# Patient Record
Sex: Female | Born: 1950 | Race: White | Hispanic: No | Smoking: Former smoker
Health system: Southern US, Community
[De-identification: ages and names within clinical notes are randomized; demographics above are authoritative.]

## PROBLEM LIST (undated history)

## (undated) DIAGNOSIS — I2510 Atherosclerotic heart disease of native coronary artery without angina pectoris: Secondary | ICD-10-CM

## (undated) DIAGNOSIS — I480 Paroxysmal atrial fibrillation: Secondary | ICD-10-CM

## (undated) DIAGNOSIS — I219 Acute myocardial infarction, unspecified: Secondary | ICD-10-CM

## (undated) DIAGNOSIS — G4733 Obstructive sleep apnea (adult) (pediatric): Secondary | ICD-10-CM

## (undated) DIAGNOSIS — R7302 Impaired glucose tolerance (oral): Secondary | ICD-10-CM

## (undated) DIAGNOSIS — I739 Peripheral vascular disease, unspecified: Secondary | ICD-10-CM

## (undated) DIAGNOSIS — M545 Low back pain, unspecified: Secondary | ICD-10-CM

## (undated) DIAGNOSIS — G8929 Other chronic pain: Secondary | ICD-10-CM

## (undated) DIAGNOSIS — R519 Headache, unspecified: Secondary | ICD-10-CM

## (undated) DIAGNOSIS — I82409 Acute embolism and thrombosis of unspecified deep veins of unspecified lower extremity: Secondary | ICD-10-CM

## (undated) DIAGNOSIS — J45909 Unspecified asthma, uncomplicated: Secondary | ICD-10-CM

## (undated) DIAGNOSIS — G629 Polyneuropathy, unspecified: Secondary | ICD-10-CM

## (undated) DIAGNOSIS — M549 Dorsalgia, unspecified: Secondary | ICD-10-CM

## (undated) DIAGNOSIS — R51 Headache: Secondary | ICD-10-CM

## (undated) DIAGNOSIS — I10 Essential (primary) hypertension: Secondary | ICD-10-CM

## (undated) DIAGNOSIS — R0602 Shortness of breath: Secondary | ICD-10-CM

## (undated) DIAGNOSIS — J449 Chronic obstructive pulmonary disease, unspecified: Secondary | ICD-10-CM

## (undated) DIAGNOSIS — M79606 Pain in leg, unspecified: Secondary | ICD-10-CM

## (undated) DIAGNOSIS — F32A Depression, unspecified: Secondary | ICD-10-CM

## (undated) DIAGNOSIS — W19XXXA Unspecified fall, initial encounter: Secondary | ICD-10-CM

## (undated) DIAGNOSIS — F329 Major depressive disorder, single episode, unspecified: Secondary | ICD-10-CM

## (undated) DIAGNOSIS — I209 Angina pectoris, unspecified: Secondary | ICD-10-CM

## (undated) DIAGNOSIS — Z9989 Dependence on other enabling machines and devices: Secondary | ICD-10-CM

## (undated) DIAGNOSIS — I5042 Chronic combined systolic (congestive) and diastolic (congestive) heart failure: Secondary | ICD-10-CM

## (undated) DIAGNOSIS — D649 Anemia, unspecified: Secondary | ICD-10-CM

## (undated) DIAGNOSIS — Z973 Presence of spectacles and contact lenses: Secondary | ICD-10-CM

## (undated) DIAGNOSIS — J189 Pneumonia, unspecified organism: Secondary | ICD-10-CM

## (undated) DIAGNOSIS — K219 Gastro-esophageal reflux disease without esophagitis: Secondary | ICD-10-CM

## (undated) DIAGNOSIS — E785 Hyperlipidemia, unspecified: Secondary | ICD-10-CM

## (undated) DIAGNOSIS — F419 Anxiety disorder, unspecified: Secondary | ICD-10-CM

## (undated) DIAGNOSIS — M1990 Unspecified osteoarthritis, unspecified site: Secondary | ICD-10-CM

## (undated) HISTORY — PX: JOINT REPLACEMENT: SHX530

## (undated) HISTORY — PX: FINGER SURGERY: SHX640

## (undated) HISTORY — PX: BACK SURGERY: SHX140

## (undated) HISTORY — PX: THROMBECTOMY / EMBOLECTOMY FEMORAL ARTERY: SUR1353

## (undated) HISTORY — PX: FRACTURE SURGERY: SHX138

## (undated) HISTORY — DX: Chronic combined systolic (congestive) and diastolic (congestive) heart failure: I50.42

## (undated) HISTORY — PX: CORONARY ANGIOPLASTY WITH STENT PLACEMENT: SHX49

---

## 1989-03-07 HISTORY — PX: ANTERIOR CERVICAL DECOMP/DISCECTOMY FUSION: SHX1161

## 1990-03-07 HISTORY — PX: POSTERIOR FUSION CERVICAL SPINE: SUR628

## 1996-03-07 HISTORY — PX: CORONARY ARTERY BYPASS GRAFT: SHX141

## 1996-03-07 HISTORY — PX: KNEE ARTHROSCOPY: SUR90

## 1997-03-07 DIAGNOSIS — I219 Acute myocardial infarction, unspecified: Secondary | ICD-10-CM

## 1997-03-07 HISTORY — DX: Acute myocardial infarction, unspecified: I21.9

## 1997-06-29 ENCOUNTER — Emergency Department (HOSPITAL_COMMUNITY): Admission: EM | Admit: 1997-06-29 | Discharge: 1997-06-29 | Payer: Self-pay | Admitting: Emergency Medicine

## 1998-01-27 ENCOUNTER — Encounter: Payer: Self-pay | Admitting: Vascular Surgery

## 1998-01-27 ENCOUNTER — Inpatient Hospital Stay: Admission: EM | Admit: 1998-01-27 | Discharge: 1998-01-30 | Payer: Self-pay | Admitting: Emergency Medicine

## 1998-01-28 ENCOUNTER — Encounter: Payer: Self-pay | Admitting: Vascular Surgery

## 1998-06-29 ENCOUNTER — Emergency Department (HOSPITAL_COMMUNITY): Admission: EM | Admit: 1998-06-29 | Discharge: 1998-06-29 | Payer: Self-pay | Admitting: *Deleted

## 1998-09-11 ENCOUNTER — Emergency Department (HOSPITAL_COMMUNITY): Admission: EM | Admit: 1998-09-11 | Discharge: 1998-09-11 | Payer: Self-pay | Admitting: Emergency Medicine

## 1998-09-11 ENCOUNTER — Encounter: Payer: Self-pay | Admitting: Emergency Medicine

## 1998-10-26 ENCOUNTER — Encounter: Payer: Self-pay | Admitting: Emergency Medicine

## 1998-10-26 ENCOUNTER — Emergency Department (HOSPITAL_COMMUNITY): Admission: EM | Admit: 1998-10-26 | Discharge: 1998-10-26 | Payer: Self-pay | Admitting: Emergency Medicine

## 1999-07-31 ENCOUNTER — Emergency Department (HOSPITAL_COMMUNITY): Admission: EM | Admit: 1999-07-31 | Discharge: 1999-07-31 | Payer: Self-pay | Admitting: Emergency Medicine

## 2000-03-08 ENCOUNTER — Inpatient Hospital Stay (HOSPITAL_COMMUNITY): Admission: EM | Admit: 2000-03-08 | Discharge: 2000-03-09 | Payer: Self-pay

## 2000-03-08 ENCOUNTER — Encounter: Payer: Self-pay | Admitting: Cardiology

## 2000-03-09 HISTORY — PX: CARDIAC CATHETERIZATION: SHX172

## 2000-05-09 ENCOUNTER — Emergency Department (HOSPITAL_COMMUNITY): Admission: EM | Admit: 2000-05-09 | Discharge: 2000-05-09 | Payer: Self-pay | Admitting: Emergency Medicine

## 2000-06-23 ENCOUNTER — Ambulatory Visit (HOSPITAL_COMMUNITY): Admission: RE | Admit: 2000-06-23 | Discharge: 2000-06-23 | Payer: Self-pay | Admitting: Family Medicine

## 2000-06-29 ENCOUNTER — Encounter: Payer: Self-pay | Admitting: Family Medicine

## 2000-06-29 ENCOUNTER — Encounter: Admission: RE | Admit: 2000-06-29 | Discharge: 2000-06-29 | Payer: Self-pay | Admitting: Family Medicine

## 2000-12-19 ENCOUNTER — Encounter: Admission: RE | Admit: 2000-12-19 | Discharge: 2000-12-19 | Payer: Self-pay | Admitting: Family Medicine

## 2000-12-19 ENCOUNTER — Encounter: Payer: Self-pay | Admitting: Family Medicine

## 2000-12-27 ENCOUNTER — Emergency Department (HOSPITAL_COMMUNITY): Admission: EM | Admit: 2000-12-27 | Discharge: 2000-12-28 | Payer: Self-pay | Admitting: Emergency Medicine

## 2001-01-26 ENCOUNTER — Ambulatory Visit (HOSPITAL_COMMUNITY): Admission: RE | Admit: 2001-01-26 | Discharge: 2001-01-26 | Payer: Self-pay | Admitting: *Deleted

## 2001-01-26 ENCOUNTER — Encounter: Payer: Self-pay | Admitting: *Deleted

## 2001-07-09 ENCOUNTER — Encounter: Payer: Self-pay | Admitting: Emergency Medicine

## 2001-07-09 ENCOUNTER — Emergency Department (HOSPITAL_COMMUNITY): Admission: EM | Admit: 2001-07-09 | Discharge: 2001-07-09 | Payer: Self-pay | Admitting: Emergency Medicine

## 2001-07-10 ENCOUNTER — Emergency Department (HOSPITAL_COMMUNITY): Admission: EM | Admit: 2001-07-10 | Discharge: 2001-07-10 | Payer: Self-pay | Admitting: Emergency Medicine

## 2001-07-10 ENCOUNTER — Encounter: Payer: Self-pay | Admitting: Emergency Medicine

## 2001-07-17 ENCOUNTER — Encounter: Admission: RE | Admit: 2001-07-17 | Discharge: 2001-07-17 | Payer: Self-pay | Admitting: Family Medicine

## 2001-07-17 ENCOUNTER — Encounter: Payer: Self-pay | Admitting: Family Medicine

## 2002-02-02 ENCOUNTER — Encounter: Payer: Self-pay | Admitting: Emergency Medicine

## 2002-02-02 ENCOUNTER — Emergency Department (HOSPITAL_COMMUNITY): Admission: EM | Admit: 2002-02-02 | Discharge: 2002-02-02 | Payer: Self-pay | Admitting: Emergency Medicine

## 2002-02-03 ENCOUNTER — Emergency Department (HOSPITAL_COMMUNITY): Admission: EM | Admit: 2002-02-03 | Discharge: 2002-02-03 | Payer: Self-pay | Admitting: Emergency Medicine

## 2002-11-02 ENCOUNTER — Emergency Department (HOSPITAL_COMMUNITY): Admission: EM | Admit: 2002-11-02 | Discharge: 2002-11-02 | Payer: Self-pay | Admitting: Emergency Medicine

## 2002-11-02 ENCOUNTER — Encounter: Payer: Self-pay | Admitting: Emergency Medicine

## 2004-10-27 ENCOUNTER — Emergency Department (HOSPITAL_COMMUNITY): Admission: EM | Admit: 2004-10-27 | Discharge: 2004-10-27 | Payer: Self-pay | Admitting: Family Medicine

## 2005-09-24 ENCOUNTER — Emergency Department (HOSPITAL_COMMUNITY): Admission: AD | Admit: 2005-09-24 | Discharge: 2005-09-24 | Payer: Self-pay | Admitting: Family Medicine

## 2008-09-01 ENCOUNTER — Emergency Department (HOSPITAL_COMMUNITY): Admission: EM | Admit: 2008-09-01 | Discharge: 2008-09-01 | Payer: Self-pay | Admitting: Family Medicine

## 2010-07-23 NOTE — Cardiovascular Report (Signed)
[place]. [facility]  Patient:    Wendy [name], Wendy [name]                     MRN: [id number] Proc. Date: 03/09/00 Adm. Date:  [id number] Attending:  Ophelia Shoulder CC:         Madaline Savage, M.D.   Cardiac Catheterization  PROCEDURES: 1. Left heart catheterization. 2. Coronary angiography. 3. Left ventriculogram. 4. Saphenous vein grafts. 5. Left internal mammary angiography. 6. Ascending aortography.  COMPLICATIONS:  None.  INDICATIONS:  Ms. Panameno is a 60 year old white female, patient of Dr. Lavonne Chick, with a history of extensive CAD, status post coronary artery bypass graft surgery in July of 1998, consisting of a LIMA to LAD, vein graft to OM, and vein graft to distal RCA.  The patient has a history of extensive tobacco abuse and polysubstance abuse.  She was readmitted with chest pain. She is now referred for repeat cardiac catheterization.  DESCRIPTION OF PROCEDURE:  After given informed written consent, the patient was brought to the cardiac catheterization lab where her right and left groins were shaved, prepped, and draped in the usual sterile fashion.  ECG monitoring was established.  Using modified Seldinger technique, a #6 French arterial sheath was inserted into the left femoral artery.  A 6 French diagnostic catheter was then used to perform diagnostic angiography.  This reveals medium sized left main with no significant disease.  The LAD is a medium sized vessel which coursed to the apex and gave rise to one diagonal branch.  The LAD is noted to be coarsely irregular throughout its proximal and mid segments with up to 50% proximal stenosis.  There was a good antegrade flow down the LAD in the diagonal.  The mid and distal LAD also fill via a small but patent LIMA which inserts into the midportion of the LAD. There is 50% ostial lesion in the takeoff of the IMA from the left subclavian. The first diagonal is a medium sized  vessel which bifurcates in its mid segment and has no significant disease.  The left circumflex is a medium sized vessel which coursed in the AV groove and gave rise to two obtuse marginal branches.  The AV groove circumflex is totally occluded in its distal portion.  The first and third OM were small vessel with no significant disease.  The second OM fills a patent saphenous vein graft which has no significant disease in the body of the graft or distal to the graft insertion.  The right coronary artery is filled via patent saphenous vein graft to the distal RCA.  There is 30% proximal vein graft lesion but no further significant disease in the body of the graft or distal to the graft insertion. The RCA fills retrograde into its mid and proximal portion with diffuse disease.  The RCA proper is not directly cannulized as it has aberrant takeoff from the left coronary cusp.  LEFT VENTRICULOGRAM:  Left ventriculogram reveals preserved EF estimated at 70%.  ASCENDING AORTOGRAPHY:  Ascending aortography reveals no evidence of significant aortic regurgitation.  There appear to be two patent grafts.  The RCA is nonselectively visualized.  HEMODYNAMICS:  Systemic arterial pressure 157/80, LV systemic pressure 157/18, LVEDP of 22.  CONCLUSIONS: 1. Significant two-vessel coronary artery disease. 2. Patent left internal mammary artery to the left anterior descending with a    50% ostial internal mammary artery lesion off the left subclavian.  The    internal  mammary artery is a small vessel but does fill the left anterior    descending. 3. Patent saphenous vein graft to second obtuse marginal. 4. Patent saphenous vein graft to right coronary artery. 5. Normal left ventricular systolic function. 6. No evidence of aortic regurgitation. DD:  03/09/00 TD:  03/09/00 Job: [id number]

## 2010-07-23 NOTE — Discharge Summary (Signed)
[place]. [facility]  Patient:    DANAYE, SOBH                     MRN: [id number] Adm. Date:  [id number] Disc. Date: [id number] Attending:  Ophelia Shoulder Dictator:   Mancel Bale, P.A. CC:         Larina Earthly, M.D.   Discharge Summary  ADMISSION DIAGNOSES: 1. Chest pain, questionable etiology. 2. Coronary artery disease with history of coronary artery bypass grafting    July 1998. 3. Peripheral vascular disease.    a. Status post acute occlusion right iliac.    b. Status post right femoral thrombectomy.    c. History of thin bypass graft. 4. History of heavy tobacco use. 5. History of elicit drug abuse. 6. History of seizure disorder. 7. History of personality disorder.  DISCHARGE DIAGNOSES: 1. Status post cardiac catheterization March 09, 2000 by Dr. Lenise Herald    revealing an ejection fraction of 70%, significant native coronary artery    disease but essentially patent bypass graft for medical management. 2. Chest pain, questionable etiology. 3. Coronary artery disease with history of coronary artery bypass grafting    July 1998. 4. Peripheral vascular disease.    a. Status post acute occlusion right iliac.    b. Status post right femoral thrombectomy.    c. History of thin bypass graft. 5. History of heavy tobacco use. 6. History of elicit drug abuse. 7. History of seizure disorder. 8. History of personality disorder.  HISTORY OF PRESENT ILLNESS: Ms. Musquiz is a 60 year old white female with a history of coronary artery disease status post coronary artery bypass grafting July 1998, peripheral vascular disease status post acute occlusion right iliac, status post right femoral thrombectomy, and a history of fem fem bypass grafting who presented on [facility] Room on March 08, 2000 with complaints of chest pain. She was a remote patient of Dr. Elsie Lincoln but had seen no cardiology followup and no primary care  followup and was on no medications at the time of presentation.  She stated that she woke up at 3:00 a.m. with chest pain and then went back to sleep but woke again at which time she had no chest pain.  However, 30 minutes after waking back up, the chest pain returned.  It was described as a tightness in her left chest.  On exam at that time, her exam was essentially benign and she was stable with a heart rate of 75, blood pressure 117/51.  EKG showed normal sinus rhythm, possible old anterior MI, nonspecific ST abnormalities.  Initial CK was 56, troponin 0.01 which was negative.  At that time, it was felt that she was experiencing chest pain that may be cardiac in etiology.  Will plan to admit her to Telemetry, rule out MI with serial enzymes, place her on aspirin, Plavix, IV Heparin, and IV Nitroglycerin as well as metoprolol 12.5 mg b.i.d.  We will plan for cardiac catheterization the following day.  HOSPITAL COURSE: On March 09, 2000, Ms. Mazo underwent cardiac catheterization by Dr. Lenise Herald.  She was found to have significant two vessel coronary artery disease.  Patent LIMA to the LAD with a 50% osteal LIMA lesion off the left subclavian.  The internal mammary artery was small vessel but does fill to the left anterior descending. Patent saphenous vein graft to second obtuse marginal.  Pace at patent saphenous vein graft to right coronary artery.  Normal  left ventricular systolic function.  No evidence of aortic regurgitation.  She was planned for medical management and discharge home later that evening if her groin was stable.  She was later discharged home later that evening as her groin was stable and she was hemodynamically stable.  HOSPITAL CONSULTS: None.  HOSPITAL PROCEDURES: Cardiac catheterization on March 09, 2000 by Dr. Lenise Herald revealing: 1. Significant two vessel coronary artery disease. 2. Patent left internal mammary artery to the left anterior  descending with    a 50% osteal internal mammary artery lesion off the left subclavian.    The internal mammary artery is a small vessel but does fill the    left anterior descending. 3. Patent saphenous vein graft to second obtuse marginal. 4. Patent saphenous vein graft to right coronary artery. 5. Normal left ventricular systolic function. 6. No evidence of aortic regurgitation.  HOSPITAL LABORATORIES:  Alcohol level was less than 10.  Cardiac enzymes with left CKF 56, 53, and 50.  MB 1.0, 0.7, 0.7.  Troponin is 0.01 x 3.  On admission, PT 12.1, INR 0.9, PTT 28.  Metabolic profile is normal with sodium 140, potassium 3.9, glucose 80, BUN 15, creatinine 0.6, LFTs normal with AST 19, ALT 17, ALP 63, total bilirubin 0.6.  CBC normal with WBC 6.4, hemoglobin 12.6, hematocrit 38.3, platelets 223,000.  ACCESSORY DATA:  EKG on admission showed normal sinus rhythm, possible old anterior MI, nonspecific ST-T abnormalities.  DISCHARGE MEDICATIONS: 1. Enteric coated aspirin 325 mg once a day. 2. Atenolol 25 mg once a day. 3. Nitroglycerin 0.4 mg sublingual as directed.  ACTIVITY:  No lifting greater than five pounds, no traveling or sexual activity for three days.  DIET:  Low salt, low fat.  WOUND CARE:  Wash with warm water and soap.  DISCHARGE INSTRUCTIONS:  Call our office at [phone] if any bleeding or increased size or pain of the groin.  FOLLOWUP:  She was given an appointment to follow-up with Dr. Elsie Lincoln in the office January 17 at 9:00 a.m. DD:  03/20/00 TD:  03/20/00 Job: [id number]

## 2013-07-19 ENCOUNTER — Encounter (HOSPITAL_COMMUNITY): Payer: Self-pay | Admitting: Emergency Medicine

## 2013-07-19 ENCOUNTER — Emergency Department (HOSPITAL_COMMUNITY): Payer: Medicare Other

## 2013-07-19 ENCOUNTER — Inpatient Hospital Stay (HOSPITAL_COMMUNITY)
Admission: EM | Admit: 2013-07-19 | Discharge: 2013-07-20 | DRG: 287 | Disposition: A | Discharge status: Home / Self Care | Payer: Medicare Other | Attending: Cardiology | Admitting: Cardiology

## 2013-07-19 ENCOUNTER — Encounter (HOSPITAL_COMMUNITY)
Admission: EM | Disposition: A | Discharge status: Home / Self Care | Payer: Self-pay | Source: Home / Self Care | Attending: Cardiology

## 2013-07-19 DIAGNOSIS — I739 Peripheral vascular disease, unspecified: Secondary | ICD-10-CM | POA: Diagnosis present

## 2013-07-19 DIAGNOSIS — I2510 Atherosclerotic heart disease of native coronary artery without angina pectoris: Principal | ICD-10-CM | POA: Diagnosis present

## 2013-07-19 DIAGNOSIS — E785 Hyperlipidemia, unspecified: Secondary | ICD-10-CM | POA: Diagnosis present

## 2013-07-19 DIAGNOSIS — R7309 Other abnormal glucose: Secondary | ICD-10-CM | POA: Diagnosis present

## 2013-07-19 DIAGNOSIS — Z9861 Coronary angioplasty status: Secondary | ICD-10-CM

## 2013-07-19 DIAGNOSIS — F410 Panic disorder [episodic paroxysmal anxiety] without agoraphobia: Secondary | ICD-10-CM | POA: Diagnosis present

## 2013-07-19 DIAGNOSIS — R079 Chest pain, unspecified: Secondary | ICD-10-CM

## 2013-07-19 DIAGNOSIS — I10 Essential (primary) hypertension: Secondary | ICD-10-CM | POA: Diagnosis present

## 2013-07-19 DIAGNOSIS — Z888 Allergy status to other drugs, medicaments and biological substances status: Secondary | ICD-10-CM

## 2013-07-19 DIAGNOSIS — Z951 Presence of aortocoronary bypass graft: Secondary | ICD-10-CM

## 2013-07-19 DIAGNOSIS — G8929 Other chronic pain: Secondary | ICD-10-CM | POA: Diagnosis present

## 2013-07-19 DIAGNOSIS — E669 Obesity, unspecified: Secondary | ICD-10-CM

## 2013-07-19 DIAGNOSIS — Z7982 Long term (current) use of aspirin: Secondary | ICD-10-CM

## 2013-07-19 DIAGNOSIS — I200 Unstable angina: Secondary | ICD-10-CM | POA: Diagnosis present

## 2013-07-19 DIAGNOSIS — I2582 Chronic total occlusion of coronary artery: Secondary | ICD-10-CM | POA: Diagnosis present

## 2013-07-19 DIAGNOSIS — J449 Chronic obstructive pulmonary disease, unspecified: Secondary | ICD-10-CM | POA: Diagnosis present

## 2013-07-19 DIAGNOSIS — J4489 Other specified chronic obstructive pulmonary disease: Secondary | ICD-10-CM | POA: Diagnosis present

## 2013-07-19 DIAGNOSIS — Z6838 Body mass index (BMI) 38.0-38.9, adult: Secondary | ICD-10-CM

## 2013-07-19 DIAGNOSIS — Z7902 Long term (current) use of antithrombotics/antiplatelets: Secondary | ICD-10-CM

## 2013-07-19 DIAGNOSIS — S31109A Unspecified open wound of abdominal wall, unspecified quadrant without penetration into peritoneal cavity, initial encounter: Secondary | ICD-10-CM

## 2013-07-19 DIAGNOSIS — Z881 Allergy status to other antibiotic agents status: Secondary | ICD-10-CM

## 2013-07-19 DIAGNOSIS — Z87891 Personal history of nicotine dependence: Secondary | ICD-10-CM

## 2013-07-19 DIAGNOSIS — M79609 Pain in unspecified limb: Secondary | ICD-10-CM | POA: Diagnosis present

## 2013-07-19 DIAGNOSIS — M549 Dorsalgia, unspecified: Secondary | ICD-10-CM | POA: Diagnosis present

## 2013-07-19 DIAGNOSIS — G609 Hereditary and idiopathic neuropathy, unspecified: Secondary | ICD-10-CM | POA: Diagnosis present

## 2013-07-19 HISTORY — DX: Hyperlipidemia, unspecified: E78.5

## 2013-07-19 HISTORY — DX: Other chronic pain: G89.29

## 2013-07-19 HISTORY — PX: LEFT HEART CATHETERIZATION WITH CORONARY/GRAFT ANGIOGRAM: SHX5450

## 2013-07-19 HISTORY — DX: Essential (primary) hypertension: I10

## 2013-07-19 HISTORY — DX: Dorsalgia, unspecified: M54.9

## 2013-07-19 HISTORY — DX: Polyneuropathy, unspecified: G62.9

## 2013-07-19 HISTORY — DX: Pain in leg, unspecified: M79.606

## 2013-07-19 HISTORY — DX: Chronic obstructive pulmonary disease, unspecified: J44.9

## 2013-07-19 HISTORY — DX: Impaired glucose tolerance (oral): R73.02

## 2013-07-19 HISTORY — DX: Atherosclerotic heart disease of native coronary artery without angina pectoris: I25.10

## 2013-07-19 HISTORY — DX: Peripheral vascular disease, unspecified: I73.9

## 2013-07-19 LAB — CBC
HCT: 39.5 % (ref 36.0–46.0)
Hemoglobin: 12.9 g/dL (ref 12.0–15.0)
MCH: 30.6 pg (ref 26.0–34.0)
MCHC: 32.7 g/dL (ref 30.0–36.0)
MCV: 93.6 fL (ref 78.0–100.0)
Platelets: 165 10*3/uL (ref 150–400)
RBC: 4.22 MIL/uL (ref 3.87–5.11)
RDW: 14.6 % (ref 11.5–15.5)
WBC: 5.7 10*3/uL (ref 4.0–10.5)

## 2013-07-19 LAB — URINALYSIS, ROUTINE W REFLEX MICROSCOPIC
Bilirubin Urine: NEGATIVE
Glucose, UA: NEGATIVE mg/dL
Hgb urine dipstick: NEGATIVE
Ketones, ur: NEGATIVE mg/dL
Nitrite: POSITIVE — AB
Protein, ur: NEGATIVE mg/dL
Specific Gravity, Urine: 1.024 (ref 1.005–1.030)
Urobilinogen, UA: 0.2 mg/dL (ref 0.0–1.0)
pH: 5.5 (ref 5.0–8.0)

## 2013-07-19 LAB — COMPREHENSIVE METABOLIC PANEL
ALT: 36 U/L — ABNORMAL HIGH (ref 0–35)
AST: 34 U/L (ref 0–37)
Albumin: 3.5 g/dL (ref 3.5–5.2)
Alkaline Phosphatase: 62 U/L (ref 39–117)
BUN: 18 mg/dL (ref 6–23)
CO2: 26 mEq/L (ref 19–32)
Calcium: 9.4 mg/dL (ref 8.4–10.5)
Chloride: 103 mEq/L (ref 96–112)
Creatinine, Ser: 0.92 mg/dL (ref 0.50–1.10)
GFR calc Af Amer: 76 mL/min — ABNORMAL LOW (ref >90)
GFR calc non Af Amer: 66 mL/min — ABNORMAL LOW (ref >90)
Glucose, Bld: 109 mg/dL — ABNORMAL HIGH (ref 70–99)
Potassium: 4.2 mEq/L (ref 3.7–5.3)
Sodium: 141 mEq/L (ref 137–147)
Total Bilirubin: <0.2 mg/dL — ABNORMAL LOW (ref 0.3–1.2)
Total Protein: 6.8 g/dL (ref 6.0–8.3)

## 2013-07-19 LAB — PROTIME-INR
INR: 0.92 (ref 0.00–1.49)
Prothrombin Time: 12.2 seconds (ref 11.6–15.2)

## 2013-07-19 LAB — PLATELET INHIBITION P2Y12: Platelet Function  P2Y12: 213 [PRU] (ref 194–418)

## 2013-07-19 LAB — TROPONIN I: Troponin I: <0.3 ng/mL (ref <0.30)

## 2013-07-19 LAB — URINE MICROSCOPIC-ADD ON

## 2013-07-19 LAB — APTT: aPTT: 27 seconds (ref 24–37)

## 2013-07-19 SURGERY — LEFT HEART CATHETERIZATION WITH CORONARY/GRAFT ANGIOGRAM
Anesthesia: LOCAL

## 2013-07-19 MED ORDER — NITROGLYCERIN 0.4 MG SL SUBL
0.4000 mg | SUBLINGUAL_TABLET | SUBLINGUAL | Status: DC | PRN
  Administered 2013-07-19 (×2): 0.4 mg via SUBLINGUAL

## 2013-07-19 MED ORDER — VERAPAMIL HCL 2.5 MG/ML IV SOLN
INTRAVENOUS | Status: AC
  Filled 2013-07-19: qty 2

## 2013-07-19 MED ORDER — FENTANYL CITRATE 0.05 MG/ML IJ SOLN
INTRAMUSCULAR | Status: AC
  Filled 2013-07-19: qty 2

## 2013-07-19 MED ORDER — MOMETASONE FURO-FORMOTEROL FUM 100-5 MCG/ACT IN AERO
2.0000 | INHALATION_SPRAY | Freq: Two times a day (BID) | RESPIRATORY_TRACT | Status: DC
  Administered 2013-07-20: 2 via RESPIRATORY_TRACT
  Filled 2013-07-19 (×2): qty 8.8

## 2013-07-19 MED ORDER — SODIUM CHLORIDE 0.9 % IV SOLN
20.0000 mL | INTRAVENOUS | Status: DC
  Administered 2013-07-19: 20 mL via INTRAVENOUS

## 2013-07-19 MED ORDER — NITROGLYCERIN 0.4 MG SL SUBL: 0.4000 mg | SUBLINGUAL_TABLET | SUBLINGUAL | Status: DC | PRN

## 2013-07-19 MED ORDER — MIDAZOLAM HCL 2 MG/2ML IJ SOLN
INTRAMUSCULAR | Status: AC
  Filled 2013-07-19: qty 2

## 2013-07-19 MED ORDER — OXYCODONE-ACETAMINOPHEN 5-325 MG PO TABS
1.0000 | ORAL_TABLET | Freq: Once | ORAL | Status: AC
  Administered 2013-07-19: 21:00:00 1 via ORAL
  Filled 2013-07-19: qty 1

## 2013-07-19 MED ORDER — ASPIRIN 81 MG PO CHEW
324.0000 mg | CHEWABLE_TABLET | Freq: Once | ORAL | Status: AC
  Administered 2013-07-19: 324 mg via ORAL
  Filled 2013-07-19: qty 4

## 2013-07-19 MED ORDER — ALPRAZOLAM 0.5 MG PO TABS: 0.5000 mg | ORAL_TABLET | Freq: Three times a day (TID) | ORAL | Status: DC | PRN

## 2013-07-19 MED ORDER — SODIUM CHLORIDE 0.9 % IJ SOLN: 3.0000 mL | Freq: Two times a day (BID) | INTRAMUSCULAR | Status: DC

## 2013-07-19 MED ORDER — METOPROLOL SUCCINATE ER 100 MG PO TB24
100.0000 mg | ORAL_TABLET | Freq: Two times a day (BID) | ORAL | Status: DC
  Administered 2013-07-20: 09:00:00 100 mg via ORAL
  Filled 2013-07-19 (×3): qty 1

## 2013-07-19 MED ORDER — SODIUM CHLORIDE 0.9 % IV SOLN: 250.0000 mL | INTRAVENOUS | Status: DC | PRN

## 2013-07-19 MED ORDER — CLOPIDOGREL BISULFATE 75 MG PO TABS
75.0000 mg | ORAL_TABLET | Freq: Every day | ORAL | Status: DC
  Administered 2013-07-20: 08:00:00 75 mg via ORAL
  Filled 2013-07-19: qty 1

## 2013-07-19 MED ORDER — HEPARIN SODIUM (PORCINE) 5000 UNIT/ML IJ SOLN
5000.0000 [IU] | Freq: Three times a day (TID) | INTRAMUSCULAR | Status: DC
  Administered 2013-07-19 – 2013-07-20 (×2): 5000 [IU] via SUBCUTANEOUS
  Filled 2013-07-19 (×5): qty 1

## 2013-07-19 MED ORDER — ASPIRIN 81 MG PO CHEW
81.0000 mg | CHEWABLE_TABLET | Freq: Every day | ORAL | Status: DC
  Administered 2013-07-20: 09:00:00 81 mg via ORAL
  Filled 2013-07-19: qty 1

## 2013-07-19 MED ORDER — NITROGLYCERIN IN D5W 200-5 MCG/ML-% IV SOLN
5.0000 ug/min | INTRAVENOUS | Status: DC
  Administered 2013-07-19: 10 ug/min via INTRAVENOUS
  Filled 2013-07-19: qty 250

## 2013-07-19 MED ORDER — SODIUM CHLORIDE 0.9 % IV SOLN
INTRAVENOUS | Status: DC
  Administered 2013-07-19: 18:00:00 via INTRAVENOUS

## 2013-07-19 MED ORDER — ONDANSETRON HCL 4 MG/2ML IJ SOLN: 4.0000 mg | Freq: Four times a day (QID) | INTRAMUSCULAR | Status: DC | PRN

## 2013-07-19 MED ORDER — HYDROCODONE-ACETAMINOPHEN 5-325 MG PO TABS: 1.0000 | ORAL_TABLET | Freq: Four times a day (QID) | ORAL | Status: DC | PRN

## 2013-07-19 MED ORDER — TRIAMTERENE-HCTZ 37.5-25 MG PO CAPS
1.0000 | ORAL_CAPSULE | Freq: Every day | ORAL | Status: DC
  Administered 2013-07-20: 09:00:00 1 via ORAL
  Filled 2013-07-19 (×2): qty 1

## 2013-07-19 MED ORDER — SODIUM CHLORIDE 0.9 % IJ SOLN: 3.0000 mL | INTRAMUSCULAR | Status: DC | PRN

## 2013-07-19 MED ORDER — SIMVASTATIN 20 MG PO TABS
20.0000 mg | ORAL_TABLET | Freq: Every day | ORAL | Status: DC
  Administered 2013-07-19 – 2013-07-20 (×2): 20 mg via ORAL
  Filled 2013-07-19 (×2): qty 1

## 2013-07-19 MED ORDER — ALPRAZOLAM 0.25 MG PO TABS: 0.2500 mg | ORAL_TABLET | Freq: Two times a day (BID) | ORAL | Status: DC | PRN

## 2013-07-19 MED ORDER — NITROGLYCERIN 0.2 MG/ML ON CALL CATH LAB
INTRAVENOUS | Status: AC
  Filled 2013-07-19: qty 1

## 2013-07-19 MED ORDER — HEPARIN (PORCINE) IN NACL 2-0.9 UNIT/ML-% IJ SOLN
INTRAMUSCULAR | Status: AC
  Filled 2013-07-19: qty 1500

## 2013-07-19 MED ORDER — ALBUTEROL SULFATE HFA 108 (90 BASE) MCG/ACT IN AERS: 2.0000 | INHALATION_SPRAY | Freq: Four times a day (QID) | RESPIRATORY_TRACT | Status: DC | PRN

## 2013-07-19 MED ORDER — ZOLPIDEM TARTRATE 5 MG PO TABS: 5.0000 mg | ORAL_TABLET | Freq: Every evening | ORAL | Status: DC | PRN

## 2013-07-19 MED ORDER — CLOPIDOGREL BISULFATE 75 MG PO TABS
75.0000 mg | ORAL_TABLET | Freq: Once | ORAL | Status: AC
  Administered 2013-07-19: 75 mg via ORAL
  Filled 2013-07-19: qty 1

## 2013-07-19 MED ORDER — SODIUM CHLORIDE 0.9 % IJ SOLN
3.0000 mL | Freq: Two times a day (BID) | INTRAMUSCULAR | Status: DC
  Administered 2013-07-19: 3 mL via INTRAVENOUS

## 2013-07-19 MED ORDER — ALBUTEROL SULFATE (2.5 MG/3ML) 0.083% IN NEBU: 2.5000 mg | INHALATION_SOLUTION | Freq: Four times a day (QID) | RESPIRATORY_TRACT | Status: DC | PRN

## 2013-07-19 MED ORDER — PANTOPRAZOLE SODIUM 40 MG PO TBEC
40.0000 mg | DELAYED_RELEASE_TABLET | Freq: Every day | ORAL | Status: DC
  Administered 2013-07-20: 09:00:00 40 mg via ORAL
  Filled 2013-07-19: qty 1

## 2013-07-19 MED ORDER — LIDOCAINE HCL (PF) 1 % IJ SOLN
INTRAMUSCULAR | Status: AC
  Filled 2013-07-19: qty 30

## 2013-07-19 MED ORDER — OMEGA-3-ACID ETHYL ESTERS 1 G PO CAPS
1.0000 g | ORAL_CAPSULE | Freq: Every day | ORAL | Status: DC
  Administered 2013-07-19 – 2013-07-20 (×2): 1 g via ORAL
  Filled 2013-07-19 (×2): qty 1

## 2013-07-19 MED ORDER — VITAMIN D3 25 MCG (1000 UNIT) PO TABS
1000.0000 [IU] | ORAL_TABLET | Freq: Every day | ORAL | Status: DC
  Administered 2013-07-19 – 2013-07-20 (×2): 1000 [IU] via ORAL
  Filled 2013-07-19 (×2): qty 1

## 2013-07-19 MED ORDER — EZETIMIBE 10 MG PO TABS
10.0000 mg | ORAL_TABLET | Freq: Every day | ORAL | Status: DC
  Administered 2013-07-19 – 2013-07-20 (×2): 10 mg via ORAL
  Filled 2013-07-19 (×2): qty 1

## 2013-07-19 MED ORDER — MORPHINE SULFATE 4 MG/ML IJ SOLN
4.0000 mg | Freq: Once | INTRAMUSCULAR | Status: AC
  Administered 2013-07-19: 4 mg via INTRAVENOUS
  Filled 2013-07-19: qty 1

## 2013-07-19 MED ORDER — ACETAMINOPHEN 325 MG PO TABS: 650.0000 mg | ORAL_TABLET | ORAL | Status: DC | PRN

## 2013-07-19 MED ORDER — FISH OIL + D3 1200-1000 MG-UNIT PO CAPS: 1.0000 | ORAL_CAPSULE | Freq: Every day | ORAL | Status: DC

## 2013-07-19 NOTE — ED Provider Notes (Signed)
CSN: [id number]     Arrival date & time 07/19/13  1110 History   First MD Initiated Contact with Patient 07/19/13 1111     Chief Complaint  Patient presents with  . Chest Pain     (Consider location/radiation/quality/duration/timing/severity/associated sxs/prior Treatment) HPI 63 year old female with a history of coronary artery disease status post stenting and CABG presents today complaining of chest pain that is like previous MI. She states the pain awoke her at 955 this morning. It was substernal and radiating to her back. She took 3 nitroglycerin at home with pain decreased from 7-3. Pain has been waxing and waning. She also took 325 mg grams of aspirin at home. She was given one sublingual nitroglycerin by EMS. She states that her pain has been down to 3 but feels like it is coming back now. She has had some nausea but no diaphoresis, cough, or dyspnea. Her previous care was rendered in [facility]. She is somewhat new to the area and has not started with primary care. She states she is scheduled to see Dr. Tamala Julian for cardiology.  Past Medical History  Diagnosis Date  . Diabetes mellitus without complication     borderline diabetes  . Hypertension   . PAD (peripheral artery disease)   . COPD (chronic obstructive pulmonary disease)   . Peripheral neuropathy   . Chronic back pain   . Chronic leg pain    Past Surgical History  Procedure Laterality Date  . Cardiac surgery      quadruple bypass in 1998   History reviewed. No pertinent family history. History  Substance Use Topics  . Smoking status: Former Smoker    Quit date: 07/20/1998  . Smokeless tobacco: Never Used  . Alcohol Use: No   OB History   Grav Para Term Preterm Abortions TAB SAB Ect Mult Living                 Review of Systems  Musculoskeletal:       Left arm pain from recent fracture  All other systems reviewed and are negative.     Allergies  Benadryl; Amoxicillin; and Flexeril  Home  Medications   Prior to Admission medications   Not on File   BP 116/55  Pulse 73  Temp(Src) 97.9 F (36.6 C) (Oral)  Resp 18  SpO2 95% Physical Exam  Nursing note and vitals reviewed. Constitutional: She is oriented to person, place, and time. She appears well-developed and well-nourished.  Obese  HENT:  Head: Normocephalic and atraumatic.  Right Ear: External ear normal.  Left Ear: External ear normal.  Nose: Nose normal.  Mouth/Throat: Oropharynx is clear and moist.  Eyes: Conjunctivae and EOM are normal. Pupils are equal, round, and reactive to light.  Neck: Normal range of motion. Neck supple.  Cardiovascular: Normal rate, regular rhythm, normal heart sounds and intact distal pulses.   Pulmonary/Chest: Effort normal and breath sounds normal.  Abdominal: Soft. Bowel sounds are normal.  Musculoskeletal: Normal range of motion.  Neurological: She is alert and oriented to person, place, and time. She has normal reflexes.  Skin: Skin is warm and dry.  Psychiatric: She has a normal mood and affect. Her behavior is normal. Thought content normal.    ED Course  Procedures (including critical care time) Labs Review Labs Reviewed  APTT  CBC  COMPREHENSIVE METABOLIC PANEL  PROTIME-INR  URINALYSIS, ROUTINE W REFLEX MICROSCOPIC    Imaging Review No results found.   EKG Interpretation   Date/Time:  Friday Jul 19 2013 11:18:16 EDT Ventricular Rate:  74 PR Interval:  96 QRS Duration: 84 QT Interval:  418 QTC Calculation: 464 R Axis:   122 Text Interpretation:  Right and left arm electrode reversal,  interpretation assumes no reversal Sinus or ectopic atrial rhythm Short PR  interval Right axis deviation Abnormal lateral Q waves Confirmed by Austen Wygant  MD, Mauro Arps ([id number]) on 07/19/2013 11:35:20 AM     Repeat ekg due to arm electrode reversal  EKG Interpretation  Date/Time:  Friday Jul 19 2013 11:35:29 EDT Ventricular Rate:  73 PR Interval:  93 QRS Duration: 88 QT  Interval:  397 QTC Calculation: 437 R Axis:   51 Text Interpretation:  Sinus rhythm Short PR interval Nonspecific T abnormalities, lateral leads Confirmed by Leroi Haque MD, Mitchell Iwanicki ([id number]) on 07/19/2013 11:37:56 AM       MDM   Final diagnoses:  Intermediate coronary syndrome  Chest pain with low risk of acute coronary syndrome    Patient given repeat nitroglycerin here and has again had some increase in her chest pain. She is started on a nitro drip and pain is currently 2/10. I discussed the patient's care with cardiology and they will see and evaluate. Patient currently does not have ST elevation on EKG, first troponin is normal, and vital signs remained stable. She does have a history of known coronary artery disease and has nonspecific changes on her EKG with ongoing chest pain.    Shaune Pollack, MD 07/20/13 [phone]

## 2013-07-19 NOTE — Interval H&P Note (Signed)
History and Physical Interval Note:  07/19/2013 3:55 PM  Wendy [name]  has presented today for surgery, with the diagnosis of Unstable Angina The various methods of treatment have been discussed with the patient and family. After consideration of risks, benefits and other options for treatment, the patient has consented to  Procedure(s): LEFT HEART CATHETERIZATION WITH CORONARY/GRAFT ANGIOGRAM (N/A)  +/- PCI as a surgical intervention .  The patient's history has been reviewed, patient examined, no change in status, stable for surgery.  I have reviewed the patient's chart and labs.  Questions were answered to the patient's satisfaction.     Wendy [name]  Cath Lab Visit (complete for each Cath Lab visit)  Clinical Evaluation Leading to the Procedure:   ACS: yes  Non-ACS:    Anginal Classification: CCS III  Anti-ischemic medical therapy: Maximal Therapy (2 or more classes of medications)  Non-Invasive Test Results: No non-invasive testing performed  Prior CABG: Previous CABG

## 2013-07-19 NOTE — ED Notes (Signed)
[facility] presents with a 63 yo female from home with CP that radiates to back.  Pt has previous cardiac hx including 2 stints in 2013 and quadruple bypass in 1998.  Pt was awaken to CP this morning at 9:55 am and took 1 NTG, pt subsequently took 2 additional NTG.  [facility] gave 1 NTG in route to this facility.  Pt also took 325 ASA.  Pain is rated at "4" out of "10".  Borderline diabetes.

## 2013-07-19 NOTE — CV Procedure (Signed)
CARDIAC CATHETERIZATION REPORT  NAME:  Wendy [name]   MRN: [id number] DOB:  Aug 08, 1950   ADMIT DATE: 07/19/2013 Procedure Date: 07/19/2013  INTERVENTIONAL CARDIOLOGIST: Leonie Man, M.D., MS PRIMARY CARE PROVIDER: No primary provider on file. PRIMARY CARDIOLOGIST:  PATIENT:  Wendy [name] is a 63 y.o. super morbidly obese female with a history of CAD and PVD who is now followed by Dr. [name] after returning back to [place] area from [place]. She has a history of CABG in 1998 (SVG-OM, SVG-RCA, LIMA-LAD. Shortly after her CABG she had RCA infarct with the proceed fully included. She is now had stents placed in the vein graft to the OM was low at the RCA. She presented to [facility] Emergency Room morning of May 15 with complaint of chest tightness roughly 2-8/10 also noted that had increased in intensity after waiting appropriate morning. She is had similar to those in the past that were responsive to subcutaneous. She presented with symptoms began roughly at 30 the morning. She is now referred for cardiac catheterization. nitroglycerin.  PRE-OPERATIVE DIAGNOSIS:    Chest Pain concerning for Unstable Angina.  PROCEDURES PERFORMED:    Catheterization with Native and Graft Angiography.  PROCEDURE:Consent:  Risks of procedure as well as the alternatives and risks of each were explained to the (patient/caregiver).  Consent for procedure obtained. Consent for signed by MD and patient with RN witness -- placed on chart.   PROCEDURE: The patient was brought to the 2nd [place] [facility]iac Catheterization Lab in the fasting state and prepped and draped in the usual sterile fashion for Left groin or radial access. A modified Allen's test with plethysmography was performed, revealing excellent Ulnar artery collateral flow.  Sterile technique was used including antiseptics, cap, gloves, gown, hand hygiene, mask and sheet.  Skin prep: Chlorhexidine.  Time Out: Verified patient  identification, verified procedure, site/side was marked, verified correct patient position, special equipment/implants available, medications/allergies/relevent history reviewed, required imaging and test results available.  Performed  Access: LEFT Radial Artery; 6 Fr Sheath -- Seldinger technique (Angiocath Micropuncture Kit)  IA Radial Cocktail, IV Heparin 5000 Units Diagnostic Left Heart Catheterization with Coronary And Graft Angiography:  5 Fr TIG 4.0, JR 4, Angled Pigtail, and IMA catheters advanced and exchanged over long exchange safety J-wire into the ascending aorta and used for selective coronary artery engagement.  Left Coronary Artery Angiography: TIG 4.0  SVG-OM1 Angiography: TIG 4 point  Right Coronary Artery and SVG-RCA Angiography: JR 4  LV Hemodynamics (LV Gram) and Subclavian Angiogram: Angled pigtail catheter:   TR Band:  1715 Hours, 15 mL air  Hemodynamics:  Central Aortic / Mean Pressures: 112/59/90 mmHg  Left Ventricular Pressures / EDP: 112/19/22 mmHg  Central aortic versus subclavian pressure gradient was only 10 mmHg.  Left Ventriculography:  EF: 55-60 %  Wall Motion: Relatively normal  Coronary Anatomy:  Left Main: Large caliber vessel that bifurcates distally into the LAD & Circumflex. Angiographically normal. LAD: Moderate-Large caliber vessel with mild diffuse luminal irregularities, the vessel reaches down to the apex it gives off 2 diagonal branches both of which bifurcate. One is proximal, the other is in the mid LAD. There is a defined string sign him what was the LIMA graft that is likely atretic. The LAD has no real significant disease.  D1: Small-caliber vessel with a mid 60-70% stenosis. Not PCI amenable.  D2: Small to moderate caliber vessel that bifurcates. Minimal luminal irregularities.  LIMA-LAD: Not visualized using subclavian angiography.  There  is a slight string sign at what appears to be the ostium. Likely occluded.  Left  Subclavian Artery: Ostial roughly 60% stenosis Left Circumflex: Moderate caliber vessel that terminates in the AV groove. Diffuse distal disease.  SVG-OM1: Widely patent graft to a trifurcating distal circumflex/OM with 3 branches. The branches are small in diameter but without significant disease. There is a patent stent in the distal portion of the graft.    RCA: Small caliber vessel it is chronically occluded in the mid vessel. The upstream vessels very small.  SVG-distal RCA: Antegrade flow fills a moderate caliber PDA as well as Posterior Atrioventricular Groove Vessel branch RPAV). Retrograde flow perfuses up to the mid RCA.  RPDA: Moderate caliber vessel, no significant disease   RPL Sysytem:The RPAV is a moderate caliber vessel that gives off 3 small posterior lateral branches.  After reviewing the initial angiography, no obvious culprit lesion was identified.   MEDICATIONS:  Anesthesia:  Local Lidocaine 4 ml  Sedation:  3 mg IV Versed, 200 mcg IV fentanyl ;   Omnipaque Contrast: 170 ml  Anticoagulation:  IV Heparin 5000 Units Radial Cocktail: 5 mg Verapamil, 400 mcg NTG, 2 ml 2% Lidocaine in 10 ml NS; + additional 3 mg Versed.  PATIENT DISPOSITION:    The patient was transferred to the PACU holding area in a hemodynamicaly stable, chest pain free condition.  The patient tolerated the procedure well, and there were no complications.  EBL:   < 10 ml  The patient was stable before, during, and after the procedure.  POST-OPERATIVE DIAGNOSIS:    No angiographic source of chest pain identified.  Like atretic LIMA (not visualized with LSCA angioraphy, but widely patent LAD.  Widely patent SVG-OM with patent stent; minimal ISR disease in SVG-RCA  PLAN OF CARE:  Standard post radial cath care. Will stop IV heparin drip.  To discharge in the morning.  She does have moderately elevated LVEDP, would consider adding diuretic.   Leonie Man, M.D., M.S. [facility] [address] [place], [place]  [address]  [phone]  07/19/2013 5:34 PM

## 2013-07-19 NOTE — H&P (Addendum)
History and Physical   Patient ID: Wendy [name] MRN: [id number], DOB/AGE: 1950-09-07 63 y.o. Date of Encounter: 07/19/2013  Primary Physician: Lissa Hoard, PA-C at [facility] in [place] Primary Cardiologist:  Dr. [name]  Chief Complaint:  Chest pain  HPI: Wendy [name] is a 63 y.o. female with a long history of CAD. She saw Dr. [name] yesterday and was doing well, medical therapy for known CAD.  Today she got up at 8:30 am but did not feel well and went back to bed. At approximately 10:00, she was wakened by chest pain. She was also having a panic attack. She took Xanax 0.5 mg, ASA 325 mg, and SL NTG x 3. The NTG were fresh and gave her a headache but did not change the pain. EMS was called and gave her SL NTG x 1. Her pain gradually eased off during transport but increased. It has ranged from an 8 to a 2/10 but she has not been pain-free since it started this am. She was a little nauseated but denies SOB or diaphoresis, although she holds her breath during the pain.   The last episode prior to today was a few weeks ago, and resolved with SL NTG x 3. Last stress test was a few weeks ago, performed by Dr. 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    Past Medical History  Diagnosis Date  . Diabetes mellitus without complication     borderline diabetes  . Hypertension   . PAD (peripheral artery disease)   . COPD (chronic obstructive pulmonary disease)   . Peripheral neuropathy   . Chronic back pain   . Chronic leg pain   . CAD (coronary artery disease), native coronary artery     Hx CABG 1998, last PCI 2013    Surgical History:  Past Surgical History  Procedure Laterality Date  . Cardiac surgery  1998    LTA-LAD, SVG-OM, SVG-RCA  . Coronary angioplasty with stent placement  11/2011, 02/2012    DES to SVG-RCA both times, In [place], [place], Dr. Bruce Donath     I have reviewed the patient's current medications. Prior to Admission medications    Medication Sig Start Date End Date Taking? Authorizing Provider  albuterol (PROVENTIL HFA;VENTOLIN HFA) 108 (90 BASE) MCG/ACT inhaler Inhale 2 puffs into the lungs every 6 (six) hours as needed for wheezing or shortness of breath.   Yes Historical Provider, MD  ALPRAZolam Duanne Moron) 0.5 MG tablet Take 0.5 mg by mouth 3 (three) times daily as needed for anxiety.   Yes Historical Provider, MD  aspirin 81 MG tablet Take 81 mg by mouth daily.   Yes Historical Provider, MD  clopidogrel (PLAVIX) 75 MG tablet Take 75 mg by mouth daily with breakfast.   Yes Historical Provider, MD  Cyanocobalamin 1000 MCG/ML KIT Inject 1,000 mcg as directed every 30 (thirty) days.   Yes Historical Provider, MD  ezetimibe (ZETIA) 10 MG tablet Take 10 mg by mouth daily.   Yes Historical Provider, MD  Fish Oil-Cholecalciferol (FISH OIL + D3 PO) Take 1 capsule by mouth daily.   Yes Historical Provider, MD  Fluticasone-Salmeterol (ADVAIR) 250-50 MCG/DOSE AEPB Inhale 1 puff into the lungs 2 (two) times daily.   Yes Historical Provider, MD  metoprolol succinate (TOPROL-XL) 100 MG 24 hr tablet Take 100 mg by mouth 2 (two) times daily. Take with or immediately following a meal.   Yes Historical Provider, MD  naproxen (NAPROSYN) 500 MG  tablet Take 500 mg by mouth 2 (two) times daily as needed for mild pain.   Yes Historical Provider, MD  nitroGLYCERIN (NITROSTAT) 0.4 MG SL tablet Place 0.4 mg under the tongue every 5 (five) minutes as needed for chest pain.   Yes Historical Provider, MD  omeprazole (PRILOSEC) 40 MG capsule Take 40 mg by mouth 2 (two) times daily.   Yes Historical Provider, MD  simvastatin (ZOCOR) 20 MG tablet Take 20 mg by mouth daily.   Yes Historical Provider, MD  triamterene-hydrochlorothiazide (DYAZIDE) 37.5-25 MG per capsule Take 1 capsule by mouth daily.   Yes Historical Provider, MD   Scheduled Meds:  Continuous Infusions: . sodium chloride 20 mL (07/19/13 1300)  . nitroGLYCERIN 30 mcg/min (07/19/13 1424)    PRN Meds:.nitroGLYCERIN  Allergies:  Allergies  Allergen Reactions  . Benadryl [Diphenhydramine] Shortness Of Breath  . Amoxicillin Nausea And Vomiting  . Flexeril [Cyclobenzaprine] Other (See Comments)    States that it messes with her blood circulation; pt states that she has to walk around    History   Social History  . Marital Status: Legally Separated    Spouse Name: N/A    Number of Children: N/A  . Years of Education: N/A   Occupational History  . Disabled    Social History Main Topics  . Smoking status: Former Smoker    Quit date: 07/20/1998  . Smokeless tobacco: Never Used  . Alcohol Use: No  . Drug Use: No  . Sexual Activity: Not on file   Other Topics Concern  . Not on file   Social History Narrative   Lives with best friend.     History reviewed. No pertinent family history. Family Status  Relation Status Death Age  . Mother Deceased 86    Died of cancer, hx CAD  . Father Deceased 9    CVA or aneurysm    Review of Systems:   Full 14-point review of systems otherwise negative except as noted above.  Physical Exam: Blood pressure 104/53, pulse 67, temperature 97.9 F (36.6 C), temperature source Oral, resp. rate 22, SpO2 97.00%. General: Well developed, well nourished,female in no acute distress. Head: Normocephalic, atraumatic, sclera non-icteric, no xanthomas, nares are without discharge. Dentition: poor Neck: No carotid bruits. JVD not elevated. No thyromegally Lungs: Good expansion bilaterally. without wheezes or rhonchi.  Heart: Regular rate and rhythm with S1 S2.  No S3 or S4.  soft murmur, no rubs, or gallops appreciated. Abdomen: Soft, non-tender, non-distended with normoactive bowel sounds. No hepatomegaly. No rebound/guarding. No obvious abdominal masses. Msk:  Strength and tone appear normal for age. No joint deformities or effusions, no spine or costo-vertebral angle tenderness. Extremities: No clubbing or cyanosis. No edema.  Distal  pedal pulses are 1-2+ in 4 extrem.   There is an abnormality found in the area of the right groin. The patient is not aware of any issue. There is an area that may represent very late secondary healing from surgery that was done in this area. It does not appear to be infected.  Neuro: Alert and oriented X 3. Moves all extremities spontaneously. No focal deficits noted. Psych:  Responds to questions appropriately with a normal affect. Skin: No rashes or lesions noted. In area of right groin has an area, looks like it is healing slowly by secondary intention, not infected.   Labs:   Lab Results  Component Value Date   WBC 5.7 07/19/2013   HGB 12.9 07/19/2013   HCT 39.5  07/19/2013   MCV 93.6 07/19/2013   PLT 165 07/19/2013    Recent Labs  07/19/13 1145  INR 0.92     Recent Labs Lab 07/19/13 1145  NA 141  K 4.2  CL 103  CO2 26  BUN 18  CREATININE 0.92  CALCIUM 9.4  PROT 6.8  BILITOT <0.2*  ALKPHOS 62  ALT 36*  AST 34  GLUCOSE 109*    Recent Labs  07/19/13 1145  TROPONINI <0.30    Radiology/Studies: Dg Chest Portable 1 View  07/19/2013   CLINICAL DATA:  Shortness of breath and chest pain.  EXAM: PORTABLE CHEST - 1 VIEW  COMPARISON:  Thoracic spine radiographs 09/24/2005  FINDINGS: Sequelae of prior CABG are identified. Fracture of the fourth most superior sternal wire is unchanged. Cardiomediastinal silhouette is unchanged. The lungs are well inflated and clear. No pleural effusion or pneumothorax is identified. No acute osseous abnormality is identified. Multilevel osteophytosis is noted in the thoracic spine.  IMPRESSION: No active disease.   Electronically Signed   By: Logan Bores   On: 07/19/2013 12:16      ECG: SR, No acute ischemic changes.  ASSESSMENT AND PLAN:  Principal Problem:   Intermediate coronary syndrome - pt with ongoing chest pain, improved but not relieved by SL NTG or IV NTG. Spoke with Dr. [name] by phone, he knows her well and recommends cardiac  cath. Dr. [name] aware of recommendation and has evaluated patient. Will contact cath lab. Cath today. Add heparin, continue nitrates, make sure pt on statin and BB as BP/HR will allow.  Of note, pt has an appointment with Dr. Tamala Julian, requesting a second opinion, scheduled for 06/01 at 10:30am.  Skin abnormality in the right groin.    I'm not sure what this is. We will have it cleaned and bandaged and get further opinions as to whether or not anything else needs to be done to this area. I will consult the wound care team  Signed, Lonn Georgia, PA-C 07/19/2013 2:53 PM Beeper [phone] Patient seen and examined. I agree with the assessment and plan as detailed above. See also my additional thoughts below.   I saw the patient in the emergency room. I reviewed all the information with Ms. Ahmed Prima. I encouraged the phone call to Dr. [name]. He saw her her in his office recently. He is encouraged [place] to proceed with catheterization based on what he knows about her and her presentation today. Cath will be done at the appropriate time that the schedule permits considering her symptoms. The note above reflects changes that I have made also. We need to have the right groin assessed by the wound care team. Carlena Bjornstad, MD, [facility] 07/19/2013 3:13 PM

## 2013-07-20 ENCOUNTER — Encounter (HOSPITAL_COMMUNITY): Payer: Self-pay | Admitting: Physician Assistant

## 2013-07-20 DIAGNOSIS — I10 Essential (primary) hypertension: Secondary | ICD-10-CM

## 2013-07-20 DIAGNOSIS — R079 Chest pain, unspecified: Secondary | ICD-10-CM

## 2013-07-20 DIAGNOSIS — E785 Hyperlipidemia, unspecified: Secondary | ICD-10-CM

## 2013-07-20 DIAGNOSIS — E669 Obesity, unspecified: Secondary | ICD-10-CM

## 2013-07-20 DIAGNOSIS — S31109A Unspecified open wound of abdominal wall, unspecified quadrant without penetration into peritoneal cavity, initial encounter: Secondary | ICD-10-CM

## 2013-07-20 LAB — CBC
HCT: 36.6 % (ref 36.0–46.0)
Hemoglobin: 11.6 g/dL — ABNORMAL LOW (ref 12.0–15.0)
MCH: 30 pg (ref 26.0–34.0)
MCHC: 31.7 g/dL (ref 30.0–36.0)
MCV: 94.6 fL (ref 78.0–100.0)
Platelets: 156 10*3/uL (ref 150–400)
RBC: 3.87 MIL/uL (ref 3.87–5.11)
RDW: 14.6 % (ref 11.5–15.5)
WBC: 3.8 10*3/uL — ABNORMAL LOW (ref 4.0–10.5)

## 2013-07-20 LAB — GLUCOSE, CAPILLARY
Glucose-Capillary: 91 mg/dL (ref 70–99)
Glucose-Capillary: 96 mg/dL (ref 70–99)

## 2013-07-20 LAB — COMPREHENSIVE METABOLIC PANEL
ALT: 31 U/L (ref 0–35)
AST: 30 U/L (ref 0–37)
Albumin: 3 g/dL — ABNORMAL LOW (ref 3.5–5.2)
Alkaline Phosphatase: 54 U/L (ref 39–117)
BUN: 15 mg/dL (ref 6–23)
CO2: 25 mEq/L (ref 19–32)
Calcium: 8.6 mg/dL (ref 8.4–10.5)
Chloride: 106 mEq/L (ref 96–112)
Creatinine, Ser: 0.8 mg/dL (ref 0.50–1.10)
GFR calc Af Amer: >90 mL/min (ref >90)
GFR calc non Af Amer: 78 mL/min — ABNORMAL LOW (ref >90)
Glucose, Bld: 86 mg/dL (ref 70–99)
Potassium: 4.2 mEq/L (ref 3.7–5.3)
Sodium: 143 mEq/L (ref 137–147)
Total Bilirubin: 0.2 mg/dL — ABNORMAL LOW (ref 0.3–1.2)
Total Protein: 6 g/dL (ref 6.0–8.3)

## 2013-07-20 LAB — LIPID PANEL
Cholesterol: 132 mg/dL (ref 0–200)
HDL: 22 mg/dL — ABNORMAL LOW (ref >39)
LDL Cholesterol: 52 mg/dL (ref 0–99)
Total CHOL/HDL Ratio: 6 RATIO
Triglycerides: 291 mg/dL — ABNORMAL HIGH (ref <150)
VLDL: 58 mg/dL — ABNORMAL HIGH (ref 0–40)

## 2013-07-20 LAB — TROPONIN I: Troponin I: <0.3 ng/mL (ref <0.30)

## 2013-07-20 NOTE — Discharge Instructions (Signed)
Keep area in right groin clean and dry. You may apply a small amount of Neosporin twice daily. If this is not healed in the next 5 days, have your primary care doctor follow up with you.

## 2013-07-20 NOTE — Discharge Summary (Signed)
Personally seen and examined. Agree with above.

## 2013-07-20 NOTE — Progress Notes (Signed)
  Progress Note   Subjective:  Denies CP or dyspnea   Objective:  Filed Vitals:   07/19/13 2121 07/20/13 0025 07/20/13 0441 07/20/13 0631  BP: 113/81 121/61 108/42   Pulse: 62 66 67   Temp:  97.1 F (36.2 C) 97.3 F (36.3 C)   TempSrc:  Oral Oral   Resp:  17 17   Height:    5\' 2"  (1.575 m)  Weight:    209 lb 3.5 oz (94.9 kg)  SpO2: 99% 98% 97%     Intake/Output from previous day:  Intake/Output Summary (Last 24 hours) at 07/20/13 0721 Last data filed at 07/20/13 0100  Gross per 24 hour  Intake    360 ml  Output   1000 ml  Net   -640 ml    PHYSICAL EXAM: No acute distress Neck: no JVD Cardiac:  normal S1, S2; RRR; no murmur Lungs:  clear to auscultation bilaterally, no wheezing, rhonchi or rales Abd: soft, nontender, no hepatomegaly Ext: no edemaleft wrist without hematoma or mass  Skin: small skin abrasion (ulcerated) R groin - no discharge, no redness Neuro:  CNs 2-12 intact, no focal abnormalities noted   Lab Results:  Basic Metabolic Panel:  Recent Labs  07/19/13 1145 07/20/13 0436  NA 141 143  K 4.2 4.2  CL 103 106  CO2 26 25  GLUCOSE 109* 86  BUN 18 15  CREATININE 0.92 0.80  CALCIUM 9.4 8.6    CBC:  Recent Labs  07/19/13 1145 07/20/13 0436  WBC 5.7 3.8*  HGB 12.9 11.6*  HCT 39.5 36.6  MCV 93.6 94.6  PLT 165 156    Cardiac Enzymes:  Recent Labs  07/19/13 1145 07/20/13 0436  TROPONINI <0.30 <0.30     Assessment/Plan:   1. Chest Pain:  MI r/o.  Cath with stable anatomy.  Non-cardiac pain.  No further workup.  F/u with Dr. [name] in [place]. 2. Groin Wound:  This appears to be a skin abrasion.  No need to get wound care.  We discussed applying neosporin and keeping area dry.  She can have her PCP f/u with her next week. 3. HTN:  Controlled 4. CAD:  Continue ASA, Plavix, beta blocker, statin. 5. Disposition:  D/c today.  F/u with Dr. [name] in 2 weeks and PCP next week.  Richardson Dopp, PA-C   07/20/2013 7:21 AM  Pager #  [phone]   Personally seen and examined reviewed pertinent data and agree with above.  Meds reviewed labs also. Tele nl.  No CP, no SOB. Cath (left radial access) reassuring.  OK for DC.    Candee Furbish, MD

## 2013-07-20 NOTE — Discharge Summary (Signed)
Discharge Summary   Patient ID: Wendy [name], MRN: [id number], DOB/AGE: Jan 17, 1951 63 y.o.  Admit date: 07/19/2013 Discharge date: 07/20/2013   Primary Care Physician:  No primary provider on file.   Primary Cardiologist:  Dr. [name] in [place]   Reason for Admission:  Chest Pain   Primary Discharge Diagnoses:  1. Unstable angina -  Stable anatomy by cath this admission.  Med Rx continued 2. CAD (coronary artery disease) 3. Hypertension 4. HLD (hyperlipidemia) 5. Right groin wound - Basic wound care recommended with primary care followup.     Wt Readings from Last 3 Encounters:  07/20/13 209 lb 3.5 oz (94.9 kg)  07/20/13 209 lb 3.5 oz (94.9 kg)    Secondary Discharge Diagnoses:   Past Medical History  Diagnosis Date  . Glucose intolerance (impaired glucose tolerance)   . Hypertension   . PAD (peripheral artery disease)   . COPD (chronic obstructive pulmonary disease)   . Peripheral neuropathy   . Chronic back pain   . Chronic leg pain   . CAD (coronary artery disease), native coronary artery     Hx CABG 1998, last PCI 2013; LHC 07/2013:  EF 55-60%, L-LAD prob atretic, no sig disease in LAD, S-OM1 ok with patent stent, S-dRCA ok => med Rx.  Wendy [name] HLD (hyperlipidemia)       Allergies:    Allergies  Allergen Reactions  . Benadryl [Diphenhydramine] Shortness Of Breath  . Amoxicillin Nausea And Vomiting  . Flexeril [Cyclobenzaprine] Other (See Comments)    States that it messes with her blood circulation; Wendy [name] states that Wendy [name] has to walk around      Procedures Performed This Admission:    Cardiac Catheterization 07/19/13: Left Ventriculography:  EF: 55-60 %  Wall Motion: Relatively normal LVEDP 22 Coronary Anatomy:  Left Main: Large caliber vessel that bifurcates distally into the LAD & Circumflex. Angiographically normal. LAD: Moderate-Large caliber vessel with mild diffuse luminal irregularities, the vessel reaches down to the apex it gives off 2 diagonal  branches both of which bifurcate. One is proximal, the other is in the mid LAD. There is a defined string sign him what was the LIMA graft that is likely atretic. The LAD has no real significant disease.  D1: Small-caliber vessel with a mid 60-70% stenosis. Not PCI amenable.  D2: Small to moderate caliber vessel that bifurcates. Minimal luminal irregularities.  LIMA-LAD: Not visualized using subclavian angiography. There is a slight string sign at what appears to be the ostium. Likely occluded. Left Subclavian Artery: Ostial roughly 60% stenosis Left Circumflex: Moderate caliber vessel that terminates in the AV groove. Diffuse distal disease.  SVG-OM1: Widely patent graft to a trifurcating distal circumflex/OM with 3 branches. The branches are small in diameter but without significant disease. There is a patent stent in the distal portion of the graft. RCA: Small caliber vessel it is chronically occluded in the mid vessel. The upstream vessels very small.  SVG-distal RCA: Antegrade flow fills a moderate caliber PDA as well as Posterior Atrioventricular Groove Vessel branch RPAV). Retrograde flow perfuses up to the mid RCA.  RPDA: Moderate caliber vessel, no significant disease  RPL Sysytem:The RPAV is a moderate caliber vessel that gives off 3 small posterior lateral branches.     Hospital Course:  Wendy [name] is a 63 y.o. female with a hx of CAD s/p CABG 1998, HTN, HL, COPD, borderline diabetes.  Wendy [name] is followed by Dr. [name] in [place].  Wendy [name] had a recent stress test that demonstrated  inferior MI, no ischemia and EF 65%.  Wendy [name] saw Dr. [name] the day before admission and was doing well.  However, Wendy [name] was awoken by chest pain that did not improve with NTG.  Wendy [name] summoned EMS and was brought to [facility] ED.  Wendy [name] was admitted and placed on Heparin, nitrates.  Her cardiologist was contacted in [place] and the decision was made to pursue cardiac cath.  CEs remained negative.  LHC was done yesterday  and demonstrated a (likely) atretic LIMA-LAD with no significant disease in the native LAD, patent S-OM with patent stent and patent S-distRCA.  EF was preserved.  No source of chest pain found and medical Rx was recommended.  Wendy [name] was noted to have a small lesion in her R groin upon admission.  This appears to be an abrasion and the patient has been asked to apply neosporin, keep the area clean and dry and follow up with her PCP.  Wendy [name] was seen by Dr. Candee Furbish this AM.  Wendy [name] is chest pain free and felt to be stable for d/c to home.    Of note, Wendy [name] does have + nitrites and bacteria on her UA.  But the patient denies any symptoms of dysuria, urinary frequency or fever.  Wendy [name] should follow up with her PCP next week.     Discharge Vitals:   Blood pressure 125/74, pulse 68, temperature 98 F (36.7 C), temperature source Oral, resp. rate 16, height 5' 2"  (1.575 m), weight 209 lb 3.5 oz (94.9 kg), SpO2 96.00%.   Labs:   Recent Labs  07/19/13 1145 07/20/13 0436  WBC 5.7 3.8*  HGB 12.9 11.6*  HCT 39.5 36.6  MCV 93.6 94.6  PLT 165 156     Recent Labs  07/19/13 1145 07/20/13 0436  NA 141 143  K 4.2 4.2  CL 103 106  CO2 26 25  BUN 18 15  CREATININE 0.92 0.80  CALCIUM 9.4 8.6  PROT 6.8 6.0  BILITOT <0.2* 0.2*  ALKPHOS 62 54  ALT 36* 31  AST 34 30     Recent Labs  07/19/13 1145 07/20/13 0436  TROPONINI <0.30 <0.30    Lab Results  Component Value Date   CHOL 132 07/20/2013   HDL 22* 07/20/2013   LDLCALC 52 07/20/2013   TRIG 291* 07/20/2013    Recent Labs  07/19/13 1145  INR 0.92     Diagnostic Procedures and Studies:  Dg Chest Portable 1 View  07/19/2013      IMPRESSION: No active disease.   Electronically Signed   By: Logan Bores   On: 07/19/2013 12:16     Disposition:   Wendy [name] is being discharged home today in good condition.  Follow-up Plans & Appointments      Follow-up Information   Call [facility], [place]. (please call to arrange follow up in the next 2  weeks)    Specialty:  Cardiology   Contact information:   [address] [place] [address] [phone]       Discharge Medications    Medication List         albuterol 108 (90 BASE) MCG/ACT inhaler  Commonly known as:  PROVENTIL HFA;VENTOLIN HFA  Inhale 2 puffs into the lungs every 6 (six) hours as needed for wheezing or shortness of breath.     ALPRAZolam 0.5 MG tablet  Commonly known as:  XANAX  Take 0.5 mg by mouth 3 (three) times daily as needed for anxiety.     aspirin 81  MG tablet  Take 81 mg by mouth daily.     clopidogrel 75 MG tablet  Commonly known as:  PLAVIX  Take 75 mg by mouth daily with breakfast.     Cyanocobalamin 1000 MCG/ML Kit  Inject 1,000 mcg as directed every 30 (thirty) days.     ezetimibe 10 MG tablet  Commonly known as:  ZETIA  Take 10 mg by mouth daily.     FISH OIL + D3 PO  Take 1 capsule by mouth daily.     Fluticasone-Salmeterol 250-50 MCG/DOSE Aepb  Commonly known as:  ADVAIR  Inhale 1 puff into the lungs 2 (two) times daily.     metoprolol succinate 100 MG 24 hr tablet  Commonly known as:  TOPROL-XL  Take 100 mg by mouth 2 (two) times daily. Take with or immediately following a meal.     naproxen 500 MG tablet  Commonly known as:  NAPROSYN  Take 500 mg by mouth 2 (two) times daily as needed for mild pain.     nitroGLYCERIN 0.4 MG SL tablet  Commonly known as:  NITROSTAT  Place 0.4 mg under the tongue every 5 (five) minutes as needed for chest pain.     omeprazole 40 MG capsule  Commonly known as:  PRILOSEC  Take 40 mg by mouth 2 (two) times daily.     simvastatin 20 MG tablet  Commonly known as:  ZOCOR  Take 20 mg by mouth daily.     triamterene-hydrochlorothiazide 37.5-25 MG per capsule  Commonly known as:  DYAZIDE  Take 1 capsule by mouth daily.         Outstanding Labs/Studies  1. Urinalysis with PCP next week   Duration of Discharge Encounter: Greater than 30 minutes including physician and PA  time.  Signed, Richardson Dopp, PA-C   07/20/2013 8:33 AM

## 2013-08-05 ENCOUNTER — Ambulatory Visit: Payer: Self-pay | Admitting: Interventional Cardiology

## 2013-08-26 ENCOUNTER — Encounter (HOSPITAL_COMMUNITY)
Admission: RE | Admit: 2013-08-26 | Discharge: 2013-08-26 | Disposition: A | Discharge status: Home / Self Care | Payer: Medicare Other | Source: Ambulatory Visit | Attending: Orthopedic Surgery | Admitting: Orthopedic Surgery

## 2013-08-26 ENCOUNTER — Ambulatory Visit (HOSPITAL_COMMUNITY)
Admission: RE | Admit: 2013-08-26 | Discharge: 2013-08-26 | Disposition: A | Discharge status: Home / Self Care | Payer: Medicare Other | Source: Ambulatory Visit | Attending: Anesthesiology | Admitting: Anesthesiology

## 2013-08-26 ENCOUNTER — Encounter (HOSPITAL_COMMUNITY): Payer: Self-pay

## 2013-08-26 DIAGNOSIS — E119 Type 2 diabetes mellitus without complications: Secondary | ICD-10-CM | POA: Diagnosis not present

## 2013-08-26 DIAGNOSIS — I252 Old myocardial infarction: Secondary | ICD-10-CM | POA: Diagnosis not present

## 2013-08-26 DIAGNOSIS — M549 Dorsalgia, unspecified: Secondary | ICD-10-CM | POA: Diagnosis not present

## 2013-08-26 DIAGNOSIS — I2510 Atherosclerotic heart disease of native coronary artery without angina pectoris: Secondary | ICD-10-CM | POA: Diagnosis not present

## 2013-08-26 DIAGNOSIS — Z951 Presence of aortocoronary bypass graft: Secondary | ICD-10-CM | POA: Diagnosis not present

## 2013-08-26 DIAGNOSIS — Y33XXXS Other specified events, undetermined intent, sequela: Secondary | ICD-10-CM | POA: Diagnosis not present

## 2013-08-26 DIAGNOSIS — J449 Chronic obstructive pulmonary disease, unspecified: Secondary | ICD-10-CM | POA: Diagnosis not present

## 2013-08-26 DIAGNOSIS — M79609 Pain in unspecified limb: Secondary | ICD-10-CM | POA: Diagnosis not present

## 2013-08-26 DIAGNOSIS — G609 Hereditary and idiopathic neuropathy, unspecified: Secondary | ICD-10-CM | POA: Diagnosis not present

## 2013-08-26 DIAGNOSIS — Z7902 Long term (current) use of antithrombotics/antiplatelets: Secondary | ICD-10-CM | POA: Diagnosis not present

## 2013-08-26 DIAGNOSIS — G4730 Sleep apnea, unspecified: Secondary | ICD-10-CM | POA: Diagnosis not present

## 2013-08-26 DIAGNOSIS — G8929 Other chronic pain: Secondary | ICD-10-CM | POA: Diagnosis not present

## 2013-08-26 DIAGNOSIS — Z9861 Coronary angioplasty status: Secondary | ICD-10-CM | POA: Diagnosis not present

## 2013-08-26 DIAGNOSIS — E785 Hyperlipidemia, unspecified: Secondary | ICD-10-CM | POA: Diagnosis not present

## 2013-08-26 DIAGNOSIS — Z6835 Body mass index (BMI) 35.0-35.9, adult: Secondary | ICD-10-CM | POA: Diagnosis not present

## 2013-08-26 DIAGNOSIS — IMO0002 Reserved for concepts with insufficient information to code with codable children: Secondary | ICD-10-CM | POA: Diagnosis present

## 2013-08-26 DIAGNOSIS — S42309S Unspecified fracture of shaft of humerus, unspecified arm, sequela: Secondary | ICD-10-CM | POA: Diagnosis not present

## 2013-08-26 DIAGNOSIS — M948X9 Other specified disorders of cartilage, unspecified sites: Secondary | ICD-10-CM | POA: Diagnosis not present

## 2013-08-26 DIAGNOSIS — Z87891 Personal history of nicotine dependence: Secondary | ICD-10-CM | POA: Diagnosis not present

## 2013-08-26 DIAGNOSIS — I10 Essential (primary) hypertension: Secondary | ICD-10-CM | POA: Diagnosis not present

## 2013-08-26 DIAGNOSIS — F411 Generalized anxiety disorder: Secondary | ICD-10-CM | POA: Diagnosis not present

## 2013-08-26 DIAGNOSIS — K219 Gastro-esophageal reflux disease without esophagitis: Secondary | ICD-10-CM | POA: Diagnosis not present

## 2013-08-26 HISTORY — DX: Gastro-esophageal reflux disease without esophagitis: K21.9

## 2013-08-26 HISTORY — DX: Anxiety disorder, unspecified: F41.9

## 2013-08-26 HISTORY — DX: Shortness of breath: R06.02

## 2013-08-26 HISTORY — DX: Acute myocardial infarction, unspecified: I21.9

## 2013-08-26 LAB — BASIC METABOLIC PANEL
BUN: 21 mg/dL (ref 6–23)
CO2: 26 mEq/L (ref 19–32)
Calcium: 10.3 mg/dL (ref 8.4–10.5)
Chloride: 100 mEq/L (ref 96–112)
Creatinine, Ser: 0.99 mg/dL (ref 0.50–1.10)
GFR calc Af Amer: 70 mL/min — ABNORMAL LOW (ref >90)
GFR calc non Af Amer: 60 mL/min — ABNORMAL LOW (ref >90)
Glucose, Bld: 99 mg/dL (ref 70–99)
Potassium: 4.9 mEq/L (ref 3.7–5.3)
Sodium: 141 mEq/L (ref 137–147)

## 2013-08-26 LAB — CBC
HCT: 43.7 % (ref 36.0–46.0)
Hemoglobin: 14.3 g/dL (ref 12.0–15.0)
MCH: 31.1 pg (ref 26.0–34.0)
MCHC: 32.7 g/dL (ref 30.0–36.0)
MCV: 95 fL (ref 78.0–100.0)
Platelets: 253 10*3/uL (ref 150–400)
RBC: 4.6 MIL/uL (ref 3.87–5.11)
RDW: 14.4 % (ref 11.5–15.5)
WBC: 7.7 10*3/uL (ref 4.0–10.5)

## 2013-08-26 NOTE — Progress Notes (Signed)
Anesthesia Chart Review:  Patient is a 63 year old female scheduled for ORIF of left humerus tomorrow by Dr. Marcelino Scot.  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 BMI is consistent with obesity.  Her primary cardiologist is Dr. Shirlee More with [place].  Most recent records are still pending, but she was evaluated for chest pain last month at [facility] and underwent a cardiac cath (see below) with continued medical therapy recommended.  She denied any recurrent chest pain during her PAT visit.  07/19/13 Coronary Anatomy:  Left Main: Large caliber vessel that bifurcates distally into the LAD & Circumflex. Angiographically normal. LAD: Moderate-Large caliber vessel with mild diffuse luminal irregularities, the vessel reaches down to the apex it gives off 2 diagonal branches both of which bifurcate. One is proximal, the other is in the mid LAD. There is a defined string sign him what was the LIMA graft that is likely atretic. The LAD has no real significant disease.   D1: Small-caliber vessel with a mid 60-70% stenosis. Not PCI amenable.   D2: Small to moderate caliber vessel that bifurcates. Minimal luminal irregularities.   LIMA-LAD: Not visualized using subclavian angiography. There is a slight string sign at what appears to be the ostium. Likely occluded. Left Subclavian Artery: Ostial roughly 60% stenosis Left Circumflex: Moderate caliber vessel that terminates in the AV groove. Diffuse distal disease.         SVG-OM1: Widely patent graft to a trifurcating distal circumflex/OM with 3  branches. The branches are small in diameter but without significant disease. There is a patent stent in the distal portion of the graft. RCA: Small caliber vessel it is chronically occluded in the mid vessel. The upstream vessels very small.   SVG-distal RCA: Antegrade flow fills a moderate caliber PDA as well as Posterior Atrioventricular Groove Vessel branch RPAV). Retrograde flow perfuses up to the mid RCA.  RPDA: Moderate caliber vessel, no significant disease  RPL Sysytem:The RPAV is a moderate caliber vessel that gives off 3 small posterior lateral branches. POST-OPERATIVE DIAGNOSIS: No angiographic source of chest pain identified. Like atretic LIMA (not visualized with LSCA angioraphy, but widely patent LAD. Widely patent SVG-OM with patent stent; minimal ISR disease in SVG-RCA. She does have moderately elevated LVEDP, would consider adding diuretic. (Dr. Glenetta Hew)  Cardiology notes from her 07/19/2013 admission indicate that Dr. [name] was contacted prior to her undergoing a cardiac cath, and that she apparently had a recent stress test that demonstrated, "inferior MI, no ischemia and EF 65%."  EKG on 07/19/13 showed NSR, cannot rule out inferior MI (age undetermined), non-specific ST abnormality. Inferolateral ST abnormality is less prominent when compared to prior tracing on 03/06/12 (under Media tab).   CXR on 08/26/13 showed: No active cardiopulmonary disease. Evidence of prior percutaneous coronary artery stenting and multivessel CABG suggests a significant past history of coronary artery disease.  Preoperative labs noted.   Although the most recent records from Dr. [name] are still pending, she did have a cardiac cath at [facility] just last month with medical therapy recommended.  She denied further chest pain. If no acute changes then  I would anticipate that she could proceed as planned.  George Hugh [facility] Short Stay Center/Anesthesiology Phone [phone] 08/26/2013 4:30  PM

## 2013-08-26 NOTE — Progress Notes (Addendum)
Cardiologist: Dr. [name]  Request notes/stress test/ekg/echo Primary: Wendy Sabal, PA at [facility] in [place], [place] Dr. Alcide Clever: sleep study  Jacob Moores called for orders. None in epic.

## 2013-08-26 NOTE — Pre-Procedure Instructions (Signed)
Wendy [name]  08/26/2013   Your procedure is scheduled on:  Tuesday, June 23rd  Report to [facility] Admitting at 0600 AM.  Call this number if you have problems the morning of surgery: [phone]   Remember:   Do not eat food or drink liquids after midnight.   Take these medicines the morning of surgery with A SIP OF WATER: xanax, albuterol if needed, toprol, advair, prilosec   Do not wear jewelry, make-up or nail polish.  Do not wear lotions, powders, or perfumes. You may wear deodorant.  Do not shave 48 hours prior to surgery. Men may shave face and neck.  Do not bring valuables to the hospital.  [facility] is not responsible for any belongings or valuables.               Contacts, dentures or bridgework may not be worn into surgery.  Leave suitcase in the car. After surgery it may be brought to your room.  For patients admitted to the hospital, discharge time is determined by your  treatment team.               Patients discharged the day of surgery will not be allowed to drive home.  Please read over the following fact sheets that you were given: Pain Booklet, Coughing and Deep Breathing and Surgical Site Infection Prevention [place] - Preparing for Surgery  Before surgery, you can play an important role.  Because skin is not sterile, your skin needs to be as free of germs as possible.  You can reduce the number of germs on you skin by washing with CHG (chlorahexidine gluconate) soap before surgery.  CHG is an antiseptic cleaner which kills germs and bonds with the skin to continue killing germs even after washing.  Please DO NOT use if you have an allergy to CHG or antibacterial soaps.  If your skin becomes reddened/irritated stop using the CHG and inform your nurse when you arrive at Short Stay.  Do not shave (including legs and underarms) for at least 48 hours prior to the first CHG shower.  You may shave your face.  Please follow these instructions  carefully:   1.  Shower with CHG Soap the night before surgery and the morning of Surgery.  2.  If you choose to wash your hair, wash your hair first as usual with your normal shampoo.  3.  After you shampoo, rinse your hair and body thoroughly to remove the shampoo.  4.  Use CHG as you would any other liquid soap.  You can apply CHG directly to the skin and wash gently with scrungie or a clean washcloth.  5.  Apply the CHG Soap to your body ONLY FROM THE NECK DOWN.  Do not use on open wounds or open sores.  Avoid contact with your eyes, ears, mouth and genitals (private parts).  Wash genitals (private parts) with your normal soap.  6.  Wash thoroughly, paying special attention to the area where your surgery will be performed.  7.  Thoroughly rinse your body with warm water from the neck down.  8.  DO NOT shower/wash with your normal soap after using and rinsing off the CHG Soap.  9.  Pat yourself dry with a clean towel.            10.  Wear clean pajamas.            11.  Place clean sheets on your bed the  night of your first shower and do not sleep with pets.  Day of Surgery  Do not apply any lotions/deoderants the morning of surgery.  Please wear clean clothes to the hospital/surgery center.

## 2013-08-26 NOTE — H&P (Signed)
[facility] H&P  Chief Complaint:  Left humerus nonunion  HPI:   63 y/o RHD female sustained a L humerus fracture 08/2011 while living in [place], treated non-op as pt has a medical hx significant for COPD, ? DM, HTN, CAD s/p stents/CABG and on chronic plavix therapy.  Pt went on to develop a nonunion.  Subsequently she was referred to the OTS for definitive treatment.  We did a fairly extensive w/u for her nonunion which showed marginal vitamin D levels as well as an elevated CRP. Her elevated CRP could be due to indolent infection vs cardiac disease.  We did obtain cardiac clearance from Dr. [name] in [place].  However, approximately 1 month ago pt was admitted for chest pain and had cardiac cath performed at that time. Please notes pertaining to that admission for greater detail.  It was felt that her pain was not of cardiac origin.  Pt has not experienced any additional symptoms Pt now presents for repair of her nonunion as it is impairing her ADLs and is causing her pain   Past Medical History  Diagnosis Date  . Glucose intolerance (impaired glucose tolerance)   . Hypertension   . PAD (peripheral artery disease)   . COPD (chronic obstructive pulmonary disease)   . Peripheral neuropathy   . Chronic back pain   . Chronic leg pain   . CAD (coronary artery disease), native coronary artery     Hx CABG 1998, last PCI 2013; LHC 07/2013:  EF 55-60%, L-LAD prob atretic, no sig disease in LAD, S-OM1 ok with patent stent, S-dRCA ok => med Rx.  Marland Kitchen HLD (hyperlipidemia)   . Myocardial infarction   . Sleep apnea   . Shortness of breath     when walking  . Anxiety   . GERD (gastroesophageal reflux disease)     Past Surgical History  Procedure Laterality Date  . Cardiac surgery  1998    LTA-LAD, SVG-OM, SVG-RCA  . Coronary angioplasty with stent placement  11/2011, 02/2012    DES to SVG-RCA both times, In [place], [place], Dr. Bruce Donath  . Cervical spine surgery  1991 and again  several years later  . Knee arthroscopy Right 1998  . Finger surgery Right     ring finger    No family history on file. Social History:  reports that she quit smoking about 15 years ago. She has never used smokeless tobacco. She reports that she does not drink alcohol or use illicit drugs.  Allergies:  Allergies  Allergen Reactions  . Benadryl [Diphenhydramine] Shortness Of Breath  . Adhesive [Tape]     Burning of skin  . Amoxicillin Nausea And Vomiting  . Flexeril [Cyclobenzaprine] Other (See Comments)    States that it messes with her blood circulation; pt states that she has to walk around   No current facility-administered medications on file prior to encounter.   Current Outpatient Prescriptions on File Prior to Encounter  Medication Sig Dispense Refill  . albuterol (PROVENTIL HFA;VENTOLIN HFA) 108 (90 BASE) MCG/ACT inhaler Inhale 2 puffs into the lungs every 6 (six) hours as needed for wheezing or shortness of breath.      . ALPRAZolam (XANAX) 0.5 MG tablet Take 0.5 mg by mouth 3 (three) times daily as needed for anxiety.      . clopidogrel (PLAVIX) 75 MG tablet Take 75 mg by mouth daily with breakfast.      . Cyanocobalamin 1000 MCG/ML KIT Inject 1,000 mcg as directed every 30 (thirty)  days. 15th of each month      . ezetimibe (ZETIA) 10 MG tablet Take 10 mg by mouth daily.      . Fish Oil-Cholecalciferol (FISH OIL + D3 PO) Take 1 capsule by mouth daily.      . Fluticasone-Salmeterol (ADVAIR) 250-50 MCG/DOSE AEPB Inhale 1 puff into the lungs 2 (two) times daily.      . naproxen (NAPROSYN) 500 MG tablet Take 500 mg by mouth 2 (two) times daily with a meal.       . nitroGLYCERIN (NITROSTAT) 0.4 MG SL tablet Place 0.4 mg under the tongue every 5 (five) minutes as needed for chest pain.      Marland Kitchen omeprazole (PRILOSEC) 40 MG capsule Take 40 mg by mouth 2 (two) times daily.      . simvastatin (ZOCOR) 20 MG tablet Take 20 mg by mouth daily.      Marland Kitchen triamterene-hydrochlorothiazide  (DYAZIDE) 37.5-25 MG per capsule Take 1 capsule by mouth daily.         Results for orders placed during the hospital encounter of 08/26/13 (from the past 48 hour(s))  BASIC METABOLIC PANEL     Status: Abnormal   Collection Time    08/26/13 12:35 PM      Result Value Ref Range   Sodium 141  137 - 147 mEq/L   Potassium 4.9  3.7 - 5.3 mEq/L   Chloride 100  96 - 112 mEq/L   CO2 26  19 - 32 mEq/L   Glucose, Bld 99  70 - 99 mg/dL   BUN 21  6 - 23 mg/dL   Creatinine, Ser 0.99  0.50 - 1.10 mg/dL   Calcium 10.3  8.4 - 10.5 mg/dL   GFR calc non Af Amer 60 (*) >90 mL/min   GFR calc Af Amer 70 (*) >90 mL/min   Comment: (NOTE)     The eGFR has been calculated using the CKD EPI equation.     This calculation has not been validated in all clinical situations.     eGFR's persistently <90 mL/min signify possible Chronic Kidney     Disease.  CBC     Status: None   Collection Time    08/26/13 12:35 PM      Result Value Ref Range   WBC 7.7  4.0 - 10.5 K/uL   RBC 4.60  3.87 - 5.11 MIL/uL   Hemoglobin 14.3  12.0 - 15.0 g/dL   HCT 43.7  36.0 - 46.0 %   MCV 95.0  78.0 - 100.0 fL   MCH 31.1  26.0 - 34.0 pg   MCHC 32.7  30.0 - 36.0 g/dL   RDW 14.4  11.5 - 15.5 %   Platelets 253  150 - 400 K/uL   Dg Chest 2 View  08/26/2013   CLINICAL DATA:  Preoperative chest x-ray prior to left humeral repair  EXAM: CHEST  2 VIEW  COMPARISON:  Prior chest x-ray 07/19/2013  FINDINGS: Cardiac and mediastinal contours remain within normal limits. Patient is status post median sternotomy with evidence of multivessel CABG including LIMA bypass. Metallic stents project over the right and left anterior descending coronary arteries. The lungs are clear. No pleural effusion or pneumothorax. Incompletely imaged left mid humeral fracture. No acute osseous abnormality.  IMPRESSION: No active cardiopulmonary disease.  Evidence of prior percutaneous coronary artery stenting and multivessel CABG suggests a significant past history of  coronary artery disease.   Electronically Signed   By: Dellis Filbert.D.  On: 08/26/2013 14:14    Review of Systems  Constitutional: Negative for fever, chills and weight loss.  Respiratory: Negative for shortness of breath.   Cardiovascular: Negative for chest pain and palpitations.  Gastrointestinal: Negative for nausea, vomiting and abdominal pain.  Genitourinary: Negative for dysuria, urgency and frequency.  Musculoskeletal:       Left arm pain   Neurological: Negative for tingling, sensory change and headaches.    There were no vitals taken for this visit. Physical Exam  Constitutional: She is oriented to person, place, and time. Vital signs are normal. She is cooperative. No distress.  Older than stated age   Cardiovascular: Normal rate, regular rhythm, S1 normal and S2 normal.   Respiratory:  Clear breath sounds bilaterally   GI: Soft. Bowel sounds are normal. There is no tenderness.  Musculoskeletal:  Left upper extremity    + deformity L upper arm     + motion at nonunion site    nontender to elbow, forearm, wrist/hand    R/U/M motor intact    R/U/M sens intact    Ext warm     + Radial pulse    No swelling     Skin stable   Neurological: She is alert and oriented to person, place, and time.    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  We will however send cultures intra-op.  Low suspicion as this was a closed fracture In all likelihood the cause of her nonunion was biomechanical with too much motion at the fx site as well as what sounds to be questionable follow up with ortho early on.   Pt will be admitted after surgery for  observation, pain control and therapy Anesthesia record noted from earlier today about getting additional records from Cardiology   Jari Pigg, PA-C [facility] [phone] (P) 08/26/2013, 7:02 PM

## 2013-08-27 ENCOUNTER — Inpatient Hospital Stay (HOSPITAL_COMMUNITY): Payer: Medicare Other | Admitting: Anesthesiology

## 2013-08-27 ENCOUNTER — Encounter (HOSPITAL_COMMUNITY): Payer: Self-pay | Admitting: Anesthesiology

## 2013-08-27 ENCOUNTER — Encounter (HOSPITAL_COMMUNITY)
Admission: RE | Disposition: A | Discharge status: Home / Self Care | Payer: Self-pay | Source: Ambulatory Visit | Attending: Orthopedic Surgery

## 2013-08-27 ENCOUNTER — Encounter (HOSPITAL_COMMUNITY): Payer: Medicare Other | Admitting: Vascular Surgery

## 2013-08-27 ENCOUNTER — Ambulatory Visit (HOSPITAL_COMMUNITY)
Admission: RE | Admit: 2013-08-27 | Discharge: 2013-08-28 | Disposition: A | Discharge status: Home / Self Care | Payer: Medicare Other | Source: Ambulatory Visit | Attending: Orthopedic Surgery | Admitting: Orthopedic Surgery

## 2013-08-27 ENCOUNTER — Inpatient Hospital Stay (HOSPITAL_COMMUNITY): Payer: Medicare Other

## 2013-08-27 ENCOUNTER — Ambulatory Visit (HOSPITAL_COMMUNITY): Payer: Medicare Other

## 2013-08-27 DIAGNOSIS — Z7902 Long term (current) use of antithrombotics/antiplatelets: Secondary | ICD-10-CM | POA: Insufficient documentation

## 2013-08-27 DIAGNOSIS — I2510 Atherosclerotic heart disease of native coronary artery without angina pectoris: Secondary | ICD-10-CM | POA: Insufficient documentation

## 2013-08-27 DIAGNOSIS — S42309S Unspecified fracture of shaft of humerus, unspecified arm, sequela: Secondary | ICD-10-CM | POA: Insufficient documentation

## 2013-08-27 DIAGNOSIS — F411 Generalized anxiety disorder: Secondary | ICD-10-CM | POA: Insufficient documentation

## 2013-08-27 DIAGNOSIS — F419 Anxiety disorder, unspecified: Secondary | ICD-10-CM | POA: Diagnosis present

## 2013-08-27 DIAGNOSIS — G609 Hereditary and idiopathic neuropathy, unspecified: Secondary | ICD-10-CM | POA: Insufficient documentation

## 2013-08-27 DIAGNOSIS — IMO0002 Reserved for concepts with insufficient information to code with codable children: Secondary | ICD-10-CM | POA: Diagnosis not present

## 2013-08-27 DIAGNOSIS — K219 Gastro-esophageal reflux disease without esophagitis: Secondary | ICD-10-CM | POA: Insufficient documentation

## 2013-08-27 DIAGNOSIS — Z951 Presence of aortocoronary bypass graft: Secondary | ICD-10-CM | POA: Insufficient documentation

## 2013-08-27 DIAGNOSIS — G4730 Sleep apnea, unspecified: Secondary | ICD-10-CM | POA: Insufficient documentation

## 2013-08-27 DIAGNOSIS — J4489 Other specified chronic obstructive pulmonary disease: Secondary | ICD-10-CM | POA: Insufficient documentation

## 2013-08-27 DIAGNOSIS — J449 Chronic obstructive pulmonary disease, unspecified: Secondary | ICD-10-CM | POA: Diagnosis present

## 2013-08-27 DIAGNOSIS — I739 Peripheral vascular disease, unspecified: Secondary | ICD-10-CM | POA: Diagnosis present

## 2013-08-27 DIAGNOSIS — Z9861 Coronary angioplasty status: Secondary | ICD-10-CM | POA: Insufficient documentation

## 2013-08-27 DIAGNOSIS — G8929 Other chronic pain: Secondary | ICD-10-CM | POA: Insufficient documentation

## 2013-08-27 DIAGNOSIS — I10 Essential (primary) hypertension: Secondary | ICD-10-CM | POA: Diagnosis not present

## 2013-08-27 DIAGNOSIS — I252 Old myocardial infarction: Secondary | ICD-10-CM | POA: Insufficient documentation

## 2013-08-27 DIAGNOSIS — M79609 Pain in unspecified limb: Secondary | ICD-10-CM | POA: Insufficient documentation

## 2013-08-27 DIAGNOSIS — Z87891 Personal history of nicotine dependence: Secondary | ICD-10-CM | POA: Insufficient documentation

## 2013-08-27 DIAGNOSIS — Y33XXXS Other specified events, undetermined intent, sequela: Secondary | ICD-10-CM | POA: Insufficient documentation

## 2013-08-27 DIAGNOSIS — E119 Type 2 diabetes mellitus without complications: Secondary | ICD-10-CM | POA: Insufficient documentation

## 2013-08-27 DIAGNOSIS — E785 Hyperlipidemia, unspecified: Secondary | ICD-10-CM | POA: Insufficient documentation

## 2013-08-27 DIAGNOSIS — M948X9 Other specified disorders of cartilage, unspecified sites: Secondary | ICD-10-CM | POA: Insufficient documentation

## 2013-08-27 DIAGNOSIS — M549 Dorsalgia, unspecified: Secondary | ICD-10-CM | POA: Insufficient documentation

## 2013-08-27 DIAGNOSIS — G629 Polyneuropathy, unspecified: Secondary | ICD-10-CM | POA: Diagnosis present

## 2013-08-27 DIAGNOSIS — Z6835 Body mass index (BMI) 35.0-35.9, adult: Secondary | ICD-10-CM | POA: Insufficient documentation

## 2013-08-27 HISTORY — PX: ORIF HUMERUS FRACTURE: SHX2126

## 2013-08-27 LAB — GRAM STAIN

## 2013-08-27 LAB — CBC
HCT: 37.4 % (ref 36.0–46.0)
Hemoglobin: 12 g/dL (ref 12.0–15.0)
MCH: 30.5 pg (ref 26.0–34.0)
MCHC: 32.1 g/dL (ref 30.0–36.0)
MCV: 94.9 fL (ref 78.0–100.0)
Platelets: 225 10*3/uL (ref 150–400)
RBC: 3.94 MIL/uL (ref 3.87–5.11)
RDW: 14.4 % (ref 11.5–15.5)
WBC: 13.9 10*3/uL — ABNORMAL HIGH (ref 4.0–10.5)

## 2013-08-27 LAB — TYPE AND SCREEN
ABO/RH(D): A POS
Antibody Screen: NEGATIVE

## 2013-08-27 LAB — ABO/RH: ABO/RH(D): A POS

## 2013-08-27 LAB — CREATININE, SERUM
Creatinine, Ser: 1.02 mg/dL (ref 0.50–1.10)
GFR calc Af Amer: 67 mL/min — ABNORMAL LOW (ref >90)
GFR calc non Af Amer: 58 mL/min — ABNORMAL LOW (ref >90)

## 2013-08-27 SURGERY — OPEN REDUCTION INTERNAL FIXATION (ORIF) HUMERAL SHAFT FRACTURE
Anesthesia: General | Site: Arm Upper | Laterality: Left

## 2013-08-27 MED ORDER — GLYCOPYRROLATE 0.2 MG/ML IJ SOLN
INTRAMUSCULAR | Status: AC
  Filled 2013-08-27: qty 1

## 2013-08-27 MED ORDER — LIDOCAINE HCL (CARDIAC) 20 MG/ML IV SOLN
INTRAVENOUS | Status: AC
  Filled 2013-08-27: qty 5

## 2013-08-27 MED ORDER — ONDANSETRON HCL 4 MG/2ML IJ SOLN: 4.0000 mg | Freq: Four times a day (QID) | INTRAMUSCULAR | Status: DC | PRN

## 2013-08-27 MED ORDER — ACETAMINOPHEN 500 MG PO TABS
1000.0000 mg | ORAL_TABLET | Freq: Once | ORAL | Status: AC
  Administered 2013-08-27: 1000 mg via ORAL

## 2013-08-27 MED ORDER — OXYCODONE HCL 5 MG PO TABS: 5.0000 mg | ORAL_TABLET | Freq: Once | ORAL | Status: DC | PRN

## 2013-08-27 MED ORDER — EZETIMIBE 10 MG PO TABS
10.0000 mg | ORAL_TABLET | Freq: Every day | ORAL | Status: DC
  Administered 2013-08-27 – 2013-08-28 (×2): 10 mg via ORAL
  Filled 2013-08-27 (×2): qty 1

## 2013-08-27 MED ORDER — FENTANYL CITRATE 0.05 MG/ML IJ SOLN
INTRAMUSCULAR | Status: DC | PRN
  Administered 2013-08-27: 50 ug via INTRAVENOUS
  Administered 2013-08-27: 200 ug via INTRAVENOUS
  Administered 2013-08-27 (×4): 50 ug via INTRAVENOUS

## 2013-08-27 MED ORDER — MIDAZOLAM HCL 2 MG/2ML IJ SOLN: 0.5000 mg | Freq: Once | INTRAMUSCULAR | Status: DC | PRN

## 2013-08-27 MED ORDER — HYDROCODONE-ACETAMINOPHEN 7.5-325 MG PO TABS: 1.0000 | ORAL_TABLET | Freq: Four times a day (QID) | ORAL | Status: DC | PRN

## 2013-08-27 MED ORDER — HYDROMORPHONE HCL PF 1 MG/ML IJ SOLN
0.2500 mg | INTRAMUSCULAR | Status: DC | PRN
  Administered 2013-08-27 (×3): 0.5 mg via INTRAVENOUS

## 2013-08-27 MED ORDER — LIDOCAINE HCL (CARDIAC) 20 MG/ML IV SOLN
INTRAVENOUS | Status: DC | PRN
  Administered 2013-08-27: 40 mg via INTRAVENOUS

## 2013-08-27 MED ORDER — MEPERIDINE HCL 25 MG/ML IJ SOLN: 6.2500 mg | INTRAMUSCULAR | Status: DC | PRN

## 2013-08-27 MED ORDER — MORPHINE SULFATE 2 MG/ML IJ SOLN
1.0000 mg | INTRAMUSCULAR | Status: DC | PRN
  Administered 2013-08-27 – 2013-08-28 (×3): 1 mg via INTRAVENOUS
  Filled 2013-08-27 (×3): qty 1

## 2013-08-27 MED ORDER — MIDAZOLAM HCL 5 MG/5ML IJ SOLN
INTRAMUSCULAR | Status: DC | PRN
  Administered 2013-08-27: 2 mg via INTRAVENOUS

## 2013-08-27 MED ORDER — PHENYLEPHRINE HCL 10 MG/ML IJ SOLN
10.0000 mg | INTRAVENOUS | Status: DC | PRN
  Administered 2013-08-27 (×2): 40 ug/min via INTRAVENOUS

## 2013-08-27 MED ORDER — ACETAMINOPHEN 10 MG/ML IV SOLN
1000.0000 mg | Freq: Four times a day (QID) | INTRAVENOUS | Status: DC
  Administered 2013-08-27 – 2013-08-28 (×2): 1000 mg via INTRAVENOUS
  Filled 2013-08-27 (×3): qty 100

## 2013-08-27 MED ORDER — METHOCARBAMOL 500 MG PO TABS
500.0000 mg | ORAL_TABLET | Freq: Four times a day (QID) | ORAL | Status: DC | PRN
  Administered 2013-08-27 – 2013-08-28 (×2): 500 mg via ORAL
  Filled 2013-08-27 (×2): qty 1

## 2013-08-27 MED ORDER — FENTANYL CITRATE 0.05 MG/ML IJ SOLN
INTRAMUSCULAR | Status: AC
  Filled 2013-08-27: qty 5

## 2013-08-27 MED ORDER — OXYCODONE HCL 5 MG/5ML PO SOLN: 5.0000 mg | Freq: Once | ORAL | Status: DC | PRN

## 2013-08-27 MED ORDER — METOPROLOL TARTRATE 100 MG PO TABS
100.0000 mg | ORAL_TABLET | Freq: Two times a day (BID) | ORAL | Status: DC
  Administered 2013-08-28: 100 mg via ORAL
  Filled 2013-08-27 (×6): qty 1

## 2013-08-27 MED ORDER — BISACODYL 10 MG RE SUPP: 10.0000 mg | Freq: Every day | RECTAL | Status: DC | PRN

## 2013-08-27 MED ORDER — OXYCODONE HCL 5 MG PO TABS
5.0000 mg | ORAL_TABLET | ORAL | Status: DC | PRN
  Administered 2013-08-27 – 2013-08-28 (×6): 10 mg via ORAL
  Filled 2013-08-27 (×6): qty 2

## 2013-08-27 MED ORDER — EPHEDRINE SULFATE 50 MG/ML IJ SOLN
INTRAMUSCULAR | Status: AC
  Filled 2013-08-27: qty 1

## 2013-08-27 MED ORDER — HYDROMORPHONE HCL PF 1 MG/ML IJ SOLN
INTRAMUSCULAR | Status: AC
  Filled 2013-08-27: qty 1

## 2013-08-27 MED ORDER — MOMETASONE FURO-FORMOTEROL FUM 100-5 MCG/ACT IN AERO
2.0000 | INHALATION_SPRAY | Freq: Two times a day (BID) | RESPIRATORY_TRACT | Status: DC | PRN
  Filled 2013-08-27: qty 8.8

## 2013-08-27 MED ORDER — METOPROLOL TARTRATE 100 MG PO TABS
100.0000 mg | ORAL_TABLET | Freq: Once | ORAL | Status: AC
  Administered 2013-08-27: 100 mg via ORAL
  Filled 2013-08-27: qty 1
  Filled 2013-08-27: qty 2

## 2013-08-27 MED ORDER — METHOCARBAMOL 1000 MG/10ML IJ SOLN
500.0000 mg | Freq: Four times a day (QID) | INTRAVENOUS | Status: DC | PRN
  Filled 2013-08-27: qty 5

## 2013-08-27 MED ORDER — METOCLOPRAMIDE HCL 5 MG/ML IJ SOLN
INTRAMUSCULAR | Status: DC | PRN
  Administered 2013-08-27: 10 mg via INTRAVENOUS

## 2013-08-27 MED ORDER — PROPOFOL 10 MG/ML IV BOLUS
INTRAVENOUS | Status: AC
  Filled 2013-08-27: qty 20

## 2013-08-27 MED ORDER — ENOXAPARIN SODIUM 30 MG/0.3ML ~~LOC~~ SOLN
30.0000 mg | SUBCUTANEOUS | Status: DC
  Administered 2013-08-28: 30 mg via SUBCUTANEOUS
  Filled 2013-08-27 (×2): qty 0.3

## 2013-08-27 MED ORDER — HYDROXYZINE HCL 25 MG PO TABS
25.0000 mg | ORAL_TABLET | Freq: Three times a day (TID) | ORAL | Status: DC | PRN
  Administered 2013-08-27: 25 mg via ORAL
  Filled 2013-08-27: qty 1

## 2013-08-27 MED ORDER — FENTANYL CITRATE 0.05 MG/ML IJ SOLN
INTRAMUSCULAR | Status: DC
Start: 2013-08-27 — End: 2013-08-27
  Filled 2013-08-27: qty 2

## 2013-08-27 MED ORDER — LACTATED RINGERS IV SOLN
INTRAVENOUS | Status: DC
  Administered 2013-08-27: 07:00:00 via INTRAVENOUS

## 2013-08-27 MED ORDER — ONDANSETRON HCL 4 MG PO TABS: 4.0000 mg | ORAL_TABLET | Freq: Four times a day (QID) | ORAL | Status: DC | PRN

## 2013-08-27 MED ORDER — ALBUTEROL SULFATE HFA 108 (90 BASE) MCG/ACT IN AERS: 2.0000 | INHALATION_SPRAY | Freq: Four times a day (QID) | RESPIRATORY_TRACT | Status: DC | PRN

## 2013-08-27 MED ORDER — VANCOMYCIN HCL IN DEXTROSE 1-5 GM/200ML-% IV SOLN
1000.0000 mg | INTRAVENOUS | Status: AC
  Administered 2013-08-27: 1000 mg via INTRAVENOUS

## 2013-08-27 MED ORDER — MOMETASONE FURO-FORMOTEROL FUM 100-5 MCG/ACT IN AERO
2.0000 | INHALATION_SPRAY | Freq: Two times a day (BID) | RESPIRATORY_TRACT | Status: DC
  Administered 2013-08-27 – 2013-08-28 (×2): 2 via RESPIRATORY_TRACT
  Filled 2013-08-27: qty 8.8

## 2013-08-27 MED ORDER — VANCOMYCIN HCL IN DEXTROSE 1-5 GM/200ML-% IV SOLN
1000.0000 mg | Freq: Two times a day (BID) | INTRAVENOUS | Status: AC
  Administered 2013-08-27: 1000 mg via INTRAVENOUS
  Filled 2013-08-27 (×2): qty 200

## 2013-08-27 MED ORDER — MIDAZOLAM HCL 2 MG/2ML IJ SOLN: 1.0000 mg | INTRAMUSCULAR | Status: DC | PRN

## 2013-08-27 MED ORDER — NITROGLYCERIN 0.4 MG SL SUBL: 0.4000 mg | SUBLINGUAL_TABLET | SUBLINGUAL | Status: DC | PRN

## 2013-08-27 MED ORDER — POLYETHYLENE GLYCOL 3350 17 G PO PACK
17.0000 g | PACK | Freq: Every day | ORAL | Status: DC
  Administered 2013-08-27 – 2013-08-28 (×2): 17 g via ORAL
  Filled 2013-08-27 (×2): qty 1

## 2013-08-27 MED ORDER — PHENYLEPHRINE HCL 10 MG/ML IJ SOLN
INTRAMUSCULAR | Status: DC | PRN
  Administered 2013-08-27: 80 ug via INTRAVENOUS
  Administered 2013-08-27: 40 ug via INTRAVENOUS
  Administered 2013-08-27: 80 ug via INTRAVENOUS
  Administered 2013-08-27: 120 ug via INTRAVENOUS
  Administered 2013-08-27: 40 ug via INTRAVENOUS

## 2013-08-27 MED ORDER — ACETAMINOPHEN 10 MG/ML IV SOLN: 1000.0000 mg | Freq: Four times a day (QID) | INTRAVENOUS | Status: DC

## 2013-08-27 MED ORDER — FLUTICASONE PROPIONATE HFA 44 MCG/ACT IN AERO
2.0000 | INHALATION_SPRAY | Freq: Two times a day (BID) | RESPIRATORY_TRACT | Status: DC
  Administered 2013-08-27 – 2013-08-28 (×2): 2 via RESPIRATORY_TRACT
  Filled 2013-08-27 (×2): qty 10.6

## 2013-08-27 MED ORDER — PHENYLEPHRINE 40 MCG/ML (10ML) SYRINGE FOR IV PUSH (FOR BLOOD PRESSURE SUPPORT)
PREFILLED_SYRINGE | INTRAVENOUS | Status: AC
  Filled 2013-08-27: qty 10

## 2013-08-27 MED ORDER — TIOTROPIUM BROMIDE MONOHYDRATE 18 MCG IN CAPS
18.0000 ug | ORAL_CAPSULE | Freq: Every day | RESPIRATORY_TRACT | Status: DC
  Administered 2013-08-28: 18 ug via RESPIRATORY_TRACT
  Filled 2013-08-27: qty 5

## 2013-08-27 MED ORDER — ONDANSETRON HCL 4 MG/2ML IJ SOLN
INTRAMUSCULAR | Status: DC | PRN
  Administered 2013-08-27: 4 mg via INTRAVENOUS

## 2013-08-27 MED ORDER — NEOSTIGMINE METHYLSULFATE 10 MG/10ML IV SOLN
INTRAVENOUS | Status: DC | PRN
  Administered 2013-08-27 (×2): 2 mg via INTRAVENOUS

## 2013-08-27 MED ORDER — SODIUM CHLORIDE 0.9 % IJ SOLN
INTRAMUSCULAR | Status: AC
  Filled 2013-08-27: qty 10

## 2013-08-27 MED ORDER — PROPOFOL 10 MG/ML IV BOLUS
INTRAVENOUS | Status: DC | PRN
  Administered 2013-08-27: 130 mg via INTRAVENOUS

## 2013-08-27 MED ORDER — ONDANSETRON HCL 4 MG/2ML IJ SOLN
INTRAMUSCULAR | Status: AC
  Filled 2013-08-27: qty 2

## 2013-08-27 MED ORDER — POTASSIUM CHLORIDE IN NACL 20-0.9 MEQ/L-% IV SOLN
INTRAVENOUS | Status: DC
  Administered 2013-08-27: 22:00:00 via INTRAVENOUS
  Filled 2013-08-27 (×3): qty 1000

## 2013-08-27 MED ORDER — FENTANYL CITRATE 0.05 MG/ML IJ SOLN
INTRAMUSCULAR | Status: AC
Start: 2013-08-27 — End: 2013-08-27
  Filled 2013-08-27: qty 5

## 2013-08-27 MED ORDER — PANTOPRAZOLE SODIUM 40 MG PO TBEC
80.0000 mg | DELAYED_RELEASE_TABLET | Freq: Every day | ORAL | Status: DC
  Administered 2013-08-27 – 2013-08-28 (×2): 80 mg via ORAL
  Filled 2013-08-27 (×2): qty 2

## 2013-08-27 MED ORDER — TRIAMTERENE-HCTZ 37.5-25 MG PO CAPS
1.0000 | ORAL_CAPSULE | Freq: Every day | ORAL | Status: DC
  Administered 2013-08-28: 1 via ORAL
  Filled 2013-08-27: qty 1

## 2013-08-27 MED ORDER — MIDAZOLAM HCL 2 MG/2ML IJ SOLN
INTRAMUSCULAR | Status: AC
  Filled 2013-08-27: qty 2

## 2013-08-27 MED ORDER — BUPIVACAINE HCL (PF) 0.25 % IJ SOLN
INTRAMUSCULAR | Status: AC
  Filled 2013-08-27: qty 30

## 2013-08-27 MED ORDER — GLYCOPYRROLATE 0.2 MG/ML IJ SOLN
INTRAMUSCULAR | Status: DC | PRN
  Administered 2013-08-27: 0.4 mg via INTRAVENOUS
  Administered 2013-08-27: 0.2 mg via INTRAVENOUS

## 2013-08-27 MED ORDER — NEOSTIGMINE METHYLSULFATE 10 MG/10ML IV SOLN
INTRAVENOUS | Status: AC
  Filled 2013-08-27: qty 3

## 2013-08-27 MED ORDER — ACETAMINOPHEN 500 MG PO TABS
ORAL_TABLET | ORAL | Status: AC
  Filled 2013-08-27: qty 2

## 2013-08-27 MED ORDER — LACTATED RINGERS IV SOLN
INTRAVENOUS | Status: DC | PRN
  Administered 2013-08-27 (×2): via INTRAVENOUS

## 2013-08-27 MED ORDER — METOCLOPRAMIDE HCL 5 MG PO TABS
5.0000 mg | ORAL_TABLET | Freq: Three times a day (TID) | ORAL | Status: DC | PRN
  Filled 2013-08-27 (×2): qty 2

## 2013-08-27 MED ORDER — FENTANYL CITRATE 0.05 MG/ML IJ SOLN: 50.0000 ug | INTRAMUSCULAR | Status: DC | PRN

## 2013-08-27 MED ORDER — SIMVASTATIN 20 MG PO TABS
20.0000 mg | ORAL_TABLET | Freq: Every day | ORAL | Status: DC
  Administered 2013-08-27: 20 mg via ORAL
  Filled 2013-08-27 (×2): qty 1

## 2013-08-27 MED ORDER — DOCUSATE SODIUM 100 MG PO CAPS
100.0000 mg | ORAL_CAPSULE | Freq: Two times a day (BID) | ORAL | Status: DC
Start: 2013-08-27 — End: 2013-08-28
  Administered 2013-08-27 – 2013-08-28 (×2): 100 mg via ORAL
  Filled 2013-08-27 (×3): qty 1

## 2013-08-27 MED ORDER — CHLORHEXIDINE GLUCONATE 4 % EX LIQD: 60.0000 mL | Freq: Once | CUTANEOUS | Status: DC

## 2013-08-27 MED ORDER — ROCURONIUM BROMIDE 50 MG/5ML IV SOLN
INTRAVENOUS | Status: AC
  Filled 2013-08-27: qty 1

## 2013-08-27 MED ORDER — ASPIRIN EC 81 MG PO TBEC
81.0000 mg | DELAYED_RELEASE_TABLET | Freq: Every day | ORAL | Status: DC
  Administered 2013-08-28: 81 mg via ORAL
  Filled 2013-08-27: qty 1

## 2013-08-27 MED ORDER — ROCURONIUM BROMIDE 100 MG/10ML IV SOLN
INTRAVENOUS | Status: DC | PRN
  Administered 2013-08-27: 10 mg via INTRAVENOUS
  Administered 2013-08-27: 40 mg via INTRAVENOUS

## 2013-08-27 MED ORDER — DEXAMETHASONE SODIUM PHOSPHATE 4 MG/ML IJ SOLN
INTRAMUSCULAR | Status: DC | PRN
  Administered 2013-08-27: 4 mg via INTRAVENOUS

## 2013-08-27 MED ORDER — METOCLOPRAMIDE HCL 5 MG/ML IJ SOLN: 5.0000 mg | Freq: Three times a day (TID) | INTRAMUSCULAR | Status: DC | PRN

## 2013-08-27 MED ORDER — 0.9 % SODIUM CHLORIDE (POUR BTL) OPTIME
TOPICAL | Status: DC | PRN
  Administered 2013-08-27: 1000 mL

## 2013-08-27 MED ORDER — PROMETHAZINE HCL 25 MG/ML IJ SOLN: 6.2500 mg | INTRAMUSCULAR | Status: DC | PRN

## 2013-08-27 MED ORDER — VANCOMYCIN HCL IN DEXTROSE 1-5 GM/200ML-% IV SOLN
INTRAVENOUS | Status: AC
  Filled 2013-08-27: qty 200

## 2013-08-27 MED ORDER — BUPIVACAINE HCL (PF) 0.25 % IJ SOLN: INTRAMUSCULAR | Status: DC | PRN

## 2013-08-27 MED ORDER — EPHEDRINE SULFATE 50 MG/ML IJ SOLN
INTRAMUSCULAR | Status: DC | PRN
  Administered 2013-08-27: 10 mg via INTRAVENOUS
  Administered 2013-08-27: 5 mg via INTRAVENOUS

## 2013-08-27 SURGICAL SUPPLY — 94 items
APL SKNCLS STERI-STRIP NONHPOA (GAUZE/BANDAGES/DRESSINGS) ×2
BANDAGE GAUZE ELAST BULKY 4 IN (GAUZE/BANDAGES/DRESSINGS) ×6 IMPLANT
BENZOIN TINCTURE PRP APPL 2/3 (GAUZE/BANDAGES/DRESSINGS) ×6 IMPLANT
BIT DRILL 2.5 X LONG (BIT) ×1
BIT DRILL 2.5X110 QC LCP DISP (BIT) ×2 IMPLANT
BIT DRILL PERC QC 2.8X200 100 (BIT) IMPLANT
BIT DRILL QC 3.5X110 (BIT) ×2 IMPLANT
BIT DRILL X LONG 2.5 (BIT) IMPLANT
BNDG CMPR 9X4 STRL LF SNTH (GAUZE/BANDAGES/DRESSINGS) ×1
BNDG ESMARK 4X9 LF (GAUZE/BANDAGES/DRESSINGS) ×3 IMPLANT
BONE CANC CHIPS 40CC CAN1/2 (Bone Implant) ×3 IMPLANT
BRUSH SCRUB DISP (MISCELLANEOUS) ×6 IMPLANT
BUR EGG ELITE 4.0 (BURR) ×1 IMPLANT
BUR EGG ELITE 4.0MM (BURR) ×1
CHIPS CANC BONE 40CC CAN1/2 (Bone Implant) ×1 IMPLANT
CONT SPEC 4OZ CLIKSEAL STRL BL (MISCELLANEOUS) ×2 IMPLANT
CORDS BIPOLAR (ELECTRODE) ×3 IMPLANT
COVER SURGICAL LIGHT HANDLE (MISCELLANEOUS) ×6 IMPLANT
DRAIN PENROSE 1/4X12 LTX STRL (WOUND CARE) ×2 IMPLANT
DRAPE C-ARM 42X72 X-RAY (DRAPES) ×3 IMPLANT
DRAPE C-ARMOR (DRAPES) ×1 IMPLANT
DRAPE INCISE IOBAN 66X45 STRL (DRAPES) IMPLANT
DRAPE ORTHO SPLIT 77X108 STRL (DRAPES) ×6
DRAPE SURG 17X11 SM STRL (DRAPES) ×6 IMPLANT
DRAPE SURG ORHT 6 SPLT 77X108 (DRAPES) IMPLANT
DRAPE U-SHAPE 47X51 STRL (DRAPES) ×6 IMPLANT
DRILL BIT QUICK COUP 2.8MM 100 (BIT) ×2
DRILL BIT X LONG 2.5 (BIT) ×3
DRSG ADAPTIC 3X8 NADH LF (GAUZE/BANDAGES/DRESSINGS) ×3 IMPLANT
DRSG MEPILEX BORDER 4X12 (GAUZE/BANDAGES/DRESSINGS) ×2 IMPLANT
DRSG PAD ABDOMINAL 8X10 ST (GAUZE/BANDAGES/DRESSINGS) ×3 IMPLANT
ELECT REM PT RETURN 9FT ADLT (ELECTROSURGICAL) ×3
ELECTRODE REM PT RTRN 9FT ADLT (ELECTROSURGICAL) ×1 IMPLANT
EVACUATOR 1/8 PVC DRAIN (DRAIN) IMPLANT
GLOVE BIO SURGEON STRL SZ7.5 (GLOVE) ×3 IMPLANT
GLOVE BIOGEL PI IND STRL 7.0 (GLOVE) IMPLANT
GLOVE BIOGEL PI IND STRL 7.5 (GLOVE) ×1 IMPLANT
GLOVE BIOGEL PI IND STRL 8 (GLOVE) ×1 IMPLANT
GLOVE BIOGEL PI INDICATOR 7.0 (GLOVE) ×2
GLOVE BIOGEL PI INDICATOR 7.5 (GLOVE) ×2
GLOVE BIOGEL PI INDICATOR 8 (GLOVE) ×2
GOWN STRL REUS W/ TWL LRG LVL3 (GOWN DISPOSABLE) ×2 IMPLANT
GOWN STRL REUS W/ TWL XL LVL3 (GOWN DISPOSABLE) ×1 IMPLANT
GOWN STRL REUS W/TWL LRG LVL3 (GOWN DISPOSABLE) ×6
GOWN STRL REUS W/TWL XL LVL3 (GOWN DISPOSABLE) ×3
GRAFT BNE CHIP CANC 1-8 40 (Bone Implant) IMPLANT
KIT BASIN OR (CUSTOM PROCEDURE TRAY) ×3 IMPLANT
KIT INFUSE LRG II (Orthopedic Implant) ×2 IMPLANT
KIT ROOM TURNOVER OR (KITS) ×3 IMPLANT
MANIFOLD NEPTUNE II (INSTRUMENTS) ×1 IMPLANT
NDL HYPO 25X1 1.5 SAFETY (NEEDLE) ×1 IMPLANT
NEEDLE HYPO 25X1 1.5 SAFETY (NEEDLE) IMPLANT
NS IRRIG 1000ML POUR BTL (IV SOLUTION) ×3 IMPLANT
PACK TOTAL JOINT (CUSTOM PROCEDURE TRAY) ×3 IMPLANT
PAD ARMBOARD 7.5X6 YLW CONV (MISCELLANEOUS) ×6 IMPLANT
PROS LCP PLATE 14 189M (Plate) ×3 IMPLANT
PROSTHESIS LCP PLATE 14 189M (Plate) IMPLANT
SCREW CORTEX 3.5 22MM (Screw) ×2 IMPLANT
SCREW CORTEX 3.5 24MM (Screw) ×2 IMPLANT
SCREW CORTEX 3.5 26MM (Screw) ×4 IMPLANT
SCREW CORTEX 3.5 32MM (Screw) ×2 IMPLANT
SCREW LOCK CORT ST 3.5X22 (Screw) IMPLANT
SCREW LOCK CORT ST 3.5X24 (Screw) IMPLANT
SCREW LOCK CORT ST 3.5X26 (Screw) IMPLANT
SCREW LOCK CORT ST 3.5X32 (Screw) IMPLANT
SCREW LOCK T15 FT 10X3.5X2.9X (Screw) IMPLANT
SCREW LOCK T15 FT 14X3.5X2.9X (Screw) IMPLANT
SCREW LOCK T15 FT 22X3.5XST (Screw) IMPLANT
SCREW LOCK T15 FT 30X3.5X2.9X (Screw) IMPLANT
SCREW LOCK T15 FT 32X3.5X2.9X (Screw) IMPLANT
SCREW LOCKING 3.5X10 (Screw) ×3 IMPLANT
SCREW LOCKING 3.5X14 (Screw) ×3 IMPLANT
SCREW LOCKING 3.5X22 (Screw) ×3 IMPLANT
SCREW LOCKING 3.5X26 (Screw) ×2 IMPLANT
SCREW LOCKING 3.5X30 (Screw) ×3 IMPLANT
SCREW LOCKING 3.5X32 (Screw) ×6 IMPLANT
SLING ARM FOAM STRAP XLG (SOFTGOODS) ×2 IMPLANT
SPONGE GAUZE 4X4 12PLY (GAUZE/BANDAGES/DRESSINGS) ×6 IMPLANT
SPONGE LAP 18X18 X RAY DECT (DISPOSABLE) IMPLANT
STAPLER VISISTAT 35W (STAPLE) ×5 IMPLANT
STOCKINETTE IMPERVIOUS 9X36 MD (GAUZE/BANDAGES/DRESSINGS) IMPLANT
SUCTION FRAZIER TIP 10 FR DISP (SUCTIONS) ×3 IMPLANT
SUT PDS AB 2-0 CT1 27 (SUTURE) IMPLANT
SUT VIC AB 0 CT1 27 (SUTURE) ×6
SUT VIC AB 0 CT1 27XBRD ANBCTR (SUTURE) ×2 IMPLANT
SUT VIC AB 2-0 CT1 27 (SUTURE) ×6
SUT VIC AB 2-0 CT1 TAPERPNT 27 (SUTURE) ×2 IMPLANT
SYR 5ML LL (SYRINGE) IMPLANT
SYR CONTROL 10ML LL (SYRINGE) ×1 IMPLANT
TOWEL OR 17X24 6PK STRL BLUE (TOWEL DISPOSABLE) ×1 IMPLANT
TOWEL OR 17X26 10 PK STRL BLUE (TOWEL DISPOSABLE) ×7 IMPLANT
TRAY FOLEY CATH 16FRSI W/METER (SET/KITS/TRAYS/PACK) IMPLANT
WATER STERILE IRR 1000ML POUR (IV SOLUTION) ×3 IMPLANT
YANKAUER SUCT BULB TIP NO VENT (SUCTIONS) IMPLANT

## 2013-08-27 NOTE — Brief Op Note (Signed)
08/27/2013  12:57 PM  PATIENT:  Wendy [name]  64 y.o. female  PRE-OPERATIVE DIAGNOSIS:  LEFT HUMERUS NONUNION  POST-OPERATIVE DIAGNOSIS:  LEFT HUMERUS NONUNION  PROCEDURE:  Procedure(s): OPEN REDUCTION INTERNAL FIXATION (ORIF) LEFT HUMERUS  (Left) with repair of nonunion  SURGEON:  Surgeon(s) and Role:    * Rozanna Box, MD - Primary  PHYSICIAN ASSISTANT: None  ANESTHESIA:   general  I/O:  Total I/O In: 1000 [I.V.:1000] Out: 1075 [Other:800; Blood:275]  SPECIMEN:  Source of Specimen:  pseudocapsule nonunion site  DISPOSITION OF SPECIMEN:  To micro  TOURNIQUET:  * No tourniquets in log *  DICTATION: .Other Dictation: Dictation Number [id number]

## 2013-08-27 NOTE — Progress Notes (Signed)
unabl[facility] Tech Progress Note Patient Details:  Wendy [name] 1950/07/14 [id number] Unable to use shoulder surgery Patient ID: Wendy [name], female   DOB: 02-08-1951, 63 y.o.   MRN: [id number]   Braulio Bosch 08/27/2013, 8:07 PM

## 2013-08-27 NOTE — H&P (Signed)
I have seen and examined the patient. I agree with the findings above.  I discussed with the patient the risks and benefits of surgery for repair of left humerus nonunion, including the possibility of infection, nerve injury, vessel injury, wound breakdown, arthritis, symptomatic hardware, DVT/ PE, loss of motion, and need for further surgery among others.  She understood these risks and wished to proceed.   Rozanna Box, MD 08/27/2013 7:56 AM

## 2013-08-27 NOTE — Progress Notes (Signed)
Pt. Was placed on CPAP auto titrate (min: 5, max: 15) via nasal mask with 3L O2 bled in. Pt. Is tolerating CPAP well at this time without any complications.  

## 2013-08-27 NOTE — Anesthesia Preprocedure Evaluation (Addendum)
Anesthesia Evaluation  Patient identified by MRN, date of birth, ID band Patient awake    Reviewed: Allergy & Precautions, H&P , NPO status , Patient's Chart, lab work & pertinent test results, reviewed documented beta blocker date and time   Airway Mallampati: III TM Distance: >3 FB Neck ROM: Full  Mouth opening: Limited Mouth Opening  Dental  (+) Teeth Intact, Dental Advisory Given   Pulmonary shortness of breath, sleep apnea and Continuous Positive Airway Pressure Ventilation , COPD (daily inhaler) COPD inhaler, former smoker (quit 2000),  breath sounds clear to auscultation  Pulmonary exam normal       Cardiovascular hypertension, Pt. on medications and Pt. on home beta blockers + CAD, + Past MI, + Cardiac Stents ('13), + CABG (1998) and + Peripheral Vascular Disease (L subclavian stenosis) Rhythm:Regular Rate:Normal  5/15 cath   Neuro/Psych negative neurological ROS     GI/Hepatic Neg liver ROS, GERD-  Medicated and Controlled,  Endo/Other  Morbid obesity  Renal/GU negative Renal ROS     Musculoskeletal  (+) Arthritis -,   Abdominal (+) + obese,   Peds  Hematology negative hematology ROS (+)   Anesthesia Other Findings   Reproductive/Obstetrics                         Anesthesia Physical Anesthesia Plan  ASA: III  Anesthesia Plan: General   Post-op Pain Management:    Induction: Intravenous  Airway Management Planned: Oral ETT  Additional Equipment:   Intra-op Plan:   Post-operative Plan: Extubation in OR  Informed Consent: I have reviewed the patients History and Physical, chart, labs and discussed the procedure including the risks, benefits and alternatives for the proposed anesthesia with the patient or authorized representative who has indicated his/her understanding and acceptance.   Dental advisory given  Plan Discussed with: CRNA and Surgeon  Anesthesia Plan Comments:  (Plan routine monitors, GETA Dr. Marcelino Scot will assess nerve post op, prefers no regional analgesia/nerve block)        Anesthesia Quick Evaluation

## 2013-08-27 NOTE — Progress Notes (Signed)
Called Dr.Jackson for post op sign out

## 2013-08-27 NOTE — Anesthesia Postprocedure Evaluation (Signed)
  Anesthesia Post-op Note  Patient: Wendy [name]  Procedure(s) Performed: Procedure(s): OPEN REDUCTION INTERNAL FIXATION (ORIF) LEFT HUMERUS  (Left)  Patient Location: PACU  Anesthesia Type:General  Level of Consciousness: awake, alert , oriented and patient cooperative  Airway and Oxygen Therapy: Patient Spontanous Breathing and Patient connected to nasal cannula oxygen  Post-op Pain: mild  Post-op Assessment: Post-op Vital signs reviewed, Patient's Cardiovascular Status Stable, Respiratory Function Stable, Patent Airway, No signs of Nausea or vomiting and Pain level controlled  Post-op Vital Signs: Reviewed and stable  Last Vitals:  Filed Vitals:   08/27/13 1440  BP: 95/49  Pulse: 64  Temp:   Resp: 9    Complications: No apparent anesthesia complications

## 2013-08-27 NOTE — Progress Notes (Signed)
Wendy Senter PA to get order for continuous pulse ox on floor due to sleep apnea hx.

## 2013-08-27 NOTE — Transfer of Care (Signed)
Immediate Anesthesia Transfer of Care Note  Patient: Wendy [name]  Procedure(s) Performed: Procedure(s): OPEN REDUCTION INTERNAL FIXATION (ORIF) LEFT HUMERUS  (Left)  Patient Location: PACU  Anesthesia Type:General  Level of Consciousness: awake, alert  and oriented  Airway & Oxygen Therapy: Patient Spontanous Breathing and Patient connected to nasal cannula oxygen  Post-op Assessment: Report given to PACU RN and Post -op Vital signs reviewed and stable  Post vital signs: Reviewed and stable  Complications: No apparent anesthesia complications

## 2013-08-28 ENCOUNTER — Encounter (HOSPITAL_COMMUNITY): Payer: Self-pay | Admitting: Orthopedic Surgery

## 2013-08-28 DIAGNOSIS — G8929 Other chronic pain: Secondary | ICD-10-CM | POA: Diagnosis present

## 2013-08-28 DIAGNOSIS — M549 Dorsalgia, unspecified: Secondary | ICD-10-CM

## 2013-08-28 DIAGNOSIS — J449 Chronic obstructive pulmonary disease, unspecified: Secondary | ICD-10-CM | POA: Diagnosis present

## 2013-08-28 DIAGNOSIS — G4730 Sleep apnea, unspecified: Secondary | ICD-10-CM | POA: Diagnosis present

## 2013-08-28 DIAGNOSIS — IMO0002 Reserved for concepts with insufficient information to code with codable children: Secondary | ICD-10-CM | POA: Diagnosis not present

## 2013-08-28 DIAGNOSIS — F419 Anxiety disorder, unspecified: Secondary | ICD-10-CM | POA: Diagnosis present

## 2013-08-28 DIAGNOSIS — I739 Peripheral vascular disease, unspecified: Secondary | ICD-10-CM | POA: Diagnosis present

## 2013-08-28 DIAGNOSIS — K219 Gastro-esophageal reflux disease without esophagitis: Secondary | ICD-10-CM | POA: Diagnosis present

## 2013-08-28 DIAGNOSIS — G629 Polyneuropathy, unspecified: Secondary | ICD-10-CM | POA: Diagnosis present

## 2013-08-28 LAB — BASIC METABOLIC PANEL
BUN: 18 mg/dL (ref 6–23)
CO2: 24 mEq/L (ref 19–32)
Calcium: 8.8 mg/dL (ref 8.4–10.5)
Chloride: 99 mEq/L (ref 96–112)
Creatinine, Ser: 0.89 mg/dL (ref 0.50–1.10)
GFR calc Af Amer: 79 mL/min — ABNORMAL LOW (ref >90)
GFR calc non Af Amer: 69 mL/min — ABNORMAL LOW (ref >90)
Glucose, Bld: 113 mg/dL — ABNORMAL HIGH (ref 70–99)
Potassium: 4.2 mEq/L (ref 3.7–5.3)
Sodium: 138 mEq/L (ref 137–147)

## 2013-08-28 LAB — CBC
HCT: 33.9 % — ABNORMAL LOW (ref 36.0–46.0)
Hemoglobin: 10.8 g/dL — ABNORMAL LOW (ref 12.0–15.0)
MCH: 30.3 pg (ref 26.0–34.0)
MCHC: 31.9 g/dL (ref 30.0–36.0)
MCV: 95.2 fL (ref 78.0–100.0)
Platelets: 222 10*3/uL (ref 150–400)
RBC: 3.56 MIL/uL — ABNORMAL LOW (ref 3.87–5.11)
RDW: 14.4 % (ref 11.5–15.5)
WBC: 9.2 10*3/uL (ref 4.0–10.5)

## 2013-08-28 LAB — VITAMIN D 25 HYDROXY (VIT D DEFICIENCY, FRACTURES): Vit D, 25-Hydroxy: 55 ng/mL (ref 30–89)

## 2013-08-28 MED ORDER — OXYCODONE-ACETAMINOPHEN 5-325 MG PO TABS
1.0000 | ORAL_TABLET | Freq: Four times a day (QID) | ORAL | Status: DC | PRN
  Administered 2013-08-28: 2 via ORAL
  Filled 2013-08-28: qty 2

## 2013-08-28 MED ORDER — DSS 100 MG PO CAPS: 100.0000 mg | ORAL_CAPSULE | Freq: Two times a day (BID) | ORAL | Status: DC

## 2013-08-28 MED ORDER — METHOCARBAMOL 500 MG PO TABS: 500.0000 mg | ORAL_TABLET | Freq: Four times a day (QID) | ORAL | Status: DC | PRN

## 2013-08-28 MED ORDER — OXYCODONE HCL 5 MG PO TABS: 5.0000 mg | ORAL_TABLET | ORAL | Status: DC | PRN

## 2013-08-28 MED ORDER — OXYCODONE-ACETAMINOPHEN 5-325 MG PO TABS: 1.0000 | ORAL_TABLET | Freq: Four times a day (QID) | ORAL | Status: DC | PRN

## 2013-08-28 NOTE — Progress Notes (Addendum)
Pt continues to complain of severe pain in left arm. All pain  meds were given with little relief. Rachel notified. Added IV tylenol to pt's scheduled pain meds. Tylenol and Morphine 1 mg seemed to be effective since pt fell asleep shortly after it was given. O2 sats remain at 99%.

## 2013-08-28 NOTE — Discharge Instructions (Signed)
[facility] Discharge Instructions   General Discharge Instructions  WEIGHT BEARING STATUS: Nonweightbearing Left arm  RANGE OF MOTION/ACTIVITY: no active abduction Left shoulder. Passive shoulder motion and pendulums ok.  Active motion of L elbow, forearm, wrist and hand encouraged.  Sling for comfort only. Come out of sling several times a day to work on range of motion   Diet: as you were eating previously.  Can use over the counter stool softeners and bowel preparations, such as Miralax, to help with bowel movements.  Narcotics can be constipating.  Be sure to drink plenty of fluids  STOP SMOKING OR USING NICOTINE PRODUCTS!!!!  As discussed nicotine severely impairs your body's ability to heal surgical and traumatic wounds but also impairs bone healing.  Wounds and bone heal by forming microscopic blood vessels (angiogenesis) and nicotine is a vasoconstrictor (essentially, shrinks blood vessels).  Therefore, if vasoconstriction occurs to these microscopic blood vessels they essentially disappear and are unable to deliver necessary nutrients to the healing tissue.  This is one modifiable factor that you can do to dramatically increase your chances of healing your injury.    (This means no smoking, no nicotine gum, patches, etc)  DO NOT USE NONSTEROIDAL ANTI-INFLAMMATORY DRUGS (NSAID'S)  Using products such as Advil (ibuprofen), Aleve (naproxen), Motrin (ibuprofen) for additional pain control during fracture healing can delay and/or prevent the healing response.  If you would like to take over the counter (OTC) medication, Tylenol (acetaminophen) is ok.  However, some narcotic medications that are given for pain control contain acetaminophen as well. Therefore, you should not exceed more than 4000 mg of tylenol in a day if you do not have liver disease.  Also note that there are may OTC medicines, such as cold medicines and allergy medicines that my contain tylenol as well.  If you  have any questions about medications and/or interactions please ask your doctor/PA or your pharmacist.   PAIN MEDICATION USE AND EXPECTATIONS  You have likely been given narcotic medications to help control your pain.  After a traumatic event that results in an fracture (broken bone) with or without surgery, it is ok to use narcotic pain medications to help control one's pain.  We understand that everyone responds to pain differently and each individual patient will be evaluated on a regular basis for the continued need for narcotic medications. Ideally, narcotic medication use should last no more than 6-8 weeks (coinciding with fracture healing).   As a patient it is your responsibility as well to monitor narcotic medication use and report the amount and frequency you use these medications when you come to your office visit.   We would also advise that if you are using narcotic medications, you should take a dose prior to therapy to maximize you participation.  IF YOU ARE ON NARCOTIC MEDICATIONS IT IS NOT PERMISSIBLE TO OPERATE A MOTOR VEHICLE (MOTORCYCLE/CAR/TRUCK/MOPED) OR HEAVY MACHINERY DO NOT MIX NARCOTICS WITH OTHER CNS (CENTRAL NERVOUS SYSTEM) DEPRESSANTS SUCH AS ALCOHOL       ICE AND ELEVATE INJURED/OPERATIVE EXTREMITY  Using ice and elevating the injured extremity above your heart can help with swelling and pain control.  Icing in a pulsatile fashion, such as 20 minutes on and 20 minutes off, can be followed.    Do not place ice directly on skin. Make sure there is a barrier between to skin and the ice pack.    Using frozen items such as frozen peas works well as the conform nicely to the  are that needs to be iced.  USE AN ACE WRAP OR TED HOSE FOR SWELLING CONTROL  In addition to icing and elevation, Ace wraps or TED hose are used to help limit and resolve swelling.  It is recommended to use Ace wraps or TED hose until you are informed to stop.    When using Ace Wraps start the wrapping  distally (farthest away from the body) and wrap proximally (closer to the body)   Example: If you had surgery on your leg or thing and you do not have a splint on, start the ace wrap at the toes and work your way up to the thigh        If you had surgery on your upper extremity and do not have a splint on, start the ace wrap at your fingers and work your way up to the upper arm  IF YOU ARE IN A SPLINT OR CAST DO NOT [place]   If your splint gets wet for any reason please contact the office immediately. You may shower in your splint or cast as long as you keep it dry.  This can be done by wrapping in a cast cover or garbage back (or similar)  Do Not stick any thing down your splint or cast such as pencils, money, or hangers to try and scratch yourself with.  If you feel itchy take benadryl as prescribed on the bottle for itching  IF YOU ARE IN A CAM BOOT (BLACK BOOT)  You may remove boot periodically. Perform daily dressing changes as noted below.  Wash the liner of the boot regularly and wear a sock when wearing the boot. It is recommended that you sleep in the boot until told otherwise  CALL THE OFFICE WITH ANY QUESTIONS OR CONCERTS: [phone]     Discharge Pin Site Instructions  Dress pins daily with Kerlix roll starting on POD 2. Wrap the Kerlix so that it tamps the skin down around the pin-skin interface to prevent/limit motion of the skin relative to the pin.  (Pin-skin motion is the primary cause of pain and infection related to external fixator pin sites).  Remove any crust or coagulum that may obstruct drainage with a saline moistened gauze or soap and water.  After POD 3, if there is no discernable drainage on the pin site dressing, the interval for change can by increased to every other day.  You may shower with the fixator, cleaning all pin sites gently with soap and water.  If you have a surgical wound this needs to be completely dry and without drainage before  showering.  The extremity can be lifted by the fixator to facilitate wound care and transfers.  Notify the office/Doctor if you experience increasing drainage, redness, or pain from a pin site, or if you notice purulent (thick, snot-like) drainage.  Discharge Wound Care Instructions  Do NOT apply any ointments, solutions or lotions to pin sites or surgical wounds.  These prevent needed drainage and even though solutions like hydrogen peroxide kill bacteria, they also damage cells lining the pin sites that help fight infection.  Applying lotions or ointments can keep the wounds moist and can cause them to breakdown and open up as well. This can increase the risk for infection. When in doubt call the office.  Surgical incisions should be dressed daily.  If any drainage is noted, use one layer of adaptic, then gauze, Kerlix, and an ace wrap.  Once the incision is  completely dry and without drainage, it may be left open to air out.  Showering may begin 36-48 hours later.  Cleaning gently with soap and water.  Traumatic wounds should be dressed daily as well.    One layer of adaptic, gauze, Kerlix, then ace wrap.  The adaptic can be discontinued once the draining has ceased    If you have a wet to dry dressing: wet the gauze with saline the squeeze as much saline out so the gauze is moist (not soaking wet), place moistened gauze over wound, then place a dry gauze over the moist one, followed by Kerlix wrap, then ace wrap.

## 2013-08-28 NOTE — Progress Notes (Signed)
[facility] Progress Note  Subjective  Doing much better this am Pain improved Eating breakfast  No complaints Wants to go home today    Review of Systems  Constitutional: Negative for fever and chills.  Respiratory: Negative for shortness of breath and wheezing.   Cardiovascular: Negative for chest pain and palpitations.  Gastrointestinal: Negative for nausea and vomiting.  Genitourinary: Negative for dysuria.  Musculoskeletal:       L arm pain   Neurological: Negative for tingling and sensory change.     Objective   BP 111/54  Pulse 70  Temp(Src) 97.5 F (36.4 C) (Oral)  Resp 18  Ht 5\' 2"  (1.575 m)  Wt 88.451 kg (195 lb)  BMI 35.66 kg/m2  SpO2 98%  Intake/Output     06/23 0701 - 06/24 0700 06/24 0701 - 06/25 0700   P.O. 1130    I.V. (mL/kg) 1962.2 (22.2)    Total Intake(mL/kg) 3092.2 (35)    Other 800    Blood 275    Total Output 1075     Net +2017.2          Urine Occurrence 2 x      Labs  Results for RAMINA, HULET (MRN [id number]) as of 08/28/2013 08:09  Ref. Range 08/28/2013 05:42  Sodium Latest Range: 137-147 mEq/L 138  Potassium Latest Range: 3.7-5.3 mEq/L 4.2  Chloride Latest Range: 96-112 mEq/L 99  CO2 Latest Range: 19-32 mEq/L 24  BUN Latest Range: 6-23 mg/dL 18  Creatinine Latest Range: 0.50-1.10 mg/dL 0.89  Calcium Latest Range: 8.4-10.5 mg/dL 8.8  GFR calc non Af Amer Latest Range: >90 mL/min 69 (L)  GFR calc Af Amer Latest Range: >90 mL/min 79 (L)  Glucose Latest Range: 70-99 mg/dL 113 (H)  WBC Latest Range: 4.0-10.5 K/uL 9.2  RBC Latest Range: 3.87-5.11 MIL/uL 3.56 (L)  Hemoglobin Latest Range: 12.0-15.0 g/dL 10.8 (L)  HCT Latest Range: 36.0-46.0 % 33.9 (L)  MCV Latest Range: 78.0-100.0 fL 95.2  MCH Latest Range: 26.0-34.0 pg 30.3  MCHC Latest Range: 30.0-36.0 g/dL 31.9  RDW Latest Range: 11.5-15.5 % 14.4  Platelets Latest Range: 150-400 K/uL 222    Exam  Gen: awake and alert, NAD, eating breakfast, sitting  up, appears very comfortable  Lungs: clear B  Cardiac: RRR, s1 and s2 Abd: + BS, NTND Ext:        Left Upper Extremity   Dressing stable  Sling fitting well  R/U/M motor and sensory functions intact  Ax sensation intact  Ext warm  Mild swelling L upper extremity   + Radial pulse   Good elbow motion     Assessment and Plan   POD/HD#: 1  1. L humeral shaft nonunion s/p ORIF   No active shoulder abduction  Gentle passive shoulder motion and pendulums  Active elbow, forearm, wrist and hand motion  Ice and ace prn  Elevate extremity to dec swelling  OT eval before dc   Sling for comfort- pt needs to come out of sling several times a day to work on elbow and forearm motion  Will arrange for bone stimulator   Dressing change in 2 days- can wash with soap and water    2. Pain management:  Percocet 5/325 1-2 po q6h prn  Oxy IR 5-10 mg po q3h prn breakthrough pain   Robaxin [phone] mg po q6h prn   3. ABL anemia/Hemodynamics  Stable  4. Medical issues   Stable   No apparent post op cardiopulmonary complications  5. DVT/PE prophylaxis:  No pharmacologic dvt prophylaxis needed at discharge  Restart plavix tomorrow  6. ID:   Completed periop abx  7. Metabolic Bone Disease:  Outpt dexa as outpt  OTC vitamin D    8. Activity:   as per #1  9. FEN/Foley/Lines:  Heart healthy diet  Dc IV and IVF  10.Ex-fix/Splint care:   dressing change in 2 days  Ok to shower  See dc instructions for wound care recs    12. Dispo:  Dc home today after therapy  Follow up in 10-14 days with ortho     Wendy Pigg, PA-C [facility] [phone] (P) 08/28/2013 8:07 AM  **Disclaimer: This note may have been dictated with voice recognition software. Similar sounding words can inadvertently be transcribed and this note may contain transcription errors which may not have been corrected upon publication of note.**

## 2013-08-28 NOTE — Evaluation (Signed)
Occupational Therapy Evaluation and Discharge Patient Details Name: Wendy [name] MRN: [id number] DOB: 09-04-1950 Today's Date: 08/28/2013    History of Present Illness s/p R humerus ORIF for longstanding fx   Clinical Impression   Pt and caregiver educated in compensatory strategies for bathing and dressing without use of L UE, positioning in bed and chair, sling use, pendulum exercises and gentle PROM ofL shoulder.  Pt attempted, but not able to tolerate practice of these, but performed AROM of elbow to hand and verbalized understanding. Pt also able to verbalize no active shoulder abduction.  Pt has excellent support at home.     Follow Up Recommendations  No OT follow up    Equipment Recommendations  None recommended by OT    Recommendations for Other Services       Precautions / Restrictions Precautions Precautions: Shoulder Type of Shoulder Precautions: No active abduction, may WB for mobility as needed, pendulums, ADL, sling education, elevation of L UE, gently PROM Shoulder Interventions: Shoulder sling/immobilizer Precaution Booklet Issued: Yes (comment) Restrictions Weight Bearing Restrictions: Yes LUE Weight Bearing: Weight bearing as tolerated      Mobility Bed Mobility Overal bed mobility: Needs Assistance Bed Mobility: Supine to Sit     Supine to sit: Min assist     General bed mobility comments: instructed pt to get up to R side at home  Transfers Overall transfer level: Needs assistance   Transfers: Sit to/from Stand Sit to Stand: Supervision (on Morph[employer])              Balance                                            ADL Overall ADL's : Needs assistance/impaired Eating/Feeding: Set up;Sitting   Grooming: Wash/dry hands;Wash/dry face;Supervision/safety;Standing;Brushing hair   Upper Body Bathing: Minimal assitance;Sitting;Cueing for UE precautions   Lower Body Bathing: Supervison/ safety;Sit to/from stand    Upper Body Dressing : Minimal assistance;Sitting;Cueing for UE precautions   Lower Body Dressing: Sit to/from stand;Supervision/safety   Toilet Transfer: Supervision/safety;Ambulation   Toileting- Clothing Manipulation and Hygiene: Supervision/safety;Sit to/from stand       Functional mobility during ADLs: Supervision/safety General ADL Comments: Instructed in compensatory strategies for ADL and sling donning and doffing.  Friend observed entire session and both pt and friend verbalized understanding.     Vision                     Perception     Praxis      Pertinent Vitals/Pain Moderate pain, L shoulder, repositioned and iced, premedicated     Hand Dominance Right   Extremity/Trunk Assessment Upper Extremity Assessment Upper Extremity Assessment: LUE deficits/detail LUE Deficits / Details: s/p ORIF of humerus LUE: Unable to fully assess due to immobilization;Unable to fully assess due to pain LUE Coordination: decreased gross motor   Lower Extremity Assessment Lower Extremity Assessment: Overall WFL for tasks assessed   Cervical / Trunk Assessment Cervical / Trunk Assessment: Normal   Communication Communication Communication: No difficulties   Cognition Arousal/Alertness: Awake/alert Behavior During Therapy: WFL for tasks assessed/performed Overall Cognitive Status: Within Functional Limits for tasks assessed                     General Comments       Exercises Exercises:  (Instructed in pendulums and gentle PROM of  L shoulder)     Shoulder Instructions      Home Living Family/patient expects to be discharged to:: Private residence Living Arrangements: Non-relatives/Friends;Other relatives Available Help at Discharge: Friend(s);Available 24 hours/day;Family                         Home Equipment: Shower seat;Hospital bed          Prior Functioning/Environment Level of Independence: Independent             OT  Diagnosis:     OT Problem List:     OT Treatment/Interventions:      OT Goals(Current goals can be found in the care plan section) Acute Rehab OT Goals Patient Stated Goal: home today  OT Frequency:     Barriers to D/C:            Co-evaluation              End of Session Nurse Communication: Mobility status (OT education completed)  Activity Tolerance: Patient limited by pain Patient left: in bed;with call bell/phone within reach;with family/visitor present   Time: 0712-1975 OT Time Calculation (min): 60 min Charges:  OT General Charges $OT Visit: 1 Procedure OT Evaluation $Initial OT Evaluation Tier I: 1 Procedure OT Treatments $Self Care/Home Management : 23-37 mins G-Codes: OT G-codes **NOT FOR INPATIENT CLASS** Functional Assessment Tool Used: clinical judgement Functional Limitation: Self care Self Care Current Status (O8325): At least 1 percent but less than 20 percent impaired, limited or restricted Self Care Goal Status (Q9826): At least 1 percent but less than 20 percent impaired, limited or restricted Self Care Discharge Status [phone]): At least 1 percent but less than 20 percent impaired, limited or restricted  Malka So 08/28/2013, 11:11 AM [phone]

## 2013-08-28 NOTE — Op Note (Signed)
Wendy [name]              ACCOUNT NO.:  [id number]  MEDICAL RECORD NO.:  [id number]  LOCATION:  [id number]                        FACILITY:  [facility]  PHYSICIAN:  Astrid Divine. Marcelino Scot, M.D. DATE OF BIRTH:  [birth year]  DATE OF PROCEDURE:  08/27/2013 DATE OF DISCHARGE:                              OPERATIVE REPORT   PREOPERATIVE DIAGNOSIS:  Long-standing left humeral shaft nonunion.  POSTOPERATIVE DIAGNOSIS:  Long-standing left humeral shaft nonunion.  PROCEDURE:  Repair of left humerus nonunion.  SURGEON:  Altamese Quincy, MD  ASSISTANT:  None.  ANESTHESIA:  General.  COMPLICATIONS:  None.  I/O:  1000 mL of crystalloid, EBL 275. GI 800 mL.  SPECIMENS:  Pseudocapsule left humerus nonunion.  FINDINGS:  No bacteria.  DISPOSITION:  To PACU.  CONDITION:  Stable.  BRIEF SUMMARY AND INDICATION OF PROCEDURE:  Wendy [name] is a 63 year old, right-hand dominant female who sustained a left humerus fracture 2 years ago and underwent prolonged effort at nonsurgical management which was unsuccessful. The patient also had a heart attack over that period of time.  I discussed with her the risks and benefits of surgical repair including possibility of failure of that repair, nerve injury particularly of the radial nerve, infection, other neurovascular injury, stroke, and many others.  She acknowledges these risk and did wish to proceed.  BRIEF DESCRIPTION OF PROCEDURE:  Wendy [name] was given preoperative antibiotics and taken to the operating room, and general anesthesia was induced.  Her left upper extremity was prepped and draped in usual sterile fashion on a radiolucent table.  A standard anterior approach was made to the humerus which was made significantly more difficult because of her morbid obesity.  I carried dissection down to the fascia which was incised.  I mobilized the biceps medially and attempted to split the brachialis.  Architecture was obscured by the  prolonged pseudocapsule and deformity as well as some hypertrophy of the bunions themselves.  The pseudocapsule was exposed, the bunions exposed carefully using a 10 blade and limited cautery which preserved as much blood supply as possible.  This was curettaged as well.  I re- established the canal with a 3.5 drill distally.  I used similar means proximally and then with a bone bur removed the avascular cortical bone back to bleeding bone.  I then applied a long 3.5 locked steel plate from [employer] used to clamp visually to check my reduction.  Following that placement of a screw distally and then a buttress screw just distal to the fracture to key in the distal tip of the proximal segment.  My assistant pulled maximal traction during that time to restore proper length and rotation.  This was checked for position on AP and lateral images.  I did contour the plate.  It should be noted that any clamp placement was done under direct visualization and with complete clearance of the soft tissues.  I was very careful to avoid any injury to the neurovascular structures.  Standard fixation was placed proximally and maximally apposed the plate to the bone and then locked fixation including 3 locked screws, 2 of which were bicortical and 1 additional unicortical screw more distally in the proximal fragment.  In  the distal fragment, 2 standard screws and 2 locked screws were placed at the bouts of fixation.  The large bone defect was then grafted with the largest infuse sponge available and 40 mL of cancellous graft. Final images showed appropriate reduction, hardware trajectory, and length.  The brachialis was reapproximated and then the deep wound with 0 Vicryl, 2-0 Vicryl, and staples for the skin.  The sterile gently compressive dressing was applied.  The patient was then awakened from anesthesia and transported to the PACU in stable condition.  PROGNOSIS:  Wendy [name] has a host of medical  problems and is at elevated risk for perioperative complications as well as persistent nonunion.  She will be in a sling with unrestricted elbow, forearm, wrist, and hand motion. We will plan to remove her staples in 2 weeks if her wound has adequately sealed, and we will begin passive motion of the shoulder immediately as well and transition to active assisted at approximately 6 weeks.     Astrid Divine. Marcelino Scot, M.D.     MHH/MEDQ  D:  08/27/2013  T:  08/28/2013  Job:  [id number]

## 2013-08-28 NOTE — Care Management Note (Signed)
CARE MANAGEMENT NOTE 08/28/2013  Patient:  Wendy [name], Wendy [name]   Account Number:  [id number]  Date Initiated:  08/28/2013  Documentation initiated by:  Ricki Miller  Subjective/Objective Assessment:   64 yr old female s/p left humerus non union repair     Action/Plan:   No home health needs identified. Patient being discharged home.   Anticipated DC Date:  08/28/2013   Anticipated DC Plan:  [place]  CM consult      PAC Choice  NA   Choice offered to / List presented to:     DME arranged  NA        HH arranged  NA      Status of service:  Completed, signed off Medicare Important Message given?   (If response is "NO", the following Medicare IM given date fields will be blank) Date Medicare IM given:   Date Additional Medicare IM given:    Discharge Disposition:  HOME/SELF CARE

## 2013-08-28 NOTE — Discharge Summary (Signed)
[facility] (OTS)  Patient ID: Wendy [name] MRN: [id number] DOB/AGE: 11/03/1950 63 y.o.  Admit date: 08/27/2013 Discharge date: 08/28/2013  Admission Diagnoses: Chronic left humerus nonunion, atrophic CAD Hypertension Hyperlipidemia Peripheral artery disease COPD Peripheral neuropathy Chronic back and leg pain Sleep apnea Anxiety Gastroesophageal reflux disease  Discharge Diagnoses:  Principal Problem:   Nonunion, fracture Active Problems:   CAD (coronary artery disease)   Hypertension   HLD (hyperlipidemia)   PAD (peripheral artery disease)   COPD (chronic obstructive pulmonary disease)   Peripheral neuropathy   Chronic back pain   Sleep apnea   Anxiety   GERD (gastroesophageal reflux disease)   Procedures Performed: 08/28/2013-Dr. Marcelino Scot  1. Repair of left humerus nonunion   Discharged Condition: good  Hospital Course:    Patient is a 63 year old white female Who presented to the [facility] with a chronic left humerus nonunion. After a thorough medical workup was performed along with cardiac clearance patient was taken to the operating room for repair for left humerus nonunion. Patient was taken to the operating room on 08/28/2013 with the procedure above was performed. Her fixation was augmented with allograft. After surgery she was.to PACU for recovery from anesthesia and was transferred to the orthopedic floor for continued observation, pain control and therapies. On postoperative day #1 patient was doing very well with adequate pain control. She was requesting to be discharged home. Patient work with occupational therapy prior to departure. She was discharged in stable condition. Tolerating regular diet and voiding on her own at the time of discharge. All instructions and expectations have been reviewed with the patient. She is agreeable with discharge today.   Consults: None  Significant Diagnostic Studies: labs:    Results for Wendy, [name] (MRN [id number]) as of 08/28/2013 09:33  Ref. Range 08/28/2013 05:42  Sodium Latest Range: 137-147 mEq/L 138  Potassium Latest Range: 3.7-5.3 mEq/L 4.2  Chloride Latest Range: 96-112 mEq/L 99  CO2 Latest Range: 19-32 mEq/L 24  BUN Latest Range: 6-23 mg/dL 18  Creatinine Latest Range: 0.50-1.10 mg/dL 0.89  Calcium Latest Range: 8.4-10.5 mg/dL 8.8  GFR calc non Af Amer Latest Range: >90 mL/min 69 (L)  GFR calc Af Amer Latest Range: >90 mL/min 79 (L)  Glucose Latest Range: 70-99 mg/dL 113 (H)  WBC Latest Range: 4.0-10.5 K/uL 9.2  RBC Latest Range: 3.87-5.11 MIL/uL 3.56 (L)  Hemoglobin Latest Range: 12.0-15.0 g/dL 10.8 (L)  HCT Latest Range: 36.0-46.0 % 33.9 (L)  MCV Latest Range: 78.0-100.0 fL 95.2  MCH Latest Range: 26.0-34.0 pg 30.3  MCHC Latest Range: 30.0-36.0 g/dL 31.9  RDW Latest Range: 11.5-15.5 % 14.4  Platelets Latest Range: 150-400 K/uL 222    Treatments: IV hydration, antibiotics: vancomycin, analgesia: acetaminophen, Dilaudid and oxycodone, cardiac meds: metoprolol, anticoagulation: LMW heparin, respiratory therapy: CPAP, therapies: PT, OT and RN and surgery: As above   Discharge Exam:     [facility] Progress Note  Subjective  Doing much better this am Pain improved Eating breakfast   No complaints Wants to go home today    Review of Systems  Constitutional: Negative for fever and chills.  Respiratory: Negative for shortness of breath and wheezing.   Cardiovascular: Negative for chest pain and palpitations.  Gastrointestinal: Negative for nausea and vomiting.  Genitourinary: Negative for dysuria.  Musculoskeletal:        L arm pain   Neurological: Negative for tingling and sensory change.     Objective   BP  111/54  Pulse 70  Temp(Src) 97.5 F (36.4 C) (Oral)  Resp 18  Ht 5' 2"  (1.575 m)  Wt 88.451 kg (195 lb)  BMI 35.66 kg/m2  SpO2 98%  Intake/Output     06/23 0701 - 06/24 0700 06/24 0701 - 06/25  0700    P.O. 1130     I.V. (mL/kg) 1962.2 (22.2)     Total Intake(mL/kg) 3092.2 (35)     Other 800     Blood 275     Total Output 1075      Net +2017.2            Urine Occurrence 2 x       Labs  Results for Wendy, [name] (MRN [id number]) as of 08/28/2013 08:09   Ref. Range  08/28/2013 05:42   Sodium  Latest Range: 137-147 mEq/L  138   Potassium  Latest Range: 3.7-5.3 mEq/L  4.2   Chloride  Latest Range: 96-112 mEq/L  99   CO2  Latest Range: 19-32 mEq/L  24   BUN  Latest Range: 6-23 mg/dL  18   Creatinine  Latest Range: 0.50-1.10 mg/dL  0.89   Calcium  Latest Range: 8.4-10.5 mg/dL  8.8   GFR calc non Af Amer  Latest Range: >90 mL/min  69 (L)   GFR calc Af Amer  Latest Range: >90 mL/min  79 (L)   Glucose  Latest Range: 70-99 mg/dL  113 (H)   WBC  Latest Range: 4.0-10.5 K/uL  9.2   RBC  Latest Range: 3.87-5.11 MIL/uL  3.56 (L)   Hemoglobin  Latest Range: 12.0-15.0 g/dL  10.8 (L)   HCT  Latest Range: 36.0-46.0 %  33.9 (L)   MCV  Latest Range: 78.0-100.0 fL  95.2   MCH  Latest Range: 26.0-34.0 pg  30.3   MCHC  Latest Range: 30.0-36.0 g/dL  31.9   RDW  Latest Range: 11.5-15.5 %  14.4   Platelets  Latest Range: 150-400 K/uL  222     Exam  Gen: awake and alert, NAD, eating breakfast, sitting up, appears very comfortable   Lungs: clear B   Cardiac: RRR, s1 and s2 Abd: + BS, NTND Ext:         Left Upper Extremity               Dressing stable             Sling fitting well             R/U/M motor and sensory functions intact             Ax sensation intact             Ext warm             Mild swelling L upper extremity               + Radial pulse               Good elbow motion     Assessment and Plan   POD/HD#: 1  1. L humeral shaft nonunion s/p ORIF               No active shoulder abduction             Gentle passive shoulder motion and pendulums             Active elbow, forearm, wrist and hand motion  Ice and ace prn             Elevate  extremity to dec swelling             OT eval before dc               Sling for comfort- pt needs to come out of sling several times a day to work on elbow and forearm motion             Will arrange for bone stimulator               Dressing change in 2 days- can wash with soap and water                2. Pain management:             Percocet 5/325 1-2 po q6h prn             Oxy IR 5-10 mg po q3h prn breakthrough pain               Robaxin [phone] mg po q6h prn   3. ABL anemia/Hemodynamics             Stable  4. Medical issues               Stable               No apparent post op cardiopulmonary complications  5. DVT/PE prophylaxis:             No pharmacologic dvt prophylaxis needed at discharge             Restart plavix tomorrow  6. ID:               Completed periop abx  7. Metabolic Bone Disease:             Outpt dexa as outpt             OTC vitamin D                8. Activity:              as per #1  9. FEN/Foley/Lines:             Heart healthy diet             Dc IV and IVF  10.Ex-fix/Splint care:              dressing change in 2 days             Ok to shower             See dc instructions for wound care recs    12. Dispo:             Dc home today after therapy             Follow up in 10-14 days with ortho       Disposition: 01-Home or Self Care  Discharge Instructions   Call MD / Call 911    Complete by:  As directed   If you experience chest pain or shortness of breath, CALL 911 and be transported to the hospital emergency room.  If you develope a fever above 101 F, pus (white drainage) or increased drainage or redness at the wound, or calf pain, call your surgeon's office.     Constipation Prevention    Complete by:  As directed   Drink  plenty of fluids.  Prune juice may be helpful.  You may use a stool softener, such as Colace (over the counter) 100 mg twice a day.  Use MiraLax (over the counter) for constipation as needed.     Diet -  low sodium heart healthy    Complete by:  As directed      Discharge instructions    Complete by:  As directed   [facility] Discharge Instructions   General Discharge Instructions  WEIGHT BEARING STATUS: Nonweightbearing Left arm  RANGE OF MOTION/ACTIVITY: no active abduction Left shoulder. Passive shoulder motion and pendulums ok.  Active motion of L elbow, forearm, wrist and hand encouraged.  Sling for comfort only. Come out of sling several times a day to work on range of motion   Diet: as you were eating previously.  Can use over the counter stool softeners and bowel preparations, such as Miralax, to help with bowel movements.  Narcotics can be constipating.  Be sure to drink plenty of fluids  STOP SMOKING OR USING NICOTINE PRODUCTS!!!!  As discussed nicotine severely impairs your body's ability to heal surgical and traumatic wounds but also impairs bone healing.  Wounds and bone heal by forming microscopic blood vessels (angiogenesis) and nicotine is a vasoconstrictor (essentially, shrinks blood vessels).  Therefore, if vasoconstriction occurs to these microscopic blood vessels they essentially disappear and are unable to deliver necessary nutrients to the healing tissue.  This is one modifiable factor that you can do to dramatically increase your chances of healing your injury.    (This means no smoking, no nicotine gum, patches, etc)  DO NOT USE NONSTEROIDAL ANTI-INFLAMMATORY DRUGS (NSAID'S)  Using products such as Advil (ibuprofen), Aleve (naproxen), Motrin (ibuprofen) for additional pain control during fracture healing can delay and/or prevent the healing response.  If you would like to take over the counter (OTC) medication, Tylenol (acetaminophen) is ok.  However, some narcotic medications that are given for pain control contain acetaminophen as well. Therefore, you should not exceed more than 4000 mg of tylenol in a day if you do not have liver disease.  Also note that  there are may OTC medicines, such as cold medicines and allergy medicines that my contain tylenol as well.  If you have any questions about medications and/or interactions please ask your doctor/PA or your pharmacist.   PAIN MEDICATION USE AND EXPECTATIONS  You have likely been given narcotic medications to help control your pain.  After a traumatic event that results in an fracture (broken bone) with or without surgery, it is ok to use narcotic pain medications to help control one's pain.  We understand that everyone responds to pain differently and each individual patient will be evaluated on a regular basis for the continued need for narcotic medications. Ideally, narcotic medication use should last no more than 6-8 weeks (coinciding with fracture healing).   As a patient it is your responsibility as well to monitor narcotic medication use and report the amount and frequency you use these medications when you come to your office visit.   We would also advise that if you are using narcotic medications, you should take a dose prior to therapy to maximize you participation.  IF YOU ARE ON NARCOTIC MEDICATIONS IT IS NOT PERMISSIBLE TO OPERATE A MOTOR VEHICLE (MOTORCYCLE/CAR/TRUCK/MOPED) OR HEAVY MACHINERY DO NOT MIX NARCOTICS WITH OTHER CNS (CENTRAL NERVOUS SYSTEM) DEPRESSANTS SUCH AS ALCOHOL       ICE AND ELEVATE INJURED/OPERATIVE EXTREMITY  Using ice and  elevating the injured extremity above your heart can help with swelling and pain control.  Icing in a pulsatile fashion, such as 20 minutes on and 20 minutes off, can be followed.    Do not place ice directly on skin. Make sure there is a barrier between to skin and the ice pack.    Using frozen items such as frozen peas works well as the conform nicely to the are that needs to be iced.  USE AN ACE WRAP OR TED HOSE FOR SWELLING CONTROL  In addition to icing and elevation, Ace wraps or TED hose are used to help limit and resolve swelling.  It is  recommended to use Ace wraps or TED hose until you are informed to stop.    When using Ace Wraps start the wrapping distally (farthest away from the body) and wrap proximally (closer to the body)   Example: If you had surgery on your leg or thing and you do not have a splint on, start the ace wrap at the toes and work your way up to the thigh        If you had surgery on your upper extremity and do not have a splint on, start the ace wrap at your fingers and work your way up to the upper arm  IF YOU ARE IN A SPLINT OR CAST DO NOT [place]   If your splint gets wet for any reason please contact the office immediately. You may shower in your splint or cast as long as you keep it dry.  This can be done by wrapping in a cast cover or garbage back (or similar)  Do Not stick any thing down your splint or cast such as pencils, money, or hangers to try and scratch yourself with.  If you feel itchy take benadryl as prescribed on the bottle for itching  IF YOU ARE IN A CAM BOOT (BLACK BOOT)  You may remove boot periodically. Perform daily dressing changes as noted below.  Wash the liner of the boot regularly and wear a sock when wearing the boot. It is recommended that you sleep in the boot until told otherwise  CALL THE OFFICE WITH ANY QUESTIONS OR CONCERTS: [phone]     Discharge Pin Site Instructions  Dress pins daily with Kerlix roll starting on POD 2. Wrap the Kerlix so that it tamps the skin down around the pin-skin interface to prevent/limit motion of the skin relative to the pin.  (Pin-skin motion is the primary cause of pain and infection related to external fixator pin sites).  Remove any crust or coagulum that may obstruct drainage with a saline moistened gauze or soap and water.  After POD 3, if there is no discernable drainage on the pin site dressing, the interval for change can by increased to every other day.  You may shower with the fixator, cleaning all pin sites  gently with soap and water.  If you have a surgical wound this needs to be completely dry and without drainage before showering.  The extremity can be lifted by the fixator to facilitate wound care and transfers.  Notify the office/Doctor if you experience increasing drainage, redness, or pain from a pin site, or if you notice purulent (thick, snot-like) drainage.  Discharge Wound Care Instructions  Do NOT apply any ointments, solutions or lotions to pin sites or surgical wounds.  These prevent needed drainage and even though solutions like hydrogen peroxide kill bacteria, they also damage  cells lining the pin sites that help fight infection.  Applying lotions or ointments can keep the wounds moist and can cause them to breakdown and open up as well. This can increase the risk for infection. When in doubt call the office.  Surgical incisions should be dressed daily.  If any drainage is noted, use one layer of adaptic, then gauze, Kerlix, and an ace wrap.  Once the incision is completely dry and without drainage, it may be left open to air out.  Showering may begin 36-48 hours later.  Cleaning gently with soap and water.  Traumatic wounds should be dressed daily as well.    One layer of adaptic, gauze, Kerlix, then ace wrap.  The adaptic can be discontinued once the draining has ceased    If you have a wet to dry dressing: wet the gauze with saline the squeeze as much saline out so the gauze is moist (not soaking wet), place moistened gauze over wound, then place a dry gauze over the moist one, followed by Kerlix wrap, then ace wrap.     Discharge wound care:    Complete by:  As directed   Change dressing in 2 days (08/30/2013)  [place]   Discharge Wound Care Instructions  Do NOT apply any ointments, solutions or lotions to pin sites or surgical wounds.  These prevent needed drainage and even though solutions like hydrogen peroxide kill bacteria, they also damage  cells lining the pin sites that help fight infection.  Applying lotions or ointments can keep the wounds moist and can cause them to breakdown and open up as well. This can increase the risk for infection. When in doubt call the office.  Surgical incisions should be dressed daily.  If any drainage is noted, use one layer of adaptic, then gauze, Kerlix, and an ace wrap.  Once the incision is completely dry and without drainage, it may be left open to air out.  Showering may begin 36-48 hours later.  Cleaning gently with soap and water.  Traumatic wounds should be dressed daily as well.    One layer of adaptic, gauze, Kerlix, then ace wrap.  The adaptic can be discontinued once the draining has ceased    If you have a wet to dry dressing: wet the gauze with saline the squeeze as much saline out so the gauze is moist (not soaking wet), place moistened gauze over wound, then place a dry gauze over the moist one, followed by Kerlix wrap, then ace wrap.  CALL OFFICE WITH QUESTIONS OR CONCERNS [phone]     Driving restrictions    Complete by:  As directed   No driving     Increase activity slowly as tolerated    Complete by:  As directed      Lifting restrictions    Complete by:  As directed   No lifting     Non weight bearing    Complete by:  As directed   Laterality:  left  Extremity:  Upper            Medication List    STOP taking these medications       ALEVE 220 MG tablet  Generic drug:  naproxen sodium     naproxen 500 MG tablet  Commonly known as:  NAPROSYN      TAKE these medications       albuterol 108 (90 BASE) MCG/ACT inhaler  Commonly known as:  PROVENTIL HFA;VENTOLIN HFA  Inhale 2  puffs into the lungs every 6 (six) hours as needed for wheezing or shortness of breath.     ALPRAZolam 0.5 MG tablet  Commonly known as:  XANAX  Take 0.5 mg by mouth 3 (three) times daily as needed for anxiety.     aspirin EC 81 MG tablet  Take 81 mg by mouth daily.      beclomethasone 80 MCG/ACT inhaler  Commonly known as:  QVAR  Inhale 2 puffs into the lungs 2 (two) times daily.     cetirizine 10 MG tablet  Commonly known as:  ZYRTEC  Take 10 mg by mouth daily.     clopidogrel 75 MG tablet  Commonly known as:  PLAVIX  Take 75 mg by mouth daily with breakfast.     Cyanocobalamin 1000 MCG/ML Kit  Inject 1,000 mcg as directed every 30 (thirty) days. 15th of each month     DSS 100 MG Caps  Take 100 mg by mouth 2 (two) times daily.     ezetimibe 10 MG tablet  Commonly known as:  ZETIA  Take 10 mg by mouth daily.     FISH OIL + D3 PO  Take 1 capsule by mouth daily.     Fluticasone-Salmeterol 250-50 MCG/DOSE Aepb  Commonly known as:  ADVAIR  Inhale 1 puff into the lungs 2 (two) times daily.     methocarbamol 500 MG tablet  Commonly known as:  ROBAXIN  Take 1-2 tablets (500-1,000 mg total) by mouth every 6 (six) hours as needed for muscle spasms.     metoprolol 100 MG tablet  Commonly known as:  LOPRESSOR  Take 100 mg by mouth 2 (two) times daily.     mometasone-formoterol 100-5 MCG/ACT Aero  Commonly known as:  DULERA  Inhale 2 puffs into the lungs 2 (two) times daily as needed for wheezing.     nitroGLYCERIN 0.4 MG SL tablet  Commonly known as:  NITROSTAT  Place 0.4 mg under the tongue every 5 (five) minutes as needed for chest pain.     omeprazole 40 MG capsule  Commonly known as:  PRILOSEC  Take 40 mg by mouth 2 (two) times daily.     oxyCODONE 5 MG immediate release tablet  Commonly known as:  Oxy IR/ROXICODONE  Take 1-2 tablets (5-10 mg total) by mouth every 3 (three) hours as needed for breakthrough pain.     oxyCODONE-acetaminophen 5-325 MG per tablet  Commonly known as:  PERCOCET/ROXICET  Take 1-2 tablets by mouth every 6 (six) hours as needed for severe pain.     simvastatin 20 MG tablet  Commonly known as:  ZOCOR  Take 20 mg by mouth daily.     triamterene-hydrochlorothiazide 37.5-25 MG per capsule  Commonly known  as:  DYAZIDE  Take 1 capsule by mouth daily.     TUDORZA PRESSAIR 400 MCG/ACT Aepb  Generic drug:  Aclidinium Bromide  Inhale 1 puff into the lungs daily.           Follow-up Information   Follow up with HANDY,MICHAEL H, MD. Schedule an appointment as soon as possible for a visit in 2 weeks. (For wound re-check)    Specialty:  Orthopedic Surgery   Contact information:   [address] [place] [address] [phone]       Discharge Instructions and Plan:  Pt has sustained a severe injury to her Left upper extremity.  We were able to achieve a technically successful ORIF of the Left  humerus.  Pt will be NWB on L arm for 8 weeks Gentle pendulums and PROM of L shoulder only. NO ACTIVE abduction to L shoulder AROM L elbow, forearm, wrist and hand Dressing changes as needed, see wound care instructions for details Ice prn  Sling for comfort and when up out of bed Ace wrap to L arm, remove daily to allow skin to breathe  Follow up in 10-14 days, call for appointment Xrays and removal of sutures at that time Will initiate AAROM of the L shoulder around the 4-6 week mark, AROM around the 6-8 week mark and resistance activities for the L shoulder around the 8 week mark Please call the office with questions.   Signed:  Jari Pigg, PA-C [facility] [phone] (P) 08/28/2013, 9:44 AM  **Disclaimer: This note may have been dictated with voice recognition software. Similar sounding words can inadvertently be transcribed and this note may contain transcription errors which may not have been corrected upon publication of note.**

## 2013-08-29 ENCOUNTER — Encounter (HOSPITAL_COMMUNITY): Payer: Self-pay | Admitting: Orthopedic Surgery

## 2013-08-30 LAB — TISSUE CULTURE: Culture: NO GROWTH

## 2013-09-01 LAB — VITAMIN D 1,25 DIHYDROXY
Vitamin D 1, 25 (OH)2 Total: 45 pg/mL (ref 18–72)
Vitamin D2 1, 25 (OH)2: <8 pg/mL
Vitamin D3 1, 25 (OH)2: 45 pg/mL

## 2013-09-02 NOTE — Progress Notes (Signed)
I saw and examined the patient with Mr. Paul, communicating the findings and plan noted above.  Nika Yazzie, MD [facility] [phone] (p)  

## 2013-09-15 ENCOUNTER — Emergency Department (HOSPITAL_COMMUNITY): Payer: Medicare Other

## 2013-09-15 ENCOUNTER — Encounter (HOSPITAL_COMMUNITY): Payer: Self-pay | Admitting: Emergency Medicine

## 2013-09-15 ENCOUNTER — Inpatient Hospital Stay (HOSPITAL_COMMUNITY)
Admission: EM | Admit: 2013-09-15 | Discharge: 2013-09-21 | DRG: 414 | Disposition: A | Discharge status: Home / Self Care | Payer: Medicare Other | Attending: Internal Medicine | Admitting: Internal Medicine

## 2013-09-15 DIAGNOSIS — F419 Anxiety disorder, unspecified: Secondary | ICD-10-CM | POA: Diagnosis present

## 2013-09-15 DIAGNOSIS — G4733 Obstructive sleep apnea (adult) (pediatric): Secondary | ICD-10-CM | POA: Diagnosis present

## 2013-09-15 DIAGNOSIS — I10 Essential (primary) hypertension: Secondary | ICD-10-CM | POA: Diagnosis present

## 2013-09-15 DIAGNOSIS — K810 Acute cholecystitis: Secondary | ICD-10-CM | POA: Diagnosis present

## 2013-09-15 DIAGNOSIS — J9600 Acute respiratory failure, unspecified whether with hypoxia or hypercapnia: Secondary | ICD-10-CM | POA: Diagnosis present

## 2013-09-15 DIAGNOSIS — E739 Lactose intolerance, unspecified: Secondary | ICD-10-CM | POA: Diagnosis present

## 2013-09-15 DIAGNOSIS — Z87891 Personal history of nicotine dependence: Secondary | ICD-10-CM

## 2013-09-15 DIAGNOSIS — R740 Nonspecific elevation of levels of transaminase and lactic acid dehydrogenase [LDH]: Secondary | ICD-10-CM

## 2013-09-15 DIAGNOSIS — Z6836 Body mass index (BMI) 36.0-36.9, adult: Secondary | ICD-10-CM

## 2013-09-15 DIAGNOSIS — Z888 Allergy status to other drugs, medicaments and biological substances status: Secondary | ICD-10-CM

## 2013-09-15 DIAGNOSIS — I2510 Atherosclerotic heart disease of native coronary artery without angina pectoris: Secondary | ICD-10-CM

## 2013-09-15 DIAGNOSIS — D649 Anemia, unspecified: Secondary | ICD-10-CM | POA: Diagnosis present

## 2013-09-15 DIAGNOSIS — R945 Abnormal results of liver function studies: Secondary | ICD-10-CM | POA: Diagnosis present

## 2013-09-15 DIAGNOSIS — K821 Hydrops of gallbladder: Secondary | ICD-10-CM | POA: Diagnosis present

## 2013-09-15 DIAGNOSIS — F411 Generalized anxiety disorder: Secondary | ICD-10-CM | POA: Diagnosis present

## 2013-09-15 DIAGNOSIS — N390 Urinary tract infection, site not specified: Secondary | ICD-10-CM | POA: Diagnosis present

## 2013-09-15 DIAGNOSIS — I252 Old myocardial infarction: Secondary | ICD-10-CM

## 2013-09-15 DIAGNOSIS — R7401 Elevation of levels of liver transaminase levels: Secondary | ICD-10-CM | POA: Diagnosis not present

## 2013-09-15 DIAGNOSIS — G4730 Sleep apnea, unspecified: Secondary | ICD-10-CM | POA: Diagnosis present

## 2013-09-15 DIAGNOSIS — J9601 Acute respiratory failure with hypoxia: Secondary | ICD-10-CM

## 2013-09-15 DIAGNOSIS — I25810 Atherosclerosis of coronary artery bypass graft(s) without angina pectoris: Secondary | ICD-10-CM

## 2013-09-15 DIAGNOSIS — I480 Paroxysmal atrial fibrillation: Secondary | ICD-10-CM

## 2013-09-15 DIAGNOSIS — Z7982 Long term (current) use of aspirin: Secondary | ICD-10-CM

## 2013-09-15 DIAGNOSIS — M549 Dorsalgia, unspecified: Secondary | ICD-10-CM | POA: Diagnosis present

## 2013-09-15 DIAGNOSIS — K8000 Calculus of gallbladder with acute cholecystitis without obstruction: Principal | ICD-10-CM | POA: Diagnosis present

## 2013-09-15 DIAGNOSIS — G629 Polyneuropathy, unspecified: Secondary | ICD-10-CM

## 2013-09-15 DIAGNOSIS — G8929 Other chronic pain: Secondary | ICD-10-CM | POA: Diagnosis present

## 2013-09-15 DIAGNOSIS — I4891 Unspecified atrial fibrillation: Secondary | ICD-10-CM | POA: Diagnosis not present

## 2013-09-15 DIAGNOSIS — Z79899 Other long term (current) drug therapy: Secondary | ICD-10-CM

## 2013-09-15 DIAGNOSIS — J449 Chronic obstructive pulmonary disease, unspecified: Secondary | ICD-10-CM

## 2013-09-15 DIAGNOSIS — G609 Hereditary and idiopathic neuropathy, unspecified: Secondary | ICD-10-CM | POA: Diagnosis present

## 2013-09-15 DIAGNOSIS — I739 Peripheral vascular disease, unspecified: Secondary | ICD-10-CM | POA: Diagnosis present

## 2013-09-15 DIAGNOSIS — Z7902 Long term (current) use of antithrombotics/antiplatelets: Secondary | ICD-10-CM

## 2013-09-15 DIAGNOSIS — K819 Cholecystitis, unspecified: Secondary | ICD-10-CM

## 2013-09-15 DIAGNOSIS — R7402 Elevation of levels of lactic acid dehydrogenase (LDH): Secondary | ICD-10-CM | POA: Diagnosis not present

## 2013-09-15 DIAGNOSIS — M79609 Pain in unspecified limb: Secondary | ICD-10-CM | POA: Diagnosis present

## 2013-09-15 DIAGNOSIS — E785 Hyperlipidemia, unspecified: Secondary | ICD-10-CM | POA: Diagnosis present

## 2013-09-15 DIAGNOSIS — I4892 Unspecified atrial flutter: Secondary | ICD-10-CM | POA: Diagnosis not present

## 2013-09-15 DIAGNOSIS — Z951 Presence of aortocoronary bypass graft: Secondary | ICD-10-CM | POA: Diagnosis present

## 2013-09-15 DIAGNOSIS — Z8249 Family history of ischemic heart disease and other diseases of the circulatory system: Secondary | ICD-10-CM

## 2013-09-15 DIAGNOSIS — R1011 Right upper quadrant pain: Secondary | ICD-10-CM

## 2013-09-15 DIAGNOSIS — I2584 Coronary atherosclerosis due to calcified coronary lesion: Secondary | ICD-10-CM

## 2013-09-15 DIAGNOSIS — Z881 Allergy status to other antibiotic agents status: Secondary | ICD-10-CM

## 2013-09-15 DIAGNOSIS — IMO0002 Reserved for concepts with insufficient information to code with codable children: Secondary | ICD-10-CM

## 2013-09-15 DIAGNOSIS — J4489 Other specified chronic obstructive pulmonary disease: Secondary | ICD-10-CM | POA: Diagnosis present

## 2013-09-15 DIAGNOSIS — I200 Unstable angina: Secondary | ICD-10-CM

## 2013-09-15 DIAGNOSIS — K219 Gastro-esophageal reflux disease without esophagitis: Secondary | ICD-10-CM | POA: Diagnosis present

## 2013-09-15 DIAGNOSIS — Z9861 Coronary angioplasty status: Secondary | ICD-10-CM

## 2013-09-15 DIAGNOSIS — Z5331 Laparoscopic surgical procedure converted to open procedure: Secondary | ICD-10-CM

## 2013-09-15 LAB — URINE MICROSCOPIC-ADD ON

## 2013-09-15 LAB — COMPREHENSIVE METABOLIC PANEL
ALT: 22 U/L (ref 0–35)
AST: 18 U/L (ref 0–37)
Albumin: 3.5 g/dL (ref 3.5–5.2)
Alkaline Phosphatase: 98 U/L (ref 39–117)
Anion gap: 21 — ABNORMAL HIGH (ref 5–15)
BUN: 16 mg/dL (ref 6–23)
CO2: 23 mEq/L (ref 19–32)
Calcium: 9.6 mg/dL (ref 8.4–10.5)
Chloride: 99 mEq/L (ref 96–112)
Creatinine, Ser: 0.69 mg/dL (ref 0.50–1.10)
GFR calc Af Amer: >90 mL/min (ref >90)
GFR calc non Af Amer: >90 mL/min (ref >90)
Glucose, Bld: 111 mg/dL — ABNORMAL HIGH (ref 70–99)
Potassium: 3.5 mEq/L — ABNORMAL LOW (ref 3.7–5.3)
Sodium: 143 mEq/L (ref 137–147)
Total Bilirubin: 0.7 mg/dL (ref 0.3–1.2)
Total Protein: 7.7 g/dL (ref 6.0–8.3)

## 2013-09-15 LAB — CBC WITH DIFFERENTIAL/PLATELET
Basophils Absolute: 0 10*3/uL (ref 0.0–0.1)
Basophils Relative: 0 % (ref 0–1)
Eosinophils Absolute: 0 10*3/uL (ref 0.0–0.7)
Eosinophils Relative: 0 % (ref 0–5)
HCT: 38.3 % (ref 36.0–46.0)
Hemoglobin: 12.5 g/dL (ref 12.0–15.0)
Lymphocytes Relative: 10 % — ABNORMAL LOW (ref 12–46)
Lymphs Abs: 1.1 10*3/uL (ref 0.7–4.0)
MCH: 30 pg (ref 26.0–34.0)
MCHC: 32.6 g/dL (ref 30.0–36.0)
MCV: 91.8 fL (ref 78.0–100.0)
Monocytes Absolute: 0.8 10*3/uL (ref 0.1–1.0)
Monocytes Relative: 7 % (ref 3–12)
Neutro Abs: 8.6 10*3/uL — ABNORMAL HIGH (ref 1.7–7.7)
Neutrophils Relative %: 83 % — ABNORMAL HIGH (ref 43–77)
Platelets: 255 10*3/uL (ref 150–400)
RBC: 4.17 MIL/uL (ref 3.87–5.11)
RDW: 13.9 % (ref 11.5–15.5)
WBC: 10.5 10*3/uL (ref 4.0–10.5)

## 2013-09-15 LAB — URINALYSIS, ROUTINE W REFLEX MICROSCOPIC
Bilirubin Urine: NEGATIVE
Glucose, UA: NEGATIVE mg/dL
Hgb urine dipstick: NEGATIVE
Ketones, ur: 40 mg/dL — AB
Leukocytes, UA: NEGATIVE
Nitrite: POSITIVE — AB
Protein, ur: 100 mg/dL — AB
Specific Gravity, Urine: 1.021 (ref 1.005–1.030)
Urobilinogen, UA: 1 mg/dL (ref 0.0–1.0)
pH: 8.5 — ABNORMAL HIGH (ref 5.0–8.0)

## 2013-09-15 LAB — I-STAT TROPONIN, ED: Troponin i, poc: 0 ng/mL (ref 0.00–0.08)

## 2013-09-15 LAB — LIPASE, BLOOD: Lipase: 9 U/L — ABNORMAL LOW (ref 11–59)

## 2013-09-15 MED ORDER — LORAZEPAM 2 MG/ML IJ SOLN
1.0000 mg | Freq: Once | INTRAMUSCULAR | Status: AC
  Administered 2013-09-15: 1 mg via INTRAVENOUS
  Filled 2013-09-15: qty 1

## 2013-09-15 MED ORDER — MOMETASONE FURO-FORMOTEROL FUM 100-5 MCG/ACT IN AERO
2.0000 | INHALATION_SPRAY | Freq: Two times a day (BID) | RESPIRATORY_TRACT | Status: DC
  Administered 2013-09-15 – 2013-09-21 (×10): 2 via RESPIRATORY_TRACT
  Filled 2013-09-15 (×2): qty 8.8

## 2013-09-15 MED ORDER — MOMETASONE FURO-FORMOTEROL FUM 100-5 MCG/ACT IN AERO
2.0000 | INHALATION_SPRAY | Freq: Two times a day (BID) | RESPIRATORY_TRACT | Status: DC | PRN
  Filled 2013-09-15: qty 8.8

## 2013-09-15 MED ORDER — ONDANSETRON 4 MG PO TBDP
4.0000 mg | ORAL_TABLET | Freq: Once | ORAL | Status: AC
  Administered 2013-09-15: 4 mg via ORAL
  Filled 2013-09-15: qty 1

## 2013-09-15 MED ORDER — HYDROMORPHONE HCL PF 1 MG/ML IJ SOLN
1.0000 mg | INTRAMUSCULAR | Status: DC | PRN
  Administered 2013-09-15 – 2013-09-16 (×6): 1 mg via INTRAVENOUS
  Filled 2013-09-15 (×6): qty 1

## 2013-09-15 MED ORDER — ACETAMINOPHEN 325 MG PO TABS
650.0000 mg | ORAL_TABLET | Freq: Four times a day (QID) | ORAL | Status: DC | PRN
  Administered 2013-09-17 – 2013-09-18 (×2): 650 mg via ORAL
  Filled 2013-09-15 (×2): qty 2

## 2013-09-15 MED ORDER — CIPROFLOXACIN IN D5W 400 MG/200ML IV SOLN
400.0000 mg | Freq: Once | INTRAVENOUS | Status: AC
  Administered 2013-09-15: 400 mg via INTRAVENOUS
  Filled 2013-09-15: qty 200

## 2013-09-15 MED ORDER — HYDROMORPHONE HCL PF 1 MG/ML IJ SOLN
INTRAMUSCULAR | Status: AC
  Filled 2013-09-15: qty 1

## 2013-09-15 MED ORDER — METOPROLOL TARTRATE 100 MG PO TABS
100.0000 mg | ORAL_TABLET | Freq: Two times a day (BID) | ORAL | Status: DC
  Administered 2013-09-15 – 2013-09-16 (×3): 100 mg via ORAL
  Filled 2013-09-15 (×6): qty 1

## 2013-09-15 MED ORDER — FLUTICASONE PROPIONATE HFA 44 MCG/ACT IN AERO
1.0000 | INHALATION_SPRAY | Freq: Two times a day (BID) | RESPIRATORY_TRACT | Status: DC
  Administered 2013-09-15 – 2013-09-21 (×10): 1 via RESPIRATORY_TRACT
  Filled 2013-09-15 (×2): qty 10.6

## 2013-09-15 MED ORDER — ONDANSETRON HCL 4 MG PO TABS: 4.0000 mg | ORAL_TABLET | Freq: Four times a day (QID) | ORAL | Status: DC | PRN

## 2013-09-15 MED ORDER — LORATADINE 10 MG PO TABS
10.0000 mg | ORAL_TABLET | Freq: Every day | ORAL | Status: DC
  Administered 2013-09-15 – 2013-09-21 (×5): 10 mg via ORAL
  Filled 2013-09-15 (×7): qty 1

## 2013-09-15 MED ORDER — METHOCARBAMOL 500 MG PO TABS
500.0000 mg | ORAL_TABLET | Freq: Four times a day (QID) | ORAL | Status: DC | PRN
  Administered 2013-09-16: 500 mg via ORAL
  Administered 2013-09-19: 1000 mg via ORAL
  Filled 2013-09-15 (×2): qty 2
  Filled 2013-09-15: qty 1

## 2013-09-15 MED ORDER — TRIAMTERENE-HCTZ 37.5-25 MG PO CAPS
1.0000 | ORAL_CAPSULE | Freq: Every day | ORAL | Status: DC
  Administered 2013-09-15 – 2013-09-17 (×3): 1 via ORAL
  Filled 2013-09-15 (×5): qty 1

## 2013-09-15 MED ORDER — ALBUTEROL SULFATE (2.5 MG/3ML) 0.083% IN NEBU: 3.0000 mL | INHALATION_SOLUTION | Freq: Four times a day (QID) | RESPIRATORY_TRACT | Status: DC | PRN

## 2013-09-15 MED ORDER — ACETAMINOPHEN 650 MG RE SUPP: 650.0000 mg | Freq: Four times a day (QID) | RECTAL | Status: DC | PRN

## 2013-09-15 MED ORDER — SIMVASTATIN 20 MG PO TABS
20.0000 mg | ORAL_TABLET | Freq: Every day | ORAL | Status: DC
  Administered 2013-09-15 – 2013-09-19 (×3): 20 mg via ORAL
  Filled 2013-09-15 (×6): qty 1

## 2013-09-15 MED ORDER — IOHEXOL 300 MG/ML  SOLN
25.0000 mL | Freq: Once | INTRAMUSCULAR | Status: AC | PRN
  Administered 2013-09-15: 25 mL via ORAL

## 2013-09-15 MED ORDER — ASPIRIN EC 81 MG PO TBEC
81.0000 mg | DELAYED_RELEASE_TABLET | Freq: Every day | ORAL | Status: DC
  Administered 2013-09-15 – 2013-09-21 (×5): 81 mg via ORAL
  Filled 2013-09-15 (×7): qty 1

## 2013-09-15 MED ORDER — ONDANSETRON HCL 4 MG/2ML IJ SOLN: 4.0000 mg | Freq: Once | INTRAMUSCULAR | Status: DC

## 2013-09-15 MED ORDER — MORPHINE SULFATE 4 MG/ML IJ SOLN: 4.0000 mg | Freq: Once | INTRAMUSCULAR | Status: DC

## 2013-09-15 MED ORDER — IOHEXOL 300 MG/ML  SOLN
100.0000 mL | Freq: Once | INTRAMUSCULAR | Status: AC | PRN
  Administered 2013-09-15: 100 mL via INTRAVENOUS

## 2013-09-15 MED ORDER — SODIUM CHLORIDE 0.9 % IV BOLUS (SEPSIS)
500.0000 mL | Freq: Once | INTRAVENOUS | Status: AC
  Administered 2013-09-15: 500 mL via INTRAVENOUS

## 2013-09-15 MED ORDER — PANTOPRAZOLE SODIUM 40 MG PO TBEC
80.0000 mg | DELAYED_RELEASE_TABLET | Freq: Two times a day (BID) | ORAL | Status: DC
  Administered 2013-09-15 – 2013-09-21 (×10): 80 mg via ORAL
  Filled 2013-09-15 (×11): qty 2

## 2013-09-15 MED ORDER — TIOTROPIUM BROMIDE MONOHYDRATE 18 MCG IN CAPS
18.0000 ug | ORAL_CAPSULE | Freq: Every day | RESPIRATORY_TRACT | Status: DC
  Administered 2013-09-16 – 2013-09-21 (×5): 18 ug via RESPIRATORY_TRACT
  Filled 2013-09-15 (×2): qty 5

## 2013-09-15 MED ORDER — DOCUSATE SODIUM 100 MG PO CAPS
100.0000 mg | ORAL_CAPSULE | Freq: Two times a day (BID) | ORAL | Status: DC
  Administered 2013-09-15 – 2013-09-16 (×2): 100 mg via ORAL
  Filled 2013-09-15 (×2): qty 1

## 2013-09-15 MED ORDER — HYDROMORPHONE HCL PF 1 MG/ML IJ SOLN
1.0000 mg | INTRAMUSCULAR | Status: DC | PRN
  Administered 2013-09-15: 1 mg via INTRAVENOUS

## 2013-09-15 MED ORDER — ALPRAZOLAM 0.5 MG PO TABS
0.5000 mg | ORAL_TABLET | Freq: Three times a day (TID) | ORAL | Status: DC | PRN
  Administered 2013-09-17 – 2013-09-18 (×2): 0.5 mg via ORAL
  Filled 2013-09-15 (×2): qty 1

## 2013-09-15 MED ORDER — EZETIMIBE 10 MG PO TABS
10.0000 mg | ORAL_TABLET | Freq: Every day | ORAL | Status: DC
  Administered 2013-09-15 – 2013-09-21 (×5): 10 mg via ORAL
  Filled 2013-09-15 (×7): qty 1

## 2013-09-15 MED ORDER — ENOXAPARIN SODIUM 40 MG/0.4ML ~~LOC~~ SOLN
40.0000 mg | SUBCUTANEOUS | Status: DC
  Administered 2013-09-15 – 2013-09-20 (×5): 40 mg via SUBCUTANEOUS
  Filled 2013-09-15 (×9): qty 0.4

## 2013-09-15 MED ORDER — HYDROMORPHONE HCL PF 1 MG/ML IJ SOLN
1.0000 mg | Freq: Once | INTRAMUSCULAR | Status: AC
  Administered 2013-09-15: 1 mg via INTRAVENOUS
  Filled 2013-09-15: qty 1

## 2013-09-15 MED ORDER — CIPROFLOXACIN IN D5W 400 MG/200ML IV SOLN
400.0000 mg | Freq: Two times a day (BID) | INTRAVENOUS | Status: DC
  Administered 2013-09-15 – 2013-09-19 (×9): 400 mg via INTRAVENOUS
  Filled 2013-09-15 (×11): qty 200

## 2013-09-15 MED ORDER — MORPHINE SULFATE 4 MG/ML IJ SOLN
4.0000 mg | Freq: Once | INTRAMUSCULAR | Status: AC
  Administered 2013-09-15: 4 mg via INTRAMUSCULAR
  Filled 2013-09-15: qty 1

## 2013-09-15 MED ORDER — ONDANSETRON HCL 4 MG/2ML IJ SOLN: 4.0000 mg | Freq: Four times a day (QID) | INTRAMUSCULAR | Status: DC | PRN

## 2013-09-15 NOTE — ED Provider Notes (Signed)
Medical screening examination/treatment/procedure(s) were performed by non-physician practitioner and as supervising physician I was immediately available for consultation/collaboration.   EKG Interpretation   Date/Time:  Sunday September 15 2013 11:20:10 EDT Ventricular Rate:  70 PR Interval:  115 QRS Duration: 85 QT Interval:  410 QTC Calculation: 442 R Axis:   58 Text Interpretation:  Sinus rhythm Borderline short PR interval Borderline  repolarization abnormality Confirmed by Beau Fanny  MD, Vernie Piet ([id number]) on  09/15/2013 12:07:18 PM       Veryl Speak, MD 09/15/13 1842

## 2013-09-15 NOTE — ED Notes (Signed)
PT returned from scans. Pt monitored by 12-lead, bp cuff, and pulse ox.

## 2013-09-15 NOTE — ED Notes (Signed)
Surgery MD at bedside.

## 2013-09-15 NOTE — ED Notes (Signed)
IV team paged for IV, EMS attempted x 2, nurse attempted x 1.

## 2013-09-15 NOTE — ED Provider Notes (Addendum)
CSN: [id number]     Arrival date & time 09/15/13  1115 History   First MD Initiated Contact with Patient 09/15/13 1115     Chief Complaint  Patient presents with  . Abdominal Pain     (Consider location/radiation/quality/duration/timing/severity/associated sxs/prior Treatment) HPI Comments: States that the pain was intermittent at first and now it is persistent. No drinking. No cp or sob  Patient is a 63 y.o. female presenting with abdominal pain. The history is provided by the patient. No language interpreter was used.  Abdominal Pain Pain location:  Epigastric Pain quality: aching   Pain radiates to:  Back Pain severity:  Moderate Onset quality:  Gradual Duration:  1 week Timing:  Constant Progression:  Worsening Context: retching   Relieved by:  Nothing Worsened by:  Nothing tried Associated symptoms: diarrhea and nausea   Associated symptoms: no fever     Past Medical History  Diagnosis Date  . Glucose intolerance (impaired glucose tolerance)   . Hypertension   . PAD (peripheral artery disease)   . COPD (chronic obstructive pulmonary disease)   . Peripheral neuropathy   . Chronic back pain   . Chronic leg pain   . CAD (coronary artery disease), native coronary artery     Hx CABG 1998, last PCI 2013; LHC 07/2013:  EF 55-60%, L-LAD prob atretic, no sig disease in LAD, S-OM1 ok with patent stent, S-dRCA ok => med Rx.  Marland Kitchen HLD (hyperlipidemia)   . Myocardial infarction   . Sleep apnea   . Shortness of breath     when walking  . Anxiety   . GERD (gastroesophageal reflux disease)    Past Surgical History  Procedure Laterality Date  . Cardiac surgery  1998    LTA-LAD, SVG-OM, SVG-RCA  . Coronary angioplasty with stent placement  11/2011, 02/2012    DES to SVG-RCA both times, In [place], [place], Dr. Bruce Donath  . Cervical spine surgery  1991 and again several years later  . Knee arthroscopy Right 1998  . Finger surgery Right     ring finger  . Orif humerus fracture Left  08/27/2013    Procedure: OPEN REDUCTION INTERNAL FIXATION (ORIF) LEFT HUMERUS ;  Surgeon: Rozanna Box, MD;  Location: [facility];  Service: Orthopedics;  Laterality: Left;   History reviewed. No pertinent family history. History  Substance Use Topics  . Smoking status: Former Smoker    Quit date: 07/20/1998  . Smokeless tobacco: Never Used  . Alcohol Use: No   OB History   Grav Para Term Preterm Abortions TAB SAB Ect Mult Living                 Review of Systems  Constitutional: Negative for fever.  Respiratory: Negative.   Cardiovascular: Negative.   Gastrointestinal: Positive for nausea, abdominal pain and diarrhea.      Allergies  Benadryl; Adhesive; Amoxicillin; and Flexeril  Home Medications   Prior to Admission medications   Medication Sig Start Date End Date Taking? Authorizing Provider  Aclidinium Bromide (TUDORZA PRESSAIR) 400 MCG/ACT AEPB Inhale 1 puff into the lungs daily.    Historical Provider, MD  albuterol (PROVENTIL HFA;VENTOLIN HFA) 108 (90 BASE) MCG/ACT inhaler Inhale 2 puffs into the lungs every 6 (six) hours as needed for wheezing or shortness of breath.    Historical Provider, MD  ALPRAZolam Duanne Moron) 0.5 MG tablet Take 0.5 mg by mouth 3 (three) times daily as needed for anxiety.    Historical Provider, MD  aspirin EC 81  MG tablet Take 81 mg by mouth daily.    Historical Provider, MD  beclomethasone (QVAR) 80 MCG/ACT inhaler Inhale 2 puffs into the lungs 2 (two) times daily.    Historical Provider, MD  cetirizine (ZYRTEC) 10 MG tablet Take 10 mg by mouth daily.    Historical Provider, MD  clopidogrel (PLAVIX) 75 MG tablet Take 75 mg by mouth daily with breakfast.    Historical Provider, MD  Cyanocobalamin 1000 MCG/ML KIT Inject 1,000 mcg as directed every 30 (thirty) days. 15th of each month    Historical Provider, MD  docusate sodium 100 MG CAPS Take 100 mg by mouth 2 (two) times daily. 08/28/13   Jari Pigg, PA-C  ezetimibe (ZETIA) 10 MG tablet Take 10 mg  by mouth daily.    Historical Provider, MD  Fish Oil-Cholecalciferol (FISH OIL + D3 PO) Take 1 capsule by mouth daily.    Historical Provider, MD  Fluticasone-Salmeterol (ADVAIR) 250-50 MCG/DOSE AEPB Inhale 1 puff into the lungs 2 (two) times daily.    Historical Provider, MD  methocarbamol (ROBAXIN) 500 MG tablet Take 1-2 tablets (500-1,000 mg total) by mouth every 6 (six) hours as needed for muscle spasms. 08/28/13   Jari Pigg, PA-C  metoprolol (LOPRESSOR) 100 MG tablet Take 100 mg by mouth 2 (two) times daily.    Historical Provider, MD  mometasone-formoterol (DULERA) 100-5 MCG/ACT AERO Inhale 2 puffs into the lungs 2 (two) times daily as needed for wheezing.    Historical Provider, MD  nitroGLYCERIN (NITROSTAT) 0.4 MG SL tablet Place 0.4 mg under the tongue every 5 (five) minutes as needed for chest pain.    Historical Provider, MD  omeprazole (PRILOSEC) 40 MG capsule Take 40 mg by mouth 2 (two) times daily.    Historical Provider, MD  oxyCODONE (OXY IR/ROXICODONE) 5 MG immediate release tablet Take 1-2 tablets (5-10 mg total) by mouth every 3 (three) hours as needed for breakthrough pain. 08/28/13   Jari Pigg, PA-C  oxyCODONE-acetaminophen (PERCOCET/ROXICET) 5-325 MG per tablet Take 1-2 tablets by mouth every 6 (six) hours as needed for severe pain. 08/28/13   Jari Pigg, PA-C  simvastatin (ZOCOR) 20 MG tablet Take 20 mg by mouth daily.    Historical Provider, MD  triamterene-hydrochlorothiazide (DYAZIDE) 37.5-25 MG per capsule Take 1 capsule by mouth daily.    Historical Provider, MD   BP 143/67  Pulse 68  Temp(Src) 98.1 F (36.7 C)  Resp 22  Ht 5' 2"  (1.575 m)  Wt 195 lb (88.451 kg)  BMI 35.66 kg/m2  SpO2 100% Physical Exam  Nursing note and vitals reviewed. Constitutional: She is oriented to person, place, and time. She appears well-developed and well-nourished.  Cardiovascular: Normal rate and regular rhythm.   Pulmonary/Chest: Effort normal and breath sounds normal.   Abdominal: Soft. Bowel sounds are normal. There is tenderness in the right upper quadrant, epigastric area and left upper quadrant.  Musculoskeletal: Normal range of motion.  Neurological: She is alert and oriented to person, place, and time.  Skin: Skin is warm and dry.  Psychiatric: She has a normal mood and affect.    ED Course  Procedures (including critical care time) Labs Review Labs Reviewed  CBC WITH DIFFERENTIAL - Abnormal; Notable for the following:    Neutrophils Relative % 83 (*)    Neutro Abs 8.6 (*)    Lymphocytes Relative 10 (*)    All other components within normal limits  COMPREHENSIVE METABOLIC PANEL - Abnormal; Notable for the following:  Potassium 3.5 (*)    Glucose, Bld 111 (*)    Anion gap 21 (*)    All other components within normal limits  LIPASE, BLOOD - Abnormal; Notable for the following:    Lipase 9 (*)    All other components within normal limits  URINALYSIS, ROUTINE W REFLEX MICROSCOPIC - Abnormal; Notable for the following:    APPearance HAZY (*)    pH 8.5 (*)    Ketones, ur 40 (*)    Protein, ur 100 (*)    Nitrite POSITIVE (*)    All other components within normal limits  URINE MICROSCOPIC-ADD ON - Abnormal; Notable for the following:    Squamous Epithelial / LPF FEW (*)    Bacteria, UA MANY (*)    All other components within normal limits  I-STAT TROPOININ, ED    Imaging Review US Abdomen Complete  09/15/2013   CLINICAL DATA:  Abdominal pain  EXAM: ULTRASOUND ABDOMEN COMPLETE  COMPARISON:  CT abdomen and pelvis September 15, 2013  FINDINGS: Gallbladder:  Sludge is seen within the gallbladder. There are no echogenic foci which move and shadowing consistent with gallstones. The gallbladder wall is thickened at 5 mm in thickness. There is no gallbladder wall edema or pericholecystic fluid. No sonographic Murphy sign noted.  Common bile duct:  Diameter: 6 mm. There is no intrahepatic, common hepatic, common bile duct dilatation.  Liver:  No focal  lesion identified. Liver echogenicity is diffusely increased.  IVC:  No abnormality visualized.  Pancreas:  Visualized portion unremarkable. Portions of the pancreas were obscured by gas.  Spleen:  Size and appearance within normal limits.  Right Kidney:  Length: 10.2 cm. Echogenicity within normal limits. No mass or hydronephrosis visualized.  Left Kidney:  Length: 10.8 cm. Echogenicity within normal limits. No mass or hydronephrosis visualized. There is scarring in the lower pole region  Abdominal aorta:  No aneurysm visualized.  Portions of the aorta are obscured by gas.  Other findings:  No demonstrable ascites.  IMPRESSION: Gallbladder wall is thickened. There is no gallbladder wall edema or pericholecystic fluid, however. There is sludge in the gallbladder but no appreciable gallstones. The wall thickening could indicate a degree of acalculus cholecystitis.  Portions of the pancreas and abdominal aorta are obscured by gas. Visualized portions of these structures appear normal.  Increased echogenicity liver consistent with fatty change. While no focal liver lesions are identified, it must be cautioned that the sensitivity ofultrasound for focal liver lesions is diminished in this circumstance.   Electronically Signed   By: Lowella Grip M.D.   On: 09/15/2013 17:13   Ct Abdomen Pelvis W Contrast  09/15/2013   CLINICAL DATA:  Intermittent mid and upper abdominal pain beginning 1 week ago.  EXAM: CT ABDOMEN AND PELVIS WITH CONTRAST  TECHNIQUE: Multidetector CT imaging of the abdomen and pelvis was performed using the standard protocol following bolus administration of intravenous contrast.  CONTRAST:  100 mL OMNIPAQUE IOHEXOL 300 MG/ML  SOLN  COMPARISON:  Aortic ultrasound 06/05/2013.  FINDINGS: Mild dependent atelectasis is seen in the lung bases. No pleural or pericardial effusion.  The liver is diffusely low attenuating consistent with fatty infiltration. No focal liver lesion is seen. The gallbladder is  mildly dilated and there is some stranding about the gallbladder. The pancreas, adrenal glands, spleen and kidneys appear normal.  Fem-fem bypass graft is identified and opacifies with contrast material. There is aortoiliac atherosclerosis without aneurysm. 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  Left common iliac artery also may be occluded with reconstitution of the internal and external iliacs.  The stomach, small bowel and appendix appear normal. No lymphadenopathy is identified.  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 No femoral head fragmentation or collapse is identified. Lumbar spondylosis is noted.  IMPRESSION: Dilated gallbladder with pericholecystic stranding worrisome for cholecystitis. Right upper quadrant ultrasound would be useful further evaluation.  Diffuse fatty infiltration of the liver.  Extensive vascular disease as detailed above. Fem-fem bypass graft appears patent.  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   Electronically Signed   By: Inge Rise M.D.   On: 09/15/2013 15:16     EKG Interpretation   Date/Time:  Sunday September 15 2013 11:20:10 EDT Ventricular Rate:  70 PR Interval:  115 QRS Duration: 85 QT Interval:  410 QTC Calculation: 442 R Axis:   58 Text Interpretation:  Sinus rhythm Borderline short PR interval Borderline  repolarization abnormality Confirmed by DELOS  MD, DOUGLAS ([id number]) on  09/15/2013 12:07:18 PM      MDM   Final diagnoses:  UTI (lower urinary tract infection)  Cholecystitis   Pt pain is recurring. DR. Grandville Silos with surgery to come evaluate the pt Dr. Grandville Silos evaluated pt is to be a hospital admission. Pt plavix to be held per Dr. Remi Haggard, NP 09/15/13 1814  Glendell Docker, NP 09/15/13 2030

## 2013-09-15 NOTE — H&P (Addendum)
Patient's PCP: Daphene Jaeger, PA-C  Chief Complaint: Abdominal pain  History of Present Illness: Wendy [name] is a 63 y.o. Caucasian female with history of impaired glucose intolerance, hypertension, peripheral artery disease, COPD, coronary artery disease, hyperlipidemia, GERD, anxiety, and chronic back pain who presents with the above complaints.  Patient reports that her symptoms started on 09/09/2013 when she noted abdominal pain, since then her symptoms have improved.  However on 09/13/2013 she had recurrence of her abdominal pain which was in the epigastric region and the pain was worse than it has been in the past.  This pain was constant in nature.  Only pain medications would improve the pain.  She has not been able to eat or tolerate any oral intake.  Due to ongoing symptoms she presented to the emergency department for further evaluation.  CT of abdomen and pelvis was suggestive of cholecystitis an abdominal ultrasound showed gallbladder wall was thickened, sludge was noted but no gallstones.  Patient's liver function tests were normal.  Patient was evaluated by general surgery, who are planning on laparoscopic cholecystectomy in the next 24-48 hours.  However given patient's multiple medical comorbidities hospitalist service was asked to admit the patient for further care and management.  Patient denies any recent fevers, chest pain, diarrhea, headaches or vision changes.  She does admit to having nausea and vomiting at home, denies any blood in her emesis.  Admits to having shortness of breath with deep inspiration due to abdominal pain.  Review of Systems: All systems reviewed with the patient and positive as per history of present illness, otherwise all other systems are negative.  Past Medical History  Diagnosis Date  . Glucose intolerance (impaired glucose tolerance)   . Hypertension   . PAD (peripheral artery disease)   . COPD (chronic obstructive pulmonary disease)   .  Peripheral neuropathy   . Chronic back pain   . Chronic leg pain   . CAD (coronary artery disease), native coronary artery     Hx CABG 1998, last PCI 2013; LHC 07/2013:  EF 55-60%, L-LAD prob atretic, no sig disease in LAD, S-OM1 ok with patent stent, S-dRCA ok => med Rx.  Marland Kitchen HLD (hyperlipidemia)   . Myocardial infarction   . Sleep apnea   . Shortness of breath     when walking  . Anxiety   . GERD (gastroesophageal reflux disease)    Past Surgical History  Procedure Laterality Date  . Cardiac surgery  1998    LTA-LAD, SVG-OM, SVG-RCA  . Coronary angioplasty with stent placement  11/2011, 02/2012    DES to SVG-RCA both times, In [place], [place], Dr. Bruce Donath  . Cervical spine surgery  1991 and again several years later  . Knee arthroscopy Right 1998  . Finger surgery Right     ring finger  . Orif humerus fracture Left 08/27/2013    Procedure: OPEN REDUCTION INTERNAL FIXATION (ORIF) LEFT HUMERUS ;  Surgeon: Rozanna Box, MD;  Location: [place];  Service: Orthopedics;  Laterality: Left;   Family History  Problem Relation Age of Onset  . Lung cancer Mother    History   Social History  . Marital Status: Legally Separated    Spouse Name: N/A    Number of Children: N/A  . Years of Education: N/A   Occupational History  . Disabled    Social History Main Topics  . Smoking status: Former Smoker    Quit date: 07/20/1998  . Smokeless tobacco: Never Used  .  Alcohol Use: No  . Drug Use: No  . Sexual Activity: Not on file   Other Topics Concern  . Not on file   Social History Narrative   Lives with best friend.    Allergies: Benadryl; Adhesive; Amoxicillin; and Flexeril  Home Meds: Prior to Admission medications   Medication Sig Start Date End Date Taking? Authorizing Provider  Aclidinium Bromide (TUDORZA PRESSAIR) 400 MCG/ACT AEPB Inhale 1 puff into the lungs daily.   Yes Historical Provider, MD  albuterol (PROVENTIL HFA;VENTOLIN HFA) 108 (90 BASE) MCG/ACT inhaler Inhale 2  puffs into the lungs every 6 (six) hours as needed for wheezing or shortness of breath.   Yes Historical Provider, MD  ALPRAZolam Duanne Moron) 0.5 MG tablet Take 0.5 mg by mouth 3 (three) times daily as needed for anxiety.   Yes Historical Provider, MD  aspirin EC 81 MG tablet Take 81 mg by mouth daily.   Yes Historical Provider, MD  beclomethasone (QVAR) 80 MCG/ACT inhaler Inhale 2 puffs into the lungs 2 (two) times daily.   Yes Historical Provider, MD  cetirizine (ZYRTEC) 10 MG tablet Take 10 mg by mouth daily.   Yes Historical Provider, MD  clopidogrel (PLAVIX) 75 MG tablet Take 75 mg by mouth daily with breakfast.   Yes Historical Provider, MD  Cyanocobalamin 1000 MCG/ML KIT Inject 1,000 mcg as directed every 30 (thirty) days. 15th of each month   Yes Historical Provider, MD  docusate sodium 100 MG CAPS Take 100 mg by mouth 2 (two) times daily. 08/28/13  Yes Jari Pigg, PA-C  ezetimibe (ZETIA) 10 MG tablet Take 10 mg by mouth daily.   Yes Historical Provider, MD  Fish Oil-Cholecalciferol (FISH OIL + D3 PO) Take 1 capsule by mouth daily.   Yes Historical Provider, MD  Fluticasone-Salmeterol (ADVAIR) 250-50 MCG/DOSE AEPB Inhale 1 puff into the lungs 2 (two) times daily.   Yes Historical Provider, MD  methocarbamol (ROBAXIN) 500 MG tablet Take 1-2 tablets (500-1,000 mg total) by mouth every 6 (six) hours as needed for muscle spasms. 08/28/13  Yes Jari Pigg, PA-C  metoprolol (LOPRESSOR) 100 MG tablet Take 100 mg by mouth 2 (two) times daily.   Yes Historical Provider, MD  mometasone-formoterol (DULERA) 100-5 MCG/ACT AERO Inhale 2 puffs into the lungs 2 (two) times daily as needed for wheezing.   Yes Historical Provider, MD  nitroGLYCERIN (NITROSTAT) 0.4 MG SL tablet Place 0.4 mg under the tongue every 5 (five) minutes as needed for chest pain.   Yes Historical Provider, MD  omeprazole (PRILOSEC) 40 MG capsule Take 40 mg by mouth 2 (two) times daily.   Yes Historical Provider, MD  oxyCODONE (OXY  IR/ROXICODONE) 5 MG immediate release tablet Take 1-2 tablets (5-10 mg total) by mouth every 3 (three) hours as needed for breakthrough pain. 08/28/13  Yes Jari Pigg, PA-C  oxyCODONE-acetaminophen (PERCOCET/ROXICET) 5-325 MG per tablet Take 1-2 tablets by mouth every 6 (six) hours as needed for severe pain. 08/28/13  Yes Jari Pigg, PA-C  simvastatin (ZOCOR) 20 MG tablet Take 20 mg by mouth daily.   Yes Historical Provider, MD  triamterene-hydrochlorothiazide (DYAZIDE) 37.5-25 MG per capsule Take 1 capsule by mouth daily.   Yes Historical Provider, MD    Physical Exam: Blood pressure 170/66, pulse 88, temperature 98.1 F (36.7 C), resp. rate 23, height 5' 2"  (1.575 m), weight 88.451 kg (195 lb), SpO2 91.00%. General: Awake, Oriented x3, in some distress from abdominal pain, moaning at times. HEENT: EOMI, Moist mucous  membranes Neck: Supple CV: S1 and S2 Lungs: Clear to ascultation bilaterally Abdomen: Soft, tender in the epigastric region with palpation, no guarding, Nondistended, +bowel sounds. Ext: Good pulses. Trace edema. No clubbing or cyanosis noted.  Recent surgical scar on left humerus well healing no signs of infection. Neuro: Cranial Nerves II-XII grossly intact. Has 5/5 motor strength in upper and lower extremities.  Lab results:  Recent Labs  09/15/13 1302  NA 143  K 3.5*  CL 99  CO2 23  GLUCOSE 111*  BUN 16  CREATININE 0.69  CALCIUM 9.6    Recent Labs  09/15/13 1302  AST 18  ALT 22  ALKPHOS 98  BILITOT 0.7  PROT 7.7  ALBUMIN 3.5    Recent Labs  09/15/13 1302  LIPASE 9*    Recent Labs  09/15/13 1302  WBC 10.5  NEUTROABS 8.6*  HGB 12.5  HCT 38.3  MCV 91.8  PLT 255   No results found for this basename: CKTOTAL, CKMB, CKMBINDEX, TROPONINI,  in the last 72 hours No components found with this basename: POCBNP,  No results found for this basename: DDIMER,  in the last 72 hours No results found for this basename: HGBA1C,  in the last 72 hours No  results found for this basename: CHOL, HDL, LDLCALC, TRIG, CHOLHDL, LDLDIRECT,  in the last 72 hours No results found for this basename: TSH, T4TOTAL, FREET3, T3FREE, THYROIDAB,  in the last 72 hours No results found for this basename: VITAMINB12, FOLATE, FERRITIN, TIBC, IRON, RETICCTPCT,  in the last 72 hours Imaging results:  Dg Chest 2 View  08/26/2013   CLINICAL DATA:  Preoperative chest x-ray prior to left humeral repair  EXAM: CHEST  2 VIEW  COMPARISON:  Prior chest x-ray 07/19/2013  FINDINGS: Cardiac and mediastinal contours remain within normal limits. Patient is status post median sternotomy with evidence of multivessel CABG including LIMA bypass. Metallic stents project over the right and left anterior descending coronary arteries. The lungs are clear. No pleural effusion or pneumothorax. Incompletely imaged left mid humeral fracture. No acute osseous abnormality.  IMPRESSION: No active cardiopulmonary disease.  Evidence of prior percutaneous coronary artery stenting and multivessel CABG suggests a significant past history of coronary artery disease.   Electronically Signed   By: Jacqulynn Cadet M.D.   On: 08/26/2013 14:14   US Abdomen Complete  09/15/2013   CLINICAL DATA:  Abdominal pain  EXAM: ULTRASOUND ABDOMEN COMPLETE  COMPARISON:  CT abdomen and pelvis September 15, 2013  FINDINGS: Gallbladder:  Sludge is seen within the gallbladder. There are no echogenic foci which move and shadowing consistent with gallstones. The gallbladder wall is thickened at 5 mm in thickness. There is no gallbladder wall edema or pericholecystic fluid. No sonographic Murphy sign noted.  Common bile duct:  Diameter: 6 mm. There is no intrahepatic, common hepatic, common bile duct dilatation.  Liver:  No focal lesion identified. Liver echogenicity is diffusely increased.  IVC:  No abnormality visualized.  Pancreas:  Visualized portion unremarkable. Portions of the pancreas were obscured by gas.  Spleen:  Size and  appearance within normal limits.  Right Kidney:  Length: 10.2 cm. Echogenicity within normal limits. No mass or hydronephrosis visualized.  Left Kidney:  Length: 10.8 cm. Echogenicity within normal limits. No mass or hydronephrosis visualized. There is scarring in the lower pole region  Abdominal aorta:  No aneurysm visualized.  Portions of the aorta are obscured by gas.  Other findings:  No demonstrable ascites.  IMPRESSION: Gallbladder wall  is thickened. There is no gallbladder wall edema or pericholecystic fluid, however. There is sludge in the gallbladder but no appreciable gallstones. The wall thickening could indicate a degree of acalculus cholecystitis.  Portions of the pancreas and abdominal aorta are obscured by gas. Visualized portions of these structures appear normal.  Increased echogenicity liver consistent with fatty change. While no focal liver lesions are identified, it must be cautioned that the sensitivity ofultrasound for focal liver lesions is diminished in this circumstance.   Electronically Signed   By: Lowella Grip M.D.   On: 09/15/2013 17:13   Ct Abdomen Pelvis W Contrast  09/15/2013   CLINICAL DATA:  Intermittent mid and upper abdominal pain beginning 1 week ago.  EXAM: CT ABDOMEN AND PELVIS WITH CONTRAST  TECHNIQUE: Multidetector CT imaging of the abdomen and pelvis was performed using the standard protocol following bolus administration of intravenous contrast.  CONTRAST:  100 mL OMNIPAQUE IOHEXOL 300 MG/ML  SOLN  COMPARISON:  Aortic ultrasound 06/05/2013.  FINDINGS: Mild dependent atelectasis is seen in the lung bases. No pleural or pericardial effusion.  The liver is diffusely low attenuating consistent with fatty infiltration. No focal liver lesion is seen. The gallbladder is mildly dilated and there is some stranding about the gallbladder. The pancreas, adrenal glands, spleen and kidneys appear normal.  Fem-fem bypass graft is identified and opacifies with contrast material.  There is aortoiliac atherosclerosis without aneurysm. 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 Left common iliac artery also may be occluded with reconstitution of the internal and external iliacs.  The stomach, small bowel and appendix appear normal. No lymphadenopathy is identified.  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 No femoral head fragmentation or collapse is identified. Lumbar spondylosis is noted.  IMPRESSION: Dilated gallbladder with pericholecystic stranding worrisome for cholecystitis. Right upper quadrant ultrasound would be useful further evaluation.  Diffuse fatty infiltration of the liver.  Extensive vascular disease as detailed above. Fem-fem bypass graft appears patent.  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   Electronically Signed   By: Inge Rise M.D.   On: 09/15/2013 15:16   Dg Humerus Left  08/27/2013   CLINICAL DATA:  Postop  EXAM: LEFT HUMERUS - 2+ VIEW  COMPARISON:  Intraoperative C-arm spot films  FINDINGS: Plate and screw fixation of the comminuted fracture of the mid proximal left humerus is noted. The fixation plate is in good position. No complicating features are seen.  IMPRESSION: Fixation of mid proximal comminuted left humeral fracture.   Electronically Signed   By: Ivar Drape M.D.   On: 08/27/2013 15:27   Dg Humerus Left  08/27/2013   CLINICAL DATA:  Humerus fracture.  EXAM: LEFT HUMERUS - 2+ VIEW  COMPARISON:  None.  FINDINGS: Patient status post open reduction internal fixation of comminuted fracture of the left humerus. Evidence of prior plate and screw fixation noted. This fracture may be old with nonunion. Repeat fracture at site of prior fracture.  IMPRESSION: Open reduction internal fixation left humeral fracture described above.   Electronically Signed   By: Marcello Moores  Register   On: 08/27/2013 12:44   Other results: EKG: Sinus rhythm with  heart rate of 70.  Assessment & Plan by Problem: Acute cholecystitis Patient started on ciprofloxacin which will be continued.  Will have the patient n.p.o.  Appreciate general surgery's input, plan for a cholecystectomy in the next 24-48 hours.  Pain controlled  with Dilaudid.  Coronary artery disease Continue home medications except Plavix, continue aspirin.  Stable.  Hypertension Stable.  Continue home antihypertensive medications.  Hyperlipidemia Continue statin.  COPD Stable at this time.  Continue home inhalers.  Obstructive sleep apnea Continue CPAP at bedtime.  GERD Continue PPI.  Anxiety Benzodiazepines as needed.  Peripheral artery disease Stable.  Patient underwent repair of left humerus nonunion on 08/28/2013 by Dr. Alto Denver.  Urinary tract infection Adequate coverage of ciprofloxacin.  Prophylaxis Lovenox.  CODE STATUS Full code.  Disposition Admit the patient to medical bed as inpatient.  Time spent on admission, talking to the patient, and coordinating care was: 50 mins.  Dex Blakely A, MD 09/15/2013, 8:50 PM

## 2013-09-15 NOTE — ED Notes (Signed)
LAB UNABLE TO OBTAIN BLOOD.

## 2013-09-15 NOTE — Consult Note (Signed)
Reason for Consult:Cholecystitis Referring Physician: Naba Sneed is an 63 y.o. female.  HPI: Patient had episodic right upper quadrant abdominal pain over the last 2 weeks. It returned 2 days ago. It has been persistent since then and was associated with nausea and vomiting. Nothing seemed to make it better or worse. She came to the emergency department for evaluation. CT scan of the abdomen and pelvis was suggestive of cholecystitis. Should not have ultrasound which demonstrates gallbladder wall thickening and sludge. Her pain has only been intermittently relieved in the emergency department and I was asked to see her from a surgery standpoint. She has multiple medical problems and recently had ORIF left humerus by Dr. Marcelino Scot.  Past Medical History  Diagnosis Date  . Glucose intolerance (impaired glucose tolerance)   . Hypertension   . PAD (peripheral artery disease)   . COPD (chronic obstructive pulmonary disease)   . Peripheral neuropathy   . Chronic back pain   . Chronic leg pain   . CAD (coronary artery disease), native coronary artery     Hx CABG 1998, last PCI 2013; LHC 07/2013:  EF 55-60%, L-LAD prob atretic, no sig disease in LAD, S-OM1 ok with patent stent, S-dRCA ok => med Rx.  Marland Kitchen HLD (hyperlipidemia)   . Myocardial infarction   . Sleep apnea   . Shortness of breath     when walking  . Anxiety   . GERD (gastroesophageal reflux disease)     Past Surgical History  Procedure Laterality Date  . Cardiac surgery  1998    LTA-LAD, SVG-OM, SVG-RCA  . Coronary angioplasty with stent placement  11/2011, 02/2012    DES to SVG-RCA both times, In [place], [place], Dr. Bruce Donath  . Cervical spine surgery  1991 and again several years later  . Knee arthroscopy Right 1998  . Finger surgery Right     ring finger  . Orif humerus fracture Left 08/27/2013    Procedure: OPEN REDUCTION INTERNAL FIXATION (ORIF) LEFT HUMERUS ;  Surgeon: Rozanna Box, MD;  Location: [facility];   Service: Orthopedics;  Laterality: Left;    History reviewed. No pertinent family history.  Social History:  reports that she quit smoking about 15 years ago. She has never used smokeless tobacco. She reports that she does not drink alcohol or use illicit drugs.  Allergies:  Allergies  Allergen Reactions  . Benadryl [Diphenhydramine] Shortness Of Breath  . Adhesive [Tape]     Burning of skin  . Amoxicillin Nausea And Vomiting  . Flexeril [Cyclobenzaprine] Other (See Comments)    States that it messes with her blood circulation; pt states that she has to walk around    Medications: Prior to Admission:  (Not in a hospital admission)  Results for orders placed during the hospital encounter of 09/15/13 (from the past 48 hour(s))  CBC WITH DIFFERENTIAL     Status: Abnormal   Collection Time    09/15/13  1:02 PM      Result Value Ref Range   WBC 10.5  4.0 - 10.5 K/uL   RBC 4.17  3.87 - 5.11 MIL/uL   Hemoglobin 12.5  12.0 - 15.0 g/dL   HCT 38.3  36.0 - 46.0 %   MCV 91.8  78.0 - 100.0 fL   MCH 30.0  26.0 - 34.0 pg   MCHC 32.6  30.0 - 36.0 g/dL   RDW 13.9  11.5 - 15.5 %   Platelets 255  150 - 400 K/uL  Neutrophils Relative % 83 (*) 43 - 77 %   Neutro Abs 8.6 (*) 1.7 - 7.7 K/uL   Lymphocytes Relative 10 (*) 12 - 46 %   Lymphs Abs 1.1  0.7 - 4.0 K/uL   Monocytes Relative 7  3 - 12 %   Monocytes Absolute 0.8  0.1 - 1.0 K/uL   Eosinophils Relative 0  0 - 5 %   Eosinophils Absolute 0.0  0.0 - 0.7 K/uL   Basophils Relative 0  0 - 1 %   Basophils Absolute 0.0  0.0 - 0.1 K/uL  COMPREHENSIVE METABOLIC PANEL     Status: Abnormal   Collection Time    09/15/13  1:02 PM      Result Value Ref Range   Sodium 143  137 - 147 mEq/L   Potassium 3.5 (*) 3.7 - 5.3 mEq/L   Chloride 99  96 - 112 mEq/L   CO2 23  19 - 32 mEq/L   Glucose, Bld 111 (*) 70 - 99 mg/dL   BUN 16  6 - 23 mg/dL   Creatinine, Ser 0.69  0.50 - 1.10 mg/dL   Calcium 9.6  8.4 - 10.5 mg/dL   Total Protein 7.7  6.0 - 8.3  g/dL   Albumin 3.5  3.5 - 5.2 g/dL   AST 18  0 - 37 U/L   ALT 22  0 - 35 U/L   Alkaline Phosphatase 98  39 - 117 U/L   Total Bilirubin 0.7  0.3 - 1.2 mg/dL   GFR calc non Af Amer >90  >90 mL/min   GFR calc Af Amer >90  >90 mL/min   Comment: (NOTE)     The eGFR has been calculated using the CKD EPI equation.     This calculation has not been validated in all clinical situations.     eGFR's persistently <90 mL/min signify possible Chronic Kidney     Disease.   Anion gap 21 (*) 5 - 15  LIPASE, BLOOD     Status: Abnormal   Collection Time    09/15/13  1:02 PM      Result Value Ref Range   Lipase 9 (*) 11 - 59 U/L  I-STAT TROPOININ, ED     Status: None   Collection Time    09/15/13  1:18 PM      Result Value Ref Range   Troponin i, poc 0.00  0.00 - 0.08 ng/mL   Comment 3            Comment: Due to the release kinetics of cTnI,     a negative result within the first hours     of the onset of symptoms does not rule out     myocardial infarction with certainty.     If myocardial infarction is still suspected,     repeat the test at appropriate intervals.  URINALYSIS, ROUTINE W REFLEX MICROSCOPIC     Status: Abnormal   Collection Time    09/15/13  1:31 PM      Result Value Ref Range   Color, Urine YELLOW  YELLOW   APPearance HAZY (*) CLEAR   Specific Gravity, Urine 1.021  1.005 - 1.030   pH 8.5 (*) 5.0 - 8.0   Glucose, UA NEGATIVE  NEGATIVE mg/dL   Hgb urine dipstick NEGATIVE  NEGATIVE   Bilirubin Urine NEGATIVE  NEGATIVE   Ketones, ur 40 (*) NEGATIVE mg/dL   Protein, ur 100 (*) NEGATIVE mg/dL   Urobilinogen, UA  1.0  0.0 - 1.0 mg/dL   Nitrite POSITIVE (*) NEGATIVE   Leukocytes, UA NEGATIVE  NEGATIVE  URINE MICROSCOPIC-ADD ON     Status: Abnormal   Collection Time    09/15/13  1:31 PM      Result Value Ref Range   Squamous Epithelial / LPF FEW (*) RARE   WBC, UA 0-2  <3 WBC/hpf   Bacteria, UA MANY (*) RARE    US Abdomen Complete  09/15/2013   CLINICAL DATA:  Abdominal  pain  EXAM: ULTRASOUND ABDOMEN COMPLETE  COMPARISON:  CT abdomen and pelvis September 15, 2013  FINDINGS: Gallbladder:  Sludge is seen within the gallbladder. There are no echogenic foci which move and shadowing consistent with gallstones. The gallbladder wall is thickened at 5 mm in thickness. There is no gallbladder wall edema or pericholecystic fluid. No sonographic Murphy sign noted.  Common bile duct:  Diameter: 6 mm. There is no intrahepatic, common hepatic, common bile duct dilatation.  Liver:  No focal lesion identified. Liver echogenicity is diffusely increased.  IVC:  No abnormality visualized.  Pancreas:  Visualized portion unremarkable. Portions of the pancreas were obscured by gas.  Spleen:  Size and appearance within normal limits.  Right Kidney:  Length: 10.2 cm. Echogenicity within normal limits. No mass or hydronephrosis visualized.  Left Kidney:  Length: 10.8 cm. Echogenicity within normal limits. No mass or hydronephrosis visualized. There is scarring in the lower pole region  Abdominal aorta:  No aneurysm visualized.  Portions of the aorta are obscured by gas.  Other findings:  No demonstrable ascites.  IMPRESSION: Gallbladder wall is thickened. There is no gallbladder wall edema or pericholecystic fluid, however. There is sludge in the gallbladder but no appreciable gallstones. The wall thickening could indicate a degree of acalculus cholecystitis.  Portions of the pancreas and abdominal aorta are obscured by gas. Visualized portions of these structures appear normal.  Increased echogenicity liver consistent with fatty change. While no focal liver lesions are identified, it must be cautioned that the sensitivity ofultrasound for focal liver lesions is diminished in this circumstance.   Electronically Signed   By: Lowella Grip M.D.   On: 09/15/2013 17:13   Ct Abdomen Pelvis W Contrast  09/15/2013   CLINICAL DATA:  Intermittent mid and upper abdominal pain beginning 1 week ago.  EXAM: CT  ABDOMEN AND PELVIS WITH CONTRAST  TECHNIQUE: Multidetector CT imaging of the abdomen and pelvis was performed using the standard protocol following bolus administration of intravenous contrast.  CONTRAST:  100 mL OMNIPAQUE IOHEXOL 300 MG/ML  SOLN  COMPARISON:  Aortic ultrasound 06/05/2013.  FINDINGS: Mild dependent atelectasis is seen in the lung bases. No pleural or pericardial effusion.  The liver is diffusely low attenuating consistent with fatty infiltration. No focal liver lesion is seen. The gallbladder is mildly dilated and there is some stranding about the gallbladder. The pancreas, adrenal glands, spleen and kidneys appear normal.  Fem-fem bypass graft is identified and opacifies with contrast material. There is aortoiliac atherosclerosis without aneurysm. 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 Left common iliac artery also may be occluded with reconstitution of the internal and external iliacs.  The stomach, small bowel and appendix appear normal. No lymphadenopathy is identified.  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 No femoral head fragmentation or collapse is identified. Lumbar spondylosis is noted.  IMPRESSION: Dilated gallbladder with pericholecystic stranding worrisome for cholecystitis. Right upper quadrant ultrasound would be useful  further evaluation.  Diffuse fatty infiltration of the liver.  Extensive vascular disease as detailed above. Fem-fem bypass graft appears patent.  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   Electronically Signed   By: Inge Rise M.D.   On: 09/15/2013 15:16    Review of Systems  Constitutional: Negative for fever and chills.  HENT: Negative.   Eyes: Negative.   Respiratory: Negative.   Cardiovascular: Negative for chest pain and palpitations.  Gastrointestinal: Positive for nausea, vomiting and abdominal pain.  Genitourinary: Negative.    Musculoskeletal:       Recent ORIF L humerus  Skin: Negative.   Neurological: Negative.   Endo/Heme/Allergies: Negative.   Psychiatric/Behavioral: Negative.    Blood pressure 170/66, pulse 88, temperature 98.1 F (36.7 C), resp. rate 23, height 5' 2"  (1.575 m), weight 195 lb (88.451 kg), SpO2 91.00%. Physical Exam  Constitutional: She is oriented to person, place, and time. She appears well-developed and well-nourished. No distress.  HENT:  Head: Normocephalic and atraumatic.  Right Ear: External ear normal.  Left Ear: External ear normal.  Nose: Nose normal.  Eyes: EOM are normal. Pupils are equal, round, and reactive to light. No scleral icterus.  Neck: Normal range of motion. Neck supple. No tracheal deviation present.  Cardiovascular: Normal rate, regular rhythm and normal heart sounds.   Respiratory: Effort normal and breath sounds normal. No stridor. No respiratory distress. She has no wheezes. She has no rales.  GI: Soft. She exhibits no distension. There is tenderness. There is no rebound and no guarding.  Tender right upper quadrant, no mass, no guarding, no generalized tenderness  Musculoskeletal:  Recent incision left arm  Neurological: She is alert and oriented to person, place, and time. She exhibits normal muscle tone.  Skin: Skin is warm.  Psychiatric: She has a normal mood and affect.    Assessment/Plan: Coronary artery disease on Plavix, vascular disease, COPD - Agree with medical admission Cholecystitis - n.p.o., IV Cipro, we'll plan laparoscopic cholecystectomy within the next 24-48 hours.  Please hold Plavix.  Blima Jaimes E 09/15/2013, 8:13 PM

## 2013-09-15 NOTE — ED Notes (Signed)
Pt sitting up in bed moaning with pain, stating she is having a panic attack.   Comfort measures given to patient. NP aware and orders obtained.

## 2013-09-15 NOTE — Progress Notes (Signed)
Rt brought pt a cpap machine but pt was feeling nauseous and rt recommended to wait to wear until it passed.  Machine left in room, and pt knows to call Rn when she is ready to be placed on it ,.  Rt will continue to monitor.

## 2013-09-15 NOTE — ED Notes (Signed)
To room via EMS.  Onset 1 week intermittant mid upper abd pain radiating through to back.  Onset Friday pain is constant.  Vomited x 2 on Friday, dry heaves since.  No nausea at this time.  Denies chest pain, shortness of breath or other s/s noted.

## 2013-09-16 ENCOUNTER — Encounter (HOSPITAL_COMMUNITY): Payer: Self-pay | Admitting: Certified Registered Nurse Anesthetist

## 2013-09-16 ENCOUNTER — Encounter (HOSPITAL_COMMUNITY): Payer: Medicare Other | Admitting: Certified Registered Nurse Anesthetist

## 2013-09-16 ENCOUNTER — Observation Stay (HOSPITAL_COMMUNITY): Payer: Medicare Other

## 2013-09-16 ENCOUNTER — Observation Stay (HOSPITAL_COMMUNITY): Payer: Medicare Other | Admitting: Certified Registered Nurse Anesthetist

## 2013-09-16 ENCOUNTER — Encounter (HOSPITAL_COMMUNITY)
Admission: EM | Disposition: A | Discharge status: Home / Self Care | Payer: Self-pay | Source: Home / Self Care | Attending: Internal Medicine

## 2013-09-16 DIAGNOSIS — Z79899 Other long term (current) drug therapy: Secondary | ICD-10-CM | POA: Diagnosis not present

## 2013-09-16 DIAGNOSIS — Z951 Presence of aortocoronary bypass graft: Secondary | ICD-10-CM | POA: Diagnosis not present

## 2013-09-16 DIAGNOSIS — Z8249 Family history of ischemic heart disease and other diseases of the circulatory system: Secondary | ICD-10-CM | POA: Diagnosis not present

## 2013-09-16 DIAGNOSIS — I2510 Atherosclerotic heart disease of native coronary artery without angina pectoris: Secondary | ICD-10-CM

## 2013-09-16 DIAGNOSIS — I10 Essential (primary) hypertension: Secondary | ICD-10-CM | POA: Diagnosis present

## 2013-09-16 DIAGNOSIS — M79609 Pain in unspecified limb: Secondary | ICD-10-CM | POA: Diagnosis present

## 2013-09-16 DIAGNOSIS — N390 Urinary tract infection, site not specified: Secondary | ICD-10-CM | POA: Diagnosis present

## 2013-09-16 DIAGNOSIS — Z5331 Laparoscopic surgical procedure converted to open procedure: Secondary | ICD-10-CM

## 2013-09-16 DIAGNOSIS — I739 Peripheral vascular disease, unspecified: Secondary | ICD-10-CM | POA: Diagnosis present

## 2013-09-16 DIAGNOSIS — K219 Gastro-esophageal reflux disease without esophagitis: Secondary | ICD-10-CM | POA: Diagnosis present

## 2013-09-16 DIAGNOSIS — J9600 Acute respiratory failure, unspecified whether with hypoxia or hypercapnia: Secondary | ICD-10-CM | POA: Diagnosis not present

## 2013-09-16 DIAGNOSIS — Z9861 Coronary angioplasty status: Secondary | ICD-10-CM | POA: Diagnosis not present

## 2013-09-16 DIAGNOSIS — G4733 Obstructive sleep apnea (adult) (pediatric): Secondary | ICD-10-CM | POA: Diagnosis present

## 2013-09-16 DIAGNOSIS — K821 Hydrops of gallbladder: Secondary | ICD-10-CM | POA: Diagnosis present

## 2013-09-16 DIAGNOSIS — Z7902 Long term (current) use of antithrombotics/antiplatelets: Secondary | ICD-10-CM | POA: Diagnosis not present

## 2013-09-16 DIAGNOSIS — Z888 Allergy status to other drugs, medicaments and biological substances status: Secondary | ICD-10-CM | POA: Diagnosis not present

## 2013-09-16 DIAGNOSIS — K8000 Calculus of gallbladder with acute cholecystitis without obstruction: Secondary | ICD-10-CM | POA: Diagnosis present

## 2013-09-16 DIAGNOSIS — M549 Dorsalgia, unspecified: Secondary | ICD-10-CM | POA: Diagnosis present

## 2013-09-16 DIAGNOSIS — J449 Chronic obstructive pulmonary disease, unspecified: Secondary | ICD-10-CM | POA: Diagnosis present

## 2013-09-16 DIAGNOSIS — I252 Old myocardial infarction: Secondary | ICD-10-CM | POA: Diagnosis not present

## 2013-09-16 DIAGNOSIS — G8929 Other chronic pain: Secondary | ICD-10-CM | POA: Diagnosis present

## 2013-09-16 DIAGNOSIS — F411 Generalized anxiety disorder: Secondary | ICD-10-CM | POA: Diagnosis present

## 2013-09-16 DIAGNOSIS — Z6836 Body mass index (BMI) 36.0-36.9, adult: Secondary | ICD-10-CM | POA: Diagnosis not present

## 2013-09-16 DIAGNOSIS — I4891 Unspecified atrial fibrillation: Secondary | ICD-10-CM | POA: Diagnosis not present

## 2013-09-16 DIAGNOSIS — Z881 Allergy status to other antibiotic agents status: Secondary | ICD-10-CM | POA: Diagnosis not present

## 2013-09-16 DIAGNOSIS — E739 Lactose intolerance, unspecified: Secondary | ICD-10-CM | POA: Diagnosis present

## 2013-09-16 DIAGNOSIS — D649 Anemia, unspecified: Secondary | ICD-10-CM | POA: Diagnosis present

## 2013-09-16 DIAGNOSIS — R7402 Elevation of levels of lactic acid dehydrogenase (LDH): Secondary | ICD-10-CM | POA: Diagnosis not present

## 2013-09-16 DIAGNOSIS — K8010 Calculus of gallbladder with chronic cholecystitis without obstruction: Secondary | ICD-10-CM

## 2013-09-16 DIAGNOSIS — G609 Hereditary and idiopathic neuropathy, unspecified: Secondary | ICD-10-CM | POA: Diagnosis present

## 2013-09-16 DIAGNOSIS — E785 Hyperlipidemia, unspecified: Secondary | ICD-10-CM | POA: Diagnosis present

## 2013-09-16 DIAGNOSIS — I4892 Unspecified atrial flutter: Secondary | ICD-10-CM | POA: Diagnosis not present

## 2013-09-16 DIAGNOSIS — Z87891 Personal history of nicotine dependence: Secondary | ICD-10-CM | POA: Diagnosis not present

## 2013-09-16 DIAGNOSIS — Z7982 Long term (current) use of aspirin: Secondary | ICD-10-CM | POA: Diagnosis not present

## 2013-09-16 HISTORY — PX: CHOLECYSTECTOMY: SHX55

## 2013-09-16 LAB — COMPREHENSIVE METABOLIC PANEL
ALT: 22 U/L (ref 0–35)
AST: 19 U/L (ref 0–37)
Albumin: 2.8 g/dL — ABNORMAL LOW (ref 3.5–5.2)
Alkaline Phosphatase: 89 U/L (ref 39–117)
Anion gap: 15 (ref 5–15)
BUN: 13 mg/dL (ref 6–23)
CO2: 25 mEq/L (ref 19–32)
Calcium: 8.7 mg/dL (ref 8.4–10.5)
Chloride: 98 mEq/L (ref 96–112)
Creatinine, Ser: 0.69 mg/dL (ref 0.50–1.10)
GFR calc Af Amer: >90 mL/min (ref >90)
GFR calc non Af Amer: >90 mL/min (ref >90)
Glucose, Bld: 116 mg/dL — ABNORMAL HIGH (ref 70–99)
Potassium: 3.7 mEq/L (ref 3.7–5.3)
Sodium: 138 mEq/L (ref 137–147)
Total Bilirubin: 0.6 mg/dL (ref 0.3–1.2)
Total Protein: 6.8 g/dL (ref 6.0–8.3)

## 2013-09-16 LAB — CBC
HCT: 35.2 % — ABNORMAL LOW (ref 36.0–46.0)
Hemoglobin: 11.2 g/dL — ABNORMAL LOW (ref 12.0–15.0)
MCH: 29.9 pg (ref 26.0–34.0)
MCHC: 31.8 g/dL (ref 30.0–36.0)
MCV: 93.9 fL (ref 78.0–100.0)
Platelets: 236 10*3/uL (ref 150–400)
RBC: 3.75 MIL/uL — ABNORMAL LOW (ref 3.87–5.11)
RDW: 14.1 % (ref 11.5–15.5)
WBC: 11.8 10*3/uL — ABNORMAL HIGH (ref 4.0–10.5)

## 2013-09-16 SURGERY — LAPAROSCOPIC CHOLECYSTECTOMY WITH INTRAOPERATIVE CHOLANGIOGRAM
Anesthesia: General

## 2013-09-16 MED ORDER — ROCURONIUM BROMIDE 100 MG/10ML IV SOLN
INTRAVENOUS | Status: DC | PRN
  Administered 2013-09-16 (×3): 10 mg via INTRAVENOUS

## 2013-09-16 MED ORDER — SUCCINYLCHOLINE CHLORIDE 20 MG/ML IJ SOLN
INTRAMUSCULAR | Status: DC | PRN
  Administered 2013-09-16: 120 mg via INTRAVENOUS

## 2013-09-16 MED ORDER — NALOXONE HCL 0.4 MG/ML IJ SOLN: 0.4000 mg | INTRAMUSCULAR | Status: DC | PRN

## 2013-09-16 MED ORDER — BUPIVACAINE-EPINEPHRINE (PF) 0.25% -1:200000 IJ SOLN
INTRAMUSCULAR | Status: AC
  Filled 2013-09-16: qty 30

## 2013-09-16 MED ORDER — ONDANSETRON HCL 4 MG/2ML IJ SOLN
INTRAMUSCULAR | Status: AC
  Filled 2013-09-16: qty 2

## 2013-09-16 MED ORDER — OXYCODONE HCL 5 MG PO TABS: 5.0000 mg | ORAL_TABLET | Freq: Once | ORAL | Status: DC | PRN

## 2013-09-16 MED ORDER — FENTANYL CITRATE 0.05 MG/ML IJ SOLN
INTRAMUSCULAR | Status: AC
  Filled 2013-09-16: qty 5

## 2013-09-16 MED ORDER — SODIUM CHLORIDE 0.9 % IV SOLN
INTRAVENOUS | Status: DC | PRN
  Administered 2013-09-16: 17:00:00 via INTRAVENOUS

## 2013-09-16 MED ORDER — HYDROMORPHONE HCL PF 1 MG/ML IJ SOLN
INTRAMUSCULAR | Status: AC
  Filled 2013-09-16: qty 1

## 2013-09-16 MED ORDER — HEMOSTATIC AGENTS (NO CHARGE) OPTIME
TOPICAL | Status: DC | PRN
  Administered 2013-09-16: 2 via TOPICAL

## 2013-09-16 MED ORDER — PROPOFOL 10 MG/ML IV BOLUS
INTRAVENOUS | Status: DC | PRN
  Administered 2013-09-16: 150 mg via INTRAVENOUS

## 2013-09-16 MED ORDER — ONDANSETRON HCL 4 MG/2ML IJ SOLN: 4.0000 mg | Freq: Four times a day (QID) | INTRAMUSCULAR | Status: DC | PRN

## 2013-09-16 MED ORDER — PHENYLEPHRINE 40 MCG/ML (10ML) SYRINGE FOR IV PUSH (FOR BLOOD PRESSURE SUPPORT)
PREFILLED_SYRINGE | INTRAVENOUS | Status: AC
  Filled 2013-09-16: qty 10

## 2013-09-16 MED ORDER — SODIUM CHLORIDE 0.9 % IV SOLN
INTRAVENOUS | Status: DC | PRN
  Administered 2013-09-16: 18:00:00

## 2013-09-16 MED ORDER — SODIUM CHLORIDE 0.9 % IJ SOLN: 9.0000 mL | INTRAMUSCULAR | Status: DC | PRN

## 2013-09-16 MED ORDER — OXYCODONE HCL 5 MG/5ML PO SOLN: 5.0000 mg | Freq: Once | ORAL | Status: DC | PRN

## 2013-09-16 MED ORDER — DEXAMETHASONE SODIUM PHOSPHATE 4 MG/ML IJ SOLN
INTRAMUSCULAR | Status: DC | PRN
  Administered 2013-09-16: 4 mg via INTRAVENOUS

## 2013-09-16 MED ORDER — BUPIVACAINE-EPINEPHRINE 0.25% -1:200000 IJ SOLN
INTRAMUSCULAR | Status: DC | PRN
  Administered 2013-09-16: 30 mL

## 2013-09-16 MED ORDER — ONDANSETRON HCL 4 MG/2ML IJ SOLN
INTRAMUSCULAR | Status: DC | PRN
  Administered 2013-09-16: 4 mg via INTRAVENOUS

## 2013-09-16 MED ORDER — GLYCOPYRROLATE 0.2 MG/ML IJ SOLN
INTRAMUSCULAR | Status: AC
  Filled 2013-09-16: qty 4

## 2013-09-16 MED ORDER — MIDAZOLAM HCL 2 MG/2ML IJ SOLN
INTRAMUSCULAR | Status: AC
  Filled 2013-09-16: qty 2

## 2013-09-16 MED ORDER — HYDROMORPHONE HCL PF 1 MG/ML IJ SOLN
0.2500 mg | INTRAMUSCULAR | Status: DC | PRN
  Administered 2013-09-16: 0.5 mg via INTRAVENOUS

## 2013-09-16 MED ORDER — MIDAZOLAM HCL 5 MG/5ML IJ SOLN
INTRAMUSCULAR | Status: DC | PRN
  Administered 2013-09-16: 2 mg via INTRAVENOUS

## 2013-09-16 MED ORDER — SODIUM CHLORIDE 0.9 % IR SOLN
Status: DC | PRN
  Administered 2013-09-16: 1000 mL

## 2013-09-16 MED ORDER — HYDROMORPHONE 0.3 MG/ML IV SOLN
INTRAVENOUS | Status: DC
  Administered 2013-09-16: 20:00:00 via INTRAVENOUS
  Administered 2013-09-17: 3.6 mg via INTRAVENOUS
  Administered 2013-09-17: 1.8 mg via INTRAVENOUS
  Administered 2013-09-17: 02:00:00 via INTRAVENOUS
  Administered 2013-09-17: 4.5 mg via INTRAVENOUS
  Administered 2013-09-17: 5.62 mg via INTRAVENOUS
  Administered 2013-09-17: 1.8 mg via INTRAVENOUS
  Administered 2013-09-17 (×2): via INTRAVENOUS
  Administered 2013-09-17: 3.6 mg via INTRAVENOUS
  Administered 2013-09-18: 3.43 mg via INTRAVENOUS
  Administered 2013-09-18: 2.54 mg via INTRAVENOUS
  Administered 2013-09-18: 2.1 mg via INTRAVENOUS
  Administered 2013-09-18: 07:00:00 via INTRAVENOUS
  Filled 2013-09-16 (×4): qty 25

## 2013-09-16 MED ORDER — LIDOCAINE HCL (CARDIAC) 20 MG/ML IV SOLN
INTRAVENOUS | Status: AC
  Filled 2013-09-16: qty 5

## 2013-09-16 MED ORDER — LACTATED RINGERS IV SOLN
INTRAVENOUS | Status: DC | PRN
  Administered 2013-09-16: 17:00:00 via INTRAVENOUS

## 2013-09-16 MED ORDER — SUCCINYLCHOLINE CHLORIDE 20 MG/ML IJ SOLN
INTRAMUSCULAR | Status: AC
  Filled 2013-09-16: qty 1

## 2013-09-16 MED ORDER — HYDROMORPHONE 0.3 MG/ML IV SOLN
INTRAVENOUS | Status: AC
  Filled 2013-09-16: qty 25

## 2013-09-16 MED ORDER — MEPERIDINE HCL 25 MG/ML IJ SOLN: 6.2500 mg | INTRAMUSCULAR | Status: DC | PRN

## 2013-09-16 MED ORDER — ROCURONIUM BROMIDE 50 MG/5ML IV SOLN
INTRAVENOUS | Status: AC
  Filled 2013-09-16: qty 1

## 2013-09-16 MED ORDER — DEXAMETHASONE SODIUM PHOSPHATE 4 MG/ML IJ SOLN
INTRAMUSCULAR | Status: AC
  Filled 2013-09-16: qty 1

## 2013-09-16 MED ORDER — HYDROMORPHONE HCL PF 1 MG/ML IJ SOLN
0.2500 mg | INTRAMUSCULAR | Status: DC | PRN
  Administered 2013-09-16 (×4): 0.5 mg via INTRAVENOUS

## 2013-09-16 MED ORDER — PHENYLEPHRINE HCL 10 MG/ML IJ SOLN
INTRAMUSCULAR | Status: DC | PRN
  Administered 2013-09-16 (×2): 40 ug via INTRAVENOUS
  Administered 2013-09-16: 80 ug via INTRAVENOUS
  Administered 2013-09-16: 120 ug via INTRAVENOUS

## 2013-09-16 MED ORDER — LIDOCAINE HCL (CARDIAC) 20 MG/ML IV SOLN
INTRAVENOUS | Status: DC | PRN
  Administered 2013-09-16: 50 mg via INTRAVENOUS

## 2013-09-16 MED ORDER — GLYCOPYRROLATE 0.2 MG/ML IJ SOLN
INTRAMUSCULAR | Status: DC | PRN
  Administered 2013-09-16: 0.1 mg via INTRAVENOUS
  Administered 2013-09-16: 0.6 mg via INTRAVENOUS
  Administered 2013-09-16: 0.1 mg via INTRAVENOUS

## 2013-09-16 MED ORDER — FENTANYL CITRATE 0.05 MG/ML IJ SOLN
INTRAMUSCULAR | Status: DC | PRN
  Administered 2013-09-16 (×4): 50 ug via INTRAVENOUS
  Administered 2013-09-16: 100 ug via INTRAVENOUS
  Administered 2013-09-16: 50 ug via INTRAVENOUS
  Administered 2013-09-16: 150 ug via INTRAVENOUS

## 2013-09-16 MED ORDER — ONDANSETRON HCL 4 MG/2ML IJ SOLN: 4.0000 mg | Freq: Once | INTRAMUSCULAR | Status: DC | PRN

## 2013-09-16 MED ORDER — NEOSTIGMINE METHYLSULFATE 10 MG/10ML IV SOLN
INTRAVENOUS | Status: DC | PRN
  Administered 2013-09-16: 4 mg via INTRAVENOUS

## 2013-09-16 MED ORDER — PROPOFOL 10 MG/ML IV BOLUS
INTRAVENOUS | Status: AC
  Filled 2013-09-16: qty 20

## 2013-09-16 SURGICAL SUPPLY — 43 items
APL SKNCLS STERI-STRIP NONHPOA (GAUZE/BANDAGES/DRESSINGS) ×1
APPLIER CLIP 5 13 M/L LIGAMAX5 (MISCELLANEOUS) ×3
APR CLP MED LRG 5 ANG JAW (MISCELLANEOUS) ×1
BAG SPEC RTRVL LRG 6X4 10 (ENDOMECHANICALS) ×1
BANDAGE ADH SHEER 1  50/CT (GAUZE/BANDAGES/DRESSINGS) ×12 IMPLANT
BENZOIN TINCTURE PRP APPL 2/3 (GAUZE/BANDAGES/DRESSINGS) ×3 IMPLANT
CANISTER SUCTION 2500CC (MISCELLANEOUS) ×3 IMPLANT
CATH REDDICK CHOLANGI 4FR 50CM (CATHETERS) ×2 IMPLANT
CHLORAPREP W/TINT 26ML (MISCELLANEOUS) ×3 IMPLANT
CLIP APPLIE 5 13 M/L LIGAMAX5 (MISCELLANEOUS) ×1 IMPLANT
COVER MAYO STAND STRL (DRAPES) ×2 IMPLANT
COVER SURGICAL LIGHT HANDLE (MISCELLANEOUS) ×3 IMPLANT
DECANTER SPIKE VIAL GLASS SM (MISCELLANEOUS) ×1 IMPLANT
DRAPE C-ARM 42X72 X-RAY (DRAPES) ×2 IMPLANT
DRAPE UTILITY 15X26 W/TAPE STR (DRAPE) ×6 IMPLANT
DRSG PAD ABDOMINAL 8X10 ST (GAUZE/BANDAGES/DRESSINGS) ×2 IMPLANT
ELECT REM PT RETURN 9FT ADLT (ELECTROSURGICAL) ×3
ELECTRODE REM PT RTRN 9FT ADLT (ELECTROSURGICAL) ×1 IMPLANT
GLOVE SURG SIGNA 7.5 PF LTX (GLOVE) ×3 IMPLANT
GOWN STRL REUS W/ TWL LRG LVL3 (GOWN DISPOSABLE) ×3 IMPLANT
GOWN STRL REUS W/ TWL XL LVL3 (GOWN DISPOSABLE) ×1 IMPLANT
GOWN STRL REUS W/TWL LRG LVL3 (GOWN DISPOSABLE) ×9
GOWN STRL REUS W/TWL XL LVL3 (GOWN DISPOSABLE) ×3
KIT BASIN OR (CUSTOM PROCEDURE TRAY) ×3 IMPLANT
KIT ROOM TURNOVER OR (KITS) ×3 IMPLANT
NS IRRIG 1000ML POUR BTL (IV SOLUTION) ×3 IMPLANT
PAD ARMBOARD 7.5X6 YLW CONV (MISCELLANEOUS) ×3 IMPLANT
POUCH SPECIMEN RETRIEVAL 10MM (ENDOMECHANICALS) ×2 IMPLANT
SCISSORS LAP 5X35 DISP (ENDOMECHANICALS) ×3 IMPLANT
SET CHOLANGIOGRAPH 5 50 .035 (SET/KITS/TRAYS/PACK) IMPLANT
SET IRRIG TUBING LAPAROSCOPIC (IRRIGATION / IRRIGATOR) ×3 IMPLANT
SLEEVE ENDOPATH XCEL 5M (ENDOMECHANICALS) ×6 IMPLANT
SPECIMEN JAR SMALL (MISCELLANEOUS) ×3 IMPLANT
SPONGE GAUZE 4X4 12PLY STER LF (GAUZE/BANDAGES/DRESSINGS) ×2 IMPLANT
SUT MON AB 4-0 PC3 18 (SUTURE) ×3 IMPLANT
SUT PDS AB 1 CTX 36 (SUTURE) ×4 IMPLANT
SUT PDS AB 3-0 SH 27 (SUTURE) ×4 IMPLANT
TAPE CLOTH SURG 4X10 WHT LF (GAUZE/BANDAGES/DRESSINGS) ×2 IMPLANT
TOWEL OR 17X24 6PK STRL BLUE (TOWEL DISPOSABLE) ×3 IMPLANT
TOWEL OR 17X26 10 PK STRL BLUE (TOWEL DISPOSABLE) ×3 IMPLANT
TRAY LAPAROSCOPIC (CUSTOM PROCEDURE TRAY) ×3 IMPLANT
TROCAR XCEL BLUNT TIP 100MML (ENDOMECHANICALS) ×3 IMPLANT
TROCAR XCEL NON-BLD 5MMX100MML (ENDOMECHANICALS) ×3 IMPLANT

## 2013-09-16 NOTE — Transfer of Care (Signed)
Immediate Anesthesia Transfer of Care Note  Patient: Wendy [name]  Procedure(s) Performed: Procedure(s): LAPAROSCOPIC CHOLECYSTECTOMY CONVERTED TO OPEN CHOLECYSTECTOMY WITH CHOLANGIOGRAM (N/A)  Patient Location: PACU  Anesthesia Type:General  Level of Consciousness: awake and alert   Airway & Oxygen Therapy: Patient Spontanous Breathing and Patient connected to nasal cannula oxygen  Post-op Assessment: Report given to PACU RN and Post -op Vital signs reviewed and stable  Post vital signs: Reviewed and stable  Complications: No apparent anesthesia complications

## 2013-09-16 NOTE — Progress Notes (Signed)
PATIENT DETAILS Name: Wendy [name] Age: 63 y.o. 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 Marcelino Scot  Active Problems:   CAD (coronary artery disease) -stable, Plavix on hold, c/w ASA -Last PCI on 2013, recent [facility] on May 2015-med management recommended  Hypertension  -Stable.  -c/w Metoprolol and Dyazide  Hyperlipidemia  -Continue statin.   COPD  -Stable at this time. Continue home inhalers.   Obstructive sleep apnea  -Continue CPAP at bedtime.   GERD  -Continue PPI.   Anxiety  -Benzodiazepines as needed.   Peripheral artery disease  -Stable.  Disposition: Remain inpatient  DVT Prophylaxis: Prophylactic Lovenox   Code Status: Full code   Family Communication None at bedside  Procedures:  None  CONSULTS:  general surgery  Time spent 40 minutes-which includes 50% of the time with face-to-face with patient/ family and coordinating care related to the above assessment and plan.    MEDICATIONS: Scheduled Meds: . aspirin EC  81 mg Oral Daily  . ciprofloxacin  400 mg Intravenous Q12H  . docusate sodium  100 mg Oral BID  . enoxaparin (LOVENOX) injection  40 mg Subcutaneous Q24H  . ezetimibe  10 mg Oral Daily  . fluticasone  1 puff Inhalation BID  . loratadine  10 mg Oral Daily  . metoprolol  100 mg Oral BID  . mometasone-formoterol  2 puff Inhalation BID  . pantoprazole  80 mg Oral BID  . simvastatin  20 mg Oral q1800  . tiotropium  18 mcg Inhalation Daily  . triamterene-hydrochlorothiazide  1 capsule Oral Daily   Continuous Infusions:  PRN  Meds:.acetaminophen, acetaminophen, albuterol, ALPRAZolam, HYDROmorphone (DILAUDID) injection, methocarbamol, ondansetron (ZOFRAN) IV, ondansetron  Antibiotics: Anti-infectives   Start     Dose/Rate Route Frequency Ordered Stop   09/15/13 2130  ciprofloxacin (CIPRO) IVPB 400 mg     400 mg 200 mL/hr over 60 Minutes Intravenous Every 12 hours 09/15/13 2120     09/15/13 1815  ciprofloxacin (CIPRO) IVPB 400 mg     400 mg 200 mL/hr over 60 Minutes Intravenous  Once 09/15/13 1812 09/15/13 2110       PHYSICAL EXAM: Vital signs in last 24 hours: Filed Vitals:   09/15/13 2129 09/15/13 2216 09/16/13 0617 09/16/13 1202  BP: 144/81  140/58   Pulse: 87  86   Temp: 98.6 F (37 C)  97.8 F (36.6 C)   TempSrc: Oral  Oral   Resp: 18  19   Height:      Weight: 88 kg (194 lb 0.1 oz)  88 kg (194 lb 0.1 oz)   SpO2: 95% 93% 95% 95%    Weight change:  Filed Weights   09/15/13 1121 09/15/13 2129 09/16/13 0617  Weight: 88.451 kg (195 lb) 88 kg (194 lb 0.1 oz) 88 kg (194 lb 0.1 oz)   Body mass index is 35.47 kg/(m^2).   Gen Exam: Awake and alert with clear speech.   Neck: Supple, No JVD.   Chest: B/L Clear.   CVS: S1 S2 Regular, no murmurs.  Abdomen: soft, BS +, tender RUQ, non distended.  Extremities: no edema, lower extremities warm to touch. Neurologic:  Non Focal.   Skin: No Rash.   Wounds: N/A.   Intake/Output from previous day:  Intake/Output Summary (Last 24 hours) at 09/16/13 1259 Last data filed at 09/16/13 0900  Gross per 24 hour  Intake    700 ml  Output    350 ml  Net    350 ml     LAB RESULTS: CBC  Recent Labs Lab 09/15/13 1302 09/16/13 0504  WBC 10.5 11.8*  HGB 12.5 11.2*  HCT 38.3 35.2*  PLT 255 236  MCV 91.8 93.9  MCH 30.0 29.9  MCHC 32.6 31.8  RDW 13.9 14.1  LYMPHSABS 1.1  --   MONOABS 0.8  --   EOSABS 0.0  --   BASOSABS 0.0  --     Chemistries   Recent Labs Lab 09/15/13 1302 09/16/13 0504  NA 143 138  K 3.5* 3.7  CL 99 98  CO2 23 25    GLUCOSE 111* 116*  BUN 16 13  CREATININE 0.69 0.69  CALCIUM 9.6 8.7    CBG: No results found for this basename: GLUCAP,  in the last 168 hours  GFR Estimated Creatinine Clearance: 76.1 ml/min (by C-G formula based on Cr of 0.69).  Coagulation profile No results found for this basename: INR, PROTIME,  in the last 168 hours  Cardiac Enzymes No results found for this basename: CK, CKMB, TROPONINI, MYOGLOBIN,  in the last 168 hours  No components found with this basename: POCBNP,  No results found for this basename: DDIMER,  in the last 72 hours No results found for this basename: HGBA1C,  in the last 72 hours No results found for this basename: CHOL, HDL, LDLCALC, TRIG, CHOLHDL, LDLDIRECT,  in the last 72 hours No results found for this basename: TSH, T4TOTAL, FREET3, T3FREE, THYROIDAB,  in the last 72 hours No results found for this basename: VITAMINB12, FOLATE, FERRITIN, TIBC, IRON, RETICCTPCT,  in the last 72 hours  Recent Labs  09/15/13 1302  LIPASE 9*    Urine Studies No results found for this basename: UACOL, UAPR, USPG, UPH, UTP, UGL, UKET, UBIL, UHGB, UNIT, UROB, ULEU, UEPI, UWBC, URBC, UBAC, CAST, CRYS, UCOM, BILUA,  in the last 72 hours  MICROBIOLOGY: No results found for this or any previous visit (from the past 240 hour(s)).  RADIOLOGY STUDIES/RESULTS: Dg Chest 2 View  08/26/2013   CLINICAL DATA:  Preoperative chest x-ray prior to left humeral repair  EXAM: CHEST  2 VIEW  COMPARISON:  Prior chest x-ray 07/19/2013  FINDINGS: Cardiac and mediastinal contours remain within normal limits. Patient is status post median sternotomy with evidence of multivessel CABG including LIMA bypass. Metallic stents project over the right and left anterior descending coronary arteries. The lungs are clear. No pleural effusion or pneumothorax. Incompletely imaged left mid humeral fracture. No acute osseous abnormality.  IMPRESSION: No active cardiopulmonary disease.  Evidence of prior  percutaneous coronary artery stenting and multivessel CABG suggests a significant past history of coronary artery disease.   Electronically Signed   By: Jacqulynn Cadet M.D.   On: 08/26/2013 14:14   US Abdomen Complete  09/15/2013   CLINICAL DATA:  Abdominal pain  EXAM: ULTRASOUND ABDOMEN COMPLETE  COMPARISON:  CT abdomen and pelvis September 15, 2013  FINDINGS: Gallbladder:  Sludge is seen within the gallbladder. There are no echogenic foci which move and shadowing consistent with gallstones. The gallbladder wall is thickened at 5 mm in thickness. There is no gallbladder wall edema or pericholecystic fluid. No sonographic [name]  sign noted.  Common bile duct:  Diameter: 6 mm. There is no intrahepatic, common hepatic, common bile duct dilatation.  Liver:  No focal lesion identified. Liver echogenicity is diffusely increased.  IVC:  No abnormality visualized.  Pancreas:  Visualized portion unremarkable. Portions of the pancreas were obscured by gas.  Spleen:  Size and appearance within normal limits.  Right Kidney:  Length: 10.2 cm. Echogenicity within normal limits. No mass or hydronephrosis visualized.  Left Kidney:  Length: 10.8 cm. Echogenicity within normal limits. No mass or hydronephrosis visualized. There is scarring in the lower pole region  Abdominal aorta:  No aneurysm visualized.  Portions of the aorta are obscured by gas.  Other findings:  No demonstrable ascites.  IMPRESSION: Gallbladder wall is thickened. There is no gallbladder wall edema or pericholecystic fluid, however. There is sludge in the gallbladder but no appreciable gallstones. The wall thickening could indicate a degree of acalculus cholecystitis.  Portions of the pancreas and abdominal aorta are obscured by gas. Visualized portions of these structures appear normal.  Increased echogenicity liver consistent with fatty change. While no focal liver lesions are identified, it must be cautioned that the sensitivity ofultrasound for focal liver  lesions is diminished in this circumstance.   Electronically Signed   By: Lowella Grip M.D.   On: 09/15/2013 17:13   Ct Abdomen Pelvis W Contrast  09/15/2013   CLINICAL DATA:  Intermittent mid and upper abdominal pain beginning 1 week ago.  EXAM: CT ABDOMEN AND PELVIS WITH CONTRAST  TECHNIQUE: Multidetector CT imaging of the abdomen and pelvis was performed using the standard protocol following bolus administration of intravenous contrast.  CONTRAST:  100 mL OMNIPAQUE IOHEXOL 300 MG/ML  SOLN  COMPARISON:  Aortic ultrasound 06/05/2013.  FINDINGS: Mild dependent atelectasis is seen in the lung bases. No pleural or pericardial effusion.  The liver is diffusely low attenuating consistent with fatty infiltration. No focal liver lesion is seen. The gallbladder is mildly dilated and there is some stranding about the gallbladder. The pancreas, adrenal glands, spleen and kidneys appear normal.  Fem-fem bypass graft is identified and opacifies with contrast material. There is aortoiliac atherosclerosis without aneurysm. 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 Left common iliac artery also may be occluded with reconstitution of the internal and external iliacs.  The stomach, small bowel and appendix appear normal. No lymphadenopathy is identified.  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 No femoral head fragmentation or collapse is identified. Lumbar spondylosis is noted.  IMPRESSION: Dilated gallbladder with pericholecystic stranding worrisome for cholecystitis. Right upper quadrant ultrasound would be useful further evaluation.  Diffuse fatty infiltration of the liver.  Extensive vascular disease as detailed above. Fem-fem bypass graft appears patent.  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   Electronically Signed   By: Inge Rise M.D.   On: 09/15/2013 15:16   Dg Humerus Left  08/27/2013    CLINICAL DATA:  Postop  EXAM: LEFT HUMERUS - 2+ VIEW  COMPARISON:  Intraoperative C-arm spot films  FINDINGS: Plate and screw fixation of the comminuted fracture of the mid proximal left humerus is noted. The fixation plate is in good position. No complicating features are seen.  IMPRESSION: Fixation of mid proximal comminuted left humeral fracture.   Electronically Signed   By: Ivar Drape M.D.   On: 08/27/2013 15:27   Dg Humerus Left  08/27/2013   CLINICAL DATA:  Humerus fracture.  EXAM: LEFT HUMERUS - 2+ VIEW  COMPARISON:  None.  FINDINGS: Patient status post open reduction internal fixation of comminuted fracture of the left humerus. Evidence of prior plate and screw fixation noted. This fracture may be old with nonunion. Repeat fracture at site of prior fracture.  IMPRESSION: Open reduction internal fixation left humeral fracture described above.   Electronically Signed   By: Marcello Moores  Register   On: 08/27/2013 12:44    Oren Binet, MD  [facility] Pager:[phone]  If 7PM-7AM, please contact night-coverage [email or web address] Password [email or web address] 09/16/2013, 12:59 PM   LOS: 1 day   **Disclaimer: This note may have been dictated with voice recognition software. Similar sounding words can inadvertently be transcribed and this note may contain transcription errors which may not have been corrected upon publication of note.**

## 2013-09-16 NOTE — Progress Notes (Signed)
Patient ID: Wendy [name], female   DOB: [birth year], 63 y.o.   MRN: [id number]    Subjective: Patient very sedated and sleepy, but continues to have right upper quadrant abdominal pain.  Objective: Vital signs in last 24 hours: Temp:  [97.8 F (36.6 C)-98.6 F (37 C)] 97.8 F (36.6 C) (07/13 0617) Pulse Rate:  [67-103] 86 (07/13 0617) Resp:  [15-28] 19 (07/13 0617) BP: (124-170)/(55-81) 140/58 mmHg (07/13 0617) SpO2:  [88 %-100 %] 95 % (07/13 0617) Weight:  [194 lb 0.1 oz (88 kg)-195 lb (88.451 kg)] 194 lb 0.1 oz (88 kg) (07/13 0617) Last BM Date: 09/15/13  Intake/Output from previous day: 07/12 0701 - 07/13 0700 In: 700 [I.V.:700] Out: -  Intake/Output this shift:    PE: Abd: Soft, very tender in the right upper quadrant, active bowel sounds, nondistended  Lab Results:   Recent Labs  09/15/13 1302 09/16/13 0504  WBC 10.5 11.8*  HGB 12.5 11.2*  HCT 38.3 35.2*  PLT 255 236   BMET  Recent Labs  09/15/13 1302 09/16/13 0504  NA 143 138  K 3.5* 3.7  CL 99 98  CO2 23 25  GLUCOSE 111* 116*  BUN 16 13  CREATININE 0.69 0.69  CALCIUM 9.6 8.7   PT/INR No results found for this basename: LABPROT, INR,  in the last 72 hours CMP     Component Value Date/Time   NA 138 09/16/2013 0504   K 3.7 09/16/2013 0504   CL 98 09/16/2013 0504   CO2 25 09/16/2013 0504   GLUCOSE 116* 09/16/2013 0504   BUN 13 09/16/2013 0504   CREATININE 0.69 09/16/2013 0504   CALCIUM 8.7 09/16/2013 0504   PROT 6.8 09/16/2013 0504   ALBUMIN 2.8* 09/16/2013 0504   AST 19 09/16/2013 0504   ALT 22 09/16/2013 0504   ALKPHOS 89 09/16/2013 0504   BILITOT 0.6 09/16/2013 0504   GFRNONAA >90 09/16/2013 0504   GFRAA >90 09/16/2013 0504   Lipase     Component Value Date/Time   LIPASE 9* 09/15/2013 1302       Studies/Results: US Abdomen Complete  09/15/2013   CLINICAL DATA:  Abdominal pain  EXAM: ULTRASOUND ABDOMEN COMPLETE  COMPARISON:  CT abdomen and pelvis September 15, 2013  FINDINGS: Gallbladder:  Sludge  is seen within the gallbladder. There are no echogenic foci which move and shadowing consistent with gallstones. The gallbladder wall is thickened at 5 mm in thickness. There is no gallbladder wall edema or pericholecystic fluid. No sonographic Murphy sign noted.  Common bile duct:  Diameter: 6 mm. There is no intrahepatic, common hepatic, common bile duct dilatation.  Liver:  No focal lesion identified. Liver echogenicity is diffusely increased.  IVC:  No abnormality visualized.  Pancreas:  Visualized portion unremarkable. Portions of the pancreas were obscured by gas.  Spleen:  Size and appearance within normal limits.  Right Kidney:  Length: 10.2 cm. Echogenicity within normal limits. No mass or hydronephrosis visualized.  Left Kidney:  Length: 10.8 cm. Echogenicity within normal limits. No mass or hydronephrosis visualized. There is scarring in the lower pole region  Abdominal aorta:  No aneurysm visualized.  Portions of the aorta are obscured by gas.  Other findings:  No demonstrable ascites.  IMPRESSION: Gallbladder wall is thickened. There is no gallbladder wall edema or pericholecystic fluid, however. There is sludge in the gallbladder but no appreciable gallstones. The wall thickening could indicate a degree of acalculus cholecystitis.  Portions of the pancreas and abdominal aorta are  obscured by gas. Visualized portions of these structures appear normal.  Increased echogenicity liver consistent with fatty change. While no focal liver lesions are identified, it must be cautioned that the sensitivity ofultrasound for focal liver lesions is diminished in this circumstance.   Electronically Signed   By: Lowella Grip M.D.   On: 09/15/2013 17:13   Ct Abdomen Pelvis W Contrast  09/15/2013   CLINICAL DATA:  Intermittent mid and upper abdominal pain beginning 1 week ago.  EXAM: CT ABDOMEN AND PELVIS WITH CONTRAST  TECHNIQUE: Multidetector CT imaging of the abdomen and pelvis was performed using the standard  protocol following bolus administration of intravenous contrast.  CONTRAST:  100 mL OMNIPAQUE IOHEXOL 300 MG/ML  SOLN  COMPARISON:  Aortic ultrasound 06/05/2013.  FINDINGS: Mild dependent atelectasis is seen in the lung bases. No pleural or pericardial effusion.  The liver is diffusely low attenuating consistent with fatty infiltration. No focal liver lesion is seen. The gallbladder is mildly dilated and there is some stranding about the gallbladder. The pancreas, adrenal glands, spleen and kidneys appear normal.  Fem-fem bypass graft is identified and opacifies with contrast material. There is aortoiliac atherosclerosis without aneurysm. 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 Left common iliac artery also may be occluded with reconstitution of the internal and external iliacs.  The stomach, small bowel and appendix appear normal. No lymphadenopathy is identified.  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 No femoral head fragmentation or collapse is identified. Lumbar spondylosis is noted.  IMPRESSION: Dilated gallbladder with pericholecystic stranding worrisome for cholecystitis. Right upper quadrant ultrasound would be useful further evaluation.  Diffuse fatty infiltration of the liver.  Extensive vascular disease as detailed above. Fem-fem bypass graft appears patent.  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   Electronically Signed   By: Inge Rise M.D.   On: 09/15/2013 15:16    Anti-infectives: Anti-infectives   Start     Dose/Rate Route Frequency Ordered Stop   09/15/13 2130  ciprofloxacin (CIPRO) IVPB 400 mg     400 mg 200 mL/hr over 60 Minutes Intravenous Every 12 hours 09/15/13 2120     09/15/13 1815  ciprofloxacin (CIPRO) IVPB 400 mg     400 mg 200 mL/hr over 60 Minutes Intravenous  Once 09/15/13 1812 09/15/13 2110       Assessment/Plan  1. Acute cholecystitis 2.  Coronary artery disease status post stent placement 1998 with Plavix being held 3. COPD 4. Hypertension  Plan: 1. We will leave her n.p.o. and tried to proceed with a laparoscopic cholecystectomy today. This has been discussed with the patient. She is agreeable. Continue IV antibiotic therapy.   LOS: 1 day    Zaccary Creech E 09/16/2013, 8:32 AM Pager: [phone]

## 2013-09-16 NOTE — Progress Notes (Signed)
Patient ID: Wendy [name], female   DOB: 06-03-1950, 62 y.o.   MRN: [id number]  Patient still with a lot of epigastric and RUQ abdominal pain and tenderness from cholecystitis Needs a lap chole.  I discussed the procedure in detail.   We discussed the risks and benefits of a laparoscopic cholecystectomy and possible cholangiogram including, but not limited to bleeding, infection, injury to surrounding structures such as the intestine or liver, bile leak, retained gallstones, need to convert to an open procedure, prolonged diarrhea, blood clots such as  DVT, common bile duct injury, anesthesia risks, and possible need for additional procedures.  She understands and agrees to proceed.

## 2013-09-16 NOTE — ED Provider Notes (Signed)
Medical screening examination/treatment/procedure(s) were performed by non-physician practitioner and as supervising physician I was immediately available for consultation/collaboration.   EKG Interpretation   Date/Time:  Sunday September 15 2013 11:20:10 EDT Ventricular Rate:  70 PR Interval:  115 QRS Duration: 85 QT Interval:  410 QTC Calculation: 442 R Axis:   58 Text Interpretation:  Sinus rhythm Borderline short PR interval Borderline  repolarization abnormality Confirmed by Beau Fanny  MD, Apryl Brymer ([id number]) on  09/15/2013 12:07:18 PM       Veryl Speak, MD 09/16/13 2251

## 2013-09-16 NOTE — Progress Notes (Addendum)
See previous note

## 2013-09-16 NOTE — Op Note (Signed)
LAPAROSCOPIC CHOLECYSTECTOMY CONVERTED TO OPEN CHOLECYSTECTOMY WITH CHOLANGIOGRAM  Procedure Note  ARIONA DESCHENE 09/15/2013 - 09/16/2013   Pre-op Diagnosis: cholelithiasis     Post-op Diagnosis: acute cholecystitis with cholelithiasis  Procedure(s): LAPAROSCOPIC CHOLECYSTECTOMY CONVERTED TO OPEN CHOLECYSTECTOMY WITH CHOLANGIOGRAM  Surgeon(s): [name], MD  Anesthesia: General  Staff:  Circulator: Starla Link, RN Radiology Technologist: [place] Relief Scrub: Cindie Laroche Scrub Person: Nicki [place], CST; Shawnie Dapper, RN; Dory Peru, RN  Estimated Blood Loss: 200 mL               Specimens: sent to path  Findings:  Hydrops of the gallbladder with gallbladder abscess as well.  Converted to open secondary to difficulty with the cystic duct.  Possible stones and questionable stricturing of the CBD on C-gram          Aliscia Clayton A   Date: 09/16/2013  Time: 7:25 PM

## 2013-09-16 NOTE — Anesthesia Preprocedure Evaluation (Addendum)
Anesthesia Evaluation  Patient identified by MRN, date of birth, ID band Patient awake    Reviewed: Allergy & Precautions, H&P , NPO status , Patient's Chart, lab work & pertinent test results, reviewed documented beta blocker date and time   Airway Mallampati: II TM Distance: >3 FB Neck ROM: Full    Dental  (+) Teeth Intact, Dental Advisory Given   Pulmonary sleep apnea , COPD COPD inhaler, former smoker,  breath sounds clear to auscultation  + decreased breath sounds      Cardiovascular hypertension, Pt. on medications and Pt. on home beta blockers + angina + CAD, + Past MI and + Peripheral Vascular Disease Rhythm:Regular Rate:Normal     Neuro/Psych    GI/Hepatic Neg liver ROS, GERD-  Medicated,  Endo/Other  negative endocrine ROS  Renal/GU negative Renal ROS     Musculoskeletal   Abdominal (+) + obese,   Peds  Hematology   Anesthesia Other Findings   Reproductive/Obstetrics                         Anesthesia Physical Anesthesia Plan  ASA: III  Anesthesia Plan: General   Post-op Pain Management:    Induction: Intravenous  Airway Management Planned: Oral ETT  Additional Equipment:   Intra-op Plan:   Post-operative Plan: Extubation in OR  Informed Consent: I have reviewed the patients History and Physical, chart, labs and discussed the procedure including the risks, benefits and alternatives for the proposed anesthesia with the patient or authorized representative who has indicated his/her understanding and acceptance.   Dental advisory given  Plan Discussed with: Surgeon and CRNA  Anesthesia Plan Comments:        Anesthesia Quick Evaluation

## 2013-09-17 ENCOUNTER — Encounter (HOSPITAL_COMMUNITY): Payer: Self-pay | Admitting: General Practice

## 2013-09-17 DIAGNOSIS — R945 Abnormal results of liver function studies: Secondary | ICD-10-CM | POA: Diagnosis present

## 2013-09-17 LAB — CBC
HCT: 31 % — ABNORMAL LOW (ref 36.0–46.0)
Hemoglobin: 9.8 g/dL — ABNORMAL LOW (ref 12.0–15.0)
MCH: 29.6 pg (ref 26.0–34.0)
MCHC: 31.6 g/dL (ref 30.0–36.0)
MCV: 93.7 fL (ref 78.0–100.0)
Platelets: 251 10*3/uL (ref 150–400)
RBC: 3.31 MIL/uL — ABNORMAL LOW (ref 3.87–5.11)
RDW: 13.9 % (ref 11.5–15.5)
WBC: 11.5 10*3/uL — ABNORMAL HIGH (ref 4.0–10.5)

## 2013-09-17 LAB — COMPREHENSIVE METABOLIC PANEL
ALT: 138 U/L — ABNORMAL HIGH (ref 0–35)
AST: 176 U/L — ABNORMAL HIGH (ref 0–37)
Albumin: 2.4 g/dL — ABNORMAL LOW (ref 3.5–5.2)
Alkaline Phosphatase: 87 U/L (ref 39–117)
Anion gap: 16 — ABNORMAL HIGH (ref 5–15)
BUN: 15 mg/dL (ref 6–23)
CO2: 23 mEq/L (ref 19–32)
Calcium: 8.4 mg/dL (ref 8.4–10.5)
Chloride: 96 mEq/L (ref 96–112)
Creatinine, Ser: 0.95 mg/dL (ref 0.50–1.10)
GFR calc Af Amer: 73 mL/min — ABNORMAL LOW (ref >90)
GFR calc non Af Amer: 63 mL/min — ABNORMAL LOW (ref >90)
Glucose, Bld: 121 mg/dL — ABNORMAL HIGH (ref 70–99)
Potassium: 4.3 mEq/L (ref 3.7–5.3)
Sodium: 135 mEq/L — ABNORMAL LOW (ref 137–147)
Total Bilirubin: 0.8 mg/dL (ref 0.3–1.2)
Total Protein: 6.3 g/dL (ref 6.0–8.3)

## 2013-09-17 MED ORDER — METOPROLOL TARTRATE 100 MG PO TABS
100.0000 mg | ORAL_TABLET | Freq: Two times a day (BID) | ORAL | Status: DC
  Administered 2013-09-17 – 2013-09-21 (×3): 100 mg via ORAL
  Filled 2013-09-17 (×9): qty 1

## 2013-09-17 MED ORDER — LORAZEPAM 2 MG/ML IJ SOLN
1.0000 mg | Freq: Four times a day (QID) | INTRAMUSCULAR | Status: DC | PRN
  Administered 2013-09-17 – 2013-09-19 (×3): 1 mg via INTRAVENOUS
  Filled 2013-09-17 (×3): qty 1

## 2013-09-17 MED ORDER — SODIUM CHLORIDE 0.9 % IV BOLUS (SEPSIS)
500.0000 mL | Freq: Once | INTRAVENOUS | Status: AC
  Administered 2013-09-17: 500 mL via INTRAVENOUS

## 2013-09-17 MED ORDER — LORAZEPAM BOLUS VIA INFUSION
1.0000 mg | Freq: Four times a day (QID) | INTRAVENOUS | Status: DC | PRN
  Filled 2013-09-17: qty 1

## 2013-09-17 NOTE — Evaluation (Signed)
Physical Therapy Evaluation Patient Details Name: Wendy [name] MRN: [id number] DOB: [birth year] Today's Date: 09/17/2013   History of Present Illness  Admitted with abdominal pain, N/V found to be due to gallbladder, s/p Open chole, now scheduled for ERCP for removal of lodged stone.  Clinical Impression  Pt admitted with/for abdominal pain, s/p Open chole.  Pt currently limited functionally due to the problems listed below.  (see problems list.)  Pt will benefit from PT to maximize function and safety to be able to get home safely with available assist of family.     Follow Up Recommendations No PT follow up;Supervision for mobility/OOB    Equipment Recommendations  Other (comment) (TBA)    Recommendations for Other Services       Precautions / Restrictions Restrictions Weight Bearing Restrictions: No      Mobility  Bed Mobility Overal bed mobility: Needs Assistance Bed Mobility: Supine to Sit     Supine to sit: Min assist     General bed mobility comments: up via L elbow and truncal assist due to abdominal pain.  Transfers Overall transfer level: Needs assistance Equipment used:  (iv pole) Transfers: Sit to/from Stand Sit to Stand: Min guard         General transfer comment: reinforced hand placement for safety  Ambulation/Gait Ambulation/Gait assistance: Min guard Ambulation Distance (Feet): 130 Feet Assistive device:  (pushing IV pole) Gait Pattern/deviations: Step-through pattern     General Gait Details: steady, but guarded  Stairs            Wheelchair Mobility    Modified Rankin (Stroke Patients Only)       Balance Overall balance assessment: Needs assistance Sitting-balance support: No upper extremity supported Sitting balance-Leahy Scale: Fair     Standing balance support: No upper extremity supported Standing balance-Leahy Scale: Fair                               Pertinent Vitals/Pain 8/10 with pain meds at  incision site     Home Living Family/patient expects to be discharged to:: Private residence Living Arrangements: Other (Comment) (lives with sister, bro- in-law and 2 children) Available Help at Discharge: Family;Available 24 hours/day Type of Home: Mobile home Home Access: Stairs to enter Entrance Stairs-Rails: None Entrance Stairs-Number of Steps: 2 Home Layout: One level Home Equipment: Shower seat      Prior Function Level of Independence: Independent               Hand Dominance   Dominant Hand: Right    Extremity/Trunk Assessment   Upper Extremity Assessment:  (not test formally, uses functionally)           Lower Extremity Assessment: Overall WFL for tasks assessed      Cervical / Trunk Assessment: Normal  Communication   Communication: No difficulties  Cognition Arousal/Alertness: Awake/alert Behavior During Therapy: WFL for tasks assessed/performed Overall Cognitive Status: Within Functional Limits for tasks assessed                      General Comments      Exercises        Assessment/Plan    PT Assessment Patient needs continued PT services  PT Diagnosis Acute pain   PT Problem List Decreased activity tolerance;Decreased mobility;Decreased knowledge of precautions;Cardiopulmonary status limiting activity;Pain  PT Treatment Interventions Gait training;Stair training;Functional mobility training;Therapeutic activities;Patient/family education   PT Goals (Current goals  can be found in the Care Plan section) Acute Rehab PT Goals Patient Stated Goal: Independent at home PT Goal Formulation: With patient Time For Goal Achievement: 09/24/13 Potential to Achieve Goals: Good    Frequency Min 3X/week   Barriers to discharge        Co-evaluation               End of Session   Activity Tolerance: Patient tolerated treatment well Patient left: in chair;with call bell/phone within reach Nurse Communication: Mobility  status    Functional Assessment Tool Used: clinical judgement, assist used Functional Limitation: Mobility: Walking and moving around Mobility: Walking and Moving Around Current Status ([id number]): At least 1 percent but less than 20 percent impaired, limited or restricted Mobility: Walking and Moving Around Goal Status [phone]): At least 1 percent but less than 20 percent impaired, limited or restricted    Time: 1041-1108 PT Time Calculation (min): 27 min   Charges:   PT Evaluation $Initial PT Evaluation Tier I: 1 Procedure PT Treatments $Gait Training: 8-22 mins   PT G Codes:   Functional Assessment Tool Used: clinical judgement, assist used Functional Limitation: Mobility: Walking and moving around    Shamina Etheridge, Tessie Fass 09/17/2013, 11:22 AM  09/17/2013  Donnella Sham, [place] [phone]  (pager)

## 2013-09-17 NOTE — Anesthesia Postprocedure Evaluation (Signed)
  Anesthesia Post-op Note  Patient: Wendy [name]  Procedure(s) Performed: Procedure(s): LAPAROSCOPIC CHOLECYSTECTOMY CONVERTED TO OPEN CHOLECYSTECTOMY WITH CHOLANGIOGRAM (N/A)  Patient Location: PACU  Anesthesia Type:General  Level of Consciousness: awake and alert   Airway and Oxygen Therapy: Patient Spontanous Breathing and Patient connected to nasal cannula oxygen  Post-op Pain: mild  Post-op Assessment: Post-op Vital signs reviewed, Patient's Cardiovascular Status Stable, Respiratory Function Stable, Patent Airway, No signs of Nausea or vomiting and Pain level controlled  Post-op Vital Signs: Reviewed and stable  Last Vitals:  Filed Vitals:   09/17/13 0557  BP: 109/48  Pulse: 65  Temp: 36.3 C  Resp: 14    Complications: No apparent anesthesia complications

## 2013-09-17 NOTE — Consult Note (Signed)
HPI :   Patient is a 63 year old female with multiple medical problems admitted with epigastric pain. Ultrasound revealed gallbladder wall thickening and sludge. Patient was taken for lap chole but was converted to open. There were questionable bile duct stones and ? stricture found. Patient having non-radiating RUQ pain this am.  Transaminases with significant rise overnight. No cholestasis .  Past Medical History  Diagnosis Date  . Glucose intolerance (impaired glucose tolerance)   . Hypertension   . PAD (peripheral artery disease)   . COPD (chronic obstructive pulmonary disease)   . Peripheral neuropathy   . Chronic back pain   . Chronic leg pain   . CAD (coronary artery disease), native coronary artery     Hx CABG 1998, last PCI 2013; LHC 07/2013:  EF 55-60%, L-LAD prob atretic, no sig disease in LAD, S-OM1 ok with patent stent, S-dRCA ok => med Rx.  Marland Kitchen HLD (hyperlipidemia)   . Myocardial infarction   . Sleep apnea   . Shortness of breath     when walking  . Anxiety   . GERD (gastroesophageal reflux disease)     Family History  Problem Relation Age of Onset  . Lung cancer Mother   No colon cancer in family  History  Substance Use Topics  . Smoking status: Former Smoker    Quit date: 07/20/1998  . Smokeless tobacco: Never Used  . Alcohol Use: No   Current Facility-Administered Medications  Medication Dose Route Frequency Provider Last Rate Last Dose  . acetaminophen (TYLENOL) tablet 650 mg  650 mg Oral Q6H PRN Bynum Bellows, MD       Or  . acetaminophen (TYLENOL) suppository 650 mg  650 mg Rectal Q6H PRN Bynum Bellows, MD      . albuterol (PROVENTIL) (2.5 MG/3ML) 0.083% nebulizer solution 3 mL  3 mL Inhalation Q6H PRN Bynum Bellows, MD      . ALPRAZolam Duanne Moron) tablet 0.5 mg  0.5 mg Oral TID PRN Bynum Bellows, MD      . aspirin EC tablet 81 mg  81 mg Oral Daily Bynum Bellows, MD   81 mg at 09/16/13 1039  . ciprofloxacin (CIPRO) IVPB 400 mg  400 mg Intravenous  Q12H Bynum Bellows, MD   400 mg at 09/16/13 2202  . enoxaparin (LOVENOX) injection 40 mg  40 mg Subcutaneous Q24H Bynum Bellows, MD   40 mg at 09/16/13 2203  . ezetimibe (ZETIA) tablet 10 mg  10 mg Oral Daily Bynum Bellows, MD   10 mg at 09/16/13 1039  . fluticasone (FLOVENT HFA) 44 MCG/ACT inhaler 1 puff  1 puff Inhalation BID Bynum Bellows, MD   1 puff at 09/16/13 1203  . HYDROmorphone (DILAUDID) injection 1 mg  1 mg Intravenous Q2H PRN Dianne Dun, NP   1 mg at 09/16/13 1049  . HYDROmorphone (DILAUDID) PCA injection 0.3 mg/mL   Intravenous 6 times per day [name], MD   3.6 mg at 09/17/13 0400  . loratadine (CLARITIN) tablet 10 mg  10 mg Oral Daily Bynum Bellows, MD   10 mg at 09/16/13 1039  . methocarbamol (ROBAXIN) tablet 500-1,000 mg  500-1,000 mg Oral Q6H PRN Bynum Bellows, MD   500 mg at 09/16/13 1048  . metoprolol (LOPRESSOR) tablet 100 mg  100 mg Oral BID Jonetta Osgood, MD      . mometasone-formoterol [facility]) 100-5 MCG/ACT inhaler 2 puff  2  puff Inhalation BID Bynum Bellows, MD   2 puff at 09/16/13 1202  . naloxone [facility]) injection 0.4 mg  0.4 mg Intravenous PRN [name], MD       And  . sodium chloride 0.9 % injection 9 mL  9 mL Intravenous PRN [name], MD      . ondansetron [facility]) tablet 4 mg  4 mg Oral Q6H PRN Bynum Bellows, MD       Or  . ondansetron (ZOFRAN) injection 4 mg  4 mg Intravenous Q6H PRN Bynum Bellows, MD      . ondansetron [facility]) injection 4 mg  4 mg Intravenous Q6H PRN [name], MD      . pantoprazole (PROTONIX) EC tablet 80 mg  80 mg Oral BID Bynum Bellows, MD   80 mg at 09/16/13 2202  . simvastatin (ZOCOR) tablet 20 mg  20 mg Oral q1800 Bynum Bellows, MD   20 mg at 09/15/13 2249  . tiotropium (SPIRIVA) inhalation capsule 18 mcg  18 mcg Inhalation Daily Bynum Bellows, MD   18 mcg at 09/16/13 1205  . triamterene-hydrochlorothiazide (DYAZIDE) 37.5-25 MG per capsule 1 capsule  1 capsule Oral  Daily Jonetta Osgood, MD   1 capsule at 09/16/13 1039   Allergies  Allergen Reactions  . Benadryl [Diphenhydramine] Shortness Of Breath  . Adhesive [Tape]     Burning of skin  . Amoxicillin Nausea And Vomiting  . Flexeril [Cyclobenzaprine] Other (See Comments)    States that it messes with her blood circulation; pt states that she has to walk around   Review of Systems: All systems reviewed and negative except where noted in HPI.    US Abdomen Complete  09/15/2013   CLINICAL DATA:  Abdominal pain  EXAM: ULTRASOUND ABDOMEN COMPLETE  COMPARISON:  CT abdomen and pelvis September 15, 2013  FINDINGS: Gallbladder:  Sludge is seen within the gallbladder. There are no echogenic foci which move and shadowing consistent with gallstones. The gallbladder wall is thickened at 5 mm in thickness. There is no gallbladder wall edema or pericholecystic fluid. No sonographic Murphy sign noted.  Common bile duct:  Diameter: 6 mm. There is no intrahepatic, common hepatic, common bile duct dilatation.  Liver:  No focal lesion identified. Liver echogenicity is diffusely increased.  IVC:  No abnormality visualized.  Pancreas:  Visualized portion unremarkable. Portions of the pancreas were obscured by gas.  Spleen:  Size and appearance within normal limits.  Right Kidney:  Length: 10.2 cm. Echogenicity within normal limits. No mass or hydronephrosis visualized.  Left Kidney:  Length: 10.8 cm. Echogenicity within normal limits. No mass or hydronephrosis visualized. There is scarring in the lower pole region  Abdominal aorta:  No aneurysm visualized.  Portions of the aorta are obscured by gas.  Other findings:  No demonstrable ascites.  IMPRESSION: Gallbladder wall is thickened. There is no gallbladder wall edema or pericholecystic fluid, however. There is sludge in the gallbladder but no appreciable gallstones. The wall thickening could indicate a degree of acalculus cholecystitis.  Portions of the pancreas and abdominal aorta  are obscured by gas. Visualized portions of these structures appear normal.  Increased echogenicity liver consistent with fatty change. While no focal liver lesions are identified, it must be cautioned that the sensitivity ofultrasound for focal liver lesions is diminished in this circumstance.   Electronically Signed   By: Lowella Grip M.D.   On: 09/15/2013 17:13   Ct Abdomen  Pelvis W Contrast  09/15/2013   CLINICAL DATA:  Intermittent mid and upper abdominal pain beginning 1 week ago.  EXAM: CT ABDOMEN AND PELVIS WITH CONTRAST  TECHNIQUE: Multidetector CT imaging of the abdomen and pelvis was performed using the standard protocol following bolus administration of intravenous contrast.  CONTRAST:  100 mL OMNIPAQUE IOHEXOL 300 MG/ML  SOLN  COMPARISON:  Aortic ultrasound 06/05/2013.  FINDINGS: Mild dependent atelectasis is seen in the lung bases. No pleural or pericardial effusion.  The liver is diffusely low attenuating consistent with fatty infiltration. No focal liver lesion is seen. The gallbladder is mildly dilated and there is some stranding about the gallbladder. The pancreas, adrenal glands, spleen and kidneys appear normal.  Fem-fem bypass graft is identified and opacifies with contrast material. There is aortoiliac atherosclerosis without aneurysm. 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 Left common iliac artery also may be occluded with reconstitution of the internal and external iliacs.  The stomach, small bowel and appendix appear normal. No lymphadenopathy is identified.  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 No femoral head fragmentation or collapse is identified. Lumbar spondylosis is noted.  IMPRESSION: Dilated gallbladder with pericholecystic stranding worrisome for cholecystitis. Right upper quadrant ultrasound would be useful further evaluation.  Diffuse fatty infiltration of the liver.  Extensive vascular disease as detailed  above. Fem-fem bypass graft appears patent.  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   Electronically Signed   By: Inge Rise M.D.   On: 09/15/2013 15:16   Physical Exam: BP 109/48  Pulse 65  Temp(Src) 97.3 F (36.3 C) (Oral)  Resp 14  Ht 5\' 2"  (1.575 m)  Wt 196 lb 6.9 oz (89.1 kg)  BMI 35.92 kg/m2  SpO2 97% Constitutional: Pleasant, obese white female in no acute distress. HEENT: Normocephalic and atraumatic. Conjunctivae are normal. No scleral icterus. Neck supple.  Cardiovascular: Normal rate, regular rhythm.  Pulmonary/chest: Effort normal and breath sounds normal. No wheezing, rales or rhonchi. Abdominal: Soft, obese. Large surgical dressing RUQ. Hypoactive bowel sounds  Extremities: no edema Lymphadenopathy: No cervical adenopathy noted. Neurological: Alert and oriented to person place and time. Skin: Skin is warm and dry. No rashes noted. Psychiatric: Normal mood and affect. Behavior is normal.   ASSESSMENT AND PLAN:  58. 63 year old female with acute cholecystitis, s/p cholecystectomy. Procedure converted to open secondary to difficulty with the cystic duct. Possible stones and possbile stricturing of the CBD on cholangiogram. She will need ERCP with possible stone extraction. The risks /potential complications of the procedure were explained to the patient and she agrees to proceed. Already on Cipro. Last Lovenox was last night.   2. Multiple medical problems, including but not limited to CAD / COPD / morbid obesity / HTN / GERD  GI Attending Note   Chart was reviewed and patient was examined. X-rays and lab were reviewed.    I agree with management and plans.  I a suspicious that she has retained stones, especially in view of elevation of her liver tests.  Plan ERCP tomorrow.  Sandy Salaam. Deatra Ina, M.D., [facility] Cell [phone]

## 2013-09-17 NOTE — Progress Notes (Signed)
I have seen and examined the patient and agree with the assessment and plans. Continuing iv antibiotics. Will let GI review c-gram to see if they think an ERCP is needed.  Kennita Pavlovich A. [name]  MD, FACS

## 2013-09-17 NOTE — Progress Notes (Signed)
Pt has refused cpap again for tonight, stating she has enough on at this time.  Cpap still in room if needed, and pt knows to contact RN if she'd like to go on it.

## 2013-09-17 NOTE — Progress Notes (Signed)
Patient ID: Wendy [name], female   DOB: Oct 01, 1950, 63 y.o.   MRN: [id number] 1 Day Post-Op  Subjective: Patient complains of some soreness this morning. She is not nauseated. Her pain is well controlled.  Objective: Vital signs in last 24 hours: Temp:  [97.3 F (36.3 C)-98.9 F (37.2 C)] 97.3 F (36.3 C) (07/14 0557) Pulse Rate:  [65-105] 65 (07/14 0557) Resp:  [12-29] 14 (07/14 0557) BP: (96-117)/(45-88) 109/48 mmHg (07/14 0557) SpO2:  [93 %-100 %] 97 % (07/14 0557) Weight:  [196 lb 6.9 oz (89.1 kg)] 196 lb 6.9 oz (89.1 kg) (07/14 0557) Last BM Date: 09/15/13  Intake/Output from previous day: 07/13 0701 - 07/14 0700 In: 1900 [I.V.:1700; IV Piggyback:200] Out: 875 [Urine:750; Drains:25; Blood:100] Intake/Output this shift: Total I/O In: -  Out: 250 [Urine:250]  PE: Abd: Soft, appropriately tender, incision is covered and dressings are clean, dry, and intact. She has active bowel sounds. Her JP drain is serous sanguinous. Heart: Regular Lungs: Clear to auscultation bilaterally  Lab Results:   Recent Labs  09/16/13 0504 09/17/13 0105  WBC 11.8* 11.5*  HGB 11.2* 9.8*  HCT 35.2* 31.0*  PLT 236 251   BMET  Recent Labs  09/16/13 0504 09/17/13 0105  NA 138 135*  K 3.7 4.3  CL 98 96  CO2 25 23  GLUCOSE 116* 121*  BUN 13 15  CREATININE 0.69 0.95  CALCIUM 8.7 8.4   PT/INR No results found for this basename: LABPROT, INR,  in the last 72 hours CMP     Component Value Date/Time   NA 135* 09/17/2013 0105   K 4.3 09/17/2013 0105   CL 96 09/17/2013 0105   CO2 23 09/17/2013 0105   GLUCOSE 121* 09/17/2013 0105   BUN 15 09/17/2013 0105   CREATININE 0.95 09/17/2013 0105   CALCIUM 8.4 09/17/2013 0105   PROT 6.3 09/17/2013 0105   ALBUMIN 2.4* 09/17/2013 0105   AST 176* 09/17/2013 0105   ALT 138* 09/17/2013 0105   ALKPHOS 87 09/17/2013 0105   BILITOT 0.8 09/17/2013 0105   GFRNONAA 63* 09/17/2013 0105   GFRAA 73* 09/17/2013 0105   Lipase     Component Value Date/Time    LIPASE 9* 09/15/2013 1302       Studies/Results: Dg Cholangiogram Operative  09/17/2013   CLINICAL DATA:  Cholecystectomy for cholecystitis.  EXAM: INTRAOPERATIVE CHOLANGIOGRAM  TECHNIQUE: Cholangiographic images from the C-arm fluoroscopic device were submitted for interpretation post-operatively. Please see the procedural report for the amount of contrast and the fluoroscopy time utilized.  COMPARISON:  None.  FINDINGS: Filling defects introduced into the common bile duct are likely related air bubbles. There is no evidence of biliary obstruction with contrast entering the duodenum. Visualized intrahepatic ducts are unremarkable.  IMPRESSION: Filling defects visualized in the CBD likely relate to air bubbles.   Electronically Signed   By: Aletta Edouard M.D.   On: 09/17/2013 08:09   US Abdomen Complete  09/15/2013   CLINICAL DATA:  Abdominal pain  EXAM: ULTRASOUND ABDOMEN COMPLETE  COMPARISON:  CT abdomen and pelvis September 15, 2013  FINDINGS: Gallbladder:  Sludge is seen within the gallbladder. There are no echogenic foci which move and shadowing consistent with gallstones. The gallbladder wall is thickened at 5 mm in thickness. There is no gallbladder wall edema or pericholecystic fluid. No sonographic Murphy sign noted.  Common bile duct:  Diameter: 6 mm. There is no intrahepatic, common hepatic, common bile duct dilatation.  Liver:  No focal lesion identified.  Liver echogenicity is diffusely increased.  IVC:  No abnormality visualized.  Pancreas:  Visualized portion unremarkable. Portions of the pancreas were obscured by gas.  Spleen:  Size and appearance within normal limits.  Right Kidney:  Length: 10.2 cm. Echogenicity within normal limits. No mass or hydronephrosis visualized.  Left Kidney:  Length: 10.8 cm. Echogenicity within normal limits. No mass or hydronephrosis visualized. There is scarring in the lower pole region  Abdominal aorta:  No aneurysm visualized.  Portions of the aorta are  obscured by gas.  Other findings:  No demonstrable ascites.  IMPRESSION: Gallbladder wall is thickened. There is no gallbladder wall edema or pericholecystic fluid, however. There is sludge in the gallbladder but no appreciable gallstones. The wall thickening could indicate a degree of acalculus cholecystitis.  Portions of the pancreas and abdominal aorta are obscured by gas. Visualized portions of these structures appear normal.  Increased echogenicity liver consistent with fatty change. While no focal liver lesions are identified, it must be cautioned that the sensitivity ofultrasound for focal liver lesions is diminished in this circumstance.   Electronically Signed   By: Lowella Grip M.D.   On: 09/15/2013 17:13   Ct Abdomen Pelvis W Contrast  09/15/2013   CLINICAL DATA:  Intermittent mid and upper abdominal pain beginning 1 week ago.  EXAM: CT ABDOMEN AND PELVIS WITH CONTRAST  TECHNIQUE: Multidetector CT imaging of the abdomen and pelvis was performed using the standard protocol following bolus administration of intravenous contrast.  CONTRAST:  100 mL OMNIPAQUE IOHEXOL 300 MG/ML  SOLN  COMPARISON:  Aortic ultrasound 06/05/2013.  FINDINGS: Mild dependent atelectasis is seen in the lung bases. No pleural or pericardial effusion.  The liver is diffusely low attenuating consistent with fatty infiltration. No focal liver lesion is seen. The gallbladder is mildly dilated and there is some stranding about the gallbladder. The pancreas, adrenal glands, spleen and kidneys appear normal.  Fem-fem bypass graft is identified and opacifies with contrast material. There is aortoiliac atherosclerosis without aneurysm. 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 Left common iliac artery also may be occluded with reconstitution of the internal and external iliacs.  The stomach, small bowel and appendix appear normal. No lymphadenopathy is identified.  Bones demonstrate avascular necrosis of the  femoral heads, worse on the right. No femoral head fragmentation or collapse is identified. Lumbar spondylosis is noted.  IMPRESSION: Dilated gallbladder with pericholecystic stranding worrisome for cholecystitis. Right upper quadrant ultrasound would be useful further evaluation.  Diffuse fatty infiltration of the liver.  Extensive vascular disease as detailed above. Fem-fem bypass graft appears patent.  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   Electronically Signed   By: Inge Rise M.D.   On: 09/15/2013 15:16    Anti-infectives: Anti-infectives   Start     Dose/Rate Route Frequency Ordered Stop   09/15/13 2130  ciprofloxacin (CIPRO) IVPB 400 mg     400 mg 200 mL/hr over 60 Minutes Intravenous Every 12 hours 09/15/13 2120     09/15/13 1815  ciprofloxacin (CIPRO) IVPB 400 mg     400 mg 200 mL/hr over 60 Minutes Intravenous  Once 09/15/13 1812 09/15/13 2110       Assessment/Plan  1. Status post laparoscopic converted to open cholecystectomy 2. Choledocholithiasis  Plan: 1. We'll keep n.p.o. until after her GI evaluation today for possible ERCP. She may have clear liquids from our standpoint after her procedure or if GI does  not choose to proceed she may have clear liquids today. 2. Continue PCA for pain control 3. Mobilize and pulmonary toilet 4. Check cmet and cbc in a.m.   LOS: 2 days    Jerelyn Trimarco E 09/17/2013, 8:28 AM Pager: [phone]

## 2013-09-17 NOTE — Op Note (Signed)
Wendy [name], Wendy [name]              ACCOUNT NO.:  [id number]  MEDICAL RECORD NO.:  [id number]  LOCATION:  6N22C                        FACILITY:  [facility]  PHYSICIAN:  Coralie Keens, M.D. DATE OF BIRTH:  01-23-1951  DATE OF PROCEDURE:  09/16/2013 DATE OF DISCHARGE:                              OPERATIVE REPORT   PREOPERATIVE DIAGNOSIS:  Symptomatic cholelithiasis.  POSTOPERATIVE DIAGNOSIS:  Acute cholecystitis with cholelithiasis.  PROCEDURE:  Laparoscopic converted to open cholecystectomy with cholangiogram.  SURGEON:  Coralie Keens, M.D.  ANESTHESIA:  General endotracheal anesthesia.  ESTIMATED BLOOD LOSS:  200 mL.  INDICATIONS:  This is a 63 year old female who presented with abdominal pain.  She was found on ultrasound to have findings consistent with cholelithiasis and possible cholecystitis.  Preoperative liver function tests were normal and the bile duct was normal on preoperative ultrasound.  FINDINGS:  The patient was found to have acutely distended gallbladder with hydrops of the gallbladder.  There was also gross purulence in the gallbladder.  As the gallbladder was being elevated, what appeared to be the cystic duct tore.  Cholangiogram was performed.  It was difficult to identify the how much remnant was between the bile duct.  Cholangiogram showed the possibility of stones.  I converted to open procedure. Again, cholangiogram demonstrated the entire bile duct with a possible stone floating in the bile duct and questionable stricturing.  PROCEDURE IN DETAIL:  The patient was brought to the operating room, identified as Wendy [name].  She was placed supine on the operating table and general anesthesia was induced.  Her abdomen was then prepped and draped in usual sterile fashion.  I made a small transverse incision above the umbilicus with scalpel.  I took this down to the fascia, which was then opened with scalpel.  A hemostat was then used to pass into  the peritoneal cavity under direct vision.  A 0 Vicryl pursestring suture was then placed around the fascial opening.  The Hasson port was placed through the opening and insufflation of the abdomen was begun.  A 5-mm port was then placed in the the patient's epigastrium and two more in the right upper quadrant all under direct vision.  Several adhesions to the abdominal wall had to be taken down with laparoscopic scissors.  The gallbladder was then identified and found to be acutely distended.  I grasped the gallbladder and retracted above the liver and had easily tore.  There was clear bile in the gallbladder consistent with hydrops. As I grasped the gallbladder further, I started tearing in the pieces. Gross purulence was found to be in the fundus of the gallbladder.  At this point, as I grasped the gallbladder and retracted it, it started completely tore in pieces.  There was one small remnant that appeared consistent with a possible cystic duct, which was already torn.  I made a small incision in the right upper quadrant, placed an Angiocath and inserted a cholangiocatheter through this.  I then placed this into small remnant.  The cholangiogram was then performed.  It was difficult to tell where was exactly entering the bile duct, but I could see contrast going both proximally and distally.  There were questionable stones  in the bile duct right at the area of the cystic duct.  The contrast flowed easily into a tapering at the duodenum.  There was questionable stricturing in the bile duct above the cystic duct.  At this point as the anatomy was not quite clear, I decided to convert to an open procedure.  I removed all ports and deflated the abdomen.  I then made a right upper quadrant incision incorporating the two port sites.  I took this down through the muscle layers and fascia with the electrocautery.  I then excised the remaining gallbladder and was able to identify the cystic  artery, which I clipped with surgical clips.  The gallbladder was sent to Pathology for evaluation.  I then found the small remnant what appeared to potentially be the cystic duct.  I again placed a cholangiocatheter through this.  I was able to do another cholangiogram, which again showed contrast flowing into the entire biliary system.  There was still a question of stricturing in the common hepatic duct and stone, and a stone in the bile duct.  Again, there was no obvious obstruction going into the duodenum.  At this point, I removed the cholangiocatheter.  The small opening what appeared to be a cystic duct remnant I closed with two separate figure-of-eight 3-0 PDS sutures.  I then thoroughly irrigated the abdomen with normal saline.  I placed two pieces of snow in the gallbladder fossa to achieve hemostasis.  Again, hemostasis appeared to be achieved.  I then closed the patient's posterior fascia with a running #1 PDS suture.  I then closed the anterior fascia with a #1 looped PDS suture.  I irrigated the incision and anesthetized them all with Marcaine, I then closed all skin incisions with staples.  The drain was sewn in place with a nylon suture.  The drain was then placed to bulb suction.  The patient tolerated the procedure well.  All the counts were correct at the end of procedure.  The patient was then extubated in the operating room and taken in stable condition to the recovery room.     Coralie Keens, M.D.     DB/MEDQ  D:  09/16/2013  T:  09/17/2013  Job:  [id number]

## 2013-09-17 NOTE — Progress Notes (Signed)
Pt has refused cpap again tonight because she is hot.  Pt says she wears it at home religiously, so cpap was left in room in case she changes her mind.  Pt knows to call RN for RT assistance, if needed.  RT will continue to monitor.

## 2013-09-17 NOTE — Progress Notes (Addendum)
PATIENT DETAILS Name: Wendy [name] Age: 63 y.o. Sex: female Date of Birth: 03/25/1950 Admit Date: 09/15/2013 Admitting Physician Bynum Bellows, MD [id number], Marye Round, PA-C  Subjective: Still complains of RUQ pain-but now is POD#1 Open cholecystectomy  Assessment/Plan: Principal Problem:   Cholecystitis, acute -S/p laparoscopic converted to open cholecystectomy on 7/13, cholangiogram suggestive of CBD stone. Will d/w CCS to see if Cipro can be stopped. -Diet NPO for now pending GI evaluation for possible ERCP -c/w PCA for pain control, Gen surgery managing post op issues.  Active Problems: Suspected Choledocholithiasis -seen on intra-operative cholangiogram-seen by GI-ERCP on 7/15   Hypertension  -Stable.  -c/w Metoprolol and Dyazide   Hyperlipidemia  -Continue statin.   COPD  -Stable at this time. Continue home inhalers.   Obstructive sleep apnea  -Continue CPAP at bedtime.   GERD  -Continue PPI.   Anxiety  -Benzodiazepines as needed. Was having a panic attacked which subsided with deep breathing exercises  Peripheral artery disease  -Stable.   Disposition:  Remain inpatient  DVT Prophylaxis:  Prophylactic Lovenox  Code Status:  Full code  Family Communication  At bedside  Disposition: Remain inpatient for now, pending GI evaluation for possible ERCP.  Diet advanced as tolerated.  Procedures: LAPAROSCOPIC CHOLECYSTECTOMY CONVERTED TO OPEN CHOLECYSTECTOMY WITH CHOLANGIOGRAM  CONSULTS:  GI and general surgery  Time spent 40 minutes-which includes 50% of the time with face-to-face with patient/ family and coordinating care related to the above assessment and plan.   MEDICATIONS: Scheduled Meds: . aspirin EC  81 mg Oral Daily  . ciprofloxacin  400 mg Intravenous Q12H  . enoxaparin (LOVENOX) injection  40 mg Subcutaneous Q24H  . ezetimibe  10 mg Oral Daily  . fluticasone  1 puff Inhalation BID  . HYDROmorphone PCA 0.3 mg/mL    Intravenous 6 times per day  . loratadine  10 mg Oral Daily  . metoprolol  100 mg Oral BID  . mometasone-formoterol  2 puff Inhalation BID  . pantoprazole  80 mg Oral BID  . simvastatin  20 mg Oral q1800  . tiotropium  18 mcg Inhalation Daily  . triamterene-hydrochlorothiazide  1 capsule Oral Daily   Continuous Infusions:  PRN Meds:.acetaminophen, acetaminophen, albuterol, ALPRAZolam, HYDROmorphone (DILAUDID) injection, methocarbamol, naloxone, ondansetron (ZOFRAN) IV, ondansetron (ZOFRAN) IV, ondansetron, sodium chloride  Antibiotics: Anti-infectives   Start     Dose/Rate Route Frequency Ordered Stop   09/15/13 2130  ciprofloxacin (CIPRO) IVPB 400 mg     400 mg 200 mL/hr over 60 Minutes Intravenous Every 12 hours 09/15/13 2120     09/15/13 1815  ciprofloxacin (CIPRO) IVPB 400 mg     400 mg 200 mL/hr over 60 Minutes Intravenous  Once 09/15/13 1812 09/15/13 2110       PHYSICAL EXAM: Vital signs in last 24 hours: Filed Vitals:   09/17/13 0211 09/17/13 0215 09/17/13 0400 09/17/13 0557  BP:  96/56  109/48  Pulse:  66  65  Temp:  97.3 F (36.3 C)  97.3 F (36.3 C)  TempSrc:  Oral  Oral  Resp: 17 18 12 14   Height:      Weight:    89.1 kg (196 lb 6.9 oz)  SpO2:  99% 99% 97%    Weight change: 0.649 kg (1 lb 6.9 oz) Filed Weights   09/15/13 2129 09/16/13 0617 09/17/13 0557  Weight: 88 kg (194 lb 0.1 oz) 88 kg (194 lb 0.1 oz) 89.1 kg (196 lb 6.9 oz)  Body mass index is 35.92 kg/(m^2).   Gen Exam: Awake and alert with clear speech.  Distress due to panic attach which subsided  Neck: Supple, No JVD.   Chest: B/L Clear.   CVS: S1 S2 Regular, no murmurs.  Abdomen: Abd: Soft, appropriately tender, incision is covered and dressings are clean, dry, and intact. She has active bowel sounds. Her JP drain is serous sanguinous. Extremities: no edema, lower extremities warm to touch. Neurologic: Non Focal.   Skin: No Rash.   Wounds: Cholecystectomy incision  Intake/Output from  previous day:  Intake/Output Summary (Last 24 hours) at 09/17/13 0814 Last data filed at 09/17/13 0724  Gross per 24 hour  Intake   1900 ml  Output   1125 ml  Net    775 ml     LAB RESULTS: CBC  Recent Labs Lab 09/15/13 1302 09/16/13 0504 09/17/13 0105  WBC 10.5 11.8* 11.5*  HGB 12.5 11.2* 9.8*  HCT 38.3 35.2* 31.0*  PLT 255 236 251  MCV 91.8 93.9 93.7  MCH 30.0 29.9 29.6  MCHC 32.6 31.8 31.6  RDW 13.9 14.1 13.9  LYMPHSABS 1.1  --   --   MONOABS 0.8  --   --   EOSABS 0.0  --   --   BASOSABS 0.0  --   --     Chemistries   Recent Labs Lab 09/15/13 1302 09/16/13 0504 09/17/13 0105  NA 143 138 135*  K 3.5* 3.7 4.3  CL 99 98 96  CO2 23 25 23   GLUCOSE 111* 116* 121*  BUN 16 13 15   CREATININE 0.69 0.69 0.95  CALCIUM 9.6 8.7 8.4    CBG: No results found for this basename: GLUCAP,  in the last 168 hours  GFR Estimated Creatinine Clearance: 64.5 ml/min (by C-G formula based on Cr of 0.95).  Coagulation profile No results found for this basename: INR, PROTIME,  in the last 168 hours  Cardiac Enzymes No results found for this basename: CK, CKMB, TROPONINI, MYOGLOBIN,  in the last 168 hours  No components found with this basename: POCBNP,  No results found for this basename: DDIMER,  in the last 72 hours No results found for this basename: HGBA1C,  in the last 72 hours No results found for this basename: CHOL, HDL, LDLCALC, TRIG, CHOLHDL, LDLDIRECT,  in the last 72 hours No results found for this basename: TSH, T4TOTAL, FREET3, T3FREE, THYROIDAB,  in the last 72 hours No results found for this basename: VITAMINB12, FOLATE, FERRITIN, TIBC, IRON, RETICCTPCT,  in the last 72 hours  Recent Labs  09/15/13 1302  LIPASE 9*    Urine Studies No results found for this basename: UACOL, UAPR, USPG, UPH, UTP, UGL, UKET, UBIL, UHGB, UNIT, UROB, ULEU, UEPI, UWBC, URBC, UBAC, CAST, CRYS, UCOM, BILUA,  in the last 72 hours  MICROBIOLOGY: No results found for this or  any previous visit (from the past 240 hour(s)).  RADIOLOGY STUDIES/RESULTS: Dg Chest 2 View  08/26/2013   CLINICAL DATA:  Preoperative chest x-ray prior to left humeral repair  EXAM: CHEST  2 VIEW  COMPARISON:  Prior chest x-ray 07/19/2013  FINDINGS: Cardiac and mediastinal contours remain within normal limits. Patient is status post median sternotomy with evidence of multivessel CABG including LIMA bypass. Metallic stents project over the right and left anterior descending coronary arteries. The lungs are clear. No pleural effusion or pneumothorax. Incompletely imaged left mid humeral fracture. No acute osseous abnormality.  IMPRESSION: No active cardiopulmonary disease.  Evidence  of prior percutaneous coronary artery stenting and multivessel CABG suggests a significant past history of coronary artery disease.   Electronically Signed   By: Jacqulynn Cadet M.D.   On: 08/26/2013 14:14   Dg Cholangiogram Operative  09/17/2013   CLINICAL DATA:  Cholecystectomy for cholecystitis.  EXAM: INTRAOPERATIVE CHOLANGIOGRAM  TECHNIQUE: Cholangiographic images from the C-arm fluoroscopic device were submitted for interpretation post-operatively. Please see the procedural report for the amount of contrast and the fluoroscopy time utilized.  COMPARISON:  None.  FINDINGS: Filling defects introduced into the common bile duct are likely related air bubbles. There is no evidence of biliary obstruction with contrast entering the duodenum. Visualized intrahepatic ducts are unremarkable.  IMPRESSION: Filling defects visualized in the CBD likely relate to air bubbles.   Electronically Signed   By: Aletta Edouard M.D.   On: 09/17/2013 08:09   US Abdomen Complete  09/15/2013   CLINICAL DATA:  Abdominal pain  EXAM: ULTRASOUND ABDOMEN COMPLETE  COMPARISON:  CT abdomen and pelvis September 15, 2013  FINDINGS: Gallbladder:  Sludge is seen within the gallbladder. There are no echogenic foci which move and shadowing consistent with  gallstones. The gallbladder wall is thickened at 5 mm in thickness. There is no gallbladder wall edema or pericholecystic fluid. No sonographic Murphy sign noted.  Common bile duct:  Diameter: 6 mm. There is no intrahepatic, common hepatic, common bile duct dilatation.  Liver:  No focal lesion identified. Liver echogenicity is diffusely increased.  IVC:  No abnormality visualized.  Pancreas:  Visualized portion unremarkable. Portions of the pancreas were obscured by gas.  Spleen:  Size and appearance within normal limits.  Right Kidney:  Length: 10.2 cm. Echogenicity within normal limits. No mass or hydronephrosis visualized.  Left Kidney:  Length: 10.8 cm. Echogenicity within normal limits. No mass or hydronephrosis visualized. There is scarring in the lower pole region  Abdominal aorta:  No aneurysm visualized.  Portions of the aorta are obscured by gas.  Other findings:  No demonstrable ascites.  IMPRESSION: Gallbladder wall is thickened. There is no gallbladder wall edema or pericholecystic fluid, however. There is sludge in the gallbladder but no appreciable gallstones. The wall thickening could indicate a degree of acalculus cholecystitis.  Portions of the pancreas and abdominal aorta are obscured by gas. Visualized portions of these structures appear normal.  Increased echogenicity liver consistent with fatty change. While no focal liver lesions are identified, it must be cautioned that the sensitivity ofultrasound for focal liver lesions is diminished in this circumstance.   Electronically Signed   By: Lowella Grip M.D.   On: 09/15/2013 17:13   Ct Abdomen Pelvis W Contrast  09/15/2013   CLINICAL DATA:  Intermittent mid and upper abdominal pain beginning 1 week ago.  EXAM: CT ABDOMEN AND PELVIS WITH CONTRAST  TECHNIQUE: Multidetector CT imaging of the abdomen and pelvis was performed using the standard protocol following bolus administration of intravenous contrast.  CONTRAST:  100 mL OMNIPAQUE  IOHEXOL 300 MG/ML  SOLN  COMPARISON:  Aortic ultrasound 06/05/2013.  FINDINGS: Mild dependent atelectasis is seen in the lung bases. No pleural or pericardial effusion.  The liver is diffusely low attenuating consistent with fatty infiltration. No focal liver lesion is seen. The gallbladder is mildly dilated and there is some stranding about the gallbladder. The pancreas, adrenal glands, spleen and kidneys appear normal.  Fem-fem bypass graft is identified and opacifies with contrast material. There is aortoiliac atherosclerosis without aneurysm. As noted on ultrasound, the right iliac arteries  are occluded. Left common iliac artery also may be occluded with reconstitution of the internal and external iliacs.  The stomach, small bowel and appendix appear normal. No lymphadenopathy is identified.  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 No femoral head fragmentation or collapse is identified. Lumbar spondylosis is noted.  IMPRESSION: Dilated gallbladder with pericholecystic stranding worrisome for cholecystitis. Right upper quadrant ultrasound would be useful further evaluation.  Diffuse fatty infiltration of the liver.  Extensive vascular disease as detailed above. Fem-fem bypass graft appears patent.  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   Electronically Signed   By: Inge Rise M.D.   On: 09/15/2013 15:16   Dg Humerus Left  08/27/2013   CLINICAL DATA:  Postop  EXAM: LEFT HUMERUS - 2+ VIEW  COMPARISON:  Intraoperative C-arm spot films  FINDINGS: Plate and screw fixation of the comminuted fracture of the mid proximal left humerus is noted. The fixation plate is in good position. No complicating features are seen.  IMPRESSION: Fixation of mid proximal comminuted left humeral fracture.   Electronically Signed   By: Ivar Drape M.D.   On: 08/27/2013 15:27   Dg Humerus Left  08/27/2013   CLINICAL DATA:  Humerus  fracture.  EXAM: LEFT HUMERUS - 2+ VIEW  COMPARISON:  None.  FINDINGS: Patient status post open reduction internal fixation of comminuted fracture of the left humerus. Evidence of prior plate and screw fixation noted. This fracture may be old with nonunion. Repeat fracture at site of prior fracture.  IMPRESSION: Open reduction internal fixation left humeral fracture described above.   Electronically Signed   By: Marcello Moores  Register   On: 08/27/2013 12:44    Janifer Adie, PA-S, [facility]  [facility] Pager:[phone]  If 7PM-7AM, please contact night-coverage [email or web address] Password [email or web address] 09/17/2013, 8:14 AM   LOS: 2 days   **Disclaimer: This note may have been dictated with voice recognition software. Similar sounding words can inadvertently be transcribed and this note may contain transcription errors which may not have been corrected upon publication of note.**  Attending Patient was seen, examined,treatment plan was discussed with the Physician extender. I have directly reviewed the clinical findings, lab, imaging studies and management of this patient in detail. I have made the necessary changes to the above noted documentation, and agree with the documentation, as recorded by the Physician extender.  Nena Alexander MD [facility].

## 2013-09-18 ENCOUNTER — Encounter (HOSPITAL_COMMUNITY): Payer: Self-pay | Admitting: Anesthesiology

## 2013-09-18 ENCOUNTER — Encounter (HOSPITAL_COMMUNITY)
Admission: EM | Disposition: A | Discharge status: Home / Self Care | Payer: Self-pay | Source: Home / Self Care | Attending: Internal Medicine

## 2013-09-18 DIAGNOSIS — K819 Cholecystitis, unspecified: Secondary | ICD-10-CM

## 2013-09-18 DIAGNOSIS — I25810 Atherosclerosis of coronary artery bypass graft(s) without angina pectoris: Secondary | ICD-10-CM

## 2013-09-18 DIAGNOSIS — I4891 Unspecified atrial fibrillation: Secondary | ICD-10-CM

## 2013-09-18 LAB — COMPREHENSIVE METABOLIC PANEL
ALT: 105 U/L — ABNORMAL HIGH (ref 0–35)
AST: 81 U/L — ABNORMAL HIGH (ref 0–37)
Albumin: 2.3 g/dL — ABNORMAL LOW (ref 3.5–5.2)
Alkaline Phosphatase: 99 U/L (ref 39–117)
Anion gap: 17 — ABNORMAL HIGH (ref 5–15)
BUN: 19 mg/dL (ref 6–23)
CO2: 22 mEq/L (ref 19–32)
Calcium: 8.3 mg/dL — ABNORMAL LOW (ref 8.4–10.5)
Chloride: 95 mEq/L — ABNORMAL LOW (ref 96–112)
Creatinine, Ser: 1.03 mg/dL (ref 0.50–1.10)
GFR calc Af Amer: 67 mL/min — ABNORMAL LOW (ref >90)
GFR calc non Af Amer: 57 mL/min — ABNORMAL LOW (ref >90)
Glucose, Bld: 114 mg/dL — ABNORMAL HIGH (ref 70–99)
Potassium: 3.8 mEq/L (ref 3.7–5.3)
Sodium: 134 mEq/L — ABNORMAL LOW (ref 137–147)
Total Bilirubin: 0.6 mg/dL (ref 0.3–1.2)
Total Protein: 6.2 g/dL (ref 6.0–8.3)

## 2013-09-18 LAB — CBC
HCT: 29.3 % — ABNORMAL LOW (ref 36.0–46.0)
Hemoglobin: 9.3 g/dL — ABNORMAL LOW (ref 12.0–15.0)
MCH: 30 pg (ref 26.0–34.0)
MCHC: 31.7 g/dL (ref 30.0–36.0)
MCV: 94.5 fL (ref 78.0–100.0)
Platelets: 241 10*3/uL (ref 150–400)
RBC: 3.1 MIL/uL — ABNORMAL LOW (ref 3.87–5.11)
RDW: 14 % (ref 11.5–15.5)
WBC: 8.7 10*3/uL (ref 4.0–10.5)

## 2013-09-18 SURGERY — CANCELLED PROCEDURE

## 2013-09-18 SURGERY — ERCP, WITH INTERVENTION IF INDICATED
Anesthesia: General

## 2013-09-18 MED ORDER — DIGOXIN 0.25 MG/ML IJ SOLN
0.2500 mg | Freq: Four times a day (QID) | INTRAMUSCULAR | Status: AC
  Administered 2013-09-18 – 2013-09-19 (×3): 0.25 mg via INTRAVENOUS
  Filled 2013-09-18 (×6): qty 1

## 2013-09-18 MED ORDER — MORPHINE SULFATE 2 MG/ML IJ SOLN
1.0000 mg | INTRAMUSCULAR | Status: DC | PRN
  Administered 2013-09-18 (×2): 2 mg via INTRAVENOUS
  Administered 2013-09-19: 4 mg via INTRAVENOUS
  Administered 2013-09-19 (×3): 2 mg via INTRAVENOUS
  Administered 2013-09-19 (×2): 4 mg via INTRAVENOUS
  Administered 2013-09-20 (×2): 2 mg via INTRAVENOUS
  Filled 2013-09-18: qty 2
  Filled 2013-09-18 (×5): qty 1
  Filled 2013-09-18: qty 2
  Filled 2013-09-18 (×2): qty 1
  Filled 2013-09-18: qty 2
  Filled 2013-09-18: qty 1

## 2013-09-18 MED ORDER — CIPROFLOXACIN IN D5W 400 MG/200ML IV SOLN
INTRAVENOUS | Status: AC
  Filled 2013-09-18: qty 200

## 2013-09-18 MED ORDER — SODIUM CHLORIDE 0.9 % IV SOLN
INTRAVENOUS | Status: DC
  Administered 2013-09-18 – 2013-09-20 (×3): via INTRAVENOUS

## 2013-09-18 NOTE — Consult Note (Signed)
CONSULT NOTE  Date: 09/18/2013               Patient Name:  Wendy [name] MRN: [id number]  DOB: [birth year] Age / Sex: 63 y.o., female        PCP: Daphene Jaeger Primary Cardiologist: Lelon Frohlich),      [name] at [facility] previously       Referring Physician: Eliseo Squires              Reason for Consult: Rapid atrial fib           History of Present Illness: Patient is a 63 y.o. female with a PMHx of CAD, CABG, gall stones , who was admitted to [facility] on 09/15/2013 for evaluation of abdominal pain .   She had a cholecystectomy 7/13  and was  Scheduled for ERCP this am.   She was found to have rapid A-fib and we were asked to see.     She denies any chest pain or dyspnea.  She has not eaten since Friday ( 5 days)  She has lots of problems with anxiety and takes xanax regularly - 1 mg daily.  She has been getting 0.5 mg PRN and  thinks that she did not receive enough.  She has been getting some Ativan PRN but she is still quite anxious.    She cannot tell that her HR is irregular and denies any CP or dysnea. No previous hx of A-fib  Smoked until 15 years. Rare ETOH. Fam Hx:  Mother had CAD / CABG    Medications: Outpatient medications: Prescriptions prior to admission  Medication Sig Dispense Refill  . Aclidinium Bromide (TUDORZA PRESSAIR) 400 MCG/ACT AEPB Inhale 1 puff into the lungs daily.      Marland Kitchen albuterol (PROVENTIL HFA;VENTOLIN HFA) 108 (90 BASE) MCG/ACT inhaler Inhale 2 puffs into the lungs every 6 (six) hours as needed for wheezing or shortness of breath.      . ALPRAZolam (XANAX) 0.5 MG tablet Take 0.5 mg by mouth 3 (three) times daily as needed for anxiety.      Marland Kitchen aspirin EC 81 MG tablet Take 81 mg by mouth daily.      . beclomethasone (QVAR) 80 MCG/ACT inhaler Inhale 2 puffs into the lungs 2 (two) times daily.      . cetirizine (ZYRTEC) 10 MG tablet Take 10 mg by mouth daily.      . clopidogrel (PLAVIX) 75 MG tablet Take 75 mg by mouth daily with breakfast.      .  Cyanocobalamin 1000 MCG/ML KIT Inject 1,000 mcg as directed every 30 (thirty) days. 15th of each month      . docusate sodium 100 MG CAPS Take 100 mg by mouth 2 (two) times daily.  20 capsule  1  . ezetimibe (ZETIA) 10 MG tablet Take 10 mg by mouth daily.      . Fish Oil-Cholecalciferol (FISH OIL + D3 PO) Take 1 capsule by mouth daily.      . Fluticasone-Salmeterol (ADVAIR) 250-50 MCG/DOSE AEPB Inhale 1 puff into the lungs 2 (two) times daily.      . methocarbamol (ROBAXIN) 500 MG tablet Take 1-2 tablets (500-1,000 mg total) by mouth every 6 (six) hours as needed for muscle spasms.  90 tablet  0  . metoprolol (LOPRESSOR) 100 MG tablet Take 100 mg by mouth 2 (two) times daily.      . mometasone-formoterol (DULERA) 100-5 MCG/ACT AERO Inhale 2 puffs into the lungs  2 (two) times daily as needed for wheezing.      . nitroGLYCERIN (NITROSTAT) 0.4 MG SL tablet Place 0.4 mg under the tongue every 5 (five) minutes as needed for chest pain.      Marland Kitchen omeprazole (PRILOSEC) 40 MG capsule Take 40 mg by mouth 2 (two) times daily.      Marland Kitchen oxyCODONE (OXY IR/ROXICODONE) 5 MG immediate release tablet Take 1-2 tablets (5-10 mg total) by mouth every 3 (three) hours as needed for breakthrough pain.  50 tablet  0  . oxyCODONE-acetaminophen (PERCOCET/ROXICET) 5-325 MG per tablet Take 1-2 tablets by mouth every 6 (six) hours as needed for severe pain.  90 tablet  0  . simvastatin (ZOCOR) 20 MG tablet Take 20 mg by mouth daily.      Marland Kitchen triamterene-hydrochlorothiazide (DYAZIDE) 37.5-25 MG per capsule Take 1 capsule by mouth daily.        Current medications: Current Facility-Administered Medications  Medication Dose Route Frequency Provider Last Rate Last Dose  . [facility] HOLD] acetaminophen (TYLENOL) tablet 650 mg  650 mg Oral Q6H PRN Bynum Bellows, MD   650 mg at 09/17/13 2151   Or  . [facility] HOLD] acetaminophen (TYLENOL) suppository 650 mg  650 mg Rectal Q6H PRN Bynum Bellows, MD      . [facility] HOLD] albuterol (PROVENTIL) (2.5  MG/3ML) 0.083% nebulizer solution 3 mL  3 mL Inhalation Q6H PRN Bynum Bellows, MD      . [facility] HOLD] ALPRAZolam Duanne Moron) tablet 0.5 mg  0.5 mg Oral TID PRN Bynum Bellows, MD   0.5 mg at 09/18/13 0929  . [facility] HOLD] aspirin EC tablet 81 mg  81 mg Oral Daily Bynum Bellows, MD   81 mg at 09/17/13 0955  . [facility] HOLD] ciprofloxacin (CIPRO) IVPB 400 mg  400 mg Intravenous Q12H Bynum Bellows, MD   400 mg at 09/17/13 2035  . [[facility] HOLD] enoxaparin (LOVENOX) injection 40 mg  40 mg Subcutaneous Q24H Bynum Bellows, MD   40 mg at 09/17/13 2151  . [MAR HOLD] ezetimibe (ZETIA) tablet 10 mg  10 mg Oral Daily Bynum Bellows, MD   10 mg at 09/17/13 0955  . [MAR HOLD] fluticasone (FLOVENT HFA) 44 MCG/ACT inhaler 1 puff  1 puff Inhalation BID Bynum Bellows, MD   1 puff at 09/18/13 0935  . [facility] HOLD] HYDROmorphone (DILAUDID) injection 1 mg  1 mg Intravenous Q2H PRN Dianne Dun, NP   1 mg at 09/16/13 1049  . [MAR HOLD] HYDROmorphone (DILAUDID) PCA injection 0.3 mg/mL   Intravenous 6 times per day [name], MD   2.54 mg at 09/18/13 0800  . [[facility] HOLD] loratadine (CLARITIN) tablet 10 mg  10 mg Oral Daily Bynum Bellows, MD   10 mg at 09/17/13 0955  . [MAR HOLD] LORazepam (ATIVAN) injection 1 mg  1 mg Intravenous Q6H PRN Jonetta Osgood, MD   1 mg at 09/17/13 1423  . [facility] HOLD] methocarbamol (ROBAXIN) tablet 500-1,000 mg  500-1,000 mg Oral Q6H PRN Bynum Bellows, MD   500 mg at 09/16/13 1048  . [MAR HOLD] metoprolol (LOPRESSOR) tablet 100 mg  100 mg Oral BID Jonetta Osgood, MD   100 mg at 09/17/13 0955  . [MAR HOLD] mometasone-formoterol (DULERA) 100-5 MCG/ACT inhaler 2 puff  2 puff Inhalation BID Bynum Bellows, MD   2 puff at 09/18/13 0935  . [MAR HOLD] naloxone (NARCAN) injection 0.4 mg  0.4 mg  Intravenous PRN [name], MD       And  . [facility] HOLD] sodium chloride 0.9 % injection 9 mL  9 mL Intravenous PRN [name], MD      . [facility] HOLD] ondansetron [facility]) tablet 4 mg  4 mg  Oral Q6H PRN Bynum Bellows, MD       Or  . [facility] HOLD] ondansetron [facility]) injection 4 mg  4 mg Intravenous Q6H PRN Bynum Bellows, MD      . [facility] HOLD] ondansetron [facility]) injection 4 mg  4 mg Intravenous Q6H PRN [name], MD      . [facility] HOLD] pantoprazole (PROTONIX) EC tablet 80 mg  80 mg Oral BID Bynum Bellows, MD   80 mg at 09/17/13 2152  . [facility] HOLD] simvastatin (ZOCOR) tablet 20 mg  20 mg Oral q1800 Bynum Bellows, MD   20 mg at 09/17/13 1800  . [MAR HOLD] tiotropium (SPIRIVA) inhalation capsule 18 mcg  18 mcg Inhalation Daily Bynum Bellows, MD   18 mcg at 09/18/13 0935  . [MAR HOLD] triamterene-hydrochlorothiazide (DYAZIDE) 37.5-25 MG per capsule 1 capsule  1 capsule Oral Daily Jonetta Osgood, MD   1 capsule at 09/17/13 0955     Allergies  Allergen Reactions  . Benadryl [Diphenhydramine] Shortness Of Breath  . Adhesive [Tape]     Burning of skin  . Amoxicillin Nausea And Vomiting  . Flexeril [Cyclobenzaprine] Other (See Comments)    States that it messes with her blood circulation; pt states that she has to walk around     Past Medical History  Diagnosis Date  . Glucose intolerance (impaired glucose tolerance)   . Hypertension   . PAD (peripheral artery disease)   . COPD (chronic obstructive pulmonary disease)   . Peripheral neuropathy   . Chronic back pain   . Chronic leg pain   . CAD (coronary artery disease), native coronary artery     Hx CABG 1998, last PCI 2013; LHC 07/2013:  EF 55-60%, L-LAD prob atretic, no sig disease in LAD, S-OM1 ok with patent stent, S-dRCA ok => med Rx.  Marland Kitchen HLD (hyperlipidemia)   . Myocardial infarction   . Sleep apnea   . Shortness of breath     when walking  . Anxiety   . GERD (gastroesophageal reflux disease)     Past Surgical History  Procedure Laterality Date  . Cardiac surgery  1998    LTA-LAD, SVG-OM, SVG-RCA  . Coronary angioplasty with stent placement  11/2011, 02/2012    DES to SVG-RCA both times, In [place],  [place], Dr. Bruce Donath  . Cervical spine surgery  1991 and again several years later  . Knee arthroscopy Right 1998  . Finger surgery Right     ring finger  . Orif humerus fracture Left 08/27/2013    Procedure: OPEN REDUCTION INTERNAL FIXATION (ORIF) LEFT HUMERUS ;  Surgeon: Rozanna Box, MD;  Location: [place];  Service: Orthopedics;  Laterality: Left;  . Cholecystectomy  09/17/2013    dr [name]  . Cholecystectomy N/A 09/16/2013    Procedure: LAPAROSCOPIC CHOLECYSTECTOMY CONVERTED TO OPEN CHOLECYSTECTOMY WITH CHOLANGIOGRAM;  Surgeon: [name], MD;  Location: [place];  Service: General;  Laterality: N/A;    Family History  Problem Relation Age of Onset  . Lung cancer Mother     Social History:  reports that she quit smoking about 15 years ago. She has never used smokeless tobacco. She reports that  she does not drink alcohol or use illicit drugs.   Review of Systems: Constitutional:  admits to fever, chills, diaphoresis, poor appetite  HEENT: denies photophobia, eye pain, redness, hearing loss, ear pain, congestion, sore throat, rhinorrhea, sneezing, neck pain, neck stiffness and tinnitus.  Respiratory: denies SOB, DOE, cough, chest tightness, and wheezing.  Cardiovascular: denies chest pain, palpitations and leg swelling.  Gastrointestinal: admits to nausea, vomiting, abdominal pain,   Genitourinary: denies dysuria, urgency, frequency, hematuria, flank pain and difficulty urinating.  Musculoskeletal: denies  myalgias, back pain, joint swelling, arthralgias and gait problem.   Skin: denies pallor, rash and wound.  Neurological: denies dizziness, seizures, syncope, weakness, light-headedness, numbness and headaches.   Hematological: denies adenopathy, easy bruising, personal or family bleeding history.  Psychiatric/ Behavioral: denies suicidal ideation, mood changes, confusion, nervousness, sleep disturbance and agitation.    Physical Exam: BP 90/39  Pulse 126  Temp(Src) 99.2  F (37.3 C) (Axillary)  Resp 11  Ht _0  (1.575 m)  Wt 208 lb 8.9 oz (94.6 kg)  BMI 38.14 kg/m2  SpO2 95%  Wt Readings from Last 3 Encounters:  09/18/13 208 lb 8.9 oz (94.6 kg)  09/18/13 208 lb 8.9 oz (94.6 kg)  09/18/13 208 lb 8.9 oz (94.6 kg)    General: Vital signs reviewed and noted. Well-developed, well-nourished, in no acute distress; alert,   Head: Normocephalic, atraumatic, sclera anicteric,   Neck: Supple. Negative for carotid bruits. No JVD   Lungs:  Clear bilaterally, no  wheezes, rales, or rhonchi. Breathing is normal   Heart: Irreg Irreg  with S1 S2. No murmurs, rubs, or gallops   Abdomen:  Soft, few bowel sounds. RUQ bandage   MSK: Strength and the appear normal for age.   Extremities: No clubbing or cyanosis. No edema.  Distal pedal pulses are 2+ and equal   Neurologic: Alert and oriented X 3. Moves all extremities spontaneously.  Psych: Responds to questions appropriately with a normal affect.     Lab results: Basic Metabolic Panel:  Recent Labs Lab 09/16/13 0504 09/17/13 0105 09/18/13 0020  NA 138 135* 134*  K 3.7 4.3 3.8  CL 98 96 95*  CO2 _1 GLUCOSE 116* 121* 114*  BUN _2 CREATININE 0.69 0.95 1.03  CALCIUM 8.7 8.4 8.3*    Liver Function Tests:  Recent Labs Lab 09/16/13 0504 09/17/13 0105 09/18/13 0020  AST 19 176* 81*  ALT 22 138* 105*  ALKPHOS 89 87 99  BILITOT 0.6 0.8 0.6  PROT 6.8 6.3 6.2  ALBUMIN 2.8* 2.4* 2.3*    Recent Labs Lab 09/15/13 1302  LIPASE 9*   No results found for this basename: AMMONIA,  in the last 168 hours  CBC:  Recent Labs Lab 09/15/13 1302 09/16/13 0504 09/17/13 0105 09/18/13 0028  WBC 10.5 11.8* 11.5* 8.7  NEUTROABS 8.6*  --   --   --   HGB 12.5 11.2* 9.8* 9.3*  HCT 38.3 35.2* 31.0* 29.3*  MCV 91.8 93.9 93.7 94.5  PLT 255 236 251 241    Cardiac Enzymes: No results found for this basename: CKTOTAL, CKMB, CKMBINDEX, TROPONINI,  in the last 168 hours  BNP: No components found  with this basename: POCBNP,   CBG: No results found for this basename: GLUCAP,  in the last 168 hours  Coagulation Studies: No results found for this basename: LABPROT, INR,  in the last 72 hours   Other results: EKG:  A-fib with V rate of 108  Imaging: Dg Cholangiogram Operative  09/17/2013   CLINICAL DATA:  Cholecystectomy for cholecystitis.  EXAM: INTRAOPERATIVE CHOLANGIOGRAM  TECHNIQUE: Cholangiographic images from the C-arm fluoroscopic device were submitted for interpretation post-operatively. Please see the procedural report for the amount of contrast and the fluoroscopy time utilized.  COMPARISON:  None.  FINDINGS: Filling defects introduced into the common bile duct are likely related air bubbles. There is no evidence of biliary obstruction with contrast entering the duodenum. Visualized intrahepatic ducts are unremarkable.  IMPRESSION: Filling defects visualized in the CBD likely relate to air bubbles.   Electronically Signed   By: Aletta Edouard M.D.   On: 09/17/2013 08:09        Assessment & Plan:  1. Atrial fib :  She has developed atrial fib - I suspect due to combination of the following -  - has not eaten in 5 days  - stress / ? Transient infection associated with cholecyctitis and retained stones  - has not been getting her usual dose of xanax  She had a cath Jul 19, 2013 and has not significant CAD that would require PCI.  Her LV function was normal  Currently, her HR is 120 and her BP is 92/70. Will give her some Digoxin - BP is too low to consider Dilt drip at this point.  If we can get her HR down and her BP up slightly, I think she can go for ERCP - in fact, I think the A-Fib may improved / convert after the stones are removed.   Ultimately she will need long term anticoagulation but we cannot start that as long as she has these retained gall stones .    Will get an echo for another assessment of her cardiac  size and function.  She had a normal LV EF at  cath just 2 months ago. At this point, I do not think this is due to ACS - I dont think we need to order Troponins at this point    Following    Ramond Dial., MD, [facility] 09/18/2013, 11:00 AM Office - [phone] Pager [place]

## 2013-09-18 NOTE — Progress Notes (Signed)
Patient ID: Wendy [name], female   DOB: October 21, 1950, 63 y.o.   MRN: [id number] 2 Days Post-Op  Subjective: Patient feels okay today. Still with some soreness as expected. No nausea.  Objective: Vital signs in last 24 hours: Temp:  [97.3 F (36.3 C)-101 F (38.3 C)] 98.3 F (36.8 C) (07/15 0914) Pulse Rate:  [68-146] 146 (07/15 0914) Resp:  [12-26] 17 (07/15 0914) BP: (91-125)/(44-76) 100/52 mmHg (07/15 0914) SpO2:  [90 %-98 %] 90 % (07/15 0914) Weight:  [208 lb 8.9 oz (94.6 kg)] 208 lb 8.9 oz (94.6 kg) (07/15 0534) Last BM Date: 09/15/13  Intake/Output from previous day: 07/14 0701 - 07/15 0700 In: 440 [P.O.:240; IV Piggyback:200] Out: 1635 [Urine:1600; Drains:35] Intake/Output this shift:    PE: Abd: Soft, appropriately tender along the incision, nondistended, active bowel sounds. Dressing is clean, dry, and intact  Lab Results:   Recent Labs  09/17/13 0105 09/18/13 0028  WBC 11.5* 8.7  HGB 9.8* 9.3*  HCT 31.0* 29.3*  PLT 251 241   BMET  Recent Labs  09/17/13 0105 09/18/13 0020  NA 135* 134*  K 4.3 3.8  CL 96 95*  CO2 23 22  GLUCOSE 121* 114*  BUN 15 19  CREATININE 0.95 1.03  CALCIUM 8.4 8.3*   PT/INR No results found for this basename: LABPROT, INR,  in the last 72 hours CMP     Component Value Date/Time   NA 134* 09/18/2013 0020   K 3.8 09/18/2013 0020   CL 95* 09/18/2013 0020   CO2 22 09/18/2013 0020   GLUCOSE 114* 09/18/2013 0020   BUN 19 09/18/2013 0020   CREATININE 1.03 09/18/2013 0020   CALCIUM 8.3* 09/18/2013 0020   PROT 6.2 09/18/2013 0020   ALBUMIN 2.3* 09/18/2013 0020   AST 81* 09/18/2013 0020   ALT 105* 09/18/2013 0020   ALKPHOS 99 09/18/2013 0020   BILITOT 0.6 09/18/2013 0020   GFRNONAA 57* 09/18/2013 0020   GFRAA 67* 09/18/2013 0020   Lipase     Component Value Date/Time   LIPASE 9* 09/15/2013 1302       Studies/Results: Dg Cholangiogram Operative  09/17/2013   CLINICAL DATA:  Cholecystectomy for cholecystitis.  EXAM:  INTRAOPERATIVE CHOLANGIOGRAM  TECHNIQUE: Cholangiographic images from the C-arm fluoroscopic device were submitted for interpretation post-operatively. Please see the procedural report for the amount of contrast and the fluoroscopy time utilized.  COMPARISON:  None.  FINDINGS: Filling defects introduced into the common bile duct are likely related air bubbles. There is no evidence of biliary obstruction with contrast entering the duodenum. Visualized intrahepatic ducts are unremarkable.  IMPRESSION: Filling defects visualized in the CBD likely relate to air bubbles.   Electronically Signed   By: Aletta Edouard M.D.   On: 09/17/2013 08:09    Anti-infectives: Anti-infectives   Start     Dose/Rate Route Frequency Ordered Stop   09/15/13 2130  ciprofloxacin (CIPRO) IVPB 400 mg     400 mg 200 mL/hr over 60 Minutes Intravenous Every 12 hours 09/15/13 2120     09/15/13 1815  ciprofloxacin (CIPRO) IVPB 400 mg     400 mg 200 mL/hr over 60 Minutes Intravenous  Once 09/15/13 1812 09/15/13 2110       Assessment/Plan  1. POD 2, status post laparoscopic cholecystectomy   2. choledocholithiasis 3 anxiety disorder  Plan: 1. The patient is surgically doing well. She is going to get an ERCP today. Her diet may be advanced as tolerated after that from our standpoint. Once  she is tolerating more solid food, we will DC her PCA and plan on oral narcotics. This will likely be done tomorrow. We will follow along. 2. Continue antibiotic therapy. This may be transitioned to oral as okay with primary service.  LOS: 3 days    Saim Almanza E 09/18/2013, 9:26 AM Pager: [phone]

## 2013-09-18 NOTE — Progress Notes (Signed)
Late entry:  Case cancelled per dr Tamala Julian due to patient condition.

## 2013-09-18 NOTE — Progress Notes (Signed)
    Progress Note   Subjective  *Decreased abdominal pain.**   Objective  Vital signs in last 24 hours: Temp:  [97.3 F (36.3 C)-101 F (38.3 C)] 99.2 F (37.3 C) (07/15 1050) Pulse Rate:  [76-146] 76 (07/15 1336) Resp:  [11-18] 18 (07/15 1336) BP: (90-125)/(39-76) 94/42 mmHg (07/15 1336) SpO2:  [90 %-98 %] 98 % (07/15 1336) Weight:  [208 lb 8.9 oz (94.6 kg)] 208 lb 8.9 oz (94.6 kg) (07/15 0534) Last BM Date: 09/15/13 General:   Alert,  Well-developed,  white female in NAD Heart:  Regular rate and rhythm; no murmurs Abdomen:  Soft, nontender and nondistended. Normal bowel sounds, without guarding, and without rebound.   Extremities:  Without edema. Neurologic:  Alert and  oriented x4;  grossly normal neurologically. Psych:  Alert and cooperative. Normal mood and affect.  Intake/Output from previous day: 07/14 0701 - 07/15 0700 In: 440 [P.O.:240; IV Piggyback:200] Out: 1635 [Urine:1600; Drains:35] Intake/Output this shift:    Lab Results:  Recent Labs  09/16/13 0504 09/17/13 0105 09/18/13 0028  WBC 11.8* 11.5* 8.7  HGB 11.2* 9.8* 9.3*  HCT 35.2* 31.0* 29.3*  PLT 236 251 241   BMET  Recent Labs  09/16/13 0504 09/17/13 0105 09/18/13 0020  NA 138 135* 134*  K 3.7 4.3 3.8  CL 98 96 95*  CO2 25 23 22   GLUCOSE 116* 121* 114*  BUN 13 15 19   CREATININE 0.69 0.95 1.03  CALCIUM 8.7 8.4 8.3*   LFT  Recent Labs  09/18/13 0020  PROT 6.2  ALBUMIN 2.3*  AST 81*  ALT 105*  ALKPHOS 99  BILITOT 0.6   PT/INR No results found for this basename: LABPROT, INR,  in the last 72 hours Hepatitis Panel No results found for this basename: HEPBSAG, HCVAB, HEPAIGM, HEPBIGM,  in the last 72 hours  Studies/Results: Dg Cholangiogram Operative  09/17/2013   CLINICAL DATA:  Cholecystectomy for cholecystitis.  EXAM: INTRAOPERATIVE CHOLANGIOGRAM  TECHNIQUE: Cholangiographic images from the C-arm fluoroscopic device were submitted for interpretation post-operatively. Please  see the procedural report for the amount of contrast and the fluoroscopy time utilized.  COMPARISON:  None.  FINDINGS: Filling defects introduced into the common bile duct are likely related air bubbles. There is no evidence of biliary obstruction with contrast entering the duodenum. Visualized intrahepatic ducts are unremarkable.  IMPRESSION: Filling defects visualized in the CBD likely relate to air bubbles.   Electronically Signed   By: Aletta Edouard M.D.   On: 09/17/2013 08:09      Assessment & Plan  *1.  Possible choledocholithiasis - question of retained bile duct stones versus air bubbles.  LFTs are still abnormal although improved.  ERCP was postponed because of atrial fibrillation.*Plan to repeat LFTs in a.m.  If they continue to trend downward and she continues to improve clinically will defer the exam. 2.  atrial fibrillation*  Principal Problem:   Cholecystitis, acute Active Problems:   CAD (coronary artery disease)   Hypertension   HLD (hyperlipidemia)   PAD (peripheral artery disease)   COPD (chronic obstructive pulmonary disease)   Chronic back pain   Sleep apnea   Anxiety   GERD (gastroesophageal reflux disease)   Nonspecific abnormal results of liver function study     LOS: 3 days   Erskine Emery  09/18/2013, 1:45 PM

## 2013-09-18 NOTE — Progress Notes (Signed)
CRITICAL CARE Performed by: Bonne Dolores   Total critical care time: [place] care time was exclusive of separately billable procedures and treating other patients.  Critical care was necessary to treat or prevent imminent or life-threatening deterioration.  Critical care was time spent personally by me on the following activities: development of treatment plan with patient and/or surrogate as well as nursing, discussions with consultants, evaluation of patient's response to treatment, examination of patient, obtaining history from patient or surrogate, ordering and performing treatments and interventions, ordering and review of laboratory studies, ordering and review of radiographic studies, pulse oximetry and re-evaluation of patient's condition.

## 2013-09-18 NOTE — Progress Notes (Signed)
Report given to Antionette on [facility]

## 2013-09-18 NOTE — Progress Notes (Signed)
Late entry : Patient's heart rate 130's irregular afib flutter, Dr Tamala Julian in the unit, with order to do 12 lead ekg.

## 2013-09-18 NOTE — Progress Notes (Signed)
Family member states patient is resting comfortably and she does not want her to bothered while she is sleeping. She will have the nurse call once she wakes up so I can place on the CPAP.

## 2013-09-18 NOTE — Progress Notes (Signed)
RT brought CPAP for pt to wear for naps and at bedtime. Pt refuses at this time tho wear cpap. Family member at bedside with pt. RT instructed the pt and family member to call when pt was ready to wear cpap. Pt states she wants to wear it at bedtime. Family aware. Pt is awake and can follow commands. Pt in no distress. Pt on 3L [place] sats 98, HR 74, BBS CLR, RR 18. RN notified cpap at bedside.

## 2013-09-18 NOTE — Progress Notes (Signed)
PATIENT DETAILS Name: Wendy [name] Age: 63 y.o. Sex: female Date of Birth: 1950/12/18 Admit Date: 09/15/2013 Admitting Physician Bynum Bellows, MD PYP:PJKD, Marye Round, PA-C  Subjective: Went into a fib with RVR before ERCP could be done Back into sinus at time of my exam  Assessment/Plan:  Atrial fib with RVR- converted back to sinus -seen by cards -given dig    Cholecystitis, acute -S/p laparoscopic converted to open cholecystectomy on 7/13, cholangiogram suggestive of CBD stone.  -Diet NPO for now pending GI evaluation for possible ERCP -c/w PCA for pain control, Gen surgery managing post op issues. -cipro IV- change to PO when able to take oral  Suspected Choledocholithiasis -seen on intra-operative cholangiogram-seen by GI-ERCP on 7/15   Hypertension  -Stable.  -c/w Metoprolol and Dyazide   Hyperlipidemia  -Continue statin.   COPD  -Stable at this time. Continue home inhalers.   Obstructive sleep apnea  -Continue CPAP at bedtime.   GERD  -Continue PPI.   Anxiety  -Benzodiazepines as needed  Peripheral artery disease  -Stable.   Disposition:  Remain inpatient  DVT Prophylaxis:  Prophylactic Lovenox  Code Status:  Full code    Transfer to tele bed  Procedures: LAPAROSCOPIC CHOLECYSTECTOMY CONVERTED TO OPEN CHOLECYSTECTOMY WITH CHOLANGIOGRAM  CONSULTS:  GI and general surgery  Time spent 35 min   MEDICATIONS: Scheduled Meds: . [facility] HOLD] aspirin EC  81 mg Oral Daily  . [facility] HOLD] ciprofloxacin  400 mg Intravenous Q12H  . digoxin  0.25 mg Intravenous Q6H  . [[facility] HOLD] enoxaparin (LOVENOX) injection  40 mg Subcutaneous Q24H  . [facility] HOLD] ezetimibe  10 mg Oral Daily  . [[facility] HOLD] fluticasone  1 puff Inhalation BID  . [[facility] HOLD] HYDROmorphone PCA 0.3 mg/mL   Intravenous 6 times per day  . [facility] HOLD] loratadine  10 mg Oral Daily  . [facility] HOLD] metoprolol  100 mg Oral BID  . [[facility] HOLD] mometasone-formoterol  2 puff Inhalation BID    . [facility] HOLD] pantoprazole  80 mg Oral BID  . [facility] HOLD] simvastatin  20 mg Oral q1800  . [[facility] HOLD] tiotropium  18 mcg Inhalation Daily  . [facility] HOLD] triamterene-hydrochlorothiazide  1 capsule Oral Daily   Continuous Infusions:  PRN Meds:.[[facility] HOLD] acetaminophen, [[facility] HOLD] acetaminophen, [[facility] HOLD] albuterol, [[facility] HOLD] ALPRAZolam, [MAR HOLD]  HYDROmorphone (DILAUDID) injection, [[facility] HOLD] LORazepam, [[facility] HOLD] methocarbamol, [[facility] HOLD] naloxone, [[facility] HOLD] ondansetron (ZOFRAN) IV, [[facility] HOLD] ondansetron (ZOFRAN) IV, [[facility] HOLD] ondansetron, [[facility] HOLD] sodium chloride  Antibiotics: Anti-infectives   Start     Dose/Rate Route Frequency Ordered Stop   09/15/13 2130  [[facility] Hold]  ciprofloxacin (CIPRO) IVPB 400 mg     (On MAR Hold since 09/18/13 1026)   400 mg 200 mL/hr over 60 Minutes Intravenous Every 12 hours 09/15/13 2120     09/15/13 1815  ciprofloxacin (CIPRO) IVPB 400 mg     400 mg 200 mL/hr over 60 Minutes Intravenous  Once 09/15/13 1812 09/15/13 2110       PHYSICAL EXAM: Vital signs in last 24 hours: Filed Vitals:   09/18/13 1051 09/18/13 1127 09/18/13 1249 09/18/13 1251  BP: 90/39 90/60 107/50 94/58  Pulse: 130 121    Temp:      TempSrc:      Resp:      Height:      Weight:      SpO2: 96%       Weight change: 5.5  kg (12 lb 2 oz) Filed Weights   09/16/13 0617 09/17/13 0557 09/18/13 0534  Weight: 88 kg (194 lb 0.1 oz) 89.1 kg (196 lb 6.9 oz) 94.6 kg (208 lb 8.9 oz)   Body mass index is 38.14 kg/(m^2).   Gen Exam: Awake and alert with clear speech.   Neck: Supple, No JVD.   Chest: B/L Clear.   CVS: S1 S2 Regular, no murmurs.  Abdomen: Abd: Soft, appropriately tender, incision is covered and dressings are clean, dry, and intact. She has active bowel sounds. Her JP drain is serous sanguinous. Extremities: no edema, lower extremities warm to touch. Neurologic: Non Focal.   Skin: No Rash.   Wounds: Cholecystectomy incision  Intake/Output from previous  day:  Intake/Output Summary (Last 24 hours) at 09/18/13 1307 Last data filed at 09/18/13 0916  Gross per 24 hour  Intake    440 ml  Output   1385 ml  Net   -945 ml     LAB RESULTS: CBC  Recent Labs Lab 09/15/13 1302 09/16/13 0504 09/17/13 0105 09/18/13 0028  WBC 10.5 11.8* 11.5* 8.7  HGB 12.5 11.2* 9.8* 9.3*  HCT 38.3 35.2* 31.0* 29.3*  PLT 255 236 251 241  MCV 91.8 93.9 93.7 94.5  MCH 30.0 29.9 29.6 30.0  MCHC 32.6 31.8 31.6 31.7  RDW 13.9 14.1 13.9 14.0  LYMPHSABS 1.1  --   --   --   MONOABS 0.8  --   --   --   EOSABS 0.0  --   --   --   BASOSABS 0.0  --   --   --     Chemistries   Recent Labs Lab 09/15/13 1302 09/16/13 0504 09/17/13 0105 09/18/13 0020  NA 143 138 135* 134*  K 3.5* 3.7 4.3 3.8  CL 99 98 96 95*  CO2 23 25 23 22   GLUCOSE 111* 116* 121* 114*  BUN 16 13 15 19   CREATININE 0.69 0.69 0.95 1.03  CALCIUM 9.6 8.7 8.4 8.3*    CBG: No results found for this basename: GLUCAP,  in the last 168 hours  GFR Estimated Creatinine Clearance: 61.5 ml/min (by C-G formula based on Cr of 1.03).  Coagulation profile No results found for this basename: INR, PROTIME,  in the last 168 hours  Cardiac Enzymes No results found for this basename: CK, CKMB, TROPONINI, MYOGLOBIN,  in the last 168 hours  No components found with this basename: POCBNP,  No results found for this basename: DDIMER,  in the last 72 hours No results found for this basename: HGBA1C,  in the last 72 hours No results found for this basename: CHOL, HDL, LDLCALC, TRIG, CHOLHDL, LDLDIRECT,  in the last 72 hours No results found for this basename: TSH, T4TOTAL, FREET3, T3FREE, THYROIDAB,  in the last 72 hours No results found for this basename: VITAMINB12, FOLATE, FERRITIN, TIBC, IRON, RETICCTPCT,  in the last 72 hours No results found for this basename: LIPASE, AMYLASE,  in the last 72 hours  Urine Studies No results found for this basename: UACOL, UAPR, USPG, UPH, UTP, UGL, UKET, UBIL,  UHGB, UNIT, UROB, ULEU, UEPI, UWBC, URBC, UBAC, CAST, CRYS, UCOM, BILUA,  in the last 72 hours  MICROBIOLOGY: No results found for this or any previous visit (from the past 240 hour(s)).  RADIOLOGY STUDIES/RESULTS: Dg Chest 2 View  08/26/2013   CLINICAL DATA:  Preoperative chest x-ray prior to left humeral repair  EXAM: CHEST  2 VIEW  COMPARISON:  Prior chest  x-ray 07/19/2013  FINDINGS: Cardiac and mediastinal contours remain within normal limits. Patient is status post median sternotomy with evidence of multivessel CABG including LIMA bypass. Metallic stents project over the right and left anterior descending coronary arteries. The lungs are clear. No pleural effusion or pneumothorax. Incompletely imaged left mid humeral fracture. No acute osseous abnormality.  IMPRESSION: No active cardiopulmonary disease.  Evidence of prior percutaneous coronary artery stenting and multivessel CABG suggests a significant past history of coronary artery disease.   Electronically Signed   By: Jacqulynn Cadet M.D.   On: 08/26/2013 14:14   Dg Cholangiogram Operative  09/17/2013   CLINICAL DATA:  Cholecystectomy for cholecystitis.  EXAM: INTRAOPERATIVE CHOLANGIOGRAM  TECHNIQUE: Cholangiographic images from the C-arm fluoroscopic device were submitted for interpretation post-operatively. Please see the procedural report for the amount of contrast and the fluoroscopy time utilized.  COMPARISON:  None.  FINDINGS: Filling defects introduced into the common bile duct are likely related air bubbles. There is no evidence of biliary obstruction with contrast entering the duodenum. Visualized intrahepatic ducts are unremarkable.  IMPRESSION: Filling defects visualized in the CBD likely relate to air bubbles.   Electronically Signed   By: Aletta Edouard M.D.   On: 09/17/2013 08:09   US Abdomen Complete  09/15/2013   CLINICAL DATA:  Abdominal pain  EXAM: ULTRASOUND ABDOMEN COMPLETE  COMPARISON:  CT abdomen and pelvis September 15, 2013  FINDINGS: Gallbladder:  Sludge is seen within the gallbladder. There are no echogenic foci which move and shadowing consistent with gallstones. The gallbladder wall is thickened at 5 mm in thickness. There is no gallbladder wall edema or pericholecystic fluid. No sonographic Murphy sign noted.  Common bile duct:  Diameter: 6 mm. There is no intrahepatic, common hepatic, common bile duct dilatation.  Liver:  No focal lesion identified. Liver echogenicity is diffusely increased.  IVC:  No abnormality visualized.  Pancreas:  Visualized portion unremarkable. Portions of the pancreas were obscured by gas.  Spleen:  Size and appearance within normal limits.  Right Kidney:  Length: 10.2 cm. Echogenicity within normal limits. No mass or hydronephrosis visualized.  Left Kidney:  Length: 10.8 cm. Echogenicity within normal limits. No mass or hydronephrosis visualized. There is scarring in the lower pole region  Abdominal aorta:  No aneurysm visualized.  Portions of the aorta are obscured by gas.  Other findings:  No demonstrable ascites.  IMPRESSION: Gallbladder wall is thickened. There is no gallbladder wall edema or pericholecystic fluid, however. There is sludge in the gallbladder but no appreciable gallstones. The wall thickening could indicate a degree of acalculus cholecystitis.  Portions of the pancreas and abdominal aorta are obscured by gas. Visualized portions of these structures appear normal.  Increased echogenicity liver consistent with fatty change. While no focal liver lesions are identified, it must be cautioned that the sensitivity ofultrasound for focal liver lesions is diminished in this circumstance.   Electronically Signed   By: Lowella Grip M.D.   On: 09/15/2013 17:13   Ct Abdomen Pelvis W Contrast  09/15/2013   CLINICAL DATA:  Intermittent mid and upper abdominal pain beginning 1 week ago.  EXAM: CT ABDOMEN AND PELVIS WITH CONTRAST  TECHNIQUE: Multidetector CT imaging of the abdomen and  pelvis was performed using the standard protocol following bolus administration of intravenous contrast.  CONTRAST:  100 mL OMNIPAQUE IOHEXOL 300 MG/ML  SOLN  COMPARISON:  Aortic ultrasound 06/05/2013.  FINDINGS: Mild dependent atelectasis is seen in the lung bases. No pleural or pericardial  effusion.  The liver is diffusely low attenuating consistent with fatty infiltration. No focal liver lesion is seen. The gallbladder is mildly dilated and there is some stranding about the gallbladder. The pancreas, adrenal glands, spleen and kidneys appear normal.  Fem-fem bypass graft is identified and opacifies with contrast material. There is aortoiliac atherosclerosis without aneurysm. 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 Left common iliac artery also may be occluded with reconstitution of the internal and external iliacs.  The stomach, small bowel and appendix appear normal. No lymphadenopathy is identified.  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 No femoral head fragmentation or collapse is identified. Lumbar spondylosis is noted.  IMPRESSION: Dilated gallbladder with pericholecystic stranding worrisome for cholecystitis. Right upper quadrant ultrasound would be useful further evaluation.  Diffuse fatty infiltration of the liver.  Extensive vascular disease as detailed above. Fem-fem bypass graft appears patent.  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   Electronically Signed   By: Inge Rise M.D.   On: 09/15/2013 15:16   Dg Humerus Left  08/27/2013   CLINICAL DATA:  Postop  EXAM: LEFT HUMERUS - 2+ VIEW  COMPARISON:  Intraoperative C-arm spot films  FINDINGS: Plate and screw fixation of the comminuted fracture of the mid proximal left humerus is noted. The fixation plate is in good position. No complicating features are seen.  IMPRESSION: Fixation of mid proximal comminuted left humeral  fracture.   Electronically Signed   By: Ivar Drape M.D.   On: 08/27/2013 15:27   Dg Humerus Left  08/27/2013   CLINICAL DATA:  Humerus fracture.  EXAM: LEFT HUMERUS - 2+ VIEW  COMPARISON:  None.  FINDINGS: Patient status post open reduction internal fixation of comminuted fracture of the left humerus. Evidence of prior plate and screw fixation noted. This fracture may be old with nonunion. Repeat fracture at site of prior fracture.  IMPRESSION: Open reduction internal fixation left humeral fracture described above.   Electronically Signed   By: Marcello Moores  Register   On: 08/27/2013 12:44    Eulogio Bear,  [facility] Pager:[phone]  If 7PM-7AM, please contact night-coverage [email or web address] Password [email or web address] 09/18/2013, 1:07 PM   LOS: 3 days   **Disclaimer: This note may have been dictated with voice recognition software. Similar sounding words can inadvertently be transcribed and this note may contain transcription errors which may not have been corrected upon publication of note.**

## 2013-09-18 NOTE — Progress Notes (Signed)
I have seen and examined the patient and agree with the assessment and plans. ERCP today.  Kaydance Bowie A. [name]  MD, FACS

## 2013-09-18 NOTE — Progress Notes (Signed)
Pt alert and oriented x 4, able to follow commands, RR even and unlabored on 3L [place]. Pt admitted to unit with PCA dilaudid PCA pump, pump currently not infusing. MD notified for decreased BP. New orders received and implemented. Will continue to monitor pt closely, bed in lowest position, call bell within reach.

## 2013-09-18 NOTE — Progress Notes (Signed)
Digoxin 0.25 mg given IV slow heart rate 120's irregular Afib BP 90/68

## 2013-09-18 NOTE — Anesthesia Preprocedure Evaluation (Deleted)
Anesthesia Evaluation  Patient identified by MRN, date of birth, ID band Patient awake    Reviewed: Allergy & Precautions, H&P , NPO status , Patient's Chart, lab work & pertinent test results  Airway       Dental   Pulmonary shortness of breath, sleep apnea , COPDformer smoker,          Cardiovascular hypertension, + angina + CAD, + Past MI and + Peripheral Vascular Disease     Neuro/Psych Anxiety  Neuromuscular disease    GI/Hepatic GERD-  ,  Endo/Other    Renal/GU      Musculoskeletal   Abdominal   Peds  Hematology   Anesthesia Other Findings   Reproductive/Obstetrics                           Anesthesia Physical Anesthesia Plan  ASA: III  Anesthesia Plan: General   Post-op Pain Management:    Induction: Intravenous  Airway Management Planned: Oral ETT  Additional Equipment:   Intra-op Plan:   Post-operative Plan: Extubation in OR  Informed Consent: I have reviewed the patients History and Physical, chart, labs and discussed the procedure including the risks, benefits and alternatives for the proposed anesthesia with the patient or authorized representative who has indicated his/her understanding and acceptance.     Plan Discussed with:   Anesthesia Plan Comments:         Anesthesia Quick Evaluation

## 2013-09-18 NOTE — Progress Notes (Signed)
Dr [name] came in to see patient.

## 2013-09-18 NOTE — Progress Notes (Signed)
LAte entry: 1034 12 lead ekg done Dr Tamala Julian saw result- with order to consult cardiologist.

## 2013-09-19 ENCOUNTER — Inpatient Hospital Stay (HOSPITAL_COMMUNITY): Payer: Medicare Other

## 2013-09-19 ENCOUNTER — Encounter (HOSPITAL_COMMUNITY): Payer: Self-pay | Admitting: Anesthesiology

## 2013-09-19 ENCOUNTER — Encounter (HOSPITAL_COMMUNITY): Payer: Medicare Other | Admitting: Anesthesiology

## 2013-09-19 ENCOUNTER — Encounter (HOSPITAL_COMMUNITY)
Admission: EM | Disposition: A | Discharge status: Home / Self Care | Payer: Self-pay | Source: Home / Self Care | Attending: Internal Medicine

## 2013-09-19 ENCOUNTER — Inpatient Hospital Stay (HOSPITAL_COMMUNITY): Payer: Medicare Other | Admitting: Anesthesiology

## 2013-09-19 DIAGNOSIS — G4730 Sleep apnea, unspecified: Secondary | ICD-10-CM

## 2013-09-19 HISTORY — PX: ERCP: SHX5425

## 2013-09-19 LAB — COMPREHENSIVE METABOLIC PANEL
ALT: 67 U/L — ABNORMAL HIGH (ref 0–35)
AST: 36 U/L (ref 0–37)
Albumin: 2.2 g/dL — ABNORMAL LOW (ref 3.5–5.2)
Alkaline Phosphatase: 100 U/L (ref 39–117)
Anion gap: 15 (ref 5–15)
BUN: 10 mg/dL (ref 6–23)
CO2: 28 mEq/L (ref 19–32)
Calcium: 8.6 mg/dL (ref 8.4–10.5)
Chloride: 97 mEq/L (ref 96–112)
Creatinine, Ser: 0.73 mg/dL (ref 0.50–1.10)
GFR calc Af Amer: >90 mL/min (ref >90)
GFR calc non Af Amer: >90 mL/min (ref >90)
Glucose, Bld: 91 mg/dL (ref 70–99)
Potassium: 3.8 mEq/L (ref 3.7–5.3)
Sodium: 140 mEq/L (ref 137–147)
Total Bilirubin: 0.7 mg/dL (ref 0.3–1.2)
Total Protein: 6.1 g/dL (ref 6.0–8.3)

## 2013-09-19 LAB — CBC
HCT: 27.8 % — ABNORMAL LOW (ref 36.0–46.0)
Hemoglobin: 8.7 g/dL — ABNORMAL LOW (ref 12.0–15.0)
MCH: 29.4 pg (ref 26.0–34.0)
MCHC: 31.3 g/dL (ref 30.0–36.0)
MCV: 93.9 fL (ref 78.0–100.0)
Platelets: 189 10*3/uL (ref 150–400)
RBC: 2.96 MIL/uL — ABNORMAL LOW (ref 3.87–5.11)
RDW: 13.6 % (ref 11.5–15.5)
WBC: 5.5 10*3/uL (ref 4.0–10.5)

## 2013-09-19 LAB — T4, FREE: Free T4: 0.96 ng/dL (ref 0.80–1.80)

## 2013-09-19 LAB — TSH: TSH: 0.517 u[IU]/mL (ref 0.350–4.500)

## 2013-09-19 SURGERY — ERCP, WITH INTERVENTION IF INDICATED
Anesthesia: General

## 2013-09-19 MED ORDER — GLUCAGON HCL RDNA (DIAGNOSTIC) 1 MG IJ SOLR
INTRAMUSCULAR | Status: AC
Start: 2013-09-19 — End: 2013-09-19
  Filled 2013-09-19: qty 1

## 2013-09-19 MED ORDER — HYDROMORPHONE HCL PF 1 MG/ML IJ SOLN
0.2500 mg | INTRAMUSCULAR | Status: DC | PRN
  Administered 2013-09-19: 0.5 mg via INTRAVENOUS
  Filled 2013-09-19: qty 1

## 2013-09-19 MED ORDER — GLUCAGON HCL RDNA (DIAGNOSTIC) 1 MG IJ SOLR
INTRAMUSCULAR | Status: DC | PRN
  Administered 2013-09-19: 1 mg via INTRAVENOUS

## 2013-09-19 MED ORDER — LACTATED RINGERS IV SOLN
INTRAVENOUS | Status: DC
Start: 2013-09-19 — End: 2013-09-19
  Administered 2013-09-19: 12:00:00 via INTRAVENOUS

## 2013-09-19 MED ORDER — ONDANSETRON HCL 4 MG/2ML IJ SOLN: 4.0000 mg | Freq: Once | INTRAMUSCULAR | Status: AC | PRN

## 2013-09-19 MED ORDER — FENTANYL CITRATE 0.05 MG/ML IJ SOLN
INTRAMUSCULAR | Status: DC | PRN
  Administered 2013-09-19: 150 ug via INTRAVENOUS
  Administered 2013-09-19: 25 ug via INTRAVENOUS

## 2013-09-19 MED ORDER — SODIUM CHLORIDE 0.9 % IV SOLN
INTRAVENOUS | Status: DC | PRN
  Administered 2013-09-19: 15:00:00

## 2013-09-19 MED ORDER — OXYCODONE HCL 5 MG PO TABS: 5.0000 mg | ORAL_TABLET | Freq: Once | ORAL | Status: AC | PRN

## 2013-09-19 MED ORDER — LIDOCAINE HCL (CARDIAC) 20 MG/ML IV SOLN
INTRAVENOUS | Status: DC | PRN
  Administered 2013-09-19: 50 mg via INTRAVENOUS

## 2013-09-19 MED ORDER — OXYCODONE HCL 5 MG/5ML PO SOLN: 5.0000 mg | Freq: Once | ORAL | Status: AC | PRN

## 2013-09-19 MED ORDER — SUCCINYLCHOLINE CHLORIDE 20 MG/ML IJ SOLN
INTRAMUSCULAR | Status: DC | PRN
  Administered 2013-09-19: 100 mg via INTRAVENOUS

## 2013-09-19 MED ORDER — DILTIAZEM HCL 100 MG IV SOLR
5.0000 mg/h | INTRAVENOUS | Status: DC
  Administered 2013-09-19: 5 mg/h via INTRAVENOUS
  Filled 2013-09-19 (×2): qty 100

## 2013-09-19 MED ORDER — ONDANSETRON HCL 4 MG/2ML IJ SOLN
INTRAMUSCULAR | Status: DC | PRN
  Administered 2013-09-19: 4 mg via INTRAVENOUS

## 2013-09-19 MED ORDER — PROPOFOL 10 MG/ML IV BOLUS
INTRAVENOUS | Status: DC | PRN
  Administered 2013-09-19: 150 mg via INTRAVENOUS

## 2013-09-19 MED ORDER — MEPERIDINE HCL 25 MG/ML IJ SOLN: 6.2500 mg | INTRAMUSCULAR | Status: DC | PRN

## 2013-09-19 MED ORDER — LACTATED RINGERS IV SOLN
INTRAVENOUS | Status: DC | PRN
  Administered 2013-09-19: 13:00:00 via INTRAVENOUS

## 2013-09-19 NOTE — Op Note (Signed)
[facility] [place], [id number]   ERCP PROCEDURE REPORT  PATIENT: Wendy [name], Wendy [name].  MR# :[id number] BIRTHDATE: 08-23-1950  GENDER: Female ENDOSCOPIST: [name], MD REFERRED BY: Coralie Keens, M.D. PROCEDURE DATE:  09/19/2013 PROCEDURE:   ERCP with sphincterotomy/papillotomy ASA CLASS:   Class II INDICATIONS:suspected or rule out bile duct stones. MEDICATIONS: MAC sedation, administered by CRNA TOPICAL ANESTHETIC:  DESCRIPTION OF PROCEDURE:   After the risks benefits and alternatives of the procedure were thoroughly explained, informed consent was obtained.  The     endoscope was introduced through the mouth  and advanced to the third portion of the duodenum .  1.  The pancreatic duct was cannulated with a 0.35 mm guidewire.  A limited injection was made.  the duct appear normal. Several attempts were made to cannulate the common bile duct with the guidewire.  The guidewire kept falling into the pancreatic duct.  To facilitate cannulation the common bile duct a #4 French 3 cm pigtail stent was placed.  Following this the common bile duct was cannulated with a guidewire.  Injection did not demonstrate any stones.  Duct was normal caliber.  There were no strictures.  I attempted to pass a balloon catheter into the proximal duct for the purpose of sweeping the duct and ensuring that there were no retained stones but was unable to pass the deflated balloon catheter past the ampulla.  To facilitate passage of the balloon catheter a 12 mm sphincterotomy was made.  There was no bleeding. Following this the 12 mm balloon catheter was inserted to the proximal common hepatiThe pancreatic stent was removed with a polypectomy snare.c duct and pulled through the sphincterotomy papilla several times.  No stones were seen.  Final cholangiogram was normal.  The scope was then completely withdrawn from the patient and the procedure  terminated.     COMPLICATIONS:  ENDOSCOPIC IMPRESSION: normal ERCP-status post endoscopic sphincterotomy  RECOMMENDATIONS: Advance diet as tolerated    _______________________________ eSigned:  [name], MD 09/19/2013 3:27 PM   CC:

## 2013-09-19 NOTE — Progress Notes (Signed)
Pt called called for pain meds, while in to give meds pt and family states that pt wants to move floors. " she has not been happy since she got to this floor. I ask why, per pt and family " She has not be getting her meds and no ones comes in to take care of her, she laid in wet bed for 1 hr last night!" I told her I had given pain meds around 0725 and I have more now with some ativan. I tried to explain to pt and family she needed to get up and walk at least 3 times today to prevent ileus and pnx. She said she cant walk she is hurting to bad.

## 2013-09-19 NOTE — Anesthesia Procedure Notes (Signed)
Procedure Name: Intubation Date/Time: 09/26/2013 2:03 PM Performed by: Eligha Bridegroom Pre-anesthesia Checklist: Patient identified, Patient being monitored, Emergency Drugs available, Timeout performed and Suction available Patient Re-evaluated:Patient Re-evaluated prior to inductionOxygen Delivery Method: Circle system utilized Preoxygenation: Pre-oxygenation with 100% oxygen Intubation Type: IV induction Ventilation: Mask ventilation without difficulty Laryngoscope Size: Mac and 3 Grade View: Grade II Tube type: Oral Tube size: 7.0 mm Number of attempts: 1 Airway Equipment and Method: Stylet Secured at: 22 cm Tube secured with: Tape Dental Injury: Teeth and Oropharynx as per pre-operative assessment

## 2013-09-19 NOTE — Transfer of Care (Signed)
Immediate Anesthesia Transfer of Care Note  Patient: Wendy [name]  Procedure(s) Performed: Procedure(s): ENDOSCOPIC RETROGRADE CHOLANGIOPANCREATOGRAPHY (ERCP) (N/A)  Patient Location: PACU and Endoscopy Unit  Anesthesia Type:General  Level of Consciousness: awake, alert  and oriented  Airway & Oxygen Therapy: Patient Spontanous Breathing and Patient connected to nasal cannula oxygen  Post-op Assessment: Report given to PACU RN and Post -op Vital signs reviewed and stable  Post vital signs: Reviewed and stable  Complications: No apparent anesthesia complications

## 2013-09-19 NOTE — Anesthesia Preprocedure Evaluation (Signed)
Anesthesia Evaluation  Patient identified by MRN, date of birth, ID band Patient awake    Reviewed: Allergy & Precautions, H&P , NPO status , Patient's Chart, lab work & pertinent test results  Airway Mallampati: II TM Distance: >3 FB Neck ROM: Full    Dental   Pulmonary sleep apnea , COPDformer smoker,          Cardiovascular hypertension, Pt. on medications + Past MI     Neuro/Psych    GI/Hepatic GERD-  Controlled and Medicated,  Endo/Other    Renal/GU      Musculoskeletal   Abdominal   Peds  Hematology   Anesthesia Other Findings   Reproductive/Obstetrics                           Anesthesia Physical Anesthesia Plan  ASA: III  Anesthesia Plan: General   Post-op Pain Management:    Induction: Intravenous  Airway Management Planned: Oral ETT  Additional Equipment:   Intra-op Plan:   Post-operative Plan: Extubation in OR  Informed Consent: I have reviewed the patients History and Physical, chart, labs and discussed the procedure including the risks, benefits and alternatives for the proposed anesthesia with the patient or authorized representative who has indicated his/her understanding and acceptance.     Plan Discussed with: CRNA and Surgeon  Anesthesia Plan Comments:         Anesthesia Quick Evaluation

## 2013-09-19 NOTE — Progress Notes (Signed)
Pt called out for pain meds. In to give meds, had to shake pt to get aroused to let her know I was there to give meds. Family asleep in chair. Pt had no s/s of distress at the time. Pt wanted up to [facility], assisted up to [facility] and call bell put at side.

## 2013-09-19 NOTE — Progress Notes (Signed)
Patient: Wendy [name] / Admit Date: 09/15/2013 / Date of Encounter: 09/19/2013, 8:41 AM   Subjective: Continued significant RUQ pain. + passing flatus. No GI bleeding. No CP or dyspnea.   Objective: Telemetry: Only tele is available from 2pm on yesterday and this is all NSR (tele from several days ago also NSR) but EKG yesterday AM showed coarse AF Physical Exam: Blood pressure 115/65, pulse 80, temperature 98 F (36.7 C), temperature source Oral, resp. rate 20, height 5\' 2"  (1.575 m), weight 198 lb 3.1 oz (89.9 kg), SpO2 95.00%. General: Well developed, well nourished uncomfortable appearing obese F laying flat in bed. Head: Normocephalic, atraumatic, sclera non-icteric, no xanthomas, nares are without discharge. Neck: JVP not elevated. Lungs: Clear bilaterally to auscultation without wheezes, rales, or rhonchi. Breathing is unlabored. Heart: RRR S1 S2 without murmurs, rubs, or gallops.  Abdomen: Guarding RUQ. + BS in all quadrants. Extremities: No clubbing or cyanosis. No edema. Distal pedal pulses are 2+ and equal bilaterally. Neuro: Alert and oriented X 3. Moves all extremities spontaneously. Psych:  Responds to questions appropriately with a normal affect.   Intake/Output Summary (Last 24 hours) at 09/19/13 0841 Last data filed at 09/19/13 0700  Gross per 24 hour  Intake      0 ml  Output    780 ml  Net   -780 ml    Inpatient Medications:  . aspirin EC  81 mg Oral Daily  . ciprofloxacin  400 mg Intravenous Q12H  . enoxaparin (LOVENOX) injection  40 mg Subcutaneous Q24H  . ezetimibe  10 mg Oral Daily  . fluticasone  1 puff Inhalation BID  . loratadine  10 mg Oral Daily  . metoprolol  100 mg Oral BID  . mometasone-formoterol  2 puff Inhalation BID  . pantoprazole  80 mg Oral BID  . simvastatin  20 mg Oral q1800  . tiotropium  18 mcg Inhalation Daily  . triamterene-hydrochlorothiazide  1 capsule Oral Daily   Infusions:  . sodium chloride 75 mL/hr at 09/18/13 1623     Labs:  Recent Labs  09/18/13 0020 09/19/13 0615  NA 134* 140  K 3.8 3.8  CL 95* 97  CO2 22 28  GLUCOSE 114* 91  BUN 19 10  CREATININE 1.03 0.73  CALCIUM 8.3* 8.6    Recent Labs  09/18/13 0020 09/19/13 0615  AST 81* 36  ALT 105* 67*  ALKPHOS 99 100  BILITOT 0.6 0.7  PROT 6.2 6.1  ALBUMIN 2.3* 2.2*    Recent Labs  09/18/13 0028 09/19/13 0612  WBC 8.7 5.5  HGB 9.3* 8.7*  HCT 29.3* 27.8*  MCV 94.5 93.9  PLT 241 189   No results found for this basename: CKTOTAL, CKMB, TROPONINI,  in the last 72 hours No components found with this basename: POCBNP,  No results found for this basename: HGBA1C,  in the last 72 hours   Radiology/Studies:  Dg Chest 2 View  08/26/2013   CLINICAL DATA:  Preoperative chest x-ray prior to left humeral repair  EXAM: CHEST  2 VIEW  COMPARISON:  Prior chest x-ray 07/19/2013  FINDINGS: Cardiac and mediastinal contours remain within normal limits. Patient is status post median sternotomy with evidence of multivessel CABG including LIMA bypass. Metallic stents project over the right and left anterior descending coronary arteries. The lungs are clear. No pleural effusion or pneumothorax. Incompletely imaged left mid humeral fracture. No acute osseous abnormality.  IMPRESSION: No active cardiopulmonary disease.  Evidence of prior percutaneous coronary artery stenting  and multivessel CABG suggests a significant past history of coronary artery disease.   Electronically Signed   By: Jacqulynn Cadet M.D.   On: 08/26/2013 14:14   Dg Cholangiogram Operative  09/17/2013   CLINICAL DATA:  Cholecystectomy for cholecystitis.  EXAM: INTRAOPERATIVE CHOLANGIOGRAM  TECHNIQUE: Cholangiographic images from the C-arm fluoroscopic device were submitted for interpretation post-operatively. Please see the procedural report for the amount of contrast and the fluoroscopy time utilized.  COMPARISON:  None.  FINDINGS: Filling defects introduced into the common bile duct are  likely related air bubbles. There is no evidence of biliary obstruction with contrast entering the duodenum. Visualized intrahepatic ducts are unremarkable.  IMPRESSION: Filling defects visualized in the CBD likely relate to air bubbles.   Electronically Signed   By: Aletta Edouard M.D.   On: 09/17/2013 08:09   US Abdomen Complete  09/15/2013   CLINICAL DATA:  Abdominal pain  EXAM: ULTRASOUND ABDOMEN COMPLETE  COMPARISON:  CT abdomen and pelvis September 15, 2013  FINDINGS: Gallbladder:  Sludge is seen within the gallbladder. There are no echogenic foci which move and shadowing consistent with gallstones. The gallbladder wall is thickened at 5 mm in thickness. There is no gallbladder wall edema or pericholecystic fluid. No sonographic Murphy sign noted.  Common bile duct:  Diameter: 6 mm. There is no intrahepatic, common hepatic, common bile duct dilatation.  Liver:  No focal lesion identified. Liver echogenicity is diffusely increased.  IVC:  No abnormality visualized.  Pancreas:  Visualized portion unremarkable. Portions of the pancreas were obscured by gas.  Spleen:  Size and appearance within normal limits.  Right Kidney:  Length: 10.2 cm. Echogenicity within normal limits. No mass or hydronephrosis visualized.  Left Kidney:  Length: 10.8 cm. Echogenicity within normal limits. No mass or hydronephrosis visualized. There is scarring in the lower pole region  Abdominal aorta:  No aneurysm visualized.  Portions of the aorta are obscured by gas.  Other findings:  No demonstrable ascites.  IMPRESSION: Gallbladder wall is thickened. There is no gallbladder wall edema or pericholecystic fluid, however. There is sludge in the gallbladder but no appreciable gallstones. The wall thickening could indicate a degree of acalculus cholecystitis.  Portions of the pancreas and abdominal aorta are obscured by gas. Visualized portions of these structures appear normal.  Increased echogenicity liver consistent with fatty change.  While no focal liver lesions are identified, it must be cautioned that the sensitivity ofultrasound for focal liver lesions is diminished in this circumstance.   Electronically Signed   By: Lowella Grip M.D.   On: 09/15/2013 17:13   Ct Abdomen Pelvis W Contrast  09/15/2013   CLINICAL DATA:  Intermittent mid and upper abdominal pain beginning 1 week ago.  EXAM: CT ABDOMEN AND PELVIS WITH CONTRAST  TECHNIQUE: Multidetector CT imaging of the abdomen and pelvis was performed using the standard protocol following bolus administration of intravenous contrast.  CONTRAST:  100 mL OMNIPAQUE IOHEXOL 300 MG/ML  SOLN  COMPARISON:  Aortic ultrasound 06/05/2013.  FINDINGS: Mild dependent atelectasis is seen in the lung bases. No pleural or pericardial effusion.  The liver is diffusely low attenuating consistent with fatty infiltration. No focal liver lesion is seen. The gallbladder is mildly dilated and there is some stranding about the gallbladder. The pancreas, adrenal glands, spleen and kidneys appear normal.  Fem-fem bypass graft is identified and opacifies with contrast material. There is aortoiliac atherosclerosis without aneurysm. 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 Left common iliac artery  also may be occluded with reconstitution of the internal and external iliacs.  The stomach, small bowel and appendix appear normal. No lymphadenopathy is identified.  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 No femoral head fragmentation or collapse is identified. Lumbar spondylosis is noted.  IMPRESSION: Dilated gallbladder with pericholecystic stranding worrisome for cholecystitis. Right upper quadrant ultrasound would be useful further evaluation.  Diffuse fatty infiltration of the liver.  Extensive vascular disease as detailed above. Fem-fem bypass graft appears patent.  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   Electronically Signed   By: Inge Rise M.D.   On: 09/15/2013 15:16   Dg Humerus Left  08/27/2013   CLINICAL DATA:  Postop  EXAM: LEFT HUMERUS - 2+ VIEW  COMPARISON:  Intraoperative C-arm spot films  FINDINGS: Plate and screw fixation of the comminuted fracture of the mid proximal left humerus is noted. The fixation plate is in good position. No complicating features are seen.  IMPRESSION: Fixation of mid proximal comminuted left humeral fracture.   Electronically Signed   By: Ivar Drape M.D.   On: 08/27/2013 15:27   Dg Humerus Left  08/27/2013   CLINICAL DATA:  Humerus fracture.  EXAM: LEFT HUMERUS - 2+ VIEW  COMPARISON:  None.  FINDINGS: Patient status post open reduction internal fixation of comminuted fracture of the left humerus. Evidence of prior plate and screw fixation noted. This fracture may be old with nonunion. Repeat fracture at site of prior fracture.  IMPRESSION: Open reduction internal fixation left humeral fracture described above.   Electronically Signed   By: Marcello Moores  Register   On: 08/27/2013 12:44   Dg C-arm 61-120 Min  09/17/2013   CLINICAL DATA:  Humerus fracture.  EXAM: LEFT HUMERUS - 2+ VIEW  COMPARISON:  None.  FINDINGS: Patient status post open reduction internal fixation of comminuted fracture of the left humerus. Evidence of prior plate and screw fixation noted. This fracture may be old with nonunion. Repeat fracture at site of prior fracture.  IMPRESSION: Open reduction internal fixation left humeral fracture described above.   Electronically Signed   By: Marcello Moores  Register   On: 08/27/2013 12:44     Assessment and Plan  1. Acute cholecystitis s/p open chole 09/17/13 c/b increase in transaminitis with possible retained stones and stricturing of CBD, pending possible ERCP 2. Newly recognized atrial fibrillation, transient 3. CAD s/p prior CABG 1998, last PCI 2013, last cath stable anatomy 07/2013 4. PAD with history of RIA occlusion, R femoral thrombectomy,  fem-fem BPG 5. Anemia, new, ?post-op related 6. UTI, per IM  She is back in NSR after getting digoxin load. Did not receive metoprolol yesterday in setting of procedural disruption in AM and soft BP in AM. Will d/c Dyazide to allow more BP room to be able to utilize AVN blocking agent. Suspect AF precipitated by acute illness, pain, not eating x 5 days, and not getting usual dose of Xanax. CHADSVASC = 3. Dr. Elmarie Shiley note references that ultimately she will need long term anticoagulation but cannot start so long as GI procedures are still pending. She was on ASA/Plavix prior to admission - presume the Plavix was in part due to her PVD operations as last PCI was 2013 per record. (She is followed by Dr. [name] in [place].) Plavix has been held by admitting teams. Another option is to observe for recurrent AF and make a determination regarding long-term anticoagulation if this  recurs. 2D Echo pending. Will also add thyroid function testing. Continue aspirin as tolerated, BB, statin. Anemia per IM/GI.  Signed, Melina Copa PA-C  Patient seen.  Agree with assessment and plan as noted above.  The patient will be undergoing ERCP today to look for retained common bile duct stones. Telemetry has been stable normal sinus rhythm.  I suspect that he transient atrial fibrillation was secondary to recent stress of surgery.  Would favor observing for recurrent atrial fibrillation and considering outpatient event monitoring before committing to long-term anticoagulation.

## 2013-09-19 NOTE — Progress Notes (Signed)
I have reviewed the [place] again with radiology.  We are both very suspicious that there are retained bile duct stones.  In addition, there is some narrowing in the midportion of the common bile duct suspicious for stricture.  Plan ERCP today

## 2013-09-19 NOTE — Care Management Note (Unsigned)
    Page 1 of 1   09/19/2013     4:13:41 PM CARE MANAGEMENT NOTE 09/19/2013  Patient:  Wendy [name], Wendy [name]   Account Number:  [id number]  Date Initiated:  09/19/2013  Documentation initiated by:  GRAVES-BIGELOW,Oneda Duffett  Subjective/Objective Assessment:   Pt admitted for Abdominal pain- ERCP 09-19-13     Action/Plan:   CM will continue to monitor for disposition needs.   Anticipated DC Date:  09/20/2013   Anticipated DC Plan:  [place]  CM consult      Choice offered to / List presented to:             Status of service:  In process, will continue to follow Medicare Important Message given?  YES (If response is "NO", the following Medicare IM given date fields will be blank) Date Medicare IM given:  09/19/2013 Medicare IM given by:  GRAVES-BIGELOW,Laurieann Friddle Date Additional Medicare IM given:   Additional Medicare IM given by:    Discharge Disposition:    Per UR Regulation:  Reviewed for med. necessity/level of care/duration of stay  If discussed at [place] of Stay Meetings, dates discussed:    Comments:

## 2013-09-19 NOTE — Interval H&P Note (Signed)
History and Physical Interval Note:  09/19/2013 1:51 PM  Wendy [name]  has presented today for surgery, with the diagnosis of ? bile duct stone and stricture.  The various methods of treatment have been discussed with the patient and family. After consideration of risks, benefits and other options for treatment, the patient has consented to  Procedure(s): ENDOSCOPIC RETROGRADE CHOLANGIOPANCREATOGRAPHY (ERCP) (N/A) as a surgical intervention .  The patient's history has been reviewed, patient examined, no change in status, stable for surgery.  I have reviewed the patient's chart and labs.  Questions were answered to the patient's satisfaction.    The recent H&P (dated **09/19/13*) was reviewed, the patient was examined and there is no change in the patients condition since that H&P was completed. The risks, benefits, and possible complications of the procedure, including  But not limited to bleeding, perforation, surgery, and the 5-10% risk for pancreatitis, were explained to the patient.  It was explained that  post ERCP pancreatitis which, while usually is mild, occasionally maybe severe and even life-threatening.  Patient's questions were answered.   Wendy [name]  09/19/2013, 1:51 PM     Wendy [name]

## 2013-09-19 NOTE — Progress Notes (Signed)
ERCP did not demonstrate any stones.  A sphincterotomy was done to facilitate passage of the balloon catheter to ensure that the duct was clear.  There were no strictures.  Recommendations-advance diet as tolerated

## 2013-09-19 NOTE — Progress Notes (Signed)
PATIENT DETAILS Name: Wendy [name] Age: 63 y.o. Sex: female Date of Birth: 01/27/1951 Admit Date: 09/15/2013 Admitting Physician Bynum Bellows, MD [facility]:WEXH, Marye Round, PA-C  Subjective: Not happy about pain meds/anxiety/not being able to eat   Assessment/Plan:  Atrial fib with RVR- converted back to sinus -seen by cards -given dig    Cholecystitis, acute -S/p laparoscopic converted to open cholecystectomy on 7/13, cholangiogram suggestive of CBD stone.  -Diet NPO for now pending GI evaluation for possible ERCP -c/w PCA for pain control, Gen surgery managing post op issues. -cipro IV- change to PO when able to take oral  Suspected Choledocholithiasis -seen on intra-operative cholangiogram-seen by GI-ERCP on 7/15   Hypertension  -Stable.  -c/w Metoprolol and Dyazide   Hyperlipidemia  -Continue statin.   COPD  -Stable at this time. Continue home inhalers.   Obstructive sleep apnea  -Continue CPAP at bedtime.   GERD  -Continue PPI.   Anxiety  -Benzodiazepines as needed  Peripheral artery disease  -Stable.   Disposition:  Remain inpatient  DVT Prophylaxis:  Prophylactic Lovenox  Code Status:  Full code    Transfer to tele bed  Procedures: LAPAROSCOPIC CHOLECYSTECTOMY CONVERTED TO OPEN CHOLECYSTECTOMY WITH CHOLANGIOGRAM  CONSULTS:  GI and general surgery  Time spent 25 min   MEDICATIONS: Scheduled Meds: . [facility] HOLD] aspirin EC  81 mg Oral Daily  . [facility] HOLD] ciprofloxacin  400 mg Intravenous Q12H  . [MAR HOLD] enoxaparin (LOVENOX) injection  40 mg Subcutaneous Q24H  . [facility] HOLD] ezetimibe  10 mg Oral Daily  . [MAR HOLD] fluticasone  1 puff Inhalation BID  . [facility] HOLD] loratadine  10 mg Oral Daily  . [facility] HOLD] metoprolol  100 mg Oral BID  . [MAR HOLD] mometasone-formoterol  2 puff Inhalation BID  . [facility] HOLD] pantoprazole  80 mg Oral BID  . [facility] HOLD] simvastatin  20 mg Oral q1800  . [facility] HOLD] tiotropium  18 mcg Inhalation Daily    Continuous Infusions: . sodium chloride 75 mL/hr at 09/19/13 0951   PRN Meds:.[MAR HOLD] acetaminophen, [MAR HOLD] acetaminophen, [MAR HOLD] albuterol, [MAR HOLD] ALPRAZolam, [MAR HOLD] LORazepam, [MAR HOLD] methocarbamol, [MAR HOLD]  morphine injection, [MAR HOLD] ondansetron (ZOFRAN) IV, [MAR HOLD] ondansetron  Antibiotics: Anti-infectives   Start     Dose/Rate Route Frequency Ordered Stop   09/15/13 2130  [MAR Hold]  ciprofloxacin (CIPRO) IVPB 400 mg     (On MAR Hold since 09/19/13 1149)   400 mg 200 mL/hr over 60 Minutes Intravenous Every 12 hours 09/15/13 2120     09/15/13 1815  ciprofloxacin (CIPRO) IVPB 400 mg     400 mg 200 mL/hr over 60 Minutes Intravenous  Once 09/15/13 1812 09/15/13 2110       PHYSICAL EXAM: Vital signs in last 24 hours: Filed Vitals:   09/18/13 2000 09/18/13 2149 09/19/13 0524 09/19/13 0822  BP: 120/53 101/52 115/65   Pulse: 71  75 80  Temp: 97.1 F (36.2 C)  98 F (36.7 C)   TempSrc:   Oral   Resp: 16  20 20   Height:      Weight:   89.9 kg (198 lb 3.1 oz)   SpO2: 99%  93% 95%    Weight change: -4.7 kg (-10 lb 5.8 oz) Filed Weights   09/17/13 0557 09/18/13 0534 09/19/13 0524  Weight: 89.1 kg (196 lb 6.9 oz) 94.6 kg (208 lb 8.9 oz) 89.9 kg (198 lb 3.1 oz)  Body mass index is 36.24 kg/(m^2).   Gen Exam: Awake and alert with clear speech.   Neck: Supple, No JVD.   Chest: B/L Clear.   CVS: S1 S2 Regular, no murmurs.  Abdomen: Abd: Soft, appropriately tender, incision is covered and dressings are clean, dry, and intact. She has active bowel sounds. Her JP drain is serous sanguinous. Extremities: no edema, lower extremities warm to touch. Neurologic: Non Focal.   Skin: No Rash.   Wounds: Cholecystectomy incision  Intake/Output from previous day:  Intake/Output Summary (Last 24 hours) at 09/19/13 1202 Last data filed at 09/19/13 0856  Gross per 24 hour  Intake      0 ml  Output    780 ml  Net   -780 ml     LAB  RESULTS: CBC  Recent Labs Lab 09/15/13 1302 09/16/13 0504 09/17/13 0105 09/18/13 0028 09/19/13 0612  WBC 10.5 11.8* 11.5* 8.7 5.5  HGB 12.5 11.2* 9.8* 9.3* 8.7*  HCT 38.3 35.2* 31.0* 29.3* 27.8*  PLT 255 236 251 241 189  MCV 91.8 93.9 93.7 94.5 93.9  MCH 30.0 29.9 29.6 30.0 29.4  MCHC 32.6 31.8 31.6 31.7 31.3  RDW 13.9 14.1 13.9 14.0 13.6  LYMPHSABS 1.1  --   --   --   --   MONOABS 0.8  --   --   --   --   EOSABS 0.0  --   --   --   --   BASOSABS 0.0  --   --   --   --     Chemistries   Recent Labs Lab 09/15/13 1302 09/16/13 0504 09/17/13 0105 09/18/13 0020 09/19/13 0615  NA 143 138 135* 134* 140  K 3.5* 3.7 4.3 3.8 3.8  CL 99 98 96 95* 97  CO2 23 25 23 22 28   GLUCOSE 111* 116* 121* 114* 91  BUN 16 13 15 19 10   CREATININE 0.69 0.69 0.95 1.03 0.73  CALCIUM 9.6 8.7 8.4 8.3* 8.6    CBG: No results found for this basename: GLUCAP,  in the last 168 hours  GFR Estimated Creatinine Clearance: 76.9 ml/min (by C-G formula based on Cr of 0.73).  Coagulation profile No results found for this basename: INR, PROTIME,  in the last 168 hours  Cardiac Enzymes No results found for this basename: CK, CKMB, TROPONINI, MYOGLOBIN,  in the last 168 hours  No components found with this basename: POCBNP,  No results found for this basename: DDIMER,  in the last 72 hours No results found for this basename: HGBA1C,  in the last 72 hours No results found for this basename: CHOL, HDL, LDLCALC, TRIG, CHOLHDL, LDLDIRECT,  in the last 72 hours  Recent Labs  09/19/13 0950  TSH 0.517   No results found for this basename: VITAMINB12, FOLATE, FERRITIN, TIBC, IRON, RETICCTPCT,  in the last 72 hours No results found for this basename: LIPASE, AMYLASE,  in the last 72 hours  Urine Studies No results found for this basename: UACOL, UAPR, USPG, UPH, UTP, UGL, UKET, UBIL, UHGB, UNIT, UROB, ULEU, UEPI, UWBC, URBC, UBAC, CAST, CRYS, UCOM, BILUA,  in the last 72  hours  MICROBIOLOGY: Recent Results (from the past 240 hour(s))  CULTURE, BLOOD (ROUTINE X 2)     Status: None   Collection Time    09/17/13 11:00 PM      Result Value Ref Range Status   Specimen Description BLOOD RIGHT HAND   Final   Special Requests BOTTLES DRAWN  AEROBIC ONLY 5CC   Final   Culture  Setup Time     Final   Value: 09/18/2013 08:46     Performed at [facility]   Culture     Final   Value:        BLOOD CULTURE RECEIVED NO GROWTH TO DATE CULTURE WILL BE HELD FOR 5 DAYS BEFORE ISSUING A FINAL NEGATIVE REPORT     Performed at [facility]   Report Status PENDING   Incomplete  CULTURE, BLOOD (ROUTINE X 2)     Status: None   Collection Time    09/17/13 11:05 PM      Result Value Ref Range Status   Specimen Description BLOOD RIGHT HAND   Final   Special Requests BOTTLES DRAWN AEROBIC ONLY 10CC   Final   Culture  Setup Time     Final   Value: 09/18/2013 08:45     Performed at [facility]   Culture     Final   Value:        BLOOD CULTURE RECEIVED NO GROWTH TO DATE CULTURE WILL BE HELD FOR 5 DAYS BEFORE ISSUING A FINAL NEGATIVE REPORT     Performed at [facility]   Report Status PENDING   Incomplete    RADIOLOGY STUDIES/RESULTS: Dg Chest 2 View  08/26/2013   CLINICAL DATA:  Preoperative chest x-ray prior to left humeral repair  EXAM: CHEST  2 VIEW  COMPARISON:  Prior chest x-ray 07/19/2013  FINDINGS: Cardiac and mediastinal contours remain within normal limits. Patient is status post median sternotomy with evidence of multivessel CABG including LIMA bypass. Metallic stents project over the right and left anterior descending coronary arteries. The lungs are clear. No pleural effusion or pneumothorax. Incompletely imaged left mid humeral fracture. No acute osseous abnormality.  IMPRESSION: No active cardiopulmonary disease.  Evidence of prior percutaneous coronary artery stenting and multivessel CABG suggests a significant past history of  coronary artery disease.   Electronically Signed   By: Jacqulynn Cadet M.D.   On: 08/26/2013 14:14   Dg Cholangiogram Operative  09/17/2013   CLINICAL DATA:  Cholecystectomy for cholecystitis.  EXAM: INTRAOPERATIVE CHOLANGIOGRAM  TECHNIQUE: Cholangiographic images from the C-arm fluoroscopic device were submitted for interpretation post-operatively. Please see the procedural report for the amount of contrast and the fluoroscopy time utilized.  COMPARISON:  None.  FINDINGS: Filling defects introduced into the common bile duct are likely related air bubbles. There is no evidence of biliary obstruction with contrast entering the duodenum. Visualized intrahepatic ducts are unremarkable.  IMPRESSION: Filling defects visualized in the CBD likely relate to air bubbles.   Electronically Signed   By: Aletta Edouard M.D.   On: 09/17/2013 08:09   US Abdomen Complete  09/15/2013   CLINICAL DATA:  Abdominal pain  EXAM: ULTRASOUND ABDOMEN COMPLETE  COMPARISON:  CT abdomen and pelvis September 15, 2013  FINDINGS: Gallbladder:  Sludge is seen within the gallbladder. There are no echogenic foci which move and shadowing consistent with gallstones. The gallbladder wall is thickened at 5 mm in thickness. There is no gallbladder wall edema or pericholecystic fluid. No sonographic Murphy sign noted.  Common bile duct:  Diameter: 6 mm. There is no intrahepatic, common hepatic, common bile duct dilatation.  Liver:  No focal lesion identified. Liver echogenicity is diffusely increased.  IVC:  No abnormality visualized.  Pancreas:  Visualized portion unremarkable. Portions of the pancreas were obscured by gas.  Spleen:  Size and appearance within  normal limits.  Right Kidney:  Length: 10.2 cm. Echogenicity within normal limits. No mass or hydronephrosis visualized.  Left Kidney:  Length: 10.8 cm. Echogenicity within normal limits. No mass or hydronephrosis visualized. There is scarring in the lower pole region  Abdominal aorta:  No  aneurysm visualized.  Portions of the aorta are obscured by gas.  Other findings:  No demonstrable ascites.  IMPRESSION: Gallbladder wall is thickened. There is no gallbladder wall edema or pericholecystic fluid, however. There is sludge in the gallbladder but no appreciable gallstones. The wall thickening could indicate a degree of acalculus cholecystitis.  Portions of the pancreas and abdominal aorta are obscured by gas. Visualized portions of these structures appear normal.  Increased echogenicity liver consistent with fatty change. While no focal liver lesions are identified, it must be cautioned that the sensitivity ofultrasound for focal liver lesions is diminished in this circumstance.   Electronically Signed   By: Lowella Grip M.D.   On: 09/15/2013 17:13   Ct Abdomen Pelvis W Contrast  09/15/2013   CLINICAL DATA:  Intermittent mid and upper abdominal pain beginning 1 week ago.  EXAM: CT ABDOMEN AND PELVIS WITH CONTRAST  TECHNIQUE: Multidetector CT imaging of the abdomen and pelvis was performed using the standard protocol following bolus administration of intravenous contrast.  CONTRAST:  100 mL OMNIPAQUE IOHEXOL 300 MG/ML  SOLN  COMPARISON:  Aortic ultrasound 06/05/2013.  FINDINGS: Mild dependent atelectasis is seen in the lung bases. No pleural or pericardial effusion.  The liver is diffusely low attenuating consistent with fatty infiltration. No focal liver lesion is seen. The gallbladder is mildly dilated and there is some stranding about the gallbladder. The pancreas, adrenal glands, spleen and kidneys appear normal.  Fem-fem bypass graft is identified and opacifies with contrast material. There is aortoiliac atherosclerosis without aneurysm. 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 Left common iliac artery also may be occluded with reconstitution of the internal and external iliacs.  The stomach, small bowel and appendix appear normal. No lymphadenopathy is identified.   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 No femoral head fragmentation or collapse is identified. Lumbar spondylosis is noted.  IMPRESSION: Dilated gallbladder with pericholecystic stranding worrisome for cholecystitis. Right upper quadrant ultrasound would be useful further evaluation.  Diffuse fatty infiltration of the liver.  Extensive vascular disease as detailed above. Fem-fem bypass graft appears patent.  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   Electronically Signed   By: Inge Rise M.D.   On: 09/15/2013 15:16   Dg Humerus Left  08/27/2013   CLINICAL DATA:  Postop  EXAM: LEFT HUMERUS - 2+ VIEW  COMPARISON:  Intraoperative C-arm spot films  FINDINGS: Plate and screw fixation of the comminuted fracture of the mid proximal left humerus is noted. The fixation plate is in good position. No complicating features are seen.  IMPRESSION: Fixation of mid proximal comminuted left humeral fracture.   Electronically Signed   By: Ivar Drape M.D.   On: 08/27/2013 15:27   Dg Humerus Left  08/27/2013   CLINICAL DATA:  Humerus fracture.  EXAM: LEFT HUMERUS - 2+ VIEW  COMPARISON:  None.  FINDINGS: Patient status post open reduction internal fixation of comminuted fracture of the left humerus. Evidence of prior plate and screw fixation noted. This fracture may be old with nonunion. Repeat fracture at site of prior fracture.  IMPRESSION: Open reduction internal fixation left humeral  fracture described above.   Electronically Signed   By: Marcello Moores  Register   On: 08/27/2013 12:44    Eulogio Bear,  [facility] Pager:[phone]  If 7PM-7AM, please contact night-coverage [email or web address] Password [email or web address] 09/19/2013, 12:02 PM   LOS: 4 days   **Disclaimer: This note may have been dictated with voice recognition software. Similar sounding words can inadvertently be transcribed and this note may contain transcription  errors which may not have been corrected upon publication of note.**

## 2013-09-19 NOTE — Progress Notes (Signed)
Misty on [address] [place] made aware that pt is now in atrial fibrillation/flutter and will possible be treated with anticoagulants 24 hours after the procedure. Clemmie Krill, RN

## 2013-09-19 NOTE — Progress Notes (Signed)
Spoke with both CARDS and GI about pts pain issues. No new orders received.

## 2013-09-19 NOTE — Progress Notes (Signed)
Day of Surgery  Subjective: She says her back hurts, her stomach hurts, her throat hurts, and her heart is jumping all over.  She is back from ERCP, but has been down there all day. Drainage is brownish and clear.   Objective: Vital signs in last 24 hours: Temp:  [97.1 F (36.2 C)-98 F (36.7 C)] 98 F (36.7 C) (07/16 0524) Pulse Rate:  [71-84] 75 (07/16 1228) Resp:  [13-20] 13 (07/16 1228) BP: (94-120)/(42-65) 109/44 mmHg (07/16 1228) SpO2:  [93 %-99 %] 98 % (07/16 1228) Weight:  [89.9 kg (198 lb 3.1 oz)] 89.9 kg (198 lb 3.1 oz) (07/16 0524) Last BM Date: 09/18/13 Npo Afebrile, VSS Labs OK, ongoing anemia IOC:  Filling defects introduced into the common bile duct are likely related air bubbles. There is no evidence of biliary obstruction with contrast entering the duodenum. Visualized intrahepatic ducts are unremarkable.   Intake/Output from previous day: 07/15 0701 - 07/16 0700 In: 0  Out: 780 [Urine:750; Drains:30] Intake/Output this shift:    General appearance: alert, cooperative and no distress Resp: clear to auscultation bilaterally and anterior GI: soft sore, sites look fine, the  incisions are fine.  She has bowel sounds and wants some coffee.  Lab Results:   Recent Labs  09/18/13 0028 09/19/13 0612  WBC 8.7 5.5  HGB 9.3* 8.7*  HCT 29.3* 27.8*  PLT 241 189    BMET  Recent Labs  09/18/13 0020 09/19/13 0615  NA 134* 140  K 3.8 3.8  CL 95* 97  CO2 22 28  GLUCOSE 114* 91  BUN 19 10  CREATININE 1.03 0.73  CALCIUM 8.3* 8.6   PT/INR No results found for this basename: LABPROT, INR,  in the last 72 hours   Recent Labs Lab 09/15/13 1302 09/16/13 0504 09/17/13 0105 09/18/13 0020 09/19/13 0615  AST 18 19 176* 81* 36  ALT 22 22 138* 105* 67*  ALKPHOS 98 89 87 99 100  BILITOT 0.7 0.6 0.8 0.6 0.7  PROT 7.7 6.8 6.3 6.2 6.1  ALBUMIN 3.5 2.8* 2.4* 2.3* 2.2*     Lipase     Component Value Date/Time   LIPASE 9* 09/15/2013 1302      Studies/Results: No results found.  Medications: . [facility] HOLD] aspirin EC  81 mg Oral Daily  . [facility] HOLD] ciprofloxacin  400 mg Intravenous Q12H  . [MAR HOLD] enoxaparin (LOVENOX) injection  40 mg Subcutaneous Q24H  . [facility] HOLD] ezetimibe  10 mg Oral Daily  . [MAR HOLD] fluticasone  1 puff Inhalation BID  . [facility] HOLD] loratadine  10 mg Oral Daily  . [facility] HOLD] metoprolol  100 mg Oral BID  . [MAR HOLD] mometasone-formoterol  2 puff Inhalation BID  . [facility] HOLD] pantoprazole  80 mg Oral BID  . [facility] HOLD] simvastatin  20 mg Oral q1800  . [MAR HOLD] tiotropium  18 mcg Inhalation Daily    Assessment/Plan status post laparoscopic cholecystectomy  choledocholithiasis anxiety disorder Atrial  Fibrillation with RVR CAD/s/p CABG COPD Sleep apnea Chronic back and leg pain Anxiety peripheral neuropathy Post op anemia Body mass index is 36.24 kg/(m^2).   Plan:  I think she is doing fine from her surgery.  Labs ordered for Am.  Her heart rate is about 77 right now and sounds regular.  We will advance her diet tomorrow if she does well. Restart lovenox tomorrow, SCD for DVT.  LOS: 4 days    Raghav Verrilli 09/19/2013

## 2013-09-19 NOTE — Progress Notes (Signed)
I was notified by the endoscopy nurse that the patient had gone back into atrial flutter fibrillation with rapid ventricular rate after the endoscopy procedure.  She is n.p.o.  She is not having any chest discomfort or significant dyspnea but she does feel anxious.  On examination her lungs are clear.  The heart reveals no gallop. Abdomen reveals quiet bowel sounds Extremities negative Plan: Start IV Cardizem drip while she is still n.p.o. Wait 24 hours before starting anticoagulation as per recommendation from Dr. Deatra Ina.

## 2013-09-19 NOTE — H&P (View-Only) (Signed)
I have reviewed the [place] again with radiology.  We are both very suspicious that there are retained bile duct stones.  In addition, there is some narrowing in the midportion of the common bile duct suspicious for stricture.  Plan ERCP today

## 2013-09-19 NOTE — Progress Notes (Signed)
PT Cancellation Note  Patient Details Name: Wendy [name] MRN: [id number] DOB: [birth year]   Cancelled Treatment:    Reason Eval/Treat Not Completed: Patient declined, no reason specified Pt declined participating in therapy reporting pain and "I just don't feel like it." RN aware as pt requesting pain medication every 2 hours.Educated pt on importance of mobility to prevent detrimental effects of bedrest, however pt refused. Pt to go down for ECRP at noon today. Will follow up as time allows.   Candy Sledge A 09/19/2013, 10:10 AM Candy Sledge, PT, DPT [phone]

## 2013-09-19 NOTE — Progress Notes (Signed)
While pt was in Endoscopy Recovery at approximately 1600 pt's HR increased to the 140's and pt was in atrial fibrillation. HR is now fluctuating between 90-140 going between atrial flutter and atrial fibrillation. Pt denies shortness of breath or chest pain but does state that it feels like her " heart is taking off, shaking her insides." B/P 122/64 at 1600 and 111/53 @ 1606, SpO2 99% at 1600 and 97% at 1606 on  2L. Dr. Deatra Ina, Dr. Ermalene Postin and Dr. Mare Ferrari all made aware. Per Dr. Mare Ferrari it is ok to send pt back to [facility] [place] and pt's atrial fibrillation will be managed there. Per Dr. Deatra Ina, the pt needs to wait 24 hours after procedure prior to being started on any anticoagulants. Dr. Mare Ferrari aware. Clemmie Krill, RN

## 2013-09-19 NOTE — Progress Notes (Signed)
          Daily Rounding Note  09/19/2013, 8:32 AM  LOS: 4 days   SUBJECTIVE:       Still with stabbing, shooting pains.   No nausea.  Has not yet eaten.   OBJECTIVE:         Vital signs in last 24 hours:    Temp:  [97.1 F (36.2 C)-99.2 F (37.3 C)] 98 F (36.7 C) (07/16 0524) Pulse Rate:  [71-146] 80 (07/16 0822) Resp:  [11-20] 20 (07/16 0822) BP: (90-120)/(39-65) 115/65 mmHg (07/16 0524) SpO2:  [90 %-99 %] 95 % (07/16 0822) Weight:  [89.9 kg (198 lb 3.1 oz)] 89.9 kg (198 lb 3.1 oz) (07/16 0524) Last BM Date: 09/18/13 General: obese, does not look ill.   Heart: RRR Chest: clear bil.  No cough or labored breathing Abdomen: soft, hypoactive BS, tender without guarding in right upper abdomen and less so in remainder of belly  Extremities: no CCE Neuro/Psych:  Alert, not confused.   Intake/Output from previous day: 07/15 0701 - 07/16 0700 In: 0  Out: 780 [Urine:750; Drains:30]  Intake/Output this shift:    Lab Results:  Recent Labs  09/17/13 0105 09/18/13 0028 09/19/13 0612  WBC 11.5* 8.7 5.5  HGB 9.8* 9.3* 8.7*  HCT 31.0* 29.3* 27.8*  PLT 251 241 189   BMET  Recent Labs  09/17/13 0105 09/18/13 0020 09/19/13 0615  NA 135* 134* 140  K 4.3 3.8 3.8  CL 96 95* 97  CO2 23 22 28   GLUCOSE 121* 114* 91  BUN 15 19 10   CREATININE 0.95 1.03 0.73  CALCIUM 8.4 8.3* 8.6   LFT  Recent Labs  09/17/13 0105 09/18/13 0020 09/19/13 0615  PROT 6.3 6.2 6.1  ALBUMIN 2.4* 2.3* 2.2*  AST 176* 81* 36  ALT 138* 105* 67*  ALKPHOS 87 99 100  BILITOT 0.8 0.6 0.7   Scheduled Meds: . aspirin EC  81 mg Oral Daily  . ciprofloxacin  400 mg Intravenous Q12H  . enoxaparin (LOVENOX) injection  40 mg Subcutaneous Q24H  . ezetimibe  10 mg Oral Daily  . fluticasone  1 puff Inhalation BID  . loratadine  10 mg Oral Daily  . metoprolol  100 mg Oral BID  . mometasone-formoterol  2 puff Inhalation BID  . pantoprazole  80 mg  Oral BID  . simvastatin  20 mg Oral q1800  . tiotropium  18 mcg Inhalation Daily   Continuous Infusions: . sodium chloride 75 mL/hr at 09/18/13 1623   PRN Meds:.acetaminophen, acetaminophen, albuterol, ALPRAZolam, LORazepam, methocarbamol, morphine injection, ondansetron (ZOFRAN) IV, ondansetron  ASSESMENT:   *   Abnormal transaminases post lap converted to open chole 7/13.  ? CBD stone and bile duct stricture on IOC. LFTs now near or at normal.  *  Anemia, normocytic. Post op phenomena.  Hgb ~ 12 on admssion and in June 2015.   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   *  Chronic plavix for hx cardiac DES  *  COPD, OSA, PAD, s/p CABG 1998, angioplasty sep and Dec 2013, MI, chronic pain, obesity (BMI 35)   PLAN   *  ERCP today.  Will need post procedure rectal indocin. cipro IV q 12 hours on board so no need of preop abx.     Azucena Freed  09/19/2013, 8:32 AM Pager: [phone]

## 2013-09-20 ENCOUNTER — Encounter (HOSPITAL_COMMUNITY): Payer: Self-pay | Admitting: Gastroenterology

## 2013-09-20 DIAGNOSIS — J9600 Acute respiratory failure, unspecified whether with hypoxia or hypercapnia: Secondary | ICD-10-CM | POA: Diagnosis present

## 2013-09-20 DIAGNOSIS — I059 Rheumatic mitral valve disease, unspecified: Secondary | ICD-10-CM

## 2013-09-20 LAB — CBC
HCT: 25.2 % — ABNORMAL LOW (ref 36.0–46.0)
Hemoglobin: 8 g/dL — ABNORMAL LOW (ref 12.0–15.0)
MCH: 29.6 pg (ref 26.0–34.0)
MCHC: 31.7 g/dL (ref 30.0–36.0)
MCV: 93.3 fL (ref 78.0–100.0)
Platelets: 205 10*3/uL (ref 150–400)
RBC: 2.7 MIL/uL — ABNORMAL LOW (ref 3.87–5.11)
RDW: 13.6 % (ref 11.5–15.5)
WBC: 4.2 10*3/uL (ref 4.0–10.5)

## 2013-09-20 LAB — COMPREHENSIVE METABOLIC PANEL
ALT: 60 U/L — ABNORMAL HIGH (ref 0–35)
AST: 57 U/L — ABNORMAL HIGH (ref 0–37)
Albumin: 2 g/dL — ABNORMAL LOW (ref 3.5–5.2)
Alkaline Phosphatase: 101 U/L (ref 39–117)
Anion gap: 14 (ref 5–15)
BUN: 8 mg/dL (ref 6–23)
CO2: 28 mEq/L (ref 19–32)
Calcium: 7.8 mg/dL — ABNORMAL LOW (ref 8.4–10.5)
Chloride: 97 mEq/L (ref 96–112)
Creatinine, Ser: 0.75 mg/dL (ref 0.50–1.10)
GFR calc Af Amer: >90 mL/min (ref >90)
GFR calc non Af Amer: 90 mL/min — ABNORMAL LOW (ref >90)
Glucose, Bld: 91 mg/dL (ref 70–99)
Potassium: 4 mEq/L (ref 3.7–5.3)
Sodium: 139 mEq/L (ref 137–147)
Total Bilirubin: 0.6 mg/dL (ref 0.3–1.2)
Total Protein: 5.5 g/dL — ABNORMAL LOW (ref 6.0–8.3)

## 2013-09-20 LAB — LIPASE, BLOOD: Lipase: 8 U/L — ABNORMAL LOW (ref 11–59)

## 2013-09-20 MED ORDER — OXYCODONE-ACETAMINOPHEN 5-325 MG PO TABS
1.0000 | ORAL_TABLET | ORAL | Status: DC | PRN
  Administered 2013-09-20 – 2013-09-21 (×3): 2 via ORAL
  Filled 2013-09-20 (×3): qty 2

## 2013-09-20 MED ORDER — ATORVASTATIN CALCIUM 10 MG PO TABS
10.0000 mg | ORAL_TABLET | Freq: Every day | ORAL | Status: DC
  Administered 2013-09-20 – 2013-09-21 (×2): 10 mg via ORAL
  Filled 2013-09-20 (×2): qty 1

## 2013-09-20 MED ORDER — ALPRAZOLAM 0.5 MG PO TABS
1.0000 mg | ORAL_TABLET | Freq: Every day | ORAL | Status: DC | PRN
  Administered 2013-09-20 – 2013-09-21 (×2): 1 mg via ORAL
  Filled 2013-09-20 (×2): qty 2

## 2013-09-20 MED ORDER — DILTIAZEM HCL 60 MG PO TABS
60.0000 mg | ORAL_TABLET | Freq: Four times a day (QID) | ORAL | Status: DC
  Administered 2013-09-20 – 2013-09-21 (×5): 60 mg via ORAL
  Filled 2013-09-20 (×10): qty 1

## 2013-09-20 NOTE — Progress Notes (Signed)
I have seen and examined the patient and agree with the assessment and plans. Will probably remove drain prior to discharge  [place]. [name]  MD, FACS

## 2013-09-20 NOTE — Progress Notes (Signed)
Cardiologist on call notified of pt's rhythm change back into NSR. New orders to d/c cardizem drip & start the pt on PO cardizem 60mg  q 6hrs. Will continue to monitor the pt. Hoover Brunette, RN

## 2013-09-20 NOTE — Progress Notes (Signed)
Patient ID: Wendy [name], female   DOB: 14-Dec-1950, 63 y.o.   MRN: [id number] 1 Day Post-Op  Subjective: Complain of some soreness this morning. Tolerating clear liquids. Wanting more to eat. Patient desats to mid 80s on room air  Objective: Vital signs in last 24 hours: Temp:  [98.2 F (36.8 C)-99.6 F (37.6 C)] 98.4 F (36.9 C) (07/17 0426) Pulse Rate:  [66-112] 66 (07/17 0426) Resp:  [11-22] 16 (07/17 0426) BP: (92-134)/(37-64) 103/37 mmHg (07/17 0637) SpO2:  [93 %-100 %] 97 % (07/17 0914) Weight:  [198 lb 10.2 oz (90.1 kg)] 198 lb 10.2 oz (90.1 kg) (07/17 0426) Last BM Date: 09/20/13  Intake/Output from previous day: 07/16 0701 - 07/17 0700 In: 1161.7 [P.O.:360; I.V.:801.7] Out: 345 [Urine:325; Drains:20] Intake/Output this shift:    PE: Abd: Soft, appropriately tender, nondistended, and incisions are all clean, dry, and intact with staples present. JP drain is dark serous sanguinous, likely from Surgicel Snow.  Lab Results:   Recent Labs  09/19/13 0612 09/20/13 0330  WBC 5.5 4.2  HGB 8.7* 8.0*  HCT 27.8* 25.2*  PLT 189 205   BMET  Recent Labs  09/19/13 0615 09/20/13 0330  NA 140 139  K 3.8 4.0  CL 97 97  CO2 28 28  GLUCOSE 91 91  BUN 10 8  CREATININE 0.73 0.75  CALCIUM 8.6 7.8*   PT/INR No results found for this basename: LABPROT, INR,  in the last 72 hours CMP     Component Value Date/Time   NA 139 09/20/2013 0330   K 4.0 09/20/2013 0330   CL 97 09/20/2013 0330   CO2 28 09/20/2013 0330   GLUCOSE 91 09/20/2013 0330   BUN 8 09/20/2013 0330   CREATININE 0.75 09/20/2013 0330   CALCIUM 7.8* 09/20/2013 0330   PROT 5.5* 09/20/2013 0330   ALBUMIN 2.0* 09/20/2013 0330   AST 57* 09/20/2013 0330   ALT 60* 09/20/2013 0330   ALKPHOS 101 09/20/2013 0330   BILITOT 0.6 09/20/2013 0330   GFRNONAA 90* 09/20/2013 0330   GFRAA >90 09/20/2013 0330   Lipase     Component Value Date/Time   LIPASE 8* 09/20/2013 0330       Studies/Results: Dg Ercp Biliary &  Pancreatic Ducts  09/19/2013   CLINICAL DATA:  Possible common bile duct stone  EXAM: ERCP  TECHNIQUE: Multiple spot images obtained with the fluoroscopic device and submitted for interpretation post-procedure.  COMPARISON:  09/16/2013  FINDINGS: Multiple spot films were obtained during in the ERCP. Transient filling defects are noted consistent with injected air bubbles. No definitive stone is identified. Correlation with the findings during the procedure is recommended.  IMPRESSION: These images were submitted for radiologic interpretation only. Please see the procedural report for the amount of contrast and the fluoroscopy time utilized.   Electronically Signed   By: Inez Catalina M.D.   On: 09/19/2013 15:30    Anti-infectives: Anti-infectives   Start     Dose/Rate Route Frequency Ordered Stop   09/15/13 2130  ciprofloxacin (CIPRO) IVPB 400 mg     400 mg 200 mL/hr over 60 Minutes Intravenous Every 12 hours 09/15/13 2120     09/15/13 1815  ciprofloxacin (CIPRO) IVPB 400 mg     400 mg 200 mL/hr over 60 Minutes Intravenous  Once 09/15/13 1812 09/15/13 2110       Assessment/Plan   1. POD 4, status post laparoscopic cholecystectomy  2. Choledocholithiasis, status post negative ERCP  3 anxiety disorder  Plan: 1.  We'll advance to a regular low-fat diet today. 2. Given the patient desats to the mid 80s consistently on room air, I have encouraged the patient to do aggressive pulmonary toileting with using her incentive spirometer at least 10 times an hour while awake. 3. I have discontinued her antibiotics that she does not need any further antibiotic therapy. 4. Continue JP drain for now. 5. Oral narcotics for pain meds as needed.   LOS: 5 days    Carold Eisner E 09/20/2013, 9:53 AM Pager: [phone]

## 2013-09-20 NOTE — Progress Notes (Addendum)
Patient Name: Wendy [name] Date of Encounter: 09/20/2013     Principal Problem:   Cholecystitis, acute Active Problems:   CAD (coronary artery disease)   Hypertension   HLD (hyperlipidemia)   PAD (peripheral artery disease)   COPD (chronic obstructive pulmonary disease)   Chronic back pain   Sleep apnea   Anxiety   GERD (gastroesophageal reflux disease)   Nonspecific abnormal results of liver function study    SUBJECTIVE  Feels better than yesterday. Had 1 BM this morning. Abd pain tolerable. Denies any chest discomfort or SOB. Had a-fib with RVR yesterday after ERCP, placed on IV dilt, and transitioned to PO dilt  CURRENT MEDS . aspirin EC  81 mg Oral Daily  . ciprofloxacin  400 mg Intravenous Q12H  . diltiazem  60 mg Oral 4 times per day  . enoxaparin (LOVENOX) injection  40 mg Subcutaneous Q24H  . ezetimibe  10 mg Oral Daily  . fluticasone  1 puff Inhalation BID  . loratadine  10 mg Oral Daily  . metoprolol  100 mg Oral BID  . mometasone-formoterol  2 puff Inhalation BID  . pantoprazole  80 mg Oral BID  . simvastatin  20 mg Oral q1800  . tiotropium  18 mcg Inhalation Daily    OBJECTIVE  Filed Vitals:   09/20/13 0254 09/20/13 0426 09/20/13 0637 09/20/13 0914  BP: 100/49 93/39 103/37   Pulse:  66    Temp:  98.4 F (36.9 C)    TempSrc:  Oral    Resp:  16    Height:      Weight:  198 lb 10.2 oz (90.1 kg)    SpO2:  93%  97%    Intake/Output Summary (Last 24 hours) at 09/20/13 0943 Last data filed at 09/20/13 0253  Gross per 24 hour  Intake 1161.67 ml  Output    345 ml  Net 816.67 ml   Filed Weights   09/18/13 0534 09/19/13 0524 09/20/13 0426  Weight: 208 lb 8.9 oz (94.6 kg) 198 lb 3.1 oz (89.9 kg) 198 lb 10.2 oz (90.1 kg)    PHYSICAL EXAM  General: Pleasant, NAD. Neuro: Alert and oriented X 3. Moves all extremities spontaneously. Psych: Normal affect. HEENT:  Normal  Neck: Supple without bruits or JVD. Lungs:  Resp regular and unlabored,  anterior exam CTA. Heart: RRR no s3, s4, or murmurs. Abdomen: Soft, non-tender, non-distended, hypoactive bowel sound. RUQ drain with brain clear draianage Extremities: No clubbing, cyanosis or edema. DP/PT/Radials 2+ and equal bilaterally.  Accessory Clinical Findings  CBC  Recent Labs  09/19/13 0612 09/20/13 0330  WBC 5.5 4.2  HGB 8.7* 8.0*  HCT 27.8* 25.2*  MCV 93.9 93.3  PLT 189 277   Basic Metabolic Panel  Recent Labs  09/19/13 0615 09/20/13 0330  NA 140 139  K 3.8 4.0  CL 97 97  CO2 28 28  GLUCOSE 91 91  BUN 10 8  CREATININE 0.73 0.75  CALCIUM 8.6 7.8*   Liver Function Tests  Recent Labs  09/19/13 0615 09/20/13 0330  AST 36 57*  ALT 67* 60*  ALKPHOS 100 101  BILITOT 0.7 0.6  PROT 6.1 5.5*  ALBUMIN 2.2* 2.0*    Recent Labs  09/20/13 0330  LIPASE 8*   Thyroid Function Tests  Recent Labs  09/19/13 0950  TSH 0.517    TELE  NSR with HR 60-70s, no significant bradycardia. A-fib and a-flutter last night.  ECG  5/15 a-flutter with HR 100s  Radiology/Studies  Dg Chest 2 View  08/26/2013   CLINICAL DATA:  Preoperative chest x-ray prior to left humeral repair  EXAM: CHEST  2 VIEW  COMPARISON:  Prior chest x-ray 07/19/2013  FINDINGS: Cardiac and mediastinal contours remain within normal limits. Patient is status post median sternotomy with evidence of multivessel CABG including LIMA bypass. Metallic stents project over the right and left anterior descending coronary arteries. The lungs are clear. No pleural effusion or pneumothorax. Incompletely imaged left mid humeral fracture. No acute osseous abnormality.  IMPRESSION: No active cardiopulmonary disease.  Evidence of prior percutaneous coronary artery stenting and multivessel CABG suggests a significant past history of coronary artery disease.   Electronically Signed   By: Jacqulynn Cadet M.D.   On: 08/26/2013 14:14   Dg Cholangiogram Operative  09/19/2013   ADDENDUM REPORT: 09/19/2013 09:11   ADDENDUM: The original report was by Dr. Aletta Edouard. The following addendum is by Dr. Van Clines:  Dr. Erskine Emery came by the reading room at 9:05 AM on 09/19/2013 and we reviewed this intraoperative cholangiogram.  The filling defects in the common bile duct do bounce around in a manner which can sometimes be seen with gas bubbles, but the subtraction injection has an appearance raising concern that at least one of the filling defects may in fact represent a stone rather than a gas bubble. Also, the distal common hepatic duct and proximal common bile duct never fill well, and this area seems narrowed compared to the rest of the extrahepatic biliary tree. There could be some stricturing or stenosis in this vicinity.  Finally, on the subtraction injection, there is some extravasation of contrast, probably incidental due to a lack of watertight seal at the cystic duct remnant cannulation site rather than discontinuity in another part of the extrahepatic bile duct.   Electronically Signed   By: Sherryl Barters M.D.   On: 09/19/2013 09:11   09/19/2013   CLINICAL DATA:  Cholecystectomy for cholecystitis.  EXAM: INTRAOPERATIVE CHOLANGIOGRAM  TECHNIQUE: Cholangiographic images from the C-arm fluoroscopic device were submitted for interpretation post-operatively. Please see the procedural report for the amount of contrast and the fluoroscopy time utilized.  COMPARISON:  None.  FINDINGS: Filling defects introduced into the common bile duct are likely related air bubbles. There is no evidence of biliary obstruction with contrast entering the duodenum. Visualized intrahepatic ducts are unremarkable.  IMPRESSION: Filling defects visualized in the CBD likely relate to air bubbles.  Electronically Signed: By: Aletta Edouard M.D. On: 09/17/2013 08:09   US Abdomen Complete  09/15/2013   CLINICAL DATA:  Abdominal pain  EXAM: ULTRASOUND ABDOMEN COMPLETE  COMPARISON:  CT abdomen and pelvis September 15, 2013  FINDINGS:  Gallbladder:  Sludge is seen within the gallbladder. There are no echogenic foci which move and shadowing consistent with gallstones. The gallbladder wall is thickened at 5 mm in thickness. There is no gallbladder wall edema or pericholecystic fluid. No sonographic Murphy sign noted.  Common bile duct:  Diameter: 6 mm. There is no intrahepatic, common hepatic, common bile duct dilatation.  Liver:  No focal lesion identified. Liver echogenicity is diffusely increased.  IVC:  No abnormality visualized.  Pancreas:  Visualized portion unremarkable. Portions of the pancreas were obscured by gas.  Spleen:  Size and appearance within normal limits.  Right Kidney:  Length: 10.2 cm. Echogenicity within normal limits. No mass or hydronephrosis visualized.  Left Kidney:  Length: 10.8 cm. Echogenicity within normal limits. No mass or hydronephrosis visualized. There is  scarring in the lower pole region  Abdominal aorta:  No aneurysm visualized.  Portions of the aorta are obscured by gas.  Other findings:  No demonstrable ascites.  IMPRESSION: Gallbladder wall is thickened. There is no gallbladder wall edema or pericholecystic fluid, however. There is sludge in the gallbladder but no appreciable gallstones. The wall thickening could indicate a degree of acalculus cholecystitis.  Portions of the pancreas and abdominal aorta are obscured by gas. Visualized portions of these structures appear normal.  Increased echogenicity liver consistent with fatty change. While no focal liver lesions are identified, it must be cautioned that the sensitivity ofultrasound for focal liver lesions is diminished in this circumstance.   Electronically Signed   By: Lowella Grip M.D.   On: 09/15/2013 17:13   Ct Abdomen Pelvis W Contrast  09/15/2013   CLINICAL DATA:  Intermittent mid and upper abdominal pain beginning 1 week ago.  EXAM: CT ABDOMEN AND PELVIS WITH CONTRAST  TECHNIQUE: Multidetector CT imaging of the abdomen and pelvis was  performed using the standard protocol following bolus administration of intravenous contrast.  CONTRAST:  100 mL OMNIPAQUE IOHEXOL 300 MG/ML  SOLN  COMPARISON:  Aortic ultrasound 06/05/2013.  FINDINGS: Mild dependent atelectasis is seen in the lung bases. No pleural or pericardial effusion.  The liver is diffusely low attenuating consistent with fatty infiltration. No focal liver lesion is seen. The gallbladder is mildly dilated and there is some stranding about the gallbladder. The pancreas, adrenal glands, spleen and kidneys appear normal.  Fem-fem bypass graft is identified and opacifies with contrast material. There is aortoiliac atherosclerosis without aneurysm. 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 Left common iliac artery also may be occluded with reconstitution of the internal and external iliacs.  The stomach, small bowel and appendix appear normal. No lymphadenopathy is identified.  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 No femoral head fragmentation or collapse is identified. Lumbar spondylosis is noted.  IMPRESSION: Dilated gallbladder with pericholecystic stranding worrisome for cholecystitis. Right upper quadrant ultrasound would be useful further evaluation.  Diffuse fatty infiltration of the liver.  Extensive vascular disease as detailed above. Fem-fem bypass graft appears patent.  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   Electronically Signed   By: Inge Rise M.D.   On: 09/15/2013 15:16   Dg Ercp Biliary & Pancreatic Ducts  09/19/2013   CLINICAL DATA:  Possible common bile duct stone  EXAM: ERCP  TECHNIQUE: Multiple spot images obtained with the fluoroscopic device and submitted for interpretation post-procedure.  COMPARISON:  09/16/2013  FINDINGS: Multiple spot films were obtained during in the ERCP. Transient filling defects are noted consistent with injected air  bubbles. No definitive stone is identified. Correlation with the findings during the procedure is recommended.  IMPRESSION: These images were submitted for radiologic interpretation only. Please see the procedural report for the amount of contrast and the fluoroscopy time utilized.   Electronically Signed   By: Inez Catalina M.D.   On: 09/19/2013 15:30   Dg Humerus Left  08/27/2013   CLINICAL DATA:  Postop  EXAM: LEFT HUMERUS - 2+ VIEW  COMPARISON:  Intraoperative C-arm spot films  FINDINGS: Plate and screw fixation of the comminuted fracture of the mid proximal left humerus is noted. The fixation plate is in good position. No complicating features are seen.  IMPRESSION: Fixation of mid proximal comminuted left humeral fracture.   Electronically Signed  By: Ivar Drape M.D.   On: 08/27/2013 15:27   Dg Humerus Left  08/27/2013   CLINICAL DATA:  Humerus fracture.  EXAM: LEFT HUMERUS - 2+ VIEW  COMPARISON:  None.  FINDINGS: Patient status post open reduction internal fixation of comminuted fracture of the left humerus. Evidence of prior plate and screw fixation noted. This fracture may be old with nonunion. Repeat fracture at site of prior fracture.  IMPRESSION: Open reduction internal fixation left humeral fracture described above.   Electronically Signed   By: Marcello Moores  Register   On: 08/27/2013 12:44   Dg C-arm 61-120 Min  09/17/2013   CLINICAL DATA:  Humerus fracture.  EXAM: LEFT HUMERUS - 2+ VIEW  COMPARISON:  None.  FINDINGS: Patient status post open reduction internal fixation of comminuted fracture of the left humerus. Evidence of prior plate and screw fixation noted. This fracture may be old with nonunion. Repeat fracture at site of prior fracture.  IMPRESSION: Open reduction internal fixation left humeral fracture described above.   Electronically Signed   By: Marcello Moores  Register   On: 08/27/2013 12:44    ASSESSMENT AND PLAN  1. Acute cholecystitis s/p open chole 09/17/13   - s/p ERCP 7/16, no  retained stone. S/p sphincterotomy  2. Recurrent PAF  - CHA2DS2-Vasc score 3 (CAD/PAD, HTN, female)  - went back into PAF after ERCP 7/16, potential to start anticoagulation 24 hrs after ERCP  - back in NSR after addition of diltiazem  - HR 60s with combination of PO dilt and metoprolol, will continue that combination for now given her acute illness. Potentially stay on 1 of the 2 medication once her GI issue completely resolve  - continue lovanox  - Likely need systemic anticoagulation, once her transaminase improve and GI issue resolve, may consider novel agent like eliqusi.   - ?if patient need plavix, per patient, her last stent was in Sep and Dec 2013. Would avoid triple therapy in this patient who have higher bleeding risk  3. CAD s/p prior CABG 1998, last PCI 2013, last cath stable anatomy 07/2013   4. PAD with history of RIA occlusion, R femoral thrombectomy, fem-fem BPG   5. Anemia, new, ?post-op related   - hgb 8, down from 11.2 since 4 days ago  6. UTI, per IM   Signed, Almyra Deforest PA-C The patient has had 2 separate episodes of paroxysmal atrial flutter fibrillation during this hospital stay.  Her Chadsvasc score is 3.  She would benefit from anticoagulation.  Apixaban would be a good choice.  I will not start any anticoagulation yet however because of her dropping hemoglobin.  I would like to see her hemoglobin level out before starting apixaban.  I would not plan to restart Plavix.  Her stents were in 2013.  Continue baby aspirin. Her two-dimensional echocardiogram ordered 09/18/13 not yet done.  We will try to get that today to look at her LV function then decide about which AV blocking drug to use long-term. Would lean toward beta blocker since she does have known coronary disease.

## 2013-09-20 NOTE — Progress Notes (Signed)
PATIENT DETAILS Name: Wendy [name] Age: 63 y.o. Sex: female Date of Birth: 1950/10/09 Admit Date: 09/15/2013 Admitting Physician Bynum Bellows, MD LDJ:TTSV, Marye Round, PA-C  CT of abdomen and pelvis was suggestive of cholecystitis an abdominal ultrasound showed gallbladder wall was thickened, sludge was noted but no gallstones. Patient's liver function tests were normal.  S/p cholecystectomy but there was concern about a retained stone.  Patient went for ERCP but unfortunately developed a fib with RVR that converted on its own.  Patient underwent ERCP on 7/16.   Subjective: Feeling better Upset about nursing care   Assessment/Plan:  Atrial fib with RVR- converted back to sinus -followed by cards -given dig and converted but went back during ERCP now on metoprolol and cardizem -defer anticoagulation to cards    Cholecystitis, acute -S/p laparoscopic converted to open cholecystectomy on 7/13, cholangiogram suggestive of CBD stone.  -s/p sphincetotomy by GI -abx d/c'd -surgery plans to remove drain before d/c -currently on heart healthy diet  Acute resp failure -incentive spirometry -wean O2 as tolerated - not wheezing  Suspected Choledocholithiasis -seen on intra-operative cholangiogram-seen by GI-ERCP on 7/16   Hypertension  -Stable.  -c/w Metoprolol and Dyazide   Hyperlipidemia  -Continue statin.   COPD  -Stable at this time. Continue home inhalers.   Obstructive sleep apnea  -Continue CPAP at bedtime.   GERD  -Continue PPI.   Anxiety  -Benzodiazepines as needed  Peripheral artery disease  -Stable.   Disposition:  Remain inpatient  DVT Prophylaxis:  Prophylactic Lovenox  Code Status:  Full code    Transfer to tele bed  Procedures: LAPAROSCOPIC CHOLECYSTECTOMY CONVERTED TO OPEN CHOLECYSTECTOMY WITH CHOLANGIOGRAM  CONSULTS:  GI and general surgery  Time spent 25 min   MEDICATIONS: Scheduled Meds: . aspirin EC  81 mg Oral  Daily  . diltiazem  60 mg Oral 4 times per day  . enoxaparin (LOVENOX) injection  40 mg Subcutaneous Q24H  . ezetimibe  10 mg Oral Daily  . fluticasone  1 puff Inhalation BID  . loratadine  10 mg Oral Daily  . metoprolol  100 mg Oral BID  . mometasone-formoterol  2 puff Inhalation BID  . pantoprazole  80 mg Oral BID  . simvastatin  20 mg Oral q1800  . tiotropium  18 mcg Inhalation Daily   Continuous Infusions: . sodium chloride 75 mL/hr at 09/20/13 0636   PRN Meds:.acetaminophen, acetaminophen, albuterol, ALPRAZolam, LORazepam, methocarbamol, ondansetron (ZOFRAN) IV, ondansetron, oxyCODONE-acetaminophen  Antibiotics: Anti-infectives   Start     Dose/Rate Route Frequency Ordered Stop   09/15/13 2130  ciprofloxacin (CIPRO) IVPB 400 mg  Status:  Discontinued     400 mg 200 mL/hr over 60 Minutes Intravenous Every 12 hours 09/15/13 2120 09/20/13 0956   09/15/13 1815  ciprofloxacin (CIPRO) IVPB 400 mg     400 mg 200 mL/hr over 60 Minutes Intravenous  Once 09/15/13 1812 09/15/13 2110       PHYSICAL EXAM: Vital signs in last 24 hours: Filed Vitals:   09/20/13 0426 09/20/13 0637 09/20/13 0914 09/20/13 1056  BP: 93/39 103/37  104/43  Pulse: 66   70  Temp: 98.4 F (36.9 C)     TempSrc: Oral     Resp: 16     Height:      Weight: 90.1 kg (198 lb 10.2 oz)     SpO2: 93%  97%     Weight change: 0.2 kg (7.1 oz) [employer]  09/18/13 0534 09/19/13 0524 09/20/13 0426  Weight: 94.6 kg (208 lb 8.9 oz) 89.9 kg (198 lb 3.1 oz) 90.1 kg (198 lb 10.2 oz)   Body mass index is 36.32 kg/(m^2).   Gen Exam: Awake and alert with clear speech.    Chest: B/L Clear.   CVS: S1 S2 Regular, no murmurs.  Abdomen: Abd: Soft, appropriately tender, incision is covered and dressings are clean, dry, and intact. She has active bowel sounds. Her JP drain is serous sanguinous. Extremities: no edema, lower extremities warm to touch.   Intake/Output from previous day:  Intake/Output Summary (Last 24  hours) at 09/20/13 1200 Last data filed at 09/20/13 0253  Gross per 24 hour  Intake 1161.67 ml  Output    345 ml  Net 816.67 ml     LAB RESULTS: CBC  Recent Labs Lab 09/15/13 1302 09/16/13 0504 09/17/13 0105 09/18/13 0028 09/19/13 0612 09/20/13 0330  WBC 10.5 11.8* 11.5* 8.7 5.5 4.2  HGB 12.5 11.2* 9.8* 9.3* 8.7* 8.0*  HCT 38.3 35.2* 31.0* 29.3* 27.8* 25.2*  PLT 255 236 251 241 189 205  MCV 91.8 93.9 93.7 94.5 93.9 93.3  MCH 30.0 29.9 29.6 30.0 29.4 29.6  MCHC 32.6 31.8 31.6 31.7 31.3 31.7  RDW 13.9 14.1 13.9 14.0 13.6 13.6  LYMPHSABS 1.1  --   --   --   --   --   MONOABS 0.8  --   --   --   --   --   EOSABS 0.0  --   --   --   --   --   BASOSABS 0.0  --   --   --   --   --     Chemistries   Recent Labs Lab 09/16/13 0504 09/17/13 0105 09/18/13 0020 09/19/13 0615 09/20/13 0330  NA 138 135* 134* 140 139  K 3.7 4.3 3.8 3.8 4.0  CL 98 96 95* 97 97  CO2 25 23 22 28 28   GLUCOSE 116* 121* 114* 91 91  BUN 13 15 19 10 8   CREATININE 0.69 0.95 1.03 0.73 0.75  CALCIUM 8.7 8.4 8.3* 8.6 7.8*    CBG: No results found for this basename: GLUCAP,  in the last 168 hours  GFR Estimated Creatinine Clearance: 77.1 ml/min (by C-G formula based on Cr of 0.75).  Coagulation profile No results found for this basename: INR, PROTIME,  in the last 168 hours  Cardiac Enzymes No results found for this basename: CK, CKMB, TROPONINI, MYOGLOBIN,  in the last 168 hours  No components found with this basename: POCBNP,  No results found for this basename: DDIMER,  in the last 72 hours No results found for this basename: HGBA1C,  in the last 72 hours No results found for this basename: CHOL, HDL, LDLCALC, TRIG, CHOLHDL, LDLDIRECT,  in the last 72 hours  Recent Labs  09/19/13 0950  TSH 0.517   No results found for this basename: VITAMINB12, FOLATE, FERRITIN, TIBC, IRON, RETICCTPCT,  in the last 72 hours  Recent Labs  09/20/13 0330  LIPASE 8*    Urine Studies No results  found for this basename: UACOL, UAPR, USPG, UPH, UTP, UGL, UKET, UBIL, UHGB, UNIT, UROB, ULEU, UEPI, UWBC, URBC, UBAC, CAST, CRYS, UCOM, BILUA,  in the last 72 hours  MICROBIOLOGY: Recent Results (from the past 240 hour(s))  CULTURE, BLOOD (ROUTINE X 2)     Status: None   Collection Time    09/17/13 11:00 PM  Result Value Ref Range Status   Specimen Description BLOOD RIGHT HAND   Final   Special Requests BOTTLES DRAWN AEROBIC ONLY 5CC   Final   Culture  Setup Time     Final   Value: 09/18/2013 08:46     Performed at [facility]   Culture     Final   Value:        BLOOD CULTURE RECEIVED NO GROWTH TO DATE CULTURE WILL BE HELD FOR 5 DAYS BEFORE ISSUING A FINAL NEGATIVE REPORT     Performed at [facility]   Report Status PENDING   Incomplete  CULTURE, BLOOD (ROUTINE X 2)     Status: None   Collection Time    09/17/13 11:05 PM      Result Value Ref Range Status   Specimen Description BLOOD RIGHT HAND   Final   Special Requests BOTTLES DRAWN AEROBIC ONLY 10CC   Final   Culture  Setup Time     Final   Value: 09/18/2013 08:45     Performed at [facility]   Culture     Final   Value:        BLOOD CULTURE RECEIVED NO GROWTH TO DATE CULTURE WILL BE HELD FOR 5 DAYS BEFORE ISSUING A FINAL NEGATIVE REPORT     Performed at [facility]   Report Status PENDING   Incomplete    RADIOLOGY STUDIES/RESULTS: Dg Chest 2 View  08/26/2013   CLINICAL DATA:  Preoperative chest x-ray prior to left humeral repair  EXAM: CHEST  2 VIEW  COMPARISON:  Prior chest x-ray 07/19/2013  FINDINGS: Cardiac and mediastinal contours remain within normal limits. Patient is status post median sternotomy with evidence of multivessel CABG including LIMA bypass. Metallic stents project over the right and left anterior descending coronary arteries. The lungs are clear. No pleural effusion or pneumothorax. Incompletely imaged left mid humeral fracture. No acute osseous abnormality.   IMPRESSION: No active cardiopulmonary disease.  Evidence of prior percutaneous coronary artery stenting and multivessel CABG suggests a significant past history of coronary artery disease.   Electronically Signed   By: Jacqulynn Cadet M.D.   On: 08/26/2013 14:14   Dg Cholangiogram Operative  09/17/2013   CLINICAL DATA:  Cholecystectomy for cholecystitis.  EXAM: INTRAOPERATIVE CHOLANGIOGRAM  TECHNIQUE: Cholangiographic images from the C-arm fluoroscopic device were submitted for interpretation post-operatively. Please see the procedural report for the amount of contrast and the fluoroscopy time utilized.  COMPARISON:  None.  FINDINGS: Filling defects introduced into the common bile duct are likely related air bubbles. There is no evidence of biliary obstruction with contrast entering the duodenum. Visualized intrahepatic ducts are unremarkable.  IMPRESSION: Filling defects visualized in the CBD likely relate to air bubbles.   Electronically Signed   By: Aletta Edouard M.D.   On: 09/17/2013 08:09   US Abdomen Complete  09/15/2013   CLINICAL DATA:  Abdominal pain  EXAM: ULTRASOUND ABDOMEN COMPLETE  COMPARISON:  CT abdomen and pelvis September 15, 2013  FINDINGS: Gallbladder:  Sludge is seen within the gallbladder. There are no echogenic foci which move and shadowing consistent with gallstones. The gallbladder wall is thickened at 5 mm in thickness. There is no gallbladder wall edema or pericholecystic fluid. No sonographic Murphy sign noted.  Common bile duct:  Diameter: 6 mm. There is no intrahepatic, common hepatic, common bile duct dilatation.  Liver:  No focal lesion identified. Liver echogenicity is diffusely increased.  IVC:  No abnormality visualized.  Pancreas:  Visualized portion unremarkable. Portions of the pancreas were obscured by gas.  Spleen:  Size and appearance within normal limits.  Right Kidney:  Length: 10.2 cm. Echogenicity within normal limits. No mass or hydronephrosis visualized.  Left  Kidney:  Length: 10.8 cm. Echogenicity within normal limits. No mass or hydronephrosis visualized. There is scarring in the lower pole region  Abdominal aorta:  No aneurysm visualized.  Portions of the aorta are obscured by gas.  Other findings:  No demonstrable ascites.  IMPRESSION: Gallbladder wall is thickened. There is no gallbladder wall edema or pericholecystic fluid, however. There is sludge in the gallbladder but no appreciable gallstones. The wall thickening could indicate a degree of acalculus cholecystitis.  Portions of the pancreas and abdominal aorta are obscured by gas. Visualized portions of these structures appear normal.  Increased echogenicity liver consistent with fatty change. While no focal liver lesions are identified, it must be cautioned that the sensitivity ofultrasound for focal liver lesions is diminished in this circumstance.   Electronically Signed   By: Lowella Grip M.D.   On: 09/15/2013 17:13   Ct Abdomen Pelvis W Contrast  09/15/2013   CLINICAL DATA:  Intermittent mid and upper abdominal pain beginning 1 week ago.  EXAM: CT ABDOMEN AND PELVIS WITH CONTRAST  TECHNIQUE: Multidetector CT imaging of the abdomen and pelvis was performed using the standard protocol following bolus administration of intravenous contrast.  CONTRAST:  100 mL OMNIPAQUE IOHEXOL 300 MG/ML  SOLN  COMPARISON:  Aortic ultrasound 06/05/2013.  FINDINGS: Mild dependent atelectasis is seen in the lung bases. No pleural or pericardial effusion.  The liver is diffusely low attenuating consistent with fatty infiltration. No focal liver lesion is seen. The gallbladder is mildly dilated and there is some stranding about the gallbladder. The pancreas, adrenal glands, spleen and kidneys appear normal.  Fem-fem bypass graft is identified and opacifies with contrast material. There is aortoiliac atherosclerosis without aneurysm. 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 Left common iliac artery also  may be occluded with reconstitution of the internal and external iliacs.  The stomach, small bowel and appendix appear normal. No lymphadenopathy is identified.  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 No femoral head fragmentation or collapse is identified. Lumbar spondylosis is noted.  IMPRESSION: Dilated gallbladder with pericholecystic stranding worrisome for cholecystitis. Right upper quadrant ultrasound would be useful further evaluation.  Diffuse fatty infiltration of the liver.  Extensive vascular disease as detailed above. Fem-fem bypass graft appears patent.  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   Electronically Signed   By: Inge Rise M.D.   On: 09/15/2013 15:16   Dg Humerus Left  08/27/2013   CLINICAL DATA:  Postop  EXAM: LEFT HUMERUS - 2+ VIEW  COMPARISON:  Intraoperative C-arm spot films  FINDINGS: Plate and screw fixation of the comminuted fracture of the mid proximal left humerus is noted. The fixation plate is in good position. No complicating features are seen.  IMPRESSION: Fixation of mid proximal comminuted left humeral fracture.   Electronically Signed   By: Ivar Drape M.D.   On: 08/27/2013 15:27   Dg Humerus Left  08/27/2013   CLINICAL DATA:  Humerus fracture.  EXAM: LEFT HUMERUS - 2+ VIEW  COMPARISON:  None.  FINDINGS: Patient status post open reduction internal fixation of comminuted fracture of the left humerus. Evidence of prior plate and screw fixation noted. This fracture  may be old with nonunion. Repeat fracture at site of prior fracture.  IMPRESSION: Open reduction internal fixation left humeral fracture described above.   Electronically Signed   By: Marcello Moores  Register   On: 08/27/2013 12:44    Eulogio Bear,  [facility] Pager:[phone]  If 7PM-7AM, please contact night-coverage [email or web address] Password [email or web address] 09/20/2013, 12:00 PM   LOS: 5 days   **Disclaimer:  This note may have been dictated with voice recognition software. Similar sounding words can inadvertently be transcribed and this note may contain transcription errors which may not have been corrected upon publication of note.**

## 2013-09-20 NOTE — Progress Notes (Signed)
BP 96/41, Heart Rate 62 Normal Sinus.  Will hold Metoprolol 100 mg.  MD on call, Dr. Rogue Bussing, aware.  Pt also has complaints of pain 7/10 in the upper right abdomen and has reported pain to RN 2 times but within 5 minutes of reporting pain, pt is asleep.  At this time, while pt is sleeping and BP 96/41, no pain medication has been given. Will continue to monitor and administer medication as appropriate.

## 2013-09-20 NOTE — Progress Notes (Signed)
I have seen and examined the patient and agree with the assessment and plans.  Jakaden Ouzts A. Saintclair Schroader  MD, FACS  

## 2013-09-20 NOTE — Progress Notes (Signed)
Physical Therapy Treatment Patient Details Name: Wendy [name] MRN: [id number] DOB: 05-25-1950 Today's Date: 09/20/2013    History of Present Illness Admitted with abdominal pain, N/V found to be due to gallbladder, s/p Open chole, now scheduled for ERCP for removal of lodged stone.    PT Comments    Pt admitted with above. Pt currently with functional limitations due to balance and endurance deficits.  Pt will benefit from skilled PT to increase their independence and safety with mobility to allow discharge to the venue listed below.    Follow Up Recommendations  No PT follow up;Supervision for mobility/OOB     Equipment Recommendations  Other (comment) (TBA)    Recommendations for Other Services       Precautions / Restrictions Precautions Precautions: Shoulder Type of Shoulder Precautions: No active abduction, may WB for mobility as needed, pendulums, ADL, sling education, elevation of L UE, gently PROM Shoulder Interventions: Shoulder sling/immobilizer Restrictions Weight Bearing Restrictions: No LUE Weight Bearing: Weight bearing as tolerated    Mobility  Bed Mobility Overal bed mobility: Needs Assistance Bed Mobility: Supine to Sit     Supine to sit: Supervision        Transfers Overall transfer level: Needs assistance Equipment used: Rolling walker (2 wheeled) Transfers: Sit to/from Stand Sit to Stand: Min guard         General transfer comment: reinforced hand placement for safety  Ambulation/Gait Ambulation/Gait assistance: Min guard Ambulation Distance (Feet): 150 Feet Assistive device: Rolling walker (2 wheeled) Gait Pattern/deviations: Step-through pattern;Decreased stride length   Gait velocity interpretation: Below normal speed for age/gender General Gait Details: steady, but guarded.  Pt anxious.   Stairs            Wheelchair Mobility    Modified Rankin (Stroke Patients Only)       Balance Overall balance assessment:  Needs assistance;History of Falls Sitting-balance support: No upper extremity supported;Feet supported Sitting balance-Leahy Scale: Fair     Standing balance support: Bilateral upper extremity supported;During functional activity Standing balance-Leahy Scale: Fair Standing balance comment: can stand statically without UE support.                    Cognition Arousal/Alertness: Awake/alert Behavior During Therapy: WFL for tasks assessed/performed Overall Cognitive Status: Within Functional Limits for tasks assessed                      Exercises      General Comments        Pertinent Vitals/Pain VSS, no pain    Home Living                      Prior Function            PT Goals (current goals can now be found in the care plan section) Progress towards PT goals: Progressing toward goals    Frequency  Min 3X/week    PT Plan Current plan remains appropriate    Co-evaluation             End of Session Equipment Utilized During Treatment: Gait belt;Oxygen Activity Tolerance: Patient tolerated treatment well Patient left: in bed;with call bell/phone within reach;with family/visitor present     Time: 1046-1100 PT Time Calculation (min): 14 min  Charges:  $Gait Training: 8-22 mins                    G Codes:  INGOLD,Maclin Guerrette 09/20/2013, 1:17 PM [facility] [phone] (pager)

## 2013-09-20 NOTE — Progress Notes (Signed)
Daily Rounding Note  09/20/2013, 9:36 AM  LOS: 5 days   SUBJECTIVE:       Brown watery stool once yest, once this AM.  Still c/o pain of RUQ and lateral lower rib area.  Tolerating clears.  No nausea.  Not getting out of bed much.   OBJECTIVE:         Vital signs in last 24 hours:    Temp:  [98.2 F (36.8 C)-99.6 F (37.6 C)] 98.4 F (36.9 C) (07/17 0426) Pulse Rate:  [66-112] 66 (07/17 0426) Resp:  [11-22] 16 (07/17 0426) BP: (92-134)/(37-64) 103/37 mmHg (07/17 0637) SpO2:  [93 %-100 %] 97 % (07/17 0914) Weight:  [90.1 kg (198 lb 10.2 oz)] 90.1 kg (198 lb 10.2 oz) (07/17 0426) Last BM Date: 09/20/13 General: looks depressed, comfortable   Heart: RRR Chest: clear bil.  Unlabored breathing Abdomen: soft, ND, BS active.  Tender in RUQ  Extremities: no CCE Neuro/Psych:  Depressed, relaxed.   Intake/Output from previous day: 07/16 0701 - 07/17 0700 In: 1161.7 [P.O.:360; I.V.:801.7] Out: 345 [Urine:325; Drains:20]  Intake/Output this shift:    Lab Results:  Recent Labs  09/18/13 0028 09/19/13 0612 09/20/13 0330  WBC 8.7 5.5 4.2  HGB 9.3* 8.7* 8.0*  HCT 29.3* 27.8* 25.2*  PLT 241 189 205   BMET  Recent Labs  09/18/13 0020 09/19/13 0615 09/20/13 0330  NA 134* 140 139  K 3.8 3.8 4.0  CL 95* 97 97  CO2 22 28 28   GLUCOSE 114* 91 91  BUN 19 10 8   CREATININE 1.03 0.73 0.75  CALCIUM 8.3* 8.6 7.8*   LFT  Recent Labs  09/18/13 0020 09/19/13 0615 09/20/13 0330  PROT 6.2 6.1 5.5*  ALBUMIN 2.3* 2.2* 2.0*  AST 81* 36 57*  ALT 105* 67* 60*  ALKPHOS 99 100 101  BILITOT 0.6 0.7 0.6    Studies/Results: Dg Ercp Biliary & Pancreatic Ducts  09/19/2013   CLINICAL DATA:  Possible common bile duct stone  EXAM: ERCP  TECHNIQUE: Multiple spot images obtained with the fluoroscopic device and submitted for interpretation post-procedure.  COMPARISON:  09/16/2013  FINDINGS: Multiple spot films were obtained  during in the ERCP. Transient filling defects are noted consistent with injected air bubbles. No definitive stone is identified. Correlation with the findings during the procedure is recommended.  IMPRESSION: These images were submitted for radiologic interpretation only. Please see the procedural report for the amount of contrast and the fluoroscopy time utilized.   Electronically Signed   By: Inez Catalina M.D.   On: 09/19/2013 15:30   Scheduled Meds: . aspirin EC  81 mg Oral Daily  . diltiazem  60 mg Oral 4 times per day  . enoxaparin (LOVENOX) injection  40 mg Subcutaneous Q24H  . ezetimibe  10 mg Oral Daily  . fluticasone  1 puff Inhalation BID  . loratadine  10 mg Oral Daily  . metoprolol  100 mg Oral BID  . mometasone-formoterol  2 puff Inhalation BID  . pantoprazole  80 mg Oral BID  . simvastatin  20 mg Oral q1800  . tiotropium  18 mcg Inhalation Daily   Continuous Infusions: . sodium chloride 75 mL/hr at 09/20/13 0636   PRN Meds:.acetaminophen, acetaminophen, albuterol, ALPRAZolam, LORazepam, methocarbamol, ondansetron (ZOFRAN) IV, ondansetron, oxyCODONE-acetaminophen  ASSESMENT:   * Abnormal transaminases post lap converted to open chole 7/13.  7/16 ERCP/shincterotomy with no stones, normal caliber duct, no strictures, negative balloon sweep,  temporary pancreatic duct placed and later removed during procedure.  Overall transaminases improved. 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  * Chronic plavix for hx cardiac DES.  On hold.  *  A fib/flutter with RVR. Now  Back in sinus rhythm. Cardiology considering starting anticoagulation.  Lovenox on board  * Anemia, normocytic. Post op phenomena. Hgb ~ 12 on admssion and in June 2015. No gross bleeding.  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  Was on for hx DES, late 2013. May also be starting on  anticoagulation.  *  Diet has been advanced to [facility].  *  Will sign off.      Azucena Freed  09/20/2013, 9:36 AM Pager: [phone]  GI Attending Note  I have personally taken an interval history, reviewed the chart, and examined the patient.  I agree with the extender's note, impression and recommendations.  Sandy Salaam. Deatra Ina, MD, [facility] [phone]

## 2013-09-20 NOTE — Progress Notes (Signed)
  Echocardiogram 2D Echocardiogram has been performed.  Wendy [name] 09/20/2013, 2:57 PM

## 2013-09-20 NOTE — Anesthesia Postprocedure Evaluation (Signed)
  Anesthesia Post-op Note  Patient: Wendy [name]  Procedure(s) Performed: Procedure(s): ENDOSCOPIC RETROGRADE CHOLANGIOPANCREATOGRAPHY (ERCP) (N/A)  Patient Location: PACU  Anesthesia Type:General  Level of Consciousness: awake  Airway and Oxygen Therapy: Patient Spontanous Breathing and Patient connected to nasal cannula oxygen  Post-op Pain: none  Post-op Assessment: Post-op Vital signs reviewed, Patient's Cardiovascular Status Stable, Respiratory Function Stable, Patent Airway and No signs of Nausea or vomiting  Post-op Vital Signs: Reviewed and stable  Last Vitals:  Filed Vitals:   09/20/13 1056  BP: 104/43  Pulse: 70  Temp:   Resp:     Complications: No apparent anesthesia complications

## 2013-09-20 NOTE — Progress Notes (Signed)
GI Attending Note  I have personally taken an interval history, reviewed the chart, and examined the patient.  I agree with the extender's note, impression and recommendations.  Robert D. Kaplan, MD, FACG [facility] [phone]  

## 2013-09-21 DIAGNOSIS — I2510 Atherosclerotic heart disease of native coronary artery without angina pectoris: Secondary | ICD-10-CM

## 2013-09-21 DIAGNOSIS — I2584 Coronary atherosclerosis due to calcified coronary lesion: Secondary | ICD-10-CM

## 2013-09-21 LAB — CBC
HCT: 28.4 % — ABNORMAL LOW (ref 36.0–46.0)
Hemoglobin: 8.7 g/dL — ABNORMAL LOW (ref 12.0–15.0)
MCH: 29.5 pg (ref 26.0–34.0)
MCHC: 30.6 g/dL (ref 30.0–36.0)
MCV: 96.3 fL (ref 78.0–100.0)
Platelets: 199 10*3/uL (ref 150–400)
RBC: 2.95 MIL/uL — ABNORMAL LOW (ref 3.87–5.11)
RDW: 13.8 % (ref 11.5–15.5)
WBC: 3.9 10*3/uL — ABNORMAL LOW (ref 4.0–10.5)

## 2013-09-21 MED ORDER — ONDANSETRON 4 MG PO TBDP: 4.0000 mg | ORAL_TABLET | Freq: Three times a day (TID) | ORAL | Status: DC | PRN

## 2013-09-21 MED ORDER — OXYCODONE-ACETAMINOPHEN 5-325 MG PO TABS: 1.0000 | ORAL_TABLET | Freq: Four times a day (QID) | ORAL | Status: DC | PRN

## 2013-09-21 MED ORDER — METOPROLOL TARTRATE 100 MG PO TABS: 100.0000 mg | ORAL_TABLET | Freq: Two times a day (BID) | ORAL | Status: DC

## 2013-09-21 MED ORDER — CIPROFLOXACIN HCL 500 MG PO TABS: 500.0000 mg | ORAL_TABLET | Freq: Two times a day (BID) | ORAL | Status: DC

## 2013-09-21 MED ORDER — DILTIAZEM HCL 60 MG PO TABS: 60.0000 mg | ORAL_TABLET | Freq: Four times a day (QID) | ORAL | Status: DC

## 2013-09-21 MED ORDER — METOPROLOL TARTRATE 50 MG PO TABS
50.0000 mg | ORAL_TABLET | Freq: Two times a day (BID) | ORAL | Status: DC
  Filled 2013-09-21: qty 1

## 2013-09-21 MED ORDER — ATORVASTATIN CALCIUM 10 MG PO TABS: 10.0000 mg | ORAL_TABLET | Freq: Every day | ORAL | Status: DC

## 2013-09-21 MED ORDER — METOPROLOL TARTRATE 50 MG PO TABS: 50.0000 mg | ORAL_TABLET | Freq: Two times a day (BID) | ORAL | Status: DC

## 2013-09-21 MED ORDER — ALPRAZOLAM 0.5 MG PO TABS: 0.5000 mg | ORAL_TABLET | Freq: Three times a day (TID) | ORAL | Status: DC | PRN

## 2013-09-21 NOTE — Discharge Summary (Addendum)
Physician Discharge Summary  Patient ID: Wendy [name] MRN: [id number] DOB/AGE: Apr 09, 1950 63 y.o.  Admit date: 09/15/2013 Discharge date: 09/21/2013  Primary Care Physician:  Daphene Jaeger, PA-C  Discharge Diagnoses:   .  Acute cholecystitis status post cholecystectomy .  Acute respiratory failure  . Suspected choledocholithiasis  . Atrial fibrillation with RVR  . Anxiety . CAD (coronary artery disease) . Chronic back pain . COPD (chronic obstructive pulmonary disease) . GERD (gastroesophageal reflux disease) . HLD (hyperlipidemia) . Hypertension   Consults: General surgery, Dr [name]                    Gastroenterology, Dr. Deatra Ina                    Cardiology, Dr. Radford Pax    Recommendations for Outpatient Follow-up:    Allergies:   Allergies  Allergen Reactions  . Benadryl [Diphenhydramine] Shortness Of Breath  . Adhesive [Tape]     Burning of skin  . Amoxicillin Nausea And Vomiting  . Flexeril [Cyclobenzaprine] Other (See Comments)    States that it messes with her blood circulation; pt states that she has to walk around     Discharge Medications:   Medication List    STOP taking these medications       clopidogrel 75 MG tablet  Commonly known as:  PLAVIX     FISH OIL + D3 PO     oxyCODONE 5 MG immediate release tablet  Commonly known as:  Oxy IR/ROXICODONE     simvastatin 20 MG tablet  Commonly known as:  ZOCOR     triamterene-hydrochlorothiazide 37.5-25 MG per capsule  Commonly known as:  DYAZIDE      TAKE these medications       albuterol 108 (90 BASE) MCG/ACT inhaler  Commonly known as:  PROVENTIL HFA;VENTOLIN HFA  Inhale 2 puffs into the lungs every 6 (six) hours as needed for wheezing or shortness of breath.     ALPRAZolam 0.5 MG tablet  Commonly known as:  XANAX  Take 1 tablet (0.5 mg total) by mouth 3 (three) times daily as needed for anxiety.     aspirin EC 81 MG tablet  Take 81 mg by mouth daily.     atorvastatin 10 MG  tablet  Commonly known as:  LIPITOR  Take 1 tablet (10 mg total) by mouth at bedtime.     beclomethasone 80 MCG/ACT inhaler  Commonly known as:  QVAR  Inhale 2 puffs into the lungs 2 (two) times daily.     cetirizine 10 MG tablet  Commonly known as:  ZYRTEC  Take 10 mg by mouth daily.     ciprofloxacin 500 MG tablet  Commonly known as:  CIPRO  Take 1 tablet (500 mg total) by mouth 2 (two) times daily. X 1 week     Cyanocobalamin 1000 MCG/ML Kit  Inject 1,000 mcg as directed every 30 (thirty) days. 15th of each month     DSS 100 MG Caps  Take 100 mg by mouth 2 (two) times daily.     ezetimibe 10 MG tablet  Commonly known as:  ZETIA  Take 10 mg by mouth daily.     Fluticasone-Salmeterol 250-50 MCG/DOSE Aepb  Commonly known as:  ADVAIR  Inhale 1 puff into the lungs 2 (two) times daily.     methocarbamol 500 MG tablet  Commonly known as:  ROBAXIN  Take 1-2 tablets (500-1,000 mg total) by mouth every 6 (six) hours  as needed for muscle spasms.     metoprolol 100 MG tablet  Commonly known as:  LOPRESSOR  Take 1 tablet (100 mg total) by mouth 2 (two) times daily.  Start taking on:  09/22/2013     mometasone-formoterol 100-5 MCG/ACT Aero  Commonly known as:  DULERA  Inhale 2 puffs into the lungs 2 (two) times daily as needed for wheezing.     nitroGLYCERIN 0.4 MG SL tablet  Commonly known as:  NITROSTAT  Place 0.4 mg under the tongue every 5 (five) minutes as needed for chest pain.     omeprazole 40 MG capsule  Commonly known as:  PRILOSEC  Take 40 mg by mouth 2 (two) times daily.     ondansetron 4 MG disintegrating tablet  Commonly known as:  ZOFRAN ODT  Take 1 tablet (4 mg total) by mouth every 8 (eight) hours as needed for nausea or vomiting.     oxyCODONE-acetaminophen 5-325 MG per tablet  Commonly known as:  PERCOCET/ROXICET  Take 1-2 tablets by mouth every 6 (six) hours as needed for severe pain.     TUDORZA PRESSAIR 400 MCG/ACT Aepb  Generic drug:  Aclidinium  Bromide  Inhale 1 puff into the lungs daily.         Brief H and P: For complete details please refer to admission H and P, but in brief, Wendy [name] is a 63 y.o. Caucasian female with history of impaired glucose intolerance, hypertension, peripheral artery disease, COPD, coronary artery disease, hyperlipidemia, GERD, anxiety, and chronic back pain who presents with the above complaints. Patient reports that her symptoms started on 09/09/2013 when she noted abdominal pain, since then her symptoms have improved. However on 09/13/2013 she had recurrence of her abdominal pain which was in the epigastric region and the pain was worse than it has been in the past. This pain was constant in nature. Only pain medications would improve the pain. She had not been able to eat or tolerate any oral intake. Due to ongoing symptoms she presented to the emergency department for further evaluation. CT of abdomen and pelvis was suggestive of cholecystitis an abdominal ultrasound showed gallbladder wall was thickened, sludge was noted but no gallstones. Patient's liver function tests were normal. Patient was evaluated by general surgery, who are planning on laparoscopic cholecystectomy in the next 24-48 hours. However given patient's multiple medical comorbidities hospitalist service was asked to admit the patient for further care and management. Patient denied any recent fevers, chest pain, diarrhea, headaches or vision changes. She admitted to having nausea and vomiting at home, denied any blood in her emesis. Admited to having shortness of breath with deep inspiration due to abdominal pain.   Hospital Course:   Acute cholecystitis with gallbladder abscess Patient is a 63 year old female who presented with episodic right upper quadrant abdominal pain over the last 2 weeks, persistent with associated nausea and vomiting. CT scan in the ER showed of cholecystitis an abdominal ultrasound showed gallbladder wall was  thickened, sludge was noted but no gallstones. Patient's liver function tests were normal. General surgery was consulted, patient was made n.p.o. and placed on IV ciprofloxacin. Patient underwent laparoscopic cholecystectomy on 7/13 but was converted to open cholecystectomy secondary to difficulty with the cystic duct. Per op note, Patient was found to have hydrops of the gallbladder with gallbladder abscess as well. Possible stones and questionable stricturing of the CBD on C-gram. GI was consulted. Plan was to undergo ERCP unfortunately developed atrial fibrillation with RVR.  Patient underwent ERCP on 7/16 which was essentially normal, no strictures or stones. -Patient will be discharged with the drain in and patient will followup in office mid next week for staple and drain removal.     Atrial fib with RVR- briefly, converted back to sinus. Patient was placed on diltiazem and metoprolol. Her heart rate has been in 50's at discharge and BP borderline, hence diltiazem was discontinued and patient will stay on BB as per recommendations from cardiology. Patient was followed by cardiology closely during hospitalization. At this point she is on aspirin, will likely need anticoagulation, defer to cardiology on outpatient appointment. 2-D echo showed EF of 60-65%, no regional wall motion abnormalities  Acute resp failure  -incentive spirometry  -wean O2 as tolerated  - not wheezing   Suspected Choledocholithiasis  -seen on intra-operative cholangiogram-seen by GI-ERCP on 7/16   Hypertension  -Stable but on borderline low side.   Hyperlipidemia  -Continue statin.   COPD  -Stable at this time. Continue home inhalers.   Obstructive sleep apnea  -Continue CPAP at bedtime.   GERD  -Continue PPI.   Anxiety  -Benzodiazepines as needed   Peripheral artery disease  -Stable.    Day of Discharge BP 97/47  Pulse 58  Temp(Src) 97.6 F (36.4 C) (Oral)  Resp 20  Ht 5' 2"  (1.575 m)  Wt 88.2 kg  (194 lb 7.1 oz)  BMI 35.56 kg/m2  SpO2 93%  Physical Exam: General: Alert and awake oriented x3 not in any acute distress. HEENT: anicteric sclera, pupils reactive to light and accommodation CVS: S1-S2 clear no murmur rubs or gallops Chest: clear to auscultation bilaterally, no wheezing rales or rhonchi Abdomen: soft  mild tenderness in the right upper quadrant, drain +  Extremities: no cyanosis, clubbing or edema noted bilaterally Neuro: Cranial nerves II-XII intact, no focal neurological deficits   The results of significant diagnostics from this hospitalization (including imaging, microbiology, ancillary and laboratory) are listed below for reference.    LAB RESULTS: Basic Metabolic Panel:  Recent Labs Lab 09/19/13 0615 09/20/13 0330  NA 140 139  K 3.8 4.0  CL 97 97  CO2 28 28  GLUCOSE 91 91  BUN 10 8  CREATININE 0.73 0.75  CALCIUM 8.6 7.8*   Liver Function Tests:  Recent Labs Lab 09/19/13 0615 09/20/13 0330  AST 36 57*  ALT 67* 60*  ALKPHOS 100 101  BILITOT 0.7 0.6  PROT 6.1 5.5*  ALBUMIN 2.2* 2.0*    Recent Labs Lab 09/15/13 1302 09/20/13 0330  LIPASE 9* 8*   No results found for this basename: AMMONIA,  in the last 168 hours CBC:  Recent Labs Lab 09/15/13 1302  09/20/13 0330 09/21/13 0359  WBC 10.5  < > 4.2 3.9*  NEUTROABS 8.6*  --   --   --   HGB 12.5  < > 8.0* 8.7*  HCT 38.3  < > 25.2* 28.4*  MCV 91.8  < > 93.3 96.3  PLT 255  < > 205 199  < > = values in this interval not displayed. Cardiac Enzymes: No results found for this basename: CKTOTAL, CKMB, CKMBINDEX, TROPONINI,  in the last 168 hours BNP: No components found with this basename: POCBNP,  CBG: No results found for this basename: GLUCAP,  in the last 168 hours  Significant Diagnostic Studies:  Dg Cholangiogram Operative  09/19/2013   ADDENDUM REPORT: 09/19/2013 09:11  ADDENDUM: The original report was by Dr. Aletta Edouard. The following addendum is by Dr.  Van Clines:   Dr. Erskine Emery came by the reading room at 9:05 AM on 09/19/2013 and we reviewed this intraoperative cholangiogram.  The filling defects in the common bile duct do bounce around in a manner which can sometimes be seen with gas bubbles, but the subtraction injection has an appearance raising concern that at least one of the filling defects may in fact represent a stone rather than a gas bubble. Also, the distal common hepatic duct and proximal common bile duct never fill well, and this area seems narrowed compared to the rest of the extrahepatic biliary tree. There could be some stricturing or stenosis in this vicinity.  Finally, on the subtraction injection, there is some extravasation of contrast, probably incidental due to a lack of watertight seal at the cystic duct remnant cannulation site rather than discontinuity in another part of the extrahepatic bile duct.   Electronically Signed   By: Sherryl Barters M.D.   On: 09/19/2013 09:11   09/19/2013   CLINICAL DATA:  Cholecystectomy for cholecystitis.  EXAM: INTRAOPERATIVE CHOLANGIOGRAM  TECHNIQUE: Cholangiographic images from the C-arm fluoroscopic device were submitted for interpretation post-operatively. Please see the procedural report for the amount of contrast and the fluoroscopy time utilized.  COMPARISON:  None.  FINDINGS: Filling defects introduced into the common bile duct are likely related air bubbles. There is no evidence of biliary obstruction with contrast entering the duodenum. Visualized intrahepatic ducts are unremarkable.  IMPRESSION: Filling defects visualized in the CBD likely relate to air bubbles.  Electronically Signed: By: Aletta Edouard M.D. On: 09/17/2013 08:09   US Abdomen Complete  09/15/2013   CLINICAL DATA:  Abdominal pain  EXAM: ULTRASOUND ABDOMEN COMPLETE  COMPARISON:  CT abdomen and pelvis September 15, 2013  FINDINGS: Gallbladder:  Sludge is seen within the gallbladder. There are no echogenic foci which move and shadowing  consistent with gallstones. The gallbladder wall is thickened at 5 mm in thickness. There is no gallbladder wall edema or pericholecystic fluid. No sonographic Murphy sign noted.  Common bile duct:  Diameter: 6 mm. There is no intrahepatic, common hepatic, common bile duct dilatation.  Liver:  No focal lesion identified. Liver echogenicity is diffusely increased.  IVC:  No abnormality visualized.  Pancreas:  Visualized portion unremarkable. Portions of the pancreas were obscured by gas.  Spleen:  Size and appearance within normal limits.  Right Kidney:  Length: 10.2 cm. Echogenicity within normal limits. No mass or hydronephrosis visualized.  Left Kidney:  Length: 10.8 cm. Echogenicity within normal limits. No mass or hydronephrosis visualized. There is scarring in the lower pole region  Abdominal aorta:  No aneurysm visualized.  Portions of the aorta are obscured by gas.  Other findings:  No demonstrable ascites.  IMPRESSION: Gallbladder wall is thickened. There is no gallbladder wall edema or pericholecystic fluid, however. There is sludge in the gallbladder but no appreciable gallstones. The wall thickening could indicate a degree of acalculus cholecystitis.  Portions of the pancreas and abdominal aorta are obscured by gas. Visualized portions of these structures appear normal.  Increased echogenicity liver consistent with fatty change. While no focal liver lesions are identified, it must be cautioned that the sensitivity ofultrasound for focal liver lesions is diminished in this circumstance.   Electronically Signed   By: Lowella Grip M.D.   On: 09/15/2013 17:13   Ct Abdomen Pelvis W Contrast  09/15/2013   CLINICAL DATA:  Intermittent mid and upper abdominal pain beginning 1 week ago.  EXAM: CT ABDOMEN  AND PELVIS WITH CONTRAST  TECHNIQUE: Multidetector CT imaging of the abdomen and pelvis was performed using the standard protocol following bolus administration of intravenous contrast.  CONTRAST:  100 mL  OMNIPAQUE IOHEXOL 300 MG/ML  SOLN  COMPARISON:  Aortic ultrasound 06/05/2013.  FINDINGS: Mild dependent atelectasis is seen in the lung bases. No pleural or pericardial effusion.  The liver is diffusely low attenuating consistent with fatty infiltration. No focal liver lesion is seen. The gallbladder is mildly dilated and there is some stranding about the gallbladder. The pancreas, adrenal glands, spleen and kidneys appear normal.  Fem-fem bypass graft is identified and opacifies with contrast material. There is aortoiliac atherosclerosis without aneurysm. 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 Left common iliac artery also may be occluded with reconstitution of the internal and external iliacs.  The stomach, small bowel and appendix appear normal. No lymphadenopathy is identified.  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 No femoral head fragmentation or collapse is identified. Lumbar spondylosis is noted.  IMPRESSION: Dilated gallbladder with pericholecystic stranding worrisome for cholecystitis. Right upper quadrant ultrasound would be useful further evaluation.  Diffuse fatty infiltration of the liver.  Extensive vascular disease as detailed above. Fem-fem bypass graft appears patent.  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   Electronically Signed   By: Inge Rise M.D.   On: 09/15/2013 15:16    2D ECHO: Study Conclusions  - Left ventricle: The cavity size was normal. Wall thickness was normal. Systolic function was normal. The estimated ejection fraction was in the range of 60% to 65%. Wall motion was normal; there were no regional wall motion abnormalities. Left ventricular diastolic function parameters were normal. - Mitral valve: There was mild regurgitation.    Disposition and Follow-up: Discharge Instructions   Diet - low sodium heart healthy    Complete by:  As  directed      Discharge instructions    Complete by:  As directed   You should empty the drain and record output twice a day.  Follow up in the surgery office mid next week for staple and drain removal.     Increase activity slowly    Complete by:  As directed             DISPOSITION: Home  DIET: Heart healthy diet   TDISCHARGE FOLLOW-UP Follow-up Information   Follow up with [facility] In 1 week. (follow up as discussed or return here for worsening pain or if you develop fever or persistent vomiting, for hospital follow-up)    Contact information:   [place] [id number] [phone]      Follow up with HOUT, BRITTANY, PA-C. Schedule an appointment as soon as possible for a visit in 2 weeks. (for hospital follow-up)    Specialty:  Physician Assistant   Contact information:   [place]. [address] [place] [address] [phone]       Follow up with Darlin Coco, MD. Schedule an appointment as soon as possible for a visit in 2 weeks. (for hospital follow-up)    Specialty:  Cardiology   Contact information:   [address] [place] [address] [phone]       Time spent on Discharge: 45 mins  Signed:   RAI,RIPUDEEP M.D. [facility] 09/21/2013, 4:37 PM Pager: [phone]   **Disclaimer: This note was dictated  with voice recognition software. Similar sounding words can inadvertently be transcribed and this note may contain transcription errors which may not have been corrected upon publication of note.**

## 2013-09-21 NOTE — Progress Notes (Signed)
SUBJECTIVE:  No complaints  OBJECTIVE:   Vitals:   Filed Vitals:   09/21/13 0028 09/21/13 0047 09/21/13 0500 09/21/13 0826  BP: 87/31 98/42 105/38   Pulse: 59  57   Temp: 98 F (36.7 C)  97.5 F (36.4 C)   TempSrc: Oral  Oral   Resp: 18  18   Height:      Weight:   194 lb 7.1 oz (88.2 kg)   SpO2: 95%  98% 93%   I&O's:   Intake/Output Summary (Last 24 hours) at 09/21/13 0856 Last data filed at 09/21/13 2694  Gross per 24 hour  Intake    300 ml  Output   1230 ml  Net   -930 ml   TELEMETRY: Reviewed telemetry pt in NSR:     PHYSICAL EXAM General: Well developed, well nourished, in no acute distress Head: Eyes PERRLA, No xanthomas.   Normal cephalic and atramatic  Lungs:   Clear bilaterally to auscultation and percussion. Heart:   HRRR S1 S2 Pulses are 2+ & equal. Abdomen: Bowel sounds are positive, abdomen soft and non-tender without masses  Extremities:   No clubbing, cyanosis or edema.  DP +1 Neuro: Alert and oriented X 3. Psych:  Good affect, responds appropriately   LABS: Basic Metabolic Panel:  Recent Labs  09/19/13 0615 09/20/13 0330  NA 140 139  K 3.8 4.0  CL 97 97  CO2 28 28  GLUCOSE 91 91  BUN 10 8  CREATININE 0.73 0.75  CALCIUM 8.6 7.8*   Liver Function Tests:  Recent Labs  09/19/13 0615 09/20/13 0330  AST 36 57*  ALT 67* 60*  ALKPHOS 100 101  BILITOT 0.7 0.6  PROT 6.1 5.5*  ALBUMIN 2.2* 2.0*    Recent Labs  09/20/13 0330  LIPASE 8*   CBC:  Recent Labs  09/20/13 0330 09/21/13 0359  WBC 4.2 3.9*  HGB 8.0* 8.7*  HCT 25.2* 28.4*  MCV 93.3 96.3  PLT 205 199   Cardiac Enzymes: No results found for this basename: CKTOTAL, CKMB, CKMBINDEX, TROPONINI,  in the last 72 hours BNP: No components found with this basename: POCBNP,  D-Dimer: No results found for this basename: DDIMER,  in the last 72 hours Hemoglobin A1C: No results found for this basename: HGBA1C,  in the last 72 hours Fasting Lipid Panel: No results found  for this basename: CHOL, HDL, LDLCALC, TRIG, CHOLHDL, LDLDIRECT,  in the last 72 hours Thyroid Function Tests:  Recent Labs  09/19/13 0950  TSH 0.517   Anemia Panel: No results found for this basename: VITAMINB12, FOLATE, FERRITIN, TIBC, IRON, RETICCTPCT,  in the last 72 hours Coag Panel:   Lab Results  Component Value Date   INR 0.92 07/19/2013    RADIOLOGY: Dg Chest 2 View  08/26/2013   CLINICAL DATA:  Preoperative chest x-ray prior to left humeral repair  EXAM: CHEST  2 VIEW  COMPARISON:  Prior chest x-ray 07/19/2013  FINDINGS: Cardiac and mediastinal contours remain within normal limits. Patient is status post median sternotomy with evidence of multivessel CABG including LIMA bypass. Metallic stents project over the right and left anterior descending coronary arteries. The lungs are clear. No pleural effusion or pneumothorax. Incompletely imaged left mid humeral fracture. No acute osseous abnormality.  IMPRESSION: No active cardiopulmonary disease.  Evidence of prior percutaneous coronary artery stenting and multivessel CABG suggests a significant past history of coronary artery disease.   Electronically Signed   By: Jacqulynn Cadet M.D.   On:  08/26/2013 14:14   Dg Cholangiogram Operative  09/19/2013   ADDENDUM REPORT: 09/19/2013 09:11  ADDENDUM: The original report was by Dr. Aletta Edouard. The following addendum is by Dr. Van Clines:  Dr. Erskine Emery came by the reading room at 9:05 AM on 09/19/2013 and we reviewed this intraoperative cholangiogram.  The filling defects in the common bile duct do bounce around in a manner which can sometimes be seen with gas bubbles, but the subtraction injection has an appearance raising concern that at least one of the filling defects may in fact represent a stone rather than a gas bubble. Also, the distal common hepatic duct and proximal common bile duct never fill well, and this area seems narrowed compared to the rest of the extrahepatic  biliary tree. There could be some stricturing or stenosis in this vicinity.  Finally, on the subtraction injection, there is some extravasation of contrast, probably incidental due to a lack of watertight seal at the cystic duct remnant cannulation site rather than discontinuity in another part of the extrahepatic bile duct.   Electronically Signed   By: Sherryl Barters M.D.   On: 09/19/2013 09:11   09/19/2013   CLINICAL DATA:  Cholecystectomy for cholecystitis.  EXAM: INTRAOPERATIVE CHOLANGIOGRAM  TECHNIQUE: Cholangiographic images from the C-arm fluoroscopic device were submitted for interpretation post-operatively. Please see the procedural report for the amount of contrast and the fluoroscopy time utilized.  COMPARISON:  None.  FINDINGS: Filling defects introduced into the common bile duct are likely related air bubbles. There is no evidence of biliary obstruction with contrast entering the duodenum. Visualized intrahepatic ducts are unremarkable.  IMPRESSION: Filling defects visualized in the CBD likely relate to air bubbles.  Electronically Signed: By: Aletta Edouard M.D. On: 09/17/2013 08:09   US Abdomen Complete  09/15/2013   CLINICAL DATA:  Abdominal pain  EXAM: ULTRASOUND ABDOMEN COMPLETE  COMPARISON:  CT abdomen and pelvis September 15, 2013  FINDINGS: Gallbladder:  Sludge is seen within the gallbladder. There are no echogenic foci which move and shadowing consistent with gallstones. The gallbladder wall is thickened at 5 mm in thickness. There is no gallbladder wall edema or pericholecystic fluid. No sonographic Murphy sign noted.  Common bile duct:  Diameter: 6 mm. There is no intrahepatic, common hepatic, common bile duct dilatation.  Liver:  No focal lesion identified. Liver echogenicity is diffusely increased.  IVC:  No abnormality visualized.  Pancreas:  Visualized portion unremarkable. Portions of the pancreas were obscured by gas.  Spleen:  Size and appearance within normal limits.  Right Kidney:   Length: 10.2 cm. Echogenicity within normal limits. No mass or hydronephrosis visualized.  Left Kidney:  Length: 10.8 cm. Echogenicity within normal limits. No mass or hydronephrosis visualized. There is scarring in the lower pole region  Abdominal aorta:  No aneurysm visualized.  Portions of the aorta are obscured by gas.  Other findings:  No demonstrable ascites.  IMPRESSION: Gallbladder wall is thickened. There is no gallbladder wall edema or pericholecystic fluid, however. There is sludge in the gallbladder but no appreciable gallstones. The wall thickening could indicate a degree of acalculus cholecystitis.  Portions of the pancreas and abdominal aorta are obscured by gas. Visualized portions of these structures appear normal.  Increased echogenicity liver consistent with fatty change. While no focal liver lesions are identified, it must be cautioned that the sensitivity ofultrasound for focal liver lesions is diminished in this circumstance.   Electronically Signed   By: Lowella Grip M.D.  On: 09/15/2013 17:13   Ct Abdomen Pelvis W Contrast  09/15/2013   CLINICAL DATA:  Intermittent mid and upper abdominal pain beginning 1 week ago.  EXAM: CT ABDOMEN AND PELVIS WITH CONTRAST  TECHNIQUE: Multidetector CT imaging of the abdomen and pelvis was performed using the standard protocol following bolus administration of intravenous contrast.  CONTRAST:  100 mL OMNIPAQUE IOHEXOL 300 MG/ML  SOLN  COMPARISON:  Aortic ultrasound 06/05/2013.  FINDINGS: Mild dependent atelectasis is seen in the lung bases. No pleural or pericardial effusion.  The liver is diffusely low attenuating consistent with fatty infiltration. No focal liver lesion is seen. The gallbladder is mildly dilated and there is some stranding about the gallbladder. The pancreas, adrenal glands, spleen and kidneys appear normal.  Fem-fem bypass graft is identified and opacifies with contrast material. There is aortoiliac atherosclerosis without  aneurysm. 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 Left common iliac artery also may be occluded with reconstitution of the internal and external iliacs.  The stomach, small bowel and appendix appear normal. No lymphadenopathy is identified.  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 No femoral head fragmentation or collapse is identified. Lumbar spondylosis is noted.  IMPRESSION: Dilated gallbladder with pericholecystic stranding worrisome for cholecystitis. Right upper quadrant ultrasound would be useful further evaluation.  Diffuse fatty infiltration of the liver.  Extensive vascular disease as detailed above. Fem-fem bypass graft appears patent.  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   Electronically Signed   By: Inge Rise M.D.   On: 09/15/2013 15:16   Dg Ercp Biliary & Pancreatic Ducts  09/19/2013   CLINICAL DATA:  Possible common bile duct stone  EXAM: ERCP  TECHNIQUE: Multiple spot images obtained with the fluoroscopic device and submitted for interpretation post-procedure.  COMPARISON:  09/16/2013  FINDINGS: Multiple spot films were obtained during in the ERCP. Transient filling defects are noted consistent with injected air bubbles. No definitive stone is identified. Correlation with the findings during the procedure is recommended.  IMPRESSION: These images were submitted for radiologic interpretation only. Please see the procedural report for the amount of contrast and the fluoroscopy time utilized.   Electronically Signed   By: Inez Catalina M.D.   On: 09/19/2013 15:30   Dg Humerus Left  08/27/2013   CLINICAL DATA:  Postop  EXAM: LEFT HUMERUS - 2+ VIEW  COMPARISON:  Intraoperative C-arm spot films  FINDINGS: Plate and screw fixation of the comminuted fracture of the mid proximal left humerus is noted. The fixation plate is in good position. No complicating features are seen.   IMPRESSION: Fixation of mid proximal comminuted left humeral fracture.   Electronically Signed   By: Ivar Drape M.D.   On: 08/27/2013 15:27   Dg Humerus Left  08/27/2013   CLINICAL DATA:  Humerus fracture.  EXAM: LEFT HUMERUS - 2+ VIEW  COMPARISON:  None.  FINDINGS: Patient status post open reduction internal fixation of comminuted fracture of the left humerus. Evidence of prior plate and screw fixation noted. This fracture may be old with nonunion. Repeat fracture at site of prior fracture.  IMPRESSION: Open reduction internal fixation left humeral fracture described above.   Electronically Signed   By: Marcello Moores  Register   On: 08/27/2013 12:44   Dg C-arm 61-120 Min  09/17/2013   CLINICAL DATA:  Humerus fracture.  EXAM: LEFT HUMERUS - 2+ VIEW  COMPARISON:  None.  FINDINGS: Patient  status post open reduction internal fixation of comminuted fracture of the left humerus. Evidence of prior plate and screw fixation noted. This fracture may be old with nonunion. Repeat fracture at site of prior fracture.  IMPRESSION: Open reduction internal fixation left humeral fracture described above.   Electronically Signed   By: Marcello Moores  Register   On: 08/27/2013 12:44   ASSESSMENT AND PLAN  1. Acute cholecystitis s/p open chole 09/17/13  - s/p ERCP 7/16, no retained stone. S/p sphincterotomy  2. Recurrent PAF  - CHA2DS2-Vasc score 3 (CAD/PAD, HTN, female)  - went back into PAF after ERCP 7/16  - back in NSR after addition of diltiazem  - HR 60s with combination of PO dilt and metoprolol, will continue that combination for now given her acute illness. Potentially stay on 1 of the 2 medication once her GI issue completely resolve  - continue lovenox  - Likely need systemic anticoagulation, once her transaminase improve and GI issue resolve, may consider novel agent like eliquis.   Await final ok from IM and GI before starting NOAC 3. CAD s/p prior CABG 1998, last PCI 2013, last cath stable anatomy 07/2013 - Plavix  stopped to avoid triple therapy once NOAC started.  4. PAD with history of RIA occlusion, R femoral thrombectomy, fem-fem BPG  5. Anemia, new, ?post-op related  - hgb 8, down from 11.2 since 4 days ago  6. UTI, per IM    Sueanne Margarita, MD  09/21/2013  8:56 AM

## 2013-09-21 NOTE — Progress Notes (Signed)
5 Days Post-Op  Subjective: Pain control better.  Walking.  Bowels moving.  Tolerating solid diet.  Objective: Vital signs in last 24 hours: Temp:  [97.5 F (36.4 C)-98.2 F (36.8 C)] 97.5 F (36.4 C) (07/18 0500) Pulse Rate:  [56-70] 57 (07/18 0500) Resp:  [18-19] 18 (07/18 0500) BP: (87-111)/(31-51) 105/38 mmHg (07/18 0500) SpO2:  [93 %-98 %] 93 % (07/18 0826) Weight:  [194 lb 7.1 oz (88.2 kg)] 194 lb 7.1 oz (88.2 kg) (07/18 0500) Last BM Date: 09/20/13  Intake/Output from previous day: 07/17 0701 - 07/18 0700 In: 300 [P.O.:300] Out: 1230 [Urine:1200; Drains:30] Intake/Output this shift:    PE: General- In NAD Abdomen-soft, incisions clean and intact, small amount of bilious drain output  Lab Results:   Recent Labs  09/20/13 0330 09/21/13 0359  WBC 4.2 3.9*  HGB 8.0* 8.7*  HCT 25.2* 28.4*  PLT 205 199   BMET  Recent Labs  09/19/13 0615 09/20/13 0330  NA 140 139  K 3.8 4.0  CL 97 97  CO2 28 28  GLUCOSE 91 91  BUN 10 8  CREATININE 0.73 0.75  CALCIUM 8.6 7.8*   PT/INR No results found for this basename: LABPROT, INR,  in the last 72 hours Comprehensive Metabolic Panel:    Component Value Date/Time   NA 139 09/20/2013 0330   NA 140 09/19/2013 0615   K 4.0 09/20/2013 0330   K 3.8 09/19/2013 0615   CL 97 09/20/2013 0330   CL 97 09/19/2013 0615   CO2 28 09/20/2013 0330   CO2 28 09/19/2013 0615   BUN 8 09/20/2013 0330   BUN 10 09/19/2013 0615   CREATININE 0.75 09/20/2013 0330   CREATININE 0.73 09/19/2013 0615   GLUCOSE 91 09/20/2013 0330   GLUCOSE 91 09/19/2013 0615   CALCIUM 7.8* 09/20/2013 0330   CALCIUM 8.6 09/19/2013 0615   AST 57* 09/20/2013 0330   AST 36 09/19/2013 0615   ALT 60* 09/20/2013 0330   ALT 67* 09/19/2013 0615   ALKPHOS 101 09/20/2013 0330   ALKPHOS 100 09/19/2013 0615   BILITOT 0.6 09/20/2013 0330   BILITOT 0.7 09/19/2013 0615   PROT 5.5* 09/20/2013 0330   PROT 6.1 09/19/2013 0615   ALBUMIN 2.0* 09/20/2013 0330   ALBUMIN 2.2* 09/19/2013 0615      Studies/Results: Dg Ercp Biliary & Pancreatic Ducts  09/19/2013   CLINICAL DATA:  Possible common bile duct stone  EXAM: ERCP  TECHNIQUE: Multiple spot images obtained with the fluoroscopic device and submitted for interpretation post-procedure.  COMPARISON:  09/16/2013  FINDINGS: Multiple spot films were obtained during in the ERCP. Transient filling defects are noted consistent with injected air bubbles. No definitive stone is identified. Correlation with the findings during the procedure is recommended.  IMPRESSION: These images were submitted for radiologic interpretation only. Please see the procedural report for the amount of contrast and the fluoroscopy time utilized.   Electronically Signed   By: Inez Catalina M.D.   On: 09/19/2013 15:30    Anti-infectives: Anti-infectives   Start     Dose/Rate Route Frequency Ordered Stop   09/15/13 2130  ciprofloxacin (CIPRO) IVPB 400 mg  Status:  Discontinued     400 mg 200 mL/hr over 60 Minutes Intravenous Every 12 hours 09/15/13 2120 09/20/13 0956   09/15/13 1815  ciprofloxacin (CIPRO) IVPB 400 mg     400 mg 200 mL/hr over 60 Minutes Intravenous  Once 09/15/13 1812 09/15/13 2110      Assessment Principal Problem:  Cholecystitis, acute s/p open cholecystectomy-small volume of bilious drain output otherwise doing well    LOS: 6 days   Plan: Can be discharged with drain and should empty drain and record output twice a day.  Follow up in office mid next week for staple and drain removal.   Daisia Slomski J 09/21/2013

## 2013-09-21 NOTE — Discharge Instructions (Signed)
[facility] ______[facility] LAPAROSCOPIC SURGERY: POST OP INSTRUCTIONS Always review your discharge instruction sheet given to you by the facility where your surgery was performed. IF YOU HAVE DISABILITY OR FAMILY LEAVE FORMS, YOU MUST BRING THEM TO THE OFFICE FOR PROCESSING.   DO NOT GIVE THEM TO YOUR DOCTOR.  1. A prescription for pain medication may be given to you upon discharge.  Take your pain medication as prescribed, if needed.  If narcotic pain medicine is not needed, then you may take acetaminophen (Tylenol) or ibuprofen (Advil) as needed. 2. Take your usually prescribed medications unless otherwise directed. 3. If you need a refill on your pain medication, please contact your pharmacy.  They will contact our office to request authorization. Prescriptions will not be filled after 5pm or on week-ends. 4. You should follow a light diet the first few days after arrival home, such as soup and crackers, etc.  Be sure to include lots of fluids daily. 5. Most patients will experience some swelling and bruising in the area of the incisions.  Ice packs will help.  Swelling and bruising can take several days to resolve.  6. It is common to experience some constipation if taking pain medication after surgery.  Increasing fluid intake and taking a stool softener (such as Colace) will usually help or prevent this problem from occurring.  A mild laxative (Milk of Magnesia or Miralax) should be taken according to package instructions if there are no bowel movements after 48 hours. 7. Unless discharge instructions indicate otherwise, you may remove your bandages 24-48 hours after surgery, and you may shower at that time.  You may have steri-strips (small skin tapes) in place directly over the incision.  These strips should be left on the skin for 7-10 days.  If your surgeon used skin glue on the incision, you may shower in 24 hours.  The glue will flake off over the next 2-3 weeks.  Any sutures or  staples will be removed at the office during your follow-up visit. 8. ACTIVITIES:  You may resume regular (light) daily activities beginning the next day--such as daily self-care, walking, climbing stairs--gradually increasing activities as tolerated.  You may have sexual intercourse when it is comfortable.  Refrain from any heavy lifting or straining until approved by your doctor. a. You may drive when you are no longer taking prescription pain medication, you can comfortably wear a seatbelt, and you can safely maneuver your car and apply brakes. b. RETURN TO WORK:  __________________________________________________________ 9. You should see your doctor in the office for a follow-up appointment in 4-5 days from your discharge date.  Make sure that you call for this appointment within a day or two after you arrive home to insure a convenient appointment time. 10. OTHER INSTRUCTIONS: __Empty drain and record output twice a day.________________________________________________________________________________________________________________________ __________________________________________________________________________________________________________________________ WHEN TO CALL YOUR DOCTOR: 1. Fever over 101.0 2. Inability to urinate 3. Continued bleeding from incision. 4. Increased pain, redness, or drainage from the incision. 5. Increasing abdominal pain  The clinic staff is available to answer your questions during regular business hours.  Please dont hesitate to call and ask to speak to one of the nurses for clinical concerns.  If you have a medical emergency, go to the nearest emergency room or call 911.  A surgeon from [facility] is always on call at the hospital. [address] [place], [place], [place]  [address] ? [address], [place], [place]   [phone] ?  [phone] ? FAX [phone] Web site: [email or web address]

## 2013-09-24 ENCOUNTER — Encounter (HOSPITAL_COMMUNITY): Payer: Self-pay | Admitting: Emergency Medicine

## 2013-09-24 ENCOUNTER — Emergency Department (HOSPITAL_COMMUNITY)
Admission: EM | Admit: 2013-09-24 | Discharge: 2013-09-25 | Disposition: A | Discharge status: Home / Self Care | Payer: Medicare Other | Attending: Emergency Medicine | Admitting: Emergency Medicine

## 2013-09-24 DIAGNOSIS — I739 Peripheral vascular disease, unspecified: Secondary | ICD-10-CM | POA: Insufficient documentation

## 2013-09-24 DIAGNOSIS — R1011 Right upper quadrant pain: Secondary | ICD-10-CM | POA: Insufficient documentation

## 2013-09-24 DIAGNOSIS — J4489 Other specified chronic obstructive pulmonary disease: Secondary | ICD-10-CM | POA: Insufficient documentation

## 2013-09-24 DIAGNOSIS — I252 Old myocardial infarction: Secondary | ICD-10-CM | POA: Diagnosis not present

## 2013-09-24 DIAGNOSIS — Z87891 Personal history of nicotine dependence: Secondary | ICD-10-CM | POA: Insufficient documentation

## 2013-09-24 DIAGNOSIS — F411 Generalized anxiety disorder: Secondary | ICD-10-CM | POA: Diagnosis not present

## 2013-09-24 DIAGNOSIS — Z79899 Other long term (current) drug therapy: Secondary | ICD-10-CM | POA: Diagnosis not present

## 2013-09-24 DIAGNOSIS — Z792 Long term (current) use of antibiotics: Secondary | ICD-10-CM | POA: Diagnosis not present

## 2013-09-24 DIAGNOSIS — Z9861 Coronary angioplasty status: Secondary | ICD-10-CM | POA: Diagnosis not present

## 2013-09-24 DIAGNOSIS — E785 Hyperlipidemia, unspecified: Secondary | ICD-10-CM | POA: Insufficient documentation

## 2013-09-24 DIAGNOSIS — I10 Essential (primary) hypertension: Secondary | ICD-10-CM | POA: Insufficient documentation

## 2013-09-24 DIAGNOSIS — G8929 Other chronic pain: Secondary | ICD-10-CM | POA: Diagnosis not present

## 2013-09-24 DIAGNOSIS — J449 Chronic obstructive pulmonary disease, unspecified: Secondary | ICD-10-CM | POA: Diagnosis not present

## 2013-09-24 DIAGNOSIS — I2510 Atherosclerotic heart disease of native coronary artery without angina pectoris: Secondary | ICD-10-CM | POA: Diagnosis not present

## 2013-09-24 DIAGNOSIS — R5381 Other malaise: Secondary | ICD-10-CM | POA: Diagnosis present

## 2013-09-24 DIAGNOSIS — Z951 Presence of aortocoronary bypass graft: Secondary | ICD-10-CM | POA: Insufficient documentation

## 2013-09-24 DIAGNOSIS — K219 Gastro-esophageal reflux disease without esophagitis: Secondary | ICD-10-CM | POA: Diagnosis not present

## 2013-09-24 DIAGNOSIS — IMO0002 Reserved for concepts with insufficient information to code with codable children: Secondary | ICD-10-CM | POA: Insufficient documentation

## 2013-09-24 DIAGNOSIS — Z7982 Long term (current) use of aspirin: Secondary | ICD-10-CM | POA: Diagnosis not present

## 2013-09-24 DIAGNOSIS — R5383 Other fatigue: Secondary | ICD-10-CM

## 2013-09-24 DIAGNOSIS — Z880 Allergy status to penicillin: Secondary | ICD-10-CM | POA: Diagnosis not present

## 2013-09-24 LAB — CBC WITH DIFFERENTIAL/PLATELET
Basophils Absolute: 0 10*3/uL (ref 0.0–0.1)
Basophils Relative: 1 % (ref 0–1)
Eosinophils Absolute: 0.1 10*3/uL (ref 0.0–0.7)
Eosinophils Relative: 2 % (ref 0–5)
HCT: 31.8 % — ABNORMAL LOW (ref 36.0–46.0)
Hemoglobin: 9.8 g/dL — ABNORMAL LOW (ref 12.0–15.0)
Lymphocytes Relative: 17 % (ref 12–46)
Lymphs Abs: 1.1 10*3/uL (ref 0.7–4.0)
MCH: 29.5 pg (ref 26.0–34.0)
MCHC: 30.8 g/dL (ref 30.0–36.0)
MCV: 95.8 fL (ref 78.0–100.0)
Monocytes Absolute: 0.4 10*3/uL (ref 0.1–1.0)
Monocytes Relative: 6 % (ref 3–12)
Neutro Abs: 4.8 10*3/uL (ref 1.7–7.7)
Neutrophils Relative %: 74 % (ref 43–77)
Platelets: 260 10*3/uL (ref 150–400)
RBC: 3.32 MIL/uL — ABNORMAL LOW (ref 3.87–5.11)
RDW: 14.1 % (ref 11.5–15.5)
WBC: 6.4 10*3/uL (ref 4.0–10.5)

## 2013-09-24 LAB — COMPREHENSIVE METABOLIC PANEL
ALT: 23 U/L (ref 0–35)
AST: 17 U/L (ref 0–37)
Albumin: 2.9 g/dL — ABNORMAL LOW (ref 3.5–5.2)
Alkaline Phosphatase: 95 U/L (ref 39–117)
Anion gap: 14 (ref 5–15)
BUN: 14 mg/dL (ref 6–23)
CO2: 28 mEq/L (ref 19–32)
Calcium: 8.8 mg/dL (ref 8.4–10.5)
Chloride: 101 mEq/L (ref 96–112)
Creatinine, Ser: 0.79 mg/dL (ref 0.50–1.10)
GFR calc Af Amer: >90 mL/min (ref >90)
GFR calc non Af Amer: 87 mL/min — ABNORMAL LOW (ref >90)
Glucose, Bld: 102 mg/dL — ABNORMAL HIGH (ref 70–99)
Potassium: 3.8 mEq/L (ref 3.7–5.3)
Sodium: 143 mEq/L (ref 137–147)
Total Bilirubin: 0.3 mg/dL (ref 0.3–1.2)
Total Protein: 7 g/dL (ref 6.0–8.3)

## 2013-09-24 LAB — CULTURE, BLOOD (ROUTINE X 2): Culture: NO GROWTH

## 2013-09-24 LAB — URINALYSIS, ROUTINE W REFLEX MICROSCOPIC
Glucose, UA: NEGATIVE mg/dL
Hgb urine dipstick: NEGATIVE
Ketones, ur: NEGATIVE mg/dL
Leukocytes, UA: NEGATIVE
Nitrite: NEGATIVE
Protein, ur: NEGATIVE mg/dL
Specific Gravity, Urine: 1.04 — ABNORMAL HIGH (ref 1.005–1.030)
Urobilinogen, UA: 1 mg/dL (ref 0.0–1.0)
pH: 5.5 (ref 5.0–8.0)

## 2013-09-24 LAB — LIPASE, BLOOD: Lipase: 8 U/L — ABNORMAL LOW (ref 11–59)

## 2013-09-24 LAB — I-STAT TROPONIN, ED: Troponin i, poc: 0.01 ng/mL (ref 0.00–0.08)

## 2013-09-24 MED ORDER — MORPHINE SULFATE 4 MG/ML IJ SOLN
4.0000 mg | Freq: Once | INTRAMUSCULAR | Status: AC
Start: 2013-09-24 — End: 2013-09-24
  Administered 2013-09-24: 4 mg via INTRAVENOUS
  Filled 2013-09-24: qty 1

## 2013-09-24 MED ORDER — SODIUM CHLORIDE 0.9 % IV BOLUS (SEPSIS)
1000.0000 mL | Freq: Once | INTRAVENOUS | Status: AC
  Administered 2013-09-24: 1000 mL via INTRAVENOUS

## 2013-09-24 NOTE — ED Provider Notes (Signed)
CSN: [id number]     Arrival date & time 09/24/13  1719 History   First MD Initiated Contact with Patient 09/24/13 2001     Chief Complaint  Patient presents with  . Weakness     (Consider location/radiation/quality/duration/timing/severity/associated sxs/prior Treatment) Patient is a 63 y.o. female presenting with general illness.  Illness Location:  Generalized Quality:  Fatigue Severity:  Mild Onset quality:  Gradual Duration:  1 week (Since surgery) Timing:  Constant Progression:  Unchanged Chronicity:  New Context:  Recent gallbladder surgery Relieved by:  None tried Ineffective treatments:  None tried Associated symptoms: abdominal pain (mild, associated with abdominal incision) and fatigue   Associated symptoms: no chest pain, no cough, no diarrhea, no fever, no headaches, no nausea, no rash, no shortness of breath, no sore throat and no vomiting     Past Medical History  Diagnosis Date  . Glucose intolerance (impaired glucose tolerance)   . Hypertension   . PAD (peripheral artery disease)   . COPD (chronic obstructive pulmonary disease)   . Peripheral neuropathy   . Chronic back pain   . Chronic leg pain   . CAD (coronary artery disease), native coronary artery     Hx CABG 1998, last PCI 2013; LHC 07/2013:  EF 55-60%, L-LAD prob atretic, no sig disease in LAD, S-OM1 ok with patent stent, S-dRCA ok => med Rx.  Marland Kitchen HLD (hyperlipidemia)   . Myocardial infarction   . Sleep apnea   . Shortness of breath     when walking  . Anxiety   . GERD (gastroesophageal reflux disease)    Past Surgical History  Procedure Laterality Date  . Cardiac surgery  1998    LTA-LAD, SVG-OM, SVG-RCA  . Coronary angioplasty with stent placement  11/2011, 02/2012    DES to SVG-RCA both times, In [place], [place], Dr. Bruce Donath  . Cervical spine surgery  1991 and again several years later  . Knee arthroscopy Right 1998  . Finger surgery Right     ring finger  . Orif humerus fracture Left  08/27/2013    Procedure: OPEN REDUCTION INTERNAL FIXATION (ORIF) LEFT HUMERUS ;  Surgeon: Rozanna Box, MD;  Location: [facility];  Service: Orthopedics;  Laterality: Left;  . Cholecystectomy  09/17/2013    dr [name]  . Cholecystectomy N/A 09/16/2013    Procedure: LAPAROSCOPIC CHOLECYSTECTOMY CONVERTED TO OPEN CHOLECYSTECTOMY WITH CHOLANGIOGRAM;  Surgeon: [name], MD;  Location: [facility];  Service: General;  Laterality: N/A;  . Ercp N/A 09/19/2013    Procedure: ENDOSCOPIC RETROGRADE CHOLANGIOPANCREATOGRAPHY (ERCP);  Surgeon: [name], MD;  Location: [facility];  Service: Endoscopy;  Laterality: N/A;   Family History  Problem Relation Age of Onset  . Lung cancer Mother    History  Substance Use Topics  . Smoking status: Former Smoker    Quit date: 07/20/1998  . Smokeless tobacco: Never Used  . Alcohol Use: No   OB History   Grav Para Term Preterm Abortions TAB SAB Ect Mult Living                 Review of Systems  Constitutional: Positive for fatigue. Negative for fever and chills.  HENT: Negative for sore throat.   Eyes: Negative for pain.  Respiratory: Negative for cough and shortness of breath.   Cardiovascular: Negative for chest pain.  Gastrointestinal: Positive for abdominal pain (mild, associated with abdominal incision). Negative for nausea, vomiting and diarrhea.  Genitourinary: Negative for dysuria.  Musculoskeletal: Negative for back  pain.  Skin: Negative for rash.  Neurological: Negative for numbness and headaches.      Allergies  Benadryl; Adhesive; Amoxicillin; and Flexeril  Home Medications   Prior to Admission medications   Medication Sig Start Date End Date Taking? Authorizing Provider  Aclidinium Bromide (TUDORZA PRESSAIR) 400 MCG/ACT AEPB Inhale 1 puff into the lungs daily.   Yes Historical Provider, MD  albuterol (PROVENTIL HFA;VENTOLIN HFA) 108 (90 BASE) MCG/ACT inhaler Inhale 2 puffs into the lungs every 6 (six) hours as needed for  wheezing or shortness of breath.   Yes Historical Provider, MD  ALPRAZolam Duanne Moron) 0.5 MG tablet Take 1 tablet (0.5 mg total) by mouth 3 (three) times daily as needed for anxiety. 09/21/13  Yes Ripudeep Krystal Eaton, MD  aspirin EC 81 MG tablet Take 81 mg by mouth daily.   Yes Historical Provider, MD  atorvastatin (LIPITOR) 10 MG tablet Take 1 tablet (10 mg total) by mouth at bedtime. 09/21/13  Yes Ripudeep Krystal Eaton, MD  beclomethasone (QVAR) 80 MCG/ACT inhaler Inhale 2 puffs into the lungs 2 (two) times daily.   Yes Historical Provider, MD  cetirizine (ZYRTEC) 10 MG tablet Take 10 mg by mouth daily.   Yes Historical Provider, MD  ciprofloxacin (CIPRO) 500 MG tablet Take 1 tablet (500 mg total) by mouth 2 (two) times daily. X 1 week 09/21/13  Yes Ripudeep Krystal Eaton, MD  Cyanocobalamin 1000 MCG/ML KIT Inject 1,000 mcg as directed every 30 (thirty) days. 15th of each month   Yes Historical Provider, MD  docusate sodium 100 MG CAPS Take 100 mg by mouth 2 (two) times daily. 08/28/13  Yes Jari Pigg, PA-C  ezetimibe (ZETIA) 10 MG tablet Take 10 mg by mouth daily.   Yes Historical Provider, MD  Fluticasone-Salmeterol (ADVAIR) 250-50 MCG/DOSE AEPB Inhale 1 puff into the lungs 2 (two) times daily.   Yes Historical Provider, MD  methocarbamol (ROBAXIN) 500 MG tablet Take 1-2 tablets (500-1,000 mg total) by mouth every 6 (six) hours as needed for muscle spasms. 08/28/13  Yes Jari Pigg, PA-C  metoprolol (LOPRESSOR) 100 MG tablet Take 1 tablet (100 mg total) by mouth 2 (two) times daily. 09/22/13  Yes Ripudeep Krystal Eaton, MD  mometasone-formoterol (DULERA) 100-5 MCG/ACT AERO Inhale 2 puffs into the lungs 2 (two) times daily as needed for wheezing.   Yes Historical Provider, MD  nitroGLYCERIN (NITROSTAT) 0.4 MG SL tablet Place 0.4 mg under the tongue every 5 (five) minutes as needed for chest pain.   Yes Historical Provider, MD  omeprazole (PRILOSEC) 40 MG capsule Take 40 mg by mouth 2 (two) times daily.   Yes Historical Provider, MD   ondansetron (ZOFRAN ODT) 4 MG disintegrating tablet Take 1 tablet (4 mg total) by mouth every 8 (eight) hours as needed for nausea or vomiting. 09/21/13  Yes Ripudeep Krystal Eaton, MD  oxyCODONE-acetaminophen (PERCOCET/ROXICET) 5-325 MG per tablet Take 1-2 tablets by mouth every 6 (six) hours as needed for severe pain. 09/21/13  Yes Ripudeep K Rai, MD   BP 102/60  Pulse 57  Temp(Src) 97.8 F (36.6 C) (Oral)  Resp 12  Wt 198 lb 2 oz (89.869 kg)  SpO2 92% Physical Exam  Constitutional: She is oriented to person, place, and time. She appears well-developed and well-nourished. She appears ill. No distress.  HENT:  Head: Normocephalic and atraumatic.  Eyes: Pupils are equal, round, and reactive to light. Right eye exhibits no discharge. Left eye exhibits no discharge.  Neck: Normal range of motion.  Cardiovascular: Normal rate, regular rhythm and normal heart sounds.   Pulmonary/Chest: Effort normal and breath sounds normal.  Abdominal: Soft. She exhibits no distension. There is tenderness in the right upper quadrant. There is negative Murphy's sign.  RUQ surgical scarring. Incision site without evidence of infection. Drain in place, with serosanguinous drainage appreciated.   Musculoskeletal: Normal range of motion.  Neurological: She is alert and oriented to person, place, and time.  Skin: Skin is warm. She is not diaphoretic.    ED Course  Procedures (including critical care time) Labs Review Labs Reviewed  CBC WITH DIFFERENTIAL - Abnormal; Notable for the following:    RBC 3.32 (*)    Hemoglobin 9.8 (*)    HCT 31.8 (*)    All other components within normal limits  COMPREHENSIVE METABOLIC PANEL - Abnormal; Notable for the following:    Glucose, Bld 102 (*)    Albumin 2.9 (*)    GFR calc non Af Amer 87 (*)    All other components within normal limits  LIPASE, BLOOD - Abnormal; Notable for the following:    Lipase 8 (*)    All other components within normal limits  URINALYSIS, ROUTINE  W REFLEX MICROSCOPIC - Abnormal; Notable for the following:    Color, Urine AMBER (*)    APPearance CLOUDY (*)    Specific Gravity, Urine 1.040 (*)    Bilirubin Urine SMALL (*)    All other components within normal limits  CULTURE, BLOOD (SINGLE)  CULTURE, BLOOD (SINGLE)  I-STAT TROPOININ, ED    Imaging Review No results found.   EKG Interpretation None      MDM   Final diagnoses:  Other fatigue   63 yo F with PMHx as above, sig for recent gallbladder surgery (09/15/13) who presents with weakness and fatigue.   Patient appears tired upon arrival. AF, HDS, in NAD. Patient surgical site looks appropriate, with no evidence of infection. Nontender to abdomen, soft. Patient was told to come to ED by PCP given positive blood culture. 1 BC grew out staph aureus (coagulase negative) in 1/2 blood cultures. Likely contaminant. Patient afebrile at this time, without a white count. No evidence of SIRS / sepsis. Will recommend follow-up with PCP / surgeon for post-surgical wound check. Patient and family member comfortable with this plan. Strict return precautions given. Discharged in stable condition. Patient seen and evaluated by myself and my attending, Dr. Kathrynn Humble.       Freddi Che, MD 09/25/13 [phone]

## 2013-09-24 NOTE — ED Notes (Signed)
Pt ambulated to get urine sample without distress.

## 2013-09-24 NOTE — ED Notes (Signed)
Pt was dc home on 7/12 following gallbladder surgery. Family reports pt having fatigue weakness and not feeling well since surgery. Was going to pcp office today for eval but was called and told to come here for possible infection/sepsis. Appears pale and weak at triage, airway intact.

## 2013-09-25 DIAGNOSIS — R5383 Other fatigue: Secondary | ICD-10-CM | POA: Diagnosis not present

## 2013-09-25 DIAGNOSIS — R5381 Other malaise: Secondary | ICD-10-CM | POA: Diagnosis not present

## 2013-09-26 NOTE — ED Provider Notes (Signed)
I saw and evaluated the patient, reviewed the resident's note and I agree with the findings and plan.   EKG Interpretation None      Pt sent to the ER for + blood cultures. Recent cholecystectomy. Pt had culture from 1 week ago  -1/2 tubes +. Pt has generalized weakness, but she denies any fevers, chills. Pt has 0 SIRS criteria. Suspect that the culture is a contaminant. Repeat cultures added to be safe, as she is feeling weak, and had recent instrumentation. Abd exam is benign, no indication for CT. Advised to set up appt with Surgery as recommended. Pt and sister ok with the plan,  Varney Biles, MD 09/26/13 [phone]

## 2013-10-01 LAB — CULTURE, BLOOD (SINGLE)
Culture: NO GROWTH
Culture: NO GROWTH

## 2013-10-08 ENCOUNTER — Encounter (INDEPENDENT_AMBULATORY_CARE_PROVIDER_SITE_OTHER): Payer: Self-pay | Admitting: Surgery

## 2013-10-08 ENCOUNTER — Ambulatory Visit (INDEPENDENT_AMBULATORY_CARE_PROVIDER_SITE_OTHER): Payer: Medicare Other | Admitting: Surgery

## 2013-10-08 VITALS — BP 102/70 | HR 78 | Temp 96.9°F | Resp 18 | Ht 62.0 in | Wt 182.4 lb

## 2013-10-08 DIAGNOSIS — R109 Unspecified abdominal pain: Secondary | ICD-10-CM

## 2013-10-08 DIAGNOSIS — G8918 Other acute postprocedural pain: Secondary | ICD-10-CM

## 2013-10-08 MED ORDER — OXYCODONE-ACETAMINOPHEN 5-325 MG PO TABS: 1.0000 | ORAL_TABLET | Freq: Four times a day (QID) | ORAL | Status: DC | PRN

## 2013-10-08 NOTE — Progress Notes (Signed)
Subjective:     Patient ID: Wendy [name], female   DOB: 01/24/1951, 63 y.o.   MRN: [id number]  HPI She is here for her first postop visit status post laparoscopic converted open cholecystectomy for a severe abscessed gallbladder on 7/13.  She has been feeling poorly since surgery. Her vitals have remained normal. Her primary care physician ordered labs last week which were virtually normal as well as blood cultures which are negative. She reports nausea. She removed her drain herself last week.  Review of Systems     Objective:   Physical Exam On exam, her abdomen is soft and nontender. Her incisions are well healed and I removed her staples. She does not appear jaundiced    Assessment:     Patient continuing to recover from a difficult cholecystectomy.     Plan:     I encouraged her drink plenty of fluids. I will review her pain medications. I am going to check her CBC and CMP again to make sure there are no abnormalities noted her drain has been removed. I will see her back in 2 weeks

## 2013-10-17 ENCOUNTER — Telehealth (INDEPENDENT_AMBULATORY_CARE_PROVIDER_SITE_OTHER): Payer: Self-pay

## 2013-10-17 ENCOUNTER — Encounter (HOSPITAL_COMMUNITY): Payer: Medicare Other | Admitting: Anesthesiology

## 2013-10-17 ENCOUNTER — Encounter (HOSPITAL_COMMUNITY): Payer: Self-pay | Admitting: Emergency Medicine

## 2013-10-17 ENCOUNTER — Emergency Department (HOSPITAL_COMMUNITY): Payer: Medicare Other | Admitting: Anesthesiology

## 2013-10-17 ENCOUNTER — Encounter (HOSPITAL_COMMUNITY): Admission: EM | Disposition: A | Discharge status: Home Health | Payer: Self-pay | Source: Home / Self Care

## 2013-10-17 ENCOUNTER — Inpatient Hospital Stay (HOSPITAL_COMMUNITY): Payer: Medicare Other

## 2013-10-17 ENCOUNTER — Inpatient Hospital Stay: Admit: 2013-10-17 | Payer: Self-pay | Admitting: Surgery

## 2013-10-17 ENCOUNTER — Inpatient Hospital Stay (HOSPITAL_COMMUNITY)
Admission: EM | Admit: 2013-10-17 | Discharge: 2013-10-22 | DRG: 863 | Disposition: A | Discharge status: Home Health | Payer: Medicare Other | Attending: Surgery | Admitting: Surgery

## 2013-10-17 DIAGNOSIS — G4730 Sleep apnea, unspecified: Secondary | ICD-10-CM | POA: Diagnosis present

## 2013-10-17 DIAGNOSIS — E669 Obesity, unspecified: Secondary | ICD-10-CM | POA: Diagnosis present

## 2013-10-17 DIAGNOSIS — I739 Peripheral vascular disease, unspecified: Secondary | ICD-10-CM | POA: Diagnosis present

## 2013-10-17 DIAGNOSIS — K219 Gastro-esophageal reflux disease without esophagitis: Secondary | ICD-10-CM

## 2013-10-17 DIAGNOSIS — Z87891 Personal history of nicotine dependence: Secondary | ICD-10-CM | POA: Diagnosis not present

## 2013-10-17 DIAGNOSIS — L02219 Cutaneous abscess of trunk, unspecified: Secondary | ICD-10-CM | POA: Diagnosis present

## 2013-10-17 DIAGNOSIS — R0789 Other chest pain: Secondary | ICD-10-CM

## 2013-10-17 DIAGNOSIS — T8140XA Infection following a procedure, unspecified, initial encounter: Secondary | ICD-10-CM

## 2013-10-17 DIAGNOSIS — Z6833 Body mass index (BMI) 33.0-33.9, adult: Secondary | ICD-10-CM | POA: Diagnosis not present

## 2013-10-17 DIAGNOSIS — Z951 Presence of aortocoronary bypass graft: Secondary | ICD-10-CM

## 2013-10-17 DIAGNOSIS — J4489 Other specified chronic obstructive pulmonary disease: Secondary | ICD-10-CM | POA: Diagnosis present

## 2013-10-17 DIAGNOSIS — I2510 Atherosclerotic heart disease of native coronary artery without angina pectoris: Secondary | ICD-10-CM | POA: Diagnosis present

## 2013-10-17 DIAGNOSIS — Y838 Other surgical procedures as the cause of abnormal reaction of the patient, or of later complication, without mention of misadventure at the time of the procedure: Secondary | ICD-10-CM | POA: Diagnosis present

## 2013-10-17 DIAGNOSIS — T8131XA Disruption of external operation (surgical) wound, not elsewhere classified, initial encounter: Secondary | ICD-10-CM | POA: Diagnosis present

## 2013-10-17 DIAGNOSIS — E785 Hyperlipidemia, unspecified: Secondary | ICD-10-CM | POA: Diagnosis present

## 2013-10-17 DIAGNOSIS — I10 Essential (primary) hypertension: Secondary | ICD-10-CM | POA: Diagnosis present

## 2013-10-17 DIAGNOSIS — R079 Chest pain, unspecified: Secondary | ICD-10-CM | POA: Diagnosis present

## 2013-10-17 DIAGNOSIS — J449 Chronic obstructive pulmonary disease, unspecified: Secondary | ICD-10-CM | POA: Diagnosis present

## 2013-10-17 DIAGNOSIS — Z9089 Acquired absence of other organs: Secondary | ICD-10-CM

## 2013-10-17 DIAGNOSIS — L03319 Cellulitis of trunk, unspecified: Secondary | ICD-10-CM | POA: Diagnosis present

## 2013-10-17 DIAGNOSIS — T8149XA Infection following a procedure, other surgical site, initial encounter: Secondary | ICD-10-CM | POA: Diagnosis present

## 2013-10-17 HISTORY — PX: IRRIGATION AND DEBRIDEMENT ABSCESS: SHX5252

## 2013-10-17 LAB — COMPREHENSIVE METABOLIC PANEL
ALT: 14 U/L (ref 0–35)
AST: 25 U/L (ref 0–37)
Albumin: 2.7 g/dL — ABNORMAL LOW (ref 3.5–5.2)
Alkaline Phosphatase: 102 U/L (ref 39–117)
Anion gap: 19 — ABNORMAL HIGH (ref 5–15)
BUN: 22 mg/dL (ref 6–23)
CO2: 26 mEq/L (ref 19–32)
Calcium: 10.2 mg/dL (ref 8.4–10.5)
Chloride: 90 mEq/L — ABNORMAL LOW (ref 96–112)
Creatinine, Ser: 0.9 mg/dL (ref 0.50–1.10)
GFR calc Af Amer: 78 mL/min — ABNORMAL LOW (ref >90)
GFR calc non Af Amer: 67 mL/min — ABNORMAL LOW (ref >90)
Glucose, Bld: 105 mg/dL — ABNORMAL HIGH (ref 70–99)
Potassium: 3.7 mEq/L (ref 3.7–5.3)
Sodium: 135 mEq/L — ABNORMAL LOW (ref 137–147)
Total Bilirubin: 0.4 mg/dL (ref 0.3–1.2)
Total Protein: 8.4 g/dL — ABNORMAL HIGH (ref 6.0–8.3)

## 2013-10-17 LAB — CBC WITH DIFFERENTIAL/PLATELET
Basophils Absolute: 0 10*3/uL (ref 0.0–0.1)
Basophils Relative: 0 % (ref 0–1)
Eosinophils Absolute: 0 10*3/uL (ref 0.0–0.7)
Eosinophils Relative: 0 % (ref 0–5)
HCT: 35.5 % — ABNORMAL LOW (ref 36.0–46.0)
Hemoglobin: 11 g/dL — ABNORMAL LOW (ref 12.0–15.0)
Lymphocytes Relative: 12 % (ref 12–46)
Lymphs Abs: 1.3 10*3/uL (ref 0.7–4.0)
MCH: 26.6 pg (ref 26.0–34.0)
MCHC: 31 g/dL (ref 30.0–36.0)
MCV: 85.7 fL (ref 78.0–100.0)
Monocytes Absolute: 0.4 10*3/uL (ref 0.1–1.0)
Monocytes Relative: 4 % (ref 3–12)
Neutro Abs: 8.3 10*3/uL — ABNORMAL HIGH (ref 1.7–7.7)
Neutrophils Relative %: 84 % — ABNORMAL HIGH (ref 43–77)
Platelets: 296 10*3/uL (ref 150–400)
RBC: 4.14 MIL/uL (ref 3.87–5.11)
RDW: 15.2 % (ref 11.5–15.5)
WBC: 10.1 10*3/uL (ref 4.0–10.5)

## 2013-10-17 SURGERY — IRRIGATION AND DEBRIDEMENT ABSCESS
Anesthesia: General | Laterality: Right

## 2013-10-17 MED ORDER — PANTOPRAZOLE SODIUM 40 MG IV SOLR
40.0000 mg | Freq: Every day | INTRAVENOUS | Status: DC
  Administered 2013-10-17: 40 mg via INTRAVENOUS
  Filled 2013-10-17 (×2): qty 40

## 2013-10-17 MED ORDER — KCL IN DEXTROSE-NACL 20-5-0.45 MEQ/L-%-% IV SOLN: INTRAVENOUS | Status: DC

## 2013-10-17 MED ORDER — ALPRAZOLAM 0.5 MG PO TABS
0.5000 mg | ORAL_TABLET | Freq: Three times a day (TID) | ORAL | Status: DC | PRN
  Administered 2013-10-17 – 2013-10-19 (×4): 0.5 mg via ORAL
  Filled 2013-10-17 (×4): qty 1

## 2013-10-17 MED ORDER — LACTATED RINGERS IV SOLN
INTRAVENOUS | Status: DC | PRN
  Administered 2013-10-17: 17:00:00 via INTRAVENOUS

## 2013-10-17 MED ORDER — HYDROGEN PEROXIDE 3 % EX SOLN
CUTANEOUS | Status: AC
  Filled 2013-10-17: qty 473

## 2013-10-17 MED ORDER — HYDROCODONE-ACETAMINOPHEN 5-325 MG PO TABS: 1.0000 | ORAL_TABLET | Freq: Four times a day (QID) | ORAL | Status: DC | PRN

## 2013-10-17 MED ORDER — MIDAZOLAM HCL 2 MG/2ML IJ SOLN
INTRAMUSCULAR | Status: AC
  Filled 2013-10-17: qty 2

## 2013-10-17 MED ORDER — PIPERACILLIN-TAZOBACTAM 3.375 G IVPB 30 MIN: 3.3750 g | Freq: Once | INTRAVENOUS | Status: DC

## 2013-10-17 MED ORDER — PROPOFOL 10 MG/ML IV BOLUS
INTRAVENOUS | Status: AC
  Filled 2013-10-17: qty 20

## 2013-10-17 MED ORDER — PROMETHAZINE HCL 25 MG/ML IJ SOLN: 6.2500 mg | INTRAMUSCULAR | Status: DC | PRN

## 2013-10-17 MED ORDER — DEXAMETHASONE SODIUM PHOSPHATE 10 MG/ML IJ SOLN
INTRAMUSCULAR | Status: DC | PRN
  Administered 2013-10-17: 10 mg via INTRAVENOUS

## 2013-10-17 MED ORDER — ACETAMINOPHEN 325 MG PO TABS
650.0000 mg | ORAL_TABLET | Freq: Four times a day (QID) | ORAL | Status: DC | PRN
  Administered 2013-10-19: 650 mg via ORAL
  Filled 2013-10-17: qty 2

## 2013-10-17 MED ORDER — PHENYLEPHRINE HCL 10 MG/ML IJ SOLN
INTRAMUSCULAR | Status: DC | PRN
  Administered 2013-10-17 (×2): 40 ug via INTRAVENOUS
  Administered 2013-10-17: 20 ug via INTRAVENOUS

## 2013-10-17 MED ORDER — FENTANYL CITRATE 0.05 MG/ML IJ SOLN
INTRAMUSCULAR | Status: AC
  Filled 2013-10-17: qty 2

## 2013-10-17 MED ORDER — ALBUTEROL SULFATE HFA 108 (90 BASE) MCG/ACT IN AERS: 2.0000 | INHALATION_SPRAY | Freq: Four times a day (QID) | RESPIRATORY_TRACT | Status: DC | PRN

## 2013-10-17 MED ORDER — PIPERACILLIN-TAZOBACTAM 3.375 G IVPB
INTRAVENOUS | Status: AC
  Filled 2013-10-17: qty 50

## 2013-10-17 MED ORDER — HYDROMORPHONE HCL PF 1 MG/ML IJ SOLN
INTRAMUSCULAR | Status: DC | PRN
  Administered 2013-10-17 (×2): .5 mg via INTRAVENOUS

## 2013-10-17 MED ORDER — ALBUTEROL SULFATE (2.5 MG/3ML) 0.083% IN NEBU: 2.5000 mg | INHALATION_SOLUTION | Freq: Four times a day (QID) | RESPIRATORY_TRACT | Status: DC | PRN

## 2013-10-17 MED ORDER — ASPIRIN EC 81 MG PO TBEC
81.0000 mg | DELAYED_RELEASE_TABLET | Freq: Every day | ORAL | Status: DC
  Administered 2013-10-17 – 2013-10-22 (×6): 81 mg via ORAL
  Filled 2013-10-17 (×6): qty 1

## 2013-10-17 MED ORDER — LACTATED RINGERS IV SOLN: INTRAVENOUS | Status: DC

## 2013-10-17 MED ORDER — NITROGLYCERIN 0.4 MG SL SUBL: 0.4000 mg | SUBLINGUAL_TABLET | SUBLINGUAL | Status: DC | PRN

## 2013-10-17 MED ORDER — LIDOCAINE HCL (CARDIAC) 20 MG/ML IV SOLN
INTRAVENOUS | Status: DC | PRN
  Administered 2013-10-17: 75 mg via INTRAVENOUS

## 2013-10-17 MED ORDER — ACETAMINOPHEN 10 MG/ML IV SOLN
1000.0000 mg | Freq: Four times a day (QID) | INTRAVENOUS | Status: DC | PRN
  Administered 2013-10-17: 1000 mg via INTRAVENOUS
  Filled 2013-10-17: qty 100

## 2013-10-17 MED ORDER — FLUTICASONE PROPIONATE HFA 44 MCG/ACT IN AERO
2.0000 | INHALATION_SPRAY | Freq: Two times a day (BID) | RESPIRATORY_TRACT | Status: DC
  Administered 2013-10-17 – 2013-10-22 (×9): 2 via RESPIRATORY_TRACT
  Filled 2013-10-17: qty 10.6

## 2013-10-17 MED ORDER — SODIUM CHLORIDE 0.9 % IV SOLN
INTRAVENOUS | Status: DC
  Administered 2013-10-17: 16:00:00 via INTRAVENOUS

## 2013-10-17 MED ORDER — SODIUM CHLORIDE 0.9 % IV BOLUS (SEPSIS): 1000.0000 mL | Freq: Once | INTRAVENOUS | Status: DC

## 2013-10-17 MED ORDER — ACETAMINOPHEN 650 MG RE SUPP: 650.0000 mg | Freq: Four times a day (QID) | RECTAL | Status: DC | PRN

## 2013-10-17 MED ORDER — MEPERIDINE HCL 50 MG/ML IJ SOLN: 6.2500 mg | INTRAMUSCULAR | Status: DC | PRN

## 2013-10-17 MED ORDER — LIDOCAINE HCL (CARDIAC) 20 MG/ML IV SOLN
INTRAVENOUS | Status: AC
  Filled 2013-10-17: qty 5

## 2013-10-17 MED ORDER — FENTANYL CITRATE 0.05 MG/ML IJ SOLN
25.0000 ug | INTRAMUSCULAR | Status: DC | PRN
  Administered 2013-10-17: 25 ug via INTRAVENOUS
  Administered 2013-10-17 (×2): 50 ug via INTRAVENOUS
  Administered 2013-10-17: 25 ug via INTRAVENOUS

## 2013-10-17 MED ORDER — PIPERACILLIN-TAZOBACTAM 3.375 G IVPB
3.3750 g | Freq: Three times a day (TID) | INTRAVENOUS | Status: DC
  Administered 2013-10-17 – 2013-10-22 (×15): 3.375 g via INTRAVENOUS
  Filled 2013-10-17 (×15): qty 50

## 2013-10-17 MED ORDER — FENTANYL CITRATE 0.05 MG/ML IJ SOLN
INTRAMUSCULAR | Status: DC | PRN
  Administered 2013-10-17 (×3): 50 ug via INTRAVENOUS
  Administered 2013-10-17: 100 ug via INTRAVENOUS
  Administered 2013-10-17 (×2): 50 ug via INTRAVENOUS

## 2013-10-17 MED ORDER — 0.9 % SODIUM CHLORIDE (POUR BTL) OPTIME
TOPICAL | Status: DC | PRN
  Administered 2013-10-17: 2000 mL

## 2013-10-17 MED ORDER — PROPOFOL 10 MG/ML IV BOLUS
INTRAVENOUS | Status: DC | PRN
  Administered 2013-10-17: 150 mg via INTRAVENOUS
  Administered 2013-10-17: 50 mg via INTRAVENOUS

## 2013-10-17 MED ORDER — METOPROLOL TARTRATE 100 MG PO TABS
100.0000 mg | ORAL_TABLET | Freq: Two times a day (BID) | ORAL | Status: DC
  Administered 2013-10-17: 100 mg via ORAL
  Filled 2013-10-17 (×3): qty 1

## 2013-10-17 MED ORDER — HYDROMORPHONE HCL PF 1 MG/ML IJ SOLN
INTRAMUSCULAR | Status: AC
  Filled 2013-10-17: qty 1

## 2013-10-17 MED ORDER — HYDROMORPHONE HCL PF 1 MG/ML IJ SOLN
0.5000 mg | INTRAMUSCULAR | Status: DC | PRN
  Administered 2013-10-18 (×3): 1 mg via INTRAVENOUS
  Administered 2013-10-19: 2 mg via INTRAVENOUS
  Administered 2013-10-19: 1 mg via INTRAVENOUS
  Administered 2013-10-19 (×2): 2 mg via INTRAVENOUS
  Administered 2013-10-19: 1 mg via INTRAVENOUS
  Administered 2013-10-20 (×6): 2 mg via INTRAVENOUS
  Administered 2013-10-20 (×2): 1 mg via INTRAVENOUS
  Administered 2013-10-21 (×2): 2 mg via INTRAVENOUS
  Administered 2013-10-21 – 2013-10-22 (×4): 1 mg via INTRAVENOUS
  Administered 2013-10-22: 2 mg via INTRAVENOUS
  Filled 2013-10-17: qty 2
  Filled 2013-10-17 (×3): qty 1
  Filled 2013-10-17: qty 2
  Filled 2013-10-17: qty 1
  Filled 2013-10-17 (×2): qty 2
  Filled 2013-10-17: qty 1
  Filled 2013-10-17 (×3): qty 2
  Filled 2013-10-17: qty 1
  Filled 2013-10-17: qty 2
  Filled 2013-10-17: qty 1
  Filled 2013-10-17 (×4): qty 2
  Filled 2013-10-17 (×3): qty 1
  Filled 2013-10-17 (×3): qty 2

## 2013-10-17 MED ORDER — MIDAZOLAM HCL 5 MG/5ML IJ SOLN
INTRAMUSCULAR | Status: DC | PRN
  Administered 2013-10-17: 2 mg via INTRAVENOUS

## 2013-10-17 MED ORDER — TIOTROPIUM BROMIDE MONOHYDRATE 18 MCG IN CAPS
18.0000 ug | ORAL_CAPSULE | Freq: Every day | RESPIRATORY_TRACT | Status: DC
  Administered 2013-10-18 – 2013-10-22 (×5): 18 ug via RESPIRATORY_TRACT
  Filled 2013-10-17: qty 5

## 2013-10-17 MED ORDER — MORPHINE SULFATE 2 MG/ML IJ SOLN
1.0000 mg | INTRAMUSCULAR | Status: DC | PRN
  Administered 2013-10-17 (×2): 4 mg via INTRAVENOUS
  Filled 2013-10-17 (×2): qty 2

## 2013-10-17 MED ORDER — FENTANYL CITRATE 0.05 MG/ML IJ SOLN
INTRAMUSCULAR | Status: AC
  Filled 2013-10-17: qty 5

## 2013-10-17 MED ORDER — KCL IN DEXTROSE-NACL 20-5-0.45 MEQ/L-%-% IV SOLN
INTRAVENOUS | Status: DC
  Administered 2013-10-17 – 2013-10-19 (×5): via INTRAVENOUS
  Administered 2013-10-19: 125 mL/h via INTRAVENOUS
  Administered 2013-10-19 – 2013-10-20 (×3): via INTRAVENOUS
  Administered 2013-10-21 – 2013-10-22 (×3): 125 mL/h via INTRAVENOUS
  Administered 2013-10-22: 02:00:00 via INTRAVENOUS
  Filled 2013-10-17 (×15): qty 1000

## 2013-10-17 MED ORDER — SUCCINYLCHOLINE CHLORIDE 20 MG/ML IJ SOLN
INTRAMUSCULAR | Status: DC | PRN
  Administered 2013-10-17: 120 mg via INTRAVENOUS

## 2013-10-17 MED ORDER — MOMETASONE FURO-FORMOTEROL FUM 100-5 MCG/ACT IN AERO
2.0000 | INHALATION_SPRAY | Freq: Two times a day (BID) | RESPIRATORY_TRACT | Status: DC
  Administered 2013-10-17 – 2013-10-22 (×9): 2 via RESPIRATORY_TRACT
  Filled 2013-10-17: qty 8.8

## 2013-10-17 MED ORDER — IOHEXOL 300 MG/ML  SOLN
100.0000 mL | Freq: Once | INTRAMUSCULAR | Status: AC | PRN
  Administered 2013-10-17: 100 mL via INTRAVENOUS

## 2013-10-17 MED ORDER — ONDANSETRON HCL 4 MG/2ML IJ SOLN
4.0000 mg | Freq: Four times a day (QID) | INTRAMUSCULAR | Status: DC | PRN
  Administered 2013-10-21: 4 mg via INTRAVENOUS
  Filled 2013-10-17: qty 2

## 2013-10-17 MED ORDER — ONDANSETRON HCL 4 MG/2ML IJ SOLN
INTRAMUSCULAR | Status: DC | PRN
  Administered 2013-10-17: 4 mg via INTRAVENOUS

## 2013-10-17 MED ORDER — MORPHINE SULFATE 2 MG/ML IJ SOLN: 1.0000 mg | INTRAMUSCULAR | Status: DC | PRN

## 2013-10-17 MED ORDER — BUPIVACAINE HCL (PF) 0.25 % IJ SOLN
INTRAMUSCULAR | Status: AC
  Filled 2013-10-17: qty 30

## 2013-10-17 MED ORDER — MIDAZOLAM HCL 2 MG/2ML IJ SOLN
2.0000 mg | INTRAMUSCULAR | Status: AC | PRN
  Administered 2013-10-17: 1 mg via INTRAVENOUS

## 2013-10-17 MED ORDER — HYDROGEN PEROXIDE 3 % EX SOLN
CUTANEOUS | Status: DC | PRN
  Administered 2013-10-17: 1

## 2013-10-17 MED ORDER — MOMETASONE FURO-FORMOTEROL FUM 100-5 MCG/ACT IN AERO: 2.0000 | INHALATION_SPRAY | Freq: Two times a day (BID) | RESPIRATORY_TRACT | Status: DC

## 2013-10-17 SURGICAL SUPPLY — 39 items
APL SKNCLS STERI-STRIP NONHPOA (GAUZE/BANDAGES/DRESSINGS)
BENZOIN TINCTURE PRP APPL 2/3 (GAUZE/BANDAGES/DRESSINGS) ×1 IMPLANT
BLADE HEX COATED 2.75 (ELECTRODE) ×1 IMPLANT
BLADE SURG 15 STRL LF DISP TIS (BLADE) IMPLANT
BLADE SURG 15 STRL SS (BLADE)
BLADE SURG SZ10 CARB STEEL (BLADE) ×2 IMPLANT
BNDG GAUZE ELAST 4 BULKY (GAUZE/BANDAGES/DRESSINGS) ×2 IMPLANT
CANISTER SUCTION 2500CC (MISCELLANEOUS) ×1 IMPLANT
CLOSURE WOUND 1/2 X4 (GAUZE/BANDAGES/DRESSINGS)
DECANTER SPIKE VIAL GLASS SM (MISCELLANEOUS) ×1 IMPLANT
DRAIN PENROSE 18X1/2 LTX STRL (DRAIN) ×1 IMPLANT
DRAPE LAPAROSCOPIC ABDOMINAL (DRAPES) ×2 IMPLANT
DRAPE LAPAROTOMY TRNSV 102X78 (DRAPE) ×1 IMPLANT
ELECT REM PT RETURN 9FT ADLT (ELECTROSURGICAL) ×3
ELECTRODE REM PT RTRN 9FT ADLT (ELECTROSURGICAL) ×1 IMPLANT
GAUZE SPONGE 4X4 12PLY STRL (GAUZE/BANDAGES/DRESSINGS) ×1 IMPLANT
GAUZE SPONGE 4X4 16PLY XRAY LF (GAUZE/BANDAGES/DRESSINGS) ×2 IMPLANT
GLOVE BIOGEL PI IND STRL 7.0 (GLOVE) ×1 IMPLANT
GLOVE BIOGEL PI INDICATOR 7.0 (GLOVE) ×2
GLOVE SURG SIGNA 7.5 PF LTX (GLOVE) ×3 IMPLANT
GOWN SPEC L4 XLG W/TWL (GOWN DISPOSABLE) ×3 IMPLANT
GOWN STRL REUS W/ TWL XL LVL3 (GOWN DISPOSABLE) ×3 IMPLANT
GOWN STRL REUS W/TWL LRG LVL3 (GOWN DISPOSABLE) ×1 IMPLANT
GOWN STRL REUS W/TWL XL LVL3 (GOWN DISPOSABLE) ×6
KIT BASIN OR (CUSTOM PROCEDURE TRAY) ×3 IMPLANT
NDL HYPO 25X1 1.5 SAFETY (NEEDLE) ×1 IMPLANT
NEEDLE HYPO 25X1 1.5 SAFETY (NEEDLE) IMPLANT
NS IRRIG 1000ML POUR BTL (IV SOLUTION) ×3 IMPLANT
PACK BASIC VI WITH GOWN DISP (CUSTOM PROCEDURE TRAY) ×1 IMPLANT
PACK GENERAL/GYN (CUSTOM PROCEDURE TRAY) ×2 IMPLANT
PAD ABD 8X10 STRL (GAUZE/BANDAGES/DRESSINGS) ×2 IMPLANT
PENCIL BUTTON HOLSTER BLD 10FT (ELECTRODE) ×1 IMPLANT
SPONGE LAP 4X18 X RAY DECT (DISPOSABLE) ×2 IMPLANT
STRIP CLOSURE SKIN 1/2X4 (GAUZE/BANDAGES/DRESSINGS) ×1 IMPLANT
SYR BULB IRRIGATION 50ML (SYRINGE) ×1 IMPLANT
SYR CONTROL 10ML LL (SYRINGE) ×1 IMPLANT
TAPE CLOTH SURG 4X10 WHT LF (GAUZE/BANDAGES/DRESSINGS) ×2 IMPLANT
TOWEL OR 17X26 10 PK STRL BLUE (TOWEL DISPOSABLE) ×3 IMPLANT
YANKAUER SUCT BULB TIP 10FT TU (MISCELLANEOUS) ×1 IMPLANT

## 2013-10-17 NOTE — Anesthesia Postprocedure Evaluation (Signed)
  Anesthesia Post-op Note  Patient: Wendy [name]  Procedure(s) Performed: Procedure(s) (LRB): INCISION AND DRAINAGE RIGHT SUBCOSTAL WOUND (Right)  Patient Location: PACU  Anesthesia Type: General  Level of Consciousness: awake and alert   Airway and Oxygen Therapy: Patient Spontanous Breathing  Post-op Pain: mild  Post-op Assessment: Post-op Vital signs reviewed, Patient's Cardiovascular Status Stable, Respiratory Function Stable, Patent Airway and No signs of Nausea or vomiting  Last Vitals:  Filed Vitals:   10/17/13 1900  BP:   Pulse:   Temp: 36.4 C  Resp:     Post-op Vital Signs: stable   Complications: No apparent anesthesia complications

## 2013-10-17 NOTE — Anesthesia Procedure Notes (Signed)
Procedure Name: Intubation Date/Time: 10/17/2013 5:44 PM Performed by: Ofilia Neas Pre-anesthesia Checklist: Emergency Drugs available, Patient identified, Timeout performed, Patient being monitored and Suction available Patient Re-evaluated:Patient Re-evaluated prior to inductionOxygen Delivery Method: Circle system utilized Preoxygenation: Pre-oxygenation with 100% oxygen Intubation Type: IV induction and Cricoid Pressure applied Ventilation: Mask ventilation without difficulty Laryngoscope Size: Miller and 3 Grade View: Grade I Tube type: Oral Tube size: 7.5 mm Number of attempts: 2 (first esophageal by crna mac 4 small mouth, limited opening) Airway Equipment and Method: Stylet Placement Confirmation: ETT inserted through vocal cords under direct vision,  positive ETCO2 and breath sounds checked- equal and bilateral (second DL MDA miller 2 grade 1 with miller unable to see cords with mac  mask ventilated between attempts vss) Secured at: 21 cm Tube secured with: Tape Dental Injury: Teeth and Oropharynx as per pre-operative assessment  Difficulty Due To: Difficulty was anticipated, Difficult Airway- due to limited oral opening, Difficult Airway- due to reduced neck mobility and Difficult Airway- due to anterior larynx Future Recommendations: Recommend- induction with short-acting agent, and alternative techniques readily available

## 2013-10-17 NOTE — Telephone Encounter (Signed)
Pt's sister calling in b/c pt is not doing well at all today. Pt is running fevers of 101, foul drainage coming from the open gallbladder incision, redness, not eating, n/v, and not taking any of her med's. The pt has been feeling this way for a bout a week with fevers and n/v. The pt stated that a bunch of pus shout out of her open gallbadder incision today. The pt is afraid the wound is about to open up all the way by itself. The pt's sister tried taking to an urgent care today but they would not see her they told her to go to the ER. The pt's sister is calling wanting to know what to do with pt. I advised her that I would go speak with Dr Georgette Dover since he is urgent office doctor and Dr [name] is oot. Per Dr Georgette Dover the pt needs to go to [facility] ER directly and I will page the PA on call at [facility] to be looking out for pt when she arrives. I will call the [facility] ER to give them a heads up that the pt is on her way to page the PA when pt arrives. The sister will take her now to [facility].

## 2013-10-17 NOTE — Transfer of Care (Signed)
Immediate Anesthesia Transfer of Care Note  Patient: Wendy [name]  Procedure(s) Performed: Procedure(s): INCISION AND DRAINAGE RIGHT SUBCOSTAL WOUND (Right)  Patient Location: PACU  Anesthesia Type:General  Level of Consciousness: awake, oriented, patient cooperative, lethargic and responds to stimulation  Airway & Oxygen Therapy: Patient Spontanous Breathing and Patient connected to face mask oxygen  Post-op Assessment: Report given to PACU RN, Post -op Vital signs reviewed and stable and Patient moving all extremities  Post vital signs: Reviewed and stable  Complications: No apparent anesthesia complications

## 2013-10-17 NOTE — ED Notes (Signed)
OR staff/RN transferred pt to OR.

## 2013-10-17 NOTE — ED Notes (Signed)
Pt had gallbladder surgery 7/13.  States that she started noticing signs of infection (pus, opening back up) 2 wks ago.  Pt having lots of purulent drainage to site.

## 2013-10-17 NOTE — ED Notes (Signed)
Pt reports approximated surgical incision that opened last night. Upon assessment 13 cm incision has 2 cm opening with yellow purulent drainage. Pt reports fever up to 101.3 with nausea and vomiting. Pt denies diarrhea. Pt reports last tylenol taken yesterday.

## 2013-10-17 NOTE — H&P (Addendum)
Wendy [name] [birth year]  [id number].   Primary Care MD: Delman Kitten, PA Chief Complaint/Reason for Consult: Right upper quadrant wound infection HPI: This is a 63 year old white female who underwent an open cholecystectomy by Dr. Nedra Hai on 09/15/2013. The patient has been struggling at home with nausea and vomiting on and off since her discharge. She has not been feeling well and has been fatigued. She saw Dr. [name] in the office on August 4. Her staples were discontinued at that time. He ordered some labs however these are not visible and Epic and it's unclear whether they have actually been drawn or not. The patient's sister called our office today stating she has been nursing this wound along for the last week. She states her right upper quadrant incision "busted" last night with prelude drainage. She also states her old JP drain site opened up and began draining purulent drainage as well. She had a fever overnight of 101.3. Our office referred her to [facility] emergency department today for further evaluation. She denies intra-abdominal pain, just pain around her skin level. We have been asked to evaluate the patient for further evaluation and admission.  ROS: Please see history of present illness, all other systems have been reviewed and are negative  Family History  Problem Relation Age of Onset  . Lung cancer Mother     Past Medical History  Diagnosis Date  . Glucose intolerance (impaired glucose tolerance)   . Hypertension   . PAD (peripheral artery disease)   . COPD (chronic obstructive pulmonary disease)   . Peripheral neuropathy   . Chronic back pain   . Chronic leg pain   . CAD (coronary artery disease), native coronary artery     Hx CABG 1998, last PCI 2013; LHC 07/2013:  EF 55-60%, L-LAD prob atretic, no sig disease in LAD, S-OM1 ok with patent stent, S-dRCA ok => med Rx.  Marland Kitchen HLD (hyperlipidemia)   . Myocardial infarction   . Sleep apnea   . Shortness of breath    when walking  . Anxiety   . GERD (gastroesophageal reflux disease)     Past Surgical History  Procedure Laterality Date  . Cardiac surgery  1998    LTA-LAD, SVG-OM, SVG-RCA  . Coronary angioplasty with stent placement  11/2011, 02/2012    DES to SVG-RCA both times, In [place], [place], Dr. Bruce Donath  . Cervical spine surgery  1991 and again several years later  . Knee arthroscopy Right 1998  . Finger surgery Right     ring finger  . Orif humerus fracture Left 08/27/2013    Procedure: OPEN REDUCTION INTERNAL FIXATION (ORIF) LEFT HUMERUS ;  Surgeon: Rozanna Box, MD;  Location: [place];  Service: Orthopedics;  Laterality: Left;  . Cholecystectomy  09/17/2013    dr [name]  . Cholecystectomy N/A 09/16/2013    Procedure: LAPAROSCOPIC CHOLECYSTECTOMY CONVERTED TO OPEN CHOLECYSTECTOMY WITH CHOLANGIOGRAM;  Surgeon: [name], MD;  Location: [place];  Service: General;  Laterality: N/A;  . Ercp N/A 09/19/2013    Procedure: ENDOSCOPIC RETROGRADE CHOLANGIOPANCREATOGRAPHY (ERCP);  Surgeon: [name], MD;  Location: [place];  Service: Endoscopy;  Laterality: N/A;    Social History:  reports that she quit smoking about 15 years ago. She has never used smokeless tobacco. She reports that she does not drink alcohol or use illicit drugs.  Allergies:  Allergies  Allergen Reactions  . Benadryl [Diphenhydramine] Shortness Of Breath  . Adhesive [Tape]     Burning of skin  .  Amoxicillin Nausea And Vomiting  . Flexeril [Cyclobenzaprine] Other (See Comments)    States that it messes with her blood circulation; pt states that she has to walk around     (Not in a hospital admission)  Blood pressure 112/73, pulse 94, temperature 98.5 F (36.9 C), temperature source Oral, resp. rate 18, SpO2 100.00%. Physical Exam: General: pleasant, obese white female who is laying in bed in NAD HEENT: head is normocephalic, atraumatic.  Sclera are noninjected.  PERRL.  Ears and nose without any masses  or lesions.  Mouth is pink and moist Heart: regular, rate, and rhythm.  Normal s1,s2. No obvious murmurs, gallops, or rubs noted.  Palpable radial and pedal pulses bilaterally Lungs: CTAB, no wheezes, rhonchi, or rales noted.  Respiratory effort nonlabored Abd: soft, tender in the right upper quadrant. She has a small 1.5 x 1 cm open area of her otherwise well-healed incision. There is significant fluctuance along the lateral portion of her wound. When pressing on this purulent drainage is expressed out of the more medial opening. Her old JP drain site is also open draining milky purulent drainage.  There is erythema around her right upper quadrant incision., ND, +BS, no masses, hernias, or organomegaly MS: all 4 extremities are symmetrical with no cyanosis, clubbing, or edema. Skin: warm and dry with no masses, lesions, or rashes Psych: A&Ox3 with an appropriate affect.    Results for orders placed during the hospital encounter of 10/17/13 (from the past 48 hour(s))  CBC WITH DIFFERENTIAL     Status: Abnormal   Collection Time    10/17/13  3:27 PM      Result Value Ref Range   WBC 10.1  4.0 - 10.5 K/uL   RBC 4.14  3.87 - 5.11 MIL/uL   Hemoglobin 11.0 (*) 12.0 - 15.0 g/dL   HCT 35.5 (*) 36.0 - 46.0 %   MCV 85.7  78.0 - 100.0 fL   MCH 26.6  26.0 - 34.0 pg   MCHC 31.0  30.0 - 36.0 g/dL   RDW 15.2  11.5 - 15.5 %   Platelets 296  150 - 400 K/uL   Neutrophils Relative % 84 (*) 43 - 77 %   Neutro Abs 8.3 (*) 1.7 - 7.7 K/uL   Lymphocytes Relative 12  12 - 46 %   Lymphs Abs 1.3  0.7 - 4.0 K/uL   Monocytes Relative 4  3 - 12 %   Monocytes Absolute 0.4  0.1 - 1.0 K/uL   Eosinophils Relative 0  0 - 5 %   Eosinophils Absolute 0.0  0.0 - 0.7 K/uL   Basophils Relative 0  0 - 1 %   Basophils Absolute 0.0  0.0 - 0.1 K/uL  COMPREHENSIVE METABOLIC PANEL     Status: Abnormal   Collection Time    10/17/13  3:27 PM      Result Value Ref Range   Sodium 135 (*) 137 - 147 mEq/L   Potassium 3.7  3.7 -  5.3 mEq/L   Chloride 90 (*) 96 - 112 mEq/L   CO2 26  19 - 32 mEq/L   Glucose, Bld 105 (*) 70 - 99 mg/dL   BUN 22  6 - 23 mg/dL   Creatinine, Ser 0.90  0.50 - 1.10 mg/dL   Calcium 10.2  8.4 - 10.5 mg/dL   Total Protein 8.4 (*) 6.0 - 8.3 g/dL   Albumin 2.7 (*) 3.5 - 5.2 g/dL   AST 25  0 - 37  U/L   ALT 14  0 - 35 U/L   Alkaline Phosphatase 102  39 - 117 U/L   Total Bilirubin 0.4  0.3 - 1.2 mg/dL   GFR calc non Af Amer 67 (*) >90 mL/min   GFR calc Af Amer 78 (*) >90 mL/min   Comment: (NOTE)     The eGFR has been calculated using the CKD EPI equation.     This calculation has not been validated in all clinical situations.     eGFR's persistently <90 mL/min signify possible Chronic Kidney     Disease.   Anion gap 19 (*) 5 - 15   No results found.  Assessment/Plan 1. Status post laparoscopic converted to open cholecystectomy 2. Postoperative wound infection - right subcostal  Patient Active Problem List   Diagnosis Date Noted  . Wound infection after surgery 10/17/2013  . Acute respiratory failure 09/20/2013  . Nonspecific abnormal results of liver function study 09/17/2013  . Cholecystitis, acute 09/15/2013  . PAD (peripheral artery disease)   . COPD (chronic obstructive pulmonary disease)   . Peripheral neuropathy   . Chronic back pain   . Sleep apnea   . Anxiety   . GERD (gastroesophageal reflux disease)   . 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 Hypertension 07/20/2013  . HLD (hyperlipidemia) 07/20/2013  . Right groin wound 07/20/2013  . Unstable angina 07/19/2013  . CAD (coronary artery disease) 07/19/2013   Plan: 1. We will admit the patient to a telemetry floor. We will initially bring her to the operating room to do an incision and drainage of her abdominal wall abscess. Once this is complete we will obtain a CT scan of her abdomen and pelvis to rule out any further intra-abdominal process. She'll be placed on IV Zosyn.  She has amoxicillin listed as an allergy; however,  her reaction to this is nausea and diarrhea. Since this is not a true allergy we will try Zosyn. She will be n.p.o. All of her home medications for her COPD as well as her hypertension has been continued.  She admits to taking Plavix at home however this was not on her medication reconciliation form. This will clearly be held for the meantime.  OSBORNE,KELLY E 10/17/2013, 4:42 PM Pager: [phone]  Agree with above. I think this is all a superficial wound infection.  Will plan to do CT scan to make sure that there is no intra-abdominal component. I spoke to sister, Tally Due [phone]).  She is divorced.  She has a son, but is not close to him.  Alphonsa Overall, MD, [facility] Pager: [phone] Office phone:  [phone]

## 2013-10-17 NOTE — Anesthesia Preprocedure Evaluation (Addendum)
Anesthesia Evaluation  Patient identified by MRN, date of birth, ID band Patient awake    Reviewed: Allergy & Precautions, H&P , NPO status , Patient's Chart, lab work & pertinent test results, reviewed documented beta blocker date and time   Airway Mallampati: II TM Distance: >3 FB Neck ROM: Full    Dental  (+) Teeth Intact, Dental Advisory Given   Pulmonary sleep apnea , COPD COPD inhaler, former smoker,  breath sounds clear to auscultation  + decreased breath sounds      Cardiovascular hypertension, Pt. on medications and Pt. on home beta blockers + angina + CAD, + Past MI and + Peripheral Vascular Disease Rhythm:Regular Rate:Normal  Hx CABG 1998, last PCI 2013; LHC 07/2013:  EF 55-60%, L-LAD prob atretic, no sig disease in LAD, S-OM1 ok with patent stent, S-dRCA ok => med Rx.   Neuro/Psych negative psych ROS   GI/Hepatic Neg liver ROS, GERD-  Medicated,  Endo/Other  negative endocrine ROS  Renal/GU negative Renal ROS  negative genitourinary   Musculoskeletal negative musculoskeletal ROS (+)   Abdominal (+) + obese,   Peds  Hematology   Anesthesia Other Findings   Reproductive/Obstetrics negative OB ROS                         Anesthesia Physical  Anesthesia Plan  ASA: III  Anesthesia Plan: General   Post-op Pain Management:    Induction: Intravenous  Airway Management Planned: LMA and Oral ETT  Additional Equipment:   Intra-op Plan:   Post-operative Plan: Extubation in OR  Informed Consent: I have reviewed the patients History and Physical, chart, labs and discussed the procedure including the risks, benefits and alternatives for the proposed anesthesia with the patient or authorized representative who has indicated his/her understanding and acceptance.   Dental advisory given  Plan Discussed with: Surgeon and CRNA  Anesthesia Plan Comments:        Anesthesia Quick  Evaluation

## 2013-10-17 NOTE — ED Provider Notes (Signed)
CSN: [id number]     Arrival date & time 10/17/13  1350 History   First MD Initiated Contact with Patient 10/17/13 1455     Chief Complaint  Patient presents with  . Wound Infection     (Consider location/radiation/quality/duration/timing/severity/associated sxs/prior Treatment) HPI Pt reports she had an open cholecystectomy done on 7/13. She reports she has been having intermittent nausea and vomiting since that time. She reports she pulled out the drain last week. She states yesterday the wound started getting more red and there was an area that got more swollen. Last night the area opened up and has been draining copious amounts of purulent material. She has had fever with chills and states her fevers been up to 101.3. She also has been having more consistent nausea and vomiting since last week. She denies any diarrhea. She states any change of positions makes her pain worse.  PCP Dr Theodoro Grist  Past Medical History  Diagnosis Date  . Glucose intolerance (impaired glucose tolerance)   . Hypertension   . PAD (peripheral artery disease)   . COPD (chronic obstructive pulmonary disease)   . Peripheral neuropathy   . Chronic back pain   . Chronic leg pain   . CAD (coronary artery disease), native coronary artery     Hx CABG 1998, last PCI 2013; LHC 07/2013:  EF 55-60%, L-LAD prob atretic, no sig disease in LAD, S-OM1 ok with patent stent, S-dRCA ok => med Rx.  Marland Kitchen HLD (hyperlipidemia)   . Myocardial infarction   . Sleep apnea   . Shortness of breath     when walking  . Anxiety   . GERD (gastroesophageal reflux disease)    Past Surgical History  Procedure Laterality Date  . Cardiac surgery  1998    LTA-LAD, SVG-OM, SVG-RCA  . Coronary angioplasty with stent placement  11/2011, 02/2012    DES to SVG-RCA both times, In [place], [place], Dr. Bruce Donath  . Cervical spine surgery  1991 and again several years later  . Knee arthroscopy Right 1998  . Finger surgery Right     ring finger  . Orif  humerus fracture Left 08/27/2013    Procedure: OPEN REDUCTION INTERNAL FIXATION (ORIF) LEFT HUMERUS ;  Surgeon: Rozanna Box, MD;  Location: [place];  Service: Orthopedics;  Laterality: Left;  . Cholecystectomy  09/17/2013    dr [name]  . Cholecystectomy N/A 09/16/2013    Procedure: LAPAROSCOPIC CHOLECYSTECTOMY CONVERTED TO OPEN CHOLECYSTECTOMY WITH CHOLANGIOGRAM;  Surgeon: [name], MD;  Location: [place];  Service: General;  Laterality: N/A;  . Ercp N/A 09/19/2013    Procedure: ENDOSCOPIC RETROGRADE CHOLANGIOPANCREATOGRAPHY (ERCP);  Surgeon: [name], MD;  Location: [place];  Service: Endoscopy;  Laterality: N/A;   Family History  Problem Relation Age of Onset  . Lung cancer Mother    History  Substance Use Topics  . Smoking status: Former Smoker    Quit date: 07/20/1998  . Smokeless tobacco: Never Used  . Alcohol Use: No      OB History   Grav Para Term Preterm Abortions TAB SAB Ect Mult Living                 Review of Systems  All other systems reviewed and are negative.     Allergies  Benadryl; Adhesive; Amoxicillin; and Flexeril  Home Medications   Prior to Admission medications   Medication Sig Start Date End Date Taking? Authorizing Provider  Aclidinium Bromide (TUDORZA PRESSAIR) 400 MCG/ACT AEPB Inhale  1 puff into the lungs daily.   Yes Historical Provider, MD  albuterol (PROVENTIL HFA;VENTOLIN HFA) 108 (90 BASE) MCG/ACT inhaler Inhale 2 puffs into the lungs every 6 (six) hours as needed for wheezing or shortness of breath.   Yes Historical Provider, MD  ALPRAZolam Duanne Moron) 0.5 MG tablet Take 1 tablet (0.5 mg total) by mouth 3 (three) times daily as needed for anxiety. 09/21/13  Yes Ripudeep Krystal Eaton, MD  aspirin EC 81 MG tablet Take 81 mg by mouth daily.   Yes Historical Provider, MD  atorvastatin (LIPITOR) 10 MG tablet Take 1 tablet (10 mg total) by mouth at bedtime. 09/21/13  Yes Ripudeep Krystal Eaton, MD  beclomethasone (QVAR) 80 MCG/ACT inhaler  Inhale 2 puffs into the lungs 2 (two) times daily.   Yes Historical Provider, MD  cetirizine (ZYRTEC) 10 MG tablet Take 10 mg by mouth daily.   Yes Historical Provider, MD  Cyanocobalamin 1000 MCG/ML KIT Inject 1,000 mcg as directed every 30 (thirty) days. 15th of each month   Yes Historical Provider, MD  docusate sodium 100 MG CAPS Take 100 mg by mouth 2 (two) times daily. 08/28/13  Yes Jari Pigg, PA-C  ezetimibe (ZETIA) 10 MG tablet Take 10 mg by mouth daily.   Yes Historical Provider, MD  Fluticasone-Salmeterol (ADVAIR) 250-50 MCG/DOSE AEPB Inhale 1 puff into the lungs 2 (two) times daily.   Yes Historical Provider, MD  metoprolol (LOPRESSOR) 100 MG tablet Take 1 tablet (100 mg total) by mouth 2 (two) times daily. 09/22/13  Yes Ripudeep Krystal Eaton, MD  mometasone-formoterol (DULERA) 100-5 MCG/ACT AERO Inhale 2 puffs into the lungs 2 (two) times daily as needed for wheezing.   Yes Historical Provider, MD  nitroGLYCERIN (NITROSTAT) 0.4 MG SL tablet Place 0.4 mg under the tongue every 5 (five) minutes as needed for chest pain.   Yes Historical Provider, MD  omeprazole (PRILOSEC) 40 MG capsule Take 40 mg by mouth 2 (two) times daily.   Yes Historical Provider, MD  ondansetron (ZOFRAN ODT) 4 MG disintegrating tablet Take 1 tablet (4 mg total) by mouth every 8 (eight) hours as needed for nausea or vomiting. 09/21/13  Yes Ripudeep Krystal Eaton, MD  oxyCODONE-acetaminophen (PERCOCET/ROXICET) 5-325 MG per tablet Take 1-2 tablets by mouth every 6 (six) hours as needed for severe pain. 10/08/13  Yes [name], MD   BP 112/76  Pulse 100  Temp(Src) 98.7 F (37.1 C) (Oral)  Resp 22  SpO2 99%  Vital signs normal   Physical Exam  Nursing note and vitals reviewed. Constitutional: She is oriented to person, place, and time. She appears well-developed and well-nourished.  Non-toxic appearance. She does not appear ill. No distress.  HENT:  Head: Normocephalic and atraumatic.  Right Ear: External ear normal.    Left Ear: External ear normal.  Nose: Nose normal. No mucosal edema or rhinorrhea.  Mouth/Throat: Mucous membranes are normal. No dental abscesses or uvula swelling.  Eyes: Conjunctivae and EOM are normal. Pupils are equal, round, and reactive to light.  Neck: Normal range of motion and full passive range of motion without pain. Neck supple.  Cardiovascular: Exam reveals no gallop and no friction rub.   No murmur heard. Pulmonary/Chest: Effort normal. No respiratory distress. She has no rhonchi. She exhibits no crepitus.  Abdominal: Soft. Normal appearance and bowel sounds are normal. She exhibits no distension. There is tenderness. There is no rebound and no guarding.  Large RUQ incision surrounded by redness with 2 cm  dehiscence  in the medical aspect with purulent drainage. She indicates the tube was in the lateral aspect of the surgical incision which she removed last week. See Photo   Musculoskeletal: Normal range of motion. She exhibits no edema and no tenderness.  Moves all extremities well.   Neurological: She is alert and oriented to person, place, and time. She has normal strength. No cranial nerve deficit.  Skin: Skin is warm, dry and intact. No rash noted. No erythema. No pallor.  Psychiatric: She has a normal mood and affect. Her speech is normal and behavior is normal. Her mood appears not anxious.       ED Course  Procedures (including critical care time)  Medications  0.9 %  sodium chloride infusion ( Intravenous New Bag/Given 10/17/13 1532)  sodium chloride 0.9 % bolus 1,000 mL (not administered)    15:16 Claiborne Billings, PA with [facility] here to see patient.   15:30 Claiborne Billings states she is going to get patient admitted.   17:00 CT scan ordered as discussed with Claiborne Billings after her creatinine came back normal.   Labs Review Results for orders placed during the hospital encounter of 10/17/13  CBC WITH DIFFERENTIAL      Result Value Ref Range   WBC 10.1  4.0 - 10.5 K/uL   RBC 4.14   3.87 - 5.11 MIL/uL   Hemoglobin 11.0 (*) 12.0 - 15.0 g/dL   HCT 35.5 (*) 36.0 - 46.0 %   MCV 85.7  78.0 - 100.0 fL   MCH 26.6  26.0 - 34.0 pg   MCHC 31.0  30.0 - 36.0 g/dL   RDW 15.2  11.5 - 15.5 %   Platelets 296  150 - 400 K/uL   Neutrophils Relative % 84 (*) 43 - 77 %   Neutro Abs 8.3 (*) 1.7 - 7.7 K/uL   Lymphocytes Relative 12  12 - 46 %   Lymphs Abs 1.3  0.7 - 4.0 K/uL   Monocytes Relative 4  3 - 12 %   Monocytes Absolute 0.4  0.1 - 1.0 K/uL   Eosinophils Relative 0  0 - 5 %   Eosinophils Absolute 0.0  0.0 - 0.7 K/uL   Basophils Relative 0  0 - 1 %   Basophils Absolute 0.0  0.0 - 0.1 K/uL  COMPREHENSIVE METABOLIC PANEL      Result Value Ref Range   Sodium 135 (*) 137 - 147 mEq/L   Potassium 3.7  3.7 - 5.3 mEq/L   Chloride 90 (*) 96 - 112 mEq/L   CO2 26  19 - 32 mEq/L   Glucose, Bld 105 (*) 70 - 99 mg/dL   BUN 22  6 - 23 mg/dL   Creatinine, Ser 0.90  0.50 - 1.10 mg/dL   Calcium 10.2  8.4 - 10.5 mg/dL   Total Protein 8.4 (*) 6.0 - 8.3 g/dL   Albumin 2.7 (*) 3.5 - 5.2 g/dL   AST 25  0 - 37 U/L   ALT 14  0 - 35 U/L   Alkaline Phosphatase 102  39 - 117 U/L   Total Bilirubin 0.4  0.3 - 1.2 mg/dL   GFR calc non Af Amer 67 (*) >90 mL/min   GFR calc Af Amer 78 (*) >90 mL/min   Anion gap 19 (*) 5 - 15   Dg Ercp Biliary & Pancreatic Ducts  09/19/2013   CLINICAL DATA:  Possible common bile duct stone  EXAM: ERCP  TECHNIQUE: Multiple spot images obtained with the fluoroscopic device  and submitted for interpretation post-procedure.  COMPARISON:  09/16/2013  FINDINGS: Multiple spot films were obtained during in the ERCP. Transient filling defects are noted consistent with injected air bubbles. No definitive stone is identified. Correlation with the findings during the procedure is recommended.  IMPRESSION: These images were submitted for radiologic interpretation only. Please see the procedural report for the amount of contrast and the fluoroscopy time utilized.   Electronically Signed    By: Inez Catalina M.D.   On: 09/19/2013 15:30      Imaging Review No results found.   EKG Interpretation None      MDM   Final diagnoses:  Wound disruption, post-op, skin, initial encounter  Post op infection    Plan admission  Rolland Porter, MD, Alanson Aly, MD 10/17/13 [phone]

## 2013-10-18 ENCOUNTER — Encounter (HOSPITAL_COMMUNITY): Payer: Self-pay | Admitting: Surgery

## 2013-10-18 LAB — BASIC METABOLIC PANEL
Anion gap: 16 — ABNORMAL HIGH (ref 5–15)
BUN: 22 mg/dL (ref 6–23)
CO2: 25 mEq/L (ref 19–32)
Calcium: 9.5 mg/dL (ref 8.4–10.5)
Chloride: 93 mEq/L — ABNORMAL LOW (ref 96–112)
Creatinine, Ser: 0.95 mg/dL (ref 0.50–1.10)
GFR calc Af Amer: 73 mL/min — ABNORMAL LOW (ref >90)
GFR calc non Af Amer: 63 mL/min — ABNORMAL LOW (ref >90)
Glucose, Bld: 179 mg/dL — ABNORMAL HIGH (ref 70–99)
Potassium: 3.9 mEq/L (ref 3.7–5.3)
Sodium: 134 mEq/L — ABNORMAL LOW (ref 137–147)

## 2013-10-18 LAB — CBC WITH DIFFERENTIAL/PLATELET
Basophils Absolute: 0 10*3/uL (ref 0.0–0.1)
Basophils Relative: 0 % (ref 0–1)
Eosinophils Absolute: 0 10*3/uL (ref 0.0–0.7)
Eosinophils Relative: 0 % (ref 0–5)
HCT: 33.5 % — ABNORMAL LOW (ref 36.0–46.0)
Hemoglobin: 10.4 g/dL — ABNORMAL LOW (ref 12.0–15.0)
Lymphocytes Relative: 10 % — ABNORMAL LOW (ref 12–46)
Lymphs Abs: 0.5 10*3/uL — ABNORMAL LOW (ref 0.7–4.0)
MCH: 26.8 pg (ref 26.0–34.0)
MCHC: 31 g/dL (ref 30.0–36.0)
MCV: 86.3 fL (ref 78.0–100.0)
Monocytes Absolute: 0.1 10*3/uL (ref 0.1–1.0)
Monocytes Relative: 1 % — ABNORMAL LOW (ref 3–12)
Neutro Abs: 5 10*3/uL (ref 1.7–7.7)
Neutrophils Relative %: 89 % — ABNORMAL HIGH (ref 43–77)
Platelets: 269 10*3/uL (ref 150–400)
RBC: 3.88 MIL/uL (ref 3.87–5.11)
RDW: 15 % (ref 11.5–15.5)
WBC: 5.6 10*3/uL (ref 4.0–10.5)

## 2013-10-18 MED ORDER — SACCHAROMYCES BOULARDII 250 MG PO CAPS
250.0000 mg | ORAL_CAPSULE | Freq: Two times a day (BID) | ORAL | Status: DC
  Administered 2013-10-18 – 2013-10-22 (×8): 250 mg via ORAL
  Filled 2013-10-18 (×10): qty 1

## 2013-10-18 MED ORDER — OXYCODONE-ACETAMINOPHEN 5-325 MG PO TABS
1.0000 | ORAL_TABLET | ORAL | Status: DC | PRN
  Administered 2013-10-18: 1 via ORAL
  Administered 2013-10-18 – 2013-10-22 (×10): 2 via ORAL
  Filled 2013-10-18: qty 2
  Filled 2013-10-18 (×2): qty 1
  Filled 2013-10-18 (×7): qty 2
  Filled 2013-10-18: qty 1
  Filled 2013-10-18 (×2): qty 2

## 2013-10-18 MED ORDER — METHOCARBAMOL 1000 MG/10ML IJ SOLN
500.0000 mg | Freq: Four times a day (QID) | INTRAVENOUS | Status: DC | PRN
  Filled 2013-10-18: qty 10

## 2013-10-18 MED ORDER — HYDROCODONE-ACETAMINOPHEN 5-325 MG PO TABS: 1.0000 | ORAL_TABLET | ORAL | Status: DC | PRN

## 2013-10-18 MED ORDER — METOPROLOL TARTRATE 100 MG PO TABS
100.0000 mg | ORAL_TABLET | Freq: Two times a day (BID) | ORAL | Status: DC
  Administered 2013-10-18 – 2013-10-19 (×2): 100 mg via ORAL
  Filled 2013-10-18 (×4): qty 1

## 2013-10-18 MED ORDER — CETYLPYRIDINIUM CHLORIDE 0.05 % MT LIQD
7.0000 mL | Freq: Two times a day (BID) | OROMUCOSAL | Status: DC
  Administered 2013-10-18 – 2013-10-21 (×7): 7 mL via OROMUCOSAL

## 2013-10-18 MED ORDER — CLOPIDOGREL BISULFATE 75 MG PO TABS
75.0000 mg | ORAL_TABLET | Freq: Every day | ORAL | Status: DC
  Administered 2013-10-18 – 2013-10-22 (×5): 75 mg via ORAL
  Filled 2013-10-18 (×5): qty 1

## 2013-10-18 MED ORDER — BOOST / RESOURCE BREEZE PO LIQD
1.0000 | Freq: Two times a day (BID) | ORAL | Status: DC
  Administered 2013-10-19 – 2013-10-22 (×5): 1 via ORAL

## 2013-10-18 MED ORDER — CHLORHEXIDINE GLUCONATE 0.12 % MT SOLN
15.0000 mL | Freq: Two times a day (BID) | OROMUCOSAL | Status: DC
  Administered 2013-10-18 – 2013-10-22 (×6): 15 mL via OROMUCOSAL
  Filled 2013-10-18 (×12): qty 15

## 2013-10-18 NOTE — Progress Notes (Signed)
INITIAL NUTRITION ASSESSMENT  Pt meets criteria for severe MALNUTRITION in the context of chronic illness as evidenced by 5.6% weight loss with <75% estimated energy intake in the past month.  DOCUMENTATION CODES Per approved criteria  -Severe malnutrition in the context of chronic illness -Obesity Unspecified   INTERVENTION: - Diet advancement per MD - Resource Breeze BID - Multivitamin 1 tablet PO daily - Discussed low fat diet therapy for gallbladder removal  - RD to continue to monitor   NUTRITION DIAGNOSIS: Inadequate oral intake related to clear liquid diet as evidenced by diet order.   Goal: Advance diet as tolerated to low fat diet  Monitor:  Weights, labs, diet advancement  Reason for Assessment: Malnutrition screening tool   63 y.o. female  Admitting Dx: Wound infection after surgery  ASSESSMENT: Pt underwent an open cholecystectomy by Dr. Nedra Hai on 09/15/2013. The patient has been struggling at home with nausea and vomiting on and off since her discharge. She has not been feeling well and has been fatigued. She saw Dr. [name] in the office on August 4. Her staples were discontinued at that time. Found to have RUQ wound infection and had incision and drainage yesterday.   - Met with pt who reports on/off nausea and dry heaving since cholecystectomy, worse in the past 2 days with pt vomiting up medications - Said she's been barely eating anything, had a 1/2 cup of canned peas earlier this week and some mashed potatoes with gravy that took her 3 days to eat - Has been drinking 12 ounces of buttermilk on/off and 16 ounces of water daily - Said she's lost 30 pounds recently however weight trend shows weight down 11 pounds in the past month - Said she's tolerated her clear liquid diet this morning with nausea/vomiting - Nutrition focused physical exam performed on upper body as pt wearing SCDs on legs and exam was WNL    Height: Ht Readings from Last 1  Encounters:  10/18/13 5' 2" (1.575 m)    Weight: Wt Readings from Last 1 Encounters:  10/18/13 183 lb 3.2 oz (83.099 kg)    Ideal Body Weight: 110 lbs   % Ideal Body Weight: 166%  Wt Readings from Last 10 Encounters:  10/18/13 183 lb 3.2 oz (83.099 kg)  10/18/13 183 lb 3.2 oz (83.099 kg)  10/08/13 182 lb 6.4 oz (82.736 kg)  09/24/13 198 lb 2 oz (89.869 kg)  09/21/13 194 lb 7.1 oz (88.2 kg)  09/21/13 194 lb 7.1 oz (88.2 kg)  09/21/13 194 lb 7.1 oz (88.2 kg)  09/21/13 194 lb 7.1 oz (88.2 kg)  09/21/13 194 lb 7.1 oz (88.2 kg)  08/28/13 195 lb (88.451 kg)    Usual Body Weight: 213 lbs per pt report  % Usual Body Weight: 86%  BMI:  Body mass index is 33.5 kg/(m^2). Class I obesity   Estimated Nutritional Needs: Kcal: 1400-1600 Protein: 60-80g Fluid: 1.4-1.6L/day   Skin: Abdominal incision   Diet Order: Full Liquid  EDUCATION NEEDS: -Education needs addressed - discussed low fat diet for gallbladder removal    Intake/Output Summary (Last 24 hours) at 10/18/13 1155 Last data filed at 10/18/13 1153  Gross per 24 hour  Intake 2916.67 ml  Output   1750 ml  Net 1166.67 ml    Last BM: PTA  Labs:   Recent Labs Lab 10/17/13 1527 10/18/13 0530  NA 135* 134*  K 3.7 3.9  CL 90* 93*  CO2 26 25  BUN 22 22  CREATININE 0.90 0.95  CALCIUM 10.2 9.5  GLUCOSE 105* 179*    CBG (last 3)  No results found for this basename: GLUCAP,  in the last 72 hours  Scheduled Meds: . antiseptic oral rinse  7 mL Mouth Rinse q12n4p  . aspirin EC  81 mg Oral Daily  . chlorhexidine  15 mL Mouth Rinse BID  . clopidogrel  75 mg Oral Daily  . fluticasone  2 puff Inhalation BID  . metoprolol  100 mg Oral BID  . mometasone-formoterol  2 puff Inhalation BID  . piperacillin-tazobactam (ZOSYN)  IV  3.375 g Intravenous Q8H  . piperacillin-tazobactam  3.375 g Intravenous Once  . saccharomyces boulardii  250 mg Oral BID  . sodium chloride  1,000 mL Intravenous Once  . tiotropium  18  mcg Inhalation Daily    Continuous Infusions: . dextrose 5 % and 0.45 % NaCl with KCl 20 mEq/L 125 mL/hr at 10/18/13 0441    Past Medical History  Diagnosis Date  . Glucose intolerance (impaired glucose tolerance)   . Hypertension   . PAD (peripheral artery disease)   . COPD (chronic obstructive pulmonary disease)   . Peripheral neuropathy   . Chronic back pain   . Chronic leg pain   . CAD (coronary artery disease), native coronary artery     Hx CABG 1998, last PCI 2013; LHC 07/2013:  EF 55-60%, L-LAD prob atretic, no sig disease in LAD, S-OM1 ok with patent stent, S-dRCA ok => med Rx.  Marland Kitchen HLD (hyperlipidemia)   . Myocardial infarction   . Sleep apnea   . Shortness of breath     when walking  . Anxiety   . GERD (gastroesophageal reflux disease)     Past Surgical History  Procedure Laterality Date  . Cardiac surgery  1998    LTA-LAD, SVG-OM, SVG-RCA  . Coronary angioplasty with stent placement  11/2011, 02/2012    DES to SVG-RCA both times, In [place], [place], Dr. Bruce Donath  . Cervical spine surgery  1991 and again several years later  . Knee arthroscopy Right 1998  . Finger surgery Right     ring finger  . Orif humerus fracture Left 08/27/2013    Procedure: OPEN REDUCTION INTERNAL FIXATION (ORIF) LEFT HUMERUS ;  Surgeon: Rozanna Box, MD;  Location: [facility];  Service: Orthopedics;  Laterality: Left;  . Cholecystectomy  09/17/2013    dr [name]  . Cholecystectomy N/A 09/16/2013    Procedure: LAPAROSCOPIC CHOLECYSTECTOMY CONVERTED TO OPEN CHOLECYSTECTOMY WITH CHOLANGIOGRAM;  Surgeon: [name], MD;  Location: [facility];  Service: General;  Laterality: N/A;  . Ercp N/A 09/19/2013    Procedure: ENDOSCOPIC RETROGRADE CHOLANGIOPANCREATOGRAPHY (ERCP);  Surgeon: [name], MD;  Location: [facility];  Service: Endoscopy;  Laterality: N/A;  . I and d right subcostal wound  10/17/13    Carlis Stable MS, RD, LDN [phone] Pager [phone] Weekend/After Hours Pager

## 2013-10-18 NOTE — Progress Notes (Signed)
Patient's temp runs low, taken orally and axillary. Patient's skin is cold to touch. Dr. Johney Maine was made aware. Temp rechecked, 96 degrees rectally. Will continue to monitor.

## 2013-10-18 NOTE — Progress Notes (Signed)
Patient ID: Wendy [name], female   DOB: 1950/12/05, 63 y.o.   MRN: [id number] 1 Day Post-Op  Subjective: Pt feels better today.  Hungry.  No nausea.  Objective: Vital signs in last 24 hours: Temp:  [94.5 F (34.7 C)-98.7 F (37.1 C)] 96 F (35.6 C) (08/14 0653) Pulse Rate:  [52-100] 52 (08/14 0642) Resp:  [11-24] 16 (08/14 0642) BP: (91-112)/(35-76) 97/46 mmHg (08/14 0642) SpO2:  [94 %-100 %] 97 % (08/14 0757) Weight:  [183 lb 3.2 oz (83.099 kg)] 183 lb 3.2 oz (83.099 kg) (08/14 0451) Last BM Date: 10/17/13 (pre-op)  Intake/Output from previous day: 08/13 0701 - 08/14 0700 In: 2916.7 [I.V.:2816.7; IV Piggyback:100] Out: 1200 [Urine:1200] Intake/Output this shift:    PE: Abd: soft, wound is completely clean.  No surrounding cellulitis.  Packing removed and repacked.  Lab Results:   Recent Labs  10/17/13 1527 10/18/13 0530  WBC 10.1 5.6  HGB 11.0* 10.4*  HCT 35.5* 33.5*  PLT 296 269   BMET  Recent Labs  10/17/13 1527 10/18/13 0530  NA 135* 134*  K 3.7 3.9  CL 90* 93*  CO2 26 25  GLUCOSE 105* 179*  BUN 22 22  CREATININE 0.90 0.95  CALCIUM 10.2 9.5   PT/INR No results found for this basename: LABPROT, INR,  in the last 72 hours CMP     Component Value Date/Time   NA 134* 10/18/2013 0530   K 3.9 10/18/2013 0530   CL 93* 10/18/2013 0530   CO2 25 10/18/2013 0530   GLUCOSE 179* 10/18/2013 0530   BUN 22 10/18/2013 0530   CREATININE 0.95 10/18/2013 0530   CALCIUM 9.5 10/18/2013 0530   PROT 8.4* 10/17/2013 1527   ALBUMIN 2.7* 10/17/2013 1527   AST 25 10/17/2013 1527   ALT 14 10/17/2013 1527   ALKPHOS 102 10/17/2013 1527   BILITOT 0.4 10/17/2013 1527   GFRNONAA 63* 10/18/2013 0530   GFRAA 73* 10/18/2013 0530   Lipase     Component Value Date/Time   LIPASE 8* 09/24/2013 1743       Studies/Results: Ct Abdomen Pelvis W Contrast  10/18/2013   CLINICAL DATA:  Status post cholecystectomy, with persistent abdominal wound; status post recent debridement.  EXAM: CT  ABDOMEN AND PELVIS WITH CONTRAST  TECHNIQUE: Multidetector CT imaging of the abdomen and pelvis was performed using the standard protocol following bolus administration of intravenous contrast.  CONTRAST:  121mL OMNIPAQUE IOHEXOL 300 MG/ML  SOLN  COMPARISON:  CT of the abdomen and pelvis performed 09/15/2013  FINDINGS: Minimal bibasilar atelectasis is noted. The patient is status post median sternotomy.  A large soft tissue defect is noted in the right anterior abdominal wall, tracking over the location of the gallbladder fossa, with associated soft tissue inflammation. There is an associated defect in the fascia, with a partial defect in the musculature of the right lateral abdominal wall. Trace fluid is seen tracking along the musculature of the abdominal wall, with a small apparent tract of soft tissue inflammation extending across the peritoneum to the gallbladder fossa.  However, there is no evidence of abscess. A significant amount of air is noted at the gallbladder fossa, possibly reflecting leakage from the cystic duct. No free fluid is seen to suggest significant bile leak; would correlate as to whether there is any evidence of bile leakage from the patient's abdominal wound.  A smaller defect is noted more inferiorly along the lateral abdominal wall, with associated soft tissue inflammation. This also extends to the level  of the fascia and musculature.  Air is seen tracking within the common hepatic duct and cystic duct and intrahepatic biliary ducts, likely corresponding to the air seen within the gallbladder fossa and possibly reflecting instrumentation at the duodenal ampulla. No definite retained stones are seen. Clips are noted along the gallbladder fossa. Mild soft tissue inflammation is noted at the right upper quadrant, extending about the second segment of the duodenum.  Mild fatty infiltration is again noted adjacent to the falciform ligament. The liver and spleen are otherwise grossly  unremarkable. The pancreas and adrenal glands are unremarkable.  The kidneys are unremarkable in appearance. There is no evidence of hydronephrosis. No renal or ureteral stones are seen. No perinephric stranding is appreciated.  No free fluid is identified. The small bowel is unremarkable in appearance. The stomach is within normal limits. No acute vascular abnormalities are seen. Mild scattered calcification is seen along the abdominal aorta and its branches.  The appendix is normal in caliber and contains air without evidence for appendicitis. The colon is unremarkable in appearance.  The bladder is mildly distended and grossly unremarkable. A small urachal remnant is incidentally seen. The uterus is within normal limits. The ureters are relatively symmetric. No suspicious adnexal masses are identified. No inguinal lymphadenopathy is seen.  A fem-fem bypass graft is again noted, with mild chronic associated soft tissue inflammation. 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  No acute osseous abnormalities are identified.  IMPRESSION: 1. Significant amount of air noted at the gallbladder fossa. This raises concern for leakage of air from the cystic duct. No free fluid is seen to suggest significant bile leak. However, there is suggestion of a tiny tract to the abdominal wall. Would correlate as to whether there is any evidence of bile leakage from the patient's abdominal wound. 2. Large soft tissue defect at the right anterior abdominal wall, tracking over the location of the gallbladder fossa, with associated soft tissue inflammation. Underlying fascial defect noted, with a partial defect in the musculature of the right lateral abdominal wall. Trace fluid seen tracking along the musculature, with a small apparent tract of soft tissue inflammation extending across the peritoneum to the gallbladder fossa. 3. Smaller abdominal wall defect noted more  inferiorly along the lateral abdominal wall, with associated soft tissue inflammation. This also extends to the level of the fascia and musculature. 4. No abscess seen at this time. 5. Air noted within the common hepatic duct, cystic duct and intrahepatic biliary ducts, likely reflecting instrumentation at the duodenal ampulla, and corresponding to the large amount of air at the gallbladder fossa. Mild soft tissue inflammation noted at the right upper quadrant, extending about the second segment of the duodenum. 6. Mild scattered calcification along the abdominal aorta and its branches. 7. Fem-fem bypass graft demonstrates mild chronic soft tissue inflammation, grossly unchanged from the prior study. 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  These results were called by telephone at the time of interpretation on 10/18/2013 at 12:48 am to Dr. Johney Maine, who verbally acknowledged these results.   Electronically Signed   By: Garald Balding M.D.   On: 10/18/2013 00:54    Anti-infectives: Anti-infectives   Start     Dose/Rate Route Frequency Ordered Stop   10/18/13 0000  piperacillin-tazobactam (ZOSYN) IVPB 3.375 g     3.375 g 12.5 mL/hr over 240 Minutes Intravenous Every 8 hours 10/17/13 1710  10/17/13 1715  piperacillin-tazobactam (ZOSYN) IVPB 3.375 g     3.375 g 100 mL/hr over 30 Minutes Intravenous  Once 10/17/13 1712         Assessment/Plan 1. POD 1, s/p incision and drainage of RUQ incision 2. S/p open cholecystectomy 3. Nausea, improved today 4. CAD 5. COPD 6. HTN  Plan: 1. Will start NS WD changes to abdominal wounds BID 2. Await cultures 3. Cont zosyn 4. Restart Plavix 5. Hold parameters given for BP meds 6. HH for dressing changes at home. 7. Clear liquids, advance as tolerates  LOS: 1 day    OSBORNE,KELLY E 10/18/2013, 8:54 AM Pager: [phone]  Agree with above. Her CT scan shows some intra biliary air (she had an ERCP) and  the changes in the wound, but no other area of concern. In spite of wound, she is doing better than I would have thought. She is hungry.  Alphonsa Overall, MD, [facility] Surgery Pager: [phone] Office phone:  [phone]

## 2013-10-18 NOTE — Op Note (Signed)
NAMESAMIYA, Wendy              ACCOUNT NO.:  [id number]  MEDICAL RECORD NO.:  [id number]  LOCATION:  1751                         FACILITY:  [facility]  PHYSICIAN:  Fenton Malling. Lucia Gaskins, M.D.  DATE OF BIRTH:  [birth year]  DATE OF PROCEDURE:  10/17/2013                               OPERATIVE REPORT   PREOPERATIVE DIAGNOSIS:  Infection of right subcostal wound.  POSTOPERATIVE DIAGNOSIS:  Infection of right subcostal wound with dehiscence of anterior fascia laterally.  The posterior fascia appears intact.  PROCEDURE:  Incision and drainage of right subcostal wound and drain site wound.  SURGEON:  Fenton Malling. Lucia Gaskins, MD  ASSISTANT:  No first assistant.  ANESTHESIA:  General, supervised by Montez Hageman, MD.  ESTIMATED BLOOD LOSS:  75 mL.  I did leave both the right subcostal wound and the drain site wounds packed.  The wounds have already had cultures obtained, so there was no specimen.  HISTORY OF ILLNESS:  Wendy [name] is a 63 year old white female, who underwent a laparoscopic converted to open cholecystectomy on September 16, 2013, for acute cholecystitis by Dr. Coralie Keens.  She had some troubles at home, presented to the [facility] Emergency Room today, with pus draining from her right subcostal wound.  There is no way to open this wound under local anesthesia and I think she will be best served with general anesthesia to open the wound  She was brought to the operating room for incision and drainage of this wound.  We will plan CT scan of abdomen after incision and drainage and make better decisions based on those findings.  OPERATIVE NOTE:  The patient was taken to room #1 and placed in a supine position with a general anesthesia supervised by Dr. Montez Hageman. She was given Zosyn at the beginning of the procedure.    A time-out was held and surgical checklist run.  I opened the right subcostal wound, which was grossly infected. Cultures have already been obtained in the emergency  room, so I did not repeat cultures.  There was dehiscence of the lateral 1/3rd of her anterior fascia.  Dr. [name] closed the original incision in 2 layers.  It appears that the posterior fascia is intact.  I am assuming it was just a superficial fascial layer which has broken down.    Also, she had some purulence draining from her drain site.  I opened this wound up making this about a 2 cm incision and placing my finger into it.  I irrigated the wounds with saline and peroxide and then packed it with saline 4 inch Curlex.  The wound looked okay by the time I had finished irrigating the wounds.  I did not see any evidence of a deep space infection below the fascia.  I will obtain a CT scan to evaluate her intra-abdominal cavity either later tonight or tomorrow.  After the wounds were sterilely dressed, the sponge and needle count were correct at the end of the case.  She was transferred to the recovery room in good condition with plan of admission on IV antibiotics and wound changes and go on from there.   Wendy [name] H. Lucia Gaskins, M.D., [facility]   DHN/MEDQ  D:  10/17/2013  T:  10/18/2013  Job:  [id number]  cc:   Coralie Keens, M.D. [address] [place] [address]  Dr. Daphene Jaeger

## 2013-10-19 LAB — WOUND CULTURE
Culture: NO GROWTH
Gram Stain: NONE SEEN
Special Requests: NORMAL

## 2013-10-19 MED ORDER — METOPROLOL TARTRATE 50 MG PO TABS
50.0000 mg | ORAL_TABLET | Freq: Two times a day (BID) | ORAL | Status: DC
  Administered 2013-10-21 – 2013-10-22 (×3): 50 mg via ORAL
  Filled 2013-10-19 (×7): qty 1

## 2013-10-19 MED ORDER — ALPRAZOLAM 1 MG PO TABS
1.0000 mg | ORAL_TABLET | Freq: Three times a day (TID) | ORAL | Status: DC | PRN
  Administered 2013-10-19 – 2013-10-21 (×4): 1 mg via ORAL
  Filled 2013-10-19 (×4): qty 1

## 2013-10-19 NOTE — Progress Notes (Signed)
I have reviewed the assessment and plan of care and I agree with the assessment. Pt resting without distress. SRP, RN

## 2013-10-19 NOTE — Progress Notes (Signed)
Pham Med Rec reviewed patient's at home Rx for Xanax. Report from previous shift RN stated that once we had verified the correct dose with Pharmacy we would be able to page and the dose would be changed in the orders. Will page for order and continue to monitor pt.

## 2013-10-19 NOTE — Progress Notes (Signed)
Patient ID: Wendy [name], female   DOB: Apr 19, 1950, 63 y.o.   MRN: [id number] 2 Days Post-Op  Subjective: Doesn't feel well. General malaise without any specific complaints. Denies abdominal pain. Tolerating a full liquid diet.  Objective: Vital signs in last 24 hours: Temp:  [98.1 F (36.7 C)-98.2 F (36.8 C)] 98.1 F (36.7 C) (08/15 0507) Pulse Rate:  [50-61] 54 (08/15 0507) Resp:  [16-18] 18 (08/15 0507) BP: (84-108)/(30-49) 98/47 mmHg (08/15 0507) SpO2:  [98 %-100 %] 99 % (08/15 1025) Last BM Date: 10/17/13  Intake/Output from previous day: 08/14 0701 - 08/15 0700 In: 2450.8 [P.O.:480; I.V.:1870.8; IV Piggyback:100] Out: 1950 [FTDDU:2025] Intake/Output this shift: Total I/O In: 1210.4 [I.V.:1210.4] Out: -   General appearance: cooperative, fatigued, no distress and moderately obese GI: mild incisional tenderness as expected but generally nontender.  Incision/Wound: Wound inspected and appears clean without unusual drainage or necrotic tissue.  Lab Results:   Recent Labs  10/17/13 1527 10/18/13 0530  WBC 10.1 5.6  HGB 11.0* 10.4*  HCT 35.5* 33.5*  PLT 296 269   BMET  Recent Labs  10/17/13 1527 10/18/13 0530  NA 135* 134*  K 3.7 3.9  CL 90* 93*  CO2 26 25  GLUCOSE 105* 179*  BUN 22 22  CREATININE 0.90 0.95  CALCIUM 10.2 9.5     Studies/Results: Ct Abdomen Pelvis W Contrast  10/18/2013   CLINICAL DATA:  Status post cholecystectomy, with persistent abdominal wound; status post recent debridement.  EXAM: CT ABDOMEN AND PELVIS WITH CONTRAST  TECHNIQUE: Multidetector CT imaging of the abdomen and pelvis was performed using the standard protocol following bolus administration of intravenous contrast.  CONTRAST:  119mL OMNIPAQUE IOHEXOL 300 MG/ML  SOLN  COMPARISON:  CT of the abdomen and pelvis performed 09/15/2013  FINDINGS: Minimal bibasilar atelectasis is noted. The patient is status post median sternotomy.  A large soft tissue defect is noted in the right  anterior abdominal wall, tracking over the location of the gallbladder fossa, with associated soft tissue inflammation. There is an associated defect in the fascia, with a partial defect in the musculature of the right lateral abdominal wall. Trace fluid is seen tracking along the musculature of the abdominal wall, with a small apparent tract of soft tissue inflammation extending across the peritoneum to the gallbladder fossa.  However, there is no evidence of abscess. A significant amount of air is noted at the gallbladder fossa, possibly reflecting leakage from the cystic duct. No free fluid is seen to suggest significant bile leak; would correlate as to whether there is any evidence of bile leakage from the patient's abdominal wound.  A smaller defect is noted more inferiorly along the lateral abdominal wall, with associated soft tissue inflammation. This also extends to the level of the fascia and musculature.  Air is seen tracking within the common hepatic duct and cystic duct and intrahepatic biliary ducts, likely corresponding to the air seen within the gallbladder fossa and possibly reflecting instrumentation at the duodenal ampulla. No definite retained stones are seen. Clips are noted along the gallbladder fossa. Mild soft tissue inflammation is noted at the right upper quadrant, extending about the second segment of the duodenum.  Mild fatty infiltration is again noted adjacent to the falciform ligament. The liver and spleen are otherwise grossly unremarkable. The pancreas and adrenal glands are unremarkable.  The kidneys are unremarkable in appearance. There is no evidence of hydronephrosis. No renal or ureteral stones are seen. No perinephric stranding is appreciated.  No  free fluid is identified. The small bowel is unremarkable in appearance. The stomach is within normal limits. No acute vascular abnormalities are seen. Mild scattered calcification is seen along the abdominal aorta and its branches.   The appendix is normal in caliber and contains air without evidence for appendicitis. The colon is unremarkable in appearance.  The bladder is mildly distended and grossly unremarkable. A small urachal remnant is incidentally seen. The uterus is within normal limits. The ureters are relatively symmetric. No suspicious adnexal masses are identified. No inguinal lymphadenopathy is seen.  A fem-fem bypass graft is again noted, with mild chronic associated soft tissue inflammation. 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  No acute osseous abnormalities are identified.  IMPRESSION: 1. Significant amount of air noted at the gallbladder fossa. This raises concern for leakage of air from the cystic duct. No free fluid is seen to suggest significant bile leak. However, there is suggestion of a tiny tract to the abdominal wall. Would correlate as to whether there is any evidence of bile leakage from the patient's abdominal wound. 2. Large soft tissue defect at the right anterior abdominal wall, tracking over the location of the gallbladder fossa, with associated soft tissue inflammation. Underlying fascial defect noted, with a partial defect in the musculature of the right lateral abdominal wall. Trace fluid seen tracking along the musculature, with a small apparent tract of soft tissue inflammation extending across the peritoneum to the gallbladder fossa. 3. Smaller abdominal wall defect noted more inferiorly along the lateral abdominal wall, with associated soft tissue inflammation. This also extends to the level of the fascia and musculature. 4. No abscess seen at this time. 5. Air noted within the common hepatic duct, cystic duct and intrahepatic biliary ducts, likely reflecting instrumentation at the duodenal ampulla, and corresponding to the large amount of air at the gallbladder fossa. Mild soft tissue inflammation noted at the right upper quadrant,  extending about the second segment of the duodenum. 6. Mild scattered calcification along the abdominal aorta and its branches. 7. Fem-fem bypass graft demonstrates mild chronic soft tissue inflammation, grossly unchanged from the prior study. 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  These results were called by telephone at the time of interpretation on 10/18/2013 at 12:48 am to Dr. Johney Maine, who verbally acknowledged these results.   Electronically Signed   By: Garald Balding M.D.   On: 10/18/2013 00:54    Anti-infectives: Anti-infectives   Start     Dose/Rate Route Frequency Ordered Stop   10/18/13 0000  piperacillin-tazobactam (ZOSYN) IVPB 3.375 g     3.375 g 12.5 mL/hr over 240 Minutes Intravenous Every 8 hours 10/17/13 1710     10/17/13 1715  piperacillin-tazobactam (ZOSYN) IVPB 3.375 g     3.375 g 100 mL/hr over 30 Minutes Intravenous  Once 10/17/13 1712        Assessment/Plan: s/p Procedure(s): INCISION AND DRAINAGE RIGHT SUBCOSTAL WOUND Wound doing well without evidence of infection. Continue antibiotics for now and current wound care. BP running a little. We will reduce metoprolol dose    LOS: 2 days    Tionne Carelli T 10/19/2013

## 2013-10-20 NOTE — Progress Notes (Signed)
Patient ID: Wendy [name], female   DOB: 1950/12/03, 63 y.o.   MRN: [id number] [facility] Progress Note:   3 Days Post-Op  Subjective: Mental status is clear Objective: Vital signs in last 24 hours: Temp:  [97.4 F (36.3 C)-97.5 F (36.4 C)] 97.4 F (36.3 C) (08/16 0617) Pulse Rate:  [60-72] 72 (08/16 0617) Resp:  [18-20] 18 (08/16 0617) BP: (100-105)/(46-50) 105/50 mmHg (08/16 0617) SpO2:  [97 %-100 %] 100 % (08/16 0821)  Intake/Output from previous day: 08/15 0701 - 08/16 0700 In: 5035.4 [P.O.:1800; I.V.:3085.4; IV Piggyback:150] Out: 1884 [Urine:1575] Intake/Output this shift:    Physical Exam: Work of breathing is normal.  Wound packing occurring on a daily basis.  Edges are not red.    Lab Results:  No results found for this or any previous visit (from the past 48 hour(s)).  Radiology/Results: No results found.  Anti-infectives: Anti-infectives   Start     Dose/Rate Route Frequency Ordered Stop   10/18/13 0000  piperacillin-tazobactam (ZOSYN) IVPB 3.375 g     3.375 g 12.5 mL/hr over 240 Minutes Intravenous Every 8 hours 10/17/13 1710     10/17/13 1715  piperacillin-tazobactam (ZOSYN) IVPB 3.375 g     3.375 g 100 mL/hr over 30 Minutes Intravenous  Once 10/17/13 1712        Assessment/Plan: Problem List: Patient Active Problem List   Diagnosis Date Noted  . Wound infection after surgery 10/17/2013  . Acute respiratory failure 09/20/2013  . Nonspecific abnormal results of liver function study 09/17/2013  . Cholecystitis, acute 09/15/2013  . PAD (peripheral artery disease)   . COPD (chronic obstructive pulmonary disease)   . Peripheral neuropathy   . Chronic back pain   . Sleep apnea   . Anxiety   . GERD (gastroesophageal reflux disease)   . 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 Hypertension 07/20/2013  . HLD (hyperlipidemia) 07/20/2013  . Right groin wound 07/20/2013  . Unstable angina 07/19/2013  . CAD (coronary artery disease) 07/19/2013     Will likely need home health care to assist with dressing changes for a while.  Not ready for discharge 3 Days Post-Op    LOS: 3 days   Matt B. Hassell Done, MD, [facility] [phone] beeper [phone]  10/20/2013 8:24 AM

## 2013-10-20 NOTE — Progress Notes (Signed)
Prior to HS dressing change pt very anxious, reporting high level of pain and requesting prns frequently.  Tolerated dressing change very well.  After which pt prn request decreased dramatically

## 2013-10-20 NOTE — Progress Notes (Signed)
Report recd from off going RN. No change  in initial am assessment at this time.Cont with plan of care 

## 2013-10-20 NOTE — Progress Notes (Signed)
CARE MANAGEMENT NOTE 10/20/2013  Patient:  Wendy [name], Wendy [name]   Account Number:  [id number]  Date Initiated:  10/18/2013  Documentation initiated by:  Gabriel Earing  Subjective/Objective Assessment:   Pt admitted Right upper quadrant wound infection     Action/Plan:   from home   Anticipated DC Date:  10/21/2013   Anticipated DC Plan:  [place]  CM consult      [facility] Choice  HOME HEALTH   Choice offered to / List presented to:  C-1 Patient        [place] arranged  HH-1 RN      [place].   Status of service:  Completed, signed off Medicare Important Message given?  YES (If response is "NO", the following Medicare IM given date fields will be blank) Date Medicare IM given:  10/19/2013 Medicare IM given by:  [facility] Date Additional Medicare IM given:   Additional Medicare IM given by:    Discharge Disposition:  [place]  Per UR Regulation:  Reviewed for med. necessity/level of care/duration of stay  If discussed at [place] of Stay Meetings, dates discussed:    Comments:  10/20/2013 1725 NCM spoke to pt and offered choice for [facility]. Pt agreeable to [facility] for HH. DME requested. Pt states she needs a tub bench. Lives at home with friend, Tally Due that can assist with dressing changes at home. Jonnie Finner RN CCM Case Mgmt phone [phone]   10/18/13 MMcGibboney, RN, BSN Chart reviewed.

## 2013-10-21 ENCOUNTER — Encounter (HOSPITAL_COMMUNITY): Payer: Self-pay | Admitting: Cardiovascular Disease

## 2013-10-21 ENCOUNTER — Encounter (INDEPENDENT_AMBULATORY_CARE_PROVIDER_SITE_OTHER): Payer: Medicare Other | Admitting: Surgery

## 2013-10-21 DIAGNOSIS — R079 Chest pain, unspecified: Secondary | ICD-10-CM | POA: Diagnosis present

## 2013-10-21 DIAGNOSIS — I059 Rheumatic mitral valve disease, unspecified: Secondary | ICD-10-CM

## 2013-10-21 DIAGNOSIS — K219 Gastro-esophageal reflux disease without esophagitis: Secondary | ICD-10-CM

## 2013-10-21 DIAGNOSIS — R0789 Other chest pain: Secondary | ICD-10-CM

## 2013-10-21 DIAGNOSIS — I2510 Atherosclerotic heart disease of native coronary artery without angina pectoris: Secondary | ICD-10-CM

## 2013-10-21 DIAGNOSIS — I10 Essential (primary) hypertension: Secondary | ICD-10-CM

## 2013-10-21 DIAGNOSIS — E785 Hyperlipidemia, unspecified: Secondary | ICD-10-CM

## 2013-10-21 LAB — CULTURE, ROUTINE-ABSCESS: Special Requests: NORMAL

## 2013-10-21 LAB — TROPONIN I: Troponin I: <0.3 ng/mL (ref <0.30)

## 2013-10-21 MED ORDER — PANTOPRAZOLE SODIUM 40 MG PO TBEC
40.0000 mg | DELAYED_RELEASE_TABLET | Freq: Every day | ORAL | Status: DC
  Administered 2013-10-21 – 2013-10-22 (×2): 40 mg via ORAL
  Filled 2013-10-21 (×2): qty 1

## 2013-10-21 NOTE — Progress Notes (Signed)
Echocardiogram 2D Echocardiogram has been performed.  Joelene Millin 10/21/2013, 12:51 PM

## 2013-10-21 NOTE — Consult Note (Signed)
Patient ID: Wendy [name] MRN: [id number] DOB/AGE: Jul 01, 1950 63 y.o.  Admit date: 10/17/2013  Primary Cardiologist: [name] saw her here in may 2015. Primary cardiology is Dr. [name] in [place] Reason for Consultation: Chest pain  HPI: 63 yo female with history of CAD s/p CABG, HTN, HLD, PAD, COPD, GERD admitted with wound infection 10/17/13 after undergoing cholecystectomy 09/15/13. She has had surgical exploration of the wound and is now on IV antibiiotics. Her cardiac disease is followed by Dr. [name] in [place]. She CABG in 1998 and last PCI in 2013. In May 2015 she underwent cardiac cath at [facility] after being admitted with chest pain. Dr. [name] admitted her and Dr. Ellyn Hack performed her cardiac cath. This demonstrated minimal LAD disease with atretic LIMA graft, patent graft to the Circumflex and RCA systems. No PCI was indicated.   She tells me now that she has been having a strange sensation in her chest since her surgery one month ago. This always occurs at rest. Typically occurs if she leans forward. She feels her heart "flip" then there is a severe pain in her substernal area. She does not think this feels like her typical heartburn but also not like her typical angina. Pain lasts from 30 seconds to three minutes then resolves. No associated dyspnea, nausea or diaphoresis. This pain occurs several times per week.    Past Medical History  Diagnosis Date  . Glucose intolerance (impaired glucose tolerance)   . Hypertension   . PAD (peripheral artery disease)   . COPD (chronic obstructive pulmonary disease)   . Peripheral neuropathy   . Chronic back pain   . Chronic leg pain   . CAD (coronary artery disease), native coronary artery     Hx CABG 1998, last PCI 2013; LHC 07/2013:  EF 55-60%, L-LAD prob atretic, no sig disease in LAD, S-OM1 ok with patent stent, S-dRCA ok => med Rx.  Marland Kitchen HLD (hyperlipidemia)   . Myocardial infarction   . Sleep apnea   . Shortness of breath     when  walking  . Anxiety   . GERD (gastroesophageal reflux disease)     Family History  Problem Relation Age of Onset  . Lung cancer Mother     History   Social History  . Marital Status: Legally Separated    Spouse Name: N/A    Number of Children: N/A  . Years of Education: N/A   Occupational History  . Disabled    Social History Main Topics  . Smoking status: Former Smoker    Quit date: 07/20/1998  . Smokeless tobacco: Never Used  . Alcohol Use: No  . Drug Use: No  . Sexual Activity: Not on file   Other Topics Concern  . Not on file   Social History Narrative   Lives with best friend.     Past Surgical History  Procedure Laterality Date  . Cardiac surgery  1998    LIMA-LAD, SVG-OM, SVG-RCA  . Coronary angioplasty with stent placement  11/2011, 02/2012    DES to SVG-RCA both times, In [place], [place], Dr. Bruce Donath  . Cervical spine surgery  1991 and again several years later  . Knee arthroscopy Right 1998  . Finger surgery Right     ring finger  . Orif humerus fracture Left 08/27/2013    Procedure: OPEN REDUCTION INTERNAL FIXATION (ORIF) LEFT HUMERUS ;  Surgeon: Rozanna Box, MD;  Location: [facility] [facility];  Service: Orthopedics;  Laterality: Left;  . Cholecystectomy  09/17/2013    dr [name]  . Cholecystectomy N/A 09/16/2013    Procedure: LAPAROSCOPIC CHOLECYSTECTOMY CONVERTED TO OPEN CHOLECYSTECTOMY WITH CHOLANGIOGRAM;  Surgeon: [name], MD;  Location: [facility];  Service: General;  Laterality: N/A;  . Ercp N/A 09/19/2013    Procedure: ENDOSCOPIC RETROGRADE CHOLANGIOPANCREATOGRAPHY (ERCP);  Surgeon: [name], MD;  Location: [facility];  Service: Endoscopy;  Laterality: N/A;  . I and d right subcostal wound  10/17/13  . Irrigation and debridement abscess Right 10/17/2013    Procedure: INCISION AND DRAINAGE RIGHT SUBCOSTAL WOUND;  Surgeon: [name], MD;  Location: [facility];  Service: General;  Laterality: Right;    Allergies  Allergen Reactions  . Benadryl  [Diphenhydramine] Shortness Of Breath  . Adhesive [Tape]     Burning of skin  . Amoxicillin Nausea And Vomiting  . Flexeril [Cyclobenzaprine] Other (See Comments)    States that it messes with her blood circulation; pt states that she has to walk around   Hospital Medications:  . antiseptic oral rinse  7 mL Mouth Rinse q12n4p  . aspirin EC  81 mg Oral Daily  . chlorhexidine  15 mL Mouth Rinse BID  . clopidogrel  75 mg Oral Daily  . feeding supplement (RESOURCE BREEZE)  1 Container Oral BID BM  . fluticasone  2 puff Inhalation BID  . metoprolol  50 mg Oral BID  . mometasone-formoterol  2 puff Inhalation BID  . pantoprazole  40 mg Oral Daily  . piperacillin-tazobactam (ZOSYN)  IV  3.375 g Intravenous Q8H  . piperacillin-tazobactam  3.375 g Intravenous Once  . saccharomyces boulardii  250 mg Oral BID  . sodium chloride  1,000 mL Intravenous Once  . tiotropium  18 mcg Inhalation Daily     Review of systems complete and found to be negative unless listed above   Physical Exam: Blood pressure 107/53, pulse 71, temperature 97.5 F (36.4 C), temperature source Oral, resp. rate 18, height 5\' 2"  (1.575 m), weight 183 lb 3.2 oz (83.099 kg), SpO2 94.00%.    General: Well developed, well nourished, NAD  HEENT: OP clear, mucus membranes moist  SKIN: warm, dry. No rashes.  Neuro: No focal deficits  Musculoskeletal: Muscle strength 5/5 all ext  Psychiatric: Mood and affect normal  Neck: No JVD, no carotid bruits, no thyromegaly, no lymphadenopathy.  Lungs:Clear bilaterally, no wheezes, rhonci, crackles  Cardiovascular: Regular rate and rhythm. No murmurs, gallops or rubs.  Abdomen:Soft. Bowel sounds present. Non-tender.  Extremities: No lower extremity edema. Pulses are 2 + in the bilateral DP/PT.   Labs:   Lab Results  Component Value Date   WBC 5.6 10/18/2013   HGB 10.4* 10/18/2013   HCT 33.5* 10/18/2013   MCV 86.3 10/18/2013   PLT 269 10/18/2013     Recent Labs Lab  10/17/13 1527 10/18/13 0530  NA 135* 134*  K 3.7 3.9  CL 90* 93*  CO2 26 25  BUN 22 22  CREATININE 0.90 0.95  CALCIUM 10.2 9.5  PROT 8.4*  --   BILITOT 0.4  --   ALKPHOS 102  --   ALT 14  --   AST 25  --   GLUCOSE 105* 179*   Cardiac cath Jul 19, 2013: Coronary Anatomy:  Left Main: Large caliber vessel that bifurcates distally into the LAD & Circumflex. Angiographically normal. LAD: Moderate-Large caliber vessel with mild diffuse luminal irregularities, the vessel reaches down to the apex it gives off 2 diagonal branches both of which bifurcate.  One is proximal, the other is in the mid LAD. There is a defined string sign him what was the LIMA graft that is likely atretic. The LAD has no real significant disease.  D1: Small-caliber vessel with a mid 60-70% stenosis. Not PCI amenable.  D2: Small to moderate caliber vessel that bifurcates. Minimal luminal irregularities.  LIMA-LAD: Not visualized using subclavian angiography. There is a slight string sign at what appears to be the ostium. Likely occluded. Left Subclavian Artery: Ostial roughly 60% stenosis Left Circumflex: Moderate caliber vessel that terminates in the AV groove. Diffuse distal disease.  SVG-OM1: Widely patent graft to a trifurcating distal circumflex/OM with 3 branches. The branches are small in diameter but without significant disease. There is a patent stent in the distal portion of the graft. RCA: Small caliber vessel it is chronically occluded in the mid vessel. The upstream vessels very small.  SVG-distal RCA: Antegrade flow fills a moderate caliber PDA as well as Posterior Atrioventricular Groove Vessel branch RPAV). Retrograde flow perfuses up to the mid RCA.  RPDA: Moderate caliber vessel, no significant disease  RPL Sysytem:The RPAV is a moderate caliber vessel that gives off 3 small posterior lateral branches.    EKG: 10/17/13: Sinus. Non-specific inferior ST changes.   ASSESSMENT:   1. CAD s/p CABG with  most recent cardiac cath May 2015 with stable CAD, 2/3 patent grafts 2. Chest pain with atypical features 3. HTN 4. Hyperlipidemia 5. GERD  Plan:  I do not think her chest pain is cardiac related. She has had a recent cardiac cath 3 months ago with stable disease. EKG on admission without ischemic changes. I do not think ischemic testing is indicated at this time. Will arrange echo today to exclude pericardial effusion and assess LV function. Will order troponin x 1 this am and EKG. Her pain is most likely related to GERD. Continue ASA, Plavix, beta blocker. Continue PPI. We will follow with you.    Signed: Velvet Moomaw 10/21/2013, 10:10 AM

## 2013-10-21 NOTE — Clinical Documentation Improvement (Signed)
Possible Clinical Conditions? Severe Malnutrition   Protein Calorie Malnutrition Severe Protein Calorie Malnutrition Other Condition Cannot clinically determine  Supporting Information: INITIAL NUTRITION ASSESSMENT by Toribio Harbour, RD at 10/18/2013 11:55 AM  Pt meets criteria for severe MALNUTRITION in the context of chronic illness as evidenced by 5.6% weight loss with <75% estimated energy intake in the past month.    DOCUMENTATION CODES  Per approved criteria   -Severe malnutrition in the context of chronic illness  -Obesity Unspecified    INTERVENTION: - Diet advancement per MD - Resource Breeze BID - Multivitamin 1 tablet PO daily - Discussed low fat diet therapy for gallbladder removal   - RD to continue to monitor   NUTRITION DIAGNOSIS: Inadequate oral intake related to clear liquid diet as evidenced by diet order.   Thank You, Alessandra Grout, RN, BSN, CCDS,Clinical Documentation Specialist:  [phone]  [phone]=[facility] [place]- Health Information Management

## 2013-10-21 NOTE — Progress Notes (Signed)
4 Days Post-Op  Subjective: She has been having intermittent substernal chest pain.  No dyspnea with it.  Not like her reflux pain.  Objective: Vital signs in last 24 hours: Temp:  [97.4 F (36.3 C)-98.2 F (36.8 C)] 97.5 F (36.4 C) (08/17 0557) Pulse Rate:  [62-75] 71 (08/16 2026) Resp:  [18-20] 18 (08/17 0557) BP: (103-120)/(41-63) 107/53 mmHg (08/17 0557) SpO2:  [95 %-100 %] 99 % (08/17 0557) Last BM Date: 10/20/13  Intake/Output from previous day: 08/16 0701 - 08/17 0700 In: 4090 [P.O.:2040; I.V.:2000; IV Piggyback:50] Out: 2401 [Urine:2400; Stool:1] Intake/Output this shift:    PE: General- In NAD CV-RRR Abdomen-soft, RUQ and drain sites wounds are clean, they were dressed.  Lab Results:  No results found for this basename: WBC, HGB, HCT, PLT,  in the last 72 hours BMET No results found for this basename: NA, K, CL, CO2, GLUCOSE, BUN, CREATININE, CALCIUM,  in the last 72 hours PT/INR No results found for this basename: LABPROT, INR,  in the last 72 hours Comprehensive Metabolic Panel:    Component Value Date/Time   NA 134* 10/18/2013 0530   NA 135* 10/17/2013 1527   K 3.9 10/18/2013 0530   K 3.7 10/17/2013 1527   CL 93* 10/18/2013 0530   CL 90* 10/17/2013 1527   CO2 25 10/18/2013 0530   CO2 26 10/17/2013 1527   BUN 22 10/18/2013 0530   BUN 22 10/17/2013 1527   CREATININE 0.95 10/18/2013 0530   CREATININE 0.90 10/17/2013 1527   GLUCOSE 179* 10/18/2013 0530   GLUCOSE 105* 10/17/2013 1527   CALCIUM 9.5 10/18/2013 0530   CALCIUM 10.2 10/17/2013 1527   AST 25 10/17/2013 1527   AST 17 09/24/2013 1743   ALT 14 10/17/2013 1527   ALT 23 09/24/2013 1743   ALKPHOS 102 10/17/2013 1527   ALKPHOS 95 09/24/2013 1743   BILITOT 0.4 10/17/2013 1527   BILITOT 0.3 09/24/2013 1743   PROT 8.4* 10/17/2013 1527   PROT 7.0 09/24/2013 1743   ALBUMIN 2.7* 10/17/2013 1527   ALBUMIN 2.9* 09/24/2013 1743     Studies/Results: No results found.  Anti-infectives: Anti-infectives   Start      Dose/Rate Route Frequency Ordered Stop   10/18/13 0000  piperacillin-tazobactam (ZOSYN) IVPB 3.375 g     3.375 g 12.5 mL/hr over 240 Minutes Intravenous Every 8 hours 10/17/13 1710     10/17/13 1715  piperacillin-tazobactam (ZOSYN) IVPB 3.375 g     3.375 g 100 mL/hr over 30 Minutes Intravenous  Once 10/17/13 1712        Assessment Principal Problem:   Wound infection after surgery-wound looks good; on IV abxs. Active Problems:   Chest pain   CAD (coronary artery disease)    LOS: 4 days   Plan: Continue wound care and IV abxs.  Consult Cardiology.   Cobey Raineri J 10/21/2013

## 2013-10-21 NOTE — Progress Notes (Signed)
[facility]  [facility] is providing the following services: Patient declined tub bench because she isn't sure if it would fit in her bathroom.  She stated that her bathroom is very small and does not know if a tub seat would work better or not.  Suggested that if she has in home therapy, the therapist may be able to evaluate her bathroom and make the necessary recommendations.  She agreed, and I have notified the CM.  If patient discharges after hours, please call [phone].   Linward Headland 10/21/2013, 9:55 AM

## 2013-10-22 MED ORDER — ONDANSETRON 4 MG PO TBDP: 4.0000 mg | ORAL_TABLET | Freq: Three times a day (TID) | ORAL | Status: DC | PRN

## 2013-10-22 MED ORDER — OXYCODONE-ACETAMINOPHEN 5-325 MG PO TABS: 1.0000 | ORAL_TABLET | Freq: Four times a day (QID) | ORAL | Status: DC | PRN

## 2013-10-22 MED ORDER — ALPRAZOLAM 0.5 MG PO TABS: 0.5000 mg | ORAL_TABLET | Freq: Three times a day (TID) | ORAL | Status: DC | PRN

## 2013-10-22 MED ORDER — SULFAMETHOXAZOLE-TMP DS 800-160 MG PO TABS: 1.0000 | ORAL_TABLET | Freq: Two times a day (BID) | ORAL | Status: DC

## 2013-10-22 NOTE — Discharge Summary (Signed)
Patient seen.  Agree with discharge summary.

## 2013-10-22 NOTE — Progress Notes (Signed)
Patient discharged to home, abdominal dressing changed prior to d/c, incision site granulating, beefy red wound bed, draining serosanguinous liquid in minimal amount.  health teaching done, verbalized understanding. PIV removed no s/s of infiltration or swelling noted on insertion site.

## 2013-10-22 NOTE — Discharge Instructions (Signed)
Dressing Change °A dressing is a material placed over wounds. It keeps the wound clean, dry, and protected from further injury. This provides an environment that favors wound healing.  °BEFORE YOU BEGIN °· Get your supplies together. Things you may need include: °¨ Saline solution. °¨ Flexible gauze dressing. °¨ Medicated cream. °¨ Tape. °¨ Gloves. °¨ Abdominal dressing pads. °¨ Gauze squares. °¨ Plastic bags. °· Take pain medicine 30 minutes before the dressing change if you need it. °· Take a shower before you do the first dressing change of the day. Use plastic wrap or a plastic bag to prevent the dressing from getting wet. °REMOVING YOUR OLD DRESSING  °· Wash your hands with soap and water. Dry your hands with a clean towel. °· Put on your gloves. °· Remove any tape. °· Carefully remove the old dressing. If the dressing sticks, you may dampen it with warm water to loosen it, or follow your caregiver's specific directions. °· Remove any gauze or packing tape that is in your wound. °· Take off your gloves. °· Put the gloves, tape, gauze, or any packing tape into a plastic bag. °CHANGING YOUR DRESSING °· Open the supplies. °· Take the cap off the saline solution. °· Open the gauze package so that the gauze remains on the inside of the package. °· Put on your gloves. °· Clean your wound as told by your caregiver. °· If you have been told to keep your wound dry, follow those instructions. °· Your caregiver may tell you to do one or more of the following: °¨ Pick up the gauze. Pour the saline solution over the gauze. Squeeze out the extra saline solution. °¨ Put medicated cream or other medicine on your wound if you have been told to do so. °¨ Put the solution soaked gauze only in your wound, not on the skin around it. °¨ Pack your wound loosely or as told by your caregiver. °¨ Put dry gauze on your wound. °¨ Put abdominal dressing pads over the dry gauze if your wet gauze soaks through. °· Tape the abdominal dressing  pads in place so they will not fall off. Do not wrap the tape completely around the affected part (arm, leg, abdomen). °· Wrap the dressing pads with a flexible gauze dressing to secure it in place. °· Take off your gloves. Put them in the plastic bag with the old dressing. Tie the bag shut and throw it away. °· Keep the dressing clean and dry until your next dressing change. °· Wash your hands. °SEEK MEDICAL CARE IF: °· Your skin around the wound looks red. °· Your wound feels more tender or sore. °· You see pus in the wound. °· Your wound smells bad. °· You have a fever. °· Your skin around the wound has a rash that itches and burns. °· You see black or yellow skin in your wound that was not there before. °· You feel nauseous, throw up, and feel very tired. °Document Released: 03/31/2004 Document Revised: 05/16/2011 Document Reviewed: 01/03/2011 °ExitCare® Patient Information ©2015 [employer]. This information is not intended to replace advice given to you by your health care provider. Make sure you discuss any questions you have with your health care provider. ° °

## 2013-10-22 NOTE — Discharge Summary (Signed)
Patient ID: Wendy [name] MRN: [id number] DOB/AGE: 06/04/1950 63 y.o.  Admit date: 10/17/2013 Discharge date: 10/22/2013  Procedures: Incision and drainage of right subcostal wound and drain site wound.  Consults: cardiology  Reason for Admission: This is a 63 year old white female who underwent an open cholecystectomy by Dr. Nedra Hai on 09/15/2013. The patient has been struggling at home with nausea and vomiting on and off since her discharge. She has not been feeling well and has been fatigued. She saw Dr. [name] in the office on August 4. Her staples were discontinued at that time. He ordered some labs however these are not visible and Epic and it's unclear whether they have actually been drawn or not. The patient's sister called our office today stating she has been nursing this wound along for the last week. She states her right upper quadrant incision "busted" last night with prelude drainage. She also states her old JP drain site opened up and began draining purulent drainage as well. She had a fever overnight of 101.3. Our office referred her to [facility] emergency department today for further evaluation. She denies intra-abdominal pain, just pain around her skin level. We have been asked to evaluate the patient for further evaluation and admission.  Admission Diagnoses:  1. Status post laparoscopic converted to open cholecystectomy  2. Postoperative wound infection - right subcostal Patient Active Problem List   Diagnosis Date Noted  . Chest pain 10/21/2013  . Wound infection after surgery 10/17/2013  . Acute respiratory failure 09/20/2013  . Nonspecific abnormal results of liver function study 09/17/2013  . Cholecystitis, acute 09/15/2013  . PAD (peripheral artery disease)   . COPD (chronic obstructive pulmonary disease)   . Peripheral neuropathy   . Chronic back pain   . Sleep apnea   . Anxiety   . GERD (gastroesophageal reflux disease)   . 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 Hypertension 07/20/2013  . HLD (hyperlipidemia) 07/20/2013  . Right groin wound 07/20/2013  . Unstable angina 07/19/2013  . CAD (coronary artery disease) 07/19/2013    Hospital Course:  The patient was admitted and taken to the operating room after being placed on Zosyn. She underwent an incision and drainage of her right upper quadrant abscess. Following this she had a CT scan of her abdomen and pelvis to rule out any intra-abdominal problems. This was essentially negative. She begin dressing changes on postoperative day 1. She was started on clear liquids. Over the next several days her diet was able to be advanced as tolerated. She continued on IV Zosyn. On postoperative day #4, the patient was able to tolerate her dressing changes with oral pain medicine and she was otherwise doing well. She was stable for discharge home from a surgical standpoint.  On postoperative day 3, she began having some atypical chest pain. Given her cardiac history, cardiology was asked to evaluate her. Her EKG was normal. They do not feel that her chest pain is cardiac related. No further interventions were made. She is encouraged to followup as an outpatient.  PE: Abdomen: Soft, appropriately tender in the right upper quadrant, which is clean and packed.  Discharge Diagnoses:  Principal Problem:   Wound infection after surgery Active Problems:   CAD (coronary artery disease)   Chest pain  status post incision and drainage of right upper quadrant abscesses x2 Patient Active Problem List   Diagnosis Date Noted  . Chest pain 10/21/2013  . Wound infection after surgery 10/17/2013  . Acute respiratory failure 09/20/2013  .  Nonspecific abnormal results of liver function study 09/17/2013  . Cholecystitis, acute 09/15/2013  . PAD (peripheral artery disease)   . COPD (chronic obstructive pulmonary disease)   . Peripheral neuropathy   . Chronic back pain   . Sleep apnea   . Anxiety   . GERD  (gastroesophageal reflux disease)   . 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 Hypertension 07/20/2013  . HLD (hyperlipidemia) 07/20/2013  . Right groin wound 07/20/2013  . Unstable angina 07/19/2013  . CAD (coronary artery disease) 07/19/2013     Discharge Medications:   Medication List         albuterol 108 (90 BASE) MCG/ACT inhaler  Commonly known as:  PROVENTIL HFA;VENTOLIN HFA  Inhale 2 puffs into the lungs every 6 (six) hours as needed for wheezing or shortness of breath.     ALPRAZolam 0.5 MG tablet  Commonly known as:  XANAX  Take 1 mg by mouth at bedtime as needed for anxiety.     aspirin EC 81 MG tablet  Take 81 mg by mouth daily.     atorvastatin 10 MG tablet  Commonly known as:  LIPITOR  Take 1 tablet (10 mg total) by mouth at bedtime.     beclomethasone 80 MCG/ACT inhaler  Commonly known as:  QVAR  Inhale 2 puffs into the lungs 2 (two) times daily.     cetirizine 10 MG tablet  Commonly known as:  ZYRTEC  Take 10 mg by mouth daily.     Cyanocobalamin 1000 MCG/ML Kit  Inject 1,000 mcg as directed every 30 (thirty) days. 15th of each month     DSS 100 MG Caps  Take 100 mg by mouth 2 (two) times daily.     ezetimibe 10 MG tablet  Commonly known as:  ZETIA  Take 10 mg by mouth daily.     Fluticasone-Salmeterol 250-50 MCG/DOSE Aepb  Commonly known as:  ADVAIR  Inhale 1 puff into the lungs 2 (two) times daily.     metoprolol 100 MG tablet  Commonly known as:  LOPRESSOR  Take 1 tablet (100 mg total) by mouth 2 (two) times daily.     mometasone-formoterol 100-5 MCG/ACT Aero  Commonly known as:  DULERA  Inhale 2 puffs into the lungs 2 (two) times daily as needed for wheezing.     nitroGLYCERIN 0.4 MG SL tablet  Commonly known as:  NITROSTAT  Place 0.4 mg under the tongue every 5 (five) minutes as needed for chest pain.     omeprazole 40 MG capsule  Commonly known as:  PRILOSEC  Take 40 mg by mouth 2 (two) times daily.     ondansetron 4 MG disintegrating  tablet  Commonly known as:  ZOFRAN ODT  Take 1 tablet (4 mg total) by mouth every 8 (eight) hours as needed for nausea or vomiting.     oxyCODONE-acetaminophen 5-325 MG per tablet  Commonly known as:  PERCOCET/ROXICET  Take 1-2 tablets by mouth every 6 (six) hours as needed for severe pain.     sulfamethoxazole-trimethoprim 800-160 MG per tablet  Commonly known as:  BACTRIM DS  Take 1 tablet by mouth 2 (two) times daily.     TUDORZA PRESSAIR 400 MCG/ACT Aepb  Generic drug:  Aclidinium Bromide  Inhale 1 puff into the lungs daily.        Discharge Instructions:     Follow-up Information   Follow up with [facility]. (Home Health RN )    Contact information:   [phone]  [place] [address] [phone]       Follow up with [facility], MD. Schedule an appointment as soon as possible for a visit in 2 weeks.   Specialty:  General Surgery   Contact information:   [address] [place] [address] [phone]       Signed: Henreitta Cea 10/22/2013, 10:11 AM

## 2013-10-22 NOTE — Consult Note (Signed)
Patient ID: Wendy [name] MRN: [id number] DOB/AGE: [birth year] 63 y.o.  Admit date: 10/17/2013  Primary Cardiologist: [name] saw her here in may 2015. Primary cardiology is Dr. [name] in [place] Reason for Consultation: Chest pain  HPI: 63 yo female with history of CAD s/p CABG, HTN, HLD, PAD, COPD, GERD admitted with wound infection 10/17/13 after undergoing cholecystectomy 09/15/13. She has had surgical exploration of the wound and is now on IV antibiiotics. Her cardiac disease is followed by Dr. [name] in [place]. She CABG in 1998 and last PCI in 2013. In May 2015 she underwent cardiac cath at [facility] after being admitted with chest pain. Dr. [name] admitted her and Dr. Ellyn Hack performed her cardiac cath. This demonstrated minimal LAD disease with atretic LIMA graft, patent graft to the Circumflex and RCA systems.  D1 had a mid 60-70% stenosis.   No PCI was indicated.   She tells me now that she has been having a strange sensation in her chest since her surgery one month ago. This always occurs at rest. Typically occurs if she leans forward. She feels her heart "flip" then there is a severe pain in her substernal area. She does not think this feels like her typical heartburn but also not like her typical angina. Pain lasts from 30 seconds to three minutes then resolves. No associated dyspnea, nausea or diaphoresis. This pain occurs several times per week.      Past Medical History  Diagnosis Date  . Glucose intolerance (impaired glucose tolerance)   . Hypertension   . PAD (peripheral artery disease)   . COPD (chronic obstructive pulmonary disease)   . Peripheral neuropathy   . Chronic back pain   . Chronic leg pain   . CAD (coronary artery disease), native coronary artery     Hx CABG 1998, last PCI 2013; LHC 07/2013:  EF 55-60%, L-LAD prob atretic, no sig disease in LAD, S-OM1 ok with patent stent, S-dRCA ok => med Rx.  Marland Kitchen HLD (hyperlipidemia)   . Myocardial infarction   . Sleep apnea    . Shortness of breath     when walking  . Anxiety   . GERD (gastroesophageal reflux disease)     Family History  Problem Relation Age of [place]  . Lung cancer Mother     History   Social History  . Marital Status: Legally Separated    Spouse Name: N/A    Number of Children: N/A  . Years of Education: N/A   Occupational History  . Disabled    Social History Main Topics  . Smoking status: Former Smoker    Quit date: 07/20/1998  . Smokeless tobacco: Never Used  . Alcohol Use: No  . Drug Use: No  . Sexual Activity: Not on file   Other Topics Concern  . Not on file   Social History Narrative   Lives with best friend.     Past Surgical History  Procedure Laterality Date  . Cardiac surgery  1998    LIMA-LAD, SVG-OM, SVG-RCA  . Coronary angioplasty with stent placement  11/2011, 02/2012    DES to SVG-RCA both times, In [place], [place], Dr. Bruce Donath  . Cervical spine surgery  1991 and again several years later  . Knee arthroscopy Right 1998  . Finger surgery Right     ring finger  . Orif humerus fracture Left 08/27/2013    Procedure: OPEN REDUCTION INTERNAL FIXATION (ORIF) LEFT HUMERUS ;  Surgeon: Rozanna Box, MD;  Location:  [facility];  Service: Orthopedics;  Laterality: Left;  . Cholecystectomy  09/17/2013    dr [name]  . Cholecystectomy N/A 09/16/2013    Procedure: LAPAROSCOPIC CHOLECYSTECTOMY CONVERTED TO OPEN CHOLECYSTECTOMY WITH CHOLANGIOGRAM;  Surgeon: [name], MD;  Location: [facility];  Service: General;  Laterality: N/A;  . Ercp N/A 09/19/2013    Procedure: ENDOSCOPIC RETROGRADE CHOLANGIOPANCREATOGRAPHY (ERCP);  Surgeon: [name], MD;  Location: [facility];  Service: Endoscopy;  Laterality: N/A;  . I and d right subcostal wound  10/17/13  . Irrigation and debridement abscess Right 10/17/2013    Procedure: INCISION AND DRAINAGE RIGHT SUBCOSTAL WOUND;  Surgeon: [name], MD;  Location: [facility];  Service: General;  Laterality: Right;    Allergies    Allergen Reactions  . Benadryl [Diphenhydramine] Shortness Of Breath  . Adhesive [Tape]     Burning of skin  . Amoxicillin Nausea And Vomiting  . Flexeril [Cyclobenzaprine] Other (See Comments)    States that it messes with her blood circulation; pt states that she has to walk around   Hospital Medications:  . antiseptic oral rinse  7 mL Mouth Rinse q12n4p  . aspirin EC  81 mg Oral Daily  . chlorhexidine  15 mL Mouth Rinse BID  . clopidogrel  75 mg Oral Daily  . feeding supplement (RESOURCE BREEZE)  1 Container Oral BID BM  . fluticasone  2 puff Inhalation BID  . metoprolol  50 mg Oral BID  . mometasone-formoterol  2 puff Inhalation BID  . pantoprazole  40 mg Oral Daily  . piperacillin-tazobactam (ZOSYN)  IV  3.375 g Intravenous Q8H  . piperacillin-tazobactam  3.375 g Intravenous Once  . saccharomyces boulardii  250 mg Oral BID  . sodium chloride  1,000 mL Intravenous Once  . tiotropium  18 mcg Inhalation Daily      Physical Exam: Blood pressure 117/57, pulse 63, temperature 97.9 F (36.6 C), temperature source Oral, resp. rate 16, height 5\' 2"  (1.575 m), weight 183 lb 3.2 oz (83.099 kg), SpO2 99.00%.    General: Well developed, well nourished, NAD  HEENT: OP clear, mucus membranes moist  SKIN: warm, dry. No rashes.  Neuro: No focal deficits  Musculoskeletal: Muscle strength 5/5 all ext  Psychiatric: Mood and affect normal  Neck: No JVD, no carotid bruits, no thyromegaly, no lymphadenopathy.  Lungs:Clear bilaterally, no wheezes, rhonci, crackles  Cardiovascular: Regular rate and rhythm. No murmurs, gallops or rubs.  Abdomen:Soft. Bowel sounds present. Non-tender.  Extremities: No lower extremity edema. Pulses are 2 + in the bilateral DP/PT.   Labs:   Lab Results  Component Value Date   WBC 5.6 10/18/2013   HGB 10.4* 10/18/2013   HCT 33.5* 10/18/2013   MCV 86.3 10/18/2013   PLT 269 10/18/2013     Recent Labs Lab 10/17/13 1527 10/18/13 0530  NA 135* 134*  K 3.7  3.9  CL 90* 93*  CO2 26 25  BUN 22 22  CREATININE 0.90 0.95  CALCIUM 10.2 9.5  PROT 8.4*  --   BILITOT 0.4  --   ALKPHOS 102  --   ALT 14  --   AST 25  --   GLUCOSE 105* 179*   Cardiac cath Jul 19, 2013: Coronary Anatomy:  Left Main: Large caliber vessel that bifurcates distally into the LAD & Circumflex. Angiographically normal. LAD: Moderate-Large caliber vessel with mild diffuse luminal irregularities, the vessel reaches down to the apex it gives off 2 diagonal branches both of which bifurcate.  One is proximal, the other is in the mid LAD. There is a defined string sign him what was the LIMA graft that is likely atretic. The LAD has no real significant disease.  D1: Small-caliber vessel with a mid 60-70% stenosis. Not PCI amenable.  D2: Small to moderate caliber vessel that bifurcates. Minimal luminal irregularities.  LIMA-LAD: Not visualized using subclavian angiography. There is a slight string sign at what appears to be the ostium. Likely occluded. Left Subclavian Artery: Ostial roughly 60% stenosis Left Circumflex: Moderate caliber vessel that terminates in the AV groove. Diffuse distal disease.  SVG-OM1: Widely patent graft to a trifurcating distal circumflex/OM with 3 branches. The branches are small in diameter but without significant disease. There is a patent stent in the distal portion of the graft. RCA: Small caliber vessel it is chronically occluded in the mid vessel. The upstream vessels very small.  SVG-distal RCA: Antegrade flow fills a moderate caliber PDA as well as Posterior Atrioventricular Groove Vessel branch RPAV). Retrograde flow perfuses up to the mid RCA.  RPDA: Moderate caliber vessel, no significant disease  RPL Sysytem:The RPAV is a moderate caliber vessel that gives off 3 small posterior lateral branches.    EKG: 10/17/13: Sinus. Non-specific inferior ST changes.   ASSESSMENT:   1. CAD s/p CABG with most recent cardiac cath May 2015 with stable CAD, 2/3  patent grafts 2. Chest pain with atypical features 3. HTN 4. Hyperlipidemia 5. GERD  Plan:  I do not think her chest pain is cardiac related. She has had a recent cardiac cath 3 months ago with stable disease. EKG on admission without ischemic changes. I do not think ischemic testing is indicated at this time.   Her LV function is well preserved by echo.  She has some diffuse irregularities that are not amenable to PCI.  Continue medical therapy at this time.  Will sign off.  Call for questions.  Thayer Headings, Brooke Bonito., MD, [facility] 10/22/2013, 8:39 AM [address],  [place] Pager [phone]

## 2013-10-24 ENCOUNTER — Telehealth (INDEPENDENT_AMBULATORY_CARE_PROVIDER_SITE_OTHER): Payer: Self-pay

## 2013-10-24 DIAGNOSIS — T814XXS Infection following a procedure, sequela: Principal | ICD-10-CM

## 2013-10-24 DIAGNOSIS — IMO0001 Reserved for inherently not codable concepts without codable children: Secondary | ICD-10-CM

## 2013-10-24 NOTE — Telephone Encounter (Signed)
Pt called in stating the percocet 5/325 is not helping her pain at all. She is taking 2 every 4 hours and her pain level is still around an 8. She is requesting something stronger. Advised we would send request to Dr [name] and call her back. I let pt know he is unavailable this afternoon so it might be tomorrow before she hears back from [place]. Pt understanding and is fine with this.

## 2013-10-24 NOTE — Telephone Encounter (Signed)
If someone can, write for her percocet 7.5 mg 1 to 2 po q 6 hrs prn #40.  She needs to take ibuprofen and use an ice pack also.  If her pain remains around an 8, she needs to go to the ER.  She just got discharged the day before yesterday

## 2013-10-25 MED ORDER — OXYCODONE-ACETAMINOPHEN 7.5-325 MG PO TABS: 1.0000 | ORAL_TABLET | ORAL | Status: DC | PRN

## 2013-10-25 NOTE — Addendum Note (Signed)
Addended by: Illene Regulus on: 10/25/2013 09:01 AM   Modules accepted: Orders

## 2013-10-25 NOTE — Telephone Encounter (Signed)
LMOM notifying her that there is a written pain rx for Oxycodone 7.5/325mg  #40 that is stronger than last rx signed by Dr [name]. The rx is ready at front desk for p/u. I did leave in the message that if the pt is still experiencing pain like yesterday at pain level of 8 pt needs to go to the ER per Dr [name].

## 2013-10-29 ENCOUNTER — Other Ambulatory Visit (INDEPENDENT_AMBULATORY_CARE_PROVIDER_SITE_OTHER): Payer: Self-pay | Admitting: Surgery

## 2013-10-29 ENCOUNTER — Telehealth (INDEPENDENT_AMBULATORY_CARE_PROVIDER_SITE_OTHER): Payer: Self-pay

## 2013-10-29 MED ORDER — PROMETHAZINE HCL 12.5 MG PO TABS: 12.5000 mg | ORAL_TABLET | Freq: Four times a day (QID) | ORAL | Status: DC | PRN

## 2013-10-29 NOTE — Telephone Encounter (Signed)
Dr [name] e prescribed the Phenergan rx.

## 2013-10-29 NOTE — Telephone Encounter (Signed)
Pt s/p open chole on 09/16/13 by Dr [name]. Pt is calling to see if Dr [name] would call her in some Phenergan. Pt states that she has been taking Zofran with no relief. Pt states that she has continued to have nausea. Pt states that she has been able to keep some foods down. Advised pt that I would send this message to Dr [name] and we will contact her as soon as we receive a response. Pt verbalized understanding

## 2013-11-04 ENCOUNTER — Encounter (INDEPENDENT_AMBULATORY_CARE_PROVIDER_SITE_OTHER): Payer: Self-pay | Admitting: Surgery

## 2013-11-04 ENCOUNTER — Ambulatory Visit (INDEPENDENT_AMBULATORY_CARE_PROVIDER_SITE_OTHER): Payer: Medicare Other | Admitting: Surgery

## 2013-11-04 VITALS — BP 112/74 | HR 70 | Resp 18 | Ht 62.0 in | Wt 174.0 lb

## 2013-11-04 DIAGNOSIS — T889XXS Complication of surgical and medical care, unspecified, sequela: Secondary | ICD-10-CM

## 2013-11-04 DIAGNOSIS — Z09 Encounter for follow-up examination after completed treatment for conditions other than malignant neoplasm: Secondary | ICD-10-CM

## 2013-11-04 DIAGNOSIS — T814XXS Infection following a procedure, sequela: Secondary | ICD-10-CM

## 2013-11-04 DIAGNOSIS — IMO0001 Reserved for inherently not codable concepts without codable children: Secondary | ICD-10-CM

## 2013-11-04 MED ORDER — PROMETHAZINE HCL 12.5 MG PO TABS: 12.5000 mg | ORAL_TABLET | Freq: Four times a day (QID) | ORAL | Status: DC | PRN

## 2013-11-04 MED ORDER — OXYCODONE-ACETAMINOPHEN 7.5-325 MG PO TABS: 1.0000 | ORAL_TABLET | ORAL | Status: DC | PRN

## 2013-11-04 NOTE — Progress Notes (Signed)
Subjective:     Patient ID: Wendy [name], female   DOB: 1951-01-04, 63 y.o.   MRN: [id number]  HPI She is here for another postoperative visit. Since I saw her, she was remitted to the hospital with a wound infection and had to have the wound washed out in the operating room. She is now undergoing wet-to-dry dressing changes with pulmonology home. She still feels weak but has no affective symptoms or fevers.  Review of Systems     Objective:   Physical Exam On exam, her abdomen is soft and nontender. Her right upper quadrant open incision is healing well with excellent granulation tissue    Assessment:     Patient stable postop     Plan:     I will renew her Phenergan and Percocet. We will continue the current wound care. I will see her back in 4 weeks

## 2013-11-07 ENCOUNTER — Encounter: Payer: Self-pay | Admitting: Surgery

## 2013-11-27 ENCOUNTER — Telehealth (INDEPENDENT_AMBULATORY_CARE_PROVIDER_SITE_OTHER): Payer: Self-pay

## 2013-11-27 NOTE — Telephone Encounter (Signed)
Called pt to inform her that Rx for Percocet 7.5/325 was ready for her to pick up at the front desk. Pt verbalized understanding

## 2013-11-27 NOTE — Telephone Encounter (Signed)
I think she has pain issues.  She can have one more refill of 7.5/325 #40  1 to 2 po q 6hrs prn.  Someone there will have to write it.  Thank you

## 2013-11-27 NOTE — Telephone Encounter (Signed)
Pt s/p open chole with complications 5/69/79 by Dr. [name].Pt called to request refill on her Oxycodone 7.5/325mg .  She was seen in urgent clinic yesterday.  She rated pain 7 or 8 out of 10. Informed pt that we will send a message to Dr [name] and we will contact her as soon as we recieve a message. Pt verbalized understanding

## 2013-12-20 ENCOUNTER — Other Ambulatory Visit: Payer: Self-pay

## 2014-02-13 ENCOUNTER — Encounter (HOSPITAL_COMMUNITY): Payer: Self-pay | Admitting: Cardiology

## 2014-05-08 ENCOUNTER — Other Ambulatory Visit (INDEPENDENT_AMBULATORY_CARE_PROVIDER_SITE_OTHER): Payer: Self-pay

## 2014-05-08 DIAGNOSIS — R109 Unspecified abdominal pain: Secondary | ICD-10-CM

## 2014-05-08 DIAGNOSIS — K432 Incisional hernia without obstruction or gangrene: Secondary | ICD-10-CM

## 2014-05-11 ENCOUNTER — Inpatient Hospital Stay (HOSPITAL_COMMUNITY)
Admission: EM | Admit: 2014-05-11 | Discharge: 2014-05-12 | DRG: 303 | Disposition: A | Discharge status: Home / Self Care | Payer: Medicare Other | Attending: Internal Medicine | Admitting: Internal Medicine

## 2014-05-11 DIAGNOSIS — E876 Hypokalemia: Secondary | ICD-10-CM | POA: Diagnosis present

## 2014-05-11 DIAGNOSIS — R079 Chest pain, unspecified: Secondary | ICD-10-CM

## 2014-05-11 DIAGNOSIS — I25110 Atherosclerotic heart disease of native coronary artery with unstable angina pectoris: Principal | ICD-10-CM | POA: Diagnosis present

## 2014-05-11 DIAGNOSIS — Z7902 Long term (current) use of antithrombotics/antiplatelets: Secondary | ICD-10-CM

## 2014-05-11 DIAGNOSIS — I252 Old myocardial infarction: Secondary | ICD-10-CM

## 2014-05-11 DIAGNOSIS — G8929 Other chronic pain: Secondary | ICD-10-CM | POA: Diagnosis present

## 2014-05-11 DIAGNOSIS — I739 Peripheral vascular disease, unspecified: Secondary | ICD-10-CM | POA: Diagnosis present

## 2014-05-11 DIAGNOSIS — R1013 Epigastric pain: Secondary | ICD-10-CM

## 2014-05-11 DIAGNOSIS — F419 Anxiety disorder, unspecified: Secondary | ICD-10-CM | POA: Diagnosis present

## 2014-05-11 DIAGNOSIS — Z87891 Personal history of nicotine dependence: Secondary | ICD-10-CM

## 2014-05-11 DIAGNOSIS — M549 Dorsalgia, unspecified: Secondary | ICD-10-CM | POA: Diagnosis present

## 2014-05-11 DIAGNOSIS — J449 Chronic obstructive pulmonary disease, unspecified: Secondary | ICD-10-CM | POA: Diagnosis present

## 2014-05-11 DIAGNOSIS — E785 Hyperlipidemia, unspecified: Secondary | ICD-10-CM | POA: Diagnosis present

## 2014-05-11 DIAGNOSIS — Z7982 Long term (current) use of aspirin: Secondary | ICD-10-CM

## 2014-05-11 DIAGNOSIS — I200 Unstable angina: Secondary | ICD-10-CM | POA: Diagnosis present

## 2014-05-11 DIAGNOSIS — G4730 Sleep apnea, unspecified: Secondary | ICD-10-CM | POA: Diagnosis present

## 2014-05-11 DIAGNOSIS — Z951 Presence of aortocoronary bypass graft: Secondary | ICD-10-CM | POA: Diagnosis present

## 2014-05-11 DIAGNOSIS — K219 Gastro-esophageal reflux disease without esophagitis: Secondary | ICD-10-CM | POA: Diagnosis present

## 2014-05-11 DIAGNOSIS — I209 Angina pectoris, unspecified: Secondary | ICD-10-CM | POA: Diagnosis present

## 2014-05-11 DIAGNOSIS — G629 Polyneuropathy, unspecified: Secondary | ICD-10-CM | POA: Diagnosis present

## 2014-05-11 DIAGNOSIS — I10 Essential (primary) hypertension: Secondary | ICD-10-CM | POA: Diagnosis present

## 2014-05-11 NOTE — ED Provider Notes (Signed)
CSN: [id number]     Arrival date & time 05/11/14  2325 History   First MD Initiated Contact with Patient 05/11/14 2327     This chart was scribed for Merryl Hacker, MD by Forrestine Him, ED Scribe. This patient was seen in room A04C/A04C and the patient's care was started 11:34 PM.   Chief Complaint  Patient presents with  . Chest Pain   HPI  HPI Comments: CUMA POLYAKOV brought in by EMS is a 64 y.o. female with a PMHx of HTN, PAD, unstable angina who presents to the Emergency Department complaining of intermittent chest pain x few weeks. Pt states symptoms come a few times a week. Most recent episode this evening while being exerted described as "going into my back". Pt took 1 nitroglycerin tablet along with 4 baby aspirin which improved discomfort prior to arrival. She admits to 1 episode of vomiting prior to EMS arrival after taking a second Nitroglycerin tablet. En route to ED, pt was given an additional 3 tablets of nitroglycerin. Currently she is pain free. All other medications taken this evening as prescribed and directed. No recent travel. No leg swelling. PSHx includes CABG performed here at [place], Coronary angioplasty with stent placement 2013, and Left heart catheterization with coronary/graft angiogram 07/2013. Ms. Lenart has a CAT scan scheduled 3/9 to rule out a possible hernia.  And reports conversations related to open cholecystectomy last fall. She reports intermittent abdominal pain but states that the chest pain and abdominal pain are unrelated to each other. Pt with known allergies to Benadryl and Flexeril.  Patient reports the cardiologist just recently transferred practices in [place] and she's been unable to see him.  Past Medical History  Diagnosis Date  . Glucose intolerance (impaired glucose tolerance)   . Hypertension   . PAD (peripheral artery disease)   . COPD (chronic obstructive pulmonary disease)   . Peripheral neuropathy   . Chronic back pain   .  Chronic leg pain   . CAD (coronary artery disease), native coronary artery     Hx CABG 1998, last PCI 2013; LHC 07/2013:  EF 55-60%, L-LAD prob atretic, no sig disease in LAD, S-OM1 ok with patent stent, S-dRCA ok => med Rx.  Marland Kitchen HLD (hyperlipidemia)   . Myocardial infarction   . Sleep apnea   . Shortness of breath     when walking  . Anxiety   . GERD (gastroesophageal reflux disease)    Past Surgical History  Procedure Laterality Date  . Cardiac surgery  1998    LIMA-LAD, SVG-OM, SVG-RCA  . Coronary angioplasty with stent placement  11/2011, 02/2012    DES to SVG-RCA both times, In [place], [place], Dr. Bruce Donath  . Cervical spine surgery  1991 and again several years later  . Knee arthroscopy Right 1998  . Finger surgery Right     ring finger  . Orif humerus fracture Left 08/27/2013    Procedure: OPEN REDUCTION INTERNAL FIXATION (ORIF) LEFT HUMERUS ;  Surgeon: Rozanna Box, MD;  Location: [facility];  Service: Orthopedics;  Laterality: Left;  . Cholecystectomy  09/17/2013    dr [name]  . Cholecystectomy N/A 09/16/2013    Procedure: LAPAROSCOPIC CHOLECYSTECTOMY CONVERTED TO OPEN CHOLECYSTECTOMY WITH CHOLANGIOGRAM;  Surgeon: [name], MD;  Location: [facility];  Service: General;  Laterality: N/A;  . Ercp N/A 09/19/2013    Procedure: ENDOSCOPIC RETROGRADE CHOLANGIOPANCREATOGRAPHY (ERCP);  Surgeon: [name], MD;  Location: [facility];  Service: Endoscopy;  Laterality:  N/A;  . I and d right subcostal wound  10/17/13  . Irrigation and debridement abscess Right 10/17/2013    Procedure: INCISION AND DRAINAGE RIGHT SUBCOSTAL WOUND;  Surgeon: [name], MD;  Location: [facility];  Service: General;  Laterality: Right;  . Left heart catheterization with coronary/graft angiogram N/A 07/19/2013    Procedure: LEFT HEART CATHETERIZATION WITH Beatrix Fetters;  Surgeon: Leonie Man, MD;  Location: [facility] CATH [facility];  Service: Cardiovascular;  Laterality: N/A;   Family History  Problem  Relation Age of Onset  . Lung cancer Mother    History  Substance Use Topics  . Smoking status: Former Smoker    Quit date: 07/20/1998  . Smokeless tobacco: Never Used  . Alcohol Use: No   OB History    No data available     Review of Systems  Constitutional: Negative for fever.  Respiratory: Negative for cough, chest tightness and shortness of breath.   Cardiovascular: Positive for chest pain. Negative for leg swelling.  Gastrointestinal: Positive for vomiting and abdominal pain. Negative for nausea.  Genitourinary: Negative for dysuria.  Musculoskeletal: Negative for back pain.  Skin: Negative for rash.  Neurological: Negative for headaches.  Psychiatric/Behavioral: Negative for confusion.  All other systems reviewed and are negative.     Allergies  Benadryl; Adhesive; and Flexeril  Home Medications   Prior to Admission medications   Medication Sig Start Date End Date Taking? Authorizing Provider  Aclidinium Bromide (TUDORZA PRESSAIR) 400 MCG/ACT AEPB Inhale 1 puff into the lungs daily as needed (shorntess of breath).    Yes Historical Provider, MD  albuterol (PROVENTIL HFA;VENTOLIN HFA) 108 (90 BASE) MCG/ACT inhaler Inhale 2 puffs into the lungs every 6 (six) hours as needed for wheezing or shortness of breath.   Yes Historical Provider, MD  ALPRAZolam Duanne Moron) 0.5 MG tablet Take 1 mg by mouth at bedtime as needed for anxiety.   Yes Historical Provider, MD  aspirin EC 81 MG tablet Take 324 mg by mouth daily as needed (chest pain).    Yes Historical Provider, MD  beclomethasone (QVAR) 80 MCG/ACT inhaler Inhale 2 puffs into the lungs 2 (two) times daily as needed (shortness of breath).    Yes Historical Provider, MD  cetirizine (ZYRTEC) 10 MG tablet Take 10 mg by mouth daily.   Yes Historical Provider, MD  clopidogrel (PLAVIX) 75 MG tablet Take 75 mg by mouth daily.   Yes Historical Provider, MD  ezetimibe (ZETIA) 10 MG tablet Take 10 mg by mouth daily.   Yes Historical  Provider, MD  Fish Oil-Cholecalciferol (FISH OIL + D3 PO) Take 1 capsule by mouth daily.   Yes Historical Provider, MD  Fluticasone-Salmeterol (ADVAIR) 250-50 MCG/DOSE AEPB Inhale 1 puff into the lungs 2 (two) times daily as needed (shortness of breath).    Yes Historical Provider, MD  isosorbide mononitrate (IMDUR) 120 MG 24 hr tablet Take 120 mg by mouth daily.   Yes Historical Provider, MD  metoprolol (LOPRESSOR) 100 MG tablet Take 1 tablet (100 mg total) by mouth 2 (two) times daily. 09/22/13  Yes Ripudeep Krystal Eaton, MD  mometasone-formoterol (DULERA) 100-5 MCG/ACT AERO Inhale 2 puffs into the lungs 2 (two) times daily as needed for wheezing.   Yes Historical Provider, MD  nitroGLYCERIN (NITROSTAT) 0.4 MG SL tablet Place 0.4 mg under the tongue every 5 (five) minutes as needed for chest pain.   Yes Historical Provider, MD  omeprazole (PRILOSEC) 40 MG capsule Take 40 mg by mouth 2 (two) times  daily.   Yes Historical Provider, MD  oxyCODONE-acetaminophen (PERCOCET/ROXICET) 5-325 MG per tablet Take 1-2 tablets by mouth every 6 (six) hours as needed for severe pain. 10/22/13  Yes Saverio Danker, PA-C  promethazine (PHENERGAN) 12.5 MG tablet Take 1-2 tablets (12.5-25 mg total) by mouth every 6 (six) hours as needed for nausea. 11/04/13  Yes Coralie Keens, MD  sertraline (ZOLOFT) 25 MG tablet Take 25 mg by mouth daily.   Yes Historical Provider, MD  simvastatin (ZOCOR) 20 MG tablet Take 20 mg by mouth daily.   Yes Historical Provider, MD  triamterene-hydrochlorothiazide (MAXZIDE-25) 37.5-25 MG per tablet Take 1 tablet by mouth daily.   Yes Historical Provider, MD  atorvastatin (LIPITOR) 10 MG tablet Take 1 tablet (10 mg total) by mouth at bedtime. Patient not taking: Reported on 05/12/2014 09/21/13   Ripudeep Krystal Eaton, MD  Cyanocobalamin 1000 MCG/ML KIT Inject 1,000 mcg as directed every 30 (thirty) days. 15th of each month    Historical Provider, MD  docusate sodium 100 MG CAPS Take 100 mg by mouth 2 (two) times  daily. Patient not taking: Reported on 05/12/2014 08/28/13   Jari Pigg, PA-C  ondansetron (ZOFRAN ODT) 4 MG disintegrating tablet Take 1 tablet (4 mg total) by mouth every 8 (eight) hours as needed for nausea or vomiting. Patient not taking: Reported on 05/12/2014 10/22/13   Saverio Danker, PA-C  oxyCODONE-acetaminophen (PERCOCET) 7.5-325 MG per tablet Take 1-2 tablets by mouth every 4 (four) hours as needed for pain. Patient not taking: Reported on 05/12/2014 11/04/13 11/04/14  Coralie Keens, MD  sulfamethoxazole-trimethoprim (BACTRIM DS) 800-160 MG per tablet Take 1 tablet by mouth 2 (two) times daily. Patient not taking: Reported on 05/12/2014 10/22/13   Saverio Danker, PA-C   Triage Vitals: BP 100/56 mmHg  Pulse 74  Temp(Src) 99.2 F (37.3 C) (Oral)  Resp 21  Ht _0  (1.575 m)  Wt 180 lb (81.647 kg)  BMI 32.91 kg/m2  SpO2 93%   Physical Exam  Constitutional: She is oriented to person, place, and time. She appears well-developed and well-nourished.  Morbidly obese  HENT:  Head: Normocephalic and atraumatic.  Eyes: Pupils are equal, round, and reactive to light.  Neck: Neck supple. No JVD present.  Cardiovascular: Normal rate, regular rhythm and normal heart sounds.   No murmur heard. Midline sternotomy scar  Pulmonary/Chest: Effort normal. No respiratory distress. She has no wheezes.  Distant but clear breath sounds  Abdominal: Soft. Bowel sounds are normal. There is tenderness.  Mild tenderness to palpation of the epigastrium and right upper quadrant, right upper quadrant abdominal scarring, no hernia noted, no rebound or guarding  Musculoskeletal: She exhibits edema.  Neurological: She is alert and oriented to person, place, and time.  Skin: Skin is warm and dry.  Psychiatric: She has a normal mood and affect.  Nursing note and vitals reviewed.   ED Course  Procedures (including critical care time)  DIAGNOSTIC STUDIES: Oxygen Saturation is 95% on RA, adequate by my  interpretation.    COORDINATION OF CARE: 11:47 PM-Discussed treatment plan with pt at bedside and pt agreed to plan.     Labs Review Labs Reviewed  CBC - Abnormal; Notable for the following:    Hemoglobin 10.9 (*)    HCT 34.5 (*)    RDW 17.2 (*)    All other components within normal limits  LIPASE, BLOOD - Abnormal; Notable for the following:    Lipase 60 (*)    All other components within normal  limits  COMPREHENSIVE METABOLIC PANEL - Abnormal; Notable for the following:    Potassium 2.9 (*)    Glucose, Bld 136 (*)    AST 71 (*)    ALT 52 (*)    GFR calc non Af Amer 59 (*)    GFR calc Af Amer 68 (*)    All other components within normal limits  BRAIN NATRIURETIC PEPTIDE  I-STAT TROPOININ, ED    Imaging Review Dg Chest Port 1 View  05/12/2014   CLINICAL DATA:  Chest pain tonight.  Shortness of breath.  EXAM: PORTABLE CHEST - 1 VIEW  COMPARISON:  08/26/2013  FINDINGS: Postoperative changes in the mediastinum and left shoulder. Normal heart size and pulmonary vascularity. No focal airspace disease or consolidation in the lungs. No blunting of costophrenic angles. No pneumothorax. Mediastinal contours appear intact.  IMPRESSION: No active disease.   Electronically Signed   By: Lucienne Capers M.D.   On: 05/12/2014 00:54     EKG Interpretation   Date/Time:  Sunday May 11 2014 23:42:12 EST Ventricular Rate:  80 PR Interval:  114 QRS Duration: 89 QT Interval:  443 QTC Calculation: 511 R Axis:   54 Text Interpretation:  Sinus rhythm Borderline short PR interval  Nonspecific T abnormalities, lateral leads Prolonged QT interval Baseline  wander in lead(s) V2 Prolonged QT when compared to prior Confirmed by  Kyron Schlitt  MD, [place] ([id number]) on 05/11/2014 11:55:46 PM      MDM   Final diagnoses:  Acute angina  Epigastric pain    Patient presents with recurrent chest pain suggestive of unstable angina. Extensive cardiac history including CABG. Most of her cardiac workup is done  at outside hospitals. No current stress test on file. Patient is currently pain-free status post aspirin and nitroglycerin. Patient also reports recent difficulties regarding persistent abdominal pain and cholecystectomy.  Lab work notable for mild elevation in LFTs and hypokalemia. Troponin is negative. EKG shows no evidence of acute ischemia and chest x-ray without active disease. Given improvement of pain with nitroglycerin, history, and description of pain, would be concerned for unstable angina. Discussed with cardiology who will consult on the patient and likely stress test tomorrow. Given concurrent abdominal complaints, favor admission to medicine for patient care. Hospitalist consulted.  I personally performed the services described in this documentation, which was scribed in my presence. The recorded information has been reviewed and is accurate.   Merryl Hacker, MD 05/12/14 [phone]

## 2014-05-11 NOTE — ED Notes (Signed)
Per EMS, pt from home c/o of 5/10 generalized, non-radiating intermittent CP x 1 week. Pt has hx of MI, CABG, and stent placement. Pt took 324 ASA and 1 Nitro at 1600 with relief. At 2200 CP came back and pt took 324 ASA and 3 Nitro. EMS gave pt 2 Nitro en route. NAD noted. VSS BP 123/67 98 RA HR 74.

## 2014-05-12 ENCOUNTER — Inpatient Hospital Stay (HOSPITAL_COMMUNITY): Payer: Medicare Other

## 2014-05-12 ENCOUNTER — Encounter (HOSPITAL_COMMUNITY): Payer: Self-pay | Admitting: *Deleted

## 2014-05-12 ENCOUNTER — Emergency Department (HOSPITAL_COMMUNITY): Payer: Medicare Other

## 2014-05-12 DIAGNOSIS — I25110 Atherosclerotic heart disease of native coronary artery with unstable angina pectoris: Principal | ICD-10-CM

## 2014-05-12 DIAGNOSIS — G8929 Other chronic pain: Secondary | ICD-10-CM | POA: Diagnosis present

## 2014-05-12 DIAGNOSIS — G629 Polyneuropathy, unspecified: Secondary | ICD-10-CM | POA: Diagnosis present

## 2014-05-12 DIAGNOSIS — I2510 Atherosclerotic heart disease of native coronary artery without angina pectoris: Secondary | ICD-10-CM

## 2014-05-12 DIAGNOSIS — I252 Old myocardial infarction: Secondary | ICD-10-CM | POA: Diagnosis not present

## 2014-05-12 DIAGNOSIS — I10 Essential (primary) hypertension: Secondary | ICD-10-CM | POA: Diagnosis present

## 2014-05-12 DIAGNOSIS — J449 Chronic obstructive pulmonary disease, unspecified: Secondary | ICD-10-CM | POA: Diagnosis present

## 2014-05-12 DIAGNOSIS — I739 Peripheral vascular disease, unspecified: Secondary | ICD-10-CM | POA: Diagnosis present

## 2014-05-12 DIAGNOSIS — Z951 Presence of aortocoronary bypass graft: Secondary | ICD-10-CM | POA: Diagnosis not present

## 2014-05-12 DIAGNOSIS — K219 Gastro-esophageal reflux disease without esophagitis: Secondary | ICD-10-CM | POA: Diagnosis present

## 2014-05-12 DIAGNOSIS — M549 Dorsalgia, unspecified: Secondary | ICD-10-CM | POA: Diagnosis present

## 2014-05-12 DIAGNOSIS — G4730 Sleep apnea, unspecified: Secondary | ICD-10-CM | POA: Diagnosis present

## 2014-05-12 DIAGNOSIS — R079 Chest pain, unspecified: Secondary | ICD-10-CM

## 2014-05-12 DIAGNOSIS — R0789 Other chest pain: Secondary | ICD-10-CM

## 2014-05-12 DIAGNOSIS — Z7982 Long term (current) use of aspirin: Secondary | ICD-10-CM | POA: Diagnosis not present

## 2014-05-12 DIAGNOSIS — I209 Angina pectoris, unspecified: Secondary | ICD-10-CM | POA: Diagnosis present

## 2014-05-12 DIAGNOSIS — Z7902 Long term (current) use of antithrombotics/antiplatelets: Secondary | ICD-10-CM | POA: Diagnosis not present

## 2014-05-12 DIAGNOSIS — Z87891 Personal history of nicotine dependence: Secondary | ICD-10-CM | POA: Diagnosis not present

## 2014-05-12 DIAGNOSIS — F419 Anxiety disorder, unspecified: Secondary | ICD-10-CM | POA: Diagnosis present

## 2014-05-12 DIAGNOSIS — E876 Hypokalemia: Secondary | ICD-10-CM | POA: Diagnosis present

## 2014-05-12 DIAGNOSIS — R1013 Epigastric pain: Secondary | ICD-10-CM | POA: Insufficient documentation

## 2014-05-12 DIAGNOSIS — E785 Hyperlipidemia, unspecified: Secondary | ICD-10-CM | POA: Diagnosis present

## 2014-05-12 DIAGNOSIS — I200 Unstable angina: Secondary | ICD-10-CM

## 2014-05-12 DIAGNOSIS — I25118 Atherosclerotic heart disease of native coronary artery with other forms of angina pectoris: Secondary | ICD-10-CM

## 2014-05-12 LAB — BASIC METABOLIC PANEL
Anion gap: 7 (ref 5–15)
Anion gap: 7 (ref 5–15)
BUN: 17 mg/dL (ref 6–23)
BUN: 15 mg/dL (ref 6–23)
CO2: 29 mmol/L (ref 19–32)
CO2: 30 mmol/L (ref 19–32)
Calcium: 8.8 mg/dL (ref 8.4–10.5)
Calcium: 9.1 mg/dL (ref 8.4–10.5)
Chloride: 101 mmol/L (ref 96–112)
Chloride: 101 mmol/L (ref 96–112)
Creatinine, Ser: 0.79 mg/dL (ref 0.50–1.10)
Creatinine, Ser: 1.06 mg/dL (ref 0.50–1.10)
GFR calc Af Amer: >90 mL/min (ref >90)
GFR calc Af Amer: 64 mL/min — ABNORMAL LOW (ref >90)
GFR calc non Af Amer: 87 mL/min — ABNORMAL LOW (ref >90)
GFR calc non Af Amer: 55 mL/min — ABNORMAL LOW (ref >90)
Glucose, Bld: 104 mg/dL — ABNORMAL HIGH (ref 70–99)
Glucose, Bld: 107 mg/dL — ABNORMAL HIGH (ref 70–99)
Potassium: 2.9 mmol/L — ABNORMAL LOW (ref 3.5–5.1)
Potassium: 3.9 mmol/L (ref 3.5–5.1)
Sodium: 137 mmol/L (ref 135–145)
Sodium: 138 mmol/L (ref 135–145)

## 2014-05-12 LAB — CBC
HCT: 34.4 % — ABNORMAL LOW (ref 36.0–46.0)
HCT: 34.5 % — ABNORMAL LOW (ref 36.0–46.0)
Hemoglobin: 10.8 g/dL — ABNORMAL LOW (ref 12.0–15.0)
Hemoglobin: 10.9 g/dL — ABNORMAL LOW (ref 12.0–15.0)
MCH: 26.6 pg (ref 26.0–34.0)
MCH: 27.3 pg (ref 26.0–34.0)
MCHC: 31.4 g/dL (ref 30.0–36.0)
MCHC: 31.6 g/dL (ref 30.0–36.0)
MCV: 84.7 fL (ref 78.0–100.0)
MCV: 86.3 fL (ref 78.0–100.0)
Platelets: 176 10*3/uL (ref 150–400)
Platelets: 185 10*3/uL (ref 150–400)
RBC: 4 MIL/uL (ref 3.87–5.11)
RBC: 4.06 MIL/uL (ref 3.87–5.11)
RDW: 17.2 % — ABNORMAL HIGH (ref 11.5–15.5)
RDW: 17.3 % — ABNORMAL HIGH (ref 11.5–15.5)
WBC: 6.9 10*3/uL (ref 4.0–10.5)
WBC: 7.8 10*3/uL (ref 4.0–10.5)

## 2014-05-12 LAB — COMPREHENSIVE METABOLIC PANEL
ALT: 52 U/L — ABNORMAL HIGH (ref 0–35)
AST: 71 U/L — ABNORMAL HIGH (ref 0–37)
Albumin: 3.5 g/dL (ref 3.5–5.2)
Alkaline Phosphatase: 93 U/L (ref 39–117)
Anion gap: 11 (ref 5–15)
BUN: 21 mg/dL (ref 6–23)
CO2: 28 mmol/L (ref 19–32)
Calcium: 8.8 mg/dL (ref 8.4–10.5)
Chloride: 100 mmol/L (ref 96–112)
Creatinine, Ser: 1 mg/dL (ref 0.50–1.10)
GFR calc Af Amer: 68 mL/min — ABNORMAL LOW (ref >90)
GFR calc non Af Amer: 59 mL/min — ABNORMAL LOW (ref >90)
Glucose, Bld: 136 mg/dL — ABNORMAL HIGH (ref 70–99)
Potassium: 2.9 mmol/L — ABNORMAL LOW (ref 3.5–5.1)
Sodium: 139 mmol/L (ref 135–145)
Total Bilirubin: 0.7 mg/dL (ref 0.3–1.2)
Total Protein: 7.4 g/dL (ref 6.0–8.3)

## 2014-05-12 LAB — TROPONIN I
Troponin I: <0.03 ng/mL (ref <0.031)
Troponin I: <0.03 ng/mL (ref <0.031)
Troponin I: <0.03 ng/mL (ref <0.031)

## 2014-05-12 LAB — BRAIN NATRIURETIC PEPTIDE: B Natriuretic Peptide: 72.7 pg/mL (ref 0.0–100.0)

## 2014-05-12 LAB — MAGNESIUM: Magnesium: 1.8 mg/dL (ref 1.5–2.5)

## 2014-05-12 LAB — I-STAT TROPONIN, ED: Troponin i, poc: 0 ng/mL (ref 0.00–0.08)

## 2014-05-12 LAB — LIPASE, BLOOD: Lipase: 60 U/L — ABNORMAL HIGH (ref 11–59)

## 2014-05-12 MED ORDER — METOPROLOL TARTRATE 100 MG PO TABS
100.0000 mg | ORAL_TABLET | Freq: Two times a day (BID) | ORAL | Status: DC
  Administered 2014-05-12: 100 mg via ORAL
  Filled 2014-05-12 (×2): qty 1

## 2014-05-12 MED ORDER — POTASSIUM CHLORIDE CRYS ER 20 MEQ PO TBCR
40.0000 meq | EXTENDED_RELEASE_TABLET | Freq: Once | ORAL | Status: AC
  Administered 2014-05-12: 40 meq via ORAL
  Filled 2014-05-12: qty 2

## 2014-05-12 MED ORDER — SODIUM CHLORIDE 0.9 % IV SOLN: 250.0000 mL | INTRAVENOUS | Status: DC | PRN

## 2014-05-12 MED ORDER — NITROGLYCERIN 2 % TD OINT
0.5000 [in_us] | TOPICAL_OINTMENT | Freq: Four times a day (QID) | TRANSDERMAL | Status: DC
  Filled 2014-05-12 (×2): qty 30

## 2014-05-12 MED ORDER — ISOSORBIDE MONONITRATE ER 60 MG PO TB24
120.0000 mg | ORAL_TABLET | Freq: Every day | ORAL | Status: DC
  Administered 2014-05-12: 120 mg via ORAL
  Filled 2014-05-12: qty 2

## 2014-05-12 MED ORDER — OMEGA-3-ACID ETHYL ESTERS 1 G PO CAPS
1.0000 g | ORAL_CAPSULE | Freq: Every day | ORAL | Status: DC
  Administered 2014-05-12: 1 g via ORAL
  Filled 2014-05-12: qty 1

## 2014-05-12 MED ORDER — TECHNETIUM TC 99M SESTAMIBI GENERIC - CARDIOLITE
10.0000 | Freq: Once | INTRAVENOUS | Status: AC | PRN
  Administered 2014-05-12: 10 via INTRAVENOUS

## 2014-05-12 MED ORDER — REGADENOSON 0.4 MG/5ML IV SOLN
0.4000 mg | Freq: Once | INTRAVENOUS | Status: AC
  Administered 2014-05-12: 0.4 mg via INTRAVENOUS
  Filled 2014-05-12: qty 5

## 2014-05-12 MED ORDER — CLOPIDOGREL BISULFATE 75 MG PO TABS
75.0000 mg | ORAL_TABLET | Freq: Every day | ORAL | Status: DC
  Filled 2014-05-12: qty 1

## 2014-05-12 MED ORDER — VITAMIN D3 25 MCG (1000 UNIT) PO TABS
1000.0000 [IU] | ORAL_TABLET | Freq: Every day | ORAL | Status: DC
  Administered 2014-05-12: 1000 [IU] via ORAL
  Filled 2014-05-12: qty 1

## 2014-05-12 MED ORDER — ENOXAPARIN SODIUM 40 MG/0.4ML ~~LOC~~ SOLN
40.0000 mg | Freq: Every day | SUBCUTANEOUS | Status: DC
  Administered 2014-05-12: 40 mg via SUBCUTANEOUS
  Filled 2014-05-12: qty 0.4

## 2014-05-12 MED ORDER — POTASSIUM CHLORIDE CRYS ER 20 MEQ PO TBCR
40.0000 meq | EXTENDED_RELEASE_TABLET | Freq: Two times a day (BID) | ORAL | Status: DC
  Administered 2014-05-12: 40 meq via ORAL
  Filled 2014-05-12: qty 2

## 2014-05-12 MED ORDER — METOPROLOL TARTRATE 100 MG PO TABS
100.0000 mg | ORAL_TABLET | Freq: Two times a day (BID) | ORAL | Status: DC
  Filled 2014-05-12 (×2): qty 1

## 2014-05-12 MED ORDER — REGADENOSON 0.4 MG/5ML IV SOLN
INTRAVENOUS | Status: AC
  Filled 2014-05-12: qty 5

## 2014-05-12 MED ORDER — EZETIMIBE 10 MG PO TABS
10.0000 mg | ORAL_TABLET | Freq: Every day | ORAL | Status: DC
  Administered 2014-05-12: 10 mg via ORAL
  Filled 2014-05-12: qty 1

## 2014-05-12 MED ORDER — ACETAMINOPHEN 650 MG RE SUPP: 650.0000 mg | Freq: Four times a day (QID) | RECTAL | Status: DC | PRN

## 2014-05-12 MED ORDER — OXYCODONE HCL 5 MG PO TABS: 5.0000 mg | ORAL_TABLET | ORAL | Status: DC | PRN

## 2014-05-12 MED ORDER — ALPRAZOLAM 0.5 MG PO TABS: 1.0000 mg | ORAL_TABLET | Freq: Every evening | ORAL | Status: DC | PRN

## 2014-05-12 MED ORDER — NITROGLYCERIN 0.4 MG SL SUBL: 0.4000 mg | SUBLINGUAL_TABLET | SUBLINGUAL | Status: DC | PRN

## 2014-05-12 MED ORDER — SODIUM CHLORIDE 0.9 % IJ SOLN
3.0000 mL | Freq: Two times a day (BID) | INTRAMUSCULAR | Status: DC
  Administered 2014-05-12: 3 mL via INTRAVENOUS

## 2014-05-12 MED ORDER — ACETAMINOPHEN 325 MG PO TABS: 650.0000 mg | ORAL_TABLET | Freq: Four times a day (QID) | ORAL | Status: DC | PRN

## 2014-05-12 MED ORDER — PANTOPRAZOLE SODIUM 40 MG PO TBEC
40.0000 mg | DELAYED_RELEASE_TABLET | Freq: Every day | ORAL | Status: DC
  Administered 2014-05-12: 40 mg via ORAL
  Filled 2014-05-12: qty 1

## 2014-05-12 MED ORDER — TECHNETIUM TC 99M SESTAMIBI GENERIC - CARDIOLITE
30.0000 | Freq: Once | INTRAVENOUS | Status: AC | PRN
  Administered 2014-05-12: 30 via INTRAVENOUS

## 2014-05-12 MED ORDER — SERTRALINE HCL 25 MG PO TABS
25.0000 mg | ORAL_TABLET | Freq: Every day | ORAL | Status: DC
  Administered 2014-05-12: 25 mg via ORAL
  Filled 2014-05-12: qty 1

## 2014-05-12 MED ORDER — ALUM & MAG HYDROXIDE-SIMETH 200-200-20 MG/5ML PO SUSP: 30.0000 mL | Freq: Four times a day (QID) | ORAL | Status: DC | PRN

## 2014-05-12 MED ORDER — ASPIRIN 81 MG PO CHEW
81.0000 mg | CHEWABLE_TABLET | Freq: Every day | ORAL | Status: DC
  Administered 2014-05-12: 81 mg via ORAL
  Filled 2014-05-12: qty 1

## 2014-05-12 MED ORDER — LORATADINE 10 MG PO TABS
10.0000 mg | ORAL_TABLET | Freq: Every day | ORAL | Status: DC
  Administered 2014-05-12: 10 mg via ORAL
  Filled 2014-05-12: qty 1

## 2014-05-12 MED ORDER — FLUTICASONE PROPIONATE HFA 44 MCG/ACT IN AERO
2.0000 | INHALATION_SPRAY | Freq: Two times a day (BID) | RESPIRATORY_TRACT | Status: DC
  Administered 2014-05-12: 2 via RESPIRATORY_TRACT
  Filled 2014-05-12: qty 10.6

## 2014-05-12 MED ORDER — FISH OIL + D3 1200-1000 MG-UNIT PO CAPS: 1.0000 | ORAL_CAPSULE | Freq: Every day | ORAL | Status: DC

## 2014-05-12 MED ORDER — SODIUM CHLORIDE 0.9 % IJ SOLN: 3.0000 mL | INTRAMUSCULAR | Status: DC | PRN

## 2014-05-12 MED ORDER — ATORVASTATIN CALCIUM 80 MG PO TABS
80.0000 mg | ORAL_TABLET | Freq: Every day | ORAL | Status: DC
  Administered 2014-05-12: 80 mg via ORAL
  Filled 2014-05-12: qty 1

## 2014-05-12 MED ORDER — ONDANSETRON HCL 4 MG PO TABS: 4.0000 mg | ORAL_TABLET | Freq: Four times a day (QID) | ORAL | Status: DC | PRN

## 2014-05-12 MED ORDER — MORPHINE SULFATE 4 MG/ML IJ SOLN
4.0000 mg | Freq: Once | INTRAMUSCULAR | Status: DC
  Filled 2014-05-12: qty 1

## 2014-05-12 MED ORDER — HYDROMORPHONE HCL 1 MG/ML IJ SOLN: 0.5000 mg | INTRAMUSCULAR | Status: DC | PRN

## 2014-05-12 MED ORDER — ONDANSETRON HCL 4 MG/2ML IJ SOLN: 4.0000 mg | Freq: Four times a day (QID) | INTRAMUSCULAR | Status: DC | PRN

## 2014-05-12 NOTE — ED Notes (Signed)
X-ray at bedside

## 2014-05-12 NOTE — Progress Notes (Signed)
Patient's K+ was 3.9. MD Notified.  Discharge instructions reviewed with the patient as well as D/C meds and follow up appointments.  Patient voices understanding to teaching. To door via wheelchair.  Home via [place] with a friend driving.

## 2014-05-12 NOTE — Consult Note (Signed)
CARDIOLOGY CONSULT NOTE   Wendy [name] MRN: [id number] DOB/AGE: [birth year] 64 y.o. Admit date: 05/11/2014  Primary Cardiologist: [name]  Reason for consultation:  Chest pain  HPI:  64 yo with significant history of CAD s/p CABG, and PCIs who is s/p abdominal surgery last fall, presented to ER with chest pain for couple of weeks. She had another episode last night that was relived after total of 4 Nitro given. Then she started to have abdominal pain that per her is different from her chest pain.   Her last cath was 07/2013 which showed patient VGs to OM and dRCA, atretic LIMA but no sig disease in LAD.   ECG revealed no significant changes and Troponin x1 was negative.   Review of systems: A review of 10 organ systems was done and is negative except as stated above in HPI  Past Medical History  Diagnosis Date  . Glucose intolerance (impaired glucose tolerance)   . Hypertension   . PAD (peripheral artery disease)   . COPD (chronic obstructive pulmonary disease)   . Peripheral neuropathy   . Chronic back pain   . Chronic leg pain   . CAD (coronary artery disease), native coronary artery     Hx CABG 1998, last PCI 2013; LHC 07/2013:  EF 55-60%, L-LAD prob atretic, no sig disease in LAD, S-OM1 ok with patent stent, S-dRCA ok => med Rx.  Marland Kitchen HLD (hyperlipidemia)   . Myocardial infarction   . Sleep apnea   . Shortness of breath     when walking  . Anxiety   . GERD (gastroesophageal reflux disease)    Past Surgical History  Procedure Laterality Date  . Cardiac surgery  1998    LIMA-LAD, SVG-OM, SVG-RCA  . Coronary angioplasty with stent placement  11/2011, 02/2012    DES to SVG-RCA both times, In [place], [place], Dr. Bruce Donath  . Cervical spine surgery  1991 and again several years later  . Knee arthroscopy Right 1998  . Finger surgery Right     ring finger  . Orif humerus fracture Left 08/27/2013    Procedure: OPEN REDUCTION INTERNAL FIXATION (ORIF) LEFT HUMERUS ;  Surgeon:  Rozanna Box, MD;  Location: [facility];  Service: Orthopedics;  Laterality: Left;  . Cholecystectomy  09/17/2013    dr [name]  . Cholecystectomy N/A 09/16/2013    Procedure: LAPAROSCOPIC CHOLECYSTECTOMY CONVERTED TO OPEN CHOLECYSTECTOMY WITH CHOLANGIOGRAM;  Surgeon: [name], MD;  Location: [facility];  Service: General;  Laterality: N/A;  . Ercp N/A 09/19/2013    Procedure: ENDOSCOPIC RETROGRADE CHOLANGIOPANCREATOGRAPHY (ERCP);  Surgeon: [name], MD;  Location: [facility];  Service: Endoscopy;  Laterality: N/A;  . I and d right subcostal wound  10/17/13  . Irrigation and debridement abscess Right 10/17/2013    Procedure: INCISION AND DRAINAGE RIGHT SUBCOSTAL WOUND;  Surgeon: [name], MD;  Location: [facility];  Service: General;  Laterality: Right;  . Left heart catheterization with coronary/graft angiogram N/A 07/19/2013    Procedure: LEFT HEART CATHETERIZATION WITH Beatrix Fetters;  Surgeon: Leonie Man, MD;  Location: [facility] LAB;  Service: Cardiovascular;  Laterality: N/A;   History   Social History  . Marital Status: Legally Separated    Spouse Name: N/A  . Number of Children: N/A  . Years of Education: N/A   Occupational History  . Disabled    Social History Main Topics  . Smoking status: Former Smoker    Quit date: 07/20/1998  .  Smokeless tobacco: Never Used  . Alcohol Use: No  . Drug Use: No  . Sexual Activity: Not on file   Other Topics Concern  . Not on file   Social History Narrative   Lives with best friend.     Family History  Problem Relation Age of Onset  . Lung cancer Mother      Allergies  Allergen Reactions  . Benadryl [Diphenhydramine] Shortness Of Breath  . Adhesive [Tape]     Burning of skin  . Flexeril [Cyclobenzaprine] Other (See Comments)    States that it messes with her blood circulation; pt states that she has to walk around     (Not in a hospital admission)   Current facility-administered medications:  .   morphine 4 MG/ML injection 4 mg, 4 mg, Intravenous, Once, Merryl Hacker, MD, 4 mg at 05/12/14 1497  Current outpatient prescriptions:  .  Aclidinium Bromide (TUDORZA PRESSAIR) 400 MCG/ACT AEPB, Inhale 1 puff into the lungs daily as needed (shorntess of breath). , Disp: , Rfl:  .  albuterol (PROVENTIL HFA;VENTOLIN HFA) 108 (90 BASE) MCG/ACT inhaler, Inhale 2 puffs into the lungs every 6 (six) hours as needed for wheezing or shortness of breath., Disp: , Rfl:  .  ALPRAZolam (XANAX) 0.5 MG tablet, Take 1 mg by mouth at bedtime as needed for anxiety., Disp: , Rfl:  .  aspirin EC 81 MG tablet, Take 324 mg by mouth daily as needed (chest pain). , Disp: , Rfl:  .  beclomethasone (QVAR) 80 MCG/ACT inhaler, Inhale 2 puffs into the lungs 2 (two) times daily as needed (shortness of breath). , Disp: , Rfl:  .  cetirizine (ZYRTEC) 10 MG tablet, Take 10 mg by mouth daily., Disp: , Rfl:  .  clopidogrel (PLAVIX) 75 MG tablet, Take 75 mg by mouth daily., Disp: , Rfl:  .  ezetimibe (ZETIA) 10 MG tablet, Take 10 mg by mouth daily., Disp: , Rfl:  .  Fish Oil-Cholecalciferol (FISH OIL + D3 PO), Take 1 capsule by mouth daily., Disp: , Rfl:  .  Fluticasone-Salmeterol (ADVAIR) 250-50 MCG/DOSE AEPB, Inhale 1 puff into the lungs 2 (two) times daily as needed (shortness of breath). , Disp: , Rfl:  .  isosorbide mononitrate (IMDUR) 120 MG 24 hr tablet, Take 120 mg by mouth daily., Disp: , Rfl:  .  metoprolol (LOPRESSOR) 100 MG tablet, Take 1 tablet (100 mg total) by mouth 2 (two) times daily., Disp: , Rfl:  .  mometasone-formoterol (DULERA) 100-5 MCG/ACT AERO, Inhale 2 puffs into the lungs 2 (two) times daily as needed for wheezing., Disp: , Rfl:  .  nitroGLYCERIN (NITROSTAT) 0.4 MG SL tablet, Place 0.4 mg under the tongue every 5 (five) minutes as needed for chest pain., Disp: , Rfl:  .  omeprazole (PRILOSEC) 40 MG capsule, Take 40 mg by mouth 2 (two) times daily., Disp: , Rfl:  .  oxyCODONE-acetaminophen  (PERCOCET/ROXICET) 5-325 MG per tablet, Take 1-2 tablets by mouth every 6 (six) hours as needed for severe pain., Disp: 30 tablet, Rfl: 0 .  promethazine (PHENERGAN) 12.5 MG tablet, Take 1-2 tablets (12.5-25 mg total) by mouth every 6 (six) hours as needed for nausea., Disp: 20 tablet, Rfl: 3 .  sertraline (ZOLOFT) 25 MG tablet, Take 25 mg by mouth daily., Disp: , Rfl:  .  simvastatin (ZOCOR) 20 MG tablet, Take 20 mg by mouth daily., Disp: , Rfl:  .  triamterene-hydrochlorothiazide (MAXZIDE-25) 37.5-25 MG per tablet, Take 1 tablet by mouth daily.,  Disp: , Rfl:  .  atorvastatin (LIPITOR) 10 MG tablet, Take 1 tablet (10 mg total) by mouth at bedtime. (Patient not taking: Reported on 05/12/2014), Disp: 30 tablet, Rfl: 3 .  Cyanocobalamin 1000 MCG/ML KIT, Inject 1,000 mcg as directed every 30 (thirty) days. 15th of each month, Disp: , Rfl:  .  docusate sodium 100 MG CAPS, Take 100 mg by mouth 2 (two) times daily. (Patient not taking: Reported on 05/12/2014), Disp: 20 capsule, Rfl: 1 .  ondansetron (ZOFRAN ODT) 4 MG disintegrating tablet, Take 1 tablet (4 mg total) by mouth every 8 (eight) hours as needed for nausea or vomiting. (Patient not taking: Reported on 05/12/2014), Disp: 30 tablet, Rfl: 1 .  oxyCODONE-acetaminophen (PERCOCET) 7.5-325 MG per tablet, Take 1-2 tablets by mouth every 4 (four) hours as needed for pain. (Patient not taking: Reported on 05/12/2014), Disp: 60 tablet, Rfl: 0 .  sulfamethoxazole-trimethoprim (BACTRIM DS) 800-160 MG per tablet, Take 1 tablet by mouth 2 (two) times daily. (Patient not taking: Reported on 05/12/2014), Disp: 20 tablet, Rfl: 0  Physical Exam: Blood pressure 103/55, pulse 70, temperature 99.2 F (37.3 C), temperature source Oral, resp. rate 17, height 5' 2"  (1.575 m), weight 81.647 kg (180 lb), SpO2 95 %.; Body mass index is 32.91 kg/(m^2). Temp:  [99.2 F (37.3 C)] 99.2 F (37.3 C) (03/06 2350) Pulse Rate:  [70-78] 70 (03/07 0230) Resp:  [15-21] 17 (03/07 0230) BP:  (100-119)/(47-56) 103/55 mmHg (03/07 0230) SpO2:  [93 %-96 %] 95 % (03/07 0230) Weight:  [81.647 kg (180 lb)] 81.647 kg (180 lb) (03/06 2350)  No intake or output data in the 24 hours ending 05/12/14 0352 General: NAD Heent: MMM Neck: No JVD  CV: Nondisplaced PMI.  RRR, nl S1/S2, no S3/S4, no murmur. No carotid bruit   Lungs: Clear to auscultation bilaterally with normal respiratory effort Abdomen: Soft, nontender, nondistended Extremities: No clubbing or cyanosis.  Normal pedal pulses. No pedal edema Skin: Intact without lesions or rashes  Neurologic: Alert and oriented x 3, grossly nonfocal  Psych: Normal mood and affect    Labs: No results for input(s): CKTOTAL, CKMB, TROPONINI in the last 72 hours. Lab Results  Component Value Date   WBC 7.8 05/12/2014   HGB 10.9* 05/12/2014   HCT 34.5* 05/12/2014   MCV 86.3 05/12/2014   PLT 185 05/12/2014    Recent Labs Lab 05/12/14 0019  NA 139  K 2.9*  CL 100  CO2 28  BUN 21  CREATININE 1.00  CALCIUM 8.8  PROT 7.4  BILITOT 0.7  ALKPHOS 93  ALT 52*  AST 71*  GLUCOSE 136*   Lab Results  Component Value Date   CHOL 132 07/20/2013   HDL 22* 07/20/2013   LDLCALC 52 07/20/2013   TRIG 291* 07/20/2013       EKG:  Sinus rhythm with non-specific ST/T changes, no sig changes compared to previous ECG last year.   Radiology:  Dg Chest Port 1 View  05/12/2014   CLINICAL DATA:  Chest pain tonight.  Shortness of breath.  EXAM: PORTABLE CHEST - 1 VIEW  COMPARISON:  08/26/2013  FINDINGS: Postoperative changes in the mediastinum and left shoulder. Normal heart size and pulmonary vascularity. No focal airspace disease or consolidation in the lungs. No blunting of costophrenic angles. No pneumothorax. Mediastinal contours appear intact.  IMPRESSION: No active disease.   Electronically Signed   By: Lucienne Capers M.D.   On: 05/12/2014 00:54    ASSESSMENT:  64 yo with significant  history of CAD s/p CABG, and PCIs who is s/p abdominal  surgery last fall, presented with  1. Unstable angina  PLAN: 1. Continue cycling troponin 2. Nuclear stress in AM, unless significant troponin elevation  3. ASA +Plavix. Nitro prn. Continue Metoprolol from home. Continue Statin and Zetia.    Thank you for this consultation.  Will continue to follow.  Signed: Manus Gunning, MD Cardiology Fellow 05/12/2014, 3:52 AM

## 2014-05-12 NOTE — Progress Notes (Signed)
CARE MANAGEMENT NOTE 05/12/2014  Patient:  Wendy [name], Wendy [name]   Account Number:  [id number]  Date Initiated:  05/12/2014  Documentation initiated by:  [facility]  Subjective/Objective Assessment:   chest pain     Action/Plan:   No NCM needs identified.   Anticipated DC Date:  05/12/2014   Anticipated DC Plan:  [place]  CM consult      Choice offered to / List presented to:             Status of service:  Completed, signed off Medicare Important Message given?  NA - LOS <3 / Initial given by admissions (If response is "NO", the following Medicare IM given date fields will be blank) Date Medicare IM given:   Medicare IM given by:   Date Additional Medicare IM given:   Additional Medicare IM given by:    Discharge Disposition:  HOME/SELF CARE  Per UR Regulation:  Reviewed for med. necessity/level of care/duration of stay  If discussed at [place] of Stay Meetings, dates discussed:    Comments:  05/12/2014 1730 Chart reviewed. Utilization review completed. Jonnie Finner RN CCM Case Mgmt phone [phone]

## 2014-05-12 NOTE — Discharge Summary (Addendum)
Physician Discharge Summary  Wendy [name] [id number]:[id number] DOB: Aug 26, 1950 DOA: 05/11/2014  PCP: Daphene Jaeger, PA-C  Admit date: 05/11/2014 Discharge date: 05/12/2014  Time spent: 40 minutes    Discharge Condition: stable Diet recommendation: heart healthy  Discharge Diagnoses:  Principal Problem:   Acute angina Active Problems:   CAD (coronary artery disease)   Hypertension   HLD (hyperlipidemia)   History of present illness:  Wendy [name] is a 64 y.o. female with a history of C AD, HTN, Hyperlipidemia who presents to the ED with complaints of sharp substernal are chest pain that started this afternoon, and was associated with SOB, nausea and vomiting. She rated the pain at an 8/10. The pain occurred at rest. Her chest pain was relieved with NTG. She also has complaints of severe intermittent RUQ ABD Pain since her Gall bladder surgery 57/3220 with complications of Infection then surgery with and I+D, and Abx Rx. Her initial cardiac studies in the ED have been negative and cardiology was consulted by the ED, and the plan is for probable stress testing in the AM.   Hospital Course:  Principal Problem:  1. Acute angina  Troponins negative On Metoprolol, Plavix and ASA as outpt Myoview strest test negative- pain likely due to stress/ anxiety in relation to her sister's death 52moago.   Active Problems:  2. CAD (coronary artery disease) On Imdur, Metoprolol, and Atorvastatin, Plavix and ASA    3. Hypertension On Metoprolol Rx   4. HLD (hyperlipidemia) Continue Atorvastatin     Procedures:  Myoveiw stress test  Consultations:  cardiology  Discharge Exam: Filed Weights   05/11/14 2350 05/12/14 0503  Weight: 81.647 kg (180 lb) 82 kg (180 lb 12.4 oz)    Filed Vitals:   05/12/14 1344  BP: 120/70  Pulse: 53  Temp: 98.5 F (36.9 C)  Resp: 18    General: AAO x 3, no distress Cardiovascular: RRR, no murmurs  Respiratory: clear to auscultation bilaterally GI: soft, non-tender, non-distended, bowel sound positive  Discharge Instructions You were cared for by a hospitalist during your hospital stay. If you have any questions about your discharge medications or the care you received while you were in the hospital after you are discharged, you can call the unit and asked to speak with the hospitalist on call if the hospitalist that took care of you is not available. Once you are discharged, your primary care physician will handle any further medical issues. Please note that NO REFILLS for any discharge medications will be authorized once you are discharged, as it is imperative that you return to your primary care physician (or establish a relationship with a primary care physician if you do not have one) for your aftercare needs so that they can reassess your need for medications and monitor your lab values.  Discharge Instructions    Diet - low sodium heart healthy    Complete by:  As directed      Increase activity slowly    Complete by:  As directed             Medication List    STOP taking these medications        oxyCODONE-acetaminophen 5-325 MG per tablet  Commonly known as:  PERCOCET/ROXICET     oxyCODONE-acetaminophen 7.5-325 MG per tablet  Commonly known as:  PERCOCET      TAKE these medications        albuterol 108 (90 BASE) MCG/ACT inhaler  Commonly known as:  PROVENTIL  HFA;VENTOLIN HFA  Inhale 2 puffs into the lungs every 6 (six) hours as needed for wheezing or shortness of breath.     ALPRAZolam 0.5 MG tablet  Commonly known as:  XANAX  Take 1 mg by mouth at bedtime as needed for anxiety.     aspirin EC 81 MG tablet  Take 324 mg by mouth daily as needed (chest pain).     atorvastatin 10 MG tablet  Commonly  known as:  LIPITOR  Take 1 tablet (10 mg total) by mouth at bedtime.     beclomethasone 80 MCG/ACT inhaler  Commonly known as:  QVAR  Inhale 2 puffs into the lungs 2 (two) times daily as needed (shortness of breath).     cetirizine 10 MG tablet  Commonly known as:  ZYRTEC  Take 10 mg by mouth daily.     clopidogrel 75 MG tablet  Commonly known as:  PLAVIX  Take 75 mg by mouth daily.     Cyanocobalamin 1000 MCG/ML Kit  Inject 1,000 mcg as directed every 30 (thirty) days. 15th of each month     DSS 100 MG Caps  Take 100 mg by mouth 2 (two) times daily.     ezetimibe 10 MG tablet  Commonly known as:  ZETIA  Take 10 mg by mouth daily.     FISH OIL + D3 PO  Take 1 capsule by mouth daily.     Fluticasone-Salmeterol 250-50 MCG/DOSE Aepb  Commonly known as:  ADVAIR  Inhale 1 puff into the lungs 2 (two) times daily as needed (shortness of breath).     isosorbide mononitrate 120 MG 24 hr tablet  Commonly known as:  IMDUR  Take 120 mg by mouth daily.     metoprolol 100 MG tablet  Commonly known as:  LOPRESSOR  Take 1 tablet (100 mg total) by mouth 2 (two) times daily.     mometasone-formoterol 100-5 MCG/ACT Aero  Commonly known as:  DULERA  Inhale 2 puffs into the lungs 2 (two) times daily as needed for wheezing.     nitroGLYCERIN 0.4 MG SL tablet  Commonly known as:  NITROSTAT  Place 0.4 mg under the tongue every 5 (five) minutes as needed for chest pain.     omeprazole 40 MG capsule  Commonly known as:  PRILOSEC  Take 40 mg by mouth 2 (two) times daily.     ondansetron 4 MG disintegrating tablet  Commonly known as:  ZOFRAN ODT  Take 1 tablet (4 mg total) by mouth every 8 (eight) hours as needed for nausea or vomiting.     promethazine 12.5 MG tablet  Commonly known as:  PHENERGAN  Take 1-2 tablets (12.5-25 mg total) by mouth every 6 (six) hours as needed for nausea.     sertraline 25 MG tablet  Commonly known as:  ZOLOFT  Take 25 mg by mouth daily.      simvastatin 20 MG tablet  Commonly known as:  ZOCOR  Take 20 mg by mouth daily.     sulfamethoxazole-trimethoprim 800-160 MG per tablet  Commonly known as:  BACTRIM DS  Take 1 tablet by mouth 2 (two) times daily.     triamterene-hydrochlorothiazide 37.5-25 MG per tablet  Commonly known as:  MAXZIDE-25  Take 1 tablet by mouth daily.     TUDORZA PRESSAIR 400 MCG/ACT Aepb  Generic drug:  Aclidinium Bromide  Inhale 1 puff into the lungs daily as needed (shorntess of breath).       Allergies  Allergen Reactions  . Benadryl [Diphenhydramine] Shortness Of Breath  . Adhesive [Tape]     Burning of skin  . Flexeril [Cyclobenzaprine] Other (See Comments)    States that it messes with her blood circulation; pt states that she has to walk around      The results of significant diagnostics from this hospitalization (including imaging, microbiology, ancillary and laboratory) are listed below for reference.    Significant Diagnostic Studies: Nm Myocar Multi W/spect W/wall Motion / Ef  05/12/2014   CLINICAL DATA:  Chest pain. Hypertension. Peripheral vascular disease. COPD. Coronary artery disease. Prior myocardial infarction. Prior cardiac stenting.  EXAM: MYOCARDIAL IMAGING WITH SPECT (REST AND PHARMACOLOGIC-STRESS)  GATED LEFT VENTRICULAR WALL MOTION STUDY  LEFT VENTRICULAR EJECTION FRACTION  TECHNIQUE: Standard myocardial SPECT imaging was performed after resting intravenous injection of 10 mCi Tc-74msestamibi. Subsequently, intravenous infusion of Lexiscan was performed under the supervision of the Cardiology staff. At peak effect of the drug, 30 mCi [id number] was injected intravenously and standard myocardial SPECT imaging was performed. Quantitative gated imaging was also performed to evaluate left ventricular wall motion, and estimate left ventricular ejection fraction.  COMPARISON:  05/12/2014 chest radiograph  FINDINGS: Perfusion: Large inferior and apical fixed defect on stress and  rest images favoring scar. No inducible ischemia observed.  Wall Motion: Septal hypokinesis  Left Ventricular Ejection Fraction: 6930%  End diastolic volume 57 ml  End systolic volume 18 ml  IMPRESSION: 1. No inducible ischemia. Large scar along the inferior cardiac apex. No left ventricular dilatation.  2. Septal hypokinesis.  3. Left ventricular ejection fraction 69%  4. Low-risk stress test findings*.  *2012 Appropriate Use Criteria for Coronary Revascularization Focused Update: J Am Coll Cardiol. 201601;09(3):235-573http://content.[email or web address]?articleid=[email or web address]   Electronically Signed   By: WaVan Clines.D.   On: 05/12/2014 13:27   Dg Chest Port 1 View  05/12/2014   CLINICAL DATA:  Chest pain tonight.  Shortness of breath.  EXAM: PORTABLE CHEST - 1 VIEW  COMPARISON:  08/26/2013  FINDINGS: Postoperative changes in the mediastinum and left shoulder. Normal heart size and pulmonary vascularity. No focal airspace disease or consolidation in the lungs. No blunting of costophrenic angles. No pneumothorax. Mediastinal contours appear intact.  IMPRESSION: No active disease.   Electronically Signed   By: WiLucienne Capers.D.   On: 05/12/2014 00:54    Microbiology: No results found for this or any previous visit (from the past 240 hour(s)).   Labs: Basic Metabolic Panel:  Recent Labs Lab 05/12/14 0019 05/12/14 0655 05/12/14 1122  NA 139 137  --   K 2.9* 2.9*  --   CL 100 101  --   CO2 28 29  --   GLUCOSE 136* 104*  --   BUN 21 17  --   CREATININE 1.00 0.79  --   CALCIUM 8.8 8.8  --   MG  --   --  1.8   Liver Function Tests:  Recent Labs Lab 05/12/14 0019  AST 71*  ALT 52*  ALKPHOS 93  BILITOT 0.7  PROT 7.4  ALBUMIN 3.5    Recent Labs Lab 05/12/14 0019  LIPASE 60*   No results for input(s): AMMONIA in the last 168 hours. CBC:  Recent Labs Lab 05/12/14 0019 05/12/14 0655  WBC 7.8 6.9  HGB 10.9* 10.8*  HCT 34.5* 34.4*  MCV 86.3 84.7  PLT 185 176    Cardiac Enzymes:  Recent Labs Lab 05/12/14 0414 05/12/14 1122  TROPONINI <0.03 <0.03  BNP: BNP (last 3 results)  Recent Labs  05/12/14 0019  BNP 72.7    ProBNP (last 3 results) No results for input(s): PROBNP in the last 8760 hours.  CBG: No results for input(s): GLUCAP in the last 168 hours.     SignedDebbe Odea, MD [facility] 05/12/2014, 4:56 PM

## 2014-05-12 NOTE — H&P (Signed)
[facility] Admission History and Physical       Wendy [name] IWP:[id number] DOB: 11-Jun-1950 DOA: 05/11/2014  Referring physician: EDP PCP: Daphene Jaeger, PA-C  Specialists:   Chief Complaint: Chest Pain  HPI: Wendy [name] is a 64 y.o. female with a history of C AD, HTN, Hyperlipidemia who presents to the ED with complaints of sharp substernal are chest pain that started this afternoon, and was associated with SOB, nausea and vomiting.  She rated the pain at an 8/10.    The pain occurred at rest.   Her chest pain was relieved with NTG.   She also has complaints of severe intermittent RUQ ABD Pain since her Gall bladder surgery 50/5397 with complications of Infection then surgery with and I+D, and Abx Rx.  Her initial cardiac studies in the ED have been negative and cardiology was consulted by the ED, and the plan is for probable stress testing in the AM.     Review of Systems:    Constitutional: No Weight Loss, No Weight Gain, Night Sweats, Fevers, Chills, Dizziness, Light Headedness, Fatigue, or Generalized Weakness HEENT: No Headaches, Difficulty Swallowing,Tooth/Dental Problems,Sore Throat,  No Sneezing, Rhinitis, Ear Ache, Nasal Congestion, or Post Nasal Drip,  Cardio-vascular:  +Chest pain, Orthopnea, PND, Edema in Lower Extremities, Anasarca, Dizziness, Palpitations  Resp: +Dyspnea, No DOE, No Productive Cough, No Non-Productive Cough, No Hemoptysis, No Wheezing.    GI: No Heartburn, Indigestion, Abdominal Pain, +Nausea, Vomiting, Diarrhea, Constipation, Hematemesis, Hematochezia, Melena, Change in Bowel Habits,  Loss of Appetite  GU: No Dysuria, No Change in Color of Urine, No Urgency or Urinary Frequency, No Flank pain.  Musculoskeletal: No Joint Pain or Swelling, No Decreased Range of Motion, No Back Pain.  Neurologic: No Syncope, No Seizures, Muscle Weakness, Paresthesia, Vision Disturbance or Loss, No Diplopia, No Vertigo, No Difficulty Walking,  Skin: No Rash or  Lesions. Psych: No Change in Mood or Affect, No Depression or Anxiety, No Memory loss, No Confusion, or Hallucinations   Past Medical History  Diagnosis Date  . Glucose intolerance (impaired glucose tolerance)   . Hypertension   . PAD (peripheral artery disease)   . COPD (chronic obstructive pulmonary disease)   . Peripheral neuropathy   . Chronic back pain   . Chronic leg pain   . CAD (coronary artery disease), native coronary artery     Hx CABG 1998, last PCI 2013; LHC 07/2013:  EF 55-60%, L-LAD prob atretic, no sig disease in LAD, S-OM1 ok with patent stent, S-dRCA ok => med Rx.  Marland Kitchen HLD (hyperlipidemia)   . Myocardial infarction   . Sleep apnea   . Shortness of breath     when walking  . Anxiety   . GERD (gastroesophageal reflux disease)      Past Surgical History  Procedure Laterality Date  . Cardiac surgery  1998    LIMA-LAD, SVG-OM, SVG-RCA  . Coronary angioplasty with stent placement  11/2011, 02/2012    DES to SVG-RCA both times, In [place], [place], Dr. Bruce Donath  . Cervical spine surgery  1991 and again several years later  . Knee arthroscopy Right 1998  . Finger surgery Right     ring finger  . Orif humerus fracture Left 08/27/2013    Procedure: OPEN REDUCTION INTERNAL FIXATION (ORIF) LEFT HUMERUS ;  Surgeon: Rozanna Box, MD;  Location: [facility];  Service: Orthopedics;  Laterality: Left;  . Cholecystectomy  09/17/2013    dr [name]  . Cholecystectomy N/A 09/16/2013  Procedure: LAPAROSCOPIC CHOLECYSTECTOMY CONVERTED TO OPEN CHOLECYSTECTOMY WITH CHOLANGIOGRAM;  Surgeon: [name], MD;  Location: [facility];  Service: General;  Laterality: N/A;  . Ercp N/A 09/19/2013    Procedure: ENDOSCOPIC RETROGRADE CHOLANGIOPANCREATOGRAPHY (ERCP);  Surgeon: [name], MD;  Location: [facility];  Service: Endoscopy;  Laterality: N/A;  . I and d right subcostal wound  10/17/13  . Irrigation and debridement abscess Right 10/17/2013    Procedure: INCISION AND DRAINAGE RIGHT  SUBCOSTAL WOUND;  Surgeon: [name], MD;  Location: [facility];  Service: General;  Laterality: Right;  . Left heart catheterization with coronary/graft angiogram N/A 07/19/2013    Procedure: LEFT HEART CATHETERIZATION WITH Beatrix Fetters;  Surgeon: Leonie Man, MD;  Location: [facility];  Service: Cardiovascular;  Laterality: N/A;      Prior to Admission medications   Medication Sig Start Date End Date Taking? Authorizing Provider  Aclidinium Bromide (TUDORZA PRESSAIR) 400 MCG/ACT AEPB Inhale 1 puff into the lungs daily as needed (shorntess of breath).    Yes Historical Provider, MD  albuterol (PROVENTIL HFA;VENTOLIN HFA) 108 (90 BASE) MCG/ACT inhaler Inhale 2 puffs into the lungs every 6 (six) hours as needed for wheezing or shortness of breath.   Yes Historical Provider, MD  ALPRAZolam Duanne Moron) 0.5 MG tablet Take 1 mg by mouth at bedtime as needed for anxiety.   Yes Historical Provider, MD  aspirin EC 81 MG tablet Take 324 mg by mouth daily as needed (chest pain).    Yes Historical Provider, MD  beclomethasone (QVAR) 80 MCG/ACT inhaler Inhale 2 puffs into the lungs 2 (two) times daily as needed (shortness of breath).    Yes Historical Provider, MD  cetirizine (ZYRTEC) 10 MG tablet Take 10 mg by mouth daily.   Yes Historical Provider, MD  clopidogrel (PLAVIX) 75 MG tablet Take 75 mg by mouth daily.   Yes Historical Provider, MD  ezetimibe (ZETIA) 10 MG tablet Take 10 mg by mouth daily.   Yes Historical Provider, MD  Fish Oil-Cholecalciferol (FISH OIL + D3 PO) Take 1 capsule by mouth daily.   Yes Historical Provider, MD  Fluticasone-Salmeterol (ADVAIR) 250-50 MCG/DOSE AEPB Inhale 1 puff into the lungs 2 (two) times daily as needed (shortness of breath).    Yes Historical Provider, MD  isosorbide mononitrate (IMDUR) 120 MG 24 hr tablet Take 120 mg by mouth daily.   Yes Historical Provider, MD  metoprolol (LOPRESSOR) 100 MG tablet Take 1 tablet (100 mg total) by mouth 2 (two) times  daily. 09/22/13  Yes Ripudeep Krystal Eaton, MD  mometasone-formoterol (DULERA) 100-5 MCG/ACT AERO Inhale 2 puffs into the lungs 2 (two) times daily as needed for wheezing.   Yes Historical Provider, MD  nitroGLYCERIN (NITROSTAT) 0.4 MG SL tablet Place 0.4 mg under the tongue every 5 (five) minutes as needed for chest pain.   Yes Historical Provider, MD  omeprazole (PRILOSEC) 40 MG capsule Take 40 mg by mouth 2 (two) times daily.   Yes Historical Provider, MD  oxyCODONE-acetaminophen (PERCOCET/ROXICET) 5-325 MG per tablet Take 1-2 tablets by mouth every 6 (six) hours as needed for severe pain. 10/22/13  Yes Saverio Danker, PA-C  promethazine (PHENERGAN) 12.5 MG tablet Take 1-2 tablets (12.5-25 mg total) by mouth every 6 (six) hours as needed for nausea. 11/04/13  Yes Coralie Keens, MD  sertraline (ZOLOFT) 25 MG tablet Take 25 mg by mouth daily.   Yes Historical Provider, MD  simvastatin (ZOCOR) 20 MG tablet Take 20 mg by mouth daily.  Yes Historical Provider, MD  triamterene-hydrochlorothiazide (MAXZIDE-25) 37.5-25 MG per tablet Take 1 tablet by mouth daily.   Yes Historical Provider, MD  atorvastatin (LIPITOR) 10 MG tablet Take 1 tablet (10 mg total) by mouth at bedtime. Patient not taking: Reported on 05/12/2014 09/21/13   Ripudeep Krystal Eaton, MD  Cyanocobalamin 1000 MCG/ML KIT Inject 1,000 mcg as directed every 30 (thirty) days. 15th of each month    Historical Provider, MD  docusate sodium 100 MG CAPS Take 100 mg by mouth 2 (two) times daily. Patient not taking: Reported on 05/12/2014 08/28/13   Jari Pigg, PA-C  ondansetron (ZOFRAN ODT) 4 MG disintegrating tablet Take 1 tablet (4 mg total) by mouth every 8 (eight) hours as needed for nausea or vomiting. Patient not taking: Reported on 05/12/2014 10/22/13   Saverio Danker, PA-C  oxyCODONE-acetaminophen (PERCOCET) 7.5-325 MG per tablet Take 1-2 tablets by mouth every 4 (four) hours as needed for pain. Patient not taking: Reported on 05/12/2014 11/04/13 11/04/14  Coralie Keens, MD  sulfamethoxazole-trimethoprim (BACTRIM DS) 800-160 MG per tablet Take 1 tablet by mouth 2 (two) times daily. Patient not taking: Reported on 05/12/2014 10/22/13   Saverio Danker, PA-C     Allergies  Allergen Reactions  . Benadryl [Diphenhydramine] Shortness Of Breath  . Adhesive [Tape]     Burning of skin  . Flexeril [Cyclobenzaprine] Other (See Comments)    States that it messes with her blood circulation; pt states that she has to walk around    Social History:  reports that she quit smoking about 15 years ago. She has never used smokeless tobacco. She reports that she does not drink alcohol or use illicit drugs.    Family History  Problem Relation Age of Onset  . Lung cancer Mother        Physical Exam:  GEN:  Pleasant Obese  64 y.o. Caucasian female examined and in no acute distress; cooperative with exam Filed Vitals:   05/12/14 0100 05/12/14 0130 05/12/14 0200 05/12/14 0230  BP: 105/49 102/51 100/56 103/55  Pulse: 70 71 74 70  Temp:      TempSrc:      Resp: 20 15 21 17   Height:      Weight:      SpO2: 93% 95% 93% 95%   Blood pressure 103/55, pulse 70, temperature 99.2 F (37.3 C), temperature source Oral, resp. rate 17, height 5' 2"  (1.575 m), weight 81.647 kg (180 lb), SpO2 95 %. PSYCH: She is alert and oriented x4; does not appear anxious does not appear depressed; affect is normal HEENT: Normocephalic and Atraumatic, Mucous membranes pink; PERRLA; EOM intact; Fundi:  Benign;  No scleral icterus, Nares: Patent, Oropharynx: Clear, Fair Dentition,    Neck:  FROM, No Cervical Lymphadenopathy nor Thyromegaly or Carotid Bruit; No JVD; Breasts:: Not examined CHEST WALL: No tenderness CHEST: Normal respiration, clear to auscultation bilaterally HEART: Regular rate and rhythm; no murmurs rubs or gallops BACK: No kyphosis or scoliosis; No CVA tenderness ABDOMEN: Positive Bowel Sounds, Obese, Soft Non-Tender, No Rebound or Guarding; No Masses, No  Organomegaly. Rectal Exam: Not done EXTREMITIES: No Cyanosis, Clubbing, or Edema; No Ulcerations. Genitalia: not examined PULSES: 2+ and symmetric SKIN: Normal hydration no rash or ulceration CNS:  Alert and Oriented x 4, No Focal Deficits Vascular: pulses palpable throughout    Labs on Admission:  Basic Metabolic Panel:  Recent Labs Lab 05/12/14 0019  NA 139  K 2.9*  CL 100  CO2 28  GLUCOSE 136*  BUN 21  CREATININE 1.00  CALCIUM 8.8   Liver Function Tests:  Recent Labs Lab 05/12/14 0019  AST 71*  ALT 52*  ALKPHOS 93  BILITOT 0.7  PROT 7.4  ALBUMIN 3.5    Recent Labs Lab 05/12/14 0019  LIPASE 60*   No results for input(s): AMMONIA in the last 168 hours. CBC:  Recent Labs Lab 05/12/14 0019  WBC 7.8  HGB 10.9*  HCT 34.5*  MCV 86.3  PLT 185   Cardiac Enzymes: No results for input(s): CKTOTAL, CKMB, CKMBINDEX, TROPONINI in the last 168 hours.  BNP (last 3 results)  Recent Labs  05/12/14 0019  BNP 72.7    ProBNP (last 3 results) No results for input(s): PROBNP in the last 8760 hours.  CBG: No results for input(s): GLUCAP in the last 168 hours.  Radiological Exams on Admission: Dg Chest Port 1 View  05/12/2014   CLINICAL DATA:  Chest pain tonight.  Shortness of breath.  EXAM: PORTABLE CHEST - 1 VIEW  COMPARISON:  08/26/2013  FINDINGS: Postoperative changes in the mediastinum and left shoulder. Normal heart size and pulmonary vascularity. No focal airspace disease or consolidation in the lungs. No blunting of costophrenic angles. No pneumothorax. Mediastinal contours appear intact.  IMPRESSION: No active disease.   Electronically Signed   By: Lucienne Capers M.D.   On: 05/12/2014 00:54     EKG: Independently reviewed Normal Sinus Rhythm rate =80 No acute S-T Changes   Assessment/Plan:   64 y.o. female with  Principal Problem:   1.     Acute angina   Telemetry Monitoring   Cycle Troponins   Nitropaste, O2     Continue Metoprolol, Plavix  and ASA   Cards consulted to see for Stress testing this AM.     Active Problems:   2.    CAD (coronary artery disease)   On Imdur, Metoprolol, and Atorvastatin, Plavix and ASA     3.    Hypertension   On Metoprolol Rx   Monitor BPs     4.    HLD (hyperlipidemia)   Continue Atorvastatin     5.    DVT Prophylaxis   Lovenox          Code Status:     FULL CODE      Family Communication:   No Family Present    Disposition Plan:   Observation Status        Time spent:  [place] [facility] Pager [phone]   If [place] Please Contact the Day Rounding Team MD for [facility]  If 7PM-7AM, Please Contact Night-Floor Coverage  [email or web address] Password [email or web address] 05/12/2014, 3:44 AM     ADDENDUM:   Patient was seen and examined on 05/12/2014

## 2014-05-12 NOTE — Progress Notes (Signed)
Patient Profile: 64 yo with significant history of CAD s/p CABG, and PCIs who is s/p abdominal surgery last fall, presented to ER with chest pain for couple of weeks.  Subjective: No complaints currently. CP free. No dyspnea.   Objective: Vital signs in last 24 hours: Temp:  [98.7 F (37.1 C)-99.2 F (37.3 C)] 98.9 F (37.2 C) (03/07 0503) Pulse Rate:  [70-84] 84 (03/07 0503) Resp:  [12-21] 18 (03/07 0503) BP: (100-150)/(47-62) 150/62 mmHg (03/07 0503) SpO2:  [93 %-100 %] 100 % (03/07 0503) Weight:  [180 lb (81.647 kg)-180 lb 12.4 oz (82 kg)] 180 lb 12.4 oz (82 kg) (03/07 0503) Last BM Date: 05/11/14  Intake/Output from previous day:   Intake/Output this shift:    Medications Current Facility-Administered Medications  Medication Dose Route Frequency Provider Last Rate Last Dose  . 0.9 %  sodium chloride infusion  250 mL Intravenous PRN Theressa Millard, MD      . acetaminophen (TYLENOL) tablet 650 mg  650 mg Oral Q6H PRN Theressa Millard, MD       Or  . acetaminophen (TYLENOL) suppository 650 mg  650 mg Rectal Q6H PRN Theressa Millard, MD      . ALPRAZolam Duanne Moron) tablet 1 mg  1 mg Oral QHS PRN Theressa Millard, MD      . alum & mag hydroxide-simeth (MAALOX/MYLANTA) 200-200-20 MG/5ML suspension 30 mL  30 mL Oral Q6H PRN Theressa Millard, MD      . aspirin chewable tablet 81 mg  81 mg Oral Daily Manus Gunning, MD      . atorvastatin (LIPITOR) tablet 80 mg  80 mg Oral q1800 Manus Gunning, MD      . cholecalciferol (VITAMIN D) tablet 1,000 Units  1,000 Units Oral Daily Harvette Evonnie Dawes, MD      . enoxaparin (LOVENOX) injection 40 mg  40 mg Subcutaneous Daily Harvette Evonnie Dawes, MD      . ezetimibe (ZETIA) tablet 10 mg  10 mg Oral Daily Theressa Millard, MD      . fluticasone (FLOVENT HFA) 44 MCG/ACT inhaler 2 puff  2 puff Inhalation BID Theressa Millard, MD   2 puff at 05/12/14 0800  . HYDROmorphone (DILAUDID) injection 0.5-1 mg  0.5-1 mg Intravenous Q3H PRN Theressa Millard, MD      . isosorbide mononitrate (IMDUR) 24 hr tablet 120 mg  120 mg Oral Daily Harvette Evonnie Dawes, MD      . loratadine (CLARITIN) tablet 10 mg  10 mg Oral Daily Harvette Evonnie Dawes, MD      . metoprolol (LOPRESSOR) tablet 100 mg  100 mg Oral BID Theressa Millard, MD      . morphine 4 MG/ML injection 4 mg  4 mg Intravenous Once Merryl Hacker, MD   4 mg at 05/12/14 0218  . nitroGLYCERIN (NITROGLYN) 2 % ointment 0.5 inch  0.5 inch Topical 4 times per day Theressa Millard, MD   0.5 inch at 05/12/14 0730  . nitroGLYCERIN (NITROSTAT) SL tablet 0.4 mg  0.4 mg Sublingual Q5 min PRN Theressa Millard, MD      . omega-3 acid ethyl esters (LOVAZA) capsule 1 g  1 g Oral Daily Harvette Evonnie Dawes, MD      . ondansetron (ZOFRAN) tablet 4 mg  4 mg Oral Q6H PRN Theressa Millard, MD       Or  . ondansetron (ZOFRAN) injection 4 mg  4 mg Intravenous Q6H PRN Harvette  Evonnie Dawes, MD      . oxyCODONE (Oxy IR/ROXICODONE) immediate release tablet 5 mg  5 mg Oral Q4H PRN Theressa Millard, MD      . pantoprazole (PROTONIX) EC tablet 40 mg  40 mg Oral Daily Theressa Millard, MD      . sertraline (ZOLOFT) tablet 25 mg  25 mg Oral Daily Harvette Evonnie Dawes, MD      . sodium chloride 0.9 % injection 3 mL  3 mL Intravenous Q12H Theressa Millard, MD      . sodium chloride 0.9 % injection 3 mL  3 mL Intravenous PRN Theressa Millard, MD        PE: General appearance: alert, cooperative and no distress Neck: no carotid bruit and no JVD Lungs: clear to auscultation bilaterally Heart: regular rate and rhythm, S1, S2 normal, no murmur, click, rub or gallop Extremities: no LEE Pulses: 2+ and symmetric Skin: warm and dry Neurologic: Grossly normal  Lab Results:   Recent Labs  05/12/14 0019 05/12/14 0655  WBC 7.8 6.9  HGB 10.9* 10.8*  HCT 34.5* 34.4*  PLT 185 176   BMET  Recent Labs  05/12/14 0019 05/12/14 0655  NA 139 137  K 2.9* 2.9*  CL 100 101  CO2 28 29  GLUCOSE 136* 104*  BUN  21 17  CREATININE 1.00 0.79  CALCIUM 8.8 8.8   Cardiac Panel (last 3 results)  Recent Labs  05/12/14 0414  TROPONINI <0.03    Studies/Results: NST- pending   Assessment/Plan  Principal Problem:   Acute angina Active Problems:   Unstable angina   CAD (coronary artery disease)   Hypertension   HLD (hyperlipidemia)  1. Chest Pain: known h/o CAD s/p prior CABG. Followed by Dr. [name] in [place]. Cardiac enzymes are negative x 2. NST to assess for ischemia. Radiologist interpretation pending.   2. HTN: Moderately elevated in the 292K systolic. AM meds on hold for NST. Resume Metoprolol and Imdur.   3. HLD: continue statin therapy.   4. Hypokalemia: K on admit was 2.9 at 1:19 am. She was given supplemental K, 40 mEq, at 2:19. Repeat K at 6:55 still at 2.9. Will check STAT Mg. Supplement K with another 40 mEq.     LOS: 0 days    Brittainy M. Ladoris Gene 05/12/2014 9:11 AM  Personally seen and examined. Agree with above. Symptoms sound more GI related than cardiac. Await results of stress test. She states that the top of her stomach has hurt ever since gall bladder surgery.   Candee Furbish, MD

## 2014-05-12 NOTE — Progress Notes (Signed)
Pt slated for d/c home today awaiting f/up K level. F/up K level is 3.9. Pt may go home. Asked RN to tell pt to have her PCP check her K level at f/up visit or in 1-2 weeks.  Clance Boll, NP [facility]

## 2014-05-14 ENCOUNTER — Ambulatory Visit
Admission: RE | Admit: 2014-05-14 | Discharge: 2014-05-14 | Disposition: A | Discharge status: Home / Self Care | Payer: Medicare Other | Source: Ambulatory Visit | Attending: Surgery | Admitting: Surgery

## 2014-05-14 MED ORDER — IOPAMIDOL (ISOVUE-300) INJECTION 61%
100.0000 mL | Freq: Once | INTRAVENOUS | Status: AC | PRN
  Administered 2014-05-14: 100 mL via INTRAVENOUS

## 2014-06-09 ENCOUNTER — Other Ambulatory Visit: Payer: Self-pay | Admitting: Surgery

## 2014-06-23 ENCOUNTER — Encounter (HOSPITAL_BASED_OUTPATIENT_CLINIC_OR_DEPARTMENT_OTHER): Payer: Self-pay | Admitting: *Deleted

## 2014-06-23 NOTE — Progress Notes (Signed)
Pt was in hospital 3/16-cp-negative work up-stress test low risk-copd stable per pt-will need istat Will bring and use cpap post op-sister lives with her

## 2014-06-24 NOTE — H&P (Signed)
  Wendy [name] 06/09/2014 4:11 PM Location: [facility] Patient #: [id number] DOB: 1950/05/11 Divorced / Language: Wendy [name] / Race: White Female  History of Present Illness (Jerrico Covello A. [name] MD; 06/09/2014 4:34 PM) Patient words: fu after c/t scan.  The patient is a 64 year old female who presents with non-malignant abdominal pain. She is here today to follow by CAT scan of the abdomen and pelvis looking for a hernia. She reports that for several years she is having increasing pain in the right buttock as well. She has noticed a small mass in the buttock. She still has chronic pain in her incision in the right upper quadrant.   Allergies Elbert Ewings, CMA; 06/09/2014 4:12 PM) Flexeril *MUSCULOSKELETAL THERAPY AGENTS* Benadryl *ANTIHISTAMINES* Adhesive Tape *MEDICAL DEVICES* Amoxicillin *PENICILLINS* Diarrhea.  Medication History Elbert Ewings, CMA; 06/09/2014 4:12 PM) Percocet (5-325MG  Tablet, 1 (one) Tablet Oral every four hours, as needed, Taken starting 06/05/2014) Active. Clindamycin HCl (300MG  Capsule, 1 (one) Capsule Oral three times daily, Taken starting 11/26/2013) Active. ALPRAZolam (0.5MG  Tablet, Oral as needed) Active. Ventolin HFA (108 (90 Base)MCG/ACT Aerosol Soln, Inhalation as needed) Active. Advair Diskus (250-50MCG/DOSE Aero Pow Br Act, Inhalation daily) Active. Atorvastatin Calcium (10MG  Tablet, Oral daily) Active. Cetirizine HCl (10MG  Tablet, Oral as needed) Active. Zetia (10MG  Tablet, Oral daily) Active. Metoprolol Tartrate (100MG  Tablet, Oral daily) Active. Omeprazole (20MG  Capsule DR, Oral daily) Active. Nitrostat (0.4MG  Tab Sublingual, Sublingual as needed) Active. Metoprolol Tartrate (50MG  Tablet, Oral daily) Active. Promethazine HCl (12.5MG  Tablet, Oral as needed) Active. Cyanocobalamin (1000MCG/ML Solution, Injection as needed) Active. Triamterene-HCTZ (37.5-25MG  Tablet, Oral daily) Active. Medications Reconciled  Vitals  Elbert Ewings CMA; 06/09/2014 4:12 PM) 06/09/2014 4:12 PM Weight: 180 lb Height: 62in Body Surface Area: 1.89 m Body Mass Index: 32.92 kg/m Temp.: 96.46F(Temporal)  Pulse: 92 (Regular)  Resp.: 16 (Unlabored)  BP: 130/72 (Sitting, Left Arm, Standard)    Physical Exam (Daryn Hicks A. [name] MD; 06/09/2014 4:35 PM) The physical exam findings are as follows: Note:On physical examination, her abdominal wall feels lax but I cannot feel a fascial defect. Her abdomen is nontender.  I can palpate a deep 4 cm firm mass on the right buttock. There are no skin changes and no erythema  The CAT scan of the abdomen and pelvis showed no evidence of hernia. There was a small adnexal mass on the right side. The radiologist did not read the report of a mass in the buttocks there is an obvious calcified 4 cm mass in the subcutaneous change tissue of the right buttock. This is at the area of her palpable concern  Lungs clear  CV RRR  Ext without edema    Assessment & Plan (Kele Barthelemy A. [name] MD; 06/09/2014 4:36 PM) MASS OF LEG, RIGHT (782.2  R22.41) Impression: Right buttock mass of uncertain etiology. This is very symptomatic. Surgical excision is recommended because of her symptoms and to rule out malignancy. I discussed this with her in detail including the risks of surgery. Surgery will be scheduled. We will continue to follow her abdomen as well. Current Plans  Started Hydrocodone-Acetaminophen 5-325MG , 1 (one) Tablet every four hours, as needed, #60, 06/09/2014, No Refill.

## 2014-06-25 ENCOUNTER — Ambulatory Visit (HOSPITAL_BASED_OUTPATIENT_CLINIC_OR_DEPARTMENT_OTHER): Payer: Medicare Other | Admitting: Anesthesiology

## 2014-06-25 ENCOUNTER — Encounter (HOSPITAL_BASED_OUTPATIENT_CLINIC_OR_DEPARTMENT_OTHER): Payer: Self-pay | Admitting: Anesthesiology

## 2014-06-25 ENCOUNTER — Ambulatory Visit (HOSPITAL_BASED_OUTPATIENT_CLINIC_OR_DEPARTMENT_OTHER)
Admission: RE | Admit: 2014-06-25 | Discharge: 2014-06-25 | Disposition: A | Discharge status: Home / Self Care | Payer: Medicare Other | Source: Ambulatory Visit | Attending: Surgery | Admitting: Surgery

## 2014-06-25 ENCOUNTER — Encounter (HOSPITAL_BASED_OUTPATIENT_CLINIC_OR_DEPARTMENT_OTHER)
Admission: RE | Disposition: A | Discharge status: Home / Self Care | Payer: Self-pay | Source: Ambulatory Visit | Attending: Surgery

## 2014-06-25 DIAGNOSIS — G8929 Other chronic pain: Secondary | ICD-10-CM | POA: Insufficient documentation

## 2014-06-25 DIAGNOSIS — I10 Essential (primary) hypertension: Secondary | ICD-10-CM | POA: Insufficient documentation

## 2014-06-25 DIAGNOSIS — G4730 Sleep apnea, unspecified: Secondary | ICD-10-CM | POA: Insufficient documentation

## 2014-06-25 DIAGNOSIS — Z87891 Personal history of nicotine dependence: Secondary | ICD-10-CM | POA: Diagnosis not present

## 2014-06-25 DIAGNOSIS — Z9989 Dependence on other enabling machines and devices: Secondary | ICD-10-CM | POA: Diagnosis not present

## 2014-06-25 DIAGNOSIS — Z951 Presence of aortocoronary bypass graft: Secondary | ICD-10-CM | POA: Insufficient documentation

## 2014-06-25 DIAGNOSIS — M7989 Other specified soft tissue disorders: Secondary | ICD-10-CM | POA: Diagnosis not present

## 2014-06-25 DIAGNOSIS — Z792 Long term (current) use of antibiotics: Secondary | ICD-10-CM | POA: Insufficient documentation

## 2014-06-25 DIAGNOSIS — K219 Gastro-esophageal reflux disease without esophagitis: Secondary | ICD-10-CM | POA: Insufficient documentation

## 2014-06-25 DIAGNOSIS — I739 Peripheral vascular disease, unspecified: Secondary | ICD-10-CM | POA: Diagnosis not present

## 2014-06-25 DIAGNOSIS — Z79891 Long term (current) use of opiate analgesic: Secondary | ICD-10-CM | POA: Insufficient documentation

## 2014-06-25 DIAGNOSIS — I2510 Atherosclerotic heart disease of native coronary artery without angina pectoris: Secondary | ICD-10-CM | POA: Insufficient documentation

## 2014-06-25 DIAGNOSIS — Z6833 Body mass index (BMI) 33.0-33.9, adult: Secondary | ICD-10-CM | POA: Insufficient documentation

## 2014-06-25 DIAGNOSIS — R222 Localized swelling, mass and lump, trunk: Secondary | ICD-10-CM | POA: Diagnosis present

## 2014-06-25 DIAGNOSIS — Z7951 Long term (current) use of inhaled steroids: Secondary | ICD-10-CM | POA: Diagnosis not present

## 2014-06-25 DIAGNOSIS — F419 Anxiety disorder, unspecified: Secondary | ICD-10-CM | POA: Diagnosis not present

## 2014-06-25 DIAGNOSIS — J449 Chronic obstructive pulmonary disease, unspecified: Secondary | ICD-10-CM | POA: Diagnosis not present

## 2014-06-25 DIAGNOSIS — Z79899 Other long term (current) drug therapy: Secondary | ICD-10-CM | POA: Insufficient documentation

## 2014-06-25 DIAGNOSIS — I252 Old myocardial infarction: Secondary | ICD-10-CM | POA: Insufficient documentation

## 2014-06-25 HISTORY — PX: MASS EXCISION: SHX2000

## 2014-06-25 HISTORY — DX: Presence of spectacles and contact lenses: Z97.3

## 2014-06-25 LAB — POCT I-STAT, CHEM 8
BUN: 19 mg/dL (ref 6–23)
Calcium, Ion: 1.15 mmol/L (ref 1.13–1.30)
Chloride: 104 mmol/L (ref 96–112)
Creatinine, Ser: 0.8 mg/dL (ref 0.50–1.10)
Glucose, Bld: 138 mg/dL — ABNORMAL HIGH (ref 70–99)
HCT: 36 % (ref 36.0–46.0)
Hemoglobin: 12.2 g/dL (ref 12.0–15.0)
Potassium: 3.3 mmol/L — ABNORMAL LOW (ref 3.5–5.1)
Sodium: 143 mmol/L (ref 135–145)
TCO2: 22 mmol/L (ref 0–100)

## 2014-06-25 SURGERY — EXCISION MASS
Anesthesia: General | Site: Buttocks | Laterality: Right

## 2014-06-25 MED ORDER — PROPOFOL 10 MG/ML IV BOLUS
INTRAVENOUS | Status: DC | PRN
  Administered 2014-06-25: 200 mg via INTRAVENOUS

## 2014-06-25 MED ORDER — SODIUM CHLORIDE 0.9 % IJ SOLN: 3.0000 mL | Freq: Two times a day (BID) | INTRAMUSCULAR | Status: DC

## 2014-06-25 MED ORDER — OXYCODONE HCL 5 MG PO TABS: 5.0000 mg | ORAL_TABLET | ORAL | Status: DC | PRN

## 2014-06-25 MED ORDER — ONDANSETRON HCL 4 MG/2ML IJ SOLN
INTRAMUSCULAR | Status: DC | PRN
  Administered 2014-06-25: 4 mg via INTRAVENOUS

## 2014-06-25 MED ORDER — BUPIVACAINE-EPINEPHRINE 0.5% -1:200000 IJ SOLN
INTRAMUSCULAR | Status: DC | PRN
  Administered 2014-06-25: 10 mL

## 2014-06-25 MED ORDER — ACETAMINOPHEN 325 MG PO TABS: 650.0000 mg | ORAL_TABLET | ORAL | Status: DC | PRN

## 2014-06-25 MED ORDER — HYDROMORPHONE HCL 1 MG/ML IJ SOLN
INTRAMUSCULAR | Status: AC
Start: 2014-06-25 — End: 2014-06-25
  Filled 2014-06-25: qty 1

## 2014-06-25 MED ORDER — FENTANYL CITRATE (PF) 100 MCG/2ML IJ SOLN
INTRAMUSCULAR | Status: AC
  Filled 2014-06-25: qty 6

## 2014-06-25 MED ORDER — OXYCODONE HCL 5 MG PO TABS
5.0000 mg | ORAL_TABLET | Freq: Once | ORAL | Status: AC | PRN
  Administered 2014-06-25: 5 mg via ORAL

## 2014-06-25 MED ORDER — CEFAZOLIN SODIUM-DEXTROSE 2-3 GM-% IV SOLR
INTRAVENOUS | Status: AC
  Filled 2014-06-25: qty 50

## 2014-06-25 MED ORDER — HYDROMORPHONE HCL 1 MG/ML IJ SOLN
INTRAMUSCULAR | Status: AC
  Filled 2014-06-25: qty 1

## 2014-06-25 MED ORDER — MORPHINE SULFATE 2 MG/ML IJ SOLN: 1.0000 mg | INTRAMUSCULAR | Status: DC | PRN

## 2014-06-25 MED ORDER — MIDAZOLAM HCL 5 MG/5ML IJ SOLN
INTRAMUSCULAR | Status: DC | PRN
Start: 2014-06-25 — End: 2014-06-25
  Administered 2014-06-25: 2 mg via INTRAVENOUS

## 2014-06-25 MED ORDER — LACTATED RINGERS IV SOLN
INTRAVENOUS | Status: DC
  Administered 2014-06-25: 09:00:00 via INTRAVENOUS

## 2014-06-25 MED ORDER — SODIUM CHLORIDE 0.9 % IV SOLN: 250.0000 mL | INTRAVENOUS | Status: DC | PRN

## 2014-06-25 MED ORDER — DEXAMETHASONE SODIUM PHOSPHATE 4 MG/ML IJ SOLN
INTRAMUSCULAR | Status: DC | PRN
  Administered 2014-06-25: 10 mg via INTRAVENOUS

## 2014-06-25 MED ORDER — HYDROMORPHONE HCL 1 MG/ML IJ SOLN
0.2500 mg | INTRAMUSCULAR | Status: DC | PRN
  Administered 2014-06-25 (×3): 0.5 mg via INTRAVENOUS

## 2014-06-25 MED ORDER — PROMETHAZINE HCL 25 MG/ML IJ SOLN: 6.2500 mg | INTRAMUSCULAR | Status: DC | PRN

## 2014-06-25 MED ORDER — MIDAZOLAM HCL 2 MG/2ML IJ SOLN
INTRAMUSCULAR | Status: AC
  Filled 2014-06-25: qty 2

## 2014-06-25 MED ORDER — OXYCODONE HCL 5 MG PO TABS
ORAL_TABLET | ORAL | Status: AC
  Filled 2014-06-25: qty 1

## 2014-06-25 MED ORDER — OXYCODONE-ACETAMINOPHEN 5-325 MG PO TABS: 1.0000 | ORAL_TABLET | ORAL | Status: DC | PRN

## 2014-06-25 MED ORDER — CEFAZOLIN SODIUM-DEXTROSE 2-3 GM-% IV SOLR
INTRAVENOUS | Status: DC | PRN
  Administered 2014-06-25: 2 g via INTRAVENOUS

## 2014-06-25 MED ORDER — OXYCODONE HCL 5 MG/5ML PO SOLN: 5.0000 mg | Freq: Once | ORAL | Status: AC | PRN

## 2014-06-25 MED ORDER — ACETAMINOPHEN 650 MG RE SUPP: 650.0000 mg | RECTAL | Status: DC | PRN

## 2014-06-25 MED ORDER — FENTANYL CITRATE (PF) 100 MCG/2ML IJ SOLN
INTRAMUSCULAR | Status: DC | PRN
  Administered 2014-06-25: 100 ug via INTRAVENOUS

## 2014-06-25 MED ORDER — MIDAZOLAM HCL 2 MG/2ML IJ SOLN: 1.0000 mg | INTRAMUSCULAR | Status: DC | PRN

## 2014-06-25 MED ORDER — SUCCINYLCHOLINE CHLORIDE 20 MG/ML IJ SOLN
INTRAMUSCULAR | Status: DC | PRN
  Administered 2014-06-25: 50 mg via INTRAVENOUS

## 2014-06-25 MED ORDER — SODIUM CHLORIDE 0.9 % IJ SOLN: 3.0000 mL | INTRAMUSCULAR | Status: DC | PRN

## 2014-06-25 MED ORDER — CEFAZOLIN SODIUM-DEXTROSE 2-3 GM-% IV SOLR: 2.0000 g | INTRAVENOUS | Status: DC

## 2014-06-25 SURGICAL SUPPLY — 47 items
BLADE CLIPPER SURG (BLADE) IMPLANT
BLADE HEX COATED 2.75 (ELECTRODE) ×3 IMPLANT
BLADE SURG 15 STRL LF DISP TIS (BLADE) ×1 IMPLANT
BLADE SURG 15 STRL SS (BLADE) ×3
CANISTER SUCT 1200ML W/VALVE (MISCELLANEOUS) IMPLANT
CHLORAPREP W/TINT 26ML (MISCELLANEOUS) ×3 IMPLANT
CLOSURE WOUND 1/2 X4 (GAUZE/BANDAGES/DRESSINGS) ×1
COVER BACK TABLE 60X90IN (DRAPES) ×3 IMPLANT
COVER MAYO STAND STRL (DRAPES) ×3 IMPLANT
DECANTER SPIKE VIAL GLASS SM (MISCELLANEOUS) IMPLANT
DRAPE LAPAROTOMY 100X72 PEDS (DRAPES) ×3 IMPLANT
DRAPE UTILITY XL STRL (DRAPES) ×3 IMPLANT
DRSG TEGADERM 4X4.75 (GAUZE/BANDAGES/DRESSINGS) ×1 IMPLANT
ELECT REM PT RETURN 9FT ADLT (ELECTROSURGICAL) ×3
ELECTRODE REM PT RTRN 9FT ADLT (ELECTROSURGICAL) ×1 IMPLANT
GLOVE BIOGEL PI IND STRL 7.0 (GLOVE) IMPLANT
GLOVE BIOGEL PI INDICATOR 7.0 (GLOVE) ×2
GLOVE ECLIPSE 6.5 STRL STRAW (GLOVE) ×2 IMPLANT
GLOVE SURG SIGNA 7.5 PF LTX (GLOVE) ×3 IMPLANT
GOWN STRL REUS W/ TWL LRG LVL3 (GOWN DISPOSABLE) ×1 IMPLANT
GOWN STRL REUS W/ TWL XL LVL3 (GOWN DISPOSABLE) ×1 IMPLANT
GOWN STRL REUS W/TWL LRG LVL3 (GOWN DISPOSABLE) ×3
GOWN STRL REUS W/TWL XL LVL3 (GOWN DISPOSABLE) ×3
LIQUID BAND (GAUZE/BANDAGES/DRESSINGS) ×3 IMPLANT
NDL HYPO 25X1 1.5 SAFETY (NEEDLE) ×1 IMPLANT
NEEDLE HYPO 25X1 1.5 SAFETY (NEEDLE) ×3 IMPLANT
NS IRRIG 1000ML POUR BTL (IV SOLUTION) IMPLANT
PACK BASIN DAY SURGERY FS (CUSTOM PROCEDURE TRAY) ×3 IMPLANT
PENCIL BUTTON HOLSTER BLD 10FT (ELECTRODE) ×3 IMPLANT
SLEEVE SCD COMPRESS KNEE MED (MISCELLANEOUS) ×2 IMPLANT
SPONGE GAUZE 4X4 12PLY STER LF (GAUZE/BANDAGES/DRESSINGS) ×3 IMPLANT
SPONGE LAP 4X18 X RAY DECT (DISPOSABLE) ×3 IMPLANT
STRIP CLOSURE SKIN 1/2X4 (GAUZE/BANDAGES/DRESSINGS) ×2 IMPLANT
SUT MNCRL AB 4-0 PS2 18 (SUTURE) ×2 IMPLANT
SUT PROLENE 3 0 PS 2 (SUTURE) IMPLANT
SUT VIC AB 2-0 SH 27 (SUTURE)
SUT VIC AB 2-0 SH 27XBRD (SUTURE) IMPLANT
SUT VIC AB 3-0 SH 27 (SUTURE) ×3
SUT VIC AB 3-0 SH 27X BRD (SUTURE) IMPLANT
SYR BULB 3OZ (MISCELLANEOUS) IMPLANT
SYR CONTROL 10ML LL (SYRINGE) ×3 IMPLANT
TOWEL OR 17X24 6PK STRL BLUE (TOWEL DISPOSABLE) ×3 IMPLANT
TOWEL OR NON WOVEN STRL DISP B (DISPOSABLE) ×3 IMPLANT
TRAY DSU PREP LF (CUSTOM PROCEDURE TRAY) IMPLANT
TUBE CONNECTING 20'X1/4 (TUBING) ×1
TUBE CONNECTING 20X1/4 (TUBING) ×1 IMPLANT
YANKAUER SUCT BULB TIP NO VENT (SUCTIONS) IMPLANT

## 2014-06-25 NOTE — Transfer of Care (Signed)
Immediate Anesthesia Transfer of Care Note  Patient: Wendy [name]  Procedure(s) Performed: Procedure(s): EXCISION BUTTOCK MASS  (Right)  Patient Location: PACU  Anesthesia Type:General  Level of Consciousness: alert  and patient cooperative  Airway & Oxygen Therapy: Patient Spontanous Breathing and Patient connected to face mask oxygen  Post-op Assessment: Report given to RN and Post -op Vital signs reviewed and stable  Post vital signs: Reviewed and stable  Last Vitals:  Filed Vitals:   06/25/14 0818  BP: 125/58  Pulse: 81  Temp: 36.4 C  Resp: 20    Complications: No apparent anesthesia complications

## 2014-06-25 NOTE — Discharge Instructions (Signed)
Ok to shower starting tomorrow  Ice pack as needed  No vigorous activity for 1 week  Call your surgeon if you experience:   1.  Fever over 101.0. 2.  Inability to urinate. 3.  Nausea and/or vomiting. 4.  Extreme swelling or bruising at the surgical site. 5.  Continued bleeding from the incision. 6.  Increased pain, redness or drainage from the incision. 7.  Problems related to your pain medication. 8.  Any problems and/or concerns  Post Anesthesia Home Care Instructions  Activity: Get plenty of rest for the remainder of the day. A responsible adult should stay with you for 24 hours following the procedure.  For the next 24 hours, DO NOT: -Drive a car -Paediatric nurse -Drink alcoholic beverages -Take any medication unless instructed by your physician -Make any legal decisions or sign important papers.  Meals: Start with liquid foods such as gelatin or soup. Progress to regular foods as tolerated. Avoid greasy, spicy, heavy foods. If nausea and/or vomiting occur, drink only clear liquids until the nausea and/or vomiting subsides. Call your physician if vomiting continues.  Special Instructions/Symptoms: Your throat may feel dry or sore from the anesthesia or the breathing tube placed in your throat during surgery. If this causes discomfort, gargle with warm salt water. The discomfort should disappear within 24 hours.  If you had a scopolamine patch placed behind your ear for the management of post- operative nausea and/or vomiting:  1. The medication in the patch is effective for 72 hours, after which it should be removed.  Wrap patch in a tissue and discard in the trash. Wash hands thoroughly with soap and water. 2. You may remove the patch earlier than 72 hours if you experience unpleasant side effects which may include dry mouth, dizziness or visual disturbances. 3. Avoid touching the patch. Wash your hands with soap and water after contact with the patch.

## 2014-06-25 NOTE — Anesthesia Preprocedure Evaluation (Addendum)
Anesthesia Evaluation  Patient identified by MRN, date of birth, ID band Patient awake    Reviewed: Allergy & Precautions, NPO status , Patient's Chart, lab work & pertinent test results  Airway Mallampati: II  TM Distance: >3 FB Neck ROM: Full    Dental  (+) Dental Advisory Given   Pulmonary sleep apnea and Continuous Positive Airway Pressure Ventilation , COPDformer smoker,  breath sounds clear to auscultation        Cardiovascular hypertension, Pt. on medications and Pt. on home beta blockers + CAD, + Past MI, + CABG and + Peripheral Vascular Disease Rhythm:Regular Rate:Normal  05/2014 Myoview: 1. No inducible ischemia. Large scar along the inferior cardiac apex. No left ventricular dilatation. 2. Septal hypokinesis. 3. Left ventricular ejection fraction 69% 4. Low-risk stress test findings*.   Neuro/Psych Anxiety negative neurological ROS     GI/Hepatic Neg liver ROS, GERD-  ,  Endo/Other  Morbid obesity  Renal/GU negative Renal ROS     Musculoskeletal   Abdominal   Peds  Hematology negative hematology ROS (+)   Anesthesia Other Findings   Reproductive/Obstetrics                            Anesthesia Physical Anesthesia Plan  ASA: III  Anesthesia Plan: General   Post-op Pain Management:    Induction: Intravenous  Airway Management Planned: Oral ETT  Additional Equipment:   Intra-op Plan:   Post-operative Plan: Extubation in OR  Informed Consent: I have reviewed the patients History and Physical, chart, labs and discussed the procedure including the risks, benefits and alternatives for the proposed anesthesia with the patient or authorized representative who has indicated his/her understanding and acceptance.   Dental advisory given  Plan Discussed with: CRNA  Anesthesia Plan Comments:         Anesthesia Quick Evaluation

## 2014-06-25 NOTE — Anesthesia Postprocedure Evaluation (Signed)
  Anesthesia Post-op Note  Patient: Wendy [name]  Procedure(s) Performed: Procedure(s): EXCISION BUTTOCK MASS  (Right)  Patient Location: PACU  Anesthesia Type: General   Level of Consciousness: awake, alert  and oriented  Airway and Oxygen Therapy: Patient Spontanous Breathing  Post-op Pain: mild  Post-op Assessment: Post-op Vital signs reviewed  Post-op Vital Signs: Reviewed  Last Vitals:  Filed Vitals:   06/25/14 1152  BP: 116/47  Pulse: 67  Temp: 36.5 C  Resp: 20    Complications: No apparent anesthesia complications

## 2014-06-25 NOTE — Interval H&P Note (Signed)
History and Physical Interval Note:no change in H and P  06/25/2014 8:17 AM  Wendy [name]  has presented today for surgery, with the diagnosis of Right Buttock Mass  The various methods of treatment have been discussed with the patient and family. After consideration of risks, benefits and other options for treatment, the patient has consented to  Procedure(s): EXCISION BUTTOCK MASS  (N/[name]) as [name] surgical intervention .  The patient's history has been reviewed, patient examined, no change in status, stable for surgery.  I have reviewed the patient's chart and labs.  Questions were answered to the patient's satisfaction.     Wendy [name]

## 2014-06-25 NOTE — Anesthesia Procedure Notes (Signed)
Procedure Name: Intubation Date/Time: 06/25/2014 9:31 AM Performed by: Marrianne Mood Pre-anesthesia Checklist: Patient identified, Emergency Drugs available, Suction available, Patient being monitored and Timeout performed Patient Re-evaluated:Patient Re-evaluated prior to inductionOxygen Delivery Method: Circle System Utilized Preoxygenation: Pre-oxygenation with 100% oxygen Intubation Type: IV induction Ventilation: Mask ventilation without difficulty Laryngoscope Size: Miller and 3 Grade View: Grade IV Tube type: Oral Tube size: 7.0 mm Number of attempts: 1 Airway Equipment and Method: Stylet and Oral airway Placement Confirmation: ETT inserted through vocal cords under direct vision,  positive ETCO2 and breath sounds checked- equal and bilateral Secured at: 21 cm Tube secured with: Tape Dental Injury: Teeth and Oropharynx as per pre-operative assessment

## 2014-06-25 NOTE — Op Note (Signed)
EXCISION BUTTOCK MASS   Procedure Note  Wendy [name] 06/25/2014   Pre-op Diagnosis: Right Buttock Mass     Post-op Diagnosis: same  Procedure(s): EXCISION 4 CM RIGHT BUTTOCK MASS   Surgeon(s): Coralie Keens, MD  Anesthesia: General  Staff:  Circulator: Eli Phillips, RN Relief Scrub: Leroy Libman, RN Scrub Person: Tresa Res, RN  Estimated Blood Loss: Minimal               Specimens: sent to path          [facility]   Date: 06/25/2014  Time: 9:56 AM

## 2014-06-26 NOTE — Op Note (Signed)
NAMEDORTHIA, TOUT              ACCOUNT NO.:  [id number]  MEDICAL RECORD NO.:  [id number]  LOCATION:                                FACILITY:  [facility]  PHYSICIAN:  Coralie Keens, M.D. DATE OF BIRTH:  Jul 11, 1950  DATE OF PROCEDURE:  06/25/2014 DATE OF DISCHARGE:  06/25/2014                              OPERATIVE REPORT   PREOPERATIVE DIAGNOSIS:  Right buttock mass.  POSTOPERATIVE DIAGNOSIS:  Right buttock mass.  PROCEDURES:  Excision of right buttock mass.  SURGEON:  Coralie Keens, M.D.  ANESTHESIA:  General and 0.5% Marcaine.  ESTIMATED BLOOD LOSS:  Minimal.  INDICATIONS:  This is a 64 year old female who presents with a painful mass on her right buttock.  A CAT scan shows a calcified mass in the subcutaneous tissue.  Decision then made to proceed with excision of the mass.  FINDINGS:  The patient was found to have a 4 cm firm hard mass in the subcutaneous fat.  This may represent fat necrosis.  PROCEDURE IN DETAIL:  The patient was brought to the operating room, identified as Wendy [name].  She was placed supine on the operating room table, and general anesthesia was induced.  The patient was then placed in left lateral decubitus position.  Her right buttock was then prepped and draped in usual sterile fashion.  I anesthetized the skin over the mass with Marcaine and then made an incision with a scalpel.  I took this down to the mass which is deep in the subcutaneous tissue with electrocautery.  I then excised the mass in its entirety with the cautery.  The mass did not appear to be fixated to the underlying muscle.  It was quite firm and had the appearance of potential fat necrosis that had calcified.  Once the mass was excised, it was sent to Pathology for evaluation.  I anesthetized the wound further with Marcaine.  I then closed subcutaneous tissue with interrupted 3-0 Vicryl sutures and closed the skin with a running 4-0 Monocryl.  Dermabond was then applied.   The patient tolerated the procedure well.  All the counts were correct at the end of the procedure.  The patient was then extubated in the operating room and taken in stable condition to the recovery room.    Coralie Keens, M.D.    DB/MEDQ  D:  06/25/2014  T:  06/25/2014  Job:  [id number]

## 2014-06-27 ENCOUNTER — Encounter (HOSPITAL_BASED_OUTPATIENT_CLINIC_OR_DEPARTMENT_OTHER): Payer: Self-pay | Admitting: Surgery

## 2014-10-06 DIAGNOSIS — I2510 Atherosclerotic heart disease of native coronary artery without angina pectoris: Secondary | ICD-10-CM | POA: Insufficient documentation

## 2014-10-06 DIAGNOSIS — I25119 Atherosclerotic heart disease of native coronary artery with unspecified angina pectoris: Secondary | ICD-10-CM | POA: Insufficient documentation

## 2015-01-14 DIAGNOSIS — I10 Essential (primary) hypertension: Secondary | ICD-10-CM | POA: Insufficient documentation

## 2015-01-21 ENCOUNTER — Encounter (HOSPITAL_COMMUNITY): Payer: Self-pay | Admitting: Student

## 2015-01-21 ENCOUNTER — Inpatient Hospital Stay (HOSPITAL_COMMUNITY)
Admission: EM | Admit: 2015-01-21 | Discharge: 2015-01-23 | DRG: 247 | Disposition: A | Discharge status: Home / Self Care | Payer: Medicare Other | Attending: Cardiovascular Disease | Admitting: Cardiovascular Disease

## 2015-01-21 ENCOUNTER — Emergency Department (HOSPITAL_COMMUNITY): Payer: Medicare Other

## 2015-01-21 DIAGNOSIS — I252 Old myocardial infarction: Secondary | ICD-10-CM | POA: Diagnosis not present

## 2015-01-21 DIAGNOSIS — Z7902 Long term (current) use of antithrombotics/antiplatelets: Secondary | ICD-10-CM

## 2015-01-21 DIAGNOSIS — Z888 Allergy status to other drugs, medicaments and biological substances status: Secondary | ICD-10-CM

## 2015-01-21 DIAGNOSIS — Y831 Surgical operation with implant of artificial internal device as the cause of abnormal reaction of the patient, or of later complication, without mention of misadventure at the time of the procedure: Secondary | ICD-10-CM | POA: Diagnosis present

## 2015-01-21 DIAGNOSIS — Z881 Allergy status to other antibiotic agents status: Secondary | ICD-10-CM | POA: Diagnosis not present

## 2015-01-21 DIAGNOSIS — K219 Gastro-esophageal reflux disease without esophagitis: Secondary | ICD-10-CM | POA: Diagnosis present

## 2015-01-21 DIAGNOSIS — I25110 Atherosclerotic heart disease of native coronary artery with unstable angina pectoris: Secondary | ICD-10-CM | POA: Diagnosis present

## 2015-01-21 DIAGNOSIS — I200 Unstable angina: Secondary | ICD-10-CM | POA: Diagnosis not present

## 2015-01-21 DIAGNOSIS — J449 Chronic obstructive pulmonary disease, unspecified: Secondary | ICD-10-CM | POA: Diagnosis present

## 2015-01-21 DIAGNOSIS — I25700 Atherosclerosis of coronary artery bypass graft(s), unspecified, with unstable angina pectoris: Secondary | ICD-10-CM | POA: Diagnosis not present

## 2015-01-21 DIAGNOSIS — I480 Paroxysmal atrial fibrillation: Secondary | ICD-10-CM | POA: Diagnosis present

## 2015-01-21 DIAGNOSIS — Z951 Presence of aortocoronary bypass graft: Secondary | ICD-10-CM | POA: Diagnosis not present

## 2015-01-21 DIAGNOSIS — I739 Peripheral vascular disease, unspecified: Secondary | ICD-10-CM | POA: Insufficient documentation

## 2015-01-21 DIAGNOSIS — I4891 Unspecified atrial fibrillation: Secondary | ICD-10-CM

## 2015-01-21 DIAGNOSIS — N179 Acute kidney failure, unspecified: Secondary | ICD-10-CM | POA: Diagnosis present

## 2015-01-21 DIAGNOSIS — G629 Polyneuropathy, unspecified: Secondary | ICD-10-CM | POA: Diagnosis present

## 2015-01-21 DIAGNOSIS — Z79899 Other long term (current) drug therapy: Secondary | ICD-10-CM

## 2015-01-21 DIAGNOSIS — T82855A Stenosis of coronary artery stent, initial encounter: Principal | ICD-10-CM | POA: Diagnosis present

## 2015-01-21 DIAGNOSIS — F419 Anxiety disorder, unspecified: Secondary | ICD-10-CM | POA: Diagnosis present

## 2015-01-21 DIAGNOSIS — M549 Dorsalgia, unspecified: Secondary | ICD-10-CM | POA: Diagnosis present

## 2015-01-21 DIAGNOSIS — Z91048 Other nonmedicinal substance allergy status: Secondary | ICD-10-CM | POA: Diagnosis not present

## 2015-01-21 DIAGNOSIS — E785 Hyperlipidemia, unspecified: Secondary | ICD-10-CM | POA: Diagnosis present

## 2015-01-21 DIAGNOSIS — G8929 Other chronic pain: Secondary | ICD-10-CM | POA: Diagnosis present

## 2015-01-21 DIAGNOSIS — I10 Essential (primary) hypertension: Secondary | ICD-10-CM | POA: Diagnosis present

## 2015-01-21 HISTORY — DX: Paroxysmal atrial fibrillation: I48.0

## 2015-01-21 LAB — CBC
HCT: 37.5 % (ref 36.0–46.0)
Hemoglobin: 11.7 g/dL — ABNORMAL LOW (ref 12.0–15.0)
MCH: 26.1 pg (ref 26.0–34.0)
MCHC: 31.2 g/dL (ref 30.0–36.0)
MCV: 83.7 fL (ref 78.0–100.0)
Platelets: 238 10*3/uL (ref 150–400)
RBC: 4.48 MIL/uL (ref 3.87–5.11)
RDW: 17.5 % — ABNORMAL HIGH (ref 11.5–15.5)
WBC: 8.1 10*3/uL (ref 4.0–10.5)

## 2015-01-21 LAB — BASIC METABOLIC PANEL
Anion gap: 12 (ref 5–15)
BUN: 15 mg/dL (ref 6–20)
CO2: 27 mmol/L (ref 22–32)
Calcium: 9.3 mg/dL (ref 8.9–10.3)
Chloride: 100 mmol/L — ABNORMAL LOW (ref 101–111)
Creatinine, Ser: 1.17 mg/dL — ABNORMAL HIGH (ref 0.44–1.00)
GFR calc Af Amer: 56 mL/min — ABNORMAL LOW (ref >60)
GFR calc non Af Amer: 49 mL/min — ABNORMAL LOW (ref >60)
Glucose, Bld: 91 mg/dL (ref 65–99)
Potassium: 3.7 mmol/L (ref 3.5–5.1)
Sodium: 139 mmol/L (ref 135–145)

## 2015-01-21 LAB — I-STAT TROPONIN, ED: Troponin i, poc: 0.01 ng/mL (ref 0.00–0.08)

## 2015-01-21 LAB — TROPONIN I: Troponin I: <0.03 ng/mL (ref <0.031)

## 2015-01-21 MED ORDER — ONDANSETRON HCL 4 MG/2ML IJ SOLN: 4.0000 mg | Freq: Four times a day (QID) | INTRAMUSCULAR | Status: DC | PRN

## 2015-01-21 MED ORDER — ASPIRIN EC 81 MG PO TBEC: 81.0000 mg | DELAYED_RELEASE_TABLET | Freq: Every day | ORAL | Status: DC

## 2015-01-21 MED ORDER — ISOSORBIDE MONONITRATE ER 60 MG PO TB24
120.0000 mg | ORAL_TABLET | Freq: Every day | ORAL | Status: DC
  Administered 2015-01-23: 09:00:00 120 mg via ORAL
  Filled 2015-01-21 (×2): qty 2

## 2015-01-21 MED ORDER — METOPROLOL TARTRATE 12.5 MG HALF TABLET
12.5000 mg | ORAL_TABLET | Freq: Three times a day (TID) | ORAL | Status: DC
  Administered 2015-01-21 – 2015-01-23 (×3): 12.5 mg via ORAL
  Filled 2015-01-21 (×3): qty 1

## 2015-01-21 MED ORDER — PANTOPRAZOLE SODIUM 40 MG PO TBEC
40.0000 mg | DELAYED_RELEASE_TABLET | Freq: Every day | ORAL | Status: DC
  Administered 2015-01-22 – 2015-01-23 (×2): 40 mg via ORAL
  Filled 2015-01-21 (×2): qty 1

## 2015-01-21 MED ORDER — SIMVASTATIN 20 MG PO TABS
20.0000 mg | ORAL_TABLET | Freq: Every day | ORAL | Status: DC
Start: 2015-01-22 — End: 2015-01-22

## 2015-01-21 MED ORDER — BECLOMETHASONE DIPROPIONATE 80 MCG/ACT IN AERS: 2.0000 | INHALATION_SPRAY | Freq: Two times a day (BID) | RESPIRATORY_TRACT | Status: DC | PRN

## 2015-01-21 MED ORDER — BUDESONIDE 0.25 MG/2ML IN SUSP
0.2500 mg | Freq: Two times a day (BID) | RESPIRATORY_TRACT | Status: DC | PRN
  Filled 2015-01-21: qty 2

## 2015-01-21 MED ORDER — SERTRALINE HCL 50 MG PO TABS
100.0000 mg | ORAL_TABLET | Freq: Every day | ORAL | Status: DC
  Administered 2015-01-22 – 2015-01-23 (×2): 100 mg via ORAL
  Filled 2015-01-21 (×2): qty 2

## 2015-01-21 MED ORDER — SODIUM CHLORIDE 0.9 % WEIGHT BASED INFUSION
3.0000 mL/kg/h | INTRAVENOUS | Status: DC
  Administered 2015-01-22: 3 mL/kg/h via INTRAVENOUS

## 2015-01-21 MED ORDER — ACETAMINOPHEN 325 MG PO TABS: 650.0000 mg | ORAL_TABLET | ORAL | Status: DC | PRN

## 2015-01-21 MED ORDER — SODIUM CHLORIDE 0.9 % IV BOLUS (SEPSIS)
500.0000 mL | Freq: Once | INTRAVENOUS | Status: AC
  Administered 2015-01-21: 500 mL via INTRAVENOUS

## 2015-01-21 MED ORDER — ALBUTEROL SULFATE (2.5 MG/3ML) 0.083% IN NEBU
2.5000 mg | INHALATION_SOLUTION | Freq: Four times a day (QID) | RESPIRATORY_TRACT | Status: DC | PRN
Start: 2015-01-21 — End: 2015-01-23

## 2015-01-21 MED ORDER — HEPARIN (PORCINE) IN NACL 100-0.45 UNIT/ML-% IJ SOLN
1150.0000 [IU]/h | INTRAMUSCULAR | Status: DC
  Administered 2015-01-21: 950 [IU]/h via INTRAVENOUS
  Filled 2015-01-21: qty 250

## 2015-01-21 MED ORDER — EZETIMIBE 10 MG PO TABS
10.0000 mg | ORAL_TABLET | Freq: Every day | ORAL | Status: DC
  Administered 2015-01-22 – 2015-01-23 (×2): 10 mg via ORAL
  Filled 2015-01-21 (×2): qty 1

## 2015-01-21 MED ORDER — CLOPIDOGREL BISULFATE 75 MG PO TABS: 75.0000 mg | ORAL_TABLET | Freq: Every day | ORAL | Status: DC

## 2015-01-21 MED ORDER — DILTIAZEM HCL 25 MG/5ML IV SOLN
10.0000 mg | Freq: Once | INTRAVENOUS | Status: DC
  Filled 2015-01-21: qty 5

## 2015-01-21 MED ORDER — ALBUTEROL SULFATE HFA 108 (90 BASE) MCG/ACT IN AERS: 2.0000 | INHALATION_SPRAY | Freq: Four times a day (QID) | RESPIRATORY_TRACT | Status: DC | PRN

## 2015-01-21 MED ORDER — SODIUM CHLORIDE 0.9 % IV SOLN: 250.0000 mL | INTRAVENOUS | Status: DC | PRN

## 2015-01-21 MED ORDER — PROMETHAZINE HCL 25 MG PO TABS: 12.5000 mg | ORAL_TABLET | Freq: Four times a day (QID) | ORAL | Status: DC | PRN

## 2015-01-21 MED ORDER — HEPARIN BOLUS VIA INFUSION
4000.0000 [IU] | Freq: Once | INTRAVENOUS | Status: AC
  Administered 2015-01-21: 4000 [IU] via INTRAVENOUS
  Filled 2015-01-21: qty 4000

## 2015-01-21 MED ORDER — MORPHINE SULFATE (PF) 4 MG/ML IV SOLN
4.0000 mg | Freq: Once | INTRAVENOUS | Status: AC
  Administered 2015-01-21: 4 mg via INTRAVENOUS
  Filled 2015-01-21: qty 1

## 2015-01-21 MED ORDER — PREGABALIN 100 MG PO CAPS
200.0000 mg | ORAL_CAPSULE | Freq: Every day | ORAL | Status: DC
Start: 2015-01-21 — End: 2015-01-23
  Administered 2015-01-21 – 2015-01-23 (×3): 200 mg via ORAL
  Filled 2015-01-21 (×2): qty 2
  Filled 2015-01-21: qty 4

## 2015-01-21 MED ORDER — NITROGLYCERIN 0.4 MG SL SUBL: 0.4000 mg | SUBLINGUAL_TABLET | SUBLINGUAL | Status: DC | PRN

## 2015-01-21 MED ORDER — SODIUM CHLORIDE 0.9 % WEIGHT BASED INFUSION
1.0000 mL/kg/h | INTRAVENOUS | Status: DC
Start: 2015-01-22 — End: 2015-01-22
  Administered 2015-01-22: 1 mL/kg/h via INTRAVENOUS

## 2015-01-21 MED ORDER — ASPIRIN 81 MG PO CHEW
81.0000 mg | CHEWABLE_TABLET | ORAL | Status: AC
  Administered 2015-01-22: 81 mg via ORAL
  Filled 2015-01-21: qty 1

## 2015-01-21 MED ORDER — SODIUM CHLORIDE 0.9 % IJ SOLN
3.0000 mL | Freq: Two times a day (BID) | INTRAMUSCULAR | Status: DC
  Administered 2015-01-21: 3 mL via INTRAVENOUS

## 2015-01-21 MED ORDER — RANOLAZINE ER 500 MG PO TB12
1000.0000 mg | ORAL_TABLET | Freq: Two times a day (BID) | ORAL | Status: DC
  Administered 2015-01-23: 1000 mg via ORAL
  Filled 2015-01-21 (×2): qty 2

## 2015-01-21 MED ORDER — LORATADINE 10 MG PO TABS
10.0000 mg | ORAL_TABLET | Freq: Every day | ORAL | Status: DC
  Administered 2015-01-21 – 2015-01-23 (×3): 10 mg via ORAL
  Filled 2015-01-21 (×2): qty 1

## 2015-01-21 MED ORDER — MORPHINE SULFATE (PF) 2 MG/ML IV SOLN
2.0000 mg | INTRAVENOUS | Status: DC | PRN
  Administered 2015-01-21 – 2015-01-22 (×2): 2 mg via INTRAVENOUS
  Filled 2015-01-21 (×2): qty 1

## 2015-01-21 MED ORDER — SODIUM CHLORIDE 0.9 % IJ SOLN: 3.0000 mL | INTRAMUSCULAR | Status: DC | PRN

## 2015-01-21 MED ORDER — ONDANSETRON HCL 4 MG/2ML IJ SOLN
4.0000 mg | Freq: Once | INTRAMUSCULAR | Status: AC
  Administered 2015-01-21: 4 mg via INTRAVENOUS
  Filled 2015-01-21: qty 2

## 2015-01-21 NOTE — Progress Notes (Signed)
ANTICOAGULATION CONSULT NOTE - Initial Consult  Pharmacy Consult for Heparin Indication:   ACS/STEMI   Allergies  Allergen Reactions  . Benadryl [Diphenhydramine] Shortness Of Breath  . Adhesive [Tape]     Burning of skin  . Amoxicillin Nausea And Vomiting    Severe diarrhia  . Flexeril [Cyclobenzaprine] Other (See Comments)    States that it messes with her blood circulation; pt states that she has to walk around    Patient Measurements: Height: 5' 2" (157.5 cm) Weight: 185 lb (83.915 kg) IBW/kg (Calculated) : 50.1 Heparin Dosing Weight: 69 kg  Vital Signs: Temp: 97.6 F (36.4 C) (11/16 2024) Temp Source: Oral (11/16 2024) BP: 101/77 mmHg (11/16 2024) Pulse Rate: 119 (11/16 2024)  Labs:  Recent Labs  01/21/15 1509  HGB 11.7*  HCT 37.5  PLT 238  CREATININE 1.17*    Estimated Creatinine Clearance: 49.4 mL/min (by C-G formula based on Cr of 1.17).   Medical History: Past Medical History  Diagnosis Date  . Glucose intolerance (impaired glucose tolerance)   . Hypertension   . PAD (peripheral artery disease) ([place])   . COPD (chronic obstructive pulmonary disease) ([place])   . Peripheral neuropathy ([place])   . Chronic back pain   . Chronic leg pain   . CAD (coronary artery disease), native coronary artery     a. Hx CABG 1998, last PCI 2013 b.LHC 07/2013:  EF 55-60%, L-LAD prob atretic, no sig disease in LAD, S-OM1 ok with patent stent, S-dRCA ok => med Rx.  Marland Kitchen HLD (hyperlipidemia)   . Myocardial infarction ([place])   . Shortness of breath     when walking  . Anxiety   . GERD (gastroesophageal reflux disease)   . Wears glasses   . Sleep apnea     uses a cpap  . PAF (paroxysmal atrial fibrillation) ([place])     a. 09/2013 post-op b. 01/2015    Medications:  Prescriptions prior to admission  Medication Sig Dispense Refill Last Dose  . ADVAIR DISKUS 250-50 MCG/DOSE AEPB Inhale 1 puff into the lungs every 12 (twelve) hours as needed. Wheezing  3 Past Week at Unknown time   . albuterol (PROVENTIL HFA;VENTOLIN HFA) 108 (90 BASE) MCG/ACT inhaler Inhale 2 puffs into the lungs every 6 (six) hours as needed for wheezing or shortness of breath.   rescue at rescue  . ALPRAZolam (XANAX) 1 MG tablet Take 1-2 mg by mouth at bedtime as needed for anxiety or sleep.   Past Week at Unknown time  . beclomethasone (QVAR) 80 MCG/ACT inhaler Inhale 2 puffs into the lungs 2 (two) times daily as needed (shortness of breath).    Past Week at Unknown time  . cetirizine (ZYRTEC) 10 MG tablet Take 10 mg by mouth daily as needed for allergies.    Past Week at Unknown time  . clopidogrel (PLAVIX) 75 MG tablet Take 75 mg by mouth daily.   01/21/2015 at Unknown time  . Cyanocobalamin 1000 MCG/ML KIT Inject 1,000 mcg as directed every 30 (thirty) days. 15th of each month   Past Month at Unknown time  . ezetimibe (ZETIA) 10 MG tablet Take 10 mg by mouth daily.   01/21/2015 at Unknown time  . Fish Oil-Cholecalciferol (FISH OIL + D3 PO) Take 1 capsule by mouth daily.   01/21/2015 at Unknown time  . isosorbide mononitrate (IMDUR) 120 MG 24 hr tablet Take 240 mg by mouth daily.    01/21/2015 at Unknown time  . nitroGLYCERIN (NITROSTAT) 0.4 MG  SL tablet Place 0.4 mg under the tongue every 5 (five) minutes as needed for chest pain.   01/21/2015 at Unknown time  . omeprazole (PRILOSEC) 40 MG capsule Take 40 mg by mouth daily.    01/21/2015 at Unknown time  . pregabalin (LYRICA) 200 MG capsule Take 200 mg by mouth daily.   01/21/2015 at Unknown time  . promethazine (PHENERGAN) 12.5 MG tablet Take 1-2 tablets (12.5-25 mg total) by mouth every 6 (six) hours as needed for nausea. 20 tablet 3 Past Week at Unknown time  . ranolazine (RANEXA) 1000 MG SR tablet Take 1,000 mg by mouth 2 (two) times daily.   01/21/2015 at Unknown time  . sertraline (ZOLOFT) 100 MG tablet Take 100 mg by mouth daily.   01/21/2015 at Unknown time  . simvastatin (ZOCOR) 20 MG tablet Take 20 mg by mouth daily.   01/21/2015 at Unknown  time  . triamterene-hydrochlorothiazide (MAXZIDE-25) 37.5-25 MG per tablet Take 1 tablet by mouth daily.   01/21/2015 at Unknown time  . atorvastatin (LIPITOR) 10 MG tablet Take 1 tablet (10 mg total) by mouth at bedtime. (Patient not taking: Reported on 05/12/2014) 30 tablet 3 Not Taking at Unknown time  . docusate sodium 100 MG CAPS Take 100 mg by mouth 2 (two) times daily. (Patient not taking: Reported on 05/12/2014) 20 capsule 1 Not Taking at Unknown time  . metoprolol (LOPRESSOR) 100 MG tablet Take 1 tablet (100 mg total) by mouth 2 (two) times daily. (Patient not taking: Reported on 01/21/2015)   Not Taking at Unknown time  . ondansetron (ZOFRAN ODT) 4 MG disintegrating tablet Take 1 tablet (4 mg total) by mouth every 8 (eight) hours as needed for nausea or vomiting. (Patient not taking: Reported on 05/12/2014) 30 tablet 1 Not Taking at Unknown time  . oxyCODONE-acetaminophen (ROXICET) 5-325 MG per tablet Take 1-2 tablets by mouth every 4 (four) hours as needed for severe pain. (Patient not taking: Reported on 01/21/2015) 50 tablet 0 Completed Course at Unknown time   Scheduled:  . [START ON 01/22/2015] aspirin EC  81 mg Oral Daily  . [START ON 01/22/2015] clopidogrel  75 mg Oral Daily  . [START ON 01/22/2015] ezetimibe  10 mg Oral Daily  . [START ON 01/22/2015] isosorbide mononitrate  120 mg Oral Daily  . loratadine  10 mg Oral Daily  . metoprolol tartrate  12.5 mg Oral TID  . [START ON 01/22/2015] pantoprazole  40 mg Oral Daily  . pregabalin  200 mg Oral Daily  . ranolazine  1,000 mg Oral BID  . [START ON 01/22/2015] sertraline  100 mg Oral Daily  . [START ON 01/22/2015] simvastatin  20 mg Oral Daily    Assessment: 64 y.o  female with past medical history of CAD (s/p CABG SVG-OM, LIMA-LAD, SVG-RCA in 1998, PCI in 2013), HTN, HLD, PAD, COPD, and GERD who presents to [facility] ED on 01/21/2015 for worsening chest discomfort. She was also noted to be in atrial fibrillation with rate in the  140's upon arrival to the ED. Pharmacy consulted to start IV heparin for  ACS/STEMI. Cardiologist also notes PAF and that will need to consider NOAC for long-term anticoagulation. Hgb 11.7, pltc 238k, no bleeding noted.   Goal of Therapy:  Heparin level 0.3-0.7 units/ml Monitor platelets by anticoagulation protocol: Yes   Plan:  Heparin bolus 4000 IV x1  Heparin drip rate at 950 units/hr Heparin level 6 hours after heparin started Daily heparin level and CBC   Thank you  for allowing pharmacy to be part of this patients care team. Nicole Cella, [place] Clinical Pharmacist Pager: [phone] 01/21/2015,8:36 PM

## 2015-01-21 NOTE — ED Notes (Signed)
MD bedside

## 2015-01-21 NOTE — ED Notes (Signed)
Pt. Is very sleepy, she took a Xanax this morning

## 2015-01-21 NOTE — ED Notes (Signed)
Attempted to call report

## 2015-01-21 NOTE — ED Notes (Signed)
MD at bedside. 

## 2015-01-21 NOTE — ED Provider Notes (Addendum)
CSN: [id number]     Arrival date & time 01/21/15  1339 History   First MD Initiated Contact with Patient 01/21/15 1443     Chief Complaint  Patient presents with  . Irregular Heart Beat     (Consider location/radiation/quality/duration/timing/severity/associated sxs/prior Treatment) HPI Comments: Patient is a 64 year old female with a history of hypertension, coronary artery disease status post CABG and stenting. Last stents placed in 2015 who presents today with one week of intermittent palpitations, chest pain and shortness of breath. Today patient was experiencing no symptoms and when she took her blood pressure was 140. Family finally convinced her to come to the ER. Patient took nitroglycerin prior to coming as well as aspirin with improvement of her symptoms. Currently she states she just doesn't feel well. She denies any recent weight gain, leg swelling or unilateral calf pain and swelling. No recent surgeries, immobilization or warm and replacement. She has not changed her medication recently. No fever, productive cough, congestion or rhinorrhea.  The history is provided by the patient.    Past Medical History  Diagnosis Date  . Glucose intolerance (impaired glucose tolerance)   . Hypertension   . PAD (peripheral artery disease)   . COPD (chronic obstructive pulmonary disease)   . Peripheral neuropathy   . Chronic back pain   . Chronic leg pain   . CAD (coronary artery disease), native coronary artery     Hx CABG 1998, last PCI 2013; LHC 07/2013:  EF 55-60%, L-LAD prob atretic, no sig disease in LAD, S-OM1 ok with patent stent, S-dRCA ok => med Rx.  Marland Kitchen HLD (hyperlipidemia)   . Myocardial infarction ([place])   . Shortness of breath     when walking  . Anxiety   . GERD (gastroesophageal reflux disease)   . Wears glasses   . Sleep apnea     uses a cpap   Past Surgical History  Procedure Laterality Date  . Cardiac surgery  1998    LIMA-LAD, SVG-OM, SVG-RCA  . Coronary  angioplasty with stent placement  11/2011, 02/2012    DES to SVG-RCA both times, In [place], [place], Dr. Bruce Donath  . Cervical spine surgery  1991 and again several years later  . Knee arthroscopy Right 1998  . Finger surgery Right     ring finger  . Orif humerus fracture Left 08/27/2013    Procedure: OPEN REDUCTION INTERNAL FIXATION (ORIF) LEFT HUMERUS ;  Surgeon: Rozanna Box, MD;  Location: [place];  Service: Orthopedics;  Laterality: Left;  . Cholecystectomy  09/17/2013    dr [name]  . Cholecystectomy N/A 09/16/2013    Procedure: LAPAROSCOPIC CHOLECYSTECTOMY CONVERTED TO OPEN CHOLECYSTECTOMY WITH CHOLANGIOGRAM;  Surgeon: [name], MD;  Location: [place];  Service: General;  Laterality: N/A;  . Ercp N/A 09/19/2013    Procedure: ENDOSCOPIC RETROGRADE CHOLANGIOPANCREATOGRAPHY (ERCP);  Surgeon: [name], MD;  Location: [place];  Service: Endoscopy;  Laterality: N/A;  . I and d right subcostal wound  10/17/13  . Irrigation and debridement abscess Right 10/17/2013    Procedure: INCISION AND DRAINAGE RIGHT SUBCOSTAL WOUND;  Surgeon: [name], MD;  Location: [facility];  Service: General;  Laterality: Right;  . Left heart catheterization with coronary/graft angiogram N/A 07/19/2013    Procedure: LEFT HEART CATHETERIZATION WITH Beatrix Fetters;  Surgeon: Leonie Man, MD;  Location: [facility] C[facility];  Service: Cardiovascular;  Laterality: N/A;  . Mass excision Right 06/25/2014    Procedure: EXCISION BUTTOCK MASS ;  Surgeon:  Coralie Keens, MD;  Location: [place];  Service: General;  Laterality: Right;   Family History  Problem Relation Age of Onset  . Lung cancer Mother    Social History  Substance Use Topics  . Smoking status: Former Smoker    Quit date: 07/20/1998  . Smokeless tobacco: Never Used  . Alcohol Use: No   OB History    No data available     Review of Systems  All other systems reviewed and are negative.     Allergies   Benadryl; Adhesive; Amoxicillin; and Flexeril  Home Medications   Prior to Admission medications   Medication Sig Start Date End Date Taking? Authorizing Provider  ADVAIR DISKUS 250-50 MCG/DOSE AEPB Inhale 1 puff into the lungs every 12 (twelve) hours as needed. Wheezing 12/30/14  Yes Historical Provider, MD  albuterol (PROVENTIL HFA;VENTOLIN HFA) 108 (90 BASE) MCG/ACT inhaler Inhale 2 puffs into the lungs every 6 (six) hours as needed for wheezing or shortness of breath.   Yes Historical Provider, MD  ALPRAZolam Duanne Moron) 1 MG tablet Take 1-2 mg by mouth at bedtime as needed for anxiety or sleep.   Yes Historical Provider, MD  beclomethasone (QVAR) 80 MCG/ACT inhaler Inhale 2 puffs into the lungs 2 (two) times daily as needed (shortness of breath).    Yes Historical Provider, MD  cetirizine (ZYRTEC) 10 MG tablet Take 10 mg by mouth daily as needed for allergies.    Yes Historical Provider, MD  clopidogrel (PLAVIX) 75 MG tablet Take 75 mg by mouth daily.   Yes Historical Provider, MD  Cyanocobalamin 1000 MCG/ML KIT Inject 1,000 mcg as directed every 30 (thirty) days. 15th of each month   Yes Historical Provider, MD  ezetimibe (ZETIA) 10 MG tablet Take 10 mg by mouth daily.   Yes Historical Provider, MD  Fish Oil-Cholecalciferol (FISH OIL + D3 PO) Take 1 capsule by mouth daily.   Yes Historical Provider, MD  isosorbide mononitrate (IMDUR) 120 MG 24 hr tablet Take 240 mg by mouth daily.    Yes Historical Provider, MD  nitroGLYCERIN (NITROSTAT) 0.4 MG SL tablet Place 0.4 mg under the tongue every 5 (five) minutes as needed for chest pain.   Yes Historical Provider, MD  omeprazole (PRILOSEC) 40 MG capsule Take 40 mg by mouth daily.    Yes Historical Provider, MD  pregabalin (LYRICA) 200 MG capsule Take 200 mg by mouth daily.   Yes Historical Provider, MD  promethazine (PHENERGAN) 12.5 MG tablet Take 1-2 tablets (12.5-25 mg total) by mouth every 6 (six) hours as needed for nausea. 11/04/13  Yes Coralie Keens, MD  ranolazine (RANEXA) 1000 MG SR tablet Take 1,000 mg by mouth 2 (two) times daily.   Yes Historical Provider, MD  sertraline (ZOLOFT) 100 MG tablet Take 100 mg by mouth daily.   Yes Historical Provider, MD  simvastatin (ZOCOR) 20 MG tablet Take 20 mg by mouth daily.   Yes Historical Provider, MD  triamterene-hydrochlorothiazide (MAXZIDE-25) 37.5-25 MG per tablet Take 1 tablet by mouth daily.   Yes Historical Provider, MD  atorvastatin (LIPITOR) 10 MG tablet Take 1 tablet (10 mg total) by mouth at bedtime. Patient not taking: Reported on 05/12/2014 09/21/13   Ripudeep Krystal Eaton, MD  docusate sodium 100 MG CAPS Take 100 mg by mouth 2 (two) times daily. Patient not taking: Reported on 05/12/2014 08/28/13   Ainsley Spinner, PA-C  metoprolol (LOPRESSOR) 100 MG tablet Take 1 tablet (100 mg total) by mouth 2 (two) times  daily. Patient not taking: Reported on 01/21/2015 09/22/13   Ripudeep Krystal Eaton, MD  ondansetron (ZOFRAN ODT) 4 MG disintegrating tablet Take 1 tablet (4 mg total) by mouth every 8 (eight) hours as needed for nausea or vomiting. Patient not taking: Reported on 05/12/2014 10/22/13   Saverio Danker, PA-C  oxyCODONE-acetaminophen (ROXICET) 5-325 MG per tablet Take 1-2 tablets by mouth every 4 (four) hours as needed for severe pain. Patient not taking: Reported on 01/21/2015 06/25/14   Coralie Keens, MD   BP 101/67 mmHg  Pulse 120  Temp(Src) 98.6 F (37 C) (Oral)  Resp 18  Ht $R'5\' 2"'lI$  (1.575 m)  Wt 185 lb (83.915 kg)  BMI 33.83 kg/m2  SpO2 94% Physical Exam  Constitutional: She is oriented to person, place, and time. She appears well-developed and well-nourished. No distress.  HENT:  Head: Normocephalic and atraumatic.  Mouth/Throat: Oropharynx is clear and moist.  Eyes: Conjunctivae and EOM are normal. Pupils are equal, round, and reactive to light.  Neck: Normal range of motion. Neck supple.  Cardiovascular: Intact distal pulses.  An irregularly irregular rhythm present. Tachycardia  present.   No murmur heard. Pulmonary/Chest: Effort normal and breath sounds normal. No respiratory distress. She has no wheezes. She has no rales.  Abdominal: Soft. She exhibits no distension. There is no tenderness. There is no rebound and no guarding.  Musculoskeletal: Normal range of motion. She exhibits no edema or tenderness.  Neurological: She is alert and oriented to person, place, and time.  Skin: Skin is warm and dry. No rash noted. No erythema.  Psychiatric: She has a normal mood and affect. Her behavior is normal.  Nursing note and vitals reviewed.   ED Course  Procedures (including critical care time) Labs Review Labs Reviewed  BASIC METABOLIC PANEL - Abnormal; Notable for the following:    Chloride 100 (*)    Creatinine, Ser 1.17 (*)    GFR calc non Af Amer 49 (*)    GFR calc Af Amer 56 (*)    All other components within normal limits  CBC - Abnormal; Notable for the following:    Hemoglobin 11.7 (*)    RDW 17.5 (*)    All other components within normal limits  I-STAT TROPOININ, ED    Imaging Review Dg Chest 2 View  01/21/2015  CLINICAL DATA:  chest discomfort that radiates into her jaw. She Also is having A--fib. Hx of BP- on meds, Hx of heart surgery 1998; coronary stent placement 2013. EXAM: CHEST  2 VIEW COMPARISON:  05/12/2014 FINDINGS: Prior median sternotomy. Left proximal femoral fixation. Midline trachea. Normal heart size. No pleural effusion or pneumothorax. Clear lungs. IMPRESSION: No acute cardiopulmonary disease. Electronically Signed   By: Abigail Miyamoto M.D.   On: 01/21/2015 15:37   I have personally reviewed and evaluated these images and lab results as part of my medical decision-making.   EKG Interpretation   Date/Time:  Wednesday January 21 2015 14:05:05 EST Ventricular Rate:  120 PR Interval:    QRS Duration: 93 QT Interval:  292 QTC Calculation: 412 R Axis:   45 Text Interpretation:  new Atrial flutter with 2:1 AV block Consider   inferior infarct Probable anterior infarct, age indeterminate Repol abnrm,  severe global ischemia (LM/MVD) Confirmed by Maryan Rued  MD, Miriah Maruyama ([id number])  on 01/21/2015 3:41:01 PM      MDM   Final diagnoses:  Atrial fibrillation, unspecified type [facility])    Patient is a 64 year old female with significant heart history who presents  today with one week of worsening palpitations, intermittent shortness of breath and chest discomfort. Patient took nitroglycerin and aspirin prior to arrival with improvement in symptoms however upon arrival here she was in atrial fibrillation with intermittent rhythm from 100-140. She denies any recent infectious symptoms and has continued to take all of her medications as prescribed. On exam she has no evidence of wheezing, peripheral edema concerning for CHF. After getting nitroglycerin her blood pressure is 39Q systolic but she denies any lightheadedness, near-syncope or syncope.  Labs are within normal limits except for mild acute kidney injury with a creatinine of 1.17 from her baseline of 0.8. Will give patient an IV bolus to see if that improves her heart rate and blood pressure.   4:02 PM After 500 mL bolus patient's blood pressures improved to 110. Cardiology to common evaluate the patient.  Blanchie Dessert, MD 01/21/15 [place], MD 01/21/15 [place], MD 01/21/15 [phone]

## 2015-01-21 NOTE — H&P (Signed)
Patient ID: Wendy [name] MRN: [id number], DOB/AGE: [birth year]   Admit date: 01/21/2015   Primary Physician: Daphene Jaeger, PA-C Primary Cardiologist: Dr. [name] (Tia Alert)   Wendy [name] is a 64 y.o. female with past medical history of CAD (s/p CABG SVG-OM, LIMA-LAD, SVG-RCA in  1998, PCI in 2013), HTN, HLD, PAD, COPD, and GERD who presents to [facility] ED on 01/21/2015 for worsening chest discomfort. She was also noted to be in atrial fibrillation with rate in the 140's upon arrival to the ED.  Patient reports she has been having chest discomfort for the past several months which radiates into her neck and jaw. The pressure is worse with activity and relieved with rest. She reports it use to resolve with taking 1 SL NTG, but now she has to take 5 SL NTG's to have relief. She reports associated dyspnea but denies any diaphoresis, nausea, or vomiting.   She follows closely with Dr. [name] in [place] and was recently seen in 10/2014. She was still having unstable angina at that time. He increased her Ranexa to 1,096m BID and her Imdur to 1234mBID. She had been on Metoprolol previously but says she quit taking it one year ago just because she "didn't think she needed it anymore". Her angina has persisted since that visit and medication dose adjustments. Of note, she was in NSR at the time of that visit.  She has had one known previous episode of atrial fibrillation with RVR in 09/2013 following a laparoscopic cholecystectomy.  She underwent cardiac catheterization here in 07/2013 which showed an atretic LIMA-LAD with no significant disease in the native LAD, patent SVG-OM with patent stent and patent SVG-distRCA.  Had normal Exercise Tolerance test in 05/2014.  Reports feeling depressed recently with the passing of her sister and grandson earlier this year.   Home Medications  Prior to Admission medications   Medication Sig Start Date End Date Taking? Authorizing Provider    ADVAIR DISKUS 250-50 MCG/DOSE AEPB Inhale 1 puff into the lungs every 12 (twelve) hours as needed. Wheezing 12/30/14  Yes Historical Provider, MD  albuterol (PROVENTIL HFA;VENTOLIN HFA) 108 (90 BASE) MCG/ACT inhaler Inhale 2 puffs into the lungs every 6 (six) hours as needed for wheezing or shortness of breath.   Yes Historical Provider, MD  ALPRAZolam (XDuanne Moron1 MG tablet Take 1-2 mg by mouth at bedtime as needed for anxiety or sleep.   Yes Historical Provider, MD  beclomethasone (QVAR) 80 MCG/ACT inhaler Inhale 2 puffs into the lungs 2 (two) times daily as needed (shortness of breath).    Yes Historical Provider, MD  cetirizine (ZYRTEC) 10 MG tablet Take 10 mg by mouth daily as needed for allergies.    Yes Historical Provider, MD  clopidogrel (PLAVIX) 75 MG tablet Take 75 mg by mouth daily.   Yes Historical Provider, MD  Cyanocobalamin 1000 MCG/ML KIT Inject 1,000 mcg as directed every 30 (thirty) days. 15th of each month   Yes Historical Provider, MD  ezetimibe (ZETIA) 10 MG tablet Take 10 mg by mouth daily.   Yes Historical Provider, MD  Fish Oil-Cholecalciferol (FISH OIL + D3 PO) Take 1 capsule by mouth daily.   Yes Historical Provider, MD  isosorbide mononitrate (IMDUR) 120 MG 24 hr tablet Take 240 mg by mouth daily.    Yes Historical Provider, MD  nitroGLYCERIN (NITROSTAT) 0.4 MG SL tablet Place 0.4 mg under the tongue every 5 (five) minutes as needed for chest pain.   Yes Historical Provider,  MD  omeprazole (PRILOSEC) 40 MG capsule Take 40 mg by mouth daily.    Yes Historical Provider, MD  pregabalin (LYRICA) 200 MG capsule Take 200 mg by mouth daily.   Yes Historical Provider, MD  promethazine (PHENERGAN) 12.5 MG tablet Take 1-2 tablets (12.5-25 mg total) by mouth every 6 (six) hours as needed for nausea. 11/04/13  Yes Coralie Keens, MD  ranolazine (RANEXA) 1000 MG SR tablet Take 1,000 mg by mouth 2 (two) times daily.   Yes Historical Provider, MD  sertraline (ZOLOFT) 100 MG tablet Take  100 mg by mouth daily.   Yes Historical Provider, MD  simvastatin (ZOCOR) 20 MG tablet Take 20 mg by mouth daily.   Yes Historical Provider, MD  triamterene-hydrochlorothiazide (MAXZIDE-25) 37.5-25 MG per tablet Take 1 tablet by mouth daily.   Yes Historical Provider, MD  atorvastatin (LIPITOR) 10 MG tablet Take 1 tablet (10 mg total) by mouth at bedtime. Patient not taking: Reported on 05/12/2014 09/21/13   Ripudeep Krystal Eaton, MD  docusate sodium 100 MG CAPS Take 100 mg by mouth 2 (two) times daily. Patient not taking: Reported on 05/12/2014 08/28/13   Ainsley Spinner, PA-C  metoprolol (LOPRESSOR) 100 MG tablet Take 1 tablet (100 mg total) by mouth 2 (two) times daily. Patient not taking: Reported on 01/21/2015 09/22/13   Ripudeep Krystal Eaton, MD  ondansetron (ZOFRAN ODT) 4 MG disintegrating tablet Take 1 tablet (4 mg total) by mouth every 8 (eight) hours as needed for nausea or vomiting. Patient not taking: Reported on 05/12/2014 10/22/13   Saverio Danker, PA-C  oxyCODONE-acetaminophen (ROXICET) 5-325 MG per tablet Take 1-2 tablets by mouth every 4 (four) hours as needed for severe pain. Patient not taking: Reported on 01/21/2015 06/25/14   Coralie Keens, MD    Allergies Allergies  Allergen Reactions  . Benadryl [Diphenhydramine] Shortness Of Breath  . Adhesive [Tape]     Burning of skin  . Amoxicillin Nausea And Vomiting    Severe diarrhia  . Flexeril [Cyclobenzaprine] Other (See Comments)    States that it messes with her blood circulation; pt states that she has to walk around    Past Medical History Past Medical History  Diagnosis Date  . Glucose intolerance (impaired glucose tolerance)   . Hypertension   . PAD (peripheral artery disease)   . COPD (chronic obstructive pulmonary disease)   . Peripheral neuropathy   . Chronic back pain   . Chronic leg pain   . CAD (coronary artery disease), native coronary artery     Hx CABG 1998, last PCI 2013; LHC 07/2013:  EF 55-60%, L-LAD prob atretic, no sig  disease in LAD, S-OM1 ok with patent stent, S-dRCA ok => med Rx.  Marland Kitchen HLD (hyperlipidemia)   . Myocardial infarction ([place])   . Shortness of breath     when walking  . Anxiety   . GERD (gastroesophageal reflux disease)   . Wears glasses   . Sleep apnea     uses a cpap     Surgical History   Past Surgical History  Procedure Laterality Date  . Cardiac surgery  1998    LIMA-LAD, SVG-OM, SVG-RCA  . Coronary angioplasty with stent placement  11/2011, 02/2012    DES to SVG-RCA both times, In [place], [place], Dr. Bruce Donath  . Cervical spine surgery  1991 and again several years later  . Knee arthroscopy Right 1998  . Finger surgery Right     ring finger  . Orif humerus fracture Left 08/27/2013  Procedure: OPEN REDUCTION INTERNAL FIXATION (ORIF) LEFT HUMERUS ;  Surgeon: Rozanna Box, MD;  Location: [place];  Service: Orthopedics;  Laterality: Left;  . Cholecystectomy  09/17/2013    dr [name]  . Cholecystectomy N/A 09/16/2013    Procedure: LAPAROSCOPIC CHOLECYSTECTOMY CONVERTED TO OPEN CHOLECYSTECTOMY WITH CHOLANGIOGRAM;  Surgeon: [name], MD;  Location: [place];  Service: General;  Laterality: N/A;  . Ercp N/A 09/19/2013    Procedure: ENDOSCOPIC RETROGRADE CHOLANGIOPANCREATOGRAPHY (ERCP);  Surgeon: [name], MD;  Location: [place];  Service: Endoscopy;  Laterality: N/A;  . I and d right subcostal wound  10/17/13  . Irrigation and debridement abscess Right 10/17/2013    Procedure: INCISION AND DRAINAGE RIGHT SUBCOSTAL WOUND;  Surgeon: [name], MD;  Location: [facility];  Service: General;  Laterality: Right;  . Left heart catheterization with coronary/graft angiogram N/A 07/19/2013    Procedure: LEFT HEART CATHETERIZATION WITH Beatrix Fetters;  Surgeon: Leonie Man, MD;  [facility] C[facility] LAB;  Service: Cardiovascular;  Laterality: N/A;  . Mass excision Right 06/25/2014    Procedure: EXCISION BUTTOCK MASS ;  Surgeon: Coralie Keens, MD;  Location: [place];  Service: General;  Laterality: Right;     Family History  Family History  Problem Relation Age of Onset  . Lung cancer Mother     Social History  Social History   Social History  . Marital Status: Legally Separated    Spouse Name: N/A  . Number of Children: N/A  . Years of Education: N/A   Occupational History  . Disabled    Social History Main Topics  . Smoking status: Former Smoker    Quit date: 07/20/1998  . Smokeless tobacco: Never Used  . Alcohol Use: No  . Drug Use: No  . Sexual Activity: Not on file   Other Topics Concern  . Not on file   Social History Narrative   Lives with best friend.      Review of Systems General:  No chills, fever, night sweats or weight changes.  Cardiovascular:  No  edema, orthopnea, palpitations, paroxysmal nocturnal dyspnea. Positive for chest pain and dyspnea on exertion. Positive for palpitations. Dermatological: No rash, lesions/masses Respiratory: No cough, Positive for dyspnea Urologic: No hematuria, dysuria Abdominal:   No nausea, vomiting, diarrhea, bright red blood per rectum, melena, or hematemesis Neurologic:  No visual changes, wkns, changes in mental status. All other systems reviewed and are otherwise negative except as noted above.   Physical Exam: Blood pressure 98/71, pulse 116, temperature 98.6 F (37 C), temperature source Oral, resp. rate 14, height 5' 2" (1.575 m), weight 185 lb (83.915 kg), SpO2 92 %.  General: Well developed, Caucasian ,female in no acute distress. Head: Normocephalic, atraumatic, sclera non-icteric, no xanthomas, nares are without discharge. Dentition:  Neck: No carotid bruits. JVD not elevated.  Lungs: Respirations regular and unlabored, without wheezes or rales.  Heart: Irregularly irregular. No S3 or S4.  No murmur, no rubs, or gallops appreciated. Abdomen: Soft, non-tender, non-distended with normoactive bowel sounds. No hepatomegaly. No rebound/guarding. No obvious  abdominal masses. Msk:  Strength and tone appear normal for age. No joint deformities or effusions. Extremities: No clubbing or cyanosis. No edema.  Distal pedal pulses are 2+ bilaterally. Neuro: Alert and oriented X 3. Moves all extremities spontaneously. No focal deficits noted. Psych:  Responds to questions appropriately with a flat affect. Skin: No rashes or lesions noted   Labs Lab Results  Component Value  Date   WBC 8.1 01/21/2015   HGB 11.7* 01/21/2015   HCT 37.5 01/21/2015   MCV 83.7 01/21/2015   PLT 238 01/21/2015     Recent Labs Lab 01/21/15 1509  NA 139  K 3.7  CL 100*  CO2 27  BUN 15  CREATININE 1.17*  CALCIUM 9.3  GLUCOSE 91   Troponin (Point of Care Test)  Recent Labs  01/21/15 1511  TROPIPOC 0.01   B NATRIURETIC PEPTIDE  Date/Time Value Ref Range Status  05/12/2014 12:19 AM 72.7 0.0 - 100.0 pg/mL Final     ECG Atrial flutter with rate in 120's.  Echo: 10/2013 Study Conclusions - Left ventricle: The cavity size was normal. Wall thickness was normal. Systolic function was normal. The estimated ejection fraction was in the range of 55% to 60%. Wall motion was normal; there were no regional wall motion abnormalities. Doppler parameters are consistent with abnormal left ventricular relaxation (grade 1 diastolic dysfunction). The E/e&' ratio is between 8-15, suggesting indeterminate LV filling pressure. - Mitral valve: Mildly thickened leaflets . There was mild regurgitation. - Left atrium: The atrium was normal in size.  Impressions: - Compared to the prior echo in 09/2013, there are few changes, however, diastolic function is abnormal.  Radiology/Studies: Dg Chest 2 View: 01/21/2015  CLINICAL DATA:  chest discomfort that radiates into her jaw. She Also is having A--fib. Hx of BP- on meds, Hx of heart surgery 1998; coronary stent placement 2013. EXAM: CHEST  2 VIEW COMPARISON:  05/12/2014 FINDINGS: Prior median sternotomy. Left  proximal femoral fixation. Midline trachea. Normal heart size. No pleural effusion or pneumothorax. Clear lungs. IMPRESSION: No acute cardiopulmonary disease. Electronically Signed   By: Abigail Miyamoto M.D.   On: 01/21/2015 15:37    ASSESSMENT AND PLAN:   1. Unstable angina (HCC) - history of CAD s/p CABG SVG-OM, LIMA-LAD, SVG-RCA in 1998, PCI in 2013. She underwent cardiac catheterization here in 07/2013 which showed an atretic LIMA-LAD with no significant disease in the native LAD, patent SVG-OM with patent stent and patent SVG-distRCA.  Had normal Exercise Tolerance test in 05/2014. - having chest discomfort which radiates into her neck and jaw. The pressure is worse with activity and relieved with rest. Now taking 5 SL NTG's to have relief per episode.  - cycle cardiac enzymes. Will start Heparin - will restart Metoprolol at 49m TID. Continue ASA, statin, Ranexa and Plavix. Will decrease Imdur from 1262mBID to 1206maily while restarting Metoprolol and receiving Morphine to avoid hypotension. - [facility] Dr. KelClaiborne BillingsPO after midnight.  2. Paroxysmal Atrial Fibrillation - This patients CHA2DS2-VASc Score and unadjusted Ischemic Stroke Rate (% per year) is equal to 3.2 % stroke rate/year from a score of 3 (HTN, Vascular, Female). Will be on Heparin for now. Will need to consider NOAC for long-term anticoagulation. - Metoprolol 69m45mD for rate control.  3. Hypertension - BP has been 97/67 - 114/83 while in the ED - continue to monitor  4. HLD (hyperlipidemia) - continue Zetia and Lipitor  5. COPD (chronic obstructive pulmonary disease) (HCC)[place]   Signed, BritErma Heritage-C 01/21/2015, 5:52 PM Pager: [phone] have personally seen and examined this patient with BritBernerd Pho-C. I agree with the assessment and plan as outlined above. She has known CAD, last cath 2015 with stable disease. She has had angina daily over the  last year and has been on Ranexa and Imdur per  Dr. [name]. She notes worsened angina last few weeks, now taking multiple SL NTG daily. Also noticing palpitations daily and found to be in atrial fib with RVR today. Will admit, start IV heparin, cycle troponin. Plan cath in am to exclude progression of CAD. Add back Lopressor tonight for rate control (currently 105-110 bpm). Will need long term anticoagulation at discharge.   , 01/21/2015 6:14 PM

## 2015-01-21 NOTE — ED Notes (Addendum)
Pt. Reports having chest discomfort that radiates into her jaw.  She  Also is having A--fib of 120s.  Pt. Has been recently diagnosed with A-fib but is not currently on treatment. Pt. Is fatiqued and has not ate in 2 days . She is experiencing  Some depression.  Alert and oriented X 4. Skin is p/w/d   Pt. Took 324mg  ASA en route

## 2015-01-22 ENCOUNTER — Encounter (HOSPITAL_COMMUNITY): Payer: Self-pay | Admitting: *Deleted

## 2015-01-22 ENCOUNTER — Encounter (HOSPITAL_COMMUNITY)
Admission: EM | Disposition: A | Discharge status: Home / Self Care | Payer: Self-pay | Source: Home / Self Care | Attending: Cardiovascular Disease

## 2015-01-22 DIAGNOSIS — I739 Peripheral vascular disease, unspecified: Secondary | ICD-10-CM | POA: Insufficient documentation

## 2015-01-22 HISTORY — PX: CARDIAC CATHETERIZATION: SHX172

## 2015-01-22 LAB — CBC
HCT: 39.1 % (ref 36.0–46.0)
Hemoglobin: 11.6 g/dL — ABNORMAL LOW (ref 12.0–15.0)
MCH: 25.6 pg — ABNORMAL LOW (ref 26.0–34.0)
MCHC: 29.7 g/dL — ABNORMAL LOW (ref 30.0–36.0)
MCV: 86.1 fL (ref 78.0–100.0)
Platelets: 210 10*3/uL (ref 150–400)
RBC: 4.54 MIL/uL (ref 3.87–5.11)
RDW: 17.4 % — ABNORMAL HIGH (ref 11.5–15.5)
WBC: 7.8 10*3/uL (ref 4.0–10.5)

## 2015-01-22 LAB — BASIC METABOLIC PANEL
Anion gap: 9 (ref 5–15)
BUN: 18 mg/dL (ref 6–20)
CO2: 31 mmol/L (ref 22–32)
Calcium: 9 mg/dL (ref 8.9–10.3)
Chloride: 100 mmol/L — ABNORMAL LOW (ref 101–111)
Creatinine, Ser: 1.25 mg/dL — ABNORMAL HIGH (ref 0.44–1.00)
GFR calc Af Amer: 52 mL/min — ABNORMAL LOW (ref >60)
GFR calc non Af Amer: 45 mL/min — ABNORMAL LOW (ref >60)
Glucose, Bld: 121 mg/dL — ABNORMAL HIGH (ref 65–99)
Potassium: 3.9 mmol/L (ref 3.5–5.1)
Sodium: 140 mmol/L (ref 135–145)

## 2015-01-22 LAB — TROPONIN I
Troponin I: 0.07 ng/mL — ABNORMAL HIGH (ref <0.031)
Troponin I: <0.03 ng/mL (ref <0.031)

## 2015-01-22 LAB — PROTIME-INR
INR: 1.11 (ref 0.00–1.49)
Prothrombin Time: 14.5 seconds (ref 11.6–15.2)

## 2015-01-22 LAB — HEPARIN LEVEL (UNFRACTIONATED): Heparin Unfractionated: 0.22 IU/mL — ABNORMAL LOW (ref 0.30–0.70)

## 2015-01-22 LAB — POCT ACTIVATED CLOTTING TIME: Activated Clotting Time: 417 seconds

## 2015-01-22 SURGERY — LEFT HEART CATH AND CORONARY ANGIOGRAPHY
Anesthesia: LOCAL

## 2015-01-22 MED ORDER — IOHEXOL 350 MG/ML SOLN
INTRAVENOUS | Status: DC | PRN
  Administered 2015-01-22: 215 mL via INTRA_ARTERIAL

## 2015-01-22 MED ORDER — MIDAZOLAM HCL 2 MG/2ML IJ SOLN
INTRAMUSCULAR | Status: AC
  Filled 2015-01-22: qty 2

## 2015-01-22 MED ORDER — ACETAMINOPHEN 325 MG PO TABS: 650.0000 mg | ORAL_TABLET | ORAL | Status: DC | PRN

## 2015-01-22 MED ORDER — SODIUM CHLORIDE 0.9 % IJ SOLN: 3.0000 mL | INTRAMUSCULAR | Status: DC | PRN

## 2015-01-22 MED ORDER — LIDOCAINE HCL (PF) 1 % IJ SOLN
INTRAMUSCULAR | Status: AC
  Filled 2015-01-22: qty 30

## 2015-01-22 MED ORDER — ATORVASTATIN CALCIUM 80 MG PO TABS
80.0000 mg | ORAL_TABLET | Freq: Every day | ORAL | Status: DC
  Administered 2015-01-22: 19:00:00 80 mg via ORAL
  Filled 2015-01-22: qty 1

## 2015-01-22 MED ORDER — ASPIRIN EC 81 MG PO TBEC
81.0000 mg | DELAYED_RELEASE_TABLET | Freq: Every day | ORAL | Status: DC
  Filled 2015-01-22: qty 1

## 2015-01-22 MED ORDER — VERAPAMIL HCL 2.5 MG/ML IV SOLN
INTRAVENOUS | Status: AC
  Filled 2015-01-22: qty 2

## 2015-01-22 MED ORDER — LIDOCAINE HCL (PF) 1 % IJ SOLN
INTRAMUSCULAR | Status: DC | PRN
  Administered 2015-01-22: 10:00:00

## 2015-01-22 MED ORDER — SODIUM CHLORIDE 0.9 % IV SOLN
0.2500 mg/kg/h | INTRAVENOUS | Status: DC
  Filled 2015-01-22: qty 250

## 2015-01-22 MED ORDER — SODIUM CHLORIDE 0.9 % IJ SOLN: 3.0000 mL | Freq: Two times a day (BID) | INTRAMUSCULAR | Status: DC

## 2015-01-22 MED ORDER — FENTANYL CITRATE (PF) 100 MCG/2ML IJ SOLN
INTRAMUSCULAR | Status: AC
  Filled 2015-01-22: qty 2

## 2015-01-22 MED ORDER — HEPARIN SODIUM (PORCINE) 1000 UNIT/ML IJ SOLN
INTRAMUSCULAR | Status: DC | PRN
  Administered 2015-01-22: 4200 [IU] via INTRAVENOUS

## 2015-01-22 MED ORDER — OFF THE BEAT BOOK
Freq: Once | Status: AC
  Administered 2015-01-22: 15:00:00
  Filled 2015-01-22: qty 1

## 2015-01-22 MED ORDER — VERAPAMIL HCL 2.5 MG/ML IV SOLN
INTRA_ARTERIAL | Status: DC | PRN
  Administered 2015-01-22: 09:00:00 via INTRA_ARTERIAL

## 2015-01-22 MED ORDER — NITROGLYCERIN 1 MG/10 ML FOR IR/CATH LAB
INTRA_ARTERIAL | Status: DC | PRN
  Administered 2015-01-22: 10:00:00

## 2015-01-22 MED ORDER — BIVALIRUDIN BOLUS VIA INFUSION - CUPID
INTRAVENOUS | Status: DC | PRN
  Administered 2015-01-22: 64.125 mg via INTRAVENOUS

## 2015-01-22 MED ORDER — CLOPIDOGREL BISULFATE 300 MG PO TABS
ORAL_TABLET | ORAL | Status: AC
  Filled 2015-01-22: qty 1

## 2015-01-22 MED ORDER — FENTANYL CITRATE (PF) 100 MCG/2ML IJ SOLN
INTRAMUSCULAR | Status: DC | PRN
  Administered 2015-01-22: 50 ug via INTRAVENOUS
  Administered 2015-01-22 (×2): 25 ug via INTRAVENOUS

## 2015-01-22 MED ORDER — CLOPIDOGREL BISULFATE 75 MG PO TABS
75.0000 mg | ORAL_TABLET | Freq: Every day | ORAL | Status: DC
  Administered 2015-01-23: 08:00:00 75 mg via ORAL
  Filled 2015-01-22: qty 1

## 2015-01-22 MED ORDER — OXYCODONE-ACETAMINOPHEN 5-325 MG PO TABS
1.0000 | ORAL_TABLET | ORAL | Status: DC | PRN
  Administered 2015-01-22 – 2015-01-23 (×3): 2 via ORAL
  Filled 2015-01-22 (×3): qty 2

## 2015-01-22 MED ORDER — SODIUM CHLORIDE 0.9 % IV SOLN
INTRAVENOUS | Status: DC | PRN
  Administered 2015-01-22: 85 mL via INTRAVENOUS

## 2015-01-22 MED ORDER — ONDANSETRON HCL 4 MG/2ML IJ SOLN: 4.0000 mg | Freq: Four times a day (QID) | INTRAMUSCULAR | Status: DC | PRN

## 2015-01-22 MED ORDER — SODIUM CHLORIDE 0.9 % IV SOLN: INTRAVENOUS | Status: DC

## 2015-01-22 MED ORDER — CLOPIDOGREL BISULFATE 300 MG PO TABS
ORAL_TABLET | ORAL | Status: DC | PRN
  Administered 2015-01-22: 300 mg via ORAL

## 2015-01-22 MED ORDER — SODIUM CHLORIDE 0.9 % IV SOLN
250.0000 mg | INTRAVENOUS | Status: DC | PRN
  Administered 2015-01-22: 1.75 mg/kg/h via INTRAVENOUS

## 2015-01-22 MED ORDER — NITROGLYCERIN 1 MG/10 ML FOR IR/CATH LAB
INTRA_ARTERIAL | Status: DC | PRN
  Administered 2015-01-22: 200 ug via INTRA_ARTERIAL
  Administered 2015-01-22: 100 ug
  Administered 2015-01-22 (×2): 200 ug via INTRACORONARY

## 2015-01-22 MED ORDER — HEPARIN BOLUS VIA INFUSION
2000.0000 [IU] | Freq: Once | INTRAVENOUS | Status: AC
  Administered 2015-01-22: 2000 [IU] via INTRAVENOUS
  Filled 2015-01-22: qty 2000

## 2015-01-22 MED ORDER — HEPARIN (PORCINE) IN NACL 2-0.9 UNIT/ML-% IJ SOLN
INTRAMUSCULAR | Status: AC
  Filled 2015-01-22: qty 1500

## 2015-01-22 MED ORDER — SODIUM CHLORIDE 0.9 % IV SOLN: 250.0000 mL | INTRAVENOUS | Status: DC | PRN

## 2015-01-22 MED ORDER — NITROGLYCERIN 1 MG/10 ML FOR IR/CATH LAB
INTRA_ARTERIAL | Status: AC
  Filled 2015-01-22: qty 10

## 2015-01-22 MED ORDER — MIDAZOLAM HCL 2 MG/2ML IJ SOLN
INTRAMUSCULAR | Status: DC | PRN
  Administered 2015-01-22: 1 mg via INTRAVENOUS
  Administered 2015-01-22: 2 mg via INTRAVENOUS

## 2015-01-22 MED ORDER — BIVALIRUDIN 250 MG IV SOLR
INTRAVENOUS | Status: AC
  Filled 2015-01-22: qty 250

## 2015-01-22 SURGICAL SUPPLY — 24 items
BALLN ANGIOSCULPT RX 2.5X10 (BALLOONS) ×2
BALLN ~~LOC~~ EUPHORA RX 3.25X20 (BALLOONS) ×2
BALLOON ANGIOSCULPT RX 2.5X10 (BALLOONS) IMPLANT
BALLOON ~~LOC~~ EUPHORA RX 3.25X20 (BALLOONS) IMPLANT
CATH INFINITI 5FR AL1 (CATHETERS) ×1 IMPLANT
CATH INFINITI 5FR MULTPACK ANG (CATHETERS) ×1 IMPLANT
CATH VISTA GUIDE 6FR AL1 (CATHETERS) ×1 IMPLANT
CUTTING BAL FLEXTOME RX 3.0X15 (BALLOONS) ×1 IMPLANT
DEVICE RAD COMP TR BAND LRG (VASCULAR PRODUCTS) ×1 IMPLANT
GLIDESHEATH SLEND A-KIT 6F 22G (SHEATH) IMPLANT
GLIDESHEATH SLEND SS 6F .021 (SHEATH) ×1 IMPLANT
HOVERMATT SINGLE USE (MISCELLANEOUS) ×1 IMPLANT
KIT ENCORE 26 ADVANTAGE (KITS) ×1 IMPLANT
KIT HEART LEFT (KITS) ×2 IMPLANT
PACK CARDIAC CATHETERIZATION (CUSTOM PROCEDURE TRAY) ×2 IMPLANT
SHEATH PINNACLE 5F 10CM (SHEATH) ×1 IMPLANT
STENT XIENCE ALPINE RX 3.0X12 (Permanent Stent) ×1 IMPLANT
SYR MEDRAD MARK V 150ML (SYRINGE) ×2 IMPLANT
TRANSDUCER W/STOPCOCK (MISCELLANEOUS) ×2 IMPLANT
TUBING CIL FLEX 10 FLL-RA (TUBING) ×1 IMPLANT
WIRE COUGAR XT STRL 190CM (WIRE) ×1 IMPLANT
WIRE EMERALD 3MM-J .035X150CM (WIRE) ×1 IMPLANT
WIRE HI TORQ VERSACORE-J 145CM (WIRE) ×1 IMPLANT
WIRE SAFE-T 1.5MM-J .035X260CM (WIRE) ×1 IMPLANT

## 2015-01-22 NOTE — Progress Notes (Signed)
TR BAND REMOVAL  LOCATION:    right radial  DEFLATED PER PROTOCOL:    Yes.    TIME BAND OFF / DRESSING APPLIED:    1630   SITE UPON ARRIVAL:    Level 0  SITE AFTER BAND REMOVAL:    Level 0  CIRCULATION SENSATION AND MOVEMENT:    Within Normal Limits   Yes.    COMMENTS:   Frequently checked remainder of shift with no change in assessment, dressing dry and intact, no bleeding or hematoma

## 2015-01-22 NOTE — Interval H&P Note (Signed)
Cath Lab Visit (complete for each Cath Lab visit)  Clinical Evaluation Leading to the Procedure:   ACS: No.  Non-ACS:    Anginal Classification: CCS III  Anti-ischemic medical therapy: Maximal Therapy (2 or more classes of medications)  Non-Invasive Test Results: No non-invasive testing performed  Prior CABG: Previous CABG      History and Physical Interval Note:  01/22/2015 7:45 AM  Wendy [name]  has presented today for surgery, with the diagnosis of unstable angina  The various methods of treatment have been discussed with the patient and family. After consideration of risks, benefits and other options for treatment, the patient has consented to  Procedure(s): Left Heart Cath and Coronary Angiography (N/A) as a surgical intervention .  The patient's history has been reviewed, patient examined, no change in status, stable for surgery.  I have reviewed the patient's chart and labs.  Questions were answered to the patient's satisfaction.     Wendy [name] A

## 2015-01-22 NOTE — Progress Notes (Signed)
Sleeping, laying on left side.

## 2015-01-22 NOTE — Progress Notes (Addendum)
[place] for Heparin Indication:   ACS/STEMI   Allergies  Allergen Reactions  . Benadryl [Diphenhydramine] Shortness Of Breath  . Adhesive [Tape]     Burning of skin  . Amoxicillin Nausea And Vomiting    Severe diarrhia  . Flexeril [Cyclobenzaprine] Other (See Comments)    States that it messes with her blood circulation; pt states that she has to walk around    Patient Measurements: Height: 5\' 2"  (157.5 cm) Weight: 188 lb 8 oz (85.503 kg) (pt didnt want to stand) IBW/kg (Calculated) : 50.1 Heparin Dosing Weight: 69 kg  Vital Signs: Temp: 97.9 F (36.6 C) (11/17 0414) Temp Source: Oral (11/17 0414) BP: 92/43 mmHg (11/17 0414) Pulse Rate: 79 (11/17 0414)  Labs:  Recent Labs  01/21/15 1509 01/21/15 2155 01/22/15 0300  HGB 11.7*  --   --   HCT 37.5  --   --   PLT 238  --   --   HEPARINUNFRC  --   --  0.22*  CREATININE 1.17*  --   --   TROPONINI  --  <0.03  --     Estimated Creatinine Clearance: 50 mL/min (by C-G formula based on Cr of 1.17).  Assessment: 64 y.o  female with Afib for heparin  Goal of Therapy:  Heparin level 0.3-0.7 units/ml Monitor platelets by anticoagulation protocol: Yes   Plan:  Heparin 2000 units IV bolus, then increase heparin 1150 units/hr F/U after cath  Phillis Knack, PharmD, BCPS

## 2015-01-22 NOTE — Progress Notes (Signed)
Bivalrudin to run at 0.25mg /kg/hr until bag runs out as per protocol.  Order entered with a stop time of 27. If bag runs out before this, it is ok and a new bag does not need to be hung.  Dwaine Pringle D. Rayshad Riviello, PharmD, BCPS Clinical Pharmacist Pager: [phone] 01/22/2015 12:01 PM

## 2015-01-23 ENCOUNTER — Encounter (HOSPITAL_COMMUNITY): Payer: Self-pay | Admitting: Cardiovascular Disease

## 2015-01-23 DIAGNOSIS — I25700 Atherosclerosis of coronary artery bypass graft(s), unspecified, with unstable angina pectoris: Secondary | ICD-10-CM

## 2015-01-23 DIAGNOSIS — I480 Paroxysmal atrial fibrillation: Secondary | ICD-10-CM

## 2015-01-23 LAB — BASIC METABOLIC PANEL
Anion gap: 5 (ref 5–15)
BUN: 12 mg/dL (ref 6–20)
CO2: 33 mmol/L — ABNORMAL HIGH (ref 22–32)
Calcium: 8.3 mg/dL — ABNORMAL LOW (ref 8.9–10.3)
Chloride: 101 mmol/L (ref 101–111)
Creatinine, Ser: 0.91 mg/dL (ref 0.44–1.00)
GFR calc Af Amer: >60 mL/min (ref >60)
GFR calc non Af Amer: >60 mL/min (ref >60)
Glucose, Bld: 90 mg/dL (ref 65–99)
Potassium: 3.3 mmol/L — ABNORMAL LOW (ref 3.5–5.1)
Sodium: 139 mmol/L (ref 135–145)

## 2015-01-23 LAB — CBC
HCT: 33.4 % — ABNORMAL LOW (ref 36.0–46.0)
Hemoglobin: 10 g/dL — ABNORMAL LOW (ref 12.0–15.0)
MCH: 26.2 pg (ref 26.0–34.0)
MCHC: 29.9 g/dL — ABNORMAL LOW (ref 30.0–36.0)
MCV: 87.4 fL (ref 78.0–100.0)
Platelets: 160 10*3/uL (ref 150–400)
RBC: 3.82 MIL/uL — ABNORMAL LOW (ref 3.87–5.11)
RDW: 17.7 % — ABNORMAL HIGH (ref 11.5–15.5)
WBC: 6.5 10*3/uL (ref 4.0–10.5)

## 2015-01-23 LAB — PLATELET INHIBITION P2Y12: Platelet Function  P2Y12: 103 [PRU] — ABNORMAL LOW (ref 194–418)

## 2015-01-23 MED ORDER — APIXABAN 5 MG PO TABS: 5.0000 mg | ORAL_TABLET | Freq: Two times a day (BID) | ORAL | Status: DC

## 2015-01-23 MED ORDER — APIXABAN 5 MG PO TABS
5.0000 mg | ORAL_TABLET | Freq: Two times a day (BID) | ORAL | Status: DC
  Administered 2015-01-23: 5 mg via ORAL
  Filled 2015-01-23: qty 1

## 2015-01-23 MED ORDER — METOPROLOL TARTRATE 25 MG PO TABS: 12.5000 mg | ORAL_TABLET | Freq: Three times a day (TID) | ORAL | Status: DC

## 2015-01-23 MED ORDER — ATORVASTATIN CALCIUM 80 MG PO TABS: 80.0000 mg | ORAL_TABLET | Freq: Every day | ORAL | Status: DC

## 2015-01-23 MED ORDER — ISOSORBIDE MONONITRATE ER 120 MG PO TB24: 120.0000 mg | ORAL_TABLET | Freq: Every day | ORAL | Status: DC

## 2015-01-23 NOTE — Progress Notes (Signed)
   01/23/15 0000  BiPAP/CPAP/SIPAP  BiPAP/CPAP/SIPAP Pt Type Adult  Mask Type Nasal mask  Mask Size Medium  Set Rate 0 breaths/min  Respiratory Rate 18 breaths/min  IPAP 9 cmH20  EPAP 9 cmH2O  Flow Rate 3 lpm  BiPAP/CPAP/SIPAP CPAP  Patient Home Equipment No  Auto Titrate No  BiPAP/CPAP /SiPAP Vitals  Pulse Rate 63  Resp 16  SpO2 100 %  Patient placed on CPAP on above settings.She tolerates it very well at this time.

## 2015-01-23 NOTE — Care Management Note (Signed)
Case Management Note  Patient Details  Name: MAKAYLA DILALLO MRN: [id number] Date of Birth: 21-Mar-1950  Subjective/Objective:  Date: 01/23/15 Spoke with patient at the bedside.  Introduced self as Tourist information centre manager and explained role in discharge planning and how to be reached.  Verified patient lives in [place] with her sister.  Expressed potential need for no other DME.  Verified patient anticipates to go home with sister at time of discharge and will have full-time supervision by family at this time to best of their knowledge. Patient denied needing help with their medication.  Patient drives or is driven by her sister to MD appointments.  Verified patient has PCP Daphene Jaeger, NCM gave patient 30 day trial savings card for Eliquis and verified that her pharmacy has in stock, patient has medicare/medicaid insurance.   Plan: CM will continue to follow for discharge planning and [facility] resources.                   Action/Plan:   Expected Discharge Date:                  Expected Discharge Plan:  Home/Self Care  In-House Referral:     Discharge planning Services  CM Consult  Post Acute Care Choice:    Choice offered to:     DME Arranged:    DME Agency:     HH Arranged:    [place] Agency:     Status of Service:  Completed, signed off  Medicare Important Message Given:    Date Medicare IM Given:    Medicare IM give by:    Date Additional Medicare IM Given:    Additional Medicare Important Message give by:     If discussed at [place] of Stay Meetings, dates discussed:    Additional Comments:  Zenon Mayo, RN 01/23/2015, 11:00 AM

## 2015-01-23 NOTE — Progress Notes (Signed)
CARDIAC REHAB PHASE I   PRE:  Rate/Rhythm: 75 a. fib  BP:  Sitting: 126/41        SaO2: 94 RA  MODE:  Ambulation: 230 ft   POST:  Rate/Rhythm: 78 a.fib, ?SR c/ PACs  BP:  Sitting: 116/65         SaO2: 90 RA  Pt lying in bed, c/o headache and back pain. Pt ambulated 230 ft on RA, handheld assist, slow, steady gait, tolerated well.  Pt c/o of mild DOE (states it is much improved), denies cp, dizziness, brief standing rest x1. Pt did c/o mild dizziness upon return to room while sitting, quickly resolved. VSS. Pt appears to be going in and out of a.fib on monitor. Completed PCI/stent education.  Reviewed risk factors, anti-platelet therapy, stent card, activity restrictions, ntg, exercise, heart healthy diet, portion control, and phase 2 cardiac rehab. Pt verbalized understanding. Pt agrees to phase 2 cardiac rehab referral, will send to [facility] per pt request.  Pt states this has been a very difficult year for her as her sister and nephew died in 04-30-22. Pt states she has felt depressed and has mostly stayed in her home. Emotional support given to pt. Pt receptive to education and conversation. Pt to chair after walk, call bell within reach, awaiting discharge.   [id number]     Lenna Sciara, RN, BSN 01/23/2015 9:04 AM

## 2015-01-23 NOTE — Progress Notes (Signed)
Patient Name: Wendy [name] Date of Encounter: 01/23/2015  Principal Problem:   Unstable angina [facility]) Active Problems:   CAD (coronary artery disease)   Hypertension   HLD (hyperlipidemia)   COPD (chronic obstructive pulmonary disease) (HCC)   Paroxysmal atrial fibrillation (HCC)   PVD (peripheral vascular disease) [facility])    Primary Cardiologist: Dr. [name] [facility]) Patient Profile: 64 y.o. female with past medical history of CAD (s/p CABG SVG-OM, LIMA-LAD, SVG-RCA in1998, PCI in 2013), HTN, HLD, PAD, COPD, and GERD admitted on 01/21/2015 for unstable angina. She was also noted to be in atrial fibrillation with rate in the 140's at time of admission  SUBJECTIVE: Having a headache this morning. Denies any recurrence in her chest pain or shortness of breath. No complications noted following her catheterization.  OBJECTIVE Filed Vitals:   01/22/15 1951 01/22/15 1954 01/23/15 0000 01/23/15 0349  BP:  94/43  108/35  Pulse: 64  63 62  Temp: 98.9 F (37.2 C)   98.3 F (36.8 C)  TempSrc: Oral   Oral  Resp: 16  16   Height:      Weight:    200 lb 6.4 oz (90.901 kg)  SpO2: 96%  100% 100%    Intake/Output Summary (Last 24 hours) at 01/23/15 0735 Last data filed at 01/23/15 0100  Gross per 24 hour  Intake 1767.5 ml  Output    650 ml  Net 1117.5 ml   Filed Weights   01/21/15 1410 01/22/15 0414 01/23/15 0349  Weight: 185 lb (83.915 kg) 188 lb 8 oz (85.503 kg) 200 lb 6.4 oz (90.901 kg)    PHYSICAL EXAM General: Well developed, well nourished, female in no acute distress. Head: Normocephalic, atraumatic.  Neck: Supple without bruits, JVD not elevated. Lungs:  Resp regular and unlabored, CTA without wheezing or rales Heart: RRR, S1, S2, no S3, S4, or murmur; no rub. Abdomen: Soft, non-tender, non-distended with normoactive bowel sounds. No hepatomegaly. No rebound/guarding. No obvious abdominal masses. Extremities: No clubbing, cyanosis, or edema. Distal pedal pulses  are 2+ bilaterally. Right radial cath site without ecchymosis or hematoma. Neuro: Alert and oriented X 3. Moves all extremities spontaneously. Psych: Normal affect.  LABS: CBC: Recent Labs  01/22/15 0713 01/23/15 0625  WBC 7.8 6.5  HGB 11.6* 10.0*  HCT 39.1 33.4*  MCV 86.1 87.4  PLT 210 160   INR: Recent Labs  01/22/15 0713  INR 3.36   Basic Metabolic Panel: Recent Labs  01/21/15 1509 01/22/15 0713  NA 139 140  K 3.7 3.9  CL 100* 100*  CO2 27 31  GLUCOSE 91 121*  BUN 15 18  CREATININE 1.17* 1.25*  CALCIUM 9.3 9.0   Cardiac Enzymes: Recent Labs  01/21/15 2155 01/22/15 0300 01/22/15 0713  TROPONINI <0.03 0.07* <0.03    Recent Labs  01/21/15 1511  TROPIPOC 0.01   BNP:  B NATRIURETIC PEPTIDE  Date/Time Value Ref Range Status  05/12/2014 12:19 AM 72.7 0.0 - 100.0 pg/mL Final   TELE: NSR with rate in mid 50's - 60's. No atopic events.     ECG: NSR with rate in 60's.  Cardiac Catheterization: 01/22/2015  Prox RCA lesion, 100% stenosed.  2nd Mrg lesion, 100% stenosed.  1st Mrg-2 lesion, 100% stenosed. The lesion was previously treated with a stent (unknown type).  1st Mrg-1 lesion, 100% stenosed.  SVG was injected is large.  There is severe disease in the graft.  SVG was injected .  There is severe disease in  the graft.  Origin lesion, 100% stenosed.  Dist Cx lesion, 75% stenosed.  Prox Graft lesion, 95% stenosed. Post intervention, there is a 0% residual stenosis. The lesion was previously treated with a stent (unknown type).  Mid Graft lesion, 75% stenosed. Post intervention, 0% residual stenosis.  The left ventricular systolic function is normal.  Low normal global LV function with an ejection fraction of 50-55%.  Significant native CAD with 75% stenosis in the mid AV groove circumflex with total occlusion of the proximal marginal and distal marginal, but with filling of the distal marginal beyond a previously placed occluded  stented segment, total occlusion of a calcified RCA, and LAD system without significant obstructive disease and only mild luminal irregularity.  Previously documented atretic LIMA to the LAD, not visualized today.  New or recent occlusion of the vein graft which had supplied the left circumflex system.  SVG supplying a large distal RCA system with previously placed stents in the proximal to midsegment with overlap and 95% focal in-stent stenosis in the proximal portion of the stent and 70% in-stent stenosis in the distal portion of the stent.  PCI to the SVG to RCA with initial Angiosculpt scoring balloon, subsequently Flextone cutting balloon, high pressure noncompliant balloon dilatation to the entire stented segment, and focal stenting at the proximal site with insertion of a new 3.012 mm Xience Alpine DES stent postdilated to 3.3 mm. The proximal 95% in-stent restenosis was reduced to less than 5% and the distal 75% in-stent restenosis was reduced to 0%.  Recommendation: The patient should be maintained on dual antiplatelet therapy indefinitely. Her LIMA has been previously documented to be atretic, but her LAD system is without significant obstructive disease. Medical therapy for her concomitant disease  Radiology/Studies: Dg Chest 2 View: 01/21/2015  CLINICAL DATA:  chest discomfort that radiates into her jaw. She Also is having A--fib. Hx of BP- on meds, Hx of heart surgery 1998; coronary stent placement 2013. EXAM: CHEST  2 VIEW COMPARISON:  05/12/2014 FINDINGS: Prior median sternotomy. Left proximal femoral fixation. Midline trachea. Normal heart size. No pleural effusion or pneumothorax. Clear lungs. IMPRESSION: No acute cardiopulmonary disease. Electronically Signed   By: Abigail Miyamoto M.D.   On: 01/21/2015 15:37     Current Medications:  . aspirin EC  81 mg Oral Daily  . atorvastatin  80 mg Oral q1800  . clopidogrel  75 mg Oral Q breakfast  . ezetimibe  10 mg Oral Daily  .  isosorbide mononitrate  120 mg Oral Daily  . loratadine  10 mg Oral Daily  . metoprolol tartrate  12.5 mg Oral TID  . pantoprazole  40 mg Oral Daily  . pregabalin  200 mg Oral Daily  . ranolazine  1,000 mg Oral BID  . sertraline  100 mg Oral Daily  . sodium chloride  3 mL Intravenous Q12H   . sodium chloride 100 mL/hr at 01/22/15 1730    ASSESSMENT AND PLAN:  1. Unstable angina (HCC) - history of CAD s/p CABG SVG-OM, LIMA-LAD, SVG-RCA in 1998, PCI in 2013. She underwent cardiac catheterization here in 07/2013 which showed an atretic LIMA-LAD with no significant disease in the native LAD, patent SVG-OM with patent stent and patent SVG-distRCA. Had normal Exercise Tolerance test in 05/2014. - having chest discomfort which radiates into her neck and jaw. The pressure is worse with activity and relieved with rest. Now taking 5 SL NTG's to have relief per episode.  - cyclic troponin values were 0.03, 0.07, and  0.03. - LHC on 01/22/2015 showed 95% in-stent restenosis of the SVG-RCA. PCI was performed with a 3.012 mm Xience Alpine DES stent postdilated to 3.3 mm. - Continue ASA, statin, BB, Imdur, Ranexa and Plavix.   2. Paroxysmal Atrial Fibrillation - This patients CHA2DS2-VASc Score and unadjusted Ischemic Stroke Rate (% per year) is equal to 3.2 % stroke rate/year from a score of 3 (HTN, Vascular, Female). Needs to be on DAPT for CAD. Consider need for triple therapy in setting of anticoagulation for atrial fibrillation. - Converted to NSR on 01/22/2015. - Metoprolol 12.89m TID for rate control.   3. Hypertension - BP has been 77/31 - 127/69 in the past 24 hours. - continue to monitor  4. HLD (hyperlipidemia) - continue Zetia and Lipitor  5. COPD (chronic obstructive pulmonary disease) ([place] - continue home medications  Needs to ambulate with cardiac rehab this morning prior to discharge. [facility] [place]   Signed, BErma Heritage, PA-C 7:35 AM 01/23/2015 Pager: [phone]  I have personally seen and examined this patient with BBernerd Pho PA-C. I agree with the assessment and plan as outlined above. She is stable s/p PCI of the SVG to RCA yesterday. Will continue cardiac meds including Plavix but will stop ASA. Will start Eliquis for atrial fib. She does not wish to follow up with Dr. MBettina Gavia She has met Dr. NAcie Fredricksonduring previous hospitalizations and requests that she be seen by him in our office. We will discharge today and plan f/u with Dr. NAcie Fredricksonin several weeks.   Samual Beals 01/23/2015 8:14 AM

## 2015-01-23 NOTE — Progress Notes (Signed)
IV removed per discharge order. Discharge instructions and scripts given and explained to patient with teach back.

## 2015-01-23 NOTE — Discharge Instructions (Signed)
PLEASE REMEMBER TO BRING ALL OF YOUR MEDICATIONS TO EACH OF YOUR FOLLOW-UP OFFICE VISITS.  PLEASE ATTEND ALL SCHEDULED FOLLOW-UP APPOINTMENTS.   Activity: Increase activity slowly as tolerated. You may shower, but no soaking baths (or swimming) for 1 week. No driving for 24 hours. No lifting over 5 lbs for 1 week. No sexual activity for 1 week.   You May Return to Work: in 1 week (if applicable)  Wound Care: You may wash cath site gently with soap and water. Keep cath site clean and dry. If you notice pain, swelling, bleeding or pus at your cath site, please call [phone].   TAKE IMDUR ONCE A DAY FOR NOW. CHECK YOUR BLOOD PRESSURE AT HOME DAILY. IF THE TOP READING (SYSTOLIC READING) IS GREATER THAN 150 CONSISTENTLY, START BACK TAKING TRIAMTERENE-HCTZ (MAXZIDE). IF YOU HAVE ANY QUESTIONS, PLEASE CONTACT OUR OFFICE AT [phone].

## 2015-01-23 NOTE — Discharge Summary (Signed)
CARDIOLOGY DISCHARGE SUMMARY   Patient ID: Wendy [name] MRN: [id number] DOB/AGE: [birth year] 64 y.o.  Admit date: 01/21/2015 Discharge date: 01/23/2015  PCP: Daphene Jaeger, PA-C Primary Cardiologist: Dr. [name] ([place])/ Dr. [name] [facility])  Primary Discharge Diagnosis: Unstable angina [facility]) Secondary Discharge Diagnosis: CAD (coronary artery disease), Hypertension, HLD (hyperlipidemia), COPD (chronic obstructive pulmonary disease) ([place]), Paroxysmal atrial fibrillation ([place]), PVD (peripheral vascular disease) ([place])  Consults: None  Procedures: Left Heart Catheterization, Coronary Angiography, Percutaneous Intervention  Hospital Course: Wendy [name] is a 64 y.o. female with past medical history of CAD (s/p CABG SVG-OM, LIMA-LAD, SVG-RCA in1998, PCI in 2013), HTN, HLD, PAD, COPD, and GERD who presented to [facility] ED on 01/21/2015 for worsening chest discomfort over the past several months.   The pain had been radiating to her neck and jaw, was worse with activity, relieved with rest, and was not relieved with her usual amount of SL NTG, for she had been taking 5 per episode of chest pain prior to admission. She was seen by her primary cardiologist in August and had been started on Ranexa 1030m BID and her Imdur has been increased to 12620mBID, yet she was still having breakthrough anginal symptoms.  Upon arrival to the ED, she was also noted to be in atrial fibrillation with rate in the 140's. She was started back on Metoprolol 12.20m64mID for rate control due to her soft BP's. (She had been on Metoprolol previously but quit taking it.) Heparin was started as anticoagulation for her atrial fibrillation and acute coronary syndrome with the plan for long-term anticoagulation at time of discharge.  Due to her prior cardiac history and recent unstable angina, it was recommended she undergo cardiac catheterization for further examination of her symptoms. The risks and benefits of  the procedure were explained to the patient and she agreed to proceed.  On the morning of 01/22/2015, she was noted to have gone back into NSR. Her catheterization showed 95% in-stent restenosis of the proximal SVG-RCA and 70% in-stent stenosis in the distal portion of the stent. A 3.012 mm Xience Alpine DES stent postdilated to 3.3 mm was placed at the site of the lesion with the proximal restenosis reduced to less than 5% and the distal restenosis reduced to 0%. The full report is included below. No complications were noted during the procedure. DAPT was recommended indefinitely.  On 01/23/2015, she reported feeling well except for a slight headache. She denied any recurrence in her chest pain, shortness of breath, or palpitations. Her right radial cath site was without evidence of infection or overt bleeding. She ambulated over 200f89fth cardiac rehab and experienced mild dyspnea, but said it was much improved from her initial presentation. She continued to have episodes of hypotension overnight with systolic readings in the 90's52'Wow 100's.  The patient was last examined by Dr. McAlAngelena Form deemed stable for discharge. In regards to her anticoagulation, it was decided she should be on Eliquis and Plavix going forward in the setting of her repeat episode of PAF.  ASA will be stopped at this time to avoid triple therapy. She was instructed to take Imdur 120mg73mly at this time instead of BID. She was also instructed not to take Maxzide at this time due to her hypotension while in the hospital. She will check her BP daily and call the office if her systolic readings are greater than 150 consistently. This was included on her discharge instructions. Starting a low-dose ACE-I  should be considered at time of follow-up if BP allows.  She wishes to follow-up with [facility] instead of her primary cardiologist and preferred to be seen by Dr. [name]. A hospital follow-up appointment has been scheduled on  02/06/2015.  Labs:   Lab Results  Component Value Date   WBC 6.5 01/23/2015   HGB 10.0* 01/23/2015   HCT 33.4* 01/23/2015   MCV 87.4 01/23/2015   PLT 160 01/23/2015     Recent Labs Lab 01/23/15 0625  NA 139  K 3.3*  CL 101  CO2 33*  BUN 12  CREATININE 0.91  CALCIUM 8.3*  GLUCOSE 90    Recent Labs  01/21/15 2155 01/22/15 0300 01/22/15 0713  TROPONINI <0.03 0.07* <0.03    B NATRIURETIC PEPTIDE  Date/Time Value Ref Range Status  05/12/2014 12:19 AM 72.7 0.0 - 100.0 pg/mL Final    Recent Labs  01/22/15 0713  INR 1.11      Radiology:  Dg Chest 2 View: 01/21/2015  CLINICAL DATA:  chest discomfort that radiates into her jaw. She Also is having A--fib. Hx of BP- on meds, Hx of heart surgery 1998; coronary stent placement 2013. EXAM: CHEST  2 VIEW COMPARISON:  05/12/2014 FINDINGS: Prior median sternotomy. Left proximal femoral fixation. Midline trachea. Normal heart size. No pleural effusion or pneumothorax. Clear lungs. IMPRESSION: No acute cardiopulmonary disease. Electronically Signed   By: Abigail Miyamoto M.D.   On: 01/21/2015 15:37    Cardiac Catheterization: 01/22/2015  Prox RCA lesion, 100% stenosed.  2nd Mrg lesion, 100% stenosed.  1st Mrg-2 lesion, 100% stenosed. The lesion was previously treated with a stent (unknown type).  1st Mrg-1 lesion, 100% stenosed.  SVG was injected is large.  There is severe disease in the graft.  SVG was injected .  There is severe disease in the graft.  Origin lesion, 100% stenosed.  Dist Cx lesion, 75% stenosed.  Prox Graft lesion, 95% stenosed. Post intervention, there is a 0% residual stenosis. The lesion was previously treated with a stent (unknown type).  Mid Graft lesion, 75% stenosed. Post intervention, 0% residual stenosis.  The left ventricular systolic function is normal.  Low normal global LV function with an ejection fraction of 50-55%.  Significant native CAD with 75% stenosis in the mid AV groove  circumflex with total occlusion of the proximal marginal and distal marginal, but with filling of the distal marginal beyond a previously placed occluded stented segment, total occlusion of a calcified RCA, and LAD system without significant obstructive disease and only mild luminal irregularity.  Previously documented atretic LIMA to the LAD, not visualized today.  New or recent occlusion of the vein graft which had supplied the left circumflex system.  SVG supplying a large distal RCA system with previously placed stents in the proximal to midsegment with overlap and 95% focal in-stent stenosis in the proximal portion of the stent and 70% in-stent stenosis in the distal portion of the stent.  PCI to the SVG to RCA with initial Angiosculpt scoring balloon, subsequently Flextone cutting balloon, high pressure noncompliant balloon dilatation to the entire stented segment, and focal stenting at the proximal site with insertion of a new 3.012 mm Xience Alpine DES stent postdilated to 3.3 mm. The proximal 95% in-stent restenosis was reduced to less than 5% and the distal 75% in-stent restenosis was reduced to 0%.  Recommendation: The patient should be maintained on dual antiplatelet therapy indefinitely. Her LIMA has been previously documented to be atretic, but her LAD system  is without significant obstructive disease. Medical therapy for her concomitant disease  EKG: 01/21/2015 - Atrial fibrillation with rate in 120's. EKG: 01/23/2015 - NSR with rate in 60's.  FOLLOW UP PLANS AND APPOINTMENTS Allergies  Allergen Reactions  . Benadryl [Diphenhydramine] Shortness Of Breath  . Adhesive [Tape]     Burning of skin  . Amoxicillin Nausea And Vomiting    Severe diarrhia  . Flexeril [Cyclobenzaprine] Other (See Comments)    States that it messes with her blood circulation; pt states that she has to walk around     Medication List    STOP taking these medications        ondansetron 4 MG  disintegrating tablet  Commonly known as:  ZOFRAN ODT     simvastatin 20 MG tablet  Commonly known as:  ZOCOR     triamterene-hydrochlorothiazide 37.5-25 MG tablet  Commonly known as:  MAXZIDE-25      TAKE these medications        ADVAIR DISKUS 250-50 MCG/DOSE Aepb  Generic drug:  Fluticasone-Salmeterol  Inhale 1 puff into the lungs every 12 (twelve) hours as needed. Wheezing     albuterol 108 (90 BASE) MCG/ACT inhaler  Commonly known as:  PROVENTIL HFA;VENTOLIN HFA  Inhale 2 puffs into the lungs every 6 (six) hours as needed for wheezing or shortness of breath.     ALPRAZolam 1 MG tablet  Commonly known as:  XANAX  Take 1-2 mg by mouth at bedtime as needed for anxiety or sleep.     apixaban 5 MG Tabs tablet  Commonly known as:  ELIQUIS  Take 1 tablet (5 mg total) by mouth 2 (two) times daily.     atorvastatin 80 MG tablet  Commonly known as:  LIPITOR  Take 1 tablet (80 mg total) by mouth daily at 6 PM.     beclomethasone 80 MCG/ACT inhaler  Commonly known as:  QVAR  Inhale 2 puffs into the lungs 2 (two) times daily as needed (shortness of breath).     cetirizine 10 MG tablet  Commonly known as:  ZYRTEC  Take 10 mg by mouth daily as needed for allergies.     clopidogrel 75 MG tablet  Commonly known as:  PLAVIX  Take 75 mg by mouth daily.     Cyanocobalamin 1000 MCG/ML Kit  Inject 1,000 mcg as directed every 30 (thirty) days. 15th of each month     DSS 100 MG Caps  Take 100 mg by mouth 2 (two) times daily.     ezetimibe 10 MG tablet  Commonly known as:  ZETIA  Take 10 mg by mouth daily.     FISH OIL + D3 PO  Take 1 capsule by mouth daily.     isosorbide mononitrate 120 MG 24 hr tablet  Commonly known as:  IMDUR  Take 1 tablet (120 mg total) by mouth daily.     metoprolol tartrate 25 MG tablet  Commonly known as:  LOPRESSOR  Take 0.5 tablets (12.5 mg total) by mouth 3 (three) times daily.     nitroGLYCERIN 0.4 MG SL tablet  Commonly known as:  NITROSTAT   Place 0.4 mg under the tongue every 5 (five) minutes as needed for chest pain.     omeprazole 40 MG capsule  Commonly known as:  PRILOSEC  Take 40 mg by mouth daily.     oxyCODONE-acetaminophen 5-325 MG tablet  Commonly known as:  ROXICET  Take 1-2 tablets by mouth every 4 (four) hours as  needed for severe pain.     pregabalin 200 MG capsule  Commonly known as:  LYRICA  Take 200 mg by mouth daily.     promethazine 12.5 MG tablet  Commonly known as:  PHENERGAN  Take 1-2 tablets (12.5-25 mg total) by mouth every 6 (six) hours as needed for nausea.     RANEXA 1000 MG SR tablet  Generic drug:  ranolazine  Take 1,000 mg by mouth 2 (two) times daily.     sertraline 100 MG tablet  Commonly known as:  ZOLOFT  Take 100 mg by mouth daily.        Discharge Instructions    Amb Referral to Cardiac Rehabilitation    Complete by:  As directed   Diagnosis:  PCI          Follow-up Information    Follow up with Nahser, Wonda Cheng, MD On 02/06/2015.   Specialty:  Cardiology   Why:  Cardiology Hospital Follow-Up on 02/06/2015 at 11:00AM.   Contact information:   [place] [address] [phone]      BRING ALL MEDICATIONS WITH YOU TO FOLLOW UP APPOINTMENTS  Time spent with patient to include physician time: 45 minutes Signed: Erma Heritage, PA 01/23/2015, 12:41 PM Co-Sign MD

## 2015-02-05 ENCOUNTER — Telehealth (HOSPITAL_COMMUNITY): Payer: Self-pay | Admitting: *Deleted

## 2015-02-05 NOTE — Telephone Encounter (Signed)
Unable to reach pt with numbers provided in epic. Message left for emergency contact for pt to contact [phone]. Cherre Huger, BSN

## 2015-02-06 ENCOUNTER — Encounter: Payer: Self-pay | Admitting: Cardiovascular Disease

## 2015-02-06 ENCOUNTER — Ambulatory Visit (INDEPENDENT_AMBULATORY_CARE_PROVIDER_SITE_OTHER): Payer: Medicare Other | Admitting: Cardiovascular Disease

## 2015-02-06 VITALS — BP 114/80 | HR 67 | Ht 62.0 in | Wt 198.0 lb

## 2015-02-06 DIAGNOSIS — I25700 Atherosclerosis of coronary artery bypass graft(s), unspecified, with unstable angina pectoris: Secondary | ICD-10-CM | POA: Diagnosis not present

## 2015-02-06 DIAGNOSIS — I480 Paroxysmal atrial fibrillation: Secondary | ICD-10-CM

## 2015-02-06 DIAGNOSIS — I209 Angina pectoris, unspecified: Secondary | ICD-10-CM

## 2015-02-06 DIAGNOSIS — E785 Hyperlipidemia, unspecified: Secondary | ICD-10-CM

## 2015-02-06 NOTE — Progress Notes (Signed)
Cardiology Office Note   Date:  02/06/2015   ID:  ILEY DEIGNAN, DOB 02-Dec-1950, MRN [id number]  PCP:  Daphene Jaeger, PA-C  Cardiologist:   Thayer Headings, MD   Chief Complaint  Patient presents with  . Coronary Artery Disease   Problem List 1. Unstable angina (HCC) - history of CAD s/p CABG SVG-OM, LIMA-LAD, SVG-RCA in 1998, PCI in 2013. She underwent cardiac catheterization here in 07/2013 which showed an atretic LIMA-LAD with no significant disease in the native LAD, patent SVG-OM with patent stent and patent SVG-distRCA.  S/p PCI of native RCA ( within the stent )   2. Paroxysmal Atrial Fibrillation - This patients CHA2DS2-VASc Score and unadjusted Ischemic Stroke Rate (% per year) is equal to 3.2 % stroke rate/year from a score of 3 (HTN, Vascular, Female).  3. Hypertension  4. HLD (hyperlipidemia)  5. COPD (chronic obstructive pulmonary disease) ([place])  Dec. 2, 2016:  DAYTONA HEDMAN is a 64 y.o. female who presents for follow up of recent hospitalization for chest pain, abnormal blood pressure and PCI of the right coronary artery. Since the discharge she's not been feeling well.   After the stent, she felt very well for a week or so . Then she started Eliquis after she got home. Has felt poorly since that  No bleeding in stool. Thinks the Eliquis is causing her headaches, diarrhea, feels poorly all over.   Symptoms sound like flu like to me .   Wants to sleep all the time  Has not been to the medical doctor .  Has been very vatigued - complete lack of energy for the past week.   No CP , breathing is ok  No focal neurological changes.    Past Medical History  Diagnosis Date  . Glucose intolerance (impaired glucose tolerance)   . Hypertension   . PAD (peripheral artery disease) ([place])   . COPD (chronic obstructive pulmonary disease) ([place])   . Peripheral neuropathy ([place])   . Chronic back pain   . Chronic leg pain   . CAD (coronary artery disease),  native coronary artery     a. Hx CABG 1998, last PCI 2013 b.LHC 07/2013:  EF 55-60%, L-LAD prob atretic, no sig disease in LAD, S-OM1 ok with patent stent, S-dRCA ok => med Rx. c. LHC 01/2015 DES to SVG-RCA.  Marland Kitchen HLD (hyperlipidemia)   . Myocardial infarction ([place])   . Shortness of breath     when walking  . Anxiety   . GERD (gastroesophageal reflux disease)   . Wears glasses   . Sleep apnea     uses a cpap  . PAF (paroxysmal atrial fibrillation) ([place])     a. 09/2013 post-op b. 01/2015    Past Surgical History  Procedure Laterality Date  . Cardiac surgery  1998    LIMA-LAD, SVG-OM, SVG-RCA  . Coronary angioplasty with stent placement  11/2011, 02/2012, 01/2015    DES to SVG-RCA both times, In [place], [place], Dr. Bruce Donath  . Cervical spine surgery  1991 and again several years later  . Knee arthroscopy Right 1998  . Finger surgery Right     ring finger  . Orif humerus fracture Left 08/27/2013    Procedure: OPEN REDUCTION INTERNAL FIXATION (ORIF) LEFT HUMERUS ;  Surgeon: Rozanna Box, MD;  Location: [facility];  Service: Orthopedics;  Laterality: Left;  . Cholecystectomy  09/17/2013    dr [name]  . Cholecystectomy N/A 09/16/2013    Procedure: LAPAROSCOPIC CHOLECYSTECTOMY  CONVERTED TO OPEN CHOLECYSTECTOMY WITH CHOLANGIOGRAM;  Surgeon: [name], MD;  Location: [facility];  Service: General;  Laterality: N/A;  . Ercp N/A 09/19/2013    Procedure: ENDOSCOPIC RETROGRADE CHOLANGIOPANCREATOGRAPHY (ERCP);  Surgeon: [name], MD;  Location: [facility];  Service: Endoscopy;  Laterality: N/A;  . I and d right subcostal wound  10/17/13  . Irrigation and debridement abscess Right 10/17/2013    Procedure: INCISION AND DRAINAGE RIGHT SUBCOSTAL WOUND;  Surgeon: [name], MD;  Location: [facility];  Service: General;  Laterality: Right;  . Left heart catheterization with coronary/graft angiogram N/A 07/19/2013    Procedure: LEFT HEART CATHETERIZATION WITH Beatrix Fetters;  Surgeon:  Leonie Man, MD;  Location: [facility];  Service: Cardiovascular;  Laterality: N/A;  . Mass excision Right 06/25/2014    Procedure: EXCISION BUTTOCK MASS ;  Surgeon: Coralie Keens, MD;  Location: [facility];  Service: General;  Laterality: Right;  . Cardiac catheterization N/A 01/22/2015    Procedure: Left Heart Cath and Coronary Angiography;  Surgeon: Troy Sine, MD;  Location: [facility];  Service: Cardiovascular;  Laterality: N/A;  . Cardiac catheterization N/A 01/22/2015    Procedure: Coronary Stent Intervention;  Surgeon: Troy Sine, MD;  Location: [facility];  Service: Cardiovascular;  Laterality: N/A;  3.0x12 xience prox SVG to RCA     Current Outpatient Prescriptions  Medication Sig Dispense Refill  . ADVAIR DISKUS 250-50 MCG/DOSE AEPB Inhale 1 puff into the lungs every 12 (twelve) hours as needed. Wheezing  3  . albuterol (PROVENTIL HFA;VENTOLIN HFA) 108 (90 BASE) MCG/ACT inhaler Inhale 2 puffs into the lungs every 6 (six) hours as needed for wheezing or shortness of breath.    . ALPRAZolam (XANAX) 1 MG tablet Take 1-2 mg by mouth at bedtime as needed for anxiety or sleep.    Marland Kitchen apixaban (ELIQUIS) 5 MG TABS tablet Take 1 tablet (5 mg total) by mouth 2 (two) times daily. 60 tablet 10  . atorvastatin (LIPITOR) 80 MG tablet Take 1 tablet (80 mg total) by mouth daily at 6 PM. 30 tablet 6  . beclomethasone (QVAR) 80 MCG/ACT inhaler Inhale 2 puffs into the lungs 2 (two) times daily as needed (shortness of breath).     . cetirizine (ZYRTEC) 10 MG tablet Take 10 mg by mouth daily as needed for allergies.     Marland Kitchen clopidogrel (PLAVIX) 75 MG tablet Take 75 mg by mouth daily.    . Cyanocobalamin 1000 MCG/ML KIT Inject 1,000 mcg as directed every 30 (thirty) days. 15th of each month    . docusate sodium 100 MG CAPS Take 100 mg by mouth 2 (two) times daily. (Patient not taking: Reported on 05/12/2014) 20 capsule 1  . ezetimibe (ZETIA) 10 MG tablet Take 10 mg  by mouth daily.    . Fish Oil-Cholecalciferol (FISH OIL + D3 PO) Take 1 capsule by mouth daily.    . isosorbide mononitrate (IMDUR) 120 MG 24 hr tablet Take 1 tablet (120 mg total) by mouth daily.    . metoprolol tartrate (LOPRESSOR) 25 MG tablet Take 0.5 tablets (12.5 mg total) by mouth 3 (three) times daily. 90 tablet 6  . nitroGLYCERIN (NITROSTAT) 0.4 MG SL tablet Place 0.4 mg under the tongue every 5 (five) minutes as needed for chest pain.    Marland Kitchen omeprazole (PRILOSEC) 40 MG capsule Take 40 mg by mouth daily.     Marland Kitchen oxyCODONE-acetaminophen (ROXICET) 5-325 MG per tablet  Take 1-2 tablets by mouth every 4 (four) hours as needed for severe pain. (Patient not taking: Reported on 01/21/2015) 50 tablet 0  . pregabalin (LYRICA) 200 MG capsule Take 200 mg by mouth daily.    . promethazine (PHENERGAN) 12.5 MG tablet Take 1-2 tablets (12.5-25 mg total) by mouth every 6 (six) hours as needed for nausea. 20 tablet 3  . ranolazine (RANEXA) 1000 MG SR tablet Take 1,000 mg by mouth 2 (two) times daily.    . sertraline (ZOLOFT) 100 MG tablet Take 100 mg by mouth daily.     No current facility-administered medications for this visit.    Allergies:   Benadryl; Adhesive; Amoxicillin; Diphenhydramine hcl (sleep); and Flexeril    Social History:  The patient  reports that she quit smoking about 16 years ago. She has never used smokeless tobacco. She reports that she does not drink alcohol or use illicit drugs.   Family History:  The patient's family history includes Lung cancer in her mother.    ROS:  Please see the history of present illness.    Review of Systems: Constitutional:  admits to , appetite change and fatigue.  HEENT: denies photophobia, eye pain, redness, hearing loss, ear pain, congestion, sore throat, rhinorrhea, sneezing, neck pain, neck stiffness and tinnitus.  Respiratory: admits to SOB, DOE,    Cardiovascular: denies chest pain, palpitations and leg swelling.  Gastrointestinal: admits to  nausea, vomiting,  diarrhea,   Genitourinary: denies dysuria, urgency, frequency, hematuria, flank pain and difficulty urinating.  Musculoskeletal: admits to  myalgias,    Skin: denies pallor, rash and wound.  Neurological: admits to dizziness, seizures, syncope, weakness, light-headedness,  headaches.   Hematological: denies adenopathy, easy bruising, personal or family bleeding history.  Psychiatric/ Behavioral: admits to somulence       All other systems are reviewed and negative.    PHYSICAL EXAM: VS:  BP 114/80 mmHg  Pulse 67  Ht _0  (1.575 m)  Wt 198 lb (89.812 kg)  BMI 36.21 kg/m2 , BMI Body mass index is 36.21 kg/(m^2). GEN: Well nourished, well developed, in no acute distress HEENT: normal Neck: no JVD, carotid bruits, or masses Cardiac: RRR; no murmurs, rubs, or gallops,no edema  Respiratory:  clear to auscultation bilaterally, normal work of breathing GI: soft, nontender, nondistended, + BS MS: no deformity or atrophy Skin: warm and dry, no rash Neuro:  Strength and sensation are intact Psych: normal Skin - no rash   EKG:  EKG is not ordered today.    Recent Labs: 05/12/2014: ALT 52*; B Natriuretic Peptide 72.7; Magnesium 1.8 01/23/2015: BUN 12; Creatinine, Ser 0.91; Hemoglobin 10.0*; Platelets 160; Potassium 3.3*; Sodium 139    Lipid Panel    Component Value Date/Time   CHOL 132 07/20/2013 0436   TRIG 291* 07/20/2013 0436   HDL 22* 07/20/2013 0436   CHOLHDL 6.0 07/20/2013 0436   VLDL 58* 07/20/2013 0436   LDLCALC 52 07/20/2013 0436      Wt Readings from Last 3 Encounters:  02/06/15 198 lb (89.812 kg)  01/23/15 200 lb 6.4 oz (90.901 kg)  06/25/14 185 lb (83.915 kg)      Other studies Reviewed: Additional studies/ records that were reviewed today include: . Review of the above records demonstrates:    ASSESSMENT AND PLAN:  1. Coronary artery disease:  She status post stenting of her right coronary artery. She's not had any episodes of  angina.  2. Paroxysmal atrial fibrillation: Patient has been started on Eliquis  in normal sinus rhythm now.  3. Lethargy:  The patient complains of extreme lethargy, diarrhea, headaches. She relates this to starting the Eliquis. I've reviewed her medication list with Fuller Canada, our pharmacist. There is no significant interaction with Eliquis and her other medications. She's on several meds that would make her lethargic but she's been on these for quite some time.  I offered to draw some labs but her daughter would rather take her to the urgent care. I agree with a further evaluation at urgent care since this seems like a noncardiac issue.   we will see her in 6 months.      Current medicines are reviewed at length with the patient today.  The patient does not have concerns regarding medicines.  The following changes have been made:  no change  Labs/ tests ordered today include:  No orders of the defined types were placed in this encounter.     Disposition:   FU with  Me in 6 months     Kavita Bartl, Wonda Cheng, MD  02/06/2015 11:41 AM    [facility] [address], [place], [place]  [address] Phone: [phone]; Fax: [phone]   [facility]  [address] [place], [place]  [address] [phone]   Fax [phone]

## 2015-02-06 NOTE — Patient Instructions (Signed)
Medication Instructions:  Your physician recommends that you continue on your current medications as directed. Please refer to the Current Medication list given to you today.   Labwork: Your physician recommends that you return for lab work in: 3 months  You will need to FAST for this appointment - nothing to eat or drink after midnight the night before except water.    Testing/Procedures: None Ordered   Follow-Up: Your physician wants you to follow-up in: 6 months with Dr. Nahser.  You will receive a reminder letter in the mail two months in advance. If you don't receive a letter, please call our office to schedule the follow-up appointment.   If you need a refill on your cardiac medications before your next appointment, please call your pharmacy.   Thank you for choosing [facility]! Rosabelle Jupin, RN [phone]    

## 2015-02-19 ENCOUNTER — Telehealth (HOSPITAL_COMMUNITY): Payer: Self-pay | Admitting: *Deleted

## 2015-02-19 NOTE — Telephone Encounter (Signed)
Pt presently sleeping.  Message given for pt to contact cardiac rehab due to receiving referral from cardiologist.  During conversation pt became awaken and indicated that she was not ready to participate in rehab.  Pt significant other given contact phone number and reminder cardiac rehab will need to be started within 6 months for [facility] reimbursement.

## 2015-05-11 ENCOUNTER — Other Ambulatory Visit: Payer: Medicare Other

## 2015-06-29 ENCOUNTER — Telehealth: Payer: Self-pay | Admitting: Cardiovascular Disease

## 2015-06-29 NOTE — Telephone Encounter (Signed)
Spoke with Willette Cluster at [facility] who called to verify that patient is supposed to be on Plavix and Eliqius.  She states the patient informed them that one of those had been discontinued.  I advised that there is no documentation from Dr. [name] about discontinuing either medication.  I advised that at last office visit on 12/2 that patient c/o problems she thought were r/t starting Eliquis but Dr. [name] discussed with our pharmacist and they did not see any interactions that should cause her these symptoms.  He advised her to continue both.  Willette Cluster thanked me for the call.

## 2015-06-29 NOTE — Telephone Encounter (Signed)
New Message:  Willette Cluster called in wanting to speak with the nurse about the pt's Plavix and Eliquis medication.

## 2015-07-16 ENCOUNTER — Encounter: Payer: Self-pay | Admitting: Internal Medicine

## 2015-07-23 ENCOUNTER — Encounter: Payer: Self-pay | Admitting: Internal Medicine

## 2015-08-06 ENCOUNTER — Ambulatory Visit: Payer: Medicare Other | Admitting: Cardiovascular Disease

## 2015-08-13 ENCOUNTER — Ambulatory Visit (INDEPENDENT_AMBULATORY_CARE_PROVIDER_SITE_OTHER): Payer: Medicare Other | Admitting: Cardiovascular Disease

## 2015-08-13 ENCOUNTER — Encounter: Payer: Self-pay | Admitting: Cardiovascular Disease

## 2015-08-13 VITALS — BP 140/78 | HR 114 | Ht 62.0 in | Wt 180.0 lb

## 2015-08-13 DIAGNOSIS — I2510 Atherosclerotic heart disease of native coronary artery without angina pectoris: Secondary | ICD-10-CM

## 2015-08-13 DIAGNOSIS — I480 Paroxysmal atrial fibrillation: Secondary | ICD-10-CM | POA: Diagnosis not present

## 2015-08-13 DIAGNOSIS — I25700 Atherosclerosis of coronary artery bypass graft(s), unspecified, with unstable angina pectoris: Secondary | ICD-10-CM

## 2015-08-13 LAB — LIPID PANEL
Cholesterol: 138 mg/dL (ref 125–200)
HDL: 34 mg/dL — ABNORMAL LOW (ref >=46)
LDL Cholesterol: 56 mg/dL (ref <130)
Total CHOL/HDL Ratio: 4.1 Ratio (ref <=5.0)
Triglycerides: 241 mg/dL — ABNORMAL HIGH (ref <150)
VLDL: 48 mg/dL — ABNORMAL HIGH (ref <30)

## 2015-08-13 LAB — CBC WITH DIFFERENTIAL/PLATELET
Basophils Absolute: 58 cells/uL (ref 0–200)
Basophils Relative: 1 %
Eosinophils Absolute: 232 cells/uL (ref 15–500)
Eosinophils Relative: 4 %
HCT: 32.4 % — ABNORMAL LOW (ref 35.0–45.0)
Hemoglobin: 9.6 g/dL — ABNORMAL LOW (ref 11.7–15.5)
Lymphocytes Relative: 20 %
Lymphs Abs: 1160 cells/uL (ref 850–3900)
MCH: 22.3 pg — ABNORMAL LOW (ref 27.0–33.0)
MCHC: 29.6 g/dL — ABNORMAL LOW (ref 32.0–36.0)
MCV: 75.3 fL — ABNORMAL LOW (ref 80.0–100.0)
MPV: 10.4 fL (ref 7.5–12.5)
Monocytes Absolute: 406 cells/uL (ref 200–950)
Monocytes Relative: 7 %
Neutro Abs: 3944 cells/uL (ref 1500–7800)
Neutrophils Relative %: 68 %
Platelets: 212 10*3/uL (ref 140–400)
RBC: 4.3 MIL/uL (ref 3.80–5.10)
RDW: 18.7 % — ABNORMAL HIGH (ref 11.0–15.0)
WBC: 5.8 10*3/uL (ref 3.8–10.8)

## 2015-08-13 LAB — COMPREHENSIVE METABOLIC PANEL
ALT: 14 U/L (ref 6–29)
AST: 23 U/L (ref 10–35)
Albumin: 3.8 g/dL (ref 3.6–5.1)
Alkaline Phosphatase: 61 U/L (ref 33–130)
BUN: 16 mg/dL (ref 7–25)
CO2: 24 mmol/L (ref 20–31)
Calcium: 8.9 mg/dL (ref 8.6–10.4)
Chloride: 105 mmol/L (ref 98–110)
Creat: 0.96 mg/dL (ref 0.50–0.99)
Glucose, Bld: 116 mg/dL — ABNORMAL HIGH (ref 65–99)
Potassium: 3.8 mmol/L (ref 3.5–5.3)
Sodium: 143 mmol/L (ref 135–146)
Total Bilirubin: 0.3 mg/dL (ref 0.2–1.2)
Total Protein: 6.9 g/dL (ref 6.1–8.1)

## 2015-08-13 LAB — TSH: TSH: 2.52 mIU/L

## 2015-08-13 MED ORDER — DILTIAZEM HCL ER COATED BEADS 180 MG PO CP24: 180.0000 mg | ORAL_CAPSULE | Freq: Every day | ORAL | Status: DC

## 2015-08-13 NOTE — Patient Instructions (Signed)
Medication Instructions:  START Cardizem CD 180 mg once daily   Labwork: TODAY - CBC, TSH, complete metabolic panel, cholesterol   Testing/Procedures: None Ordered   Follow-Up: Your physician wants you to follow-up in: 6 months with Dr. [name].  You will receive a reminder letter in the mail two months in advance. If you don't receive a letter, please call our office to schedule the follow-up appointment.   If you need a refill on your cardiac medications before your next appointment, please call your pharmacy.   Thank you for choosing [facility]! Christen Bame, RN [phone]

## 2015-08-13 NOTE — Progress Notes (Signed)
Cardiology Office Note   Date:  08/13/2015   ID:  WHISPER PANASUK, DOB 03-14-1950, MRN [id number]  PCP:  Daphene Jaeger, PA-C  Cardiologist:   [name], MD   Chief Complaint  Patient presents with  . Atrial Fibrillation   Problem List 1. Unstable angina (HCC) - history of CAD s/p CABG SVG-OM, LIMA-LAD, SVG-RCA in 1998, PCI in 2013. She underwent cardiac catheterization here in 07/2013 which showed an atretic LIMA-LAD with no significant disease in the native LAD, patent SVG-OM with patent stent and patent SVG-distRCA.  S/p PCI of native RCA ( within the stent )   2. Paroxysmal Atrial Fibrillation - This patients CHA2DS2-VASc Score and unadjusted Ischemic Stroke Rate (% per year) is equal to 3.2 % stroke rate/year from a score of 3 (HTN, Vascular, Female).  3. Hypertension  4. HLD (hyperlipidemia)  5. COPD (chronic obstructive pulmonary disease) ([place])  Dec. 2, 2016:  LESTIE DAKIN is a 65 y.o. female who presents for follow up of recent hospitalization for chest pain, abnormal blood pressure and PCI of the right coronary artery. Since the discharge she's not been feeling well.   After the stent, she felt very well for a week or so . Then she started Eliquis after she got home. Has felt poorly since that  No bleeding in stool. Thinks the Eliquis is causing her headaches, diarrhea, feels poorly all over.   Symptoms sound like flu like to me .  Wants to sleep all the time  Has not been to the medical doctor .  Has been very vatigued - complete lack of energy for the past week.   No CP , breathing is ok  No focal neurological changes.   August 13, 2015:  [place] is in atrial fib today .   Is having some generalized weakness and dyspnea.  Feels poorly all the time.   Is anemic,  Has been taking all of her meds But did not take them this am  Does not necessarily feel worse when she is in atrial fib   .she feesl poorly all the time ! Is going for colonoscopy soon     Past Medical History  Diagnosis Date  . Glucose intolerance (impaired glucose tolerance)   . Hypertension   . PAD (peripheral artery disease) ([place])   . COPD (chronic obstructive pulmonary disease) ([place])   . Peripheral neuropathy ([place])   . Chronic back pain   . Chronic leg pain   . CAD (coronary artery disease), native coronary artery     a. Hx CABG 1998, last PCI 2013 b.LHC 07/2013:  EF 55-60%, L-LAD prob atretic, no sig disease in LAD, S-OM1 ok with patent stent, S-dRCA ok => med Rx. c. LHC 01/2015 DES to SVG-RCA.  Marland Kitchen HLD (hyperlipidemia)   . Myocardial infarction ([place])   . Shortness of breath     when walking  . Anxiety   . GERD (gastroesophageal reflux disease)   . Wears glasses   . Sleep apnea     uses a cpap  . PAF (paroxysmal atrial fibrillation) ([place])     a. 09/2013 post-op b. 01/2015    Past Surgical History  Procedure Laterality Date  . Cardiac surgery  1998    LIMA-LAD, SVG-OM, SVG-RCA  . Coronary angioplasty with stent placement  11/2011, 02/2012, 01/2015    DES to SVG-RCA both times, In [place], [place], Dr. Bruce Donath  . Cervical spine surgery  1991 and again several years later  . Knee  arthroscopy Right 1998  . Finger surgery Right     ring finger  . Orif humerus fracture Left 08/27/2013    Procedure: OPEN REDUCTION INTERNAL FIXATION (ORIF) LEFT HUMERUS ;  Surgeon: Rozanna Box, MD;  Location: [place];  Service: Orthopedics;  Laterality: Left;  . Cholecystectomy  09/17/2013    dr [name]  . Cholecystectomy N/A 09/16/2013    Procedure: LAPAROSCOPIC CHOLECYSTECTOMY CONVERTED TO OPEN CHOLECYSTECTOMY WITH CHOLANGIOGRAM;  Surgeon: [name], MD;  Location: [place];  Service: General;  Laterality: N/A;  . Ercp N/A 09/19/2013    Procedure: ENDOSCOPIC RETROGRADE CHOLANGIOPANCREATOGRAPHY (ERCP);  Surgeon: [name], MD;  Location: [place];  Service: Endoscopy;  Laterality: N/A;  . I and d right subcostal wound  10/17/13  . Irrigation and debridement  abscess Right 10/17/2013    Procedure: INCISION AND DRAINAGE RIGHT SUBCOSTAL WOUND;  Surgeon: [name], MD;  [facility];  Service: General;  Laterality: Right;  . Left heart catheterization with coronary/graft angiogram N/A 07/19/2013    Procedure: LEFT HEART CATHETERIZATION WITH Beatrix Fetters;  Surgeon: Leonie Man, MD;  Location: [facility];  Service: Cardiovascular;  Laterality: N/A;  . Mass excision Right 06/25/2014    Procedure: EXCISION BUTTOCK MASS ;  Surgeon: Coralie Keens, MD;  Location: [place];  Service: General;  Laterality: Right;  . Cardiac catheterization N/A 01/22/2015    Procedure: Left Heart Cath and Coronary Angiography;  Surgeon: Troy Sine, MD;  Location: [place] [facility];  Service: Cardiovascular;  Laterality: N/A;  . Cardiac catheterization N/A 01/22/2015    Procedure: Coronary Stent Intervention;  Surgeon: Troy Sine, MD;  Location: [place] [facility];  Service: Cardiovascular;  Laterality: N/A;  3.0x12 xience prox SVG to RCA     Current Outpatient Prescriptions  Medication Sig Dispense Refill  . ADVAIR DISKUS 250-50 MCG/DOSE AEPB Inhale 1 puff into the lungs every 12 (twelve) hours as needed. Wheezing  3  . albuterol (PROVENTIL HFA;VENTOLIN HFA) 108 (90 BASE) MCG/ACT inhaler Inhale 2 puffs into the lungs every 6 (six) hours as needed for wheezing or shortness of breath.    . ALPRAZolam (XANAX) 1 MG tablet Take 1-2 mg by mouth at bedtime as needed for anxiety or sleep.    Marland Kitchen apixaban (ELIQUIS) 5 MG TABS tablet Take 1 tablet (5 mg total) by mouth 2 (two) times daily. 60 tablet 10  . atorvastatin (LIPITOR) 80 MG tablet Take 1 tablet (80 mg total) by mouth daily at 6 PM. 30 tablet 6  . beclomethasone (QVAR) 80 MCG/ACT inhaler Inhale 2 puffs into the lungs 2 (two) times daily as needed (shortness of breath).     . cetirizine (ZYRTEC) 10 MG tablet Take 10 mg by mouth daily as needed for allergies.     Marland Kitchen clopidogrel  (PLAVIX) 75 MG tablet Take 75 mg by mouth daily.    Marland Kitchen docusate sodium 100 MG CAPS Take 100 mg by mouth 2 (two) times daily. 20 capsule 1  . ezetimibe (ZETIA) 10 MG tablet Take 10 mg by mouth daily.    . Fish Oil-Cholecalciferol (FISH OIL + D3 PO) Take 1 capsule by mouth daily.    . isosorbide mononitrate (IMDUR) 120 MG 24 hr tablet Take 1 tablet (120 mg total) by mouth daily.    . metoprolol tartrate (LOPRESSOR) 25 MG tablet Take 0.5 tablets (12.5 mg total) by mouth 3 (three) times daily. 90 tablet 6  . nitroGLYCERIN (NITROSTAT) 0.4 MG  SL tablet Place 0.4 mg under the tongue every 5 (five) minutes as needed for chest pain.    Marland Kitchen omeprazole (PRILOSEC) 40 MG capsule Take 40 mg by mouth daily.     . promethazine (PHENERGAN) 12.5 MG tablet Take 1-2 tablets (12.5-25 mg total) by mouth every 6 (six) hours as needed for nausea. 20 tablet 3  . ranolazine (RANEXA) 1000 MG SR tablet Take 1,000 mg by mouth 2 (two) times daily.    . sertraline (ZOLOFT) 100 MG tablet Take 100 mg by mouth daily.     No current facility-administered medications for this visit.    Allergies:   Benadryl; Adhesive; Amoxicillin; Diphenhydramine hcl (sleep); and Flexeril    Social History:  The patient  reports that she quit smoking about 17 years ago. She has never used smokeless tobacco. She reports that she does not drink alcohol or use illicit drugs.   Family History:  The patient's family history includes Lung cancer in her mother.    ROS:  Please see the history of present illness.    Review of Systems: Constitutional:  admits to , appetite change and fatigue.  HEENT: denies photophobia, eye pain, redness, hearing loss, ear pain, congestion, sore throat, rhinorrhea, sneezing, neck pain, neck stiffness and tinnitus.  Respiratory: admits to SOB, DOE,    Cardiovascular: denies chest pain, palpitations and leg swelling.  Gastrointestinal: admits to nausea, vomiting,  diarrhea,   Genitourinary: denies dysuria, urgency,  frequency, hematuria, flank pain and difficulty urinating.  Musculoskeletal: admits to  myalgias,    Skin: denies pallor, rash and wound.  Neurological: admits to dizziness, seizures, syncope, weakness, light-headedness,  headaches.   Hematological: denies adenopathy, easy bruising, personal or family bleeding history.  Psychiatric/ Behavioral: admits to somulence       All other systems are reviewed and negative.    PHYSICAL EXAM: VS:  BP 140/78 mmHg  Pulse 114  Ht 5\' 2"  (1.575 m)  Wt 180 lb (81.647 kg)  BMI 32.91 kg/m2  SpO2 90% , BMI Body mass index is 32.91 kg/(m^2). GEN: Well nourished, well developed, in no acute distress HEENT: normal Neck: no JVD, carotid bruits, or masses Cardiac: RRR; no murmurs, rubs, or gallops,no edema  Respiratory:  clear to auscultation bilaterally, normal work of breathing GI: soft, nontender, nondistended, + BS MS: no deformity or atrophy Skin: warm and dry, no rash Neuro:  Strength and sensation are intact Psych: normal Skin - no rash   EKG:  EKG is ordered today. Atrial fib at rate of 114.   NS ST abnormalities.    Recent Labs: 01/23/2015: BUN 12; Creatinine, Ser 0.91; Hemoglobin 10.0*; Platelets 160; Potassium 3.3*; Sodium 139    Lipid Panel    Component Value Date/Time   CHOL 132 07/20/2013 0436   TRIG 291* 07/20/2013 0436   HDL 22* 07/20/2013 0436   CHOLHDL 6.0 07/20/2013 0436   VLDL 58* 07/20/2013 0436   LDLCALC 52 07/20/2013 0436      Wt Readings from Last 3 Encounters:  08/13/15 180 lb (81.647 kg)  02/06/15 198 lb (89.812 kg)  01/23/15 200 lb 6.4 oz (90.901 kg)      Other studies Reviewed: Additional studies/ records that were reviewed today include: . Review of the above records demonstrates:    ASSESSMENT AND PLAN:  1. Coronary artery disease:  She status post stenting of her right coronary artery. She's not had any episodes of angina.  2. Paroxysmal atrial fibrillation: Patient has been started on  Eliquis.  Is back in a-fib today . Will add Diltiazem 180 CD a day   3. Lethargy:  The patient complains of extreme lethargy, diarrhea, headaches. She relates this to starting the Eliquis. here is no significant interaction with Eliquis and her other medications. She's on several meds that would make her lethargic but she's been on these for quite some time.     Current medicines are reviewed at length with the patient today.  The patient does not have concerns regarding medicines.  The following changes have been made:  no change  Labs/ tests ordered today include:  No orders of the defined types were placed in this encounter.     Disposition:   FU with  Me in 6 months     [name], MD  08/13/2015 9:12 AM    [facility] [place], [place], [place]  [address] Phone: [phone]; Fax: [phone]   [facility]  [address] [place], [place]  [address] [phone]   Fax [phone]

## 2015-08-17 ENCOUNTER — Telehealth: Payer: Self-pay | Admitting: Cardiovascular Disease

## 2015-08-17 NOTE — Telephone Encounter (Signed)
Reviewed lab results with patient who verbalized understanding 

## 2015-08-17 NOTE — Telephone Encounter (Signed)
New message  Pt returned call for blood work results

## 2015-09-16 ENCOUNTER — Telehealth: Payer: Self-pay

## 2015-09-16 ENCOUNTER — Ambulatory Visit (INDEPENDENT_AMBULATORY_CARE_PROVIDER_SITE_OTHER): Payer: Medicare Other | Admitting: Internal Medicine

## 2015-09-16 ENCOUNTER — Encounter: Payer: Self-pay | Admitting: Internal Medicine

## 2015-09-16 VITALS — BP 88/52 | HR 59 | Ht 62.0 in | Wt 178.0 lb

## 2015-09-16 DIAGNOSIS — K219 Gastro-esophageal reflux disease without esophagitis: Secondary | ICD-10-CM | POA: Diagnosis not present

## 2015-09-16 DIAGNOSIS — K589 Irritable bowel syndrome without diarrhea: Secondary | ICD-10-CM | POA: Diagnosis not present

## 2015-09-16 DIAGNOSIS — D509 Iron deficiency anemia, unspecified: Secondary | ICD-10-CM

## 2015-09-16 DIAGNOSIS — I25700 Atherosclerosis of coronary artery bypass graft(s), unspecified, with unstable angina pectoris: Secondary | ICD-10-CM

## 2015-09-16 DIAGNOSIS — R1084 Generalized abdominal pain: Secondary | ICD-10-CM

## 2015-09-16 DIAGNOSIS — Z1211 Encounter for screening for malignant neoplasm of colon: Secondary | ICD-10-CM | POA: Diagnosis not present

## 2015-09-16 DIAGNOSIS — Z7901 Long term (current) use of anticoagulants: Secondary | ICD-10-CM

## 2015-09-16 MED ORDER — NA SULFATE-K SULFATE-MG SULF 17.5-3.13-1.6 GM/177ML PO SOLN: 1.0000 | Freq: Once | ORAL | Status: DC

## 2015-09-16 NOTE — Telephone Encounter (Signed)
  09/16/2015   RE: Wendy [name] DOB: 1950-10-20 MRN: [id number]   Dear Dr. [name],    We have scheduled the above patient for an endoscopic procedure. Our records show that she is on anticoagulation therapy.   Please advise as to how long the patient may come off her therapy of Plavix and Eliquis prior to the procedure, which is scheduled for 10/20/2015.  Please fax back/ or route the completed form to [facility] at [phone].   Sincerely,    Phillis Haggis

## 2015-09-16 NOTE — Patient Instructions (Signed)
You have been scheduled for a colonoscopy. Please follow written instructions given to you at your visit today.  If you use inhalers (even only as needed), please bring them with you on the day of your procedure.  

## 2015-09-16 NOTE — Progress Notes (Signed)
HISTORY OF PRESENT ILLNESS:  Wendy [name] is a 65 y.o. female who is referred by her primary provider Brittney PA-C with complaints of reflux disease and abdominal pain. Also, patient has microcytic anemia. Patient has MULTIPLE SIGNIFICANT medical problems including coronary artery disease with coronary artery bypass grafting and drug-eluting coronary artery stent placement November 2016 on Plavix, atrial fibrillation on Eliquis, COPD secondary to tobacco abuse, chronic pain syndrome, attention, sleep apnea, obesity, and anxiety. First, patient does report a history of indigestion and heartburn. No dysphagia. She takes omeprazole 40 mg daily. This helps that she will have occasional breakthrough symptoms. Next, she reports chronic alternating bowel habits and bloating. Occasional fecal incontinence. She does notice generalized abdominal pain associated with alternating bowel habits. She feels her symptoms have been present since her cholecystectomy 2015. She also underwent ERCP with sphincterotomy (no stones) with Dr. Deatra Ina July 2015. Review of outside records shows chronic microcytic anemia. Blood work from 08/13/2015 he feels hemoglobin 9.6, MCV 75.3. Normal platelets and white blood cell count. Comprehensive metabolic panel was normal including liver tests. Previous CT and ultrasound 2016 and 2015 reviewed. She has not had prior GI evaluation. Multiple additional medications as listed.  REVIEW OF SYSTEMS:  All non-GI ROS negative except for anxiety, arthritis, back pain, fatigue, headaches, hearing problems, heart rhythm change, shortness of breath, excessive thirst, excessive urination with urinary leakage  Past Medical History  Diagnosis Date  . Glucose intolerance (impaired glucose tolerance)   . Hypertension   . PAD (peripheral artery disease) ([place])   . COPD (chronic obstructive pulmonary disease) ([place])   . Peripheral neuropathy ([place])   . Chronic back pain   . Chronic leg pain   . CAD  (coronary artery disease), native coronary artery     a. Hx CABG 1998, last PCI 2013 b.LHC 07/2013:  EF 55-60%, L-LAD prob atretic, no sig disease in LAD, S-OM1 ok with patent stent, S-dRCA ok => med Rx. c. LHC 01/2015 DES to SVG-RCA.  Marland Kitchen HLD (hyperlipidemia)   . Myocardial infarction ([place])   . Shortness of breath     when walking  . Anxiety   . GERD (gastroesophageal reflux disease)   . Wears glasses   . Sleep apnea     uses a cpap  . PAF (paroxysmal atrial fibrillation) ([place])     a. 09/2013 post-op b. 01/2015    Past Surgical History  Procedure Laterality Date  . Cardiac surgery  1998    LIMA-LAD, SVG-OM, SVG-RCA  . Coronary angioplasty with stent placement  11/2011, 02/2012, 01/2015    DES to SVG-RCA both times, In [place], [place], Dr. Bruce Donath  . Cervical spine surgery  1991 and again several years later  . Knee arthroscopy Right 1998  . Finger surgery Right     ring finger  . Orif humerus fracture Left 08/27/2013    Procedure: OPEN REDUCTION INTERNAL FIXATION (ORIF) LEFT HUMERUS ;  Surgeon: Rozanna Box, MD;  Location: [place];  Service: Orthopedics;  Laterality: Left;  . Cholecystectomy  09/17/2013    dr [name]  . Cholecystectomy N/A 09/16/2013    Procedure: LAPAROSCOPIC CHOLECYSTECTOMY CONVERTED TO OPEN CHOLECYSTECTOMY WITH CHOLANGIOGRAM;  Surgeon: [name], MD;  Location: [place];  Service: General;  Laterality: N/A;  . Ercp N/A 09/19/2013    Procedure: ENDOSCOPIC RETROGRADE CHOLANGIOPANCREATOGRAPHY (ERCP);  Surgeon: [name], MD;  Location: [place];  Service: Endoscopy;  Laterality: N/A;  . I and d right subcostal wound  10/17/13  .  Irrigation and debridement abscess Right 10/17/2013    Procedure: INCISION AND DRAINAGE RIGHT SUBCOSTAL WOUND;  Surgeon: [name], MD;  [facility];  Service: General;  Laterality: Right;  . Left heart catheterization with coronary/graft angiogram N/A 07/19/2013    Procedure: LEFT HEART CATHETERIZATION WITH  Beatrix Fetters;  Surgeon: Leonie Man, MD;  Location: [facility];  Service: Cardiovascular;  Laterality: N/A;  . Mass excision Right 06/25/2014    Procedure: EXCISION BUTTOCK MASS ;  Surgeon: Coralie Keens, MD;  Location: [place];  Service: General;  Laterality: Right;  . Cardiac catheterization N/A 01/22/2015    Procedure: Left Heart Cath and Coronary Angiography;  Surgeon: Troy Sine, MD;  Location: [place] [facility];  Service: Cardiovascular;  Laterality: N/A;  . Cardiac catheterization N/A 01/22/2015    Procedure: Coronary Stent Intervention;  Surgeon: Troy Sine, MD;  Location: [place] [facility];  Service: Cardiovascular;  Laterality: N/A;  3.0x12 xience prox SVG to RCA    Social History FREIDA TIMMERMANS  reports that she quit smoking about 17 years ago. She has never used smokeless tobacco. She reports that she does not drink alcohol or use illicit drugs.  family history includes Clotting disorder in her father; Heart disease in her brother and brother; Lung cancer in her mother and sister. There is no history of Colon cancer.  Allergies  Allergen Reactions  . Benadryl [Diphenhydramine] Shortness Of Breath  . Adhesive [Tape]     Burning of skin  . Amoxicillin Nausea And Vomiting    Severe diarrhia  . Diphenhydramine Hcl (Sleep)   . Flexeril [Cyclobenzaprine] Other (See Comments)    States that it messes with her blood circulation; pt states that she has to walk around       PHYSICAL EXAMINATION: Vital signs: BP 88/52 mmHg  Pulse 59  Ht 5\' 2"  (1.575 m)  Wt 178 lb (80.74 kg)  BMI 32.55 kg/m2  SpO2 95%  Constitutional: Unhealthy and chronically ill-appearing, no acute distress Psychiatric: alert and oriented x3, cooperative Eyes: extraocular movements intact, anicteric, conjunctiva pink Mouth: oral pharynx moist, no lesions Neck: supple without thyromegaly Lymph: no lymphadenopathy Cardiovascular: heart regular rate and rhythm,  no murmur Lungs: clear to auscultation bilaterally with distant breath sounds Abdomen: soft, obese, nontender, nondistended, no obvious ascites, no peritoneal signs, normal bowel sounds, no organomegaly Rectal: Deferred until colonoscopy Extremities: no clubbing, cyanosis, or significant lower extremity edema bilaterally Skin: no lesions on visible extremities. tattoo on left upper extremity Neuro: No focal deficits. Cranial nerves intact. No asterixis.  ASSESSMENT:  #1. Iron deficiency anemia in a 65 year old with multiple significant medical problems on chronic Plavix and Eliquis. Rule out significant underlying GI mucosal pathology such as ulcers, AVMs, neoplasia. #2. GERD. Incompletely controlled on PPI #3. Vague abdominal pain #4. IBS. Alternating #5. Obesity #6. Status post cholecystectomy #7. Medical problems on Plavix and Eliquis  PLAN:  #1. Reflux precautions #2. Continue PPI #3. Schedule colonoscopy and upper endoscopy to evaluate iron deficiency anemia and GI complaints. Would consider colonic biopsies to rule out microscopic colitis and duodenal biopsies to rule out sprue if the examinations are otherwise unrevealing. Patient is very high risk given her comorbidities and the need to address her anticoagulants and antiplatelets medications. Ideally we would like to hold her antiplatelet medication for 5-7 days and anticoagulant for 2-3 days. Confer with her cardiologist in this regard.The nature of the procedure, as well as the risks, benefits, and alternatives  were carefully and thoroughly reviewed with the patient. Ample time for discussion and questions allowed. The patient understood, was satisfied, and agreed to proceed. #4. She will need iron replacement therapy post endoscopy  A copy of this consultation note has been sent to [facility], PA-C and Dr. Grayland Jack

## 2015-09-16 NOTE — Telephone Encounter (Signed)
Pt may hold her Plavix for 5 days prior to procedure and may hold Eliquis for 2 days prior to procedure

## 2015-09-17 NOTE — Telephone Encounter (Signed)
No answer.  Calling to relay information about blood thinner before procedure

## 2015-09-21 NOTE — Telephone Encounter (Signed)
Told patient that, per Dr. [name], she could hold her Plavix for 5 days and her Eliquis for 2 days prior to procedure.  Patient understood.

## 2015-10-05 ENCOUNTER — Other Ambulatory Visit: Payer: Self-pay | Admitting: Student

## 2015-10-20 ENCOUNTER — Ambulatory Visit (AMBULATORY_SURGERY_CENTER): Payer: Medicare Other | Admitting: Internal Medicine

## 2015-10-20 ENCOUNTER — Encounter: Payer: Self-pay | Admitting: Internal Medicine

## 2015-10-20 VITALS — BP 120/48 | HR 57 | Temp 97.5°F | Resp 17 | Ht 62.0 in | Wt 178.0 lb

## 2015-10-20 DIAGNOSIS — R1084 Generalized abdominal pain: Secondary | ICD-10-CM | POA: Diagnosis not present

## 2015-10-20 DIAGNOSIS — D122 Benign neoplasm of ascending colon: Secondary | ICD-10-CM | POA: Diagnosis not present

## 2015-10-20 DIAGNOSIS — K591 Functional diarrhea: Secondary | ICD-10-CM

## 2015-10-20 DIAGNOSIS — D509 Iron deficiency anemia, unspecified: Secondary | ICD-10-CM | POA: Diagnosis not present

## 2015-10-20 DIAGNOSIS — K219 Gastro-esophageal reflux disease without esophagitis: Secondary | ICD-10-CM

## 2015-10-20 DIAGNOSIS — K6389 Other specified diseases of intestine: Secondary | ICD-10-CM

## 2015-10-20 MED ORDER — SODIUM CHLORIDE 0.9 % IV SOLN: 500.0000 mL | INTRAVENOUS | Status: DC

## 2015-10-20 MED ORDER — FERROUS SULFATE 325 (65 FE) MG PO TABS: 325.0000 mg | ORAL_TABLET | Freq: Two times a day (BID) | ORAL | Status: AC

## 2015-10-20 NOTE — Op Note (Signed)
[facility] Patient Name: Wendy [name] Procedure Date: 10/20/2015 2:52 PM MRN: [id number] Endoscopist: Docia Chuck. Henrene Pastor , MD Age: 65 Referring MD:  Date of Birth: 05-17-1950 Gender: Female Account #: [id number] Procedure:                Colonoscopy, with cold snare polypectomy X2 and                            random colon biopsies Indications:              Clinically significant diarrhea of unexplained                            origin, Iron deficiency anemia Medicines:                Monitored Anesthesia Care Procedure:                Pre-Anesthesia Assessment:                           - Prior to the procedure, a History and Physical                            was performed, and patient medications and                            allergies were reviewed. The patient's tolerance of                            previous anesthesia was also reviewed. The risks                            and benefits of the procedure and the sedation                            options and risks were discussed with the patient.                            All questions were answered, and informed consent                            was obtained. Prior Anticoagulants: The patient has                            taken Plavix (clopidogrel), last dose was 6 days                            prior to procedure. ASA Grade Assessment: III - A                            patient with severe systemic disease. After                            reviewing the risks and benefits, the patient was  deemed in satisfactory condition to undergo the                            procedure.                           After obtaining informed consent, the colonoscope                            was passed under direct vision. Throughout the                            procedure, the patient's blood pressure, pulse, and                            oxygen saturations were monitored continuously. The                      Model CF-HQ190L [id number]) scope was introduced                            through the anus and advanced to the the cecum,                            identified by appendiceal orifice and ileocecal                            valve. The terminal ileum, ileocecal valve,                            appendiceal orifice, and rectum were photographed.                            The quality of the bowel preparation was excellent.                            The colonoscopy was performed without difficulty.                            The patient tolerated the procedure well. The bowel                            preparation used was SUPREP. Scope In: 2:55:37 PM Scope Out: 3:09:14 PM Scope Withdrawal Time: 0 hours 11 minutes 23 seconds  Total Procedure Duration: 0 hours 13 minutes 37 seconds  Findings:                 The terminal ileum appeared normal.                           Two polyps were found in the ascending colon. The                            polyps were 2 mm in size. These polyps were removed  with a cold snare. Resection and retrieval were                            complete.                           There was a small nonbleeding AVM adjacent to the                            appendiceal orifice.The exam was otherwise without                            abnormality on direct and retroflexion views.                           Biopsies for histology were taken with a cold                            forceps from the entire colon for evaluation of                            microscopic colitis. Complications:            No immediate complications. Estimated blood loss:                            None. Estimated Blood Loss:     Estimated blood loss: none. Impression:               - The examined portion of the ileum was normal.                           - Two 2 mm polyps in the ascending colon, removed                            with a cold  snare. Resected and retrieved.                           - Small cecal AVM.The examination was otherwise                            normal on direct and retroflexion views.                           - Biopsies were taken with a cold forceps from the                            entire colon for evaluation of microscopic colitis. Recommendation:           - Repeat colonoscopy in 5-10 years for surveillance.                           - Resume Plavix (clopidogrel) and Eliquis today at  prior dose.                           - EGD today. Please see report and final                            recommendations.                           - Continue present medications.                           - Await pathology results. Docia Chuck. Henrene Pastor, MD 10/20/2015 3:22:42 PM This report has been signed electronically.

## 2015-10-20 NOTE — Patient Instructions (Signed)
YOU HAD AN ENDOSCOPIC PROCEDURE TODAY AT [facility]:   Refer to the procedure report that was given to you for any specific questions about what was found during the examination.  If the procedure report does not answer your questions, please call your gastroenterologist to clarify.  If you requested that your care partner not be given the details of your procedure findings, then the procedure report has been included in a sealed envelope for you to review at your convenience later.  YOU SHOULD EXPECT: Some feelings of bloating in the abdomen. Passage of more gas than usual.  Walking can help get rid of the air that was put into your GI tract during the procedure and reduce the bloating. If you had a lower endoscopy (such as a colonoscopy or flexible sigmoidoscopy) you may notice spotting of blood in your stool or on the toilet paper. If you underwent a bowel prep for your procedure, you may not have a normal bowel movement for a few days.  Please Note:  You might notice some irritation and congestion in your nose or some drainage.  This is from the oxygen used during your procedure.  There is no need for concern and it should clear up in a day or so.  SYMPTOMS TO REPORT IMMEDIATELY:   Following lower endoscopy (colonoscopy or flexible sigmoidoscopy):  Excessive amounts of blood in the stool  Significant tenderness or worsening of abdominal pains  Swelling of the abdomen that is new, acute  Fever of 100F or higher   Following upper endoscopy (EGD)  Vomiting of blood or coffee ground material  New chest pain or pain under the shoulder blades  Painful or persistently difficult swallowing  New shortness of breath  Fever of 100F or higher  Black, tarry-looking stools  For urgent or emergent issues, a gastroenterologist can be reached at any hour by calling [phone].   DIET:  We do recommend a small meal at first, but then you may proceed to your regular diet.  Drink  plenty of fluids but you should avoid alcoholic beverages for 24 hours.  ACTIVITY:  You should plan to take it easy for the rest of today and you should NOT DRIVE or use heavy machinery until tomorrow (because of the sedation medicines used during the test).    FOLLOW UP: Our staff will call the number listed on your records the next business day following your procedure to check on you and address any questions or concerns that you may have regarding the information given to you following your procedure. If we do not reach you, we will leave a message.  However, if you are feeling well and you are not experiencing any problems, there is no need to return our call.  We will assume that you have returned to your regular daily activities without incident.  If any biopsies were taken you will be contacted by phone or by letter within the next 1-3 weeks.  Please call us at [phone] if you have not heard about the biopsies in 3 weeks.    SIGNATURES/CONFIDENTIALITY: You and/or your care partner have signed paperwork which will be entered into your electronic medical record.  These signatures attest to the fact that that the information above on your After Visit Summary has been reviewed and is understood.  Full responsibility of the confidentiality of this discharge information lies with you and/or your care-partner.  Resume Plavix and Eliquis today at previous dose.  Continue acid  reflux medication.  Schedule follow up with Dr. Henrene Pastor in 4-6 weeks.   Polyp information given.

## 2015-10-20 NOTE — Op Note (Signed)
[facility] Patient Name: Wendy [name] Procedure Date: 10/20/2015 2:45 PM MRN: [id number] Endoscopist: Docia Chuck. Henrene Pastor , MD Age: 65 Referring MD:  Date of Birth: [birth year] Gender: Female Account #: [id number] Procedure:                Upper GI endoscopy, with biopsies Indications:              Generalized abdominal pain, Iron deficiency anemia,                            Suspected esophageal reflux Medicines:                Monitored Anesthesia Care Procedure:                Pre-Anesthesia Assessment:                           - Prior to the procedure, a History and Physical                            was performed, and patient medications and                            allergies were reviewed. The patient's tolerance of                            previous anesthesia was also reviewed. The risks                            and benefits of the procedure and the sedation                            options and risks were discussed with the patient.                            All questions were answered, and informed consent                            was obtained. Prior Anticoagulants: The patient has                            taken Plavix (clopidogrel), last dose was 6 days                            prior to procedure. ASA Grade Assessment: III - A                            patient with severe systemic disease. After                            reviewing the risks and benefits, the patient was                            deemed in satisfactory condition to undergo the  procedure.                           After obtaining informed consent, the endoscope was                            passed under direct vision. Throughout the                            procedure, the patient's blood pressure, pulse, and                            oxygen saturations were monitored continuously. The                            Model GIF-HQ190 [id number]) scope was  introduced                            through the mouth, and advanced to the third part                            of duodenum. The upper GI endoscopy was                            accomplished without difficulty. The patient                            tolerated the procedure well. Scope In: Scope Out: Findings:                 The esophagus was normal.                           The stomach was normal.                           The examined duodenum was normal, save an                            incidental large periampullary diverticulum.                            Biopsies for histology were taken with a cold                            forceps for evaluation of celiac disease.                           The cardia and gastric fundus were normal on                            retroflexion. Complications:            No immediate complications. Estimated Blood Loss:     Estimated blood loss: none. Impression:               - Normal esophagus.                           -  Normal stomach.                           - Normal examined duodenum, with incidental                            diverticulum. Biopsied. Recommendation:           - Resume Plavix and Eliquis today at previous dose.                           - Begin iron sulfate 325 mg twice daily; #60; 11                            refills. Prescribed.                           - Continue acid reflux medication                           - Schedule a follow-up office appointment with Dr.                            Henrene Pastor in 4-6 weeks.                           - Await pathology results. Docia Chuck. Henrene Pastor, MD 10/20/2015 3:27:51 PM This report has been signed electronically.

## 2015-10-20 NOTE — Progress Notes (Signed)
Called to room to assist during endoscopic procedure.  Patient ID and intended procedure confirmed with present staff. Received instructions for my participation in the procedure from the performing physician.  

## 2015-10-20 NOTE — Progress Notes (Signed)
Patient awakening,vss,report to rn 

## 2015-10-21 ENCOUNTER — Telehealth: Payer: Self-pay | Admitting: Internal Medicine

## 2015-10-21 ENCOUNTER — Telehealth: Payer: Self-pay

## 2015-10-21 NOTE — Telephone Encounter (Signed)
  Follow up Call-  Call back number 10/20/2015  Post procedure Call Back phone  # [phone]  Permission to leave phone message Yes  Some recent data might be hidden    Patient was called for follow up after her procedure on 10/20/2015. I spoke with Margaret's aunt and she reports that Wendy [name] has returned to her normal daily activities without any difficulty.

## 2015-10-22 ENCOUNTER — Encounter: Payer: Self-pay | Admitting: Internal Medicine

## 2015-10-22 MED ORDER — FERROUS SULFATE 325 (65 FE) MG PO TABS: 325.0000 mg | ORAL_TABLET | Freq: Two times a day (BID) | ORAL | 11 refills | Status: DC

## 2015-10-22 NOTE — Telephone Encounter (Signed)
Sent rx for Iron

## 2015-12-09 ENCOUNTER — Ambulatory Visit: Payer: Medicare Other | Admitting: Internal Medicine

## 2016-01-07 ENCOUNTER — Other Ambulatory Visit: Payer: Self-pay | Admitting: Student

## 2016-01-07 ENCOUNTER — Other Ambulatory Visit: Payer: Self-pay

## 2016-01-07 MED ORDER — APIXABAN 5 MG PO TABS: 5.0000 mg | ORAL_TABLET | Freq: Two times a day (BID) | ORAL | 11 refills | Status: DC

## 2016-02-11 ENCOUNTER — Ambulatory Visit (INDEPENDENT_AMBULATORY_CARE_PROVIDER_SITE_OTHER): Payer: Medicare Other | Admitting: Cardiovascular Disease

## 2016-02-11 ENCOUNTER — Encounter: Payer: Self-pay | Admitting: Cardiovascular Disease

## 2016-02-11 VITALS — BP 156/68 | HR 74 | Ht 62.0 in | Wt 174.8 lb

## 2016-02-11 DIAGNOSIS — I480 Paroxysmal atrial fibrillation: Secondary | ICD-10-CM | POA: Diagnosis not present

## 2016-02-11 DIAGNOSIS — I2510 Atherosclerotic heart disease of native coronary artery without angina pectoris: Secondary | ICD-10-CM

## 2016-02-11 DIAGNOSIS — I25700 Atherosclerosis of coronary artery bypass graft(s), unspecified, with unstable angina pectoris: Secondary | ICD-10-CM | POA: Diagnosis not present

## 2016-02-11 NOTE — Patient Instructions (Signed)

## 2016-02-11 NOTE — Progress Notes (Signed)
Cardiology Office Note   Date:  02/11/2016   ID:  Wendy [name], DOB December 24, 1950, MRN [id number]  PCP:  Henrine Screws, MD  Cardiologist:   [name], MD   Chief Complaint  Patient presents with  . Follow-up   Problem List 1. Unstable angina (HCC) - history of CAD s/p CABG SVG-OM, LIMA-LAD, SVG-RCA in 1998, PCI in 2013. She underwent cardiac catheterization here in 07/2013 which showed an atretic LIMA-LAD with no significant disease in the native LAD, patent SVG-OM with patent stent and patent SVG-distRCA.  S/p PCI of native RCA ( within the stent )   2. Paroxysmal Atrial Fibrillation - This patients CHA2DS2-VASc Score and unadjusted Ischemic Stroke Rate (% per year) is equal to 3.2 % stroke rate/year from a score of 3 (HTN, Vascular, Female).  3. Hypertension  4. HLD (hyperlipidemia)  5. COPD (chronic obstructive pulmonary disease) ([place])  Dec. 2, 2016:  Wendy [name] is a 65 y.o. female who presents for follow up of recent hospitalization for chest pain, abnormal blood pressure and PCI of the right coronary artery. Since the discharge she's not been feeling well.   After the stent, she felt very well for a week or so . Then she started Eliquis after she got home. Has felt poorly since that  No bleeding in stool. Thinks the Eliquis is causing her headaches, diarrhea, feels poorly all over.   Symptoms sound like flu like to me .  Wants to sleep all the time  Has not been to the medical doctor .  Has been very vatigued - complete lack of energy for the past week.   No CP , breathing is ok  No focal neurological changes.   August 13, 2015:  Wendy [name] is in atrial fib today .   Is having some generalized weakness and dyspnea.  Feels poorly all the time.   Is anemic,  Has been taking all of her meds But did not take them this am  Does not necessarily feel worse when she is in atrial fib   .she feesl poorly all the time ! Is going for colonoscopy soon    02/11/2016:  Is in atrial fib today  Just started today .   Has not seen the A-Fib clinic  needs to have hip replacement  surgery and back injections or surgery   Past Medical History:  Diagnosis Date  . Anxiety   . CAD (coronary artery disease), native coronary artery    a. Hx CABG 1998, last PCI 2013 b.LHC 07/2013:  EF 55-60%, L-LAD prob atretic, no sig disease in LAD, S-OM1 ok with patent stent, S-dRCA ok => med Rx. c. LHC 01/2015 DES to SVG-RCA.  Marland Kitchen Chronic back pain   . Chronic leg pain   . COPD (chronic obstructive pulmonary disease) ([place])   . GERD (gastroesophageal reflux disease)   . Glucose intolerance (impaired glucose tolerance)   . HLD (hyperlipidemia)   . Hypertension   . Myocardial infarction   . PAD (peripheral artery disease) ([place])   . PAF (paroxysmal atrial fibrillation) ([place])    a. 09/2013 post-op b. 01/2015  . Peripheral neuropathy ([place])   . Shortness of breath    when walking  . Sleep apnea    uses a cpap  . Wears glasses     Past Surgical History:  Procedure Laterality Date  . CARDIAC CATHETERIZATION N/A 01/22/2015   Procedure: Left Heart Cath and Coronary Angiography;  Surgeon: Troy Sine, MD;  Location:  [facility];  Service: Cardiovascular;  Laterality: N/A;  . CARDIAC CATHETERIZATION N/A 01/22/2015   Procedure: Coronary Stent Intervention;  Surgeon: Troy Sine, MD;  Location: [facility];  Service: Cardiovascular;  Laterality: N/A;  3.0x12 xience prox SVG to RCA  . CARDIAC SURGERY  1998   LIMA-LAD, SVG-OM, SVG-RCA  . [place] and again several years later  . CHOLECYSTECTOMY  09/17/2013   dr [name]  . CHOLECYSTECTOMY N/A 09/16/2013   Procedure: LAPAROSCOPIC CHOLECYSTECTOMY CONVERTED TO OPEN CHOLECYSTECTOMY WITH CHOLANGIOGRAM;  Surgeon: [name], MD;  Location: [facility];  Service: General;  Laterality: N/A;  . CORONARY ANGIOPLASTY WITH STENT PLACEMENT  11/2011, 02/2012, 01/2015   DES to SVG-RCA both  times, In [place], [place], Dr. Bruce Donath  . ERCP N/A 09/19/2013   Procedure: ENDOSCOPIC RETROGRADE CHOLANGIOPANCREATOGRAPHY (ERCP);  Surgeon: [name], MD;  Location: [facility];  Service: Endoscopy;  Laterality: N/A;  . FINGER SURGERY Right    ring finger  . i and d right subcostal wound  10/17/13  . IRRIGATION AND DEBRIDEMENT ABSCESS Right 10/17/2013   Procedure: INCISION AND DRAINAGE RIGHT SUBCOSTAL WOUND;  Surgeon: [name], MD;  Location: [facility];  Service: General;  Laterality: Right;  . KNEE ARTHROSCOPY Right 1998  . LEFT HEART CATHETERIZATION WITH CORONARY/GRAFT ANGIOGRAM N/A 07/19/2013   Procedure: LEFT HEART CATHETERIZATION WITH Beatrix Fetters;  Surgeon: Leonie Man, MD;  Location[facility];  Service: Cardiovascular;  Laterality: N/A;  . MASS EXCISION Right 06/25/2014   Procedure: EXCISION BUTTOCK MASS ;  Surgeon: Coralie Keens, MD;  Location: [facility];  Service: General;  Laterality: Right;  . ORIF HUMERUS FRACTURE Left 08/27/2013   Procedure: OPEN REDUCTION INTERNAL FIXATION (ORIF) LEFT HUMERUS ;  Surgeon: Rozanna Box, MD;  Location: [facility];  Service: Orthopedics;  Laterality: Left;     Current Outpatient Prescriptions  Medication Sig Dispense Refill  . ADVAIR DISKUS 250-50 MCG/DOSE AEPB Inhale 1 puff into the lungs every 12 (twelve) hours as needed. Wheezing  3  . albuterol (PROVENTIL HFA;VENTOLIN HFA) 108 (90 BASE) MCG/ACT inhaler Inhale 2 puffs into the lungs every 6 (six) hours as needed for wheezing or shortness of breath.    . ALPRAZolam (XANAX) 1 MG tablet Take 1-2 mg by mouth at bedtime as needed for anxiety or sleep.    Marland Kitchen apixaban (ELIQUIS) 5 MG TABS tablet Take 1 tablet (5 mg total) by mouth 2 (two) times daily. 60 tablet 11  . atorvastatin (LIPITOR) 80 MG tablet TAKE 1 TABLET (80 MG TOTAL) BY MOUTH DAILY AT 6 PM. 90 tablet 3  . beclomethasone (QVAR) 80 MCG/ACT inhaler Inhale 2 puffs into the lungs 2 (two) times daily as  needed (shortness of breath).     . cetirizine (ZYRTEC) 10 MG tablet Take 10 mg by mouth daily as needed for allergies.     Marland Kitchen clopidogrel (PLAVIX) 75 MG tablet Take 75 mg by mouth daily.    Marland Kitchen diltiazem (CARDIZEM CD) 180 MG 24 hr capsule Take 1 capsule (180 mg total) by mouth daily. 30 capsule 11  . docusate sodium 100 MG CAPS Take 100 mg by mouth 2 (two) times daily. 20 capsule 1  . ezetimibe (ZETIA) 10 MG tablet Take 10 mg by mouth daily.    . ferrous sulfate 325 (65 FE) MG tablet Take 1 tablet (325 mg total) by mouth 2 (two) times daily. 60 tablet 11  . Fish Oil-Cholecalciferol (FISH  OIL + D3 PO) Take 1 capsule by mouth daily.    Marland Kitchen FLUCELVAX QUADRIVALENT 0.5 ML SUSY Inject as directed once.  0  . isosorbide mononitrate (IMDUR) 120 MG 24 hr tablet Take 1 tablet (120 mg total) by mouth daily.    . metoprolol tartrate (LOPRESSOR) 25 MG tablet Take 0.5 tablets (12.5 mg total) by mouth 3 (three) times daily. 90 tablet 6  . nitroGLYCERIN (NITROSTAT) 0.4 MG SL tablet Place 0.4 mg under the tongue every 5 (five) minutes as needed for chest pain.    Marland Kitchen omeprazole (PRILOSEC) 40 MG capsule Take 40 mg by mouth daily.     . promethazine (PHENERGAN) 12.5 MG tablet Take 1-2 tablets (12.5-25 mg total) by mouth every 6 (six) hours as needed for nausea. 20 tablet 3  . ranolazine (RANEXA) 1000 MG SR tablet Take 1,000 mg by mouth 2 (two) times daily.    . sertraline (ZOLOFT) 100 MG tablet Take 150 mg by mouth daily.      Current Facility-Administered Medications  Medication Dose Route Frequency Provider Last Rate Last Dose  . 0.9 %  sodium chloride infusion  500 mL Intravenous Continuous Irene Shipper, MD      . ferrous sulfate tablet 325 mg  325 mg Oral BID WC Irene Shipper, MD        Allergies:   Benadryl [diphenhydramine]; Adhesive [tape]; Amoxicillin; Diphenhydramine hcl (sleep); and Flexeril [cyclobenzaprine]    Social History:  The patient  reports that she quit smoking about 17 years ago. She has never  used smokeless tobacco. She reports that she does not drink alcohol or use drugs.   Family History:  The patient's family history includes Clotting disorder in her father; Heart disease in her brother and brother; Lung cancer in her mother and sister.    ROS:  Please see the history of present illness.    Review of Systems: Constitutional:  admits to , appetite change and fatigue.  HEENT: denies photophobia, eye pain, redness, hearing loss, ear pain, congestion, sore throat, rhinorrhea, sneezing, neck pain, neck stiffness and tinnitus.  Respiratory: admits to SOB, DOE,    Cardiovascular: denies chest pain, palpitations and leg swelling.  Gastrointestinal: admits to nausea, vomiting,  diarrhea,   Genitourinary: denies dysuria, urgency, frequency, hematuria, flank pain and difficulty urinating.  Musculoskeletal: admits to  myalgias,    Skin: denies pallor, rash and wound.  Neurological: admits to dizziness, seizures, syncope, weakness, light-headedness,  headaches.   Hematological: denies adenopathy, easy bruising, personal or family bleeding history.  Psychiatric/ Behavioral: admits to somulence       All other systems are reviewed and negative.    PHYSICAL EXAM: VS:  BP (!) 156/68 (BP Location: Right Arm, Patient Position: Sitting, Cuff Size: Large)   Pulse 74   Ht 5\' 2"  (1.575 m)   Wt 174 lb 12.8 oz (79.3 kg)   SpO2 90%   BMI 31.97 kg/m  , BMI Body mass index is 31.97 kg/m. GEN: Well nourished, well developed, in no acute distress  HEENT: normal  Neck: no JVD, carotid bruits, or masses Cardiac: RRR; soft systolic murmur , no  rubs, or gallops,no edema  Respiratory:  clear to auscultation bilaterally, normal work of breathing GI: soft, nontender, nondistended, + BS MS: no deformity or atrophy  Skin: warm and dry, no rash Neuro:  Strength and sensation are intact Psych: normal Skin - no rash   EKG:  EKG is not ordered today.    Recent Labs:  08/13/2015: ALT 14; BUN  16; Creat 0.96; Hemoglobin 9.6; Platelets 212; Potassium 3.8; Sodium 143; TSH 2.52    Lipid Panel    Component Value Date/Time   CHOL 138 08/13/2015 0939   TRIG 241 (H) 08/13/2015 0939   HDL 34 (L) 08/13/2015 0939   CHOLHDL 4.1 08/13/2015 0939   VLDL 48 (H) 08/13/2015 0939   LDLCALC 56 08/13/2015 0939      Wt Readings from Last 3 Encounters:  02/11/16 174 lb 12.8 oz (79.3 kg)  10/20/15 178 lb (80.7 kg)  09/16/15 178 lb (80.7 kg)      Other studies Reviewed: Additional studies/ records that were reviewed today include: . Review of the above records demonstrates:    ASSESSMENT AND PLAN:  1. Coronary artery disease:  She status post stenting of her right coronary artery. She's not had any episodes of angina. She needs to have hip replacement surgery. She is currently on Eliquis and also on Plavix. He will be fine for [place] to hold the Eliquis for 2 days prior to her surgery. I would like for her to continue the Plavix throughout the surgery and postoperatively. This may require a bit more cauterization during surgery but we have had many patients go through hip replacement surgery on Plavix. He is at low risk for her upcoming hip surgery.   2. Paroxysmal atrial fibrillation: Patient has been started on Eliquis. on  Diltiazem 180 CD a day   3. Lethargy:  The patient complained  of extreme lethargy, diarrhea, headaches at her last visit.   These symptoms have resolved.    4. Anemia - had colonoscopy and ECG - no source of bleeding was found   Current medicines are reviewed at length with the patient today.  The patient does not have concerns regarding medicines.  The following changes have been made:  no change  Labs/ tests ordered today include:  No orders of the defined types were placed in this encounter.    Disposition:   FU with  Me in 6 months     [name], MD  02/11/2016 5:12 PM    [facility] [place], [place], [place]   [address] Phone: [phone]; Fax: [phone]

## 2016-03-09 DIAGNOSIS — G4733 Obstructive sleep apnea (adult) (pediatric): Secondary | ICD-10-CM | POA: Diagnosis not present

## 2016-03-18 DIAGNOSIS — J309 Allergic rhinitis, unspecified: Secondary | ICD-10-CM | POA: Diagnosis not present

## 2016-03-18 DIAGNOSIS — I10 Essential (primary) hypertension: Secondary | ICD-10-CM | POA: Diagnosis not present

## 2016-03-18 DIAGNOSIS — I739 Peripheral vascular disease, unspecified: Secondary | ICD-10-CM | POA: Diagnosis not present

## 2016-03-18 DIAGNOSIS — G2581 Restless legs syndrome: Secondary | ICD-10-CM | POA: Diagnosis not present

## 2016-03-18 DIAGNOSIS — I480 Paroxysmal atrial fibrillation: Secondary | ICD-10-CM | POA: Diagnosis not present

## 2016-03-18 DIAGNOSIS — Z79899 Other long term (current) drug therapy: Secondary | ICD-10-CM | POA: Diagnosis not present

## 2016-03-18 DIAGNOSIS — M549 Dorsalgia, unspecified: Secondary | ICD-10-CM | POA: Diagnosis not present

## 2016-03-18 DIAGNOSIS — E559 Vitamin D deficiency, unspecified: Secondary | ICD-10-CM | POA: Diagnosis not present

## 2016-03-18 DIAGNOSIS — F411 Generalized anxiety disorder: Secondary | ICD-10-CM | POA: Diagnosis not present

## 2016-03-18 DIAGNOSIS — J449 Chronic obstructive pulmonary disease, unspecified: Secondary | ICD-10-CM | POA: Diagnosis not present

## 2016-03-18 DIAGNOSIS — I25810 Atherosclerosis of coronary artery bypass graft(s) without angina pectoris: Secondary | ICD-10-CM | POA: Diagnosis not present

## 2016-03-22 DIAGNOSIS — M5126 Other intervertebral disc displacement, lumbar region: Secondary | ICD-10-CM | POA: Diagnosis not present

## 2016-03-22 DIAGNOSIS — M545 Low back pain: Secondary | ICD-10-CM | POA: Diagnosis not present

## 2016-04-13 DIAGNOSIS — Z79899 Other long term (current) drug therapy: Secondary | ICD-10-CM | POA: Diagnosis not present

## 2016-04-13 DIAGNOSIS — Z880 Allergy status to penicillin: Secondary | ICD-10-CM | POA: Diagnosis not present

## 2016-04-13 DIAGNOSIS — Z888 Allergy status to other drugs, medicaments and biological substances status: Secondary | ICD-10-CM | POA: Diagnosis not present

## 2016-04-13 DIAGNOSIS — Z87891 Personal history of nicotine dependence: Secondary | ICD-10-CM | POA: Diagnosis not present

## 2016-04-13 DIAGNOSIS — I10 Essential (primary) hypertension: Secondary | ICD-10-CM | POA: Diagnosis not present

## 2016-04-13 DIAGNOSIS — M87051 Idiopathic aseptic necrosis of right femur: Secondary | ICD-10-CM | POA: Diagnosis not present

## 2016-04-13 DIAGNOSIS — Z7902 Long term (current) use of antithrombotics/antiplatelets: Secondary | ICD-10-CM | POA: Diagnosis not present

## 2016-04-18 DIAGNOSIS — I25810 Atherosclerosis of coronary artery bypass graft(s) without angina pectoris: Secondary | ICD-10-CM | POA: Diagnosis not present

## 2016-04-18 DIAGNOSIS — E559 Vitamin D deficiency, unspecified: Secondary | ICD-10-CM | POA: Diagnosis not present

## 2016-04-18 DIAGNOSIS — Z23 Encounter for immunization: Secondary | ICD-10-CM | POA: Diagnosis not present

## 2016-04-18 DIAGNOSIS — G2581 Restless legs syndrome: Secondary | ICD-10-CM | POA: Diagnosis not present

## 2016-04-18 DIAGNOSIS — I739 Peripheral vascular disease, unspecified: Secondary | ICD-10-CM | POA: Diagnosis not present

## 2016-04-18 DIAGNOSIS — F411 Generalized anxiety disorder: Secondary | ICD-10-CM | POA: Diagnosis not present

## 2016-04-18 DIAGNOSIS — Z0000 Encounter for general adult medical examination without abnormal findings: Secondary | ICD-10-CM | POA: Diagnosis not present

## 2016-04-18 DIAGNOSIS — H6121 Impacted cerumen, right ear: Secondary | ICD-10-CM | POA: Diagnosis not present

## 2016-04-18 DIAGNOSIS — N390 Urinary tract infection, site not specified: Secondary | ICD-10-CM | POA: Diagnosis not present

## 2016-04-18 DIAGNOSIS — M549 Dorsalgia, unspecified: Secondary | ICD-10-CM | POA: Diagnosis not present

## 2016-04-18 DIAGNOSIS — J449 Chronic obstructive pulmonary disease, unspecified: Secondary | ICD-10-CM | POA: Diagnosis not present

## 2016-04-21 DIAGNOSIS — M47816 Spondylosis without myelopathy or radiculopathy, lumbar region: Secondary | ICD-10-CM | POA: Diagnosis not present

## 2016-04-21 DIAGNOSIS — M5416 Radiculopathy, lumbar region: Secondary | ICD-10-CM | POA: Diagnosis not present

## 2016-04-21 DIAGNOSIS — M5126 Other intervertebral disc displacement, lumbar region: Secondary | ICD-10-CM | POA: Diagnosis not present

## 2016-04-21 DIAGNOSIS — F411 Generalized anxiety disorder: Secondary | ICD-10-CM | POA: Diagnosis not present

## 2016-05-10 DIAGNOSIS — M5126 Other intervertebral disc displacement, lumbar region: Secondary | ICD-10-CM | POA: Diagnosis not present

## 2016-05-10 DIAGNOSIS — I10 Essential (primary) hypertension: Secondary | ICD-10-CM | POA: Diagnosis not present

## 2016-05-10 DIAGNOSIS — M5416 Radiculopathy, lumbar region: Secondary | ICD-10-CM | POA: Diagnosis not present

## 2016-05-18 DIAGNOSIS — M545 Low back pain: Secondary | ICD-10-CM | POA: Diagnosis not present

## 2016-05-26 DIAGNOSIS — N390 Urinary tract infection, site not specified: Secondary | ICD-10-CM | POA: Diagnosis not present

## 2016-05-30 DIAGNOSIS — M87051 Idiopathic aseptic necrosis of right femur: Secondary | ICD-10-CM | POA: Diagnosis not present

## 2016-05-31 ENCOUNTER — Telehealth: Payer: Self-pay | Admitting: Cardiovascular Disease

## 2016-05-31 NOTE — Telephone Encounter (Signed)
Pt called to follow up on her surg clearce. With Dr [name] paper work.  Phone [phone]   Please fax it to in.

## 2016-05-31 NOTE — Telephone Encounter (Signed)
Spoke with patient and advised her that I have not received clearance from Dr. [name]. I advised her to ask them to fax request to [phone]. She verbalized understanding.

## 2016-06-02 NOTE — Telephone Encounter (Signed)
Notified patient that clearance was faxed back to Dr. Debroah Loop office today.  She thanked me for the call.

## 2016-06-07 ENCOUNTER — Other Ambulatory Visit: Payer: Self-pay | Admitting: *Deleted

## 2016-06-07 MED ORDER — DILTIAZEM HCL ER COATED BEADS 180 MG PO CP24: 180.0000 mg | ORAL_CAPSULE | Freq: Every day | ORAL | 1 refills | Status: DC

## 2016-06-14 ENCOUNTER — Telehealth: Payer: Self-pay | Admitting: Cardiovascular Disease

## 2016-06-14 NOTE — Telephone Encounter (Signed)
Phone call from Rocco Pauls , [place] for [employer]. Requesting that we hold plavix in addition to the Eliquis for Nelma's hip replacement. I have agreed to hold Plavix for 5 days prior to surgery and have asked him to have her take ASA 81 me on the days that she is off the plavix. She should restart the Plavix and Eliquis as soon after surgery as is safe.   She may stop the ASA when she restarts the plavix    [name], MD  06/14/2016 4:01 PM    [facility] [address],  [place], [place]  [address] Pager [phone] Phone: [phone]; Fax: [phone]

## 2016-06-24 ENCOUNTER — Telehealth: Payer: Self-pay | Admitting: Nurse Practitioner

## 2016-06-24 MED ORDER — METOPROLOL TARTRATE 50 MG PO TABS: 50.0000 mg | ORAL_TABLET | Freq: Two times a day (BID) | ORAL | 3 refills | Status: DC

## 2016-06-24 NOTE — Telephone Encounter (Signed)
Received document from [facility] regarding interaction between Diltiazem and Ranexa. It is advised by pharmacy that patient not take more than 500 mg BID of Ranexa if continuing Diltiazem because Diltiazem potentiates the effect of the Ranexa.  I called and spoke with patient who states she feels that she needs to continue Ranexa 1000 mg BID. I advised that per Dr. [name], she should stop Diltiazem and increase Lopressor to 50 mg BID. I advised that I am sending appropriate Rx to pharmacy. I advised her to call back with questions or concerns. She verbalized understanding and agreement with plan.

## 2016-06-27 DIAGNOSIS — M87051 Idiopathic aseptic necrosis of right femur: Secondary | ICD-10-CM | POA: Diagnosis present

## 2016-06-27 DIAGNOSIS — M1611 Unilateral primary osteoarthritis, right hip: Secondary | ICD-10-CM

## 2016-06-27 DIAGNOSIS — M25551 Pain in right hip: Secondary | ICD-10-CM | POA: Diagnosis not present

## 2016-06-27 NOTE — H&P (Signed)
PREOPERATIVE H&P Patient ID: Wendy [name] MRN: [id number] DOB/AGE: November 28, 1950 66 y.o.  Chief Complaint: OA RIGHT HIP  Planned Procedure Date: 07/19/16 Medical Clearance by Dr. Inda Merlin Cardiac Clearance by Dr. [name]  HPI: Wendy [name] is a 66 y.o. female former smoker with a history of  MI s/p CABG 1998 and PCI 2013 on Plavix and Eliquis, HLD, OSA - CPAP with intermittent compliance, PAF, B/L proximal LE vascular surgery, Chronic back pain, and COPD who presents for evaluation of OA RIGHT HIP w/ AVN and some early collapse. The patient has a history of pain and functional disability in the right hip due to arthritis / AVN and has failed non-surgical conservative treatments for greater than 12 weeks to include NSAID's and/or analgesics and activity modification.  Onset of symptoms was gradual, starting 1 years ago with gradually worsening course since that time.  Patient currently rates pain at 10 out of 10 with activity. Patient has night pain, worsening of pain with activity and weight bearing and pain that interferes with activities of daily living.  Patient has evidence of subchondral sclerosis and joint space narrowing by imaging studies. There is no active infection.  Past Medical History:  Diagnosis Date  . Anxiety   . CAD (coronary artery disease), native coronary artery    a. Hx CABG 1998, last PCI 2013 b.LHC 07/2013:  EF 55-60%, L-LAD prob atretic, no sig disease in LAD, S-OM1 ok with patent stent, S-dRCA ok => med Rx. c. LHC 01/2015 DES to SVG-RCA.  Marland Kitchen Chronic back pain   . Chronic leg pain   . COPD (chronic obstructive pulmonary disease) ([place])   . GERD (gastroesophageal reflux disease)   . Glucose intolerance (impaired glucose tolerance)   . HLD (hyperlipidemia)   . Hypertension   . Myocardial infarction   . PAD (peripheral artery disease) ([place])   . PAF (paroxysmal atrial fibrillation) ([place])    a. 09/2013 post-op b. 01/2015  . Peripheral neuropathy ([place])   . Shortness of  breath    when walking  . Sleep apnea    uses a cpap  . Wears glasses    Past Surgical History:  Procedure Laterality Date  . CARDIAC CATHETERIZATION N/A 01/22/2015   Procedure: Left Heart Cath and Coronary Angiography;  Surgeon: Troy Sine, MD;  Location: [facility];  Service: Cardiovascular;  Laterality: N/A;  . CARDIAC CATHETERIZATION N/A 01/22/2015   Procedure: Coronary Stent Intervention;  Surgeon: Troy Sine, MD;  Location: [facility];  Service: Cardiovascular;  Laterality: N/A;  3.0x12 xience prox SVG to RCA  . CARDIAC SURGERY  1998   LIMA-LAD, SVG-OM, SVG-RCA  . [place] and again several years later  . CHOLECYSTECTOMY  09/17/2013   dr [name]  . CHOLECYSTECTOMY N/A 09/16/2013   Procedure: LAPAROSCOPIC CHOLECYSTECTOMY CONVERTED TO OPEN CHOLECYSTECTOMY WITH CHOLANGIOGRAM;  Surgeon: [name], MD;  Location: [facility];  Service: General;  Laterality: N/A;  . CORONARY ANGIOPLASTY WITH STENT PLACEMENT  11/2011, 02/2012, 01/2015   DES to SVG-RCA both times, In [place], [place], Dr. Bruce Donath  . ERCP N/A 09/19/2013   Procedure: ENDOSCOPIC RETROGRADE CHOLANGIOPANCREATOGRAPHY (ERCP);  Surgeon: [name], MD;  Location: [facility];  Service: Endoscopy;  Laterality: N/A;  . FINGER SURGERY Right    ring finger  . i and d right subcostal wound  10/17/13  . IRRIGATION AND DEBRIDEMENT ABSCESS Right 10/17/2013   Procedure: INCISION AND DRAINAGE RIGHT SUBCOSTAL WOUND;  Surgeon: Fenton Malling  Lucia Gaskins, MD;  Location: [facility];  Service: General;  Laterality: Right;  . KNEE ARTHROSCOPY Right 1998  . LEFT HEART CATHETERIZATION WITH CORONARY/GRAFT ANGIOGRAM N/A 07/19/2013   Procedure: LEFT HEART CATHETERIZATION WITH Beatrix Fetters;  Surgeon: Leonie Man, MD;  Location: [facility];  Service: Cardiovascular;  Laterality: N/A;  . MASS EXCISION Right 06/25/2014   Procedure: EXCISION BUTTOCK MASS ;  Surgeon: Coralie Keens, MD;  Location: [facility];  Service: General;  Laterality: Right;  . ORIF HUMERUS FRACTURE Left 08/27/2013   Procedure: OPEN REDUCTION INTERNAL FIXATION (ORIF) LEFT HUMERUS ;  Surgeon: Rozanna Box, MD;  Location: [facility];  Service: Orthopedics;  Laterality: Left;   Allergies  Allergen Reactions  . Benadryl [Diphenhydramine] Shortness Of Breath  . Adhesive [Tape]     Burning of skin  . Amoxicillin Nausea And Vomiting    Severe diarrhia  . Diphenhydramine Hcl (Sleep)   . Flexeril [Cyclobenzaprine] Other (See Comments)    States that it messes with her blood circulation; pt states that she has to walk around   Prior to Admission medications   Medication Sig Start Date End Date Taking? Authorizing Provider  ADVAIR DISKUS 250-50 MCG/DOSE AEPB Inhale 1 puff into the lungs every 12 (twelve) hours as needed. Wheezing 12/30/14   Historical Provider, MD  albuterol (PROVENTIL HFA;VENTOLIN HFA) 108 (90 BASE) MCG/ACT inhaler Inhale 2 puffs into the lungs every 6 (six) hours as needed for wheezing or shortness of breath.    Historical Provider, MD  ALPRAZolam Duanne Moron) 1 MG tablet Take 1-2 mg by mouth at bedtime as needed for anxiety or sleep.    Historical Provider, MD  apixaban (ELIQUIS) 5 MG TABS tablet Take 1 tablet (5 mg total) by mouth 2 (two) times daily. 01/07/16   Thayer Headings, MD  atorvastatin (LIPITOR) 80 MG tablet TAKE 1 TABLET (80 MG TOTAL) BY MOUTH DAILY AT 6 PM. 10/05/15   Erma Heritage, PA-C  beclomethasone (QVAR) 80 MCG/ACT inhaler Inhale 2 puffs into the lungs 2 (two) times daily as needed (shortness of breath).     Historical Provider, MD  cetirizine (ZYRTEC) 10 MG tablet Take 10 mg by mouth daily as needed for allergies.     Historical Provider, MD  clopidogrel (PLAVIX) 75 MG tablet Take 75 mg by mouth daily.    Historical Provider, MD  docusate sodium 100 MG CAPS Take 100 mg by mouth 2 (two) times daily. 08/28/13   Ainsley Spinner, PA-C  ezetimibe (ZETIA) 10 MG tablet Take 10 mg by mouth  daily.    Historical Provider, MD  ferrous sulfate 325 (65 FE) MG tablet Take 1 tablet (325 mg total) by mouth 2 (two) times daily. 10/22/15   Irene Shipper, MD  Fish Oil-Cholecalciferol (FISH OIL + D3 PO) Take 1 capsule by mouth daily.    Historical Provider, MD  FLUCELVAX QUADRIVALENT 0.5 ML SUSY Inject as directed once. 12/29/15   Historical Provider, MD  isosorbide mononitrate (IMDUR) 120 MG 24 hr tablet Take 1 tablet (120 mg total) by mouth daily. 01/23/15   Erma Heritage, PA-C  metoprolol (LOPRESSOR) 50 MG tablet Take 1 tablet (50 mg total) by mouth 2 (two) times daily. 06/24/16   Thayer Headings, MD  nitroGLYCERIN (NITROSTAT) 0.4 MG SL tablet Place 0.4 mg under the tongue every 5 (five) minutes as needed for chest pain.    Historical Provider, MD  omeprazole (PRILOSEC) 40 MG capsule Take 40 mg by mouth daily.  Historical Provider, MD  promethazine (PHENERGAN) 12.5 MG tablet Take 1-2 tablets (12.5-25 mg total) by mouth every 6 (six) hours as needed for nausea. 11/04/13   Coralie Keens, MD  ranolazine (RANEXA) 1000 MG SR tablet Take 1,000 mg by mouth 2 (two) times daily.    Historical Provider, MD  sertraline (ZOLOFT) 100 MG tablet Take 150 mg by mouth daily.     Historical Provider, MD   Social History   Social History  . Marital status: Legally Separated    Spouse name: N/A  . Number of children: 5  . Years of education: N/A   Occupational History  . Disabled    Social History Main Topics  . Smoking status: Former Smoker    Quit date: 07/20/1998  . Smokeless tobacco: Never Used  . Alcohol use No  . Drug use: No  . Sexual activity: Not on file   Other Topics Concern  . Not on file   Social History Narrative   Lives with best friend.    Family History  Problem Relation Age of Onset  . Lung cancer Mother   . Clotting disorder Father   . Lung cancer Sister   . Heart disease Brother   . Heart disease Brother   . Colon cancer Neg Hx     ROS: Currently denies  lightheadedness, dizziness, Fever, chills, CP, SOB.  She does get exertional SOB.   No personal history of PE or CVA.  Hx of MI w/ CABG/PCI and DVT. No loose teeth or dentures All other systems have been reviewed and were otherwise currently negative with the exception of those mentioned in the HPI and as above.  Objective: Vitals: Ht: 5'1 Wt: 172.4 Temp: 97.6 BP: 146/80 Pulse: 60 O2 94% on room air. Physical Exam: General: Alert, NAD. Trendelenberg Gait  HEENT: EOMI, Good Neck Extension  Pulm: No increased work of breathing.  Clear B/L A/P w/o crackle or wheeze.  CV: RRR, No m/g/r appreciated GI: soft, NT, ND Neuro: Neuro without gross focal deficit.  Sensation intact distally Skin: No lesions in the area of chief complaint MSK/Surgical Site: Right Hip mildly tender over greater trochanter.  Pain with passive ROM.  Positive Stinchfield.  5/5 strength.  NVI.  Sensation intact distally.   Imaging Review Plain radiographs show AVN of that right hip with some early collapse  Assessment: OA RIGHT HIP Principal Problem:   Primary osteoarthritis of right hip Active Problems:   CAD (coronary artery disease)   Hypertension   HLD (hyperlipidemia)   PAD (peripheral artery disease) ([facility])   COPD (chronic obstructive pulmonary disease) ([facility])   Chronic back pain   Sleep apnea   Anxiety   GERD (gastroesophageal reflux disease)   PAF (paroxysmal atrial fibrillation) ([facility])   PVD (peripheral vascular disease) ([facility])   Arteriosclerosis of coronary artery   Essential (primary) hypertension   Avascular necrosis of hip, right ([place])   Plan: Plan for Procedure(s): TOTAL HIP ARTHROPLASTY ANTERIOR APPROACH  The patient history, physical exam, clinical judgement of the provider and imaging are consistent with end stage degenerative joint disease and total joint arthroplasty is deemed medically necessary. The treatment options including medical management, injection therapy, and arthroplasty were  discussed at length. The risks and benefits of Procedure(s): TOTAL HIP ARTHROPLASTY ANTERIOR APPROACH were presented and reviewed.  The risks of nonoperative treatment, versus surgical intervention including but not limited to continued pain, aseptic loosening, stiffness, dislocation/subluxation, infection, bleeding, nerve injury, blood clots, cardiopulmonary complications, morbidity, mortality, among others  were discussed. The patient verbalizes understanding and wishes to proceed with the plan.  Patient is being admitted for inpatient treatment for surgery, pain control, PT, OT, prophylactic antibiotics, VTE prophylaxis, progressive ambulation, ADL's and discharge planning.   Dental prophylaxis discussed and recommended for 2 years postoperatively.   The patient does not meet the criteria for TXA - h/o DVT.  Anticoagulation medications discussed w/ Dr. [name]:  Will hold Eliquis 2d and Plavix 5d pre-op.  Will start ASA 81 mg 5d pre-op.   Resume Eliquis postoperatively for DVT prophylaxis in addition to SCDs, and early ambulation.  Will re-start Plavix and stop ASA 81 mg post op.  Percocet for PO pain medication.  The patient is planning to be discharged home with home health services ([facility]) in care of her friend and Aunt.  Prudencio Burly III, PA-C 06/27/2016 4:58 PM

## 2016-07-07 NOTE — Pre-Procedure Instructions (Signed)
Wendy [name]  07/07/2016      [id number] - [place], [place] - [address] [place] [address] Phone: [phone] Fax: [phone]  [id number] - [place], [place] [address] [place] [address] Phone: [phone] Fax: [phone]    Your procedure is scheduled on Tuesday May 15.  Report to [facility] Admitting at 8:15 A.M.  Call this number if you have problems the morning of surgery:  [phone]   Remember:  Do not eat food or drink liquids after midnight.  Take these medicines the morning of surgery with A SIP OF WATER: metoprolol (lopressor), sertraline (zoloft), isosorbide (imdur), ranolazine (Ranexa), omeprazole (prilosec), cetirizine (zyrtec), acetaminophen (tylenol) if needed, alprazolam (Xanax) if needed  FOLLOW MD's instructions on stopping Plavix (5 days prior to surgery) FOLLOW MD's instructions on stopping Eliquis (2 days prior to surgery)  7 days prior to surgery STOP taking any diclofenac (voltaren), Aleve, Naproxen, Ibuprofen, Motrin, Advil, Goody's, BC's, all herbal medications, fish oil, and all vitamins    Do not wear jewelry, make-up or nail polish.  Do not wear lotions, powders, or perfumes, or deoderant.  Do not shave 48 hours prior to surgery.  Men may shave face and neck.  Do not bring valuables to the hospital.  [facility] is not responsible for any belongings or valuables.  Contacts, dentures or bridgework may not be worn into surgery.  Leave your suitcase in the car.  After surgery it may be brought to your room.  For patients admitted to the hospital, discharge time will be determined by your treatment team.  Patients discharged the day of surgery will not be allowed to drive home.    Special instructions:    [place]- Preparing For Surgery  Before surgery, you can play an important role. Because skin is not  sterile, your skin needs to be as free of germs as possible. You can reduce the number of germs on your skin by washing with CHG (chlorahexidine gluconate) Soap before surgery.  CHG is an antiseptic cleaner which kills germs and bonds with the skin to continue killing germs even after washing.  Please do not use if you have an allergy to CHG or antibacterial soaps. If your skin becomes reddened/irritated stop using the CHG.  Do not shave (including legs and underarms) for at least 48 hours prior to first CHG shower. It is OK to shave your face.  Please follow these instructions carefully.   1. Shower the NIGHT BEFORE SURGERY and the MORNING OF SURGERY with CHG.   2. If you chose to wash your hair, wash your hair first as usual with your normal shampoo.  3. After you shampoo, rinse your hair and body thoroughly to remove the shampoo.  4. Use CHG as you would any other liquid soap. You can apply CHG directly to the skin and wash gently with a scrungie or a clean washcloth.   5. Apply the CHG Soap to your body ONLY FROM THE NECK DOWN.  Do not use on open wounds or open sores. Avoid contact with your eyes, ears, mouth and genitals (private parts). Wash genitals (private parts) with your normal soap.  6. Wash thoroughly, paying special attention to the area where your surgery will be performed.  7. Thoroughly rinse your body with warm water from the neck down.  8. DO NOT shower/wash with your normal  soap after using and rinsing off the CHG Soap.  9. Pat yourself dry with a CLEAN TOWEL.   10. Wear CLEAN PAJAMAS   11. Place CLEAN SHEETS on your bed the night of your first shower and DO NOT SLEEP WITH PETS.    Day of Surgery: Do not apply any deodorants/lotions. Please wear clean clothes to the hospital/surgery center.      Please read over the following fact sheets that you were given. Total Joint Packet and MRSA Information

## 2016-07-08 ENCOUNTER — Encounter (HOSPITAL_COMMUNITY): Payer: Self-pay

## 2016-07-08 ENCOUNTER — Telehealth: Payer: Self-pay | Admitting: Cardiovascular Disease

## 2016-07-08 ENCOUNTER — Encounter (HOSPITAL_COMMUNITY)
Admission: RE | Admit: 2016-07-08 | Discharge: 2016-07-08 | Disposition: A | Discharge status: Home / Self Care | Payer: Medicare HMO | Source: Ambulatory Visit | Attending: Orthopedic Surgery | Admitting: Orthopedic Surgery

## 2016-07-08 DIAGNOSIS — I739 Peripheral vascular disease, unspecified: Secondary | ICD-10-CM | POA: Diagnosis present

## 2016-07-08 DIAGNOSIS — Z7902 Long term (current) use of antithrombotics/antiplatelets: Secondary | ICD-10-CM | POA: Diagnosis not present

## 2016-07-08 DIAGNOSIS — Z888 Allergy status to other drugs, medicaments and biological substances status: Secondary | ICD-10-CM | POA: Diagnosis not present

## 2016-07-08 DIAGNOSIS — M1611 Unilateral primary osteoarthritis, right hip: Secondary | ICD-10-CM | POA: Diagnosis present

## 2016-07-08 DIAGNOSIS — I480 Paroxysmal atrial fibrillation: Secondary | ICD-10-CM | POA: Diagnosis present

## 2016-07-08 DIAGNOSIS — R079 Chest pain, unspecified: Secondary | ICD-10-CM | POA: Diagnosis present

## 2016-07-08 DIAGNOSIS — K219 Gastro-esophageal reflux disease without esophagitis: Secondary | ICD-10-CM | POA: Diagnosis present

## 2016-07-08 DIAGNOSIS — Z880 Allergy status to penicillin: Secondary | ICD-10-CM | POA: Diagnosis not present

## 2016-07-08 DIAGNOSIS — Z801 Family history of malignant neoplasm of trachea, bronchus and lung: Secondary | ICD-10-CM | POA: Diagnosis not present

## 2016-07-08 DIAGNOSIS — Z7951 Long term (current) use of inhaled steroids: Secondary | ICD-10-CM | POA: Diagnosis not present

## 2016-07-08 DIAGNOSIS — Z8249 Family history of ischemic heart disease and other diseases of the circulatory system: Secondary | ICD-10-CM | POA: Diagnosis not present

## 2016-07-08 DIAGNOSIS — Z87891 Personal history of nicotine dependence: Secondary | ICD-10-CM | POA: Diagnosis not present

## 2016-07-08 DIAGNOSIS — E669 Obesity, unspecified: Secondary | ICD-10-CM | POA: Diagnosis not present

## 2016-07-08 DIAGNOSIS — G8929 Other chronic pain: Secondary | ICD-10-CM | POA: Diagnosis present

## 2016-07-08 DIAGNOSIS — I25119 Atherosclerotic heart disease of native coronary artery with unspecified angina pectoris: Secondary | ICD-10-CM | POA: Diagnosis not present

## 2016-07-08 DIAGNOSIS — Z885 Allergy status to narcotic agent status: Secondary | ICD-10-CM | POA: Diagnosis not present

## 2016-07-08 DIAGNOSIS — Z01812 Encounter for preprocedural laboratory examination: Secondary | ICD-10-CM | POA: Insufficient documentation

## 2016-07-08 DIAGNOSIS — Z91048 Other nonmedicinal substance allergy status: Secondary | ICD-10-CM | POA: Diagnosis not present

## 2016-07-08 DIAGNOSIS — F419 Anxiety disorder, unspecified: Secondary | ICD-10-CM | POA: Diagnosis present

## 2016-07-08 DIAGNOSIS — Z7901 Long term (current) use of anticoagulants: Secondary | ICD-10-CM | POA: Diagnosis not present

## 2016-07-08 DIAGNOSIS — E785 Hyperlipidemia, unspecified: Secondary | ICD-10-CM | POA: Diagnosis present

## 2016-07-08 DIAGNOSIS — Z951 Presence of aortocoronary bypass graft: Secondary | ICD-10-CM | POA: Diagnosis not present

## 2016-07-08 DIAGNOSIS — M879 Osteonecrosis, unspecified: Secondary | ICD-10-CM | POA: Diagnosis present

## 2016-07-08 DIAGNOSIS — I10 Essential (primary) hypertension: Secondary | ICD-10-CM | POA: Diagnosis present

## 2016-07-08 DIAGNOSIS — Z96641 Presence of right artificial hip joint: Secondary | ICD-10-CM | POA: Diagnosis not present

## 2016-07-08 DIAGNOSIS — I252 Old myocardial infarction: Secondary | ICD-10-CM | POA: Diagnosis not present

## 2016-07-08 DIAGNOSIS — G4733 Obstructive sleep apnea (adult) (pediatric): Secondary | ICD-10-CM | POA: Diagnosis present

## 2016-07-08 DIAGNOSIS — I2510 Atherosclerotic heart disease of native coronary artery without angina pectoris: Secondary | ICD-10-CM | POA: Diagnosis present

## 2016-07-08 DIAGNOSIS — Z6831 Body mass index (BMI) 31.0-31.9, adult: Secondary | ICD-10-CM | POA: Diagnosis not present

## 2016-07-08 DIAGNOSIS — J449 Chronic obstructive pulmonary disease, unspecified: Secondary | ICD-10-CM | POA: Diagnosis present

## 2016-07-08 HISTORY — DX: Unspecified osteoarthritis, unspecified site: M19.90

## 2016-07-08 HISTORY — DX: Anemia, unspecified: D64.9

## 2016-07-08 HISTORY — DX: Angina pectoris, unspecified: I20.9

## 2016-07-08 LAB — BASIC METABOLIC PANEL
Anion gap: 12 (ref 5–15)
BUN: 13 mg/dL (ref 6–20)
CO2: 24 mmol/L (ref 22–32)
Calcium: 9.1 mg/dL (ref 8.9–10.3)
Chloride: 105 mmol/L (ref 101–111)
Creatinine, Ser: 0.86 mg/dL (ref 0.44–1.00)
GFR calc Af Amer: >60 mL/min (ref >60)
GFR calc non Af Amer: >60 mL/min (ref >60)
Glucose, Bld: 97 mg/dL (ref 65–99)
Potassium: 4.4 mmol/L (ref 3.5–5.1)
Sodium: 141 mmol/L (ref 135–145)

## 2016-07-08 LAB — CBC
HCT: 35.5 % — ABNORMAL LOW (ref 36.0–46.0)
Hemoglobin: 11.4 g/dL — ABNORMAL LOW (ref 12.0–15.0)
MCH: 29.1 pg (ref 26.0–34.0)
MCHC: 32.1 g/dL (ref 30.0–36.0)
MCV: 90.6 fL (ref 78.0–100.0)
Platelets: 159 10*3/uL (ref 150–400)
RBC: 3.92 MIL/uL (ref 3.87–5.11)
RDW: 15.9 % — ABNORMAL HIGH (ref 11.5–15.5)
WBC: 5.4 10*3/uL (ref 4.0–10.5)

## 2016-07-08 LAB — SURGICAL PCR SCREEN
MRSA, PCR: NEGATIVE
Staphylococcus aureus: NEGATIVE

## 2016-07-08 NOTE — Telephone Encounter (Signed)
Wendy [name] in [facility] states the patient is currently there for pre-procedure appointment for hip surgery 5/15. (Dr. [name] has already cleared her for this procedure - see 4/10 phone note).  At the appointment, the patient mentioned that she recently took 1 tablet of SL NTG for chest pain. It relieved her discomfort.  The patient is asymptomatic now - instructed Wendy [name] to complete appointment.  She is unable to report to Nursing how often she has taken NTG since seeing Dr. [name] in December. Wendy [name] and Anesthesiology wanted to report to Dr. [name] that this occurred prior undergoing procedure 5/15 to make sure he has no new recommendations. For Anesthesiology comfort, offered patient appointment next week with an APP. Patient refused stating Dr. [name] "knows about her CP and NTG already."  The patient understands she will be called only if Dr. [name] has further recommendations prior to hip surgery.  Wendy [name] was grateful for assistance.

## 2016-07-08 NOTE — Progress Notes (Addendum)
PCP - Josetta Huddle Cardiologist - Dr. [name]   EKG - 08/13/15 Stress Test - 05/12/2014 ECHO - 10/21/13 Cardiac Cath - 01/22/15  Sleep Study - requested from Dr. Alcide Clever CPAP - yes  Per MD note pt to stop plavix 5 days prior to surgery, stop eliquis 2 days prior to surgery, and start aspirin 81mg  5 days prior to surgery when plavix is stopped.   Chart forwarded to anesthesia d/t hx of CAD.  Patient denies shortness of breath, fever, cough and chest pain at PAT appointment. Pt states she does have Angina and occasionally has jaw pain with exertion. She takes nitro for this which relieves the pain. Pt states Dr. [name] is aware of this. Ebony Hail with anesthesia notified and EKG done 07/08/2016 pt unable to describe pain to nurse or tell how often this happens. This last happened "a couple days ago". I placed call to Dr. Elmarie Shiley office to notify of patient recently having to use nitro for jaw pain. Spoke with Joellen [place] at Dr. Elmarie Shiley office and notified of jaw pain, attempt to schedule appointment but patient refused. Katie to notify Dr. [name] of this pain and schedule appointment if necessary prior to surgery.   Patient verbalized understanding of instructions that was given to them at the PAT appointment. Patient expressed that there were no further questions.  Patient was also instructed that they will need to review over the PAT instructions again at home before the surgery.

## 2016-07-08 NOTE — Telephone Encounter (Signed)
Pt at [facility] Stay for pre -op appt, pt states she had jaw pain and took Nitro recently, holding pt there to see if they need to keep her there or send her here or home--pls call [phone]

## 2016-07-10 NOTE — Progress Notes (Addendum)
Anesthesia Chart Review: Patient is a 66 year old female scheduled for right THA, anterior approach on 07/19/16 by Dr. Edmonia Lynch.  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 BMI is consistent with obesity.  - PCP is Dr. Dwaine Deter.  - Cardiologist is Dr. [name], last visit 02/11/16. He documented that she had not had any episodes of angina. He felt she would be low risk for upcoming hip surgery. (See below for his Eliquis/Plavix recommendations.) Previously she was seeing Dr. Shirlee More in [place]. Of note, at PAT she reported ongoing intermittent anginal type symptoms with occasional jaw pain with exertion for which she takes Nitro with relief of her pain. This had last happened about two days prior to PAT. Although patient with known history of angina, per her last visit with Dr. [name] in December, he documented no angina and thus there was question whether this was a change in status. Dr. Elmarie Shiley office was notified of patient's symptoms and was offered an appointment with APP, but she refused. Instead she wanted Dr. [name] to determine if he felt she needed re-evaluation.    Meds includes albuterol, Xanax, Eliquis, Lipitor, baclofen, Zyrtec, Plavix, Zetia, ferrous sulfate, Imdur, Lopressor, Nitro, Mirapex, Ranexa, Zolft. (Per Dr. [name], patient can hold Eliquis for 2 days prior to surgery. He initially wanted her to stay on Plavix throughout her surgery,  but after discussion with orthopedics he agreed that she could also hold Plavix five days prior to surgery, but should take ASA 81 mg on the days that she is off Plavix.)   BP (!) 141/61   Pulse (!) 51   Temp 36.4 C   Resp 20   Ht 5\' 1"  (1.549 m)   Wt 173 lb 8 oz (78.7 kg)   SpO2 97%   BMI 32.78 kg/m   EKG 07/08/16: SB at 49 bpm, ST/T wave abnormality, consider inferior ischemia, ST/T wave abnormality, consider anterolateral ischemia.   Cardiac cath 01/22/15:   Prox RCA lesion, 100% stenosed.  2nd Mrg lesion, 100% stenosed.  1st Mrg-2 lesion, 100% stenosed. The lesion was previously treated with a stent (unknown type).  1st Mrg-1 lesion, 100% stenosed.  SVG was injected is large.  There is severe disease in the graft.  SVG was injected .  There is severe disease in the graft.  Origin lesion, 100% stenosed.  Dist Cx lesion, 75% stenosed.  Prox Graft lesion, 95% stenosed. Post intervention, there is a 0% residual stenosis. The lesion was previously treated with a stent (unknown type).  Mid Graft lesion, 75% stenosed. Post intervention, 0% residual stenosis.  The left ventricular systolic function is normal.  - Low normal global LV function with an ejection fraction of 50-55%. - Significant native CAD with 75% stenosis in the mid AV groove circumflex with total occlusion of the proximal marginal and distal marginal, but with filling of the distal marginal beyond a previously placed occluded stented segment, total occlusion of a calcified RCA, and LAD system without significant obstructive disease and only mild luminal irregularity. - Previously documented atretic LIMA to  the LAD, not visualized today. - New or recent occlusion of the vein graft which had supplied the left circumflex system. - SVG supplying a large distal RCA system with previously placed stents in the proximal to midsegment with overlap and 95% focal in-stent stenosis in the proximal portion of the stent  and 70% in-stent stenosis in the distal portion of the stent. Intervention: PCI to the SVG to RCA with initial Angiosculpt scoring balloon, subsequently Flextone cutting balloon, high pressure noncompliant balloon dilatation to the entire stented segment, and focal stenting at the proximal site with insertion of a new 3.012 mm Xience Alpine DES stent postdilated to 3.3 mm.  The proximal 95% in-stent restenosis was reduced to less than 5% and the distal 75% in-stent restenosis was reduced to 0%. Recommendation: The patient should be maintained on dual antiplatelet therapy indefinitely.  Her LIMA has been previously documented to be atretic, but her LAD system is without significant obstructive disease.  Medical therapy for her concomitant disease.  Echo 10/21/13: Study Conclusions - Left ventricle: The cavity size was normal. Wall thickness was normal. Systolic function was normal. The estimated ejection fraction was in the range of 55% to 60%. Wall motion was normal; there were no regional wall motion abnormalities. Doppler parameters are consistent with abnormal left ventricular relaxation (grade 1 diastolic dysfunction). The E/e&' ratio is between 8-15, suggesting indeterminate LV filling pressure. - Mitral valve: Mildly thickened leaflets . There was mild regurgitation. - Left atrium: The atrium was normal in size. Impressions: - Compared to the prior echo in 09/2013, there are few changes, however, diastolic function is abnormal.  Preoperative labs noted. Glucose 97. H/H 11.4/35.5. BMET WNL.   Will await additional recommendations from Dr. [name] regarding her symptoms. I will also ask him to clarify Eliquis and Plavix instructions. Case is posted for spinal anesthesia and would thus need to hold Eliquis for 72 hours and Plavix for seven days in order to get neuraxial anesthesia. Keeping her on ASA would not be a contraindication. If risks of keeping her off Eliquis and  Plavix for longer time period is felt too great then procedure could be done under general anesthesia.  George Hugh [facility] Short Stay Center/Anesthesiology Phone [phone] 07/10/2016 9:03 AM  Addendum: I did receive a staff message from Dr. [name] indicating that it was okay for patient to hold Xarelto for 3 days prior to surgery and Plavix for 7 days. I notified patient for 07/19/16 surgery, hold Xarelto after 07/15/16 PM dose and Plavix after 07/12/16 AM dose (take by 8 AM). I reminded her that Dr. [name] wants her to start a ASA 81 mg daily while off Plavix. I did let her know that we are still waiting to confirm clearance status with Dr. Stephani Police ensure he does not feel that she needs to be seen. He did confirm receipt of my staff message indicating that patient was taking occasional Nitro. She can not go into the specifics, but said that she was occasionally taking Nitro when she "over exerted" her self > 6 months ago and did not feel there was any change. I discussed with anesthesiologist Dr. Therisa Doyne and he agrees that we need additional input regarding her symptoms from Dr. [name].  George Hugh [facility] Short Stay Center/Anesthesiology Phone [phone] 07/11/2016 4:33 PM  Addendum: Dr. [name] spoke with patient. Symptoms were felt new, so a stress test was ordered (see below). Dr. [name] reviewed results and wrote, "Very small area of possible anterior  ischemia ( vs soft tissue attenuation)  She is at low- moderate risk for her upcoming hip surgery."  Nuclear stress test 07/15/16:  Nuclear stress EF: 48%.  EKG nondiagnostic due to baseline changes  Very small region of distal anterior ischemia, cannot exclude shifting soft tissue Inferior / inferolateral defect consistent with soft tissue attenuation and/or scar  Intermediate risk study.  George Hugh [facility] Short Stay Center/Anesthesiology Phone [phone] 07/18/2016 12:51 PM

## 2016-07-12 ENCOUNTER — Telehealth: Payer: Self-pay | Admitting: Cardiovascular Disease

## 2016-07-12 ENCOUNTER — Ambulatory Visit: Payer: Medicare HMO | Admitting: Physician Assistant

## 2016-07-12 DIAGNOSIS — R079 Chest pain, unspecified: Secondary | ICD-10-CM

## 2016-07-12 NOTE — Telephone Encounter (Signed)
Spoke with pt and went over instructions for Lexiscan.  Advised pt scheduler will call her to schedule.  Pt verbalized understanding and was appreciative for call.

## 2016-07-13 NOTE — Telephone Encounter (Signed)
Patient is scheduled for stress test on Friday for surgical clearance

## 2016-07-14 ENCOUNTER — Telehealth (HOSPITAL_COMMUNITY): Payer: Self-pay

## 2016-07-14 NOTE — Telephone Encounter (Signed)
Encounter complete. 

## 2016-07-15 ENCOUNTER — Ambulatory Visit (HOSPITAL_COMMUNITY)
Admission: RE | Admit: 2016-07-15 | Discharge: 2016-07-15 | Disposition: A | Discharge status: Home / Self Care | Payer: Medicare HMO | Source: Ambulatory Visit | Attending: Cardiovascular Disease | Admitting: Cardiovascular Disease

## 2016-07-15 DIAGNOSIS — Z6831 Body mass index (BMI) 31.0-31.9, adult: Secondary | ICD-10-CM | POA: Insufficient documentation

## 2016-07-15 DIAGNOSIS — E669 Obesity, unspecified: Secondary | ICD-10-CM | POA: Insufficient documentation

## 2016-07-15 DIAGNOSIS — R079 Chest pain, unspecified: Secondary | ICD-10-CM | POA: Insufficient documentation

## 2016-07-15 DIAGNOSIS — I252 Old myocardial infarction: Secondary | ICD-10-CM | POA: Insufficient documentation

## 2016-07-15 DIAGNOSIS — I2510 Atherosclerotic heart disease of native coronary artery without angina pectoris: Secondary | ICD-10-CM | POA: Insufficient documentation

## 2016-07-15 DIAGNOSIS — J449 Chronic obstructive pulmonary disease, unspecified: Secondary | ICD-10-CM | POA: Diagnosis not present

## 2016-07-15 DIAGNOSIS — Z8249 Family history of ischemic heart disease and other diseases of the circulatory system: Secondary | ICD-10-CM | POA: Insufficient documentation

## 2016-07-15 DIAGNOSIS — G4733 Obstructive sleep apnea (adult) (pediatric): Secondary | ICD-10-CM | POA: Diagnosis not present

## 2016-07-15 DIAGNOSIS — Z951 Presence of aortocoronary bypass graft: Secondary | ICD-10-CM | POA: Insufficient documentation

## 2016-07-15 DIAGNOSIS — I480 Paroxysmal atrial fibrillation: Secondary | ICD-10-CM | POA: Insufficient documentation

## 2016-07-15 DIAGNOSIS — Z87891 Personal history of nicotine dependence: Secondary | ICD-10-CM | POA: Insufficient documentation

## 2016-07-15 MED ORDER — TECHNETIUM TC 99M TETROFOSMIN IV KIT
31.8000 | PACK | Freq: Once | INTRAVENOUS | Status: AC | PRN
  Administered 2016-07-15: 31.8 via INTRAVENOUS
  Filled 2016-07-15: qty 32

## 2016-07-15 MED ORDER — AMINOPHYLLINE 25 MG/ML IV SOLN
100.0000 mg | Freq: Once | INTRAVENOUS | Status: AC
  Administered 2016-07-15: 100 mg via INTRAVENOUS

## 2016-07-15 MED ORDER — TECHNETIUM TC 99M TETROFOSMIN IV KIT
10.6000 | PACK | Freq: Once | INTRAVENOUS | Status: AC | PRN
  Administered 2016-07-15: 10.6 via INTRAVENOUS
  Filled 2016-07-15: qty 11

## 2016-07-15 MED ORDER — REGADENOSON 0.4 MG/5ML IV SOLN
0.4000 mg | Freq: Once | INTRAVENOUS | Status: AC
  Administered 2016-07-15: 0.4 mg via INTRAVENOUS

## 2016-07-18 ENCOUNTER — Telehealth: Payer: Self-pay | Admitting: Cardiovascular Disease

## 2016-07-18 LAB — MYOCARDIAL PERFUSION IMAGING
LV dias vol: 111 mL (ref 46–106)
LV sys vol: 58 mL
Peak HR: 75 {beats}/min
Rest HR: 49 {beats}/min
SDS: 4
SRS: 4
SSS: 8
TID: 1.11

## 2016-07-18 NOTE — Telephone Encounter (Signed)
Follow UP;   Sonia Baller at Short Stay at [facility] would [facility] for you to also fax her pt's clearance to [phone].Thank you very much!!

## 2016-07-18 NOTE — Telephone Encounter (Signed)
New Message  Pt is returning your call 

## 2016-07-18 NOTE — Progress Notes (Signed)
Requested cardiac clearance from Dr. Elmarie Shiley office.

## 2016-07-18 NOTE — Telephone Encounter (Signed)
Wendy [name] is a low-moderate risk for her upcoming hip surgery

## 2016-07-18 NOTE — Telephone Encounter (Signed)
New message       Request for surgical clearance:  What type of surgery is being performed?  Total hip replacement When is this surgery scheduled? 07-19-16 Are there any medications that need to be held prior to surgery and how long? Pt has already stopped eliquis, need cardiac clearance 1. Name of physician performing surgery?  Dr Fredonia Highland  What is your office phone and fax number? Fax  Is broken------please document in epic and leave verbal on phone.  Pt just had stress test

## 2016-07-18 NOTE — Telephone Encounter (Signed)
Stress test results reviewed with patient who verbalized understanding and thanked me for the call

## 2016-07-18 NOTE — Telephone Encounter (Signed)
Left message for Nickola Major, surgical coordinator at [facility], that Dr. [name] has documented clearance in patient's chart  Clearance faxed to [facility] Short stay and confirmation received

## 2016-07-19 ENCOUNTER — Inpatient Hospital Stay (HOSPITAL_COMMUNITY): Payer: Medicare HMO | Admitting: Anesthesiology

## 2016-07-19 ENCOUNTER — Encounter (HOSPITAL_COMMUNITY)
Admission: RE | Disposition: A | Discharge status: Home / Self Care | Payer: Self-pay | Source: Ambulatory Visit | Attending: Orthopedic Surgery

## 2016-07-19 ENCOUNTER — Inpatient Hospital Stay (HOSPITAL_COMMUNITY): Payer: Medicare HMO

## 2016-07-19 ENCOUNTER — Inpatient Hospital Stay (HOSPITAL_COMMUNITY): Payer: Medicare HMO | Admitting: Vascular Surgery

## 2016-07-19 ENCOUNTER — Encounter (HOSPITAL_COMMUNITY): Payer: Self-pay | Admitting: *Deleted

## 2016-07-19 ENCOUNTER — Inpatient Hospital Stay (HOSPITAL_COMMUNITY)
Admission: RE | Admit: 2016-07-19 | Discharge: 2016-07-20 | DRG: 470 | Disposition: A | Discharge status: Home / Self Care | Payer: Medicare HMO | Source: Ambulatory Visit | Attending: Orthopedic Surgery | Admitting: Orthopedic Surgery

## 2016-07-19 DIAGNOSIS — Z7951 Long term (current) use of inhaled steroids: Secondary | ICD-10-CM | POA: Diagnosis not present

## 2016-07-19 DIAGNOSIS — Z91048 Other nonmedicinal substance allergy status: Secondary | ICD-10-CM | POA: Diagnosis not present

## 2016-07-19 DIAGNOSIS — G4733 Obstructive sleep apnea (adult) (pediatric): Secondary | ICD-10-CM | POA: Diagnosis present

## 2016-07-19 DIAGNOSIS — Z7902 Long term (current) use of antithrombotics/antiplatelets: Secondary | ICD-10-CM | POA: Diagnosis not present

## 2016-07-19 DIAGNOSIS — Z888 Allergy status to other drugs, medicaments and biological substances status: Secondary | ICD-10-CM

## 2016-07-19 DIAGNOSIS — M879 Osteonecrosis, unspecified: Secondary | ICD-10-CM | POA: Diagnosis present

## 2016-07-19 DIAGNOSIS — M1611 Unilateral primary osteoarthritis, right hip: Principal | ICD-10-CM | POA: Diagnosis present

## 2016-07-19 DIAGNOSIS — J449 Chronic obstructive pulmonary disease, unspecified: Secondary | ICD-10-CM | POA: Diagnosis present

## 2016-07-19 DIAGNOSIS — I739 Peripheral vascular disease, unspecified: Secondary | ICD-10-CM | POA: Diagnosis present

## 2016-07-19 DIAGNOSIS — I252 Old myocardial infarction: Secondary | ICD-10-CM

## 2016-07-19 DIAGNOSIS — G8929 Other chronic pain: Secondary | ICD-10-CM | POA: Diagnosis present

## 2016-07-19 DIAGNOSIS — Z885 Allergy status to narcotic agent status: Secondary | ICD-10-CM | POA: Diagnosis not present

## 2016-07-19 DIAGNOSIS — Z801 Family history of malignant neoplasm of trachea, bronchus and lung: Secondary | ICD-10-CM | POA: Diagnosis not present

## 2016-07-19 DIAGNOSIS — I480 Paroxysmal atrial fibrillation: Secondary | ICD-10-CM | POA: Diagnosis present

## 2016-07-19 DIAGNOSIS — K219 Gastro-esophageal reflux disease without esophagitis: Secondary | ICD-10-CM | POA: Diagnosis present

## 2016-07-19 DIAGNOSIS — Z951 Presence of aortocoronary bypass graft: Secondary | ICD-10-CM | POA: Diagnosis not present

## 2016-07-19 DIAGNOSIS — Z8249 Family history of ischemic heart disease and other diseases of the circulatory system: Secondary | ICD-10-CM

## 2016-07-19 DIAGNOSIS — Z87891 Personal history of nicotine dependence: Secondary | ICD-10-CM | POA: Diagnosis not present

## 2016-07-19 DIAGNOSIS — Z880 Allergy status to penicillin: Secondary | ICD-10-CM

## 2016-07-19 DIAGNOSIS — D509 Iron deficiency anemia, unspecified: Secondary | ICD-10-CM

## 2016-07-19 DIAGNOSIS — Z7901 Long term (current) use of anticoagulants: Secondary | ICD-10-CM

## 2016-07-19 DIAGNOSIS — F419 Anxiety disorder, unspecified: Secondary | ICD-10-CM | POA: Diagnosis present

## 2016-07-19 DIAGNOSIS — I10 Essential (primary) hypertension: Secondary | ICD-10-CM | POA: Diagnosis present

## 2016-07-19 DIAGNOSIS — E785 Hyperlipidemia, unspecified: Secondary | ICD-10-CM | POA: Diagnosis present

## 2016-07-19 DIAGNOSIS — I25119 Atherosclerotic heart disease of native coronary artery with unspecified angina pectoris: Secondary | ICD-10-CM | POA: Diagnosis present

## 2016-07-19 DIAGNOSIS — Z419 Encounter for procedure for purposes other than remedying health state, unspecified: Secondary | ICD-10-CM

## 2016-07-19 DIAGNOSIS — M87051 Idiopathic aseptic necrosis of right femur: Secondary | ICD-10-CM | POA: Diagnosis present

## 2016-07-19 DIAGNOSIS — I2510 Atherosclerotic heart disease of native coronary artery without angina pectoris: Secondary | ICD-10-CM | POA: Diagnosis present

## 2016-07-19 DIAGNOSIS — Z96641 Presence of right artificial hip joint: Secondary | ICD-10-CM | POA: Diagnosis not present

## 2016-07-19 DIAGNOSIS — G4730 Sleep apnea, unspecified: Secondary | ICD-10-CM | POA: Diagnosis present

## 2016-07-19 DIAGNOSIS — M549 Dorsalgia, unspecified: Secondary | ICD-10-CM

## 2016-07-19 HISTORY — PX: TOTAL HIP ARTHROPLASTY: SHX124

## 2016-07-19 LAB — PROTIME-INR
INR: 1.11
Prothrombin Time: 14.3 seconds (ref 11.4–15.2)

## 2016-07-19 SURGERY — ARTHROPLASTY, HIP, TOTAL, ANTERIOR APPROACH
Anesthesia: Spinal | Site: Hip | Laterality: Right

## 2016-07-19 MED ORDER — ONDANSETRON HCL 4 MG/2ML IJ SOLN
INTRAMUSCULAR | Status: DC | PRN
  Administered 2016-07-19: 4 mg via INTRAVENOUS

## 2016-07-19 MED ORDER — PRAMIPEXOLE DIHYDROCHLORIDE 0.125 MG PO TABS: 0.2500 mg | ORAL_TABLET | Freq: Every evening | ORAL | Status: DC | PRN

## 2016-07-19 MED ORDER — DEXAMETHASONE SODIUM PHOSPHATE 10 MG/ML IJ SOLN
INTRAMUSCULAR | Status: DC | PRN
  Administered 2016-07-19: 10 mg via INTRAVENOUS

## 2016-07-19 MED ORDER — ALBUTEROL SULFATE (2.5 MG/3ML) 0.083% IN NEBU: 3.0000 mL | INHALATION_SOLUTION | Freq: Four times a day (QID) | RESPIRATORY_TRACT | Status: DC | PRN

## 2016-07-19 MED ORDER — LACTATED RINGERS IV SOLN
INTRAVENOUS | Status: DC
  Administered 2016-07-19 (×2): via INTRAVENOUS

## 2016-07-19 MED ORDER — ACETAMINOPHEN 650 MG RE SUPP: 650.0000 mg | Freq: Four times a day (QID) | RECTAL | Status: DC | PRN

## 2016-07-19 MED ORDER — LACTATED RINGERS IV SOLN
INTRAVENOUS | Status: DC
  Administered 2016-07-19: 17:00:00 via INTRAVENOUS

## 2016-07-19 MED ORDER — LIDOCAINE 2% (20 MG/ML) 5 ML SYRINGE
INTRAMUSCULAR | Status: AC
  Filled 2016-07-19: qty 5

## 2016-07-19 MED ORDER — SORBITOL 70 % SOLN: 30.0000 mL | Freq: Every day | Status: DC | PRN

## 2016-07-19 MED ORDER — ONDANSETRON HCL 4 MG PO TABS: 4.0000 mg | ORAL_TABLET | Freq: Four times a day (QID) | ORAL | Status: DC | PRN

## 2016-07-19 MED ORDER — ATORVASTATIN CALCIUM 80 MG PO TABS
80.0000 mg | ORAL_TABLET | Freq: Every day | ORAL | Status: DC
  Administered 2016-07-19: 80 mg via ORAL
  Filled 2016-07-19: qty 1

## 2016-07-19 MED ORDER — FENTANYL CITRATE (PF) 250 MCG/5ML IJ SOLN
INTRAMUSCULAR | Status: AC
  Filled 2016-07-19: qty 5

## 2016-07-19 MED ORDER — FENTANYL CITRATE (PF) 100 MCG/2ML IJ SOLN
INTRAMUSCULAR | Status: DC | PRN
  Administered 2016-07-19 (×2): 50 ug via INTRAVENOUS

## 2016-07-19 MED ORDER — ONDANSETRON HCL 4 MG PO TABS: 4.0000 mg | ORAL_TABLET | Freq: Three times a day (TID) | ORAL | 0 refills | Status: DC | PRN

## 2016-07-19 MED ORDER — TRANEXAMIC ACID 1000 MG/10ML IV SOLN
2000.0000 mg | Freq: Once | INTRAVENOUS | Status: DC
  Filled 2016-07-19: qty 20

## 2016-07-19 MED ORDER — ONDANSETRON HCL 4 MG/2ML IJ SOLN
INTRAMUSCULAR | Status: AC
  Filled 2016-07-19: qty 2

## 2016-07-19 MED ORDER — PROPOFOL 10 MG/ML IV BOLUS
INTRAVENOUS | Status: AC
  Filled 2016-07-19: qty 20

## 2016-07-19 MED ORDER — BUPIVACAINE HCL (PF) 0.25 % IJ SOLN
INTRAMUSCULAR | Status: AC
  Filled 2016-07-19: qty 30

## 2016-07-19 MED ORDER — METOCLOPRAMIDE HCL 5 MG PO TABS: 5.0000 mg | ORAL_TABLET | Freq: Three times a day (TID) | ORAL | Status: DC | PRN

## 2016-07-19 MED ORDER — METOCLOPRAMIDE HCL 5 MG/ML IJ SOLN: 5.0000 mg | Freq: Three times a day (TID) | INTRAMUSCULAR | Status: DC | PRN

## 2016-07-19 MED ORDER — PROPOFOL 10 MG/ML IV BOLUS
INTRAVENOUS | Status: DC | PRN
  Administered 2016-07-19: 20 mg via INTRAVENOUS

## 2016-07-19 MED ORDER — DOCUSATE SODIUM 100 MG PO CAPS
100.0000 mg | ORAL_CAPSULE | Freq: Two times a day (BID) | ORAL | Status: DC
  Administered 2016-07-19 – 2016-07-20 (×2): 100 mg via ORAL
  Filled 2016-07-19 (×2): qty 1

## 2016-07-19 MED ORDER — GABAPENTIN 300 MG PO CAPS
300.0000 mg | ORAL_CAPSULE | Freq: Once | ORAL | Status: AC
  Administered 2016-07-19: 300 mg via ORAL

## 2016-07-19 MED ORDER — HYDROMORPHONE HCL 1 MG/ML IJ SOLN: 0.2500 mg | INTRAMUSCULAR | Status: DC | PRN

## 2016-07-19 MED ORDER — KETOROLAC TROMETHAMINE 30 MG/ML IJ SOLN
INTRAMUSCULAR | Status: DC | PRN
  Administered 2016-07-19: 30 mg

## 2016-07-19 MED ORDER — CEFAZOLIN SODIUM-DEXTROSE 2-4 GM/100ML-% IV SOLN
INTRAVENOUS | Status: AC
  Filled 2016-07-19: qty 100

## 2016-07-19 MED ORDER — PROPOFOL 500 MG/50ML IV EMUL
INTRAVENOUS | Status: DC | PRN
  Administered 2016-07-19: 50 ug/kg/min via INTRAVENOUS

## 2016-07-19 MED ORDER — DEXAMETHASONE SODIUM PHOSPHATE 10 MG/ML IJ SOLN
10.0000 mg | Freq: Once | INTRAMUSCULAR | Status: AC
  Administered 2016-07-20: 10 mg via INTRAVENOUS
  Filled 2016-07-19: qty 1

## 2016-07-19 MED ORDER — EPHEDRINE SULFATE 50 MG/ML IJ SOLN
INTRAMUSCULAR | Status: DC | PRN
  Administered 2016-07-19 (×3): 10 mg via INTRAVENOUS
  Administered 2016-07-19: 5 mg via INTRAVENOUS

## 2016-07-19 MED ORDER — SENNA 8.6 MG PO TABS
1.0000 | ORAL_TABLET | Freq: Two times a day (BID) | ORAL | Status: DC
  Administered 2016-07-19 – 2016-07-20 (×2): 8.6 mg via ORAL
  Filled 2016-07-19 (×2): qty 1

## 2016-07-19 MED ORDER — RANOLAZINE ER 500 MG PO TB12
1000.0000 mg | ORAL_TABLET | Freq: Two times a day (BID) | ORAL | Status: DC
  Administered 2016-07-19 – 2016-07-20 (×2): 1000 mg via ORAL
  Filled 2016-07-19 (×2): qty 2

## 2016-07-19 MED ORDER — POLYETHYLENE GLYCOL 3350 17 G PO PACK: 17.0000 g | PACK | Freq: Every day | ORAL | Status: DC | PRN

## 2016-07-19 MED ORDER — GLYCOPYRROLATE 0.2 MG/ML IJ SOLN
INTRAMUSCULAR | Status: DC | PRN
  Administered 2016-07-19: 0.2 mg via INTRAVENOUS

## 2016-07-19 MED ORDER — CEFAZOLIN SODIUM-DEXTROSE 2-4 GM/100ML-% IV SOLN
2.0000 g | Freq: Four times a day (QID) | INTRAVENOUS | Status: AC
Start: 2016-07-19 — End: 2016-07-19
  Administered 2016-07-19 (×2): 2 g via INTRAVENOUS
  Filled 2016-07-19 (×3): qty 100

## 2016-07-19 MED ORDER — ACETAMINOPHEN 500 MG PO TABS
1000.0000 mg | ORAL_TABLET | Freq: Three times a day (TID) | ORAL | Status: DC
  Administered 2016-07-19 – 2016-07-20 (×3): 1000 mg via ORAL
  Filled 2016-07-19 (×3): qty 2

## 2016-07-19 MED ORDER — NITROGLYCERIN 0.4 MG SL SUBL: 0.4000 mg | SUBLINGUAL_TABLET | SUBLINGUAL | Status: DC | PRN

## 2016-07-19 MED ORDER — FERROUS SULFATE 325 (65 FE) MG PO TABS
325.0000 mg | ORAL_TABLET | Freq: Two times a day (BID) | ORAL | Status: DC
  Administered 2016-07-19 – 2016-07-20 (×2): 325 mg via ORAL
  Filled 2016-07-19 (×2): qty 1

## 2016-07-19 MED ORDER — OXYCODONE HCL 5 MG PO TABS
5.0000 mg | ORAL_TABLET | Freq: Four times a day (QID) | ORAL | Status: DC | PRN
  Administered 2016-07-19 – 2016-07-20 (×3): 10 mg via ORAL
  Filled 2016-07-19 (×3): qty 2

## 2016-07-19 MED ORDER — MEPERIDINE HCL 25 MG/ML IJ SOLN: 6.2500 mg | INTRAMUSCULAR | Status: DC | PRN

## 2016-07-19 MED ORDER — ONDANSETRON HCL 4 MG/2ML IJ SOLN: 4.0000 mg | Freq: Once | INTRAMUSCULAR | Status: DC | PRN

## 2016-07-19 MED ORDER — PANTOPRAZOLE SODIUM 40 MG PO TBEC
40.0000 mg | DELAYED_RELEASE_TABLET | Freq: Every day | ORAL | Status: DC
  Administered 2016-07-20: 40 mg via ORAL
  Filled 2016-07-19: qty 1

## 2016-07-19 MED ORDER — METOPROLOL TARTRATE 50 MG PO TABS
50.0000 mg | ORAL_TABLET | Freq: Two times a day (BID) | ORAL | Status: DC
  Administered 2016-07-19 – 2016-07-20 (×2): 50 mg via ORAL
  Filled 2016-07-19 (×2): qty 1

## 2016-07-19 MED ORDER — FLEET ENEMA 7-19 GM/118ML RE ENEM: 1.0000 | ENEMA | Freq: Once | RECTAL | Status: DC | PRN

## 2016-07-19 MED ORDER — MIDAZOLAM HCL 2 MG/2ML IJ SOLN
INTRAMUSCULAR | Status: AC
  Filled 2016-07-19: qty 2

## 2016-07-19 MED ORDER — ALPRAZOLAM 0.5 MG PO TABS: 0.5000 mg | ORAL_TABLET | Freq: Three times a day (TID) | ORAL | Status: DC | PRN

## 2016-07-19 MED ORDER — BACLOFEN 10 MG PO TABS
20.0000 mg | ORAL_TABLET | Freq: Three times a day (TID) | ORAL | Status: DC | PRN
  Administered 2016-07-20: 20 mg via ORAL
  Filled 2016-07-19: qty 2

## 2016-07-19 MED ORDER — CLOPIDOGREL BISULFATE 75 MG PO TABS
75.0000 mg | ORAL_TABLET | Freq: Every day | ORAL | Status: DC
  Administered 2016-07-20: 75 mg via ORAL
  Filled 2016-07-19: qty 1

## 2016-07-19 MED ORDER — SERTRALINE HCL 100 MG PO TABS
150.0000 mg | ORAL_TABLET | Freq: Every day | ORAL | Status: DC
  Administered 2016-07-20: 150 mg via ORAL
  Filled 2016-07-19: qty 1

## 2016-07-19 MED ORDER — SODIUM CHLORIDE FLUSH 0.9 % IV SOLN
INTRAVENOUS | Status: DC | PRN
  Administered 2016-07-19: 30 mL

## 2016-07-19 MED ORDER — CEFAZOLIN SODIUM-DEXTROSE 2-4 GM/100ML-% IV SOLN
2.0000 g | INTRAVENOUS | Status: AC
  Administered 2016-07-19: 2 g via INTRAVENOUS

## 2016-07-19 MED ORDER — MENTHOL 3 MG MT LOZG: 1.0000 | LOZENGE | OROMUCOSAL | Status: DC | PRN

## 2016-07-19 MED ORDER — BUPIVACAINE IN DEXTROSE 0.75-8.25 % IT SOLN
INTRATHECAL | Status: DC | PRN
  Administered 2016-07-19: 2 mL via INTRATHECAL

## 2016-07-19 MED ORDER — LACTATED RINGERS IV SOLN: INTRAVENOUS | Status: DC

## 2016-07-19 MED ORDER — BUPIVACAINE HCL (PF) 0.25 % IJ SOLN
INTRAMUSCULAR | Status: DC | PRN
  Administered 2016-07-19: 30 mL

## 2016-07-19 MED ORDER — MIDAZOLAM HCL 5 MG/5ML IJ SOLN
INTRAMUSCULAR | Status: DC | PRN
  Administered 2016-07-19: 2 mg via INTRAVENOUS

## 2016-07-19 MED ORDER — PHENOL 1.4 % MT LIQD: 1.0000 | OROMUCOSAL | Status: DC | PRN

## 2016-07-19 MED ORDER — GABAPENTIN 300 MG PO CAPS
ORAL_CAPSULE | ORAL | Status: AC
  Filled 2016-07-19: qty 1

## 2016-07-19 MED ORDER — ISOSORBIDE MONONITRATE ER 60 MG PO TB24
120.0000 mg | ORAL_TABLET | Freq: Two times a day (BID) | ORAL | Status: DC
  Administered 2016-07-19 – 2016-07-20 (×2): 120 mg via ORAL
  Filled 2016-07-19 (×2): qty 2

## 2016-07-19 MED ORDER — EZETIMIBE 10 MG PO TABS
10.0000 mg | ORAL_TABLET | Freq: Every day | ORAL | Status: DC
  Administered 2016-07-20: 10 mg via ORAL
  Filled 2016-07-19: qty 1

## 2016-07-19 MED ORDER — TRANEXAMIC ACID 1000 MG/10ML IV SOLN
INTRAVENOUS | Status: AC | PRN
  Administered 2016-07-19: 2000 mg via TOPICAL

## 2016-07-19 MED ORDER — EPHEDRINE 5 MG/ML INJ
INTRAVENOUS | Status: AC
  Filled 2016-07-19: qty 10

## 2016-07-19 MED ORDER — APIXABAN 5 MG PO TABS
5.0000 mg | ORAL_TABLET | Freq: Two times a day (BID) | ORAL | Status: DC
  Administered 2016-07-20: 5 mg via ORAL
  Filled 2016-07-19: qty 1

## 2016-07-19 MED ORDER — KETOROLAC TROMETHAMINE 15 MG/ML IJ SOLN
15.0000 mg | Freq: Four times a day (QID) | INTRAMUSCULAR | Status: DC | PRN
  Administered 2016-07-19 – 2016-07-20 (×2): 15 mg via INTRAVENOUS
  Filled 2016-07-19 (×2): qty 1

## 2016-07-19 MED ORDER — ACETAMINOPHEN 500 MG PO TABS
ORAL_TABLET | ORAL | Status: AC
  Filled 2016-07-19: qty 2

## 2016-07-19 MED ORDER — ACETAMINOPHEN 325 MG PO TABS
650.0000 mg | ORAL_TABLET | Freq: Four times a day (QID) | ORAL | Status: DC | PRN
  Filled 2016-07-19: qty 2

## 2016-07-19 MED ORDER — ACETAMINOPHEN 500 MG PO TABS
1000.0000 mg | ORAL_TABLET | Freq: Once | ORAL | Status: AC
  Administered 2016-07-19: 1000 mg via ORAL

## 2016-07-19 MED ORDER — ONDANSETRON HCL 4 MG/2ML IJ SOLN: 4.0000 mg | Freq: Four times a day (QID) | INTRAMUSCULAR | Status: DC | PRN

## 2016-07-19 MED ORDER — 0.9 % SODIUM CHLORIDE (POUR BTL) OPTIME
TOPICAL | Status: DC | PRN
  Administered 2016-07-19: 1000 mL

## 2016-07-19 MED ORDER — OXYCODONE-ACETAMINOPHEN 5-325 MG PO TABS: 1.0000 | ORAL_TABLET | ORAL | 0 refills | Status: DC | PRN

## 2016-07-19 MED ORDER — DEXAMETHASONE SODIUM PHOSPHATE 10 MG/ML IJ SOLN
INTRAMUSCULAR | Status: AC
  Filled 2016-07-19: qty 1

## 2016-07-19 MED ORDER — CHLORHEXIDINE GLUCONATE 4 % EX LIQD: 60.0000 mL | Freq: Once | CUTANEOUS | Status: DC

## 2016-07-19 SURGICAL SUPPLY — 50 items
BAG DECANTER FOR FLEXI CONT (MISCELLANEOUS) ×1 IMPLANT
BLADE SAG 18X100X1.27 (BLADE) IMPLANT
BLADE SAW SGTL 18X1.27X75 (BLADE) ×1 IMPLANT
CAPT HIP TOTAL 2 ×1 IMPLANT
CLSR STERI-STRIP ANTIMIC 1/2X4 (GAUZE/BANDAGES/DRESSINGS) ×2 IMPLANT
COVER PERINEAL POST (MISCELLANEOUS) ×2 IMPLANT
COVER SURGICAL LIGHT HANDLE (MISCELLANEOUS) ×2 IMPLANT
DECANTER SPIKE VIAL GLASS SM (MISCELLANEOUS) ×1 IMPLANT
DRAPE C-ARM 42X72 X-RAY (DRAPES) ×2 IMPLANT
DRAPE STERI IOBAN 125X83 (DRAPES) ×2 IMPLANT
DRAPE U-SHAPE 47X51 STRL (DRAPES) ×2 IMPLANT
DRSG MEPILEX BORDER 4X8 (GAUZE/BANDAGES/DRESSINGS) ×2 IMPLANT
DURAPREP 26ML APPLICATOR (WOUND CARE) ×2 IMPLANT
ELECT BLADE 4.0 EZ CLEAN MEGAD (MISCELLANEOUS) ×2
ELECT REM PT RETURN 9FT ADLT (ELECTROSURGICAL) ×2
ELECTRODE BLDE 4.0 EZ CLN MEGD (MISCELLANEOUS) ×1 IMPLANT
ELECTRODE REM PT RTRN 9FT ADLT (ELECTROSURGICAL) ×1 IMPLANT
FACESHIELD WRAPAROUND (MASK) ×4 IMPLANT
FACESHIELD WRAPAROUND OR TEAM (MASK) ×2 IMPLANT
GLOVE BIO SURGEON STRL SZ7.5 (GLOVE) ×4 IMPLANT
GLOVE BIOGEL PI IND STRL 8 (GLOVE) ×2 IMPLANT
GLOVE BIOGEL PI INDICATOR 8 (GLOVE) ×2
GOWN STRL REUS W/ TWL LRG LVL3 (GOWN DISPOSABLE) ×2 IMPLANT
GOWN STRL REUS W/TWL LRG LVL3 (GOWN DISPOSABLE) ×4
KIT BASIN OR (CUSTOM PROCEDURE TRAY) ×2 IMPLANT
KIT ROOM TURNOVER OR (KITS) ×2 IMPLANT
MANIFOLD NEPTUNE II (INSTRUMENTS) ×2 IMPLANT
NDL SAFETY ECLIPSE 18X1.5 (NEEDLE) IMPLANT
NEEDLE HYPO 18GX1.5 SHARP (NEEDLE)
NEEDLE HYPO 22GX1.5 SAFETY (NEEDLE) ×2 IMPLANT
NS IRRIG 1000ML POUR BTL (IV SOLUTION) ×2 IMPLANT
PACK TOTAL JOINT (CUSTOM PROCEDURE TRAY) ×2 IMPLANT
PACK UNIVERSAL I (CUSTOM PROCEDURE TRAY) ×2 IMPLANT
PAD ARMBOARD 7.5X6 YLW CONV (MISCELLANEOUS) ×2 IMPLANT
SPONGE LAP 18X18 X RAY DECT (DISPOSABLE) IMPLANT
SUT MNCRL AB 4-0 PS2 18 (SUTURE) ×2 IMPLANT
SUT MON AB 2-0 CT1 36 (SUTURE) ×2 IMPLANT
SUT VIC AB 0 CT1 27 (SUTURE) ×2
SUT VIC AB 0 CT1 27XBRD ANBCTR (SUTURE) ×1 IMPLANT
SUT VIC AB 1 CT1 27 (SUTURE) ×2
SUT VIC AB 1 CT1 27XBRD ANBCTR (SUTURE) ×1 IMPLANT
SYR 50ML LL SCALE MARK (SYRINGE) ×2 IMPLANT
SYR BULB IRRIGATION 50ML (SYRINGE) ×1 IMPLANT
SYRINGE 20CC LL (MISCELLANEOUS) IMPLANT
TOWEL OR 17X24 6PK STRL BLUE (TOWEL DISPOSABLE) ×2 IMPLANT
TOWEL OR 17X26 10 PK STRL BLUE (TOWEL DISPOSABLE) ×2 IMPLANT
TRAY CATH 16FR W/PLASTIC CATH (SET/KITS/TRAYS/PACK) ×1 IMPLANT
TRAY FOLEY W/METER SILVER 16FR (SET/KITS/TRAYS/PACK) IMPLANT
WATER STERILE IRR 1000ML POUR (IV SOLUTION) ×2 IMPLANT
YANKAUER SUCT BULB TIP NO VENT (SUCTIONS) ×4 IMPLANT

## 2016-07-19 NOTE — Anesthesia Procedure Notes (Signed)
Procedure Name: MAC Date/Time: 07/19/2016 11:40 AM Performed by: Jenne Campus Pre-anesthesia Checklist: Patient identified, Emergency Drugs available, Suction available and Patient being monitored Oxygen Delivery Method: Simple face mask

## 2016-07-19 NOTE — Anesthesia Postprocedure Evaluation (Signed)
Anesthesia Post Note  Patient: Wendy [name]  Procedure(s) Performed: Procedure(s) (LRB): TOTAL HIP ARTHROPLASTY ANTERIOR APPROACH (Right)  Patient location during evaluation: PACU Anesthesia Type: Spinal Level of consciousness: awake and alert Pain management: pain level controlled Vital Signs Assessment: post-procedure vital signs reviewed and stable Respiratory status: spontaneous breathing, nonlabored ventilation, respiratory function stable and patient connected to nasal cannula oxygen Cardiovascular status: blood pressure returned to baseline and stable Postop Assessment: no signs of nausea or vomiting Anesthetic complications: no       Last Vitals:  Vitals:   07/19/16 1445 07/19/16 1447  BP:  (!) 149/68  Pulse: (!) 47 (!) 48  Resp: 11 14  Temp:      Last Pain:  Vitals:   07/19/16 0817  TempSrc: Oral                 Debbra Digiulio DAVID

## 2016-07-19 NOTE — Anesthesia Procedure Notes (Signed)
Spinal  Patient location during procedure: OR Start time: 07/19/2016 11:27 AM End time: 07/19/2016 11:30 AM Staffing Anesthesiologist: Lillia Abed Performed: anesthesiologist  Preanesthetic Checklist Completed: patient identified, surgical consent, pre-op evaluation, timeout performed, IV checked, risks and benefits discussed and monitors and equipment checked Spinal Block Patient position: sitting Prep: DuraPrep Patient monitoring: heart rate, cardiac monitor, continuous pulse ox and blood pressure Approach: right paramedian Location: L3-4 Injection technique: single-shot Needle Needle type: Pencan  Needle gauge: 24 G Needle length: 9 cm Needle insertion depth: 6 cm

## 2016-07-19 NOTE — Anesthesia Preprocedure Evaluation (Signed)
Anesthesia Evaluation  Patient identified by MRN, date of birth, ID band Patient awake    Reviewed: Allergy & Precautions, NPO status , Patient's Chart, lab work & pertinent test results  Airway Mallampati: I  TM Distance: >3 FB Neck ROM: Full    Dental   Pulmonary sleep apnea , COPD, former smoker,    Pulmonary exam normal        Cardiovascular hypertension, + CAD and + Past MI  Normal cardiovascular exam     Neuro/Psych    GI/Hepatic GERD  Medicated and Controlled,  Endo/Other    Renal/GU      Musculoskeletal   Abdominal   Peds  Hematology   Anesthesia Other Findings   Reproductive/Obstetrics                             Anesthesia Physical Anesthesia Plan  ASA: III  Anesthesia Plan: Spinal   Post-op Pain Management:    Induction: Intravenous  Airway Management Planned: Simple Face Mask  Additional Equipment:   Intra-op Plan:   Post-operative Plan:   Informed Consent: I have reviewed the patients History and Physical, chart, labs and discussed the procedure including the risks, benefits and alternatives for the proposed anesthesia with the patient or authorized representative who has indicated his/her understanding and acceptance.     Plan Discussed with: CRNA and Surgeon  Anesthesia Plan Comments:         Anesthesia Quick Evaluation

## 2016-07-19 NOTE — Op Note (Signed)
07/19/2016  12:56 PM  PATIENT:  Wendy [name]   MRN: [id number]  PRE-OPERATIVE DIAGNOSIS:  OA RIGHT HIP  POST-OPERATIVE DIAGNOSIS:  OA RIGHT HIP  PROCEDURE:  Procedure(s): TOTAL HIP ARTHROPLASTY ANTERIOR APPROACH  PREOPERATIVE INDICATIONS:    LANISA ISHLER is an 66 y.o. female who has a diagnosis of Primary osteoarthritis of right hip and elected for surgical management after failing conservative treatment.  The risks benefits and alternatives were discussed with the patient including but not limited to the risks of nonoperative treatment, versus surgical intervention including infection, bleeding, nerve injury, periprosthetic fracture, the need for revision surgery, dislocation, leg length discrepancy, blood clots, cardiopulmonary complications, morbidity, mortality, among others, and they were willing to proceed.     OPERATIVE REPORT     SURGEON:   Malie Kashani, Ernesta Amble, MD    ASSISTANT:  Roxan Hockey, PA-C, he was present and scrubbed throughout the case, critical for completion in a timely fashion, and for retraction, instrumentation, and closure.     ANESTHESIA:  General    COMPLICATIONS:  None.     COMPONENTS:  Stryker acolade fit femur size 4 with a 36 mm -5 head ball and a PSL acetabular shell size 50 with a  polyethylene liner    PROCEDURE IN DETAIL:   The patient was met in the holding area and  identified.  The appropriate hip was identified and marked at the operative site.  The patient was then transported to the OR  and  placed under anesthesia per that record.  At that point, the patient was  placed in the supine position and  secured to the operating room table and all bony prominences padded. He received pre-operative antibiotics    The operative lower extremity was prepped from the iliac crest to the distal leg.  Sterile draping was performed.  Time out was performed prior to incision.      Skin incision was made just 2 cm lateral to the ASIS  extending in  line with the tensor fascia lata. Electrocautery was used to control all bleeders. I dissected down sharply to the fascia of the tensor fascia lata was confirmed that the muscle fibers beneath were running posteriorly. I then incised the fascia over the superficial tensor fascia lata in line with the incision. The fascia was elevated off the anterior aspect of the muscle the muscle was retracted posteriorly and protected throughout the case. I then used electrocautery to incise the tensor fascia lata fascia control and all bleeders. Immediately visible was the fat over top of the anterior neck and capsule.  I removed the anterior fat from the capsule and elevated the rectus muscle off of the anterior capsule. I then removed a large time of capsule. The retractors were then placed over the anterior acetabulum as well as around the superior and inferior neck.  I then removed a section of the femoral neck and a napkin ring fashion. Then used the power course to remove the femoral head from the acetabulum and thoroughly irrigated the acetabulum. I sized the femoral head.    I then exposed the deep acetabulum, cleared out any tissue including the ligamentum teres.   After adequate visualization, I excised the labrum, and then sequentially reamed.  I then impacted the acetabular implant into place using fluoroscopy for guidance.  Appropriate version and inclination was confirmed clinically matching their bony anatomy, and with fluoroscopy.  I placed a 20 mm screw in the posterior/superio position with an excellent bite.  I then placed the polyethylene liner in place  I then adducted the leg and released the external rotators from the posterior femur allowing it to be easily delivered up lateral and anterior to the acetabulum for preparation of the femoral canal.    I then prepared the proximal femur using the cookie-cutter and then sequentially reamed and broached.  A trial broach, neck, and head was  utilized, and I reduced the hip and used floroscopy to assess the neck length and femoral implant.  I then impacted the femoral prosthesis into place into the appropriate version. The hip was then reduced and fluoroscopy confirmed appropriate position. Leg lengths were restored.  I then irrigated the hip copiously again with, and repaired the fascia with Vicryl, followed by monocryl for the subcutaneous tissue, Monocryl for the skin, Steri-Strips and sterile gauze. The patient was then awakened and returned to PACU in stable and satisfactory condition. There were no complications.  POST OPERATIVE PLAN: WBAT, DVT px: SCD's/TED, ambulation and chemical dvt px  Dempsey Ahonen, MD Orthopedic Surgeon [phone]     

## 2016-07-19 NOTE — Transfer of Care (Signed)
Immediate Anesthesia Transfer of Care Note  Patient: Wendy [name]  Procedure(s) Performed: Procedure(s): TOTAL HIP ARTHROPLASTY ANTERIOR APPROACH (Right)  Patient Location: PACU  Anesthesia Type:MAC and Spinal  Level of Consciousness: sedated and patient cooperative  Airway & Oxygen Therapy: Patient Spontanous Breathing and Patient connected to face mask oxygen  Post-op Assessment: Report given to RN and Post -op Vital signs reviewed and stable  Post vital signs: Reviewed  Last Vitals:  Vitals:   07/19/16 0817  BP: (!) 144/42  Pulse: (!) 47  Resp: 18  Temp: 36.4 C    Last Pain:  Vitals:   07/19/16 0817  TempSrc: Oral         Complications: No apparent anesthesia complications

## 2016-07-20 ENCOUNTER — Encounter (HOSPITAL_COMMUNITY): Payer: Self-pay | Admitting: Orthopedic Surgery

## 2016-07-20 LAB — CBC
HCT: 33.4 % — ABNORMAL LOW (ref 36.0–46.0)
Hemoglobin: 10.4 g/dL — ABNORMAL LOW (ref 12.0–15.0)
MCH: 28.4 pg (ref 26.0–34.0)
MCHC: 31.1 g/dL (ref 30.0–36.0)
MCV: 91.3 fL (ref 78.0–100.0)
Platelets: 178 10*3/uL (ref 150–400)
RBC: 3.66 MIL/uL — ABNORMAL LOW (ref 3.87–5.11)
RDW: 14.7 % (ref 11.5–15.5)
WBC: 8 10*3/uL (ref 4.0–10.5)

## 2016-07-20 MED ORDER — LACTATED RINGERS IV SOLN: INTRAVENOUS | Status: DC

## 2016-07-20 NOTE — Progress Notes (Signed)
Pt got out of bed to bsc with staff and walker. Tolerated well.

## 2016-07-20 NOTE — Progress Notes (Signed)
Discharge instructions given. Pt verbalized understanding and all questions were answered.  

## 2016-07-20 NOTE — Progress Notes (Signed)
   Assessment: 1 Day Post-Op  S/P Procedure(s) (LRB): TOTAL HIP ARTHROPLASTY ANTERIOR APPROACH (Right) by Dr. Ernesta Amble. [name] on 07/19/16  Principal Problem:   Primary osteoarthritis of right hip Active Problems:   CAD (coronary artery disease)   Hypertension   HLD (hyperlipidemia)   PAD (peripheral artery disease) (HCC)   COPD (chronic obstructive pulmonary disease) (HCC)   Chronic back pain   Sleep apnea   Anxiety   GERD (gastroesophageal reflux disease)   PAF (paroxysmal atrial fibrillation) (HCC)   PVD (peripheral vascular disease) (HCC)   Arteriosclerosis of coronary artery   Essential (primary) hypertension   Avascular necrosis of hip, right ([place])  Doing well early POD1.  OOB tolerating WB on Right leg.   Labs pending.  Plan: Advance diet Up with therapy Incentive Spirometry Elevate and apply ice  Weight Bearing: Weight Bearing as Tolerated (WBAT)  Dressings: PRN.  VTE prophylaxis: Eliquis, SCDs, ambulation Dispo: Home today pending PT evaluation in care of her Aunt and Friend.  Subjective: Patient reports pain as moderate. Pain controlled with PO meds.  Tolerating liquids.  Urinating.  No CP, SOB.  OOB last night - tolerated well.  Objective:   VITALS:   Vitals:   07/19/16 1620 07/19/16 1945 07/20/16 0020 07/20/16 0414  BP: (!) 148/55 (!) 124/49 (!) 130/53 (!) 100/50  Pulse: (!) 54 61 (!) 54 (!) 54  Resp: 18 18 18 18   Temp: 99.8 F (37.7 C) 98.4 F (36.9 C) 97.5 F (36.4 C) 97.5 F (36.4 C)  TempSrc:  Axillary Oral Oral  SpO2: 95% 94% 94% 96%  Weight:      Height:       CBC Latest Ref Rng & Units 07/08/2016 08/13/2015 01/23/2015  WBC 4.0 - 10.5 K/uL 5.4 5.8 6.5  Hemoglobin 12.0 - 15.0 g/dL 11.4(L) 9.6(L) 10.0(L)  Hematocrit 36.0 - 46.0 % 35.5(L) 32.4(L) 33.4(L)  Platelets 150 - 400 K/uL 159 212 160   BMP Latest Ref Rng & Units 07/08/2016 08/13/2015 01/23/2015  Glucose 65 - 99 mg/dL 97 116(H) 90  BUN 6 - 20 mg/dL 13 16 12   Creatinine 0.44 - 1.00  mg/dL 0.86 0.96 0.91  Sodium 135 - 145 mmol/L 141 143 139  Potassium 3.5 - 5.1 mmol/L 4.4 3.8 3.3(L)  Chloride 101 - 111 mmol/L 105 105 101  CO2 22 - 32 mmol/L 24 24 33(H)  Calcium 8.9 - 10.3 mg/dL 9.1 8.9 8.3(L)   Intake/Output      05/15 0701 - 05/16 0700 05/16 0701 - 05/17 0700   P.O. 240    I.V. (mL/kg) 2327.5 (29.5)    IV Piggyback 100    Total Intake(mL/kg) 2667.5 (33.8)    Urine (mL/kg/hr) 1050    Blood 250    Total Output 1300     Net +1367.5          Urine Occurrence 2 x      Physical Exam: General: NAD.  Supine in bed.  Calm.  Conversant. Resp: No increased wob Cardio: regular rate and rhythm ABD protuberant, soft Neurologically intact MSK Neurovascularly intact Sensation intact distally Feet warm Dorsiflexion/Plantar flexion intact Incision: dressing C/D/I   Prudencio Burly III, PA-C 07/20/2016, 7:11 AM

## 2016-07-20 NOTE — Discharge Summary (Signed)
Discharge Summary  Patient ID: Wendy [name] MRN: [id number] DOB/AGE: 07/28/1950 66 y.o.  Admit date: 07/19/2016 Discharge date: 07/20/2016  Admission Diagnoses:  Primary osteoarthritis of right hip  Discharge Diagnoses:  Principal Problem:   Primary osteoarthritis of right hip Active Problems:   CAD (coronary artery disease)   Hypertension   HLD (hyperlipidemia)   PAD (peripheral artery disease) (HCC)   COPD (chronic obstructive pulmonary disease) (HCC)   Chronic back pain   Sleep apnea   Anxiety   GERD (gastroesophageal reflux disease)   PAF (paroxysmal atrial fibrillation) (HCC)   PVD (peripheral vascular disease) (HCC)   Arteriosclerosis of coronary artery   Essential (primary) hypertension   Avascular necrosis of hip, right (HCC)   Past Medical History:  Diagnosis Date  . Anemia   . Anginal pain (HCC)    jaw pain  . Anxiety   . Arthritis   . CAD (coronary artery disease), native coronary artery    a. Hx CABG 1998, last PCI 2013 b.LHC 07/2013:  EF 55-60%, L-LAD prob atretic, no sig disease in LAD, S-OM1 ok with patent stent, S-dRCA ok => med Rx. c. LHC 01/2015 DES to SVG-RCA.  Marland Kitchen Chronic back pain   . Chronic leg pain   . COPD (chronic obstructive pulmonary disease) ([place])   . GERD (gastroesophageal reflux disease)   . Glucose intolerance (impaired glucose tolerance)   . HLD (hyperlipidemia)   . Hypertension   . Myocardial infarction ([place])   . PAD (peripheral artery disease) ([place])   . PAF (paroxysmal atrial fibrillation) ([place])    a. 09/2013 post-op b. 01/2015  . Peripheral neuropathy   . Shortness of breath    when walking  . Sleep apnea    uses a cpap  . Wears glasses     Surgeries: Procedure(s): TOTAL HIP ARTHROPLASTY ANTERIOR APPROACH on 07/19/2016   Consultants (if any):   Discharged Condition: Improved  Hospital Course: Wendy [name] is an 66 y.o. female who was admitted 07/19/2016 with a diagnosis of Primary osteoarthritis of right hip and  went to the operating room on 07/19/2016 and underwent the above named procedures.    She was given perioperative antibiotics:  Anti-infectives    Start     Dose/Rate Route Frequency Ordered Stop   07/19/16 1730  ceFAZolin (ANCEF) IVPB 2g/100 mL premix     2 g 200 mL/hr over 30 Minutes Intravenous Every 6 hours 07/19/16 1640 07/19/16 2302   07/19/16 0818  ceFAZolin (ANCEF) 2-4 GM/100ML-% IVPB    Comments:  Wendy [name]   : cabinet override      07/19/16 0818 07/19/16 1135   07/19/16 0807  ceFAZolin (ANCEF) IVPB 2g/100 mL premix     2 g 200 mL/hr over 30 Minutes Intravenous On call to O.R. 07/19/16 7867 07/19/16 1150    .  She was given sequential compression devices, early ambulation, and home dose Eliquis for DVT prophylaxis.  She will resume home Plavix dt PCI history as well.  Discussed with her cardiologist.  She benefited maximally from the hospital stay and there were no complications.    Recent vital signs:  Vitals:   07/20/16 0020 07/20/16 0414  BP: (!) 130/53 (!) 100/50  Pulse: (!) 54 (!) 54  Resp: 18 18  Temp: 97.5 F (36.4 C) 97.5 F (36.4 C)    Recent laboratory studies:  Lab Results  Component Value Date   HGB 11.4 (L) 07/08/2016   HGB 9.6 (L) 08/13/2015   HGB 10.0 (  L) 01/23/2015   Lab Results  Component Value Date   WBC 5.4 07/08/2016   PLT 159 07/08/2016   Lab Results  Component Value Date   INR 1.11 07/19/2016   Lab Results  Component Value Date   NA 141 07/08/2016   K 4.4 07/08/2016   CL 105 07/08/2016   CO2 24 07/08/2016   BUN 13 07/08/2016   CREATININE 0.86 07/08/2016   GLUCOSE 97 07/08/2016    Discharge Medications:   Allergies as of 07/20/2016      Reactions   Benadryl [diphenhydramine] Shortness Of Breath   Adhesive [tape]    Burning of skin   Amoxicillin Diarrhea   Has patient had a PCN reaction causing immediate rash, facial/tongue/throat swelling, SOB or lightheadedness with hypotension: No Has patient had a PCN reaction  causing severe rash involving mucus membranes or skin necrosis: No Has patient had a PCN reaction that required hospitalization No Has patient had a PCN reaction occurring within the last 10 years: Yes If all of the above answers are "NO", then may proceed with Cephalosporin use.   Flexeril [cyclobenzaprine] Other (See Comments)   Causes restless leg syndrome   Hydrocodone Itching      Medication List    TAKE these medications   acetaminophen 500 MG tablet Commonly known as:  TYLENOL Take 1,000 mg by mouth every 8 (eight) hours as needed for mild pain or moderate pain.   albuterol 108 (90 Base) MCG/ACT inhaler Commonly known as:  PROVENTIL HFA;VENTOLIN HFA Inhale 2 puffs into the lungs every 6 (six) hours as needed for wheezing or shortness of breath.   ALPRAZolam 0.5 MG tablet Commonly known as:  XANAX Take 0.5 mg by mouth 3 (three) times daily as needed for anxiety.   apixaban 5 MG Tabs tablet Commonly known as:  ELIQUIS Take 1 tablet (5 mg total) by mouth 2 (two) times daily.   atorvastatin 80 MG tablet Commonly known as:  LIPITOR TAKE 1 TABLET (80 MG TOTAL) BY MOUTH DAILY AT 6 PM.   baclofen 20 MG tablet Commonly known as:  LIORESAL Take 20 mg by mouth 3 (three) times daily as needed for muscle spasms.   cetirizine 10 MG tablet Commonly known as:  ZYRTEC Take 10 mg by mouth daily.   cholecalciferol 1000 units tablet Commonly known as:  VITAMIN D Take 2,000 Units by mouth daily.   clopidogrel 75 MG tablet Commonly known as:  PLAVIX Take 75 mg by mouth daily.   diclofenac sodium 1 % Gel Commonly known as:  VOLTAREN Apply 1 g topically daily as needed (pain).   DSS 100 MG Caps Take 100 mg by mouth 2 (two) times daily.   ezetimibe 10 MG tablet Commonly known as:  ZETIA Take 10 mg by mouth daily.   ferrous sulfate 325 (65 FE) MG tablet Take 1 tablet (325 mg total) by mouth 2 (two) times daily.   FISH OIL + D3 PO Take 1 capsule by mouth daily. 1200 mg fish  oil 600 mg Omega 3 2000 IU Vitamin D   isosorbide mononitrate 120 MG 24 hr tablet Commonly known as:  IMDUR Take 1 tablet (120 mg total) by mouth daily. What changed:  when to take this   metoprolol tartrate 50 MG tablet Commonly known as:  LOPRESSOR Take 1 tablet (50 mg total) by mouth 2 (two) times daily.   NASAL SPRAY NA Place 1 spray into the nose daily as needed (congestion).   nitroGLYCERIN 0.4 MG SL  tablet Commonly known as:  NITROSTAT Place 0.4 mg under the tongue every 5 (five) minutes as needed for chest pain.   omeprazole 20 MG capsule Commonly known as:  PRILOSEC Take 40 mg by mouth daily.   ondansetron 4 MG tablet Commonly known as:  ZOFRAN Take 1 tablet (4 mg total) by mouth every 8 (eight) hours as needed for nausea or vomiting.   oxyCODONE-acetaminophen 5-325 MG tablet Commonly known as:  ROXICET Take 1-2 tablets by mouth every 4 (four) hours as needed for severe pain.   pramipexole 0.25 MG tablet Commonly known as:  MIRAPEX Take 0.25 mg by mouth at bedtime as needed.   promethazine 12.5 MG tablet Commonly known as:  PHENERGAN Take 1-2 tablets (12.5-25 mg total) by mouth every 6 (six) hours as needed for nausea.   RANEXA 1000 MG SR tablet Generic drug:  ranolazine Take 1,000 mg by mouth 2 (two) times daily.   sertraline 100 MG tablet Commonly known as:  ZOLOFT Take 150 mg by mouth daily. Takes 1.5 tablets daily       Diagnostic Studies: Dg C-arm 1-60 Min  Result Date: 07/19/2016 CLINICAL DATA:  Right hip replacement. EXAM: DG C-ARM 61-120 MIN; OPERATIVE RIGHT HIP WITH PELVIS COMPARISON:  MRI 12/30/2015 . FINDINGS: Total right hip replacement.  Hardware intact.  Anatomic alignment. IMPRESSION: Total right hip replacement with anatomic alignment. Electronically Signed   By: Marcello Moores  Register   On: 07/19/2016 13:04   Dg Hip Operative Unilat W Or W/o Pelvis Right  Result Date: 07/19/2016 CLINICAL DATA:  Right hip replacement. EXAM: DG C-ARM 61-120  MIN; OPERATIVE RIGHT HIP WITH PELVIS COMPARISON:  MRI 12/30/2015 . FINDINGS: Total right hip replacement.  Hardware intact.  Anatomic alignment. IMPRESSION: Total right hip replacement with anatomic alignment. Electronically Signed   By: Marcello Moores  Register   On: 07/19/2016 13:04    Disposition: 01-Home or Self Care    Follow-up Information    [name], MD Follow up.   Specialty:  Orthopedic Surgery Contact information: [place] [place] [id number] [phone]            Signed: Prudencio Burly III PA-C 07/20/2016, 7:17 AM

## 2016-07-20 NOTE — Progress Notes (Signed)
Patient is refusing the use of CPAP for tonight stating she doesn't wear one. RT informed patient if she changes her mind have RN contact RT.

## 2016-07-20 NOTE — Evaluation (Addendum)
Physical Therapy Evaluation Patient Details Name: Wendy [name] MRN: [id number] DOB: 14-Dec-1950 Today's Date: 07/20/2016   History of Present Illness  Wendy [name] is a 66 y.o. female former smoker with a history of  MI s/p CABG 1998 and PCI 2013 on Plavix and Eliquis, HLD, OSA - CPAP with intermittent compliance, PAF, B/L proximal LE vascular surgery, Chronic back pain, and COPD who presents for direct anterior R THA  Clinical Impression  Pt is s/p THA resulting in the deficits listed below (see PT Problem List). Pt ambulated 300' with RW and supervision. Reviewed activity level upon d/c as well as car transfer.  Pt will benefit from skilled PT to increase their independence and safety with mobility to allow discharge to the venue listed below.      Follow Up Recommendations DC plan and follow up therapy as arranged by surgeon    Equipment Recommendations  None recommended by PT    Recommendations for Other Services       Precautions / Restrictions Precautions Precautions: None Restrictions Weight Bearing Restrictions: Yes RLE Weight Bearing: Weight bearing as tolerated      Mobility  Bed Mobility Overal bed mobility: Needs Assistance Bed Mobility: Supine to Sit     Supine to sit: Supervision     General bed mobility comments: vc's for sequencing  Transfers Overall transfer level: Needs assistance Equipment used: Rolling walker (2 wheeled) Transfers: Sit to/from Stand Sit to Stand: Min guard         General transfer comment: vc's for hand placement, min-guard for safety. Pt tends to try to mobilize without use of RW. Encouraged use of RW for safety as well as symmetrical gait  Ambulation/Gait Ambulation/Gait assistance: Supervision Ambulation Distance (Feet): 300 Feet Assistive device: Rolling walker (2 wheeled) Gait Pattern/deviations: Step-through pattern;Antalgic;Decreased weight shift to right Gait velocity: decreased Gait velocity interpretation:  Below normal speed for age/gender General Gait Details: pt very steady with RW with smooth gait  Stairs            Wheelchair Mobility    Modified Rankin (Stroke Patients Only)       Balance Overall balance assessment: No apparent balance deficits (not formally assessed)                                           Pertinent Vitals/Pain Pain Assessment: Faces Faces Pain Scale: Hurts whole lot Pain Location: Right LE Pain Descriptors / Indicators: Shooting;Aching Pain Intervention(s): Limited activity within patient's tolerance;Monitored during session;Premedicated before session    Home Living Family/patient expects to be discharged to:: Private residence Living Arrangements: Other relatives (aunt) Available Help at Discharge: Family;Available 24 hours/day;Friend(s) Type of Home: Mobile home Home Access: Ramped entrance     Home Layout: One level Home Equipment: Clinical cytogeneticist - 2 wheels      Prior Function Level of Independence: Independent               Hand Dominance        Extremity/Trunk Assessment   Upper Extremity Assessment Upper Extremity Assessment: Overall WFL for tasks assessed    Lower Extremity Assessment Lower Extremity Assessment: RLE deficits/detail RLE Deficits / Details: hip flex 3-/5, ankle and knee WFL    Cervical / Trunk Assessment Cervical / Trunk Assessment: Normal  Communication   Communication: No difficulties  Cognition Arousal/Alertness: Awake/alert Behavior During Therapy: WFL for tasks assessed/performed  Overall Cognitive Status: Within Functional Limits for tasks assessed                                        General Comments      Exercises Total Joint Exercises Ankle Circles/Pumps: AROM;Both;10 reps;Supine Quad Sets: AROM;10 reps;Supine;Both Gluteal Sets: AROM;10 reps;Supine Heel Slides: AROM;Right;10 reps;Supine Hip ABduction/ADduction: AROM;Right;10  reps;Supine Straight Leg Raises: AAROM;Right;5 reps;Supine Long Arc Quad: AROM;Right;15 reps;Seated Marching in Standing: AROM;Both;5 reps;Standing Standing Hip Extension: AROM;Right;10 reps;Standing   Assessment/Plan    PT Assessment Patient needs continued PT services  PT Problem List Decreased strength;Decreased range of motion;Decreased activity tolerance;Decreased mobility;Decreased knowledge of use of DME;Decreased knowledge of precautions;Pain       PT Treatment Interventions DME instruction;Gait training;Functional mobility training;Therapeutic activities;Therapeutic exercise;Patient/family education    PT Goals (Current goals can be found in the Care Plan section)  Acute Rehab PT Goals Patient Stated Goal: return home PT Goal Formulation: With patient Time For Goal Achievement: 07/27/16 Potential to Achieve Goals: Good    Frequency 7X/week   Barriers to discharge        Co-evaluation               AM-PAC PT "6 Clicks" Daily Activity  Outcome Measure Difficulty turning over in bed (including adjusting bedclothes, sheets and blankets)?: A Little Difficulty moving from lying on back to sitting on the side of the bed? : A Little Difficulty sitting down on and standing up from a chair with arms (e.g., wheelchair, bedside commode, etc,.)?: A Little Help needed moving to and from a bed to chair (including a wheelchair)?: A Little Help needed walking in hospital room?: A Little Help needed climbing 3-5 steps with a railing? : A Little 6 Click Score: 18    End of Session Equipment Utilized During Treatment: Gait belt Activity Tolerance: Patient tolerated treatment well Patient left: in bed;with call bell/phone within reach Nurse Communication: Mobility status PT Visit Diagnosis: Pain;Muscle weakness (generalized) (M62.81);Other abnormalities of gait and mobility (R26.89) Pain - Right/Left: Right Pain - part of body: Hip    Time: 9678-9381 PT Time Calculation  (min) (ACUTE ONLY): 22 min   Charges:   PT Evaluation $PT Eval Low Complexity: 1 Procedure     PT G Codes:        Leighton Roach, PT  Acute Rehab Services  [place] 07/20/2016, 11:56 AM

## 2016-07-20 NOTE — Evaluation (Signed)
Occupational Therapy Evaluation Patient Details Name: Wendy [name] MRN: [id number] DOB: 05/31/1950 Today's Date: 07/20/2016    History of Present Illness Wendy [name] is a 66 y.o. female former smoker with a history of  MI s/p CABG 1998 and PCI 2013 on Plavix and Eliquis, HLD, OSA - CPAP with intermittent compliance, PAF, B/L proximal LE vascular surgery, Chronic back pain, and COPD who presents for direct anterior R THA   Clinical Impression   Pt is at min guard A level with ADL mobility and transfers with min verbal cues for safety with use of RW. All education completed and no further acute OT indicated at this time    Follow Up Recommendations  No OT follow up;Supervision - Intermittent    Equipment Recommendations  None recommended by OT;Tub/shower bench    Recommendations for Other Services       Precautions / Restrictions Precautions Precautions: None Restrictions Weight Bearing Restrictions: Yes RLE Weight Bearing: Weight bearing as tolerated      Mobility Bed Mobility Overal bed mobility: Needs Assistance Bed Mobility: Sit to Supine     Supine to sit: Supervision     General bed mobility comments: vc's for sequencing  Transfers Overall transfer level: Needs assistance Equipment used: Rolling walker (2 wheeled) Transfers: Sit to/from Stand Sit to Stand: Min guard         General transfer comment: vc's for correct hand placement    Balance Overall balance assessment: No apparent balance deficits (not formally assessed)                                         ADL either performed or assessed with clinical judgement   ADL Overall ADL's : Needs assistance/impaired     Grooming: Wash/dry hands;Wash/dry face;Standing;Supervision/safety   Upper Body Bathing: Set up   Lower Body Bathing: Min guard;Sit to/from stand   Upper Body Dressing : Set up   Lower Body Dressing: Sit to/from stand;Min guard   Toilet Transfer:  Ambulation;RW;Cueing for safety;Min guard   Toileting- Clothing Manipulation and Hygiene: Min guard;Sit to/from stand       Functional mobility during ADLs: Min guard       Vision Baseline Vision/History: No visual deficits Patient Visual Report: No change from baseline                  Pertinent Vitals/Pain Pain Assessment: 0-10 Pain Score: 7  Faces Pain Scale: Hurts whole lot Pain Location: Right LE Pain Descriptors / Indicators: Shooting;Aching Pain Intervention(s): Limited activity within patient's tolerance;Monitored during session;Premedicated before session     Hand Dominance Right   Extremity/Trunk Assessment Upper Extremity Assessment Upper Extremity Assessment: Overall WFL for tasks assessed   Lower Extremity Assessment Lower Extremity Assessment: Defer to PT evaluation RLE Deficits / Details: hip flex 3-/5, ankle and knee WFL   Cervical / Trunk Assessment Cervical / Trunk Assessment: Normal   Communication Communication Communication: No difficulties   Cognition Arousal/Alertness: Awake/alert Behavior During Therapy: WFL for tasks assessed/performed Overall Cognitive Status: Within Functional Limits for tasks assessed                                     General Comments   pt pleasant and cooperative              Home  Living Family/patient expects to be discharged to:: Private residence Living Arrangements: Other relatives Available Help at Discharge: Family;Available 24 hours/day;Friend(s) Type of Home: Mobile home Home Access: Ramped entrance     Home Layout: One level     Bathroom Shower/Tub: Teacher, early years/pre: Standard     Home Equipment: Clinical cytogeneticist - 2 wheels          Prior Functioning/Environment Level of Independence: Independent                 OT Problem List: Pain;Decreased activity tolerance      OT Treatment/Interventions:      OT Goals(Current goals can be found  in the care plan section) Acute Rehab OT Goals Patient Stated Goal: return home  OT Frequency:     Barriers to D/C:    no barriers                     AM-PAC PT "6 Clicks" Daily Activity     Outcome Measure Help from another person eating meals?: None Help from another person taking care of personal grooming?: None Help from another person toileting, which includes using toliet, bedpan, or urinal?: A Little Help from another person bathing (including washing, rinsing, drying)?: A Little Help from another person to put on and taking off regular upper body clothing?: None Help from another person to put on and taking off regular lower body clothing?: A Little 6 Click Score: 21   End of Session Equipment Utilized During Treatment: Rolling walker  Activity Tolerance: Patient tolerated treatment well Patient left: in chair;with call bell/phone within reach  OT Visit Diagnosis: Pain Pain - Right/Left: Right Pain - part of body: Hip;Leg                Time: 0867-6195 OT Time Calculation (min): 18 min Charges:  OT General Charges $OT Visit: 1 Procedure OT Evaluation $OT Eval Low Complexity: 1 Procedure G-Codes: OT G-codes **NOT FOR INPATIENT CLASS** Functional Assessment Tool Used: AM-PAC 6 Clicks Daily Activity     Britt Bottom 07/20/2016, 12:23 PM

## 2016-07-20 NOTE — Discharge Instructions (Signed)
Information on my medicine - ELIQUIS (apixaban)  Why was Eliquis prescribed for you? Eliquis was prescribed for you to reduce the risk of a blood clot forming that can cause a stroke if you have a medical condition called atrial fibrillation (a type of irregular heartbeat).  What do You need to know about Eliquis ? Take your Eliquis TWICE DAILY - one tablet in the morning and one tablet in the evening with or without food. If you have difficulty swallowing the tablet whole please discuss with your pharmacist how to take the medication safely.  Take Eliquis exactly as prescribed by your doctor and DO NOT stop taking Eliquis without talking to the doctor who prescribed the medication.  Stopping may increase your risk of developing a stroke.  Refill your prescription before you run out.  After discharge, you should have regular check-up appointments with your healthcare provider that is prescribing your Eliquis.  In the future your dose may need to be changed if your kidney function or weight changes by a significant amount or as you get older.  What do you do if you miss a dose? If you miss a dose, take it as soon as you remember on the same day and resume taking twice daily.  Do not take more than one dose of ELIQUIS at the same time to make up a missed dose.  Important Safety Information A possible side effect of Eliquis is bleeding. You should call your healthcare provider right away if you experience any of the following: ? Bleeding from an injury or your nose that does not stop. ? Unusual colored urine (red or dark brown) or unusual colored stools (red or black). ? Unusual bruising for unknown reasons. ? A serious fall or if you hit your head (even if there is no bleeding).  Some medicines may interact with Eliquis and might increase your risk of bleeding or clotting while on Eliquis. To help avoid this, consult your healthcare provider or pharmacist prior to using any new  prescription or non-prescription medications, including herbals, vitamins, non-steroidal anti-inflammatory drugs (NSAIDs) and supplements.  This website has more information on Eliquis (apixaban): http://www.eliquis.com/eliquis/home  INSTRUCTIONS AFTER JOINT REPLACEMENT   o Remove items at home which could result in a fall. This includes throw rugs or furniture in walking pathways o ICE to the affected joint every three hours while awake for 30 minutes at a time, for at least the first 3-5 days, and then as needed for pain and swelling.  Continue to use ice for pain and swelling. You may notice swelling that will progress down to the foot and ankle.  This is normal after surgery.  Elevate your leg when you are not up walking on it.   o Continue to use the breathing machine you got in the hospital (incentive spirometer) which will help keep your temperature down.  It is common for your temperature to cycle up and down following surgery, especially at night when you are not up moving around and exerting yourself.  The breathing machine keeps your lungs expanded and your temperature down.   DIET:  As you were doing prior to hospitalization, we recommend a well-balanced diet.  DRESSING / WOUND CARE / SHOWERING  Keep the surgical dressing until follow up.  IF THE DRESSING FALLS OFF or the wound gets wet inside, change the dressing with sterile gauze.  Please use good hand washing techniques before changing the dressing.  Do not use any lotions or creams on the  incision until instructed by your surgeon.    ACTIVITY  o Increase activity slowly as tolerated, but follow the weight bearing instructions below.   o No driving for 6 weeks or until further direction given by your physician.  You cannot drive while taking narcotics.  o No lifting or carrying greater than 10 lbs. until further directed by your surgeon. o Avoid periods of inactivity such as sitting longer than an hour when not asleep. This  helps prevent blood clots.  o You may return to work once you are authorized by your doctor.     WEIGHT BEARING   Weight bearing as tolerated with assist device (walker, cane, etc) as directed, use it as long as suggested by your surgeon or therapist, typically at least 4-6 weeks.   EXERCISES  Results after joint replacement surgery are often greatly improved when you follow the exercise, range of motion and muscle strengthening exercises prescribed by your doctor. Safety measures are also important to protect the joint from further injury. Any time any of these exercises cause you to have increased pain or swelling, decrease what you are doing until you are comfortable again and then slowly increase them. If you have problems or questions, call your caregiver or physical therapist for advice.   Rehabilitation is important following a joint replacement. After just a few days of immobilization, the muscles of the leg can become weakened and shrink (atrophy).  These exercises are designed to build up the tone and strength of the thigh and leg muscles and to improve motion. Often times heat used for twenty to thirty minutes before working out will loosen up your tissues and help with improving the range of motion but do not use heat for the first two weeks following surgery (sometimes heat can increase post-operative swelling).   These exercises can be done on a training (exercise) mat, on the floor, on a table or on a bed. Use whatever works the best and is most comfortable for you.    Use music or television while you are exercising so that the exercises are a pleasant break in your day. This will make your life better with the exercises acting as a break in your routine that you can look forward to.   Perform all exercises about fifteen times, three times per day or as directed.  You should exercise both the operative leg and the other leg as well.  Exercises include:    Quad Sets - Tighten up  the muscle on the front of the thigh (Quad) and hold for 5-10 seconds.    Straight Leg Raises - With your knee straight (if you were given a brace, keep it on), lift the leg to 60 degrees, hold for 3 seconds, and slowly lower the leg.  Perform this exercise against resistance later as your leg gets stronger.   Leg Slides: Lying on your back, slowly slide your foot toward your buttocks, bending your knee up off the floor (only go as far as is comfortable). Then slowly slide your foot back down until your leg is flat on the floor again.   Angel Wings: Lying on your back spread your legs to the side as far apart as you can without causing discomfort.   Hamstring Strength:  Lying on your back, push your heel against the floor with your leg straight by tightening up the muscles of your buttocks.  Repeat, but this time bend your knee to a comfortable angle, and push your heel  against the floor.  You may put a pillow under the heel to make it more comfortable if necessary.   A rehabilitation program following joint replacement surgery can speed recovery and prevent re-injury in the future due to weakened muscles. Contact your doctor or a physical therapist for more information on knee rehabilitation.    CONSTIPATION  Constipation is defined medically as fewer than three stools per week and severe constipation as less than one stool per week.  Even if you have a regular bowel pattern at home, your normal regimen is likely to be disrupted due to multiple reasons following surgery.  Combination of anesthesia, postoperative narcotics, change in appetite and fluid intake all can affect your bowels.   YOU MUST use at least one of the following options; they are listed in order of increasing strength to get the job done.  They are all available over the counter, and you may need to use some, POSSIBLY even all of these options:    Drink plenty of fluids (prune juice may be helpful) and high fiber foods Colace  100 mg by mouth twice a day  Senokot for constipation as directed and as needed Dulcolax (bisacodyl), take with full glass of water  Miralax (polyethylene glycol) once or twice a day as needed.  If you have tried all these things and are unable to have a bowel movement in the first 3-4 days after surgery call either your surgeon or your primary doctor.    If you experience loose stools or diarrhea, hold the medications until you stool forms back up.  If your symptoms do not get better within 1 week or if they get worse, check with your doctor.  If you experience "the worst abdominal pain ever" or develop nausea or vomiting, please contact the office immediately for further recommendations for treatment.   ITCHING:  If you experience itching with your medications, try taking only a single pain pill, or even half a pain pill at a time.  You can also use Benadryl over the counter for itching or also to help with sleep.   TED HOSE STOCKINGS:  Use stockings on both legs until for at least 2 weeks or as directed by physician office. They may be removed at night for sleeping.  MEDICATIONS:  See your medication summary on the After Visit Summary that nursing will review with you.  You may have some home medications which will be placed on hold until you complete the course of blood thinner medication.  It is important for you to complete the blood thinner medication as prescribed.  PRECAUTIONS:  If you experience chest pain or shortness of breath - call 911 immediately for transfer to the hospital emergency department.   If you develop a fever greater that 101 F, purulent drainage from wound, increased redness or drainage from wound, foul odor from the wound/dressing, or calf pain - CONTACT YOUR SURGEON.                                                   FOLLOW-UP APPOINTMENTS:  If you do not already have a post-op appointment, please call the office for an appointment to be seen by your surgeon.   Guidelines for how soon to be seen are listed in your After Visit Summary, but are typically between 1-4 weeks after surgery.  OTHER INSTRUCTIONS:   Knee Replacement:  Do not place pillow under knee, focus on keeping the knee straight while resting. CPM instructions: 0-90 degrees, 2 hours in the morning, 2 hours in the afternoon, and 2 hours in the evening. Place foam block, curve side up under heel at all times except when in CPM or when walking.  DO NOT modify, tear, cut, or change the foam block in any way.  MAKE SURE YOU:   Understand these instructions.   Get help right away if you are not doing well or get worse.    Thank you for letting us be a part of your medical care team.  It is a privilege we respect greatly.  We hope these instructions will help you stay on track for a fast and full recovery!

## 2016-07-22 ENCOUNTER — Emergency Department (HOSPITAL_COMMUNITY): Payer: Medicare HMO

## 2016-07-22 ENCOUNTER — Inpatient Hospital Stay (HOSPITAL_COMMUNITY)
Admission: EM | Admit: 2016-07-22 | Discharge: 2016-07-26 | DRG: 286 | Disposition: A | Discharge status: Home / Self Care | Payer: Medicare HMO | Attending: Cardiovascular Disease | Admitting: Cardiovascular Disease

## 2016-07-22 ENCOUNTER — Encounter (HOSPITAL_COMMUNITY): Payer: Self-pay

## 2016-07-22 DIAGNOSIS — Z885 Allergy status to narcotic agent status: Secondary | ICD-10-CM | POA: Diagnosis not present

## 2016-07-22 DIAGNOSIS — I480 Paroxysmal atrial fibrillation: Secondary | ICD-10-CM

## 2016-07-22 DIAGNOSIS — I21A9 Other myocardial infarction type: Secondary | ICD-10-CM | POA: Diagnosis not present

## 2016-07-22 DIAGNOSIS — Z6834 Body mass index (BMI) 34.0-34.9, adult: Secondary | ICD-10-CM

## 2016-07-22 DIAGNOSIS — R748 Abnormal levels of other serum enzymes: Secondary | ICD-10-CM | POA: Diagnosis not present

## 2016-07-22 DIAGNOSIS — E782 Mixed hyperlipidemia: Secondary | ICD-10-CM | POA: Diagnosis present

## 2016-07-22 DIAGNOSIS — I5041 Acute combined systolic (congestive) and diastolic (congestive) heart failure: Secondary | ICD-10-CM | POA: Diagnosis present

## 2016-07-22 DIAGNOSIS — E785 Hyperlipidemia, unspecified: Secondary | ICD-10-CM | POA: Diagnosis present

## 2016-07-22 DIAGNOSIS — Z8249 Family history of ischemic heart disease and other diseases of the circulatory system: Secondary | ICD-10-CM

## 2016-07-22 DIAGNOSIS — Z801 Family history of malignant neoplasm of trachea, bronchus and lung: Secondary | ICD-10-CM | POA: Diagnosis not present

## 2016-07-22 DIAGNOSIS — I25110 Atherosclerotic heart disease of native coronary artery with unstable angina pectoris: Secondary | ICD-10-CM | POA: Diagnosis not present

## 2016-07-22 DIAGNOSIS — T82218A Other mechanical complication of coronary artery bypass graft, initial encounter: Secondary | ICD-10-CM | POA: Diagnosis present

## 2016-07-22 DIAGNOSIS — E668 Other obesity: Secondary | ICD-10-CM | POA: Diagnosis present

## 2016-07-22 DIAGNOSIS — Z880 Allergy status to penicillin: Secondary | ICD-10-CM

## 2016-07-22 DIAGNOSIS — G8929 Other chronic pain: Secondary | ICD-10-CM | POA: Diagnosis present

## 2016-07-22 DIAGNOSIS — I10 Essential (primary) hypertension: Secondary | ICD-10-CM | POA: Diagnosis not present

## 2016-07-22 DIAGNOSIS — Z832 Family history of diseases of the blood and blood-forming organs and certain disorders involving the immune mechanism: Secondary | ICD-10-CM | POA: Diagnosis not present

## 2016-07-22 DIAGNOSIS — I2510 Atherosclerotic heart disease of native coronary artery without angina pectoris: Secondary | ICD-10-CM | POA: Diagnosis not present

## 2016-07-22 DIAGNOSIS — I110 Hypertensive heart disease with heart failure: Secondary | ICD-10-CM | POA: Diagnosis not present

## 2016-07-22 DIAGNOSIS — Z91048 Other nonmedicinal substance allergy status: Secondary | ICD-10-CM

## 2016-07-22 DIAGNOSIS — I5043 Acute on chronic combined systolic (congestive) and diastolic (congestive) heart failure: Secondary | ICD-10-CM | POA: Diagnosis present

## 2016-07-22 DIAGNOSIS — Z955 Presence of coronary angioplasty implant and graft: Secondary | ICD-10-CM

## 2016-07-22 DIAGNOSIS — E7800 Pure hypercholesterolemia, unspecified: Secondary | ICD-10-CM | POA: Diagnosis not present

## 2016-07-22 DIAGNOSIS — I25810 Atherosclerosis of coronary artery bypass graft(s) without angina pectoris: Secondary | ICD-10-CM | POA: Diagnosis not present

## 2016-07-22 DIAGNOSIS — Z951 Presence of aortocoronary bypass graft: Secondary | ICD-10-CM

## 2016-07-22 DIAGNOSIS — Z888 Allergy status to other drugs, medicaments and biological substances status: Secondary | ICD-10-CM

## 2016-07-22 DIAGNOSIS — R778 Other specified abnormalities of plasma proteins: Secondary | ICD-10-CM | POA: Diagnosis present

## 2016-07-22 DIAGNOSIS — K219 Gastro-esophageal reflux disease without esophagitis: Secondary | ICD-10-CM | POA: Diagnosis present

## 2016-07-22 DIAGNOSIS — I5033 Acute on chronic diastolic (congestive) heart failure: Secondary | ICD-10-CM | POA: Diagnosis not present

## 2016-07-22 DIAGNOSIS — Z7901 Long term (current) use of anticoagulants: Secondary | ICD-10-CM | POA: Diagnosis not present

## 2016-07-22 DIAGNOSIS — R7989 Other specified abnormal findings of blood chemistry: Secondary | ICD-10-CM | POA: Diagnosis present

## 2016-07-22 DIAGNOSIS — R079 Chest pain, unspecified: Secondary | ICD-10-CM

## 2016-07-22 DIAGNOSIS — I214 Non-ST elevation (NSTEMI) myocardial infarction: Secondary | ICD-10-CM | POA: Diagnosis present

## 2016-07-22 DIAGNOSIS — M549 Dorsalgia, unspecified: Secondary | ICD-10-CM

## 2016-07-22 DIAGNOSIS — I252 Old myocardial infarction: Secondary | ICD-10-CM

## 2016-07-22 DIAGNOSIS — I25710 Atherosclerosis of autologous vein coronary artery bypass graft(s) with unstable angina pectoris: Secondary | ICD-10-CM | POA: Diagnosis present

## 2016-07-22 DIAGNOSIS — I200 Unstable angina: Secondary | ICD-10-CM | POA: Diagnosis not present

## 2016-07-22 DIAGNOSIS — Z96641 Presence of right artificial hip joint: Secondary | ICD-10-CM | POA: Diagnosis present

## 2016-07-22 DIAGNOSIS — M1611 Unilateral primary osteoarthritis, right hip: Secondary | ICD-10-CM | POA: Diagnosis present

## 2016-07-22 DIAGNOSIS — Z9861 Coronary angioplasty status: Secondary | ICD-10-CM | POA: Diagnosis not present

## 2016-07-22 DIAGNOSIS — I219 Acute myocardial infarction, unspecified: Secondary | ICD-10-CM | POA: Diagnosis present

## 2016-07-22 DIAGNOSIS — D509 Iron deficiency anemia, unspecified: Secondary | ICD-10-CM

## 2016-07-22 LAB — CBG MONITORING, ED
Glucose-Capillary: 95 mg/dL (ref 65–99)
Glucose-Capillary: 83 mg/dL (ref 65–99)

## 2016-07-22 LAB — CBC WITH DIFFERENTIAL/PLATELET
Basophils Absolute: 0 10*3/uL (ref 0.0–0.1)
Basophils Relative: 0 %
Eosinophils Absolute: 0 10*3/uL (ref 0.0–0.7)
Eosinophils Relative: 1 %
HCT: 31.8 % — ABNORMAL LOW (ref 36.0–46.0)
Hemoglobin: 10 g/dL — ABNORMAL LOW (ref 12.0–15.0)
Lymphocytes Relative: 15 %
Lymphs Abs: 0.9 10*3/uL (ref 0.7–4.0)
MCH: 28.7 pg (ref 26.0–34.0)
MCHC: 31.4 g/dL (ref 30.0–36.0)
MCV: 91.4 fL (ref 78.0–100.0)
Monocytes Absolute: 0.4 10*3/uL (ref 0.1–1.0)
Monocytes Relative: 7 %
Neutro Abs: 4.6 10*3/uL (ref 1.7–7.7)
Neutrophils Relative %: 77 %
Platelets: 160 10*3/uL (ref 150–400)
RBC: 3.48 MIL/uL — ABNORMAL LOW (ref 3.87–5.11)
RDW: 15.4 % (ref 11.5–15.5)
WBC: 5.9 10*3/uL (ref 4.0–10.5)

## 2016-07-22 LAB — BASIC METABOLIC PANEL
Anion gap: 9 (ref 5–15)
BUN: 13 mg/dL (ref 6–20)
CO2: 26 mmol/L (ref 22–32)
Calcium: 8.4 mg/dL — ABNORMAL LOW (ref 8.9–10.3)
Chloride: 106 mmol/L (ref 101–111)
Creatinine, Ser: 0.76 mg/dL (ref 0.44–1.00)
GFR calc Af Amer: >60 mL/min (ref >60)
GFR calc non Af Amer: >60 mL/min (ref >60)
Glucose, Bld: 90 mg/dL (ref 65–99)
Potassium: 3.7 mmol/L (ref 3.5–5.1)
Sodium: 141 mmol/L (ref 135–145)

## 2016-07-22 LAB — APTT: aPTT: 40 seconds — ABNORMAL HIGH (ref 24–36)

## 2016-07-22 LAB — I-STAT TROPONIN, ED
Troponin i, poc: 0.12 ng/mL (ref 0.00–0.08)
Troponin i, poc: 0.08 ng/mL (ref 0.00–0.08)

## 2016-07-22 LAB — BRAIN NATRIURETIC PEPTIDE: B Natriuretic Peptide: 954.4 pg/mL — ABNORMAL HIGH (ref 0.0–100.0)

## 2016-07-22 LAB — TROPONIN I: Troponin I: 0.12 ng/mL (ref <0.03)

## 2016-07-22 LAB — HEPARIN LEVEL (UNFRACTIONATED): Heparin Unfractionated: >2.2 IU/mL — ABNORMAL HIGH (ref 0.30–0.70)

## 2016-07-22 LAB — TSH: TSH: 1.357 u[IU]/mL (ref 0.350–4.500)

## 2016-07-22 MED ORDER — ASPIRIN 300 MG RE SUPP: 300.0000 mg | RECTAL | Status: AC

## 2016-07-22 MED ORDER — SODIUM CHLORIDE 0.9 % IV SOLN: 250.0000 mL | INTRAVENOUS | Status: DC | PRN

## 2016-07-22 MED ORDER — CLOPIDOGREL BISULFATE 75 MG PO TABS
75.0000 mg | ORAL_TABLET | Freq: Every day | ORAL | Status: DC
  Administered 2016-07-23 – 2016-07-26 (×4): 75 mg via ORAL
  Filled 2016-07-22 (×4): qty 1

## 2016-07-22 MED ORDER — PANTOPRAZOLE SODIUM 40 MG PO TBEC
40.0000 mg | DELAYED_RELEASE_TABLET | Freq: Every day | ORAL | Status: DC
  Administered 2016-07-23 – 2016-07-26 (×4): 40 mg via ORAL
  Filled 2016-07-22 (×4): qty 1

## 2016-07-22 MED ORDER — ALPRAZOLAM 0.5 MG PO TABS
0.5000 mg | ORAL_TABLET | Freq: Three times a day (TID) | ORAL | Status: DC | PRN
  Administered 2016-07-23 – 2016-07-26 (×6): 0.5 mg via ORAL
  Filled 2016-07-22 (×6): qty 1

## 2016-07-22 MED ORDER — PRAMIPEXOLE DIHYDROCHLORIDE 0.25 MG PO TABS
0.2500 mg | ORAL_TABLET | Freq: Every evening | ORAL | Status: DC | PRN
  Filled 2016-07-22: qty 1

## 2016-07-22 MED ORDER — OMEGA-3-ACID ETHYL ESTERS 1 G PO CAPS
1.0000 g | ORAL_CAPSULE | Freq: Every day | ORAL | Status: DC
  Administered 2016-07-23 – 2016-07-26 (×3): 1 g via ORAL
  Filled 2016-07-22 (×4): qty 1

## 2016-07-22 MED ORDER — LORATADINE 10 MG PO TABS: 10.0000 mg | ORAL_TABLET | Freq: Every day | ORAL | Status: DC | PRN

## 2016-07-22 MED ORDER — HEPARIN (PORCINE) IN NACL 100-0.45 UNIT/ML-% IJ SOLN
800.0000 [IU]/h | INTRAMUSCULAR | Status: DC
  Filled 2016-07-22: qty 250

## 2016-07-22 MED ORDER — NITROGLYCERIN 0.4 MG SL SUBL: 0.4000 mg | SUBLINGUAL_TABLET | SUBLINGUAL | Status: DC | PRN

## 2016-07-22 MED ORDER — ATORVASTATIN CALCIUM 80 MG PO TABS
80.0000 mg | ORAL_TABLET | Freq: Every day | ORAL | Status: DC
  Administered 2016-07-23 – 2016-07-25 (×3): 80 mg via ORAL
  Filled 2016-07-22 (×3): qty 1

## 2016-07-22 MED ORDER — ASPIRIN 81 MG PO CHEW: 324.0000 mg | CHEWABLE_TABLET | ORAL | Status: AC

## 2016-07-22 MED ORDER — RANOLAZINE ER 500 MG PO TB12
1000.0000 mg | ORAL_TABLET | Freq: Two times a day (BID) | ORAL | Status: DC
  Administered 2016-07-22 – 2016-07-26 (×8): 1000 mg via ORAL
  Filled 2016-07-22 (×8): qty 2

## 2016-07-22 MED ORDER — OXYCODONE-ACETAMINOPHEN 5-325 MG PO TABS
1.0000 | ORAL_TABLET | ORAL | Status: DC | PRN
  Administered 2016-07-23: 2 via ORAL
  Administered 2016-07-23 (×3): 1 via ORAL
  Administered 2016-07-23: 2 via ORAL
  Administered 2016-07-25 – 2016-07-26 (×3): 1 via ORAL
  Filled 2016-07-22 (×3): qty 1
  Filled 2016-07-22: qty 2
  Filled 2016-07-22: qty 1
  Filled 2016-07-22: qty 2
  Filled 2016-07-22: qty 1

## 2016-07-22 MED ORDER — FERROUS SULFATE 325 (65 FE) MG PO TABS
325.0000 mg | ORAL_TABLET | Freq: Two times a day (BID) | ORAL | Status: DC
  Administered 2016-07-23 – 2016-07-26 (×6): 325 mg via ORAL
  Filled 2016-07-22 (×7): qty 1

## 2016-07-22 MED ORDER — HEPARIN BOLUS VIA INFUSION
3000.0000 [IU] | Freq: Once | INTRAVENOUS | Status: DC
  Filled 2016-07-22: qty 3000

## 2016-07-22 MED ORDER — FISH OIL + D3 1200-1000 MG-UNIT PO CAPS: ORAL_CAPSULE | Freq: Every day | ORAL | Status: DC

## 2016-07-22 MED ORDER — NITROGLYCERIN 2 % TD OINT
0.5000 [in_us] | TOPICAL_OINTMENT | Freq: Four times a day (QID) | TRANSDERMAL | Status: DC
  Administered 2016-07-22 – 2016-07-26 (×7): 0.5 [in_us] via TOPICAL
  Filled 2016-07-22 (×2): qty 30

## 2016-07-22 MED ORDER — ALBUTEROL SULFATE (2.5 MG/3ML) 0.083% IN NEBU: 3.0000 mL | INHALATION_SOLUTION | Freq: Four times a day (QID) | RESPIRATORY_TRACT | Status: DC | PRN

## 2016-07-22 MED ORDER — HEPARIN (PORCINE) IN NACL 100-0.45 UNIT/ML-% IJ SOLN
1150.0000 [IU]/h | INTRAMUSCULAR | Status: DC
  Administered 2016-07-22: 800 [IU]/h via INTRAVENOUS
  Administered 2016-07-23 – 2016-07-24 (×2): 1150 [IU]/h via INTRAVENOUS
  Filled 2016-07-22 (×3): qty 250

## 2016-07-22 MED ORDER — ONDANSETRON HCL 4 MG/2ML IJ SOLN: 4.0000 mg | Freq: Four times a day (QID) | INTRAMUSCULAR | Status: DC | PRN

## 2016-07-22 MED ORDER — ACETAMINOPHEN 325 MG PO TABS
650.0000 mg | ORAL_TABLET | ORAL | Status: DC | PRN
Start: 2016-07-22 — End: 2016-07-25

## 2016-07-22 MED ORDER — ASPIRIN EC 81 MG PO TBEC
81.0000 mg | DELAYED_RELEASE_TABLET | Freq: Every day | ORAL | Status: DC
  Administered 2016-07-23 – 2016-07-24 (×2): 81 mg via ORAL
  Filled 2016-07-22 (×2): qty 1

## 2016-07-22 MED ORDER — EZETIMIBE 10 MG PO TABS
10.0000 mg | ORAL_TABLET | Freq: Every day | ORAL | Status: DC
  Administered 2016-07-23 – 2016-07-26 (×4): 10 mg via ORAL
  Filled 2016-07-22 (×4): qty 1

## 2016-07-22 MED ORDER — MORPHINE SULFATE (PF) 4 MG/ML IV SOLN
4.0000 mg | Freq: Once | INTRAVENOUS | Status: AC
  Administered 2016-07-22: 4 mg via INTRAVENOUS
  Filled 2016-07-22: qty 1

## 2016-07-22 MED ORDER — BACLOFEN 20 MG PO TABS
20.0000 mg | ORAL_TABLET | Freq: Three times a day (TID) | ORAL | Status: DC | PRN
  Administered 2016-07-24 – 2016-07-25 (×3): 20 mg via ORAL
  Filled 2016-07-22 (×3): qty 1

## 2016-07-22 MED ORDER — SERTRALINE HCL 50 MG PO TABS
150.0000 mg | ORAL_TABLET | Freq: Every day | ORAL | Status: DC
  Administered 2016-07-23 – 2016-07-26 (×4): 150 mg via ORAL
  Filled 2016-07-22 (×4): qty 1

## 2016-07-22 MED ORDER — LORAZEPAM 2 MG/ML IJ SOLN
0.5000 mg | Freq: Once | INTRAMUSCULAR | Status: AC
  Administered 2016-07-22: 0.5 mg via INTRAVENOUS
  Filled 2016-07-22: qty 1

## 2016-07-22 MED ORDER — METOPROLOL TARTRATE 25 MG PO TABS
25.0000 mg | ORAL_TABLET | Freq: Two times a day (BID) | ORAL | Status: DC
  Administered 2016-07-23: 25 mg via ORAL
  Filled 2016-07-22 (×2): qty 1

## 2016-07-22 NOTE — ED Notes (Signed)
Verified heparin with [facility] RN

## 2016-07-22 NOTE — ED Notes (Signed)
Attempted report secretary states that room has not been assigned yet.

## 2016-07-22 NOTE — ED Notes (Signed)
Attempted report 

## 2016-07-22 NOTE — ED Provider Notes (Signed)
[facility] DEPT Provider Note   CSN: [id number] Arrival date & time: 07/22/16  1529     History   Chief Complaint Chief Complaint  Patient presents with  . Chest Pain    HPI Wendy [name] is a 66 y.o. female with a past medical history of CAD with cabg in 1998 and last cardiac cath in 2016 with stent placement and medical management including eliquis and plavix presenting with increasing episodes of angina.  She describes intermittent episodes of angina at baseline with radiation into her bilateral jaws which she treats with nitroglycerin (last home dose taken last night). She endorses increasing episodes of angina including at rest over the past several weeks, although underwent a complete right hip replacement just 3 days ago here.  She was seen by her pcp this am at which time she was having an angina attack. She was given nitroglycerin x 1 and her pain resolved.  She is currently pain free.   She denies lower extremity pain or swelling.   Chest Pain   Pertinent negatives include no abdominal pain, no dizziness, no fever, no headaches, no nausea, no numbness, no palpitations, no shortness of breath, no vomiting and no weakness.    Past Medical History:  Diagnosis Date  . Anemia   . Anginal pain (HCC)    jaw pain  . Anxiety   . Arthritis   . CAD (coronary artery disease), native coronary artery    a. Hx CABG 1998, last PCI 2013 b.LHC 07/2013:  EF 55-60%, L-LAD prob atretic, no sig disease in LAD, S-OM1 ok with patent stent, S-dRCA ok => med Rx. c. LHC 01/2015 DES to SVG-RCA.  Marland Kitchen Chronic back pain   . Chronic leg pain   . COPD (chronic obstructive pulmonary disease) ([place])   . GERD (gastroesophageal reflux disease)   . Glucose intolerance (impaired glucose tolerance)   . HLD (hyperlipidemia)   . Hypertension   . Myocardial infarction ([place])   . PAD (peripheral artery disease) ([place])   . PAF (paroxysmal atrial fibrillation) ([place])    a. 09/2013 post-op b. 01/2015  .  Peripheral neuropathy   . Shortness of breath    when walking  . Sleep apnea    uses a cpap  . Wears glasses     Patient Active Problem List   Diagnosis Date Noted  . Primary osteoarthritis of right hip 06/27/2016  . Avascular necrosis of hip, right ([place]) 06/27/2016  . PVD (peripheral vascular disease) ([place])   . PAF (paroxysmal atrial fibrillation) ([place]) 01/21/2015  . Essential (primary) hypertension 01/14/2015  . Arteriosclerosis of coronary artery 10/06/2014  . Acute angina ([place]) 05/12/2014  . Epigastric pain   . Chest pain 10/21/2013  . Wound infection after surgery 10/17/2013  . Acute respiratory failure ([place]) 09/20/2013  . Nonspecific abnormal results of liver function study 09/17/2013  . Cholecystitis, acute 09/15/2013  . PAD (peripheral artery disease) ([place])   . COPD (chronic obstructive pulmonary disease) ([place])   . Peripheral neuropathy   . Chronic back pain   . Sleep apnea   . Anxiety   . GERD (gastroesophageal reflux disease)   . 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 Hypertension 07/20/2013  . HLD (hyperlipidemia) 07/20/2013  . Right groin wound 07/20/2013  . Unstable angina ([place]) 07/19/2013  . CAD (coronary artery disease) 07/19/2013    Past Surgical History:  Procedure Laterality Date  . CARDIAC CATHETERIZATION N/A 01/22/2015   Procedure: Left Heart Cath and Coronary Angiography;  Surgeon: Joyice Faster  Claiborne Billings, MD;  Location: [facility];  Service: Cardiovascular;  Laterality: N/A;  . CARDIAC CATHETERIZATION N/A 01/22/2015   Procedure: Coronary Stent Intervention;  Surgeon: Troy Sine, MD;  Location: [facility];  Service: Cardiovascular;  Laterality: N/A;  3.0x12 xience prox SVG to RCA  . CARDIAC SURGERY  1998   LIMA-LAD, SVG-OM, SVG-RCA  . [place] and again several years later  . CHOLECYSTECTOMY  09/17/2013   dr [name]  . CHOLECYSTECTOMY N/A 09/16/2013   Procedure: LAPAROSCOPIC CHOLECYSTECTOMY CONVERTED TO OPEN  CHOLECYSTECTOMY WITH CHOLANGIOGRAM;  Surgeon: [name], MD;  Location: [facility];  Service: General;  Laterality: N/A;  . CORONARY ANGIOPLASTY WITH STENT PLACEMENT  11/2011, 02/2012, 01/2015   DES to SVG-RCA both times, In [place], [place], Dr. Bruce Donath  . ERCP N/A 09/19/2013   Procedure: ENDOSCOPIC RETROGRADE CHOLANGIOPANCREATOGRAPHY (ERCP);  Surgeon: [name], MD;  Location: [facility];  Service: Endoscopy;  Laterality: N/A;  . FINGER SURGERY Right    ring finger  . i and d right subcostal wound  10/17/13  . IRRIGATION AND DEBRIDEMENT ABSCESS Right 10/17/2013   Procedure: INCISION AND DRAINAGE RIGHT SUBCOSTAL WOUND;  Surgeon: [name], MD;  Location: [facility];  Service: General;  Laterality: Right;  . KNEE ARTHROSCOPY Right 1998  . LEFT HEART CATHETERIZATION WITH CORONARY/GRAFT ANGIOGRAM N/A 07/19/2013   Procedure: LEFT HEART CATHETERIZATION WITH Beatrix Fetters;  Surgeon: Leonie Man, MD;  Location: [facility] C[facility] LAB;  Service: Cardiovascular;  Laterality: N/A;  . MASS EXCISION Right 06/25/2014   Procedure: EXCISION BUTTOCK MASS ;  Surgeon: Coralie Keens, MD;  Location: [facility];  Service: General;  Laterality: Right;  . ORIF HUMERUS FRACTURE Left 08/27/2013   Procedure: OPEN REDUCTION INTERNAL FIXATION (ORIF) LEFT HUMERUS ;  Surgeon: Rozanna Box, MD;  Location: [facility];  Service: Orthopedics;  Laterality: Left;  . TOTAL HIP ARTHROPLASTY Right 07/19/2016   Procedure: TOTAL HIP ARTHROPLASTY ANTERIOR APPROACH;  Surgeon: [name], MD;  Location: [facility];  Service: Orthopedics;  Laterality: Right;    OB History    No data available       Home Medications    Prior to Admission medications   Medication Sig Start Date End Date Taking? Authorizing Provider  acetaminophen (TYLENOL) 500 MG tablet Take 1,000 mg by mouth every 8 (eight) hours as needed for mild pain or moderate pain.    [provider]  albuterol (PROVENTIL HFA;VENTOLIN  HFA) 108 (90 BASE) MCG/ACT inhaler Inhale 2 puffs into the lungs every 6 (six) hours as needed for wheezing or shortness of breath.    [provider]  ALPRAZolam Duanne Moron) 0.5 MG tablet Take 0.5 mg by mouth 3 (three) times daily as needed for anxiety.    [provider]  apixaban (ELIQUIS) 5 MG TABS tablet Take 1 tablet (5 mg total) by mouth 2 (two) times daily. 01/07/16   Nahser, Wonda Cheng, MD  atorvastatin (LIPITOR) 80 MG tablet TAKE 1 TABLET (80 MG TOTAL) BY MOUTH DAILY AT 6 PM. 10/05/15   Strader, [place] M, PA-C  baclofen (LIORESAL) 20 MG tablet Take 20 mg by mouth 3 (three) times daily as needed for muscle spasms.    [provider]  cetirizine (ZYRTEC) 10 MG tablet Take 10 mg by mouth daily.     [provider]  cholecalciferol (VITAMIN D) 1000 units tablet Take 2,000 Units by mouth daily.    [provider]  clopidogrel (  PLAVIX) 75 MG tablet Take 75 mg by mouth daily.    [provider]  diclofenac sodium (VOLTAREN) 1 % GEL Apply 1 g topically daily as needed (pain).    [provider]  docusate sodium 100 MG CAPS Take 100 mg by mouth 2 (two) times daily. Patient not taking: Reported on 07/05/2016 08/28/13   Ainsley Spinner, PA-C  ezetimibe (ZETIA) 10 MG tablet Take 10 mg by mouth daily.    [provider]  ferrous sulfate 325 (65 FE) MG tablet Take 1 tablet (325 mg total) by mouth 2 (two) times daily. Patient not taking: Reported on 07/05/2016 10/22/15   Irene Shipper, MD  Fish Oil-Cholecalciferol (FISH OIL + D3 PO) Take 1 capsule by mouth daily. 1200 mg fish oil 600 mg Omega 3 2000 IU Vitamin D    [provider]  isosorbide mononitrate (IMDUR) 120 MG 24 hr tablet Take 1 tablet (120 mg total) by mouth daily. Patient taking differently: Take 120 mg by mouth 2 (two) times daily.  01/23/15   Strader, Fransisco Hertz, PA-C  metoprolol (LOPRESSOR) 50 MG tablet Take 1 tablet (50 mg total) by mouth 2 (two) times daily. 06/24/16    Nahser, Wonda Cheng, MD  nitroGLYCERIN (NITROSTAT) 0.4 MG SL tablet Place 0.4 mg under the tongue every 5 (five) minutes as needed for chest pain.    [provider]  omeprazole (PRILOSEC) 20 MG capsule Take 40 mg by mouth daily.    [provider]  ondansetron (ZOFRAN) 4 MG tablet Take 1 tablet (4 mg total) by mouth every 8 (eight) hours as needed for nausea or vomiting. 07/19/16   Prudencio Burly III, PA-C  oxyCODONE-acetaminophen (ROXICET) 5-325 MG tablet Take 1-2 tablets by mouth every 4 (four) hours as needed for severe pain. 07/19/16   Prudencio Burly III, PA-C  Oxymetazoline HCl (NASAL SPRAY NA) Place 1 spray into the nose daily as needed (congestion).    [provider]  pramipexole (MIRAPEX) 0.25 MG tablet Take 0.25 mg by mouth at bedtime as needed.    [provider]  promethazine (PHENERGAN) 12.5 MG tablet Take 1-2 tablets (12.5-25 mg total) by mouth every 6 (six) hours as needed for nausea. Patient not taking: Reported on 07/05/2016 11/04/13   Coralie Keens, MD  ranolazine (RANEXA) 1000 MG SR tablet Take 1,000 mg by mouth 2 (two) times daily.    [provider]  sertraline (ZOLOFT) 100 MG tablet Take 150 mg by mouth daily. Takes 1.5 tablets daily    [provider]    Family History Family History  Problem Relation Age of Onset  . Lung cancer Mother   . Clotting disorder Father   . Lung cancer Sister   . Heart disease Brother   . Heart disease Brother   . Colon cancer Neg Hx     Social History Social History  Substance Use Topics  . Smoking status: Former Smoker    Quit date: 07/20/1998  . Smokeless tobacco: Never Used  . Alcohol use No     Allergies   Benadryl [diphenhydramine]; Adhesive [tape]; Amoxicillin; Flexeril [cyclobenzaprine]; and Hydrocodone   Review of Systems Review of Systems  Constitutional: Negative for chills and fever.  HENT: Negative for congestion and sore throat.   Eyes:  Negative.   Respiratory: Negative for chest tightness and shortness of breath.   Cardiovascular: Positive for chest pain. Negative for palpitations and leg swelling.  Gastrointestinal: Negative for abdominal pain, nausea and vomiting.  Genitourinary: Negative.   Musculoskeletal: Negative for arthralgias, joint swelling and neck pain.  Skin: Negative.  Negative for rash and wound.  Neurological: Negative for dizziness, weakness, light-headedness, numbness and headaches.  Psychiatric/Behavioral: Negative.      Physical Exam Updated Vital Signs BP (!) 113/47   Pulse (!) 53   Temp 98.4 F (36.9 C) (Oral)   Resp 13   Ht 5\' 1"  (1.549 m)   Wt 175 lb (79.4 kg)   SpO2 99%   BMI 33.07 kg/m   Physical Exam  Constitutional: She appears well-developed and well-nourished.  HENT:  Head: Normocephalic and atraumatic.  Eyes: Conjunctivae are normal.  Neck: Normal range of motion.  Cardiovascular: Normal rate, regular rhythm, normal heart sounds and intact distal pulses.   Pulses:      Radial pulses are 2+ on the right side, and 2+ on the left side.       Dorsalis pedis pulses are 2+ on the right side, and 2+ on the left side.  Pulmonary/Chest: Effort normal and breath sounds normal. She has no wheezes.  Abdominal: Soft. Bowel sounds are normal. There is no tenderness.  Musculoskeletal: Normal range of motion. She exhibits edema.  Trace bilateral ankle edema.  No leg pain, no calf ttp or edema.  Neurological: She is alert.  Skin: Skin is warm and dry.  Psychiatric: She has a normal mood and affect.  Nursing note and vitals reviewed.    ED Treatments / Results  Labs (all labs ordered are listed, but only abnormal results are displayed) Labs Reviewed  CBC WITH DIFFERENTIAL/PLATELET - Abnormal; Notable for the following:       Result Value   RBC 3.48 (*)    Hemoglobin 10.0 (*)    HCT 31.8 (*)    All other components within normal limits  I-STAT TROPOININ, ED - Abnormal; Notable for  the following:    Troponin i, poc 0.12 (*)    All other components within normal limits  BASIC METABOLIC PANEL  BRAIN NATRIURETIC PEPTIDE  CBG MONITORING, ED    EKG  EKG Interpretation  Date/Time:  Friday Jul 22 2016 15:34:55 EDT Ventricular Rate:  52 PR Interval:    QRS Duration: 95 QT Interval:  455 QTC Calculation: 424 R Axis:   64 Text Interpretation:  Sinus rhythm Borderline repolarization abnormality No STEMI.  Confirmed by Nanda Quinton [phone]) on 07/22/2016 4:36:36 PM       Radiology Dg Chest Portable 1 View  Result Date: 07/22/2016 CLINICAL DATA:  Chest pain EXAM: PORTABLE CHEST 1 VIEW COMPARISON:  01/21/2015 FINDINGS: Borderline cardiomegaly. The patient is status post CABG and coronary stenting. There is no edema, consolidation, effusion, or pneumothorax. No acute osseous finding. IMPRESSION: No acute finding. Electronically Signed   By: Monte Fantasia M.D.   On: 07/22/2016 16:09    Procedures Procedures (including critical care time)  Medications Ordered in ED Medications  morphine 4 MG/ML injection 4 mg (not administered)  LORazepam (ATIVAN) injection 0.5 mg (not administered)     Initial Impression / Assessment and Plan / ED Course  I have reviewed the triage vital signs and the nursing notes.  Pertinent labs & imaging results that were available during my care of the patient were reviewed by me and considered in my medical decision making (see chart for details).     Discussed patient with Dr. Loleta Chance who will see pt in ed and plan admission.  Pt aware of plan and results.  She remains sx free  at this time.  Final Clinical Impressions(s) / ED Diagnoses   Final diagnoses:  NSTEMI (non-ST elevated myocardial infarction) [facility])    New Prescriptions New Prescriptions   No medications on file     Landis Martins 07/22/16 1658    Evalee Jefferson, PA-C 07/22/16 1700    Long, Wonda Olds, MD 07/22/16 [phone]

## 2016-07-22 NOTE — ED Notes (Signed)
ED Provider at bedside. 

## 2016-07-22 NOTE — Progress Notes (Signed)
ANTICOAGULATION CONSULT NOTE - Initial Consult  Pharmacy Consult for heparin Indication: chest pain/ACS  Allergies  Allergen Reactions  . Benadryl [Diphenhydramine] Shortness Of Breath  . Adhesive [Tape]     Burning of skin  . Amoxicillin Diarrhea    Has patient had a PCN reaction causing immediate rash, facial/tongue/throat swelling, SOB or lightheadedness with hypotension: No Has patient had a PCN reaction causing severe rash involving mucus membranes or skin necrosis: No Has patient had a PCN reaction that required hospitalization No Has patient had a PCN reaction occurring within the last 10 years: Yes If all of the above answers are "NO", then may proceed with Cephalosporin use.   Yvette Rack [Cyclobenzaprine] Other (See Comments)    Causes restless leg syndrome  . Hydrocodone Itching    Patient Measurements: Height: 5\' 1"  (154.9 cm) Weight: 175 lb (79.4 kg) IBW/kg (Calculated) : 47.8 Heparin Dosing Weight: 65.5 kg  Vital Signs: Temp: 98.4 F (36.9 C) (05/18 1533) Temp Source: Oral (05/18 1533) BP: 99/46 (05/18 1806) Pulse Rate: 56 (05/18 1806)  Labs:  Recent Labs  07/20/16 0726 07/22/16 1611  HGB 10.4* 10.0*  HCT 33.4* 31.8*  PLT 178 160  CREATININE  --  0.76    Estimated Creatinine Clearance: 67.7 mL/min (by C-G formula based on SCr of 0.76 mg/dL).   Medical History: Past Medical History:  Diagnosis Date  . Anemia   . Anginal pain (HCC)    jaw pain  . Anxiety   . Arthritis   . CAD (coronary artery disease), native coronary artery    a. Hx CABG 1998, last PCI 2013 b.LHC 07/2013:  EF 55-60%, L-LAD prob atretic, no sig disease in LAD, S-OM1 ok with patent stent, S-dRCA ok => med Rx. c. LHC 01/2015 DES to SVG-RCA.  Marland Kitchen Chronic back pain   . Chronic leg pain   . COPD (chronic obstructive pulmonary disease) ([place])   . GERD (gastroesophageal reflux disease)   . Glucose intolerance (impaired glucose tolerance)   . HLD (hyperlipidemia)   . Hypertension   .  Myocardial infarction ([place])   . PAD (peripheral artery disease) ([place])   . PAF (paroxysmal atrial fibrillation) ([place])    a. 09/2013 post-op b. 01/2015  . Peripheral neuropathy   . Shortness of breath    when walking  . Sleep apnea    uses a cpap  . Wears glasses     Assessment: 66yo F presents with ACS. Pharmacy consulted to dose heparin. On Eliquis PTA for afib. Last dose today 5/18 at 10am. Hgb 10, Plt wnl, no bleeding noted  Goal of Therapy:  Heparin level 0.3-0.7 units/ml aPTT 66-102 seconds Monitor platelets by anticoagulation protocol: Yes   Plan:  Obtain baseline heparin level, aPTT No bolus Start heparin 800 units/hr at 2200 Check heparin level, aPTT in 6 hours Daily heparin level, CBC   Gwenlyn Perking, PharmD PGY1 Pharmacy Resident Rx ED [phone] 07/22/2016 7:50 PM

## 2016-07-22 NOTE — H&P (Signed)
Patient ID: Wendy [name] MRN: [id number], DOB/AGE: 65/23/1952   Admit date: 07/22/2016   Primary Physician: Josetta Huddle, MD Primary Cardiologist: Dr [name]  HPI: 66 y/o female with a history of CAD, s/p CABG in '98 with an LIMA-LAD, SVG-OM, and SVG-RCA. Post op course complicated by post op MI secondary to native RCA occlusion. She then had a PCI to the [facility] Feb 2012, PCI again SVG-RCA 2013. Other problems include PAF, on Eliquis, HTN, HLD, obesity, and DJD.  She had a Myoview 07/15/16 as a pre op clearance for hip surgery. The Myoview was low to moderate risk with possible anterior ischemia. She underwent Rt THR 07/19/16 without problems.   Today she went to her PCP office for a routine appointment. There she relayed a history of intermittent SSCP "pressure" x 3 weeks. She had chest pain in his office relieved with SL NTG and was sent to the ED. In the ESD she described SSCP that radiated to her jaw. Her initial Troponin is elevated- 0.12. She is currently pain free.    Problem List: Past Medical History:  Diagnosis Date  . Anemia   . Anginal pain (HCC)    jaw pain  . Anxiety   . Arthritis   . CAD (coronary artery disease), native coronary artery    a. Hx CABG 1998, last PCI 2013 b.LHC 07/2013:  EF 55-60%, L-LAD prob atretic, no sig disease in LAD, S-OM1 ok with patent stent, S-dRCA ok => med Rx. c. LHC 01/2015 DES to SVG-RCA.  Marland Kitchen Chronic back pain   . Chronic leg pain   . COPD (chronic obstructive pulmonary disease) ([place])   . GERD (gastroesophageal reflux disease)   . Glucose intolerance (impaired glucose tolerance)   . HLD (hyperlipidemia)   . Hypertension   . Myocardial infarction ([place])   . PAD (peripheral artery disease) ([place])   . PAF (paroxysmal atrial fibrillation) ([place])    a. 09/2013 post-op b. 01/2015  . Peripheral neuropathy   . Shortness of breath    when walking  . Sleep apnea    uses a cpap  . Wears glasses     Past Surgical History:  Procedure  Laterality Date  . CARDIAC CATHETERIZATION N/A 01/22/2015   Procedure: Left Heart Cath and Coronary Angiography;  Surgeon: Troy Sine, MD;  Location: [place] [facility];  Service: Cardiovascular;  Laterality: N/A;  . CARDIAC CATHETERIZATION N/A 01/22/2015   Procedure: Coronary Stent Intervention;  Surgeon: Troy Sine, MD;  Location: [place] [facility];  Service: Cardiovascular;  Laterality: N/A;  3.0x12 xience prox SVG to RCA  . CARDIAC SURGERY  1998   LIMA-LAD, SVG-OM, SVG-RCA  . [place] and again several years later  . CHOLECYSTECTOMY  09/17/2013   dr [name]  . CHOLECYSTECTOMY N/A 09/16/2013   Procedure: LAPAROSCOPIC CHOLECYSTECTOMY CONVERTED TO OPEN CHOLECYSTECTOMY WITH CHOLANGIOGRAM;  Surgeon: [name], MD;  Location: [place];  Service: General;  Laterality: N/A;  . CORONARY ANGIOPLASTY WITH STENT PLACEMENT  11/2011, 02/2012, 01/2015   DES to SVG-RCA both times, In [place], [place], Dr. Bruce Donath  . ERCP N/A 09/19/2013   Procedure: ENDOSCOPIC RETROGRADE CHOLANGIOPANCREATOGRAPHY (ERCP);  Surgeon: [name], MD;  Location: [place];  Service: Endoscopy;  Laterality: N/A;  . FINGER SURGERY Right    ring finger  . i and d right subcostal wound  10/17/13  . IRRIGATION AND DEBRIDEMENT ABSCESS Right 10/17/2013   Procedure: INCISION AND DRAINAGE RIGHT SUBCOSTAL WOUND;  Surgeon: [name], MD;  Location: [facility];  Service: General;  Laterality: Right;  . KNEE ARTHROSCOPY Right 1998  . LEFT HEART CATHETERIZATION WITH CORONARY/GRAFT ANGIOGRAM N/A 07/19/2013   Procedure: LEFT HEART CATHETERIZATION WITH Beatrix Fetters;  Surgeon: Leonie Man, MD;  Location[facility] CATH LAB;  Service: Cardiovascular;  Laterality: N/A;  . MASS EXCISION Right 06/25/2014   Procedure: EXCISION BUTTOCK MASS ;  Surgeon: Coralie Keens, MD;  Location: [place];  Service: General;  Laterality: Right;  . ORIF HUMERUS FRACTURE Left 08/27/2013   Procedure:  OPEN REDUCTION INTERNAL FIXATION (ORIF) LEFT HUMERUS ;  Surgeon: Rozanna Box, MD;  Location: [place];  Service: Orthopedics;  Laterality: Left;  . TOTAL HIP ARTHROPLASTY Right 07/19/2016   Procedure: TOTAL HIP ARTHROPLASTY ANTERIOR APPROACH;  Surgeon: [name], MD;  Location: [place];  Service: Orthopedics;  Laterality: Right;     Allergies:  Allergies  Allergen Reactions  . Benadryl [Diphenhydramine] Shortness Of Breath  . Adhesive [Tape]     Burning of skin  . Amoxicillin Diarrhea    Has patient had a PCN reaction causing immediate rash, facial/tongue/throat swelling, SOB or lightheadedness with hypotension: No Has patient had a PCN reaction causing severe rash involving mucus membranes or skin necrosis: No Has patient had a PCN reaction that required hospitalization No Has patient had a PCN reaction occurring within the last 10 years: Yes If all of the above answers are "NO", then may proceed with Cephalosporin use.   Yvette Rack [Cyclobenzaprine] Other (See Comments)    Causes restless leg syndrome  . Hydrocodone Itching     Home Medications  Past Medical History:  Diagnosis Date  . Anemia   . Anginal pain (HCC)    jaw pain  . Anxiety   . Arthritis   . CAD (coronary artery disease), native coronary artery    a. Hx CABG 1998, last PCI 2013 b.LHC 07/2013:  EF 55-60%, L-LAD prob atretic, no sig disease in LAD, S-OM1 ok with patent stent, S-dRCA ok => med Rx. c. LHC 01/2015 DES to SVG-RCA.  Marland Kitchen Chronic back pain   . Chronic leg pain   . COPD (chronic obstructive pulmonary disease) ([place])   . GERD (gastroesophageal reflux disease)   . Glucose intolerance (impaired glucose tolerance)   . HLD (hyperlipidemia)   . Hypertension   . Myocardial infarction ([place])   . PAD (peripheral artery disease) ([place])   . PAF (paroxysmal atrial fibrillation) ([place])    a. 09/2013 post-op b. 01/2015  . Peripheral neuropathy   . Shortness of breath    when walking  . Sleep apnea    uses a  cpap  . Wears glasses     Prior to Admission medications   Medication Sig Start Date End Date Taking? Authorizing Provider  acetaminophen (TYLENOL) 500 MG tablet Take 1,000 mg by mouth every 8 (eight) hours as needed for mild pain or moderate pain.    [provider]  albuterol (PROVENTIL HFA;VENTOLIN HFA) 108 (90 BASE) MCG/ACT inhaler Inhale 2 puffs into the lungs every 6 (six) hours as needed for wheezing or shortness of breath.    [provider]  ALPRAZolam Duanne Moron) 0.5 MG tablet Take 0.5 mg by mouth 3 (three) times daily as needed for anxiety.    [provider]  apixaban (ELIQUIS) 5 MG TABS tablet Take 1 tablet (5 mg total) by mouth 2 (two) times daily. 01/07/16   Nahser, Wonda Cheng, MD  atorvastatin (LIPITOR)  80 MG tablet TAKE 1 TABLET (80 MG TOTAL) BY MOUTH DAILY AT 6 PM. 10/05/15   Strader, [place] M, PA-C  baclofen (LIORESAL) 20 MG tablet Take 20 mg by mouth 3 (three) times daily as needed for muscle spasms.    [provider]  cetirizine (ZYRTEC) 10 MG tablet Take 10 mg by mouth daily.     [provider]  cholecalciferol (VITAMIN D) 1000 units tablet Take 2,000 Units by mouth daily.    [provider]  clopidogrel (PLAVIX) 75 MG tablet Take 75 mg by mouth daily.    [provider]  diclofenac sodium (VOLTAREN) 1 % GEL Apply 1 g topically daily as needed (pain).    [provider]  docusate sodium 100 MG CAPS Take 100 mg by mouth 2 (two) times daily. Patient not taking: Reported on 07/05/2016 08/28/13   Ainsley Spinner, PA-C  ezetimibe (ZETIA) 10 MG tablet Take 10 mg by mouth daily.    [provider]  ferrous sulfate 325 (65 FE) MG tablet Take 1 tablet (325 mg total) by mouth 2 (two) times daily. Patient not taking: Reported on 07/05/2016 10/22/15   Irene Shipper, MD  Fish Oil-Cholecalciferol (FISH OIL + D3 PO) Take 1 capsule by mouth daily. 1200 mg fish oil 600 mg Omega 3 2000 IU Vitamin D    [provider]  isosorbide mononitrate (IMDUR) 120 MG 24 hr tablet Take 1 tablet (120 mg total) by mouth daily. Patient taking differently: Take 120 mg by mouth 2 (two) times daily.  01/23/15   Strader, Fransisco Hertz, PA-C  metoprolol (LOPRESSOR) 50 MG tablet Take 1 tablet (50 mg total) by mouth 2 (two) times daily. 06/24/16   Nahser, Wonda Cheng, MD  nitroGLYCERIN (NITROSTAT) 0.4 MG SL tablet Place 0.4 mg under the tongue every 5 (five) minutes as needed for chest pain.    [provider]  omeprazole (PRILOSEC) 20 MG capsule Take 40 mg by mouth daily.    [provider]  ondansetron (ZOFRAN) 4 MG tablet Take 1 tablet (4 mg total) by mouth every 8 (eight) hours as needed for nausea or vomiting. 07/19/16   Prudencio Burly III, PA-C  oxyCODONE-acetaminophen (ROXICET) 5-325 MG tablet Take 1-2 tablets by mouth every 4 (four) hours as needed for severe pain. 07/19/16   Prudencio Burly III, PA-C  Oxymetazoline HCl (NASAL SPRAY NA) Place 1 spray into the nose daily as needed (congestion).    [provider]  pramipexole (MIRAPEX) 0.25 MG tablet Take 0.25 mg by mouth at bedtime as needed.    [provider]  promethazine (PHENERGAN) 12.5 MG tablet Take 1-2 tablets (12.5-25 mg total) by mouth every 6 (six) hours as needed for nausea. Patient not taking: Reported on 07/05/2016 11/04/13   Coralie Keens, MD  ranolazine (RANEXA) 1000 MG SR tablet Take 1,000 mg by mouth 2 (two) times daily.    [provider]  sertraline (ZOLOFT) 100 MG tablet Take 150 mg by mouth daily. Takes 1.5 tablets daily    [provider]     Family History  Problem Relation Age of Onset  . Lung cancer Mother   . Clotting disorder Father   . Lung cancer Sister   . Heart disease Brother   . Heart disease Brother   . Colon cancer Neg Hx      Social History   Social History  . Marital status: Divorced    Spouse name: N/A  . Number of  children: 5  . Years of education: N/A    Occupational History  . Disabled    Social History Main Topics  . Smoking status: Former Smoker    Quit date: 07/20/1998  . Smokeless tobacco: Never Used  . Alcohol use No  . Drug use: No  . Sexual activity: Not on file   Other Topics Concern  . Not on file   Social History Narrative   Lives with best friend.      Review of Systems: General: negative for chills, fever, night sweats or weight changes.  Cardiovascular: negative for chest pain, dyspnea on exertion, edema, orthopnea, palpitations, paroxysmal nocturnal dyspnea or shortness of breath HEENT: negative for any visual disturbances, blindness, glaucoma Dermatological: negative for rash Respiratory: negative for cough, hemoptysis, or wheezing Urologic: negative for hematuria or dysuria Abdominal: negative for nausea, vomiting, diarrhea, bright red blood per rectum, melena, or hematemesis Neurologic: negative for visual changes, syncope, or dizziness Musculoskeletal: negative for back pain, joint pain, or swelling Psych: cooperative and appropriate C/o hair loss and wanted to know if it was from medication All other systems reviewed and are otherwise negative except as noted above.  Physical Exam: Blood pressure (!) 128/50, pulse (!) 54, temperature 98.4 F (36.9 C), temperature source Oral, resp. rate 16, height 5\' 1"  (1.549 m), weight 175 lb (79.4 kg), SpO2 97 %.  General appearance: cooperative, moderately obese, slowed mentation and sedated with MSO4 and Ativan Neck: no carotid bruit and no JVD Lungs: clear to auscultation bilaterally Heart: regular rate and rhythm Abdomen: obese non tender Extremities: extremities normal, atraumatic, no cyanosis or edema Pulses: 2+ and symmetric Skin: cool ,pale, dry Neurologic: Grossly normal, lethargic after pain medication    Labs:   Results for orders placed or performed during the hospital encounter of 07/22/16 (from the past 24 hour(s))  Basic metabolic panel      Status: Abnormal   Collection Time: 07/22/16  4:11 PM  Result Value Ref Range   Sodium 141 135 - 145 mmol/L   Potassium 3.7 3.5 - 5.1 mmol/L   Chloride 106 101 - 111 mmol/L   CO2 26 22 - 32 mmol/L   Glucose, Bld 90 65 - 99 mg/dL   BUN 13 6 - 20 mg/dL   Creatinine, Ser 0.76 0.44 - 1.00 mg/dL   Calcium 8.4 (L) 8.9 - 10.3 mg/dL   GFR calc non Af Amer >60 >60 mL/min   GFR calc Af Amer >60 >60 mL/min   Anion gap 9 5 - 15  CBC with Differential     Status: Abnormal   Collection Time: 07/22/16  4:11 PM  Result Value Ref Range   WBC 5.9 4.0 - 10.5 K/uL   RBC 3.48 (L) 3.87 - 5.11 MIL/uL   Hemoglobin 10.0 (L) 12.0 - 15.0 g/dL   HCT 31.8 (L) 36.0 - 46.0 %   MCV 91.4 78.0 - 100.0 fL   MCH 28.7 26.0 - 34.0 pg   MCHC 31.4 30.0 - 36.0 g/dL   RDW 15.4 11.5 - 15.5 %   Platelets 160 150 - 400 K/uL   Neutrophils Relative % 77 %   Neutro Abs 4.6 1.7 - 7.7 K/uL   Lymphocytes Relative 15 %   Lymphs Abs 0.9 0.7 - 4.0 K/uL   Monocytes Relative 7 %   Monocytes Absolute 0.4 0.1 - 1.0 K/uL   Eosinophils Relative 1 %   Eosinophils Absolute 0.0 0.0 - 0.7 K/uL   Basophils Relative 0 %  Basophils Absolute 0.0 0.0 - 0.1 K/uL  I-stat troponin, ED (0, 3)     Status: Abnormal   Collection Time: 07/22/16  4:22 PM  Result Value Ref Range   Troponin i, poc 0.12 (HH) 0.00 - 0.08 ng/mL   Comment NOTIFIED PHYSICIAN    Comment 3          CBG monitoring, ED     Status: None   Collection Time: 07/22/16  4:29 PM  Result Value Ref Range   Glucose-Capillary 95 65 - 99 mg/dL     Radiology/Studies: Dg Chest Portable 1 View  Result Date: 07/22/2016 CLINICAL DATA:  Chest pain EXAM: PORTABLE CHEST 1 VIEW COMPARISON:  01/21/2015 FINDINGS: Borderline cardiomegaly. The patient is status post CABG and coronary stenting. There is no edema, consolidation, effusion, or pneumothorax. No acute osseous finding. IMPRESSION: No acute finding. Electronically Signed   By: Monte Fantasia M.D.   On: 07/22/2016 16:09   Dg C-arm  1-60 Min  Result Date: 07/19/2016 CLINICAL DATA:  Right hip replacement. EXAM: DG C-ARM 61-120 MIN; OPERATIVE RIGHT HIP WITH PELVIS COMPARISON:  MRI 12/30/2015 . FINDINGS: Total right hip replacement.  Hardware intact.  Anatomic alignment. IMPRESSION: Total right hip replacement with anatomic alignment. Electronically Signed   By: Marcello Moores  Register   On: 07/19/2016 13:04   Dg Hip Operative Unilat W Or W/o Pelvis Right  Result Date: 07/19/2016 CLINICAL DATA:  Right hip replacement. EXAM: DG C-ARM 61-120 MIN; OPERATIVE RIGHT HIP WITH PELVIS COMPARISON:  MRI 12/30/2015 . FINDINGS: Total right hip replacement.  Hardware intact.  Anatomic alignment. IMPRESSION: Total right hip replacement with anatomic alignment. Electronically Signed   By: Marcello Moores  Register   On: 07/19/2016 13:04    EKG:NSR, SB-52, inferior lateral TWI, old  ASSESSMENT AND PLAN:  Principal Problem:   Chest pain with high risk of acute coronary syndrome Active Problems:   Hx of CABG   HLD (hyperlipidemia)   Chronic back pain   GERD (gastroesophageal reflux disease)   PAF (paroxysmal atrial fibrillation) (HCC)   Essential (primary) hypertension   Primary osteoarthritis of right hip   Chronic anticoagulation   CAD S/P percutaneous coronary angioplasty   PLAN: Admit, hold Eliquis, start Heparin, add NTG. She is on the schedule for cath Monday with Dr [name]- orders written.    Angelena Form, PA-C 07/22/2016, 6:06 PM [phone]  I have seen and examined the patient along with Kerin Ransom, PA-C. I have reviewed the chart, notes and new data.  I agree with PA/NP's note.  Key new complaints: hard to get a detailed history after she received analgesics for her hip pain. Key examination changes: no arrhythmia and no overt hypervolemia/CHF Key new findings / data: mild and chronic inferolateral ST changes, mildly abnormal troponin, intermediate risk nuclear study with questionable abnormalities in 2 different  territories and mildly depressed LVEF.  PLAN: Probable unstable angina in a patient already taking stain, clopidogrel and 3 antianginal meds (beta blocker, nitrates, ranolazine), 3 days after orthopedic surgery. Pulmonary embolism considered, but she was quickly reanticoagulated after surgery, denies significant dyspnea and her symptoms promptly resolved with SL NTG - favor coronary etiology. Stop Eliquis and start heparin in anticipation of cath and possible revascularization in about 48-72 hours, after anticoagulant effect wears off. Continue clopidogrel.  Sanda Klein, MD, [place] [phone] 07/22/2016, 6:11 PM

## 2016-07-22 NOTE — ED Triage Notes (Signed)
Pt BIB [facility] from [facility] where pt was having a follow up appointment r/t recent right hip surgery. Pt reported at [facility] that she was having CP. Pt states she has been having CP for about 2 weeks and had a stress test done prior to her hip surgery. 324 Asprin and 1 nitro given PTA. Reported 12 lead T wave inversion. Pt reports taking Nitro at home regularly for CP.

## 2016-07-22 NOTE — ED Notes (Signed)
Patient lab results reported to Nurse Roselyn Reef.

## 2016-07-22 NOTE — ED Notes (Signed)
Pharmacy tech came out of room to notify this RN that pt was unable to arouse. This RN went into room and sternal rubbed pt with response. Pt now awake and speaking with pharmacy tech. MD made aware and requested repeat EKG and CBG. EKG given to MD and CBG 85. Pt denies pain currently.

## 2016-07-22 NOTE — ED Notes (Signed)
Pt denies CP currently.

## 2016-07-22 NOTE — ED Notes (Signed)
Chest XR at bedside 

## 2016-07-23 ENCOUNTER — Inpatient Hospital Stay (HOSPITAL_COMMUNITY): Payer: Medicare HMO

## 2016-07-23 DIAGNOSIS — E7800 Pure hypercholesterolemia, unspecified: Secondary | ICD-10-CM

## 2016-07-23 DIAGNOSIS — I10 Essential (primary) hypertension: Secondary | ICD-10-CM

## 2016-07-23 DIAGNOSIS — I480 Paroxysmal atrial fibrillation: Secondary | ICD-10-CM

## 2016-07-23 DIAGNOSIS — Z9861 Coronary angioplasty status: Secondary | ICD-10-CM

## 2016-07-23 DIAGNOSIS — I5043 Acute on chronic combined systolic (congestive) and diastolic (congestive) heart failure: Secondary | ICD-10-CM

## 2016-07-23 DIAGNOSIS — I2510 Atherosclerotic heart disease of native coronary artery without angina pectoris: Secondary | ICD-10-CM

## 2016-07-23 LAB — TROPONIN I
Troponin I: 0.08 ng/mL
Troponin I: 0.07 ng/mL

## 2016-07-23 LAB — CBC
HCT: 28.6 % — ABNORMAL LOW (ref 36.0–46.0)
Hemoglobin: 8.8 g/dL — ABNORMAL LOW (ref 12.0–15.0)
MCH: 28.3 pg (ref 26.0–34.0)
MCHC: 30.8 g/dL (ref 30.0–36.0)
MCV: 92 fL (ref 78.0–100.0)
Platelets: 138 K/uL — ABNORMAL LOW (ref 150–400)
RBC: 3.11 MIL/uL — ABNORMAL LOW (ref 3.87–5.11)
RDW: 15.4 % (ref 11.5–15.5)
WBC: 3.9 K/uL — ABNORMAL LOW (ref 4.0–10.5)

## 2016-07-23 LAB — PROTIME-INR
INR: 1.46
Prothrombin Time: 17.8 s — ABNORMAL HIGH (ref 11.4–15.2)

## 2016-07-23 LAB — APTT
aPTT: 48 s — ABNORMAL HIGH (ref 24–36)
aPTT: 41 s — ABNORMAL HIGH (ref 24–36)
aPTT: 67 seconds — ABNORMAL HIGH (ref 24–36)

## 2016-07-23 LAB — HIV ANTIBODY (ROUTINE TESTING W REFLEX): HIV Screen 4th Generation wRfx: NONREACTIVE

## 2016-07-23 LAB — ECHOCARDIOGRAM COMPLETE
Height: 61 in
Weight: 2806.4 [oz_av]

## 2016-07-23 MED ORDER — METOPROLOL TARTRATE 25 MG PO TABS
25.0000 mg | ORAL_TABLET | Freq: Once | ORAL | Status: AC
  Administered 2016-07-23: 25 mg via ORAL
  Filled 2016-07-23: qty 1

## 2016-07-23 MED ORDER — FUROSEMIDE 10 MG/ML IJ SOLN
40.0000 mg | Freq: Once | INTRAMUSCULAR | Status: AC
  Administered 2016-07-23: 40 mg via INTRAVENOUS
  Filled 2016-07-23: qty 4

## 2016-07-23 MED ORDER — METOPROLOL TARTRATE 50 MG PO TABS
50.0000 mg | ORAL_TABLET | Freq: Two times a day (BID) | ORAL | Status: DC
Start: 2016-07-23 — End: 2016-07-26
  Administered 2016-07-24 – 2016-07-26 (×4): 50 mg via ORAL
  Filled 2016-07-23 (×6): qty 1

## 2016-07-23 NOTE — Progress Notes (Signed)
CCMD notified RN that Pt was in Afib. EKG obtained. HR was ranging from 80s-120s. BP 107/60 Notified cardiology fellow on call. Orders to give morning dose of metoprolol early and monitor heart rate.

## 2016-07-23 NOTE — Progress Notes (Signed)
Progress Note  Patient Name: Wendy [name] Date of Encounter: 07/23/2016  Primary Cardiologist: Nahser  Subjective   Denies chest pain but describes shortness of breath with light activity. Slept sitting up so she is not sure whether she has orthopnea. New onset atrial fibrillation early this morning. Mild RVR. On heparin IV, Eliquis held in anticipation of heart catheterization on Monday morning. Cardiac troponin shows a mild, plateau elevation. BNP is markedly elevated compared to previous baseline. Chest x-ray did not show overt congestive heart failure.  Inpatient Medications    Scheduled Meds: . aspirin  324 mg Oral NOW   Or  . aspirin  300 mg Rectal NOW  . aspirin EC  81 mg Oral Daily  . atorvastatin  80 mg Oral q1800  . clopidogrel  75 mg Oral Daily  . ezetimibe  10 mg Oral Daily  . ferrous sulfate  325 mg Oral BID WC  . metoprolol tartrate  25 mg Oral BID  . nitroGLYCERIN  0.5 inch Topical Q6H  . omega-3 acid ethyl esters  1 g Oral Daily  . pantoprazole  40 mg Oral Daily  . ranolazine  1,000 mg Oral BID  . sertraline  150 mg Oral Daily   Continuous Infusions: . sodium chloride    . heparin 1,050 Units/hr (07/23/16 0652)   PRN Meds: sodium chloride, acetaminophen, albuterol, ALPRAZolam, baclofen, loratadine, nitroGLYCERIN, ondansetron (ZOFRAN) IV, oxyCODONE-acetaminophen, pramipexole   Vital Signs    Vitals:   07/23/16 0451 07/23/16 0600 07/23/16 0628 07/23/16 0725  BP: (!) 126/49 107/60  119/67  Pulse: (!) 59 99 (!) 118 (!) 55  Resp: 14 15  15   Temp: 97.4 F (36.3 C)   98.7 F (37.1 C)  TempSrc: Oral   Oral  SpO2: 98% 99%  99%  Weight: 175 lb 6.4 oz (79.6 kg)     Height:        Intake/Output Summary (Last 24 hours) at 07/23/16 0849 Last data filed at 07/23/16 0800  Gross per 24 hour  Intake             51.6 ml  Output             1300 ml  Net          -1248.4 ml   Filed Weights   07/22/16 1535 07/22/16 2030 07/23/16 0451  Weight: 175 lb  (79.4 kg) 176 lb 9.6 oz (80.1 kg) 175 lb 6.4 oz (79.6 kg)    Telemetry    Normal sinus rhythm with occasional PVCs until 6 AM when she developed atrial fibrillation with rapid ventricular response - Personally Reviewed  ECG    Atrial fibrillation rapid ventricular response, inferolateral ST depression and T-wave inversion, slightly more obvious than on previous ECG in sinus rhythm with slower rate - Personally Reviewed  Physical Exam  Sitting up in bed, appears comfortable GEN: No acute distress.   Neck: No JVD Cardiac:  irregular, no murmurs, rubs, or gallops.  Respiratory:  rales in both bases that only partially clear with cough. GI: Soft, nontender, non-distended  MS: No edema; No deformity. Neuro:  Nonfocal  Psych: Normal affect   Labs    Chemistry Recent Labs Lab 07/22/16 1611  NA 141  K 3.7  CL 106  CO2 26  GLUCOSE 90  BUN 13  CREATININE 0.76  CALCIUM 8.4*  GFRNONAA >60  GFRAA >60  ANIONGAP 9     Hematology Recent Labs Lab 07/20/16 0726 07/22/16 1611 07/23/16 0437  WBC 8.0 5.9 3.9*  RBC 3.66* 3.48* 3.11*  HGB 10.4* 10.0* 8.8*  HCT 33.4* 31.8* 28.6*  MCV 91.3 91.4 92.0  MCH 28.4 28.7 28.3  MCHC 31.1 31.4 30.8  RDW 14.7 15.4 15.4  PLT 178 160 138*    Cardiac Enzymes Recent Labs Lab 07/22/16 1914 07/23/16 0006 07/23/16 0437  TROPONINI 0.12* 0.08* 0.07*    Recent Labs Lab 07/22/16 1622 07/22/16 1949  TROPIPOC 0.12* 0.08     BNP Recent Labs Lab 07/22/16 1914  BNP 954.4*     DDimer No results for input(s): DDIMER in the last 168 hours.   Radiology    Dg Chest Portable 1 View  Result Date: 07/22/2016 CLINICAL DATA:  Chest pain EXAM: PORTABLE CHEST 1 VIEW COMPARISON:  01/21/2015 FINDINGS: Borderline cardiomegaly. The patient is status post CABG and coronary stenting. There is no edema, consolidation, effusion, or pneumothorax. No acute osseous finding. IMPRESSION: No acute finding. Electronically Signed   By: Monte Fantasia M.D.    On: 07/22/2016 16:09    Cardiac Studies   07/15/16 nuclear stress test Myocardial perfusion is abnormal. Small region of distal anterior ischemia, cannot exclude shifting soft tissue (breast). Inferior, inferolateral defect consistent with soft tissue attenuation and /or scar. This is an intermediate risk study. Overall left ventricular systolic function was abnormal. LV cavity size is normal. Nuclear stress EF: 48%.   Patient Profile     66 y.o. female with known coronary artery disease and previous bypass surgery and subsequent PCI, returns to the hospital 3 days after total hip replacement surgery with complaints of chest pain, borderline abnormal troponin, chronic inferolateral ST changes and markedly elevated BNP.   Assessment & Plan    1. Possible unstable angina: Currently pain-free. ECG changes are little more pronounced, likely related to increased heart rate with arrhythmia. Troponin elevation is not consistent with an acute coronary syndrome, but does portend a worse prognosis. Plan for coronary angiography on Monday (Dr [name], 1330h), after 48 hours off oral anticoagulants. This procedure has been fully reviewed with the patient and written informed consent has been obtained. 2. Acute on chronic heart failure, predominantly diastolic: Physical exam and BNP level support acute heart failure, although the chest x-ray did not show overt pulmonary congestion. We'll give a single dose of diuretic today. Avoid excessive diuresis before plan cardiac catheterization. Check echo to confirm the depressed LVEF reported on her nuclear stress test. Ideally we will wait until she returns to sinus rhythm to get more information on diastolic function and filling parameters. 3. Paroxysmal atrial fibrillation rapid ventricular response: We'll increase the beta blocker dose, but she may not tolerate a higher dose long-term since she had sinus bradycardia presentation. On intravenous heparin. Plan to  restart oral anticoagulants after cardiac catheterization. 4. HLP: On statin 5. Status post very recent right hip replacement.  Signed, Sanda Klein, MD  07/23/2016, 8:49 AM

## 2016-07-23 NOTE — Progress Notes (Signed)
ANTICOAGULATION CONSULT NOTE - Follow-up Consult  Pharmacy Consult for heparin Indication: chest pain/ACS  Allergies  Allergen Reactions  . Benadryl [Diphenhydramine] Shortness Of Breath  . Adhesive [Tape]     Burning of skin  . Amoxicillin Diarrhea    Has patient had a PCN reaction causing immediate rash, facial/tongue/throat swelling, SOB or lightheadedness with hypotension: No Has patient had a PCN reaction causing severe rash involving mucus membranes or skin necrosis: No Has patient had a PCN reaction that required hospitalization No Has patient had a PCN reaction occurring within the last 10 years: Yes If all of the above answers are "NO", then may proceed with Cephalosporin use.   Wendy [name] [Cyclobenzaprine] Other (See Comments)    Causes restless leg syndrome  . Hydrocodone Itching    Patient Measurements: Height: 5\' 1"  (154.9 cm) Weight: 175 lb 6.4 oz (79.6 kg) IBW/kg (Calculated) : 47.8 Heparin Dosing Weight: 65.5 kg  Vital Signs: Temp: 97.8 F (36.6 C) (05/19 2147) Temp Source: Oral (05/19 1607) BP: 84/46 (05/19 2147) Pulse Rate: 54 (05/19 2147)  Labs:  Recent Labs  07/22/16 1611  07/22/16 1914 07/23/16 0006 07/23/16 0437 07/23/16 1319 07/23/16 2105  HGB 10.0*  --   --   --  8.8*  --   --   HCT 31.8*  --   --   --  28.6*  --   --   PLT 160  --   --   --  138*  --   --   APTT  --   < > 40*  --  48* 41* 67*  LABPROT  --   --   --   --  17.8*  --   --   INR  --   --   --   --  1.46  --   --   HEPARINUNFRC  --   --  >2.20*  --   --   --   --   CREATININE 0.76  --   --   --   --   --   --   TROPONINI  --   --  0.12* 0.08* 0.07*  --   --   < > = values in this interval not displayed.  Estimated Creatinine Clearance: 67.9 mL/min (by C-G formula based on SCr of 0.76 mg/dL).   Assessment: 66yo F presents with ACS. Pharmacy consulted to dose heparin. On Eliquis PTA for afib. Last dose 5/18 at 10am. Utilizing PTT to monitor heparin for now until levels  correlating. Baseline heparin level > 2.2.  PTT 67 sec ,therapeutic on Heparin drip at 1150 units/hr. No bleeding reported   Goal of Therapy:  Heparin level 0.3-0.7 units/ml aPTT 66-102 seconds Monitor platelets by anticoagulation protocol: Yes   Plan:  Continue heparin to 1150 units/hr  Re-check APTT with AM labs  Daily heparin level/aPTT, CBC Monitor for signs/symptoms of bleeding   Wendy [name], RPh Clinical Pharmacist Pager: [phone] 07/23/2016 10:03 PM

## 2016-07-23 NOTE — Progress Notes (Signed)
ANTICOAGULATION CONSULT NOTE - Follow-up Consult  Pharmacy Consult for heparin Indication: chest pain/ACS  Allergies  Allergen Reactions  . Benadryl [Diphenhydramine] Shortness Of Breath  . Adhesive [Tape]     Burning of skin  . Amoxicillin Diarrhea    Has patient had a PCN reaction causing immediate rash, facial/tongue/throat swelling, SOB or lightheadedness with hypotension: No Has patient had a PCN reaction causing severe rash involving mucus membranes or skin necrosis: No Has patient had a PCN reaction that required hospitalization No Has patient had a PCN reaction occurring within the last 10 years: Yes If all of the above answers are "NO", then may proceed with Cephalosporin use.   Wendy [name] [Cyclobenzaprine] Other (See Comments)    Causes restless leg syndrome  . Hydrocodone Itching    Patient Measurements: Height: 5\' 1"  (154.9 cm) Weight: 175 lb 6.4 oz (79.6 kg) IBW/kg (Calculated) : 47.8 Heparin Dosing Weight: 65.5 kg  Vital Signs: Temp: 97.4 F (36.3 C) (05/19 0451) Temp Source: Oral (05/19 0451) BP: 107/60 (05/19 0600) Pulse Rate: 118 (05/19 0628)  Labs:  Recent Labs  07/20/16 0726 07/22/16 1611 07/22/16 1914 07/23/16 0006 07/23/16 0437  HGB 10.4* 10.0*  --   --  8.8*  HCT 33.4* 31.8*  --   --  28.6*  PLT 178 160  --   --  138*  APTT  --   --  40*  --  48*  LABPROT  --   --   --   --  17.8*  INR  --   --   --   --  1.46  HEPARINUNFRC  --   --  >2.20*  --   --   CREATININE  --  0.76  --   --   --   TROPONINI  --   --  0.12* 0.08* 0.07*    Estimated Creatinine Clearance: 67.9 mL/min (by C-G formula based on SCr of 0.76 mg/dL).   Assessment: 66yo F presents with ACS. Pharmacy consulted to dose heparin. On Eliquis PTA for afib. Last dose today 5/18 at 10am. Utilizing PTT to monitor heparin for now until levels correlating. Baseline heparin level > 2.2.  PTT 48 sec (subtherapeutic) on gtt at 800 units/hr. Hgb down to 8.8, plt down to 138. No issues  with line or bleeding reported per RN.  Goal of Therapy:  Heparin level 0.3-0.7 units/ml aPTT 66-102 seconds Monitor platelets by anticoagulation protocol: Yes   Plan:  Increase heparin to 1050 units/hr Will f/u 6 hr PTT  Sherlon Handing, PharmD, BCPS Clinical pharmacist, pager [phone] 07/23/2016 6:44 AM

## 2016-07-23 NOTE — Progress Notes (Signed)
ANTICOAGULATION CONSULT NOTE - Follow-up Consult  Pharmacy Consult for heparin Indication: chest pain/ACS  Allergies  Allergen Reactions  . Benadryl [Diphenhydramine] Shortness Of Breath  . Adhesive [Tape]     Burning of skin  . Amoxicillin Diarrhea    Has patient had a PCN reaction causing immediate rash, facial/tongue/throat swelling, SOB or lightheadedness with hypotension: No Has patient had a PCN reaction causing severe rash involving mucus membranes or skin necrosis: No Has patient had a PCN reaction that required hospitalization No Has patient had a PCN reaction occurring within the last 10 years: Yes If all of the above answers are "NO", then may proceed with Cephalosporin use.   Yvette Rack [Cyclobenzaprine] Other (See Comments)    Causes restless leg syndrome  . Hydrocodone Itching    Patient Measurements: Height: 5\' 1"  (154.9 cm) Weight: 175 lb 6.4 oz (79.6 kg) IBW/kg (Calculated) : 47.8 Heparin Dosing Weight: 65.5 kg  Vital Signs: Temp: 97.4 F (36.3 C) (05/19 1144) Temp Source: Oral (05/19 1144) BP: 103/68 (05/19 1144) Pulse Rate: 86 (05/19 1144)  Labs:  Recent Labs  07/22/16 1611 07/22/16 1914 07/23/16 0006 07/23/16 0437 07/23/16 1319  HGB 10.0*  --   --  8.8*  --   HCT 31.8*  --   --  28.6*  --   PLT 160  --   --  138*  --   APTT  --  40*  --  48* 41*  LABPROT  --   --   --  17.8*  --   INR  --   --   --  1.46  --   HEPARINUNFRC  --  >2.20*  --   --   --   CREATININE 0.76  --   --   --   --   TROPONINI  --  0.12* 0.08* 0.07*  --     Estimated Creatinine Clearance: 67.9 mL/min (by C-G formula based on SCr of 0.76 mg/dL).   Assessment: 66yo F presents with ACS. Pharmacy consulted to dose heparin. On Eliquis PTA for afib. Last dose 5/18 at 10am. Utilizing PTT to monitor heparin for now until levels correlating. Baseline heparin level > 2.2.  PTT 41 sec (subtherapeutic) on gtt at 1050 units/hr. Hgb down to 8.8, plt down to 138. RN reports that  line was beeping around 1030 AM this morning because patient was bending her arm. Moved line out of the bend and has had no infusion issues since then.   Goal of Therapy:  Heparin level 0.3-0.7 units/ml aPTT 66-102 seconds Monitor platelets by anticoagulation protocol: Yes   Plan:  Conservatively increase heparin to 1150 units/hr  Re-check APTT in 6 hours  Daily heparin level/aPTT  Monitor for signs/symptoms of bleeding   Uvaldo Bristle, PharmD PGY1 Pharmacy Resident Pager: [phone] 07/23/2016 2:36 PM

## 2016-07-24 DIAGNOSIS — I25110 Atherosclerotic heart disease of native coronary artery with unstable angina pectoris: Secondary | ICD-10-CM

## 2016-07-24 DIAGNOSIS — E782 Mixed hyperlipidemia: Secondary | ICD-10-CM

## 2016-07-24 DIAGNOSIS — I5033 Acute on chronic diastolic (congestive) heart failure: Secondary | ICD-10-CM

## 2016-07-24 LAB — CBC
HCT: 29.7 % — ABNORMAL LOW (ref 36.0–46.0)
Hemoglobin: 9.1 g/dL — ABNORMAL LOW (ref 12.0–15.0)
MCH: 28.3 pg (ref 26.0–34.0)
MCHC: 30.6 g/dL (ref 30.0–36.0)
MCV: 92.5 fL (ref 78.0–100.0)
Platelets: 175 10*3/uL (ref 150–400)
RBC: 3.21 MIL/uL — ABNORMAL LOW (ref 3.87–5.11)
RDW: 15.2 % (ref 11.5–15.5)
WBC: 5.3 10*3/uL (ref 4.0–10.5)

## 2016-07-24 LAB — APTT
aPTT: 120 seconds — ABNORMAL HIGH (ref 24–36)
aPTT: 56 seconds — ABNORMAL HIGH (ref 24–36)
aPTT: 60 seconds — ABNORMAL HIGH (ref 24–36)

## 2016-07-24 LAB — HEPARIN LEVEL (UNFRACTIONATED): Heparin Unfractionated: 1.06 IU/mL — ABNORMAL HIGH (ref 0.30–0.70)

## 2016-07-24 MED ORDER — SODIUM CHLORIDE 0.9 % WEIGHT BASED INFUSION
3.0000 mL/kg/h | INTRAVENOUS | Status: DC
  Administered 2016-07-25: 3 mL/kg/h via INTRAVENOUS

## 2016-07-24 MED ORDER — SODIUM CHLORIDE 0.9 % IV BOLUS (SEPSIS)
250.0000 mL | Freq: Once | INTRAVENOUS | Status: AC
  Administered 2016-07-24: 250 mL via INTRAVENOUS

## 2016-07-24 MED ORDER — ASPIRIN 81 MG PO CHEW
81.0000 mg | CHEWABLE_TABLET | ORAL | Status: AC
  Administered 2016-07-25: 81 mg via ORAL
  Filled 2016-07-24: qty 1

## 2016-07-24 MED ORDER — SODIUM CHLORIDE 0.9% FLUSH: 3.0000 mL | INTRAVENOUS | Status: DC | PRN

## 2016-07-24 MED ORDER — SODIUM CHLORIDE 0.9 % IV SOLN: 250.0000 mL | INTRAVENOUS | Status: DC | PRN

## 2016-07-24 MED ORDER — ASPIRIN EC 81 MG PO TBEC
81.0000 mg | DELAYED_RELEASE_TABLET | Freq: Every day | ORAL | Status: DC
  Administered 2016-07-26: 81 mg via ORAL
  Filled 2016-07-24: qty 1

## 2016-07-24 MED ORDER — SODIUM CHLORIDE 0.9 % WEIGHT BASED INFUSION: 1.0000 mL/kg/h | INTRAVENOUS | Status: DC

## 2016-07-24 MED ORDER — SODIUM CHLORIDE 0.9 % WEIGHT BASED INFUSION
1.0000 mL/kg/h | INTRAVENOUS | Status: DC
  Administered 2016-07-24: 1 mL/kg/h via INTRAVENOUS

## 2016-07-24 MED ORDER — SODIUM CHLORIDE 0.9% FLUSH
3.0000 mL | Freq: Two times a day (BID) | INTRAVENOUS | Status: DC
  Administered 2016-07-24 – 2016-07-25 (×2): 3 mL via INTRAVENOUS

## 2016-07-24 NOTE — Progress Notes (Signed)
Text paged Cardiology regarding low HR and B/P, held her Metoprolol 50 mg and will probably hold her NTG patch, took one on her chest off. HR 45-50 with B/P mid 80's/40's-50 and when checked manually 86/48, patient has had a very small amount of tea colored urine on BSC, will continue to monitor.

## 2016-07-24 NOTE — Progress Notes (Signed)
Per Bhagat (Cardiolog PA), no need to give Nitro ointment if patient is not having any chest pain.  12:00 & 18:00 doses held.   Jillyn Ledger, MBA, BSN, RN

## 2016-07-24 NOTE — Progress Notes (Signed)
ANTICOAGULATION CONSULT NOTE - Follow-up Consult  Pharmacy Consult for heparin Indication: chest pain/ACS  Allergies  Allergen Reactions  . Benadryl [Diphenhydramine] Shortness Of Breath  . Adhesive [Tape]     Burning of skin  . Amoxicillin Diarrhea    Has patient had a PCN reaction causing immediate rash, facial/tongue/throat swelling, SOB or lightheadedness with hypotension: No Has patient had a PCN reaction causing severe rash involving mucus membranes or skin necrosis: No Has patient had a PCN reaction that required hospitalization No Has patient had a PCN reaction occurring within the last 10 years: Yes If all of the above answers are "NO", then may proceed with Cephalosporin use.   Yvette Rack [Cyclobenzaprine] Other (See Comments)    Causes restless leg syndrome  . Hydrocodone Itching    Patient Measurements: Height: 5\' 1"  (154.9 cm) Weight: 176 lb 4.8 oz (80 kg) IBW/kg (Calculated) : 47.8 Heparin Dosing Weight: 65.5 kg  Vital Signs: Temp: 98.1 F (36.7 C) (05/20 1228) Temp Source: Oral (05/20 1228) BP: 118/75 (05/20 1228) Pulse Rate: 53 (05/20 1228)  Labs:  Recent Labs  07/22/16 1611  07/22/16 1914 07/23/16 0006 07/23/16 0437  07/23/16 2105 07/24/16 0242 07/24/16 1111  HGB 10.0*  --   --   --  8.8*  --   --  9.1*  --   HCT 31.8*  --   --   --  28.6*  --   --  29.7*  --   PLT 160  --   --   --  138*  --   --  175  --   APTT  --   < > 40*  --  48*  < > 67* 120* 56*  LABPROT  --   --   --   --  17.8*  --   --   --   --   INR  --   --   --   --  1.46  --   --   --   --   HEPARINUNFRC  --   --  >2.20*  --   --   --   --  1.06*  --   CREATININE 0.76  --   --   --   --   --   --   --   --   TROPONINI  --   --  0.12* 0.08* 0.07*  --   --   --   --   < > = values in this interval not displayed.  Estimated Creatinine Clearance: 68.1 mL/min (by C-G formula based on SCr of 0.76 mg/dL).   Assessment: 66yo F presents with ACS. Pharmacy consulted to dose heparin.  On Eliquis PTA for afib. Last dose 5/18 at 10am. Utilizing PTT to monitor heparin for now until levels correlating.  PTT 56 sec ,subtherapeutic on Heparin drip at 1000 units/hr.CBC this am was stable.  No bleeding reported   Goal of Therapy:  Heparin level 0.3-0.7 units/ml aPTT 66-102 seconds Monitor platelets by anticoagulation protocol: Yes   Plan:  Increase heparin to 1050 units/hr 6 hour aPTT   Daily heparin level/aPTT, CBC Monitor for signs/symptoms of bleeding   Uvaldo Bristle, PharmD PGY1 Pharmacy Resident Pager: [phone] 07/24/2016 12:59 PM

## 2016-07-24 NOTE — Progress Notes (Signed)
ANTICOAGULATION CONSULT NOTE - Follow Up Consult  Pharmacy Consult for heparin Indication: chest pain/ACS and atrial fibrillation   Labs:  Recent Labs  07/22/16 1611  07/22/16 1914 07/23/16 0006 07/23/16 0437 07/23/16 1319 07/23/16 2105 07/24/16 0242  HGB 10.0*  --   --   --  8.8*  --   --  9.1*  HCT 31.8*  --   --   --  28.6*  --   --  29.7*  PLT 160  --   --   --  138*  --   --  175  APTT  --   < > 40*  --  48* 41* 67* 120*  LABPROT  --   --   --   --  17.8*  --   --   --   INR  --   --   --   --  1.46  --   --   --   HEPARINUNFRC  --   --  >2.20*  --   --   --   --  1.06*  CREATININE 0.76  --   --   --   --   --   --   --   TROPONINI  --   --  0.12* 0.08* 0.07*  --   --   --   < > = values in this interval not displayed.   Assessment: 66yo female now above goal on heparin after one PTT at goal; labs drawn from arm opposite heparin infusion.  Goal of Therapy:  aPTT 66-102 seconds   Plan:  Will decrease heparin gtt by 2 units/kg/hr to 1000 units/hr and check PTT in 6hr.  Wynona Neat, PharmD, BCPS  07/24/2016,5:25 AM

## 2016-07-24 NOTE — Progress Notes (Signed)
Report received in patient's room via [facility] RN using [employer], reviewed VS, meds, labs and patient's general condition, assumed care of patient.

## 2016-07-24 NOTE — Progress Notes (Signed)
ANTICOAGULATION CONSULT NOTE - Follow-up Consult  Pharmacy Consult for heparin Indication: chest pain/ACS  Allergies  Allergen Reactions  . Benadryl [Diphenhydramine] Shortness Of Breath  . Adhesive [Tape]     Burning of skin  . Amoxicillin Diarrhea    Has patient had a PCN reaction causing immediate rash, facial/tongue/throat swelling, SOB or lightheadedness with hypotension: No Has patient had a PCN reaction causing severe rash involving mucus membranes or skin necrosis: No Has patient had a PCN reaction that required hospitalization No Has patient had a PCN reaction occurring within the last 10 years: Yes If all of the above answers are "NO", then may proceed with Cephalosporin use.   Yvette Rack [Cyclobenzaprine] Other (See Comments)    Causes restless leg syndrome  . Hydrocodone Itching    Patient Measurements: Height: 5\' 1"  (154.9 cm) Weight: 176 lb 4.8 oz (80 kg) IBW/kg (Calculated) : 47.8 Heparin Dosing Weight: 65.5 kg  Vital Signs: Temp: 98.3 F (36.8 C) (05/20 1717) Temp Source: Oral (05/20 1717) BP: 111/57 (05/20 1717) Pulse Rate: 53 (05/20 1717)  Labs:  Recent Labs  07/22/16 1611  07/22/16 1914 07/23/16 0006 07/23/16 0437  07/24/16 0242 07/24/16 1111 07/24/16 1901  HGB 10.0*  --   --   --  8.8*  --  9.1*  --   --   HCT 31.8*  --   --   --  28.6*  --  29.7*  --   --   PLT 160  --   --   --  138*  --  175  --   --   APTT  --   < > 40*  --  48*  < > 120* 56* 60*  LABPROT  --   --   --   --  17.8*  --   --   --   --   INR  --   --   --   --  1.46  --   --   --   --   HEPARINUNFRC  --   --  >2.20*  --   --   --  1.06*  --   --   CREATININE 0.76  --   --   --   --   --   --   --   --   TROPONINI  --   --  0.12* 0.08* 0.07*  --   --   --   --   < > = values in this interval not displayed.  Estimated Creatinine Clearance: 68.1 mL/min (by C-G formula based on SCr of 0.76 mg/dL).   Assessment: 66yo F presents with ACS. Pharmacy consulted to dose heparin.  On Eliquis PTA for afib. Last dose 5/18 at 10am. Utilizing PTT to monitor heparin for now until levels correlating.  Repeat aPTT remains subtherapeutic at 60 sec on heparin 1050 units/hr. Nurse reports no issues with infusion or bleeding.  Goal of Therapy:  Heparin level 0.3-0.7 units/ml aPTT 66-102 seconds Monitor platelets by anticoagulation protocol: Yes   Plan:  Increase heparin to 1150 units/hr 6 hour aPTT   Daily heparin level/aPTT, CBC Monitor for signs/symptoms of bleeding   Andrey Cota. Diona Foley, PharmD, BCPS Clinical Pharmacist [phone] 07/24/2016 8:05 PM

## 2016-07-24 NOTE — Progress Notes (Signed)
Text paged Cardiology because B/P and HR remain low,asymptomatic at this time, will continue to monitor, will give a 250cc NS bolus and see if her B/P comes up, no other changes at this time,

## 2016-07-24 NOTE — Progress Notes (Signed)
Progress Note  Patient Name: Wendy [name] Date of Encounter: 07/24/2016  Primary Cardiologist: Dr. [name]  Subjective   No chest pain or shortness of breath at rest. No recurrent palpitations.  Inpatient Medications    Scheduled Meds: . aspirin EC  81 mg Oral Daily  . atorvastatin  80 mg Oral q1800  . clopidogrel  75 mg Oral Daily  . ezetimibe  10 mg Oral Daily  . ferrous sulfate  325 mg Oral BID WC  . metoprolol tartrate  50 mg Oral BID  . nitroGLYCERIN  0.5 inch Topical Q6H  . omega-3 acid ethyl esters  1 g Oral Daily  . pantoprazole  40 mg Oral Daily  . ranolazine  1,000 mg Oral BID  . sertraline  150 mg Oral Daily   Continuous Infusions: . sodium chloride    . heparin 1,000 Units/hr (07/24/16 0552)   PRN Meds: sodium chloride, acetaminophen, albuterol, ALPRAZolam, baclofen, loratadine, nitroGLYCERIN, ondansetron (ZOFRAN) IV, oxyCODONE-acetaminophen, pramipexole   Vital Signs    Vitals:   07/24/16 0525 07/24/16 0605 07/24/16 0700 07/24/16 0924  BP: (!) 107/51 (!) 110/59 (!) 120/59 (!) 121/58  Pulse: (!) 54 (!) 49 (!) 56 (!) 54  Resp: 15 11 14 12   Temp:   98.2 F (36.8 C)   TempSrc:   Oral   SpO2: 100% 100% 100% 100%  Weight:      Height:        Intake/Output Summary (Last 24 hours) at 07/24/16 1127 Last data filed at 07/24/16 0500  Gross per 24 hour  Intake          1453.54 ml  Output             1950 ml  Net          -496.46 ml   Filed Weights   07/22/16 2030 07/23/16 0451 07/24/16 0414  Weight: 176 lb 9.6 oz (80.1 kg) 175 lb 6.4 oz (79.6 kg) 176 lb 4.8 oz (80 kg)    Telemetry    Sinus rhythm with rare PVC. Personally reviewed.  ECG    Tracing from 07/22/2016 shows sinus rhythm with inferolateral nonspecific ST/T abnormalities. Personally reviewed.  Physical Exam   GEN: No acute distress.   Neck: No JVD. Cardiac: RRR, no murmur, rub, or gallop.  Respiratory: Nonlabored. Few scattered rhonchi.Marland Kitchen GI: Soft, nontender, bowel sounds  present. MS: No edema; No deformity.  Labs    Chemistry Recent Labs Lab 07/22/16 1611  NA 141  K 3.7  CL 106  CO2 26  GLUCOSE 90  BUN 13  CREATININE 0.76  CALCIUM 8.4*  GFRNONAA >60  GFRAA >60  ANIONGAP 9     Hematology Recent Labs Lab 07/22/16 1611 07/23/16 0437 07/24/16 0242  WBC 5.9 3.9* 5.3  RBC 3.48* 3.11* 3.21*  HGB 10.0* 8.8* 9.1*  HCT 31.8* 28.6* 29.7*  MCV 91.4 92.0 92.5  MCH 28.7 28.3 28.3  MCHC 31.4 30.8 30.6  RDW 15.4 15.4 15.2  PLT 160 138* 175    Cardiac Enzymes Recent Labs Lab 07/22/16 1914 07/23/16 0006 07/23/16 0437  TROPONINI 0.12* 0.08* 0.07*    Recent Labs Lab 07/22/16 1622 07/22/16 1949  TROPIPOC 0.12* 0.08     BNP Recent Labs Lab 07/22/16 1914  BNP 954.4*     Radiology    Dg Chest Portable 1 View  Result Date: 07/22/2016 CLINICAL DATA:  Chest pain EXAM: PORTABLE CHEST 1 VIEW COMPARISON:  01/21/2015 FINDINGS: Borderline cardiomegaly. The patient is status  post CABG and coronary stenting. There is no edema, consolidation, effusion, or pneumothorax. No acute osseous finding. IMPRESSION: No acute finding. Electronically Signed   By: Monte Fantasia M.D.   On: 07/22/2016 16:09    Cardiac Studies   Echocardiogram 07/23/2016: Study Conclusions  - Left ventricle: The cavity size was normal. Wall thickness was   normal. Systolic function was normal. The estimated ejection   fraction was in the range of 60% to 65%. Wall motion was normal;   there were no regional wall motion abnormalities. The study is   not technically sufficient to allow evaluation of LV diastolic   function. - Aortic valve: Mildly calcified annulus. Trileaflet. - Mitral valve: Calcified annulus. There was trivial regurgitation. - Right atrium: Central venous pressure (est): 3 mm Hg. - Tricuspid valve: There was trivial regurgitation. - Pulmonary arteries: PA peak pressure: 24 mm Hg (S). - Pericardium, extracardiac: There was no pericardial  effusion.  Impressions:  - Normal LV wall thickness with LVEF 60-65%. Indeterminate   diastolic function. Mitral annular calcification with trivial   mitral regurgitation. Trivial tricuspid regurgitation with PASP   24 mmHg.  Myoview 07/15/2016:  Nuclear stress EF: 48%.  EKG nondiagnostic due to baseline changes  Very small region of distal anterior ischemia, cannot exclude shifting soft tissue Inferior / inferolateral defect consistent with soft tissue attenuation and/or scar  Intermediate risk study.  Patient Profile     66 y.o. female with CAD status post CABG and prior percutaneous intervention, now presenting with chest discomfort, also transient episode of atrial fibrillation with RVR. She has minor troponin I elevations and chronically abnormal ECG. LVEF normal by follow-up echocardiogram. She is scheduled for cardiac catheterization tomorrow.  Assessment & Plan    1. Unstable angina. Currently pain free. On heparin GTT with plan for cardiac catheterization tomorrow with Dr. [name].  2. Transient atrial fibrillation with RVR. Maintaining sinus rhythm. She is currently on beta blocker and Eliquis is on hold in anticipation of cardiac catheterization.  3. Acute on chronic diastolic heart failure. LVEF 60-65% by follow-up echocardiogram. Given Lasix yesterday.  4. Mixed hyperlipidemia, on Lipitor, Zetia, and Lovaza.  Continue current regimen including aspirin, Plavix, Lipitor, Zetia, Lopressor, Lovaza, Ranexa, and nitroglycerin. Also continues on Heparin GTT with Eliquis held for cardiac catheterization tomorrow.  Signed, Rozann Lesches, MD  07/24/2016, 11:27 AM

## 2016-07-25 ENCOUNTER — Encounter (HOSPITAL_COMMUNITY)
Admission: EM | Disposition: A | Discharge status: Home / Self Care | Payer: Self-pay | Source: Home / Self Care | Attending: Cardiovascular Disease

## 2016-07-25 DIAGNOSIS — I214 Non-ST elevation (NSTEMI) myocardial infarction: Secondary | ICD-10-CM

## 2016-07-25 HISTORY — PX: LEFT HEART CATH AND CORS/GRAFTS ANGIOGRAPHY: CATH118250

## 2016-07-25 LAB — CBC
HCT: 31.4 % — ABNORMAL LOW (ref 36.0–46.0)
Hemoglobin: 9.6 g/dL — ABNORMAL LOW (ref 12.0–15.0)
MCH: 28.3 pg (ref 26.0–34.0)
MCHC: 30.6 g/dL (ref 30.0–36.0)
MCV: 92.6 fL (ref 78.0–100.0)
Platelets: 182 10*3/uL (ref 150–400)
RBC: 3.39 MIL/uL — ABNORMAL LOW (ref 3.87–5.11)
RDW: 15.2 % (ref 11.5–15.5)
WBC: 4.4 10*3/uL (ref 4.0–10.5)

## 2016-07-25 LAB — BASIC METABOLIC PANEL
Anion gap: 9 (ref 5–15)
BUN: 7 mg/dL (ref 6–20)
CO2: 28 mmol/L (ref 22–32)
Calcium: 8.5 mg/dL — ABNORMAL LOW (ref 8.9–10.3)
Chloride: 104 mmol/L (ref 101–111)
Creatinine, Ser: 0.6 mg/dL (ref 0.44–1.00)
GFR calc Af Amer: >60 mL/min (ref >60)
GFR calc non Af Amer: >60 mL/min (ref >60)
Glucose, Bld: 100 mg/dL — ABNORMAL HIGH (ref 65–99)
Potassium: 3.6 mmol/L (ref 3.5–5.1)
Sodium: 141 mmol/L (ref 135–145)

## 2016-07-25 LAB — APTT: aPTT: 73 seconds — ABNORMAL HIGH (ref 24–36)

## 2016-07-25 LAB — POCT ACTIVATED CLOTTING TIME
Activated Clotting Time: 202 seconds
Activated Clotting Time: 180 seconds

## 2016-07-25 LAB — HEPARIN LEVEL (UNFRACTIONATED): Heparin Unfractionated: 0.8 IU/mL — ABNORMAL HIGH (ref 0.30–0.70)

## 2016-07-25 SURGERY — LEFT HEART CATH AND CORS/GRAFTS ANGIOGRAPHY
Anesthesia: LOCAL

## 2016-07-25 MED ORDER — HEPARIN (PORCINE) IN NACL 2-0.9 UNIT/ML-% IJ SOLN
INTRAMUSCULAR | Status: AC
  Filled 2016-07-25: qty 1000

## 2016-07-25 MED ORDER — HEPARIN (PORCINE) IN NACL 2-0.9 UNIT/ML-% IJ SOLN
INTRAMUSCULAR | Status: AC | PRN
  Administered 2016-07-25: 1500 mL

## 2016-07-25 MED ORDER — MAGNESIUM HYDROXIDE 400 MG/5ML PO SUSP
30.0000 mL | Freq: Every day | ORAL | Status: DC | PRN
  Administered 2016-07-25: 30 mL via ORAL
  Filled 2016-07-25: qty 30

## 2016-07-25 MED ORDER — IOPAMIDOL (ISOVUE-370) INJECTION 76%
INTRAVENOUS | Status: AC
  Filled 2016-07-25: qty 125

## 2016-07-25 MED ORDER — ACETAMINOPHEN 325 MG PO TABS: 650.0000 mg | ORAL_TABLET | ORAL | Status: DC | PRN

## 2016-07-25 MED ORDER — FENTANYL CITRATE (PF) 100 MCG/2ML IJ SOLN
INTRAMUSCULAR | Status: AC
  Filled 2016-07-25: qty 2

## 2016-07-25 MED ORDER — LIDOCAINE HCL (PF) 1 % IJ SOLN
INTRAMUSCULAR | Status: DC | PRN
  Administered 2016-07-25: 15 mL
  Administered 2016-07-25: 2 mL

## 2016-07-25 MED ORDER — SODIUM CHLORIDE 0.9% FLUSH
3.0000 mL | Freq: Two times a day (BID) | INTRAVENOUS | Status: DC
  Administered 2016-07-25 – 2016-07-26 (×3): 3 mL via INTRAVENOUS

## 2016-07-25 MED ORDER — ONDANSETRON HCL 4 MG/2ML IJ SOLN: 4.0000 mg | Freq: Four times a day (QID) | INTRAMUSCULAR | Status: DC | PRN

## 2016-07-25 MED ORDER — MIDAZOLAM HCL 2 MG/2ML IJ SOLN
INTRAMUSCULAR | Status: AC
  Filled 2016-07-25: qty 2

## 2016-07-25 MED ORDER — SODIUM CHLORIDE 0.9% FLUSH: 3.0000 mL | INTRAVENOUS | Status: DC | PRN

## 2016-07-25 MED ORDER — OXYCODONE-ACETAMINOPHEN 5-325 MG PO TABS
ORAL_TABLET | ORAL | Status: AC
  Filled 2016-07-25: qty 1

## 2016-07-25 MED ORDER — SODIUM CHLORIDE 0.9 % IV SOLN: 250.0000 mL | INTRAVENOUS | Status: DC | PRN

## 2016-07-25 MED ORDER — LIDOCAINE HCL 1 % IJ SOLN
INTRAMUSCULAR | Status: AC
  Filled 2016-07-25: qty 20

## 2016-07-25 MED ORDER — HEPARIN SODIUM (PORCINE) 1000 UNIT/ML IJ SOLN
INTRAMUSCULAR | Status: DC | PRN
  Administered 2016-07-25 (×2): 2000 [IU] via INTRAVENOUS
  Administered 2016-07-25: 1000 [IU] via INTRAVENOUS

## 2016-07-25 MED ORDER — MIDAZOLAM HCL 2 MG/2ML IJ SOLN
INTRAMUSCULAR | Status: DC | PRN
  Administered 2016-07-25: 2 mg via INTRAVENOUS

## 2016-07-25 MED ORDER — IOPAMIDOL (ISOVUE-370) INJECTION 76%
INTRAVENOUS | Status: DC | PRN
  Administered 2016-07-25: 75 mL via INTRA_ARTERIAL

## 2016-07-25 MED ORDER — HEPARIN SODIUM (PORCINE) 1000 UNIT/ML IJ SOLN
INTRAMUSCULAR | Status: AC
  Filled 2016-07-25: qty 1

## 2016-07-25 MED ORDER — FENTANYL CITRATE (PF) 100 MCG/2ML IJ SOLN
INTRAMUSCULAR | Status: DC | PRN
  Administered 2016-07-25: 25 ug via INTRAVENOUS

## 2016-07-25 SURGICAL SUPPLY — 16 items
CATH INFINITI 5 FR JL3.5 (CATHETERS) ×1 IMPLANT
CATH INFINITI 5FR AL1 (CATHETERS) ×1 IMPLANT
CATH INFINITI 5FR MULTPACK ANG (CATHETERS) ×1 IMPLANT
COVER PRB 48X5XTLSCP FOLD TPE (BAG) IMPLANT
COVER PROBE 5X48 (BAG) ×2
GLIDESHEATH SLEND SS 6F .021 (SHEATH) ×1 IMPLANT
GUIDEWIRE 3MM J TIP .035 145 (WIRE) ×1 IMPLANT
GUIDEWIRE INQWIRE 1.5J.035X260 (WIRE) IMPLANT
HOVERMATT SINGLE USE (MISCELLANEOUS) ×1 IMPLANT
INQWIRE 1.5J .035X260CM (WIRE) ×2
KIT HEART LEFT (KITS) ×2 IMPLANT
PACK CARDIAC CATHETERIZATION (CUSTOM PROCEDURE TRAY) ×2 IMPLANT
SHEATH PINNACLE 5F 10CM (SHEATH) ×1 IMPLANT
TRANSDUCER W/STOPCOCK (MISCELLANEOUS) ×2 IMPLANT
TUBING CIL FLEX 10 FLL-RA (TUBING) ×2 IMPLANT
WIRE HI TORQ VERSACORE-J 145CM (WIRE) ×1 IMPLANT

## 2016-07-25 NOTE — Progress Notes (Signed)
Report received via Jonni Sanger RN in patient's room using SBAR format, reviewed VS, orders and events of the day, assumed care of patient.

## 2016-07-25 NOTE — H&P (View-Only) (Signed)
Progress Note  Patient Name: Wendy [name] Date of Encounter: 07/25/2016  Primary Cardiologist: Nahser  Subjective   No chest pain.   Inpatient Medications    Scheduled Meds: . [START ON 07/26/2016] aspirin EC  81 mg Oral Daily  . atorvastatin  80 mg Oral q1800  . clopidogrel  75 mg Oral Daily  . ezetimibe  10 mg Oral Daily  . ferrous sulfate  325 mg Oral BID WC  . metoprolol tartrate  50 mg Oral BID  . nitroGLYCERIN  0.5 inch Topical Q6H  . omega-3 acid ethyl esters  1 g Oral Daily  . pantoprazole  40 mg Oral Daily  . ranolazine  1,000 mg Oral BID  . sertraline  150 mg Oral Daily  . sodium chloride flush  3 mL Intravenous Q12H   Continuous Infusions: . sodium chloride    . sodium chloride    . sodium chloride 1 mL/kg/hr (07/24/16 2206)  . sodium chloride 1 mL/kg/hr (07/25/16 0700)  . heparin 1,150 Units/hr (07/24/16 2214)   PRN Meds: sodium chloride, sodium chloride, acetaminophen, albuterol, ALPRAZolam, baclofen, loratadine, nitroGLYCERIN, ondansetron (ZOFRAN) IV, oxyCODONE-acetaminophen, pramipexole, sodium chloride flush   Vital Signs    Vitals:   07/24/16 2300 07/24/16 2355 07/25/16 0500 07/25/16 0735  BP:  (!) 140/58 (!) 109/49 126/61  Pulse: (!) 56 (!) 55 (!) 55 (!) 58  Resp: 15 16 15 16   Temp: 97.9 F (36.6 C)  98.7 F (37.1 C) 98 F (36.7 C)  TempSrc:    Oral  SpO2: 98% 100% 97% 98%  Weight:   178 lb 4.8 oz (80.9 kg)   Height:        Intake/Output Summary (Last 24 hours) at 07/25/16 5102 Last data filed at 07/25/16 0630  Gross per 24 hour  Intake           1310.3 ml  Output             1300 ml  Net             10.3 ml   Filed Weights   07/23/16 0451 07/24/16 0414 07/25/16 0500  Weight: 175 lb 6.4 oz (79.6 kg) 176 lb 4.8 oz (80 kg) 178 lb 4.8 oz (80.9 kg)    Telemetry    SB - Personally Reviewed  ECG    N/A - Personally Reviewed  Physical Exam   General: Well developed, well nourished, female appearing in no acute  distress. Head: Normocephalic, atraumatic.  Neck: Supple without bruits, JVD. Lungs:  Resp regular and unlabored, CTA. Heart: RRR, S1, S2, no S3, S4, or murmur; no rub. Abdomen: Soft, non-tender, non-distended with normoactive bowel sounds. No hepatomegaly. No rebound/guarding. No obvious abdominal masses. Extremities: No clubbing, cyanosis, 1+ RLE edema. Distal pedal pulses are 2+ bilaterally. Neuro: Alert and oriented X 3. Moves all extremities spontaneously. Psych: Normal affect.  Labs    Chemistry Recent Labs Lab 07/22/16 1611 07/25/16 0211  NA 141 141  K 3.7 3.6  CL 106 104  CO2 26 28  GLUCOSE 90 100*  BUN 13 7  CREATININE 0.76 0.60  CALCIUM 8.4* 8.5*  GFRNONAA >60 >60  GFRAA >60 >60  ANIONGAP 9 9     Hematology Recent Labs Lab 07/23/16 0437 07/24/16 0242 07/25/16 0211  WBC 3.9* 5.3 4.4  RBC 3.11* 3.21* 3.39*  HGB 8.8* 9.1* 9.6*  HCT 28.6* 29.7* 31.4*  MCV 92.0 92.5 92.6  MCH 28.3 28.3 28.3  MCHC 30.8 30.6 30.6  RDW  15.4 15.2 15.2  PLT 138* 175 182    Cardiac Enzymes Recent Labs Lab 07/22/16 1914 07/23/16 0006 07/23/16 0437  TROPONINI 0.12* 0.08* 0.07*    Recent Labs Lab 07/22/16 1622 07/22/16 1949  TROPIPOC 0.12* 0.08     BNP Recent Labs Lab 07/22/16 1914  BNP 954.4*     DDimer No results for input(s): DDIMER in the last 168 hours.    Radiology    No results found.  Cardiac Studies   Echocardiogram 07/23/2016: Study Conclusions  - Left ventricle: The cavity size was normal. Wall thickness was normal. Systolic function was normal. The estimated ejection fraction was in the range of 60% to 65%. Wall motion was normal; there were no regional wall motion abnormalities. The study is not technically sufficient to allow evaluation of LV diastolic function. - Aortic valve: Mildly calcified annulus. Trileaflet. - Mitral valve: Calcified annulus. There was trivial regurgitation. - Right atrium: Central venous pressure  (est): 3 mm Hg. - Tricuspid valve: There was trivial regurgitation. - Pulmonary arteries: PA peak pressure: 24 mm Hg (S). - Pericardium, extracardiac: There was no pericardial effusion.  Impressions:  - Normal LV wall thickness with LVEF 60-65%. Indeterminate diastolic function. Mitral annular calcification with trivial mitral regurgitation. Trivial tricuspid regurgitation with PASP 24 mmHg.  Myoview 07/15/2016:  Nuclear stress EF: 48%.  EKG nondiagnostic due to baseline changes  Very small region of distal anterior ischemia, cannot exclude shifting soft tissue Inferior / inferolateral defect consistent with soft tissue attenuation and/or scar  Intermediate risk study.  Patient Profile     66 y.o. female with CAD status post CABG and prior percutaneous intervention, now presenting with chest discomfort, also transient episode of atrial fibrillation with RVR. She had  minor troponin I elevations and chronically abnormal ECG. LVEF normal by follow-up echocardiogram. Planned for cardiac cath.   Assessment & Plan    1. Unstable Angina: Mild troponin leak. Remains on IV heparin. Planned for cardiac cath today.   2. PAF: On BB, Eliquis on hold. Remains on IV heparin.   3. Acute on chronic HF: Normal LV function on echo this admission. No WMA. Mild LE edema noted. Given a dose of IV lasix 5/19. Remains net -. States breathing is improved.   4. HL: On statin, zetia and lovaza.   Signed, Reino Bellis, NP  07/25/2016, 8:23 AM    Agree with note by Reino Bellis NP-C  Patient with known CAD status post bypass grafting and multiple interventions admitted on Friday with chest pain consistent with unstable angina. Her enzymes are minimally elevated. She has remained on IV heparin. She is pain-free. The decision was made to perform coronary angiography this morning. Dr. [name] will perform.   Lorretta Harp, M.D., [place], [facility], [place] [address] [place], [place]  [address]  [phone] 07/25/2016 10:47 AM

## 2016-07-25 NOTE — Progress Notes (Signed)
The patient developed a small hematoma at the brachial site. Pressure was held for 10 minutes and a cath lab nurse came up for assessment. The site is now soft with bruising. I will continue to monitor closely.  Saddie Benders RN

## 2016-07-25 NOTE — Interval H&P Note (Signed)
Cath Lab Visit (complete for each Cath Lab visit)  Clinical Evaluation Leading to the Procedure:   ACS: Yes.    Non-ACS:    Anginal Classification: CCS IV  Anti-ischemic medical therapy: Minimal Therapy (1 class of medications)  Non-Invasive Test Results: Intermediate-risk stress test findings: cardiac mortality 1-3%/year  Prior CABG: Previous CABG      History and Physical Interval Note:  07/25/2016 11:15 AM  Wendy [name]  has presented today for surgery, with the diagnosis of cp, cad  The various methods of treatment have been discussed with the patient and family. After consideration of risks, benefits and other options for treatment, the patient has consented to  Procedure(s): Left Heart Cath and Cors/Grafts Angiography (N/A) as a surgical intervention .  The patient's history has been reviewed, patient examined, no change in status, stable for surgery.  I have reviewed the patient's chart and labs.  Questions were answered to the patient's satisfaction.     Larae Grooms

## 2016-07-25 NOTE — Progress Notes (Signed)
Progress Note  Patient Name: Wendy [name] Date of Encounter: 07/25/2016  Primary Cardiologist: Nahser  Subjective   No chest pain.   Inpatient Medications    Scheduled Meds: . [START ON 07/26/2016] aspirin EC  81 mg Oral Daily  . atorvastatin  80 mg Oral q1800  . clopidogrel  75 mg Oral Daily  . ezetimibe  10 mg Oral Daily  . ferrous sulfate  325 mg Oral BID WC  . metoprolol tartrate  50 mg Oral BID  . nitroGLYCERIN  0.5 inch Topical Q6H  . omega-3 acid ethyl esters  1 g Oral Daily  . pantoprazole  40 mg Oral Daily  . ranolazine  1,000 mg Oral BID  . sertraline  150 mg Oral Daily  . sodium chloride flush  3 mL Intravenous Q12H   Continuous Infusions: . sodium chloride    . sodium chloride    . sodium chloride 1 mL/kg/hr (07/24/16 2206)  . sodium chloride 1 mL/kg/hr (07/25/16 0700)  . heparin 1,150 Units/hr (07/24/16 2214)   PRN Meds: sodium chloride, sodium chloride, acetaminophen, albuterol, ALPRAZolam, baclofen, loratadine, nitroGLYCERIN, ondansetron (ZOFRAN) IV, oxyCODONE-acetaminophen, pramipexole, sodium chloride flush   Vital Signs    Vitals:   07/24/16 2300 07/24/16 2355 07/25/16 0500 07/25/16 0735  BP:  (!) 140/58 (!) 109/49 126/61  Pulse: (!) 56 (!) 55 (!) 55 (!) 58  Resp: 15 16 15 16   Temp: 97.9 F (36.6 C)  98.7 F (37.1 C) 98 F (36.7 C)  TempSrc:    Oral  SpO2: 98% 100% 97% 98%  Weight:   178 lb 4.8 oz (80.9 kg)   Height:        Intake/Output Summary (Last 24 hours) at 07/25/16 0823 Last data filed at 07/25/16 0630  Gross per 24 hour  Intake           1310.3 ml  Output             1300 ml  Net             10.3 ml   Filed Weights   07/23/16 0451 07/24/16 0414 07/25/16 0500  Weight: 175 lb 6.4 oz (79.6 kg) 176 lb 4.8 oz (80 kg) 178 lb 4.8 oz (80.9 kg)    Telemetry    SB - Personally Reviewed  ECG    N/A - Personally Reviewed  Physical Exam   General: Well developed, well nourished, female appearing in no acute  distress. Head: Normocephalic, atraumatic.  Neck: Supple without bruits, JVD. Lungs:  Resp regular and unlabored, CTA. Heart: RRR, S1, S2, no S3, S4, or murmur; no rub. Abdomen: Soft, non-tender, non-distended with normoactive bowel sounds. No hepatomegaly. No rebound/guarding. No obvious abdominal masses. Extremities: No clubbing, cyanosis, 1+ RLE edema. Distal pedal pulses are 2+ bilaterally. Neuro: Alert and oriented X 3. Moves all extremities spontaneously. Psych: Normal affect.  Labs    Chemistry Recent Labs Lab 07/22/16 1611 07/25/16 0211  NA 141 141  K 3.7 3.6  CL 106 104  CO2 26 28  GLUCOSE 90 100*  BUN 13 7  CREATININE 0.76 0.60  CALCIUM 8.4* 8.5*  GFRNONAA >60 >60  GFRAA >60 >60  ANIONGAP 9 9     Hematology Recent Labs Lab 07/23/16 0437 07/24/16 0242 07/25/16 0211  WBC 3.9* 5.3 4.4  RBC 3.11* 3.21* 3.39*  HGB 8.8* 9.1* 9.6*  HCT 28.6* 29.7* 31.4*  MCV 92.0 92.5 92.6  MCH 28.3 28.3 28.3  MCHC 30.8 30.6 30.6  RDW  15.4 15.2 15.2  PLT 138* 175 182    Cardiac Enzymes Recent Labs Lab 07/22/16 1914 07/23/16 0006 07/23/16 0437  TROPONINI 0.12* 0.08* 0.07*    Recent Labs Lab 07/22/16 1622 07/22/16 1949  TROPIPOC 0.12* 0.08     BNP Recent Labs Lab 07/22/16 1914  BNP 954.4*     DDimer No results for input(s): DDIMER in the last 168 hours.    Radiology    No results found.  Cardiac Studies   Echocardiogram 07/23/2016: Study Conclusions  - Left ventricle: The cavity size was normal. Wall thickness was normal. Systolic function was normal. The estimated ejection fraction was in the range of 60% to 65%. Wall motion was normal; there were no regional wall motion abnormalities. The study is not technically sufficient to allow evaluation of LV diastolic function. - Aortic valve: Mildly calcified annulus. Trileaflet. - Mitral valve: Calcified annulus. There was trivial regurgitation. - Right atrium: Central venous pressure  (est): 3 mm Hg. - Tricuspid valve: There was trivial regurgitation. - Pulmonary arteries: PA peak pressure: 24 mm Hg (S). - Pericardium, extracardiac: There was no pericardial effusion.  Impressions:  - Normal LV wall thickness with LVEF 60-65%. Indeterminate diastolic function. Mitral annular calcification with trivial mitral regurgitation. Trivial tricuspid regurgitation with PASP 24 mmHg.  Myoview 07/15/2016:  Nuclear stress EF: 48%.  EKG nondiagnostic due to baseline changes  Very small region of distal anterior ischemia, cannot exclude shifting soft tissue Inferior / inferolateral defect consistent with soft tissue attenuation and/or scar  Intermediate risk study.  Patient Profile     66 y.o. female with CAD status post CABG and prior percutaneous intervention, now presenting with chest discomfort, also transient episode of atrial fibrillation with RVR. She had  minor troponin I elevations and chronically abnormal ECG. LVEF normal by follow-up echocardiogram. Planned for cardiac cath.   Assessment & Plan    1. Unstable Angina: Mild troponin leak. Remains on IV heparin. Planned for cardiac cath today.   2. PAF: On BB, Eliquis on hold. Remains on IV heparin.   3. Acute on chronic HF: Normal LV function on echo this admission. No WMA. Mild LE edema noted. Given a dose of IV lasix 5/19. Remains net -. States breathing is improved.   4. HL: On statin, zetia and lovaza.   Signed, Reino Bellis, NP  07/25/2016, 8:23 AM    Agree with note by Reino Bellis NP-C  Patient with known CAD status post bypass grafting and multiple interventions admitted on Friday with chest pain consistent with unstable angina. Her enzymes are minimally elevated. She has remained on IV heparin. She is pain-free. The decision was made to perform coronary angiography this morning. Dr. [name] will perform.   Lorretta Harp, M.D., [place], [facility], [place] [address] [place], [place]  [address]  [phone] 07/25/2016 10:47 AM

## 2016-07-25 NOTE — Progress Notes (Signed)
ACT 180

## 2016-07-25 NOTE — Progress Notes (Signed)
ANTICOAGULATION CONSULT NOTE - Follow Up Consult  Pharmacy Consult for heparin Indication: Afib and USAP   Labs:  Recent Labs  07/22/16 1611  07/22/16 1914 07/23/16 0006 07/23/16 0437  07/24/16 0242 07/24/16 1111 07/24/16 1901 07/25/16 0211  HGB 10.0*  --   --   --  8.8*  --  9.1*  --   --  9.6*  HCT 31.8*  --   --   --  28.6*  --  29.7*  --   --  31.4*  PLT 160  --   --   --  138*  --  175  --   --  182  APTT  --   < > 40*  --  48*  < > 120* 56* 60* 73*  LABPROT  --   --   --   --  17.8*  --   --   --   --   --   INR  --   --   --   --  1.46  --   --   --   --   --   HEPARINUNFRC  --   --  >2.20*  --   --   --  1.06*  --   --  0.80*  CREATININE 0.76  --   --   --   --   --   --   --   --   --   TROPONINI  --   --  0.12* 0.08* 0.07*  --   --   --   --   --   < > = values in this interval not displayed.   Assessment/Plan:  66yo female therapeutic on heparin after rate changes. Will continue gtt at current rate and confirm stable with additional PTT.   Wynona Neat, PharmD, BCPS  07/25/2016,3:02 AM

## 2016-07-26 ENCOUNTER — Encounter (HOSPITAL_COMMUNITY): Payer: Self-pay | Admitting: Interventional Cardiology

## 2016-07-26 DIAGNOSIS — R748 Abnormal levels of other serum enzymes: Secondary | ICD-10-CM

## 2016-07-26 DIAGNOSIS — R7989 Other specified abnormal findings of blood chemistry: Secondary | ICD-10-CM | POA: Diagnosis present

## 2016-07-26 DIAGNOSIS — R778 Other specified abnormalities of plasma proteins: Secondary | ICD-10-CM | POA: Diagnosis present

## 2016-07-26 DIAGNOSIS — I5043 Acute on chronic combined systolic (congestive) and diastolic (congestive) heart failure: Secondary | ICD-10-CM | POA: Diagnosis present

## 2016-07-26 DIAGNOSIS — Z951 Presence of aortocoronary bypass graft: Secondary | ICD-10-CM

## 2016-07-26 DIAGNOSIS — I5041 Acute combined systolic (congestive) and diastolic (congestive) heart failure: Secondary | ICD-10-CM

## 2016-07-26 LAB — CBC
HCT: 29.1 % — ABNORMAL LOW (ref 36.0–46.0)
Hemoglobin: 9.1 g/dL — ABNORMAL LOW (ref 12.0–15.0)
MCH: 28.7 pg (ref 26.0–34.0)
MCHC: 31.3 g/dL (ref 30.0–36.0)
MCV: 91.8 fL (ref 78.0–100.0)
Platelets: 133 10*3/uL — ABNORMAL LOW (ref 150–400)
RBC: 3.17 MIL/uL — ABNORMAL LOW (ref 3.87–5.11)
RDW: 15.2 % (ref 11.5–15.5)
WBC: 4.2 10*3/uL (ref 4.0–10.5)

## 2016-07-26 MED ORDER — PANTOPRAZOLE SODIUM 40 MG PO TBEC: 40.0000 mg | DELAYED_RELEASE_TABLET | Freq: Every day | ORAL | 3 refills | Status: DC

## 2016-07-26 MED ORDER — ISOSORBIDE MONONITRATE ER 60 MG PO TB24: 60.0000 mg | ORAL_TABLET | ORAL | 2 refills | Status: DC

## 2016-07-26 MED ORDER — ACETAMINOPHEN 325 MG PO TABS: 650.0000 mg | ORAL_TABLET | ORAL | Status: DC | PRN

## 2016-07-26 MED ORDER — ASPIRIN 81 MG PO TBEC: 81.0000 mg | DELAYED_RELEASE_TABLET | Freq: Every day | ORAL | Status: DC

## 2016-07-26 MED ORDER — FUROSEMIDE 20 MG PO TABS: 20.0000 mg | ORAL_TABLET | Freq: Every day | ORAL | 3 refills | Status: DC

## 2016-07-26 MED ORDER — APIXABAN 5 MG PO TABS: 5.0000 mg | ORAL_TABLET | Freq: Two times a day (BID) | ORAL | Status: DC

## 2016-07-26 NOTE — Progress Notes (Signed)
Patient has an order for RN to order MOM for constipation and she is starting to have some pressure in her lower abdomen, will give it, also text paged [facility] re: Heparin was running this am and now she has returned from her cath and has Severe 3 vessel disease. Dr Koleen Nimrod called back and was updated and is going to have [place] just monitor tonight and let the team in the AM know so that they can address, will continue to monitor, no orders received at this time.

## 2016-07-26 NOTE — Discharge Instructions (Signed)
Angina Pectoris Angina pectoris is a very bad feeling in the chest, neck, or arm. Your doctor may call it angina. There are four types of angina. Angina is caused by a lack of blood in the middle and thickest layer of the heart wall (myocardium). Angina may feel like a crushing or squeezing pain in the chest. It may feel like tightness or heavy pressure in the chest. Some people say it feels like gas, heartburn, or indigestion. Some people have symptoms other than pain. These include:  Shortness of breath.  Cold sweats.  Feeling sick to your stomach (nausea).  Feeling light-headed. Many women have chest discomfort and some of the other symptoms. However, women often have different symptoms, such as:  Feeling tired (fatigue).  Feeling nervous for no reason.  Feeling weak for no reason.  Dizziness or fainting. Women may have angina without any symptoms. Follow these instructions at home:  Take medicines only as told by your doctor.  Take care of other health issues as told by your doctor. These include:  High blood pressure (hypertension).  Diabetes.  Follow a heart-healthy diet. Your doctor can help you to choose healthy food options and make changes.  Talk to your doctor to learn more about healthy cooking methods and use them. These include:  Roasting.  Grilling.  Broiling.  Baking.  Poaching.  Steaming.  Stir-frying.  Follow an exercise program approved by your doctor.  Keep a healthy weight. Lose weight as told by your doctor.  Rest when you are tired.  Learn to manage stress.  Do not use any tobacco, such as cigarettes, chewing tobacco, or electronic cigarettes. If you need help quitting, ask your doctor.  If you drink alcohol, and your doctor says it is okay, limit yourself to no more than 1 drink per day. One drink equals 12 ounces of beer, 5 ounces of wine, or 1 ounces of hard liquor.  Stop illegal drug use.  Keep all follow-up visits as told by  your doctor. This is important. Do not take these medicines unless your doctor says that you can:  Nonsteroidal anti-inflammatory drugs (NSAIDs). These include:  Ibuprofen.  Naproxen.  Celecoxib.  Vitamin supplements that have vitamin A, vitamin E, or both.  Hormone therapy that contains estrogen with or without progestin. Get help right away if:  You have pain in your chest, neck, arm, jaw, stomach, or back that:  Lasts more than a few minutes.  Comes back.  Does not get better after you take medicine under your tongue (sublingual nitroglycerin).  You have any of these symptoms for no reason:  Gas, heartburn, or indigestion.  Sweating a lot.  Shortness of breath or trouble breathing.  Feeling sick to your stomach or throwing up.  Feeling more tired than usual.  Feeling nervous or worrying more than usual.  Feeling weak.  Diarrhea.  You are suddenly dizzy or light-headed.  You faint or pass out. These symptoms may be an emergency. Do not wait to see if the symptoms will go away. Get medical help right away. Call your local emergency services (911 in the [place] Do not drive yourself to the hospital. This information is not intended to replace advice given to you by your health care provider. Make sure you discuss any questions you have with your health care provider. Document Released: 08/10/2007 Document Revised: 07/30/2015 Document Reviewed: 06/25/2013 Elsevier Interactive Patient Education  2017 [place] on my medicine - ELIQUIS (apixaban)  This medication education was reviewed  with me or my healthcare representative as part of my discharge preparation.   Why was Eliquis prescribed for you? Eliquis was prescribed for you to reduce the risk of forming blood clots that can cause a stroke if you have a medical condition called atrial fibrillation (a type of irregular heartbeat) OR to reduce the risk of a blood clots forming after orthopedic  surgery.  What do You need to know about Eliquis ? Take your Eliquis TWICE DAILY - one tablet in the morning and one tablet in the evening with or without food.  It would be best to take the doses about the same time each day.  If you have difficulty swallowing the tablet whole please discuss with your pharmacist how to take the medication safely.  Take Eliquis exactly as prescribed by your doctor and DO NOT stop taking Eliquis without talking to the doctor who prescribed the medication.  Stopping may increase your risk of developing a new clot or stroke.  Refill your prescription before you run out.  After discharge, you should have regular check-up appointments with your healthcare provider that is prescribing your Eliquis.  In the future your dose may need to be changed if your kidney function or weight changes by a significant amount or as you get older.  What do you do if you miss a dose? If you miss a dose, take it as soon as you remember on the same day and resume taking twice daily.  Do not take more than one dose of ELIQUIS at the same time.  Important Safety Information A possible side effect of Eliquis is bleeding. You should call your healthcare provider right away if you experience any of the following: ? Bleeding from an injury or your nose that does not stop. ? Unusual colored urine (red or dark brown) or unusual colored stools (red or black). ? Unusual bruising for unknown reasons. ? A serious fall or if you hit your head (even if there is no bleeding).  Some medicines may interact with Eliquis and might increase your risk of bleeding or clotting while on Eliquis. To help avoid this, consult your healthcare provider or pharmacist prior to using any new prescription or non-prescription medications, including herbals, vitamins, non-steroidal anti-inflammatory drugs (NSAIDs) and supplements.  This website has more information on Eliquis (apixaban): [email or web address].no.

## 2016-07-26 NOTE — Progress Notes (Signed)
Updated report received in patient's room via Otila Kluver RN using SBAR format, reviewed test results, new orders and events of the day, assumed care of patient.

## 2016-07-26 NOTE — Plan of Care (Signed)
Problem: Pain Managment: Goal: General experience of comfort will improve Outcome: Progressing Patient's pain has been adequately controlled with muscle relaxer and occasional Percocet. Patient has chronic pain to right hip and lower back but is controlled and manageable, will continue to monitor.

## 2016-07-26 NOTE — Care Management Important Message (Signed)
Important Message  Patient Details  Name: Wendy [name] MRN: [id number] Date of Birth: 05/10/1950   Medicare Important Message Given:  Yes    Nathen May 07/26/2016, 10:53 AM

## 2016-07-26 NOTE — Discharge Summary (Signed)
Discharge Summary    Patient ID: Wendy [name],  MRN: [id number], DOB/AGE: 1950/08/20 66 y.o.  Admit date: 07/22/2016 Discharge date: 07/26/2016  Primary Care Provider: Josetta Huddle Primary Cardiologist: Dr [name]  Discharge Diagnoses    Principal Problem:   Chest pain with high risk of acute coronary syndrome Active Problems:   Hx of CABG   HLD (hyperlipidemia)   Chronic back pain   GERD (gastroesophageal reflux disease)   PAF (paroxysmal atrial fibrillation) ([place])   Essential (primary) hypertension   Primary osteoarthritis of right hip   Chronic anticoagulation   CAD S/P multiple PCIs-last one 2016   NSTEMI (non-ST elevated myocardial infarction) ([place])   Acute combined systolic and diastolic heart failure ([facility])   Troponin level elevated   Allergies Allergies  Allergen Reactions  . Benadryl [Diphenhydramine] Shortness Of Breath  . Adhesive [Tape]     Burning of skin  . Amoxicillin Diarrhea    Has patient had a PCN reaction causing immediate rash, facial/tongue/throat swelling, SOB or lightheadedness with hypotension: No Has patient had a PCN reaction causing severe rash involving mucus membranes or skin necrosis: No Has patient had a PCN reaction that required hospitalization No Has patient had a PCN reaction occurring within the last 10 years: Yes If all of the above answers are "NO", then may proceed with Cephalosporin use.   Yvette Rack [Cyclobenzaprine] Other (See Comments)    Causes restless leg syndrome  . Hydrocodone Itching    Diagnostic Studies/Procedures   Echo 07/23/16 Cath 07/25/16  _____________   History of Present Illness     66 y/o with extensive CAD admitted to the ED from PCP office with chest pain.  Hospital Course     66 y/o female with a history of CAD, s/p CABG in '98 with an LIMA-LAD, SVG-OM, and SVG-RCA. Post op course complicated by post op MI secondary to native RCA occlusion. She then had a PCI to the [facility] Feb 2012, PCI  again SVG-RCA 2013, and 2016. Other problems include PAF, on Eliquis, HTN, HLD, obesity, and DJD.  She had a Myoview 07/15/16 as a pre op clearance for hip surgery. The Myoview was low to moderate risk with possible anterior ischemia. She underwent Rt THR 07/19/16 without problems.   On the day of admission she went to her PCP office for a routine appointment. There she relayed a history of intermittent SSCP "pressure" x 3 weeks. She had chest pain in his office relieved with SL NTG and was sent to the ED. In the ED she described SSCP that radiated to her jaw. Her Troponin was elevated as was her BNP. She was admitted and her Eliquis was held and she was started on Heparin. Nitrates were also started. An echo done 07/23/16 showed an EF of 60-65%. She was given IV Lasix for a few doses. Cath done 07/25/16 showed occluded grafts to the RCA and OM1 with a 50% native LAD (ulcerative). She had L-R collaterals. Her EF at cath was initially read as depressed but later re evaluated and felt to be more consistent with the echo finding- EF 45-50%. Dr Gwenlyn Found saw the pt on the morning of 07/26/16. He felt she could be discharged on low dose Lasix. We'll arrange for a TOC office visit in 7-10 days and a f/u with dr [name] in 4-6 weeks. It was noted that she was on 120 mg of Imdur BID prior to admission. She was pain free on low dose nitro paste in  the hospital. It was felt she may have developed some resistance to nitrates secondary to a high chronic BID dose and this was decreased at discharge.  _____________  Discharge Vitals Blood pressure 110/64, pulse (!) 52, temperature 98.3 F (36.8 C), temperature source Oral, resp. rate 18, height 5\' 1"  (1.549 m), weight 180 lb (81.6 kg), SpO2 100 %.  Filed Weights   07/24/16 0414 07/25/16 0500 07/26/16 0605  Weight: 176 lb 4.8 oz (80 kg) 178 lb 4.8 oz (80.9 kg) 180 lb (81.6 kg)   Chest -clear to A&P Cardiac- RRR Extrem- no edema Neuro- intact  Labs & Radiologic Studies      CBC  Recent Labs  07/25/16 0211 07/26/16 0756  WBC 4.4 4.2  HGB 9.6* 9.1*  HCT 31.4* 29.1*  MCV 92.6 91.8  PLT 182 409*   Basic Metabolic Panel  Recent Labs  07/25/16 0211  NA 141  K 3.6  CL 104  CO2 28  GLUCOSE 100*  BUN 7  CREATININE 0.60  CALCIUM 8.5*   Liver Function Tests No results for input(s): AST, ALT, ALKPHOS, BILITOT, PROT, ALBUMIN in the last 72 hours. No results for input(s): LIPASE, AMYLASE in the last 72 hours. Cardiac Enzymes No results for input(s): CKTOTAL, CKMB, CKMBINDEX, TROPONINI in the last 72 hours. BNP Invalid input(s): POCBNP D-Dimer No results for input(s): DDIMER in the last 72 hours. Hemoglobin A1C No results for input(s): HGBA1C in the last 72 hours. Fasting Lipid Panel No results for input(s): CHOL, HDL, LDLCALC, TRIG, CHOLHDL, LDLDIRECT in the last 72 hours. Thyroid Function Tests No results for input(s): TSH, T4TOTAL, T3FREE, THYROIDAB in the last 72 hours.  Invalid input(s): FREET3 _____________  Dg Chest Portable 1 View  Result Date: 07/22/2016 CLINICAL DATA:  Chest pain EXAM: PORTABLE CHEST 1 VIEW COMPARISON:  01/21/2015 FINDINGS: Borderline cardiomegaly. The patient is status post CABG and coronary stenting. There is no edema, consolidation, effusion, or pneumothorax. No acute osseous finding. IMPRESSION: No acute finding. Electronically Signed   By: Monte Fantasia M.D.   On: 07/22/2016 16:09   Dg C-arm 1-60 Min  Result Date: 07/19/2016 CLINICAL DATA:  Right hip replacement. EXAM: DG C-ARM 61-120 MIN; OPERATIVE RIGHT HIP WITH PELVIS COMPARISON:  MRI 12/30/2015 . FINDINGS: Total right hip replacement.  Hardware intact.  Anatomic alignment. IMPRESSION: Total right hip replacement with anatomic alignment. Electronically Signed   By: Marcello Moores  Register   On: 07/19/2016 13:04   Dg Hip Operative Unilat W Or W/o Pelvis Right  Result Date: 07/19/2016 CLINICAL DATA:  Right hip replacement. EXAM: DG C-ARM 61-120 MIN; OPERATIVE RIGHT  HIP WITH PELVIS COMPARISON:  MRI 12/30/2015 . FINDINGS: Total right hip replacement.  Hardware intact.  Anatomic alignment. IMPRESSION: Total right hip replacement with anatomic alignment. Electronically Signed   By: Marcello Moores  Register   On: 07/19/2016 13:04   Disposition   Pt is being discharged home today in good condition.  Follow-up Plans & Appointments    Follow-up Information    Nahser, Wonda Cheng, MD Follow up.   Specialty:  Cardiology Why:  Office will conatct you to see an APP in 7-10 days- Keep your previously scheduled appointment for Dr [name] 8/23- 2pm as well Contact information: [address] [place] [address] [place] [address] [phone]            Discharge Medications   Current Discharge Medication List    START taking these medications   Details  acetaminophen (TYLENOL) 325 MG tablet Take 2 tablets (650  mg total) by mouth every 4 (four) hours as needed for headache or mild pain.    aspirin EC 81 MG EC tablet Take 1 tablet (81 mg total) by mouth daily.    furosemide (LASIX) 20 MG tablet Take 1 tablet (20 mg total) by mouth daily. Qty: 90 tablet, Refills: 3    pantoprazole (PROTONIX) 40 MG tablet Take 1 tablet (40 mg total) by mouth daily. Qty: 90 tablet, Refills: 3      CONTINUE these medications which have CHANGED   Details  isosorbide mononitrate (IMDUR) 60 MG 24 hr tablet Take 1 tablet (60 mg total) by mouth every morning. Qty: 90 tablet, Refills: 2      CONTINUE these medications which have NOT CHANGED   Details  albuterol (PROVENTIL HFA;VENTOLIN HFA) 108 (90 BASE) MCG/ACT inhaler Inhale 2 puffs into the lungs every 6 (six) hours as needed for wheezing or shortness of breath.    ALPRAZolam (XANAX) 0.5 MG tablet Take 0.5 mg by mouth 3 (three) times daily as needed for anxiety.    apixaban (ELIQUIS) 5 MG TABS tablet Take 1 tablet (5 mg total) by mouth 2 (two) times daily. Qty: 60 tablet, Refills: 11    atorvastatin (LIPITOR) 80 MG tablet  TAKE 1 TABLET (80 MG TOTAL) BY MOUTH DAILY AT 6 PM. Qty: 90 tablet, Refills: 3    baclofen (LIORESAL) 20 MG tablet Take 20 mg by mouth 3 (three) times daily as needed for muscle spasms.    cetirizine (ZYRTEC) 10 MG tablet Take 10 mg by mouth daily as needed for allergies.     cholecalciferol (VITAMIN D) 1000 units tablet Take 2,000 Units by mouth daily.    clopidogrel (PLAVIX) 75 MG tablet Take 75 mg by mouth daily.    diclofenac sodium (VOLTAREN) 1 % GEL Apply 1 g topically daily as needed (pain).    ezetimibe (ZETIA) 10 MG tablet Take 10 mg by mouth daily.    Fish Oil-Cholecalciferol (FISH OIL + D3 PO) Take 1 capsule by mouth daily. 1200 mg fish oil 600 mg Omega 3 2000 IU Vitamin D    metoprolol (LOPRESSOR) 50 MG tablet Take 1 tablet (50 mg total) by mouth 2 (two) times daily. Qty: 180 tablet, Refills: 3    nitroGLYCERIN (NITROSTAT) 0.4 MG SL tablet Place 0.4 mg under the tongue every 5 (five) minutes as needed for chest pain.    ondansetron (ZOFRAN) 4 MG tablet Take 1 tablet (4 mg total) by mouth every 8 (eight) hours as needed for nausea or vomiting. Qty: 40 tablet, Refills: 0    oxyCODONE-acetaminophen (ROXICET) 5-325 MG tablet Take 1-2 tablets by mouth every 4 (four) hours as needed for severe pain. Qty: 40 tablet, Refills: 0    pramipexole (MIRAPEX) 0.25 MG tablet Take 0.25 mg by mouth at bedtime as needed.    ranolazine (RANEXA) 1000 MG SR tablet Take 1,000 mg by mouth 2 (two) times daily.    sertraline (ZOLOFT) 100 MG tablet Take 150 mg by mouth daily. Takes 1.5 tablets daily      STOP taking these medications     omeprazole (PRILOSEC) 20 MG capsule            Outstanding Labs/Studies     Duration of Discharge Encounter   Greater than 30 minutes including physician time.  Signed, Kerin Ransom PA-C 07/26/2016, 11:49 AM  Agree with note written by Kerin Ransom [facility]  OK for DC. Re start Eliquis. EF by my inspection 45-50%. Suspect min elevated  Trop  secondary to demand ischemia from Afib. Will start low dose lasix. Med Rx of CAD. ROV with [facility]LP 7 days (TOC 7) and Dr [name] 3-4 weeks.  Quay Burow 07/26/2016 11:53 AM

## 2016-07-26 NOTE — Progress Notes (Addendum)
Updated report received in patient's room via Otila Kluver RN  using SBAR format, reviewed events of the day, test results, POC, assumed care of patient.

## 2016-07-29 DIAGNOSIS — M1611 Unilateral primary osteoarthritis, right hip: Secondary | ICD-10-CM | POA: Diagnosis not present

## 2016-07-31 DIAGNOSIS — I739 Peripheral vascular disease, unspecified: Secondary | ICD-10-CM | POA: Diagnosis not present

## 2016-07-31 DIAGNOSIS — J449 Chronic obstructive pulmonary disease, unspecified: Secondary | ICD-10-CM | POA: Diagnosis not present

## 2016-07-31 DIAGNOSIS — I480 Paroxysmal atrial fibrillation: Secondary | ICD-10-CM | POA: Diagnosis not present

## 2016-07-31 DIAGNOSIS — G629 Polyneuropathy, unspecified: Secondary | ICD-10-CM | POA: Diagnosis not present

## 2016-07-31 DIAGNOSIS — Z471 Aftercare following joint replacement surgery: Secondary | ICD-10-CM | POA: Diagnosis not present

## 2016-07-31 DIAGNOSIS — I2510 Atherosclerotic heart disease of native coronary artery without angina pectoris: Secondary | ICD-10-CM | POA: Diagnosis not present

## 2016-08-02 ENCOUNTER — Encounter: Payer: Self-pay | Admitting: Physician Assistant

## 2016-08-02 DIAGNOSIS — I2510 Atherosclerotic heart disease of native coronary artery without angina pectoris: Secondary | ICD-10-CM | POA: Diagnosis not present

## 2016-08-02 DIAGNOSIS — I480 Paroxysmal atrial fibrillation: Secondary | ICD-10-CM | POA: Diagnosis not present

## 2016-08-02 DIAGNOSIS — I739 Peripheral vascular disease, unspecified: Secondary | ICD-10-CM | POA: Diagnosis not present

## 2016-08-02 DIAGNOSIS — Z471 Aftercare following joint replacement surgery: Secondary | ICD-10-CM | POA: Diagnosis not present

## 2016-08-02 DIAGNOSIS — G629 Polyneuropathy, unspecified: Secondary | ICD-10-CM | POA: Diagnosis not present

## 2016-08-02 DIAGNOSIS — J449 Chronic obstructive pulmonary disease, unspecified: Secondary | ICD-10-CM | POA: Diagnosis not present

## 2016-08-02 NOTE — Progress Notes (Deleted)
Cardiology Office Note    Date:  08/02/2016   ID:  Wendy [name], DOB 1951-01-26, MRN [id number]  PCP:  Josetta Huddle, MD  Cardiologist:  Dr. [name]  Chief Complaint: Chest pain   History of Present Illness:   Wendy [name] is a 66 y.o. female with hx of CAD, PAF on eliquis, HTN, HLD and DJD presents for hospital follow up.   Hx of CAD s/p CABG in '98 with an LIMA-LAD, SVG-OM, and SVG-RCA. Post op course complicated by post op MI secondary to native RCA occlusion. She then had a PCI to the [facility] Feb 2012, PCI again SVG-RCA 2013, and 2016.   She had a Myoview 07/15/16 as a pre op clearance for hip surgery. The Myoview was low to moderate risk with possible anterior ischemia. She underwent Rt THR 07/19/16 without problems.   She was admitted 5/18-5/22 for chest pressure. Troponin and BNP was elevated. An echo done 07/23/16 showed an EF of 60-65%. She was given IV Lasix for a few doses. Cath done 07/25/16 showed occluded grafts to the RCA and OM1 with a 50% native LAD (ulcerative). She had L-R collaterals. Her EF at cath was initially read as depressed but later re evaluated and felt to be more consistent with the echo finding- EF 45-50%. It was noted that she was on 120 mg of Imdur BID prior to admission. She was pain free on low dose nitro paste in the hospital. It was felt she may have developed some resistance to nitrates secondary to a high chronic BID dose and this was decreased at discharge.    Here today for follow up.  Past Medical History:  Diagnosis Date  . Anemia   . Anginal pain (HCC)    jaw pain  . Anxiety   . Arthritis   . CAD (coronary artery disease), native coronary artery    a. Hx CABG 1998, last PCI 2013 b.LHC 07/2013:  EF 55-60%, L-LAD prob atretic, no sig disease in LAD, S-OM1 ok with patent stent, S-dRCA ok => med Rx. c. LHC 01/2015 DES to SVG-RCA.  Marland Kitchen Chronic back pain   . Chronic leg pain   . COPD (chronic obstructive pulmonary disease) ([place])   . GERD  (gastroesophageal reflux disease)   . Glucose intolerance (impaired glucose tolerance)   . HLD (hyperlipidemia)   . Hypertension   . Myocardial infarction ([place])   . PAD (peripheral artery disease) ([place])   . PAF (paroxysmal atrial fibrillation) ([place])    a. 09/2013 post-op b. 01/2015  . Peripheral neuropathy   . Shortness of breath    when walking  . Sleep apnea    uses a cpap  . Wears glasses     Past Surgical History:  Procedure Laterality Date  . CARDIAC CATHETERIZATION N/A 01/22/2015   Procedure: Left Heart Cath and Coronary Angiography;  Surgeon: Troy Sine, MD;  Location: [place] [facility];  Service: Cardiovascular;  Laterality: N/A;  . CARDIAC CATHETERIZATION N/A 01/22/2015   Procedure: Coronary Stent Intervention;  Surgeon: Troy Sine, MD;  Location: [place] [facility];  Service: Cardiovascular;  Laterality: N/A;  3.0x12 xience prox SVG to RCA  . CARDIAC SURGERY  1998   LIMA-LAD, SVG-OM, SVG-RCA  . [place] and again several years later  . CHOLECYSTECTOMY  09/17/2013   dr [name]  . CHOLECYSTECTOMY N/A 09/16/2013   Procedure: LAPAROSCOPIC CHOLECYSTECTOMY CONVERTED TO OPEN CHOLECYSTECTOMY WITH CHOLANGIOGRAM;  Surgeon: [name], MD;  Location: [place];  Service: General;  Laterality: N/A;  . CORONARY ANGIOPLASTY WITH STENT PLACEMENT  11/2011, 02/2012, 01/2015   DES to SVG-RCA both times, In [place], [place], Dr. Bruce Donath  . ERCP N/A 09/19/2013   Procedure: ENDOSCOPIC RETROGRADE CHOLANGIOPANCREATOGRAPHY (ERCP);  Surgeon: [name], MD;  Location: [place];  Service: Endoscopy;  Laterality: N/A;  . FINGER SURGERY Right    ring finger  . i and d right subcostal wound  10/17/13  . IRRIGATION AND DEBRIDEMENT ABSCESS Right 10/17/2013   Procedure: INCISION AND DRAINAGE RIGHT SUBCOSTAL WOUND;  Surgeon: [name], MD;  Location: [facility];  Service: General;  Laterality: Right;  . KNEE ARTHROSCOPY Right 1998  . LEFT HEART CATH AND  CORS/GRAFTS ANGIOGRAPHY N/A 07/25/2016   Procedure: Left Heart Cath and Cors/Grafts Angiography;  Surgeon: Jettie Booze, MD;  Location: [place] [facility];  Service: Cardiovascular;  Laterality: N/A;  . LEFT HEART CATHETERIZATION WITH CORONARY/GRAFT ANGIOGRAM N/A 07/19/2013   Procedure: LEFT HEART CATHETERIZATION WITH Beatrix Fetters;  Surgeon: Leonie Man, MD;  Location: [facility];  Service: Cardiovascular;  Laterality: N/A;  . MASS EXCISION Right 06/25/2014   Procedure: EXCISION BUTTOCK MASS ;  Surgeon: Coralie Keens, MD;  Location: [place];  Service: General;  Laterality: Right;  . ORIF HUMERUS FRACTURE Left 08/27/2013   Procedure: OPEN REDUCTION INTERNAL FIXATION (ORIF) LEFT HUMERUS ;  Surgeon: Rozanna Box, MD;  Location: [place];  Service: Orthopedics;  Laterality: Left;  . TOTAL HIP ARTHROPLASTY Right 07/19/2016   Procedure: TOTAL HIP ARTHROPLASTY ANTERIOR APPROACH;  Surgeon: [name], MD;  Location: [place];  Service: Orthopedics;  Laterality: Right;    Current Medications: Prior to Admission medications   Medication Sig Start Date End Date Taking? Authorizing Provider  acetaminophen (TYLENOL) 325 MG tablet Take 2 tablets (650 mg total) by mouth every 4 (four) hours as needed for headache or mild pain. 07/26/16   Erlene Quan, PA-C  albuterol (PROVENTIL HFA;VENTOLIN HFA) 108 (90 BASE) MCG/ACT inhaler Inhale 2 puffs into the lungs every 6 (six) hours as needed for wheezing or shortness of breath.    [provider]  ALPRAZolam Duanne Moron) 0.5 MG tablet Take 0.5 mg by mouth 3 (three) times daily as needed for anxiety.    [provider]  apixaban (ELIQUIS) 5 MG TABS tablet Take 1 tablet (5 mg total) by mouth 2 (two) times daily. 01/07/16   Nahser, Wonda Cheng, MD  aspirin EC 81 MG EC tablet Take 1 tablet (81 mg total) by mouth daily. 07/27/16   Erlene Quan, PA-C  atorvastatin (LIPITOR) 80 MG tablet TAKE 1 TABLET (80 MG TOTAL) BY  MOUTH DAILY AT 6 PM. 10/05/15   Strader, [place] M, PA-C  baclofen (LIORESAL) 20 MG tablet Take 20 mg by mouth 3 (three) times daily as needed for muscle spasms.    [provider]  cetirizine (ZYRTEC) 10 MG tablet Take 10 mg by mouth daily as needed for allergies.     [provider]  cholecalciferol (VITAMIN D) 1000 units tablet Take 2,000 Units by mouth daily.    [provider]  clopidogrel (PLAVIX) 75 MG tablet Take 75 mg by mouth daily.    [provider]  diclofenac sodium (VOLTAREN) 1 % GEL Apply 1 g topically daily as needed (pain).    [provider]  ezetimibe (ZETIA) 10 MG tablet Take 10 mg by mouth daily.    [provider]  Fish Oil-Cholecalciferol (FISH OIL + D3 PO) Take 1 capsule by mouth daily. 1200 mg fish oil 600 mg Omega 3 2000 IU Vitamin D    [provider]  furosemide (LASIX) 20 MG tablet Take 1 tablet (20 mg total) by mouth daily. 07/26/16 07/26/17  Erlene Quan, PA-C  isosorbide mononitrate (IMDUR) 60 MG 24 hr tablet Take 1 tablet (60 mg total) by mouth every morning. 07/26/16 07/26/17  Erlene Quan, PA-C  metoprolol (LOPRESSOR) 50 MG tablet Take 1 tablet (50 mg total) by mouth 2 (two) times daily. 06/24/16   Nahser, Wonda Cheng, MD  nitroGLYCERIN (NITROSTAT) 0.4 MG SL tablet Place 0.4 mg under the tongue every 5 (five) minutes as needed for chest pain.    [provider]  ondansetron (ZOFRAN) 4 MG tablet Take 1 tablet (4 mg total) by mouth every 8 (eight) hours as needed for nausea or vomiting. 07/19/16   Prudencio Burly III, PA-C  oxyCODONE-acetaminophen (ROXICET) 5-325 MG tablet Take 1-2 tablets by mouth every 4 (four) hours as needed for severe pain. 07/19/16   Prudencio Burly III, PA-C  pantoprazole (PROTONIX) 40 MG tablet Take 1 tablet (40 mg total) by mouth daily. 07/27/16   Erlene Quan, PA-C  pramipexole (MIRAPEX) 0.25 MG tablet Take 0.25 mg by mouth at bedtime as needed.    [provider]  ranolazine (RANEXA) 1000 MG SR tablet Take 1,000 mg by mouth 2 (two) times daily.    [provider]  sertraline (ZOLOFT) 100 MG tablet Take 150 mg by mouth daily. Takes 1.5 tablets daily    [provider]    Allergies:   Benadryl [diphenhydramine]; Adhesive [tape]; Amoxicillin; Flexeril [cyclobenzaprine]; and Hydrocodone   Social History   Social History  . Marital status: Divorced    Spouse name: N/A  . Number of children: 5  . Years of education: N/A   Occupational History  . Disabled    Social History Main Topics  . Smoking status: Former Smoker    Quit date: 07/20/1998  . Smokeless tobacco: Never Used  . Alcohol use No  . Drug use: No  . Sexual activity: Not on file   Other Topics Concern  . Not on file   Social History Narrative   Lives with best friend.      Family History:  The patient's family history includes Clotting disorder in her father; Heart disease in her brother and brother; Lung cancer in her mother and sister. ***  ROS:   Please see the history of present illness.    ROS All other systems reviewed and are negative.   PHYSICAL EXAM:   VS:  There were no vitals taken for this visit.   GEN: Well nourished, well developed, in no acute distress  HEENT: normal  Neck: no JVD, carotid bruits, or masses Cardiac: ***RRR; no murmurs, rubs, or gallops,no edema  Respiratory:  clear to auscultation bilaterally, normal work of breathing GI: soft, nontender, nondistended, + BS MS: no deformity or atrophy  Skin: warm and dry, no rash Neuro:  Alert and Oriented x 3, Strength and sensation are intact Psych: euthymic mood, full affect  Wt Readings from Last 3 Encounters:  07/26/16 180 lb (81.6 kg)  07/19/16 174 lb (78.9 kg)  07/15/16 174 lb (78.9 kg)      Studies/Labs Reviewed:   EKG:  EKG is ordered today.  The ekg ordered today demonstrates ***  Recent Labs: 08/13/2015: ALT 14 07/22/2016: B Natriuretic Peptide  954.4;  TSH 1.357 07/25/2016: BUN 7; Creatinine, Ser 0.60; Potassium 3.6; Sodium 141 07/26/2016: Hemoglobin 9.1; Platelets 133   Lipid Panel    Component Value Date/Time   CHOL 138 08/13/2015 0939   TRIG 241 (H) 08/13/2015 0939   HDL 34 (L) 08/13/2015 0939   CHOLHDL 4.1 08/13/2015 0939   VLDL 48 (H) 08/13/2015 0939   LDLCALC 56 08/13/2015 0939    Additional studies/ records that were reviewed today include:   Echocardiogram: 07/23/16 Study Conclusions  - Left ventricle: The cavity size was normal. Wall thickness was   normal. Systolic function was normal. The estimated ejection   fraction was in the range of 60% to 65%. Wall motion was normal;   there were no regional wall motion abnormalities. The study is   not technically sufficient to allow evaluation of LV diastolic   function. - Aortic valve: Mildly calcified annulus. Trileaflet. - Mitral valve: Calcified annulus. There was trivial regurgitation. - Right atrium: Central venous pressure (est): 3 mm Hg. - Tricuspid valve: There was trivial regurgitation. - Pulmonary arteries: PA peak pressure: 24 mm Hg (S). - Pericardium, extracardiac: There was no pericardial effusion.  Impressions:  - Normal LV wall thickness with LVEF 60-65%. Indeterminate   diastolic function. Mitral annular calcification with trivial   mitral regurgitation. Trivial tricuspid regurgitation with PASP   24 mmHg.  Left Heart Cath and Cors/Grafts Angiography  07/25/16  Conclusion     Severe three-vessel coronary artery disease.  Unable to gain access from the left common femoral. It appears there is a left to right fem-fem graft based on the wire course after needle access to the vessel.  Occluded SVG to marginal, known from prior. LIMA to LAD known to be atretic from prior cath.  SVG to PDA occluded. This is a new finding.  Mid LAD lesion, 50 %stenosed. This is an ulcerated, irregular lesion. There are left to right collaterals.  The left  ventricular ejection fraction is 25-35% by visual estimate.  There is moderate to severe left ventricular systolic dysfunction.  LV end diastolic pressure is moderately elevated.  There is no aortic valve stenosis.  Of note, poor radial pulses bilaterally. Right brachial artery was accessed using ultrasound guidance.   I suspect her troponin elevation is from heart failure. It does not have a typical ACS pattern. I don't think her SVG to RCA occlusion is acute.  Continue medical therapy for heart failure.  Consider viability study. If inferior wall is viable, could consider cardiac surgery consultation for repeat CABG.     ASSESSMENT & PLAN:    1. CAD s/p CABG and multiple PCI - Recent cath as above.   2. PAF  3. HLD  3. Presume chronic diastolic CHF - EF of 16-10% by cath. Echo showed EF of 60-65%, not sufficient to determine diastolic CHF.   Medication Adjustments/Labs and Tests Ordered: Current medicines are reviewed at length with the patient today.  Concerns regarding medicines are outlined above.  Medication changes, Labs and Tests ordered today are listed in the Patient Instructions below. There are no Patient Instructions on file for this visit.   Jarrett Soho, [place]  08/02/2016 3:18 PM    [facility] [address], [place], [place]  [address] Phone: [phone]; Fax: [phone]

## 2016-08-03 ENCOUNTER — Ambulatory Visit: Payer: Medicare HMO | Admitting: Physician Assistant

## 2016-08-03 DIAGNOSIS — M1611 Unilateral primary osteoarthritis, right hip: Secondary | ICD-10-CM | POA: Diagnosis not present

## 2016-08-04 ENCOUNTER — Other Ambulatory Visit: Payer: Self-pay | Admitting: *Deleted

## 2016-08-04 ENCOUNTER — Encounter: Payer: Self-pay | Admitting: Physician Assistant

## 2016-08-04 MED ORDER — APIXABAN 5 MG PO TABS: 5.0000 mg | ORAL_TABLET | Freq: Two times a day (BID) | ORAL | 3 refills | Status: DC

## 2016-08-04 NOTE — Telephone Encounter (Signed)
Paper request received for Eliquis 5mg , pt is 66 yrs old, Crea-0.60 on 07/25/16, Wt-79.3kg & last seen by Dr. [name] on 02/11/16. Refill sent as requested.

## 2016-08-05 ENCOUNTER — Ambulatory Visit: Payer: Medicare HMO | Admitting: Physician Assistant

## 2016-08-05 DIAGNOSIS — W19XXXA Unspecified fall, initial encounter: Secondary | ICD-10-CM

## 2016-08-05 HISTORY — DX: Unspecified fall, initial encounter: W19.XXXA

## 2016-08-08 DIAGNOSIS — Z471 Aftercare following joint replacement surgery: Secondary | ICD-10-CM | POA: Diagnosis not present

## 2016-08-08 DIAGNOSIS — I480 Paroxysmal atrial fibrillation: Secondary | ICD-10-CM | POA: Diagnosis not present

## 2016-08-08 DIAGNOSIS — J449 Chronic obstructive pulmonary disease, unspecified: Secondary | ICD-10-CM | POA: Diagnosis not present

## 2016-08-08 DIAGNOSIS — I739 Peripheral vascular disease, unspecified: Secondary | ICD-10-CM | POA: Diagnosis not present

## 2016-08-08 DIAGNOSIS — G629 Polyneuropathy, unspecified: Secondary | ICD-10-CM | POA: Diagnosis not present

## 2016-08-08 DIAGNOSIS — I2510 Atherosclerotic heart disease of native coronary artery without angina pectoris: Secondary | ICD-10-CM | POA: Diagnosis not present

## 2016-08-08 DIAGNOSIS — G4733 Obstructive sleep apnea (adult) (pediatric): Secondary | ICD-10-CM | POA: Diagnosis not present

## 2016-08-11 DIAGNOSIS — Z471 Aftercare following joint replacement surgery: Secondary | ICD-10-CM | POA: Diagnosis not present

## 2016-08-11 DIAGNOSIS — G629 Polyneuropathy, unspecified: Secondary | ICD-10-CM | POA: Diagnosis not present

## 2016-08-11 DIAGNOSIS — I739 Peripheral vascular disease, unspecified: Secondary | ICD-10-CM | POA: Diagnosis not present

## 2016-08-11 DIAGNOSIS — I480 Paroxysmal atrial fibrillation: Secondary | ICD-10-CM | POA: Diagnosis not present

## 2016-08-11 DIAGNOSIS — J449 Chronic obstructive pulmonary disease, unspecified: Secondary | ICD-10-CM | POA: Diagnosis not present

## 2016-08-11 DIAGNOSIS — I2510 Atherosclerotic heart disease of native coronary artery without angina pectoris: Secondary | ICD-10-CM | POA: Diagnosis not present

## 2016-08-15 ENCOUNTER — Emergency Department (HOSPITAL_COMMUNITY): Payer: Medicare HMO

## 2016-08-15 ENCOUNTER — Observation Stay (HOSPITAL_COMMUNITY)
Admission: EM | Admit: 2016-08-15 | Discharge: 2016-08-16 | Disposition: A | Discharge status: Home Health | Payer: Medicare HMO | Attending: Internal Medicine | Admitting: Internal Medicine

## 2016-08-15 ENCOUNTER — Observation Stay (HOSPITAL_COMMUNITY): Payer: Medicare HMO

## 2016-08-15 ENCOUNTER — Encounter (HOSPITAL_COMMUNITY): Payer: Self-pay | Admitting: Emergency Medicine

## 2016-08-15 DIAGNOSIS — S0083XA Contusion of other part of head, initial encounter: Secondary | ICD-10-CM | POA: Insufficient documentation

## 2016-08-15 DIAGNOSIS — T148XXA Other injury of unspecified body region, initial encounter: Secondary | ICD-10-CM

## 2016-08-15 DIAGNOSIS — D649 Anemia, unspecified: Secondary | ICD-10-CM | POA: Diagnosis not present

## 2016-08-15 DIAGNOSIS — I25110 Atherosclerotic heart disease of native coronary artery with unstable angina pectoris: Secondary | ICD-10-CM | POA: Insufficient documentation

## 2016-08-15 DIAGNOSIS — R410 Disorientation, unspecified: Secondary | ICD-10-CM

## 2016-08-15 DIAGNOSIS — F1210 Cannabis abuse, uncomplicated: Secondary | ICD-10-CM | POA: Diagnosis not present

## 2016-08-15 DIAGNOSIS — W06XXXA Fall from bed, initial encounter: Secondary | ICD-10-CM | POA: Diagnosis not present

## 2016-08-15 DIAGNOSIS — G9341 Metabolic encephalopathy: Principal | ICD-10-CM | POA: Diagnosis present

## 2016-08-15 DIAGNOSIS — Z955 Presence of coronary angioplasty implant and graft: Secondary | ICD-10-CM | POA: Insufficient documentation

## 2016-08-15 DIAGNOSIS — I480 Paroxysmal atrial fibrillation: Secondary | ICD-10-CM | POA: Diagnosis not present

## 2016-08-15 DIAGNOSIS — Z9181 History of falling: Secondary | ICD-10-CM | POA: Insufficient documentation

## 2016-08-15 DIAGNOSIS — R4182 Altered mental status, unspecified: Secondary | ICD-10-CM | POA: Diagnosis not present

## 2016-08-15 DIAGNOSIS — F1910 Other psychoactive substance abuse, uncomplicated: Secondary | ICD-10-CM | POA: Diagnosis not present

## 2016-08-15 DIAGNOSIS — Z951 Presence of aortocoronary bypass graft: Secondary | ICD-10-CM

## 2016-08-15 DIAGNOSIS — R69 Illness, unspecified: Secondary | ICD-10-CM

## 2016-08-15 DIAGNOSIS — E785 Hyperlipidemia, unspecified: Secondary | ICD-10-CM | POA: Insufficient documentation

## 2016-08-15 DIAGNOSIS — G4730 Sleep apnea, unspecified: Secondary | ICD-10-CM | POA: Insufficient documentation

## 2016-08-15 DIAGNOSIS — D509 Iron deficiency anemia, unspecified: Secondary | ICD-10-CM

## 2016-08-15 DIAGNOSIS — Z91048 Other nonmedicinal substance allergy status: Secondary | ICD-10-CM | POA: Insufficient documentation

## 2016-08-15 DIAGNOSIS — M545 Low back pain: Secondary | ICD-10-CM

## 2016-08-15 DIAGNOSIS — Z7901 Long term (current) use of anticoagulants: Secondary | ICD-10-CM

## 2016-08-15 DIAGNOSIS — I5022 Chronic systolic (congestive) heart failure: Secondary | ICD-10-CM | POA: Insufficient documentation

## 2016-08-15 DIAGNOSIS — Z96641 Presence of right artificial hip joint: Secondary | ICD-10-CM

## 2016-08-15 DIAGNOSIS — S0990XA Unspecified injury of head, initial encounter: Secondary | ICD-10-CM | POA: Diagnosis not present

## 2016-08-15 DIAGNOSIS — G629 Polyneuropathy, unspecified: Secondary | ICD-10-CM | POA: Insufficient documentation

## 2016-08-15 DIAGNOSIS — Y92003 Bedroom of unspecified non-institutional (private) residence as the place of occurrence of the external cause: Secondary | ICD-10-CM | POA: Insufficient documentation

## 2016-08-15 DIAGNOSIS — F419 Anxiety disorder, unspecified: Secondary | ICD-10-CM | POA: Diagnosis not present

## 2016-08-15 DIAGNOSIS — J449 Chronic obstructive pulmonary disease, unspecified: Secondary | ICD-10-CM | POA: Diagnosis not present

## 2016-08-15 DIAGNOSIS — R220 Localized swelling, mass and lump, head: Secondary | ICD-10-CM | POA: Diagnosis not present

## 2016-08-15 DIAGNOSIS — K219 Gastro-esophageal reflux disease without esophagitis: Secondary | ICD-10-CM | POA: Diagnosis not present

## 2016-08-15 DIAGNOSIS — M549 Dorsalgia, unspecified: Secondary | ICD-10-CM | POA: Insufficient documentation

## 2016-08-15 DIAGNOSIS — M7989 Other specified soft tissue disorders: Secondary | ICD-10-CM | POA: Diagnosis not present

## 2016-08-15 DIAGNOSIS — Z7982 Long term (current) use of aspirin: Secondary | ICD-10-CM | POA: Insufficient documentation

## 2016-08-15 DIAGNOSIS — S61411A Laceration without foreign body of right hand, initial encounter: Secondary | ICD-10-CM | POA: Insufficient documentation

## 2016-08-15 DIAGNOSIS — W19XXXA Unspecified fall, initial encounter: Secondary | ICD-10-CM

## 2016-08-15 DIAGNOSIS — S79911A Unspecified injury of right hip, initial encounter: Secondary | ICD-10-CM | POA: Diagnosis not present

## 2016-08-15 DIAGNOSIS — Z6834 Body mass index (BMI) 34.0-34.9, adult: Secondary | ICD-10-CM | POA: Diagnosis not present

## 2016-08-15 DIAGNOSIS — F1410 Cocaine abuse, uncomplicated: Secondary | ICD-10-CM | POA: Insufficient documentation

## 2016-08-15 DIAGNOSIS — Z87891 Personal history of nicotine dependence: Secondary | ICD-10-CM | POA: Insufficient documentation

## 2016-08-15 DIAGNOSIS — R400 Somnolence: Secondary | ICD-10-CM | POA: Diagnosis not present

## 2016-08-15 DIAGNOSIS — I110 Hypertensive heart disease with heart failure: Secondary | ICD-10-CM | POA: Diagnosis not present

## 2016-08-15 DIAGNOSIS — Z7902 Long term (current) use of antithrombotics/antiplatelets: Secondary | ICD-10-CM | POA: Insufficient documentation

## 2016-08-15 DIAGNOSIS — E669 Obesity, unspecified: Secondary | ICD-10-CM | POA: Diagnosis not present

## 2016-08-15 DIAGNOSIS — R000 Tachycardia, unspecified: Secondary | ICD-10-CM

## 2016-08-15 DIAGNOSIS — Z79899 Other long term (current) drug therapy: Secondary | ICD-10-CM | POA: Diagnosis not present

## 2016-08-15 DIAGNOSIS — G8929 Other chronic pain: Secondary | ICD-10-CM | POA: Diagnosis not present

## 2016-08-15 DIAGNOSIS — Z888 Allergy status to other drugs, medicaments and biological substances status: Secondary | ICD-10-CM | POA: Insufficient documentation

## 2016-08-15 DIAGNOSIS — Z880 Allergy status to penicillin: Secondary | ICD-10-CM | POA: Insufficient documentation

## 2016-08-15 DIAGNOSIS — I214 Non-ST elevation (NSTEMI) myocardial infarction: Secondary | ICD-10-CM | POA: Insufficient documentation

## 2016-08-15 DIAGNOSIS — S0180XA Unspecified open wound of other part of head, initial encounter: Secondary | ICD-10-CM | POA: Diagnosis not present

## 2016-08-15 DIAGNOSIS — E876 Hypokalemia: Secondary | ICD-10-CM | POA: Insufficient documentation

## 2016-08-15 DIAGNOSIS — M25551 Pain in right hip: Secondary | ICD-10-CM | POA: Diagnosis not present

## 2016-08-15 HISTORY — DX: Unspecified fall, initial encounter: W19.XXXA

## 2016-08-15 LAB — PROTIME-INR
INR: 1.63
INR: 1.49
Prothrombin Time: 19.5 seconds — ABNORMAL HIGH (ref 11.4–15.2)
Prothrombin Time: 18.1 seconds — ABNORMAL HIGH (ref 11.4–15.2)

## 2016-08-15 LAB — CBC
HCT: 32 % — ABNORMAL LOW (ref 36.0–46.0)
Hemoglobin: 9.7 g/dL — ABNORMAL LOW (ref 12.0–15.0)
MCH: 27.6 pg (ref 26.0–34.0)
MCHC: 30.3 g/dL (ref 30.0–36.0)
MCV: 90.9 fL (ref 78.0–100.0)
Platelets: 270 10*3/uL (ref 150–400)
RBC: 3.52 MIL/uL — ABNORMAL LOW (ref 3.87–5.11)
RDW: 15.2 % (ref 11.5–15.5)
WBC: 6.8 10*3/uL (ref 4.0–10.5)

## 2016-08-15 LAB — I-STAT TROPONIN, ED: Troponin i, poc: 0 ng/mL (ref 0.00–0.08)

## 2016-08-15 LAB — URINALYSIS, ROUTINE W REFLEX MICROSCOPIC
Bilirubin Urine: NEGATIVE
Glucose, UA: NEGATIVE mg/dL
Hgb urine dipstick: NEGATIVE
Ketones, ur: NEGATIVE mg/dL
Leukocytes, UA: NEGATIVE
Nitrite: NEGATIVE
Protein, ur: NEGATIVE mg/dL
Specific Gravity, Urine: 1.016 (ref 1.005–1.030)
pH: 6 (ref 5.0–8.0)

## 2016-08-15 LAB — COMPREHENSIVE METABOLIC PANEL
ALT: 10 U/L — ABNORMAL LOW (ref 14–54)
AST: 16 U/L (ref 15–41)
Albumin: 3 g/dL — ABNORMAL LOW (ref 3.5–5.0)
Alkaline Phosphatase: 79 U/L (ref 38–126)
Anion gap: 10 (ref 5–15)
BUN: 20 mg/dL (ref 6–20)
CO2: 26 mmol/L (ref 22–32)
Calcium: 9.3 mg/dL (ref 8.9–10.3)
Chloride: 108 mmol/L (ref 101–111)
Creatinine, Ser: 0.91 mg/dL (ref 0.44–1.00)
GFR calc Af Amer: >60 mL/min (ref >60)
GFR calc non Af Amer: >60 mL/min (ref >60)
Glucose, Bld: 127 mg/dL — ABNORMAL HIGH (ref 65–99)
Potassium: 3.4 mmol/L — ABNORMAL LOW (ref 3.5–5.1)
Sodium: 144 mmol/L (ref 135–145)
Total Bilirubin: 0.5 mg/dL (ref 0.3–1.2)
Total Protein: 7 g/dL (ref 6.5–8.1)

## 2016-08-15 LAB — I-STAT ARTERIAL BLOOD GAS, ED
Acid-Base Excess: 1 mmol/L (ref 0.0–2.0)
Bicarbonate: 24.7 mmol/L (ref 20.0–28.0)
O2 Saturation: 94 %
Patient temperature: 98.6
TCO2: 26 mmol/L (ref 0–100)
pCO2 arterial: 36.6 mmHg (ref 32.0–48.0)
pH, Arterial: 7.438 (ref 7.350–7.450)
pO2, Arterial: 70 mmHg — ABNORMAL LOW (ref 83.0–108.0)

## 2016-08-15 LAB — ETHANOL: Alcohol, Ethyl (B): <5 mg/dL (ref <5)

## 2016-08-15 LAB — I-STAT CG4 LACTIC ACID, ED: Lactic Acid, Venous: 1.22 mmol/L (ref 0.5–1.9)

## 2016-08-15 LAB — RAPID URINE DRUG SCREEN, HOSP PERFORMED
Amphetamines: NOT DETECTED
Barbiturates: NOT DETECTED
Benzodiazepines: POSITIVE — AB
Cocaine: POSITIVE — AB
Opiates: NOT DETECTED
Tetrahydrocannabinol: POSITIVE — AB

## 2016-08-15 LAB — TSH: TSH: 0.295 u[IU]/mL — ABNORMAL LOW (ref 0.350–4.500)

## 2016-08-15 LAB — AMMONIA: Ammonia: 19 umol/L (ref 9–35)

## 2016-08-15 LAB — ACETAMINOPHEN LEVEL: Acetaminophen (Tylenol), Serum: <10 ug/mL — ABNORMAL LOW (ref 10–30)

## 2016-08-15 MED ORDER — ACETAMINOPHEN 325 MG PO TABS
650.0000 mg | ORAL_TABLET | ORAL | Status: DC | PRN
  Administered 2016-08-15 – 2016-08-16 (×2): 650 mg via ORAL
  Filled 2016-08-15 (×2): qty 2

## 2016-08-15 MED ORDER — ONDANSETRON HCL 4 MG PO TABS: 4.0000 mg | ORAL_TABLET | Freq: Four times a day (QID) | ORAL | Status: DC | PRN

## 2016-08-15 MED ORDER — SERTRALINE HCL 50 MG PO TABS
150.0000 mg | ORAL_TABLET | Freq: Every day | ORAL | Status: DC
  Administered 2016-08-16: 150 mg via ORAL
  Filled 2016-08-15: qty 1

## 2016-08-15 MED ORDER — ALBUTEROL SULFATE (2.5 MG/3ML) 0.083% IN NEBU
2.5000 mg | INHALATION_SOLUTION | Freq: Four times a day (QID) | RESPIRATORY_TRACT | Status: DC | PRN
  Administered 2016-08-16: 2.5 mg via RESPIRATORY_TRACT
  Filled 2016-08-15: qty 3

## 2016-08-15 MED ORDER — FUROSEMIDE 20 MG PO TABS
20.0000 mg | ORAL_TABLET | Freq: Every day | ORAL | Status: DC
  Administered 2016-08-15 – 2016-08-16 (×2): 20 mg via ORAL
  Filled 2016-08-15 (×2): qty 1

## 2016-08-15 MED ORDER — ACETAMINOPHEN 325 MG PO TABS: 650.0000 mg | ORAL_TABLET | Freq: Four times a day (QID) | ORAL | Status: DC | PRN

## 2016-08-15 MED ORDER — ASPIRIN EC 81 MG PO TBEC
81.0000 mg | DELAYED_RELEASE_TABLET | Freq: Every day | ORAL | Status: DC
  Administered 2016-08-16: 81 mg via ORAL
  Filled 2016-08-15: qty 1

## 2016-08-15 MED ORDER — APIXABAN 5 MG PO TABS
5.0000 mg | ORAL_TABLET | Freq: Two times a day (BID) | ORAL | Status: DC
Start: 2016-08-15 — End: 2016-08-16
  Administered 2016-08-15 – 2016-08-16 (×2): 5 mg via ORAL
  Filled 2016-08-15 (×2): qty 1

## 2016-08-15 MED ORDER — OXYCODONE-ACETAMINOPHEN 5-325 MG PO TABS
1.0000 | ORAL_TABLET | Freq: Four times a day (QID) | ORAL | Status: DC | PRN
  Administered 2016-08-15 – 2016-08-16 (×2): 1 via ORAL
  Filled 2016-08-15 (×2): qty 1

## 2016-08-15 MED ORDER — ONDANSETRON HCL 4 MG/2ML IJ SOLN: 4.0000 mg | Freq: Four times a day (QID) | INTRAMUSCULAR | Status: DC | PRN

## 2016-08-15 MED ORDER — PANTOPRAZOLE SODIUM 40 MG PO TBEC
40.0000 mg | DELAYED_RELEASE_TABLET | Freq: Every day | ORAL | Status: DC
  Administered 2016-08-16: 40 mg via ORAL
  Filled 2016-08-15: qty 1

## 2016-08-15 MED ORDER — SODIUM CHLORIDE 0.9% FLUSH
3.0000 mL | Freq: Two times a day (BID) | INTRAVENOUS | Status: DC
  Administered 2016-08-15 – 2016-08-16 (×4): 3 mL via INTRAVENOUS

## 2016-08-15 MED ORDER — SODIUM CHLORIDE 0.9 % IV SOLN
Freq: Once | INTRAVENOUS | Status: AC
  Administered 2016-08-15: 11:00:00 via INTRAVENOUS

## 2016-08-15 MED ORDER — BACLOFEN 20 MG PO TABS
20.0000 mg | ORAL_TABLET | Freq: Three times a day (TID) | ORAL | Status: DC | PRN
  Administered 2016-08-15 – 2016-08-16 (×2): 20 mg via ORAL
  Filled 2016-08-15 (×2): qty 1

## 2016-08-15 MED ORDER — VITAMIN D 1000 UNITS PO TABS
2000.0000 [IU] | ORAL_TABLET | Freq: Every day | ORAL | Status: DC
  Administered 2016-08-16: 2000 [IU] via ORAL
  Filled 2016-08-15: qty 2

## 2016-08-15 MED ORDER — METOPROLOL TARTRATE 50 MG PO TABS
50.0000 mg | ORAL_TABLET | Freq: Two times a day (BID) | ORAL | Status: DC
  Administered 2016-08-15 – 2016-08-16 (×2): 50 mg via ORAL
  Filled 2016-08-15 (×2): qty 1

## 2016-08-15 MED ORDER — ALPRAZOLAM 0.5 MG PO TABS
0.5000 mg | ORAL_TABLET | Freq: Three times a day (TID) | ORAL | Status: DC | PRN
  Administered 2016-08-15 – 2016-08-16 (×2): 0.5 mg via ORAL
  Filled 2016-08-15 (×2): qty 1

## 2016-08-15 MED ORDER — FERROUS SULFATE 325 (65 FE) MG PO TABS
325.0000 mg | ORAL_TABLET | Freq: Two times a day (BID) | ORAL | Status: DC
  Administered 2016-08-16 (×2): 325 mg via ORAL
  Filled 2016-08-15 (×2): qty 1

## 2016-08-15 MED ORDER — ONDANSETRON HCL 4 MG PO TABS: 4.0000 mg | ORAL_TABLET | Freq: Three times a day (TID) | ORAL | Status: DC | PRN

## 2016-08-15 MED ORDER — EZETIMIBE 10 MG PO TABS
10.0000 mg | ORAL_TABLET | Freq: Every day | ORAL | Status: DC
  Administered 2016-08-15 – 2016-08-16 (×2): 10 mg via ORAL
  Filled 2016-08-15 (×2): qty 1

## 2016-08-15 MED ORDER — SODIUM CHLORIDE 0.9 % IV SOLN: INTRAVENOUS | Status: DC

## 2016-08-15 MED ORDER — NALOXONE HCL 2 MG/2ML IJ SOSY
1.0000 mg | PREFILLED_SYRINGE | Freq: Once | INTRAMUSCULAR | Status: AC
  Administered 2016-08-15: 1 mg via INTRAVENOUS
  Filled 2016-08-15: qty 2

## 2016-08-15 MED ORDER — LORATADINE 10 MG PO TABS
10.0000 mg | ORAL_TABLET | Freq: Every day | ORAL | Status: DC
  Administered 2016-08-16: 10 mg via ORAL
  Filled 2016-08-15 (×2): qty 1

## 2016-08-15 MED ORDER — RANOLAZINE ER 500 MG PO TB12
1000.0000 mg | ORAL_TABLET | Freq: Two times a day (BID) | ORAL | Status: DC
  Administered 2016-08-15 – 2016-08-16 (×2): 1000 mg via ORAL
  Filled 2016-08-15 (×3): qty 2

## 2016-08-15 MED ORDER — NITROGLYCERIN 0.4 MG SL SUBL: 0.4000 mg | SUBLINGUAL_TABLET | SUBLINGUAL | Status: DC | PRN

## 2016-08-15 MED ORDER — ACETAMINOPHEN 650 MG RE SUPP: 650.0000 mg | Freq: Four times a day (QID) | RECTAL | Status: DC | PRN

## 2016-08-15 MED ORDER — ISOSORBIDE MONONITRATE ER 60 MG PO TB24
60.0000 mg | ORAL_TABLET | Freq: Every day | ORAL | Status: DC
  Administered 2016-08-16: 60 mg via ORAL
  Filled 2016-08-15: qty 1

## 2016-08-15 NOTE — ED Notes (Signed)
Report attempted 

## 2016-08-15 NOTE — ED Triage Notes (Signed)
Pt arrives from home via [place].  EMS reports pt has had 2 falls in past 24 hours. Lac noted to R hand, brusing and swelling noted to forehead and nose.  Pt follows some commands, oriented x2, lethargic.  Pt takes Eliquis. Recent procedure to R hip noted.

## 2016-08-15 NOTE — ED Notes (Signed)
Pt's wound cleaned with SAF-Clens AF, xeroform (1" X 8") applied and hand was wrapped with 3" X 12' stretch bandage roll, per Dr. Johnney Killian.

## 2016-08-15 NOTE — Progress Notes (Signed)
Patient trasfered from ED to 5W11 via stretcher; alert and oriented x 3; complaints of pain in right hand and right knee; IV saline locked in LFA and RFA. Orient patient to room and unit;  gave patient care guide; instructed how to use the call bell and  fall risk precautions. Will continue to monitor the patient.

## 2016-08-15 NOTE — H&P (Addendum)
History and Physical    Wendy [name] ZOX:[id number] DOB: [birth year] DOA: 08/15/2016  PCP: Wendy Huddle, MD  Patient coming from: Home  Chief Complaint: Altered mental status  HPI: Wendy [name] is a 66 y.o. female with medical history significant of paroxysmal A. fib on Eliquis, recent non-STEMI and right hip replacement. Patient presented to the hospital with altered mental status and somnolence. Most of the history was obtained from the EDP note, I called her aunt, she did not have a lot of information. Reportedly patient fell 2-3 times since last night, she had laceration in her right hand, she has no other bruising, she is on Eliquis so CT scan of the head was done and showed no bleeding. When I interviewed her she is lethargic but able to wake up to verbal stimuli but very short attention span.  ED Course:  Vitals: WNL Labs: WNL Imaging: CXR no finding , CT of the head no acute abnormalities Interventions: referred for observation  Review of Systems:  Unobtainable secondary to altered mental status  Past Medical History:  Diagnosis Date  . Anemia   . Anginal pain (HCC)    jaw pain  . Anxiety   . Arthritis   . CAD (coronary artery disease), native coronary artery    a. Hx CABG 1998, last PCI 2013 b.LHC 07/2013:  EF 55-60%, L-LAD prob atretic, no sig disease in LAD, S-OM1 ok with patent stent, S-dRCA ok => med Rx. c. LHC 01/2015 DES to SVG-RCA.  Marland Kitchen Chronic back pain   . Chronic leg pain   . COPD (chronic obstructive pulmonary disease) ([place])   . GERD (gastroesophageal reflux disease)   . Glucose intolerance (impaired glucose tolerance)   . HLD (hyperlipidemia)   . Hypertension   . Myocardial infarction ([place])   . PAD (peripheral artery disease) ([place])   . PAF (paroxysmal atrial fibrillation) ([place])    a. 09/2013 post-op b. 01/2015  . Peripheral neuropathy   . Shortness of breath    when walking  . Sleep apnea    uses a cpap  . Wears glasses     Past Surgical  History:  Procedure Laterality Date  . CARDIAC CATHETERIZATION N/A 01/22/2015   Procedure: Left Heart Cath and Coronary Angiography;  Surgeon: Wendy Sine, MD;  Location: [facility];  Service: Cardiovascular;  Laterality: N/A;  . CARDIAC CATHETERIZATION N/A 01/22/2015   Procedure: Coronary Stent Intervention;  Surgeon: Wendy Sine, MD;  Location: [facility];  Service: Cardiovascular;  Laterality: N/A;  3.0x12 xience prox SVG to RCA  . CARDIAC SURGERY  1998   LIMA-LAD, SVG-OM, SVG-RCA  . [place] and again several years later  . CHOLECYSTECTOMY  09/17/2013   dr Wendy [name]  . CHOLECYSTECTOMY N/A 09/16/2013   Procedure: LAPAROSCOPIC CHOLECYSTECTOMY CONVERTED TO OPEN CHOLECYSTECTOMY WITH CHOLANGIOGRAM;  Surgeon: Wendy Bowie, MD;  Location: [facility];  Service: General;  Laterality: N/A;  . CORONARY ANGIOPLASTY WITH STENT PLACEMENT  11/2011, 02/2012, 01/2015   DES to SVG-RCA both times, In [place], [place], Dr. Bruce [name]  . ERCP N/A 09/19/2013   Procedure: ENDOSCOPIC RETROGRADE CHOLANGIOPANCREATOGRAPHY (ERCP);  Surgeon: Wendy Castle, MD;  Location: [facility] [facility];  Service: Endoscopy;  Laterality: N/A;  . FINGER SURGERY Right    ring finger  . i and d right subcostal wound  10/17/13  . IRRIGATION AND DEBRIDEMENT ABSCESS Right 10/17/2013   Procedure: INCISION AND DRAINAGE RIGHT SUBCOSTAL WOUND;  Surgeon: Wendy Medal, MD;  Location: [facility];  Service: General;  Laterality: Right;  . KNEE ARTHROSCOPY Right 1998  . LEFT HEART CATH AND CORS/GRAFTS ANGIOGRAPHY N/A 07/25/2016   Procedure: Left Heart Cath and Cors/Grafts Angiography;  Surgeon: Wendy Booze, MD;  Location: [facility];  Service: Cardiovascular;  Laterality: N/A;  . LEFT HEART CATHETERIZATION WITH CORONARY/GRAFT ANGIOGRAM N/A 07/19/2013   Procedure: LEFT HEART CATHETERIZATION WITH Beatrix Fetters;  Surgeon: Wendy Man, MD;  Location: [facility];  Service: Cardiovascular;   Laterality: N/A;  . MASS EXCISION Right 06/25/2014   Procedure: EXCISION BUTTOCK MASS ;  Surgeon: Wendy Keens, MD;  Location: [facility];  Service: General;  Laterality: Right;  . ORIF HUMERUS FRACTURE Left 08/27/2013   Procedure: OPEN REDUCTION INTERNAL FIXATION (ORIF) LEFT HUMERUS ;  Surgeon: Wendy Box, MD;  Location: [place];  Service: Orthopedics;  Laterality: Left;  . TOTAL HIP ARTHROPLASTY Right 07/19/2016   Procedure: TOTAL HIP ARTHROPLASTY ANTERIOR APPROACH;  Surgeon: Wendy Butters, MD;  Location: [facility];  Service: Orthopedics;  Laterality: Right;     reports that she quit smoking about 18 years ago. She has never used smokeless tobacco. She reports that she does not drink alcohol or use drugs.  Allergies  Allergen Reactions  . Benadryl [Diphenhydramine] Shortness Of Breath  . Adhesive [Tape]     Burning of skin  . Amoxicillin Diarrhea    Has patient had a PCN reaction causing immediate rash, facial/tongue/throat swelling, SOB or lightheadedness with hypotension: No Has patient had a PCN reaction causing severe rash involving mucus membranes or skin necrosis: No Has patient had a PCN reaction that required hospitalization No Has patient had a PCN reaction occurring within the last 10 years: Yes If all of the above answers are "NO", then may proceed with Cephalosporin use.   Wendy [name] [Cyclobenzaprine] Other (See Comments)    Causes restless leg syndrome  . Hydrocodone Itching    Family History  Problem Relation Age of Onset  . Lung cancer Mother   . Clotting disorder Father   . Lung cancer Sister   . Heart disease Brother   . Heart disease Brother   . Colon cancer Neg Hx     Prior to Admission medications   Medication Sig Start Date End Date Taking? Authorizing Provider  acetaminophen (TYLENOL) 325 MG tablet Take 2 tablets (650 mg total) by mouth every 4 (four) hours as needed for headache or mild pain. 07/26/16   Wendy Quan, PA-C    albuterol (PROVENTIL HFA;VENTOLIN HFA) 108 (90 BASE) MCG/ACT inhaler Inhale 2 puffs into the lungs every 6 (six) hours as needed for wheezing or shortness of breath.    [provider]  ALPRAZolam Duanne Moron) 0.5 MG tablet Take 0.5 mg by mouth 3 (three) times daily as needed for anxiety.    [provider]  apixaban (ELIQUIS) 5 MG TABS tablet Take 1 tablet (5 mg total) by mouth 2 (two) times daily. 08/04/16   Nahser, Wonda Cheng, MD  aspirin EC 81 MG EC tablet Take 1 tablet (81 mg total) by mouth daily. 07/27/16   Wendy Quan, PA-C  atorvastatin (LIPITOR) 80 MG tablet TAKE 1 TABLET (80 MG TOTAL) BY MOUTH DAILY AT 6 PM. 10/05/15   Strader, [place] M, PA-C  baclofen (LIORESAL) 20 MG tablet Take 20 mg by mouth 3 (three) times daily as needed for muscle spasms.    [provider]  cetirizine (ZYRTEC) 10 MG tablet Take 10  mg by mouth daily as needed for allergies.     [provider]  cholecalciferol (VITAMIN D) 1000 units tablet Take 2,000 Units by mouth daily.    [provider]  clopidogrel (PLAVIX) 75 MG tablet Take 75 mg by mouth daily.    [provider]  diclofenac sodium (VOLTAREN) 1 % GEL Apply 1 g topically daily as needed (pain).    [provider]  ezetimibe (ZETIA) 10 MG tablet Take 10 mg by mouth daily.    [provider]  Fish Oil-Cholecalciferol (FISH OIL + D3 PO) Take 1 capsule by mouth daily. 1200 mg fish oil 600 mg Omega 3 2000 IU Vitamin D    [provider]  furosemide (LASIX) 20 MG tablet Take 1 tablet (20 mg total) by mouth daily. 07/26/16 07/26/17  Wendy Quan, PA-C  isosorbide mononitrate (IMDUR) 60 MG 24 hr tablet Take 1 tablet (60 mg total) by mouth every morning. 07/26/16 07/26/17  Wendy Quan, PA-C  metoprolol (LOPRESSOR) 50 MG tablet Take 1 tablet (50 mg total) by mouth 2 (two) times daily. 06/24/16   Nahser, Wonda Cheng, MD  nitroGLYCERIN (NITROSTAT) 0.4 MG SL tablet Place 0.4 mg under the tongue  every 5 (five) minutes as needed for chest pain.    [provider]  ondansetron (ZOFRAN) 4 MG tablet Take 1 tablet (4 mg total) by mouth every 8 (eight) hours as needed for nausea or vomiting. 07/19/16   Prudencio Burly III, PA-C  oxyCODONE-acetaminophen (ROXICET) 5-325 MG tablet Take 1-2 tablets by mouth every 4 (four) hours as needed for severe pain. 07/19/16   Prudencio Burly III, PA-C  pantoprazole (PROTONIX) 40 MG tablet Take 1 tablet (40 mg total) by mouth daily. 07/27/16   Wendy Quan, PA-C  pramipexole (MIRAPEX) 0.25 MG tablet Take 0.25 mg by mouth at bedtime as needed.    [provider]  ranolazine (RANEXA) 1000 MG SR tablet Take 1,000 mg by mouth 2 (two) times daily.    [provider]  sertraline (ZOLOFT) 100 MG tablet Take 150 mg by mouth daily. Takes 1.5 tablets daily    [provider]    Physical Exam:  Vitals:   08/15/16 1200 08/15/16 1300 08/15/16 1400 08/15/16 1500  BP: 130/85 (!) 159/87 (!) 142/86 (!) 143/87  Pulse: (!) 113 (!) 105 (!) 116 (!) 116  Resp: (!) 21 (!) 22 (!) 26 12  Temp:      TempSrc:      SpO2: 93% 97% 93% 97%  Weight:      Height:        Constitutional: NAD, calm, comfortable Eyes: PERRL, lids and conjunctivae normal ENMT: Mucous membranes are moist. Posterior pharynx clear of any exudate or lesions.Normal dentition.  Neck: normal, supple, no masses, no thyromegaly Respiratory: clear to auscultation bilaterally, no wheezing, no crackles. Normal respiratory effort. No accessory muscle use.  Cardiovascular: Regular rate and rhythm, no murmurs / rubs / gallops. No extremity edema. 2+ pedal pulses. No carotid bruits.  Abdomen: no tenderness, no masses palpated. No hepatosplenomegaly. Bowel sounds positive.  Musculoskeletal: no clubbing / cyanosis. No joint deformity upper and lower extremities. Good ROM, no contractures. Normal muscle tone.  Skin: no rashes, lesions, ulcers. No induration Neurologic:  CN 2-12 grossly intact. Sensation intact, DTR normal. Strength 5/5 in all 4.  Psychiatric: Normal judgment and insight. Alert and oriented x 3. Normal mood.   Labs on Admission: I have personally reviewed following labs and imaging  studies  CBC:  Recent Labs Lab 08/15/16 1004  WBC 6.8  HGB 9.7*  HCT 32.0*  MCV 90.9  PLT 408   Basic Metabolic Panel:  Recent Labs Lab 08/15/16 1004  NA 144  K 3.4*  CL 108  CO2 26  GLUCOSE 127*  BUN 20  CREATININE 0.91  CALCIUM 9.3   GFR: Estimated Creatinine Clearance: 60.4 mL/min (by C-G formula based on SCr of 0.91 mg/dL). Liver Function Tests:  Recent Labs Lab 08/15/16 1004  AST 16  ALT 10*  ALKPHOS 79  BILITOT 0.5  PROT 7.0  ALBUMIN 3.0*   No results for input(s): LIPASE, AMYLASE in the last 168 hours.  Recent Labs Lab 08/15/16 1024  AMMONIA 19   Coagulation Profile:  Recent Labs Lab 08/15/16 1019  INR 1.63   Cardiac Enzymes: No results for input(s): CKTOTAL, CKMB, CKMBINDEX, TROPONINI in the last 168 hours. BNP (last 3 results) No results for input(s): PROBNP in the last 8760 hours. HbA1C: No results for input(s): HGBA1C in the last 72 hours. CBG: No results for input(s): GLUCAP in the last 168 hours. Lipid Profile: No results for input(s): CHOL, HDL, LDLCALC, TRIG, CHOLHDL, LDLDIRECT in the last 72 hours. Thyroid Function Tests: No results for input(s): TSH, T4TOTAL, FREET4, T3FREE, THYROIDAB in the last 72 hours. Anemia Panel: No results for input(s): VITAMINB12, FOLATE, FERRITIN, TIBC, IRON, RETICCTPCT in the last 72 hours. Urine analysis:    Component Value Date/Time   COLORURINE YELLOW 08/15/2016 [place] 08/15/2016 1233   LABSPEC 1.016 08/15/2016 1233   PHURINE 6.0 08/15/2016 1233   GLUCOSEU NEGATIVE 08/15/2016 1233   HGBUR NEGATIVE 08/15/2016 [place] 08/15/2016 1233   KETONESUR NEGATIVE 08/15/2016 1233   PROTEINUR NEGATIVE 08/15/2016 1233   UROBILINOGEN  1.0 09/24/2013 1951   NITRITE NEGATIVE 08/15/2016 1233   LEUKOCYTESUR NEGATIVE 08/15/2016 1233   Sepsis Labs: !!!!!!!!!!!!!!!!!!!!!!!!!!!!!!!!!!!!!!!!!!!! Invalid input(s): PROCALCITONIN, LACTICIDVEN No results found for this or any previous visit (from the past 240 hour(s)).   Radiological Exams on Admission: Ct Head Wo Contrast  Result Date: 08/15/2016 CLINICAL DATA:  Mental status changes, lethargy and 2 separate falls within the last 24 hours with forehead hematoma. Initial encounter. EXAM: CT HEAD WITHOUT CONTRAST CT CERVICAL SPINE WITHOUT CONTRAST TECHNIQUE: Multidetector CT imaging of the head and cervical spine was performed following the standard protocol without intravenous contrast. Multiplanar CT image reconstructions of the cervical spine were also generated. COMPARISON:  None. FINDINGS: CT HEAD FINDINGS Brain: Low density lacunar infarct within the inferior aspect of the left basal ganglia is likely old in age based on appearance. No evidence of acute infarction, hemorrhage, hydrocephalus, extra-axial collection or mass lesion/mass effect. Vascular: No hyperdense vessel or unexpected calcification. Skull: Normal. Negative for fracture or focal lesion. Sinuses/Orbits: No acute finding. Other: Mild frontal scalp soft tissue swelling. CT CERVICAL SPINE FINDINGS Alignment: Normal alignment without evidence of subluxation. Skull base and vertebrae: No evidence of acute fracture. No bony lesions identified. Soft tissues and spinal canal: No prevertebral fluid or swelling. No visible canal hematoma. Disc levels: The C5 and C6 vertebral bodies are nearly completely fused. There is moderate disc space narrowing at C6-7. Milder degenerative disc disease is present at C2-3, C3-4, C4-5 and C7-T1. Upper chest: Negative. 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 IMPRESSION: 1. 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 No acute findings by head CT. 2. No evidence of  acute cervical injury. Diffuse cervical degenerative disc disease present with nearly completely fused C5 and C6 vertebral bodies. 3. 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 Consider elective evaluation with thyroid ultrasound and correlation with thyroid function tests. Electronically Signed   By: Aletta Edouard M.D.   On: 08/15/2016 11:28   Ct Cervical Spine Wo Contrast  Result Date: 08/15/2016 CLINICAL DATA:  Mental status changes, lethargy and 2 separate falls within the last 24 hours with forehead hematoma. Initial encounter. EXAM: CT HEAD WITHOUT CONTRAST CT CERVICAL SPINE WITHOUT CONTRAST TECHNIQUE: Multidetector CT imaging of the head and cervical spine was performed following the standard protocol without intravenous contrast. Multiplanar CT image reconstructions of the cervical spine were also generated. COMPARISON:  None. FINDINGS: CT HEAD FINDINGS Brain: Low density lacunar infarct within the inferior aspect of the left basal ganglia is likely old in age based on appearance. No evidence of acute infarction, hemorrhage, hydrocephalus, extra-axial collection or mass lesion/mass effect. Vascular: No hyperdense vessel or unexpected calcification. Skull: Normal. Negative for fracture or focal lesion. Sinuses/Orbits: No acute finding. Other: Mild frontal scalp soft tissue swelling. CT CERVICAL SPINE FINDINGS Alignment: Normal alignment without evidence of subluxation. Skull base and vertebrae: No evidence of acute fracture. No bony lesions identified. Soft tissues and spinal canal: No prevertebral fluid or swelling. No visible canal hematoma. Disc levels: The C5 and C6 vertebral bodies are nearly completely fused. There is moderate disc space narrowing at C6-7. Milder degenerative disc disease is present at C2-3, C3-4, C4-5 and C7-T1. Upper chest: Negative. Other: There is lobular multinodular appearance of the  thyroid gland with some scattered dystrophic appearing calcifications. IMPRESSION: 1. 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 No acute findings by head CT. 2. No evidence of acute cervical injury. Diffuse cervical degenerative disc disease present with nearly completely fused C5 and C6 vertebral bodies. 3. 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 Consider elective evaluation with thyroid ultrasound and correlation with thyroid function tests. Electronically Signed   By: Aletta Edouard M.D.   On: 08/15/2016 11:28   Dg Chest Port 1 View  Result Date: 08/15/2016 CLINICAL DATA:  Patient with mental status changes. Status post fall. EXAM: PORTABLE CHEST 1 VIEW COMPARISON:  Chest radiograph 07/22/2016 FINDINGS: Multiple monitoring leads overlie the patient. Patient status post median sternotomy and CABG procedure. Stable cardiac and mediastinal contours. Elevation right hemidiaphragm. No consolidative pulmonary opacities. No pleural effusion or pneumothorax. IMPRESSION: No acute cardiopulmonary process. Electronically Signed   By: Lovey Newcomer M.D.   On: 08/15/2016 10:43   Dg Hand Complete Right  Result Date: 08/15/2016 CLINICAL DATA:  Bruising and swelling following laceration at third and fourth metacarpal regions EXAM: RIGHT HAND - COMPLETE 3+ VIEW COMPARISON:  September 24, 2005. FINDINGS: Frontal, oblique, and lateral views were obtained. There is a a screw transfixing the fourth DIP joint. There is partial ankylosis of the fourth DIP joint. There is soft tissue swelling medially and dorsally, marked. There is no radiopaque foreign body or soft tissue air in this area. There is no acute fracture or dislocation. There is moderate osteoarthritic change in all PIP and DIP joints. No erosive change or bony destruction. IMPRESSION: Marked soft tissue swelling along the dorsal, medial aspect of the hand. No radiopaque foreign body or soft tissue air. No bony destruction or  erosion. 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 No fracture or dislocation. Electronically Signed   By: Lowella Grip III M.D.  On: 08/15/2016 11:37    EKG: Independently reviewed.   Assessment/Plan Principal Problem:   Altered mental state Active Problems:   Hx of CABG   COPD (chronic obstructive pulmonary disease) (HCC)   Chronic back pain   PAF (paroxysmal atrial fibrillation) (HCC)   Chronic anticoagulation    Altered mental status -Acute metabolic encephalopathy of unclear reasons. -CT scan of the head without acute findings. -ABG did not show hypercapnia or significant hypoxia. Ammonia is negative. -Urine tox is positive for benzos, cocaine and THC. -Patient takes Xanax, Percocet and baclofen, patient denies any drug overdose or taking extra medications. -Patient herself for which she had a seizure, she reported remote history of one episode of seizure. -They'll obtain MRI of the brain, to rule out acute findings like CVA. -I think this is likely secondary to medications and illicit drug use, will observe overnight.  Paroxysmal atrial fibrillation -CHA2DS2-VASc is at least 3, she has an Eliquis, continued. -Rate is controlled she is on metoprolol 50 mg twice a day continued.  Chronic systolic CHF -There is discrepancy between the recent 2-D echo on 07/23/2016 and the heart cath on 07/25/2016 -The heart catheter showed ejection fraction of 25-35% and the 2-D echo showed LVEF of 6065%. -Patient is on metoprolol, Lasix and Imdur all continued. She does not have any symptoms of decompensation.  COPD -No symptoms suggest decompensation, continue home medications.  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 Patient admitted for using marijuana but denies cocaine use. -Interestingly enough, patient is on Percocet and the UDS is negative for  opiates.    DVT prophylaxis: SQ Heparin Code Status: Full Code Family Communication: Plan D/W patient Disposition Plan: Home Consults called:  Admission status: observation   [facility] A MD [facility] Pager [phone]  If 7PM-7AM, please contact night-coverage [email or web address] Password [facility]  08/15/2016, 4:24 PM

## 2016-08-15 NOTE — ED Notes (Signed)
Patient transported to CT 

## 2016-08-15 NOTE — ED Provider Notes (Addendum)
[facility] DEPT Provider Note   CSN: [id number] Arrival date & time: 08/15/16  4332     History   Chief Complaint Chief Complaint  Patient presents with  . Altered Mental Status    HPI Wendy [name] is a 66 y.o. female.  HPI   Patient had total hip replacement [phone]. She had subsequent NSTEMI and underwent cardiac catheterization 07/25/2016 and was discharged on Eliquis. Patient is an extremely limited historian at this time. Currently there is no family member present for additional history. Patient is very lethargic. With stimulus she answers simple questions and no helpful history at this time. She is currently denying pain and endorsing feeling sleepy. Patient has obvious for head contusion and right hand contusion. Past Medical History:  Diagnosis Date  . Anemia   . Anginal pain (HCC)    jaw pain  . Anxiety   . Arthritis   . CAD (coronary artery disease), native coronary artery    a. Hx CABG 1998, last PCI 2013 b.LHC 07/2013:  EF 55-60%, L-LAD prob atretic, no sig disease in LAD, S-OM1 ok with patent stent, S-dRCA ok => med Rx. c. LHC 01/2015 DES to SVG-RCA.  Marland Kitchen Chronic back pain   . Chronic leg pain   . COPD (chronic obstructive pulmonary disease) ([place])   . GERD (gastroesophageal reflux disease)   . Glucose intolerance (impaired glucose tolerance)   . HLD (hyperlipidemia)   . Hypertension   . Myocardial infarction ([place])   . PAD (peripheral artery disease) ([place])   . PAF (paroxysmal atrial fibrillation) ([place])    a. 09/2013 post-op b. 01/2015  . Peripheral neuropathy   . Shortness of breath    when walking  . Sleep apnea    uses a cpap  . Wears glasses     Patient Active Problem List   Diagnosis Date Noted  . Altered mental state 08/15/2016  . Acute combined systolic and diastolic heart failure ([place]) 07/26/2016  . Troponin level elevated 07/26/2016  . Chronic anticoagulation 07/22/2016  . CAD S/P multiple PCIs-last one 2016 07/22/2016  . NSTEMI  (non-ST elevated myocardial infarction) ([place]) 07/22/2016  . Primary osteoarthritis of right hip 06/27/2016  . Avascular necrosis of hip, right ([place]) 06/27/2016  . PVD (peripheral vascular disease) ([place])   . PAF (paroxysmal atrial fibrillation) ([place]) 01/21/2015  . Essential (primary) hypertension 01/14/2015  . Arteriosclerosis of coronary artery 10/06/2014  . Acute angina ([place]) 05/12/2014  . Epigastric pain   . Chest pain with high risk of acute coronary syndrome 10/21/2013  . Wound infection after surgery 10/17/2013  . Acute respiratory failure ([place]) 09/20/2013  . Nonspecific abnormal results of liver function study 09/17/2013  . Cholecystitis, acute 09/15/2013  . PAD (peripheral artery disease) ([place])   . COPD (chronic obstructive pulmonary disease) ([place])   . Peripheral neuropathy   . Chronic back pain   . Sleep apnea   . Anxiety   . GERD (gastroesophageal reflux disease)   . 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 Hypertension 07/20/2013  . HLD (hyperlipidemia) 07/20/2013  . Right groin wound 07/20/2013  . Unstable angina ([place]) 07/19/2013  . Hx of CABG 07/19/2013    Past Surgical History:  Procedure Laterality Date  . CARDIAC CATHETERIZATION N/A 01/22/2015   Procedure: Left Heart Cath and Coronary Angiography;  Surgeon: Troy Sine, MD;  Location: [facility];  Service: Cardiovascular;  Laterality: N/A;  . CARDIAC CATHETERIZATION N/A 01/22/2015   Procedure: Coronary Stent Intervention;  Surgeon: Troy Sine, MD;  Location: [facility];  Service: Cardiovascular;  Laterality: N/A;  3.0x12 xience prox SVG to RCA  . CARDIAC SURGERY  1998   LIMA-LAD, SVG-OM, SVG-RCA  . [place] and again several years later  . CHOLECYSTECTOMY  09/17/2013   dr [name]  . CHOLECYSTECTOMY N/A 09/16/2013   Procedure: LAPAROSCOPIC CHOLECYSTECTOMY CONVERTED TO OPEN CHOLECYSTECTOMY WITH CHOLANGIOGRAM;  Surgeon: [name], MD;  Location: [facility];  Service: General;   Laterality: N/A;  . CORONARY ANGIOPLASTY WITH STENT PLACEMENT  11/2011, 02/2012, 01/2015   DES to SVG-RCA both times, In [place], [place], Dr. Bruce Donath  . ERCP N/A 09/19/2013   Procedure: ENDOSCOPIC RETROGRADE CHOLANGIOPANCREATOGRAPHY (ERCP);  Surgeon: [name], MD;  Location: [facility];  Service: Endoscopy;  Laterality: N/A;  . FINGER SURGERY Right    ring finger  . i and d right subcostal wound  10/17/13  . IRRIGATION AND DEBRIDEMENT ABSCESS Right 10/17/2013   Procedure: INCISION AND DRAINAGE RIGHT SUBCOSTAL WOUND;  Surgeon: [name], MD;  Location: [facility];  Service: General;  Laterality: Right;  . KNEE ARTHROSCOPY Right 1998  . LEFT HEART CATH AND CORS/GRAFTS ANGIOGRAPHY N/A 07/25/2016   Procedure: Left Heart Cath and Cors/Grafts Angiography;  Surgeon: Jettie Booze, MD;  Location: [facility];  Service: Cardiovascular;  Laterality: N/A;  . LEFT HEART CATHETERIZATION WITH CORONARY/GRAFT ANGIOGRAM N/A 07/19/2013   Procedure: LEFT HEART CATHETERIZATION WITH Beatrix Fetters;  Surgeon: Leonie Man, MD;  Location: [facility] C[facility];  Service: Cardiovascular;  Laterality: N/A;  . MASS EXCISION Right 06/25/2014   Procedure: EXCISION BUTTOCK MASS ;  Surgeon: Coralie Keens, MD;  Location: [facility];  Service: General;  Laterality: Right;  . ORIF HUMERUS FRACTURE Left 08/27/2013   Procedure: OPEN REDUCTION INTERNAL FIXATION (ORIF) LEFT HUMERUS ;  Surgeon: Rozanna Box, MD;  Location: [facility];  Service: Orthopedics;  Laterality: Left;  . TOTAL HIP ARTHROPLASTY Right 07/19/2016   Procedure: TOTAL HIP ARTHROPLASTY ANTERIOR APPROACH;  Surgeon: [name], MD;  Location: [facility];  Service: Orthopedics;  Laterality: Right;    OB History    No data available       Home Medications    Prior to Admission medications   Medication Sig Start Date End Date Taking? Authorizing Provider  acetaminophen (TYLENOL) 325 MG tablet Take 2 tablets (650 mg total)  by mouth every 4 (four) hours as needed for headache or mild pain. 07/26/16   Erlene Quan, PA-C  albuterol (PROVENTIL HFA;VENTOLIN HFA) 108 (90 BASE) MCG/ACT inhaler Inhale 2 puffs into the lungs every 6 (six) hours as needed for wheezing or shortness of breath.    [provider]  ALPRAZolam Duanne Moron) 0.5 MG tablet Take 0.5 mg by mouth 3 (three) times daily as needed for anxiety.    [provider]  apixaban (ELIQUIS) 5 MG TABS tablet Take 1 tablet (5 mg total) by mouth 2 (two) times daily. 08/04/16   Nahser, Wonda Cheng, MD  aspirin EC 81 MG EC tablet Take 1 tablet (81 mg total) by mouth daily. 07/27/16   Erlene Quan, PA-C  atorvastatin (LIPITOR) 80 MG tablet TAKE 1 TABLET (80 MG TOTAL) BY MOUTH DAILY AT 6 PM. 10/05/15   Strader, [place] M, PA-C  baclofen (LIORESAL) 20 MG tablet Take 20 mg by mouth 3 (three) times daily as needed for muscle spasms.    [provider]  cetirizine (ZYRTEC) 10 MG tablet Take 10 mg by  mouth daily as needed for allergies.     [provider]  cholecalciferol (VITAMIN D) 1000 units tablet Take 2,000 Units by mouth daily.    [provider]  clopidogrel (PLAVIX) 75 MG tablet Take 75 mg by mouth daily.    [provider]  diclofenac sodium (VOLTAREN) 1 % GEL Apply 1 g topically daily as needed (pain).    [provider]  ezetimibe (ZETIA) 10 MG tablet Take 10 mg by mouth daily.    [provider]  Fish Oil-Cholecalciferol (FISH OIL + D3 PO) Take 1 capsule by mouth daily. 1200 mg fish oil 600 mg Omega 3 2000 IU Vitamin D    [provider]  furosemide (LASIX) 20 MG tablet Take 1 tablet (20 mg total) by mouth daily. 07/26/16 07/26/17  Erlene Quan, PA-C  isosorbide mononitrate (IMDUR) 60 MG 24 hr tablet Take 1 tablet (60 mg total) by mouth every morning. 07/26/16 07/26/17  Erlene Quan, PA-C  metoprolol (LOPRESSOR) 50 MG tablet Take 1 tablet (50 mg total) by mouth 2 (two) times daily. 06/24/16    Nahser, Wonda Cheng, MD  nitroGLYCERIN (NITROSTAT) 0.4 MG SL tablet Place 0.4 mg under the tongue every 5 (five) minutes as needed for chest pain.    [provider]  ondansetron (ZOFRAN) 4 MG tablet Take 1 tablet (4 mg total) by mouth every 8 (eight) hours as needed for nausea or vomiting. 07/19/16   Prudencio Burly III, PA-C  oxyCODONE-acetaminophen (ROXICET) 5-325 MG tablet Take 1-2 tablets by mouth every 4 (four) hours as needed for severe pain. 07/19/16   Prudencio Burly III, PA-C  pantoprazole (PROTONIX) 40 MG tablet Take 1 tablet (40 mg total) by mouth daily. 07/27/16   Erlene Quan, PA-C  pramipexole (MIRAPEX) 0.25 MG tablet Take 0.25 mg by mouth at bedtime as needed.    [provider]  ranolazine (RANEXA) 1000 MG SR tablet Take 1,000 mg by mouth 2 (two) times daily.    [provider]  sertraline (ZOLOFT) 100 MG tablet Take 150 mg by mouth daily. Takes 1.5 tablets daily    [provider]    Family History Family History  Problem Relation Age of Onset  . Lung cancer Mother   . Clotting disorder Father   . Lung cancer Sister   . Heart disease Brother   . Heart disease Brother   . Colon cancer Neg Hx     Social History Social History  Substance Use Topics  . Smoking status: Former Smoker    Quit date: 07/20/1998  . Smokeless tobacco: Never Used  . Alcohol use No     Allergies   Benadryl [diphenhydramine]; Adhesive [tape]; Amoxicillin; Flexeril [cyclobenzaprine]; and Hydrocodone   Review of Systems Review of Systems Cannot obtain review of systems ,Level V caveat somnolence and confusion   Physical Exam Updated Vital Signs BP (!) 143/87   Pulse (!) 116   Temp 98.6 F (37 C) (Oral)   Resp 12   Ht 5\' 1"  (1.549 m)   Wt 81.6 kg (180 lb)   SpO2 97%   BMI 34.01 kg/m   Physical Exam  Constitutional:  Patient is very somnolent. She only awakens to tactile stimulus quickly falls back asleep. No apparent pain distress.  Respirations are regular and deep. No respiratory distress. Skin is warm.  HENT:  2-1/2 cm forhead hematoma. No laceration.  ecchymotic bruising at the bridge of the nose without large swelling. No blood in  the nares. Blood in the mouth. Mucous membranes are dry. Dentition is intact.  Eyes: EOM are normal. Pupils are equal, round, and reactive to light.  Neck:  Anterior soft neck tissues are supple. No step-off.  Cardiovascular:  Irregularly irregular, rate controlled. Distal pulses intact.  Pulmonary/Chest: Effort normal and breath sounds normal.  Patient does not follow instructions for deep inspiration. No gross wheezes rhonchi rale.  Abdominal: Soft. Bowel sounds are normal. She exhibits no distension. There is no tenderness. There is no guarding.  Approximately 8 cm hematoma to lower abdominal wall, elliptical and suggestive of an injection site.  Musculoskeletal:  Right hand has large swelling and contusion of the dorsum. There is a 1 cm skin tear on the dorsal hand without active bleeding. Patient does have a ring on the ring finger. No apparent lower extremity wounds. No significant peripheral edema. Patient has a well-healing vertical incision over her inguinal back/upper thigh. No drainage, discharge or erythema.  Neurological:  Patient is very somnolent and only awakens to brisk tactile stimulus. She is confused to her surroundings. She does not follow commands for extremity strength testing.  Skin: Skin is warm and dry.     ED Treatments / Results  Labs (all labs ordered are listed, but only abnormal results are displayed) Labs Reviewed  COMPREHENSIVE METABOLIC PANEL - Abnormal; Notable for the following:       Result Value   Potassium 3.4 (*)    Glucose, Bld 127 (*)    Albumin 3.0 (*)    ALT 10 (*)    All other components within normal limits  CBC - Abnormal; Notable for the following:    RBC 3.52 (*)    Hemoglobin 9.7 (*)    HCT 32.0 (*)    All other components  within normal limits  ACETAMINOPHEN LEVEL - Abnormal; Notable for the following:    Acetaminophen (Tylenol), Serum <10 (*)    All other components within normal limits  PROTIME-INR - Abnormal; Notable for the following:    Prothrombin Time 19.5 (*)    All other components within normal limits  RAPID URINE DRUG SCREEN, HOSP PERFORMED - Abnormal; Notable for the following:    Cocaine POSITIVE (*)    Benzodiazepines POSITIVE (*)    Tetrahydrocannabinol POSITIVE (*)    All other components within normal limits  I-STAT ARTERIAL BLOOD GAS, ED - Abnormal; Notable for the following:    pO2, Arterial 70.0 (*)    All other components within normal limits  ETHANOL  URINALYSIS, ROUTINE W REFLEX MICROSCOPIC  AMMONIA  BLOOD GAS, ARTERIAL  CBG MONITORING, ED  I-STAT CG4 LACTIC ACID, ED  I-STAT TROPOININ, ED    EKG  EKG Interpretation None       Radiology Ct Head Wo Contrast  Result Date: 08/15/2016 CLINICAL DATA:  Mental status changes, lethargy and 2 separate falls within the last 24 hours with forehead hematoma. Initial encounter. EXAM: CT HEAD WITHOUT CONTRAST CT CERVICAL SPINE WITHOUT CONTRAST TECHNIQUE: Multidetector CT imaging of the head and cervical spine was performed following the standard protocol without intravenous contrast. Multiplanar CT image reconstructions of the cervical spine were also generated. COMPARISON:  None. FINDINGS: CT HEAD FINDINGS Brain: Low density lacunar infarct within the inferior aspect of the left basal ganglia is likely old in age based on appearance. No evidence of acute infarction, hemorrhage, hydrocephalus, extra-axial collection or mass lesion/mass effect. Vascular: No hyperdense vessel or unexpected calcification. Skull: Normal. Negative for fracture or focal lesion. Sinuses/Orbits: No  acute finding. Other: Mild frontal scalp soft tissue swelling. CT CERVICAL SPINE FINDINGS Alignment: Normal alignment without evidence of subluxation. Skull base and  vertebrae: No evidence of acute fracture. No bony lesions identified. Soft tissues and spinal canal: No prevertebral fluid or swelling. No visible canal hematoma. Disc levels: The C5 and C6 vertebral bodies are nearly completely fused. There is moderate disc space narrowing at C6-7. Milder degenerative disc disease is present at C2-3, C3-4, C4-5 and C7-T1. Upper chest: Negative. 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 IMPRESSION: 1. 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 No acute findings by head CT. 2. No evidence of acute cervical injury. Diffuse cervical degenerative disc disease present with nearly completely fused C5 and C6 vertebral bodies. 3. 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 Consider elective evaluation with thyroid ultrasound and correlation with thyroid function tests. Electronically Signed   By: Aletta Edouard M.D.   On: 08/15/2016 11:28   Ct Cervical Spine Wo Contrast  Result Date: 08/15/2016 CLINICAL DATA:  Mental status changes, lethargy and 2 separate falls within the last 24 hours with forehead hematoma. Initial encounter. EXAM: CT HEAD WITHOUT CONTRAST CT CERVICAL SPINE WITHOUT CONTRAST TECHNIQUE: Multidetector CT imaging of the head and cervical spine was performed following the standard protocol without intravenous contrast. Multiplanar CT image reconstructions of the cervical spine were also generated. COMPARISON:  None. FINDINGS: CT HEAD FINDINGS Brain: Low density lacunar infarct within the inferior aspect of the left basal ganglia is likely old in age based on appearance. No evidence of acute infarction, hemorrhage, hydrocephalus, extra-axial collection or mass lesion/mass effect. Vascular: No hyperdense vessel or unexpected calcification. Skull: Normal. Negative for fracture or focal lesion. Sinuses/Orbits: No acute finding. Other: Mild frontal  scalp soft tissue swelling. CT CERVICAL SPINE FINDINGS Alignment: Normal alignment without evidence of subluxation. Skull base and vertebrae: No evidence of acute fracture. No bony lesions identified. Soft tissues and spinal canal: No prevertebral fluid or swelling. No visible canal hematoma. Disc levels: The C5 and C6 vertebral bodies are nearly completely fused. There is moderate disc space narrowing at C6-7. Milder degenerative disc disease is present at C2-3, C3-4, C4-5 and C7-T1. Upper chest: Negative. 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 IMPRESSION: 1. 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 No acute findings by head CT. 2. No evidence of acute cervical injury. Diffuse cervical degenerative disc disease present with nearly completely fused C5 and C6 vertebral bodies. 3. 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 Consider elective evaluation with thyroid ultrasound and correlation with thyroid function tests. Electronically Signed   By: Aletta Edouard M.D.   On: 08/15/2016 11:28   Dg Chest Port 1 View  Result Date: 08/15/2016 CLINICAL DATA:  Patient with mental status changes. Status post fall. EXAM: PORTABLE CHEST 1 VIEW COMPARISON:  Chest radiograph 07/22/2016 FINDINGS: Multiple monitoring leads overlie the patient. Patient status post median sternotomy and CABG procedure. Stable cardiac and mediastinal contours. Elevation right hemidiaphragm. No consolidative pulmonary opacities. No pleural effusion or pneumothorax. IMPRESSION: No acute cardiopulmonary process. Electronically Signed   By: Lovey Newcomer M.D.   On: 08/15/2016 10:43   Dg Hand Complete Right  Result Date: 08/15/2016 CLINICAL DATA:  Bruising and swelling following laceration at third and fourth metacarpal regions EXAM: RIGHT HAND - COMPLETE 3+ VIEW COMPARISON:  September 24, 2005. FINDINGS: Frontal,  oblique, and  lateral views were obtained. There is a a screw transfixing the fourth DIP joint. There is partial ankylosis of the fourth DIP joint. There is soft tissue swelling medially and dorsally, marked. There is no radiopaque foreign body or soft tissue air in this area. There is no acute fracture or dislocation. There is moderate osteoarthritic change in all PIP and DIP joints. No erosive change or bony destruction. IMPRESSION: Marked soft tissue swelling along the dorsal, medial aspect of the hand. No radiopaque foreign body or soft tissue air. No bony destruction or erosion. 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 No fracture or dislocation. Electronically Signed   By: Lowella Grip III M.D.   On: 08/15/2016 11:37    Procedures Procedures (including critical care time) CRITICAL CARE Performed by: Charlesetta Shanks   Total critical care time: 30 minutes  Critical care time was exclusive of separately billable procedures and treating other patients.  Critical care was necessary to treat or prevent imminent or life-threatening deterioration.  Critical care was time spent personally by me on the following activities: development of treatment plan with patient and/or surrogate as well as nursing, discussions with consultants, evaluation of patient's response to treatment, examination of patient, obtaining history from patient or surrogate, ordering and performing treatments and interventions, ordering and review of laboratory studies, ordering and review of radiographic studies, pulse oximetry and re-evaluation of patient's condition. Medications Ordered in ED Medications  0.9 %  sodium chloride infusion ( Intravenous Stopped 08/15/16 1535)  naloxone (NARCAN) injection 1 mg (1 mg Intravenous Given 08/15/16 1200)     Initial Impression / Assessment and Plan / ED Course  I have reviewed the triage vital signs and the nursing notes.  Pertinent labs & imaging results that were available during  my care of the patient were reviewed by me and considered in my medical decision making (see chart for details).   After a period of observation, patient became more alert and aware of her surroundings. When asked about her positive drug screen, she does endorse marijuana use periodically and maybe Xanax. She denies use of cocaine which is positive on screen. She now thinks she probably had a seizure causing her to fall and be so confused and sleepy.  Final Clinical Impressions(s) / ED Diagnoses   Final diagnoses:  Somnolence  Confusion  Hematoma  Severe comorbid illness  Polysubstance abuse  Tachycardia   Patient rises mental status change. She is very lethargic on arrival. CT head does not hurt any intracranial bleeding. She is on Eliquis and does have areas of bruising with the forehead hematoma and hematoma of the posterior right hand. She appears to fallen. He does have multiple severe comorbid medical conditions of an STEMI. At this time, plan will be for observation to continue rule out for MI and observe for resolution of lethargy and mental status change. It appears this may be due to polysubstance use\abuse. Further evaluation and counseling may be indicated. New Prescriptions New Prescriptions   No medications on file     Charlesetta Shanks, MD 08/15/16 1541    Charlesetta Shanks, MD 08/15/16 [phone]

## 2016-08-16 ENCOUNTER — Encounter (HOSPITAL_COMMUNITY): Payer: Self-pay | Admitting: General Practice

## 2016-08-16 DIAGNOSIS — G9341 Metabolic encephalopathy: Secondary | ICD-10-CM | POA: Diagnosis not present

## 2016-08-16 DIAGNOSIS — Z951 Presence of aortocoronary bypass graft: Secondary | ICD-10-CM

## 2016-08-16 DIAGNOSIS — I480 Paroxysmal atrial fibrillation: Secondary | ICD-10-CM

## 2016-08-16 DIAGNOSIS — F1910 Other psychoactive substance abuse, uncomplicated: Secondary | ICD-10-CM

## 2016-08-16 DIAGNOSIS — W19XXXD Unspecified fall, subsequent encounter: Secondary | ICD-10-CM | POA: Diagnosis not present

## 2016-08-16 DIAGNOSIS — R410 Disorientation, unspecified: Secondary | ICD-10-CM | POA: Diagnosis not present

## 2016-08-16 DIAGNOSIS — R400 Somnolence: Secondary | ICD-10-CM

## 2016-08-16 LAB — BASIC METABOLIC PANEL
Anion gap: 9 (ref 5–15)
BUN: 13 mg/dL (ref 6–20)
CO2: 28 mmol/L (ref 22–32)
Calcium: 8.8 mg/dL — ABNORMAL LOW (ref 8.9–10.3)
Chloride: 107 mmol/L (ref 101–111)
Creatinine, Ser: 0.79 mg/dL (ref 0.44–1.00)
GFR calc Af Amer: >60 mL/min (ref >60)
GFR calc non Af Amer: >60 mL/min (ref >60)
Glucose, Bld: 86 mg/dL (ref 65–99)
Potassium: 3 mmol/L — ABNORMAL LOW (ref 3.5–5.1)
Sodium: 144 mmol/L (ref 135–145)

## 2016-08-16 LAB — MAGNESIUM: Magnesium: 1.6 mg/dL — ABNORMAL LOW (ref 1.7–2.4)

## 2016-08-16 LAB — CBC
HCT: 32.9 % — ABNORMAL LOW (ref 36.0–46.0)
Hemoglobin: 9.8 g/dL — ABNORMAL LOW (ref 12.0–15.0)
MCH: 27.2 pg (ref 26.0–34.0)
MCHC: 29.8 g/dL — ABNORMAL LOW (ref 30.0–36.0)
MCV: 91.4 fL (ref 78.0–100.0)
Platelets: 227 10*3/uL (ref 150–400)
RBC: 3.6 MIL/uL — ABNORMAL LOW (ref 3.87–5.11)
RDW: 15.2 % (ref 11.5–15.5)
WBC: 4.2 10*3/uL (ref 4.0–10.5)

## 2016-08-16 LAB — HEMOGLOBIN A1C
Hgb A1c MFr Bld: 4.9 % (ref 4.8–5.6)
Mean Plasma Glucose: 94 mg/dL

## 2016-08-16 MED ORDER — ACETAMINOPHEN 325 MG PO TABS: 650.0000 mg | ORAL_TABLET | Freq: Four times a day (QID) | ORAL | Status: DC | PRN

## 2016-08-16 MED ORDER — OXYCODONE-ACETAMINOPHEN 5-325 MG PO TABS: 1.0000 | ORAL_TABLET | Freq: Four times a day (QID) | ORAL | Status: DC | PRN

## 2016-08-16 MED ORDER — BISACODYL 5 MG PO TBEC
5.0000 mg | DELAYED_RELEASE_TABLET | Freq: Once | ORAL | Status: AC
  Administered 2016-08-16: 5 mg via ORAL
  Filled 2016-08-16: qty 1

## 2016-08-16 MED ORDER — MAGNESIUM SULFATE 2 GM/50ML IV SOLN
2.0000 g | Freq: Once | INTRAVENOUS | Status: AC
  Administered 2016-08-16: 2 g via INTRAVENOUS
  Filled 2016-08-16: qty 50

## 2016-08-16 MED ORDER — POTASSIUM CHLORIDE CRYS ER 20 MEQ PO TBCR
30.0000 meq | EXTENDED_RELEASE_TABLET | ORAL | Status: AC
  Administered 2016-08-16 (×2): 30 meq via ORAL
  Filled 2016-08-16 (×2): qty 1

## 2016-08-16 MED ORDER — KETOROLAC TROMETHAMINE 30 MG/ML IJ SOLN
30.0000 mg | Freq: Once | INTRAMUSCULAR | Status: AC
  Administered 2016-08-16: 30 mg via INTRAVENOUS
  Filled 2016-08-16: qty 1

## 2016-08-16 NOTE — Care Management Obs Status (Signed)
[facility] NOTIFICATION   Patient Details  Name: Wendy [name] MRN: [id number] Date of Birth: Mar 05, 1951   Medicare Observation Status Notification Given:  Yes    Sharin Mons, RN 08/16/2016, 4:05 PM

## 2016-08-16 NOTE — Discharge Instructions (Signed)

## 2016-08-16 NOTE — Evaluation (Addendum)
Physical Therapy Evaluation Patient Details Name: Wendy [name] MRN: [id number] DOB: [birth year] Today's Date: 08/16/2016   History of Present Illness  Admitted from ED for observation after fall (lac dorsum fo R hand) and AMS; medical history significant of paroxysmal A. 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 Pt currently with functional limitations due to the deficits listed below (see PT Problem List). Wendy [name] rehab course post THA has been complicated by admission with chest pain and MI; will plan on hip therex while she is in-hospital, and re-starting HHPT post dc.  Pt will benefit from skilled PT to increase their independence and safety with mobility to allow discharge to the venue listed below.       Follow Up Recommendations Home health PT Worth also considering [place] Recommendations  None recommended by PT    Recommendations for Other Services OT consult     Precautions / Restrictions Precautions Precautions: Fall Restrictions Weight Bearing Restrictions: No RLE Weight Bearing: Weight bearing as tolerated      Mobility  Bed Mobility Overal bed mobility: Needs Assistance Bed Mobility: Supine to Sit     Supine to sit: Min assist     General bed mobility comments: Min assist to pull to sit  Transfers Overall transfer level: Needs assistance Equipment used: 1 person hand held assist Transfers: Sit to/from Stand Sit to Stand: Min assist         General transfer comment: Min assist to steady at initial stand  Ambulation/Gait Ambulation/Gait assistance: Min assist Ambulation Distance (Feet): 200 Feet Assistive device: 1 person hand held assist (simulating cane) Gait Pattern/deviations: Step-through pattern     General Gait Details: Cues to self-monitor for activity tolerance  Stairs            Wheelchair  Mobility    Modified Rankin (Stroke Patients Only)       Balance Overall balance assessment: History of Falls                                           Pertinent Vitals/Pain Pain Assessment: 0-10 Pain Score: 8  Pain Location: R hand  (Hip is still sore post op; more pain in hand) Pain Descriptors / Indicators: Throbbing Pain Intervention(s): RN gave pain meds during session    [place] expects to be discharged to:: Private residence Living Arrangements: Other relatives Available Help at Discharge: Family;Available 24 hours/day;Friend(s) Type of Home: Mobile home Home Access: Ramped entrance     Home Layout: One level Home Equipment: [place] - 2 wheels;Shower seat;Bedside commode      Prior Function Level of Independence: Independent         Comments: Independent prior to THA; per chart review has had fall at home leading up to this admission     Hand Dominance   Dominant Hand: Right    Extremity/Trunk Assessment   Upper Extremity Assessment Upper Extremity Assessment: RUE deficits/detail RUE Deficits / Details: R hand painful and swollen; wound on dorsal surface of hand dressed; finger extension is intact; limited AROM with opposition, gross flexion and gross extension; educate pt to keep hand elevated    Lower Extremity Assessment Lower Extremity Assessment: RLE deficits/detail RLE Deficits / Details: R hip is sore post THA, direct  anterior, but grossly WFL for simple mobility activities       Communication   Communication: No difficulties  Cognition Arousal/Alertness: Awake/alert Behavior During Therapy: WFL for tasks assessed/performed Overall Cognitive Status: No family/caregiver present to determine baseline cognitive functioning                                 General Comments: Does not show very good health literacy; has many questions about her pain meds; did not mention taking any other drugs  when asked      General Comments General comments (skin integrity, edema, etc.): We discussed elevating hand and gentle gros finger extension and opposition exercises R Hand    Exercises     Assessment/Plan    PT Assessment Patient needs continued PT services  PT Problem List Decreased strength;Decreased range of motion;Decreased activity tolerance;Decreased balance;Decreased mobility;Decreased knowledge of use of DME;Decreased safety awareness;Decreased knowledge of precautions;Pain       PT Treatment Interventions DME instruction;Gait training;Functional mobility training;Therapeutic activities;Therapeutic exercise;Balance training;Patient/family education    PT Goals (Current goals can be found in the Care Plan section)  Acute Rehab PT Goals Patient Stated Goal: did not state PT Goal Formulation: With patient Time For Goal Achievement: 08/23/16 Potential to Achieve Goals: Good    Frequency Min 3X/week   Barriers to discharge        Co-evaluation               AM-PAC PT "6 Clicks" Daily Activity  Outcome Measure Difficulty turning over in bed (including adjusting bedclothes, sheets and blankets)?: Total Difficulty moving from lying on back to sitting on the side of the bed? : Total Difficulty sitting down on and standing up from a chair with arms (e.g., wheelchair, bedside commode, etc,.)?: A Little Help needed moving to and from a bed to chair (including a wheelchair)?: A Little Help needed walking in hospital room?: A Little Help needed climbing 3-5 steps with a railing? : A Little 6 Click Score: 14    End of Session Equipment Utilized During Treatment: Gait belt Activity Tolerance: Patient tolerated treatment well Patient left: in chair;with call bell/phone within reach;with chair alarm set Nurse Communication: Mobility status PT Visit Diagnosis: Unsteadiness on feet (R26.81);Pain Pain - Right/Left: Right Pain - part of body: Hand    Time: 4098-1191 PT  Time Calculation (min) (ACUTE ONLY): 26 min   Charges:   PT Evaluation $PT Eval Low Complexity: 1 Procedure PT Treatments $Gait Training: 8-22 mins   PT G Codes:   PT G-Codes **NOT FOR INPATIENT CLASS** Functional Assessment Tool Used: Clinical judgement Functional Limitation: Mobility: Walking and moving around Mobility: Walking and Moving Around Current Status ([id number]): At least 1 percent but less than 20 percent impaired, limited or restricted Mobility: Walking and Moving Around Goal Status [phone]): 0 percent impaired, limited or restricted    Roney Marion, PT  [place] Pager [phone] Office [place] 08/16/2016, 10:13 AM

## 2016-08-16 NOTE — Discharge Summary (Signed)
Physician Discharge Summary  Wendy [name] RDE:[id number] DOB: 10-19-1950  PCP: Josetta Huddle, MD  Admit date: 08/15/2016 Discharge date: 08/16/2016  Recommendations for Outpatient Follow-up:  1. Dr. Josetta Huddle, PCP in 3 days with repeat labs (CBC, BMP & Mg). 2. Recommend repeating TSH in 4-6 weeks. May consider further evaluation of nodular thyroid that was noted on CT head by doing a thyroid ultrasound.  Home Health: PT & RN Equipment/Devices: None    Discharge Condition: Improved and stable  CODE STATUS: Full  Diet recommendation: Heart healthy diet.  Discharge Diagnoses:  Principal Problem:   Altered mental state Active Problems:   Hx of CABG   COPD (chronic obstructive pulmonary disease) (HCC)   Chronic back pain   PAF (paroxysmal atrial fibrillation) (HCC)   Chronic anticoagulation   Brief Summary: 66 year old female with a PMH of CAD status post CABG in 1998, postop course complicated by MI, PCI in 2012, 2013 and 2016, PAF on Eliquis, HTN, HLD, obesity, Myoview 07/15/16 for preop clearance for hip surgery was low to moderate risk with possible anterior ischemia, underwent right THR 07/19/16 (Dr. Fredonia Highland) without problems, hospitalized 07/22/16-07/26/16 under cardiology service for chest pain, underwent cath 07/25/16 that showed occluded grafts to the RCA and OM 1 with a 50% native LAD (ulcerative), had left-right collaterals, EF by cath was initially read as depressed but later reevaluated and felt to be more consistent with the echo finding EF 45-50 percent, discharged home in stable condition, presented to ED with altered mental status and somnolence. No significant history was obtainable from patient initially due to her altered mental status. Admitting physician called patient's aunt with home patient lives but was unable to get much information. Patient today indicated that she went to bed in her usual state of health night prior to admission. She had been to visit her son  who is incarcerated out of town. She had taken the dose of her Xanax prior to that. She also smokes "weed" up to 2-3 times a day which she had done that night. She stated that somewhere in the middle of the night, she woke up in bed and then fell off the bed onto her face but no LOC. After a minute or 2, she got up and back into the bed. She then woke up additional 2 times and on those locations tried to get off the bed, probably to use the bathroom but fell and had to be helped up by her nephew. She denied urinary incontinence, seizure like activity, strokelike symptoms, chest pain, dizziness, palpitations or dyspnea. She gives a remote history of seizure 1. She sustained laceration to the right hand but denies any other injuries. She has chronic right groin site pain at site of hip surgery which has not changed. She states that she tries to minimize her opioid pain medications and anxiety medications which she takes up to 2 times daily as needed. In the ED, vitals, labs, chest x-ray and CT did not show significant abnormalities. She was admitted for observation and evaluation.  Assessment and plan:  1. Altered mental status/acute encephalopathy: Unclear etiology.? Related to polypharmacy and drug abuse. Metabolic workup not indicated above causes. CT head, neck, MRI brain without acute findings. ABG did not reveal hypercapnia or significant hypoxia. Ammonia level normal. UDS positive for benzodiazepines, cocaine and THC. Mental status has improved and her acute encephalopathy has resolved. Counseled extensively regarding minimizing her Xanax, Percocet and baclofen which she is willing to consider. She however indicates that  she will not quit smoking "weed". She denies abusing cocaine and does not know how it got there. PT evaluated and recommends home health PT and RN. 2. Falls: Likely related to altered mental status. PT evaluated. POC troponin 1 negative. 3. Paroxysmal A. Fib: CHA2DS2-VASc is at least 3,  she has an Eliquis, continued. She is also on aspirin and Plavix as per cardiology, probably related to her extensive CAD. Telemetry shows SB in the 50s-SR. 4. Chronic systolic CHF: Compensated. Continue Lasix, nitrates, Ranexa and beta blockers. 5. COPD: Stable without cervical bronchospasm. 6. Chronic back pain: Pain medication discussion as above. States that she has seen a pain management/neurosurgery M.D. in the past and has received injection to her back 6 weeks ago without significant improvement. Outpatient follow-up. 7. Substance abuse: UDS showed benzodiazepines, cocaine and THC. Please see discussion above. Cessation counseled. Acetaminophen level <10. 8. Essential hypertension: Controlled. 9. Hyperlipidemia: Continue statin sensitivity. 10. Anemia: Stable. Close outpatient follow-up with repeat CBCs. 11. Hypokalemia and hypomagnesemia: Replaced prior to discharge. Follow BMP and magnesium as outpatient. 12. Abnormal TSH/low: Clinically euthyroid. Recommend repeating TSH in 4-6 weeks. Nodular thyroid noted on CT-outpatient evaluation as deemed necessary. TSH recently on 07/22/16 was normal at 1.357. 13. Anxiety & depression: Continue Zoloft. Advised to minimize Xanax. 14. Right hand laceration: May have a small hematoma. Advised elevation of limb, dry dressing changes daily, pain medications as needed, seek immediate medical attention if there is any worsening of pain, swelling, color, fever, chills or limitations in motor activity. She verbalizes understanding. Outpatient follow-up with PCP. X-rays without acute bony abnormalities. 15. Status post recent right THR: Surgical wound has healed. X-rays without acute findings. Chronic right groin pain postoperatively. Outpatient follow-up with orthopedics-has appointment for ? 6/27.   Consultations:  None  Procedures:  None   Discharge Instructions  Discharge Instructions    (HEART FAILURE PATIENTS) Call MD:  Anytime you have any of  the following symptoms: 1) 3 pound weight gain in 24 hours or 5 pounds in 1 week 2) shortness of breath, with or without a dry hacking cough 3) swelling in the hands, feet or stomach 4) if you have to sleep on extra pillows at night in order to breathe.    Complete by:  As directed    Call MD for:    Complete by:  As directed    Recurrent confusion.   Call MD for:  difficulty breathing, headache or visual disturbances    Complete by:  As directed    Call MD for:  extreme fatigue    Complete by:  As directed    Call MD for:  persistant dizziness or light-headedness    Complete by:  As directed    Call MD for:  redness, tenderness, or signs of infection (pain, swelling, redness, odor or green/yellow discharge around incision site)    Complete by:  As directed    Call MD for:  severe uncontrolled pain    Complete by:  As directed    Diet - low sodium heart healthy    Complete by:  As directed    Increase activity slowly    Complete by:  As directed        Medication List    STOP taking these medications   ondansetron 4 MG tablet Commonly known as:  ZOFRAN     TAKE these medications   acetaminophen 325 MG tablet Commonly known as:  TYLENOL Take 2 tablets (650 mg total) by mouth every 6 (  six) hours as needed for mild pain or headache. Acetaminophen from all sources not to exceed >4 g in a 24-hour period. What changed:  when to take this  additional instructions   albuterol 108 (90 Base) MCG/ACT inhaler Commonly known as:  PROVENTIL HFA;VENTOLIN HFA Inhale 2 puffs into the lungs every 6 (six) hours as needed for wheezing or shortness of breath.   ALPRAZolam 0.5 MG tablet Commonly known as:  XANAX Take 0.5 mg by mouth 3 (three) times daily as needed for anxiety.   apixaban 5 MG Tabs tablet Commonly known as:  ELIQUIS Take 1 tablet (5 mg total) by mouth 2 (two) times daily.   aspirin 81 MG EC tablet Take 1 tablet (81 mg total) by mouth daily.   atorvastatin 80 MG  tablet Commonly known as:  LIPITOR TAKE 1 TABLET (80 MG TOTAL) BY MOUTH DAILY AT 6 PM.   baclofen 20 MG tablet Commonly known as:  LIORESAL Take 20 mg by mouth 3 (three) times daily as needed for muscle spasms.   cetirizine 10 MG tablet Commonly known as:  ZYRTEC Take 10 mg by mouth daily as needed for allergies.   cholecalciferol 1000 units tablet Commonly known as:  VITAMIN D Take 2,000 Units by mouth daily.   clopidogrel 75 MG tablet Commonly known as:  PLAVIX Take 75 mg by mouth daily.   diclofenac sodium 1 % Gel Commonly known as:  VOLTAREN Apply 1 g topically daily as needed (pain).   ezetimibe 10 MG tablet Commonly known as:  ZETIA Take 10 mg by mouth daily.   FISH OIL + D3 PO Take 1 capsule by mouth daily. 1200 mg fish oil 600 mg Omega 3 2000 IU Vitamin D   furosemide 20 MG tablet Commonly known as:  LASIX Take 1 tablet (20 mg total) by mouth daily.   isosorbide mononitrate 60 MG 24 hr tablet Commonly known as:  IMDUR Take 1 tablet (60 mg total) by mouth every morning.   metoprolol tartrate 50 MG tablet Commonly known as:  LOPRESSOR Take 1 tablet (50 mg total) by mouth 2 (two) times daily.   nitroGLYCERIN 0.4 MG SL tablet Commonly known as:  NITROSTAT Place 0.4 mg under the tongue every 5 (five) minutes as needed for chest pain.   oxyCODONE-acetaminophen 5-325 MG tablet Commonly known as:  ROXICET Take 1 tablet by mouth every 6 (six) hours as needed for severe pain. What changed:  how much to take  when to take this   pantoprazole 40 MG tablet Commonly known as:  PROTONIX Take 1 tablet (40 mg total) by mouth daily.   pramipexole 0.25 MG tablet Commonly known as:  MIRAPEX Take 0.25 mg by mouth at bedtime as needed (for restless leg).   RANEXA 1000 MG SR tablet Generic drug:  ranolazine Take 1,000 mg by mouth 2 (two) times daily.   sertraline 100 MG tablet Commonly known as:  ZOLOFT Take 150 mg by mouth daily.      Follow-up Information     Josetta Huddle, MD. Schedule an appointment as soon as possible for a visit in 3 day(s).   Specialty:  Internal Medicine Why:  To be seen with repeat labs (CBC, BMP & Mg). Contact information: [address] [place]  [address] [phone]          Allergies  Allergen Reactions  . Benadryl [Diphenhydramine] Shortness Of Breath  . Adhesive [Tape] Other (See Comments)    Burning of skin  .  Amoxicillin Diarrhea and Other (See Comments)    Has patient had a PCN reaction causing immediate rash, facial/tongue/throat swelling, SOB or lightheadedness with hypotension: No Has patient had a PCN reaction causing severe rash involving mucus membranes or skin necrosis: No Has patient had a PCN reaction that required hospitalization No Has patient had a PCN reaction occurring within the last 10 years: Yes If all of the above answers are "NO", then may proceed with Cephalosporin use.   Yvette Rack [Cyclobenzaprine] Other (See Comments)    Causes restless leg syndrome  . Hydrocodone Itching      Procedures/Studies: Ct Head Wo Contrast  Result Date: 08/15/2016 CLINICAL DATA:  Mental status changes, lethargy and 2 separate falls within the last 24 hours with forehead hematoma. Initial encounter. EXAM: CT HEAD WITHOUT CONTRAST CT CERVICAL SPINE WITHOUT CONTRAST TECHNIQUE: Multidetector CT imaging of the head and cervical spine was performed following the standard protocol without intravenous contrast. Multiplanar CT image reconstructions of the cervical spine were also generated. COMPARISON:  None. FINDINGS: CT HEAD FINDINGS Brain: Low density lacunar infarct within the inferior aspect of the left basal ganglia is likely old in age based on appearance. No evidence of acute infarction, hemorrhage, hydrocephalus, extra-axial collection or mass lesion/mass effect. Vascular: No hyperdense vessel or unexpected calcification. Skull: Normal. Negative for fracture or focal lesion.  Sinuses/Orbits: No acute finding. Other: Mild frontal scalp soft tissue swelling. CT CERVICAL SPINE FINDINGS Alignment: Normal alignment without evidence of subluxation. Skull base and vertebrae: No evidence of acute fracture. No bony lesions identified. Soft tissues and spinal canal: No prevertebral fluid or swelling. No visible canal hematoma. Disc levels: The C5 and C6 vertebral bodies are nearly completely fused. There is moderate disc space narrowing at C6-7. Milder degenerative disc disease is present at C2-3, C3-4, C4-5 and C7-T1. Upper chest: Negative. 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 IMPRESSION: 1. 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 No acute findings by head CT. 2. No evidence of acute cervical injury. Diffuse cervical degenerative disc disease present with nearly completely fused C5 and C6 vertebral bodies. 3. 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 Consider elective evaluation with thyroid ultrasound and correlation with thyroid function tests. Electronically Signed   By: Aletta Edouard M.D.   On: 08/15/2016 11:28   Ct Cervical Spine Wo Contrast  Result Date: 08/15/2016 CLINICAL DATA:  Mental status changes, lethargy and 2 separate falls within the last 24 hours with forehead hematoma. Initial encounter. EXAM: CT HEAD WITHOUT CONTRAST CT CERVICAL SPINE WITHOUT CONTRAST TECHNIQUE: Multidetector CT imaging of the head and cervical spine was performed following the standard protocol without intravenous contrast. Multiplanar CT image reconstructions of the cervical spine were also generated. COMPARISON:  None. FINDINGS: CT HEAD FINDINGS Brain: Low density lacunar infarct within the inferior aspect of the left basal ganglia is likely old in age based on appearance. No evidence of acute infarction, hemorrhage, hydrocephalus, extra-axial collection or mass  lesion/mass effect. Vascular: No hyperdense vessel or unexpected calcification. Skull: Normal. Negative for fracture or focal lesion. Sinuses/Orbits: No acute finding. Other: Mild frontal scalp soft tissue swelling. CT CERVICAL SPINE FINDINGS Alignment: Normal alignment without evidence of subluxation. Skull base and vertebrae: No evidence of acute fracture. No bony lesions identified. Soft tissues and spinal canal: No prevertebral fluid or swelling. No visible canal hematoma. Disc levels: The C5 and C6 vertebral bodies are nearly completely fused. There is moderate  disc space narrowing at C6-7. Milder degenerative disc disease is present at C2-3, C3-4, C4-5 and C7-T1. Upper chest: Negative. 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 IMPRESSION: 1. 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 No acute findings by head CT. 2. No evidence of acute cervical injury. Diffuse cervical degenerative disc disease present with nearly completely fused C5 and C6 vertebral bodies. 3. 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 Consider elective evaluation with thyroid ultrasound and correlation with thyroid function tests. Electronically Signed   By: Aletta Edouard M.D.   On: 08/15/2016 11:28   Mr Brain Wo Contrast  Result Date: 08/15/2016 CLINICAL DATA:  Altered mental status and multiple falls. EXAM: MRI HEAD WITHOUT CONTRAST TECHNIQUE: Multiplanar, multiecho pulse sequences of the brain and surrounding structures were obtained without intravenous contrast. COMPARISON:  Head CT 08/15/2016 FINDINGS: Brain: The midline structures are normal. There is no focal diffusion restriction to indicate acute infarct. There is an enlarged perivascular space of the left lentiform nucleus. There are a few scattered foci of hyperintense T2 weighted signal in the subcortical white matter, nonspecific and seen in normal  patients of this age. No intraparenchymal hematoma or chronic microhemorrhage. Brain volume is normal for age without age-advanced or lobar predominant atrophy. The dura is normal and there is no extra-axial collection. Vascular: Major intracranial arterial and venous sinus flow voids are preserved. Skull and upper cervical spine: The visualized skull base, calvarium, upper cervical spine and extracranial soft tissues are normal. Sinuses/Orbits: Partially atelectatic right maxillary sinus. Small amount of bilateral mastoid fluid. Normal orbits. IMPRESSION: Normal brain MRI for age. Electronically Signed   By: Ulyses Jarred M.D.   On: 08/15/2016 20:18   Dg Chest Port 1 View  Result Date: 08/15/2016 CLINICAL DATA:  Patient with mental status changes. Status post fall. EXAM: PORTABLE CHEST 1 VIEW COMPARISON:  Chest radiograph 07/22/2016 FINDINGS: Multiple monitoring leads overlie the patient. Patient status post median sternotomy and CABG procedure. Stable cardiac and mediastinal contours. Elevation right hemidiaphragm. No consolidative pulmonary opacities. No pleural effusion or pneumothorax. IMPRESSION: No acute cardiopulmonary process. Electronically Signed   By: Lovey Newcomer M.D.   On: 08/15/2016 10:43   Dg Hand Complete Right  Result Date: 08/15/2016 CLINICAL DATA:  Bruising and swelling following laceration at third and fourth metacarpal regions EXAM: RIGHT HAND - COMPLETE 3+ VIEW COMPARISON:  September 24, 2005. FINDINGS: Frontal, oblique, and lateral views were obtained. There is a a screw transfixing the fourth DIP joint. There is partial ankylosis of the fourth DIP joint. There is soft tissue swelling medially and dorsally, marked. There is no radiopaque foreign body or soft tissue air in this area. There is no acute fracture or dislocation. There is moderate osteoarthritic change in all PIP and DIP joints. No erosive change or bony destruction. IMPRESSION: Marked soft tissue swelling along the dorsal,  medial aspect of the hand. No radiopaque foreign body or soft tissue air. No bony destruction or erosion. 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 No fracture or dislocation. Electronically Signed   By: Lowella Grip III M.D.   On: 08/15/2016 11:37   Dg Hip Unilat With Pelvis 2-3 Views Right  Result Date: 08/15/2016 CLINICAL DATA:  Fall today with right hip pain. EXAM: DG HIP (WITH OR WITHOUT PELVIS) 2-3V RIGHT COMPARISON:  07/19/2016 FINDINGS: Right total hip arthroplasty intact and unchanged. Mild degenerate change of the left hip. No evidence of acute  fracture or dislocation. Surgical clips are present over the inguinal regions bilaterally. There is mild degenerative change of the spine. IMPRESSION: No acute findings. Electronically Signed   By: Marin Olp M.D.   On: 08/15/2016 17:22      Subjective: Appropriate pain and swelling of right dorsal hand. Improved with Toradol overnight but advised her to minimize NSAID use due to her being on aspirin, Plavix and Eliquis would increase bleeding risk/ulcers. No headache, dizziness, chest pain, dyspnea or palpitations. Chronic right groin pain at site of surgical scar. History related to fall as stated above.  Discharge Exam:  Vitals:   08/15/16 2325 08/16/16 0551 08/16/16 1300 08/16/16 1544  BP: (!) 126/41 (!) 141/53 (!) 93/43 (!) 110/51  Pulse: (!) 53 (!) 52 (!) 55   Resp: 20 20 18    Temp: 97.8 F (36.6 C)  98 F (36.7 C)   TempSrc: Oral  Oral   SpO2: 100% 93% 92%   Weight:      Height:        General: Pleasant middle-aged female, moderately built and nourished sitting up comfortably in chair this morning. Cardiovascular: S1 & S2 heard, RRR, S1/S2 +. No murmurs, rubs, gallops or clicks. No JVD or pedal edema. Telemetry: SB in the 50s-SR. Respiratory: Clear to auscultation without wheezing, rhonchi or crackles. No increased work of breathing. Abdominal:  Non distended, non tender & soft. No organomegaly or masses  appreciated. Normal bowel sounds heard. CNS: Alert and oriented. No focal deficits. Extremities: no edema, no cyanosis. Right dorsal hand with approximately 1 cm ?superficial laceration/ looks mostly like skin tear, swelling of dorsum of hand but no acute findings suggestive of infection, no fluctuance, no crepitus. No active bleeding but has some dried blood. Good radial pulses felt. Mild swelling of fingers but able to move them well. No features to suggest compartment syndrome.    The results of significant diagnostics from this hospitalization (including imaging, microbiology, ancillary and laboratory) are listed below for reference.     Microbiology: No results found for this or any previous visit (from the past 240 hour(s)).   Labs: CBC:  Recent Labs Lab 08/15/16 1004 08/16/16 0347  WBC 6.8 4.2  HGB 9.7* 9.8*  HCT 32.0* 32.9*  MCV 90.9 91.4  PLT 270 979   Basic Metabolic Panel:  Recent Labs Lab 08/15/16 1004 08/16/16 0347 08/16/16 0755  NA 144 144  --   K 3.4* 3.0*  --   CL 108 107  --   CO2 26 28  --   GLUCOSE 127* 86  --   BUN 20 13  --   CREATININE 0.91 0.79  --   CALCIUM 9.3 8.8*  --   MG  --   --  1.6*   Liver Function Tests:  Recent Labs Lab 08/15/16 1004  AST 16  ALT 10*  ALKPHOS 79  BILITOT 0.5  PROT 7.0  ALBUMIN 3.0*   BNP (last 3 results) Hgb A1c  Recent Labs  08/15/16 1639  HGBA1C 4.9   Thyroid function studies  Recent Labs  08/15/16 1639  TSH 0.295*   Urinalysis    Component Value Date/Time   COLORURINE YELLOW 08/15/2016 1233   APPEARANCEUR CLEAR 08/15/2016 1233   LABSPEC 1.016 08/15/2016 1233   PHURINE 6.0 08/15/2016 1233   GLUCOSEU NEGATIVE 08/15/2016 1233   HGBUR NEGATIVE 08/15/2016 1233   BILIRUBINUR NEGATIVE 08/15/2016 1233   KETONESUR NEGATIVE 08/15/2016 1233   PROTEINUR NEGATIVE 08/15/2016 1233   UROBILINOGEN 1.0  09/24/2013 1951   NITRITE NEGATIVE 08/15/2016 [place] 08/15/2016 1233       Time coordinating discharge: Over 30 minutes  SIGNED:  Vernell Leep, MD, FACP, [place]. [facility] Pager [phone]  If 7PM-7AM, please contact night-coverage [email or web address] Password [facility] 08/16/2016, 4:17 PM

## 2016-08-16 NOTE — Progress Notes (Signed)
Patient was discharged home with home health by MD order; discharged instructions  review and give to patient with care notes; IV DIC; patient received her belongings from security; patient will be escorted to the car by nurse tech via wheelchair.

## 2016-08-16 NOTE — Progress Notes (Signed)
Patient is a high fall risk and is also a bleeding risk due to taking eliquis, a low bed has been ordered for this patient.

## 2016-08-16 NOTE — Progress Notes (Signed)
Patient requested tylenol at 2100 to help with right arm pain. Patient c/o arm pain did not decrease and requesting something stronger. PRN percocet was given at 2234 with no effect per patient. Baclofen and xanax were given at 2334 with no effect per patient. Schorr,NP notified. Schorr,NP placed one time order for 30mg  of toradol. Toradol given at 0050. Will continue to monitor and treat per MD orders.

## 2016-08-18 NOTE — Progress Notes (Signed)
Cardiology Office Note    Date:  08/22/2016   ID:  Wendy [name], DOB 12-Aug-1950, MRN [id number]  PCP:  Josetta Huddle, MD  Cardiologist:  Dr. [name]  Chief Complaint: Hospital follow up for chest pain   History of Present Illness:   Wendy [name] is a 66 y.o. female with hx of CAD, PAF on eliquis, HTN, HLD,  DJD and polysubstance abuse presents for hospital follow up.   Hx of CAD s/p CABG in '98 with an LIMA-LAD, SVG-OM, and SVG-RCA. Post op course complicated by post op MI secondary to native RCA occlusion. She then had a PCI to the [facility] Feb 2012, PCI again SVG-RCA 2013, and 2016.   She had a Myoview 07/15/16 as a pre op clearance for hip surgery. The Myoview was low to moderate risk with possible anterior ischemia. She underwent Rt THR 07/19/16 without problems.   She was admitted 5/18-5/22 for chest pressure. Troponin and BNP was elevated. An echo done 07/23/16 showed an EF of 60-65%. She was given IV Lasix for a few doses. Cath done 07/25/16 showed occluded grafts to the RCA and OM1 with a 50% native LAD (ulcerative). She had L-R collaterals. Her EF at cath was initially read as depressed but later re evaluated and felt to be more consistent with the echo finding- EF 45-50%. It was noted that she was on 120 mg of Imdur BID prior to admission. She was pain free on low dose nitro paste in the hospital. It was felt she may have developed some resistance to nitrates secondary to a high chronic BID dose and this was decreased at discharge.   Again admitted 6/11-6/12 for fall and AMS for unclear etiology. May be related to polypharmacy or drug abuse. UDS positive for benzodiazepines, cocaine and THC. She had right hand laceration and hematoma due to fall. No fracture or dislocation on  x-ray. Advised to elevate the hand. Recommended outpatient evaluation of thyroid nodule.   Here today for follow up. The patient complains of right hand pain due to recent fall. Her discoloration and  pain has been worse. Has appointment with PCP tomorrow. She is also complaining of right hip pain with ambulation. Last night she had a episode of substernal chest pressure resolved with sublingual nitroglycerin. Today she is fatigued and tired after walking for appointment.   Past Medical History:  Diagnosis Date  . Anemia   . Anginal pain (HCC)    jaw pain  . Anxiety   . Arthritis   . CAD (coronary artery disease), native coronary artery    a. Hx CABG 1998, last PCI 2013 b.LHC 07/2013:  EF 55-60%, L-LAD prob atretic, no sig disease in LAD, S-OM1 ok with patent stent, S-dRCA ok => med Rx. c. LHC 01/2015 DES to SVG-RCA.  Marland Kitchen Chronic back pain   . Chronic leg pain   . COPD (chronic obstructive pulmonary disease) ([place])   . Fall 08/2016  . GERD (gastroesophageal reflux disease)   . Glucose intolerance (impaired glucose tolerance)   . HLD (hyperlipidemia)   . Hypertension   . Myocardial infarction ([place])   . PAD (peripheral artery disease) ([place])   . PAF (paroxysmal atrial fibrillation) ([place])    a. 09/2013 post-op b. 01/2015  . Peripheral neuropathy   . Shortness of breath    when walking  . Sleep apnea    uses a cpap  . Wears glasses     Past Surgical History:  Procedure Laterality Date  .  CARDIAC CATHETERIZATION N/A 01/22/2015   Procedure: Left Heart Cath and Coronary Angiography;  Surgeon: Troy Sine, MD;  Location: [place] [facility];  Service: Cardiovascular;  Laterality: N/A;  . CARDIAC CATHETERIZATION N/A 01/22/2015   Procedure: Coronary Stent Intervention;  Surgeon: Troy Sine, MD;  Location: [place] [facility];  Service: Cardiovascular;  Laterality: N/A;  3.0x12 xience prox SVG to RCA  . CARDIAC SURGERY  1998   LIMA-LAD, SVG-OM, SVG-RCA  . [place] and again several years later  . CHOLECYSTECTOMY  09/17/2013   dr [name]  . CHOLECYSTECTOMY N/A 09/16/2013   Procedure: LAPAROSCOPIC CHOLECYSTECTOMY CONVERTED TO OPEN CHOLECYSTECTOMY WITH  CHOLANGIOGRAM;  Surgeon: [name], MD;  Location: [facility];  Service: General;  Laterality: N/A;  . CORONARY ANGIOPLASTY WITH STENT PLACEMENT  11/2011, 02/2012, 01/2015   DES to SVG-RCA both times, In [place], [place], Dr. Bruce Donath  . ERCP N/A 09/19/2013   Procedure: ENDOSCOPIC RETROGRADE CHOLANGIOPANCREATOGRAPHY (ERCP);  Surgeon: [name], MD;  Location: [facility];  Service: Endoscopy;  Laterality: N/A;  . FINGER SURGERY Right    ring finger  . i and d right subcostal wound  10/17/13  . IRRIGATION AND DEBRIDEMENT ABSCESS Right 10/17/2013   Procedure: INCISION AND DRAINAGE RIGHT SUBCOSTAL WOUND;  Surgeon: [name], MD;  Location: [facility];  Service: General;  Laterality: Right;  . KNEE ARTHROSCOPY Right 1998  . LEFT HEART CATH AND CORS/GRAFTS ANGIOGRAPHY N/A 07/25/2016   Procedure: Left Heart Cath and Cors/Grafts Angiography;  Surgeon: Jettie Booze, MD;  Location: [facility];  Service: Cardiovascular;  Laterality: N/A;  . LEFT HEART CATHETERIZATION WITH CORONARY/GRAFT ANGIOGRAM N/A 07/19/2013   Procedure: LEFT HEART CATHETERIZATION WITH Beatrix Fetters;  Surgeon: Leonie Man, MD;  Location: [facility];  Service: Cardiovascular;  Laterality: N/A;  . MASS EXCISION Right 06/25/2014   Procedure: EXCISION BUTTOCK MASS ;  Surgeon: Coralie Keens, MD;  Location: [facility];  Service: General;  Laterality: Right;  . ORIF HUMERUS FRACTURE Left 08/27/2013   Procedure: OPEN REDUCTION INTERNAL FIXATION (ORIF) LEFT HUMERUS ;  Surgeon: Rozanna Box, MD;  Location: [facility];  Service: Orthopedics;  Laterality: Left;  . TOTAL HIP ARTHROPLASTY Right 07/19/2016   Procedure: TOTAL HIP ARTHROPLASTY ANTERIOR APPROACH;  Surgeon: [name], MD;  Location: [facility];  Service: Orthopedics;  Laterality: Right;    Current Medications: Prior to Admission medications   Medication Sig Start Date End Date Taking? Authorizing Provider  acetaminophen (TYLENOL)  325 MG tablet Take 2 tablets (650 mg total) by mouth every 6 (six) hours as needed for mild pain or headache. Acetaminophen from all sources not to exceed >4 g in a 24-hour period. 08/16/16   Hongalgi, Lenis Dickinson, MD  albuterol (PROVENTIL HFA;VENTOLIN HFA) 108 (90 BASE) MCG/ACT inhaler Inhale 2 puffs into the lungs every 6 (six) hours as needed for wheezing or shortness of breath.    [provider]  ALPRAZolam Duanne Moron) 0.5 MG tablet Take 0.5 mg by mouth 3 (three) times daily as needed for anxiety.    [provider]  apixaban (ELIQUIS) 5 MG TABS tablet Take 1 tablet (5 mg total) by mouth 2 (two) times daily. 08/04/16   Nahser, Wonda Cheng, MD  aspirin EC 81 MG EC tablet Take 1 tablet (81 mg total) by mouth daily. 07/27/16   Erlene Quan, PA-C  atorvastatin (LIPITOR) 80 MG tablet TAKE 1 TABLET (80 MG TOTAL) BY MOUTH DAILY  AT 6 PM. 10/05/15   Ahmed Prima, Fransisco Hertz, PA-C  baclofen (LIORESAL) 20 MG tablet Take 20 mg by mouth 3 (three) times daily as needed for muscle spasms.    [provider]  cetirizine (ZYRTEC) 10 MG tablet Take 10 mg by mouth daily as needed for allergies.     [provider]  cholecalciferol (VITAMIN D) 1000 units tablet Take 2,000 Units by mouth daily.    [provider]  clopidogrel (PLAVIX) 75 MG tablet Take 75 mg by mouth daily.    [provider]  diclofenac sodium (VOLTAREN) 1 % GEL Apply 1 g topically daily as needed (pain).    [provider]  ezetimibe (ZETIA) 10 MG tablet Take 10 mg by mouth daily.    [provider]  Fish Oil-Cholecalciferol (FISH OIL + D3 PO) Take 1 capsule by mouth daily. 1200 mg fish oil 600 mg Omega 3 2000 IU Vitamin D    [provider]  furosemide (LASIX) 20 MG tablet Take 1 tablet (20 mg total) by mouth daily. 07/26/16 07/26/17  Erlene Quan, PA-C  isosorbide mononitrate (IMDUR) 60 MG 24 hr tablet Take 1 tablet (60 mg total) by mouth every morning. 07/26/16 07/26/17  Erlene Quan, PA-C  metoprolol (LOPRESSOR) 50 MG tablet Take 1 tablet (50 mg total) by mouth 2 (two) times daily. 06/24/16   Nahser, Wonda Cheng, MD  nitroGLYCERIN (NITROSTAT) 0.4 MG SL tablet Place 0.4 mg under the tongue every 5 (five) minutes as needed for chest pain.    [provider]  oxyCODONE-acetaminophen (ROXICET) 5-325 MG tablet Take 1 tablet by mouth every 6 (six) hours as needed for severe pain. 08/16/16   Hongalgi, Lenis Dickinson, MD  pantoprazole (PROTONIX) 40 MG tablet Take 1 tablet (40 mg total) by mouth daily. 07/27/16   Erlene Quan, PA-C  pramipexole (MIRAPEX) 0.25 MG tablet Take 0.25 mg by mouth at bedtime as needed (for restless leg).     [provider]  ranolazine (RANEXA) 1000 MG SR tablet Take 1,000 mg by mouth 2 (two) times daily.    [provider]  sertraline (ZOLOFT) 100 MG tablet Take 150 mg by mouth daily.     [provider]    Allergies:   Benadryl [diphenhydramine]; Adhesive [tape]; Amoxicillin; Flexeril [cyclobenzaprine]; and Hydrocodone   Social History   Social History  . Marital status: Divorced    Spouse name: N/A  . Number of children: 5  . Years of education: N/A   Occupational History  . Disabled    Social History Main Topics  . Smoking status: Former Smoker    Quit date: 07/20/1998  . Smokeless tobacco: Never Used  . Alcohol use No  . Drug use: No  . Sexual activity: Not Asked   Other Topics Concern  . None   Social History Narrative   Lives with best friend.      Family History:  The patient's family history includes Clotting disorder in her father; Heart disease in her brother and brother; Lung cancer in her mother and sister.   ROS:   Please see the history of present illness.    ROS All other systems reviewed and are negative.   PHYSICAL EXAM:   VS:  BP 110/62   Pulse 63   Ht 5\' 1"  (1.549 m)   Wt 162 lb 12.8 oz (73.8 kg)   SpO2 94%   BMI 30.76 kg/m    GEN: Well nourished, well developed, in  no acute  distress  HEENT: normal  Neck: no JVD, carotid bruits, or masses Cardiac: RRR; no murmurs, rubs, or gallops,no edema  Respiratory:  clear to auscultation bilaterally, normal work of breathing GI: soft, nontender, nondistended, + BS MS: Right wrist has moderate hematoma with bluish discoloration. No numbness with palpitations.  Skin: warm and dry, no rash Neuro:  Alert and Oriented x 3, sensation are intact Psych: euthymic mood, full affect  Wt Readings from Last 3 Encounters:  08/22/16 162 lb 12.8 oz (73.8 kg)  08/15/16 165 lb 12.8 oz (75.2 kg)  07/26/16 180 lb (81.6 kg)      Studies/Labs Reviewed:   EKG:  EKG is not ordered today.   Recent Labs: 07/22/2016: B Natriuretic Peptide 954.4 08/15/2016: ALT 10; TSH 0.295 08/16/2016: BUN 13; Creatinine, Ser 0.79; Hemoglobin 9.8; Magnesium 1.6; Platelets 227; Potassium 3.0; Sodium 144   Lipid Panel    Component Value Date/Time   CHOL 138 08/13/2015 0939   TRIG 241 (H) 08/13/2015 0939   HDL 34 (L) 08/13/2015 0939   CHOLHDL 4.1 08/13/2015 0939   VLDL 48 (H) 08/13/2015 0939   LDLCALC 56 08/13/2015 0939    Additional studies/ records that were reviewed today include:   Echocardiogram: 07/23/16 Study Conclusions  - Left ventricle: The cavity size was normal. Wall thickness was normal. Systolic function was normal. The estimated ejection fraction was in the range of 60% to 65%. Wall motion was normal; there were no regional wall motion abnormalities. The study is not technically sufficient to allow evaluation of LV diastolic function. - Aortic valve: Mildly calcified annulus. Trileaflet. - Mitral valve: Calcified annulus. There was trivial regurgitation. - Right atrium: Central venous pressure (est): 3 mm Hg. - Tricuspid valve: There was trivial regurgitation. - Pulmonary arteries: PA peak pressure: 24 mm Hg (S). - Pericardium, extracardiac: There was no pericardial effusion.  Impressions:  - Normal LV wall  thickness with LVEF 60-65%. Indeterminate diastolic function. Mitral annular calcification with trivial mitral regurgitation. Trivial tricuspid regurgitation with PASP 24 mmHg.  Left Heart Cath and Cors/Grafts Angiography  07/25/16  Conclusion     Severe three-vessel coronary artery disease.  Unable to gain access from the left common femoral. It appears there is a left to right fem-fem graft based on the wire course after needle access to the vessel.  Occluded SVG to marginal, known from prior. LIMA to LAD known to be atretic from prior cath.  SVG to PDA occluded. This is a new finding.  Mid LAD lesion, 50 %stenosed. This is an ulcerated, irregular lesion. There are left to right collaterals.  The left ventricular ejection fraction is 25-35% by visual estimate.  There is moderate to severe left ventricular systolic dysfunction.  LV end diastolic pressure is moderately elevated.  There is no aortic valve stenosis.  Of note, poor radial pulses bilaterally. Right brachial artery was accessed using ultrasound guidance.  I suspect her troponin elevation is from heart failure. It does not have a typical ACS pattern. I don't think her SVG to RCA occlusion is acute.  Continue medical therapy for heart failure.  Consider viability study. If inferior wall is viable, could consider cardiac surgery consultation for repeat CABG.     ASSESSMENT & PLAN:    1. CAD s/p CABG and multiple PCI - Recent cath as above. Redo CABG would be very high risk procedure. Continue Plavix and Eliquis. Stop aspirin. Continue Lipitor 80 mg qd, Zetia 10 mg qd, Imdur 60 mg daily, Lopressor 50  mg twice a day and Ranexa1000 milligrams twice a day.  2. PAF - Sinus on exam. Continue Eliquis for anticoagulation.  3. HLD - Continue statin.  4 Presume chronic diastolic CHF - EF of 59-56% by cath. Echo showed EF of 60-65%, not sufficient to determine diastolic CHF.   5. Polysubstance  abuse - advice cessation. States "never used cocaine".   6. Thyroid nodules - per PCP  7. Right wrist laceration with hematoma due to recent fall  - Her symptoms has worsened. Looks infected. Advised to follow-up with PCP today greater than tomorrow.  The patient was seen and examined by Dr. [name].   Medication Adjustments/Labs and Tests Ordered: Current medicines are reviewed at length with the patient today.  Concerns regarding medicines are outlined above.  Medication changes, Labs and Tests ordered today are listed in the Patient Instructions below. Patient Instructions  Medication Instructions:   STOP TAKING ASPIRIN 81 MG  If you need a refill on your cardiac medications before your next appointment, please call your pharmacy.  Labwork: NONE ORDERED  TODAY    Testing/Procedures:  NONE ORDERED  TODAY    Follow-Up:  WITH DR NAHSER AS SCHEDULED   FOLLOW UP WITH PRIMARY CARE DOCTOR  FOR RIGHT HAND INFECTION  Any Other Special Instructions Will Be Listed Below (If Applicable).                                                                                                                                                      Jarrett Soho, [place]  08/22/2016 2:19 PM    [facility] [address], [place], [place]  [address] Phone: [phone]; Fax: [phone]   Attending Note:   The patient was seen and examined.  Agree with assessment and plan as noted above.  Changes made to the above note as needed.  Patient seen and independently examined with Robbie Lis, PA .   We discussed all aspects of the encounter. I agree with the assessment and plan as stated above.  1.  I have reviewed the angiograms  She has premature closure of her  stent graft and her left internal mammary artery is atretic. Has has moderate CAD of her LAD and LCx. The RCA is occluded and fills retrograde from left to right collaterals  She complains of  having angina but I think that her options are very limited. I do not see any percutaneous options. Redo surgery would come with considerable risk. I think her best options continue with medical therapy.  I strongly advised her to stay away from drugs. Her last UDS was positive for cocaine and marijuana.   I have spent a total of 40 minutes with patient reviewing hospital  notes , telemetry, EKGs, labs and examining patient as well as establishing an assessment and  plan that was discussed with the patient. > 50% of time was spent in direct patient care.    Thayer Headings, Brooke Bonito., MD, [facility] 08/22/2016, 5:42 PM [address],  [place] Pager [phone]

## 2016-08-22 ENCOUNTER — Ambulatory Visit (INDEPENDENT_AMBULATORY_CARE_PROVIDER_SITE_OTHER): Payer: Medicare HMO | Admitting: Physician Assistant

## 2016-08-22 ENCOUNTER — Encounter: Payer: Self-pay | Admitting: Physician Assistant

## 2016-08-22 VITALS — BP 110/62 | HR 63 | Ht 61.0 in | Wt 162.8 lb

## 2016-08-22 DIAGNOSIS — E784 Other hyperlipidemia: Secondary | ICD-10-CM | POA: Diagnosis not present

## 2016-08-22 DIAGNOSIS — S61511A Laceration without foreign body of right wrist, initial encounter: Secondary | ICD-10-CM

## 2016-08-22 DIAGNOSIS — E7849 Other hyperlipidemia: Secondary | ICD-10-CM

## 2016-08-22 DIAGNOSIS — I480 Paroxysmal atrial fibrillation: Secondary | ICD-10-CM | POA: Diagnosis not present

## 2016-08-22 DIAGNOSIS — I25700 Atherosclerosis of coronary artery bypass graft(s), unspecified, with unstable angina pectoris: Secondary | ICD-10-CM | POA: Diagnosis not present

## 2016-08-22 DIAGNOSIS — E041 Nontoxic single thyroid nodule: Secondary | ICD-10-CM | POA: Diagnosis not present

## 2016-08-22 DIAGNOSIS — F1910 Other psychoactive substance abuse, uncomplicated: Secondary | ICD-10-CM

## 2016-08-22 NOTE — Patient Instructions (Addendum)
Medication Instructions:   STOP TAKING ASPIRIN 81 MG  If you need a refill on your cardiac medications before your next appointment, please call your pharmacy.  Labwork: NONE ORDERED  TODAY    Testing/Procedures:  NONE ORDERED  TODAY    Follow-Up:  WITH DR NAHSER AS SCHEDULED   FOLLOW UP WITH PRIMARY CARE DOCTOR  FOR RIGHT HAND INFECTION  Any Other Special Instructions Will Be Listed Below (If Applicable).

## 2016-08-23 DIAGNOSIS — S61411A Laceration without foreign body of right hand, initial encounter: Secondary | ICD-10-CM | POA: Diagnosis not present

## 2016-08-24 DIAGNOSIS — M1611 Unilateral primary osteoarthritis, right hip: Secondary | ICD-10-CM | POA: Diagnosis not present

## 2016-08-25 DIAGNOSIS — F322 Major depressive disorder, single episode, severe without psychotic features: Secondary | ICD-10-CM | POA: Diagnosis not present

## 2016-08-25 DIAGNOSIS — K5900 Constipation, unspecified: Secondary | ICD-10-CM | POA: Diagnosis not present

## 2016-08-25 DIAGNOSIS — T148XXA Other injury of unspecified body region, initial encounter: Secondary | ICD-10-CM | POA: Diagnosis not present

## 2016-08-31 DIAGNOSIS — M1611 Unilateral primary osteoarthritis, right hip: Secondary | ICD-10-CM | POA: Diagnosis not present

## 2016-09-08 DIAGNOSIS — G4733 Obstructive sleep apnea (adult) (pediatric): Secondary | ICD-10-CM | POA: Diagnosis not present

## 2016-09-08 DIAGNOSIS — S61401A Unspecified open wound of right hand, initial encounter: Secondary | ICD-10-CM | POA: Diagnosis not present

## 2016-10-03 DIAGNOSIS — M545 Low back pain: Secondary | ICD-10-CM | POA: Diagnosis not present

## 2016-10-03 DIAGNOSIS — M25552 Pain in left hip: Secondary | ICD-10-CM | POA: Diagnosis not present

## 2016-10-04 DIAGNOSIS — M545 Low back pain: Secondary | ICD-10-CM | POA: Diagnosis not present

## 2016-10-04 DIAGNOSIS — M7062 Trochanteric bursitis, left hip: Secondary | ICD-10-CM | POA: Diagnosis not present

## 2016-10-04 DIAGNOSIS — M47816 Spondylosis without myelopathy or radiculopathy, lumbar region: Secondary | ICD-10-CM | POA: Diagnosis not present

## 2016-10-21 DIAGNOSIS — E559 Vitamin D deficiency, unspecified: Secondary | ICD-10-CM | POA: Diagnosis not present

## 2016-10-21 DIAGNOSIS — F411 Generalized anxiety disorder: Secondary | ICD-10-CM | POA: Diagnosis not present

## 2016-10-21 DIAGNOSIS — F322 Major depressive disorder, single episode, severe without psychotic features: Secondary | ICD-10-CM | POA: Diagnosis not present

## 2016-10-21 DIAGNOSIS — I739 Peripheral vascular disease, unspecified: Secondary | ICD-10-CM | POA: Diagnosis not present

## 2016-10-21 DIAGNOSIS — G2581 Restless legs syndrome: Secondary | ICD-10-CM | POA: Diagnosis not present

## 2016-10-21 DIAGNOSIS — I480 Paroxysmal atrial fibrillation: Secondary | ICD-10-CM | POA: Diagnosis not present

## 2016-10-21 DIAGNOSIS — I25810 Atherosclerosis of coronary artery bypass graft(s) without angina pectoris: Secondary | ICD-10-CM | POA: Diagnosis not present

## 2016-10-21 DIAGNOSIS — M549 Dorsalgia, unspecified: Secondary | ICD-10-CM | POA: Diagnosis not present

## 2016-10-21 DIAGNOSIS — J449 Chronic obstructive pulmonary disease, unspecified: Secondary | ICD-10-CM | POA: Diagnosis not present

## 2016-10-27 ENCOUNTER — Ambulatory Visit: Payer: Medicare HMO | Admitting: Cardiovascular Disease

## 2016-10-31 ENCOUNTER — Other Ambulatory Visit: Payer: Self-pay | Admitting: Cardiovascular Disease

## 2016-10-31 MED ORDER — NITROGLYCERIN 0.4 MG SL SUBL: 0.4000 mg | SUBLINGUAL_TABLET | SUBLINGUAL | 1 refills | Status: DC | PRN

## 2016-10-31 NOTE — Telephone Encounter (Signed)
Pt's medication was sent to pt's pharmacy as requested. Confirmation received.  °

## 2016-11-11 DIAGNOSIS — M5416 Radiculopathy, lumbar region: Secondary | ICD-10-CM | POA: Diagnosis not present

## 2016-11-11 DIAGNOSIS — Z6829 Body mass index (BMI) 29.0-29.9, adult: Secondary | ICD-10-CM | POA: Diagnosis not present

## 2016-11-22 ENCOUNTER — Other Ambulatory Visit: Payer: Self-pay | Admitting: Cardiovascular Disease

## 2016-11-22 MED ORDER — NITROGLYCERIN 0.4 MG SL SUBL: 0.4000 mg | SUBLINGUAL_TABLET | SUBLINGUAL | 1 refills | Status: DC | PRN

## 2016-11-29 DIAGNOSIS — M47816 Spondylosis without myelopathy or radiculopathy, lumbar region: Secondary | ICD-10-CM | POA: Diagnosis not present

## 2016-11-29 DIAGNOSIS — Z6829 Body mass index (BMI) 29.0-29.9, adult: Secondary | ICD-10-CM | POA: Diagnosis not present

## 2016-11-29 DIAGNOSIS — M5416 Radiculopathy, lumbar region: Secondary | ICD-10-CM | POA: Diagnosis not present

## 2016-11-29 DIAGNOSIS — M5126 Other intervertebral disc displacement, lumbar region: Secondary | ICD-10-CM | POA: Diagnosis not present

## 2016-12-04 NOTE — Progress Notes (Signed)
Cardiology Office Note   Date:  12/05/2016   ID:  Wendy [name], DOB May 12, 1950, MRN [id number]  PCP:  Josetta Huddle, MD  Cardiologist:   [name], MD   Chief Complaint  Patient presents with  . Coronary Artery Disease   Problem List 1. Unstable angina (HCC) - history of CAD s/p CABG SVG-OM, LIMA-LAD, SVG-RCA in 1998, PCI in 2013. She underwent cardiac catheterization here in 07/2013 which showed an atretic LIMA-LAD with no significant disease in the native LAD, patent SVG-OM with patent stent and patent SVG-distRCA.  S/p PCI of native RCA ( within the stent )    2. Paroxysmal Atrial Fibrillation  CHADS2VASC = 4   ( female, age 78, CAD,HTN )     3. Hypertension - BP is well controlled.    4. HLD (hyperlipidemia)  5. COPD (chronic obstructive pulmonary disease) ([place])  Dec. 2, 2016:  Wendy [name] is a 66 y.o. female who presents for follow up of recent hospitalization for chest pain, abnormal blood pressure and PCI of the right coronary artery. Since the discharge she's not been feeling well.   After the stent, she felt very well for a week or so . Then she started Eliquis after she got home. Has felt poorly since that  No bleeding in stool. Thinks the Eliquis is causing her headaches, diarrhea, feels poorly all over.   Symptoms sound like flu like to me .  Wants to sleep all the time  Has not been to the medical doctor .  Has been very vatigued - complete lack of energy for the past week.   No CP , breathing is ok  No focal neurological changes.   August 13, 2015:  Wendy [name] is in atrial fib today .   Is having some generalized weakness and dyspnea.  Feels poorly all the time.   Is anemic,  Has been taking all of her meds But did not take them this am  Does not necessarily feel worse when she is in atrial fib   .she feesl poorly all the time ! Is going for colonoscopy soon   02/11/2016:  Is in atrial fib today  Just started today .   Has not seen  the A-Fib clinic  needs to have hip replacement  surgery and back injections or surgery   Oct. 1, 2018  Wendy [name] is seen today for follow up visit Had a recent episode of CP , resolved with 1 SL NTG .  Was admitted in June with NSTEMI - + for cocaine at that time  Still has occasional episodes of CP, takes NTG wit relief. Exercising  1-2 times a week,  Is on Ranexa 1000 PO BID and Imdur 60 mg a day  Smokes marijuana  Frequently  Avoids salty foods for the most part.     Past Medical History:  Diagnosis Date  . Anemia   . Anginal pain (HCC)    jaw pain  . Anxiety   . Arthritis   . CAD (coronary artery disease), native coronary artery    a. Hx CABG 1998, last PCI 2013 b.LHC 07/2013:  EF 55-60%, L-LAD prob atretic, no sig disease in LAD, S-OM1 ok with patent stent, S-dRCA ok => med Rx. c. LHC 01/2015 DES to SVG-RCA.  Marland Kitchen Chronic back pain   . Chronic leg pain   . COPD (chronic obstructive pulmonary disease) ([place])   . Fall 08/2016  . GERD (gastroesophageal reflux disease)   . Glucose intolerance (  impaired glucose tolerance)   . HLD (hyperlipidemia)   . Hypertension   . Myocardial infarction ([place])   . PAD (peripheral artery disease) ([place])   . PAF (paroxysmal atrial fibrillation) ([place])    a. 09/2013 post-op b. 01/2015  . Peripheral neuropathy   . Shortness of breath    when walking  . Sleep apnea    uses a cpap  . Wears glasses     Past Surgical History:  Procedure Laterality Date  . CARDIAC CATHETERIZATION N/A 01/22/2015   Procedure: Left Heart Cath and Coronary Angiography;  Surgeon: Troy Sine, MD;  Location: [facility];  Service: Cardiovascular;  Laterality: N/A;  . CARDIAC CATHETERIZATION N/A 01/22/2015   Procedure: Coronary Stent Intervention;  Surgeon: Troy Sine, MD;  Location: [facility];  Service: Cardiovascular;  Laterality: N/A;  3.0x12 xience prox SVG to RCA  . CARDIAC SURGERY  1998   LIMA-LAD, SVG-OM, SVG-RCA  . [place] and again several years later  . CHOLECYSTECTOMY  09/17/2013   dr [name]  . CHOLECYSTECTOMY N/A 09/16/2013   Procedure: LAPAROSCOPIC CHOLECYSTECTOMY CONVERTED TO OPEN CHOLECYSTECTOMY WITH CHOLANGIOGRAM;  Surgeon: [name], MD;  Location: [facility];  Service: General;  Laterality: N/A;  . CORONARY ANGIOPLASTY WITH STENT PLACEMENT  11/2011, 02/2012, 01/2015   DES to SVG-RCA both times, In [place], [place], Dr. Bruce Donath  . ERCP N/A 09/19/2013   Procedure: ENDOSCOPIC RETROGRADE CHOLANGIOPANCREATOGRAPHY (ERCP);  Surgeon: [name], MD;  Location: [facility];  Service: Endoscopy;  Laterality: N/A;  . FINGER SURGERY Right    ring finger  . i and d right subcostal wound  10/17/13  . IRRIGATION AND DEBRIDEMENT ABSCESS Right 10/17/2013   Procedure: INCISION AND DRAINAGE RIGHT SUBCOSTAL WOUND;  Surgeon: [name], MD;  Location: [facility];  Service: General;  Laterality: Right;  . KNEE ARTHROSCOPY Right 1998  . LEFT HEART CATH AND CORS/GRAFTS ANGIOGRAPHY N/A 07/25/2016   Procedure: Left Heart Cath and Cors/Grafts Angiography;  Surgeon: Jettie Booze, MD;  Location: [facility];  Service: Cardiovascular;  Laterality: N/A;  . LEFT HEART CATHETERIZATION WITH CORONARY/GRAFT ANGIOGRAM N/A 07/19/2013   Procedure: LEFT HEART CATHETERIZATION WITH Beatrix Fetters;  Surgeon: Leonie Man, MD;  Location: [facility];  Service: Cardiovascular;  Laterality: N/A;  . MASS EXCISION Right 06/25/2014   Procedure: EXCISION BUTTOCK MASS ;  Surgeon: Coralie Keens, MD;  Location: [facility];  Service: General;  Laterality: Right;  . ORIF HUMERUS FRACTURE Left 08/27/2013   Procedure: OPEN REDUCTION INTERNAL FIXATION (ORIF) LEFT HUMERUS ;  Surgeon: Rozanna Box, MD;  Location: [facility];  Service: Orthopedics;  Laterality: Left;  . TOTAL HIP ARTHROPLASTY Right 07/19/2016   Procedure: TOTAL HIP ARTHROPLASTY ANTERIOR APPROACH;  Surgeon: [name], MD;  Location: [facility];   Service: Orthopedics;  Laterality: Right;     Current Outpatient Prescriptions  Medication Sig Dispense Refill  . Acetaminophen 500 MG coapsule Take 500 mg by mouth as needed.    Marland Kitchen albuterol (PROVENTIL HFA;VENTOLIN HFA) 108 (90 BASE) MCG/ACT inhaler Inhale 2 puffs into the lungs every 6 (six) hours as needed for wheezing or shortness of breath.    . ALPRAZolam (XANAX) 0.5 MG tablet Take 0.5 mg by mouth 3 (three) times daily as needed for anxiety.    Marland Kitchen apixaban (ELIQUIS) 5 MG TABS tablet Take 1 tablet (5 mg total) by mouth 2 (two) times daily. 180 tablet 3  .  atorvastatin (LIPITOR) 80 MG tablet TAKE 1 TABLET (80 MG TOTAL) BY MOUTH DAILY AT 6 PM. 90 tablet 3  . baclofen (LIORESAL) 20 MG tablet Take 20 mg by mouth 3 (three) times daily as needed for muscle spasms.    . cetirizine (ZYRTEC) 10 MG tablet Take 10 mg by mouth daily as needed for allergies.     . cholecalciferol (VITAMIN D) 1000 units tablet Take 2,000 Units by mouth daily.    . clopidogrel (PLAVIX) 75 MG tablet Take 75 mg by mouth daily.    . diclofenac sodium (VOLTAREN) 1 % GEL Apply 1 g topically daily as needed (pain).    Marland Kitchen ezetimibe (ZETIA) 10 MG tablet Take 10 mg by mouth daily.    . Fish Oil-Cholecalciferol (FISH OIL + D3 PO) Take 1 capsule by mouth daily. 1200 mg fish oil 600 mg Omega 3 2000 IU Vitamin D    . furosemide (LASIX) 20 MG tablet Take 1 tablet (20 mg total) by mouth daily. 90 tablet 3  . isosorbide mononitrate (IMDUR) 60 MG 24 hr tablet Take 1 tablet (60 mg total) by mouth every morning. 90 tablet 2  . metoprolol (LOPRESSOR) 50 MG tablet Take 1 tablet (50 mg total) by mouth 2 (two) times daily. 180 tablet 3  . nitroGLYCERIN (NITROSTAT) 0.4 MG SL tablet Place 1 tablet (0.4 mg total) under the tongue every 5 (five) minutes as needed for chest pain. 75 tablet 1  . oxyCODONE-acetaminophen (ROXICET) 5-325 MG tablet Take 1 tablet by mouth every 6 (six) hours as needed for severe pain.    . pramipexole (MIRAPEX) 0.25 MG  tablet Take 0.25 mg by mouth at bedtime as needed (for restless leg).     . ranolazine (RANEXA) 1000 MG SR tablet Take 1,000 mg by mouth 2 (two) times daily.    . sertraline (ZOLOFT) 100 MG tablet Take 150 mg by mouth daily.     . Triamcinolone Acetonide (NASACORT ALLERGY 24HR NA) Place 2 sprays into the nose as needed.     Current Facility-Administered Medications  Medication Dose Route Frequency Provider Last Rate Last Dose  . ferrous sulfate tablet 325 mg  325 mg Oral BID WC Irene Shipper, MD        Allergies:   Benadryl [diphenhydramine]; Adhesive [tape]; Amoxicillin; Flexeril [cyclobenzaprine]; and Hydrocodone    Social History:  The patient  reports that she quit smoking about 18 years ago. She has never used smokeless tobacco. She reports that she does not drink alcohol or use drugs.   Family History:  The patient's family history includes Clotting disorder in her father; Heart disease in her brother and brother; Lung cancer in her mother and sister.    ROS:  Please see the history of present illness.    Review of Systems: Constitutional:  admits to , appetite change and fatigue.  HEENT: denies photophobia, eye pain, redness, hearing loss, ear pain, congestion, sore throat, rhinorrhea, sneezing, neck pain, neck stiffness and tinnitus.  Respiratory: admits to SOB, DOE,    Cardiovascular: denies chest pain, palpitations and leg swelling.  Gastrointestinal: admits to nausea, vomiting,  diarrhea,   Genitourinary: denies dysuria, urgency, frequency, hematuria, flank pain and difficulty urinating.  Musculoskeletal: admits to  myalgias,    Skin: denies pallor, rash and wound.  Neurological: admits to dizziness, seizures, syncope, weakness, light-headedness,  headaches.   Hematological: denies adenopathy, easy bruising, personal or family bleeding history.  Psychiatric/ Behavioral: admits to somulence  All other systems are reviewed and negative.    Physical Exam: Blood  pressure 120/70, pulse (!) 51, height 5\' 1"  (1.549 m), weight 156 lb 3.2 oz (70.9 kg), SpO2 94 %.  GEN:  Middle age,  Chronically ill appearing female HEENT: Normal NECK: No JVD; No carotid bruits LYMPHATICS: No lymphadenopathy CARDIAC: RR , normal S1S2 RESPIRATORY:  Clear to auscultation without rales, wheezing or rhonchi  ABDOMEN: Soft, non-tender, non-distended MUSCULOSKELETAL:  No edema; No deformity  SKIN: Warm and dry NEUROLOGIC:  Alert and oriented x 3  EKG:  EKG is not ordered today.   Recent Labs: 07/22/2016: B Natriuretic Peptide 954.4 08/15/2016: ALT 10; TSH 0.295 08/16/2016: BUN 13; Creatinine, Ser 0.79; Hemoglobin 9.8; Magnesium 1.6; Platelets 227; Potassium 3.0; Sodium 144    Lipid Panel    Component Value Date/Time   CHOL 138 08/13/2015 0939   TRIG 241 (H) 08/13/2015 0939   HDL 34 (L) 08/13/2015 0939   CHOLHDL 4.1 08/13/2015 0939   VLDL 48 (H) 08/13/2015 0939   LDLCALC 56 08/13/2015 0939      Wt Readings from Last 3 Encounters:  12/05/16 156 lb 3.2 oz (70.9 kg)  08/22/16 162 lb 12.8 oz (73.8 kg)  08/15/16 165 lb 12.8 oz (75.2 kg)      Other studies Reviewed: Additional studies/ records that were reviewed today include: . Review of the above records demonstrates:    ASSESSMENT AND PLAN:  1. Coronary artery disease:  Cath in May, 2018 shows Attretic LIMA to LAD, occluded SVG to OM, occluded SVG to PDA.  She has very severe coronary artery disease. Her angina pattern sounds stable. I do not think that that it would be advantageous to do a stress myoview - it certainly would be abnormal . Advised her to stay away from smoking cigarettes and smoking or Mariann Wendy [name]. I've also advised her to avoid other substances ( cocaine) that could contribute to angina and coronary artery disease. At this point she is on isosorbide and on Ranexa.  She should continue to take the nitroglycerin as needed. If the pain becomes too severe and does not resolve I've advised her to go to  the emergency room.  2. Paroxysmal atrial fibrillation:  Continue Eliquis . Her rate is well-controlled.  Seems to be maintaining NSR   3.   Current medicines are reviewed at length with the patient today.  The patient does not have concerns regarding medicines.  The following changes have been made:  no change  Labs/ tests ordered today include:  No orders of the defined types were placed in this encounter.    Disposition:   FU with  Me in 6 months     [name], MD  12/05/2016 10:41 AM    [address], [place], [place]  [address] Phone: [phone]; Fax: [phone]

## 2016-12-05 ENCOUNTER — Ambulatory Visit (INDEPENDENT_AMBULATORY_CARE_PROVIDER_SITE_OTHER): Payer: Medicare HMO | Admitting: Cardiovascular Disease

## 2016-12-05 ENCOUNTER — Encounter: Payer: Self-pay | Admitting: Cardiovascular Disease

## 2016-12-05 VITALS — BP 120/70 | HR 51 | Ht 61.0 in | Wt 156.2 lb

## 2016-12-05 DIAGNOSIS — I2510 Atherosclerotic heart disease of native coronary artery without angina pectoris: Secondary | ICD-10-CM | POA: Diagnosis not present

## 2016-12-05 DIAGNOSIS — I480 Paroxysmal atrial fibrillation: Secondary | ICD-10-CM

## 2016-12-05 DIAGNOSIS — E782 Mixed hyperlipidemia: Secondary | ICD-10-CM

## 2016-12-05 LAB — LIPID PANEL
Chol/HDL Ratio: 2.3 ratio (ref 0.0–4.4)
Cholesterol, Total: 98 mg/dL — ABNORMAL LOW (ref 100–199)
HDL: 43 mg/dL (ref >39)
LDL Calculated: 40 mg/dL (ref 0–99)
Triglycerides: 73 mg/dL (ref 0–149)
VLDL Cholesterol Cal: 15 mg/dL (ref 5–40)

## 2016-12-05 LAB — HEPATIC FUNCTION PANEL
ALT: 8 IU/L (ref 0–32)
AST: 8 IU/L (ref 0–40)
Albumin: 3.6 g/dL (ref 3.6–4.8)
Alkaline Phosphatase: 67 IU/L (ref 39–117)
Bilirubin Total: 0.3 mg/dL (ref 0.0–1.2)
Bilirubin, Direct: 0.13 mg/dL (ref 0.00–0.40)
Total Protein: 6.3 g/dL (ref 6.0–8.5)

## 2016-12-05 LAB — BASIC METABOLIC PANEL
BUN/Creatinine Ratio: 15 (ref 12–28)
BUN: 9 mg/dL (ref 8–27)
CO2: 27 mmol/L (ref 20–29)
Calcium: 8.8 mg/dL (ref 8.7–10.3)
Chloride: 104 mmol/L (ref 96–106)
Creatinine, Ser: 0.61 mg/dL (ref 0.57–1.00)
GFR calc Af Amer: 110 mL/min/{1.73_m2} (ref >59)
GFR calc non Af Amer: 95 mL/min/{1.73_m2} (ref >59)
Glucose: 96 mg/dL (ref 65–99)
Potassium: 4 mmol/L (ref 3.5–5.2)
Sodium: 142 mmol/L (ref 134–144)

## 2016-12-05 NOTE — Patient Instructions (Signed)
Medication Instructions:  Your physician recommends that you continue on your current medications as directed. Please refer to the Current Medication list given to you today.   Labwork: TODAY - basic metabolic panel, cholesterol, liver panel   Testing/Procedures: None Ordered   Follow-Up: Your physician wants you to follow-up in: 6 months with Dr. [name].  You will receive a reminder letter in the mail two months in advance. If you don't receive a letter, please call our office to schedule the follow-up appointment.   If you need a refill on your cardiac medications before your next appointment, please call your pharmacy.   Thank you for choosing [facility]! Christen Bame, RN [phone]

## 2016-12-08 DIAGNOSIS — G4733 Obstructive sleep apnea (adult) (pediatric): Secondary | ICD-10-CM | POA: Diagnosis not present

## 2016-12-09 DIAGNOSIS — M47816 Spondylosis without myelopathy or radiculopathy, lumbar region: Secondary | ICD-10-CM | POA: Diagnosis not present

## 2016-12-13 DIAGNOSIS — I10 Essential (primary) hypertension: Secondary | ICD-10-CM | POA: Diagnosis not present

## 2016-12-13 DIAGNOSIS — M47816 Spondylosis without myelopathy or radiculopathy, lumbar region: Secondary | ICD-10-CM | POA: Diagnosis not present

## 2017-01-12 DIAGNOSIS — M5416 Radiculopathy, lumbar region: Secondary | ICD-10-CM | POA: Diagnosis not present

## 2017-01-12 DIAGNOSIS — M47816 Spondylosis without myelopathy or radiculopathy, lumbar region: Secondary | ICD-10-CM | POA: Diagnosis not present

## 2017-01-12 DIAGNOSIS — M5126 Other intervertebral disc displacement, lumbar region: Secondary | ICD-10-CM | POA: Diagnosis not present

## 2017-01-12 DIAGNOSIS — M461 Sacroiliitis, not elsewhere classified: Secondary | ICD-10-CM | POA: Diagnosis not present

## 2017-01-16 ENCOUNTER — Other Ambulatory Visit: Payer: Self-pay | Admitting: *Deleted

## 2017-01-16 MED ORDER — PANTOPRAZOLE SODIUM 40 MG PO TBEC: 40.0000 mg | DELAYED_RELEASE_TABLET | Freq: Every day | ORAL | 3 refills | Status: DC

## 2017-01-16 MED ORDER — FUROSEMIDE 20 MG PO TABS
20.0000 mg | ORAL_TABLET | Freq: Every day | ORAL | 3 refills | Status: DC
Start: 2017-01-16 — End: 2018-01-01

## 2017-01-18 DIAGNOSIS — M25551 Pain in right hip: Secondary | ICD-10-CM | POA: Diagnosis not present

## 2017-01-18 DIAGNOSIS — M47816 Spondylosis without myelopathy or radiculopathy, lumbar region: Secondary | ICD-10-CM | POA: Diagnosis not present

## 2017-01-25 DIAGNOSIS — M461 Sacroiliitis, not elsewhere classified: Secondary | ICD-10-CM | POA: Diagnosis not present

## 2017-01-25 DIAGNOSIS — Z6830 Body mass index (BMI) 30.0-30.9, adult: Secondary | ICD-10-CM | POA: Diagnosis not present

## 2017-01-25 DIAGNOSIS — I10 Essential (primary) hypertension: Secondary | ICD-10-CM | POA: Diagnosis not present

## 2017-03-13 DIAGNOSIS — H04123 Dry eye syndrome of bilateral lacrimal glands: Secondary | ICD-10-CM | POA: Diagnosis not present

## 2017-03-13 DIAGNOSIS — H2513 Age-related nuclear cataract, bilateral: Secondary | ICD-10-CM | POA: Diagnosis not present

## 2017-03-15 DIAGNOSIS — G4733 Obstructive sleep apnea (adult) (pediatric): Secondary | ICD-10-CM | POA: Diagnosis not present

## 2017-03-20 DIAGNOSIS — Z6831 Body mass index (BMI) 31.0-31.9, adult: Secondary | ICD-10-CM | POA: Diagnosis not present

## 2017-03-20 DIAGNOSIS — M5126 Other intervertebral disc displacement, lumbar region: Secondary | ICD-10-CM | POA: Diagnosis not present

## 2017-03-20 DIAGNOSIS — M461 Sacroiliitis, not elsewhere classified: Secondary | ICD-10-CM | POA: Diagnosis not present

## 2017-03-20 DIAGNOSIS — M5416 Radiculopathy, lumbar region: Secondary | ICD-10-CM | POA: Diagnosis not present

## 2017-04-03 DIAGNOSIS — M5416 Radiculopathy, lumbar region: Secondary | ICD-10-CM | POA: Diagnosis not present

## 2017-04-03 DIAGNOSIS — Z6830 Body mass index (BMI) 30.0-30.9, adult: Secondary | ICD-10-CM | POA: Diagnosis not present

## 2017-04-03 DIAGNOSIS — I10 Essential (primary) hypertension: Secondary | ICD-10-CM | POA: Diagnosis not present

## 2017-04-27 DIAGNOSIS — E559 Vitamin D deficiency, unspecified: Secondary | ICD-10-CM | POA: Diagnosis not present

## 2017-04-27 DIAGNOSIS — Z0000 Encounter for general adult medical examination without abnormal findings: Secondary | ICD-10-CM | POA: Diagnosis not present

## 2017-04-27 DIAGNOSIS — I25810 Atherosclerosis of coronary artery bypass graft(s) without angina pectoris: Secondary | ICD-10-CM | POA: Diagnosis not present

## 2017-04-27 DIAGNOSIS — F322 Major depressive disorder, single episode, severe without psychotic features: Secondary | ICD-10-CM | POA: Diagnosis not present

## 2017-04-27 DIAGNOSIS — E785 Hyperlipidemia, unspecified: Secondary | ICD-10-CM | POA: Diagnosis not present

## 2017-04-27 DIAGNOSIS — Z79899 Other long term (current) drug therapy: Secondary | ICD-10-CM | POA: Diagnosis not present

## 2017-04-27 DIAGNOSIS — I10 Essential (primary) hypertension: Secondary | ICD-10-CM | POA: Diagnosis not present

## 2017-04-27 DIAGNOSIS — J449 Chronic obstructive pulmonary disease, unspecified: Secondary | ICD-10-CM | POA: Diagnosis not present

## 2017-04-27 DIAGNOSIS — F411 Generalized anxiety disorder: Secondary | ICD-10-CM | POA: Diagnosis not present

## 2017-04-27 DIAGNOSIS — M549 Dorsalgia, unspecified: Secondary | ICD-10-CM | POA: Diagnosis not present

## 2017-05-12 DIAGNOSIS — N390 Urinary tract infection, site not specified: Secondary | ICD-10-CM | POA: Diagnosis not present

## 2017-05-29 ENCOUNTER — Other Ambulatory Visit: Payer: Self-pay | Admitting: *Deleted

## 2017-05-29 DIAGNOSIS — M25551 Pain in right hip: Secondary | ICD-10-CM | POA: Diagnosis not present

## 2017-05-29 MED ORDER — NITROGLYCERIN 0.4 MG SL SUBL: 0.4000 mg | SUBLINGUAL_TABLET | SUBLINGUAL | 0 refills | Status: DC | PRN

## 2017-05-30 DIAGNOSIS — M5416 Radiculopathy, lumbar region: Secondary | ICD-10-CM | POA: Diagnosis not present

## 2017-05-30 DIAGNOSIS — M5126 Other intervertebral disc displacement, lumbar region: Secondary | ICD-10-CM | POA: Diagnosis not present

## 2017-06-06 DIAGNOSIS — M5136 Other intervertebral disc degeneration, lumbar region: Secondary | ICD-10-CM | POA: Diagnosis not present

## 2017-06-06 DIAGNOSIS — M5416 Radiculopathy, lumbar region: Secondary | ICD-10-CM | POA: Diagnosis not present

## 2017-06-12 DIAGNOSIS — F411 Generalized anxiety disorder: Secondary | ICD-10-CM | POA: Diagnosis not present

## 2017-06-12 DIAGNOSIS — G2581 Restless legs syndrome: Secondary | ICD-10-CM | POA: Diagnosis not present

## 2017-06-12 DIAGNOSIS — I739 Peripheral vascular disease, unspecified: Secondary | ICD-10-CM | POA: Diagnosis not present

## 2017-06-12 DIAGNOSIS — M549 Dorsalgia, unspecified: Secondary | ICD-10-CM | POA: Diagnosis not present

## 2017-06-12 DIAGNOSIS — I480 Paroxysmal atrial fibrillation: Secondary | ICD-10-CM | POA: Diagnosis not present

## 2017-06-12 DIAGNOSIS — J449 Chronic obstructive pulmonary disease, unspecified: Secondary | ICD-10-CM | POA: Diagnosis not present

## 2017-06-12 DIAGNOSIS — E559 Vitamin D deficiency, unspecified: Secondary | ICD-10-CM | POA: Diagnosis not present

## 2017-06-12 DIAGNOSIS — F322 Major depressive disorder, single episode, severe without psychotic features: Secondary | ICD-10-CM | POA: Diagnosis not present

## 2017-06-12 DIAGNOSIS — I25810 Atherosclerosis of coronary artery bypass graft(s) without angina pectoris: Secondary | ICD-10-CM | POA: Diagnosis not present

## 2017-06-13 DIAGNOSIS — M5416 Radiculopathy, lumbar region: Secondary | ICD-10-CM | POA: Diagnosis not present

## 2017-06-13 DIAGNOSIS — Z6830 Body mass index (BMI) 30.0-30.9, adult: Secondary | ICD-10-CM | POA: Diagnosis not present

## 2017-06-13 DIAGNOSIS — M5136 Other intervertebral disc degeneration, lumbar region: Secondary | ICD-10-CM | POA: Diagnosis not present

## 2017-06-14 DIAGNOSIS — G4733 Obstructive sleep apnea (adult) (pediatric): Secondary | ICD-10-CM | POA: Diagnosis not present

## 2017-07-03 ENCOUNTER — Ambulatory Visit (INDEPENDENT_AMBULATORY_CARE_PROVIDER_SITE_OTHER): Payer: Medicare HMO | Admitting: Cardiovascular Disease

## 2017-07-03 ENCOUNTER — Other Ambulatory Visit: Payer: Self-pay | Admitting: Internal Medicine

## 2017-07-03 ENCOUNTER — Encounter: Payer: Self-pay | Admitting: Cardiovascular Disease

## 2017-07-03 VITALS — BP 120/82 | HR 57 | Ht 61.0 in | Wt 167.0 lb

## 2017-07-03 DIAGNOSIS — I2510 Atherosclerotic heart disease of native coronary artery without angina pectoris: Secondary | ICD-10-CM

## 2017-07-03 DIAGNOSIS — Z1231 Encounter for screening mammogram for malignant neoplasm of breast: Secondary | ICD-10-CM

## 2017-07-03 DIAGNOSIS — Z7901 Long term (current) use of anticoagulants: Secondary | ICD-10-CM

## 2017-07-03 MED ORDER — ISOSORBIDE MONONITRATE ER 60 MG PO TB24: 60.0000 mg | ORAL_TABLET | ORAL | 2 refills | Status: DC

## 2017-07-03 MED ORDER — ATORVASTATIN CALCIUM 80 MG PO TABS: 80.0000 mg | ORAL_TABLET | Freq: Every day | ORAL | 3 refills | Status: DC

## 2017-07-03 MED ORDER — METOPROLOL TARTRATE 50 MG PO TABS: 50.0000 mg | ORAL_TABLET | Freq: Two times a day (BID) | ORAL | 3 refills | Status: DC

## 2017-07-03 NOTE — Progress Notes (Signed)
Cardiology Office Note   Date:  07/03/2017   ID:  Wendy [name], DOB 05-22-1950, MRN [id number]  PCP:  Josetta Huddle, MD  Cardiologist:   [name], MD   Chief Complaint  Patient presents with  . Coronary Artery Disease  . Hyperlipidemia   Problem List 1. Unstable angina (HCC) - history of CAD s/p CABG SVG-OM, LIMA-LAD, SVG-RCA in 1998, PCI in 2013. She underwent cardiac catheterization here in 07/2013 which showed an atretic LIMA-LAD with no significant disease in the native LAD, patent SVG-OM with patent stent and patent SVG-distRCA.  S/p PCI of native RCA ( within the stent )   2. Paroxysmal Atrial Fibrillation  CHADS2VASC = 4   ( female, age 84, CAD,HTN ) 3. Hypertension -    4. HLD (hyperlipidemia)  5. COPD (chronic obstructive pulmonary disease) ([place])  Dec. 2, 2016:  Wendy [name] is a 67 y.o. female who presents for follow up of recent hospitalization for chest pain, abnormal blood pressure and PCI of the right coronary artery. Since the discharge she's not been feeling well.   After the stent, she felt very well for a week or so . Then she started Eliquis after she got home. Has felt poorly since that  No bleeding in stool. Thinks the Eliquis is causing her headaches, diarrhea, feels poorly all over.   Symptoms sound like flu like to me .  Wants to sleep all the time  Has not been to the medical doctor .  Has been very vatigued - complete lack of energy for the past week.   No CP , breathing is ok  No focal neurological changes.   August 13, 2015:  Wendy [name] is in atrial fib today .   Is having some generalized weakness and dyspnea.  Feels poorly all the time.   Is anemic,  Has been taking all of her meds But did not take them this am  Does not necessarily feel worse when she is in atrial fib   .she feesl poorly all the time ! Is going for colonoscopy soon   02/11/2016:  Is in atrial fib today  Just started today .   Has not seen the A-Fib  clinic  needs to have hip replacement  surgery and back injections or surgery   Oct. 1, 2018  Wendy [name] is seen today for follow up visit Had a recent episode of CP , resolved with 1 SL NTG .  Was admitted in June with NSTEMI - + for cocaine at that time  Still has occasional episodes of CP, takes NTG wit relief. Exercising  1-2 times a week,  Is on Ranexa 1000 PO BID and Imdur 60 mg a day  Smokes marijuana  Frequently  Avoids salty foods for the most part.   July 03, 2017:  Wendy [name] was last seen I Jan 09, 2017  Her daughter died a month ago of respiratory complications - age 15.  Occasional episodes of CP ,  Relieved  Is not smoking  Takes NTG regularly  - perhaps 2-3 times a day , sometimes none .  She is having lots of back problems.  Her surgeon has suggested may be a back stimulator.  And wanted by input on whether or not she would be a candidate for this.  I think that she would be at perhaps moderate risk for cardiovascular complications.  Her angina pattern is very stable and she is not having any worsening angina so I think that the risks  are acceptable at this point.    Past Medical History:  Diagnosis Date  . Anemia   . Anginal pain (HCC)    jaw pain  . Anxiety   . Arthritis   . CAD (coronary artery disease), native coronary artery    a. Hx CABG 1998, last PCI 2013 b.LHC 07/2013:  EF 55-60%, L-LAD prob atretic, no sig disease in LAD, S-OM1 ok with patent stent, S-dRCA ok => med Rx. c. LHC 01/2015 DES to SVG-RCA.  Marland Kitchen Chronic back pain   . Chronic leg pain   . COPD (chronic obstructive pulmonary disease) ([place])   . Fall 08/2016  . GERD (gastroesophageal reflux disease)   . Glucose intolerance (impaired glucose tolerance)   . HLD (hyperlipidemia)   . Hypertension   . Myocardial infarction ([place])   . PAD (peripheral artery disease) ([place])   . PAF (paroxysmal atrial fibrillation) ([place])    a. 09/2013 post-op b. 01/2015  . Peripheral neuropathy   . Shortness of breath     when walking  . Sleep apnea    uses a cpap  . Wears glasses     Past Surgical History:  Procedure Laterality Date  . CARDIAC CATHETERIZATION N/A 01/22/2015   Procedure: Left Heart Cath and Coronary Angiography;  Surgeon: Troy Sine, MD;  Location: [facility];  Service: Cardiovascular;  Laterality: N/A;  . CARDIAC CATHETERIZATION N/A 01/22/2015   Procedure: Coronary Stent Intervention;  Surgeon: Troy Sine, MD;  Location: [facility];  Service: Cardiovascular;  Laterality: N/A;  3.0x12 xience prox SVG to RCA  . CARDIAC SURGERY  1998   LIMA-LAD, SVG-OM, SVG-RCA  . [place] and again several years later  . CHOLECYSTECTOMY  09/17/2013   dr [name]  . CHOLECYSTECTOMY N/A 09/16/2013   Procedure: LAPAROSCOPIC CHOLECYSTECTOMY CONVERTED TO OPEN CHOLECYSTECTOMY WITH CHOLANGIOGRAM;  Surgeon: [name], MD;  Location: [facility];  Service: General;  Laterality: N/A;  . CORONARY ANGIOPLASTY WITH STENT PLACEMENT  11/2011, 02/2012, 01/2015   DES to SVG-RCA both times, In [place], [place], Dr. Bruce Donath  . ERCP N/A 09/19/2013   Procedure: ENDOSCOPIC RETROGRADE CHOLANGIOPANCREATOGRAPHY (ERCP);  Surgeon: [name], MD;  Location: [facility];  Service: Endoscopy;  Laterality: N/A;  . FINGER SURGERY Right    ring finger  . i and d right subcostal wound  10/17/13  . IRRIGATION AND DEBRIDEMENT ABSCESS Right 10/17/2013   Procedure: INCISION AND DRAINAGE RIGHT SUBCOSTAL WOUND;  Surgeon: [name], MD;  Location: [facility];  Service: General;  Laterality: Right;  . KNEE ARTHROSCOPY Right 1998  . LEFT HEART CATH AND CORS/GRAFTS ANGIOGRAPHY N/A 07/25/2016   Procedure: Left Heart Cath and Cors/Grafts Angiography;  Surgeon: Jettie Booze, MD;  Location: [facility];  Service: Cardiovascular;  Laterality: N/A;  . LEFT HEART CATHETERIZATION WITH CORONARY/GRAFT ANGIOGRAM N/A 07/19/2013   Procedure: LEFT HEART CATHETERIZATION WITH Beatrix Fetters;   Surgeon: Leonie Man, MD;  Location: [facility];  Service: Cardiovascular;  Laterality: N/A;  . MASS EXCISION Right 06/25/2014   Procedure: EXCISION BUTTOCK MASS ;  Surgeon: Coralie Keens, MD;  Location: [facility];  Service: General;  Laterality: Right;  . ORIF HUMERUS FRACTURE Left 08/27/2013   Procedure: OPEN REDUCTION INTERNAL FIXATION (ORIF) LEFT HUMERUS ;  Surgeon: Rozanna Box, MD;  Location: [facility];  Service: Orthopedics;  Laterality: Left;  . TOTAL HIP ARTHROPLASTY Right 07/19/2016   Procedure: TOTAL HIP ARTHROPLASTY ANTERIOR APPROACH;  Surgeon: [name], MD;  Location: [facility];  Service: Orthopedics;  Laterality: Right;     Current Outpatient Medications  Medication Sig Dispense Refill  . Acetaminophen 500 MG coapsule Take 500 mg by mouth as needed.    Marland Kitchen albuterol (PROVENTIL HFA;VENTOLIN HFA) 108 (90 BASE) MCG/ACT inhaler Inhale 2 puffs into the lungs every 6 (six) hours as needed for wheezing or shortness of breath.    . ALPRAZolam (XANAX) 0.5 MG tablet Take 0.5 mg by mouth 3 (three) times daily as needed for anxiety.    Marland Kitchen apixaban (ELIQUIS) 5 MG TABS tablet Take 1 tablet (5 mg total) by mouth 2 (two) times daily. 180 tablet 3  . atorvastatin (LIPITOR) 80 MG tablet TAKE 1 TABLET (80 MG TOTAL) BY MOUTH DAILY AT 6 PM. 90 tablet 3  . baclofen (LIORESAL) 20 MG tablet Take 20 mg by mouth 3 (three) times daily as needed for muscle spasms.    . cetirizine (ZYRTEC) 10 MG tablet Take 10 mg by mouth daily as needed for allergies.     . cholecalciferol (VITAMIN D) 1000 units tablet Take 2,000 Units by mouth daily.    . clopidogrel (PLAVIX) 75 MG tablet Take 75 mg by mouth daily.    . diclofenac sodium (VOLTAREN) 1 % GEL Apply 1 g topically daily as needed (pain).    Marland Kitchen ezetimibe (ZETIA) 10 MG tablet Take 10 mg by mouth daily.    . Fish Oil-Cholecalciferol (FISH OIL + D3 PO) Take 1 capsule by mouth daily. 1200 mg fish oil 600 mg Omega 3 2000 IU Vitamin D    .  furosemide (LASIX) 20 MG tablet Take 1 tablet (20 mg total) daily by mouth. 90 tablet 3  . isosorbide mononitrate (IMDUR) 60 MG 24 hr tablet Take 1 tablet (60 mg total) by mouth every morning. 90 tablet 2  . metoprolol (LOPRESSOR) 50 MG tablet Take 1 tablet (50 mg total) by mouth 2 (two) times daily. 180 tablet 3  . nitroGLYCERIN (NITROSTAT) 0.4 MG SL tablet Place 1 tablet (0.4 mg total) under the tongue every 5 (five) minutes as needed for chest pain. 75 tablet 0  . oxyCODONE-acetaminophen (ROXICET) 5-325 MG tablet Take 1 tablet by mouth every 6 (six) hours as needed for severe pain.    . pantoprazole (PROTONIX) 40 MG tablet Take 1 tablet (40 mg total) daily by mouth. 90 tablet 3  . pramipexole (MIRAPEX) 0.25 MG tablet Take 0.25 mg by mouth at bedtime as needed (for restless leg).     . ranolazine (RANEXA) 1000 MG SR tablet Take 1,000 mg by mouth 2 (two) times daily.    . sertraline (ZOLOFT) 100 MG tablet Take 150 mg by mouth daily.     . Triamcinolone Acetonide (NASACORT ALLERGY 24HR NA) Place 2 sprays into the nose as needed.     Current Facility-Administered Medications  Medication Dose Route Frequency Provider Last Rate Last Dose  . ferrous sulfate tablet 325 mg  325 mg Oral BID WC Irene Shipper, MD        Allergies:   Benadryl [diphenhydramine]; Adhesive [tape]; Amoxicillin; Flexeril [cyclobenzaprine]; and Hydrocodone    Social History:  The patient  reports that she quit smoking about 18 years ago. She has never used smokeless tobacco. She reports that she does not drink alcohol or use drugs.   Family History:  The patient's family history includes Clotting disorder in her father; Heart disease in her brother and brother; Lung cancer in her mother  and sister.    ROS:   Noted in current history, otherwise systems are negative.  Physical Exam: Blood pressure 120/82, pulse (!) 57, height 5\' 1"  (1.549 m), weight 167 lb (75.8 kg), SpO2 97 %.  GEN:  Well nourished, well developed in no  acute distress HEENT: Normal NECK: No JVD; No carotid bruits LYMPHATICS: No lymphadenopathy CARDIAC: RRR , no murmurs, rubs, gallops RESPIRATORY:  Clear to auscultation without rales, wheezing or rhonchi  ABDOMEN: Soft, non-tender, non-distended MUSCULOSKELETAL:  No edema; No deformity  SKIN: Warm and dry NEUROLOGIC:  Alert and oriented x 3   EKG: July 03, 2017: Sinus bradycardia 57 beats a minute.  Nonspecific T wave abnormality.  No acute changes.   Recent Labs: 07/22/2016: B Natriuretic Peptide 954.4 08/15/2016: TSH 0.295 08/16/2016: Hemoglobin 9.8; Magnesium 1.6; Platelets 227 12/05/2016: ALT 8; BUN 9; Creatinine, Ser 0.61; Potassium 4.0; Sodium 142    Lipid Panel    Component Value Date/Time   CHOL 98 (L) 12/05/2016 1139   TRIG 73 12/05/2016 1139   HDL 43 12/05/2016 1139   CHOLHDL 2.3 12/05/2016 1139   CHOLHDL 4.1 08/13/2015 0939   VLDL 48 (H) 08/13/2015 0939   LDLCALC 40 12/05/2016 1139      Wt Readings from Last 3 Encounters:  07/03/17 167 lb (75.8 kg)  12/05/16 156 lb 3.2 oz (70.9 kg)  08/22/16 162 lb 12.8 oz (73.8 kg)      Other studies Reviewed: Additional studies/ records that were reviewed today include: . Review of the above records demonstrates:    ASSESSMENT AND PLAN:  1. Coronary artery disease:  Cath in May, 2018 shows Attretic LIMA to LAD, occluded SVG to OM, occluded SVG to PDA.  She has  severe coronary artery disease.    Her angina pattern is very stable.  She may need to have a stimulator placed in her back for chronic pain.  I think that she would be at acceptable risk.  I would put her risk assessment at moderate risk for CV complications.  2. Paroxysmal atrial fibrillation:     Stable.  Is maintaining normal sinus rhythm.   Current medicines are reviewed at length with the patient today.  The patient does not have concerns regarding medicines.  The following changes have been made:  no change  Labs/ tests ordered today include:  No  orders of the defined types were placed in this encounter.   Disposition:   FU with me in 1 year    [name], MD  07/03/2017 11:59 AM    [address], [place], [place]  [address] Phone: [phone]; Fax: [phone]

## 2017-07-03 NOTE — Patient Instructions (Addendum)
Medication Instructions:  Your physician recommends that you continue on your current medications as directed. Please refer to the Current Medication list given to you today.   Labwork: TODAY - CBC, BMET   Testing/Procedures: None Ordered   Follow-Up: Your physician wants you to follow-up in: 6 months with Pecolia Ades, NP. You will receive a reminder letter in the mail two months in advance. If you don't receive a letter, please call our office to schedule the follow-up appointment.   If you need a refill on your cardiac medications before your next appointment, please call your pharmacy.   Thank you for choosing [facility]! Christen Bame, RN [phone]

## 2017-07-04 ENCOUNTER — Encounter: Payer: Self-pay | Admitting: Cardiovascular Disease

## 2017-07-04 LAB — BASIC METABOLIC PANEL
BUN/Creatinine Ratio: 15 (ref 12–28)
BUN: 13 mg/dL (ref 8–27)
CO2: 25 mmol/L (ref 20–29)
Calcium: 9.3 mg/dL (ref 8.7–10.3)
Chloride: 108 mmol/L — ABNORMAL HIGH (ref 96–106)
Creatinine, Ser: 0.88 mg/dL (ref 0.57–1.00)
GFR calc Af Amer: 80 mL/min/{1.73_m2} (ref >59)
GFR calc non Af Amer: 69 mL/min/{1.73_m2} (ref >59)
Glucose: 89 mg/dL (ref 65–99)
Potassium: 4.8 mmol/L (ref 3.5–5.2)
Sodium: 146 mmol/L — ABNORMAL HIGH (ref 134–144)

## 2017-07-04 LAB — CBC
Hematocrit: 35.9 % (ref 34.0–46.6)
Hemoglobin: 11.3 g/dL (ref 11.1–15.9)
MCH: 26.9 pg (ref 26.6–33.0)
MCHC: 31.5 g/dL (ref 31.5–35.7)
MCV: 86 fL (ref 79–97)
Platelets: 235 10*3/uL (ref 150–379)
RBC: 4.2 x10E6/uL (ref 3.77–5.28)
RDW: 18.9 % — ABNORMAL HIGH (ref 12.3–15.4)
WBC: 6.8 10*3/uL (ref 3.4–10.8)

## 2017-07-25 ENCOUNTER — Ambulatory Visit: Payer: Medicare HMO

## 2017-08-14 DIAGNOSIS — L03115 Cellulitis of right lower limb: Secondary | ICD-10-CM | POA: Diagnosis not present

## 2017-08-14 DIAGNOSIS — M549 Dorsalgia, unspecified: Secondary | ICD-10-CM | POA: Diagnosis not present

## 2017-08-16 DIAGNOSIS — Z01812 Encounter for preprocedural laboratory examination: Secondary | ICD-10-CM | POA: Diagnosis not present

## 2017-08-16 DIAGNOSIS — T8451XA Infection and inflammatory reaction due to internal right hip prosthesis, initial encounter: Secondary | ICD-10-CM

## 2017-08-16 DIAGNOSIS — M25551 Pain in right hip: Secondary | ICD-10-CM | POA: Diagnosis not present

## 2017-08-16 NOTE — Pre-Procedure Instructions (Signed)
NATAHSA MARIAN  08/16/2017      [id number] - [place], [place] - [address] [place] [address] Phone: [phone] Fax: [phone]  [id number] - [place], [place] [address]  [place] [address] Phone: [phone] Fax: [facility] [address] [place] [address] Phone: [phone] Fax: [phone]    Your procedure is scheduled on June 18  Report to [facility] at [phone] A.M.  Call this number if you have problems the morning of surgery:  [phone]   Remember:  NOTHING TO EAT OR DRINK AFTER MIDNIGHT    Take these medicines the morning of surgery with A SIP OF WATER  Acetaminophen  albuterol (PROVENTIL HFA;VENTOLIN HFA) - bring the morning of surgery ALPRAZolam (XANAX)  baclofen (LIORESAL) cetirizine (ZYRTEC)   isosorbide mononitrate (IMDUR) metoprolol tartrate (LOPRESSOR)  pantoprazole (PROTONIX)  sertraline (ZOLOFT)   7 days prior to surgery STOP taking any Aspirin(unless otherwise instructed by your surgeon), Aleve, Naproxen, Ibuprofen, Motrin, Advil, Goody's, BC's, all herbal medications, fish oil, and all vitamins  Follow PHYSICIANS INSTRUCTIONS ABOUT ELIQUIS AND PLAVIX    Do not wear jewelry, make-up or nail polish.  Do not wear lotions, powders, or perfumes, or deodorant.  Do not shave 48 hours prior to surgery.    Do not bring valuables to the hospital.  [facility] is not responsible for any belongings or valuables.  Contacts, dentures or bridgework may not be worn into surgery.  Leave your suitcase in the car.  After surgery it may be brought to your room.  For patients admitted to the hospital, discharge time will be determined by your treatment team.  Patients discharged the day of surgery will not be allowed to drive home.     Special instructions:   [facility]- Preparing For Surgery  Before surgery, you can play an important role. Because skin is not sterile, your skin needs to be as free of germs as possible. You can reduce the number of germs on your skin by washing with CHG (chlorahexidine gluconate) Soap before surgery.  CHG is an antiseptic cleaner which kills germs and bonds with the skin to continue killing germs even after washing.    Oral Hygiene is also important to reduce your risk of infection.  Remember - BRUSH YOUR TEETH THE MORNING OF SURGERY WITH YOUR REGULAR TOOTHPASTE  Please do not use if you have an allergy to CHG or antibacterial soaps. If your skin becomes reddened/irritated stop using the CHG.  Do not shave (including legs and underarms) for at least 48 hours prior to first CHG shower. It is OK to shave your face.  Please follow these instructions carefully.   1. Shower the NIGHT BEFORE SURGERY and the MORNING OF SURGERY with CHG.   2. If you chose to wash your hair, wash your hair first as usual with your normal shampoo.  3. After you shampoo, rinse your hair and body thoroughly to remove the shampoo.  4. Use CHG as you would any other liquid soap. You can apply CHG directly to the skin and wash gently with a scrungie or a clean washcloth.   5. Apply the CHG Soap to your body ONLY FROM THE NECK DOWN.  Do not use on open wounds or open sores. Avoid contact with your eyes,  ears, mouth and genitals (private parts). Wash Face and genitals (private parts)  with your normal soap.  6. Wash thoroughly, paying special attention to the area where your surgery will be performed.  7. Thoroughly rinse your body with warm water from the neck down.  8. DO NOT shower/wash with your normal soap after using and rinsing off the CHG Soap.  9. Pat yourself dry with a CLEAN TOWEL.  10. Wear CLEAN PAJAMAS to bed the night before surgery, wear comfortable clothes the morning of surgery  11. Place  CLEAN SHEETS on your bed the night of your first shower and DO NOT SLEEP WITH PETS.    Day of Surgery:  Do not apply any deodorants/lotions.  Please wear clean clothes to the hospital/surgery center.   Remember to brush your teeth WITH YOUR REGULAR TOOTHPASTE.    Please read over the following fact sheets that you were given.

## 2017-08-17 ENCOUNTER — Inpatient Hospital Stay (HOSPITAL_COMMUNITY)
Admission: RE | Admit: 2017-08-17 | Discharge: 2017-08-17 | Disposition: A | Discharge status: Home / Self Care | Payer: Medicare HMO | Source: Ambulatory Visit

## 2017-08-17 NOTE — H&P (Signed)
[facility] [address] [place], [place] [address] [phone] A Division of [facility]  RE: Wendy [name], Wendy [name]                                  [id number]         DOB: 08-Aug-1950  08/16/2017  Reason for visit:  Right hip pain and redness.   HPI: She is a 67 year old woman who had a right total hip arthroplasty on 07/22/2017.  She noticed on Thursday some pain in her hip and redness in her incision.  Overall she has not felt ill.    OBJECTIVE: The patient is a well appearing female, in no apparent distress.  The right lower extremity has distal redness of the incision with some fullness here.  She is able to bear weight with minimal antalgic gait.  She has a temperature of 97.8.  She is neurovascularly intact.  No instability of her hip.    Ultrasound exam demonstrates fluid collection tracking to her hip joint.  Ultrasound guided aspiration demonstrated 15 cc of bloody purulent fluid.    IMAGES: X-rays show stable total hip arthroplasty.  No signs of loosening.    ASSESSMENT/PLAN:  I am concerned for an acute infection.  This started at the end of last week.  We had a long discussion about her options.  At this time I would recommend she undergo an aspiration, which we have done.  I also recommend we obtain systemic labs.  We had a long discussion about her options.  I do think she is going to need a surgical washout of this infection.  I would recommend we do an extensive debridement of her abscess followed by headliner exchange.  Depending on the bacteria, there is a reasonable chance of this being successful given that it is an acute infection.  She understands that if it is not successful we may have to perform a formal complete hardware removal.  In light of that she would like to go forward initially with attempted headliner exchange and debridement.  We will have her admitted.   She will get a PICC line.  ID  consult as well.  We will set this up within the week.    Ernesta Amble.  [name], M.D. Electronically verified by Ernesta Amble. [name], M.D. TDM:pmw D 08/16/17 T 08/16/17

## 2017-08-17 NOTE — Progress Notes (Addendum)
Called and spoke with patient's caregiver, the patient is currently asleep and will not be attending her pre-op appointment today.  Advised caregiver that someone would be calling to reschedule her pre-op appoitment. She verbalized understanding

## 2017-08-18 ENCOUNTER — Encounter (HOSPITAL_COMMUNITY)
Admission: RE | Admit: 2017-08-18 | Discharge: 2017-08-18 | Disposition: A | Discharge status: Home / Self Care | Payer: Medicare HMO | Source: Ambulatory Visit | Attending: Orthopedic Surgery | Admitting: Orthopedic Surgery

## 2017-08-18 ENCOUNTER — Other Ambulatory Visit: Payer: Self-pay

## 2017-08-18 DIAGNOSIS — Z01812 Encounter for preprocedural laboratory examination: Secondary | ICD-10-CM | POA: Insufficient documentation

## 2017-08-18 LAB — PROTIME-INR
INR: 1.42
Prothrombin Time: 17.3 seconds — ABNORMAL HIGH (ref 11.4–15.2)

## 2017-08-18 LAB — BASIC METABOLIC PANEL
Anion gap: 9 (ref 5–15)
BUN: 16 mg/dL (ref 6–20)
CO2: 28 mmol/L (ref 22–32)
Calcium: 8.9 mg/dL (ref 8.9–10.3)
Chloride: 104 mmol/L (ref 101–111)
Creatinine, Ser: 0.74 mg/dL (ref 0.44–1.00)
GFR calc Af Amer: >60 mL/min (ref >60)
GFR calc non Af Amer: >60 mL/min (ref >60)
Glucose, Bld: 95 mg/dL (ref 65–99)
Potassium: 3.9 mmol/L (ref 3.5–5.1)
Sodium: 141 mmol/L (ref 135–145)

## 2017-08-18 LAB — CBC
HCT: 35.6 % — ABNORMAL LOW (ref 36.0–46.0)
Hemoglobin: 10.8 g/dL — ABNORMAL LOW (ref 12.0–15.0)
MCH: 26.6 pg (ref 26.0–34.0)
MCHC: 30.3 g/dL (ref 30.0–36.0)
MCV: 87.7 fL (ref 78.0–100.0)
Platelets: 208 10*3/uL (ref 150–400)
RBC: 4.06 MIL/uL (ref 3.87–5.11)
RDW: 15.6 % — ABNORMAL HIGH (ref 11.5–15.5)
WBC: 6.2 10*3/uL (ref 4.0–10.5)

## 2017-08-18 LAB — SURGICAL PCR SCREEN
MRSA, PCR: NEGATIVE
Staphylococcus aureus: NEGATIVE

## 2017-08-18 NOTE — Progress Notes (Addendum)
PCP is Dr. America Brown  LOV 08/2017 Cardio is Dr. [name]  LOV 2019 She denies any cardiac issues or concerns.  Nothing has changed from last yr. She has been instructed by Dr. Jamison Oka' office to stopping Plavix on 6/14 and Eliquis 6/15. Have attempted to contact pharmacy so her med list can be "completed".  I have called both the pager (x3) and the office #  [phone] with no success.

## 2017-08-21 ENCOUNTER — Encounter (HOSPITAL_COMMUNITY): Payer: Self-pay

## 2017-08-21 MED ORDER — TRANEXAMIC ACID 1000 MG/10ML IV SOLN
2000.0000 mg | Freq: Once | INTRAVENOUS | Status: AC
  Administered 2017-08-22: 2000 mg via TOPICAL
  Filled 2017-08-21: qty 20

## 2017-08-21 MED ORDER — TRANEXAMIC ACID 1000 MG/10ML IV SOLN
1000.0000 mg | INTRAVENOUS | Status: AC
  Administered 2017-08-22: 1000 mg via INTRAVENOUS
  Filled 2017-08-21: qty 1100

## 2017-08-21 NOTE — Progress Notes (Signed)
Anesthesia Chart Review:  Case:  [id number] Date/Time:  08/22/17 1200   Procedure:  TOTAL HIP REVISION (Right )   Anesthesia type:  Choice   Pre-op diagnosis:  infected right total hip   Location:  [facility] OR ROOM 05 / [place]   Surgeon:  Wendy Butters, MD      DISCUSSION: Patient is a 67 year old female scheduled for the above procedure. She is s/p right THA 07/19/16 (readmitted 07/22/16 with chest pain, elevated troponin thought due to HF, 07/25/16 revealed new SVG-RCA occlusion, but not felt to be acute, medical therapy recommended). Unfortunately, she was seen on 08/16/17 with pain and redness at her incision site. Ultrasound guided aspiration demonstrated bloody purulent fluid concerning for acute infection. Dr. Percell [name] recommended surgical washout and extensive debridement of her abscess followed by headliner exchange. He plans on PICC line and ID consult.   History includes former smoker (quit '00), COPD, MI, CAD s/p CABG (LIMA-LAD, SVG-OM, SVG-RCA) '88 s/p SVG-OM stent nd SVG-RCA stent "13 ([place]) with atretic LIMA-LAD (but patent LAD) '15 and DES to SVG-RCA 01/2015 (occluded SVG-RCA 07/2016), stable angina (on Ranexa, Imdur, nitro, metoprolol), PAF (09/2013 post-op, 01/2015, 08/2015), HTN, PAD, peripheral neuropathy, impaired glucose tolerance, HLD, exertional dyspnea, GERD, OSA (CPAP), anemia.   Dr. Percell [name] reportedly instructed her to hold Plavix starting 08/18/17 and Eliquis 08/19/17. As above, she has severe CAD with known atretic LIMA-LAD and occluded vein grafts to OM and RCA. She has known stable angina, currently controlled on medical therapy. She was last evaluated by Dr. Acie [name] on 07/03/17 and was felt "moderate risk" for lumbar stimulator, which I don't believe she ever had. Reviewed above with anesthesiologist Dr. Oren [name]. Anesthesiologist to evaluate on the day of surgery to assess for any new changes.   VS: BP (!) 160/70   Pulse (!) 58   Temp 36.4 C (Oral)   Resp 18   Ht 5'  1" (1.549 m)   Wt 164 lb 3 oz (74.5 kg)   SpO2 95%   BMI 31.02 kg/m   PROVIDERS: Wendy Huddle, MD is PCP Wendy Moores, MD is cardiologist. At that time, he was considering placement of a back stimulator. He wrote, "I think that she would be at perhaps moderate risk for cardiovascular complications.  Her angina pattern is very stable and she is not having any worsening angina so I think that the risks are acceptable at this point." One year follow-up recommended.    LABS: Labs reviewed: Acceptable for surgery. and Labs reviewed: Repeat PT/INR on the day of surgery since PT elevated at 17.4. Eliquis stop date 08/19/17, Plavix 08/18/17.  (all labs ordered are listed, but only abnormal results are displayed)  Labs Reviewed  CBC - Abnormal; Notable for the following components:      Result Value   Hemoglobin 10.8 (*)    HCT 35.6 (*)    RDW 15.6 (*)    All other components within normal limits  PROTIME-INR - Abnormal; Notable for the following components:   Prothrombin Time 17.3 (*)    All other components within normal limits  SURGICAL PCR SCREEN  BASIC METABOLIC PANEL  TYPE AND SCREEN    EKG: 07/03/17: SB at 57 bpm, non-specific T wave abnormality.   CV: Cardiac cath 07/25/16 (Dr. Irish [name]):  Severe three-vessel coronary artery disease.  Unable to gain access from the left common femoral. It appears there is a left to right fem-fem graft based on the wire course after needle access to  the vessel.  Occluded SVG to marginal, known from prior. LIMA to LAD known to be atretic from prior cath.  SVG to PDA occluded. This is a new finding.  Mid LAD lesion, 50 %stenosed. This is an ulcerated, irregular lesion. There are left to right collaterals.  The left ventricular ejection fraction is 25-35% by visual estimate.  There is moderate to severe left ventricular systolic dysfunction.  LV end diastolic pressure is moderately elevated.  There is no aortic valve stenosis.  Of note,  poor radial pulses bilaterally. Right brachial artery was accessed using ultrasound guidance. I suspect her troponin elevation is from heart failure. It does not have a typical ACS pattern. I don't think her SVG to RCA occlusion is acute. Continue medical therapy for heart failure. Consider viability study. If inferior wall is viable, could consider cardiac surgery consultation for repeat CABG. (According to 12/05/16 notes by Dr. Acie [name], "She has very severe coronary artery disease. Her angina pattern sounds stable. I do not think that that it would be advantageous to do a stress myoview - it certainly would be abnormal . Advised her to stay away from smoking cigarettes and smoking or Wendy [name]; marijuana]. I've also advised her to avoid other substances ( cocaine) that could contribute to angina and coronary artery disease. At this point she is on isosorbide and on Ranexa.  She should continue to take the nitroglycerin as needed. If the pain becomes too severe and does not resolve I've advised her to go to the emergency room.")  Echo 07/23/16: Study Conclusions - Left ventricle: The cavity size was normal. Wall thickness was   normal. Systolic function was normal. The estimated ejection   fraction was in the range of 60% to 65%. Wall motion was normal;   there were no regional wall motion abnormalities. The study is   not technically sufficient to allow evaluation of LV diastolic   function. - Aortic valve: Mildly calcified annulus. Trileaflet. - Mitral valve: Calcified annulus. There was trivial regurgitation. - Right atrium: Central venous pressure (est): 3 mm Hg. - Tricuspid valve: There was trivial regurgitation. - Pulmonary arteries: PA peak pressure: 24 mm Hg (S). - Pericardium, extracardiac: There was no pericardial effusion. Impressions: - Normal LV wall thickness with LVEF 60-65%. Indeterminate   diastolic function. Mitral annular calcification with trivial   mitral regurgitation.  Trivial tricuspid regurgitation with PASP   24 mmHg.  Nuclear stress test 07/15/16:  Nuclear stress EF: 48%.  EKG nondiagnostic due to baseline changes  Very small region of distal anterior ischemia, cannot exclude shifting soft tissue Inferior / inferolateral defect consistent with soft tissue attenuation and/or scar  Intermediate risk study.   Past Medical History:  Diagnosis Date  . Anemia   . Anginal pain ([place])    jaw pain 07/2016, s/p 07/15/16 nuclear stress test  . Anxiety   . Arthritis   . CAD (coronary artery disease), native coronary artery    a. Hx CABG 1998, last PCI 2013 b.LHC 07/2013:  EF 55-60%, L-LAD prob atretic, no sig disease in LAD, S-OM1 ok with patent stent, S-dRCA ok => med Rx. c. LHC 01/2015 DES to SVG-RCA.  Marland Kitchen Chronic back pain   . Chronic leg pain   . COPD (chronic obstructive pulmonary disease) ([place])   . Fall 08/2016  . GERD (gastroesophageal reflux disease)   . Glucose intolerance (impaired glucose tolerance)   . HLD (hyperlipidemia)   . Hypertension   . Myocardial infarction ([place])   . PAD (  peripheral artery disease) ([place])   . PAF (paroxysmal atrial fibrillation) ([place])    a. 09/2013 post-op b. 01/2015  . Peripheral neuropathy   . Shortness of breath    when walking  . Sleep apnea    uses a cpap  . Wears glasses     Past Surgical History:  Procedure Laterality Date  . CARDIAC CATHETERIZATION N/A 01/22/2015   Procedure: Left Heart Cath and Coronary Angiography;  Surgeon: Troy Sine, MD;  Location: [facility] [facility];  Service: Cardiovascular;  Laterality: N/A;  . CARDIAC CATHETERIZATION N/A 01/22/2015   Procedure: Coronary Stent Intervention;  Surgeon: Troy Sine, MD;  Location: [facility];  Service: Cardiovascular;  Laterality: N/A;  3.0x12 xience prox SVG to RCA  . CARDIAC SURGERY  1998   LIMA-LAD, SVG-OM, SVG-RCA  . [place] and again several years later  . CHOLECYSTECTOMY  09/17/2013   dr [name]  .  CHOLECYSTECTOMY N/A 09/16/2013   Procedure: LAPAROSCOPIC CHOLECYSTECTOMY CONVERTED TO OPEN CHOLECYSTECTOMY WITH CHOLANGIOGRAM;  Surgeon: [name], MD;  Location: [place] [facility];  Service: General;  Laterality: N/A;  . CORONARY ANGIOPLASTY WITH STENT PLACEMENT  11/2011, 02/2012, 01/2015   DES to SVG-RCA both times, In [place], [place], Dr. Bruce Donath  . ERCP N/A 09/19/2013   Procedure: ENDOSCOPIC RETROGRADE CHOLANGIOPANCREATOGRAPHY (ERCP);  Surgeon: [name], MD;  Location: [facility];  Service: Endoscopy;  Laterality: N/A;  . FINGER SURGERY Right    ring finger  . i and d right subcostal wound  10/17/13  . IRRIGATION AND DEBRIDEMENT ABSCESS Right 10/17/2013   Procedure: INCISION AND DRAINAGE RIGHT SUBCOSTAL WOUND;  Surgeon: [name], MD;  Location: [facility];  Service: General;  Laterality: Right;  . KNEE ARTHROSCOPY Right 1998  . LEFT HEART CATH AND CORS/GRAFTS ANGIOGRAPHY N/A 07/25/2016   Procedure: Left Heart Cath and Cors/Grafts Angiography;  Surgeon: Jettie Booze, MD;  Location: [facility];  Service: Cardiovascular;  Laterality: N/A;  . LEFT HEART CATHETERIZATION WITH CORONARY/GRAFT ANGIOGRAM N/A 07/19/2013   Procedure: LEFT HEART CATHETERIZATION WITH Beatrix Fetters;  Surgeon: Leonie Man, MD;  Location: [facility];  Service: Cardiovascular;  Laterality: N/A;  . MASS EXCISION Right 06/25/2014   Procedure: EXCISION BUTTOCK MASS ;  Surgeon: Coralie Keens, MD;  Location: [place];  Service: General;  Laterality: Right;  . ORIF HUMERUS FRACTURE Left 08/27/2013   Procedure: OPEN REDUCTION INTERNAL FIXATION (ORIF) LEFT HUMERUS ;  Surgeon: Rozanna Box, MD;  Location: [facility];  Service: Orthopedics;  Laterality: Left;  . TOTAL HIP ARTHROPLASTY Right 07/19/2016   Procedure: TOTAL HIP ARTHROPLASTY ANTERIOR APPROACH;  Surgeon: Wendy Butters, MD;  Location: [place];  Service: Orthopedics;  Laterality: Right;    MEDICATIONS: . Acetaminophen 500  MG coapsule  . albuterol (PROVENTIL HFA;VENTOLIN HFA) 108 (90 BASE) MCG/ACT inhaler  . ALPRAZolam (XANAX) 0.5 MG tablet  . apixaban (ELIQUIS) 5 MG TABS tablet  . atorvastatin (LIPITOR) 80 MG tablet  . baclofen (LIORESAL) 20 MG tablet  . cetirizine (ZYRTEC) 10 MG tablet  . cholecalciferol (VITAMIN D) 1000 units tablet  . clopidogrel (PLAVIX) 75 MG tablet  . diclofenac sodium (VOLTAREN) 1 % GEL  . ezetimibe (ZETIA) 10 MG tablet  . Fish Oil-Cholecalciferol (FISH OIL + D3 PO)  . furosemide (LASIX) 20 MG tablet  . isosorbide mononitrate (IMDUR) 60 MG 24 hr tablet  . metoprolol tartrate (LOPRESSOR) 50 MG tablet  . nitroGLYCERIN (NITROSTAT) 0.4 MG  SL tablet  . oxyCODONE-acetaminophen (ROXICET) 5-325 MG tablet  . pantoprazole (PROTONIX) 40 MG tablet  . pramipexole (MIRAPEX) 0.25 MG tablet  . ranolazine (RANEXA) 1000 MG SR tablet  . sertraline (ZOLOFT) 100 MG tablet  . Triamcinolone Acetonide (NASACORT ALLERGY 24HR NA)   . ferrous sulfate tablet 325 mg   . [START ON 08/22/2017] tranexamic acid (CYKLOKAPRON) 1,000 mg in sodium chloride 0.9 % 100 mL IVPB  . [START ON 08/22/2017] tranexamic acid (CYKLOKAPRON) 2,000 mg in sodium chloride 0.9 % 50 mL Topical Application    George Hugh [facility] Short Stay Center/Anesthesiology Phone [phone] 08/21/2017 10:24 AM

## 2017-08-22 ENCOUNTER — Inpatient Hospital Stay (HOSPITAL_COMMUNITY): Payer: Medicare HMO | Admitting: Emergency Medicine

## 2017-08-22 ENCOUNTER — Inpatient Hospital Stay (HOSPITAL_COMMUNITY): Payer: Medicare HMO

## 2017-08-22 ENCOUNTER — Inpatient Hospital Stay (HOSPITAL_COMMUNITY): Payer: Medicare HMO | Admitting: Certified Registered Nurse Anesthetist

## 2017-08-22 ENCOUNTER — Inpatient Hospital Stay: Payer: Self-pay

## 2017-08-22 ENCOUNTER — Encounter (HOSPITAL_COMMUNITY): Payer: Self-pay | Admitting: Surgery

## 2017-08-22 ENCOUNTER — Other Ambulatory Visit: Payer: Self-pay

## 2017-08-22 ENCOUNTER — Encounter (HOSPITAL_COMMUNITY)
Admission: RE | Disposition: A | Discharge status: Home Health | Payer: Self-pay | Source: Ambulatory Visit | Attending: Orthopedic Surgery

## 2017-08-22 ENCOUNTER — Inpatient Hospital Stay (HOSPITAL_COMMUNITY)
Admission: RE | Admit: 2017-08-22 | Discharge: 2017-08-25 | DRG: 466 | Disposition: A | Discharge status: Home Health | Payer: Medicare HMO | Source: Ambulatory Visit | Attending: Orthopedic Surgery | Admitting: Orthopedic Surgery

## 2017-08-22 DIAGNOSIS — D62 Acute posthemorrhagic anemia: Secondary | ICD-10-CM | POA: Diagnosis not present

## 2017-08-22 DIAGNOSIS — G8929 Other chronic pain: Secondary | ICD-10-CM | POA: Diagnosis present

## 2017-08-22 DIAGNOSIS — G4733 Obstructive sleep apnea (adult) (pediatric): Secondary | ICD-10-CM | POA: Diagnosis present

## 2017-08-22 DIAGNOSIS — I739 Peripheral vascular disease, unspecified: Secondary | ICD-10-CM | POA: Diagnosis present

## 2017-08-22 DIAGNOSIS — J449 Chronic obstructive pulmonary disease, unspecified: Secondary | ICD-10-CM | POA: Diagnosis not present

## 2017-08-22 DIAGNOSIS — Z96641 Presence of right artificial hip joint: Secondary | ICD-10-CM | POA: Diagnosis not present

## 2017-08-22 DIAGNOSIS — E785 Hyperlipidemia, unspecified: Secondary | ICD-10-CM | POA: Diagnosis present

## 2017-08-22 DIAGNOSIS — Z955 Presence of coronary angioplasty implant and graft: Secondary | ICD-10-CM | POA: Diagnosis not present

## 2017-08-22 DIAGNOSIS — I5041 Acute combined systolic (congestive) and diastolic (congestive) heart failure: Secondary | ICD-10-CM | POA: Diagnosis not present

## 2017-08-22 DIAGNOSIS — Z7901 Long term (current) use of anticoagulants: Secondary | ICD-10-CM

## 2017-08-22 DIAGNOSIS — I25718 Atherosclerosis of autologous vein coronary artery bypass graft(s) with other forms of angina pectoris: Secondary | ICD-10-CM | POA: Diagnosis not present

## 2017-08-22 DIAGNOSIS — Z9861 Coronary angioplasty status: Secondary | ICD-10-CM

## 2017-08-22 DIAGNOSIS — I2510 Atherosclerotic heart disease of native coronary artery without angina pectoris: Secondary | ICD-10-CM | POA: Diagnosis present

## 2017-08-22 DIAGNOSIS — M545 Low back pain: Secondary | ICD-10-CM | POA: Diagnosis not present

## 2017-08-22 DIAGNOSIS — G629 Polyneuropathy, unspecified: Secondary | ICD-10-CM | POA: Diagnosis present

## 2017-08-22 DIAGNOSIS — T8451XD Infection and inflammatory reaction due to internal right hip prosthesis, subsequent encounter: Secondary | ICD-10-CM | POA: Diagnosis not present

## 2017-08-22 DIAGNOSIS — I25119 Atherosclerotic heart disease of native coronary artery with unspecified angina pectoris: Secondary | ICD-10-CM | POA: Diagnosis present

## 2017-08-22 DIAGNOSIS — I480 Paroxysmal atrial fibrillation: Secondary | ICD-10-CM | POA: Diagnosis present

## 2017-08-22 DIAGNOSIS — I252 Old myocardial infarction: Secondary | ICD-10-CM

## 2017-08-22 DIAGNOSIS — I10 Essential (primary) hypertension: Secondary | ICD-10-CM | POA: Diagnosis present

## 2017-08-22 DIAGNOSIS — Z951 Presence of aortocoronary bypass graft: Secondary | ICD-10-CM

## 2017-08-22 DIAGNOSIS — I110 Hypertensive heart disease with heart failure: Secondary | ICD-10-CM | POA: Diagnosis present

## 2017-08-22 DIAGNOSIS — M549 Dorsalgia, unspecified: Secondary | ICD-10-CM

## 2017-08-22 DIAGNOSIS — T8451XA Infection and inflammatory reaction due to internal right hip prosthesis, initial encounter: Secondary | ICD-10-CM | POA: Diagnosis not present

## 2017-08-22 DIAGNOSIS — Z96649 Presence of unspecified artificial hip joint: Secondary | ICD-10-CM

## 2017-08-22 DIAGNOSIS — Z419 Encounter for procedure for purposes other than remedying health state, unspecified: Secondary | ICD-10-CM

## 2017-08-22 DIAGNOSIS — Z978 Presence of other specified devices: Secondary | ICD-10-CM | POA: Diagnosis not present

## 2017-08-22 DIAGNOSIS — Z79899 Other long term (current) drug therapy: Secondary | ICD-10-CM | POA: Diagnosis not present

## 2017-08-22 DIAGNOSIS — Y831 Surgical operation with implant of artificial internal device as the cause of abnormal reaction of the patient, or of later complication, without mention of misadventure at the time of the procedure: Secondary | ICD-10-CM | POA: Diagnosis present

## 2017-08-22 DIAGNOSIS — M71051 Abscess of bursa, right hip: Secondary | ICD-10-CM | POA: Diagnosis not present

## 2017-08-22 DIAGNOSIS — Z471 Aftercare following joint replacement surgery: Secondary | ICD-10-CM | POA: Diagnosis not present

## 2017-08-22 DIAGNOSIS — K219 Gastro-esophageal reflux disease without esophagitis: Secondary | ICD-10-CM | POA: Diagnosis present

## 2017-08-22 DIAGNOSIS — Z91040 Latex allergy status: Secondary | ICD-10-CM

## 2017-08-22 DIAGNOSIS — I5043 Acute on chronic combined systolic (congestive) and diastolic (congestive) heart failure: Secondary | ICD-10-CM | POA: Diagnosis present

## 2017-08-22 DIAGNOSIS — M1610 Unilateral primary osteoarthritis, unspecified hip: Secondary | ICD-10-CM | POA: Diagnosis present

## 2017-08-22 DIAGNOSIS — D509 Iron deficiency anemia, unspecified: Secondary | ICD-10-CM

## 2017-08-22 DIAGNOSIS — G609 Hereditary and idiopathic neuropathy, unspecified: Secondary | ICD-10-CM | POA: Diagnosis not present

## 2017-08-22 HISTORY — PX: TOTAL HIP REVISION: SHX763

## 2017-08-22 HISTORY — DX: Headache, unspecified: R51.9

## 2017-08-22 HISTORY — DX: Low back pain, unspecified: M54.50

## 2017-08-22 HISTORY — DX: Obstructive sleep apnea (adult) (pediatric): G47.33

## 2017-08-22 HISTORY — DX: Acute embolism and thrombosis of unspecified deep veins of unspecified lower extremity: I82.409

## 2017-08-22 HISTORY — DX: Dependence on other enabling machines and devices: Z99.89

## 2017-08-22 HISTORY — DX: Low back pain: M54.5

## 2017-08-22 HISTORY — DX: Major depressive disorder, single episode, unspecified: F32.9

## 2017-08-22 HISTORY — DX: Depression, unspecified: F32.A

## 2017-08-22 HISTORY — DX: Headache: R51

## 2017-08-22 HISTORY — DX: Other chronic pain: G89.29

## 2017-08-22 LAB — PROTIME-INR
INR: 1.13
Prothrombin Time: 14.4 seconds (ref 11.4–15.2)

## 2017-08-22 SURGERY — TOTAL HIP REVISION
Anesthesia: General | Site: Hip | Laterality: Right

## 2017-08-22 MED ORDER — ALPRAZOLAM 0.5 MG PO TABS
0.5000 mg | ORAL_TABLET | Freq: Three times a day (TID) | ORAL | Status: DC | PRN
  Administered 2017-08-22 – 2017-08-25 (×5): 0.5 mg via ORAL
  Filled 2017-08-22 (×5): qty 1

## 2017-08-22 MED ORDER — ROCURONIUM BROMIDE 50 MG/5ML IV SOSY
PREFILLED_SYRINGE | INTRAVENOUS | Status: DC | PRN
  Administered 2017-08-22: 50 mg via INTRAVENOUS

## 2017-08-22 MED ORDER — BUPIVACAINE LIPOSOME 1.3 % IJ SUSP
20.0000 mL | INTRAMUSCULAR | Status: AC
  Administered 2017-08-22: 20 mL
  Filled 2017-08-22 (×2): qty 20

## 2017-08-22 MED ORDER — HYDROMORPHONE HCL 2 MG/ML IJ SOLN
0.2500 mg | INTRAMUSCULAR | Status: DC | PRN
  Administered 2017-08-22 (×4): 0.5 mg via INTRAVENOUS

## 2017-08-22 MED ORDER — PANTOPRAZOLE SODIUM 40 MG PO TBEC
40.0000 mg | DELAYED_RELEASE_TABLET | Freq: Every day | ORAL | Status: DC
  Administered 2017-08-23 – 2017-08-25 (×3): 40 mg via ORAL
  Filled 2017-08-22 (×3): qty 1

## 2017-08-22 MED ORDER — EZETIMIBE 10 MG PO TABS
10.0000 mg | ORAL_TABLET | Freq: Every day | ORAL | Status: DC
  Administered 2017-08-23 – 2017-08-25 (×3): 10 mg via ORAL
  Filled 2017-08-22 (×3): qty 1

## 2017-08-22 MED ORDER — MAGNESIUM CITRATE PO SOLN: 1.0000 | Freq: Once | ORAL | Status: DC | PRN

## 2017-08-22 MED ORDER — MIDAZOLAM HCL 2 MG/2ML IJ SOLN: 0.5000 mg | Freq: Once | INTRAMUSCULAR | Status: DC | PRN

## 2017-08-22 MED ORDER — RANOLAZINE ER 500 MG PO TB12
1000.0000 mg | ORAL_TABLET | Freq: Two times a day (BID) | ORAL | Status: DC
Start: 2017-08-22 — End: 2017-08-25
  Administered 2017-08-23 – 2017-08-25 (×6): 1000 mg via ORAL
  Filled 2017-08-22 (×6): qty 2

## 2017-08-22 MED ORDER — ACETAMINOPHEN 160 MG/5ML PO SOLN: 325.0000 mg | ORAL | Status: DC | PRN

## 2017-08-22 MED ORDER — ONDANSETRON HCL 4 MG PO TABS: 4.0000 mg | ORAL_TABLET | Freq: Four times a day (QID) | ORAL | Status: DC | PRN

## 2017-08-22 MED ORDER — ACETAMINOPHEN 500 MG PO TABS
1000.0000 mg | ORAL_TABLET | Freq: Four times a day (QID) | ORAL | Status: DC
  Administered 2017-08-23: 1000 mg via ORAL
  Filled 2017-08-22 (×2): qty 2

## 2017-08-22 MED ORDER — MENTHOL 3 MG MT LOZG: 1.0000 | LOZENGE | OROMUCOSAL | Status: DC | PRN

## 2017-08-22 MED ORDER — POLYETHYLENE GLYCOL 3350 17 G PO PACK: 17.0000 g | PACK | Freq: Every day | ORAL | Status: DC | PRN

## 2017-08-22 MED ORDER — OXYCODONE HCL 5 MG PO TABS: 5.0000 mg | ORAL_TABLET | ORAL | 0 refills | Status: AC | PRN

## 2017-08-22 MED ORDER — EPHEDRINE SULFATE-NACL 50-0.9 MG/10ML-% IV SOSY
PREFILLED_SYRINGE | INTRAVENOUS | Status: DC | PRN
  Administered 2017-08-22 (×4): 5 mg via INTRAVENOUS

## 2017-08-22 MED ORDER — ACETAMINOPHEN 500 MG PO TABS: 1000.0000 mg | ORAL_TABLET | Freq: Three times a day (TID) | ORAL | 0 refills | Status: AC

## 2017-08-22 MED ORDER — MIDAZOLAM HCL 2 MG/2ML IJ SOLN
INTRAMUSCULAR | Status: DC | PRN
  Administered 2017-08-22: 2 mg via INTRAVENOUS

## 2017-08-22 MED ORDER — HYDROMORPHONE HCL 2 MG/ML IJ SOLN
0.5000 mg | INTRAMUSCULAR | Status: DC | PRN
  Administered 2017-08-22 – 2017-08-24 (×6): 1 mg via INTRAVENOUS
  Filled 2017-08-22 (×6): qty 1

## 2017-08-22 MED ORDER — OXYCODONE HCL 5 MG PO TABS
ORAL_TABLET | ORAL | Status: AC
  Filled 2017-08-22: qty 1

## 2017-08-22 MED ORDER — BUPROPION HCL ER (XL) 150 MG PO TB24
150.0000 mg | ORAL_TABLET | Freq: Every day | ORAL | Status: DC
  Administered 2017-08-23 – 2017-08-25 (×3): 150 mg via ORAL
  Filled 2017-08-22 (×3): qty 1

## 2017-08-22 MED ORDER — FENTANYL CITRATE (PF) 100 MCG/2ML IJ SOLN: 50.0000 ug | Freq: Once | INTRAMUSCULAR | Status: DC

## 2017-08-22 MED ORDER — ACETAMINOPHEN 500 MG PO TABS
ORAL_TABLET | ORAL | Status: AC
  Administered 2017-08-22: 1000 mg via ORAL
  Filled 2017-08-22: qty 2

## 2017-08-22 MED ORDER — CHLORHEXIDINE GLUCONATE 4 % EX LIQD: 60.0000 mL | Freq: Once | CUTANEOUS | Status: DC

## 2017-08-22 MED ORDER — FENTANYL CITRATE (PF) 250 MCG/5ML IJ SOLN
INTRAMUSCULAR | Status: AC
  Filled 2017-08-22: qty 5

## 2017-08-22 MED ORDER — PROMETHAZINE HCL 25 MG/ML IJ SOLN: 6.2500 mg | INTRAMUSCULAR | Status: DC | PRN

## 2017-08-22 MED ORDER — LACTATED RINGERS IV SOLN
INTRAVENOUS | Status: DC
  Administered 2017-08-22 (×2): via INTRAVENOUS

## 2017-08-22 MED ORDER — PHENYLEPHRINE 40 MCG/ML (10ML) SYRINGE FOR IV PUSH (FOR BLOOD PRESSURE SUPPORT)
PREFILLED_SYRINGE | INTRAVENOUS | Status: DC | PRN
  Administered 2017-08-22 (×2): 80 ug via INTRAVENOUS
  Administered 2017-08-22 (×3): 40 ug via INTRAVENOUS

## 2017-08-22 MED ORDER — MEPERIDINE HCL 50 MG/ML IJ SOLN: 6.2500 mg | INTRAMUSCULAR | Status: DC | PRN

## 2017-08-22 MED ORDER — ONDANSETRON HCL 4 MG/2ML IJ SOLN: 4.0000 mg | Freq: Four times a day (QID) | INTRAMUSCULAR | Status: DC | PRN

## 2017-08-22 MED ORDER — GABAPENTIN 300 MG PO CAPS
300.0000 mg | ORAL_CAPSULE | Freq: Three times a day (TID) | ORAL | Status: DC
  Administered 2017-08-23 – 2017-08-25 (×8): 300 mg via ORAL
  Filled 2017-08-22 (×8): qty 1

## 2017-08-22 MED ORDER — METOPROLOL TARTRATE 50 MG PO TABS
50.0000 mg | ORAL_TABLET | Freq: Two times a day (BID) | ORAL | Status: DC
  Administered 2017-08-23 – 2017-08-25 (×6): 50 mg via ORAL
  Filled 2017-08-22 (×6): qty 1

## 2017-08-22 MED ORDER — GABAPENTIN 300 MG PO CAPS
300.0000 mg | ORAL_CAPSULE | Freq: Once | ORAL | Status: AC
  Administered 2017-08-22: 300 mg via ORAL

## 2017-08-22 MED ORDER — SORBITOL 70 % SOLN
30.0000 mL | Freq: Every day | Status: DC | PRN
Start: 2017-08-22 — End: 2017-08-25

## 2017-08-22 MED ORDER — GABAPENTIN 300 MG PO CAPS
ORAL_CAPSULE | ORAL | Status: AC
  Administered 2017-08-22: 300 mg via ORAL
  Filled 2017-08-22: qty 1

## 2017-08-22 MED ORDER — CLOPIDOGREL BISULFATE 75 MG PO TABS
75.0000 mg | ORAL_TABLET | Freq: Every day | ORAL | Status: DC
Start: 2017-08-23 — End: 2017-08-25
  Administered 2017-08-23 – 2017-08-25 (×3): 75 mg via ORAL
  Filled 2017-08-22 (×4): qty 1

## 2017-08-22 MED ORDER — ISOSORBIDE MONONITRATE ER 60 MG PO TB24
60.0000 mg | ORAL_TABLET | Freq: Every day | ORAL | Status: DC
Start: 2017-08-23 — End: 2017-08-25
  Administered 2017-08-23 – 2017-08-25 (×3): 60 mg via ORAL
  Filled 2017-08-22 (×3): qty 1

## 2017-08-22 MED ORDER — FENTANYL CITRATE (PF) 100 MCG/2ML IJ SOLN
INTRAMUSCULAR | Status: DC | PRN
  Administered 2017-08-22 (×5): 50 ug via INTRAVENOUS
  Administered 2017-08-22 (×2): 25 ug via INTRAVENOUS

## 2017-08-22 MED ORDER — DEXAMETHASONE SODIUM PHOSPHATE 10 MG/ML IJ SOLN
INTRAMUSCULAR | Status: DC | PRN
  Administered 2017-08-22: 5 mg via INTRAVENOUS

## 2017-08-22 MED ORDER — PROPOFOL 10 MG/ML IV BOLUS
INTRAVENOUS | Status: AC
  Filled 2017-08-22: qty 20

## 2017-08-22 MED ORDER — FUROSEMIDE 20 MG PO TABS: 20.0000 mg | ORAL_TABLET | Freq: Every day | ORAL | Status: DC | PRN

## 2017-08-22 MED ORDER — FENTANYL CITRATE (PF) 100 MCG/2ML IJ SOLN
INTRAMUSCULAR | Status: AC
  Filled 2017-08-22: qty 2

## 2017-08-22 MED ORDER — MIDAZOLAM HCL 2 MG/2ML IJ SOLN: 1.0000 mg | Freq: Once | INTRAMUSCULAR | Status: DC

## 2017-08-22 MED ORDER — ACETAMINOPHEN 325 MG PO TABS: 325.0000 mg | ORAL_TABLET | ORAL | Status: DC | PRN

## 2017-08-22 MED ORDER — PHENOL 1.4 % MT LIQD: 1.0000 | OROMUCOSAL | Status: DC | PRN

## 2017-08-22 MED ORDER — KETOROLAC TROMETHAMINE 15 MG/ML IJ SOLN
7.5000 mg | Freq: Four times a day (QID) | INTRAMUSCULAR | Status: DC
  Administered 2017-08-23: 7.5 mg via INTRAVENOUS
  Filled 2017-08-22 (×2): qty 1

## 2017-08-22 MED ORDER — PHENYLEPHRINE HCL 10 MG/ML IJ SOLN: INTRAMUSCULAR | Status: DC | PRN

## 2017-08-22 MED ORDER — ATORVASTATIN CALCIUM 80 MG PO TABS
80.0000 mg | ORAL_TABLET | Freq: Every day | ORAL | Status: DC
  Administered 2017-08-23 – 2017-08-24 (×2): 80 mg via ORAL
  Filled 2017-08-22 (×2): qty 1

## 2017-08-22 MED ORDER — NITROGLYCERIN 0.4 MG SL SUBL: 0.4000 mg | SUBLINGUAL_TABLET | SUBLINGUAL | Status: DC | PRN

## 2017-08-22 MED ORDER — SERTRALINE HCL 100 MG PO TABS
150.0000 mg | ORAL_TABLET | Freq: Every day | ORAL | Status: DC
  Administered 2017-08-23 – 2017-08-25 (×3): 150 mg via ORAL
  Filled 2017-08-22 (×3): qty 1

## 2017-08-22 MED ORDER — FENTANYL CITRATE (PF) 100 MCG/2ML IJ SOLN
25.0000 ug | INTRAMUSCULAR | Status: DC | PRN
  Administered 2017-08-22 (×3): 50 ug via INTRAVENOUS

## 2017-08-22 MED ORDER — PROPOFOL 10 MG/ML IV BOLUS
INTRAVENOUS | Status: DC | PRN
  Administered 2017-08-22 (×2): 20 mg via INTRAVENOUS
  Administered 2017-08-22: 90 mg via INTRAVENOUS

## 2017-08-22 MED ORDER — DEXAMETHASONE SODIUM PHOSPHATE 10 MG/ML IJ SOLN
10.0000 mg | Freq: Once | INTRAMUSCULAR | Status: AC
  Administered 2017-08-23: 10 mg via INTRAVENOUS
  Filled 2017-08-22: qty 1

## 2017-08-22 MED ORDER — OXYCODONE HCL 5 MG PO TABS: 5.0000 mg | ORAL_TABLET | Freq: Once | ORAL | Status: DC

## 2017-08-22 MED ORDER — CEFAZOLIN SODIUM-DEXTROSE 2-4 GM/100ML-% IV SOLN
2.0000 g | INTRAVENOUS | Status: AC
  Administered 2017-08-22: 2 g via INTRAVENOUS

## 2017-08-22 MED ORDER — APIXABAN 5 MG PO TABS
5.0000 mg | ORAL_TABLET | Freq: Two times a day (BID) | ORAL | Status: DC
Start: 2017-08-23 — End: 2017-08-24
  Administered 2017-08-23 (×2): 5 mg via ORAL
  Filled 2017-08-22 (×3): qty 1

## 2017-08-22 MED ORDER — METOCLOPRAMIDE HCL 5 MG PO TABS
5.0000 mg | ORAL_TABLET | Freq: Three times a day (TID) | ORAL | Status: DC | PRN
  Administered 2017-08-25: 5 mg via ORAL
  Filled 2017-08-22: qty 1

## 2017-08-22 MED ORDER — BACLOFEN 10 MG PO TABS
20.0000 mg | ORAL_TABLET | Freq: Three times a day (TID) | ORAL | Status: DC | PRN
  Administered 2017-08-24: 20 mg via ORAL
  Filled 2017-08-22: qty 2

## 2017-08-22 MED ORDER — ALBUTEROL SULFATE (2.5 MG/3ML) 0.083% IN NEBU
3.0000 mL | INHALATION_SOLUTION | Freq: Four times a day (QID) | RESPIRATORY_TRACT | Status: DC | PRN
Start: 2017-08-22 — End: 2017-08-25

## 2017-08-22 MED ORDER — RIFAMPIN 300 MG PO CAPS
300.0000 mg | ORAL_CAPSULE | Freq: Two times a day (BID) | ORAL | Status: DC
  Administered 2017-08-23 – 2017-08-25 (×5): 300 mg via ORAL
  Filled 2017-08-22 (×5): qty 1

## 2017-08-22 MED ORDER — OXYCODONE HCL 5 MG PO TABS
ORAL_TABLET | ORAL | Status: AC
  Administered 2017-08-22: 5 mg
  Filled 2017-08-22: qty 1

## 2017-08-22 MED ORDER — MIDAZOLAM HCL 2 MG/2ML IJ SOLN
INTRAMUSCULAR | Status: AC
  Filled 2017-08-22: qty 2

## 2017-08-22 MED ORDER — CEFAZOLIN SODIUM-DEXTROSE 2-4 GM/100ML-% IV SOLN
INTRAVENOUS | Status: AC
  Filled 2017-08-22: qty 100

## 2017-08-22 MED ORDER — HYDROMORPHONE HCL 2 MG/ML IJ SOLN
INTRAMUSCULAR | Status: AC
  Filled 2017-08-22: qty 1

## 2017-08-22 MED ORDER — LIDOCAINE 2% (20 MG/ML) 5 ML SYRINGE
INTRAMUSCULAR | Status: DC | PRN
  Administered 2017-08-22: 60 mg via INTRAVENOUS

## 2017-08-22 MED ORDER — SODIUM CHLORIDE 0.9 % IJ SOLN
INTRAMUSCULAR | Status: DC | PRN
  Administered 2017-08-22: 30 mL

## 2017-08-22 MED ORDER — LACTATED RINGERS IV SOLN
INTRAVENOUS | Status: DC
  Administered 2017-08-23: via INTRAVENOUS

## 2017-08-22 MED ORDER — OXYCODONE HCL 5 MG PO TABS
5.0000 mg | ORAL_TABLET | Freq: Once | ORAL | Status: AC | PRN
  Administered 2017-08-22: 5 mg via ORAL

## 2017-08-22 MED ORDER — OXYCODONE HCL 5 MG PO TABS
5.0000 mg | ORAL_TABLET | ORAL | Status: DC | PRN
  Administered 2017-08-23 – 2017-08-25 (×11): 10 mg via ORAL
  Filled 2017-08-22 (×11): qty 2

## 2017-08-22 MED ORDER — RIFAMPIN 300 MG PO CAPS: 300.0000 mg | ORAL_CAPSULE | Freq: Two times a day (BID) | ORAL | 0 refills | Status: DC

## 2017-08-22 MED ORDER — ACETAMINOPHEN 500 MG PO TABS
1000.0000 mg | ORAL_TABLET | Freq: Once | ORAL | Status: AC
  Administered 2017-08-22: 1000 mg via ORAL

## 2017-08-22 MED ORDER — TRAMADOL HCL 50 MG PO TABS: 50.0000 mg | ORAL_TABLET | Freq: Four times a day (QID) | ORAL | Status: DC | PRN

## 2017-08-22 MED ORDER — FERROUS SULFATE 325 (65 FE) MG PO TABS
325.0000 mg | ORAL_TABLET | Freq: Two times a day (BID) | ORAL | Status: DC
  Administered 2017-08-23 – 2017-08-25 (×5): 325 mg via ORAL
  Filled 2017-08-22 (×5): qty 1

## 2017-08-22 MED ORDER — ONDANSETRON HCL 4 MG/2ML IJ SOLN
INTRAMUSCULAR | Status: DC | PRN
Start: 2017-08-22 — End: 2017-08-22
  Administered 2017-08-22: 4 mg via INTRAVENOUS

## 2017-08-22 MED ORDER — VANCOMYCIN HCL IN DEXTROSE 1-5 GM/200ML-% IV SOLN
1000.0000 mg | INTRAVENOUS | Status: AC
  Administered 2017-08-22: 1000 mg via INTRAVENOUS

## 2017-08-22 MED ORDER — POVIDONE-IODINE 10 % EX SWAB: 2.0000 "application " | Freq: Once | CUTANEOUS | Status: DC

## 2017-08-22 MED ORDER — PHENYLEPHRINE HCL 10 MG/ML IJ SOLN
INTRAVENOUS | Status: DC | PRN
  Administered 2017-08-22: 10 ug/min via INTRAVENOUS

## 2017-08-22 MED ORDER — DOCUSATE SODIUM 100 MG PO CAPS: 100.0000 mg | ORAL_CAPSULE | Freq: Two times a day (BID) | ORAL | 0 refills | Status: DC

## 2017-08-22 MED ORDER — ROCURONIUM BROMIDE 100 MG/10ML IV SOLN: INTRAVENOUS | Status: DC | PRN

## 2017-08-22 MED ORDER — OXYCODONE HCL 5 MG/5ML PO SOLN: 5.0000 mg | Freq: Once | ORAL | Status: AC | PRN

## 2017-08-22 MED ORDER — METOCLOPRAMIDE HCL 5 MG/ML IJ SOLN: 5.0000 mg | Freq: Three times a day (TID) | INTRAMUSCULAR | Status: DC | PRN

## 2017-08-22 MED ORDER — SUGAMMADEX SODIUM 200 MG/2ML IV SOLN
INTRAVENOUS | Status: DC | PRN
Start: 2017-08-22 — End: 2017-08-22
  Administered 2017-08-22: 150 mg via INTRAVENOUS

## 2017-08-22 MED ORDER — 0.9 % SODIUM CHLORIDE (POUR BTL) OPTIME
TOPICAL | Status: DC | PRN
  Administered 2017-08-22 (×3): 1000 mL

## 2017-08-22 MED ORDER — VANCOMYCIN HCL IN DEXTROSE 1-5 GM/200ML-% IV SOLN
INTRAVENOUS | Status: AC
  Administered 2017-08-22: 1000 mg via INTRAVENOUS
  Filled 2017-08-22: qty 200

## 2017-08-22 MED ORDER — VANCOMYCIN HCL IN DEXTROSE 1-5 GM/200ML-% IV SOLN
1000.0000 mg | Freq: Two times a day (BID) | INTRAVENOUS | Status: DC
  Administered 2017-08-23 – 2017-08-25 (×6): 1000 mg via INTRAVENOUS
  Filled 2017-08-22 (×6): qty 200

## 2017-08-22 MED ORDER — PRAMIPEXOLE DIHYDROCHLORIDE 0.125 MG PO TABS: 0.2500 mg | ORAL_TABLET | Freq: Every evening | ORAL | Status: DC | PRN

## 2017-08-22 MED ORDER — DOCUSATE SODIUM 100 MG PO CAPS
100.0000 mg | ORAL_CAPSULE | Freq: Two times a day (BID) | ORAL | Status: DC
  Administered 2017-08-23 – 2017-08-25 (×6): 100 mg via ORAL
  Filled 2017-08-22 (×6): qty 1

## 2017-08-22 SURGICAL SUPPLY — 78 items
BOOTCOVER CLEANROOM LRG (PROTECTIVE WEAR) ×6 IMPLANT
BRUSH FEMORAL CANAL (MISCELLANEOUS) IMPLANT
CLOSURE STERI-STRIP 1/2X4 (GAUZE/BANDAGES/DRESSINGS) ×1
CLSR STERI-STRIP ANTIMIC 1/2X4 (GAUZE/BANDAGES/DRESSINGS) ×1 IMPLANT
CONT SPEC 4OZ CLIKSEAL STRL BL (MISCELLANEOUS) ×2 IMPLANT
COVER BACK TABLE 24X17X13 BIG (DRAPES) IMPLANT
COVER SURGICAL LIGHT HANDLE (MISCELLANEOUS) ×5 IMPLANT
DECANTER SPIKE VIAL GLASS SM (MISCELLANEOUS) ×3 IMPLANT
DRAPE IMP U-DRAPE 54X76 (DRAPES) ×3 IMPLANT
DRAPE INCISE IOBAN 66X45 STRL (DRAPES) IMPLANT
DRAPE ORTHO SPLIT 77X108 STRL (DRAPES) ×6
DRAPE SURG ORHT 6 SPLT 77X108 (DRAPES) ×2 IMPLANT
DRAPE U-SHAPE 47X51 STRL (DRAPES) ×3 IMPLANT
DRAPE U-SHAPE 76X120 STRL (DRAPES) ×6 IMPLANT
DRILL BIT 7/64X5 (BIT) ×3 IMPLANT
DRSG MEPILEX BORDER 4X12 (GAUZE/BANDAGES/DRESSINGS) ×3 IMPLANT
DRSG MEPILEX BORDER 4X8 (GAUZE/BANDAGES/DRESSINGS) ×2 IMPLANT
DURAPREP 26ML APPLICATOR (WOUND CARE) ×3 IMPLANT
ELECT BLADE 6.5 EXT (BLADE) IMPLANT
ELECT CAUTERY BLADE 6.4 (BLADE) ×3 IMPLANT
ELECT REM PT RETURN 9FT ADLT (ELECTROSURGICAL) ×3
ELECTRODE REM PT RTRN 9FT ADLT (ELECTROSURGICAL) ×1 IMPLANT
EVACUATOR 1/8 PVC DRAIN (DRAIN) IMPLANT
FACESHIELD WRAPAROUND (MASK) ×9 IMPLANT
FACESHIELD WRAPAROUND OR TEAM (MASK) ×3 IMPLANT
GAUZE SPONGE 4X4 12PLY STRL (GAUZE/BANDAGES/DRESSINGS) ×2 IMPLANT
GAUZE XEROFORM 5X9 LF (GAUZE/BANDAGES/DRESSINGS) ×3 IMPLANT
GLOVE BIOGEL PI IND STRL 7.0 (GLOVE) ×1 IMPLANT
GLOVE BIOGEL PI INDICATOR 7.0 (GLOVE) ×2
GLOVE ECLIPSE 7.0 STRL STRAW (GLOVE) ×3 IMPLANT
GLOVE ORTHO TXT STRL SZ7.5 (GLOVE) ×6 IMPLANT
GOWN STRL REUS W/ TWL LRG LVL3 (GOWN DISPOSABLE) ×3 IMPLANT
GOWN STRL REUS W/ TWL XL LVL3 (GOWN DISPOSABLE) ×1 IMPLANT
GOWN STRL REUS W/TWL LRG LVL3 (GOWN DISPOSABLE) ×9
GOWN STRL REUS W/TWL XL LVL3 (GOWN DISPOSABLE) ×3
HANDPIECE INTERPULSE COAX TIP (DISPOSABLE)
HEAD BIOLOX HIP 36/-5 (Joint) IMPLANT
HIP BIOLOX HD 36/-5 (Joint) ×3 IMPLANT
INSERT TRIDENT POLY 36MM 0DEG (Insert) ×2 IMPLANT
KIT BASIN OR (CUSTOM PROCEDURE TRAY) ×3 IMPLANT
KIT TURNOVER KIT B (KITS) ×3 IMPLANT
MANIFOLD NEPTUNE II (INSTRUMENTS) ×3 IMPLANT
NEEDLE 22X1 1/2 (OR ONLY) (NEEDLE) ×3 IMPLANT
NS IRRIG 1000ML POUR BTL (IV SOLUTION) ×3 IMPLANT
PACK TOTAL JOINT (CUSTOM PROCEDURE TRAY) ×3 IMPLANT
PACK UNIVERSAL I (CUSTOM PROCEDURE TRAY) ×3 IMPLANT
PAD ARMBOARD 7.5X6 YLW CONV (MISCELLANEOUS) ×6 IMPLANT
PASSER SUT SWANSON 36MM LOOP (INSTRUMENTS) ×3 IMPLANT
PILLOW ABDUCTION HIP (SOFTGOODS) ×3 IMPLANT
SET HNDPC FAN SPRY TIP SCT (DISPOSABLE) IMPLANT
STAPLER VISISTAT 35W (STAPLE) ×3 IMPLANT
SUCTION FRAZIER HANDLE 10FR (MISCELLANEOUS) ×2
SUCTION TUBE FRAZIER 10FR DISP (MISCELLANEOUS) ×1 IMPLANT
SUT ETHILON 3 0 PS 1 (SUTURE) ×2 IMPLANT
SUT FIBERWIRE #2 38 REV NDL BL (SUTURE) ×9
SUT MNCRL AB 4-0 PS2 18 (SUTURE) ×2 IMPLANT
SUT VIC AB 0 CT1 27 (SUTURE) ×6
SUT VIC AB 0 CT1 27XBRD ANBCTR (SUTURE) IMPLANT
SUT VIC AB 1 CT1 27 (SUTURE) ×6
SUT VIC AB 1 CT1 27XBRD ANBCTR (SUTURE) IMPLANT
SUT VIC AB 1 CTX 36 (SUTURE) ×12
SUT VIC AB 1 CTX36XBRD ANBCTR (SUTURE) ×4 IMPLANT
SUT VIC AB 2-0 FS1 27 (SUTURE) ×6 IMPLANT
SUT VIC AB 2-0 SH 27 (SUTURE)
SUT VIC AB 2-0 SH 27XBRD (SUTURE) IMPLANT
SUT VIC AB 3-0 SH 27 (SUTURE)
SUT VIC AB 3-0 SH 27X BRD (SUTURE) IMPLANT
SUTURE FIBERWR#2 38 REV NDL BL (SUTURE) ×3 IMPLANT
SYR 20CC LL (SYRINGE) ×3 IMPLANT
SYR 30ML SLIP (SYRINGE) ×3 IMPLANT
SYR BULB IRRIGATION 50ML (SYRINGE) ×4 IMPLANT
TOWEL OR 17X24 6PK STRL BLUE (TOWEL DISPOSABLE) ×3 IMPLANT
TOWEL OR 17X26 10 PK STRL BLUE (TOWEL DISPOSABLE) ×3 IMPLANT
TOWER CARTRIDGE SMART MIX (DISPOSABLE) IMPLANT
TRAY FOLEY METER SIL LF 16FR (CATHETERS) ×2 IMPLANT
TRAY FOLEY MTR SLVR 16FR STAT (SET/KITS/TRAYS/PACK) ×3 IMPLANT
WATER STERILE IRR 1000ML POUR (IV SOLUTION) ×3 IMPLANT
YANKAUER SUCT BULB TIP NO VENT (SUCTIONS) ×2 IMPLANT

## 2017-08-22 NOTE — Anesthesia Preprocedure Evaluation (Addendum)
Anesthesia Evaluation  Patient identified by MRN, date of birth, ID band Patient awake    Reviewed: NPO status , Patient's Chart, lab work & pertinent test results, reviewed documented beta blocker date and time   History of Anesthesia Complications Negative for: history of anesthetic complications  Airway Mallampati: II  TM Distance: >3 FB Neck ROM: Full    Dental  (+) Dental Advisory Given, Missing, Chipped   Pulmonary sleep apnea and Continuous Positive Airway Pressure Ventilation , COPD, former smoker (quit 2000),    breath sounds clear to auscultation       Cardiovascular hypertension, Pt. on home beta blockers and Pt. on medications + angina (NTG 3d ago) + CAD ('18 cath: Cath in May, 2018 shows Attretic LIMA to LAD, occluded SVG to OM, occluded SVG to PDA. ), + Past MI, + Cardiac Stents, + CABG and + Peripheral Vascular Disease  + dysrhythmias Atrial Fibrillation  Rhythm:Regular Rate:Normal  '18 cath: 25-35% by visual estimate, moderate to severe left ventricular systolic dysfunction. Occluded SVG   Neuro/Psych Anxiety Chronic pain: narcotics    GI/Hepatic Neg liver ROS, GERD  Medicated and Controlled,  Endo/Other  negative endocrine ROS  Renal/GU negative Renal ROS     Musculoskeletal  (+) Arthritis ,   Abdominal (+) + obese,   Peds  Hematology  (+) Blood dyscrasia (Hb 10.8, plt 208k), , eliquis   Anesthesia Other Findings   Reproductive/Obstetrics                            Anesthesia Physical Anesthesia Plan  ASA: III  Anesthesia Plan: General   Post-op Pain Management:    Induction: Intravenous  PONV Risk Score and Plan: 4 or greater and Ondansetron, Dexamethasone and Treatment may vary due to age or medical condition  Airway Management Planned: Oral ETT  Additional Equipment: Arterial line  Intra-op Plan:   Post-operative Plan: Extubation in OR  Informed Consent: I  have reviewed the patients History and Physical, chart, labs and discussed the procedure including the risks, benefits and alternatives for the proposed anesthesia with the patient or authorized representative who has indicated his/her understanding and acceptance.   Dental advisory given  Plan Discussed with: CRNA and Surgeon  Anesthesia Plan Comments: (Plan routine monitors, A line, GETA)        Anesthesia Quick Evaluation

## 2017-08-22 NOTE — Op Note (Signed)
08/22/2017  6:26 PM  PATIENT:  Wendy [name]    PRE-OPERATIVE DIAGNOSIS:  infected right total hip  POST-OPERATIVE DIAGNOSIS:  Same  PROCEDURE:  TOTAL HIP REVISION  SURGEON:  MURPHY, Ernesta Amble, MD  ASSISTANT: Roxan Hockey, PA-C, he was present and scrubbed throughout the case, critical for completion in a timely fashion, and for retraction, instrumentation, and closure.   ANESTHESIA:   gen  PREOPERATIVE INDICATIONS:  [name] is a  67 y.o. female with a diagnosis of infected right total hip who failed conservative measures and elected for surgical management.    The risks benefits and alternatives were discussed with the patient preoperatively including but not limited to the risks of infection, bleeding, nerve injury, cardiopulmonary complications, the need for revision surgery, among others, and the patient was willing to proceed.  OPERATIVE IMPLANTS: replaced Head/ball  OPERATIVE FINDINGS: Some necrotic and purulent tissue.  Stable implants  BLOOD LOSS: 240  COMPLICATIONS: none  TOURNIQUET TIME: none  OPERATIVE PROCEDURE:  Patient was identified in the preoperative holding area and site was marked by me She was transported to the operating theater and placed on the table in supine position taking care to pad all bony prominences. After a preincinduction time out anesthesia was induced. The right lower extremity was prepped and draped in normal sterile fashion and a pre-incision timeout was performed. She received ancef and vanc for preoperative antibiotics.   I made an anterior incision through her previous surgical incision.  Identified pocket of purulence and expressed fluid here and collected for lab there was some necrotic fat as well.  I identified the tensor fascia and incised this and retracted the tensor muscle posteriorly.  Deep to this identified her scar tissue over her anterior capsule I excised this I debrided any necrotic appearing tissue and any  devitalized tissue.  This was an extensive debridement I used a Bovie knife and curette.  I identified her previous hardware I performed an anterior dislocation of her hip and remove the ball from the neck.  Thoroughly irrigated this and debrided around the neck as well.  Next I used an osteotome to remove the poly-from the acetabular implant.  I debrided all necrotic or devitalized tissue from around the edge of the acetabular component as well.  I performed a thorough irrigation.  I repeated this excisional debridement.  And report performed a repeat irrigation.  I then inserted a new polyethylene liner impacted this in the place placed another -5 ceramic head and reduce this.  I took x-rays and she maintained good alignment and leg length.  The drain was placed down to the joint a layer closure was performed sterile dressings were applied she was awoken taken to PACU in stable condition  POST OPERATIVE PLAN: WBAT, ID consult and picc line, chemical dvt px along with ambulation

## 2017-08-22 NOTE — Interval H&P Note (Signed)
History and Physical Interval Note:  08/22/2017 2:07 PM  Wendy [name]  has presented today for surgery, with the diagnosis of infected right total hip  The various methods of treatment have been discussed with the patient and family. After consideration of risks, benefits and other options for treatment, the patient has consented to  Procedure(s): TOTAL HIP REVISION (Right) as a surgical intervention .  The patient's history has been reviewed, patient examined, no change in status, stable for surgery.  I have reviewed the patient's chart and labs.  Questions were answered to the patient's satisfaction.     Wendy [name] D

## 2017-08-22 NOTE — Transfer of Care (Signed)
Immediate Anesthesia Transfer of Care Note  Patient: Wendy [name]  Procedure(s) Performed: TOTAL HIP REVISION (Right Hip)  Patient Location: PACU  Anesthesia Type:General  Level of Consciousness: awake, alert , oriented and patient cooperative  Airway & Oxygen Therapy: Patient Spontanous Breathing and Patient connected to face mask oxygen  Post-op Assessment: Report given to RN and Post -op Vital signs reviewed and stable  Post vital signs: Reviewed and stable  Last Vitals:  Vitals Value Taken Time  BP 163/79 08/22/2017  6:28 PM  Temp    Pulse 65 08/22/2017  6:32 PM  Resp 21 08/22/2017  6:32 PM  SpO2 100 % 08/22/2017  6:32 PM  Vitals shown include unvalidated device data.  Last Pain:  Vitals:   08/22/17 1107  TempSrc:   PainSc: 8       Patients Stated Pain Goal: 5 (24/09/73 5329)  Complications: No apparent anesthesia complications

## 2017-08-22 NOTE — Consult Note (Signed)
[facility] for Infectious Disease    Date of Admission:  08/22/2017   Total days of antibiotics 0        **Received 8 days Doxy + Rif               Reason for Consult: Right PJI    Referring Provider: T. [name]  Assessment: BULAR HICKOK is a 67 y.o. female with what appears to be early post-operative infection involving her right total knee arthroplasty prosthesis. Cell count indicative of infection. Was on antibiotics pre-arthrocentesis (doxycycline + rifampin) and nothing growing on culture --> would suspect staph aureus here. Would recommend starting post-op vancomycin and rifampin following procedure today (polyethylene liner exchange +/- ceramic head, I&D). We discussed with her post -op need for PICC line (OK to place after surgery) and prolonged IV antibiotics with conversion to oral therapy for at minimum 3 months. Will discuss more after surgery.   Plan: 1. After OR would start vancomycin per pharmacy protocol  2. Rifampin 300 mg BID with food  3. PICC line placement 08/23/17 4. Care management consult to assist with home IV antibiotics 5. Will need a few days in the hospital to follow cultures and arrange home health - will notify Carolynn Sayers.    Principal Problem:   Infection of right prosthetic hip joint ([place]) Active Problems:   Hx of CABG   Hypertension   HLD (hyperlipidemia)   COPD (chronic obstructive pulmonary disease) (HCC)   Chronic back pain   GERD (gastroesophageal reflux disease)   PAF (paroxysmal atrial fibrillation) ([place])   Coronary artery disease involving native coronary artery of native heart without angina pectoris   Chronic anticoagulation   CAD S/P multiple PCIs-last one 2016   Acute combined systolic and diastolic heart failure ([place])   . chlorhexidine  60 mL Topical Once  . chlorhexidine  60 mL Topical Once  . povidone-iodine  2 application Topical Once  . tranexamic acid (CYKLOKAPRON) topical -INTRAOP  2,000 mg Topical Once     HPI: LORIA LACINA is a 67 y.o. female with past medical history significant for arthritis, s/p R TKR 07/19/2017, CAD, chronic back pain, MI, PAF, PAD, Peripheral Neuropathy and impaired glucose tolerance.   After her right total hip arthroplasty on 07/19/17 she was readmitted 3 days following the procedure for acute coronary syndrome (substernal chest pain relieved by SL NTG, elevated troponin and BNP). LHC done 5/21 showed occluded grafts to RCA and OM1 with native disease to LAD (50%). EF 40-45%. She is on chronic Eliquis and Plavix and these were continued following initial surgery. Does not seem she had trouble with bleeding post op from chart review.    In further discussion with Ms. Mefferd in pre-op area she tells [place] that she was seen by her PCP 8 days ago when she had a "know" come up on her incision and redness. She does not describe any drainage or organized fluid collection that was fluctuant on the surface. Described to be hard/firm. She was started on 2 antibiotics that she took twice a day (one was green pill that caused nausea). She went to Dr. Debroah Loop office and had aspiration of hip yielding 72mL cloudy/purulent material; called office and nothing available/growing on culture but WBC on cell count 55K. She reports that the firmness is improved following abx and arthrocentesis. Prior to admission denies any fevers or chills. Has chronic night sweats that are unchanged from normal calibre. There is  associated pain with straight leg raise but has been participating in PT and walking normally.   Amoxicillin "allergy" - causes severe diarrhea only.   Review of Systems: ROS  Past Medical History:  Diagnosis Date  . Anemia   . Anginal pain ([place])    jaw pain 07/2016, s/p 07/15/16 nuclear stress test  . Anxiety   . Arthritis   . CAD (coronary artery disease), native coronary artery    a. Hx CABG 1998, last PCI 2013 b.LHC 07/2013:  EF 55-60%, L-LAD prob atretic, no sig disease in LAD,  S-OM1 ok with patent stent, S-dRCA ok => med Rx. c. LHC 01/2015 DES to SVG-RCA.  Marland Kitchen Chronic back pain   . Chronic leg pain   . COPD (chronic obstructive pulmonary disease) ([place])   . Fall 08/2016  . GERD (gastroesophageal reflux disease)   . Glucose intolerance (impaired glucose tolerance)   . HLD (hyperlipidemia)   . Hypertension   . Myocardial infarction ([place])   . PAD (peripheral artery disease) ([place])   . PAF (paroxysmal atrial fibrillation) ([place])    a. 09/2013 post-op b. 01/2015  . Peripheral neuropathy   . Shortness of breath    when walking  . Sleep apnea    uses a cpap  . Wears glasses     Social History   Tobacco Use  . Smoking status: Former Smoker    Last attempt to quit: 07/20/1998    Years since quitting: 19.1  . Smokeless tobacco: Never Used  Substance Use Topics  . Alcohol use: No  . Drug use: No    Family History  Problem Relation Age of Onset  . Lung cancer Mother   . Clotting disorder Father   . Lung cancer Sister   . Heart disease Brother   . Heart disease Brother   . Colon cancer Neg Hx    Allergies  Allergen Reactions  . Benadryl [Diphenhydramine] Shortness Of Breath  . Adhesive [Tape] Other (See Comments)    Burning of skin  . Amoxicillin Diarrhea and Other (See Comments)    Has patient had a PCN reaction causing immediate rash, facial/tongue/throat swelling, SOB or lightheadedness with hypotension: No Has patient had a PCN reaction causing severe rash involving mucus membranes or skin necrosis: No Has patient had a PCN reaction that required hospitalization No Has patient had a PCN reaction occurring within the last 10 years: Yes -- noted rxn as diarrhea If all of the above answers are "NO", then may proceed with Cephalosporin use.   Yvette Rack [Cyclobenzaprine] Other (See Comments)    Causes restless leg syndrome  . Hydrocodone Itching    OBJECTIVE: Blood pressure (!) 169/59, pulse (!) 59, temperature 98.1 F (36.7 C), temperature source  Oral, resp. rate 18, height 5\' 1"  (1.549 m), weight 164 lb (74.4 kg), SpO2 96 %.  Physical Exam  Constitutional: She is oriented to person, place, and time. She appears well-developed and well-nourished.  Resting comfortably on stretcher while lines for OR being placed.   HENT:  Mouth/Throat: Mucous membranes are normal. No oral lesions. Normal dentition. No dental abscesses. No oropharyngeal exudate.  Eyes: Pupils are equal, round, and reactive to light. No scleral icterus.  Cardiovascular: Normal rate, regular rhythm, normal heart sounds and intact distal pulses.  No murmur heard. Pulmonary/Chest: Effort normal and breath sounds normal. No respiratory distress.  Abdominal: Soft. Bowel sounds are normal. She exhibits no distension. There is no tenderness.  Musculoskeletal:       Legs:  Unable to assess ROM presently.   Neurological: She is alert and oriented to person, place, and time.  Skin: Skin is warm and dry. No rash noted.  Psychiatric: She has a normal mood and affect. Judgment normal.  In good spirits today and engaged in care discussion  Vitals reviewed.   Lab Results Lab Results  Component Value Date   WBC 6.2 08/18/2017   HGB 10.8 (L) 08/18/2017   HCT 35.6 (L) 08/18/2017   MCV 87.7 08/18/2017   PLT 208 08/18/2017    Lab Results  Component Value Date   CREATININE 0.74 08/18/2017   BUN 16 08/18/2017   NA 141 08/18/2017   K 3.9 08/18/2017   CL 104 08/18/2017   CO2 28 08/18/2017    Lab Results  Component Value Date   ALT 8 12/05/2016   AST 8 12/05/2016   ALKPHOS 67 12/05/2016   BILITOT 0.3 12/05/2016     Microbiology: Recent Results (from the past 240 hour(s))  Surgical pcr screen     Status: None   Collection Time: 08/18/17  3:23 PM  Result Value Ref Range Status   MRSA, PCR NEGATIVE NEGATIVE Final   Staphylococcus aureus NEGATIVE NEGATIVE Final    Comment: (NOTE) The Xpert SA Assay (FDA approved for NASAL specimens in patients 27 years of age and  older), is one component of a comprehensive surveillance program. It is not intended to diagnose infection nor to guide or monitor treatment. Performed at [facility], [place] [address], [place], [place] [address]     Janene Madeira, MSN, NP-C [facility] for Infectious [place] Cell: [phone] Pager: [phone]  08/22/2017 4:15 PM

## 2017-08-22 NOTE — Anesthesia Procedure Notes (Signed)
Procedure Name: Intubation Date/Time: 08/22/2017 3:37 PM Performed by: Genelle Bal, CRNA Pre-anesthesia Checklist: Patient identified, Emergency Drugs available, Suction available and Patient being monitored Patient Re-evaluated:Patient Re-evaluated prior to induction Oxygen Delivery Method: Circle system utilized Preoxygenation: Pre-oxygenation with 100% oxygen Induction Type: IV induction Ventilation: Mask ventilation without difficulty and Oral airway inserted - appropriate to patient size Laryngoscope Size: Sabra Heck and 2 Grade View: Grade I Tube type: Oral Tube size: 7.0 mm Number of attempts: 1 Airway Equipment and Method: Stylet and Oral airway Placement Confirmation: ETT inserted through vocal cords under direct vision,  positive ETCO2 and breath sounds checked- equal and bilateral Secured at: 21 cm Tube secured with: Tape Dental Injury: Teeth and Oropharynx as per pre-operative assessment  Comments: Small MO, limited neck ROM noted. Glidescope on standby.

## 2017-08-22 NOTE — Progress Notes (Signed)
[facility] Infusion Coordinator will follow pt with ID team while inpatient to [facility] services for home IV ABX at DC. ' If patient discharges after hours, please call [phone].   AASIYA CREASEY 08/22/2017, 5:18 PM

## 2017-08-22 NOTE — Progress Notes (Signed)
Pharmacy Antibiotic Note  Wendy [name] is a 67 y.o. female admitted on 08/22/2017 with prosthetic joint infection.  Pharmacy has been consulted for vancomycin dosing.  Plan: Vancomycin 1000mg  IV every 12 hours.  Goal trough 15-20 mcg/mL.  Height: 5\' 1"  (154.9 cm) Weight: 164 lb (74.4 kg) IBW/kg (Calculated) : 47.8  Temp (24hrs), Avg:98.1 F (36.7 C), Min:97.7 F (36.5 C), Max:98.4 F (36.9 C)  Recent Labs  Lab 08/18/17 1524  WBC 6.2  CREATININE 0.74    Estimated Creatinine Clearance: 64.6 mL/min (by C-G formula based on SCr of 0.74 mg/dL).    Allergies  Allergen Reactions  . Benadryl [Diphenhydramine] Shortness Of Breath  . Adhesive [Tape] Other (See Comments)    Burning of skin  . Amoxicillin Diarrhea and Other (See Comments)    Has patient had a PCN reaction causing immediate rash, facial/tongue/throat swelling, SOB or lightheadedness with hypotension: No Has patient had a PCN reaction causing severe rash involving mucus membranes or skin necrosis: No Has patient had a PCN reaction that required hospitalization No Has patient had a PCN reaction occurring within the last 10 years: Yes -- noted rxn as diarrhea If all of the above answers are "NO", then may proceed with Cephalosporin use.   Yvette Rack [Cyclobenzaprine] Other (See Comments)    Causes restless leg syndrome  . Hydrocodone Itching     Thank you for allowing pharmacy to be a part of this patient's care.  Wynona Neat, PharmD, BCPS  08/22/2017 11:52 PM

## 2017-08-23 ENCOUNTER — Encounter (HOSPITAL_COMMUNITY): Payer: Self-pay | Admitting: Orthopedic Surgery

## 2017-08-23 DIAGNOSIS — Z91040 Latex allergy status: Secondary | ICD-10-CM

## 2017-08-23 DIAGNOSIS — Z79899 Other long term (current) drug therapy: Secondary | ICD-10-CM

## 2017-08-23 DIAGNOSIS — Z7901 Long term (current) use of anticoagulants: Secondary | ICD-10-CM

## 2017-08-23 DIAGNOSIS — Z978 Presence of other specified devices: Secondary | ICD-10-CM

## 2017-08-23 DIAGNOSIS — T8451XA Infection and inflammatory reaction due to internal right hip prosthesis, initial encounter: Principal | ICD-10-CM

## 2017-08-23 LAB — BASIC METABOLIC PANEL
Anion gap: 6 (ref 5–15)
BUN: 13 mg/dL (ref 6–20)
CO2: 27 mmol/L (ref 22–32)
Calcium: 8.2 mg/dL — ABNORMAL LOW (ref 8.9–10.3)
Chloride: 106 mmol/L (ref 101–111)
Creatinine, Ser: 0.71 mg/dL (ref 0.44–1.00)
GFR calc Af Amer: >60 mL/min (ref >60)
GFR calc non Af Amer: >60 mL/min (ref >60)
Glucose, Bld: 122 mg/dL — ABNORMAL HIGH (ref 65–99)
Potassium: 3.8 mmol/L (ref 3.5–5.1)
Sodium: 139 mmol/L (ref 135–145)

## 2017-08-23 LAB — CBC
HCT: 30.5 % — ABNORMAL LOW (ref 36.0–46.0)
Hemoglobin: 9.3 g/dL — ABNORMAL LOW (ref 12.0–15.0)
MCH: 26.8 pg (ref 26.0–34.0)
MCHC: 30.5 g/dL (ref 30.0–36.0)
MCV: 87.9 fL (ref 78.0–100.0)
Platelets: 183 10*3/uL (ref 150–400)
RBC: 3.47 MIL/uL — ABNORMAL LOW (ref 3.87–5.11)
RDW: 15.8 % — ABNORMAL HIGH (ref 11.5–15.5)
WBC: 8.2 10*3/uL (ref 4.0–10.5)

## 2017-08-23 MED ORDER — ACETAMINOPHEN 500 MG PO TABS
1000.0000 mg | ORAL_TABLET | Freq: Three times a day (TID) | ORAL | Status: DC
  Administered 2017-08-23 – 2017-08-25 (×7): 1000 mg via ORAL
  Filled 2017-08-23 (×7): qty 2

## 2017-08-23 MED ORDER — HYDROXYZINE HCL 25 MG PO TABS
25.0000 mg | ORAL_TABLET | Freq: Four times a day (QID) | ORAL | Status: DC | PRN
  Administered 2017-08-23: 25 mg via ORAL
  Filled 2017-08-23: qty 1

## 2017-08-23 MED ORDER — SODIUM CHLORIDE 0.9% FLUSH: 10.0000 mL | INTRAVENOUS | Status: DC | PRN

## 2017-08-23 NOTE — Progress Notes (Signed)
Peripherally Inserted Central Catheter/Midline Placement  The IV Nurse has discussed with the patient and/or persons authorized to consent for the patient, the purpose of this procedure and the potential benefits and risks involved with this procedure.  The benefits include less needle sticks, lab draws from the catheter, and the patient may be discharged home with the catheter. Risks include, but not limited to, infection, bleeding, blood clot (thrombus formation), and puncture of an artery; nerve damage and irregular heartbeat and possibility to perform a PICC exchange if needed/ordered by physician.  Alternatives to this procedure were also discussed.  Bard Power PICC patient education guide, fact sheet on infection prevention and patient information card has been provided to patient /or left at bedside.    PICC/Midline Placement Documentation        Darlyn Read 08/23/2017, 11:04 AM

## 2017-08-23 NOTE — Progress Notes (Signed)
OT Cancellation Note  Patient Details Name: Wendy [name] MRN: [id number] DOB: 1950/04/11   Cancelled Treatment:    Reason Eval/Treat Not Completed: Pain limiting ability to participate. Pt reports significant pain and has just returned to bed after eating lunch seated at EOB. She requests that OT return at later time for evaluation. Pt calling for nurse to bring pain medications on my arrival. Will check back as able.   Norman Herrlich, MS OTR/L  Pager: [phone]  Norman Herrlich 08/23/2017, 2:21 PM

## 2017-08-23 NOTE — Evaluation (Signed)
Occupational Therapy Evaluation Patient Details Name: Wendy [name] MRN: [id number] DOB: 1950/06/27 Today's Date: 08/23/2017    History of Present Illness Pt is a 67 y.o. 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    Clinical Impression   PTA, pt was independent with ADL and functional mobility. She currently requires min assist for LB ADL, min guard assist for toilet transfers, and min guard assist for standing grooming tasks requiring single UE support at the sink. Pt limited this session by severe pain but is highly motivated to participate and recover. Initiated education concerning safety with ADL participation as well as compensatory ADL strategies. She would benefit from continued OT services while admitted to improve independence and safety with ADL and functional mobility prior to returning home.     Follow Up Recommendations  No OT follow up;Supervision/Assistance - 24 hour    Equipment Recommendations  None recommended by OT(has needs met)    Recommendations for Other Services       Precautions / Restrictions Precautions Precautions: Fall Precaution Comments: Drain in place Restrictions Weight Bearing Restrictions: Yes RLE Weight Bearing: Weight bearing as tolerated      Mobility Bed Mobility Overal bed mobility: Needs Assistance Bed Mobility: Sit to Supine       Sit to supine: Min assist   General bed mobility comments: OOB in recliner on my arrival  Transfers Overall transfer level: Needs assistance Equipment used: Rolling walker (2 wheeled) Transfers: Sit to/from Stand Sit to Stand: Supervision         General transfer comment: Cues for safe hand placement. Min guard assist once ambulating.     Balance Overall balance assessment: Needs assistance Sitting-balance support: No upper extremity supported;Feet supported Sitting balance-Leahy Scale: Fair     Standing balance support: Single  extremity supported;Bilateral upper extremity supported;During functional activity Standing balance-Leahy Scale: Poor Standing balance comment: Relies on at least single UE support during standing grooming tasks.                            ADL either performed or assessed with clinical judgement   ADL Overall ADL's : Needs assistance/impaired Eating/Feeding: Set up;Sitting   Grooming: Min guard;Standing;Oral care Grooming Details (indicate cue type and reason): Leaning on elbows at sink.  Upper Body Bathing: Set up;Sitting   Lower Body Bathing: Minimal assistance;Sit to/from stand   Upper Body Dressing : Set up;Sitting   Lower Body Dressing: Minimal assistance;Sit to/from stand   Toilet Transfer: Min guard;Ambulation;RW;Comfort height toilet;Grab bars   Toileting- Clothing Manipulation and Hygiene: Supervision/safety;Sit to/from stand       Functional mobility during ADLs: Min guard;Rolling walker General ADL Comments: Initiated education concerning knee precautions related to ADL.      Vision Baseline Vision/History: Wears glasses Patient Visual Report: No change from baseline(wearing glasses during eval) Vision Assessment?: No apparent visual deficits     Perception     Praxis      Pertinent Vitals/Pain Pain Assessment: 0-10 Pain Score: 7  Faces Pain Scale: Hurts worst Pain Location: R hip Pain Descriptors / Indicators: Grimacing;Sharp;Shooting;Throbbing;Burning;Moaning Pain Intervention(s): Limited activity within patient's tolerance;Monitored during session;Repositioned;Patient requesting pain meds-RN notified;Premedicated before session     Hand Dominance     Extremity/Trunk Assessment Upper Extremity Assessment Upper Extremity Assessment: Overall WFL for tasks assessed   Lower Extremity Assessment Lower Extremity Assessment: RLE deficits/detail RLE Deficits / Details: Decreased strength and  ROM as expected post-operatively.         Communication Communication Communication: No difficulties   Cognition Arousal/Alertness: Awake/alert Behavior During Therapy: Flat affect Overall Cognitive Status: Within Functional Limits for tasks assessed                                     General Comments       Exercises Total Joint Exercises Quad Sets: AROM;Right;10 reps;Supine Heel Slides: AAROM;Right;5 reps;Supine Hip ABduction/ADduction: AAROM;Right;10 reps;Supine Long Arc Quad: AROM;Right;10 reps;Seated   Shoulder Instructions      Home Living Family/patient expects to be discharged to:: Private residence Living Arrangements: Other relatives Available Help at Discharge: Family;Available 24 hours/day(aunt and nephew will be able to assist) Type of Home: Mobile home Home Access: Stairs to enter;Ramped entrance Entrance Stairs-Number of Steps: 2 Entrance Stairs-Rails: Right("sturdy metal post thing I always hold onto") Home Layout: One level     Bathroom Shower/Tub: Tub/shower unit("I'll be doing wash-ups until I feel better")   Bathroom Toilet: Standard     Home Equipment: Environmental consultant - 2 wheels;Bedside commode;Walker - 4 wheels          Prior Functioning/Environment Level of Independence: Independent                 OT Problem List: Decreased strength;Decreased activity tolerance;Impaired balance (sitting and/or standing);Decreased range of motion;Decreased safety awareness;Decreased knowledge of use of DME or AE;Decreased knowledge of precautions;Pain      OT Treatment/Interventions: Self-care/ADL training;Therapeutic exercise;Energy conservation;DME and/or AE instruction;Therapeutic activities;Patient/family education;Balance training    OT Goals(Current goals can be found in the care plan section) Acute Rehab OT Goals Patient Stated Goal: Less pain OT Goal Formulation: With patient Time For Goal Achievement: 09/06/17 Potential to Achieve Goals: Good  OT Frequency: Min 2X/week    Barriers to D/C:            Co-evaluation              AM-PAC PT "6 Clicks" Daily Activity     Outcome Measure Help from another person eating meals?: None Help from another person taking care of personal grooming?: A Little Help from another person toileting, which includes using toliet, bedpan, or urinal?: A Little Help from another person bathing (including washing, rinsing, drying)?: A Little Help from another person to put on and taking off regular upper body clothing?: None Help from another person to put on and taking off regular lower body clothing?: A Little 6 Click Score: 20   End of Session Equipment Utilized During Treatment: Rolling walker Nurse Communication: Mobility status;Patient requests pain meds(pt working with PT and then back to bed)  Activity Tolerance: Patient tolerated treatment well Patient left: Other (comment)(ambulating in room with PT)  OT Visit Diagnosis: Other abnormalities of gait and mobility (R26.89);Pain Pain - Right/Left: Right Pain - part of body: Hip                Time: 1535-1555 OT Time Calculation (min): 20 min Charges:  OT General Charges $OT Visit: 1 Visit OT Evaluation $OT Eval Moderate Complexity: 1 Mod G-Codes:     Norman Herrlich, MS OTR/L  Pager: [place] A Keyron Pokorski 08/23/2017, 5:21 PM

## 2017-08-23 NOTE — Plan of Care (Signed)
  Problem: Education: Goal: Knowledge of General Education information will improve Outcome: Progressing   

## 2017-08-23 NOTE — Progress Notes (Addendum)
Subjective: Patient reports pain as moderate.  No significant diet yet.  Urinating.  +Flatus.  No CP, SOB.  Not yet OOB -did not arrive on the floor until late p.m. yesterday.  Objective:   VITALS:   Vitals:   08/22/17 2332 08/23/17 0029 08/23/17 0607 08/23/17 0615  BP: (!) 144/63 (!) 142/53 (!) 114/53   Pulse:  70 (!) 59 61  Resp: 16 14 12    Temp: 98.1 F (36.7 C) 98.9 F (37.2 C) (!) 97.5 F (36.4 C)   TempSrc: Oral Oral Oral   SpO2: 100% 99% 99%   Weight:      Height:       CBC Latest Ref Rng & Units 08/23/2017 08/18/2017 07/03/2017  WBC 4.0 - 10.5 K/uL 8.2 6.2 6.8  Hemoglobin 12.0 - 15.0 g/dL 9.3(L) 10.8(L) 11.3  Hematocrit 36.0 - 46.0 % 30.5(L) 35.6(L) 35.9  Platelets 150 - 400 K/uL 183 208 235   BMP Latest Ref Rng & Units 08/23/2017 08/18/2017 07/03/2017  Glucose 65 - 99 mg/dL 122(H) 95 89  BUN 6 - 20 mg/dL 13 16 13   Creatinine 0.44 - 1.00 mg/dL 0.71 0.74 0.88  BUN/Creat Ratio 12 - 28 - - 15  Sodium 135 - 145 mmol/L 139 141 146(H)  Potassium 3.5 - 5.1 mmol/L 3.8 3.9 4.8  Chloride 101 - 111 mmol/L 106 104 108(H)  CO2 22 - 32 mmol/L 27 28 25   Calcium 8.9 - 10.3 mg/dL 8.2(L) 8.9 9.3   Intake/Output      06/18 0701 - 06/19 0700 06/19 0701 - 06/20 0700   P.O. 240    I.V. (mL/kg) 1339.8 (18)    IV Piggyback 200    Total Intake(mL/kg) 1779.8 (23.9)    Urine (mL/kg/hr) 1375    Drains 38    Blood 500    Total Output 1913    Net -133.2            Physical Exam: General: NAD.  Supine in bed asleep on arrival.  Calm and conversant upon awakening.  No increased work of breathing.  MSK RLE: Neurovascularly intact Sensation intact distally Feet warm Dorsiflexion/Plantar flexion intact Incision: dressing C/D/I Hemovac in place with minimal bloody/serosanguineous drainage   Assessment / Plan: 1 Day Post-Op  S/P Procedure(s) (LRB): TOTAL HIP REVISION (Right) by Dr. Ernesta Amble. [name] on 08/22/2017  Principal Problem:   Infection of right prosthetic hip joint  ([place]) Active Problems:   Hx of CABG   Hypertension   HLD (hyperlipidemia)   COPD (chronic obstructive pulmonary disease) (HCC)   Chronic back pain   GERD (gastroesophageal reflux disease)   PAF (paroxysmal atrial fibrillation) ([place])   Coronary artery disease involving native coronary artery of native heart without angina pectoris   Chronic anticoagulation   CAD S/P multiple PCIs-last one 2016   Acute combined systolic and diastolic heart failure (HCC)   Primary osteoarthritis of hip   Acute Blood Loss Anemia -hemoglobin 9.3<10.8.  No sign of active bleeding.  Likely with delutional component.  Platelets within normal limits.  Currently asymptomatic.  Follow labs.  Right prosthetic hip joint infection approximately 1 year after Right THA.  Now status post revision hip arthroplasty 08/22/2017 Doing well early on postop day 1. No significant diet yet and not yet up with therapy- arrived to floor late p.m. last night Difficult early pain control, improved this morning   Continue antibiotic therapy per infectious disease: Rifampin 300 mg twice daily.  Vancomycin per pharmacy.  Adjust based  on cultures.  Planning for 6 weeks of IV therapy then 6 weeks of oral antibiotics.  PICC insertion has been ordered.  [facility] Infusion Coordinator will follow pt with ID team while inpatient to [facility] services for home IV ABX at DC.  If patient discharges after hours, please call [phone].     Advance diet  Up with therapy  Incentive Spirometry  Apply ice   Weight Bearing: Weight Bearing as Tolerated (WBAT) RLE Dressings: Maintain Mepilex.   VTE prophylaxis: Resume Eliquis, SCDs, ambulation Dispo: Likely home 1-2 days with home health after mobilized and home infusion services have been arranged.   Prudencio Burly III, PA-C 08/23/2017, 7:39 AM

## 2017-08-23 NOTE — Progress Notes (Signed)
[facility] for Infectious Disease    Date of Admission:  08/22/2017   Total days of antibiotics 2   ID: Wendy [name] is a 67 y.o. female with right prosthetic hip infection POD#1 hip revision with polyethelyne liner exhcnage plus new ceramic head Principal Problem:   Infection of right prosthetic hip joint ([place]) Active Problems:   Hx of CABG   Hypertension   HLD (hyperlipidemia)   COPD (chronic obstructive pulmonary disease) (HCC)   Chronic back pain   GERD (gastroesophageal reflux disease)   PAF (paroxysmal atrial fibrillation) ([place])   Coronary artery disease involving native coronary artery of native heart without angina pectoris   Chronic anticoagulation   CAD S/P multiple PCIs-last one 2016   Acute combined systolic and diastolic heart failure (HCC)   Primary osteoarthritis of hip    Subjective: Afebrile but having some pain associated from surgery. She also feels that her dressing "burning sensation" she reports latex allergy  Medications:  . acetaminophen  1,000 mg Oral Q8H  . apixaban  5 mg Oral BID  . atorvastatin  80 mg Oral q1800  . buPROPion  150 mg Oral Daily  . clopidogrel  75 mg Oral Daily  . docusate sodium  100 mg Oral BID  . ezetimibe  10 mg Oral Daily  . ferrous sulfate  325 mg Oral BID WC  . gabapentin  300 mg Oral TID  . isosorbide mononitrate  60 mg Oral Daily  . metoprolol tartrate  50 mg Oral BID  . pantoprazole  40 mg Oral Daily  . ranolazine  1,000 mg Oral BID  . rifampin  300 mg Oral BID WC  . sertraline  150 mg Oral Daily    Objective: Vital signs in last 24 hours: Temp:  [97.5 F (36.4 C)-98.9 F (37.2 C)] 98.2 F (36.8 C) (06/19 1100) Pulse Rate:  [58-70] 58 (06/19 1100) Resp:  [12-21] 16 (06/19 1100) BP: (114-164)/(51-65) 129/53 (06/19 1100) SpO2:  [95 %-100 %] 95 % (06/19 1100) Physical Exam  Constitutional:  oriented to person, place, and time. appears well-developed and well-nourished. No distress.  HENT: [place]/AT,  PERRLA, no scleral icterus Mouth/Throat: Oropharynx is clear and moist. No oropharyngeal exudate.  Cardiovascular: Normal rate, regular rhythm and normal heart sounds. Exam reveals no gallop and no friction rub.  No murmur heard.  Pulmonary/Chest: Effort normal and breath sounds normal. No respiratory distress.  has no wheezes.  Neck = supple, no nuchal rigidity Hip = accordian drain in place. mepilex dressing in place no surrounding erythema Lymphadenopathy: no cervical adenopathy. No axillary adenopathy Neurological: alert and oriented to person, place, and time.  Skin: Skin is warm and dry. No rash noted. No erythema.  Psychiatric: a normal mood and affect.  behavior is normal.    Lab Results Recent Labs    08/23/17 0419  WBC 8.2  HGB 9.3*  HCT 30.5*  NA 139  K 3.8  CL 106  CO2 27  BUN 13  CREATININE 0.71   Microbiology: Blood cx ngtd Or cx ngtd at 24hr Studies/Results: Dg C-arm 1-60 Min  Result Date: 08/22/2017 CLINICAL DATA:  67 year old post right hip revision. Initial encounter. EXAM: DG C-ARM 61-120 MIN; OPERATIVE RIGHT HIP WITH PELVIS COMPARISON:  08/15/2016 plain film exam. Fluoroscopic time: 6 seconds. FINDINGS: Two intraoperative C-arm views submitted for review after surgery. Right hip prosthesis appears in satisfactory position without complication noted on this frontal projection. IMPRESSION: Post right hip replacement. Electronically Signed   By:  Genia Del M.D.   On: 08/22/2017 18:33   Dg Hip Port Unilat With Pelvis 1v Right  Result Date: 08/23/2017 CLINICAL DATA:  Right hip arthroplasty EXAM: DG HIP (WITH OR WITHOUT PELVIS) 1V PORT RIGHT COMPARISON:  Intraoperative radiographs of the right hip. FINDINGS: Uncemented right total hip arthroplasty is noted with single overlying drain in place. Surgical clips project over the right and left groin. No immediate postoperative complications. Expected postop soft tissue emphysema along the lateral right thigh.  IMPRESSION: Intact right uncemented total hip arthroplasty with drain in place. Electronically Signed   By: Ashley Royalty M.D.   On: 08/23/2017 02:16   Dg Hip Operative Unilat W Or W/o Pelvis Right  Result Date: 08/22/2017 CLINICAL DATA:  67 year old post right hip revision. Initial encounter. EXAM: DG C-ARM 61-120 MIN; OPERATIVE RIGHT HIP WITH PELVIS COMPARISON:  08/15/2016 plain film exam. Fluoroscopic time: 6 seconds. FINDINGS: Two intraoperative C-arm views submitted for review after surgery. Right hip prosthesis appears in satisfactory position without complication noted on this frontal projection. IMPRESSION: Post right hip replacement. Electronically Signed   By: Genia Del M.D.   On: 08/22/2017 18:33   [place] Ekg Site Rite  Result Date: 08/22/2017 If Site Rite image not attached, placement could not be confirmed due to current cardiac rhythm.    Assessment/Plan: Prosthetic joint infection = will need 6 wk of IV vancomycin plus rifampin. We will need to find out what to do with her eliquis and ranexa as they have drug interaction with rifampin. We have reached out to cardiology to see if can bridge with another drug -after 6 wk of IV abtx, she will need an addition 6 wk of oral abtx + depending on inflammatory markers back to WNL - will follow cultures tomorrow to make final recs.  Skin sensitivity = patient is having some irritation reported sensation of allergy to mepilex (latex free) dressing. Will try to change out to different dressing  Pain = defer to primary team for management.  [facility] for Infectious Diseases Cell: [phone] Pager: [phone]  08/23/2017, 8:01 PM

## 2017-08-23 NOTE — Anesthesia Postprocedure Evaluation (Signed)
Anesthesia Post Note  Patient: Wendy [name]  Procedure(s) Performed: TOTAL HIP REVISION (Right Hip)     Patient location during evaluation: PACU Anesthesia Type: General Level of consciousness: awake and alert Pain management: pain level controlled Vital Signs Assessment: post-procedure vital signs reviewed and stable Respiratory status: spontaneous breathing, nonlabored ventilation, respiratory function stable and patient connected to nasal cannula oxygen Cardiovascular status: blood pressure returned to baseline and stable Postop Assessment: no apparent nausea or vomiting Anesthetic complications: no    Last Vitals:  Vitals:   08/23/17 0615 08/23/17 1100  BP:  (!) 129/53  Pulse: 61 (!) 58  Resp:  16  Temp:  36.8 C  SpO2:  95%    Last Pain:  Vitals:   08/23/17 1445  TempSrc:   PainSc: 7                  Lan Entsminger

## 2017-08-23 NOTE — Progress Notes (Signed)
Physical Therapy Treatment Patient Details Name: Wendy [name] MRN: [id number] DOB: February 07, 1951 Today's Date: 08/23/2017    History of Present Illness Pt is a 67 y.o. 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    PT Comments    Pt remains limited by c/o 10/10 R hip and anterior thigh pain; able to amb short distance with RW and perform seated/supine LE therex. Expect pt to progress well with mobility once pain better controlled. Will continue to follow acutely.   Follow Up Recommendations  Follow surgeon's recommendation for DC plan and follow-up therapies;Supervision for mobility/OOB     Equipment Recommendations  None recommended by PT    Recommendations for Other Services       Precautions / Restrictions Precautions Precautions: Fall Restrictions Weight Bearing Restrictions: Yes RLE Weight Bearing: Weight bearing as tolerated    Mobility  Bed Mobility Overal bed mobility: Needs Assistance Bed Mobility: Sit to Supine       Sit to supine: Min assist   General bed mobility comments: Pt attempting to use BUEs and then LLE to assist RLE into bed, but limited due to pain; eventually requiring minA for RLE  Transfers Overall transfer level: Needs assistance Equipment used: Rolling walker (2 wheeled) Transfers: Sit to/from Stand Sit to Stand: Supervision            Ambulation/Gait Ambulation/Gait assistance: Min guard Gait Distance (Feet): 15 Feet Assistive device: Rolling walker (2 wheeled) Gait Pattern/deviations: Step-to pattern;Decreased weight shift to right;Antalgic Gait velocity: Decreased Gait velocity interpretation: <1.8 ft/sec, indicate of risk for recurrent falls General Gait Details: Slow, very antalgic amb with RW and intermittent min guard for balance; significantly limited by c/o 10/10 R hip/anterior thigh pain with minimal WB through RLE   Stairs             Wheelchair Mobility     Modified Rankin (Stroke Patients Only)       Balance Overall balance assessment: Needs assistance   Sitting balance-Leahy Scale: Fair       Standing balance-Leahy Scale: Poor Standing balance comment: Reliant on UE support                            Cognition Arousal/Alertness: Awake/alert Behavior During Therapy: Flat affect Overall Cognitive Status: Within Functional Limits for tasks assessed                                        Exercises Total Joint Exercises Quad Sets: AROM;Right;10 reps;Supine Heel Slides: AAROM;Right;5 reps;Supine Hip ABduction/ADduction: AAROM;Right;10 reps;Supine Long Arc Quad: AROM;Right;10 reps;Seated    General Comments        Pertinent Vitals/Pain Pain Assessment: Faces Faces Pain Scale: Hurts worst Pain Location: R hip Pain Descriptors / Indicators: Grimacing;Sharp;Shooting;Throbbing;Burning;Moaning Pain Intervention(s): Limited activity within patient's tolerance;Monitored during session;Patient requesting pain meds-RN notified    Home Living                      Prior Function            PT Goals (current goals can now be found in the care plan section) Acute Rehab PT Goals Patient Stated Goal: Less pain PT Goal Formulation: With patient Time For Goal Achievement: 09/06/17 Potential to Achieve Goals: Good Progress towards PT goals: Not progressing toward  goals - comment(Limited by pain)    Frequency    7X/week      PT Plan Current plan remains appropriate    Co-evaluation              AM-PAC PT "6 Clicks" Daily Activity  Outcome Measure  Difficulty turning over in bed (including adjusting bedclothes, sheets and blankets)?: A Little Difficulty moving from lying on back to sitting on the side of the bed? : Unable Difficulty sitting down on and standing up from a chair with arms (e.g., wheelchair, bedside commode, etc,.)?: A Little Help needed moving to and from a bed  to chair (including a wheelchair)?: A Little Help needed walking in hospital room?: A Little Help needed climbing 3-5 steps with a railing? : A Little 6 Click Score: 16    End of Session Equipment Utilized During Treatment: Gait belt Activity Tolerance: Patient limited by pain Patient left: in bed;with call bell/phone within reach Nurse Communication: Mobility status;Patient requests pain meds PT Visit Diagnosis: Other abnormalities of gait and mobility (R26.89);Pain Pain - Right/Left: Right Pain - part of body: Hip     Time: 1555-1610 PT Time Calculation (min) (ACUTE ONLY): 15 min  Charges:  $Therapeutic Exercise: 8-22 mins                    G Codes:      Mabeline Caras, PT, DPT Acute Rehab Services  Pager: [place] 08/23/2017, 4:20 PM

## 2017-08-23 NOTE — Evaluation (Signed)
Physical Therapy Evaluation Patient Details Name: Wendy [name] MRN: [id number] DOB: 03-07-1951 Today's Date: 08/23/2017   History of Present Illness  Pt is a 67 y.o. 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   Clinical Impression  Pt presents with an overall decrease in functional mobility secondary to above. PTA, pt indep and lives with family available for 24/7 support. Educ on precautions, positioning, therex, and importance of mobility. Today, pt able to transfer and amb with RW and min guard for balance; further mobility limited by c/o 10/10 pain (RN notified and provided pain medication). Pt would benefit from continued acute PT services to maximize functional mobility and independence prior to d/c home.     Follow Up Recommendations Follow surgeon's recommendation for DC plan and follow-up therapies;Supervision for mobility/OOB    Equipment Recommendations  None recommended by PT    Recommendations for Other Services OT consult     Precautions / Restrictions Precautions Precautions: Fall Restrictions Weight Bearing Restrictions: Yes RLE Weight Bearing: Weight bearing as tolerated      Mobility  Bed Mobility Overal bed mobility: Needs Assistance Bed Mobility: Supine to Sit     Supine to sit: Supervision;HOB elevated     General bed mobility comments: Significant increased time and effort secondary to pain; educ on use of LLE as hook to assist RLE to EOB, pt with good ability to do this  Transfers Overall transfer level: Needs assistance Equipment used: Rolling walker (2 wheeled) Transfers: Sit to/from Stand Sit to Stand: Min guard         General transfer comment: Educ on correct hand placement, no physical assist required  Ambulation/Gait Ambulation/Gait assistance: Min guard Gait Distance (Feet): 20 Feet Assistive device: Rolling walker (2 wheeled) Gait Pattern/deviations: Step-to pattern;Decreased  weight shift to right;Antalgic Gait velocity: Decreased Gait velocity interpretation: <1.8 ft/sec, indicate of risk for recurrent falls General Gait Details: Slow, antalgic amb with RW and intermittent min guard for balance. Pt crying with 10/10 pain limiting further distance  Stairs            Wheelchair Mobility    Modified Rankin (Stroke Patients Only)       Balance Overall balance assessment: Needs assistance   Sitting balance-Leahy Scale: Fair       Standing balance-Leahy Scale: Poor Standing balance comment: Reliant on UE support                             Pertinent Vitals/Pain Pain Assessment: 0-10 Pain Score: 10-Worst pain ever Pain Descriptors / Indicators: Sore;Operative site guarding;Grimacing;Crying Pain Intervention(s): Limited activity within patient's tolerance;Ice applied;RN gave pain meds during session    [place] expects to be discharged to:: Private residence Living Arrangements: Other relatives Available Help at Discharge: Family;Available 24 hours/day Type of Home: Mobile home Home Access: Stairs to enter;Ramped entrance Entrance Stairs-Rails: Right("sturdy metal post thing I always hold onto") Entrance Stairs-Number of Steps: 2 Home Layout: One level Home Equipment: Walker - 2 wheels;Bedside commode;Walker - 4 wheels      Prior Function Level of Independence: Independent               Hand Dominance        Extremity/Trunk Assessment   Upper Extremity Assessment Upper Extremity Assessment: Overall WFL for tasks assessed    Lower Extremity Assessment Lower Extremity Assessment: RLE deficits/detail RLE Deficits / Details: s/p R THA  revision; hip flex <3/5, knee flex/ext at least 3/5 RLE: Unable to fully assess due to pain       Communication   Communication: No difficulties  Cognition Arousal/Alertness: Awake/alert Behavior During Therapy: WFL for tasks assessed/performed Overall Cognitive  Status: Within Functional Limits for tasks assessed                                        General Comments General comments (skin integrity, edema, etc.): SpO2 94% on RA    Exercises General Exercises - Lower Extremity Long Arc Quad: AROM;Right;10 reps;Seated   Assessment/Plan    PT Assessment Patient needs continued PT services  PT Problem List Decreased strength;Decreased range of motion;Decreased activity tolerance;Decreased balance;Decreased mobility;Decreased knowledge of use of DME       PT Treatment Interventions DME instruction;Gait training;Stair training;Functional mobility training;Therapeutic activities;Therapeutic exercise;Balance training;Patient/family education    PT Goals (Current goals can be found in the Care Plan section)  Acute Rehab PT Goals Patient Stated Goal: Less pain PT Goal Formulation: With patient Time For Goal Achievement: 09/06/17 Potential to Achieve Goals: Good    Frequency 7X/week   Barriers to discharge        Co-evaluation               AM-PAC PT "6 Clicks" Daily Activity  Outcome Measure Difficulty turning over in bed (including adjusting bedclothes, sheets and blankets)?: A Little Difficulty moving from lying on back to sitting on the side of the bed? : A Little Difficulty sitting down on and standing up from a chair with arms (e.g., wheelchair, bedside commode, etc,.)?: A Little Help needed moving to and from a bed to chair (including a wheelchair)?: A Little Help needed walking in hospital room?: A Little Help needed climbing 3-5 steps with a railing? : A Little 6 Click Score: 18    End of Session Equipment Utilized During Treatment: Gait belt Activity Tolerance: Patient tolerated treatment well;Patient limited by pain Patient left: in chair;with call bell/phone within reach;with nursing/sitter in room Nurse Communication: Mobility status PT Visit Diagnosis: Other abnormalities of gait and mobility  (R26.89);Pain Pain - Right/Left: Right Pain - part of body: Hip    Time: 0821-0850 PT Time Calculation (min) (ACUTE ONLY): 29 min   Charges:   PT Evaluation $PT Eval Moderate Complexity: 1 Mod PT Treatments $Therapeutic Activity: 8-22 mins   PT G Codes:       Mabeline Caras, PT, DPT Acute Rehab Services  Pager: [place] 08/23/2017, 9:05 AM

## 2017-08-23 NOTE — Progress Notes (Signed)
Alerted by pharmacy regarding drug interaction with rifampin and the eliquis and ranexa. Anticoagulant effect is unable to be determined or safely relied upon with combining the eliquis and rifampin - I have reached out to her cardiologist Dr. [name] to see if there are alternatives we can consider short term for her anticoagulation and rate control for her atrial fib. Awaiting call back.   For now would ask alternative VTE prophylaxis from surgical team for now (SCDs at least).  Janene Madeira, MSN, NP-C [facility] for Infectious [place] [facility] Office: [phone] Pager: [phone] 08/23/2017  1:30 PM

## 2017-08-23 NOTE — Progress Notes (Signed)
PT Cancellation Note  Patient Details Name: Wendy [name] MRN: [id number] DOB: August 23, 1950   Cancelled Treatment:    Reason Eval/Treat Not Completed: Pain limiting ability to participate. Will follow-up for PT treatment as schedule permits.  Mabeline Caras, PT, DPT Acute Rehab Services  Pager: [place] 08/23/2017, 2:52 PM

## 2017-08-24 ENCOUNTER — Encounter (HOSPITAL_COMMUNITY): Payer: Self-pay | Admitting: Cardiology

## 2017-08-24 ENCOUNTER — Other Ambulatory Visit: Payer: Self-pay

## 2017-08-24 DIAGNOSIS — I25718 Atherosclerosis of autologous vein coronary artery bypass graft(s) with other forms of angina pectoris: Secondary | ICD-10-CM

## 2017-08-24 DIAGNOSIS — I480 Paroxysmal atrial fibrillation: Secondary | ICD-10-CM

## 2017-08-24 LAB — CBC
HCT: 28.2 % — ABNORMAL LOW (ref 36.0–46.0)
Hemoglobin: 8.4 g/dL — ABNORMAL LOW (ref 12.0–15.0)
MCH: 26.6 pg (ref 26.0–34.0)
MCHC: 29.8 g/dL — ABNORMAL LOW (ref 30.0–36.0)
MCV: 89.2 fL (ref 78.0–100.0)
Platelets: 162 10*3/uL (ref 150–400)
RBC: 3.16 MIL/uL — ABNORMAL LOW (ref 3.87–5.11)
RDW: 15.7 % — ABNORMAL HIGH (ref 11.5–15.5)
WBC: 6.2 10*3/uL (ref 4.0–10.5)

## 2017-08-24 LAB — C-REACTIVE PROTEIN: CRP: 3.3 mg/dL — ABNORMAL HIGH (ref <1.0)

## 2017-08-24 LAB — SEDIMENTATION RATE: Sed Rate: 40 mm/hr — ABNORMAL HIGH (ref 0–22)

## 2017-08-24 LAB — VANCOMYCIN, TROUGH: Vancomycin Tr: 16 ug/mL (ref 15–20)

## 2017-08-24 LAB — PREPARE RBC (CROSSMATCH)

## 2017-08-24 MED ORDER — WARFARIN - PHARMACIST DOSING INPATIENT: Freq: Every day | Status: DC

## 2017-08-24 MED ORDER — HYDROMORPHONE HCL 2 MG/ML IJ SOLN: 0.5000 mg | INTRAMUSCULAR | Status: DC | PRN

## 2017-08-24 MED ORDER — ENOXAPARIN SODIUM 40 MG/0.4ML ~~LOC~~ SOLN
40.0000 mg | Freq: Every day | SUBCUTANEOUS | Status: DC
  Administered 2017-08-25: 40 mg via SUBCUTANEOUS
  Filled 2017-08-24: qty 0.4

## 2017-08-24 MED ORDER — VANCOMYCIN IV (FOR PTA / DISCHARGE USE ONLY): 1000.0000 mg | Freq: Two times a day (BID) | INTRAVENOUS | 0 refills | Status: DC

## 2017-08-24 MED ORDER — ASPIRIN EC 81 MG PO TBEC
81.0000 mg | DELAYED_RELEASE_TABLET | Freq: Two times a day (BID) | ORAL | Status: DC
  Administered 2017-08-24: 81 mg via ORAL
  Filled 2017-08-24: qty 1

## 2017-08-24 MED ORDER — SODIUM CHLORIDE 0.9% IV SOLUTION
Freq: Once | INTRAVENOUS | Status: AC
  Administered 2017-08-24: 08:00:00 via INTRAVENOUS

## 2017-08-24 MED ORDER — FUROSEMIDE 10 MG/ML IJ SOLN
20.0000 mg | Freq: Once | INTRAMUSCULAR | Status: AC
  Administered 2017-08-24: 20 mg via INTRAVENOUS
  Filled 2017-08-24: qty 2

## 2017-08-24 MED ORDER — WARFARIN SODIUM 5 MG PO TABS
5.0000 mg | ORAL_TABLET | Freq: Once | ORAL | Status: AC
  Administered 2017-08-24: 5 mg via ORAL
  Filled 2017-08-24: qty 1

## 2017-08-24 MED ORDER — ASPIRIN EC 81 MG PO TBEC: 81.0000 mg | DELAYED_RELEASE_TABLET | Freq: Two times a day (BID) | ORAL | 0 refills | Status: DC

## 2017-08-24 NOTE — Progress Notes (Signed)
[facility] for Infectious Disease  Date of Admission:  08/22/2017     Total days of antibiotics 2   Day 2 Vancomycin   Day 2 Rifampin          Patient ID: Wendy [name] is a 67 y.o. F with  Principal Problem:   Infection of right prosthetic hip joint ([place]) Active Problems:   Hx of CABG   Hypertension   HLD (hyperlipidemia)   COPD (chronic obstructive pulmonary disease) (HCC)   Chronic back pain   GERD (gastroesophageal reflux disease)   PAF (paroxysmal atrial fibrillation) ([place])   Coronary artery disease involving native coronary artery of native heart without angina pectoris   Chronic anticoagulation   CAD S/P multiple PCIs-last one 2016   Acute combined systolic and diastolic heart failure (HCC)   Primary osteoarthritis of hip   . acetaminophen  1,000 mg Oral Q8H  . aspirin EC  81 mg Oral BID  . atorvastatin  80 mg Oral q1800  . buPROPion  150 mg Oral Daily  . clopidogrel  75 mg Oral Daily  . docusate sodium  100 mg Oral BID  . ezetimibe  10 mg Oral Daily  . ferrous sulfate  325 mg Oral BID WC  . gabapentin  300 mg Oral TID  . isosorbide mononitrate  60 mg Oral Daily  . metoprolol tartrate  50 mg Oral BID  . pantoprazole  40 mg Oral Daily  . ranolazine  1,000 mg Oral BID  . rifampin  300 mg Oral BID WC  . sertraline  150 mg Oral Daily    SUBJECTIVE: Tearful - not feeling well today. Just finished working with physical therapy. Feels cold.   Allergies  Allergen Reactions  . Benadryl [Diphenhydramine] Shortness Of Breath  . Adhesive [Tape] Other (See Comments)    Burning of skin  . Amoxicillin Diarrhea and Other (See Comments)    Has patient had a PCN reaction causing immediate rash, facial/tongue/throat swelling, SOB or lightheadedness with hypotension: No Has patient had a PCN reaction causing severe rash involving mucus membranes or skin necrosis: No Has patient had a PCN reaction that required hospitalization No Has patient had a PCN  reaction occurring within the last 10 years: Yes -- noted rxn as diarrhea If all of the above answers are "NO", then may proceed with Cephalosporin use.   Yvette Rack [Cyclobenzaprine] Other (See Comments)    Causes restless leg syndrome  . Hydrocodone Itching    OBJECTIVE: Vitals:   08/24/17 0503 08/24/17 0830 08/24/17 0900 08/24/17 1102  BP: (!) 153/55 (!) 130/46 (!) 106/56 95/76  Pulse: 67 69 68 61  Resp:  _0 Temp: 98 F (36.7 C) 98.2 F (36.8 C) 98.4 F (36.9 C) 98.2 F (36.8 C)  TempSrc: Oral Oral Oral Oral  SpO2: 99% 94% 95% 98%  Weight:      Height:       Body mass index is 30.99 kg/m.  Physical Exam  Constitutional: She is oriented to person, place, and time. She appears well-developed and well-nourished.  Resting quietly in bed. Tearful and appears uncomfortable.   HENT:  Mouth/Throat: Mucous membranes are normal. No oral lesions. Normal dentition. No dental abscesses.  Cardiovascular: Normal rate and regular rhythm.  Murmur (soft systolic ) heard. Pulmonary/Chest: Effort normal and breath sounds normal. No respiratory distress.  Abdominal: Soft. Bowel sounds are normal. She exhibits no distension. There is no tenderness.  Musculoskeletal:  She exhibits tenderness (R hip ).       Right hip: She exhibits decreased strength and tenderness.       Legs: Lymphadenopathy:    She has no cervical adenopathy.  Neurological: She is alert and oriented to person, place, and time.  Skin: Skin is warm and dry. No rash noted.  Psychiatric: She has a normal mood and affect. Judgment normal.  Vitals reviewed.   Lab Results Lab Results  Component Value Date   WBC 6.2 08/24/2017   HGB 8.4 (L) 08/24/2017   HCT 28.2 (L) 08/24/2017   MCV 89.2 08/24/2017   PLT 162 08/24/2017    Lab Results  Component Value Date   CREATININE 0.71 08/23/2017   BUN 13 08/23/2017   NA 139 08/23/2017   K 3.8 08/23/2017   CL 106 08/23/2017   CO2 27 08/23/2017    Lab Results    Component Value Date   ALT 8 12/05/2016   AST 8 12/05/2016   ALKPHOS 67 12/05/2016   BILITOT 0.3 12/05/2016    Sed Rate (mm/hr)  Date Value  08/24/2017 40 (H)   CRP (mg/dL)  Date Value  08/24/2017 3.3 (H)    Microbiology: Recent Results (from the past 240 hour(s))  Surgical pcr screen     Status: None   Collection Time: 08/18/17  3:23 PM  Result Value Ref Range Status   MRSA, PCR NEGATIVE NEGATIVE Final   Staphylococcus aureus NEGATIVE NEGATIVE Final    Comment: (NOTE) The Xpert SA Assay (FDA approved for NASAL specimens in patients 55 years of age and older), is one component of a comprehensive surveillance program. It is not intended to diagnose infection nor to guide or monitor treatment. Performed at [facility], [place] [address] [place], [place] [address]   Aerobic/Anaerobic Culture (surgical/deep wound)     Status: None (Preliminary result)   Collection Time: 08/22/17  6:02 PM  Result Value Ref Range Status   Specimen Description JOINT FLUID RIGHT HIP  Final   Special Requests NONE  Final   Gram Stain   Final    ABUNDANT WBC PRESENT,BOTH PMN AND MONONUCLEAR NO ORGANISMS SEEN    Culture   Final    NO GROWTH 2 DAYS NO ANAEROBES ISOLATED; CULTURE IN PROGRESS FOR 5 DAYS Performed at [facility], [place] [address], [place], [place] [address]    Report Status PENDING  Incomplete     ASSESSMENT: DAMYIAH [name] is a 67 y.o. female with right prosthetic joint infection. She is now POD #2 from polyethylene liner and ceramic head exchange and irrigation. Awaiting call back from cardiology regarding recs for dual antiplatelet therapy for 6 w with plavix / 81 ASA for anticoagulation and rec for ranexa substitution while she is on rifampin. Will need to monitor LFTs on therapy. She should be ready for D/C home once these are in place.    PLAN: 1. Continue vancomycin. PICC in place.  2. Continue Rifampin - awaiting call back from Cardiology regarding  acceptable substitutions for ranexa and eliquis.  3. OPAT orders entered.    OPAT ORDERS:  Diagnosis: Right Prosthetic Joint Infection   Culture Result: no growth on culture   Allergies  Allergen Reactions  . Benadryl [Diphenhydramine] Shortness Of Breath  . Adhesive [Tape] Other (See Comments)    Burning of skin  . Amoxicillin Diarrhea and Other (See Comments)    Has patient had a PCN reaction causing immediate rash, facial/tongue/throat swelling, SOB or  lightheadedness with hypotension: No Has patient had a PCN reaction causing severe rash involving mucus membranes or skin necrosis: No Has patient had a PCN reaction that required hospitalization No Has patient had a PCN reaction occurring within the last 10 years: Yes -- noted rxn as diarrhea If all of the above answers are "NO", then may proceed with Cephalosporin use.   Yvette Rack [Cyclobenzaprine] Other (See Comments)    Causes restless leg syndrome  . Hydrocodone Itching    Discharge antibiotics: Per pharmacy protocol Vancomycin  trough 15-20   + Rifampin 300 mg BID   Duration: 6 weeks   End Date: July 31st   [place] and Maintenance Per Protocol _x_ Please pull PIC at completion of IV antibiotics __ Please leave PIC in place until doctor has seen patient or been notified  Labs weekly while on IV antibiotics: _x_ CBC with differential _x_ BMP TWICE WEEKLY** _x_  Liver Function WEEKLY  _x_ CRP _x_ ESR _x_ Vancomycin trough  Fax weekly labs to [phone]  Clinic Follow Up Appt: 4 weeks with Dr. Baxter Flattery or Colletta Maryland, NP @ [facility], MSN, NP-C [facility] Cell: [phone] Pager: [phone]  08/24/2017  2:10 PM

## 2017-08-24 NOTE — Progress Notes (Signed)
ANTICOAGULATION CONSULT NOTE - Initial Consult  Pharmacy Consult for coumadin Indication: atrial fibrillation  Allergies  Allergen Reactions  . Benadryl [Diphenhydramine] Shortness Of Breath  . Adhesive [Tape] Other (See Comments)    Burning of skin  . Amoxicillin Diarrhea and Other (See Comments)    Has patient had a PCN reaction causing immediate rash, facial/tongue/throat swelling, SOB or lightheadedness with hypotension: No Has patient had a PCN reaction causing severe rash involving mucus membranes or skin necrosis: No Has patient had a PCN reaction that required hospitalization No Has patient had a PCN reaction occurring within the last 10 years: Yes -- noted rxn as diarrhea If all of the above answers are "NO", then may proceed with Cephalosporin use.   Wendy [name] [Cyclobenzaprine] Other (See Comments)    Causes restless leg syndrome  . Hydrocodone Itching    Patient Measurements: Height: 5\' 1"  (154.9 cm) Weight: 164 lb (74.4 kg) IBW/kg (Calculated) : 47.8   Vital Signs: Temp: 97.9 F (36.6 C) (06/20 1500) Temp Source: Oral (06/20 1500) BP: 105/42 (06/20 1500) Pulse Rate: 59 (06/20 1500)  Labs: Recent Labs    08/22/17 1137 08/23/17 0419 08/24/17 0430  HGB  --  9.3* 8.4*  HCT  --  30.5* 28.2*  PLT  --  183 162  LABPROT 14.4  --   --   INR 1.13  --   --   CREATININE  --  0.71  --     Estimated Creatinine Clearance: 64.6 mL/min (by C-G formula based on SCr of 0.71 mg/dL).   Medical History: Past Medical History:  Diagnosis Date  . Anemia   . Anginal pain ([place])    jaw pain 07/2016, s/p 07/15/16 nuclear stress test  . Anxiety   . Arthritis   . CAD (coronary artery disease), native coronary artery    a. Hx CABG 1998, last PCI 2013 b.LHC 07/2013:  EF 55-60%, L-LAD prob atretic, no sig disease in LAD, S-OM1 ok with patent stent, S-dRCA ok => med Rx. c. LHC 01/2015 DES to SVG-RCA.  Marland Kitchen Chronic back pain   . Chronic leg pain   . COPD (chronic obstructive  pulmonary disease) ([place])   . Fall 08/2016  . GERD (gastroesophageal reflux disease)   . Glucose intolerance (impaired glucose tolerance)   . HLD (hyperlipidemia)   . Hypertension   . Myocardial infarction ([place])   . PAD (peripheral artery disease) ([place])   . PAF (paroxysmal atrial fibrillation) ([place])    a. 09/2013 post-op b. 01/2015  . Peripheral neuropathy   . Shortness of breath    when walking  . Sleep apnea    uses a cpap  . Wears glasses     Assessment: 64 YOF with hx of afib, on eliquis PTA, now with PJI, will need 6 weeks of vanc + rifampin. Will switch eliquis to coumadin for anticoagulation, d/t drug interaction with rifampin. Last dose eliquis was given last night at 2045.   Goal of Therapy:  INR 2-3 Monitor platelets by anticoagulation protocol: Yes   Plan:  - Coumadin 5 mg po x q tonight - Might need higher dose d/t rifampin - Daily PT/INR  Maryanna Shape, PharmD, BCPS  Clinical Pharmacist  Pager: [phone]   08/24/2017,4:25 PM

## 2017-08-24 NOTE — Progress Notes (Addendum)
Subjective: Patient reports pain as moderate.  Tolerating diet.  Urinating.  +Flatus.  No CP, SOB. Early mobilization w/ therapy.  Objective:   VITALS:   Vitals:   08/23/17 0615 08/23/17 1100 08/23/17 2023 08/24/17 0503  BP:  (!) 129/53 (!) 131/45 (!) 153/55  Pulse: 61 (!) 58 62 67  Resp:  16 16   Temp:  98.2 F (36.8 C) 98.1 F (36.7 C) 98 F (36.7 C)  TempSrc:  Oral Oral Oral  SpO2:  95% 98% 99%  Weight:      Height:       CBC Latest Ref Rng & Units 08/24/2017 08/23/2017 08/18/2017  WBC 4.0 - 10.5 K/uL 6.2 8.2 6.2  Hemoglobin 12.0 - 15.0 g/dL 8.4(L) 9.3(L) 10.8(L)  Hematocrit 36.0 - 46.0 % 28.2(L) 30.5(L) 35.6(L)  Platelets 150 - 400 K/uL 162 183 208   BMP Latest Ref Rng & Units 08/23/2017 08/18/2017 07/03/2017  Glucose 65 - 99 mg/dL 122(H) 95 89  BUN 6 - 20 mg/dL 13 16 13   Creatinine 0.44 - 1.00 mg/dL 0.71 0.74 0.88  BUN/Creat Ratio 12 - 28 - - 15  Sodium 135 - 145 mmol/L 139 141 146(H)  Potassium 3.5 - 5.1 mmol/L 3.8 3.9 4.8  Chloride 101 - 111 mmol/L 106 104 108(H)  CO2 22 - 32 mmol/L 27 28 25   Calcium 8.9 - 10.3 mg/dL 8.2(L) 8.9 9.3   Intake/Output      06/19 0701 - 06/20 0700   P.O. 120   Total Intake(mL/kg) 120 (1.6)   Urine (mL/kg/hr) 550 (0.3)   Drains 80   Total Output 630   Net -510       Urine Occurrence 5 x      Physical Exam: General: NAD.  Upright in bed on arrival.  Calm and conversant.  No increased work of breathing.  MSK RLE: Neurovascularly intact Sensation intact distally Feet warm Dorsiflexion/Plantar flexion intact Incision: dressing C/D/I Hemovac in place with minimal bloody/serosanguineous drainage   Assessment / Plan: 2 Days Post-Op  S/P Procedure(s) (LRB): TOTAL HIP REVISION (Right) by Dr. Ernesta Amble. [name] on 08/22/2017  Principal Problem:   Infection of right prosthetic hip joint ([place]) Active Problems:   Hx of CABG   Hypertension   HLD (hyperlipidemia)   COPD (chronic obstructive pulmonary disease) (HCC)  Chronic back pain   GERD (gastroesophageal reflux disease)   PAF (paroxysmal atrial fibrillation) ([place])   Coronary artery disease involving native coronary artery of native heart without angina pectoris   Chronic anticoagulation   CAD S/P multiple PCIs-last one 2016   Acute combined systolic and diastolic heart failure (HCC)   Primary osteoarthritis of hip   Acute Blood Loss Anemia -hemoglobin down again slightly today: 8.4< 9.3<10.8.  No sign of active bleeding.  Likely with delutional component.  Platelets within normal limits.  She is feeling weak in bed and with mobilization.  I discussed transfusion of 1 unit today given heart history and symptoms.  The patient agrees with this.  Right prosthetic hip joint infection approximately 1 year after Right THA.  Now status post revision hip arthroplasty 08/22/2017   Difficult pain control.  Improving.  I believe this will continue to significantly improve now that Hemovac/drain is out.   Continue antibiotic therapy per infectious disease: Rifampin 300 mg twice daily.  Vancomycin per pharmacy.  Adjust based on cultures.  Planning for 6 weeks of IV therapy then 6 weeks of oral antibiotics.  PICC insertion has been placed.  [facility] Infusion Coordinator will follow pt with ID team while inpatient to [facility] for home IV ABX at DC.  If patient discharges after hours, please call [phone].  --- Interaction between Eliquis and rifampin noted.  Coumadin would also not be a good option due to similar interaction.  ID has not yet heard from cardiology regarding recommended ABX interaction w/ Eliquis.  She is on Plavix (previously on both Plavix and Eliquis).    -Aspirin 81 mg twice daily would be sufficient for DVT prophylaxis from an orthopedic perspective if unable to take NOAC.  This may be the best balance of risk to allow for appropriate antibiotic therapy if cardiology is on board with dual platelet therapy  to cover her A. Fib - It seems that she has been in NSR.  All of the above including various risks and benefits were discussed with the patient this morning.  She verbalized understanding and agrees with plan for aspirin twice daily and continuation of Plavix.    Up with therapy  Incentive Spirometry  Apply ice  I removed drain today-continue absorbent pressure dressing until follow-up   Weight Bearing: Weight Bearing as Tolerated (WBAT) RLE Dressings: Maintain Mepilex and absorbent pressure dressing.   VTE prophylaxis: Aspirin 81 mg daily for 6 weeks, SCDs, ambulation.  Continue Plavix Dispo: Home later today after therapy as long as home administration of IV antibiotics has been arranged.   Wendy Elizabeth Martensen III, PA-C 08/24/2017, 6:28 AM

## 2017-08-24 NOTE — Discharge Summary (Signed)
Discharge Summary  Patient ID: TENSLEY WERY MRN: [id number] DOB/AGE: 66/06/1950 66 y.o.  Admit date: 08/22/2017 Discharge date: 08/24/2017  Admission Diagnoses:  Infection of right prosthetic hip joint [facility])  Discharge Diagnoses:  Principal Problem:   Infection of right prosthetic hip joint ([place]) Active Problems:   Hx of CABG   Hypertension   HLD (hyperlipidemia)   COPD (chronic obstructive pulmonary disease) (HCC)   Chronic back pain   GERD (gastroesophageal reflux disease)   PAF (paroxysmal atrial fibrillation) ([place])   Coronary artery disease involving native coronary artery of native heart without angina pectoris   Chronic anticoagulation   CAD S/P multiple PCIs-last one 2016   Acute combined systolic and diastolic heart failure (HCC)   Primary osteoarthritis of hip   Past Medical History:  Diagnosis Date  . Anemia   . Anginal pain ([place])    jaw pain 07/2016, s/p 07/15/16 nuclear stress test  . Anxiety   . Arthritis   . CAD (coronary artery disease), native coronary artery    a. Hx CABG 1998, last PCI 2013 b.LHC 07/2013:  EF 55-60%, L-LAD prob atretic, no sig disease in LAD, S-OM1 ok with patent stent, S-dRCA ok => med Rx. c. LHC 01/2015 DES to SVG-RCA.  Marland Kitchen Chronic back pain   . Chronic leg pain   . COPD (chronic obstructive pulmonary disease) ([place])   . Fall 08/2016  . GERD (gastroesophageal reflux disease)   . Glucose intolerance (impaired glucose tolerance)   . HLD (hyperlipidemia)   . Hypertension   . Myocardial infarction ([place])   . PAD (peripheral artery disease) ([place])   . PAF (paroxysmal atrial fibrillation) ([place])    a. 09/2013 post-op b. 01/2015  . Peripheral neuropathy   . Shortness of breath    when walking  . Sleep apnea    uses a cpap  . Wears glasses     Surgeries: Procedure(s): TOTAL HIP REVISION on 08/22/2017   Consultants (if any):   Discharged Condition: Improved  Hospital Course: LEXANY BELKNAP is an 67 y.o. female who was admitted  08/22/2017 with a diagnosis of Infection of right prosthetic hip joint ([place]) and went to the operating room on 08/22/2017 and underwent the above named procedures.    She was given perioperative antibiotics:  Anti-infectives (From admission, onward)   Start     Dose/Rate Route Frequency Ordered Stop   08/24/17 0000  vancomycin IVPB     1,000 mg Intravenous Every 12 hours 08/24/17 0626     08/23/17 0800  rifampin (RIFADIN) capsule 300 mg     300 mg Oral 2 times daily with meals 08/22/17 2327     08/23/17 0000  vancomycin (VANCOCIN) IVPB 1000 mg/200 mL premix     1,000 mg 200 mL/hr over 60 Minutes Intravenous Every 12 hours 08/22/17 2355     08/22/17 1330  ceFAZolin (ANCEF) IVPB 2g/100 mL premix     2 g 200 mL/hr over 30 Minutes Intravenous On call to O.R. 08/22/17 1022 08/22/17 1610   08/22/17 1230  vancomycin (VANCOCIN) IVPB 1000 mg/200 mL premix     1,000 mg 200 mL/hr over 60 Minutes Intravenous On call to O.R. 08/22/17 1022 08/22/17 1359   08/22/17 1029  ceFAZolin (ANCEF) 2-4 GM/100ML-% IVPB    Note to Pharmacy:  Laurita Quint   : cabinet override      08/22/17 1029 08/22/17 1540   08/22/17 0000  rifampin (RIFADIN) 300 MG capsule     300 mg Oral 2  times daily 08/22/17 1900 10/03/17 2359    .  She was given sequential compression devices, early ambulation, and Eliquis changed to ASA 81 mg BID for DVT prophylaxis.  Plavix Resumed POD1.  She benefited maximally from the hospital stay and there were no complications.  She mobilized with therapy and pain control steadily improved.  She had no signs of active bleeding postop and Hemovac was removed POD2.  She did receive Unit PRBC for symptomatic anemia POD2.  Iron was recommended preoperatively for anemia and this was continued.    She was evaluated by Infectious Disease Team who made the following recommendations:  Discharge antibiotics: Per pharmacy protocol Vancomycin  trough 15-20              + Rifampin 300 mg BID   Duration: 6  weeks   End Date: July 31st   [place] and Maintenance Per Protocol _x_ Please pull PIC at completion of IV antibiotics __ Please leave PIC in place until doctor has seen patient or been notified  Labs weekly while on IV antibiotics: _x_ CBC with differential _x_ BMP TWICE WEEKLY** _x_  Liver Function WEEKLY  _x_ CRP _x_ ESR _x_ Vancomycin trough  Fax weekly labs to [phone]  Clinic Follow Up Appt: 4 weeks with Dr. Baxter Flattery or Colletta Maryland, NP @ [facility]       Recent vital signs:  Vitals:   08/24/17 0900 08/24/17 1102  BP: (!) 106/56 95/76  Pulse: 68 61  Resp: 18 18  Temp: 98.4 F (36.9 C) 98.2 F (36.8 C)  SpO2: 95% 98%    Recent laboratory studies:  Lab Results  Component Value Date   HGB 8.4 (L) 08/24/2017   HGB 9.3 (L) 08/23/2017   HGB 10.8 (L) 08/18/2017   Lab Results  Component Value Date   WBC 6.2 08/24/2017   PLT 162 08/24/2017   Lab Results  Component Value Date   INR 1.13 08/22/2017   Lab Results  Component Value Date   NA 139 08/23/2017   K 3.8 08/23/2017   CL 106 08/23/2017   CO2 27 08/23/2017   BUN 13 08/23/2017   CREATININE 0.71 08/23/2017   GLUCOSE 122 (H) 08/23/2017    Discharge Medications:   Allergies as of 08/24/2017      Reactions   Benadryl [diphenhydramine] Shortness Of Breath   Adhesive [tape] Other (See Comments)   Burning of skin   Amoxicillin Diarrhea, Other (See Comments)   Has patient had a PCN reaction causing immediate rash, facial/tongue/throat swelling, SOB or lightheadedness with hypotension: No Has patient had a PCN reaction causing severe rash involving mucus membranes or skin necrosis: No Has patient had a PCN reaction that required hospitalization No Has patient had a PCN reaction occurring within the last 10 years: Yes -- noted rxn as diarrhea If all of the above answers are "NO", then may proceed with Cephalosporin use.   Flexeril [cyclobenzaprine] Other (See Comments)   Causes restless leg syndrome    Hydrocodone Itching      Medication List    STOP taking these medications   apixaban 5 MG Tabs tablet Commonly known as:  ELIQUIS   doxycycline 100 MG capsule Commonly known as:  VIBRAMYCIN   oxyCODONE-acetaminophen 5-325 MG tablet Commonly known as:  ROXICET     TAKE these medications   acetaminophen 500 MG tablet Commonly known as:  TYLENOL Take 2 tablets (1,000 mg total) by mouth every 8 (eight) hours for 14 days. For Pain. What  changed:    when to take this  reasons to take this  additional instructions   albuterol 108 (90 Base) MCG/ACT inhaler Commonly known as:  PROVENTIL HFA;VENTOLIN HFA Inhale 2 puffs into the lungs every 6 (six) hours as needed for wheezing or shortness of breath.   ALPRAZolam 0.5 MG tablet Commonly known as:  XANAX Take 0.5 mg by mouth 3 (three) times daily as needed for anxiety.   aspirin EC 81 MG tablet Take 1 tablet (81 mg total) by mouth 2 (two) times daily. For DVT prophylaxis   atorvastatin 80 MG tablet Commonly known as:  LIPITOR Take 1 tablet (80 mg total) by mouth daily at 6 PM.   baclofen 20 MG tablet Commonly known as:  LIORESAL Take 20 mg by mouth 3 (three) times daily as needed for muscle spasms.   buPROPion 150 MG 24 hr tablet Commonly known as:  WELLBUTRIN XL Take 150 mg by mouth daily.   cetirizine 10 MG tablet Commonly known as:  ZYRTEC Take 10 mg by mouth daily as needed for allergies.   cholecalciferol 1000 units tablet Commonly known as:  VITAMIN D Take 2,000 Units by mouth daily.   clopidogrel 75 MG tablet Commonly known as:  PLAVIX Take 75 mg by mouth daily.   diclofenac sodium 1 % Gel Commonly known as:  VOLTAREN Apply 1 g topically daily as needed (pain).   docusate sodium 100 MG capsule Commonly known as:  COLACE Take 1 capsule (100 mg total) by mouth 2 (two) times daily. To prevent constipation while taking pain medication.   ezetimibe 10 MG tablet Commonly known as:  ZETIA Take 10 mg by  mouth daily.   furosemide 20 MG tablet Commonly known as:  LASIX Take 1 tablet (20 mg total) daily by mouth. What changed:    when to take this  reasons to take this   isosorbide mononitrate 60 MG 24 hr tablet Commonly known as:  IMDUR Take 1 tablet (60 mg total) by mouth every morning.   metoprolol tartrate 50 MG tablet Commonly known as:  LOPRESSOR Take 1 tablet (50 mg total) by mouth 2 (two) times daily.   nitroGLYCERIN 0.4 MG SL tablet Commonly known as:  NITROSTAT Place 1 tablet (0.4 mg total) under the tongue every 5 (five) minutes as needed for chest pain.   oxyCODONE 5 MG immediate release tablet Commonly known as:  ROXICODONE Take 1 tablet (5 mg total) by mouth every 4 (four) hours as needed for breakthrough pain. What changed:    when to take this  reasons to take this   pantoprazole 40 MG tablet Commonly known as:  PROTONIX Take 1 tablet (40 mg total) daily by mouth.   pramipexole 0.25 MG tablet Commonly known as:  MIRAPEX Take 0.25 mg by mouth at bedtime as needed (for restless leg).   RANEXA 1000 MG SR tablet Generic drug:  ranolazine Take 1,000 mg by mouth 2 (two) times daily.   rifampin 300 MG capsule Commonly known as:  RIFADIN Take 1 capsule (300 mg total) by mouth 2 (two) times daily.   sertraline 100 MG tablet Commonly known as:  ZOLOFT Take 150 mg by mouth daily.   vancomycin IVPB Inject 1,000 mg into the vein every 12 (twelve) hours. Indication: right hip infection Last Day of Therapy:  10/04/17 Labs - Sunday/Monday:  CBC/D, BMP, and vancomycin trough. Labs - Thursday:  BMP and vancomycin trough Labs - Every other week:  ESR and CRP  Home Infusion Instuctions  (From admission, onward)        Start     Ordered   08/24/17 0000  Home infusion instructions [facility] May follow [place] Dosing Protocol; May administer Cathflo as needed to maintain patency of vascular access device.; Flushing of vascular access  device: per [facility] Protocol: 0.9% NaCl pre/post medica...    Question Answer Comment  Instructions May follow [place] Dosing Protocol   Instructions May administer Cathflo as needed to maintain patency of vascular access device.   Instructions Flushing of vascular access device: per [facility] Protocol: 0.9% NaCl pre/post medication administration and prn patency; Heparin 100 u/ml, 94m for implanted ports and Heparin 10u/ml, 559mfor all other central venous catheters.   Instructions May follow [facility] Anaphylaxis Protocol for First Dose Administration in the home: 0.9% NaCl at 25-50 ml/hr to maintain IV access for protocol meds. Epinephrine 0.3 ml IV/IM PRN and Benadryl 25-50 IV/IM PRN s/s of anaphylaxis.   Instructions [facility] Infusion Coordinator (RN) to assist per patient IV care needs in the home PRN.      08/24/17 0626      Diagnostic Studies: Dg C-arm 1-60 Min  Result Date: 08/22/2017 CLINICAL DATA:  [age]ear old post right hip revision. Initial encounter. EXAM: DG C-ARM 61-120 MIN; OPERATIVE RIGHT HIP WITH PELVIS COMPARISON:  08/15/2016 plain film exam. Fluoroscopic time: 6 seconds. FINDINGS: Two intraoperative C-arm views submitted for review after surgery. Right hip prosthesis appears in satisfactory position without complication noted on this frontal projection. IMPRESSION: Post right hip replacement. Electronically Signed   By: StGenia Del.D.   On: 08/22/2017 18:33   Dg Hip Port Unilat With Pelvis 1v Right  Result Date: 08/23/2017 CLINICAL DATA:  Right hip arthroplasty EXAM: DG HIP (WITH OR WITHOUT PELVIS) 1V PORT RIGHT COMPARISON:  Intraoperative radiographs of the right hip. FINDINGS: Uncemented right total hip arthroplasty is noted with single overlying drain in place. Surgical clips project over the right and left groin. No immediate postoperative complications. Expected postop soft tissue emphysema along the lateral right thigh. IMPRESSION: Intact right uncemented total hip  arthroplasty with drain in place. Electronically Signed   By: DaAshley Royalty.D.   On: 08/23/2017 02:16   Dg Hip Operative Unilat W Or W/o Pelvis Right  Result Date: 08/22/2017 CLINICAL DATA:  [age]ear old post right hip revision. Initial encounter. EXAM: DG C-ARM 61-120 MIN; OPERATIVE RIGHT HIP WITH PELVIS COMPARISON:  08/15/2016 plain film exam. Fluoroscopic time: 6 seconds. FINDINGS: Two intraoperative C-arm views submitted for review after surgery. Right hip prosthesis appears in satisfactory position without complication noted on this frontal projection. IMPRESSION: Post right hip replacement. Electronically Signed   By: StGenia Del.D.   On: 08/22/2017 18:33   Us[place]oreakg Site Rite  Result Date: 08/22/2017 If Site Rite image not attached, placement could not be confirmed due to current cardiac rhythm.   Disposition: Discharge disposition: 01-Home or Self Care       Discharge Instructions    Home infusion instructions [facility] follow [facility] Protocol; May administer Cathflo as needed to maintain patency of vascular access device.; Flushing of vascular access device: [facility]: 0.9% NaCl pre/post medica...   Complete by:  As directed    Instructions:  May follow [facility] Protocol   Instructions:  May administer Cathflo as needed to maintain patency of vascular access device.   Instructions:  Flushing of vascular access device: [facility]: 0.9%  NaCl pre/post medication administration and prn patency; Heparin 100 u/ml, 65m for implanted ports and Heparin 10u/ml, 521mfor all other central venous catheters.   Instructions:  May follow [facility] Anaphylaxis Protocol for First Dose Administration in the home: 0.9% NaCl at 25-50 ml/hr to maintain IV access for protocol meds. Epinephrine 0.3 ml IV/IM PRN and Benadryl 25-50 IV/IM PRN s/s of anaphylaxis.   Instructions:  [facility] Coordinator (RN) to assist per patient IV care needs in the  home PRN.      Follow-up Information    MuRenette ButtersMD Follow up.   Specialty:  Orthopedic Surgery Contact information: [address] [phone]        [facility]. Call in 4 week(s).   Specialty:  Infectious Diseases Why:  Please call for follow up appointment in 4 weeks with Dr. SnBaxter Flatteryr StColletta MarylandNP  Contact information: [address] [phone]         Signed: HePrudencio BurlyII PA-C 08/24/2017, 2:31 PM

## 2017-08-24 NOTE — Progress Notes (Signed)
Pharmacy Antibiotic Note  Wendy [name] is a 67 y.o. female admitted on 08/22/2017 with right hip pain and redness, found to have JPI which is nor s/p revision.  Pharmacy has been consulted for vancomycin dosing.  Renal function is stable and vancomycin trough is therapeutic.  Afebrile, WBC WNL.  Plan: Continue vanc 1gm IV Q12H Rifampin 300mg  PO BID per ID Monitor renal fxn, clinical progress, weekly vanc trough   Height: 5\' 1"  (154.9 cm) Weight: 164 lb (74.4 kg) IBW/kg (Calculated) : 47.8  Temp (24hrs), Avg:98.2 F (36.8 C), Min:98 F (36.7 C), Max:98.4 F (36.9 C)  Recent Labs  Lab 08/18/17 1524 08/23/17 0419 08/24/17 0430 08/24/17 1106  WBC 6.2 8.2 6.2  --   CREATININE 0.74 0.71  --   --   VANCOTROUGH  --   --   --  16    Estimated Creatinine Clearance: 64.6 mL/min (by C-G formula based on SCr of 0.71 mg/dL).    Allergies  Allergen Reactions  . Benadryl [Diphenhydramine] Shortness Of Breath  . Adhesive [Tape] Other (See Comments)    Burning of skin  . Amoxicillin Diarrhea and Other (See Comments)    Has patient had a PCN reaction causing immediate rash, facial/tongue/throat swelling, SOB or lightheadedness with hypotension: No Has patient had a PCN reaction causing severe rash involving mucus membranes or skin necrosis: No Has patient had a PCN reaction that required hospitalization No Has patient had a PCN reaction occurring within the last 10 years: Yes -- noted rxn as diarrhea If all of the above answers are "NO", then may proceed with Cephalosporin use.   Yvette Rack [Cyclobenzaprine] Other (See Comments)    Causes restless leg syndrome  . Hydrocodone Itching     Vanc 6/17 >> Rifampin 6/18 >>   6/20 VT = 16 mcg/mL on 1g q12 >> no change  6/18 right hip joint fluid - NGTD   Anthonella Klausner D. Mina Marble, PharmD, BCPS, [place] Pager:  [phone] 08/24/2017, 12:42 PM

## 2017-08-24 NOTE — Care Management Note (Addendum)
Case Management Note  Patient Details  Name: Wendy [name] MRN: [id number] Date of Birth: [birth year]  Subjective/Objective:  67 yr old female right total hip revision. Patient admitted with infection of right prostethic hip joint.                    Action/Plan: Case manager spoke with patient concerning discharge plan and DME.  Patient has RW and 3in1. Will go home on IV antibiotics, PICC line has been inserted.  Case manager confirmed [place] with [place] Liaison concerning antibiotics. [facility] RN and PT.Marland Kitchen    Expected Discharge Date:  08/24/17               Expected Discharge Plan:  [place]  In-House Referral:     Discharge planning Services  CM Consult  Post Acute Care Choice:  Home Health Choice offered to:  Patient  DME Arranged:  IV pump/equipment(has RW and 3in1) DME Agency:  [place] Arranged:  RN, IV Antibiotics,  [place] Agency:  [facility] Status of Service:  In process, will continue to follow  If discussed at Long Length of Stay Meetings, dates discussed:     Additional Comments: 08/25/17 Case manager informed that [facility] is not able to provide services for patient at discharge. [facility] has been contacted and will provide [facility] RN for IV antibiotics and PT.  Ninfa Meeker, RN 08/24/2017, 2:50 PM

## 2017-08-24 NOTE — Progress Notes (Signed)
Occupational Therapy Treatment Patient Details Name: Wendy [name] MRN: [id number] DOB: Jun 17, 1950 Today's Date: 08/24/2017    History of present illness Pt is a 67 y.o. 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    OT comments  Patient progressing well, cooperative but limited by pain in R hip.  Patient demonstrates ability to complete toilet transfer to [facility] with min guard assist requiring cueing for safety awareness due to urgency.  Demonstrating improving balance, washing hands at sink with 0 hand support upright given min guard assist.  Patient plans to dc home with the support of her aunt today, agreeable to recommendations of supervision for mobility, transfers and ADLs.  Patient has DME needed. Will continue to follow while admitted.    Follow Up Recommendations  No OT follow up;Supervision/Assistance - 24 hour    Equipment Recommendations  None recommended by OT(all needs met)    Recommendations for Other Services      Precautions / Restrictions Precautions Precautions: Fall Restrictions Weight Bearing Restrictions: Yes RLE Weight Bearing: Weight bearing as tolerated       Mobility Bed Mobility Overal bed mobility: Needs Assistance Bed Mobility: Sit to Supine;Supine to Sit     Supine to sit: HOB elevated;Min assist Sit to supine: Supervision   General bed mobility comments: min assist to ascend trunk to sitting EOB (urgency with urination), increased time for EOB to supine   Transfers Overall transfer level: Needs assistance Equipment used: Rolling walker (2 wheeled) Transfers: Sit to/from Stand Sit to Stand: Min guard         General transfer comment: stand pivot to BSC no RW, cueing for safety; min guard amulation to/from restroom to wash hands     Balance Overall balance assessment: Needs assistance Sitting-balance support: No upper extremity supported;Feet supported Sitting balance-Leahy Scale:  Fair     Standing balance support: During functional activity;No upper extremity supported Standing balance-Leahy Scale: Fair Standing balance comment: washing hands with 0 hand support, min guard for safety                            ADL either performed or assessed with clinical judgement   ADL Overall ADL's : Needs assistance/impaired     Grooming: Min guard;Standing;Wash/dry hands Grooming Details (indicate cue type and reason): 0 hand support, improving balance                  Toilet Transfer: Min Art therapist Details (indicate cue type and reason): stand pivot to [facility] due to urgency, min cueing for safety   Toileting- Clothing Manipulation and Hygiene: Supervision/safety;Sit to/from stand       Functional mobility during ADLs: Min guard;Rolling walker General ADL Comments: patient educated on safety with ADL, having supervision with mobility and transfers; patient reports she will complete basin bath at discharge, educated on having a chair nearby for safety      Vision Baseline Vision/History: Wears glasses Patient Visual Report: No change from baseline Vision Assessment?: No apparent visual deficits   Perception     Praxis      Cognition Arousal/Alertness: Awake/alert Behavior During Therapy: WFL for tasks assessed/performed Overall Cognitive Status: Within Functional Limits for tasks assessed  Exercises General Exercises - Lower Extremity Long Arc Quad: AROM;Right;10 reps;Seated Toe Raises: AROM;Both;10 reps;Seated(toe/heel raises)   Shoulder Instructions       General Comments      Pertinent Vitals/ Pain       Pain Assessment: Faces Pain Score: 7  Faces Pain Scale: Hurts even more Pain Location: R hip Pain Descriptors / Indicators: Grimacing;Burning;Guarding Pain Intervention(s): Limited activity within patient's tolerance;Monitored during  session;Repositioned;Patient requesting pain meds-RN notified  Home Living                                          Prior Functioning/Environment              Frequency  Min 2X/week        Progress Toward Goals  OT Goals(current goals can now be found in the care plan section)  Progress towards OT goals: Progressing toward goals  Acute Rehab OT Goals Patient Stated Goal: Home today OT Goal Formulation: With patient Time For Goal Achievement: 09/06/17 Potential to Achieve Goals: Good  Plan Discharge plan remains appropriate;Frequency remains appropriate    Co-evaluation                 AM-PAC PT "6 Clicks" Daily Activity     Outcome Measure   Help from another person eating meals?: None Help from another person taking care of personal grooming?: A Little Help from another person toileting, which includes using toliet, bedpan, or urinal?: A Little Help from another person bathing (including washing, rinsing, drying)?: A Little Help from another person to put on and taking off regular upper body clothing?: None Help from another person to put on and taking off regular lower body clothing?: A Little 6 Click Score: 20    End of Session Equipment Utilized During Treatment: Rolling walker  OT Visit Diagnosis: Other abnormalities of gait and mobility (R26.89);Pain Pain - Right/Left: Right Pain - part of body: Hip   Activity Tolerance Patient tolerated treatment well   Patient Left in bed;with call bell/phone within reach;with nursing/sitter in room;with SCD's reapplied(nursing in room)   Nurse Communication Mobility status;Patient requests pain meds        Time: 1152-1205 OT Time Calculation (min): 13 min  Charges: OT General Charges $OT Visit: 1 Visit OT Treatments $Self Care/Home Management : 8-22 mins   S , OTR/L  Pager [phone]     S  08/24/2017, 12:14 PM    

## 2017-08-24 NOTE — Progress Notes (Signed)
PHARMACY CONSULT NOTE FOR:  OUTPATIENT  PARENTERAL ANTIBIOTIC THERAPY (OPAT)  Indication: PJI Regimen: vanc 1gm IV Q12H End date: 10/02/17  IV antibiotic discharge orders are pended. To discharging provider:  please sign these orders via discharge navigator,  Select New Orders & click on the button choice - Manage This Unsigned Work.     Thank you for allowing pharmacy to be a part of this patient's care.  Ziquan Fidel D. Mina Marble, PharmD, BCPS, BCCCP Pager:  [phone] 08/24/2017, 12:44 PM

## 2017-08-24 NOTE — Plan of Care (Signed)
  Problem: Education: Goal: Knowledge of General Education information will improve Outcome: Progressing   

## 2017-08-24 NOTE — Progress Notes (Signed)
Physical Therapy Treatment Patient Details Name: Wendy [name] MRN: [id number] DOB: Apr 15, 1950 Today's Date: 08/24/2017    History of Present Illness Pt is a 67 y.o. 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    PT Comments    Pt progressing well with mobility, pain somewhat improved. Amb 150' with RW and supervision for safety. Planning to receive unit of blood this morning. From a mobility perspective, expect pt will be ready to d/c home after stair training during this afternoon's PT session.    Follow Up Recommendations  Follow surgeon's recommendation for DC plan and follow-up therapies;Supervision for mobility/OOB     Equipment Recommendations  None recommended by PT    Recommendations for Other Services       Precautions / Restrictions Precautions Precautions: Fall Restrictions Weight Bearing Restrictions: Yes RLE Weight Bearing: Weight bearing as tolerated    Mobility  Bed Mobility               General bed mobility comments: Received sitting in recliner  Transfers Overall transfer level: Needs assistance Equipment used: Rolling walker (2 wheeled) Transfers: Sit to/from Stand Sit to Stand: Supervision            Ambulation/Gait Ambulation/Gait assistance: Supervision Gait Distance (Feet): 150 Feet Assistive device: Rolling walker (2 wheeled) Gait Pattern/deviations: Step-through pattern;Decreased stride length;Decreased weight shift to right;Antalgic Gait velocity: Decreased Gait velocity interpretation: 1.31 - 2.62 ft/sec, indicative of limited community ambulator General Gait Details: Slow, antalgic amb with RW and supervision for safety; cues for increased WBAT through RLE   Stairs             Wheelchair Mobility    Modified Rankin (Stroke Patients Only)       Balance Overall balance assessment: Needs assistance Sitting-balance support: No upper extremity supported;Feet  supported Sitting balance-Leahy Scale: Fair     Standing balance support: Single extremity supported;Bilateral upper extremity supported;During functional activity Standing balance-Leahy Scale: Poor Standing balance comment: Relies on at least single UE support                             Cognition Arousal/Alertness: Awake/alert Behavior During Therapy: WFL for tasks assessed/performed Overall Cognitive Status: Within Functional Limits for tasks assessed                                        Exercises General Exercises - Lower Extremity Long Arc Quad: AROM;Right;10 reps;Seated Toe Raises: AROM;Both;10 reps;Seated(toe/heel raises)    General Comments        Pertinent Vitals/Pain Pain Assessment: Faces Faces Pain Scale: Hurts even more Pain Location: R hip Pain Descriptors / Indicators: Grimacing;Burning;Guarding Pain Intervention(s): Limited activity within patient's tolerance;Monitored during session    Home Living                      Prior Function            PT Goals (current goals can now be found in the care plan section) Acute Rehab PT Goals Patient Stated Goal: Home today PT Goal Formulation: With patient Time For Goal Achievement: 09/06/17 Potential to Achieve Goals: Good Progress towards PT goals: Progressing toward goals    Frequency    7X/week      PT Plan Current plan remains appropriate  Co-evaluation              AM-PAC PT "6 Clicks" Daily Activity  Outcome Measure  Difficulty turning over in bed (including adjusting bedclothes, sheets and blankets)?: None Difficulty moving from lying on back to sitting on the side of the bed? : A Little Difficulty sitting down on and standing up from a chair with arms (e.g., wheelchair, bedside commode, etc,.)?: A Little Help needed moving to and from a bed to chair (including a wheelchair)?: A Little Help needed walking in hospital room?: A Little Help  needed climbing 3-5 steps with a railing? : A Little 6 Click Score: 19    End of Session Equipment Utilized During Treatment: Gait belt Activity Tolerance: Patient tolerated treatment well Patient left: in chair;with call bell/phone within reach Nurse Communication: Mobility status PT Visit Diagnosis: Other abnormalities of gait and mobility (R26.89);Pain Pain - Right/Left: Right Pain - part of body: Hip     Time: 1655-3748 PT Time Calculation (min) (ACUTE ONLY): 23 min  Charges:  $Gait Training: 8-22 mins $Therapeutic Activity: 8-22 mins                    G Codes:      Mabeline Caras, PT, DPT Acute Rehab Services  Pager: [place] 08/24/2017, 9:29 AM

## 2017-08-24 NOTE — Discharge Instructions (Signed)
Take antibiotisc as instructed. You may bear weight as tolerated. Keep your dressing on and dry until follow up. Take medicine to prevent blood clots as directed. Take pain medicine as needed with the goal of transitioning to over the counter medicines.  If needed, you may increase pain medication for the first few days post up to 2 tablets every 4 hours.  INSTRUCTIONS AFTER JOINT REPLACEMENT   o Remove items at home which could result in a fall. This includes throw rugs or furniture in walking pathways o ICE to the affected joint every three hours while awake for 30 minutes at a time, for at least the first 3-5 days, and then as needed for pain and swelling.  Continue to use ice for pain and swelling. You may notice swelling that will progress down to the foot and ankle.  This is normal after surgery.  Elevate your leg when you are not up walking on it.   o Continue to use the breathing machine you got in the hospital (incentive spirometer) which will help keep your temperature down.  It is common for your temperature to cycle up and down following surgery, especially at night when you are not up moving around and exerting yourself.  The breathing machine keeps your lungs expanded and your temperature down.   DIET:  As you were doing prior to hospitalization, we recommend a well-balanced diet.  DRESSING / WOUND CARE / SHOWERING  You may shower 3 days after surgery, but keep the wounds dry during showering.  You may use an occlusive plastic wrap (Press'n Seal for example) with blue painter's tape at edges, NO SOAKING/SUBMERGING IN THE BATHTUB.  If the bandage gets wet, change with a clean dry gauze.  If the incision gets wet, pat the wound dry with a clean towel.  ACTIVITY  o Increase activity slowly as tolerated, but follow the weight bearing instructions below.   o No driving for 6 weeks or until further direction given by your physician.  You cannot drive while taking narcotics.  o No  lifting or carrying greater than 10 lbs. until further directed by your surgeon. o Avoid periods of inactivity such as sitting longer than an hour when not asleep. This helps prevent blood clots.  o You may return to work once you are authorized by your doctor.     WEIGHT BEARING   Weight bearing as tolerated with assist device (walker, cane, etc) as directed, use it as long as suggested by your surgeon or therapist, typically at least 4-6 weeks.   EXERCISES  Results after joint replacement surgery are often greatly improved when you follow the exercise, range of motion and muscle strengthening exercises prescribed by your doctor. Safety measures are also important to protect the joint from further injury. Any time any of these exercises cause you to have increased pain or swelling, decrease what you are doing until you are comfortable again and then slowly increase them. If you have problems or questions, call your caregiver or physical therapist for advice.   Rehabilitation is important following a joint replacement. After just a few days of immobilization, the muscles of the leg can become weakened and shrink (atrophy).  These exercises are designed to build up the tone and strength of the thigh and leg muscles and to improve motion. Often times heat used for twenty to thirty minutes before working out will loosen up your tissues and help with improving the range of motion but do not use heat  for the first two weeks following surgery (sometimes heat can increase post-operative swelling).   These exercises can be done on a training (exercise) mat, on the floor, on a table or on a bed. Use whatever works the best and is most comfortable for you.    Use music or television while you are exercising so that the exercises are a pleasant break in your day. This will make your life better with the exercises acting as a break in your routine that you can look forward to.   Perform all exercises about  fifteen times, three times per day or as directed.  You should exercise both the operative leg and the other leg as well.  Exercises include:    Quad Sets - Tighten up the muscle on the front of the thigh (Quad) and hold for 5-10 seconds.    Straight Leg Raises - With your knee straight (if you were given a brace, keep it on), lift the leg to 60 degrees, hold for 3 seconds, and slowly lower the leg.  Perform this exercise against resistance later as your leg gets stronger.   Leg Slides: Lying on your back, slowly slide your foot toward your buttocks, bending your knee up off the floor (only go as far as is comfortable). Then slowly slide your foot back down until your leg is flat on the floor again.   Angel Wings: Lying on your back spread your legs to the side as far apart as you can without causing discomfort.   Hamstring Strength:  Lying on your back, push your heel against the floor with your leg straight by tightening up the muscles of your buttocks.  Repeat, but this time bend your knee to a comfortable angle, and push your heel against the floor.  You may put a pillow under the heel to make it more comfortable if necessary.   A rehabilitation program following joint replacement surgery can speed recovery and prevent re-injury in the future due to weakened muscles. Contact your doctor or a physical therapist for more information on knee rehabilitation.    CONSTIPATION  Constipation is defined medically as fewer than three stools per week and severe constipation as less than one stool per week.  Even if you have a regular bowel pattern at home, your normal regimen is likely to be disrupted due to multiple reasons following surgery.  Combination of anesthesia, postoperative narcotics, change in appetite and fluid intake all can affect your bowels.   YOU MUST use at least one of the following options; they are listed in order of increasing strength to get the job done.  They are all  available over the counter, and you may need to use some, POSSIBLY even all of these options:    Drink plenty of fluids (prune juice may be helpful) and high fiber foods Colace 100 mg by mouth twice a day  Senokot for constipation as directed and as needed Dulcolax (bisacodyl), take with full glass of water  Miralax (polyethylene glycol) once or twice a day as needed.  If you have tried all these things and are unable to have a bowel movement in the first 3-4 days after surgery call either your surgeon or your primary doctor.    If you experience loose stools or diarrhea, hold the medications until you stool forms back up.  If your symptoms do not get better within 1 week or if they get worse, check with your doctor.  If you experience "the worst  abdominal pain ever" or develop nausea or vomiting, please contact the office immediately for further recommendations for treatment.   ITCHING:  If you experience itching with your medications, try taking only a single pain pill, or even half a pain pill at a time.  You can also use Benadryl over the counter for itching or also to help with sleep.   TED HOSE STOCKINGS:  Use stockings on both legs until for at least 2 weeks or as directed by physician office. They may be removed at night for sleeping.  MEDICATIONS:  See your medication summary on the After Visit Summary that nursing will review with you.  You may have some home medications which will be placed on hold until you complete the course of blood thinner medication.  It is important for you to complete the blood thinner medication as prescribed.  PRECAUTIONS:  If you experience chest pain or shortness of breath - call 911 immediately for transfer to the hospital emergency department.   If you develop a fever greater that 101 F, purulent drainage from wound, increased redness or drainage from wound, foul odor from the wound/dressing, or calf pain - CONTACT YOUR SURGEON.                                                    FOLLOW-UP APPOINTMENTS:  If you do not already have a post-op appointment, please call the office for an appointment to be seen by your surgeon.  Guidelines for how soon to be seen are listed in your After Visit Summary, but are typically between 1-4 weeks after surgery.  OTHER INSTRUCTIONS:     MAKE SURE YOU:   Understand these instructions.   Get help right away if you are not doing well or get worse.    Thank you for letting us be a part of your medical care team.  It is a privilege we respect greatly.  We hope these instructions will help you stay on track for a fast and full recovery!    Information on my medicine - Coumadin   (Warfarin)  This medication education was reviewed with me or my healthcare representative as part of my discharge preparation.    Why was Coumadin prescribed for you? Coumadin was prescribed for you because you have a blood clot or a medical condition that can cause an increased risk of forming blood clots. Blood clots can cause serious health problems by blocking the flow of blood to the heart, lung, or brain. Coumadin can prevent harmful blood clots from forming. As a reminder your indication for Coumadin is:   Stroke Prevention Because Of Atrial Fibrillation  What test will check on my response to Coumadin? While on Coumadin (warfarin) you will need to have an INR test regularly to ensure that your dose is keeping you in the desired range. The INR (international normalized ratio) number is calculated from the result of the laboratory test called prothrombin time (PT).  If an INR APPOINTMENT HAS NOT ALREADY BEEN MADE FOR YOU please schedule an appointment to have this lab work done by your health care provider within 7 days. Your INR goal is usually a number between:  2 to 3 or your provider may give you a more narrow range like 2-2.5.  Ask your health care provider during an office visit what your  goal INR is.  What  do you  need to  know  About  COUMADIN? Take Coumadin (warfarin) exactly as prescribed by your healthcare provider about the same time each day.  DO NOT stop taking without talking to the doctor who prescribed the medication.  Stopping without other blood clot prevention medication to take the place of Coumadin may increase your risk of developing a new clot or stroke.  Get refills before you run out.  What do you do if you miss a dose? If you miss a dose, take it as soon as you remember on the same day then continue your regularly scheduled regimen the next day.  Do not take two doses of Coumadin at the same time.  Important Safety Information A possible side effect of Coumadin (Warfarin) is an increased risk of bleeding. You should call your healthcare provider right away if you experience any of the following: ? Bleeding from an injury or your nose that does not stop. ? Unusual colored urine (red or dark brown) or unusual colored stools (red or black). ? Unusual bruising for unknown reasons. ? A serious fall or if you hit your head (even if there is no bleeding).  Some foods or medicines interact with Coumadin (warfarin) and might alter your response to warfarin. To help avoid this: ? Eat a balanced diet, maintaining a consistent amount of Vitamin K. ? Notify your provider about major diet changes you plan to make. ? Avoid alcohol or limit your intake to 1 drink for women and 2 drinks for men per day. (1 drink is 5 oz. wine, 12 oz. beer, or 1.5 oz. liquor.)  Make sure that ANY health care provider who prescribes medication for you knows that you are taking Coumadin (warfarin).  Also make sure the healthcare provider who is monitoring your Coumadin knows when you have started a new medication including herbals and non-prescription products.  Coumadin (Warfarin)  Major Drug Interactions  Increased Warfarin Effect Decreased Warfarin Effect  Alcohol (large quantities) Antibiotics (esp.  Septra/Bactrim, Flagyl, Cipro) Amiodarone (Cordarone) Aspirin (ASA) Cimetidine (Tagamet) Megestrol (Megace) NSAIDs (ibuprofen, naproxen, etc.) Piroxicam (Feldene) Propafenone (Rythmol SR) Propranolol (Inderal) Isoniazid (INH) Posaconazole (Noxafil) Barbiturates (Phenobarbital) Carbamazepine (Tegretol) Chlordiazepoxide (Librium) Cholestyramine (Questran) Griseofulvin Oral Contraceptives Rifampin Sucralfate (Carafate) Vitamin K   Coumadin (Warfarin) Major Herbal Interactions  Increased Warfarin Effect Decreased Warfarin Effect  Garlic Ginseng Ginkgo biloba Coenzyme Q10 Green tea St. Johns wort    Coumadin (Warfarin) FOOD Interactions  Eat a consistent number of servings per week of foods HIGH in Vitamin K (1 serving =  cup)  Collards (cooked, or boiled & drained) Kale (cooked, or boiled & drained) Mustard greens (cooked, or boiled & drained) Parsley *serving size only =  cup Spinach (cooked, or boiled & drained) Swiss chard (cooked, or boiled & drained) Turnip greens (cooked, or boiled & drained)  Eat a consistent number of servings per week of foods MEDIUM-HIGH in Vitamin K (1 serving = 1 cup)  Asparagus (cooked, or boiled & drained) Broccoli (cooked, boiled & drained, or raw & chopped) Brussel sprouts (cooked, or boiled & drained) *serving size only =  cup Lettuce, raw (green leaf, endive, romaine) Spinach, raw Turnip greens, raw & chopped   These websites have more information on Coumadin (warfarin):  FailFactory.se; VeganReport.com.au;

## 2017-08-24 NOTE — Progress Notes (Signed)
Physical Therapy Treatment & Discharge Patient Details Name: Wendy [name] MRN: [id number] DOB: 07/17/1950 Today's Date: 08/24/2017    History of Present Illness Pt is a 66 y.o. 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    PT Comments    Pt progressing well with mobility. Ambulating with RW at supervision-level. Pt declined stair training, reports correct technique to ascend/descend steps (recalls from previous THA). Has met short-term acute PT goals. Has no further questions or concerns; motivated to return home this evening and will have appropriate assist from family. Will d/c acute PT.   Follow Up Recommendations  Follow surgeon's recommendation for DC plan and follow-up therapies;Supervision for mobility/OOB     Equipment Recommendations  None recommended by PT    Recommendations for Other Services       Precautions / Restrictions Precautions Precautions: Fall Restrictions Weight Bearing Restrictions: Yes RLE Weight Bearing: Weight bearing as tolerated    Mobility  Bed Mobility Overal bed mobility: Modified Independent Bed Mobility: Sit to Supine;Supine to Sit     Supine to sit: HOB elevated;Min assist Sit to supine: Supervision   General bed mobility comments: Increased time and effort; pt reliant on LLE to assist RLE to EOB  Transfers Overall transfer level: Needs assistance Equipment used: Rolling walker (2 wheeled) Transfers: Sit to/from Stand Sit to Stand: Modified independent (Device/Increase time)         General transfer comment: stand pivot to BSC no RW, cueing for safety; min guard amulation to/from restroom to wash hands   Ambulation/Gait Ambulation/Gait assistance: Supervision Gait Distance (Feet): 30 Feet Assistive device: Rolling walker (2 wheeled) Gait Pattern/deviations: Step-through pattern;Decreased stride length;Decreased weight shift to right;Antalgic Gait velocity: Decreased Gait  velocity interpretation: 1.31 - 2.62 ft/sec, indicative of limited community ambulator General Gait Details: Pt only agreeable to amb from bed<>bathroom due to needing to use restroom   Stairs Stairs: (Declined stair training; pt able to recall correct technique from previous hip sx)           Wheelchair Mobility    Modified Rankin (Stroke Patients Only)       Balance Overall balance assessment: Needs assistance Sitting-balance support: No upper extremity supported;Feet supported Sitting balance-Leahy Scale: Fair     Standing balance support: During functional activity;No upper extremity supported Standing balance-Leahy Scale: Fair Standing balance comment: washing hands with 0 hand support, min guard for safety                             Cognition Arousal/Alertness: Awake/alert Behavior During Therapy: WFL for tasks assessed/performed Overall Cognitive Status: Within Functional Limits for tasks assessed                                        Exercises Total Joint Exercises Ankle Circles/Pumps: AROM;Both;10 reps;Supine Quad Sets: AROM;Both;10 reps;Supine Heel Slides: AAROM;Right;10 reps;Supine(use of blanket to assist R knee flexion)    General Comments        Pertinent Vitals/Pain Pain Assessment: Faces Pain Score: 7  Faces Pain Scale: Hurts little more Pain Location: R hip Pain Descriptors / Indicators: Grimacing;Burning;Guarding Pain Intervention(s): Monitored during session;Limited activity within patient's tolerance    Home Living                        Prior Function            PT Goals (current goals can now be found in the care plan section) Acute Rehab PT Goals Patient Stated Goal: Home today PT Goal Formulation: With patient Time For Goal Achievement: 09/06/17 Potential to Achieve Goals: Good Progress towards PT goals: Progressing toward goals    Frequency    7X/week      PT Plan Current plan  remains appropriate    Co-evaluation              AM-PAC PT "6 Clicks" Daily Activity  Outcome Measure  Difficulty turning over in bed (including adjusting bedclothes, sheets and blankets)?: None Difficulty moving from lying on back to sitting on the side of the bed? : None Difficulty sitting down on and standing up from a chair with arms (e.g., wheelchair, bedside commode, etc,.)?: None Help needed moving to and from a bed to chair (including a wheelchair)?: None Help needed walking in hospital room?: A Little Help needed climbing 3-5 steps with a railing? : A Little 6 Click Score: 22    End of Session   Activity Tolerance: Patient tolerated treatment well Patient left: in bed;with call bell/phone within reach Nurse Communication: Mobility status PT Visit Diagnosis: Other abnormalities of gait and mobility (R26.89);Pain Pain - Right/Left: Right Pain - part of body: Hip     Time: 1437-1452 PT Time Calculation (min) (ACUTE ONLY): 15 min  Charges:  $Therapeutic Exercise: 8-22 mins                    G Codes:       , PT, DPT Acute Rehab Services  Pager: [phone]   L  08/24/2017, 3:09 PM   

## 2017-08-24 NOTE — Progress Notes (Signed)
   Contacted by Infectious Disease secondary to the need for Rifampin IV therapy for 6 weeks duration. Pt is currently on Eliquis and Ranexa, both of which interact with Rifampin. I personally contacted [facility] for assistance. Coumadin will be her only alternative for the time being. I have placed an inpatient pharmacy consult to start Coumadin. She is to be discharged today, however it may be more beneficial to keep her one more night given the start of Coumadin. She will need to have her INR checked by Monday 08/28/17 and called directly to our pharmacist office at [phone] if this will be checked by Home Health RN. If she does not have these services in place, she will need to have this checked at our office by Monday 08/28/17.   Addendum: Spoke with Case Manager, Ricki Miller who will take care of entering the order for INR check by [facility] RN on 08/28/17 with results called to [phone].   Thank you  Kathyrn Drown NP-[facility] Pager: [phone]

## 2017-08-24 NOTE — Consult Note (Addendum)
Cardiology Consultation:   Patient ID: Wendy [name]; [id number]; 1950/12/01   Admit date: 08/22/2017 Date of Consult: 08/24/2017  Primary Care Provider: Josetta Huddle, MD Primary Cardiologist: [name], MD  Primary Electrophysiologist:  NA   Patient Profile:   Wendy [name] is a 67 y.o. female with a hx of CAD with CABG and cath 07/2016 with atretic LIMA to LAD, occluded VG to OM, occluded VG to PDA- severe CAD, PAF has been maintaining SR/SB on eliquis, HTN, HLD and COPD who is being seen today for the evaluation of meds interacting with antibiotic rifampin including eliquis and ranexa that she is taking for infection of Rt prosthetic hip joint at the request of Dr. [name] and ID, Janene Madeira, NP .  History of Present Illness:   Ms. Leaman has a hx of CAD with CABG and cath 07/2016 with atretic LIMA to LAD, occluded VG to OM, occluded VG to PDA- severe CAD, PAF has been maintaining SR/SB on eliquis, HTN, HLD and COPD.  Last saw Dr. [name]  07/03/17 for clearance for a back stimulator and he felt she was at moderate risk for the procedure.    Last cath 01/22/15 with all 3 grafts with stenosis or atretic.  She underwent PCI to VG to RCA with angiosculpt scoring balloon then flextaone cutting balloon, and focal stenting at the prox site with new DES.   Continue DAPT indefinitely.   By 07/2016 the VG to RCA was totally occluded prox.  Pt has been on plavix, imdur 60, lopressor 50 BID,  Ranexa 1000 mg BID,  lipitor 80 and zetia and eliquis for PAF.    Now admitted 08/17/17 with recent Rt hip arthroplasty 07/2016 and now with Rt hip pain and redness in her incision. She was admitted and had total hip revision on 08/22/17.   She will need prolonged IV ABXs 6 weeks, and then conversion to oral therapy for at least 6 weeks.   Her Eliquis has been held for surgery.  She has been receiving ranexa.   Currently she tells me she has no chest pain and her SOB is stable.  She had been doing well  prior to admit.  She also does not want to be on coumadin.   Hgb 8.4, WBC 6.2, Na 139, K+ 3.8, Cr 0.71,    Past Medical History:  Diagnosis Date  . Anemia   . Anginal pain ([place])    jaw pain 07/2016, s/p 07/15/16 nuclear stress test  . Anxiety   . Arthritis   . CAD (coronary artery disease), native coronary artery    a. Hx CABG 1998, last PCI 2013 b.LHC 07/2013:  EF 55-60%, L-LAD prob atretic, no sig disease in LAD, S-OM1 ok with patent stent, S-dRCA ok => med Rx. c. LHC 01/2015 DES to SVG-RCA.  Marland Kitchen Chronic back pain   . Chronic leg pain   . COPD (chronic obstructive pulmonary disease) ([place])   . Fall 08/2016  . GERD (gastroesophageal reflux disease)   . Glucose intolerance (impaired glucose tolerance)   . HLD (hyperlipidemia)   . Hypertension   . Myocardial infarction ([place])   . PAD (peripheral artery disease) ([place])   . PAF (paroxysmal atrial fibrillation) ([place])    a. 09/2013 post-op b. 01/2015  . Peripheral neuropathy   . Shortness of breath    when walking  . Sleep apnea    uses a cpap  . Wears glasses     Past Surgical History:  Procedure Laterality Date  .  CARDIAC CATHETERIZATION N/A 01/22/2015   Procedure: Left Heart Cath and Coronary Angiography;  Surgeon: Troy Sine, MD;  Location: [facility];  Service: Cardiovascular;  Laterality: N/A;  . CARDIAC CATHETERIZATION N/A 01/22/2015   Procedure: Coronary Stent Intervention;  Surgeon: Troy Sine, MD;  Location: [facility];  Service: Cardiovascular;  Laterality: N/A;  3.0x12 xience prox SVG to RCA  . CARDIAC SURGERY  1998   LIMA-LAD, SVG-OM, SVG-RCA  . [place] and again several years later  . CHOLECYSTECTOMY  09/17/2013   dr [name]  . CHOLECYSTECTOMY N/A 09/16/2013   Procedure: LAPAROSCOPIC CHOLECYSTECTOMY CONVERTED TO OPEN CHOLECYSTECTOMY WITH CHOLANGIOGRAM;  Surgeon: [name], MD;  Location: [facility];  Service: General;  Laterality: N/A;  . CORONARY ANGIOPLASTY WITH STENT  PLACEMENT  11/2011, 02/2012, 01/2015   DES to SVG-RCA both times, In [place], [place], Dr. Bruce Donath  . ERCP N/A 09/19/2013   Procedure: ENDOSCOPIC RETROGRADE CHOLANGIOPANCREATOGRAPHY (ERCP);  Surgeon: [name], MD;  Location: [facility];  Service: Endoscopy;  Laterality: N/A;  . FINGER SURGERY Right    ring finger  . i and d right subcostal wound  10/17/13  . IRRIGATION AND DEBRIDEMENT ABSCESS Right 10/17/2013   Procedure: INCISION AND DRAINAGE RIGHT SUBCOSTAL WOUND;  Surgeon: [name], MD;  Location: [facility];  Service: General;  Laterality: Right;  . KNEE ARTHROSCOPY Right 1998  . LEFT HEART CATH AND CORS/GRAFTS ANGIOGRAPHY N/A 07/25/2016   Procedure: Left Heart Cath and Cors/Grafts Angiography;  Surgeon: Jettie Booze, MD;  Location: [facility];  Service: Cardiovascular;  Laterality: N/A;  . LEFT HEART CATHETERIZATION WITH CORONARY/GRAFT ANGIOGRAM N/A 07/19/2013   Procedure: LEFT HEART CATHETERIZATION WITH Beatrix Fetters;  Surgeon: Leonie Man, MD;  Location: [facility];  Service: Cardiovascular;  Laterality: N/A;  . MASS EXCISION Right 06/25/2014   Procedure: EXCISION BUTTOCK MASS ;  Surgeon: Coralie Keens, MD;  Location: [facility];  Service: General;  Laterality: Right;  . ORIF HUMERUS FRACTURE Left 08/27/2013   Procedure: OPEN REDUCTION INTERNAL FIXATION (ORIF) LEFT HUMERUS ;  Surgeon: Rozanna Box, MD;  Location: [facility];  Service: Orthopedics;  Laterality: Left;  . TOTAL HIP ARTHROPLASTY Right 07/19/2016   Procedure: TOTAL HIP ARTHROPLASTY ANTERIOR APPROACH;  Surgeon: [name], MD;  Location: [facility];  Service: Orthopedics;  Laterality: Right;  . TOTAL HIP REVISION Right 08/22/2017   Procedure: TOTAL HIP REVISION;  Surgeon: [name], MD;  Location: [facility];  Service: Orthopedics;  Laterality: Right;     Home Medications:  Prior to Admission medications   Medication Sig Start Date End Date Taking? Authorizing Provider    acetaminophen (TYLENOL) 500 MG tablet Take 1,000 mg by mouth every 6 (six) hours as needed for pain.  11/16/16  Yes [provider]  albuterol (PROVENTIL HFA;VENTOLIN HFA) 108 (90 BASE) MCG/ACT inhaler Inhale 2 puffs into the lungs every 6 (six) hours as needed for wheezing or shortness of breath.   Yes [provider]  ALPRAZolam Duanne Moron) 0.5 MG tablet Take 0.5 mg by mouth 3 (three) times daily as needed for anxiety.   Yes [provider]  apixaban (ELIQUIS) 5 MG TABS tablet Take 1 tablet (5 mg total) by mouth 2 (two) times daily. 08/04/16  Yes Nahser, Wonda Cheng, MD  atorvastatin (LIPITOR) 80 MG tablet Take 1 tablet (80 mg total) by mouth daily at 6 PM. 07/03/17  Yes Nahser, Wonda Cheng, MD  baclofen (LIORESAL) 20 MG tablet Take 20 mg by mouth 3 (three) times daily as needed for muscle spasms.   Yes [provider]  buPROPion (WELLBUTRIN XL) 150 MG 24 hr tablet Take 150 mg by mouth daily.   Yes [provider]  cetirizine (ZYRTEC) 10 MG tablet Take 10 mg by mouth daily as needed for allergies.    Yes [provider]  cholecalciferol (VITAMIN D) 1000 units tablet Take 2,000 Units by mouth daily.   Yes [provider]  clopidogrel (PLAVIX) 75 MG tablet Take 75 mg by mouth daily.   Yes [provider]  diclofenac sodium (VOLTAREN) 1 % GEL Apply 1 g topically daily as needed (pain).   Yes [provider]  doxycycline (VIBRAMYCIN) 100 MG capsule Take 100 mg by mouth 2 (two) times daily. 08/15/17 08/25/17 Yes [provider]  ezetimibe (ZETIA) 10 MG tablet Take 10 mg by mouth daily.   Yes [provider]  furosemide (LASIX) 20 MG tablet Take 1 tablet (20 mg total) daily by mouth. Patient taking differently: Take 20 mg by mouth daily as needed for fluid.  01/16/17 01/16/18 Yes Nahser, Wonda Cheng, MD  isosorbide mononitrate (IMDUR) 60 MG 24 hr tablet Take 1 tablet (60 mg total) by mouth every morning. 07/03/17 07/03/18 Yes  Nahser, Wonda Cheng, MD  metoprolol tartrate (LOPRESSOR) 50 MG tablet Take 1 tablet (50 mg total) by mouth 2 (two) times daily. 07/03/17  Yes Nahser, Wonda Cheng, MD  nitroGLYCERIN (NITROSTAT) 0.4 MG SL tablet Place 1 tablet (0.4 mg total) under the tongue every 5 (five) minutes as needed for chest pain. 05/29/17  Yes Nahser, Wonda Cheng, MD  oxyCODONE (OXY IR/ROXICODONE) 5 MG immediate release tablet Take 5 mg by mouth 2 (two) times daily as needed. 08/14/17  Yes [provider]  pantoprazole (PROTONIX) 40 MG tablet Take 1 tablet (40 mg total) daily by mouth. 01/16/17  Yes Nahser, Wonda Cheng, MD  pramipexole (MIRAPEX) 0.25 MG tablet Take 0.25 mg by mouth at bedtime as needed (for restless leg).    Yes [provider]  ranolazine (RANEXA) 1000 MG SR tablet Take 1,000 mg by mouth 2 (two) times daily.   Yes [provider]  rifampin (RIFADIN) 300 MG capsule Take 300 mg by mouth 2 (two) times daily. 08/16/17 08/26/17 Yes [provider]  sertraline (ZOLOFT) 100 MG tablet Take 150 mg by mouth daily.    Yes [provider]  acetaminophen (TYLENOL) 500 MG tablet Take 2 tablets (1,000 mg total) by mouth every 8 (eight) hours for 14 days. For Pain. 08/22/17 09/05/17  Prudencio Burly III, PA-C  aspirin EC 81 MG tablet Take 1 tablet (81 mg total) by mouth 2 (two) times daily. For DVT prophylaxis 08/24/17 10/05/17  Prudencio Burly III, PA-C  docusate sodium (COLACE) 100 MG capsule Take 1 capsule (100 mg total) by mouth 2 (two) times daily. To prevent constipation while taking pain medication. 08/22/17   Martensen, Charna Elizabeth III, PA-C  oxyCODONE (ROXICODONE) 5 MG immediate release tablet Take 1 tablet (5 mg total) by mouth every 4 (four) hours as needed for breakthrough pain. 08/22/17 09/21/17  Prudencio Burly III, PA-C  oxyCODONE-acetaminophen (ROXICET) 5-325 MG tablet Take 1 tablet by mouth every 6 (six) hours as needed for severe pain. Patient not taking: Reported  on 08/22/2017 08/16/16   Modena Jansky, MD  rifampin (RIFADIN) 300 MG capsule Take 1 capsule (300 mg total) by mouth 2 (two) times  daily. 08/22/17 10/03/17  Prudencio Burly III, PA-C  vancomycin IVPB Inject 1,000 mg into the vein every 12 (twelve) hours. Indication: right hip infection Last Day of Therapy:  10/04/17 Labs - Sunday/Monday:  CBC/D, BMP, and vancomycin trough. Labs - Thursday:  BMP and vancomycin trough Labs - Every other week:  ESR and CRP 08/24/17   Prudencio Burly III, PA-C    Inpatient Medications: Scheduled Meds: . acetaminophen  1,000 mg Oral Q8H  . aspirin EC  81 mg Oral BID  . atorvastatin  80 mg Oral q1800  . buPROPion  150 mg Oral Daily  . clopidogrel  75 mg Oral Daily  . docusate sodium  100 mg Oral BID  . ezetimibe  10 mg Oral Daily  . ferrous sulfate  325 mg Oral BID WC  . gabapentin  300 mg Oral TID  . isosorbide mononitrate  60 mg Oral Daily  . metoprolol tartrate  50 mg Oral BID  . pantoprazole  40 mg Oral Daily  . ranolazine  1,000 mg Oral BID  . rifampin  300 mg Oral BID WC  . sertraline  150 mg Oral Daily   Continuous Infusions: . lactated ringers 50 mL/hr at 08/23/17 0004  . vancomycin 1,000 mg (08/24/17 1210)   PRN Meds: albuterol, ALPRAZolam, baclofen, HYDROmorphone (DILAUDID) injection, hydrOXYzine, magnesium citrate, menthol-cetylpyridinium **OR** phenol, metoCLOPramide **OR** metoCLOPramide (REGLAN) injection, nitroGLYCERIN, ondansetron **OR** ondansetron (ZOFRAN) IV, oxyCODONE, polyethylene glycol, pramipexole, sodium chloride flush, sorbitol  Allergies:    Allergies  Allergen Reactions  . Benadryl [Diphenhydramine] Shortness Of Breath  . Adhesive [Tape] Other (See Comments)    Burning of skin  . Amoxicillin Diarrhea and Other (See Comments)    Has patient had a PCN reaction causing immediate rash, facial/tongue/throat swelling, SOB or lightheadedness with hypotension: No Has patient had a PCN reaction causing severe rash  involving mucus membranes or skin necrosis: No Has patient had a PCN reaction that required hospitalization No Has patient had a PCN reaction occurring within the last 10 years: Yes -- noted rxn as diarrhea If all of the above answers are "NO", then may proceed with Cephalosporin use.   Yvette Rack [Cyclobenzaprine] Other (See Comments)    Causes restless leg syndrome  . Hydrocodone Itching    Social History:   Social History   Socioeconomic History  . Marital status: Divorced    Spouse name: Not on file  . Number of children: 5  . Years of education: Not on file  . Highest education level: Not on file  Occupational History  . Occupation: Disabled  Social Needs  . Financial resource strain: Not on file  . Food insecurity:    Worry: Not on file    Inability: Not on file  . Transportation needs:    Medical: Not on file    Non-medical: Not on file  Tobacco Use  . Smoking status: Former Smoker    Last attempt to quit: 07/20/1998    Years since quitting: 19.1  . Smokeless tobacco: Never Used  Substance and Sexual Activity  . Alcohol use: No  . Drug use: No  . Sexual activity: Not on file  Lifestyle  . Physical activity:    Days per week: Not on file    Minutes per session: Not on file  . Stress: Not on file  Relationships  . Social connections:    Talks on phone: Not on file    Gets together: Not on file    Attends religious  service: Not on file    Active member of club or organization: Not on file    Attends meetings of clubs or organizations: Not on file    Relationship status: Not on file  . Intimate partner violence:    Fear of current or ex partner: Not on file    Emotionally abused: Not on file    Physically abused: Not on file    Forced sexual activity: Not on file  Other Topics Concern  . Not on file  Social History Narrative   Lives with best friend.     Family History:    Family History  Problem Relation Age of Onset  . Lung cancer Mother   .  Clotting disorder Father   . Lung cancer Sister   . Heart disease Brother   . Heart disease Brother   . Colon cancer Neg Hx      ROS:  Please see the history of present illness.  General:no colds or fevers, no weight changes Skin:no rashes or ulcers HEENT:no blurred vision, no congestion CV:see HPI PUL:see HPI GI:no diarrhea constipation or melena, no indigestion GU:no hematuria, no dysuria MS:Rt hop pain, no claudication Neuro:no syncope, no lightheadedness Endo:no diabetes, no thyroid disease  All other ROS reviewed and negative.     Physical Exam/Data:   Vitals:   08/24/17 0503 08/24/17 0830 08/24/17 0900 08/24/17 1102  BP: (!) 153/55 (!) 130/46 (!) 106/56 95/76  Pulse: 67 69 68 61  Resp:  _0 Temp: 98 F (36.7 C) 98.2 F (36.8 C) 98.4 F (36.9 C) 98.2 F (36.8 C)  TempSrc: Oral Oral Oral Oral  SpO2: 99% 94% 95% 98%  Weight:      Height:        Intake/Output Summary (Last 24 hours) at 08/24/2017 1457 Last data filed at 08/24/2017 1102 Gross per 24 hour  Intake 764 ml  Output 350 ml  Net 414 ml   Filed Weights   08/22/17 1035  Weight: 164 lb (74.4 kg)   Body mass index is 30.99 kg/m.  General:  Well nourished, well developed, in no acute distress HEENT: normal Lymph: no adenopathy Neck: no JVD Endocrine:  No thryomegaly Vascular: No carotid bruits; pedal pulses 2+ bilaterally   Cardiac:  normal S1, S2; RRR; soft systolic murmur, no gallup or rub by exam seems to be SR Lungs:  clear to auscultation bilaterally, no wheezing, rhonchi or rales, from ant position  Abd: soft, nontender, no hepatomegaly  Ext: no edema Musculoskeletal:  No deformities, BUE and BLE strength normal and equal Skin: warm and dry  Neuro:  Alert and oriented X 3 MAE follows commands, no focal abnormalities noted Psych:  Normal affect   EKG:  The EKG was personally reviewed and demonstrates:  None Telemetry:  Telemetry was personally reviewed and demonstrates:   none  Relevant CV Studies: Procedures  01/22/15.  Coronary Stent Intervention  Left Heart Cath and Coronary Angiography  Conclusion    Prox RCA lesion, 100% stenosed.  2nd Mrg lesion, 100% stenosed.  1st Mrg-2 lesion, 100% stenosed. The lesion was previously treated with a stent (unknown type).  1st Mrg-1 lesion, 100% stenosed.  SVG was injected is large.  There is severe disease in the graft.  SVG was injected .  There is severe disease in the graft.  Origin lesion, 100% stenosed.  Dist Cx lesion, 75% stenosed.  Prox Graft lesion, 95% stenosed. Post intervention, there is a 0% residual stenosis. The lesion was  previously treated with a stent (unknown type).  Mid Graft lesion, 75% stenosed. Post intervention, 0% residual stenosis.  The left ventricular systolic function is normal.   Low normal global LV function with an ejection fraction of 50-55%.  Significant native CAD with 75% stenosis in the mid AV groove circumflex with total occlusion of the proximal marginal and distal marginal, but with filling of the distal marginal beyond a previously placed occluded stented segment, total occlusion of a calcified RCA, and LAD system without significant obstructive disease and only mild luminal irregularity.  Previously documented atretic LIMA to the LAD, not visualized today.  New or recent occlusion of the vein graft which had supplied the left circumflex system.  SVG supplying a large distal RCA system with previously placed stents in the proximal to midsegment with overlap and 95% focal in-stent stenosis in the proximal portion of the stent and 70% in-stent stenosis in the distal portion of the stent.  PCI to the SVG to RCA with initial Angiosculpt scoring balloon, subsequently Flextone cutting balloon, high pressure noncompliant balloon dilatation to the entire stented segment, and focal stenting at the proximal site with insertion of a new 3.012 mm Xience Alpine  DES stent postdilated to 3.3 mm.  The proximal 95% in-stent restenosis was reduced to less than 5% and the distal 75% in-stent restenosis was reduced to 0%.  Recommendation: The patient should be maintained on dual antiplatelet therapy indefinitely.  Her LIMA has been previously documented to be atretic, but her LAD system is without significant obstructive disease.  Medical therapy for her concomitant disease.      Diagnostic Diagram       Post-Intervention Diagram        Nuc 07/15/2016  Study Highlights    Nuclear stress EF: 48%.  EKG nondiagnostic due to baseline changes  Very small region of distal anterior ischemia, cannot exclude shifting soft tissue Inferior / inferolateral defect consistent with soft tissue attenuation and/or scar  Intermediate risk study.     Echo 07/23/16  Study Conclusions  - Left ventricle: The cavity size was normal. Wall thickness was   normal. Systolic function was normal. The estimated ejection   fraction was in the range of 60% to 65%. Wall motion was normal;   there were no regional wall motion abnormalities. The study is   not technically sufficient to allow evaluation of LV diastolic   function. - Aortic valve: Mildly calcified annulus. Trileaflet. - Mitral valve: Calcified annulus. There was trivial regurgitation. - Right atrium: Central venous pressure (est): 3 mm Hg. - Tricuspid valve: There was trivial regurgitation. - Pulmonary arteries: PA peak pressure: 24 mm Hg (S). - Pericardium, extracardiac: There was no pericardial effusion.  Impressions:  - Normal LV wall thickness with LVEF 60-65%. Indeterminate   diastolic function. Mitral annular calcification with trivial   mitral regurgitation. Trivial tricuspid regurgitation with PASP   24 mmHg.  CATH 07/25/16  VG-RCA 100% stenosed,   Diagnostic Diagram         Laboratory Data:  Chemistry Recent Labs  Lab 08/18/17 1524 08/23/17 0419  NA 141 139  K 3.9  3.8  CL 104 106  CO2 28 27  GLUCOSE 95 122*  BUN 16 13  CREATININE 0.74 0.71  CALCIUM 8.9 8.2*  GFRNONAA >60 >60  GFRAA >60 >60  ANIONGAP 9 6    No results for input(s): PROT, ALBUMIN, AST, ALT, ALKPHOS, BILITOT in the last 168 hours. Hematology Recent Labs  Lab 08/18/17 1524 08/23/17  4827 08/24/17 0430  WBC 6.2 8.2 6.2  RBC 4.06 3.47* 3.16*  HGB 10.8* 9.3* 8.4*  HCT 35.6* 30.5* 28.2*  MCV 87.7 87.9 89.2  MCH 26.6 26.8 26.6  MCHC 30.3 30.5 29.8*  RDW 15.6* 15.8* 15.7*  PLT 208 183 162   Cardiac EnzymesNo results for input(s): TROPONINI in the last 168 hours. No results for input(s): TROPIPOC in the last 168 hours.  BNPNo results for input(s): BNP, PROBNP in the last 168 hours.  DDimer No results for input(s): DDIMER in the last 168 hours.  Radiology/Studies:  Dg C-arm 1-60 Min  Result Date: 08/22/2017 CLINICAL DATA:  67 year old post right hip revision. Initial encounter. EXAM: DG C-ARM 61-120 MIN; OPERATIVE RIGHT HIP WITH PELVIS COMPARISON:  08/15/2016 plain film exam. Fluoroscopic time: 6 seconds. FINDINGS: Two intraoperative C-arm views submitted for review after surgery. Right hip prosthesis appears in satisfactory position without complication noted on this frontal projection. IMPRESSION: Post right hip replacement. Electronically Signed   By: Genia Del M.D.   On: 08/22/2017 18:33   Dg Hip Port Unilat With Pelvis 1v Right  Result Date: 08/23/2017 CLINICAL DATA:  Right hip arthroplasty EXAM: DG HIP (WITH OR WITHOUT PELVIS) 1V PORT RIGHT COMPARISON:  Intraoperative radiographs of the right hip. FINDINGS: Uncemented right total hip arthroplasty is noted with single overlying drain in place. Surgical clips project over the right and left groin. No immediate postoperative complications. Expected postop soft tissue emphysema along the lateral right thigh. IMPRESSION: Intact right uncemented total hip arthroplasty with drain in place. Electronically Signed   By: Ashley Royalty  M.D.   On: 08/23/2017 02:16   Dg Hip Operative Unilat W Or W/o Pelvis Right  Result Date: 08/22/2017 CLINICAL DATA:  67 year old post right hip revision. Initial encounter. EXAM: DG C-ARM 61-120 MIN; OPERATIVE RIGHT HIP WITH PELVIS COMPARISON:  08/15/2016 plain film exam. Fluoroscopic time: 6 seconds. FINDINGS: Two intraoperative C-arm views submitted for review after surgery. Right hip prosthesis appears in satisfactory position without complication noted on this frontal projection. IMPRESSION: Post right hip replacement. Electronically Signed   By: Genia Del M.D.   On: 08/22/2017 18:33   [place] Ekg Site Rite  Result Date: 08/22/2017 If Site Rite image not attached, placement could not be confirmed due to current cardiac rhythm.   Assessment and Plan:   1. 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  Post op of revision and now on IV ABX with rifampin and vanc.    2.         Severe CAD with graft failure to her bypass grafts.  She is on imdur and Ranexa but with ABX ranexa is contraindicated but has rec'd here in the hospital.  plavix continues and currently on ASA81, not on anticoag.  On imdur and BB.  Concern for recurrent angina off Ranexa.  Dr. Sallyanne Kuster to see.  Is this the only ABX for her infection other than vanc?   Will continue Ranexa better to have some on board.    3.          PAF-  eliquis is not indicated with rifampin. Pharmacy suggests coumadin, ? Crossover with lovenox Dr, Makailee Nudelman to see.  Pt is agreeing to coumadin for 12 weeks.  Would need to keep overnight and stop ASA with recent anemia.   4.          HTN  BP today 153/55 to 106/56 to 95/76 -would recheck BP   5.  HLD  On lipito 80 and zetia 10. Continue   6.           Anemia with drift down of Hgb from 10.8 to 8.4 today pt rec'd a unit of PRBCs today.       For questions or updates, please contact [facility] Please consult [email or web address] for contact info under Cardiology/STEMI.   Signed, Cecilie Kicks,  NP  08/24/2017 2:57 PM  I have seen and examined the patient along with Cecilie Kicks, NP .  I have reviewed the chart, notes and new data.  I agree with PA/NP's note.  Key new complaints: Denies angina or dyspnea.  Does not have any recent palpitations. Key examination changes: Regular rate and rhythm, 2/6 holosystolic murmur in the mid precordial area, normal JVP, no edema.  Clear lungs. Key new findings / data: ECG pending.  PLAN: Unfortunately, rifampin seems to be a necessary part of her treatment for her hip infection but has numerous drug interactions with important cardiac medications. Most importantly will make any direct oral anticoagulant available and unpredictable drug.  Only good option is switching to warfarin with prothrombin time monitoring while she is on rifampin.  The warfarin is necessary for her history of paroxysmal atrial fibrillation and stroke prevention, but is also needed for DVT/PE prophylaxis while she is relatively immobile after hip surgery.  Eliquis has been stopped.  Can cover the transition from Eliquis to warfarin with a few days of subcutaneous enoxaparin. In addition there is an important interaction between rifampin and ranolazine, with a reduction in the systemic levels of ranolazine and potentially a reduction in the antianginal effect.  This will be easier to manage.  She will be likely less active in the next several weeks as she recovers from hip surgery.  If angina does occur with increasing frequency we can probably compensate by using higher doses of long-acting nitrates.  The patient understands these plans and is in agreement.  Sanda Klein, MD, [place] [phone] 08/24/2017, 4:52 PM

## 2017-08-25 LAB — BASIC METABOLIC PANEL
Anion gap: 8 (ref 5–15)
BUN: <5 mg/dL — ABNORMAL LOW (ref 6–20)
CO2: 28 mmol/L (ref 22–32)
Calcium: 8.5 mg/dL — ABNORMAL LOW (ref 8.9–10.3)
Chloride: 109 mmol/L (ref 101–111)
Creatinine, Ser: 0.61 mg/dL (ref 0.44–1.00)
GFR calc Af Amer: >60 mL/min (ref >60)
GFR calc non Af Amer: >60 mL/min (ref >60)
Glucose, Bld: 117 mg/dL — ABNORMAL HIGH (ref 65–99)
Potassium: 3.6 mmol/L (ref 3.5–5.1)
Sodium: 145 mmol/L (ref 135–145)

## 2017-08-25 LAB — TYPE AND SCREEN
ABO/RH(D): A POS
Antibody Screen: NEGATIVE
Unit division: 0

## 2017-08-25 LAB — BPAM RBC
Blood Product Expiration Date: 201907092359
ISSUE DATE / TIME: 201906200835
Unit Type and Rh: 6200

## 2017-08-25 LAB — HEPATIC FUNCTION PANEL
ALT: 10 U/L — ABNORMAL LOW (ref 14–54)
AST: 13 U/L — ABNORMAL LOW (ref 15–41)
Albumin: 2.5 g/dL — ABNORMAL LOW (ref 3.5–5.0)
Alkaline Phosphatase: 50 U/L (ref 38–126)
Bilirubin, Direct: 0.2 mg/dL (ref 0.1–0.5)
Indirect Bilirubin: 0.5 mg/dL (ref 0.3–0.9)
Total Bilirubin: 0.7 mg/dL (ref 0.3–1.2)
Total Protein: 5.4 g/dL — ABNORMAL LOW (ref 6.5–8.1)

## 2017-08-25 LAB — PROTIME-INR
INR: 1.25
Prothrombin Time: 15.6 seconds — ABNORMAL HIGH (ref 11.4–15.2)

## 2017-08-25 LAB — CBC
HCT: 33.2 % — ABNORMAL LOW (ref 36.0–46.0)
Hemoglobin: 10.2 g/dL — ABNORMAL LOW (ref 12.0–15.0)
MCH: 27.3 pg (ref 26.0–34.0)
MCHC: 30.7 g/dL (ref 30.0–36.0)
MCV: 88.8 fL (ref 78.0–100.0)
Platelets: 186 10*3/uL (ref 150–400)
RBC: 3.74 MIL/uL — ABNORMAL LOW (ref 3.87–5.11)
RDW: 15.8 % — ABNORMAL HIGH (ref 11.5–15.5)
WBC: 7.3 10*3/uL (ref 4.0–10.5)

## 2017-08-25 MED ORDER — HEPARIN SOD (PORK) LOCK FLUSH 100 UNIT/ML IV SOLN
250.0000 [IU] | INTRAVENOUS | Status: AC | PRN
  Administered 2017-08-25: 13:00:00

## 2017-08-25 MED ORDER — WARFARIN SODIUM 5 MG PO TABS: 5.0000 mg | ORAL_TABLET | Freq: Every day | ORAL | 11 refills | Status: DC

## 2017-08-25 NOTE — Care Management Important Message (Signed)
Important Message  Patient Details  Name: ANASOPHIA PECOR MRN: [id number] Date of Birth: 1950/07/14   Medicare Important Message Given:  Yes    Barb Merino Cuero 08/25/2017, 12:48 PM

## 2017-08-25 NOTE — Progress Notes (Signed)
Subjective: Patient reports pain as mild to moderate.  Tolerating diet.  Urinating.  +Flatus.  No CP, SOB.  Mobilizing well w/ therapy.  Verbalizes understanding of infectious disease and cardiology recommendations for medicine and follow-up.  Objective:   VITALS:   Vitals:   08/24/17 0900 08/24/17 1102 08/24/17 1500 08/25/17 0518  BP: (!) 106/56 95/76 (!) 105/42 103/63  Pulse: 68 61 (!) 59 80  Resp: 18 18 20    Temp: 98.4 F (36.9 C) 98.2 F (36.8 C) 97.9 F (36.6 C) 98 F (36.7 C)  TempSrc: Oral Oral Oral Oral  SpO2: 95% 98% 97% 97%  Weight:      Height:       CBC Latest Ref Rng & Units 08/25/2017 08/24/2017 08/23/2017  WBC 4.0 - 10.5 K/uL 7.3 6.2 8.2  Hemoglobin 12.0 - 15.0 g/dL 10.2(L) 8.4(L) 9.3(L)  Hematocrit 36.0 - 46.0 % 33.2(L) 28.2(L) 30.5(L)  Platelets 150 - 400 K/uL 186 162 183   BMP Latest Ref Rng & Units 08/25/2017 08/23/2017 08/18/2017  Glucose 65 - 99 mg/dL 117(H) 122(H) 95  BUN 6 - 20 mg/dL <5(L) 13 16  Creatinine 0.44 - 1.00 mg/dL 0.61 0.71 0.74  BUN/Creat Ratio 12 - 28 - - -  Sodium 135 - 145 mmol/L 145 139 141  Potassium 3.5 - 5.1 mmol/L 3.6 3.8 3.9  Chloride 101 - 111 mmol/L 109 106 104  CO2 22 - 32 mmol/L 28 27 28   Calcium 8.9 - 10.3 mg/dL 8.5(L) 8.2(L) 8.9   Intake/Output      06/20 0701 - 06/21 0700 06/21 0701 - 06/22 0700   P.O. 1080    I.V. (mL/kg) 100 (1.3)    Blood 304    Total Intake(mL/kg) 1484 (19.9)    Urine (mL/kg/hr)     Drains     Total Output     Net +1484         Urine Occurrence 8 x       Physical Exam: General: NAD.  Upright in bed on arrival eating breakfast.  Calm and conversant.  No increased work of breathing.  MSK RLE: Neurovascularly intact Sensation intact distally Feet warm Dorsiflexion/Plantar flexion intact Incision: dressing C/D/I Hemovac in place with minimal bloody/serosanguineous drainage   Assessment / Plan: 3 Days Post-Op  S/P Procedure(s) (LRB): TOTAL HIP REVISION (Right) by Dr. Ernesta Amble.  [name] on 08/22/2017  Principal Problem:   Infection of right prosthetic hip joint ([facility]) Active Problems:   Hx of CABG   Hypertension   HLD (hyperlipidemia)   COPD (chronic obstructive pulmonary disease) (HCC)   Chronic back pain   GERD (gastroesophageal reflux disease)   PAF (paroxysmal atrial fibrillation) ([place])   Coronary artery disease involving native coronary artery of native heart without angina pectoris   Chronic anticoagulation   CAD S/P multiple PCIs-last one 2016   Acute combined systolic and diastolic heart failure (HCC)   Primary osteoarthritis of hip   Acute Blood Loss Anemia - hemoglobin back up after 1 unit PRBC yesterday 08/24/2017.  10.2<8.4< 9.3<10.8.  No sign of active bleeding.  Likely with delutional component.  Platelets within normal limits.  Energy improved.   Right prosthetic hip joint infection approximately 1 year after Right THA.  Now status post revision hip arthroplasty 08/22/2017  Doing well postop day 3.  The patient is eager and feels ready to go home.   Continue antibiotic therapy per infectious disease: Rifampin 300 mg twice daily.  Vancomycin per pharmacy.  Adjust based  on cultures.  Planning for 6 weeks of IV therapy then 6 weeks of oral antibiotics.  PICC insertion has been placed.  [facility] Coordinator will follow pt with ID team while inpatient to [facility] for home IV ABX at DC.  If patient discharges after hours, please call [phone].   --- Interaction between Eliquis and rifampin noted.    Cardiology evaluated the patient and recommended continuing Ranexa and starting Coumadin (started 08/24/2017).    INR this morning 1.25.  I discussed the patient's case with pharmacy this morning who recommends continuation of 5 mg/day with planned INR check by home health nursing who will communicate this with cardiology.   Incentive Spirometry Apply ice Continue chronic home medcines Weight Bearing:  Weight Bearing as Tolerated (WBAT) RLE Dressings: Maintain Mepilex and absorbent pressure dressing.   VTE prophylaxis: Coumadin, SCDs, ambulation.  Continue Plavix  Dispo: Home today.  Wendy [name] III, PA-C 08/25/2017, 8:12 AM

## 2017-08-25 NOTE — Progress Notes (Signed)
Discharge instructions given to pt. Right arm PICC was flushed and capped by IV team. Discharged to home accompanied by her sister. [facility] for home IV antibiotic.

## 2017-08-25 NOTE — Progress Notes (Signed)
Occupational Therapy Treatment and DISCHARGE  Patient Details Name: Wendy [name] MRN: [id number] DOB: [birth year] Today's Date: 08/25/2017    History of present illness Pt is a 67 y.o. 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    OT comments  Pt participates well, pleasant and cooperative.  Progressing well with OT.  Demonstrates ability to complete LB dressing using AE (reacher, sock aide) with supervision, toilet transfer with supervision using RW, toileting with modified independence, and grooming with supervision standing at sink with 0 hand support.  Balance and pain improving, not limiting patient during OT session today.  Patient agreeable to recommendations having assistance for bathing in shower, but patient plans to "wash up" for now.  Recommend reacher and sock aide to maximize independence with self care tasks.  Continue to recommend 24/7 supervision at time of discharge, patient reporting her Aunt will be able to assist as needed.  Thank you for this referral.  Patient is safe to dc home with support.  OT signing off.    Follow Up Recommendations  No OT follow up;Supervision/Assistance - 24 hour    Equipment Recommendations  Other (comment)(reacher, sock aide)    Recommendations for Other Services      Precautions / Restrictions Precautions Precautions: Fall Restrictions Weight Bearing Restrictions: Yes RLE Weight Bearing: Weight bearing as tolerated       Mobility Bed Mobility Overal bed mobility: Modified Independent Bed Mobility: Supine to Sit     Supine to sit: Modified independent (Device/Increase time)(no rails and bed at 0 degrees to simulate home enviornment )        Transfers Overall transfer level: Needs assistance Equipment used: Rolling walker (2 wheeled) Transfers: Sit to/from Stand Sit to Stand: Supervision         General transfer comment: good recall of hand placement and safety, no  cueing required, no physical assist     Balance Overall balance assessment: Needs assistance Sitting-balance support: No upper extremity supported;Feet supported Sitting balance-Leahy Scale: Good     Standing balance support: During functional activity;No upper extremity supported Standing balance-Leahy Scale: Fair Standing balance comment: self care tasks within base of support and no external support                            ADL either performed or assessed with clinical judgement   ADL Overall ADL's : Needs assistance/impaired     Grooming: Supervision/safety Grooming Details (indicate cue type and reason): standing at sink to wash hands with SPV, 0 hand support      Lower Body Bathing: Supervison/ safety Lower Body Bathing Details (indicate cue type and reason): edu on use of long sponge, completing bathing seated and having assistance for at least first shower when she is ready (plans to complete wash ups)     Lower Body Dressing: Supervision/safety Lower Body Dressing Details (indicate cue type and reason): educated on use of AE (reacher, sock aide) and where to purchase, patient return demonstrate use with supervision  Toilet Transfer: Supervision/safety;Ambulation;Comfort height toilet;Grab bars;RW Armed forces technical officer Details (indicate cue type and reason): demonstrates good safety and technqiues  Toileting- Clothing Manipulation and Hygiene: Modified independent;Sit to/from stand       Functional mobility during ADLs: Supervision/safety;Rolling walker General ADL Comments: good safety awareness and mobility today using RW, education provided with AE and patient able to return demonstrate use without cueing  Vision   Vision Assessment?: No apparent visual deficits   Perception     Praxis      Cognition Arousal/Alertness: Awake/alert Behavior During Therapy: WFL for tasks assessed/performed Overall Cognitive Status: Within Functional Limits for  tasks assessed                                          Exercises     Shoulder Instructions       General Comments      Pertinent Vitals/ Pain       Pain Assessment: Faces Faces Pain Scale: Hurts a little bit Pain Location: R hip Pain Descriptors / Indicators: Grimacing;Discomfort Pain Intervention(s): Limited activity within patient's tolerance;Monitored during session;Repositioned  Home Living                                          Prior Functioning/Environment              Frequency  Min 2X/week        Progress Toward Goals  OT Goals(current goals can now be found in the care plan section)  Progress towards OT goals: Goals met/education completed, patient discharged from OT  Acute Rehab OT Goals Patient Stated Goal: Home today OT Goal Formulation: With patient Time For Goal Achievement: 09/06/17 Potential to Achieve Goals: Good  Plan All goals met and education completed, patient discharged from OT services;Discharge plan remains appropriate    Co-evaluation                 AM-PAC PT "6 Clicks" Daily Activity     Outcome Measure   Help from another person eating meals?: None Help from another person taking care of personal grooming?: A Little Help from another person toileting, which includes using toliet, bedpan, or urinal?: None Help from another person bathing (including washing, rinsing, drying)?: A Little Help from another person to put on and taking off regular upper body clothing?: None Help from another person to put on and taking off regular lower body clothing?: A Little 6 Click Score: 21    End of Session Equipment Utilized During Treatment: Rolling walker  OT Visit Diagnosis: Other abnormalities of gait and mobility (R26.89);Pain Pain - Right/Left: Right Pain - part of body: Hip   Activity Tolerance Patient tolerated treatment well   Patient Left with bed alarm set;with call bell/phone  within reach(seated EOB )   Nurse Communication Mobility status(patient needs)        Time: 5396-7289 OT Time Calculation (min): 19 min  Charges: OT Treatments $Self Care/Home Management : 8-22 mins  Delight Stare, OTR/L  Pager [place] 08/25/2017, 9:49 AM

## 2017-08-25 NOTE — Discharge Summary (Addendum)
Discharge Summary  Patient ID: Wendy [name] MRN: [id number] DOB/AGE: 1951/01/16 67 y.o.  Admit date: 08/22/2017 Discharge date: 08/25/2017  Admission Diagnoses:  Infection of right prosthetic hip joint [facility])  Discharge Diagnoses:  Principal Problem:   Infection of right prosthetic hip joint ([place]) Active Problems:   Hx of CABG   Hypertension   HLD (hyperlipidemia)   COPD (chronic obstructive pulmonary disease) (HCC)   Chronic back pain   GERD (gastroesophageal reflux disease)   PAF (paroxysmal atrial fibrillation) ([place])   Coronary artery disease involving native coronary artery of native heart without angina pectoris   Chronic anticoagulation   CAD S/P multiple PCIs-last one 2016   Acute combined systolic and diastolic heart failure (HCC)   Primary osteoarthritis of hip   Past Medical History:  Diagnosis Date  . Anemia   . Anginal pain ([place])    jaw pain 07/2016, s/p 07/15/16 nuclear stress test  . Anxiety   . Arthritis    "lower back" (08/24/2017)  . CAD (coronary artery disease), native coronary artery    a. Hx CABG 1998, last PCI 2013 b.LHC 07/2013:  EF 55-60%, L-LAD prob atretic, no sig disease in LAD, S-OM1 ok with patent stent, S-dRCA ok => med Rx. c. LHC 01/2015 DES to SVG-RCA.  Marland Kitchen Chronic back pain   . Chronic leg pain   . Chronic lower back pain   . COPD (chronic obstructive pulmonary disease) ([place])   . Depression   . DVT (deep venous thrombosis) (HCC) X 3   RLE  . Fall 08/2016  . GERD (gastroesophageal reflux disease)   . Glucose intolerance (impaired glucose tolerance)   . Headache    "a few/month" (08/24/2017)  . HLD (hyperlipidemia)   . Hypertension   . Myocardial infarction ([place]) 1999  . OSA on CPAP   . PAD (peripheral artery disease) ([place])   . PAF (paroxysmal atrial fibrillation) ([place])    a. 09/2013 post-op b. 01/2015  . Peripheral neuropathy   . Shortness of breath    when walking  . Wears glasses     Surgeries: Procedure(s): TOTAL HIP  REVISION on 08/22/2017   Consultants (if any): Treatment Team:  Lbcardiology, Rounding, MD  Discharged Condition: Improved  Hospital Course: [name] is an 67 y.o. female who was admitted 08/22/2017 with a diagnosis of Infection of right prosthetic hip joint ([place]) and went to the operating room on 08/22/2017 and underwent the above named procedures.    She was given perioperative antibiotics:  Anti-infectives (From admission, onward)   Start     Dose/Rate Route Frequency Ordered Stop   08/24/17 0000  vancomycin IVPB     1,000 mg Intravenous Every 12 hours 08/24/17 0626     08/23/17 0800  rifampin (RIFADIN) capsule 300 mg     300 mg Oral 2 times daily with meals 08/22/17 2327     08/23/17 0000  vancomycin (VANCOCIN) IVPB 1000 mg/200 mL premix     1,000 mg 200 mL/hr over 60 Minutes Intravenous Every 12 hours 08/22/17 2355     08/22/17 1330  ceFAZolin (ANCEF) IVPB 2g/100 mL premix     2 g 200 mL/hr over 30 Minutes Intravenous On call to O.R. 08/22/17 1022 08/22/17 1610   08/22/17 1230  vancomycin (VANCOCIN) IVPB 1000 mg/200 mL premix     1,000 mg 200 mL/hr over 60 Minutes Intravenous On call to O.R. 08/22/17 1022 08/22/17 1359   08/22/17 1029  ceFAZolin (ANCEF) 2-4 GM/100ML-% IVPB  Note to Pharmacy:  Laurita Quint   : cabinet override      08/22/17 1029 08/22/17 1540   08/22/17 0000  rifampin (RIFADIN) 300 MG capsule     300 mg Oral 2 times daily 08/22/17 1900 10/03/17 2359    .  She was given sequential compression devices, early ambulation, and Eliquis changed to ASA 81 mg BID, then Coumadin per cardiology recommendations for DVT prophylaxis and A. fib.  Plavix Resumed POD1.  She benefited maximally from the hospital stay and there were no complications.  She mobilized with therapy and pain control steadily improved.  She had no signs of active bleeding postop and Hemovac was removed POD2.  She did receive Unit PRBC for symptomatic anemia POD2.  This improved POD3.  Iron was  recommended preoperatively for anemia and this was continued.      On day of discharge, her vitals were within normal limits, she was eating, drinking, and voiding, pain is controlled, and she was mobilizing well with therapy.  She was evaluated by Infectious Disease Team who made the following recommendations:  Discharge antibiotics: Per pharmacy protocol Vancomycin  trough 15-20              + Rifampin 300 mg BID   Duration: 6 weeks   End Date: July 31st   [place] and Maintenance Per Protocol _x_ Please pull PIC at completion of IV antibiotics __ Please leave PIC in place until doctor has seen patient or been notified  Labs weekly while on IV antibiotics: _x_ CBC with differential _x_ BMP TWICE WEEKLY** _x_  Liver Function WEEKLY  _x_ CRP _x_ ESR _x_ Vancomycin trough  Fax weekly labs to [phone]  Clinic Follow Up Appt: 4 weeks with Dr. Baxter Flattery or Colletta Maryland, NP @ [facility]       Recent vital signs:  Vitals:   08/24/17 1500 08/25/17 0518  BP: (!) 105/42 103/63  Pulse: (!) 59 80  Resp: 20   Temp: 97.9 F (36.6 C) 98 F (36.7 C)  SpO2: 97% 97%    Recent laboratory studies:  Lab Results  Component Value Date   HGB 10.2 (L) 08/25/2017   HGB 8.4 (L) 08/24/2017   HGB 9.3 (L) 08/23/2017   Lab Results  Component Value Date   WBC 7.3 08/25/2017   PLT 186 08/25/2017   Lab Results  Component Value Date   INR 1.25 08/25/2017   Lab Results  Component Value Date   NA 145 08/25/2017   K 3.6 08/25/2017   CL 109 08/25/2017   CO2 28 08/25/2017   BUN <5 (L) 08/25/2017   CREATININE 0.61 08/25/2017   GLUCOSE 117 (H) 08/25/2017    Discharge Medications:   Allergies as of 08/25/2017      Reactions   Benadryl [diphenhydramine] Shortness Of Breath   Adhesive [tape] Other (See Comments)   Burning of skin   Amoxicillin Diarrhea, Other (See Comments)   Has patient had a PCN reaction causing immediate rash, facial/tongue/throat swelling, SOB or  lightheadedness with hypotension: No Has patient had a PCN reaction causing severe rash involving mucus membranes or skin necrosis: No Has patient had a PCN reaction that required hospitalization No Has patient had a PCN reaction occurring within the last 10 years: Yes -- noted rxn as diarrhea If all of the above answers are "NO", then may proceed with Cephalosporin use.   Flexeril [cyclobenzaprine] Other (See Comments)   Causes restless leg syndrome   Hydrocodone Itching  Medication List    STOP taking these medications   apixaban 5 MG Tabs tablet Commonly known as:  ELIQUIS   doxycycline 100 MG capsule Commonly known as:  VIBRAMYCIN   oxyCODONE-acetaminophen 5-325 MG tablet Commonly known as:  ROXICET     TAKE these medications   acetaminophen 500 MG tablet Commonly known as:  TYLENOL Take 2 tablets (1,000 mg total) by mouth every 8 (eight) hours for 14 days. For Pain. What changed:    when to take this  reasons to take this  additional instructions   albuterol 108 (90 Base) MCG/ACT inhaler Commonly known as:  PROVENTIL HFA;VENTOLIN HFA Inhale 2 puffs into the lungs every 6 (six) hours as needed for wheezing or shortness of breath.   ALPRAZolam 0.5 MG tablet Commonly known as:  XANAX Take 0.5 mg by mouth 3 (three) times daily as needed for anxiety.   atorvastatin 80 MG tablet Commonly known as:  LIPITOR Take 1 tablet (80 mg total) by mouth daily at 6 PM.   baclofen 20 MG tablet Commonly known as:  LIORESAL Take 20 mg by mouth 3 (three) times daily as needed for muscle spasms.   buPROPion 150 MG 24 hr tablet Commonly known as:  WELLBUTRIN XL Take 150 mg by mouth daily.   cetirizine 10 MG tablet Commonly known as:  ZYRTEC Take 10 mg by mouth daily as needed for allergies.   cholecalciferol 1000 units tablet Commonly known as:  VITAMIN D Take 2,000 Units by mouth daily.   clopidogrel 75 MG tablet Commonly known as:  PLAVIX Take 75 mg by mouth  daily.   diclofenac sodium 1 % Gel Commonly known as:  VOLTAREN Apply 1 g topically daily as needed (pain).   docusate sodium 100 MG capsule Commonly known as:  COLACE Take 1 capsule (100 mg total) by mouth 2 (two) times daily. To prevent constipation while taking pain medication.   ezetimibe 10 MG tablet Commonly known as:  ZETIA Take 10 mg by mouth daily.   furosemide 20 MG tablet Commonly known as:  LASIX Take 1 tablet (20 mg total) daily by mouth. What changed:    when to take this  reasons to take this   isosorbide mononitrate 60 MG 24 hr tablet Commonly known as:  IMDUR Take 1 tablet (60 mg total) by mouth every morning.   metoprolol tartrate 50 MG tablet Commonly known as:  LOPRESSOR Take 1 tablet (50 mg total) by mouth 2 (two) times daily.   nitroGLYCERIN 0.4 MG SL tablet Commonly known as:  NITROSTAT Place 1 tablet (0.4 mg total) under the tongue every 5 (five) minutes as needed for chest pain.   oxyCODONE 5 MG immediate release tablet Commonly known as:  ROXICODONE Take 1 tablet (5 mg total) by mouth every 4 (four) hours as needed for breakthrough pain. What changed:    when to take this  reasons to take this   pantoprazole 40 MG tablet Commonly known as:  PROTONIX Take 1 tablet (40 mg total) daily by mouth.   pramipexole 0.25 MG tablet Commonly known as:  MIRAPEX Take 0.25 mg by mouth at bedtime as needed (for restless leg).   RANEXA 1000 MG SR tablet Generic drug:  ranolazine Take 1,000 mg by mouth 2 (two) times daily.   rifampin 300 MG capsule Commonly known as:  RIFADIN Take 1 capsule (300 mg total) by mouth 2 (two) times daily.   sertraline 100 MG tablet Commonly known as:  ZOLOFT Take 150  mg by mouth daily.   vancomycin IVPB Inject 1,000 mg into the vein every 12 (twelve) hours. Indication: right hip infection Last Day of Therapy:  10/04/17 Labs - Sunday/Monday:  CBC/D, BMP, and vancomycin trough. Labs - Thursday:  BMP and vancomycin  trough Labs - Every other week:  ESR and CRP   warfarin 5 MG tablet Commonly known as:  COUMADIN Take 1 tablet (5 mg total) by mouth daily. You will have your INR checked at home by your nursing team who will contact Cardiology.  You may need a refill of this Rx by Cardiology to cover 6 weeks of therapy.  Your dosing may change based on your INR value.            Home Infusion Instuctions  (From admission, onward)        Start     Ordered   08/24/17 0000  Home infusion instructions [facility] May follow [facility] Dosing Protocol; May administer Cathflo as needed to maintain patency of vascular access device.; Flushing of vascular access device: per [facility] Protocol: 0.9% NaCl pre/post medica...    Question Answer Comment  Instructions May follow [facility] Dosing Protocol   Instructions May administer Cathflo as needed to maintain patency of vascular access device.   Instructions Flushing of vascular access device: per [facility] Protocol: 0.9% NaCl pre/post medication administration and prn patency; Heparin 100 u/ml, 5ml for implanted ports and Heparin 10u/ml, 5ml for all other central venous catheters.   Instructions May follow [facility] Anaphylaxis Protocol for First Dose Administration in the home: 0.9% NaCl at 25-50 ml/hr to maintain IV access for protocol meds. Epinephrine 0.3 ml IV/IM PRN and Benadryl 25-50 IV/IM PRN s/s of anaphylaxis.   Instructions [facility] Infusion Coordinator (RN) to assist per patient IV care needs in the home PRN.      06 /20/19 0626      Diagnostic Studies: Dg C-arm 1-60 Min  Result Date: 08/22/2017 CLINICAL DATA:  67 year old post right hip revision. Initial encounter. EXAM: DG C-ARM 61-120 MIN; OPERATIVE RIGHT HIP WITH PELVIS COMPARISON:  08/15/2016 plain film exam. Fluoroscopic time: 6 seconds. FINDINGS: Two intraoperative C-arm views submitted for review after surgery. Right hip prosthesis appears in satisfactory position without complication  noted on this frontal projection. IMPRESSION: Post right hip replacement. Electronically Signed   By: Genia Del M.D.   On: 08/22/2017 18:33   Dg Hip Port Unilat With Pelvis 1v Right  Result Date: 08/23/2017 CLINICAL DATA:  Right hip arthroplasty EXAM: DG HIP (WITH OR WITHOUT PELVIS) 1V PORT RIGHT COMPARISON:  Intraoperative radiographs of the right hip. FINDINGS: Uncemented right total hip arthroplasty is noted with single overlying drain in place. Surgical clips project over the right and left groin. No immediate postoperative complications. Expected postop soft tissue emphysema along the lateral right thigh. IMPRESSION: Intact right uncemented total hip arthroplasty with drain in place. Electronically Signed   By: Ashley Royalty M.D.   On: 08/23/2017 02:16   Dg Hip Operative Unilat W Or W/o Pelvis Right  Result Date: 08/22/2017 CLINICAL DATA:  67 year old post right hip revision. Initial encounter. EXAM: DG C-ARM 61-120 MIN; OPERATIVE RIGHT HIP WITH PELVIS COMPARISON:  08/15/2016 plain film exam. Fluoroscopic time: 6 seconds. FINDINGS: Two intraoperative C-arm views submitted for review after surgery. Right hip prosthesis appears in satisfactory position without complication noted on this frontal projection. IMPRESSION: Post right hip replacement. Electronically Signed   By: Genia Del M.D.   On: 08/22/2017 18:33  [place] Ekg Site Rite  Result Date: 08/22/2017 If Site Rite image not attached, placement could not be confirmed due to current cardiac rhythm.   Disposition: Discharge disposition: 01-Home or Self Care       Discharge Instructions    Home infusion instructions [place] May follow [place] Dosing Protocol; May administer Cathflo as needed to maintain patency of vascular access device.; Flushing of vascular access device: per [facility] Protocol: 0.9% NaCl pre/post medica...   Complete by:  As directed    Instructions:  May follow [place] Dosing Protocol    Instructions:  May administer Cathflo as needed to maintain patency of vascular access device.   Instructions:  Flushing of vascular access device: per [facility] Protocol: 0.9% NaCl pre/post medication administration and prn patency; Heparin 100 u/ml, 58m for implanted ports and Heparin 10u/ml, 529mfor all other central venous catheters.   Instructions:  May follow [facility] Anaphylaxis Protocol for First Dose Administration in the home: 0.9% NaCl at 25-50 ml/hr to maintain IV access for protocol meds. Epinephrine 0.3 ml IV/IM PRN and Benadryl 25-50 IV/IM PRN s/s of anaphylaxis.   Instructions:  [facility] Coordinator (RN) to assist per patient IV care needs in the home PRN.      Follow-up Information    MuRenette ButtersMD Follow up.   Specialty:  Orthopedic Surgery Contact information: [address] [phone]        [facility] Call in 4 week(s).   Specialty:  Infectious Diseases Why:  Please call for follow up appointment in 4 weeks with Dr. SnBaxter Flatteryr StColletta MarylandNP  Contact information: [phone]     Home[facility] Follow up.   Specialty:  [place]:  A representative from [facility] will contact you to arrange start date and time for your Home Health services. Contact information: [id number] [place] [phone]          Signed: HePrudencio BurlyII PA-C 08/25/2017, 8:05 AM

## 2017-08-28 LAB — AEROBIC/ANAEROBIC CULTURE (SURGICAL/DEEP WOUND)

## 2017-08-28 LAB — AEROBIC/ANAEROBIC CULTURE W GRAM STAIN (SURGICAL/DEEP WOUND): Culture: NO GROWTH

## 2017-08-29 ENCOUNTER — Encounter: Payer: Self-pay | Admitting: Infectious Diseases

## 2017-08-29 ENCOUNTER — Ambulatory Visit (INDEPENDENT_AMBULATORY_CARE_PROVIDER_SITE_OTHER): Payer: Medicare HMO | Admitting: Cardiology

## 2017-08-29 DIAGNOSIS — J449 Chronic obstructive pulmonary disease, unspecified: Secondary | ICD-10-CM | POA: Diagnosis not present

## 2017-08-29 DIAGNOSIS — Z7901 Long term (current) use of anticoagulants: Secondary | ICD-10-CM | POA: Diagnosis not present

## 2017-08-29 DIAGNOSIS — I110 Hypertensive heart disease with heart failure: Secondary | ICD-10-CM | POA: Diagnosis not present

## 2017-08-29 DIAGNOSIS — I480 Paroxysmal atrial fibrillation: Secondary | ICD-10-CM

## 2017-08-29 DIAGNOSIS — M48061 Spinal stenosis, lumbar region without neurogenic claudication: Secondary | ICD-10-CM | POA: Diagnosis not present

## 2017-08-29 DIAGNOSIS — I2510 Atherosclerotic heart disease of native coronary artery without angina pectoris: Secondary | ICD-10-CM | POA: Diagnosis not present

## 2017-08-29 DIAGNOSIS — T8451XA Infection and inflammatory reaction due to internal right hip prosthesis, initial encounter: Secondary | ICD-10-CM | POA: Diagnosis not present

## 2017-08-29 DIAGNOSIS — I5041 Acute combined systolic (congestive) and diastolic (congestive) heart failure: Secondary | ICD-10-CM | POA: Diagnosis not present

## 2017-08-29 DIAGNOSIS — I739 Peripheral vascular disease, unspecified: Secondary | ICD-10-CM | POA: Diagnosis not present

## 2017-08-29 DIAGNOSIS — F329 Major depressive disorder, single episode, unspecified: Secondary | ICD-10-CM | POA: Diagnosis not present

## 2017-08-29 LAB — POCT INR: INR: 1.1 — AB (ref 2.0–3.0)

## 2017-08-30 ENCOUNTER — Encounter (HOSPITAL_COMMUNITY): Payer: Self-pay | Admitting: Orthopedic Surgery

## 2017-09-01 ENCOUNTER — Encounter: Payer: Self-pay | Admitting: Infectious Diseases

## 2017-09-01 ENCOUNTER — Ambulatory Visit (INDEPENDENT_AMBULATORY_CARE_PROVIDER_SITE_OTHER): Payer: Medicare HMO | Admitting: Internal Medicine

## 2017-09-01 ENCOUNTER — Other Ambulatory Visit: Payer: Self-pay | Admitting: Pharmacist

## 2017-09-01 DIAGNOSIS — I2510 Atherosclerotic heart disease of native coronary artery without angina pectoris: Secondary | ICD-10-CM | POA: Diagnosis not present

## 2017-09-01 DIAGNOSIS — Z7901 Long term (current) use of anticoagulants: Secondary | ICD-10-CM | POA: Diagnosis not present

## 2017-09-01 DIAGNOSIS — I739 Peripheral vascular disease, unspecified: Secondary | ICD-10-CM | POA: Diagnosis not present

## 2017-09-01 DIAGNOSIS — M48061 Spinal stenosis, lumbar region without neurogenic claudication: Secondary | ICD-10-CM | POA: Diagnosis not present

## 2017-09-01 DIAGNOSIS — I480 Paroxysmal atrial fibrillation: Secondary | ICD-10-CM | POA: Diagnosis not present

## 2017-09-01 DIAGNOSIS — J449 Chronic obstructive pulmonary disease, unspecified: Secondary | ICD-10-CM | POA: Diagnosis not present

## 2017-09-01 DIAGNOSIS — I5041 Acute combined systolic (congestive) and diastolic (congestive) heart failure: Secondary | ICD-10-CM | POA: Diagnosis not present

## 2017-09-01 DIAGNOSIS — F329 Major depressive disorder, single episode, unspecified: Secondary | ICD-10-CM | POA: Diagnosis not present

## 2017-09-01 DIAGNOSIS — T8451XA Infection and inflammatory reaction due to internal right hip prosthesis, initial encounter: Secondary | ICD-10-CM | POA: Diagnosis not present

## 2017-09-01 DIAGNOSIS — I110 Hypertensive heart disease with heart failure: Secondary | ICD-10-CM | POA: Diagnosis not present

## 2017-09-01 LAB — POCT INR: INR: 1.3 — AB (ref 2.0–3.0)

## 2017-09-01 NOTE — Progress Notes (Signed)
[facility] pharmacy lab review  

## 2017-09-02 DIAGNOSIS — T8451XA Infection and inflammatory reaction due to internal right hip prosthesis, initial encounter: Secondary | ICD-10-CM | POA: Diagnosis not present

## 2017-09-02 DIAGNOSIS — G609 Hereditary and idiopathic neuropathy, unspecified: Secondary | ICD-10-CM | POA: Diagnosis not present

## 2017-09-02 DIAGNOSIS — J449 Chronic obstructive pulmonary disease, unspecified: Secondary | ICD-10-CM | POA: Diagnosis not present

## 2017-09-05 ENCOUNTER — Telehealth: Payer: Self-pay | Admitting: Pharmacist

## 2017-09-05 ENCOUNTER — Ambulatory Visit (INDEPENDENT_AMBULATORY_CARE_PROVIDER_SITE_OTHER): Payer: Medicare HMO | Admitting: Cardiology

## 2017-09-05 ENCOUNTER — Encounter: Payer: Self-pay | Admitting: Infectious Diseases

## 2017-09-05 DIAGNOSIS — T8451XA Infection and inflammatory reaction due to internal right hip prosthesis, initial encounter: Secondary | ICD-10-CM | POA: Diagnosis not present

## 2017-09-05 DIAGNOSIS — Z7901 Long term (current) use of anticoagulants: Secondary | ICD-10-CM | POA: Diagnosis not present

## 2017-09-05 DIAGNOSIS — M48061 Spinal stenosis, lumbar region without neurogenic claudication: Secondary | ICD-10-CM | POA: Diagnosis not present

## 2017-09-05 DIAGNOSIS — I480 Paroxysmal atrial fibrillation: Secondary | ICD-10-CM

## 2017-09-05 DIAGNOSIS — Z5181 Encounter for therapeutic drug level monitoring: Secondary | ICD-10-CM | POA: Diagnosis not present

## 2017-09-05 DIAGNOSIS — I739 Peripheral vascular disease, unspecified: Secondary | ICD-10-CM | POA: Diagnosis not present

## 2017-09-05 DIAGNOSIS — I110 Hypertensive heart disease with heart failure: Secondary | ICD-10-CM | POA: Diagnosis not present

## 2017-09-05 DIAGNOSIS — I2510 Atherosclerotic heart disease of native coronary artery without angina pectoris: Secondary | ICD-10-CM | POA: Diagnosis not present

## 2017-09-05 DIAGNOSIS — Z79899 Other long term (current) drug therapy: Secondary | ICD-10-CM | POA: Diagnosis not present

## 2017-09-05 DIAGNOSIS — I5041 Acute combined systolic (congestive) and diastolic (congestive) heart failure: Secondary | ICD-10-CM | POA: Diagnosis not present

## 2017-09-05 DIAGNOSIS — J449 Chronic obstructive pulmonary disease, unspecified: Secondary | ICD-10-CM | POA: Diagnosis not present

## 2017-09-05 DIAGNOSIS — F329 Major depressive disorder, single episode, unspecified: Secondary | ICD-10-CM | POA: Diagnosis not present

## 2017-09-05 LAB — POCT INR: INR: 1.8 — AB (ref 2.0–3.0)

## 2017-09-05 NOTE — Telephone Encounter (Signed)
Called and spoke to Coretta at [facility] and asked to add LFTs to patient's weekly labs since she is on rifampin. Coretta verbalized understanding.

## 2017-09-05 NOTE — Patient Instructions (Signed)
Description   Spoke with Antony Madura nurse with [facility] while in the home with pt and instructed to have pt continue  taking 15mg  QD per Derrill Memo).  Order given to [facility] with [facility] to recheck INR in 1 week. Pt taking rifampin - will likely need large warfarin dose increases to become therapeutic.

## 2017-09-06 ENCOUNTER — Other Ambulatory Visit: Payer: Self-pay | Admitting: Pharmacist

## 2017-09-06 DIAGNOSIS — M71051 Abscess of bursa, right hip: Secondary | ICD-10-CM | POA: Diagnosis not present

## 2017-09-06 NOTE — Progress Notes (Signed)
[facility] pharmacy lab review  

## 2017-09-07 ENCOUNTER — Other Ambulatory Visit
Admission: RE | Admit: 2017-09-07 | Discharge: 2017-09-07 | Disposition: A | Discharge status: Home / Self Care | Payer: Medicare HMO | Source: Ambulatory Visit | Attending: Internal Medicine | Admitting: Internal Medicine

## 2017-09-07 ENCOUNTER — Encounter: Payer: Self-pay | Admitting: Infectious Diseases

## 2017-09-07 DIAGNOSIS — I69351 Hemiplegia and hemiparesis following cerebral infarction affecting right dominant side: Secondary | ICD-10-CM | POA: Diagnosis not present

## 2017-09-07 DIAGNOSIS — I2510 Atherosclerotic heart disease of native coronary artery without angina pectoris: Secondary | ICD-10-CM | POA: Diagnosis not present

## 2017-09-07 DIAGNOSIS — J449 Chronic obstructive pulmonary disease, unspecified: Secondary | ICD-10-CM | POA: Diagnosis not present

## 2017-09-07 DIAGNOSIS — I739 Peripheral vascular disease, unspecified: Secondary | ICD-10-CM | POA: Diagnosis not present

## 2017-09-07 DIAGNOSIS — I110 Hypertensive heart disease with heart failure: Secondary | ICD-10-CM | POA: Diagnosis not present

## 2017-09-07 DIAGNOSIS — F329 Major depressive disorder, single episode, unspecified: Secondary | ICD-10-CM | POA: Diagnosis not present

## 2017-09-07 DIAGNOSIS — I5041 Acute combined systolic (congestive) and diastolic (congestive) heart failure: Secondary | ICD-10-CM | POA: Diagnosis not present

## 2017-09-07 DIAGNOSIS — N390 Urinary tract infection, site not specified: Secondary | ICD-10-CM | POA: Diagnosis not present

## 2017-09-07 DIAGNOSIS — T8451XA Infection and inflammatory reaction due to internal right hip prosthesis, initial encounter: Secondary | ICD-10-CM | POA: Insufficient documentation

## 2017-09-07 DIAGNOSIS — I480 Paroxysmal atrial fibrillation: Secondary | ICD-10-CM | POA: Diagnosis not present

## 2017-09-07 DIAGNOSIS — M48061 Spinal stenosis, lumbar region without neurogenic claudication: Secondary | ICD-10-CM | POA: Diagnosis not present

## 2017-09-07 LAB — CBC WITH DIFFERENTIAL/PLATELET
Basophils Absolute: 0.1 10*3/uL (ref 0–0.1)
Basophils Relative: 1 %
Eosinophils Absolute: 0.1 10*3/uL (ref 0–0.7)
Eosinophils Relative: 2 %
HCT: 36.1 % (ref 35.0–47.0)
Hemoglobin: 11.8 g/dL — ABNORMAL LOW (ref 12.0–16.0)
Lymphocytes Relative: 15 %
Lymphs Abs: 1 10*3/uL (ref 1.0–3.6)
MCH: 28.4 pg (ref 26.0–34.0)
MCHC: 32.7 g/dL (ref 32.0–36.0)
MCV: 86.9 fL (ref 80.0–100.0)
Monocytes Absolute: 0.4 10*3/uL (ref 0.2–0.9)
Monocytes Relative: 6 %
Neutro Abs: 5 10*3/uL (ref 1.4–6.5)
Neutrophils Relative %: 76 %
Platelets: 289 10*3/uL (ref 150–440)
RBC: 4.16 MIL/uL (ref 3.80–5.20)
RDW: 18.5 % — ABNORMAL HIGH (ref 11.5–14.5)
WBC: 6.6 10*3/uL (ref 3.6–11.0)

## 2017-09-08 ENCOUNTER — Ambulatory Visit: Payer: Medicare HMO

## 2017-09-09 DIAGNOSIS — J449 Chronic obstructive pulmonary disease, unspecified: Secondary | ICD-10-CM | POA: Diagnosis not present

## 2017-09-09 DIAGNOSIS — G609 Hereditary and idiopathic neuropathy, unspecified: Secondary | ICD-10-CM | POA: Diagnosis not present

## 2017-09-11 ENCOUNTER — Encounter: Payer: Self-pay | Admitting: Infectious Diseases

## 2017-09-11 DIAGNOSIS — I480 Paroxysmal atrial fibrillation: Secondary | ICD-10-CM | POA: Diagnosis not present

## 2017-09-11 DIAGNOSIS — I110 Hypertensive heart disease with heart failure: Secondary | ICD-10-CM | POA: Diagnosis not present

## 2017-09-11 DIAGNOSIS — T8451XA Infection and inflammatory reaction due to internal right hip prosthesis, initial encounter: Secondary | ICD-10-CM | POA: Diagnosis not present

## 2017-09-11 DIAGNOSIS — I2510 Atherosclerotic heart disease of native coronary artery without angina pectoris: Secondary | ICD-10-CM | POA: Diagnosis not present

## 2017-09-11 DIAGNOSIS — I739 Peripheral vascular disease, unspecified: Secondary | ICD-10-CM | POA: Diagnosis not present

## 2017-09-11 DIAGNOSIS — F329 Major depressive disorder, single episode, unspecified: Secondary | ICD-10-CM | POA: Diagnosis not present

## 2017-09-11 DIAGNOSIS — I5041 Acute combined systolic (congestive) and diastolic (congestive) heart failure: Secondary | ICD-10-CM | POA: Diagnosis not present

## 2017-09-11 DIAGNOSIS — M48061 Spinal stenosis, lumbar region without neurogenic claudication: Secondary | ICD-10-CM | POA: Diagnosis not present

## 2017-09-11 DIAGNOSIS — J449 Chronic obstructive pulmonary disease, unspecified: Secondary | ICD-10-CM | POA: Diagnosis not present

## 2017-09-12 ENCOUNTER — Ambulatory Visit (INDEPENDENT_AMBULATORY_CARE_PROVIDER_SITE_OTHER): Payer: Medicare HMO | Admitting: Internal Medicine

## 2017-09-12 DIAGNOSIS — Z7901 Long term (current) use of anticoagulants: Secondary | ICD-10-CM | POA: Diagnosis not present

## 2017-09-12 DIAGNOSIS — T8451XA Infection and inflammatory reaction due to internal right hip prosthesis, initial encounter: Secondary | ICD-10-CM | POA: Diagnosis not present

## 2017-09-12 DIAGNOSIS — Z5181 Encounter for therapeutic drug level monitoring: Secondary | ICD-10-CM | POA: Diagnosis not present

## 2017-09-12 DIAGNOSIS — G609 Hereditary and idiopathic neuropathy, unspecified: Secondary | ICD-10-CM | POA: Diagnosis not present

## 2017-09-12 DIAGNOSIS — I2510 Atherosclerotic heart disease of native coronary artery without angina pectoris: Secondary | ICD-10-CM | POA: Diagnosis not present

## 2017-09-12 DIAGNOSIS — I480 Paroxysmal atrial fibrillation: Secondary | ICD-10-CM

## 2017-09-12 DIAGNOSIS — G4733 Obstructive sleep apnea (adult) (pediatric): Secondary | ICD-10-CM | POA: Diagnosis not present

## 2017-09-12 DIAGNOSIS — J449 Chronic obstructive pulmonary disease, unspecified: Secondary | ICD-10-CM | POA: Diagnosis not present

## 2017-09-12 DIAGNOSIS — F329 Major depressive disorder, single episode, unspecified: Secondary | ICD-10-CM | POA: Diagnosis not present

## 2017-09-12 DIAGNOSIS — M48061 Spinal stenosis, lumbar region without neurogenic claudication: Secondary | ICD-10-CM | POA: Diagnosis not present

## 2017-09-12 DIAGNOSIS — I5041 Acute combined systolic (congestive) and diastolic (congestive) heart failure: Secondary | ICD-10-CM | POA: Diagnosis not present

## 2017-09-12 DIAGNOSIS — I739 Peripheral vascular disease, unspecified: Secondary | ICD-10-CM | POA: Diagnosis not present

## 2017-09-12 DIAGNOSIS — I110 Hypertensive heart disease with heart failure: Secondary | ICD-10-CM | POA: Diagnosis not present

## 2017-09-12 LAB — POCT INR: INR: 1.2 — AB (ref 2.0–3.0)

## 2017-09-12 NOTE — Patient Instructions (Signed)
Description   Spoke with Baker Janus nurse with [facility] while in the home with pt and instructed to have pt start taking 20mg  QD per Sherian Rein D Jinny Blossom).  Order given to [facility] with [facility] to recheck INR on Monday July 15th Pt taking rifampin - will likely need large warfarin dose increases to become therapeutic.

## 2017-09-15 ENCOUNTER — Encounter: Payer: Self-pay | Admitting: Infectious Diseases

## 2017-09-15 ENCOUNTER — Other Ambulatory Visit: Payer: Self-pay | Admitting: Pharmacist

## 2017-09-15 DIAGNOSIS — T8451XA Infection and inflammatory reaction due to internal right hip prosthesis, initial encounter: Secondary | ICD-10-CM | POA: Diagnosis not present

## 2017-09-15 DIAGNOSIS — I739 Peripheral vascular disease, unspecified: Secondary | ICD-10-CM | POA: Diagnosis not present

## 2017-09-15 DIAGNOSIS — F329 Major depressive disorder, single episode, unspecified: Secondary | ICD-10-CM | POA: Diagnosis not present

## 2017-09-15 DIAGNOSIS — I5041 Acute combined systolic (congestive) and diastolic (congestive) heart failure: Secondary | ICD-10-CM | POA: Diagnosis not present

## 2017-09-15 DIAGNOSIS — J449 Chronic obstructive pulmonary disease, unspecified: Secondary | ICD-10-CM | POA: Diagnosis not present

## 2017-09-15 DIAGNOSIS — I110 Hypertensive heart disease with heart failure: Secondary | ICD-10-CM | POA: Diagnosis not present

## 2017-09-15 DIAGNOSIS — I480 Paroxysmal atrial fibrillation: Secondary | ICD-10-CM | POA: Diagnosis not present

## 2017-09-15 DIAGNOSIS — M48061 Spinal stenosis, lumbar region without neurogenic claudication: Secondary | ICD-10-CM | POA: Diagnosis not present

## 2017-09-15 DIAGNOSIS — I2510 Atherosclerotic heart disease of native coronary artery without angina pectoris: Secondary | ICD-10-CM | POA: Diagnosis not present

## 2017-09-15 NOTE — Progress Notes (Signed)
[facility] pharmacy lab review  

## 2017-09-16 DIAGNOSIS — J449 Chronic obstructive pulmonary disease, unspecified: Secondary | ICD-10-CM | POA: Diagnosis not present

## 2017-09-16 DIAGNOSIS — G609 Hereditary and idiopathic neuropathy, unspecified: Secondary | ICD-10-CM | POA: Diagnosis not present

## 2017-09-18 ENCOUNTER — Ambulatory Visit (INDEPENDENT_AMBULATORY_CARE_PROVIDER_SITE_OTHER): Payer: Medicare HMO | Admitting: Cardiology

## 2017-09-18 ENCOUNTER — Telehealth: Payer: Self-pay | Admitting: Pharmacist

## 2017-09-18 DIAGNOSIS — J449 Chronic obstructive pulmonary disease, unspecified: Secondary | ICD-10-CM | POA: Diagnosis not present

## 2017-09-18 DIAGNOSIS — I5041 Acute combined systolic (congestive) and diastolic (congestive) heart failure: Secondary | ICD-10-CM | POA: Diagnosis not present

## 2017-09-18 DIAGNOSIS — Z7901 Long term (current) use of anticoagulants: Secondary | ICD-10-CM | POA: Diagnosis not present

## 2017-09-18 DIAGNOSIS — T8451XA Infection and inflammatory reaction due to internal right hip prosthesis, initial encounter: Secondary | ICD-10-CM | POA: Diagnosis not present

## 2017-09-18 DIAGNOSIS — I110 Hypertensive heart disease with heart failure: Secondary | ICD-10-CM | POA: Diagnosis not present

## 2017-09-18 DIAGNOSIS — M48061 Spinal stenosis, lumbar region without neurogenic claudication: Secondary | ICD-10-CM | POA: Diagnosis not present

## 2017-09-18 DIAGNOSIS — I2510 Atherosclerotic heart disease of native coronary artery without angina pectoris: Secondary | ICD-10-CM | POA: Diagnosis not present

## 2017-09-18 DIAGNOSIS — I480 Paroxysmal atrial fibrillation: Secondary | ICD-10-CM

## 2017-09-18 DIAGNOSIS — F329 Major depressive disorder, single episode, unspecified: Secondary | ICD-10-CM | POA: Diagnosis not present

## 2017-09-18 DIAGNOSIS — T8149XD Infection following a procedure, other surgical site, subsequent encounter: Secondary | ICD-10-CM | POA: Diagnosis not present

## 2017-09-18 DIAGNOSIS — I739 Peripheral vascular disease, unspecified: Secondary | ICD-10-CM | POA: Diagnosis not present

## 2017-09-18 LAB — POCT INR: INR: 1.5 — AB (ref 2.0–3.0)

## 2017-09-18 NOTE — Patient Instructions (Signed)
Description   Spoke with Wendy Croon LPN with [facility] while in the home with pt and instructed to have pt take 30mg  today then start taking 25mg  QD (discussed with Jinny Blossom, PharmD).  Order given to [place] with [facility] to recheck INR on Monday.  Pt taking rifampin - will likely need large warfarin dose increases to become therapeutic.

## 2017-09-18 NOTE — Telephone Encounter (Signed)
Called and gave another verbal order to [place] at [facility] to check patient's LFTs weekly. She verbalized understanding. Hopefully they will be done this week.

## 2017-09-21 DIAGNOSIS — I2510 Atherosclerotic heart disease of native coronary artery without angina pectoris: Secondary | ICD-10-CM | POA: Diagnosis not present

## 2017-09-21 DIAGNOSIS — I110 Hypertensive heart disease with heart failure: Secondary | ICD-10-CM | POA: Diagnosis not present

## 2017-09-21 DIAGNOSIS — T8149XD Infection following a procedure, other surgical site, subsequent encounter: Secondary | ICD-10-CM | POA: Diagnosis not present

## 2017-09-21 DIAGNOSIS — F329 Major depressive disorder, single episode, unspecified: Secondary | ICD-10-CM | POA: Diagnosis not present

## 2017-09-21 DIAGNOSIS — J449 Chronic obstructive pulmonary disease, unspecified: Secondary | ICD-10-CM | POA: Diagnosis not present

## 2017-09-21 DIAGNOSIS — G609 Hereditary and idiopathic neuropathy, unspecified: Secondary | ICD-10-CM | POA: Diagnosis not present

## 2017-09-21 DIAGNOSIS — I480 Paroxysmal atrial fibrillation: Secondary | ICD-10-CM | POA: Diagnosis not present

## 2017-09-21 DIAGNOSIS — I5041 Acute combined systolic (congestive) and diastolic (congestive) heart failure: Secondary | ICD-10-CM | POA: Diagnosis not present

## 2017-09-21 DIAGNOSIS — I739 Peripheral vascular disease, unspecified: Secondary | ICD-10-CM | POA: Diagnosis not present

## 2017-09-21 DIAGNOSIS — M48061 Spinal stenosis, lumbar region without neurogenic claudication: Secondary | ICD-10-CM | POA: Diagnosis not present

## 2017-09-21 DIAGNOSIS — T8451XA Infection and inflammatory reaction due to internal right hip prosthesis, initial encounter: Secondary | ICD-10-CM | POA: Diagnosis not present

## 2017-09-22 ENCOUNTER — Other Ambulatory Visit: Payer: Self-pay | Admitting: Pharmacist

## 2017-09-22 LAB — FUNGUS CULTURE WITH STAIN

## 2017-09-22 LAB — FUNGUS CULTURE RESULT

## 2017-09-22 LAB — FUNGAL ORGANISM REFLEX

## 2017-09-22 NOTE — Telephone Encounter (Signed)
Still no LFTs on this patient. Again, called [facility] and talked to Jeani Hawking this time and asked that they be drawn. He states he has put in the orders multiple times but will make sure again.

## 2017-09-22 NOTE — Progress Notes (Signed)
[facility] pharmacy lab review  

## 2017-09-25 ENCOUNTER — Encounter: Payer: Self-pay | Admitting: Infectious Diseases

## 2017-09-25 ENCOUNTER — Ambulatory Visit (INDEPENDENT_AMBULATORY_CARE_PROVIDER_SITE_OTHER): Payer: Medicare HMO | Admitting: Internal Medicine

## 2017-09-25 DIAGNOSIS — T8451XA Infection and inflammatory reaction due to internal right hip prosthesis, initial encounter: Secondary | ICD-10-CM | POA: Diagnosis not present

## 2017-09-25 DIAGNOSIS — M48061 Spinal stenosis, lumbar region without neurogenic claudication: Secondary | ICD-10-CM | POA: Diagnosis not present

## 2017-09-25 DIAGNOSIS — Z7901 Long term (current) use of anticoagulants: Secondary | ICD-10-CM

## 2017-09-25 DIAGNOSIS — I110 Hypertensive heart disease with heart failure: Secondary | ICD-10-CM | POA: Diagnosis not present

## 2017-09-25 DIAGNOSIS — F329 Major depressive disorder, single episode, unspecified: Secondary | ICD-10-CM | POA: Diagnosis not present

## 2017-09-25 DIAGNOSIS — I480 Paroxysmal atrial fibrillation: Secondary | ICD-10-CM | POA: Diagnosis not present

## 2017-09-25 DIAGNOSIS — I2510 Atherosclerotic heart disease of native coronary artery without angina pectoris: Secondary | ICD-10-CM | POA: Diagnosis not present

## 2017-09-25 DIAGNOSIS — I739 Peripheral vascular disease, unspecified: Secondary | ICD-10-CM | POA: Diagnosis not present

## 2017-09-25 DIAGNOSIS — T8149XD Infection following a procedure, other surgical site, subsequent encounter: Secondary | ICD-10-CM | POA: Diagnosis not present

## 2017-09-25 DIAGNOSIS — I5041 Acute combined systolic (congestive) and diastolic (congestive) heart failure: Secondary | ICD-10-CM | POA: Diagnosis not present

## 2017-09-25 DIAGNOSIS — J449 Chronic obstructive pulmonary disease, unspecified: Secondary | ICD-10-CM | POA: Diagnosis not present

## 2017-09-25 LAB — POCT INR: INR: 3.5 — AB (ref 2.0–3.0)

## 2017-09-27 ENCOUNTER — Ambulatory Visit: Payer: Medicare HMO

## 2017-09-28 DIAGNOSIS — F329 Major depressive disorder, single episode, unspecified: Secondary | ICD-10-CM | POA: Diagnosis not present

## 2017-09-28 DIAGNOSIS — I480 Paroxysmal atrial fibrillation: Secondary | ICD-10-CM | POA: Diagnosis not present

## 2017-09-28 DIAGNOSIS — I739 Peripheral vascular disease, unspecified: Secondary | ICD-10-CM | POA: Diagnosis not present

## 2017-09-28 DIAGNOSIS — T8451XA Infection and inflammatory reaction due to internal right hip prosthesis, initial encounter: Secondary | ICD-10-CM | POA: Diagnosis not present

## 2017-09-28 DIAGNOSIS — I110 Hypertensive heart disease with heart failure: Secondary | ICD-10-CM | POA: Diagnosis not present

## 2017-09-28 DIAGNOSIS — I10 Essential (primary) hypertension: Secondary | ICD-10-CM | POA: Diagnosis not present

## 2017-09-28 DIAGNOSIS — M48061 Spinal stenosis, lumbar region without neurogenic claudication: Secondary | ICD-10-CM | POA: Diagnosis not present

## 2017-09-28 DIAGNOSIS — I2510 Atherosclerotic heart disease of native coronary artery without angina pectoris: Secondary | ICD-10-CM | POA: Diagnosis not present

## 2017-09-28 DIAGNOSIS — I5041 Acute combined systolic (congestive) and diastolic (congestive) heart failure: Secondary | ICD-10-CM | POA: Diagnosis not present

## 2017-09-28 DIAGNOSIS — J449 Chronic obstructive pulmonary disease, unspecified: Secondary | ICD-10-CM | POA: Diagnosis not present

## 2017-09-29 DIAGNOSIS — J449 Chronic obstructive pulmonary disease, unspecified: Secondary | ICD-10-CM | POA: Diagnosis not present

## 2017-09-29 DIAGNOSIS — G609 Hereditary and idiopathic neuropathy, unspecified: Secondary | ICD-10-CM | POA: Diagnosis not present

## 2017-10-02 ENCOUNTER — Ambulatory Visit (INDEPENDENT_AMBULATORY_CARE_PROVIDER_SITE_OTHER): Payer: Medicare HMO | Admitting: Internal Medicine

## 2017-10-02 ENCOUNTER — Encounter: Payer: Self-pay | Admitting: Infectious Diseases

## 2017-10-02 ENCOUNTER — Ambulatory Visit (INDEPENDENT_AMBULATORY_CARE_PROVIDER_SITE_OTHER): Payer: Medicare HMO | Admitting: Infectious Diseases

## 2017-10-02 ENCOUNTER — Other Ambulatory Visit: Payer: Self-pay | Admitting: Cardiovascular Disease

## 2017-10-02 VITALS — BP 134/75 | HR 67 | Temp 98.0°F | Wt 165.0 lb

## 2017-10-02 DIAGNOSIS — Z5181 Encounter for therapeutic drug level monitoring: Secondary | ICD-10-CM | POA: Diagnosis not present

## 2017-10-02 DIAGNOSIS — J449 Chronic obstructive pulmonary disease, unspecified: Secondary | ICD-10-CM | POA: Diagnosis not present

## 2017-10-02 DIAGNOSIS — I110 Hypertensive heart disease with heart failure: Secondary | ICD-10-CM | POA: Diagnosis not present

## 2017-10-02 DIAGNOSIS — I200 Unstable angina: Secondary | ICD-10-CM

## 2017-10-02 DIAGNOSIS — T8451XA Infection and inflammatory reaction due to internal right hip prosthesis, initial encounter: Secondary | ICD-10-CM | POA: Diagnosis not present

## 2017-10-02 DIAGNOSIS — T8451XD Infection and inflammatory reaction due to internal right hip prosthesis, subsequent encounter: Secondary | ICD-10-CM | POA: Diagnosis not present

## 2017-10-02 DIAGNOSIS — F329 Major depressive disorder, single episode, unspecified: Secondary | ICD-10-CM | POA: Diagnosis not present

## 2017-10-02 DIAGNOSIS — Z7901 Long term (current) use of anticoagulants: Secondary | ICD-10-CM | POA: Diagnosis not present

## 2017-10-02 DIAGNOSIS — M48061 Spinal stenosis, lumbar region without neurogenic claudication: Secondary | ICD-10-CM | POA: Diagnosis not present

## 2017-10-02 DIAGNOSIS — I2510 Atherosclerotic heart disease of native coronary artery without angina pectoris: Secondary | ICD-10-CM | POA: Diagnosis not present

## 2017-10-02 DIAGNOSIS — I480 Paroxysmal atrial fibrillation: Secondary | ICD-10-CM | POA: Diagnosis not present

## 2017-10-02 DIAGNOSIS — I739 Peripheral vascular disease, unspecified: Secondary | ICD-10-CM | POA: Diagnosis not present

## 2017-10-02 DIAGNOSIS — T8149XD Infection following a procedure, other surgical site, subsequent encounter: Secondary | ICD-10-CM | POA: Diagnosis not present

## 2017-10-02 DIAGNOSIS — I5041 Acute combined systolic (congestive) and diastolic (congestive) heart failure: Secondary | ICD-10-CM | POA: Diagnosis not present

## 2017-10-02 LAB — POCT INR: INR: 1.8 — AB (ref 2.0–3.0)

## 2017-10-02 MED ORDER — RIFAMPIN 300 MG PO CAPS: 300.0000 mg | ORAL_CAPSULE | Freq: Two times a day (BID) | ORAL | 0 refills | Status: DC

## 2017-10-02 MED ORDER — DOXYCYCLINE HYCLATE 100 MG PO TABS: 100.0000 mg | ORAL_TABLET | Freq: Two times a day (BID) | ORAL | 0 refills | Status: AC

## 2017-10-02 NOTE — Assessment & Plan Note (Signed)
Ongoing and uncontrolled. Not taking ranexa at all presently with DDI with rifampin. She has not notified her cardiology team - I will reach out to them but have asked her as well to call. ?if she would benefit from alternative agent as she is half way through rifampin therapy presently.

## 2017-10-02 NOTE — Progress Notes (Signed)
Patient: Wendy [name]  DOB: 06-12-1950 MRN: [id number] PCP: Josetta Huddle, MD  Referring Provider: hospital follow up   Patient Active Problem List   Diagnosis Date Noted  . Long term (current) use of anticoagulants 08/29/2017  . Infection of right prosthetic hip joint ([place]) 08/16/2017  . Acute combined systolic and diastolic heart failure ([place]) 07/26/2016  . Troponin level elevated 07/26/2016  . Chronic anticoagulation 07/22/2016  . CAD S/P multiple PCIs-last one 2016 07/22/2016  . NSTEMI (non-ST elevated myocardial infarction) ([place]) 07/22/2016  . Primary osteoarthritis of right hip 06/27/2016  . Avascular necrosis of hip, right ([employer]) 06/27/2016  . PVD (peripheral vascular disease) ([place])   . PAF (paroxysmal atrial fibrillation) ([place]) 01/21/2015  . Essential (primary) hypertension 01/14/2015  . Coronary artery disease involving native coronary artery of native heart without angina pectoris 10/06/2014  . Acute angina ([place]) 05/12/2014  . Epigastric pain   . Chest pain with high risk of acute coronary syndrome 10/21/2013  . Acute respiratory failure ([place]) 09/20/2013  . Nonspecific abnormal results of liver function study 09/17/2013  . Cholecystitis, acute 09/15/2013  . PAD (peripheral artery disease) ([place])   . COPD (chronic obstructive pulmonary disease) ([place])   . Peripheral neuropathy   . Chronic back pain   . Sleep apnea   . Anxiety   . GERD (gastroesophageal reflux disease)   . 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 Hypertension 07/20/2013  . HLD (hyperlipidemia) 07/20/2013  . Unstable angina ([place]) 07/19/2013  . Hx of CABG 07/19/2013     Subjective:  KESTREL MIS is a 67 y.o. female here today for ongoing care related to her right hip prosthesis infection. She is a patient of Dr. Alain Marion and was taken to the OR for wash out and revision with polyethylene liner exchange and new ceramic head on 08/22/17 following aspirate that was concerning for infection despite  being on doxycycline/rifampin per her PCP.   She has done well with her IV vancomycin and tolerating the PO rifampin well from what she can tell. Denies any rash, diarrhea or yeast infection symptoms. She does still have pain at the right hip and feels it is more than she expected. She has completed physical therapy. Denies any fevers or chills or night sweats. No drainage from incision. Only complaint is epigastric pain x 1 week. Concerned as to what that could be. No associated weight loss, vomiting or nausea.   She is tearful today as she lost her 39 yo dog last week which is very difficult for her. Also had to bury her daughter in March of this year. She lives with aunt and nephew.   Review of Systems  Constitutional: Negative for chills, diaphoresis, fever, malaise/fatigue and weight loss.  Respiratory: Negative for cough, sputum production and shortness of breath.   Cardiovascular: Positive for chest pain (nightly ). Negative for orthopnea and leg swelling.  Gastrointestinal: Positive for abdominal pain (epigastric ).  Genitourinary: Negative for dysuria.  Musculoskeletal: Positive for back pain (chronic low back ) and joint pain (right hip ). Negative for myalgias.  Skin: Negative for rash.  Neurological: Negative for dizziness and headaches.  Psychiatric/Behavioral: Positive for depression (reactionary with death of pet ).    Past Medical History:  Diagnosis Date  . Anemia   . Anginal pain ([place])    jaw pain 07/2016, s/p 07/15/16 nuclear stress test  . Anxiety   . Arthritis    "lower back" (08/24/2017)  . CAD (coronary artery disease),  native coronary artery    a. Hx CABG 1998, last PCI 2013 b.LHC 07/2013:  EF 55-60%, L-LAD prob atretic, no sig disease in LAD, S-OM1 ok with patent stent, S-dRCA ok => med Rx. c. LHC 01/2015 DES to SVG-RCA.  Marland Kitchen Chronic back pain   . Chronic leg pain   . Chronic lower back pain   . COPD (chronic obstructive pulmonary disease) ([place])   . Depression   .  DVT (deep venous thrombosis) ([facility]) X 3   RLE  . Fall 08/2016  . GERD (gastroesophageal reflux disease)   . Glucose intolerance (impaired glucose tolerance)   . Headache    "a few/month" (08/24/2017)  . HLD (hyperlipidemia)   . Hypertension   . Myocardial infarction ([place]) 1999  . OSA on CPAP   . PAD (peripheral artery disease) ([place])   . PAF (paroxysmal atrial fibrillation) ([place])    a. 09/2013 post-op b. 01/2015  . Peripheral neuropathy   . Shortness of breath    when walking  . Wears glasses     Outpatient Medications Prior to Visit  Medication Sig Dispense Refill  . warfarin (COUMADIN) 5 MG tablet Take 1 tablet (5 mg total) by mouth daily. You will have your INR checked at home by your nursing team who will contact Cardiology.  You may need a refill of this Rx by Cardiology to cover 6 weeks of therapy.  Your dosing may change based on your INR value. 30 tablet 11  . albuterol (PROVENTIL HFA;VENTOLIN HFA) 108 (90 BASE) MCG/ACT inhaler Inhale 2 puffs into the lungs every 6 (six) hours as needed for wheezing or shortness of breath.    . ALPRAZolam (XANAX) 0.5 MG tablet Take 0.5 mg by mouth 3 (three) times daily as needed for anxiety.    Marland Kitchen atorvastatin (LIPITOR) 80 MG tablet Take 1 tablet (80 mg total) by mouth daily at 6 PM. 90 tablet 3  . baclofen (LIORESAL) 20 MG tablet Take 20 mg by mouth 3 (three) times daily as needed for muscle spasms.    Marland Kitchen buPROPion (WELLBUTRIN XL) 150 MG 24 hr tablet Take 150 mg by mouth daily.    . cetirizine (ZYRTEC) 10 MG tablet Take 10 mg by mouth daily as needed for allergies.     . cholecalciferol (VITAMIN D) 1000 units tablet Take 2,000 Units by mouth daily.    . clopidogrel (PLAVIX) 75 MG tablet Take 75 mg by mouth daily.    . diclofenac sodium (VOLTAREN) 1 % GEL Apply 1 g topically daily as needed (pain).    Marland Kitchen docusate sodium (COLACE) 100 MG capsule Take 1 capsule (100 mg total) by mouth 2 (two) times daily. To prevent constipation while taking pain  medication. 60 capsule 0  . ezetimibe (ZETIA) 10 MG tablet Take 10 mg by mouth daily.    . furosemide (LASIX) 20 MG tablet Take 1 tablet (20 mg total) daily by mouth. (Patient taking differently: Take 20 mg by mouth daily as needed for fluid. ) 90 tablet 3  . isosorbide mononitrate (IMDUR) 60 MG 24 hr tablet Take 1 tablet (60 mg total) by mouth every morning. 90 tablet 2  . metoprolol tartrate (LOPRESSOR) 50 MG tablet Take 1 tablet (50 mg total) by mouth 2 (two) times daily. 180 tablet 3  . nitroGLYCERIN (NITROSTAT) 0.4 MG SL tablet DISSOLVE 1 TAB UNDER TONGUE FOR CHEST PAIN - IF PAIN REMAINS AFTER 5 MIN, CALL 911 AND REPEAT DOSE. MAX 3 TABS IN 15 MINUTES 75 tablet  1  . pantoprazole (PROTONIX) 40 MG tablet Take 1 tablet (40 mg total) daily by mouth. 90 tablet 3  . pramipexole (MIRAPEX) 0.25 MG tablet Take 0.25 mg by mouth at bedtime as needed (for restless leg).     . ranolazine (RANEXA) 1000 MG SR tablet Take 1,000 mg by mouth 2 (two) times daily.    . sertraline (ZOLOFT) 100 MG tablet Take 150 mg by mouth daily.     . vancomycin IVPB Inject 1,000 mg into the vein every 12 (twelve) hours. Indication: right hip infection Last Day of Therapy:  10/04/17 Labs - Sunday/Monday:  CBC/D, BMP, and vancomycin trough. Labs - Thursday:  BMP and vancomycin trough Labs - Every other week:  ESR and CRP 84 Units 0  . rifampin (RIFADIN) 300 MG capsule Take 1 capsule (300 mg total) by mouth 2 (two) times daily. 84 capsule 0   Facility-Administered Medications Prior to Visit  Medication Dose Route Frequency Provider Last Rate Last Dose  . ferrous sulfate tablet 325 mg  325 mg Oral BID WC Irene Shipper, MD         Allergies  Allergen Reactions  . Benadryl [Diphenhydramine] Shortness Of Breath  . Adhesive [Tape] Other (See Comments)    Burning of skin  . Amoxicillin Diarrhea and Other (See Comments)    Has patient had a PCN reaction causing immediate rash, facial/tongue/throat swelling, SOB or lightheadedness  with hypotension: No Has patient had a PCN reaction causing severe rash involving mucus membranes or skin necrosis: No Has patient had a PCN reaction that required hospitalization No Has patient had a PCN reaction occurring within the last 10 years: Yes -- noted rxn as diarrhea If all of the above answers are "NO", then may proceed with Cephalosporin use.   Yvette Rack [Cyclobenzaprine] Other (See Comments)    Causes restless leg syndrome  . Hydrocodone Itching    Social History   Tobacco Use  . Smoking status: Former Smoker    Packs/day: 0.50    Years: 34.00    Pack years: 17.00    Types: Cigarettes    Last attempt to quit: 07/20/1998    Years since quitting: 19.2  . Smokeless tobacco: Never Used  Substance Use Topics  . Alcohol use: Not Currently  . Drug use: Not Currently    Types: Marijuana    Family History  Problem Relation Age of Onset  . Lung cancer Mother   . Clotting disorder Father   . Lung cancer Sister   . Heart disease Brother   . Heart disease Brother   . Colon cancer Neg Hx     Objective:   Vitals:   10/02/17 1401  BP: 134/75  Pulse: 67  Temp: 98 F (36.7 C)  Weight: 165 lb (74.8 kg)   Body mass index is 31.18 kg/m.  Physical Exam  Constitutional: She is oriented to person, place, and time. She appears well-developed and well-nourished.  Seated comfortably in the chair today. Well groomed. Appears sad.   HENT:  Mouth/Throat: No oropharyngeal exudate.  Eyes: Pupils are equal, round, and reactive to light. No scleral icterus.  Neck: No JVD present.  Cardiovascular: Normal rate and regular rhythm.  Murmur heard. Pulmonary/Chest: Effort normal. No respiratory distress. She has no rales.  Abdominal: She exhibits no distension. There is tenderness (epiastric).  Scar from previous cholecystectomy noted   Musculoskeletal:       Right hip: She exhibits normal range of motion and no swelling.  Legs: Surgery incision well healed without  swelling, erythema or edema. Small rubbery nodule at the distal incision that feels like scar tissue.  Some grimacing trying to stand from chair position.   Lymphadenopathy:    She has no cervical adenopathy.  Neurological: She is alert and oriented to person, place, and time.  Skin: Skin is warm and dry. Capillary refill takes less than 2 seconds.  Psychiatric: She has a normal mood and affect. Judgment normal.  Vitals reviewed.   Lab Results: Lab Results  Component Value Date   WBC 6.6 09/07/2017   HGB 11.8 (L) 09/07/2017   HCT 36.1 09/07/2017   MCV 86.9 09/07/2017   PLT 289 09/07/2017    Lab Results  Component Value Date   CREATININE 0.61 08/25/2017   BUN <5 (L) 08/25/2017   NA 145 08/25/2017   K 3.6 08/25/2017   CL 109 08/25/2017   CO2 28 08/25/2017    Lab Results  Component Value Date   ALT 10 (L) 08/25/2017   AST 13 (L) 08/25/2017   ALKPHOS 50 08/25/2017   BILITOT 0.7 08/25/2017     Assessment & Plan:   Problem List Items Addressed This Visit      Cardiovascular and Mediastinum   Unstable angina ([place])    Ongoing and uncontrolled. Not taking ranexa at all presently with DDI with rifampin. She has not notified her cardiology team - I will reach out to them but have asked her as well to call. ?if she would benefit from alternative agent as she is half way through rifampin therapy presently.         Musculoskeletal and Integument   Infection of right prosthetic hip joint ([facility])    Right hip s/p Still with some pain during daily functions but is getting up and around well without walking aid. She is now 6w post op. Has follow up with orthopedic team soon. Incision itself is well healed and without swelling/erythema. Se has 2 days left of IV vancomycin + Rifampin PO and will transition to Doxycycline + Rifampin x 6 weeks. We have requested LFTs from [facility] but they have not been done yet - will check today with complaints of upper abdominal pain. No signs of acute  hepatitis related to medication today.  Return in 6 weeks to follow up and consider stopping all antibiotics at that point.        Other Visit Diagnoses    Medication monitoring encounter    -  Primary   Relevant Orders   Hepatic function panel     Janene Madeira, MSN, NP-C [facility] Pager: [phone] Office: [phone]  10/02/17  4:17 PM

## 2017-10-02 NOTE — Patient Instructions (Signed)
Complete IV antibiotics on 7/31   On 8/1 I would like for you to start taking Doxycycline 1 pill twice a day with food. Careful with sun exposure - can cause bad sunburn. Continue taking your Rifampin.   Will send a note to your cardiology team to see if any other alternative for your chest pain while on the rifampin. I would encourage you to call as well to discuss.   Please follow up again in 6 weeks

## 2017-10-02 NOTE — Assessment & Plan Note (Signed)
Right hip s/p Still with some pain during daily functions but is getting up and around well without walking aid. She is now 6w post op. Has follow up with orthopedic team soon. Incision itself is well healed and without swelling/erythema. Se has 2 days left of IV vancomycin + Rifampin PO and will transition to Doxycycline + Rifampin x 6 weeks. We have requested LFTs from [facility] but they have not been done yet - will check today with complaints of upper abdominal pain. No signs of acute hepatitis related to medication today.  Return in 6 weeks to follow up and consider stopping all antibiotics at that point.

## 2017-10-03 LAB — HEPATIC FUNCTION PANEL
AG Ratio: 1.4 (calc) (ref 1.0–2.5)
ALT: 17 U/L (ref 6–29)
AST: 18 U/L (ref 10–35)
Albumin: 3.7 g/dL (ref 3.6–5.1)
Alkaline phosphatase (APISO): 105 U/L (ref 33–130)
Bilirubin, Direct: 0.1 mg/dL (ref 0.0–0.2)
Globulin: 2.6 g/dL (calc) (ref 1.9–3.7)
Indirect Bilirubin: 0.2 mg/dL (calc) (ref 0.2–1.2)
Total Bilirubin: 0.3 mg/dL (ref 0.2–1.2)
Total Protein: 6.3 g/dL (ref 6.1–8.1)

## 2017-10-04 DIAGNOSIS — M71051 Abscess of bursa, right hip: Secondary | ICD-10-CM | POA: Diagnosis not present

## 2017-10-05 ENCOUNTER — Encounter: Payer: Self-pay | Admitting: Infectious Diseases

## 2017-10-05 DIAGNOSIS — T8451XA Infection and inflammatory reaction due to internal right hip prosthesis, initial encounter: Secondary | ICD-10-CM | POA: Diagnosis not present

## 2017-10-05 DIAGNOSIS — I110 Hypertensive heart disease with heart failure: Secondary | ICD-10-CM | POA: Diagnosis not present

## 2017-10-05 DIAGNOSIS — F329 Major depressive disorder, single episode, unspecified: Secondary | ICD-10-CM | POA: Diagnosis not present

## 2017-10-05 DIAGNOSIS — I739 Peripheral vascular disease, unspecified: Secondary | ICD-10-CM | POA: Diagnosis not present

## 2017-10-05 DIAGNOSIS — I5041 Acute combined systolic (congestive) and diastolic (congestive) heart failure: Secondary | ICD-10-CM | POA: Diagnosis not present

## 2017-10-05 DIAGNOSIS — M48061 Spinal stenosis, lumbar region without neurogenic claudication: Secondary | ICD-10-CM | POA: Diagnosis not present

## 2017-10-05 DIAGNOSIS — I2510 Atherosclerotic heart disease of native coronary artery without angina pectoris: Secondary | ICD-10-CM | POA: Diagnosis not present

## 2017-10-05 DIAGNOSIS — J449 Chronic obstructive pulmonary disease, unspecified: Secondary | ICD-10-CM | POA: Diagnosis not present

## 2017-10-05 DIAGNOSIS — I480 Paroxysmal atrial fibrillation: Secondary | ICD-10-CM | POA: Diagnosis not present

## 2017-10-06 ENCOUNTER — Other Ambulatory Visit: Payer: Self-pay | Admitting: Pharmacist

## 2017-10-06 NOTE — Progress Notes (Signed)
[facility] pharmacy lab review  

## 2017-10-09 ENCOUNTER — Ambulatory Visit (INDEPENDENT_AMBULATORY_CARE_PROVIDER_SITE_OTHER): Payer: Medicare HMO | Admitting: Internal Medicine

## 2017-10-09 DIAGNOSIS — I5041 Acute combined systolic (congestive) and diastolic (congestive) heart failure: Secondary | ICD-10-CM | POA: Diagnosis not present

## 2017-10-09 DIAGNOSIS — Z7901 Long term (current) use of anticoagulants: Secondary | ICD-10-CM

## 2017-10-09 DIAGNOSIS — I739 Peripheral vascular disease, unspecified: Secondary | ICD-10-CM | POA: Diagnosis not present

## 2017-10-09 DIAGNOSIS — T8451XA Infection and inflammatory reaction due to internal right hip prosthesis, initial encounter: Secondary | ICD-10-CM | POA: Diagnosis not present

## 2017-10-09 DIAGNOSIS — F329 Major depressive disorder, single episode, unspecified: Secondary | ICD-10-CM | POA: Diagnosis not present

## 2017-10-09 DIAGNOSIS — I2510 Atherosclerotic heart disease of native coronary artery without angina pectoris: Secondary | ICD-10-CM | POA: Diagnosis not present

## 2017-10-09 DIAGNOSIS — M48061 Spinal stenosis, lumbar region without neurogenic claudication: Secondary | ICD-10-CM | POA: Diagnosis not present

## 2017-10-09 DIAGNOSIS — I110 Hypertensive heart disease with heart failure: Secondary | ICD-10-CM | POA: Diagnosis not present

## 2017-10-09 DIAGNOSIS — J449 Chronic obstructive pulmonary disease, unspecified: Secondary | ICD-10-CM | POA: Diagnosis not present

## 2017-10-09 DIAGNOSIS — I480 Paroxysmal atrial fibrillation: Secondary | ICD-10-CM

## 2017-10-09 LAB — POCT INR: INR: 2.1 (ref 2.0–3.0)

## 2017-10-09 NOTE — Patient Instructions (Signed)
Description   Spoke with Ashok Croon LPN with [facility] instructed to have pt continue taking 25mg  QD except 20mg  on Tuesdays and Saturdays.  Order given to Powers with [facility] to recheck INR on Monday. Pt taking rifampin.

## 2017-10-10 DIAGNOSIS — F329 Major depressive disorder, single episode, unspecified: Secondary | ICD-10-CM | POA: Diagnosis not present

## 2017-10-10 DIAGNOSIS — J449 Chronic obstructive pulmonary disease, unspecified: Secondary | ICD-10-CM | POA: Diagnosis not present

## 2017-10-10 DIAGNOSIS — I739 Peripheral vascular disease, unspecified: Secondary | ICD-10-CM | POA: Diagnosis not present

## 2017-10-10 DIAGNOSIS — M48061 Spinal stenosis, lumbar region without neurogenic claudication: Secondary | ICD-10-CM | POA: Diagnosis not present

## 2017-10-10 DIAGNOSIS — I5041 Acute combined systolic (congestive) and diastolic (congestive) heart failure: Secondary | ICD-10-CM | POA: Diagnosis not present

## 2017-10-10 DIAGNOSIS — I480 Paroxysmal atrial fibrillation: Secondary | ICD-10-CM | POA: Diagnosis not present

## 2017-10-10 DIAGNOSIS — I2510 Atherosclerotic heart disease of native coronary artery without angina pectoris: Secondary | ICD-10-CM | POA: Diagnosis not present

## 2017-10-10 DIAGNOSIS — I110 Hypertensive heart disease with heart failure: Secondary | ICD-10-CM | POA: Diagnosis not present

## 2017-10-10 DIAGNOSIS — T8451XA Infection and inflammatory reaction due to internal right hip prosthesis, initial encounter: Secondary | ICD-10-CM | POA: Diagnosis not present

## 2017-10-17 ENCOUNTER — Ambulatory Visit (INDEPENDENT_AMBULATORY_CARE_PROVIDER_SITE_OTHER): Payer: Medicare HMO

## 2017-10-17 DIAGNOSIS — I480 Paroxysmal atrial fibrillation: Secondary | ICD-10-CM

## 2017-10-17 DIAGNOSIS — I739 Peripheral vascular disease, unspecified: Secondary | ICD-10-CM | POA: Diagnosis not present

## 2017-10-17 DIAGNOSIS — I2510 Atherosclerotic heart disease of native coronary artery without angina pectoris: Secondary | ICD-10-CM | POA: Diagnosis not present

## 2017-10-17 DIAGNOSIS — Z7901 Long term (current) use of anticoagulants: Secondary | ICD-10-CM

## 2017-10-17 DIAGNOSIS — T8451XA Infection and inflammatory reaction due to internal right hip prosthesis, initial encounter: Secondary | ICD-10-CM | POA: Diagnosis not present

## 2017-10-17 DIAGNOSIS — J449 Chronic obstructive pulmonary disease, unspecified: Secondary | ICD-10-CM | POA: Diagnosis not present

## 2017-10-17 DIAGNOSIS — I110 Hypertensive heart disease with heart failure: Secondary | ICD-10-CM | POA: Diagnosis not present

## 2017-10-17 DIAGNOSIS — M48061 Spinal stenosis, lumbar region without neurogenic claudication: Secondary | ICD-10-CM | POA: Diagnosis not present

## 2017-10-17 DIAGNOSIS — I5041 Acute combined systolic (congestive) and diastolic (congestive) heart failure: Secondary | ICD-10-CM | POA: Diagnosis not present

## 2017-10-17 DIAGNOSIS — F329 Major depressive disorder, single episode, unspecified: Secondary | ICD-10-CM | POA: Diagnosis not present

## 2017-10-17 LAB — POCT INR: INR: 1.1 — AB (ref 2.0–3.0)

## 2017-10-17 MED ORDER — APIXABAN 5 MG PO TABS: 5.0000 mg | ORAL_TABLET | Freq: Two times a day (BID) | ORAL | 3 refills | Status: DC

## 2017-10-17 NOTE — Patient Instructions (Signed)
Description   Spoke with Ashok Croon LPN with [facility] while in pt's home, pt stopped Rifampin on 10/11/17, stopped Warfarin on 10/14/17, and resumed Eliquis 5mg  BID on 10/15/17 on her own.

## 2017-10-18 ENCOUNTER — Ambulatory Visit
Admission: RE | Admit: 2017-10-18 | Discharge: 2017-10-18 | Disposition: A | Discharge status: Home / Self Care | Payer: Medicare HMO | Source: Ambulatory Visit | Attending: Internal Medicine | Admitting: Internal Medicine

## 2017-10-18 DIAGNOSIS — Z1231 Encounter for screening mammogram for malignant neoplasm of breast: Secondary | ICD-10-CM

## 2017-10-19 ENCOUNTER — Other Ambulatory Visit: Payer: Self-pay | Admitting: Internal Medicine

## 2017-10-19 DIAGNOSIS — R928 Other abnormal and inconclusive findings on diagnostic imaging of breast: Secondary | ICD-10-CM

## 2017-10-24 ENCOUNTER — Inpatient Hospital Stay: Admission: RE | Admit: 2017-10-24 | Payer: Medicare HMO | Source: Ambulatory Visit

## 2017-10-25 DIAGNOSIS — J449 Chronic obstructive pulmonary disease, unspecified: Secondary | ICD-10-CM | POA: Diagnosis not present

## 2017-10-25 DIAGNOSIS — T8451XA Infection and inflammatory reaction due to internal right hip prosthesis, initial encounter: Secondary | ICD-10-CM | POA: Diagnosis not present

## 2017-10-25 DIAGNOSIS — M48061 Spinal stenosis, lumbar region without neurogenic claudication: Secondary | ICD-10-CM | POA: Diagnosis not present

## 2017-10-25 DIAGNOSIS — I739 Peripheral vascular disease, unspecified: Secondary | ICD-10-CM | POA: Diagnosis not present

## 2017-10-25 DIAGNOSIS — F329 Major depressive disorder, single episode, unspecified: Secondary | ICD-10-CM | POA: Diagnosis not present

## 2017-10-25 DIAGNOSIS — I480 Paroxysmal atrial fibrillation: Secondary | ICD-10-CM | POA: Diagnosis not present

## 2017-10-25 DIAGNOSIS — I2510 Atherosclerotic heart disease of native coronary artery without angina pectoris: Secondary | ICD-10-CM | POA: Diagnosis not present

## 2017-10-25 DIAGNOSIS — I110 Hypertensive heart disease with heart failure: Secondary | ICD-10-CM | POA: Diagnosis not present

## 2017-10-25 DIAGNOSIS — I5041 Acute combined systolic (congestive) and diastolic (congestive) heart failure: Secondary | ICD-10-CM | POA: Diagnosis not present

## 2017-10-26 DIAGNOSIS — F411 Generalized anxiety disorder: Secondary | ICD-10-CM | POA: Diagnosis not present

## 2017-10-26 DIAGNOSIS — L03119 Cellulitis of unspecified part of limb: Secondary | ICD-10-CM | POA: Diagnosis not present

## 2017-10-26 DIAGNOSIS — I480 Paroxysmal atrial fibrillation: Secondary | ICD-10-CM | POA: Diagnosis not present

## 2017-10-26 DIAGNOSIS — F322 Major depressive disorder, single episode, severe without psychotic features: Secondary | ICD-10-CM | POA: Diagnosis not present

## 2017-10-26 DIAGNOSIS — I739 Peripheral vascular disease, unspecified: Secondary | ICD-10-CM | POA: Diagnosis not present

## 2017-10-26 DIAGNOSIS — I10 Essential (primary) hypertension: Secondary | ICD-10-CM | POA: Diagnosis not present

## 2017-10-26 DIAGNOSIS — M549 Dorsalgia, unspecified: Secondary | ICD-10-CM | POA: Diagnosis not present

## 2017-11-20 ENCOUNTER — Ambulatory Visit: Payer: Medicare HMO | Admitting: Infectious Diseases

## 2017-11-20 DIAGNOSIS — I10 Essential (primary) hypertension: Secondary | ICD-10-CM | POA: Diagnosis not present

## 2017-11-20 DIAGNOSIS — M5136 Other intervertebral disc degeneration, lumbar region: Secondary | ICD-10-CM | POA: Diagnosis not present

## 2017-11-20 DIAGNOSIS — M5416 Radiculopathy, lumbar region: Secondary | ICD-10-CM | POA: Diagnosis not present

## 2017-11-20 DIAGNOSIS — M5126 Other intervertebral disc displacement, lumbar region: Secondary | ICD-10-CM | POA: Diagnosis not present

## 2017-12-05 DIAGNOSIS — M545 Low back pain: Secondary | ICD-10-CM | POA: Diagnosis not present

## 2017-12-05 DIAGNOSIS — M4696 Unspecified inflammatory spondylopathy, lumbar region: Secondary | ICD-10-CM | POA: Diagnosis not present

## 2017-12-11 DIAGNOSIS — G4733 Obstructive sleep apnea (adult) (pediatric): Secondary | ICD-10-CM | POA: Diagnosis not present

## 2017-12-18 DIAGNOSIS — M5416 Radiculopathy, lumbar region: Secondary | ICD-10-CM | POA: Diagnosis not present

## 2017-12-18 DIAGNOSIS — M461 Sacroiliitis, not elsewhere classified: Secondary | ICD-10-CM | POA: Diagnosis not present

## 2017-12-18 DIAGNOSIS — M5136 Other intervertebral disc degeneration, lumbar region: Secondary | ICD-10-CM | POA: Diagnosis not present

## 2017-12-18 DIAGNOSIS — M5126 Other intervertebral disc displacement, lumbar region: Secondary | ICD-10-CM | POA: Diagnosis not present

## 2017-12-27 DIAGNOSIS — M47816 Spondylosis without myelopathy or radiculopathy, lumbar region: Secondary | ICD-10-CM | POA: Diagnosis not present

## 2017-12-29 ENCOUNTER — Other Ambulatory Visit: Payer: Medicare HMO

## 2018-01-01 ENCOUNTER — Encounter: Payer: Self-pay | Admitting: Physician Assistant

## 2018-01-01 ENCOUNTER — Ambulatory Visit (INDEPENDENT_AMBULATORY_CARE_PROVIDER_SITE_OTHER): Payer: Medicare HMO | Admitting: Physician Assistant

## 2018-01-01 VITALS — BP 120/58 | HR 52 | Ht 61.0 in | Wt 172.8 lb

## 2018-01-01 DIAGNOSIS — E782 Mixed hyperlipidemia: Secondary | ICD-10-CM | POA: Diagnosis not present

## 2018-01-01 DIAGNOSIS — I25700 Atherosclerosis of coronary artery bypass graft(s), unspecified, with unstable angina pectoris: Secondary | ICD-10-CM | POA: Diagnosis not present

## 2018-01-01 DIAGNOSIS — I480 Paroxysmal atrial fibrillation: Secondary | ICD-10-CM

## 2018-01-01 DIAGNOSIS — I10 Essential (primary) hypertension: Secondary | ICD-10-CM

## 2018-01-01 DIAGNOSIS — I200 Unstable angina: Secondary | ICD-10-CM | POA: Diagnosis not present

## 2018-01-01 MED ORDER — NITROGLYCERIN 0.4 MG SL SUBL: 0.4000 mg | SUBLINGUAL_TABLET | SUBLINGUAL | 3 refills | Status: DC | PRN

## 2018-01-01 MED ORDER — ALBUTEROL SULFATE HFA 108 (90 BASE) MCG/ACT IN AERS: 2.0000 | INHALATION_SPRAY | Freq: Four times a day (QID) | RESPIRATORY_TRACT | 1 refills | Status: AC | PRN

## 2018-01-01 MED ORDER — FUROSEMIDE 20 MG PO TABS: 20.0000 mg | ORAL_TABLET | Freq: Every day | ORAL | 3 refills | Status: DC | PRN

## 2018-01-01 NOTE — Patient Instructions (Addendum)
Medication Instructions:   Your physician recommends that you continue on your current medications as directed. Please refer to the Current Medication list given to you today.  If you need a refill on your cardiac medications before your next appointment, please call your pharmacy.  Labwork: NONE ORDERED  TODAY     Testing/Procedures: NONE ORDERED  TODAY    Follow-Up: Your physician wants you to follow-up in:  IN 6  MONTHS WITH DR NAHSER  You will receive a reminder letter in the mail two months in advance. If you don't receive a letter, please call our office to schedule the follow-up appointment.       Any Other Special Instructions Will Be Listed Below (If Applicable).                                                                                                                                                   

## 2018-01-01 NOTE — Progress Notes (Signed)
Cardiology Office Note    Date:  01/01/2018   ID:  OCIA SIMEK, DOB June 07, 1950, MRN [id number]  PCP:  Josetta Huddle, MD  Cardiologist:  Dr. [name]   Chief Complaint: 6 Months follow up  History of Present Illness:   Wendy [name] is a 67 y.o. female with a hx of CAD with CABG,  PAF has been maintaining SR/SB on eliquis, HTN, HLD and COPD presents for follow up.   Hx of CAD s/p CABG in '98 with an LIMA-LAD, SVG-OM, and SVG-RCA. Post op course complicated by post op MI secondary to native RCA occlusion. She then had a PCI to the [facility] Feb 2012, PCI again SVG-RCA 2013, and 2016.  Continue DAPT indefinitely.   By 07/2016 the VG to RCA was totally occluded prox.  Last Cath done 07/25/16 showed occluded grafts to the RCA and OM1 with a 50% native LAD (ulcerative). She had L-R collaterals. Her EF at cath was initially read as depressed but later re evaluated and felt to be more consistent with the echo finding- EF 45-50%. It was noted that she was on 120 mg of Imdur BID prior to admission. She was pain free on low dose nitro paste in the hospital. It was felt she may have developed some resistance to nitrates secondary to a high chronic BID dose and this was decreased at discharge.   She was last seen when admitted 08/2017 with revision of right hip. Her Renexa discontinued and Eliquis changed to coumadin due to interaction with Rifampin. She completed Rifampin 10/11/17 and then switched back to Eliquis 5mg  BID.   Here today for follow up.  Patient states that he has stable pattern of angina since seen by Dr. [name].  She takes nitroglycerin sometimes few days in a row and then did not require for few weeks.  Random and very intermittent symptoms.  She walks with grocery cart without chest pain.  No regular exercise.  She has chronic dyspnea on exertion due to COPD.  Used to see pulmonary but has not seen since moved to [place] few years ago.  Declined referral.  She has seen vascular doctor  previously and diagnosed with PAD but no testing on our system.  Declined today.  She takes Lasix as needed for intermittent lower extremity edema.  Denies orthopnea, PND, syncope, palpitation or melena.  Past Medical History:  Diagnosis Date  . Anemia   . Anginal pain ([place])    jaw pain 07/2016, s/p 07/15/16 nuclear stress test  . Anxiety   . Arthritis    "lower back" (08/24/2017)  . CAD (coronary artery disease), native coronary artery    a. Hx CABG 1998, last PCI 2013 b.LHC 07/2013:  EF 55-60%, L-LAD prob atretic, no sig disease in LAD, S-OM1 ok with patent stent, S-dRCA ok => med Rx. c. LHC 01/2015 DES to SVG-RCA.  Marland Kitchen Chronic back pain   . Chronic leg pain   . Chronic lower back pain   . COPD (chronic obstructive pulmonary disease) ([place])   . Depression   . DVT (deep venous thrombosis) ([facility]) X 3   RLE  . Fall 08/2016  . GERD (gastroesophageal reflux disease)   . Glucose intolerance (impaired glucose tolerance)   . Headache    "a few/month" (08/24/2017)  . HLD (hyperlipidemia)   . Hypertension   . Myocardial infarction ([place]) 1999  . OSA on CPAP   . PAD (peripheral artery disease) ([place])   . PAF (paroxysmal atrial fibrillation) ([place])  a. 09/2013 post-op b. 01/2015  . Peripheral neuropathy   . Shortness of breath    when walking  . Wears glasses     Past Surgical History:  Procedure Laterality Date  . ANTERIOR CERVICAL DECOMP/DISCECTOMY FUSION  1991  . BACK SURGERY    . CARDIAC CATHETERIZATION N/A 01/22/2015   Procedure: Left Heart Cath and Coronary Angiography;  Surgeon: Troy Sine, MD;  Location: [facility];  Service: Cardiovascular;  Laterality: N/A;  . CARDIAC CATHETERIZATION N/A 01/22/2015   Procedure: Coronary Stent Intervention;  Surgeon: Troy Sine, MD;  Location: [facility];  Service: Cardiovascular;  Laterality: N/A;  3.0x12 xience prox SVG to RCA  . CARDIAC CATHETERIZATION  03/09/2000   hx/notes 03/20/2000  . CHOLECYSTECTOMY N/A 09/16/2013    Procedure: LAPAROSCOPIC CHOLECYSTECTOMY CONVERTED TO OPEN CHOLECYSTECTOMY WITH CHOLANGIOGRAM;  Surgeon: [name], MD;  Location: [facility];  Service: General;  Laterality: N/A;  . CORONARY ANGIOPLASTY WITH STENT PLACEMENT  11/2011, 02/2012, 01/2015   DES to SVG-RCA both times, In [place], [place], Dr. Bruce Donath  . CORONARY ARTERY BYPASS GRAFT  1998   LIMA-LAD, SVG-OM, SVG-RCA  . ERCP N/A 09/19/2013   Procedure: ENDOSCOPIC RETROGRADE CHOLANGIOPANCREATOGRAPHY (ERCP);  Surgeon: [name], MD;  Location: [facility];  Service: Endoscopy;  Laterality: N/A;  . FINGER SURGERY Right    ring finger "fused"  . FRACTURE SURGERY    . IRRIGATION AND DEBRIDEMENT ABSCESS Right 10/17/2013   Procedure: INCISION AND DRAINAGE RIGHT SUBCOSTAL WOUND;  Surgeon: [name], MD;  Location: [facility];  Service: General;  Laterality: Right;  . JOINT REPLACEMENT    . KNEE ARTHROSCOPY Right 1998  . LEFT HEART CATH AND CORS/GRAFTS ANGIOGRAPHY N/A 07/25/2016   Procedure: Left Heart Cath and Cors/Grafts Angiography;  Surgeon: Jettie Booze, MD;  Location: [facility];  Service: Cardiovascular;  Laterality: N/A;  . LEFT HEART CATHETERIZATION WITH CORONARY/GRAFT ANGIOGRAM N/A 07/19/2013   Procedure: LEFT HEART CATHETERIZATION WITH Beatrix Fetters;  Surgeon: Leonie Man, MD;  Location: [facility];  Service: Cardiovascular;  Laterality: N/A;  . MASS EXCISION Right 06/25/2014   Procedure: EXCISION BUTTOCK MASS ;  Surgeon: Coralie Keens, MD;  Location: [facility];  Service: General;  Laterality: Right;  . ORIF HUMERUS FRACTURE Left 08/27/2013   Procedure: OPEN REDUCTION INTERNAL FIXATION (ORIF) LEFT HUMERUS ;  Surgeon: Rozanna Box, MD;  Location: [facility];  Service: Orthopedics;  Laterality: Left;  . POSTERIOR FUSION CERVICAL SPINE  1992  . THROMBECTOMY / EMBOLECTOMY FEMORAL ARTERY Right    hx/notes 03/20/2000  . TOTAL HIP ARTHROPLASTY Right 07/19/2016   Procedure: TOTAL HIP  ARTHROPLASTY ANTERIOR APPROACH;  Surgeon: [name], MD;  Location: [facility];  Service: Orthopedics;  Laterality: Right;  . TOTAL HIP REVISION Right 08/22/2017   Procedure: TOTAL HIP REVISION;  Surgeon: [name], MD;  Location: [facility];  Service: Orthopedics;  Laterality: Right;    Current Medications:  Prior to Admission medications   Medication Sig Start Date End Date Taking? Authorizing Provider  albuterol (PROVENTIL HFA;VENTOLIN HFA) 108 (90 BASE) MCG/ACT inhaler Inhale 2 puffs into the lungs every 6 (six) hours as needed for wheezing or shortness of breath.   Yes [provider]  ALPRAZolam Duanne Moron) 0.5 MG tablet Take 0.5 mg by mouth 3 (three) times daily as needed for anxiety.   Yes [provider]  apixaban (ELIQUIS) 5 MG TABS tablet Take 1 tablet (5 mg total)  by mouth 2 (two) times daily. 10/17/17  Yes Nahser, Wonda Cheng, MD  atorvastatin (LIPITOR) 80 MG tablet Take 1 tablet (80 mg total) by mouth daily at 6 PM. 07/03/17  Yes Nahser, Wonda Cheng, MD  buPROPion (WELLBUTRIN XL) 150 MG 24 hr tablet Take 150 mg by mouth daily.   Yes [provider]  clopidogrel (PLAVIX) 75 MG tablet Take 75 mg by mouth daily.   Yes [provider]  diclofenac sodium (VOLTAREN) 1 % GEL Apply 1 g topically daily as needed (pain).   Yes [provider]  docusate sodium (COLACE) 100 MG capsule Take 1 capsule (100 mg total) by mouth 2 (two) times daily. To prevent constipation while taking pain medication. 08/22/17  Yes Prudencio Burly III, PA-C  ezetimibe (ZETIA) 10 MG tablet Take 10 mg by mouth daily.   Yes [provider]  isosorbide mononitrate (IMDUR) 60 MG 24 hr tablet Take 1 tablet (60 mg total) by mouth every morning. 07/03/17 07/03/18 Yes Nahser, Wonda Cheng, MD  metoprolol tartrate (LOPRESSOR) 50 MG tablet Take 1 tablet (50 mg total) by mouth 2 (two) times daily. 07/03/17  Yes Nahser, Wonda Cheng, MD  nitroGLYCERIN (NITROSTAT) 0.4 MG SL tablet  DISSOLVE 1 TAB UNDER TONGUE FOR CHEST PAIN - IF PAIN REMAINS AFTER 5 MIN, CALL 911 AND REPEAT DOSE. MAX 3 TABS IN 15 MINUTES 10/02/17  Yes Nahser, Wonda Cheng, MD  oxyCODONE (OXY IR/ROXICODONE) 5 MG immediate release tablet Take 1 tablet by mouth as needed. 12/25/17  Yes [provider]  pantoprazole (PROTONIX) 40 MG tablet Take 1 tablet (40 mg total) daily by mouth. 01/16/17  Yes Nahser, Wonda Cheng, MD  ranolazine (RANEXA) 1000 MG SR tablet Take 1,000 mg by mouth 2 (two) times daily.   Yes [provider]  sertraline (ZOLOFT) 100 MG tablet Take 150 mg by mouth daily.    Yes [provider]    Allergies:   Benadryl [diphenhydramine]; Adhesive [tape]; Amoxicillin; Cyclobenzaprine; and Hydrocodone   Social History   Socioeconomic History  . Marital status: Divorced    Spouse name: Not on file  . Number of children: 5  . Years of education: Not on file  . Highest education level: Not on file  Occupational History  . Occupation: Disabled  Social Needs  . Financial resource strain: Not on file  . Food insecurity:    Worry: Not on file    Inability: Not on file  . Transportation needs:    Medical: Not on file    Non-medical: Not on file  Tobacco Use  . Smoking status: Former Smoker    Packs/day: 0.50    Years: 34.00    Pack years: 17.00    Types: Cigarettes    Last attempt to quit: 07/20/1998    Years since quitting: 19.4  . Smokeless tobacco: Never Used  Substance and Sexual Activity  . Alcohol use: Not Currently  . Drug use: Not Currently    Types: Marijuana  . Sexual activity: Not Currently  Lifestyle  . Physical activity:    Days per week: Not on file    Minutes per session: Not on file  . Stress: Not on file  Relationships  . Social connections:    Talks on phone: Not on file    Gets together: Not on file    Attends religious service: Not on file    Active member of club or organization: Not on file    Attends meetings of clubs or  organizations:  Not on file    Relationship status: Not on file  Other Topics Concern  . Not on file  Social History Narrative   Lives with best friend.      Family History:  The patient's family history includes Clotting disorder in her father; Heart disease in her brother and brother; Lung cancer in her mother and sister. ROS:   Please see the history of present illness.    ROS All other systems reviewed and are negative.   PHYSICAL EXAM:   VS:  BP (!) 120/58   Pulse (!) 52   Ht 5\' 1"  (1.549 m)   Wt 172 lb 12.8 oz (78.4 kg)   SpO2 91%   BMI 32.65 kg/m    GEN: Well nourished, well developed, in no acute distress  HEENT: normal  Neck: no JVD, carotid bruits, or masses Cardiac: RRR; no murmurs, rubs, or gallops,no edema  Respiratory:  clear to auscultation bilaterally, normal work of breathing GI: soft, nontender, nondistended, + BS MS: no deformity or atrophy  Skin: warm and dry, no rash Neuro:  Alert and Oriented x 3, Strength and sensation are intact Psych: euthymic mood, full affect  Wt Readings from Last 3 Encounters:  01/01/18 172 lb 12.8 oz (78.4 kg)  10/02/17 165 lb (74.8 kg)  08/22/17 164 lb (74.4 kg)      Studies/Labs Reviewed:   EKG:  EKG is ordered today.  The ekg ordered today demonstrates sinus bradycardia at rate of 52 bpm and T wave inversion in lateral lead which is improved from prior EKG  Recent Labs: 08/25/2017: BUN <5; Creatinine, Ser 0.61; Potassium 3.6; Sodium 145 09/07/2017: Hemoglobin 11.8; Platelets 289 10/02/2017: ALT 17   Lipid Panel    Component Value Date/Time   CHOL 98 (L) 12/05/2016 1139   TRIG 73 12/05/2016 1139   HDL 43 12/05/2016 1139   CHOLHDL 2.3 12/05/2016 1139   CHOLHDL 4.1 08/13/2015 0939   VLDL 48 (H) 08/13/2015 0939   LDLCALC 40 12/05/2016 1139    Additional studies/ records that were reviewed today include:   Echocardiogram: 07/2016 Study Conclusions  - Left ventricle: The cavity size was normal. Wall thickness was   normal.  Systolic function was normal. The estimated ejection   fraction was in the range of 60% to 65%. Wall motion was normal;   there were no regional wall motion abnormalities. The study is   not technically sufficient to allow evaluation of LV diastolic   function. - Aortic valve: Mildly calcified annulus. Trileaflet. - Mitral valve: Calcified annulus. There was trivial regurgitation. - Right atrium: Central venous pressure (est): 3 mm Hg. - Tricuspid valve: There was trivial regurgitation. - Pulmonary arteries: PA peak pressure: 24 mm Hg (S). - Pericardium, extracardiac: There was no pericardial effusion.  Impressions:  - Normal LV wall thickness with LVEF 60-65%. Indeterminate   diastolic function. Mitral annular calcification with trivial   mitral regurgitation. Trivial tricuspid regurgitation with PASP   24 mmHg.  Cardiac Catheterization:  07/2016 Left Heart Cath and Cors/Grafts Angiography  Conclusion     Severe three-vessel coronary artery disease.  Unable to gain access from the left common femoral. It appears there is a left to right fem-fem graft based on the wire course after needle access to the vessel.  Occluded SVG to marginal, known from prior. LIMA to LAD known to be atretic from prior cath.  SVG to PDA occluded. This is a new finding.  Mid LAD lesion, 50 %stenosed. This  is an ulcerated, irregular lesion. There are left to right collaterals.  The left ventricular ejection fraction is 25-35% by visual estimate.  There is moderate to severe left ventricular systolic dysfunction.  LV end diastolic pressure is moderately elevated.  There is no aortic valve stenosis.  Of note, poor radial pulses bilaterally. Right brachial artery was accessed using ultrasound guidance.   I suspect her troponin elevation is from heart failure. It does not have a typical ACS pattern. I don't think her SVG to RCA occlusion is acute.  Continue medical therapy for heart  failure.  Consider viability study. If inferior wall is viable, could consider cardiac surgery consultation for repeat CABG.    ASSESSMENT & PLAN:    1. CAD status post CABG Last cath 07/2016 above.  He has stable pattern of angina for many months.  EKG without acute changes.  Takes nitroglycerin as needed.  She declined any testing as stable pattern of angina.  She knows when to call us.  Continue Plavix, statin, BB, Imdur and Ranexa.  2.  Paroxysmal atrial fibrillation -Maintaining sinus rhythm on slow ventricular rate. Continue Eliquis for anticoagulation.  No bleeding issue.  3.  COPD -Takes as needed nebulizer and inhalers.  She has lost follow-up with pulmonary since moved to [place].  She declined referral.  She will discuss with PCP during office visit next month.  4.  Claudication -Chronic for many years.  No exacerbating symptoms.  Good pulse.  Declined testing today.  Continue Plavix and statin.  5. HLD - Continue statin  6. HTN - BP stable on current meds  Medication Adjustments/Labs and Tests Ordered: Current medicines are reviewed at length with the patient today.  Concerns regarding medicines are outlined above.  Medication changes, Labs and Tests ordered today are listed in the Patient Instructions below. Patient Instructions  Medication Instructions:   Your physician recommends that you continue on your current medications as directed. Please refer to the Current Medication list given to you today.    If you need a refill on your cardiac medications before your next appointment, please call your pharmacy.  Labwork: NONE ORDERED  TODAY    Testing/Procedures: NONE ORDERED  TODAY    Follow-Up: Your physician wants you to follow-up in:  IN [place] will receive a reminder letter in the mail two months in advance. If you don't receive a letter, please call our office to schedule the follow-up appointment.     Any Other Special  Instructions Will Be Listed Below (If Applicable).                                                                                                                                                      Jarrett Soho, [place]  01/01/2018 10:37 AM    [facility] [place], [place], [place]  [address] Phone: [phone]; Fax: [phone]

## 2018-01-04 ENCOUNTER — Other Ambulatory Visit: Payer: Medicare HMO

## 2018-01-10 ENCOUNTER — Ambulatory Visit
Admission: RE | Admit: 2018-01-10 | Discharge: 2018-01-10 | Disposition: A | Discharge status: Home / Self Care | Payer: Medicare HMO | Source: Ambulatory Visit | Attending: Internal Medicine | Admitting: Internal Medicine

## 2018-01-10 ENCOUNTER — Other Ambulatory Visit: Payer: Self-pay | Admitting: Internal Medicine

## 2018-01-10 DIAGNOSIS — R928 Other abnormal and inconclusive findings on diagnostic imaging of breast: Secondary | ICD-10-CM

## 2018-01-10 DIAGNOSIS — N6489 Other specified disorders of breast: Secondary | ICD-10-CM | POA: Diagnosis not present

## 2018-01-10 DIAGNOSIS — N63 Unspecified lump in unspecified breast: Secondary | ICD-10-CM

## 2018-01-10 DIAGNOSIS — R922 Inconclusive mammogram: Secondary | ICD-10-CM | POA: Diagnosis not present

## 2018-01-16 DIAGNOSIS — M47816 Spondylosis without myelopathy or radiculopathy, lumbar region: Secondary | ICD-10-CM | POA: Diagnosis not present

## 2018-01-17 ENCOUNTER — Other Ambulatory Visit: Payer: Self-pay | Admitting: Cardiovascular Disease

## 2018-01-17 NOTE — Telephone Encounter (Signed)
Okay to continue to refill? Please advise. Thanks, MI 

## 2018-01-23 DIAGNOSIS — I10 Essential (primary) hypertension: Secondary | ICD-10-CM | POA: Diagnosis not present

## 2018-01-23 DIAGNOSIS — I25810 Atherosclerosis of coronary artery bypass graft(s) without angina pectoris: Secondary | ICD-10-CM | POA: Diagnosis not present

## 2018-01-23 DIAGNOSIS — I480 Paroxysmal atrial fibrillation: Secondary | ICD-10-CM | POA: Diagnosis not present

## 2018-01-23 DIAGNOSIS — M549 Dorsalgia, unspecified: Secondary | ICD-10-CM | POA: Diagnosis not present

## 2018-01-23 DIAGNOSIS — J449 Chronic obstructive pulmonary disease, unspecified: Secondary | ICD-10-CM | POA: Diagnosis not present

## 2018-01-23 DIAGNOSIS — I739 Peripheral vascular disease, unspecified: Secondary | ICD-10-CM | POA: Diagnosis not present

## 2018-01-23 DIAGNOSIS — F322 Major depressive disorder, single episode, severe without psychotic features: Secondary | ICD-10-CM | POA: Diagnosis not present

## 2018-01-23 DIAGNOSIS — F411 Generalized anxiety disorder: Secondary | ICD-10-CM | POA: Diagnosis not present

## 2018-03-06 ENCOUNTER — Other Ambulatory Visit: Payer: Self-pay | Admitting: Cardiovascular Disease

## 2018-03-14 DIAGNOSIS — G4733 Obstructive sleep apnea (adult) (pediatric): Secondary | ICD-10-CM | POA: Diagnosis not present

## 2018-03-19 DIAGNOSIS — R69 Illness, unspecified: Secondary | ICD-10-CM | POA: Diagnosis not present

## 2018-03-22 DIAGNOSIS — R69 Illness, unspecified: Secondary | ICD-10-CM | POA: Diagnosis not present

## 2018-04-03 ENCOUNTER — Telehealth: Payer: Self-pay | Admitting: *Deleted

## 2018-04-03 DIAGNOSIS — M461 Sacroiliitis, not elsewhere classified: Secondary | ICD-10-CM | POA: Diagnosis not present

## 2018-04-03 DIAGNOSIS — M5136 Other intervertebral disc degeneration, lumbar region: Secondary | ICD-10-CM | POA: Diagnosis not present

## 2018-04-03 DIAGNOSIS — M47816 Spondylosis without myelopathy or radiculopathy, lumbar region: Secondary | ICD-10-CM | POA: Diagnosis not present

## 2018-04-03 DIAGNOSIS — M5416 Radiculopathy, lumbar region: Secondary | ICD-10-CM | POA: Diagnosis not present

## 2018-04-03 NOTE — Telephone Encounter (Signed)
Pt takes Eliquis for afib with CHADS2VASc score of 5 (age, sex, CHF, HTN, CAD/PAD).   PMH from October 2019 note includes DVT x3, however this is not included in her [place] in her April 2019 office visit. Looks as though this was added to her PMH during her June 2019 hospitalization, however we do not have any imaging studies to suggest she had DVTs during that time.  Please clarify with patient when she had her multiple DVTs. This will affect whether or not she can hold her Eliquis for 3 days prior to spinal injection.

## 2018-04-03 NOTE — Telephone Encounter (Signed)
   [facility] Pre-operative Risk Assessment    Request for surgical clearance:  1. What type of surgery is being performed? INJECTION   2. When is this surgery scheduled? 04/17/18   3. What type of clearance is required (medical clearance vs. Pharmacy clearance to hold med vs. Both)? BOTH  4. Are there any medications that need to be held prior to surgery and how long? 1. HOLD ELIQUIS X 3 DAYS; 2. HOLD PLAVIX X 7 DAYS   5. Practice name and name of physician performing surgery? [facility]; DR. BARTKO   6. What is your office phone number  [phone]   7.   What is your office fax number [phone]  8.   Anesthesia type (None, local, MAC, general) ? LEFT MESSAGE TO VERIFY IF ANY ANESTHESIA    Julaine Hua 04/03/2018, 3:13 PM  _________________________________________________________________   (provider comments below)

## 2018-04-05 NOTE — Telephone Encounter (Signed)
Follow up   Per Fabio Pierce is calling to advise Local anesethia only 1% [place] per [facility].

## 2018-04-11 NOTE — Telephone Encounter (Signed)
   Primary Cardiologist: [name], MD  Chart reviewed as part of pre-operative protocol coverage. Patient was contacted 04/11/2018 in reference to pre-operative risk assessment for pending surgery as outlined below.  Wendy [name] was last seen on 01/01/18 by B. Curly Shores, PA.  Since that day, Wendy [name] has done well with no chest pain or SOB.  She may hold plavix 7 days prior to procedure and hold Eliquis 3 days prior to procedure and resume post op as soon as possible.    Therefore, based on ACC/AHA guidelines, the patient would be at acceptable risk for the planned procedure without further cardiovascular testing.   I will route this recommendation to the requesting party via Epic fax function and remove from pre-op pool.  Please call with questions.  Cecilie Kicks, NP 04/11/2018, 10:35 AM

## 2018-04-11 NOTE — Telephone Encounter (Signed)
Pt stated it has been 10 years or so since DVT.  None in 2019.  Thanks.

## 2018-04-11 NOTE — Telephone Encounter (Signed)
Dr. [name],  [facility] to hold Plavix for 7 days for spinal injection?  I know she has significant disease so wanted to check thanks.  Just send results to pre-op pool.

## 2018-04-11 NOTE — Telephone Encounter (Signed)
Ok to hold Eliquis for 3 days prior to spinal procedure.

## 2018-04-11 NOTE — Telephone Encounter (Signed)
OK to hold Plavix for 7 days prior to spinal injection

## 2018-04-17 DIAGNOSIS — M47816 Spondylosis without myelopathy or radiculopathy, lumbar region: Secondary | ICD-10-CM | POA: Diagnosis not present

## 2018-04-24 DIAGNOSIS — M47816 Spondylosis without myelopathy or radiculopathy, lumbar region: Secondary | ICD-10-CM | POA: Diagnosis not present

## 2018-04-25 DIAGNOSIS — M549 Dorsalgia, unspecified: Secondary | ICD-10-CM | POA: Diagnosis not present

## 2018-04-25 DIAGNOSIS — F322 Major depressive disorder, single episode, severe without psychotic features: Secondary | ICD-10-CM | POA: Diagnosis not present

## 2018-04-25 DIAGNOSIS — I10 Essential (primary) hypertension: Secondary | ICD-10-CM | POA: Diagnosis not present

## 2018-04-25 DIAGNOSIS — J449 Chronic obstructive pulmonary disease, unspecified: Secondary | ICD-10-CM | POA: Diagnosis not present

## 2018-04-25 DIAGNOSIS — I25810 Atherosclerosis of coronary artery bypass graft(s) without angina pectoris: Secondary | ICD-10-CM | POA: Diagnosis not present

## 2018-04-25 DIAGNOSIS — I739 Peripheral vascular disease, unspecified: Secondary | ICD-10-CM | POA: Diagnosis not present

## 2018-04-25 DIAGNOSIS — E785 Hyperlipidemia, unspecified: Secondary | ICD-10-CM | POA: Diagnosis not present

## 2018-04-25 DIAGNOSIS — L309 Dermatitis, unspecified: Secondary | ICD-10-CM | POA: Diagnosis not present

## 2018-04-25 DIAGNOSIS — E049 Nontoxic goiter, unspecified: Secondary | ICD-10-CM | POA: Diagnosis not present

## 2018-04-26 ENCOUNTER — Other Ambulatory Visit: Payer: Self-pay | Admitting: Internal Medicine

## 2018-04-26 DIAGNOSIS — E049 Nontoxic goiter, unspecified: Secondary | ICD-10-CM

## 2018-04-26 DIAGNOSIS — R1310 Dysphagia, unspecified: Secondary | ICD-10-CM

## 2018-04-30 DIAGNOSIS — R69 Illness, unspecified: Secondary | ICD-10-CM | POA: Diagnosis not present

## 2018-05-09 ENCOUNTER — Encounter: Payer: Self-pay | Admitting: Radiology

## 2018-05-09 ENCOUNTER — Other Ambulatory Visit: Payer: Medicare HMO

## 2018-05-24 DIAGNOSIS — M461 Sacroiliitis, not elsewhere classified: Secondary | ICD-10-CM | POA: Diagnosis not present

## 2018-05-24 DIAGNOSIS — M47816 Spondylosis without myelopathy or radiculopathy, lumbar region: Secondary | ICD-10-CM | POA: Diagnosis not present

## 2018-05-24 DIAGNOSIS — Z6831 Body mass index (BMI) 31.0-31.9, adult: Secondary | ICD-10-CM | POA: Diagnosis not present

## 2018-05-28 ENCOUNTER — Telehealth: Payer: Self-pay | Admitting: *Deleted

## 2018-05-28 NOTE — Telephone Encounter (Signed)
Pt takes Eliquis for afib with CHADS2VASc score of 5 (age, sex, CHF, HTN, CAD/PAD), also with DVT > 10 years ago. Agree - ok to hold Eliquis 3 days prior.

## 2018-05-28 NOTE — Telephone Encounter (Signed)
LOOKS LIKE THIS IS A NEW REQUEST FOR ANOTHER INJECTION.     [facility] Pre-operative Risk Assessment    Request for surgical clearance:  1. What type of surgery is being performed? LUMBAR SPINE S1 JOINT   2. When is this surgery scheduled? TBD   3. What type of clearance is required (medical clearance vs. Pharmacy clearance to hold med vs. Both)? BOTH  4. Are there any medications that need to be held prior to surgery and how long?PLAVIX  X 7 DAYS PRIOR AND ELIQUIS X 3 DAYS PRIOR  5. Practice name and name of physician performing surgery? [facility]; DR. BARTKO   6. What is your office phone number [phone]    7.   What is your office fax number [phone]  8.   Anesthesia type (None, local, MAC, general) ? NONE LISTED    Wendy [name] 05/28/2018, 8:59 AM  _________________________________________________________________   (provider comments below)

## 2018-05-28 NOTE — Telephone Encounter (Signed)
   Primary Cardiologist: [name], MD  Chart reviewed as part of pre-operative protocol coverage. Patient was contacted 05/28/2018 in reference to pre-operative risk assessment for pending surgery as outlined below.  Wendy [name] was last seen on 01/01/2018 by B. Curly Shores, PA.  Since that day, Wendy [name] has done well with no chest pain or shortness of breath. She had an injection in February and did well. As before she may hold Plavix for 7 days and Eliquis for 3 days prior to the procedure. Resume these meds post procedure as soon as possible.  Therefore, based on ACC/AHA guidelines, the patient would be at acceptable risk for the planned procedure without further cardiovascular testing.   I will route this recommendation to the requesting party via Epic fax function and remove from pre-op pool.  Please call with questions.  Daune Perch, NP 05/28/2018, 9:37 AM

## 2018-05-30 ENCOUNTER — Other Ambulatory Visit: Payer: Self-pay | Admitting: Cardiovascular Disease

## 2018-06-14 ENCOUNTER — Telehealth: Payer: Self-pay

## 2018-06-14 DIAGNOSIS — G4733 Obstructive sleep apnea (adult) (pediatric): Secondary | ICD-10-CM | POA: Diagnosis not present

## 2018-06-14 NOTE — Telephone Encounter (Signed)
Spoke with pt and she gave consent for a telephone visit with Dr. [name] on 07/02/2018 at 4:20pm. Pt has been advised to have BP, HR, and weight ready for appt.    YOUR CARDIOLOGY TEAM HAS ARRANGED FOR AN E-VISIT FOR YOUR APPOINTMENT - PLEASE REVIEW IMPORTANT INFORMATION BELOW SEVERAL DAYS PRIOR TO YOUR APPOINTMENT  Due to the recent COVID-19 pandemic, we are transitioning in-person office visits to tele-medicine visits in an effort to decrease unnecessary exposure to our patients and staff. Medicare and most insurances are covering these visits without a copay needed. You will need a working email and a smartphone or computer with a camera and microphone. For patients that do not have these items, we can still complete the visit using a telephone but do prefer video when possible. If possible, we also ask that you have a blood pressure cuff and scale at home to measure your blood pressure, heart rate and weight prior to your scheduled appointment. Patients with clinical needs that need an in-person evaluation and testing will still be able to come to the office if absolutely necessary. If you have any questions, feel free to call our office.     DOWNLOADING THE SOFTWARE  Download the [employer] app to enable video and telephone visits with your [facility] Provider.   Instructions for downloading [employer] WebEx: - Go to https://www.webex.com/downloads.html and follow the instructions, or download the app on your smartphone [facility] [employer] Meetings). - If you have technical difficulties with downloading WebEx, please call WebEx at [phone]. - Once the app is downloaded (can be done on either mobile or desktop computer), go to Settings in the upper left hand corner.  Be sure that camera and audio are enabled.  - You will receive an email message with a link to the meeting with a time to join for your tele-health visit.  - Please download the app and have settings configured prior to the  appointment time.      2-3 DAYS BEFORE YOUR APPOINTMENT  One of our staff will call you to confirm that you have been able to set up your WebEx account. We will remind you check your blood pressure, heart rate and weight prior to your scheduled appointment. If you have an Apple Watch or Kardia, please upload any pertinent ECG strips the day before or morning of your appointment to [place]. Our staff will also make sure you have reviewed the consent and agree to move forward with your scheduled tele-health visit.    THE DAY OF YOUR APPOINTMENT  Approximately 15-20 minutes prior to your scheduled appointment, you will receive an e-mail directly from one of our staff member's [email or web address] e-mail accounts inviting you to join a WebEx meeting.  Please do not reply to that email - simply join the [employer].  Upon joining, a member of the office staff will speak with you initially through the WebEx platform to confirm medications, vital signs for the day and any symptoms you may be experiencing.  Please have this information available prior to the time of visit start.      CONSENT FOR TELE-HEALTH VISIT - PLEASE RVIEW  I hereby voluntarily request, consent and authorize [facility] and its employed or contracted physicians, physician assistants, nurse practitioners or other licensed health care professionals (the Practitioner), to provide me with telemedicine health care services (the "Services") as deemed necessary by the treating Practitioner. I acknowledge and consent to receive the Services by the Practitioner via telemedicine. I understand that  the telemedicine visit will involve communicating with the Practitioner through live audiovisual communication technology and the disclosure of certain medical information by electronic transmission. I acknowledge that I have been given the opportunity to request an in-person assessment or other available alternative prior to the telemedicine visit  and am voluntarily participating in the telemedicine visit.  I understand that I have the right to withhold or withdraw my consent to the use of telemedicine in the course of my care at any time, without affecting my right to future care or treatment, and that the Practitioner or I may terminate the telemedicine visit at any time. I understand that I have the right to inspect all information obtained and/or recorded in the course of the telemedicine visit and may receive copies of available information for a reasonable fee.  I understand that some of the potential risks of receiving the Services via telemedicine include:  Marland Kitchen Delay or interruption in medical evaluation due to technological equipment failure or disruption; . Information transmitted may not be sufficient (e.g. poor resolution of images) to allow for appropriate medical decision making by the Practitioner; and/or  . In rare instances, security protocols could fail, causing a breach of personal health information.  Furthermore, I acknowledge that it is my responsibility to provide information about my medical history, conditions and care that is complete and accurate to the best of my ability. I acknowledge that Practitioner's advice, recommendations, and/or decision may be based on factors not within their control, such as incomplete or inaccurate data provided by me or distortions of diagnostic images or specimens that may result from electronic transmissions. I understand that the practice of medicine is not an exact science and that Practitioner makes no warranties or guarantees regarding treatment outcomes. I acknowledge that I will receive a copy of this consent concurrently upon execution via email to the email address I last provided but may also request a printed copy by calling the office of [place].    I understand that my insurance will be billed for this visit.   I have read or had this consent read to me. . I understand  the contents of this consent, which adequately explains the benefits and risks of the Services being provided via telemedicine.  . I have been provided ample opportunity to ask questions regarding this consent and the Services and have had my questions answered to my satisfaction. . I give my informed consent for the services to be provided through the use of telemedicine in my medical care  By participating in this telemedicine visit I agree to the above.

## 2018-06-26 ENCOUNTER — Telehealth: Payer: Self-pay | Admitting: Cardiovascular Disease

## 2018-06-26 NOTE — Telephone Encounter (Signed)
Spoke with patient who confirmed all demographics. She has My Chart but cannot use it because her phone is not working properly. Will have vitals ready for visit.  She does not have a smart phone.

## 2018-07-02 ENCOUNTER — Other Ambulatory Visit: Payer: Self-pay

## 2018-07-02 ENCOUNTER — Telehealth (INDEPENDENT_AMBULATORY_CARE_PROVIDER_SITE_OTHER): Payer: Medicare HMO | Admitting: Cardiovascular Disease

## 2018-07-02 ENCOUNTER — Encounter: Payer: Self-pay | Admitting: Cardiovascular Disease

## 2018-07-02 VITALS — BP 126/60 | HR 54 | Ht 61.0 in | Wt 170.2 lb

## 2018-07-02 DIAGNOSIS — Z7189 Other specified counseling: Secondary | ICD-10-CM

## 2018-07-02 DIAGNOSIS — I2510 Atherosclerotic heart disease of native coronary artery without angina pectoris: Secondary | ICD-10-CM | POA: Diagnosis not present

## 2018-07-02 DIAGNOSIS — I480 Paroxysmal atrial fibrillation: Secondary | ICD-10-CM | POA: Diagnosis not present

## 2018-07-02 DIAGNOSIS — Z9861 Coronary angioplasty status: Secondary | ICD-10-CM | POA: Diagnosis not present

## 2018-07-02 DIAGNOSIS — I10 Essential (primary) hypertension: Secondary | ICD-10-CM | POA: Diagnosis not present

## 2018-07-02 DIAGNOSIS — Z951 Presence of aortocoronary bypass graft: Secondary | ICD-10-CM

## 2018-07-02 NOTE — Patient Instructions (Signed)

## 2018-07-02 NOTE — Progress Notes (Signed)
Virtual Visit via Telephone Note   This visit type was conducted due to national recommendations for restrictions regarding the COVID-19 Pandemic (e.g. social distancing) in an effort to limit this patient's exposure and mitigate transmission in our community.  Due to her co-morbid illnesses, this patient is at least at moderate risk for complications without adequate follow up.  This format is felt to be most appropriate for this patient at this time.  The patient did not have access to video technology/had technical difficulties with video requiring transitioning to audio format only (telephone).  All issues noted in this document were discussed and addressed.  No physical exam could be performed with this format.  Please refer to the patient's chart for her  consent to telehealth for [facility].   Evaluation Performed:  Follow-up visit  Date:  07/02/2018   ID:  Wendy [name], DOB 1950-12-27, MRN [id number]  Patient Location: Home Provider Location: Office  PCP:  Josetta Huddle, MD  Cardiologist:  [name], MD  Electrophysiologist:  None   1. Unstable angina (HCC) - history of CAD s/p CABG SVG-OM, LIMA-LAD, SVG-RCA in 1998, PCI in 2013. She underwent cardiac catheterization here in 07/2013 which showed an atretic LIMA-LAD with no significant disease in the native LAD, patent SVG-OM with patent stent and patent SVG-distRCA.  S/p PCI of native RCA ( within the stent )   2. Paroxysmal Atrial Fibrillation  CHADS2VASC = 4   ( female, age 68, CAD,HTN ) 3. Hypertension -    4. HLD (hyperlipidemia)  5. COPD (chronic obstructive pulmonary disease) (HCC)   Chief Complaint:  Shortness of breath   History of Present Illness:    Wendy [name] is a 68 y.o. female with CAD, CABG  Was seen by Vin in Oct. 2019  Has occasional episodes of angina Takes NTG on occasion  Had an infected right hip prosthesis Was on Rifampin  ( had to stop ranexa and her eliquis)  Is now back  on eliquis   Is not getting much exercise ( hip pain )     The patient does not have symptoms concerning for COVID-19 infection (fever, chills, cough, or new shortness of breath).    Past Medical History:  Diagnosis Date  . Anemia   . Anginal pain ([place])    jaw pain 07/2016, s/p 07/15/16 nuclear stress test  . Anxiety   . Arthritis    "lower back" (08/24/2017)  . CAD (coronary artery disease), native coronary artery    a. Hx CABG 1998, last PCI 2013 b.LHC 07/2013:  EF 55-60%, L-LAD prob atretic, no sig disease in LAD, S-OM1 ok with patent stent, S-dRCA ok => med Rx. c. LHC 01/2015 DES to SVG-RCA.  Marland Kitchen Chronic back pain   . Chronic leg pain   . Chronic lower back pain   . COPD (chronic obstructive pulmonary disease) ([place])   . Depression   . DVT (deep venous thrombosis) (HCC) X 3   RLE  . Fall 08/2016  . GERD (gastroesophageal reflux disease)   . Glucose intolerance (impaired glucose tolerance)   . Headache    "a few/month" (08/24/2017)  . HLD (hyperlipidemia)   . Hypertension   . Myocardial infarction ([place]) 1999  . OSA on CPAP   . PAD (peripheral artery disease) ([place])   . PAF (paroxysmal atrial fibrillation) ([place])    a. 09/2013 post-op b. 01/2015  . Peripheral neuropathy   . Shortness of breath    when walking  .  Wears glasses    Past Surgical History:  Procedure Laterality Date  . ANTERIOR CERVICAL DECOMP/DISCECTOMY FUSION  1991  . BACK SURGERY    . CARDIAC CATHETERIZATION N/A 01/22/2015   Procedure: Left Heart Cath and Coronary Angiography;  Surgeon: Troy Sine, MD;  Location: [facility];  Service: Cardiovascular;  Laterality: N/A;  . CARDIAC CATHETERIZATION N/A 01/22/2015   Procedure: Coronary Stent Intervention;  Surgeon: Troy Sine, MD;  Location: [place] [facility];  Service: Cardiovascular;  Laterality: N/A;  3.0x12 xience prox SVG to RCA  . CARDIAC CATHETERIZATION  03/09/2000   hx/notes 03/20/2000  . CHOLECYSTECTOMY N/A 09/16/2013   Procedure:  LAPAROSCOPIC CHOLECYSTECTOMY CONVERTED TO OPEN CHOLECYSTECTOMY WITH CHOLANGIOGRAM;  Surgeon: [name], MD;  Location: [facility];  Service: General;  Laterality: N/A;  . CORONARY ANGIOPLASTY WITH STENT PLACEMENT  11/2011, 02/2012, 01/2015   DES to SVG-RCA both times, In [place], [place], Dr. Bruce Donath  . CORONARY ARTERY BYPASS GRAFT  1998   LIMA-LAD, SVG-OM, SVG-RCA  . ERCP N/A 09/19/2013   Procedure: ENDOSCOPIC RETROGRADE CHOLANGIOPANCREATOGRAPHY (ERCP);  Surgeon: [name], MD;  Location: [place];  Service: Endoscopy;  Laterality: N/A;  . FINGER SURGERY Right    ring finger "fused"  . FRACTURE SURGERY    . IRRIGATION AND DEBRIDEMENT ABSCESS Right 10/17/2013   Procedure: INCISION AND DRAINAGE RIGHT SUBCOSTAL WOUND;  Surgeon: [name], MD;  Location: [facility];  Service: General;  Laterality: Right;  . JOINT REPLACEMENT    . KNEE ARTHROSCOPY Right 1998  . LEFT HEART CATH AND CORS/GRAFTS ANGIOGRAPHY N/A 07/25/2016   Procedure: Left Heart Cath and Cors/Grafts Angiography;  Surgeon: Jettie Booze, MD;  Location: [place] [facility];  Service: Cardiovascular;  Laterality: N/A;  . LEFT HEART CATHETERIZATION WITH CORONARY/GRAFT ANGIOGRAM N/A 07/19/2013   Procedure: LEFT HEART CATHETERIZATION WITH Beatrix Fetters;  Surgeon: Leonie Man, MD;  Location: [facility];  Service: Cardiovascular;  Laterality: N/A;  . MASS EXCISION Right 06/25/2014   Procedure: EXCISION BUTTOCK MASS ;  Surgeon: Coralie Keens, MD;  Location: [facility];  Service: General;  Laterality: Right;  . ORIF HUMERUS FRACTURE Left 08/27/2013   Procedure: OPEN REDUCTION INTERNAL FIXATION (ORIF) LEFT HUMERUS ;  Surgeon: Rozanna Box, MD;  Location: [place];  Service: Orthopedics;  Laterality: Left;  . POSTERIOR FUSION CERVICAL SPINE  1992  . THROMBECTOMY / EMBOLECTOMY FEMORAL ARTERY Right    hx/notes 03/20/2000  . TOTAL HIP ARTHROPLASTY Right 07/19/2016   Procedure: TOTAL HIP ARTHROPLASTY  ANTERIOR APPROACH;  Surgeon: [name], MD;  Location: [facility];  Service: Orthopedics;  Laterality: Right;  . TOTAL HIP REVISION Right 08/22/2017   Procedure: TOTAL HIP REVISION;  Surgeon: [name], MD;  Location: [place];  Service: Orthopedics;  Laterality: Right;     Current Meds  Medication Sig  . albuterol (PROVENTIL HFA;VENTOLIN HFA) 108 (90 Base) MCG/ACT inhaler Inhale 2 puffs into the lungs every 6 (six) hours as needed for wheezing or shortness of breath.  . ALPRAZolam (XANAX) 0.5 MG tablet Take 0.5 mg by mouth 3 (three) times daily as needed for anxiety.  Marland Kitchen atorvastatin (LIPITOR) 80 MG tablet Take 1 tablet (80 mg total) by mouth daily at 6 PM.  . buPROPion (WELLBUTRIN XL) 150 MG 24 hr tablet Take 150 mg by mouth daily.  . clopidogrel (PLAVIX) 75 MG tablet Take 75 mg by mouth daily.  . diclofenac sodium (VOLTAREN) 1 % GEL Apply 1  g topically daily as needed (pain).  Marland Kitchen ELIQUIS 5 MG TABS tablet TAKE ONE TABLET BY MOUTH TWICE A DAY  . ezetimibe (ZETIA) 10 MG tablet Take 10 mg by mouth daily.  . furosemide (LASIX) 20 MG tablet Take 1 tablet (20 mg total) by mouth daily as needed.  . isosorbide mononitrate (IMDUR) 60 MG 24 hr tablet TAKE ONE TABLET BY MOUTH EVERY MORNING  . metoprolol tartrate (LOPRESSOR) 50 MG tablet TAKE ONE TABLET BY MOUTH TWICE A DAY  . nitroGLYCERIN (NITROSTAT) 0.4 MG SL tablet Place 1 tablet (0.4 mg total) under the tongue every 5 (five) minutes as needed for chest pain.  Marland Kitchen oxyCODONE (OXY IR/ROXICODONE) 5 MG immediate release tablet Take 1 tablet by mouth as needed.  . pantoprazole (PROTONIX) 40 MG tablet Take 1 tablet (40 mg total) daily by mouth.  . ranolazine (RANEXA) 1000 MG SR tablet Take 1,000 mg by mouth 2 (two) times daily.  . sertraline (ZOLOFT) 100 MG tablet Take 150 mg by mouth daily.    Current Facility-Administered Medications for the 07/02/18 encounter (Telemedicine) with Lorelle Macaluso, Wonda Cheng, MD  Medication  . ferrous sulfate tablet 325 mg      Allergies:   Benadryl [diphenhydramine]; Adhesive [tape]; Amoxicillin; Cyclobenzaprine; and Hydrocodone   Social History   Tobacco Use  . Smoking status: Former Smoker    Packs/day: 0.50    Years: 34.00    Pack years: 17.00    Types: Cigarettes    Last attempt to quit: 07/20/1998    Years since quitting: 19.9  . Smokeless tobacco: Never Used  Substance Use Topics  . Alcohol use: Not Currently  . Drug use: Not Currently    Types: Marijuana     Family Hx: The patient's family history includes Clotting disorder in her father; Heart disease in her brother and brother; Lung cancer in her mother and sister. There is no history of Colon cancer or Breast cancer.  ROS:   Please see the history of present illness.     All other systems reviewed and are negative.   Prior CV studies:   The following studies were reviewed today:    Labs/Other Tests and Data Reviewed:    EKG:  No ECG reviewed.  Recent Labs: 08/25/2017: BUN <5; Creatinine, Ser 0.61; Potassium 3.6; Sodium 145 09/07/2017: Hemoglobin 11.8; Platelets 289 10/02/2017: ALT 17   Recent Lipid Panel Lab Results  Component Value Date/Time   CHOL 98 (L) 12/05/2016 11:39 AM   TRIG 73 12/05/2016 11:39 AM   HDL 43 12/05/2016 11:39 AM   CHOLHDL 2.3 12/05/2016 11:39 AM   CHOLHDL 4.1 08/13/2015 09:39 AM   LDLCALC 40 12/05/2016 11:39 AM    Wt Readings from Last 3 Encounters:  07/02/18 170 lb 3.2 oz (77.2 kg)  01/01/18 172 lb 12.8 oz (78.4 kg)  10/02/17 165 lb (74.8 kg)     Objective:    Vital Signs:  BP (!) 100/57 (BP Location: Left Arm, Patient Position: Sitting, Cuff Size: Normal)   Pulse (!) 55   Ht 5\' 1"  (1.549 m)   Wt 170 lb 3.2 oz (77.2 kg)   BMI 32.16 kg/m    No exam - telephone visit   ASSESSMENT & PLAN:    1.  CAD : she  is very stable.  She does have occasional episodes of angina but this is not unusual for her.  She needs to go back to the store to get some nitroglycerin.  We tried her on Ranexa and  this did not seem to help.  2.  Paroxysmal atrial fibrillation: Clinically she is doing fairly well.  Continue Eliquis.  Her heart rate is well controlled.  3.  Hypertension: Blood pressure is well controlled.  Continue current medication    COVID-19 Education: The signs and symptoms of COVID-19 were discussed with the patient and how to seek care for testing (follow up with PCP or arrange E-visit).  The importance of social distancing was discussed today.  Time:   Today, I have spent  15  minutes with the patient with telehealth technology discussing the above problems.     Medication Adjustments/Labs and Tests Ordered: Current medicines are reviewed at length with the patient today.  Concerns regarding medicines are outlined above.   Tests Ordered: No orders of the defined types were placed in this encounter.   Medication Changes: No orders of the defined types were placed in this encounter.   Disposition:  Follow up in 6 month(s)  Signed, [name], MD  07/02/2018 4:50 PM    [facility]

## 2018-07-12 DIAGNOSIS — M25552 Pain in left hip: Secondary | ICD-10-CM | POA: Diagnosis not present

## 2018-07-13 DIAGNOSIS — M5416 Radiculopathy, lumbar region: Secondary | ICD-10-CM | POA: Diagnosis not present

## 2018-07-13 DIAGNOSIS — M25552 Pain in left hip: Secondary | ICD-10-CM | POA: Diagnosis not present

## 2018-07-16 ENCOUNTER — Other Ambulatory Visit: Payer: Medicare HMO

## 2018-07-19 DIAGNOSIS — M545 Low back pain: Secondary | ICD-10-CM | POA: Diagnosis not present

## 2018-07-23 ENCOUNTER — Other Ambulatory Visit: Payer: Self-pay | Admitting: Cardiovascular Disease

## 2018-07-24 DIAGNOSIS — F322 Major depressive disorder, single episode, severe without psychotic features: Secondary | ICD-10-CM | POA: Diagnosis not present

## 2018-07-24 DIAGNOSIS — J449 Chronic obstructive pulmonary disease, unspecified: Secondary | ICD-10-CM | POA: Diagnosis not present

## 2018-07-24 DIAGNOSIS — M5416 Radiculopathy, lumbar region: Secondary | ICD-10-CM | POA: Diagnosis not present

## 2018-07-24 DIAGNOSIS — M545 Low back pain: Secondary | ICD-10-CM | POA: Diagnosis not present

## 2018-07-24 DIAGNOSIS — M549 Dorsalgia, unspecified: Secondary | ICD-10-CM | POA: Diagnosis not present

## 2018-07-24 DIAGNOSIS — I25810 Atherosclerosis of coronary artery bypass graft(s) without angina pectoris: Secondary | ICD-10-CM | POA: Diagnosis not present

## 2018-08-17 ENCOUNTER — Telehealth: Payer: Self-pay | Admitting: *Deleted

## 2018-08-17 DIAGNOSIS — M5416 Radiculopathy, lumbar region: Secondary | ICD-10-CM | POA: Diagnosis not present

## 2018-08-17 DIAGNOSIS — M461 Sacroiliitis, not elsewhere classified: Secondary | ICD-10-CM | POA: Diagnosis not present

## 2018-08-17 DIAGNOSIS — M48062 Spinal stenosis, lumbar region with neurogenic claudication: Secondary | ICD-10-CM | POA: Diagnosis not present

## 2018-08-17 DIAGNOSIS — M47816 Spondylosis without myelopathy or radiculopathy, lumbar region: Secondary | ICD-10-CM | POA: Diagnosis not present

## 2018-08-17 NOTE — Telephone Encounter (Signed)
I received a surgery clearance fax Friday 08/17/18 for the pt. I unfortunately could not make out the hand writting as to type of surgery being preformed etc. I left message today for surgery scheduler Janett Billow to please call back [phone] with surgery information: type of surgery, date of surgery, medications to be held and how long, type of anesthesia and fax #. Once I have all information needed I will route to our Pre OP pool for evaluation for clearance.  Shakitta called back from DR. Bartko's office. I discussed the surgery information needed. I will place prop clearance information in and route to Preop and PharmD.     [facility] Pre-operative Risk Assessment    Request for surgical clearance:  1. What type of surgery is being performed? LUMBAR TRANS FORAMINAL ESI L2-3, L3-4.   2. When is this surgery scheduled? TBD  3. What type of clearance is required (medical clearance vs. Pharmacy clearance to hold med vs. Both)? BOTH  4. Are there any medications that need to be held prior to surgery and how long? PLAVIX 7 DAYS AND HOLD ELIQUIS X 3-5 DAYS  5. Practice name and name of physician performing surgery? [facility]; DR. BARTKO  6. What is your office phone number [phone]   7.   What is your office fax number [phone]  8.   Anesthesia type (None, local, MAC, general) ? GENERAL   Julaine Hua 08/20/2018, 1:21 PM  _________________________________________________________________   (provider comments below)

## 2018-08-20 NOTE — Telephone Encounter (Signed)
Follow up    Wendy [name] is returning the call    Please call back

## 2018-08-20 NOTE — Telephone Encounter (Signed)
Patient continued to have anginal episode requiring SL nitro, usually once every 2 weeks with resolution of symptoms. Patient will need office visit for further evaluation with EKG. Possible stress test.   Pharmacy to review anticoagulation. Dr. Cathie Olden to review Plavix.

## 2018-08-20 NOTE — Telephone Encounter (Signed)
Pt takes Eliquis for afib with CHADS2VASc score of 5 (age, sex, CHF, HTN, CAD/PAD), also with hx of DVT > 10 years ago. Renal function is normal. Ok to hold Eliquis for 3 days prior to procedure.

## 2018-08-20 NOTE — Telephone Encounter (Signed)
Called pt re: surgical clearance and need for an in office visit for anginal pain requiring nitroglycerin, per Vin Bhagat, PA-C.  Pt has answered no to all covid19 screening questions.      COVID-19 Pre-Screening Questions:  . In the past 7 to 10 days have you had a cough,  shortness of breath, headache, congestion, fever (100 or greater) body aches, chills, sore throat, or sudden loss of taste or sense of smell? . Have you been around anyone with known Covid 19. . Have you been around anyone who is awaiting Covid 19 test results in the past 7 to 10 days? . Have you been around anyone who has been exposed to Covid 19, or has mentioned symptoms of Covid 19 within the past 7 to 10 days?  If you have any concerns/questions about symptoms patients report during screening (either on the phone or at threshold). Contact the provider seeing the patient or DOD for further guidance.  If neither are available contact a member of the leadership team.

## 2018-08-20 NOTE — Telephone Encounter (Signed)
Please pass recommendatoins fro anticoagulatoin to surgeon

## 2018-08-21 ENCOUNTER — Other Ambulatory Visit: Payer: Self-pay

## 2018-08-21 ENCOUNTER — Ambulatory Visit (INDEPENDENT_AMBULATORY_CARE_PROVIDER_SITE_OTHER): Payer: Medicare HMO | Admitting: Cardiology

## 2018-08-21 ENCOUNTER — Encounter: Payer: Self-pay | Admitting: Cardiology

## 2018-08-21 VITALS — BP 156/70 | HR 56 | Ht 61.0 in | Wt 175.0 lb

## 2018-08-21 DIAGNOSIS — E785 Hyperlipidemia, unspecified: Secondary | ICD-10-CM

## 2018-08-21 DIAGNOSIS — I10 Essential (primary) hypertension: Secondary | ICD-10-CM

## 2018-08-21 DIAGNOSIS — Z01810 Encounter for preprocedural cardiovascular examination: Secondary | ICD-10-CM

## 2018-08-21 DIAGNOSIS — I480 Paroxysmal atrial fibrillation: Secondary | ICD-10-CM | POA: Diagnosis not present

## 2018-08-21 DIAGNOSIS — I25708 Atherosclerosis of coronary artery bypass graft(s), unspecified, with other forms of angina pectoris: Secondary | ICD-10-CM

## 2018-08-21 DIAGNOSIS — F1910 Other psychoactive substance abuse, uncomplicated: Secondary | ICD-10-CM | POA: Diagnosis not present

## 2018-08-21 MED ORDER — FUROSEMIDE 20 MG PO TABS: 20.0000 mg | ORAL_TABLET | Freq: Every day | ORAL | 3 refills | Status: DC | PRN

## 2018-08-21 NOTE — Telephone Encounter (Signed)
Hold plavix for 6 days prior

## 2018-08-21 NOTE — Patient Instructions (Signed)
Medication Instructions:  Your physician recommends that you continue on your current medications as directed. Please refer to the Current Medication list given to you today.  If you need a refill on your cardiac medications before your next appointment, please call your pharmacy.   Lab work: None  If you have labs (blood work) drawn today and your tests are completely normal, you will receive your results only by: Marland Kitchen MyChart Message (if you have MyChart) OR . A paper copy in the mail If you have any lab test that is abnormal or we need to change your treatment, we will call you to review the results.  Testing/Procedures: None  Follow-Up: At [facility], you and your health needs are our priority.  As part of our continuing mission to provide you with exceptional heart care, we have created designated Provider Care Teams.  These Care Teams include your primary Cardiologist (physician) and Advanced Practice Providers (APPs -  Physician Assistants and Nurse Practitioners) who all work together to provide you with the care you need, when you need it. You will need a follow up appointment in:  6 months.  Please call our office 2 months in advance to schedule this appointment.  You may see [name], MD or one of the following Advanced Practice Providers on your designated Care Team: Richardson Dopp, PA-C [place], [place] . Daune Perch, NP  Any Other Special Instructions Will Be Listed Below (If Applicable).      Pecolia Ades, NP
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
## 2018-08-21 NOTE — Telephone Encounter (Signed)
Dr. [name], I am seeing this pt today for pre-op clearance. She has severe CAD with looks like long time stable angina and no good options for further revascularization. Please comment on holding Plavix for 7 days.

## 2018-08-22 NOTE — Telephone Encounter (Signed)
   Primary Cardiologist:Philip Nahser, MD  Chart reviewed as part of pre-operative protocol coverage. Pre-op clearance already addressed by colleagues in earlier phone notes. To summarize recommendations:  -Pt was seen in the office on 08/21/2018 by me. She has severe CAD with stable angina.  -Pt previously felt to be at moderate cardiovascular risk for surgery. Her angina pattern has been stable for many years. She did well with hip surgery in 08/2017.  She is still moderate risk, but acceptable for this low risk back injection.  -OK to hold Plavix for 6 days prior to procedure. Resume as soon as felt safe after injection.  (for full information see cardiology office visit note from 08/21/18)  Per our pharmacy recommendations: Pt takes Eliquis for afib with CHADS2VASc score of 5 (age, sex, CHF, HTN, CAD/PAD), also with hx of DVT > 10 years ago. Renal function is normal. Ok to hold Eliquis for 3 days prior to procedure.  Will route this bundled recommendation to requesting provider via Epic fax function. Please call with questions.  Daune Perch, NP 08/22/2018, 2:09 PM

## 2018-08-24 ENCOUNTER — Telehealth: Payer: Self-pay | Admitting: Cardiovascular Disease

## 2018-08-24 NOTE — Telephone Encounter (Signed)
New Message     Pt thinks she has a blood clot in the back of her left leg.    Please call

## 2018-08-24 NOTE — Telephone Encounter (Signed)
Reviewed w/ DOD. We could not get pt LE doppler today through our office, pt advised to go to ED for evaluation d/t reported symptoms and DVT hx. Patient verbalized understanding and agreeable to plan.

## 2018-08-24 NOTE — Telephone Encounter (Signed)
Pt reports: "Pain in the calf of LLE and foot.  Hurts in 3 different spots". Denies swelling, redness, tenderness or warm skin.  Pt reports hx of clot in RLE. Taking Eliquis --  Pt had held this for 5 days for spinal injection w/ Dr. Brien Few.  She just restarted this morning. Pt aware I will review with DOD, Dr Marlou Porch and call her back w/ recommendation/s. Pt agreeable to plan.

## 2018-08-26 NOTE — Telephone Encounter (Signed)
It does not appear that pt went to ED Needs call to f/u on leg

## 2018-08-27 NOTE — Telephone Encounter (Signed)
Called to follow up with patient. She states that the pain that she had on Friday she feels like was related to her back. She states that it started in her back and went down her left leg. She states that she took Tylenol and a muscle relaxant and the pain resolved. She denies increased SOB, pain, swelling, redness, tenderness, or any other Sx. Patient states that she will continue to monitor and let us know if her Sx return.

## 2018-08-30 ENCOUNTER — Other Ambulatory Visit: Payer: Self-pay | Admitting: Cardiovascular Disease

## 2018-09-05 ENCOUNTER — Other Ambulatory Visit: Payer: Self-pay | Admitting: Cardiovascular Disease

## 2018-09-13 ENCOUNTER — Emergency Department (HOSPITAL_COMMUNITY)
Admission: EM | Admit: 2018-09-13 | Discharge: 2018-09-14 | Disposition: A | Discharge status: Home / Self Care | Payer: Medicare HMO | Attending: Emergency Medicine | Admitting: Emergency Medicine

## 2018-09-13 ENCOUNTER — Encounter (HOSPITAL_COMMUNITY): Payer: Self-pay | Admitting: Emergency Medicine

## 2018-09-13 ENCOUNTER — Other Ambulatory Visit: Payer: Self-pay

## 2018-09-13 ENCOUNTER — Emergency Department (HOSPITAL_COMMUNITY): Payer: Medicare HMO

## 2018-09-13 DIAGNOSIS — I2510 Atherosclerotic heart disease of native coronary artery without angina pectoris: Secondary | ICD-10-CM | POA: Insufficient documentation

## 2018-09-13 DIAGNOSIS — J449 Chronic obstructive pulmonary disease, unspecified: Secondary | ICD-10-CM | POA: Insufficient documentation

## 2018-09-13 DIAGNOSIS — R079 Chest pain, unspecified: Secondary | ICD-10-CM | POA: Diagnosis not present

## 2018-09-13 DIAGNOSIS — G4733 Obstructive sleep apnea (adult) (pediatric): Secondary | ICD-10-CM | POA: Diagnosis not present

## 2018-09-13 DIAGNOSIS — I959 Hypotension, unspecified: Secondary | ICD-10-CM | POA: Diagnosis not present

## 2018-09-13 DIAGNOSIS — Z951 Presence of aortocoronary bypass graft: Secondary | ICD-10-CM | POA: Insufficient documentation

## 2018-09-13 DIAGNOSIS — Z96641 Presence of right artificial hip joint: Secondary | ICD-10-CM | POA: Insufficient documentation

## 2018-09-13 DIAGNOSIS — Z79899 Other long term (current) drug therapy: Secondary | ICD-10-CM | POA: Diagnosis not present

## 2018-09-13 DIAGNOSIS — R55 Syncope and collapse: Secondary | ICD-10-CM

## 2018-09-13 DIAGNOSIS — Z7901 Long term (current) use of anticoagulants: Secondary | ICD-10-CM | POA: Diagnosis not present

## 2018-09-13 DIAGNOSIS — I10 Essential (primary) hypertension: Secondary | ICD-10-CM | POA: Insufficient documentation

## 2018-09-13 DIAGNOSIS — Z87891 Personal history of nicotine dependence: Secondary | ICD-10-CM | POA: Diagnosis not present

## 2018-09-13 DIAGNOSIS — R4182 Altered mental status, unspecified: Secondary | ICD-10-CM | POA: Diagnosis not present

## 2018-09-13 LAB — CBG MONITORING, ED: Glucose-Capillary: 162 mg/dL — ABNORMAL HIGH (ref 70–99)

## 2018-09-13 LAB — CBC
HCT: 38.3 % (ref 36.0–46.0)
Hemoglobin: 11.9 g/dL — ABNORMAL LOW (ref 12.0–15.0)
MCH: 30.7 pg (ref 26.0–34.0)
MCHC: 31.1 g/dL (ref 30.0–36.0)
MCV: 98.7 fL (ref 80.0–100.0)
Platelets: 211 10*3/uL (ref 150–400)
RBC: 3.88 MIL/uL (ref 3.87–5.11)
RDW: 15.1 % (ref 11.5–15.5)
WBC: 6.3 10*3/uL (ref 4.0–10.5)
nRBC: 0 % (ref 0.0–0.2)

## 2018-09-13 LAB — BASIC METABOLIC PANEL
Anion gap: 11 (ref 5–15)
BUN: 11 mg/dL (ref 8–23)
CO2: 24 mmol/L (ref 22–32)
Calcium: 8.5 mg/dL — ABNORMAL LOW (ref 8.9–10.3)
Chloride: 108 mmol/L (ref 98–111)
Creatinine, Ser: 0.9 mg/dL (ref 0.44–1.00)
GFR calc Af Amer: >60 mL/min (ref >60)
GFR calc non Af Amer: >60 mL/min (ref >60)
Glucose, Bld: 148 mg/dL — ABNORMAL HIGH (ref 70–99)
Potassium: 4.1 mmol/L (ref 3.5–5.1)
Sodium: 143 mmol/L (ref 135–145)

## 2018-09-13 LAB — TROPONIN I (HIGH SENSITIVITY): Troponin I (High Sensitivity): 10 ng/L (ref <18)

## 2018-09-13 MED ORDER — SODIUM CHLORIDE 0.9 % IV BOLUS
500.0000 mL | Freq: Once | INTRAVENOUS | Status: AC
  Administered 2018-09-13: 500 mL via INTRAVENOUS

## 2018-09-13 NOTE — ED Provider Notes (Signed)
[facility] EMERGENCY DEPARTMENT Provider Note   CSN: [id number] Arrival date & time: 09/13/18  2053     History   Chief Complaint Chief Complaint  Patient presents with  . Loss of Consciousness  . Chest Pain    HPI Wendy [name] is a 68 y.o. female.  She has a history of coronary disease and paroxysmal A. fib on anticoagulation.  She has had a CABG and has angina for which she takes nitro when she needs it.  Follows with Dr. Katharina Caper from cardiology.  She said she had on and off chest pain today and took a nitro this morning.  It recurred again this evening and she took a nitro which burned under her tongue and then made her feel lightheaded.  She said she was sitting in a chair and then passed out and then woke up in the ambulance was there.  Currently she denies any pain and feels back to baseline.  No recent illness no fevers no cough no abdominal pain vomiting or diarrhea.     The history is provided by the patient.  Loss of Consciousness Episode history:  Single Most recent episode:  Today Progression:  Resolved Chronicity:  New Witnessed: yes   Relieved by:  Lying down Worsened by:  Nothing Ineffective treatments:  None tried Associated symptoms: chest pain and dizziness   Associated symptoms: no fever, no headaches, no nausea, no seizures, no shortness of breath and no vomiting   Risk factors: coronary artery disease   Chest Pain Associated symptoms: dizziness and syncope   Associated symptoms: no abdominal pain, no fever, no headache, no nausea, no shortness of breath and no vomiting     Past Medical History:  Diagnosis Date  . Anemia   . Anginal pain ([place])    jaw pain 07/2016, s/p 07/15/16 nuclear stress test  . Anxiety   . Arthritis    "lower back" (08/24/2017)  . CAD (coronary artery disease), native coronary artery    a. Hx CABG 1998, last PCI 2013 b.LHC 07/2013:  EF 55-60%, L-LAD prob atretic, no sig disease in LAD, S-OM1 ok with patent  stent, S-dRCA ok => med Rx. c. LHC 01/2015 DES to SVG-RCA.  Marland Kitchen Chronic back pain   . Chronic leg pain   . Chronic lower back pain   . COPD (chronic obstructive pulmonary disease) ([place])   . Depression   . DVT (deep venous thrombosis) (H[facility]) X 3   RLE  . Fall 08/2016  . GERD (gastroesophageal reflux disease)   . Glucose intolerance (impaired glucose tolerance)   . Headache    "a few/month" (08/24/2017)  . HLD (hyperlipidemia)   . Hypertension   . Myocardial infarction ([place]) 1999  . OSA on CPAP   . PAD (peripheral artery disease) ([place])   . PAF (paroxysmal atrial fibrillation) ([place])    a. 09/2013 post-op b. 01/2015  . Peripheral neuropathy   . Shortness of breath    when walking  . Wears glasses     Patient Active Problem List   Diagnosis Date Noted  . 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 Acute combined systolic and diastolic heart failure ([place]) 07/26/2016  . Troponin level elevated 07/26/2016  . Chronic anticoagulation 07/22/2016  . CAD S/P multiple PCIs-last one 2016 07/22/2016  . NSTEMI (non-ST elevated myocardial infarction) ([place]) 07/22/2016  . Primary osteoarthritis of right hip 06/27/2016  . Avascular necrosis of hip, right ([place]) 06/27/2016  . PVD (peripheral  vascular disease) ([place])   . Essential (primary) hypertension 01/14/2015  . Coronary artery disease involving native coronary artery of native heart without angina pectoris 10/06/2014  . Acute angina ([place]) 05/12/2014  . Epigastric pain   . Chest pain with high risk of acute coronary syndrome 10/21/2013  . Acute respiratory failure ([place]) 09/20/2013  . Nonspecific abnormal results of liver function study 09/17/2013  . Cholecystitis, acute 09/15/2013  . PAD (peripheral artery disease) ([place])   . COPD (chronic obstructive pulmonary disease) ([place])   . Peripheral neuropathy   . Chronic back pain   . Sleep apnea   . Anxiety   . GERD (gastroesophageal reflux disease)   . 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 Hypertension 07/20/2013  . HLD (hyperlipidemia) 07/20/2013  . Unstable angina ([place]) 07/19/2013  . Hx of CABG 07/19/2013    Past Surgical History:  Procedure Laterality Date  . ANTERIOR CERVICAL DECOMP/DISCECTOMY FUSION  1991  . BACK SURGERY    . CARDIAC CATHETERIZATION N/A 01/22/2015   Procedure: Left Heart Cath and Coronary Angiography;  Surgeon: Troy Sine, MD;  Location: [place] [facility];  Service: Cardiovascular;  Laterality: N/A;  . CARDIAC CATHETERIZATION N/A 01/22/2015   Procedure: Coronary Stent Intervention;  Surgeon: Troy Sine, MD;  Location: [place] [facility];  Service: Cardiovascular;  Laterality: N/A;  3.0x12 xience prox SVG to RCA  . CARDIAC CATHETERIZATION  03/09/2000   hx/notes 03/20/2000  . CHOLECYSTECTOMY N/A 09/16/2013   Procedure: LAPAROSCOPIC CHOLECYSTECTOMY CONVERTED TO OPEN CHOLECYSTECTOMY WITH CHOLANGIOGRAM;  Surgeon: [name], MD;  Location: [place];  Service: General;  Laterality: N/A;  . CORONARY ANGIOPLASTY WITH STENT PLACEMENT  11/2011, 02/2012, 01/2015   DES to SVG-RCA both times, In [place], [place], Dr. Bruce Donath  . CORONARY ARTERY BYPASS GRAFT  1998   LIMA-LAD, SVG-OM, SVG-RCA  . ERCP N/A 09/19/2013   Procedure: ENDOSCOPIC RETROGRADE CHOLANGIOPANCREATOGRAPHY (ERCP);  Surgeon: [name], MD;  Location: [facility];  Service: Endoscopy;  Laterality: N/A;  . FINGER SURGERY Right    ring finger "fused"  . FRACTURE SURGERY    . IRRIGATION AND DEBRIDEMENT ABSCESS Right 10/17/2013   Procedure: INCISION AND DRAINAGE RIGHT SUBCOSTAL WOUND;  Surgeon: [name], MD;  Location: [facility];  Service: General;  Laterality: Right;  . JOINT REPLACEMENT    . KNEE ARTHROSCOPY Right 1998  . LEFT HEART CATH AND CORS/GRAFTS ANGIOGRAPHY N/A 07/25/2016   Procedure: Left Heart Cath and Cors/Grafts Angiography;  Surgeon: Jettie Booze, MD;  Location: [place];  Service: Cardiovascular;  Laterality: N/A;  . LEFT HEART  CATHETERIZATION WITH CORONARY/GRAFT ANGIOGRAM N/A 07/19/2013   Procedure: LEFT HEART CATHETERIZATION WITH Beatrix Fetters;  Surgeon: Leonie Man, MD;  Location: [facility];  Service: Cardiovascular;  Laterality: N/A;  . MASS EXCISION Right 06/25/2014   Procedure: EXCISION BUTTOCK MASS ;  Surgeon: Coralie Keens, MD;  Location: [place];  Service: General;  Laterality: Right;  . ORIF HUMERUS FRACTURE Left 08/27/2013   Procedure: OPEN REDUCTION INTERNAL FIXATION (ORIF) LEFT HUMERUS ;  Surgeon: Rozanna Box, MD;  Location: [place];  Service: Orthopedics;  Laterality: Left;  . POSTERIOR FUSION CERVICAL SPINE  1992  . THROMBECTOMY / EMBOLECTOMY FEMORAL ARTERY Right    hx/notes 03/20/2000  . TOTAL HIP ARTHROPLASTY Right 07/19/2016   Procedure: TOTAL HIP ARTHROPLASTY ANTERIOR APPROACH;  Surgeon: [name], MD;  Location: [facility];  Service: Orthopedics;  Laterality: Right;  . TOTAL HIP REVISION Right  08/22/2017   Procedure: TOTAL HIP REVISION;  Surgeon: [name], MD;  Location: [facility];  Service: Orthopedics;  Laterality: Right;     OB History   No obstetric history on file.      Home Medications    Prior to Admission medications   Medication Sig Start Date End Date Taking? Authorizing Provider  albuterol (PROVENTIL HFA;VENTOLIN HFA) 108 (90 Base) MCG/ACT inhaler Inhale 2 puffs into the lungs every 6 (six) hours as needed for wheezing or shortness of breath. 01/01/18   Bhagat, Crista Luria, PA  ALPRAZolam (XANAX) 0.5 MG tablet Take 0.5 mg by mouth 3 (three) times daily as needed for anxiety.    [provider]  atorvastatin (LIPITOR) 80 MG tablet TAKE ONE TABLET BY MOUTH DAILY AT 6PM 08/31/18   Nahser, Wonda Cheng, MD  buPROPion (WELLBUTRIN XL) 150 MG 24 hr tablet Take 150 mg by mouth daily.    [provider]  clopidogrel (PLAVIX) 75 MG tablet Take 75 mg by mouth daily.    [provider]  diclofenac sodium (VOLTAREN) 1 % GEL Apply  1 g topically daily as needed (pain).    [provider]  ELIQUIS 5 MG TABS tablet TAKE ONE TABLET BY MOUTH TWICE A DAY 07/23/18   Nahser, Wonda Cheng, MD  ezetimibe (ZETIA) 10 MG tablet Take 10 mg by mouth daily.    [provider]  furosemide (LASIX) 20 MG tablet Take 1 tablet (20 mg total) by mouth daily as needed. 08/21/18 08/21/19  Daune Perch, NP  isosorbide mononitrate (IMDUR) 60 MG 24 hr tablet TAKE ONE TABLET BY MOUTH EVERY MORNING 05/30/18   Nahser, Wonda Cheng, MD  metoprolol tartrate (LOPRESSOR) 50 MG tablet TAKE ONE TABLET BY MOUTH TWICE A DAY 05/30/18   Nahser, Wonda Cheng, MD  nitroGLYCERIN (NITROSTAT) 0.4 MG SL tablet Place 1 tablet (0.4 mg total) under the tongue every 5 (five) minutes as needed for chest pain. 01/01/18   Bhagat, Crista Luria, PA  oxyCODONE (OXY IR/ROXICODONE) 5 MG immediate release tablet Take 1 tablet by mouth as needed. 12/25/17   [provider]  pantoprazole (PROTONIX) 40 MG tablet Take 1 tablet (40 mg total) daily by mouth. 01/16/17   Nahser, Wonda Cheng, MD  ranolazine (RANEXA) 1000 MG SR tablet Take 1,000 mg by mouth 2 (two) times daily.    [provider]  sertraline (ZOLOFT) 100 MG tablet Take 150 mg by mouth daily.     [provider]  triamcinolone cream (KENALOG) 0.1 % Apply 1 application topically as needed. 04/25/18   [provider]    Family History Family History  Problem Relation Age of Onset  . Lung cancer Mother   . Clotting disorder Father   . Lung cancer Sister   . Heart disease Brother   . Heart disease Brother   . Colon cancer Neg Hx   . Breast cancer Neg Hx     Social History Social History   Tobacco Use  . Smoking status: Former Smoker    Packs/day: 0.50    Years: 34.00    Pack years: 17.00    Types: Cigarettes    Quit date: 07/20/1998    Years since quitting: 20.1  . Smokeless tobacco: Never Used  Substance Use Topics  . Alcohol use: Not Currently  . Drug use: Not Currently     Types: Marijuana     Allergies   Benadryl [diphenhydramine], Adhesive [tape], Amoxicillin, Cyclobenzaprine, and Hydrocodone   Review of Systems Review  of Systems  Constitutional: Negative for fever.  HENT: Negative for sore throat.   Eyes: Negative for visual disturbance.  Respiratory: Negative for shortness of breath.   Cardiovascular: Positive for chest pain and syncope.  Gastrointestinal: Negative for abdominal pain, nausea and vomiting.  Genitourinary: Negative for dysuria.  Musculoskeletal: Negative for neck pain.  Skin: Negative for rash.  Neurological: Positive for dizziness and syncope. Negative for seizures and headaches.     Physical Exam Updated Vital Signs BP (!) 88/64 (BP Location: Left Arm)   Pulse 61   Temp 97.8 F (36.6 C) (Oral)   Resp 19   SpO2 95%   Physical Exam Vitals signs and nursing note reviewed.  Constitutional:      General: She is not in acute distress.    Appearance: She is well-developed.  HENT:     Head: Normocephalic and atraumatic.  Eyes:     Conjunctiva/sclera: Conjunctivae normal.  Neck:     Musculoskeletal: Neck supple.  Cardiovascular:     Rate and Rhythm: Normal rate and regular rhythm.     Heart sounds: Normal heart sounds. No murmur.  Pulmonary:     Effort: Pulmonary effort is normal. No respiratory distress.     Breath sounds: Normal breath sounds.  Abdominal:     Palpations: Abdomen is soft.     Tenderness: There is no abdominal tenderness.  Musculoskeletal: Normal range of motion.     Right lower leg: She exhibits no tenderness.     Left lower leg: She exhibits no tenderness.  Skin:    General: Skin is warm and dry.     Capillary Refill: Capillary refill takes less than 2 seconds.  Neurological:     General: No focal deficit present.     Mental Status: She is alert and oriented to person, place, and time.      ED Treatments / Results  Labs (all labs ordered are listed, but only abnormal results are  displayed) Labs Reviewed  BASIC METABOLIC PANEL - Abnormal; Notable for the following components:      Result Value   Glucose, Bld 148 (*)    Calcium 8.5 (*)    All other components within normal limits  CBC - Abnormal; Notable for the following components:   Hemoglobin 11.9 (*)    All other components within normal limits  CBG MONITORING, ED - Abnormal; Notable for the following components:   Glucose-Capillary 162 (*)    All other components within normal limits  SARS CORONAVIRUS 2 (HOSPITAL ORDER, [facility])  TROPONIN I (HIGH SENSITIVITY)  TROPONIN I (HIGH SENSITIVITY)    EKG EKG Interpretation  Date/Time:  Thursday September 13 2018 20:56:44 EDT Ventricular Rate:  62 PR Interval:    QRS Duration: 94 QT Interval:  421 QTC Calculation: 428 R Axis:   85 Text Interpretation:  Sinus rhythm Anterior infarct, old Borderline repolarization abnormality nonspecific st/ts similar to 2018 Confirmed by Aletta Edouard [phone]) on 09/13/2018 9:24:10 PM   Radiology Dg Chest Port 1 View  Result Date: 09/13/2018 CLINICAL DATA:  Chest pain EXAM: PORTABLE CHEST 1 VIEW COMPARISON:  August 15, 2016 FINDINGS: The patient is status post prior CABG. The heart is enlarged. There is mild elevation of the right hemidiaphragm which is similar to prior studies. There is no pneumothorax. There is mild vascular congestion. There is no acute osseous abnormality. The patient is status post prior ORIF of the proximal left humerus. IMPRESSION: No active disease. Electronically Signed  By: Constance Holster M.D.   On: 09/13/2018 21:50    Procedures Procedures (including critical care time)  Medications Ordered in ED Medications  sodium chloride 0.9 % bolus 500 mL (0 mLs Intravenous Stopped 09/14/18 0033)     Initial Impression / Assessment and Plan / ED Course  I have reviewed the triage vital signs and the nursing notes.  Pertinent labs & imaging results that were available during  my care of the patient were reviewed by me and considered in my medical decision making (see chart for details).  Clinical Course as of Sep 13 998  Thu Sep 12, 7852  7180 68 year old female with known coronary disease here after a syncopal event while using nitro for her angina.  Differential includes hypovolemia hypotension, medication reaction, arrhythmia, ACS, pneumonia   [MB]  2301 Patient's initial troponin is slightly elevated at 10.  She will need a delta troponin.  The rest of her lab work is been fairly unremarkable.  Blood pressure is improving with fluids.   [MB]    Clinical Course User Index [MB] Hayden Rasmussen, MD   [name] was evaluated in Emergency Department on 09/13/2018 for the symptoms described in the history of present illness. She was evaluated in the context of the global COVID-19 pandemic, which necessitated consideration that the patient might be at risk for infection with the SARS-CoV-2 virus that causes COVID-19. Institutional protocols and algorithms that pertain to the evaluation of patients at risk for COVID-19 are in a state of rapid change based on information released by regulatory bodies including the CDC and federal and state organizations. These policies and algorithms were followed during the patient's care in the ED.     Final Clinical Impressions(s) / ED Diagnoses   Final diagnoses:  Syncope and collapse    ED Discharge Orders    None       Hayden Rasmussen, MD 09/14/18 1001

## 2018-09-13 NOTE — ED Notes (Signed)
Suanne Marker [phone] pts sister, wants an update

## 2018-09-13 NOTE — ED Triage Notes (Signed)
[facility], had sudden onset chest pain, took 1SL NTG, then had a syncopal episode. Denies chest pain at this time. Pt hypotensive with EMS, SBP 70s. Given 600ccNS by EMS with no change. Pt A&O x 4 at this time.

## 2018-09-13 NOTE — Discharge Instructions (Signed)
You were seen in the emergency department for a fainting spell after you have taken nitroglycerin.  Your blood pressure was low here.  You had blood work EKG and a chest x-ray that did not show any serious findings.  It will be important for you to contact Dr. [name] and your primary care doctor for close follow-up.  Please return if any worsening symptoms.

## 2018-09-14 LAB — TROPONIN I (HIGH SENSITIVITY): Troponin I (High Sensitivity): 11 ng/L (ref <18)

## 2018-09-14 NOTE — ED Notes (Signed)
Patient verbalizes understanding of discharge instructions. Opportunity for questioning and answers were provided. Armband removed by staff, pt discharged from ED.  

## 2018-09-14 NOTE — ED Provider Notes (Signed)
Care assumed from Dr. Melina Copa. Patient pending second troponin.  Episode of near syncope after taking nitroglycerin at home for her typical angina.  No further episodes of chest pain.  She denies hitting her head or having a headache.  She does take Eliquis. Neuro intact.  EKG is unchanged.  Troponin negative x2.  Patient wanted to go home and does not want to stay for observation. Patient states she feels fine.  No headache, chest pain or shortness of breath.  She states her angina is stable and the episode she had today is no different than what she usually has.  Offered observation admission which she declines.  Feel her near syncope is likely due to hypotension after nitroglycerin use. Patient appears to have capacity to refuse further testing and observation at this point and wishes to go home.  She knows to return if she gets worse.   Wendy Essex, MD 09/14/18 [phone]

## 2018-10-04 NOTE — Progress Notes (Deleted)
Cardiology Office Note:    Date:  10/04/2018   ID:  CARISMA TROUPE, DOB 1951/03/03, MRN [id number]  PCP:  Josetta Huddle, MD  Cardiologist:  [name], MD   Referring MD: Josetta Huddle, MD   No chief complaint on file. ***  History of Present Illness:    Wendy [name] is a 68 y.o. female with a hx of PAD, CAD status post NSTEMI s/p CABG x3 (SVG-OM, LIMA-LAD, SVG-RCA in 1998) with PCI on 2013, last heart cath in 2018, hypertension, chronic combined systolic and diastolic heart failure, COPD, GERD, peripheral neuropathy, and marijuana use with one occurrence of cocaine positive UDA.  She has atrial fibrillation and is anticoagulated with Eliquis for a CHADSVASC of 4. 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  She did well with the surgery.She was last seen in clinic on 08/21/2018 by Daune Perch and P.  She was seen for preoperative evaluation for back surgery.  Of note, she has chronic angina that manifests as aching in her neck and jaws.  This can be exertional and sometimes awakens her from sleep.  It is generally resolved with 1-2 sublingual nitroglycerin tablets.  She smokes marijuana regularly but denies cocaine use.  She was seen in the ER on 09/13/2018 for near syncope.  She declined admission for observation.  She had an episode of near syncope after taking an nitroglycerin tablet.  Troponin x2 was negative.  She presents today for follow-up.   CAD Hx of CABG x 3 She has extensive disease and chronic angina which she manages medically and with PRN nitro.    Chronic systolic and diastolic heart failure           Past Medical History:  Diagnosis Date   Anemia    Anginal pain ([place])    jaw pain 07/2016, s/p 07/15/16 nuclear stress test   Anxiety    Arthritis    "lower back" (08/24/2017)   CAD (coronary artery disease), native coronary artery    a. Hx CABG 1998, last PCI 2013 b.LHC 07/2013:  EF 55-60%, L-LAD  prob atretic, no sig disease in LAD, S-OM1 ok with patent stent, S-dRCA ok => med Rx. c. LHC 01/2015 DES to SVG-RCA.   Chronic back pain    Chronic leg pain    Chronic lower back pain    COPD (chronic obstructive pulmonary disease) (HCC)    Depression    DVT (deep venous thrombosis) ([place]) X 3   RLE   Fall 08/2016   GERD (gastroesophageal reflux disease)    Glucose intolerance (impaired glucose tolerance)    Headache    "a few/month" (08/24/2017)   HLD (hyperlipidemia)    Hypertension    Myocardial infarction ([place]) 1999   OSA on CPAP    PAD (peripheral artery disease) (HCC)    PAF (paroxysmal atrial fibrillation) ([place])    a. 09/2013 post-op b. 01/2015   Peripheral neuropathy    Shortness of breath    when walking   Wears glasses     Past Surgical History:  Procedure Laterality Date   ANTERIOR CERVICAL DECOMP/DISCECTOMY FUSION  1991   BACK SURGERY     CARDIAC CATHETERIZATION N/A 01/22/2015   Procedure: Left Heart Cath and Coronary Angiography;  Surgeon: Troy Sine, MD;  Location: [facility];  Service: Cardiovascular;  Laterality: N/A;   CARDIAC CATHETERIZATION N/A 01/22/2015   Procedure: Coronary Stent Intervention;  Surgeon: Troy Sine, MD;  Location: [facility];  Service: Cardiovascular;  Laterality: N/A;  3.0x12 xience prox SVG to RCA   CARDIAC CATHETERIZATION  03/09/2000   hx/notes 03/20/2000   CHOLECYSTECTOMY N/A 09/16/2013   Procedure: LAPAROSCOPIC CHOLECYSTECTOMY CONVERTED TO OPEN CHOLECYSTECTOMY WITH CHOLANGIOGRAM;  Surgeon: [name], MD;  Location: [facility];  Service: General;  Laterality: N/A;   CORONARY ANGIOPLASTY WITH STENT PLACEMENT  11/2011, 02/2012, 01/2015   DES to SVG-RCA both times, In [place], [place], Dr. Bruce Donath   CORONARY ARTERY BYPASS GRAFT  1998   LIMA-LAD, SVG-OM, SVG-RCA   ERCP N/A 09/19/2013   Procedure: ENDOSCOPIC RETROGRADE CHOLANGIOPANCREATOGRAPHY (ERCP);  Surgeon: [name], MD;   Location: [facility];  Service: Endoscopy;  Laterality: N/A;   FINGER SURGERY Right    ring finger "fused"   FRACTURE SURGERY     IRRIGATION AND DEBRIDEMENT ABSCESS Right 10/17/2013   Procedure: INCISION AND DRAINAGE RIGHT SUBCOSTAL WOUND;  Surgeon: [name], MD;  Location: [facility];  Service: General;  Laterality: Right;   JOINT REPLACEMENT     KNEE ARTHROSCOPY Right 1998   LEFT HEART CATH AND CORS/GRAFTS ANGIOGRAPHY N/A 07/25/2016   Procedure: Left Heart Cath and Cors/Grafts Angiography;  Surgeon: Jettie Booze, MD;  Location: [facility];  Service: Cardiovascular;  Laterality: N/A;   LEFT HEART CATHETERIZATION WITH CORONARY/GRAFT ANGIOGRAM N/A 07/19/2013   Procedure: LEFT HEART CATHETERIZATION WITH Beatrix Fetters;  Surgeon: Leonie Man, MD;  Location: [facility];  Service: Cardiovascular;  Laterality: N/A;   MASS EXCISION Right 06/25/2014   Procedure: EXCISION BUTTOCK MASS ;  Surgeon: Coralie Keens, MD;  Location: [facility];  Service: General;  Laterality: Right;   ORIF HUMERUS FRACTURE Left 08/27/2013   Procedure: OPEN REDUCTION INTERNAL FIXATION (ORIF) LEFT HUMERUS ;  Surgeon: Rozanna Box, MD;  Location: [facility];  Service: Orthopedics;  Laterality: Left;   POSTERIOR FUSION CERVICAL SPINE  1992   THROMBECTOMY / EMBOLECTOMY FEMORAL ARTERY Right    hx/notes 03/20/2000   TOTAL HIP ARTHROPLASTY Right 07/19/2016   Procedure: TOTAL HIP ARTHROPLASTY ANTERIOR APPROACH;  Surgeon: [name], MD;  Location: [facility];  Service: Orthopedics;  Laterality: Right;   TOTAL HIP REVISION Right 08/22/2017   Procedure: TOTAL HIP REVISION;  Surgeon: [name], MD;  Location: [facility];  Service: Orthopedics;  Laterality: Right;    Current Medications: No outpatient medications have been marked as taking for the 10/05/18 encounter (Appointment) with Ledora Bottcher, [place].   Current Facility-Administered Medications for the 10/05/18  encounter (Appointment) with Ledora Bottcher, PA  Medication   ferrous sulfate tablet 325 mg     Allergies:   Benadryl [diphenhydramine], Adhesive [tape], Amoxicillin, Cyclobenzaprine, and Hydrocodone   Social History   Socioeconomic History   Marital status: Divorced    Spouse name: Not on file   Number of children: 5   Years of education: Not on file   Highest education level: Not on file  Occupational History   Occupation: Disabled  Scientist, product/process development strain: Not on file   Food insecurity    Worry: Not on file    Inability: Not on file   Transportation needs    Medical: Not on file    Non-medical: Not on file  Tobacco Use   Smoking status: Former Smoker    Packs/day: 0.50    Years: 34.00    Pack years: 17.00    Types: Cigarettes    Quit date:  07/20/1998    Years since quitting: 20.2   Smokeless tobacco: Never Used  Substance and Sexual Activity   Alcohol use: Not Currently   Drug use: Not Currently    Types: Marijuana   Sexual activity: Not Currently  Lifestyle   Physical activity    Days per week: Not on file    Minutes per session: Not on file   Stress: Not on file  Relationships   Social connections    Talks on phone: Not on file    Gets together: Not on file    Attends religious service: Not on file    Active member of club or organization: Not on file    Attends meetings of clubs or organizations: Not on file    Relationship status: Not on file  Other Topics Concern   Not on file  Social History Narrative   Lives with best friend.      Family History: The patient's ***family history includes Clotting disorder in her father; Heart disease in her brother and brother; Lung cancer in her mother and sister. There is no history of Colon cancer or Breast cancer.  ROS:   Please see the history of present illness.    *** All other systems reviewed and are negative.  EKGs/Labs/Other Studies Reviewed:    The following  studies were reviewed today: ***  EKG:  EKG is *** ordered today.  The ekg ordered today demonstrates ***  Recent Labs: 09/13/2018: BUN 11; Creatinine, Ser 0.90; Hemoglobin 11.9; Platelets 211; Potassium 4.1; Sodium 143  Recent Lipid Panel    Component Value Date/Time   CHOL 98 (L) 12/05/2016 1139   TRIG 73 12/05/2016 1139   HDL 43 12/05/2016 1139   CHOLHDL 2.3 12/05/2016 1139   CHOLHDL 4.1 08/13/2015 0939   VLDL 48 (H) 08/13/2015 0939   LDLCALC 40 12/05/2016 1139    Physical Exam:    VS:  There were no vitals taken for this visit.    Wt Readings from Last 3 Encounters:  08/21/18 175 lb (79.4 kg)  07/02/18 170 lb 3.2 oz (77.2 kg)  01/01/18 172 lb 12.8 oz (78.4 kg)     GEN: *** Well nourished, well developed in no acute distress HEENT: Normal NECK: No JVD; No carotid bruits LYMPHATICS: No lymphadenopathy CARDIAC: ***RRR, no murmurs, rubs, gallops RESPIRATORY:  Clear to auscultation without rales, wheezing or rhonchi  ABDOMEN: Soft, non-tender, non-distended MUSCULOSKELETAL:  No edema; No deformity  SKIN: Warm and dry NEUROLOGIC:  Alert and oriented x 3 PSYCHIATRIC:  Normal affect   ASSESSMENT:    No diagnosis found. PLAN:    In order of problems listed above:  No diagnosis found.   Medication Adjustments/Labs and Tests Ordered: Current medicines are reviewed at length with the patient today.  Concerns regarding medicines are outlined above.  No orders of the defined types were placed in this encounter.  No orders of the defined types were placed in this encounter.   Signed, Ledora Bottcher, PA  10/04/2018 9:25 PM    [facility]

## 2018-10-05 ENCOUNTER — Ambulatory Visit: Payer: Medicare HMO | Admitting: Physician Assistant

## 2018-10-08 NOTE — Progress Notes (Signed)
Cardiology Office Note:    Date:  10/09/2018   ID:  Wendy [name], DOB [birth year], MRN [id number]  PCP:  Josetta Huddle, MD  Cardiologist:  [name], MD   Referring MD: Josetta Huddle, MD   Chief Complaint  Patient presents with  . Hospitalization Follow-up    chronic angina    History of Present Illness:    Wendy [name] is a 68 y.o. female with a hx of PAD, CAD status post NSTEMI s/p CABG x3 (SVG-OM, LIMA-LAD, SVG-RCA in 1998) with PCI on 2013, last heart cath in 2018, hypertension, chronic combined systolic and diastolic heart failure, COPD, GERD, peripheral neuropathy, and marijuana use with one occurrence of cocaine positive UDA.  She has atrial fibrillation and is anticoagulated with Eliquis for a CHADSVASC of 4. 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  She did well with the surgery.She was last seen in clinic on 08/21/2018 by Daune Perch NP.  She was seen for preoperative evaluation for back surgery.  Of note, she has chronic angina that manifests as aching in her neck and jaws.  This can be exertional and sometimes awakens her from sleep.  It is generally resolved with 1-2 sublingual nitroglycerin tablets.  She smokes marijuana regularly but denies cocaine use.  She was seen in the ER on 09/13/2018 for near syncope.  She declined admission for observation.  She had an episode of near syncope after taking an nitroglycerin tablet.  Troponin x2 were negative.  Pressure on arrival was 88/64. Suspect hypotension after nitro administration as the etiology of her syncopal episode. She presents today for follow-up.  She has decreased her salt intake and therefore only takes 20 mg lasix PRN. She hasn't taken it in 2 weeks. Hypertensive today at 175/85. She states her pressure is generally 130/60. She at first tells me she's hypotensive in the evenings. But later states that she never checks her pressure in the evenings She is not  sleeping and states her anxiety is higher. She has chronic back and hip pain. She missed her epidural shot in her back today because she has had a cough for a month, no fever.    Past Medical History:  Diagnosis Date  . Anemia   . Anginal pain ([place])    jaw pain 07/2016, s/p 07/15/16 nuclear stress test  . Anxiety   . Arthritis    "lower back" (08/24/2017)  . CAD (coronary artery disease), native coronary artery    a. Hx CABG 1998, last PCI 2013 b.LHC 07/2013:  EF 55-60%, L-LAD prob atretic, no sig disease in LAD, S-OM1 ok with patent stent, S-dRCA ok => med Rx. c. LHC 01/2015 DES to SVG-RCA.  Marland Kitchen Chronic back pain   . Chronic leg pain   . Chronic lower back pain   . COPD (chronic obstructive pulmonary disease) ([place])   . Depression   . DVT (deep venous thrombosis) (HCC) X 3   RLE  . Fall 08/2016  . GERD (gastroesophageal reflux disease)   . Glucose intolerance (impaired glucose tolerance)   . Headache    "a few/month" (08/24/2017)  . HLD (hyperlipidemia)   . Hypertension   . Myocardial infarction ([place]) 1999  . OSA on CPAP   . PAD (peripheral artery disease) ([place])   . PAF (paroxysmal atrial fibrillation) ([place])    a. 09/2013 post-op b. 01/2015  . Peripheral neuropathy   . Shortness of breath    when walking  .  Wears glasses     Past Surgical History:  Procedure Laterality Date  . ANTERIOR CERVICAL DECOMP/DISCECTOMY FUSION  1991  . BACK SURGERY    . CARDIAC CATHETERIZATION N/A 01/22/2015   Procedure: Left Heart Cath and Coronary Angiography;  Surgeon: Troy Sine, MD;  Location: [facility];  Service: Cardiovascular;  Laterality: N/A;  . CARDIAC CATHETERIZATION N/A 01/22/2015   Procedure: Coronary Stent Intervention;  Surgeon: Troy Sine, MD;  Location: [facility];  Service: Cardiovascular;  Laterality: N/A;  3.0x12 xience prox SVG to RCA  . CARDIAC CATHETERIZATION  03/09/2000   hx/notes 03/20/2000  . CHOLECYSTECTOMY N/A 09/16/2013   Procedure: LAPAROSCOPIC  CHOLECYSTECTOMY CONVERTED TO OPEN CHOLECYSTECTOMY WITH CHOLANGIOGRAM;  Surgeon: [name], MD;  Location: [facility];  Service: General;  Laterality: N/A;  . CORONARY ANGIOPLASTY WITH STENT PLACEMENT  11/2011, 02/2012, 01/2015   DES to SVG-RCA both times, In [place], [place], Dr. Bruce Donath  . CORONARY ARTERY BYPASS GRAFT  1998   LIMA-LAD, SVG-OM, SVG-RCA  . ERCP N/A 09/19/2013   Procedure: ENDOSCOPIC RETROGRADE CHOLANGIOPANCREATOGRAPHY (ERCP);  Surgeon: [name], MD;  Location: [facility];  Service: Endoscopy;  Laterality: N/A;  . FINGER SURGERY Right    ring finger "fused"  . FRACTURE SURGERY    . IRRIGATION AND DEBRIDEMENT ABSCESS Right 10/17/2013   Procedure: INCISION AND DRAINAGE RIGHT SUBCOSTAL WOUND;  Surgeon: [name], MD;  [facility];  Service: General;  Laterality: Right;  . JOINT REPLACEMENT    . KNEE ARTHROSCOPY Right 1998  . LEFT HEART CATH AND CORS/GRAFTS ANGIOGRAPHY N/A 07/25/2016   Procedure: Left Heart Cath and Cors/Grafts Angiography;  Surgeon: Jettie Booze, MD;  Location: [facility];  Service: Cardiovascular;  Laterality: N/A;  . LEFT HEART CATHETERIZATION WITH CORONARY/GRAFT ANGIOGRAM N/A 07/19/2013   Procedure: LEFT HEART CATHETERIZATION WITH Beatrix Fetters;  Surgeon: Leonie Man, MD;  Location: [facility];  Service: Cardiovascular;  Laterality: N/A;  . MASS EXCISION Right 06/25/2014   Procedure: EXCISION BUTTOCK MASS ;  Surgeon: Coralie Keens, MD;  Location: [facility];  Service: General;  Laterality: Right;  . ORIF HUMERUS FRACTURE Left 08/27/2013   Procedure: OPEN REDUCTION INTERNAL FIXATION (ORIF) LEFT HUMERUS ;  Surgeon: Rozanna Box, MD;  Location: [facility];  Service: Orthopedics;  Laterality: Left;  . POSTERIOR FUSION CERVICAL SPINE  1992  . THROMBECTOMY / EMBOLECTOMY FEMORAL ARTERY Right    hx/notes 03/20/2000  . TOTAL HIP ARTHROPLASTY Right 07/19/2016   Procedure: TOTAL HIP ARTHROPLASTY ANTERIOR  APPROACH;  Surgeon: [name], MD;  Location: [place] [facility];  Service: Orthopedics;  Laterality: Right;  . TOTAL HIP REVISION Right 08/22/2017   Procedure: TOTAL HIP REVISION;  Surgeon: [name], MD;  Location: [facility];  Service: Orthopedics;  Laterality: Right;    Current Medications: No outpatient medications have been marked as taking for the 10/09/18 encounter (Office Visit) with Ledora Bottcher, [place].   Current Facility-Administered Medications for the 10/09/18 encounter (Office Visit) with Ledora Bottcher, PA  Medication  . ferrous sulfate tablet 325 mg     Allergies:   Benadryl [diphenhydramine], Adhesive [tape], Amoxicillin, Cyclobenzaprine, and Hydrocodone   Social History   Socioeconomic History  . Marital status: Divorced    Spouse name: Not on file  . Number of children: 5  . Years of education: Not on file  . Highest education level: Not on file  Occupational History  . Occupation: Disabled  Social Needs  . Financial resource strain: Not on file  . Food insecurity    Worry: Not on file    Inability: Not on file  . Transportation needs    Medical: Not on file    Non-medical: Not on file  Tobacco Use  . Smoking status: Former Smoker    Packs/day: 0.50    Years: 34.00    Pack years: 17.00    Types: Cigarettes    Quit date: 07/20/1998    Years since quitting: 20.2  . Smokeless tobacco: Never Used  Substance and Sexual Activity  . Alcohol use: Not Currently  . Drug use: Not Currently    Types: Marijuana  . Sexual activity: Not Currently  Lifestyle  . Physical activity    Days per week: Not on file    Minutes per session: Not on file  . Stress: Not on file  Relationships  . Social Herbalist on phone: Not on file    Gets together: Not on file    Attends religious service: Not on file    Active member of club or organization: Not on file    Attends meetings of clubs or organizations: Not on file    Relationship status: Not on file   Other Topics Concern  . Not on file  Social History Narrative   Lives with best friend.      Family History: The patient's family history includes Clotting disorder in her father; Heart disease in her brother and brother; Lung cancer in her mother and sister. There is no history of Colon cancer or Breast cancer.  ROS:   Please see the history of present illness.     All other systems reviewed and are negative.  EKGs/Labs/Other Studies Reviewed:    The following studies were reviewed today:  Left heart cath 07/25/16:  Severe three-vessel coronary artery disease.  Unable to gain access from the left common femoral. It appears there is a left to right fem-fem graft based on the wire course after needle access to the vessel.  Occluded SVG to marginal, known from prior. LIMA to LAD known to be atretic from prior cath.  SVG to PDA occluded. This is a new finding.  Mid LAD lesion, 50 %stenosed. This is an ulcerated, irregular lesion. There are left to right collaterals.  The left ventricular ejection fraction is 25-35% by visual estimate.  There is moderate to severe left ventricular systolic dysfunction.  LV end diastolic pressure is moderately elevated.  There is no aortic valve stenosis.  Of note, poor radial pulses bilaterally. Right brachial artery was accessed using ultrasound guidance.   I suspect her troponin elevation is from heart failure. It does not have a typical ACS pattern. I don't think her SVG to RCA occlusion is acute.  Continue medical therapy for heart failure.  Consider viability study. If inferior wall is viable, could consider cardiac surgery consultation for repeat CABG.   EKG:  EKG is ordered today.  The ekg ordered today demonstrates sinus rhythm, HR 63, nonspecific T wave changes that appear stable from prior  Recent Labs: 09/13/2018: BUN 11; Creatinine, Ser 0.90; Hemoglobin 11.9; Platelets 211; Potassium 4.1; Sodium 143  Recent Lipid Panel     Component Value Date/Time   CHOL 98 (L) 12/05/2016 1139   TRIG 73 12/05/2016 1139   HDL 43 12/05/2016 1139   CHOLHDL 2.3 12/05/2016 1139   CHOLHDL 4.1 08/13/2015 0939   VLDL 48 (H) 08/13/2015 9485  [place] 40 12/05/2016 1139    Physical Exam:    VS:  BP (!) 175/85   Pulse 63   Temp (!) 97.2 F (36.2 C) (Temporal)   Ht 5\' 1"  (1.549 m)   Wt 168 lb 9.6 oz (76.5 kg)   BMI 31.86 kg/m     Wt Readings from Last 3 Encounters:  10/09/18 168 lb 9.6 oz (76.5 kg)  08/21/18 175 lb (79.4 kg)  07/02/18 170 lb 3.2 oz (77.2 kg)     GEN: Well nourished, well developed in no acute distress HEENT: Normal NECK: No JVD; No carotid bruits LYMPHATICS: No lymphadenopathy CARDIAC: RRR, no murmurs, rubs, gallops RESPIRATORY:  Clear to auscultation without rales, wheezing or rhonchi  ABDOMEN: Soft, non-tender, non-distended MUSCULOSKELETAL:  No edema; No deformity  SKIN: Warm and dry NEUROLOGIC:  Alert and oriented x 3 PSYCHIATRIC:  Normal affect   ASSESSMENT:    1. Coronary artery disease of bypass graft of native heart with stable angina pectoris ([place])   2. Hx of CABG   3. Chronic stable angina ([place])   4. PAF (paroxysmal atrial fibrillation) ([place])   5. Essential (primary) hypertension   6. Chronic combined systolic and diastolic heart failure (HCC)    PLAN:    In order of problems listed above:  CAD Hx of CABG x 3 Chronic angina She has extensive disease and chronic angina which she manages medically and with PRN nitro. I will increase her imdur to 90 mg in the AM since she has generally been hypertensive. Hopefully this will decrease her PRN nitro use and decrease her likelihood of further syncope, which is important in the setting of eliquis and plavix. I asked her to keep a BP log for 2 weeks and let me know if this helps her CP. Continue ranexa and plavix, BB, zetia and lipitor.   PAF Sinus rhythm today. No bleeding on eliquis and plavix.    Chronic systolic and diastolic  heart failure She is euvolemic today. She only takes lasix PRN.    Hypertension She is hypertensive today. Based on our conversation, I believe she has room to tolerate 90 mg imdur. I have asked her to keep a BP log for 2 weeks. Further titrate imdur as needed.   Follow up in 3 months.    Medication Adjustments/Labs and Tests Ordered: Current medicines are reviewed at length with the patient today.  Concerns regarding medicines are outlined above.  Orders Placed This Encounter  Procedures  . EKG 12-Lead   Meds ordered this encounter  Medications  . isosorbide mononitrate (IMDUR) 60 MG 24 hr tablet    Sig: Take 1.5 tablets (90 mg total) by mouth every morning.    Dispense:  45 tablet    Refill:  3    Signed, Ledora Bottcher, [place]  10/09/2018 4:46 PM    [facility]

## 2018-10-09 ENCOUNTER — Ambulatory Visit (INDEPENDENT_AMBULATORY_CARE_PROVIDER_SITE_OTHER): Payer: Medicare HMO | Admitting: Physician Assistant

## 2018-10-09 ENCOUNTER — Encounter: Payer: Self-pay | Admitting: Physician Assistant

## 2018-10-09 ENCOUNTER — Other Ambulatory Visit: Payer: Self-pay

## 2018-10-09 VITALS — BP 175/85 | HR 63 | Temp 97.2°F | Ht 61.0 in | Wt 168.6 lb

## 2018-10-09 DIAGNOSIS — I5042 Chronic combined systolic (congestive) and diastolic (congestive) heart failure: Secondary | ICD-10-CM | POA: Diagnosis not present

## 2018-10-09 DIAGNOSIS — I208 Other forms of angina pectoris: Secondary | ICD-10-CM

## 2018-10-09 DIAGNOSIS — I25708 Atherosclerosis of coronary artery bypass graft(s), unspecified, with other forms of angina pectoris: Secondary | ICD-10-CM

## 2018-10-09 DIAGNOSIS — I10 Essential (primary) hypertension: Secondary | ICD-10-CM | POA: Diagnosis not present

## 2018-10-09 DIAGNOSIS — I480 Paroxysmal atrial fibrillation: Secondary | ICD-10-CM | POA: Diagnosis not present

## 2018-10-09 DIAGNOSIS — Z951 Presence of aortocoronary bypass graft: Secondary | ICD-10-CM

## 2018-10-09 DIAGNOSIS — I110 Hypertensive heart disease with heart failure: Secondary | ICD-10-CM | POA: Diagnosis not present

## 2018-10-09 MED ORDER — ISOSORBIDE MONONITRATE ER 60 MG PO TB24: 90.0000 mg | ORAL_TABLET | Freq: Every morning | ORAL | 3 refills | Status: DC

## 2018-10-09 NOTE — Patient Instructions (Signed)
Medication Instructions:  Increase Isosorbide 90 mg every morning.  If you need a refill on your cardiac medications before your next appointment, please call your pharmacy.    Follow-Up: At [facility], you and your health needs are our priority.  As part of our continuing mission to provide you with exceptional heart care, we have created designated Provider Care Teams.  These Care Teams include your primary Cardiologist (physician) and Advanced Practice Providers (APPs -  Physician Assistants and Nurse Practitioners) who all work together to provide you with the care you need, when you need it. You will need a follow up appointment in:  3 months.  Please call our office 2 months in advance to schedule this appointment.  You may see [name], MD or one of the following Advanced Practice Providers on your designated Care Team: . Daune Perch, NP  Any Other Special Instructions Will Be Listed Below (If Applicable). Your physician has requested that you regularly monitor and record your blood pressure readings at home for 2 weeks. Please use the same machine at the same time of day to check your readings and record them to bring to your follow-up visit.

## 2018-10-16 ENCOUNTER — Other Ambulatory Visit: Payer: Self-pay | Admitting: Physician Assistant

## 2018-10-17 NOTE — Telephone Encounter (Signed)
She needs to get this from PCP or her lung doctor.

## 2018-10-22 DIAGNOSIS — J449 Chronic obstructive pulmonary disease, unspecified: Secondary | ICD-10-CM | POA: Diagnosis not present

## 2018-10-22 DIAGNOSIS — I480 Paroxysmal atrial fibrillation: Secondary | ICD-10-CM | POA: Diagnosis not present

## 2018-10-22 DIAGNOSIS — F322 Major depressive disorder, single episode, severe without psychotic features: Secondary | ICD-10-CM | POA: Diagnosis not present

## 2018-10-22 DIAGNOSIS — I25810 Atherosclerosis of coronary artery bypass graft(s) without angina pectoris: Secondary | ICD-10-CM | POA: Diagnosis not present

## 2018-10-22 DIAGNOSIS — E785 Hyperlipidemia, unspecified: Secondary | ICD-10-CM | POA: Diagnosis not present

## 2018-10-22 DIAGNOSIS — I10 Essential (primary) hypertension: Secondary | ICD-10-CM | POA: Diagnosis not present

## 2018-10-22 DIAGNOSIS — I208 Other forms of angina pectoris: Secondary | ICD-10-CM | POA: Diagnosis not present

## 2018-10-24 DIAGNOSIS — I10 Essential (primary) hypertension: Secondary | ICD-10-CM | POA: Diagnosis not present

## 2018-10-24 DIAGNOSIS — M5416 Radiculopathy, lumbar region: Secondary | ICD-10-CM | POA: Diagnosis not present

## 2018-10-24 DIAGNOSIS — M48062 Spinal stenosis, lumbar region with neurogenic claudication: Secondary | ICD-10-CM | POA: Diagnosis not present

## 2018-10-24 DIAGNOSIS — Z6830 Body mass index (BMI) 30.0-30.9, adult: Secondary | ICD-10-CM | POA: Diagnosis not present

## 2018-10-25 DIAGNOSIS — E785 Hyperlipidemia, unspecified: Secondary | ICD-10-CM | POA: Diagnosis not present

## 2018-10-25 DIAGNOSIS — F411 Generalized anxiety disorder: Secondary | ICD-10-CM | POA: Diagnosis not present

## 2018-10-25 DIAGNOSIS — R1310 Dysphagia, unspecified: Secondary | ICD-10-CM | POA: Diagnosis not present

## 2018-10-25 DIAGNOSIS — M549 Dorsalgia, unspecified: Secondary | ICD-10-CM | POA: Diagnosis not present

## 2018-10-25 DIAGNOSIS — D6869 Other thrombophilia: Secondary | ICD-10-CM | POA: Diagnosis not present

## 2018-10-25 DIAGNOSIS — F322 Major depressive disorder, single episode, severe without psychotic features: Secondary | ICD-10-CM | POA: Diagnosis not present

## 2018-10-25 DIAGNOSIS — J449 Chronic obstructive pulmonary disease, unspecified: Secondary | ICD-10-CM | POA: Diagnosis not present

## 2018-10-25 DIAGNOSIS — I480 Paroxysmal atrial fibrillation: Secondary | ICD-10-CM | POA: Diagnosis not present

## 2018-10-25 DIAGNOSIS — I10 Essential (primary) hypertension: Secondary | ICD-10-CM | POA: Diagnosis not present

## 2018-10-25 DIAGNOSIS — I25810 Atherosclerosis of coronary artery bypass graft(s) without angina pectoris: Secondary | ICD-10-CM | POA: Diagnosis not present

## 2018-11-20 ENCOUNTER — Other Ambulatory Visit: Payer: Self-pay | Admitting: Cardiovascular Disease

## 2018-11-20 MED ORDER — ATORVASTATIN CALCIUM 80 MG PO TABS: ORAL_TABLET | ORAL | 3 refills | Status: DC

## 2018-11-20 MED ORDER — APIXABAN 5 MG PO TABS: 5.0000 mg | ORAL_TABLET | Freq: Two times a day (BID) | ORAL | 1 refills | Status: DC

## 2018-11-20 MED ORDER — ISOSORBIDE MONONITRATE ER 60 MG PO TB24: 90.0000 mg | ORAL_TABLET | Freq: Every morning | ORAL | 3 refills | Status: DC

## 2018-11-20 MED ORDER — FUROSEMIDE 20 MG PO TABS: 20.0000 mg | ORAL_TABLET | Freq: Every day | ORAL | 3 refills | Status: DC | PRN

## 2018-11-20 MED ORDER — METOPROLOL TARTRATE 50 MG PO TABS: 50.0000 mg | ORAL_TABLET | Freq: Two times a day (BID) | ORAL | 3 refills | Status: DC

## 2018-11-20 MED ORDER — NITROGLYCERIN 0.4 MG SL SUBL: 0.4000 mg | SUBLINGUAL_TABLET | SUBLINGUAL | 5 refills | Status: DC | PRN

## 2018-11-20 MED ORDER — PANTOPRAZOLE SODIUM 40 MG PO TBEC: 40.0000 mg | DELAYED_RELEASE_TABLET | Freq: Every day | ORAL | 3 refills | Status: DC

## 2018-11-20 NOTE — Telephone Encounter (Signed)
New Message    *STAT* If patient is at the pharmacy, call can be transferred to refill team.   1. Which medications need to be refilled? (please list name of each medication and dose if known) atorvastatin (LIPITOR) 80 MG tablet, ELIQUIS 5 MG TABS tablet, furosemide (LASIX) 20 MG tablet, isosorbide mononitrate (IMDUR) 60 MG 24 hr tablet, metoprolol tartrate (LOPRESSOR) 50 MG tablet, nitroGLYCERIN (NITROSTAT) 0.4 MG SL tablet, pantoprazole (PROTONIX) 40 MG tablet             2. Which pharmacy/location (including street and city if local pharmacy) is medication to be sent to? [place]  3. Do they need a 30 day or 90 day supply? 90  Patient needs an updated rx sent.

## 2018-11-20 NOTE — Telephone Encounter (Signed)
Pt needs Eliquis sent to [place] new pharmacy [place]. Please address

## 2018-11-20 NOTE — Telephone Encounter (Signed)
Age 68, weight 76.5kg, SCr 0.9 on 09/13/18, last OV August 2020, afib indication

## 2018-11-26 ENCOUNTER — Other Ambulatory Visit: Payer: Self-pay

## 2018-11-26 ENCOUNTER — Emergency Department (HOSPITAL_COMMUNITY)
Admission: EM | Admit: 2018-11-26 | Discharge: 2018-11-26 | Disposition: A | Discharge status: Home / Self Care | Payer: Medicare HMO | Attending: Emergency Medicine | Admitting: Emergency Medicine

## 2018-11-26 ENCOUNTER — Encounter (HOSPITAL_COMMUNITY): Payer: Self-pay

## 2018-11-26 DIAGNOSIS — Z5321 Procedure and treatment not carried out due to patient leaving prior to being seen by health care provider: Secondary | ICD-10-CM | POA: Diagnosis not present

## 2018-11-26 DIAGNOSIS — M79604 Pain in right leg: Secondary | ICD-10-CM | POA: Diagnosis not present

## 2018-11-26 DIAGNOSIS — E785 Hyperlipidemia, unspecified: Secondary | ICD-10-CM | POA: Diagnosis not present

## 2018-11-26 DIAGNOSIS — I25810 Atherosclerosis of coronary artery bypass graft(s) without angina pectoris: Secondary | ICD-10-CM | POA: Diagnosis not present

## 2018-11-26 DIAGNOSIS — I10 Essential (primary) hypertension: Secondary | ICD-10-CM | POA: Diagnosis not present

## 2018-11-26 DIAGNOSIS — I480 Paroxysmal atrial fibrillation: Secondary | ICD-10-CM | POA: Diagnosis not present

## 2018-11-26 DIAGNOSIS — I208 Other forms of angina pectoris: Secondary | ICD-10-CM | POA: Diagnosis not present

## 2018-11-26 DIAGNOSIS — F322 Major depressive disorder, single episode, severe without psychotic features: Secondary | ICD-10-CM | POA: Diagnosis not present

## 2018-11-26 DIAGNOSIS — J449 Chronic obstructive pulmonary disease, unspecified: Secondary | ICD-10-CM | POA: Diagnosis not present

## 2018-11-26 NOTE — ED Triage Notes (Signed)
Pt c.o right leg pain since last week, hx of DVT years ago, states she takes plavix and eliquis. No redness or swelling noted in the area.

## 2018-11-26 NOTE — ED Notes (Signed)
Pt to the desk stated she was leaving.

## 2018-11-27 ENCOUNTER — Other Ambulatory Visit: Payer: Self-pay

## 2018-11-27 ENCOUNTER — Encounter (HOSPITAL_COMMUNITY): Payer: Self-pay | Admitting: Emergency Medicine

## 2018-11-27 ENCOUNTER — Emergency Department (HOSPITAL_COMMUNITY)
Admission: EM | Admit: 2018-11-27 | Discharge: 2018-11-28 | Disposition: A | Discharge status: Home / Self Care | Payer: Medicare HMO | Attending: Emergency Medicine | Admitting: Emergency Medicine

## 2018-11-27 DIAGNOSIS — I252 Old myocardial infarction: Secondary | ICD-10-CM | POA: Diagnosis not present

## 2018-11-27 DIAGNOSIS — Z951 Presence of aortocoronary bypass graft: Secondary | ICD-10-CM | POA: Insufficient documentation

## 2018-11-27 DIAGNOSIS — Z7902 Long term (current) use of antithrombotics/antiplatelets: Secondary | ICD-10-CM | POA: Diagnosis not present

## 2018-11-27 DIAGNOSIS — I2510 Atherosclerotic heart disease of native coronary artery without angina pectoris: Secondary | ICD-10-CM | POA: Insufficient documentation

## 2018-11-27 DIAGNOSIS — Z79899 Other long term (current) drug therapy: Secondary | ICD-10-CM | POA: Diagnosis not present

## 2018-11-27 DIAGNOSIS — M79605 Pain in left leg: Secondary | ICD-10-CM | POA: Diagnosis not present

## 2018-11-27 DIAGNOSIS — Z7901 Long term (current) use of anticoagulants: Secondary | ICD-10-CM | POA: Diagnosis not present

## 2018-11-27 DIAGNOSIS — M79604 Pain in right leg: Secondary | ICD-10-CM | POA: Insufficient documentation

## 2018-11-27 DIAGNOSIS — J449 Chronic obstructive pulmonary disease, unspecified: Secondary | ICD-10-CM | POA: Insufficient documentation

## 2018-11-27 DIAGNOSIS — I10 Essential (primary) hypertension: Secondary | ICD-10-CM | POA: Insufficient documentation

## 2018-11-27 DIAGNOSIS — Z87891 Personal history of nicotine dependence: Secondary | ICD-10-CM | POA: Insufficient documentation

## 2018-11-27 DIAGNOSIS — R52 Pain, unspecified: Secondary | ICD-10-CM | POA: Diagnosis not present

## 2018-11-27 NOTE — ED Triage Notes (Addendum)
Pt arrives via [facility] from home with c/o pain to entire R leg, hx of dvt and states pain feels the same. Pt was here last night but LWBS. Pt currently on eloquis and plavis, no redness,swelling, numbness or tingling. Ambulatory in triage.

## 2018-11-28 ENCOUNTER — Emergency Department (HOSPITAL_BASED_OUTPATIENT_CLINIC_OR_DEPARTMENT_OTHER)
Admit: 2018-11-28 | Discharge: 2018-11-28 | Disposition: A | Discharge status: Home / Self Care | Payer: Medicare HMO | Attending: Emergency Medicine | Admitting: Emergency Medicine

## 2018-11-28 ENCOUNTER — Ambulatory Visit (HOSPITAL_COMMUNITY): Admission: RE | Admit: 2018-11-28 | Payer: Medicare HMO | Source: Ambulatory Visit

## 2018-11-28 ENCOUNTER — Emergency Department (HOSPITAL_COMMUNITY)
Admission: EM | Admit: 2018-11-28 | Discharge: 2018-11-28 | Discharge status: Absent without leave | Payer: Medicare HMO | Source: Home / Self Care

## 2018-11-28 DIAGNOSIS — M79604 Pain in right leg: Secondary | ICD-10-CM

## 2018-11-28 DIAGNOSIS — M79609 Pain in unspecified limb: Secondary | ICD-10-CM

## 2018-11-28 DIAGNOSIS — Z86718 Personal history of other venous thrombosis and embolism: Secondary | ICD-10-CM

## 2018-11-28 MED ORDER — OXYCODONE-ACETAMINOPHEN 5-325 MG PO TABS
2.0000 | ORAL_TABLET | Freq: Once | ORAL | Status: AC
  Administered 2018-11-28: 2 via ORAL
  Filled 2018-11-28: qty 2

## 2018-11-28 NOTE — ED Provider Notes (Signed)
TIME SEEN: 12:41 AM  CHIEF COMPLAINT: Right leg pain  HPI: Patient is a 68 year old female with history of CAD status post CABG, COPD, DVT, hypertension, hyperlipidemia, atrial fibrillation who is on Plavix as well as Eliquis and reports compliance with these medications who presents today with 1 week of right lower extremity pain.  States pain is throughout the entire right leg but mostly in the calf.  No injury.  No falls.  Able to ambulate.  Takes oxycodone at home for chronic back pain.  No chest pain or shortness of breath.  No fever.  No skin rash or discoloration.  Has not talked to her primary care doctor about the symptoms.  PCP is Dr. Josetta Huddle.  ROS: See HPI Constitutional: no fever  Eyes: no drainage  ENT: no runny nose   Cardiovascular:  no chest pain  Resp: no SOB  GI: no vomiting GU: no dysuria Integumentary: no rash  Allergy: no hives  Musculoskeletal: no leg swelling  Neurological: no slurred speech ROS otherwise negative  PAST MEDICAL HISTORY/PAST SURGICAL HISTORY:  Past Medical History:  Diagnosis Date  . Anemia   . Anginal pain ([place])    jaw pain 07/2016, s/p 07/15/16 nuclear stress test  . Anxiety   . Arthritis    "lower back" (08/24/2017)  . CAD (coronary artery disease), native coronary artery    a. Hx CABG 1998, last PCI 2013 b.LHC 07/2013:  EF 55-60%, L-LAD prob atretic, no sig disease in LAD, S-OM1 ok with patent stent, S-dRCA ok => med Rx. c. LHC 01/2015 DES to SVG-RCA.  Marland Kitchen Chronic back pain   . Chronic leg pain   . Chronic lower back pain   . COPD (chronic obstructive pulmonary disease) ([place])   . Depression   . DVT (deep venous thrombosis) ([facility]) X 3   RLE  . Fall 08/2016  . GERD (gastroesophageal reflux disease)   . Glucose intolerance (impaired glucose tolerance)   . Headache    "a few/month" (08/24/2017)  . HLD (hyperlipidemia)   . Hypertension   . Myocardial infarction ([place]) 1999  . OSA on CPAP   . PAD (peripheral artery disease) ([place])   .  PAF (paroxysmal atrial fibrillation) ([place])    a. 09/2013 post-op b. 01/2015  . Peripheral neuropathy   . Shortness of breath    when walking  . Wears glasses     MEDICATIONS:  Prior to Admission medications   Medication Sig Start Date End Date Taking? Authorizing Provider  albuterol (PROVENTIL HFA;VENTOLIN HFA) 108 (90 Base) MCG/ACT inhaler Inhale 2 puffs into the lungs every 6 (six) hours as needed for wheezing or shortness of breath. 01/01/18   Bhagat, Crista Luria, PA  ALPRAZolam (XANAX) 0.5 MG tablet Take 0.5 mg by mouth 3 (three) times daily as needed for anxiety.    [provider]  apixaban (ELIQUIS) 5 MG TABS tablet Take 1 tablet (5 mg total) by mouth 2 (two) times daily. 11/20/18   Nahser, Wonda Cheng, MD  atorvastatin (LIPITOR) 80 MG tablet TAKE ONE TABLET BY MOUTH DAILY AT [facility] 11/20/18   Nahser, Wonda Cheng, MD  buPROPion (WELLBUTRIN XL) 150 MG 24 hr tablet Take 150 mg by mouth daily.    [provider]  clopidogrel (PLAVIX) 75 MG tablet Take 75 mg by mouth daily.    [provider]  diclofenac sodium (VOLTAREN) 1 % GEL Apply 1 g topically daily as needed (pain).    [provider]  ezetimibe (ZETIA) 10 MG tablet  Take 10 mg by mouth daily.    [provider]  furosemide (LASIX) 20 MG tablet Take 1 tablet (20 mg total) by mouth daily as needed. 11/20/18 11/20/19  Nahser, Wonda Cheng, MD  isosorbide mononitrate (IMDUR) 60 MG 24 hr tablet Take 1.5 tablets (90 mg total) by mouth every morning. 11/20/18   Nahser, Wonda Cheng, MD  metoprolol tartrate (LOPRESSOR) 50 MG tablet Take 1 tablet (50 mg total) by mouth 2 (two) times daily. 11/20/18   Nahser, Wonda Cheng, MD  nitroGLYCERIN (NITROSTAT) 0.4 MG SL tablet Place 1 tablet (0.4 mg total) under the tongue every 5 (five) minutes as needed for chest pain. 11/20/18   Nahser, Wonda Cheng, MD  oxyCODONE (OXY IR/ROXICODONE) 5 MG immediate release tablet Take 1 tablet by mouth as needed. 12/25/17   [provider]   pantoprazole (PROTONIX) 40 MG tablet Take 1 tablet (40 mg total) by mouth daily. 11/20/18   Nahser, Wonda Cheng, MD  ranolazine (RANEXA) 1000 MG SR tablet Take 1,000 mg by mouth 2 (two) times daily.    [provider]  sertraline (ZOLOFT) 100 MG tablet Take 150 mg by mouth daily.     [provider]  triamcinolone cream (KENALOG) 0.1 % Apply 1 application topically as needed. 04/25/18   [provider]    ALLERGIES:  Allergies  Allergen Reactions  . Benadryl [Diphenhydramine] Shortness Of Breath  . Adhesive [Tape] Other (See Comments)    Burning of skin  . Amoxicillin Diarrhea and Other (See Comments)    Has patient had a PCN reaction causing immediate rash, facial/tongue/throat swelling, SOB or lightheadedness with hypotension: No Has patient had a PCN reaction causing severe rash involving mucus membranes or skin necrosis: No Has patient had a PCN reaction that required hospitalization No Has patient had a PCN reaction occurring within the last 10 years: Yes -- noted rxn as diarrhea If all of the above answers are "NO", then may proceed with Cephalosporin use.   . Cyclobenzaprine Other (See Comments)    Causes restless leg syndrome Restless syndrome  . Hydrocodone Itching    SOCIAL HISTORY:  Social History   Tobacco Use  . Smoking status: Former Smoker    Packs/day: 0.50    Years: 34.00    Pack years: 17.00    Types: Cigarettes    Quit date: 07/20/1998    Years since quitting: 20.3  . Smokeless tobacco: Never Used  Substance Use Topics  . Alcohol use: Not Currently    FAMILY HISTORY: Family History  Problem Relation Age of Onset  . Lung cancer Mother   . Clotting disorder Father   . Lung cancer Sister   . Heart disease Brother   . Heart disease Brother   . Colon cancer Neg Hx   . Breast cancer Neg Hx     EXAM: BP 114/67 (BP Location: Left Arm)   Pulse 65   Temp 97.9 F (36.6 C) (Oral)   Resp 18   Ht 5\' 1"  (1.549 m)   Wt 74.8 kg    SpO2 95%   BMI 31.18 kg/m  CONSTITUTIONAL: Alert and oriented and responds appropriately to questions. Well-appearing; well-nourished HEAD: Normocephalic EYES: Conjunctivae clear, pupils appear equal, EOMI ENT: normal nose; moist mucous membranes NECK: Supple, no meningismus, no nuchal rigidity, no LAD  CARD: RRR; S1 and S2 appreciated; no murmurs, no clicks, no rubs, no gallops RESP: Normal chest excursion without splinting or tachypnea; breath sounds clear and equal bilaterally; no wheezes, no  rhonchi, no rales, no hypoxia or respiratory distress, speaking full sentences ABD/GI: Normal bowel sounds; non-distended; soft, non-tender, no rebound, no guarding, no peritoneal signs, no hepatosplenomegaly BACK:  The back appears normal and is non-tender to palpation, there is no CVA tenderness EXT: Normal ROM in all joints; tender to palpation in the posterior right calf.  There is no asymmetry between either leg.  There are 2+ DP pulses bilaterally.  No redness, warmth, ecchymosis, necrosis, crepitus.  Compartments are soft.  Full range of motion in all joints.  No joint effusion.  No bony tenderness or bony deformity. SKIN: Normal color for age and race; warm; no rash NEURO: Moves all extremities equally PSYCH: The patient's mood and manner are appropriate. Grooming and personal hygiene are appropriate.  MEDICAL DECISION MAKING: Patient here with complaints of right leg pain that she feels is similar to when she has had a previous DVT.  She is on Plavix and Eliquis and reports compliance.  No chest pain or shortness of breath.  On exam there is no sign of septic arthritis, gout, cellulitis, compartment syndrome.  No history of injury to suggest fracture.  She has strong pulses in both lower extremities.  Recommended venous Doppler in the morning.  I do not feel she needs therapeutic dose of Lovenox given she is compliant with Eliquis and she agrees with this plan.  Will give dose of her oxycodone here  prior to discharge home.  Will schedule Doppler for the morning.  She will follow-up with her PCP.  At this time, I do not feel there is any life-threatening condition present. I have reviewed and discussed all results (EKG, imaging, lab, urine as appropriate) and exam findings with patient/family. I have reviewed nursing notes and appropriate previous records.  I feel the patient is safe to be discharged home without further emergent workup and can continue workup as an outpatient as needed. Discussed usual and customary return precautions. Patient/family verbalize understanding and are comfortable with this plan.  Outpatient follow-up has been provided as needed. All questions have been answered.      Joby Richart, Delice Bison, DO 11/28/18 0110

## 2018-11-28 NOTE — ED Notes (Signed)
Patient verbalized understanding of discharge instructions. Opportunity for questions were provided. Pt. ambulatory and discharged home.  

## 2018-11-28 NOTE — Discharge Instructions (Signed)
Please return to the [facility] emergency department as instructed on these discharge papers for venous doppler of your right lower extremity.  Please continue your Eliquis as prescribed.  If you develop chest pain, shortness of breath, please return to the emergency department.  I recommend follow-up with your primary care physician.

## 2018-11-29 NOTE — Progress Notes (Deleted)
Cardiology Office Note:    Date:  11/29/2018   ID:  Wendy [name], DOB [birth year], MRN [id number]  PCP:  Josetta Huddle, MD  Cardiologist:  [name], MD *** Electrophysiologist:  None   Referring MD: Josetta Huddle, MD   No chief complaint on file. ***  History of Present Illness:    Wendy [name] is a 68 y.o. female with ***  Coronary artery disease   S/p CABG in 1998  S/p PCI in 2013  S/p DES to [facility] 01/2015  Cath 5/18 - L-LAD known to be atretic; S-RCA, S-OM1 occluded; LAD patent with mid 50 >> Med Rx  Heart failure with preserved ejection fraction   Echocardiogram 5/18: EF 60-65  Paroxysmal atrial fibrillation   CHADS2-VASc=5 (female, age x 1, HTN, CAD, CHF) >> Eliquis  Hypertension   Hyperlipidemia   DJD  Polysubstance abuse   Wendy [name] was last seen in clinic by Fabian Sharp, PA-C in 10/2018 after a trip to the ED with near syncope in the setting of hypotension.  She recently went to the ED.    ***The DICTATELATER SmartLink is not supported in this context.   Prior CV studies:   The following studies were reviewed today:  *** Cardiac catheterization 07/25/16 LAD mid 50 LCx dist 75; OM1 100, stent 100; OM2 100 RCA prox 100 S-RCA 100, stent patent w/ mid 75 S-OM1 100 L-LAD known to be atretic EF 25-35    Echocardiogram 07/23/16 EF 60-65, no RWMA, MAC, trivial TR, PASP 24  Myoview 07/15/16  Nuclear stress EF: 48%.  EKG nondiagnostic due to baseline changes  Very small region of distal anterior ischemia, cannot exclude shifting soft tissue Inferior / inferolateral defect consistent with soft tissue attenuation and/or scar  Intermediate risk study.    Past Medical History:  Diagnosis Date  . Anemia   . Anginal pain ([place])    jaw pain 07/2016, s/p 07/15/16 nuclear stress test  . Anxiety   . Arthritis    "lower back" (08/24/2017)  . CAD (coronary artery disease), native coronary artery    a. Hx CABG 1998, last PCI 2013 b.LHC  07/2013:  EF 55-60%, L-LAD prob atretic, no sig disease in LAD, S-OM1 ok with patent stent, S-dRCA ok => med Rx. c. LHC 01/2015 DES to SVG-RCA.  Marland Kitchen Chronic back pain   . Chronic leg pain   . Chronic lower back pain   . COPD (chronic obstructive pulmonary disease) ([place])   . Depression   . DVT (deep venous thrombosis) ([facility]) X 3   RLE  . Fall 08/2016  . GERD (gastroesophageal reflux disease)   . Glucose intolerance (impaired glucose tolerance)   . Headache    "a few/month" (08/24/2017)  . HLD (hyperlipidemia)   . Hypertension   . Myocardial infarction ([place]) 1999  . OSA on CPAP   . PAD (peripheral artery disease) ([place])   . PAF (paroxysmal atrial fibrillation) ([place])    a. 09/2013 post-op b. 01/2015  . Peripheral neuropathy   . Shortness of breath    when walking  . Wears glasses    Surgical Hx: The patient  has a past surgical history that includes Knee arthroscopy (Right, 1998); Finger surgery (Right); ORIF humerus fracture (Left, 08/27/2013); ERCP (N/A, 09/19/2013); Irrigation and debridement abscess (Right, 10/17/2013); left heart catheterization with coronary/graft angiogram (N/A, 07/19/2013); Mass excision (Right, 06/25/2014); Total hip arthroplasty (Right, 07/19/2016); LEFT HEART CATH AND CORS/GRAFTS ANGIOGRAPHY (N/A, 07/25/2016); Fracture surgery; Joint replacement; Cholecystectomy (N/A, 09/16/2013); Anterior cervical  decomp/discectomy fusion (1991); Posterior fusion cervical spine (1992); Back surgery; Coronary artery bypass graft (1998); Thrombectomy / embolectomy femoral artery (Right); Coronary angioplasty with stent (11/2011, 02/2012, 01/2015); Cardiac catheterization (N/A, 01/22/2015); Cardiac catheterization (N/A, 01/22/2015); Cardiac catheterization (03/09/2000); and Total hip revision (Right, 08/22/2017).   Current Medications: No outpatient medications have been marked as taking for the 11/30/18 encounter (Appointment) with Richardson Dopp T, PA-C.   Current Facility-Administered  Medications for the 11/30/18 encounter (Appointment) with Liliane Shi, PA-C  Medication  . ferrous sulfate tablet 325 mg     Allergies:   Benadryl [diphenhydramine], Adhesive [tape], Amoxicillin, Cyclobenzaprine, and Hydrocodone   Social History   Tobacco Use  . Smoking status: Former Smoker    Packs/day: 0.50    Years: 34.00    Pack years: 17.00    Types: Cigarettes    Quit date: 07/20/1998    Years since quitting: 20.3  . Smokeless tobacco: Never Used  Substance Use Topics  . Alcohol use: Not Currently  . Drug use: Not Currently    Types: Marijuana     Family Hx: The patient's family history includes Clotting disorder in her father; Heart disease in her brother and brother; Lung cancer in her mother and sister. There is no history of Colon cancer or Breast cancer.  ROS:   Please see the history of present illness.    ROS All other systems reviewed and are negative.   EKGs/Labs/Other Test Reviewed:    EKG:  EKG is *** ordered today.  The ekg ordered today demonstrates ***  Recent Labs: 09/13/2018: BUN 11; Creatinine, Ser 0.90; Hemoglobin 11.9; Platelets 211; Potassium 4.1; Sodium 143   Recent Lipid Panel Lab Results  Component Value Date/Time   CHOL 98 (L) 12/05/2016 11:39 AM   TRIG 73 12/05/2016 11:39 AM   HDL 43 12/05/2016 11:39 AM   CHOLHDL 2.3 12/05/2016 11:39 AM   CHOLHDL 4.1 08/13/2015 09:39 AM   LDLCALC 40 12/05/2016 11:39 AM    Physical Exam:    VS:  There were no vitals taken for this visit.    Wt Readings from Last 3 Encounters:  11/28/18 165 lb (74.8 kg)  10/09/18 168 lb 9.6 oz (76.5 kg)  08/21/18 175 lb (79.4 kg)     ***Physical Exam  ASSESSMENT & PLAN:    No diagnosis found.***  Dispo:  No follow-ups on file.   Medication Adjustments/Labs and Tests Ordered: Current medicines are reviewed at length with the patient today.  Concerns regarding medicines are outlined above.  Tests Ordered: No orders of the defined types were placed in  this encounter.  Medication Changes: No orders of the defined types were placed in this encounter.   Signed, Richardson Dopp, PA-C  11/29/2018 9:34 PM    [facility] [address], [place], [place]  [address] Phone: [phone]; Fax: [phone]

## 2018-11-30 ENCOUNTER — Ambulatory Visit: Payer: Medicare HMO | Admitting: Physician Assistant

## 2018-12-06 IMAGING — NM NM MISC PROCEDURE
9 series · 54 of 54 positions shown · non-contrast
Comparison: none

[Series 1: wbr rest · 6.40mm/px · 6 of 64 frames shown]
[frame 6/64]
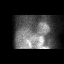
[frame 16/64]
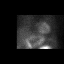
[frame 27/64]
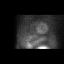
[frame 38/64]
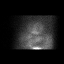
[frame 48/64]
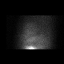
[frame 59/64]
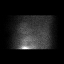

[Series 1: rest sax · 6.4mm · 6.40mm/px · 6 of 20 frames shown]
[frame 2/20]
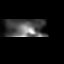
[frame 5/20]
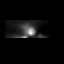
[frame 9/20]
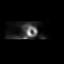
[frame 12/20]
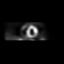
[frame 15/20]
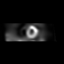
[frame 19/20]
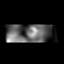

[Series 1: wbr_r-proj_st wbr rest · 6.40mm/px · 6 of 64 frames shown]
[frame 6/64]
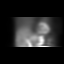
[frame 16/64]
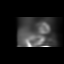
[frame 27/64]
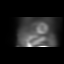
[frame 38/64]
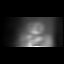
[frame 48/64]
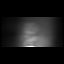
[frame 59/64]
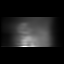

[Series 2: stress sax · 6.4mm · 6.40mm/px · 6 of 19 frames shown]
[frame 2/19]
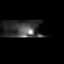
[frame 5/19]
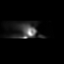
[frame 8/19]
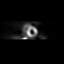
[frame 11/19]
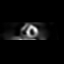
[frame 14/19]
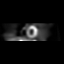
[frame 18/19]
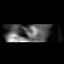

[Series 2: wbr_s-proj_st wbr stress-gsp · 6.40mm/px · 6 of 512 frames shown]
[frame 43/512]
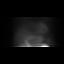
[frame 128/512]
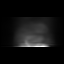
[frame 214/512]
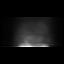
[frame 299/512]
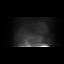
[frame 384/512]
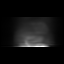
[frame 470/512]
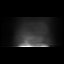

[Series 2: wbr stress-gsp · 6.40mm/px · 6 of 512 frames shown]
[frame 43/512]
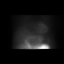
[frame 128/512]
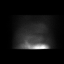
[frame 214/512]
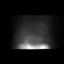
[frame 299/512]
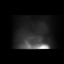
[frame 384/512]
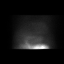
[frame 470/512]
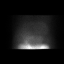

[Series 2: stress sax gs · 6.4mm · 6.40mm/px · 6 of 152 frames shown]
[frame 13/152]
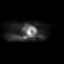
[frame 38/152]
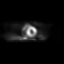
[frame 64/152]
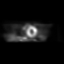
[frame 89/152]
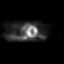
[frame 114/152]
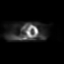
[frame 140/152]
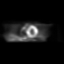

[Series 3: wbr stress-sum-em · 6.40mm/px · 6 of 64 frames shown]
[frame 6/64]
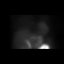
[frame 16/64]
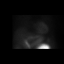
[frame 27/64]
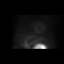
[frame 38/64]
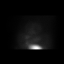
[frame 48/64]
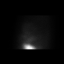
[frame 59/64]
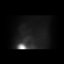

[Series 3: wbr_s-proj_st wbr stress-sum-em · 6.40mm/px · 6 of 64 frames shown]
[frame 6/64]
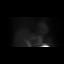
[frame 16/64]
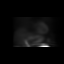
[frame 27/64]
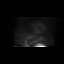
[frame 38/64]
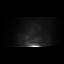
[frame 48/64]
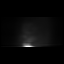
[frame 59/64]
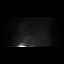

[54 of 54 positions shown; findings below may reference images not displayed]

Canned report from images found in remote index.

Refer to host system for actual result text.

## 2018-12-10 IMAGING — RF DG C-ARM 61-120 MIN
1 series · 2 of 2 positions shown · non-contrast
Comparison: MRI 12/30/2015 .

CLINICAL DATA: Right hip replacement.

EXAM:
DG C-ARM 61-120 MIN; OPERATIVE RIGHT HIP WITH PELVIS

[Series 1: run · 2 of 2 slices shown]
[im 1/2]
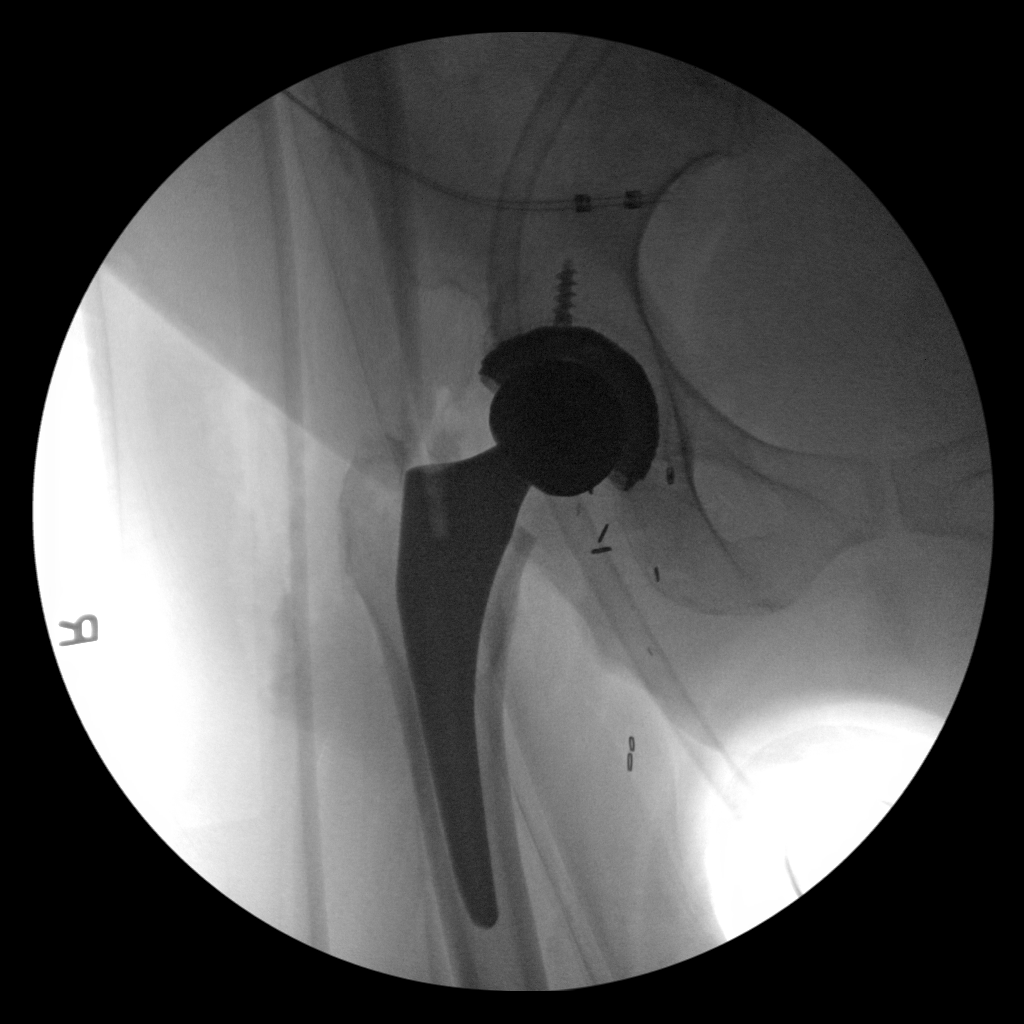
[im 2/2]
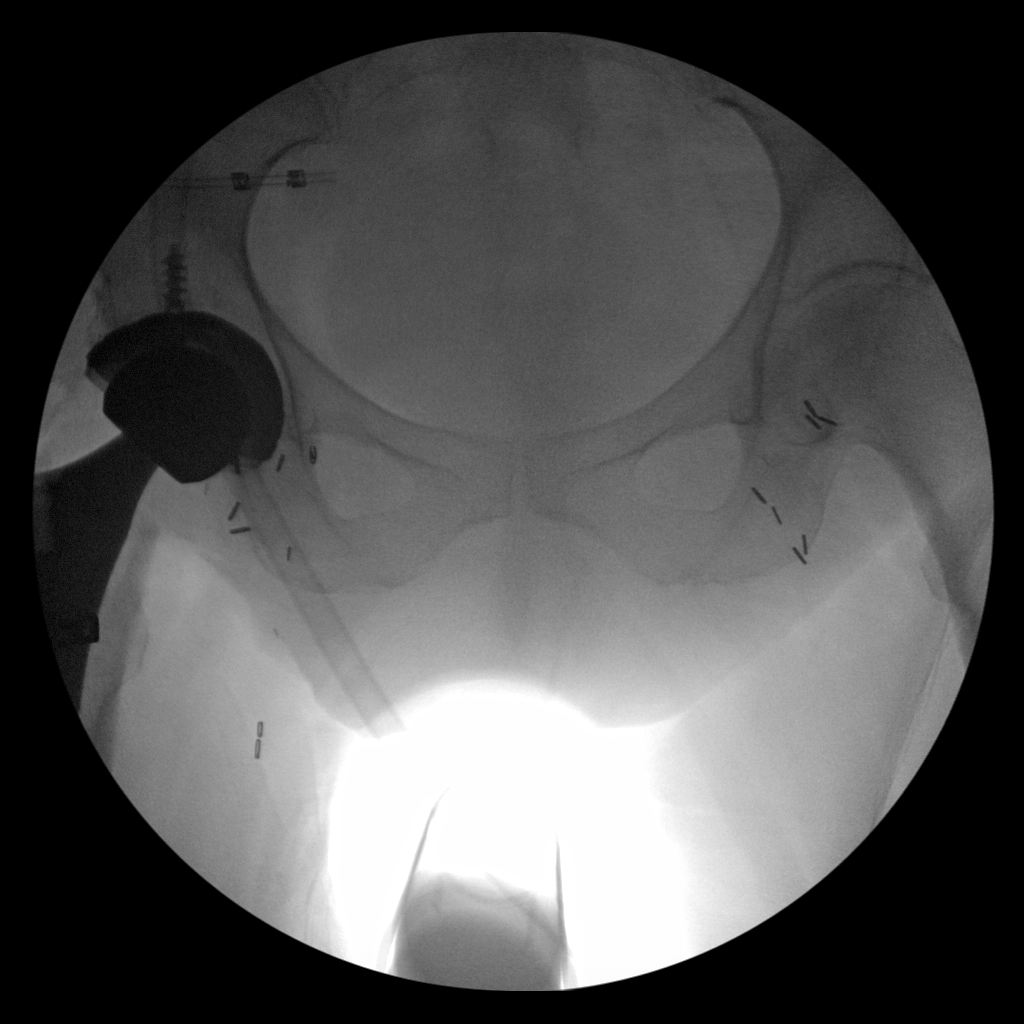

[2 of 2 positions shown; findings below may reference images not displayed]

FINDINGS: Total right hip replacement.  Hardware intact.  Anatomic alignment.
IMPRESSION: Total right hip replacement with anatomic alignment.

## 2018-12-11 DIAGNOSIS — J449 Chronic obstructive pulmonary disease, unspecified: Secondary | ICD-10-CM | POA: Diagnosis not present

## 2018-12-11 DIAGNOSIS — E785 Hyperlipidemia, unspecified: Secondary | ICD-10-CM | POA: Diagnosis not present

## 2018-12-11 DIAGNOSIS — M549 Dorsalgia, unspecified: Secondary | ICD-10-CM | POA: Diagnosis not present

## 2018-12-11 DIAGNOSIS — Z23 Encounter for immunization: Secondary | ICD-10-CM | POA: Diagnosis not present

## 2018-12-11 DIAGNOSIS — F322 Major depressive disorder, single episode, severe without psychotic features: Secondary | ICD-10-CM | POA: Diagnosis not present

## 2018-12-11 DIAGNOSIS — I480 Paroxysmal atrial fibrillation: Secondary | ICD-10-CM | POA: Diagnosis not present

## 2018-12-11 DIAGNOSIS — I10 Essential (primary) hypertension: Secondary | ICD-10-CM | POA: Diagnosis not present

## 2018-12-11 DIAGNOSIS — R1310 Dysphagia, unspecified: Secondary | ICD-10-CM | POA: Diagnosis not present

## 2018-12-11 DIAGNOSIS — I25810 Atherosclerosis of coronary artery bypass graft(s) without angina pectoris: Secondary | ICD-10-CM | POA: Diagnosis not present

## 2018-12-13 IMAGING — DX DG CHEST 1V PORT
1 series · 1 of 1 positions shown · non-contrast
Comparison: 01/21/2015

CLINICAL DATA: Chest pain

EXAM:
PORTABLE CHEST 1 VIEW

[chest ap]
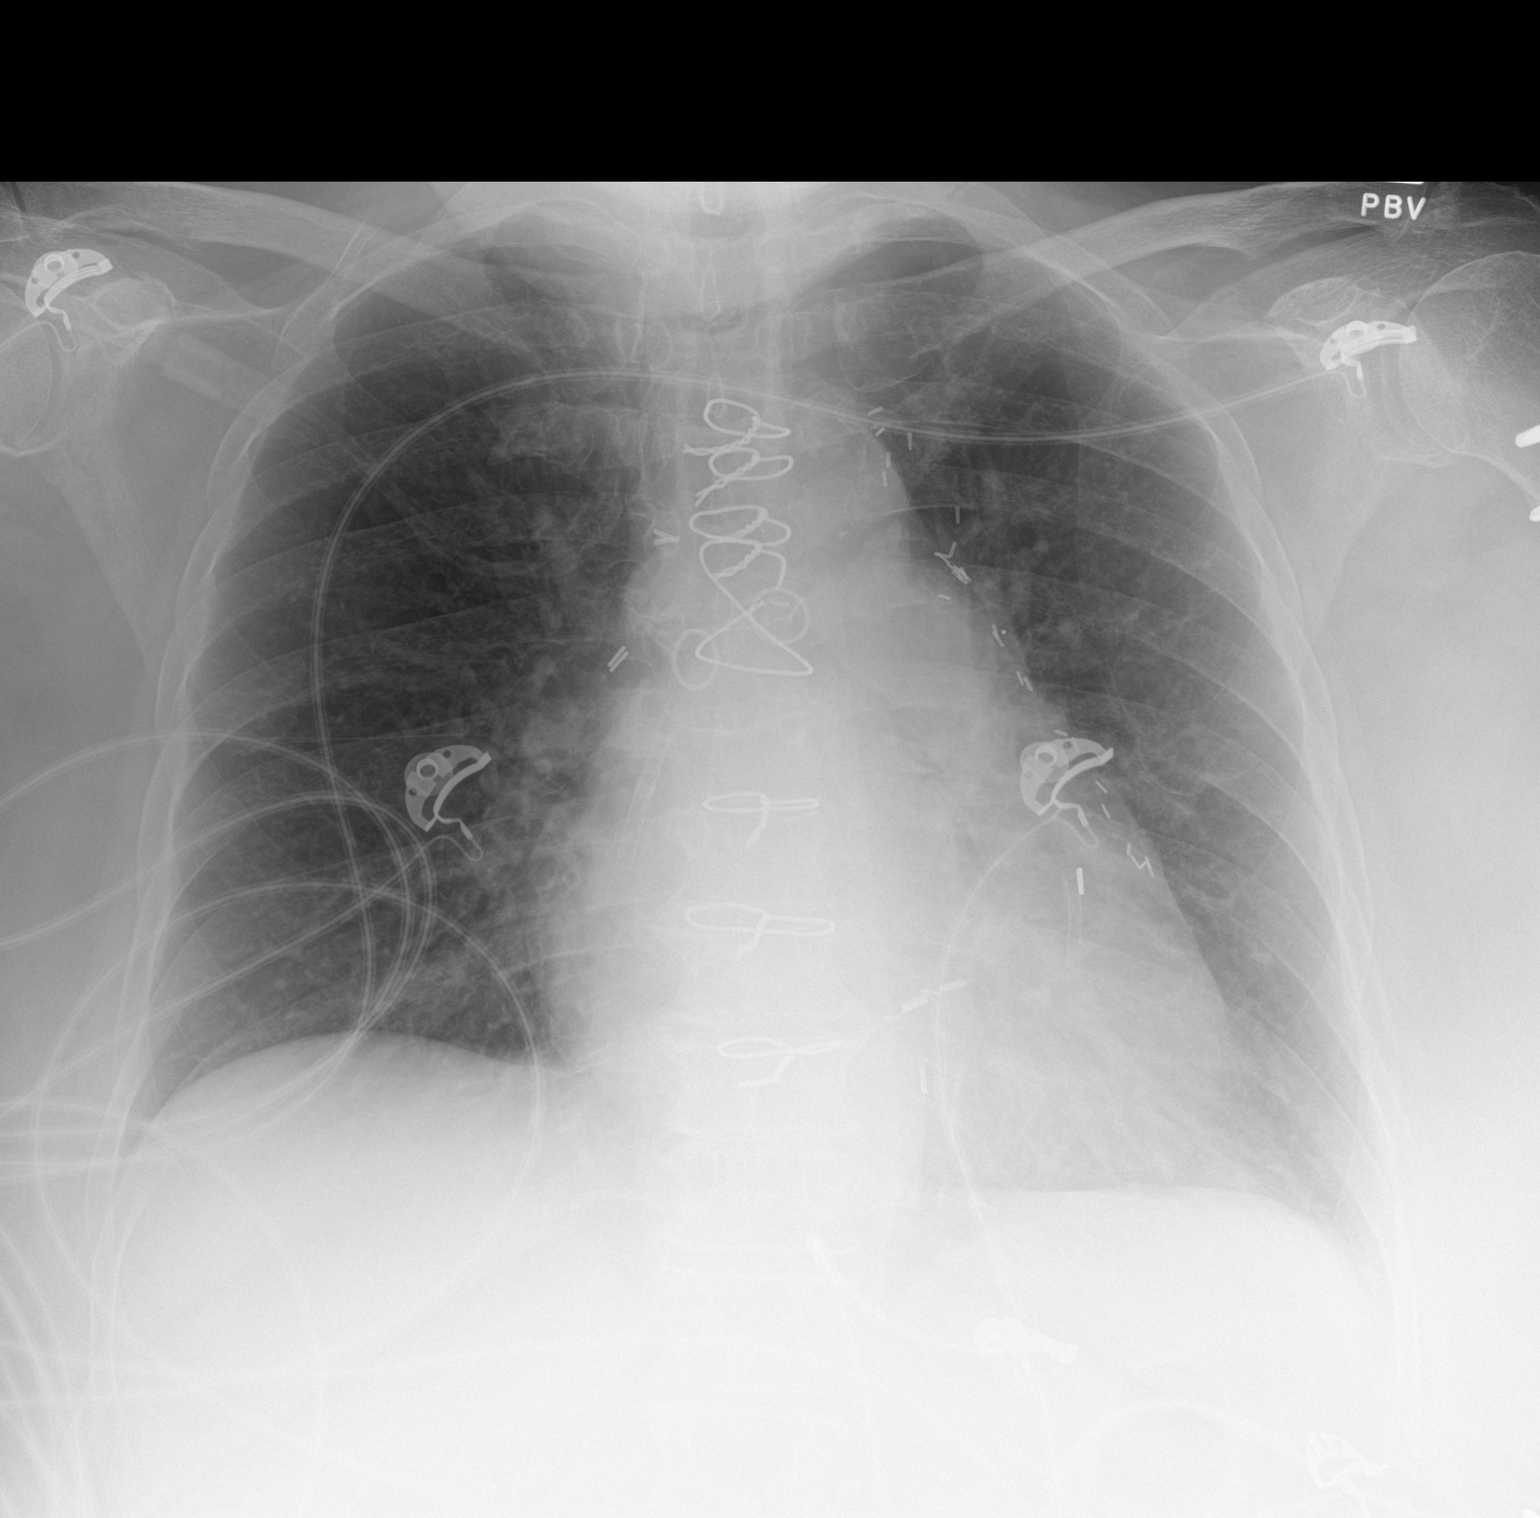

[1 of 1 positions shown; findings below may reference images not displayed]

FINDINGS: Borderline cardiomegaly. The patient is status post CABG and
coronary stenting. There is no edema, consolidation, effusion, or
pneumothorax. No acute osseous finding.
IMPRESSION: No acute finding.

## 2018-12-31 DIAGNOSIS — I208 Other forms of angina pectoris: Secondary | ICD-10-CM | POA: Diagnosis not present

## 2018-12-31 DIAGNOSIS — I480 Paroxysmal atrial fibrillation: Secondary | ICD-10-CM | POA: Diagnosis not present

## 2018-12-31 DIAGNOSIS — I10 Essential (primary) hypertension: Secondary | ICD-10-CM | POA: Diagnosis not present

## 2018-12-31 DIAGNOSIS — E785 Hyperlipidemia, unspecified: Secondary | ICD-10-CM | POA: Diagnosis not present

## 2018-12-31 DIAGNOSIS — I25810 Atherosclerosis of coronary artery bypass graft(s) without angina pectoris: Secondary | ICD-10-CM | POA: Diagnosis not present

## 2018-12-31 DIAGNOSIS — J449 Chronic obstructive pulmonary disease, unspecified: Secondary | ICD-10-CM | POA: Diagnosis not present

## 2018-12-31 DIAGNOSIS — F322 Major depressive disorder, single episode, severe without psychotic features: Secondary | ICD-10-CM | POA: Diagnosis not present

## 2019-01-11 ENCOUNTER — Ambulatory Visit: Payer: Medicare HMO | Admitting: Cardiovascular Disease

## 2019-01-30 DIAGNOSIS — F322 Major depressive disorder, single episode, severe without psychotic features: Secondary | ICD-10-CM | POA: Diagnosis not present

## 2019-01-30 DIAGNOSIS — E785 Hyperlipidemia, unspecified: Secondary | ICD-10-CM | POA: Diagnosis not present

## 2019-01-30 DIAGNOSIS — I480 Paroxysmal atrial fibrillation: Secondary | ICD-10-CM | POA: Diagnosis not present

## 2019-01-30 DIAGNOSIS — J449 Chronic obstructive pulmonary disease, unspecified: Secondary | ICD-10-CM | POA: Diagnosis not present

## 2019-01-30 DIAGNOSIS — I208 Other forms of angina pectoris: Secondary | ICD-10-CM | POA: Diagnosis not present

## 2019-01-30 DIAGNOSIS — I10 Essential (primary) hypertension: Secondary | ICD-10-CM | POA: Diagnosis not present

## 2019-01-30 DIAGNOSIS — I25810 Atherosclerosis of coronary artery bypass graft(s) without angina pectoris: Secondary | ICD-10-CM | POA: Diagnosis not present

## 2019-02-07 ENCOUNTER — Encounter (HOSPITAL_COMMUNITY): Payer: Self-pay | Admitting: Emergency Medicine

## 2019-02-07 ENCOUNTER — Other Ambulatory Visit: Payer: Self-pay

## 2019-02-07 ENCOUNTER — Emergency Department (HOSPITAL_COMMUNITY): Payer: Medicare HMO

## 2019-02-07 ENCOUNTER — Emergency Department (HOSPITAL_COMMUNITY)
Admission: EM | Admit: 2019-02-07 | Discharge: 2019-02-07 | Disposition: A | Discharge status: Home / Self Care | Payer: Medicare HMO | Attending: Emergency Medicine | Admitting: Emergency Medicine

## 2019-02-07 DIAGNOSIS — I10 Essential (primary) hypertension: Secondary | ICD-10-CM | POA: Diagnosis not present

## 2019-02-07 DIAGNOSIS — Z7901 Long term (current) use of anticoagulants: Secondary | ICD-10-CM | POA: Diagnosis not present

## 2019-02-07 DIAGNOSIS — Z79899 Other long term (current) drug therapy: Secondary | ICD-10-CM | POA: Diagnosis not present

## 2019-02-07 DIAGNOSIS — S0232XA Fracture of orbital floor, left side, initial encounter for closed fracture: Secondary | ICD-10-CM | POA: Insufficient documentation

## 2019-02-07 DIAGNOSIS — Z955 Presence of coronary angioplasty implant and graft: Secondary | ICD-10-CM | POA: Diagnosis not present

## 2019-02-07 DIAGNOSIS — W010XXA Fall on same level from slipping, tripping and stumbling without subsequent striking against object, initial encounter: Secondary | ICD-10-CM | POA: Diagnosis not present

## 2019-02-07 DIAGNOSIS — S0990XA Unspecified injury of head, initial encounter: Secondary | ICD-10-CM | POA: Diagnosis not present

## 2019-02-07 DIAGNOSIS — S0592XA Unspecified injury of left eye and orbit, initial encounter: Secondary | ICD-10-CM | POA: Diagnosis present

## 2019-02-07 DIAGNOSIS — Y929 Unspecified place or not applicable: Secondary | ICD-10-CM | POA: Diagnosis not present

## 2019-02-07 DIAGNOSIS — Z87891 Personal history of nicotine dependence: Secondary | ICD-10-CM | POA: Diagnosis not present

## 2019-02-07 DIAGNOSIS — I2510 Atherosclerotic heart disease of native coronary artery without angina pectoris: Secondary | ICD-10-CM | POA: Insufficient documentation

## 2019-02-07 DIAGNOSIS — Z96641 Presence of right artificial hip joint: Secondary | ICD-10-CM | POA: Insufficient documentation

## 2019-02-07 DIAGNOSIS — I252 Old myocardial infarction: Secondary | ICD-10-CM | POA: Diagnosis not present

## 2019-02-07 DIAGNOSIS — Y9301 Activity, walking, marching and hiking: Secondary | ICD-10-CM | POA: Diagnosis not present

## 2019-02-07 DIAGNOSIS — S02401A Maxillary fracture, unspecified, initial encounter for closed fracture: Secondary | ICD-10-CM | POA: Diagnosis not present

## 2019-02-07 DIAGNOSIS — Y999 Unspecified external cause status: Secondary | ICD-10-CM | POA: Diagnosis not present

## 2019-02-07 LAB — BASIC METABOLIC PANEL
Anion gap: 9 (ref 5–15)
BUN: 12 mg/dL (ref 8–23)
CO2: 27 mmol/L (ref 22–32)
Calcium: 9.2 mg/dL (ref 8.9–10.3)
Chloride: 107 mmol/L (ref 98–111)
Creatinine, Ser: 0.83 mg/dL (ref 0.44–1.00)
GFR calc Af Amer: >60 mL/min (ref >60)
GFR calc non Af Amer: >60 mL/min (ref >60)
Glucose, Bld: 103 mg/dL — ABNORMAL HIGH (ref 70–99)
Potassium: 4.5 mmol/L (ref 3.5–5.1)
Sodium: 143 mmol/L (ref 135–145)

## 2019-02-07 LAB — CBC
HCT: 36.5 % (ref 36.0–46.0)
Hemoglobin: 10.9 g/dL — ABNORMAL LOW (ref 12.0–15.0)
MCH: 28.7 pg (ref 26.0–34.0)
MCHC: 29.9 g/dL — ABNORMAL LOW (ref 30.0–36.0)
MCV: 96.1 fL (ref 80.0–100.0)
Platelets: 208 10*3/uL (ref 150–400)
RBC: 3.8 MIL/uL — ABNORMAL LOW (ref 3.87–5.11)
RDW: 15.3 % (ref 11.5–15.5)
WBC: 5.9 10*3/uL (ref 4.0–10.5)
nRBC: 0 % (ref 0.0–0.2)

## 2019-02-07 LAB — CBG MONITORING, ED: Glucose-Capillary: 106 mg/dL — ABNORMAL HIGH (ref 70–99)

## 2019-02-07 MED ORDER — SODIUM CHLORIDE 0.9% FLUSH: 3.0000 mL | Freq: Once | INTRAVENOUS | Status: DC

## 2019-02-07 MED ORDER — OXYCODONE-ACETAMINOPHEN 5-325 MG PO TABS: 2.0000 | ORAL_TABLET | ORAL | 0 refills | Status: DC | PRN

## 2019-02-07 MED ORDER — MORPHINE SULFATE (PF) 4 MG/ML IV SOLN
4.0000 mg | Freq: Once | INTRAVENOUS | Status: AC
  Administered 2019-02-07: 16:00:00 4 mg via INTRAVENOUS
  Filled 2019-02-07: qty 1

## 2019-02-07 NOTE — ED Triage Notes (Addendum)
Pt states she fell on Tuesday.  Denies LOC.  Unsure why she fell.  Denies tripping/slipping.  Denies dizziness.  C/o pain, swelling, and bruising to L eye with small laceration above L lateral eye.Marland Kitchen  Also c/o L sided head pain and R elbow pain.   Pt takes Eliquis.

## 2019-02-07 NOTE — ED Provider Notes (Signed)
[facility] EMERGENCY DEPARTMENT Provider Note   CSN: [id number] Arrival date & time: 02/07/19  1406     History   Chief Complaint Chief Complaint  Patient presents with   Fall   Eye Injury    HPI NEYDI GEHLING is a 68 y.o. female with PMH significant for COPD, PAD, HTN, CAD, OSA, DVT, paroxysmal A. fib, arthritis, peripheral neuropathy who presents to the ED after sustaining a mechanical fall 2 days ago.  Patient reports that she was walking on uneven pavement and, given her neuropathy, tripped and fell on the left side of her face.  She remembers the event in its entirety and did not lose consciousness.  She complains of temporal and parietal discomfort on left side of face in addition to swollen and painful left eye.  She also reports pain and numbness to left side of upper lip.  She initially did not want to come to the ED, but she reports that her nurse made her.  Patient is on Eliquis.  She denies any hearing loss, dizziness, chest pain or shortness of breath, abdominal discomfort, nausea vomiting, or extremity weakness, numbness, or tingling.     HPI  Past Medical History:  Diagnosis Date   Anemia    Anginal pain ([place])    jaw pain 07/2016, s/p 07/15/16 nuclear stress test   Anxiety    Arthritis    "lower back" (08/24/2017)   CAD (coronary artery disease), native coronary artery    a. Hx CABG 1998, last PCI 2013 b.LHC 07/2013:  EF 55-60%, L-LAD prob atretic, no sig disease in LAD, S-OM1 ok with patent stent, S-dRCA ok => med Rx. c. LHC 01/2015 DES to SVG-RCA.   Chronic back pain    Chronic leg pain    Chronic lower back pain    COPD (chronic obstructive pulmonary disease) (HCC)    Depression    DVT (deep venous thrombosis) ([place]) X 3   RLE   Fall 08/2016   GERD (gastroesophageal reflux disease)    Glucose intolerance (impaired glucose tolerance)    Headache    "a few/month" (08/24/2017)   HLD (hyperlipidemia)    Hypertension     Myocardial infarction ([place]) 1999   OSA on CPAP    PAD (peripheral artery disease) (HCC)    PAF (paroxysmal atrial fibrillation) ([place])    a. 09/2013 post-op b. 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/2017   Acute combined systolic and diastolic heart failure ([place]) 07/26/2016   Troponin level elevated 07/26/2016   Chronic anticoagulation 07/22/2016   CAD S/P multiple PCIs-last one 2016 07/22/2016   NSTEMI (non-ST elevated myocardial infarction) ([place]) 07/22/2016   Primary osteoarthritis of right hip 06/27/2016   Avascular necrosis of hip, right ([place]) 06/27/2016   PVD (peripheral vascular disease) ([place])    Essential (primary) hypertension 01/14/2015   Coronary artery disease involving native coronary artery of native heart without angina pectoris 10/06/2014   Acute angina ([place]) 05/12/2014   Epigastric pain    Chest pain with high risk of acute coronary syndrome 10/21/2013   Acute respiratory failure ([place]) 09/20/2013   Nonspecific abnormal results of liver function study 09/17/2013   Cholecystitis, acute 09/15/2013   PAD (peripheral artery disease) (HCC)    COPD (chronic obstructive pulmonary disease) ([place])  Peripheral neuropathy    Chronic back pain    Sleep apnea    Anxiety    GERD (gastroesophageal reflux disease)    Nonunion, fracture 08/27/2013   Hypertension 07/20/2013   HLD (hyperlipidemia) 07/20/2013   Unstable angina ([place]) 07/19/2013   Hx of CABG 07/19/2013    Past Surgical History:  Procedure Laterality Date   ANTERIOR CERVICAL DECOMP/DISCECTOMY FUSION  1991   BACK SURGERY     CARDIAC CATHETERIZATION N/A 01/22/2015   Procedure: Left Heart Cath and Coronary Angiography;  Surgeon: Troy Sine, MD;  Location: [facility];  Service: Cardiovascular;  Laterality:  N/A;   CARDIAC CATHETERIZATION N/A 01/22/2015   Procedure: Coronary Stent Intervention;  Surgeon: Troy Sine, MD;  Location: [facility];  Service: Cardiovascular;  Laterality: N/A;  3.0x12 xience prox SVG to RCA   CARDIAC CATHETERIZATION  03/09/2000   hx/notes 03/20/2000   CHOLECYSTECTOMY N/A 09/16/2013   Procedure: LAPAROSCOPIC CHOLECYSTECTOMY CONVERTED TO OPEN CHOLECYSTECTOMY WITH CHOLANGIOGRAM;  Surgeon: [name], MD;  Location: [facility];  Service: General;  Laterality: N/A;   CORONARY ANGIOPLASTY WITH STENT PLACEMENT  11/2011, 02/2012, 01/2015   DES to SVG-RCA both times, In [place], [place], Dr. Bruce Donath   CORONARY ARTERY BYPASS GRAFT  1998   LIMA-LAD, SVG-OM, SVG-RCA   ERCP N/A 09/19/2013   Procedure: ENDOSCOPIC RETROGRADE CHOLANGIOPANCREATOGRAPHY (ERCP);  Surgeon: [name], MD;  Location: [facility];  Service: Endoscopy;  Laterality: N/A;   FINGER SURGERY Right    ring finger "fused"   FRACTURE SURGERY     IRRIGATION AND DEBRIDEMENT ABSCESS Right 10/17/2013   Procedure: INCISION AND DRAINAGE RIGHT SUBCOSTAL WOUND;  Surgeon: [name], MD;  Location: [facility];  Service: General;  Laterality: Right;   JOINT REPLACEMENT     KNEE ARTHROSCOPY Right 1998   LEFT HEART CATH AND CORS/GRAFTS ANGIOGRAPHY N/A 07/25/2016   Procedure: Left Heart Cath and Cors/Grafts Angiography;  Surgeon: Jettie Booze, MD;  Location: [facility];  Service: Cardiovascular;  Laterality: N/A;   LEFT HEART CATHETERIZATION WITH CORONARY/GRAFT ANGIOGRAM N/A 07/19/2013   Procedure: LEFT HEART CATHETERIZATION WITH Beatrix Fetters;  Surgeon: Leonie Man, MD;  Location: [facility];  Service: Cardiovascular;  Laterality: N/A;   MASS EXCISION Right 06/25/2014   Procedure: EXCISION BUTTOCK MASS ;  Surgeon: Coralie Keens, MD;  Location: [facility];  Service: General;  Laterality: Right;   ORIF HUMERUS FRACTURE Left 08/27/2013   Procedure: OPEN  REDUCTION INTERNAL FIXATION (ORIF) LEFT HUMERUS ;  Surgeon: Rozanna Box, MD;  Location: [facility];  Service: Orthopedics;  Laterality: Left;   POSTERIOR FUSION CERVICAL SPINE  1992   THROMBECTOMY / EMBOLECTOMY FEMORAL ARTERY Right    hx/notes 03/20/2000   TOTAL HIP ARTHROPLASTY Right 07/19/2016   Procedure: TOTAL HIP ARTHROPLASTY ANTERIOR APPROACH;  Surgeon: [name], MD;  Location: [facility];  Service: Orthopedics;  Laterality: Right;   TOTAL HIP REVISION Right 08/22/2017   Procedure: TOTAL HIP REVISION;  Surgeon: [name], MD;  Location: [facility];  Service: Orthopedics;  Laterality: Right;     OB History   No obstetric history on file.      Home Medications    Prior to Admission medications   Medication Sig Start Date End Date Taking? Authorizing Provider  albuterol (PROVENTIL HFA;VENTOLIN HFA) 108 (90 Base) MCG/ACT inhaler Inhale 2 puffs into the lungs every 6 (six) hours as needed for wheezing or shortness of breath. 01/01/18  Bhagat, Bhavinkumar, PA  ALPRAZolam (XANAX) 0.5 MG tablet Take 0.5 mg by mouth 3 (three) times daily as needed for anxiety.    [provider]  apixaban (ELIQUIS) 5 MG TABS tablet Take 1 tablet (5 mg total) by mouth 2 (two) times daily. 11/20/18   Nahser, Wonda Cheng, MD  atorvastatin (LIPITOR) 80 MG tablet TAKE ONE TABLET BY MOUTH DAILY AT [facility] 11/20/18   Nahser, Wonda Cheng, MD  buPROPion (WELLBUTRIN XL) 150 MG 24 hr tablet Take 150 mg by mouth daily.    [provider]  clopidogrel (PLAVIX) 75 MG tablet Take 75 mg by mouth daily.    [provider]  diclofenac sodium (VOLTAREN) 1 % GEL Apply 1 g topically daily as needed (pain).    [provider]  ezetimibe (ZETIA) 10 MG tablet Take 10 mg by mouth daily.    [provider]  furosemide (LASIX) 20 MG tablet Take 1 tablet (20 mg total) by mouth daily as needed. 11/20/18 11/20/19  Nahser, Wonda Cheng, MD  isosorbide mononitrate (IMDUR) 60 MG 24 hr tablet Take 1.5  tablets (90 mg total) by mouth every morning. 11/20/18   Nahser, Wonda Cheng, MD  metoprolol tartrate (LOPRESSOR) 50 MG tablet Take 1 tablet (50 mg total) by mouth 2 (two) times daily. 11/20/18   Nahser, Wonda Cheng, MD  nitroGLYCERIN (NITROSTAT) 0.4 MG SL tablet Place 1 tablet (0.4 mg total) under the tongue every 5 (five) minutes as needed for chest pain. 11/20/18   Nahser, Wonda Cheng, MD  oxyCODONE (OXY IR/ROXICODONE) 5 MG immediate release tablet Take 1 tablet by mouth as needed. 12/25/17   [provider]  oxyCODONE-acetaminophen (PERCOCET/ROXICET) 5-325 MG tablet Take 2 tablets by mouth every 4 (four) hours as needed for severe pain. 02/07/19   Corena Herter, PA-C  pantoprazole (PROTONIX) 40 MG tablet Take 1 tablet (40 mg total) by mouth daily. 11/20/18   Nahser, Wonda Cheng, MD  ranolazine (RANEXA) 1000 MG SR tablet Take 1,000 mg by mouth 2 (two) times daily.    [provider]  sertraline (ZOLOFT) 100 MG tablet Take 150 mg by mouth daily.     [provider]  triamcinolone cream (KENALOG) 0.1 % Apply 1 application topically as needed. 04/25/18   [provider]    Family History Family History  Problem Relation Age of Onset   Lung cancer Mother    Clotting disorder Father    Lung cancer Sister    Heart disease Brother    Heart disease Brother    Colon cancer Neg Hx    Breast cancer Neg Hx     Social History Social History   Tobacco Use   Smoking status: Former Smoker    Packs/day: 0.50    Years: 34.00    Pack years: 17.00    Types: Cigarettes    Quit date: 07/20/1998    Years since quitting: 20.5   Smokeless tobacco: Never Used  Substance Use Topics   Alcohol use: Not Currently   Drug use: Not Currently    Types: Marijuana     Allergies   Benadryl [diphenhydramine], Adhesive [tape], Amoxicillin, Cyclobenzaprine, and Hydrocodone   Review of Systems Review of Systems  All other systems reviewed and are negative.    Physical  Exam Updated Vital Signs BP 138/60    Pulse 63    Temp 98.1 F (36.7 C) (Oral)    Resp 19    SpO2 92%   Physical Exam Vitals signs and  nursing note reviewed. Exam conducted with a chaperone present.  Constitutional:      Appearance: Normal appearance.  HENT:     Head: Normocephalic and atraumatic.  Eyes:     Comments: Left eye: Swollen shut.  Significant TTP and surrounding ecchymoses.  On passive opening, conjunctiva is injected.  Chemosis.  EOMs intact, but significant pain with looking up and moderate discomfort looking down. No obvious pupillary defect. Questionable exophthalmos. Blurred vision in affected eye.  Neck:     Musculoskeletal: Normal range of motion and neck supple. No neck rigidity or muscular tenderness.     Comments: No C-spine TTP. Cardiovascular:     Rate and Rhythm: Normal rate and regular rhythm.     Pulses: Normal pulses.     Heart sounds: Normal heart sounds.  Pulmonary:     Effort: Pulmonary effort is normal.  Abdominal:     General: Abdomen is flat. There is no distension.     Palpations: Abdomen is soft.     Tenderness: There is no abdominal tenderness. There is no guarding.  Skin:    General: Skin is dry.     Comments: 2 to 3 cm nonbleeding linear laceration on temporal aspect left eyebrow.  Appears relatively well approximated.  No surrounding erythema or swelling.  Neurological:     Mental Status: She is alert.     GCS: GCS eye subscore is 4. GCS verbal subscore is 5. GCS motor subscore is 6.     Comments: CN II through XII grossly intact.  Smiles symmetrically.  Extremity ROM and sensation intact.  Psychiatric:        Mood and Affect: Mood normal.        Behavior: Behavior normal.        Thought Content: Thought content normal.        ED Treatments / Results  Labs (all labs ordered are listed, but only abnormal results are displayed) Labs Reviewed  BASIC METABOLIC PANEL - Abnormal; Notable for the following components:      Result Value     Glucose, Bld 103 (*)    All other components within normal limits  CBC - Abnormal; Notable for the following components:   RBC 3.80 (*)    Hemoglobin 10.9 (*)    MCHC 29.9 (*)    All other components within normal limits  CBG MONITORING, ED - Abnormal; Notable for the following components:   Glucose-Capillary 106 (*)    All other components within normal limits  URINALYSIS, ROUTINE W REFLEX MICROSCOPIC    EKG None  Radiology Ct Head Wo Contrast  Result Date: 02/07/2019 CLINICAL DATA:  68 year old female with head trauma. EXAM: CT HEAD WITHOUT CONTRAST CT MAXILLOFACIAL WITHOUT CONTRAST TECHNIQUE: Multidetector CT imaging of the head and maxillofacial structures were performed using the standard protocol without intravenous contrast. Multiplanar CT image reconstructions of the maxillofacial structures were also generated. COMPARISON:  Brain MRI dated 08/16/2016. FINDINGS: CT HEAD FINDINGS Brain: The ventricles and sulci appropriate size for patient's age. The gray-white matter discrimination is preserved. Probable small focus of dilated prevascular space in the region of the left basal ganglia. There is no acute intracranial hemorrhage. No mass effect or midline shift. No extra-axial fluid collection. Vascular: No hyperdense vessel or unexpected calcification. Skull: Normal. Negative for fracture or focal lesion. Other: None CT MAXILLOFACIAL FINDINGS Osseous: There is a displaced and depressed fracture of the left orbital floor. There is a fracture of the medial wall of the left maxillary sinus.  No other acute fracture identified. No mandibular subluxation. Orbits: The globes appear intact. There is large amount of left orbital emphysema. There is abutment of the left inferior rectus muscle to the orbital floor fracture. Clinical correlation is recommended to exclude ocular entrapment. Sinuses: There is complete opacification of the left maxillary sinus consistent with hemosinus. There is  opacification of several left ethmoid air cells. Left mastoid effusions noted. Soft tissues: Left periorbital soft tissue swelling with left palpebral emphysema. IMPRESSION: 1. No acute intracranial pathology. 2. Displaced and depressed fracture of the left orbital floor with large amount of left orbital and palpebral emphysema resulting in mild left exophthalmos. Clinical correlation is recommended to exclude ocular entrapment. 3. Fracture of the medial wall of the left maxillary sinus. Electronically Signed   By: Anner Crete M.D.   On: 02/07/2019 15:31   Ct Maxillofacial Wo Contrast  Result Date: 02/07/2019 CLINICAL DATA:  68 year old female with head trauma. EXAM: CT HEAD WITHOUT CONTRAST CT MAXILLOFACIAL WITHOUT CONTRAST TECHNIQUE: Multidetector CT imaging of the head and maxillofacial structures were performed using the standard protocol without intravenous contrast. Multiplanar CT image reconstructions of the maxillofacial structures were also generated. COMPARISON:  Brain MRI dated 08/16/2016. FINDINGS: CT HEAD FINDINGS Brain: The ventricles and sulci appropriate size for patient's age. The gray-white matter discrimination is preserved. Probable small focus of dilated prevascular space in the region of the left basal ganglia. There is no acute intracranial hemorrhage. No mass effect or midline shift. No extra-axial fluid collection. Vascular: No hyperdense vessel or unexpected calcification. Skull: Normal. Negative for fracture or focal lesion. Other: None CT MAXILLOFACIAL FINDINGS Osseous: There is a displaced and depressed fracture of the left orbital floor. There is a fracture of the medial wall of the left maxillary sinus. No other acute fracture identified. No mandibular subluxation. Orbits: The globes appear intact. There is large amount of left orbital emphysema. There is abutment of the left inferior rectus muscle to the orbital floor fracture. Clinical correlation is recommended to exclude  ocular entrapment. Sinuses: There is complete opacification of the left maxillary sinus consistent with hemosinus. There is opacification of several left ethmoid air cells. Left mastoid effusions noted. Soft tissues: Left periorbital soft tissue swelling with left palpebral emphysema. IMPRESSION: 1. No acute intracranial pathology. 2. Displaced and depressed fracture of the left orbital floor with large amount of left orbital and palpebral emphysema resulting in mild left exophthalmos. Clinical correlation is recommended to exclude ocular entrapment. 3. Fracture of the medial wall of the left maxillary sinus. Electronically Signed   By: Anner Crete M.D.   On: 02/07/2019 15:31    Procedures Procedures (including critical care time)  Medications Ordered in ED Medications  sodium chloride flush (NS) 0.9 % injection 3 mL (3 mLs Intravenous Not Given 02/07/19 1441)  morphine 4 MG/ML injection 4 mg (4 mg Intravenous Given 02/07/19 1541)     Initial Impression / Assessment and Plan / ED Course  I have reviewed the triage vital signs and the nursing notes.  Pertinent labs & imaging results that were available during my care of the patient were reviewed by me and considered in my medical decision making (see chart for details).        CT maxillofacial without contrast demonstrates displaced and depressed fracture of left orbital floor with significant palpebral emphysema.  Imaging also demonstrates a fracture of the medial wall of left maxillary sinus.  CT head demonstrates no acute intracranial hemorrhage.  Consulted Dr. Iran Planas with ENT  Trauma. She states that if patient is not entrapped she can follow-up outpatient.  She also encourages patient to touch base with her regular ophthalmologist and optometrist in interim.  Patient was given 4 mg morphine IV for her pain.  CBC demonstrates a mild anemia, but congruent with her baseline.  No other medical abnormalities that would necessitate  further work-up or admission. Dr. Vanita Panda also personally evaluated patient and she is safe for discharge.  All of the evaluation and work-up results were discussed with the patient and any family at bedside. They were provided opportunity to ask any additional questions and have none at this time. They have expressed understanding of verbal discharge instructions as well as return precautions and are agreeable to the plan.   Final Clinical Impressions(s) / ED Diagnoses   Final diagnoses:  Closed fracture of left orbital floor, initial encounter [facility])    ED Discharge Orders         Ordered    oxyCODONE-acetaminophen (PERCOCET/ROXICET) 5-325 MG tablet  Every 4 hours PRN     02/07/19 1612           Reita Chard 02/07/19 1620    Carmin Muskrat, MD 02/08/19 [phone]

## 2019-02-07 NOTE — ED Notes (Signed)
Resident at bedside.  

## 2019-02-07 NOTE — Discharge Instructions (Signed)
Please take your medications, as prescribed.  Please call Dr. Iran Planas today or tomorrow to schedule an appointment for further evaluation and management of your orbital floor fracture. Please also follow-up with your regular ophthalmologist and optometrist in the interim.  You were given narcotic and or sedative medications while in the emergency department. Do not drive. Do not use machinery or power tools. Do not sign legal documents. Do not drink alcohol. Do not take sleeping pills. Do not supervise children by yourself. Do not participate in activities that require climbing or being in high places.

## 2019-02-13 DIAGNOSIS — S0232XA Fracture of orbital floor, left side, initial encounter for closed fracture: Secondary | ICD-10-CM | POA: Diagnosis not present

## 2019-02-15 ENCOUNTER — Other Ambulatory Visit: Payer: Self-pay | Admitting: Cardiovascular Disease

## 2019-02-15 MED ORDER — ISOSORBIDE MONONITRATE ER 60 MG PO TB24: 90.0000 mg | ORAL_TABLET | Freq: Every morning | ORAL | 0 refills | Status: DC

## 2019-02-15 NOTE — Telephone Encounter (Signed)
New message     *STAT* If patient is at the pharmacy, call can be transferred to refill team.   1. Which medications need to be refilled? (please list name of each medication and dose if known) isosorbide mononitrate (IMDUR) 60 MG 24 hr tablet  2. Which pharmacy/location (including street and city if local pharmacy) is medication to be sent to? [facility]  3. Do they need a 30 day or 90 day supply? [facility]

## 2019-02-25 DIAGNOSIS — E785 Hyperlipidemia, unspecified: Secondary | ICD-10-CM | POA: Diagnosis not present

## 2019-02-25 DIAGNOSIS — S0232XD Fracture of orbital floor, left side, subsequent encounter for fracture with routine healing: Secondary | ICD-10-CM | POA: Diagnosis not present

## 2019-02-25 DIAGNOSIS — I480 Paroxysmal atrial fibrillation: Secondary | ICD-10-CM | POA: Diagnosis not present

## 2019-02-25 DIAGNOSIS — J449 Chronic obstructive pulmonary disease, unspecified: Secondary | ICD-10-CM | POA: Diagnosis not present

## 2019-02-25 DIAGNOSIS — F322 Major depressive disorder, single episode, severe without psychotic features: Secondary | ICD-10-CM | POA: Diagnosis not present

## 2019-02-25 DIAGNOSIS — I10 Essential (primary) hypertension: Secondary | ICD-10-CM | POA: Diagnosis not present

## 2019-02-25 DIAGNOSIS — I208 Other forms of angina pectoris: Secondary | ICD-10-CM | POA: Diagnosis not present

## 2019-02-25 DIAGNOSIS — I25810 Atherosclerosis of coronary artery bypass graft(s) without angina pectoris: Secondary | ICD-10-CM | POA: Diagnosis not present

## 2019-03-13 DIAGNOSIS — I10 Essential (primary) hypertension: Secondary | ICD-10-CM | POA: Diagnosis not present

## 2019-03-13 DIAGNOSIS — M549 Dorsalgia, unspecified: Secondary | ICD-10-CM | POA: Diagnosis not present

## 2019-03-13 DIAGNOSIS — M79661 Pain in right lower leg: Secondary | ICD-10-CM | POA: Diagnosis not present

## 2019-03-13 DIAGNOSIS — F322 Major depressive disorder, single episode, severe without psychotic features: Secondary | ICD-10-CM | POA: Diagnosis not present

## 2019-03-13 DIAGNOSIS — I25810 Atherosclerosis of coronary artery bypass graft(s) without angina pectoris: Secondary | ICD-10-CM | POA: Diagnosis not present

## 2019-03-13 DIAGNOSIS — G2581 Restless legs syndrome: Secondary | ICD-10-CM | POA: Diagnosis not present

## 2019-03-28 DIAGNOSIS — M549 Dorsalgia, unspecified: Secondary | ICD-10-CM | POA: Diagnosis not present

## 2019-03-28 DIAGNOSIS — E785 Hyperlipidemia, unspecified: Secondary | ICD-10-CM | POA: Diagnosis not present

## 2019-03-28 DIAGNOSIS — I208 Other forms of angina pectoris: Secondary | ICD-10-CM | POA: Diagnosis not present

## 2019-03-28 DIAGNOSIS — I25810 Atherosclerosis of coronary artery bypass graft(s) without angina pectoris: Secondary | ICD-10-CM | POA: Diagnosis not present

## 2019-03-28 DIAGNOSIS — F411 Generalized anxiety disorder: Secondary | ICD-10-CM | POA: Diagnosis not present

## 2019-03-28 DIAGNOSIS — M79604 Pain in right leg: Secondary | ICD-10-CM | POA: Diagnosis not present

## 2019-03-28 DIAGNOSIS — I480 Paroxysmal atrial fibrillation: Secondary | ICD-10-CM | POA: Diagnosis not present

## 2019-03-28 DIAGNOSIS — I10 Essential (primary) hypertension: Secondary | ICD-10-CM | POA: Diagnosis not present

## 2019-03-28 DIAGNOSIS — M79671 Pain in right foot: Secondary | ICD-10-CM | POA: Diagnosis not present

## 2019-03-28 DIAGNOSIS — F322 Major depressive disorder, single episode, severe without psychotic features: Secondary | ICD-10-CM | POA: Diagnosis not present

## 2019-03-28 DIAGNOSIS — J449 Chronic obstructive pulmonary disease, unspecified: Secondary | ICD-10-CM | POA: Diagnosis not present

## 2019-04-03 DIAGNOSIS — M79604 Pain in right leg: Secondary | ICD-10-CM | POA: Diagnosis not present

## 2019-04-03 DIAGNOSIS — I743 Embolism and thrombosis of arteries of the lower extremities: Secondary | ICD-10-CM | POA: Diagnosis not present

## 2019-04-10 DIAGNOSIS — I10 Essential (primary) hypertension: Secondary | ICD-10-CM | POA: Diagnosis not present

## 2019-04-10 DIAGNOSIS — M549 Dorsalgia, unspecified: Secondary | ICD-10-CM | POA: Diagnosis not present

## 2019-04-10 DIAGNOSIS — M79671 Pain in right foot: Secondary | ICD-10-CM | POA: Diagnosis not present

## 2019-04-10 DIAGNOSIS — M79604 Pain in right leg: Secondary | ICD-10-CM | POA: Diagnosis not present

## 2019-04-19 ENCOUNTER — Other Ambulatory Visit: Payer: Self-pay

## 2019-04-19 ENCOUNTER — Encounter: Payer: Self-pay | Admitting: Vascular Surgery

## 2019-04-19 ENCOUNTER — Ambulatory Visit (INDEPENDENT_AMBULATORY_CARE_PROVIDER_SITE_OTHER): Payer: Medicare HMO | Admitting: Vascular Surgery

## 2019-04-19 VITALS — BP 91/47 | HR 53 | Temp 97.6°F | Resp 20 | Ht 61.0 in | Wt 153.0 lb

## 2019-04-19 DIAGNOSIS — I739 Peripheral vascular disease, unspecified: Secondary | ICD-10-CM | POA: Diagnosis not present

## 2019-04-19 NOTE — Progress Notes (Signed)
Patient ID: Wendy [name], female   DOB: 04-Jul-1950, 69 y.o.   MRN: [id number]  Reason for Consult: New Patient (Initial Visit)   Referred by Josetta Huddle, MD  Subjective:     HPI:  Wendy [name] is a 69 y.o. female presents for evaluation of right lower extremity rest pain.  She states is been going on for many months now.  Does not have tissue loss or ulceration.  States that the pain keeps her up at night.  She is limited in her walking severely.  Has history of left to right femorofemoral bypass she states was done at [facility] may be in 1999.  She also has previous history of coronary bypass.  She is a former smoker but has quit.  She does take Eliquis.  Past Medical History:  Diagnosis Date  . Anemia   . Anginal pain ([place])    jaw pain 07/2016, s/p 07/15/16 nuclear stress test  . Anxiety   . Arthritis    "lower back" (08/24/2017)  . CAD (coronary artery disease), native coronary artery    a. Hx CABG 1998, last PCI 2013 b.LHC 07/2013:  EF 55-60%, L-LAD prob atretic, no sig disease in LAD, S-OM1 ok with patent stent, S-dRCA ok => med Rx. c. LHC 01/2015 DES to SVG-RCA.  Marland Kitchen Chronic back pain   . Chronic leg pain   . Chronic lower back pain   . COPD (chronic obstructive pulmonary disease) ([place])   . Depression   . DVT (deep venous thrombosis) ([facility]) X 3   RLE  . Fall 08/2016  . GERD (gastroesophageal reflux disease)   . Glucose intolerance (impaired glucose tolerance)   . Headache    "a few/month" (08/24/2017)  . HLD (hyperlipidemia)   . Hypertension   . Myocardial infarction ([place]) 1999  . OSA on CPAP   . PAD (peripheral artery disease) ([place])   . PAF (paroxysmal atrial fibrillation) ([place])    a. 09/2013 post-op b. 01/2015  . Peripheral neuropathy   . Shortness of breath    when walking  . Wears glasses    Family History  Problem Relation Age of Onset  . Lung cancer Mother   . Clotting disorder Father   . Lung cancer Sister   . Heart disease Brother   .  Heart disease Brother   . Colon cancer Neg Hx   . Breast cancer Neg Hx    Past Surgical History:  Procedure Laterality Date  . ANTERIOR CERVICAL DECOMP/DISCECTOMY FUSION  1991  . BACK SURGERY    . CARDIAC CATHETERIZATION N/A 01/22/2015   Procedure: Left Heart Cath and Coronary Angiography;  Surgeon: Troy Sine, MD;  Location: [facility];  Service: Cardiovascular;  Laterality: N/A;  . CARDIAC CATHETERIZATION N/A 01/22/2015   Procedure: Coronary Stent Intervention;  Surgeon: Troy Sine, MD;  Location: [facility];  Service: Cardiovascular;  Laterality: N/A;  3.0x12 xience prox SVG to RCA  . CARDIAC CATHETERIZATION  03/09/2000   hx/notes 03/20/2000  . CHOLECYSTECTOMY N/A 09/16/2013   Procedure: LAPAROSCOPIC CHOLECYSTECTOMY CONVERTED TO OPEN CHOLECYSTECTOMY WITH CHOLANGIOGRAM;  Surgeon: [name], MD;  Location: [facility];  Service: General;  Laterality: N/A;  . CORONARY ANGIOPLASTY WITH STENT PLACEMENT  11/2011, 02/2012, 01/2015   DES to SVG-RCA both times, In [place], [place], Dr. Bruce Donath  . CORONARY ARTERY BYPASS GRAFT  1998   LIMA-LAD, SVG-OM, SVG-RCA  . ERCP N/A 09/19/2013   Procedure: ENDOSCOPIC RETROGRADE CHOLANGIOPANCREATOGRAPHY (ERCP);  Surgeon: [name], MD;  Location: [facility];  Service: Endoscopy;  Laterality: N/A;  . FINGER SURGERY Right    ring finger "fused"  . FRACTURE SURGERY    . IRRIGATION AND DEBRIDEMENT ABSCESS Right 10/17/2013   Procedure: INCISION AND DRAINAGE RIGHT SUBCOSTAL WOUND;  Surgeon: [name], MD;  Location: [facility];  Service: General;  Laterality: Right;  . JOINT REPLACEMENT    . KNEE ARTHROSCOPY Right 1998  . LEFT HEART CATH AND CORS/GRAFTS ANGIOGRAPHY N/A 07/25/2016   Procedure: Left Heart Cath and Cors/Grafts Angiography;  Surgeon: Jettie Booze, MD;  Location: [facility];  Service: Cardiovascular;  Laterality: N/A;  . LEFT HEART CATHETERIZATION WITH CORONARY/GRAFT ANGIOGRAM N/A 07/19/2013   Procedure:  LEFT HEART CATHETERIZATION WITH Beatrix Fetters;  Surgeon: Leonie Man, MD;  Location: [facility];  Service: Cardiovascular;  Laterality: N/A;  . MASS EXCISION Right 06/25/2014   Procedure: EXCISION BUTTOCK MASS ;  Surgeon: Coralie Keens, MD;  Location: [facility];  Service: General;  Laterality: Right;  . ORIF HUMERUS FRACTURE Left 08/27/2013   Procedure: OPEN REDUCTION INTERNAL FIXATION (ORIF) LEFT HUMERUS ;  Surgeon: Rozanna Box, MD;  Location: [facility];  Service: Orthopedics;  Laterality: Left;  . POSTERIOR FUSION CERVICAL SPINE  1992  . THROMBECTOMY / EMBOLECTOMY FEMORAL ARTERY Right    hx/notes 03/20/2000  . TOTAL HIP ARTHROPLASTY Right 07/19/2016   Procedure: TOTAL HIP ARTHROPLASTY ANTERIOR APPROACH;  Surgeon: [name], MD;  Location: [facility];  Service: Orthopedics;  Laterality: Right;  . TOTAL HIP REVISION Right 08/22/2017   Procedure: TOTAL HIP REVISION;  Surgeon: [name], MD;  Location: [facility];  Service: Orthopedics;  Laterality: Right;    Short Social History:  Social History   Tobacco Use  . Smoking status: Former Smoker    Packs/day: 0.50    Years: 34.00    Pack years: 17.00    Types: Cigarettes    Quit date: 07/20/1998    Years since quitting: 20.7  . Smokeless tobacco: Never Used  Substance Use Topics  . Alcohol use: Not Currently    Allergies  Allergen Reactions  . Benadryl [Diphenhydramine] Shortness Of Breath  . Adhesive [Tape] Other (See Comments)    Burning of skin  . Amoxicillin Diarrhea and Other (See Comments)    Has patient had a PCN reaction causing immediate rash, facial/tongue/throat swelling, SOB or lightheadedness with hypotension: No Has patient had a PCN reaction causing severe rash involving mucus membranes or skin necrosis: No Has patient had a PCN reaction that required hospitalization No Has patient had a PCN reaction occurring within the last 10 years: Yes -- noted rxn as diarrhea If all of the  above answers are "NO", then may proceed with Cephalosporin use.   . Cyclobenzaprine Other (See Comments)    Causes restless leg syndrome Restless syndrome  . Hydrocodone Itching    Current Outpatient Medications  Medication Sig Dispense Refill  . albuterol (PROVENTIL HFA;VENTOLIN HFA) 108 (90 Base) MCG/ACT inhaler Inhale 2 puffs into the lungs every 6 (six) hours as needed for wheezing or shortness of breath. 1 each 1  . ALPRAZolam (XANAX) 0.5 MG tablet Take 0.5 mg by mouth 3 (three) times daily as needed for anxiety.    Marland Kitchen apixaban (ELIQUIS) 5 MG TABS tablet Take 1 tablet (5 mg total) by mouth 2 (two) times daily. 180 tablet 1  . atorvastatin (LIPITOR) 80 MG tablet TAKE ONE TABLET BY MOUTH DAILY  AT 6PM 90 tablet 3  . buPROPion (WELLBUTRIN XL) 150 MG 24 hr tablet Take 150 mg by mouth daily.    . clopidogrel (PLAVIX) 75 MG tablet Take 75 mg by mouth daily.    . diclofenac sodium (VOLTAREN) 1 % GEL Apply 1 g topically daily as needed (pain).    Marland Kitchen ezetimibe (ZETIA) 10 MG tablet Take 10 mg by mouth daily.    . furosemide (LASIX) 20 MG tablet Take 1 tablet (20 mg total) by mouth daily as needed. 30 tablet 3  . isosorbide mononitrate (IMDUR) 60 MG 24 hr tablet Take 1.5 tablets (90 mg total) by mouth every morning. 135 tablet 0  . metoprolol tartrate (LOPRESSOR) 50 MG tablet Take 1 tablet (50 mg total) by mouth 2 (two) times daily. 180 tablet 3  . nitroGLYCERIN (NITROSTAT) 0.4 MG SL tablet Place 1 tablet (0.4 mg total) under the tongue every 5 (five) minutes as needed for chest pain. 25 tablet 5  . oxyCODONE (OXY IR/ROXICODONE) 5 MG immediate release tablet Take 1 tablet by mouth as needed.    . pantoprazole (PROTONIX) 40 MG tablet Take 1 tablet (40 mg total) by mouth daily. 90 tablet 3  . pramipexole (MIRAPEX) 0.25 MG tablet     . ranolazine (RANEXA) 1000 MG SR tablet Take 1,000 mg by mouth 2 (two) times daily.    . sertraline (ZOLOFT) 100 MG tablet Take 150 mg by mouth daily.     Marland Kitchen  triamcinolone cream (KENALOG) 0.1 % Apply 1 application topically as needed.    . valsartan-hydrochlorothiazide (DIOVAN-HCT) 160-12.5 MG tablet      Current Facility-Administered Medications  Medication Dose Route Frequency Provider Last Rate Last Admin  . ferrous sulfate tablet 325 mg  325 mg Oral BID WC Irene Shipper, MD        Review of Systems  Constitutional:  Constitutional negative. HENT: HENT negative.  Eyes: Eyes negative.  GI: Gastrointestinal negative.  Musculoskeletal: Positive for leg pain.  Skin: Negative for wound.  Neurological: Neurological negative. Psychiatric: Psychiatric negative.        Objective:  Objective   Vitals:   04/19/19 1320  BP: (!) 91/47  Pulse: (!) 53  Resp: 20  Temp: 97.6 F (36.4 C)  SpO2: 94%  Weight: 153 lb (69.4 kg)  Height: 5\' 1"  (1.549 m)   Body mass index is 28.91 kg/m.  Physical Exam HENT:     Head: Normocephalic.     Nose:     Comments: Mask in place Eyes:     Pupils: Pupils are equal, round, and reactive to light.  Cardiovascular:     Rate and Rhythm: Normal rate.     Pulses:          Radial pulses are 0 on the right side and 2+ on the left side.       Femoral pulses are 0 on the right side and 1+ on the left side.      Popliteal pulses are 0 on the right side and 1+ on the left side.       Dorsalis pedis pulses are 0 on the right side and 1+ on the left side.  Pulmonary:     Effort: Pulmonary effort is normal.  Abdominal:     General: Abdomen is flat.     Palpations: Abdomen is soft. There is no mass.  Musculoskeletal:        General: No swelling. Normal range of motion.  Skin:    General:  Skin is dry.  Neurological:     General: No focal deficit present.     Mental Status: She is alert.  Psychiatric:        Mood and Affect: Mood normal.        Behavior: Behavior normal.        Thought Content: Thought content normal.        Judgment: Judgment normal.     Data: Outside read of her arterial duplex  demonstrates an occluded femorofemoral bypass.  ABI on the right 0.52 on the left 0.82. (04/04/19)     Assessment/Plan:     69 year old female with history of femorofemoral bypass left to right.  This is occluded by duplex.  She has significant pain in the right lower extremity that appears consistent with rest pain.  We will begin with angiography from the left common femoral artery.  I reviewed his CT scan from 2016 which demonstrated very diminutive occluded common external iliac arteries.  Possibly she could have redo femorofemoral bypass versus need for  ax-fem versus aortobifem bypass.  Patient does have a cardiologist with history of previous bypass.  We will get her set up for angiography today with further surgical planning based on those results.     Waynetta Sandy MD [facility]

## 2019-04-26 ENCOUNTER — Other Ambulatory Visit (HOSPITAL_COMMUNITY)
Admission: RE | Admit: 2019-04-26 | Discharge: 2019-04-26 | Disposition: A | Discharge status: Home / Self Care | Payer: Medicare HMO | Source: Ambulatory Visit | Attending: Vascular Surgery | Admitting: Vascular Surgery

## 2019-04-26 DIAGNOSIS — Z01812 Encounter for preprocedural laboratory examination: Secondary | ICD-10-CM | POA: Diagnosis not present

## 2019-04-26 DIAGNOSIS — Z20822 Contact with and (suspected) exposure to covid-19: Secondary | ICD-10-CM | POA: Diagnosis not present

## 2019-04-26 LAB — SARS CORONAVIRUS 2 (TAT 6-24 HRS): SARS Coronavirus 2: NEGATIVE

## 2019-04-29 ENCOUNTER — Encounter (HOSPITAL_COMMUNITY)
Admission: RE | Disposition: A | Discharge status: Home / Self Care | Payer: Self-pay | Source: Home / Self Care | Attending: Vascular Surgery

## 2019-04-29 ENCOUNTER — Ambulatory Visit (HOSPITAL_COMMUNITY)
Admission: RE | Admit: 2019-04-29 | Discharge: 2019-04-29 | Disposition: A | Discharge status: Home / Self Care | Payer: Medicare HMO | Attending: Vascular Surgery | Admitting: Vascular Surgery

## 2019-04-29 ENCOUNTER — Other Ambulatory Visit: Payer: Self-pay

## 2019-04-29 DIAGNOSIS — I10 Essential (primary) hypertension: Secondary | ICD-10-CM | POA: Diagnosis not present

## 2019-04-29 DIAGNOSIS — I480 Paroxysmal atrial fibrillation: Secondary | ICD-10-CM | POA: Diagnosis not present

## 2019-04-29 DIAGNOSIS — I252 Old myocardial infarction: Secondary | ICD-10-CM | POA: Insufficient documentation

## 2019-04-29 DIAGNOSIS — K219 Gastro-esophageal reflux disease without esophagitis: Secondary | ICD-10-CM | POA: Diagnosis not present

## 2019-04-29 DIAGNOSIS — Z888 Allergy status to other drugs, medicaments and biological substances status: Secondary | ICD-10-CM | POA: Insufficient documentation

## 2019-04-29 DIAGNOSIS — Z79899 Other long term (current) drug therapy: Secondary | ICD-10-CM | POA: Insufficient documentation

## 2019-04-29 DIAGNOSIS — Z87891 Personal history of nicotine dependence: Secondary | ICD-10-CM | POA: Insufficient documentation

## 2019-04-29 DIAGNOSIS — I25119 Atherosclerotic heart disease of native coronary artery with unspecified angina pectoris: Secondary | ICD-10-CM | POA: Insufficient documentation

## 2019-04-29 DIAGNOSIS — Z881 Allergy status to other antibiotic agents status: Secondary | ICD-10-CM | POA: Insufficient documentation

## 2019-04-29 DIAGNOSIS — J449 Chronic obstructive pulmonary disease, unspecified: Secondary | ICD-10-CM | POA: Diagnosis not present

## 2019-04-29 DIAGNOSIS — Z7902 Long term (current) use of antithrombotics/antiplatelets: Secondary | ICD-10-CM | POA: Diagnosis not present

## 2019-04-29 DIAGNOSIS — F419 Anxiety disorder, unspecified: Secondary | ICD-10-CM | POA: Diagnosis not present

## 2019-04-29 DIAGNOSIS — G629 Polyneuropathy, unspecified: Secondary | ICD-10-CM | POA: Insufficient documentation

## 2019-04-29 DIAGNOSIS — G4733 Obstructive sleep apnea (adult) (pediatric): Secondary | ICD-10-CM | POA: Diagnosis not present

## 2019-04-29 DIAGNOSIS — Z86718 Personal history of other venous thrombosis and embolism: Secondary | ICD-10-CM | POA: Insufficient documentation

## 2019-04-29 DIAGNOSIS — Z886 Allergy status to analgesic agent status: Secondary | ICD-10-CM | POA: Diagnosis not present

## 2019-04-29 DIAGNOSIS — I70221 Atherosclerosis of native arteries of extremities with rest pain, right leg: Secondary | ICD-10-CM | POA: Diagnosis not present

## 2019-04-29 DIAGNOSIS — Z7901 Long term (current) use of anticoagulants: Secondary | ICD-10-CM | POA: Insufficient documentation

## 2019-04-29 DIAGNOSIS — E785 Hyperlipidemia, unspecified: Secondary | ICD-10-CM | POA: Diagnosis not present

## 2019-04-29 DIAGNOSIS — Z951 Presence of aortocoronary bypass graft: Secondary | ICD-10-CM | POA: Diagnosis not present

## 2019-04-29 DIAGNOSIS — F329 Major depressive disorder, single episode, unspecified: Secondary | ICD-10-CM | POA: Insufficient documentation

## 2019-04-29 HISTORY — PX: ABDOMINAL AORTOGRAM W/LOWER EXTREMITY: CATH118223

## 2019-04-29 LAB — POCT I-STAT, CHEM 8
BUN: 17 mg/dL (ref 8–23)
Calcium, Ion: 1.04 mmol/L — ABNORMAL LOW (ref 1.15–1.40)
Chloride: 101 mmol/L (ref 98–111)
Creatinine, Ser: 0.6 mg/dL (ref 0.44–1.00)
Glucose, Bld: 115 mg/dL — ABNORMAL HIGH (ref 70–99)
HCT: 33 % — ABNORMAL LOW (ref 36.0–46.0)
Hemoglobin: 11.2 g/dL — ABNORMAL LOW (ref 12.0–15.0)
Potassium: 3.4 mmol/L — ABNORMAL LOW (ref 3.5–5.1)
Sodium: 144 mmol/L (ref 135–145)
TCO2: 33 mmol/L — ABNORMAL HIGH (ref 22–32)

## 2019-04-29 SURGERY — ABDOMINAL AORTOGRAM W/LOWER EXTREMITY
Anesthesia: LOCAL | Laterality: Bilateral

## 2019-04-29 MED ORDER — MIDAZOLAM HCL 2 MG/2ML IJ SOLN
INTRAMUSCULAR | Status: AC
  Filled 2019-04-29: qty 2

## 2019-04-29 MED ORDER — OXYCODONE HCL 5 MG PO TABS
5.0000 mg | ORAL_TABLET | ORAL | Status: DC | PRN
  Administered 2019-04-29: 13:00:00 10 mg via ORAL
  Filled 2019-04-29: qty 2

## 2019-04-29 MED ORDER — FENTANYL CITRATE (PF) 100 MCG/2ML IJ SOLN
INTRAMUSCULAR | Status: DC | PRN
  Administered 2019-04-29: 25 ug via INTRAVENOUS

## 2019-04-29 MED ORDER — LIDOCAINE HCL (PF) 1 % IJ SOLN
INTRAMUSCULAR | Status: DC | PRN
  Administered 2019-04-29: 15 mL via INTRADERMAL

## 2019-04-29 MED ORDER — HYDRALAZINE HCL 20 MG/ML IJ SOLN: 5.0000 mg | INTRAMUSCULAR | Status: DC | PRN

## 2019-04-29 MED ORDER — SODIUM CHLORIDE 0.9% FLUSH: 3.0000 mL | Freq: Two times a day (BID) | INTRAVENOUS | Status: DC

## 2019-04-29 MED ORDER — SODIUM CHLORIDE 0.9 % IV SOLN: 250.0000 mL | INTRAVENOUS | Status: DC | PRN

## 2019-04-29 MED ORDER — ONDANSETRON HCL 4 MG/2ML IJ SOLN: 4.0000 mg | Freq: Four times a day (QID) | INTRAMUSCULAR | Status: DC | PRN

## 2019-04-29 MED ORDER — ACETAMINOPHEN 325 MG PO TABS: 650.0000 mg | ORAL_TABLET | ORAL | Status: DC | PRN

## 2019-04-29 MED ORDER — SODIUM CHLORIDE 0.9 % IV SOLN: INTRAVENOUS | Status: DC

## 2019-04-29 MED ORDER — SODIUM CHLORIDE 0.9% FLUSH: 3.0000 mL | INTRAVENOUS | Status: DC | PRN

## 2019-04-29 MED ORDER — IODIXANOL 320 MG/ML IV SOLN
INTRAVENOUS | Status: DC | PRN
  Administered 2019-04-29: 115 mL via INTRA_ARTERIAL

## 2019-04-29 MED ORDER — LABETALOL HCL 5 MG/ML IV SOLN: 10.0000 mg | INTRAVENOUS | Status: DC | PRN

## 2019-04-29 MED ORDER — MIDAZOLAM HCL 2 MG/2ML IJ SOLN
INTRAMUSCULAR | Status: DC | PRN
  Administered 2019-04-29 (×2): 1 mg via INTRAVENOUS

## 2019-04-29 MED ORDER — HEPARIN (PORCINE) IN NACL 1000-0.9 UT/500ML-% IV SOLN
INTRAVENOUS | Status: DC | PRN
  Administered 2019-04-29 (×2): 500 mL

## 2019-04-29 MED ORDER — MORPHINE SULFATE (PF) 2 MG/ML IV SOLN: 2.0000 mg | INTRAVENOUS | Status: DC | PRN

## 2019-04-29 MED ORDER — LIDOCAINE HCL (PF) 1 % IJ SOLN
INTRAMUSCULAR | Status: AC
  Filled 2019-04-29: qty 30

## 2019-04-29 MED ORDER — FENTANYL CITRATE (PF) 100 MCG/2ML IJ SOLN
INTRAMUSCULAR | Status: AC
  Filled 2019-04-29: qty 2

## 2019-04-29 MED ORDER — SODIUM CHLORIDE 0.9 % WEIGHT BASED INFUSION: 1.0000 mL/kg/h | INTRAVENOUS | Status: DC

## 2019-04-29 MED ORDER — HEPARIN (PORCINE) IN NACL 1000-0.9 UT/500ML-% IV SOLN
INTRAVENOUS | Status: AC
  Filled 2019-04-29: qty 1000

## 2019-04-29 SURGICAL SUPPLY — 9 items
CATH OMNI FLUSH 5F 65CM (CATHETERS) ×1 IMPLANT
KIT MICROPUNCTURE NIT STIFF (SHEATH) ×1 IMPLANT
KIT PV (KITS) ×2 IMPLANT
SHEATH PINNACLE 5F 10CM (SHEATH) ×1 IMPLANT
SHEATH PROBE COVER 6X72 (BAG) ×1 IMPLANT
SYR MEDRAD MARK V 150ML (SYRINGE) ×1 IMPLANT
TRANSDUCER W/STOPCOCK (MISCELLANEOUS) ×2 IMPLANT
TRAY PV CATH (CUSTOM PROCEDURE TRAY) ×2 IMPLANT
WIRE BENTSON .035X145CM (WIRE) ×1 IMPLANT

## 2019-04-29 NOTE — Progress Notes (Signed)
Up and walked and tolerated well; left groin stable, no bleeding or hematoma 

## 2019-04-29 NOTE — Discharge Instructions (Signed)
Femoral Site Care This sheet gives you information about how to care for yourself after your procedure. Your health care provider may also give you more specific instructions. If you have problems or questions, contact your health care provider. What can I expect after the procedure? After the procedure, it is common to have:  Bruising that usually fades within 1-2 weeks.  Tenderness at the site. Follow these instructions at home: Wound care  Follow instructions from your health care provider about how to take care of your insertion site. Make sure you: ? Wash your hands with soap and water before you change your bandage (dressing). If soap and water are not available, use hand sanitizer. ? Change your dressing as told by your health care provider. ? Leave stitches (sutures), skin glue, or adhesive strips in place. These skin closures may need to stay in place for 2 weeks or longer. If adhesive strip edges start to loosen and curl up, you may trim the loose edges. Do not remove adhesive strips completely unless your health care provider tells you to do that.  Do not take baths, swim, or use a hot tub until your health care provider approves.  You may shower 24-48 hours after the procedure or as told by your health care provider. ? Gently wash the site with plain soap and water. ? Pat the area dry with a clean towel. ? Do not rub the site. This may cause bleeding.  Do not apply powder or lotion to the site. Keep the site clean and dry.  Check your femoral site every day for signs of infection. Check for: ? Redness, swelling, or pain. ? Fluid or blood. ? Warmth. ? Pus or a bad smell. Activity  For the first 2-3 days after your procedure, or as long as directed: ? Avoid climbing stairs as much as possible. ? Do not squat.  Do not lift anything that is heavier than 10 lb (4.5 kg), or the limit that you are told, until your health care provider says that it is safe.  Rest as  directed. ? Avoid sitting for a long time without moving. Get up to take short walks every 1-2 hours.  Do not drive for 24 hours if you were given a medicine to help you relax (sedative). General instructions  Take over-the-counter and prescription medicines only as told by your health care provider.  Keep all follow-up visits as told by your health care provider. This is important. Contact a health care provider if you have:  A fever or chills.  You have redness, swelling, or pain around your insertion site. Get help right away if:  The catheter insertion area swells very fast.  You pass out.  You suddenly start to sweat or your skin gets clammy.  The catheter insertion area is bleeding, and the bleeding does not stop when you hold steady pressure on the area.  The area near or just beyond the catheter insertion site becomes pale, cool, tingly, or numb. These symptoms may represent a serious problem that is an emergency. Do not wait to see if the symptoms will go away. Get medical help right away. Call your local emergency services (911 in the [place] Do not drive yourself to the hospital. Summary  After the procedure, it is common to have bruising that usually fades within 1-2 weeks.  Check your femoral site every day for signs of infection.  Do not lift anything that is heavier than 10 lb (4.5 kg), or the   limit that you are told, until your health care provider says that it is safe. This information is not intended to replace advice given to you by your health care provider. Make sure you discuss any questions you have with your health care provider. Document Revised: 03/06/2017 Document Reviewed: 03/06/2017 Elsevier Patient Education  2020 Elsevier Inc.  

## 2019-04-29 NOTE — Op Note (Signed)
    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 Donzetta Matters, MD Procedure Performed: 1.  Ultrasound-guided cannulation left common femoral artery 2.  Catheter placement in aorta with aortogram bilateral lower extremity runoff 3.  Moderate sedation with fentanyl and Versed for 9minutes  Indications: 69 year old female with a history of a left to right femorofemoral bypass.  The previous indication for this was unknown.  Patient now has occluded bypass also has rest pain in the right lower extremity.  She is indicated for angiography possible intervention.  Findings: Aorta is patent although distally it appears diminutive.  Left common iliac and external leg arteries are patent.  Left-sided common femoral artery appears to have disease stenosis likely greater than 50%.  She has inline flow via the SFA popliteal and appears to be anterior tibial dominant on the left.  On the right side common femoral artery is occluded.  She reconstitutes a profunda on the right.  She then appears to reconstitute above-the-knee popliteal and has runoff via the anterior tibial artery on the right.  On delayed imaging it appears that the SFA comes back to near the common femoral artery.  Patient will be best served with aortobifemoral bypass with possible right femoral to popliteal artery bypass unless we can get good backbleeding from the SFA and profunda.  If it is thought that her heart did not tolerate this we could also consider redo femorofemoral bypass with femoropopliteal bypass.   Procedure:  The patient was identified in the holding area and taken to room 8.  The patient was then placed supine on the table and prepped and draped in the usual sterile fashion.  A time out was called.  Ultrasound was used to evaluate the left common femoral artery.   There was significant disease there.  There is anesthetized 1% lidocaine cannulated micropuncture needle followed by wire and sheath.  And images saved the permanent record.  B[place]on wire was placed followed by [place] sheath.  Omni catheter was placed to the level of L1 aortogram was performed followed by bilateral extremity runoff.  We then performed spot pictures of the right common femoral artery which is noted to be occluded although profunda is patent after a few centimeters in the SFA appears to retract all the way back up after reconstituting popliteal.  We will be considering her for the above procedure.  Catheter was removed over wire.  Sheath will be pulled in postoperative holding.  She tolerated procedure without any complication.   Contrast: 115cc  Atlee Kluth C. Donzetta Matters, MD [facility] Office: [phone] Pager: [phone]

## 2019-04-29 NOTE — H&P (Signed)
   History and Physical Update  The patient was interviewed and re-examined.  The patient's previous History and Physical has been reviewed and is unchanged from recent office visit.  Plan will be for angiography from the left common femoral artery today.  Unlikely we can fully treat her disease although possibly can stent her left common iliac artery.  Options will be redo femorofemoral versus axillary bifemoral versus aortobifem bypass.  Ab Leaming C. Donzetta Matters, MD [facility] [place] Office: [phone] Pager: [phone]   04/29/2019, 8:53 AM

## 2019-04-30 ENCOUNTER — Encounter: Payer: Self-pay | Admitting: *Deleted

## 2019-05-02 DIAGNOSIS — I480 Paroxysmal atrial fibrillation: Secondary | ICD-10-CM | POA: Diagnosis not present

## 2019-05-02 DIAGNOSIS — I208 Other forms of angina pectoris: Secondary | ICD-10-CM | POA: Diagnosis not present

## 2019-05-02 DIAGNOSIS — I10 Essential (primary) hypertension: Secondary | ICD-10-CM | POA: Diagnosis not present

## 2019-05-02 DIAGNOSIS — F322 Major depressive disorder, single episode, severe without psychotic features: Secondary | ICD-10-CM | POA: Diagnosis not present

## 2019-05-02 DIAGNOSIS — I25810 Atherosclerosis of coronary artery bypass graft(s) without angina pectoris: Secondary | ICD-10-CM | POA: Diagnosis not present

## 2019-05-02 DIAGNOSIS — E785 Hyperlipidemia, unspecified: Secondary | ICD-10-CM | POA: Diagnosis not present

## 2019-05-02 DIAGNOSIS — J449 Chronic obstructive pulmonary disease, unspecified: Secondary | ICD-10-CM | POA: Diagnosis not present

## 2019-05-03 DIAGNOSIS — I10 Essential (primary) hypertension: Secondary | ICD-10-CM | POA: Diagnosis not present

## 2019-05-05 ENCOUNTER — Other Ambulatory Visit: Payer: Self-pay

## 2019-05-05 ENCOUNTER — Ambulatory Visit: Payer: Medicare HMO | Attending: Internal Medicine

## 2019-05-05 DIAGNOSIS — Z23 Encounter for immunization: Secondary | ICD-10-CM

## 2019-05-05 NOTE — Progress Notes (Signed)
   Covid-19 Vaccination Clinic  Name:  Wendy [name]    MRN: [id number] DOB: 1950-06-05  05/05/2019  Wendy [name] was observed post Covid-19 immunization for 30 minutes based on pre-vaccination screening without incidence. She was provided with Vaccine Information Sheet and instruction to access the V-Safe system.   Wendy [name] was instructed to call 911 with any severe reactions post vaccine: Marland Kitchen Difficulty breathing  . Swelling of your face and throat  . A fast heartbeat  . A bad rash all over your body  . Dizziness and weakness    Immunizations Administered    Name Date Dose VIS Date Route   Pfizer COVID-19 Vaccine 05/05/2019 11:35 AM 0.3 mL 02/15/2019 Intramuscular   Manufacturer: [place]   Lot: [id number]   [place]: [id number]

## 2019-05-13 DIAGNOSIS — M549 Dorsalgia, unspecified: Secondary | ICD-10-CM | POA: Diagnosis not present

## 2019-05-13 DIAGNOSIS — E785 Hyperlipidemia, unspecified: Secondary | ICD-10-CM | POA: Diagnosis not present

## 2019-05-16 ENCOUNTER — Encounter: Payer: Self-pay | Admitting: *Deleted

## 2019-05-16 ENCOUNTER — Ambulatory Visit (INDEPENDENT_AMBULATORY_CARE_PROVIDER_SITE_OTHER): Payer: Medicare HMO | Admitting: Physician Assistant

## 2019-05-16 ENCOUNTER — Other Ambulatory Visit: Payer: Self-pay

## 2019-05-16 ENCOUNTER — Encounter: Payer: Self-pay | Admitting: Physician Assistant

## 2019-05-16 VITALS — BP 130/62 | HR 61 | Ht 61.0 in | Wt 144.0 lb

## 2019-05-16 DIAGNOSIS — I5042 Chronic combined systolic (congestive) and diastolic (congestive) heart failure: Secondary | ICD-10-CM

## 2019-05-16 DIAGNOSIS — I10 Essential (primary) hypertension: Secondary | ICD-10-CM | POA: Diagnosis not present

## 2019-05-16 DIAGNOSIS — I25700 Atherosclerosis of coronary artery bypass graft(s), unspecified, with unstable angina pectoris: Secondary | ICD-10-CM

## 2019-05-16 DIAGNOSIS — Z01810 Encounter for preprocedural cardiovascular examination: Secondary | ICD-10-CM

## 2019-05-16 DIAGNOSIS — R079 Chest pain, unspecified: Secondary | ICD-10-CM | POA: Diagnosis not present

## 2019-05-16 DIAGNOSIS — I25708 Atherosclerosis of coronary artery bypass graft(s), unspecified, with other forms of angina pectoris: Secondary | ICD-10-CM | POA: Diagnosis not present

## 2019-05-16 DIAGNOSIS — I480 Paroxysmal atrial fibrillation: Secondary | ICD-10-CM

## 2019-05-16 DIAGNOSIS — I208 Other forms of angina pectoris: Secondary | ICD-10-CM | POA: Diagnosis not present

## 2019-05-16 NOTE — Patient Instructions (Addendum)
Medication Instructions:  Your physician recommends that you continue on your current medications as directed. Please refer to the Current Medication list given to you today.   *If you need a refill on your cardiac medications before your next appointment, please call your pharmacy*   Lab Work: None ordered  If you have labs (blood work) drawn today and your tests are completely normal, you will receive your results only by: Marland Kitchen MyChart Message (if you have MyChart) OR . A paper copy in the mail If you have any lab test that is abnormal or we need to change your treatment, we will call you to review the results.   Testing/Procedures: Your physician has requested that you have a lexiscan myoview. For further information please visit [email or web address]. Please follow instruction sheet, as given.    Follow-Up: At [facility], you and your health needs are our priority.  As part of our continuing mission to provide you with exceptional heart care, we have created designated Provider Care Teams.  These Care Teams include your primary Cardiologist (physician) and Advanced Practice Providers (APPs -  Physician Assistants and Nurse Practitioners) who all work together to provide you with the care you need, when you need it.  We recommend signing up for the patient portal called "MyChart".  Sign up information is provided on this After Visit Summary.  MyChart is used to connect with patients for Virtual Visits (Telemedicine).  Patients are able to view lab/test results, encounter notes, upcoming appointments, etc.  Non-urgent messages can be sent to your provider as well.   To learn more about what you can do with MyChart, go to [email or web address].    Your next appointment:   3-4 month(s)  The format for your next appointment:   In Person  Provider:   You may see [name], MD or one of the following Advanced Practice Providers on your designated Care Team:    Richardson Dopp,  PA-C  [place], [place]  Daune Perch, NP    Other Instructions   Cardiac Nuclear Scan A cardiac nuclear scan is a test that is done to check the flow of blood to your heart. It is done when you are resting and when you are exercising. The test looks for problems such as:  Not enough blood reaching a portion of the heart.  The heart muscle not working as it should. You may need this test if:  You have heart disease.  You have had lab results that are not normal.  You have had heart surgery or a balloon procedure to open up blocked arteries (angioplasty).  You have chest pain.  You have shortness of breath. In this test, a special dye (tracer) is put into your bloodstream. The tracer will travel to your heart. A camera will then take pictures of your heart to see how the tracer moves through your heart. This test is usually done at a hospital and takes 2-4 hours. Tell a doctor about:  Any allergies you have.  All medicines you are taking, including vitamins, herbs, eye drops, creams, and over-the-counter medicines.  Any problems you or family members have had with anesthetic medicines.  Any blood disorders you have.  Any surgeries you have had.  Any medical conditions you have.  Whether you are pregnant or may be pregnant. What are the risks? Generally, this is a safe test. However, problems may occur, such as:  Serious chest pain and heart attack. This is only a risk if the stress  portion of the test is done.  Rapid heartbeat.  A feeling of warmth in your chest. This feeling usually does not last long.  Allergic reaction to the tracer. What happens before the test?  Ask your doctor about changing or stopping your normal medicines. This is important.  Follow instructions from your doctor about what you cannot eat or drink.  Remove your jewelry on the day of the test. What happens during the test?  An IV tube will be inserted into one of your  veins.  Your doctor will give you a small amount of tracer through the IV tube.  You will wait for 20-40 minutes while the tracer moves through your bloodstream.  Your heart will be monitored with an electrocardiogram (ECG).  You will lie down on an exam table.  Pictures of your heart will be taken for about 15-20 minutes.  You may also have a stress test. For this test, one of these things may be done: ? You will be asked to exercise on a treadmill or a stationary bike. ? You will be given medicines that will make your heart work harder. This is done if you are unable to exercise.  When blood flow to your heart has peaked, a tracer will again be given through the IV tube.  After 20-40 minutes, you will get back on the exam table. More pictures will be taken of your heart.  Depending on the tracer that is used, more pictures may need to be taken 3-4 hours later.  Your IV tube will be removed when the test is over. The test may vary among doctors and hospitals. What happens after the test?  Ask your doctor: ? Whether you can return to your normal schedule, including diet, activities, and medicines. ? Whether you should drink more fluids. This will help to remove the tracer from your body. Drink enough fluid to keep your pee (urine) pale yellow.  Ask your doctor, or the department that is doing the test: ? When will my results be ready? ? How will I get my results? Summary  A cardiac nuclear scan is a test that is done to check the flow of blood to your heart.  Tell your doctor whether you are pregnant or may be pregnant.  Before the test, ask your doctor about changing or stopping your normal medicines. This is important.  Ask your doctor whether you can return to your normal activities. You may be asked to drink more fluids. This information is not intended to replace advice given to you by your health care provider. Make sure you discuss any questions you have with your  health care provider. Document Revised: 06/13/2018 Document Reviewed: 08/07/2017 Elsevier Patient Education  [place].

## 2019-05-16 NOTE — H&P (View-Only) (Signed)
Cardiology Office Note    Date:  05/16/2019   ID:  Wendy [name], DOB Oct 26, 1950, MRN [id number]  PCP:  Josetta Huddle, MD  Cardiologist:  Dr. [name]  Chief Complaint: surgical clearance for aortobifemoral bypass with possible right femoral to popliteal artery bypass   History of Present Illness:   Wendy [name] is a 69 y.o. female with a hx of PAD, CAD status post NSTEMI s/p CABG x3 (SVG-OM, LIMA-LAD, SVG-RCA in 1998) with PCI on 2013 and 2015, PAF,  hypertension, chronic combined systolic and diastolic heart failure, COPD, GERD, peripheral neuropathy, and polysubstance abuse seen for surgical clearance.   Hx of near syncope after taking SL nitro. Noted hypotensive in ER. No work up.   Last [facility] 07/2016- Occluded SVG to marginal, known from prior. LIMA to LAD known to be atretic from prior cath. SVG to PDA occluded. This is a new finding. Mid LAD lesion, 50% stenosed. This is an ulcerated, irregular lesion. There are left to right collaterals. EF 25-35% by visual estimate.  Hx of significant PAD s/p  femorofemoral bypass left to right. Most recent findings suggested occluded bypass also has rest pain in the right lower extremity. She underwent abdominal aortogram w/ lower extremity on 04/29/19 by Dr. Donzetta Matters. Recommended " Patient will be best served with aortobifemoral bypass with possible right femoral to popliteal artery bypass unless we can get good backbleeding from the SFA and profunda.  If it is thought that her heart did not tolerate this we could also consider redo femorofemoral bypass with femoropopliteal bypass".   Here today for surgical clearance.  Patient has right thigh pain.  Sedentary lifestyle.    Patient reports having substernal chest tightness radiating to her jaw. This resolved with sublingual nitroglycerin each time (sometimes with 1 and other times multiple) she is on maximum therapy with Imdur, beta-blocker and Ranexa.  Denies palpitation, shortness of breath,  dizziness, orthopnea, PND, syncope or lower extremity edema.  She feels numbness and tingling on her feet.  Patient denies tobacco smoking or illicit drug use.  No alcohol abuse.  Past Medical History:  Diagnosis Date  . Anemia   . Anginal pain ([place])    jaw pain 07/2016, s/p 07/15/16 nuclear stress test  . Anxiety   . Arthritis    "lower back" (08/24/2017)  . CAD (coronary artery disease), native coronary artery    a. Hx CABG 1998, last PCI 2013 b.LHC 07/2013:  EF 55-60%, L-LAD prob atretic, no sig disease in LAD, S-OM1 ok with patent stent, S-dRCA ok => med Rx. c. LHC 01/2015 DES to SVG-RCA.  Marland Kitchen Chronic back pain   . Chronic leg pain   . Chronic lower back pain   . COPD (chronic obstructive pulmonary disease) ([place])   . Depression   . DVT (deep venous thrombosis) ([facility]) X 3   RLE  . Fall 08/2016  . GERD (gastroesophageal reflux disease)   . Glucose intolerance (impaired glucose tolerance)   . Headache    "a few/month" (08/24/2017)  . HLD (hyperlipidemia)   . Hypertension   . Myocardial infarction ([place]) 1999  . OSA on CPAP   . PAD (peripheral artery disease) ([place])   . PAF (paroxysmal atrial fibrillation) ([place])    a. 09/2013 post-op b. 01/2015  . Peripheral neuropathy   . Shortness of breath    when walking  . Wears glasses     Past Surgical History:  Procedure Laterality Date  . ABDOMINAL AORTOGRAM W/LOWER EXTREMITY Bilateral 04/29/2019  Procedure: ABDOMINAL AORTOGRAM W/LOWER EXTREMITY;  Surgeon: Waynetta Sandy, MD;  Location: [place] [facility];  Service: Cardiovascular;  Laterality: Bilateral;  . ANTERIOR CERVICAL DECOMP/DISCECTOMY FUSION  1991  . BACK SURGERY    . CARDIAC CATHETERIZATION N/A 01/22/2015   Procedure: Left Heart Cath and Coronary Angiography;  Surgeon: Troy Sine, MD;  Location: [place] [facility];  Service: Cardiovascular;  Laterality: N/A;  . CARDIAC CATHETERIZATION N/A 01/22/2015   Procedure: Coronary Stent Intervention;  Surgeon: Troy Sine, MD;  Location: [place] [facility];  Service: Cardiovascular;  Laterality: N/A;  3.0x12 xience prox SVG to RCA  . CARDIAC CATHETERIZATION  03/09/2000   hx/notes 03/20/2000  . CHOLECYSTECTOMY N/A 09/16/2013   Procedure: LAPAROSCOPIC CHOLECYSTECTOMY CONVERTED TO OPEN CHOLECYSTECTOMY WITH CHOLANGIOGRAM;  Surgeon: [name], MD;  Location: [place];  Service: General;  Laterality: N/A;  . CORONARY ANGIOPLASTY WITH STENT PLACEMENT  11/2011, 02/2012, 01/2015   DES to SVG-RCA both times, In [place], [place], Dr. Bruce Donath  . CORONARY ARTERY BYPASS GRAFT  1998   LIMA-LAD, SVG-OM, SVG-RCA  . ERCP N/A 09/19/2013   Procedure: ENDOSCOPIC RETROGRADE CHOLANGIOPANCREATOGRAPHY (ERCP);  Surgeon: [name], MD;  Location: [place];  Service: Endoscopy;  Laterality: N/A;  . FINGER SURGERY Right    ring finger "fused"  . FRACTURE SURGERY    . IRRIGATION AND DEBRIDEMENT ABSCESS Right 10/17/2013   Procedure: INCISION AND DRAINAGE RIGHT SUBCOSTAL WOUND;  Surgeon: [name], MD;  Location: [facility];  Service: General;  Laterality: Right;  . JOINT REPLACEMENT    . KNEE ARTHROSCOPY Right 1998  . LEFT HEART CATH AND CORS/GRAFTS ANGIOGRAPHY N/A 07/25/2016   Procedure: Left Heart Cath and Cors/Grafts Angiography;  Surgeon: Jettie Booze, MD;  Location: [place] [facility];  Service: Cardiovascular;  Laterality: N/A;  . LEFT HEART CATHETERIZATION WITH CORONARY/GRAFT ANGIOGRAM N/A 07/19/2013   Procedure: LEFT HEART CATHETERIZATION WITH Beatrix Fetters;  Surgeon: Leonie Man, MD;  Location: [facility];  Service: Cardiovascular;  Laterality: N/A;  . MASS EXCISION Right 06/25/2014   Procedure: EXCISION BUTTOCK MASS ;  Surgeon: Coralie Keens, MD;  Location: [place];  Service: General;  Laterality: Right;  . ORIF HUMERUS FRACTURE Left 08/27/2013   Procedure: OPEN REDUCTION INTERNAL FIXATION (ORIF) LEFT HUMERUS ;  Surgeon: Rozanna Box, MD;  Location: [place];  Service:  Orthopedics;  Laterality: Left;  . POSTERIOR FUSION CERVICAL SPINE  1992  . THROMBECTOMY / EMBOLECTOMY FEMORAL ARTERY Right    hx/notes 03/20/2000  . TOTAL HIP ARTHROPLASTY Right 07/19/2016   Procedure: TOTAL HIP ARTHROPLASTY ANTERIOR APPROACH;  Surgeon: [name], MD;  Location: [place];  Service: Orthopedics;  Laterality: Right;  . TOTAL HIP REVISION Right 08/22/2017   Procedure: TOTAL HIP REVISION;  Surgeon: [name], MD;  Location: [place];  Service: Orthopedics;  Laterality: Right;    Current Medications: Prior to Admission medications   Medication Sig Start Date End Date Taking? Authorizing Provider  albuterol (PROVENTIL HFA;VENTOLIN HFA) 108 (90 Base) MCG/ACT inhaler Inhale 2 puffs into the lungs every 6 (six) hours as needed for wheezing or shortness of breath. 01/01/18   Dezerae Freiberger, Crista Luria, PA  ALPRAZolam (XANAX) 0.5 MG tablet Take 0.5 mg by mouth 3 (three) times daily as needed for anxiety.    [provider]  apixaban (ELIQUIS) 5 MG TABS tablet Take 1 tablet (5 mg total) by mouth 2 (two) times daily. 11/20/18   Nahser, Wonda Cheng, MD  atorvastatin (LIPITOR) 80 MG tablet TAKE ONE TABLET BY MOUTH DAILY AT 6PM Patient taking differently: Take 80 mg by mouth daily at 6 PM.  11/20/18   Nahser, Wonda Cheng, MD  buPROPion (WELLBUTRIN XL) 150 MG 24 hr tablet Take 150 mg by mouth daily.    [provider]  clopidogrel (PLAVIX) 75 MG tablet Take 75 mg by mouth daily.    [provider]  diclofenac sodium (VOLTAREN) 1 % GEL Apply 1 g topically daily as needed (pain).    [provider]  ezetimibe (ZETIA) 10 MG tablet Take 10 mg by mouth daily.    [provider]  furosemide (LASIX) 20 MG tablet Take 1 tablet (20 mg total) by mouth daily as needed. Patient taking differently: Take 20 mg by mouth daily as needed for fluid.  11/20/18 11/20/19  Nahser, Wonda Cheng, MD  isosorbide mononitrate (IMDUR) 60 MG 24 hr tablet Take 1.5 tablets (90 mg total) by  mouth every morning. Patient taking differently: Take 60 mg by mouth daily.  02/15/19   Nahser, Wonda Cheng, MD  metoprolol tartrate (LOPRESSOR) 50 MG tablet Take 1 tablet (50 mg total) by mouth 2 (two) times daily. 11/20/18   Nahser, Wonda Cheng, MD  nitroGLYCERIN (NITROSTAT) 0.4 MG SL tablet Place 1 tablet (0.4 mg total) under the tongue every 5 (five) minutes as needed for chest pain. 11/20/18   Nahser, Wonda Cheng, MD  Oxycodone HCl 10 MG TABS Take 10 mg by mouth 3 (three) times daily. 04/11/19   [provider]  pantoprazole (PROTONIX) 40 MG tablet Take 1 tablet (40 mg total) by mouth daily. 11/20/18   Nahser, Wonda Cheng, MD  pramipexole (MIRAPEX) 0.25 MG tablet Take 0.25 mg by mouth at bedtime.  02/18/19   [provider]  ranolazine (RANEXA) 1000 MG SR tablet Take 1,000 mg by mouth 2 (two) times daily.    [provider]  sertraline (ZOLOFT) 100 MG tablet Take 150 mg by mouth at bedtime.     [provider]  valsartan-hydrochlorothiazide (DIOVAN-HCT) 160-12.5 MG tablet Take 1 tablet by mouth daily.  03/14/19   [provider]    Allergies:   Benadryl [diphenhydramine], Penicillins, Adhesive [tape], Amoxicillin, Cyclobenzaprine, and Hydrocodone   Social History   Socioeconomic History  . Marital status: Divorced    Spouse name: Not on file  . Number of children: 5  . Years of education: Not on file  . Highest education level: Not on file  Occupational History  . Occupation: Disabled  Tobacco Use  . Smoking status: Former Smoker    Packs/day: 0.50    Years: 34.00    Pack years: 17.00    Types: Cigarettes    Quit date: 07/20/1998    Years since quitting: 20.8  . Smokeless tobacco: Never Used  Substance and Sexual Activity  . Alcohol use: Not Currently  . Drug use: Not Currently    Types: Marijuana  . Sexual activity: Not Currently  Other Topics Concern  . Not on file  Social History Narrative   Lives with best friend.    Social Determinants of  Health   Financial Resource Strain:   . Difficulty of Paying Living Expenses:   Food Insecurity:   . Worried About Charity fundraiser in the Last Year:   . Arboriculturist in the Last Year:   Transportation Needs:   . Film/video editor (Medical):   Marland Kitchen Lack of Transportation (Non-Medical):   Physical Activity:   .  Days of Exercise per Week:   . Minutes of Exercise per Session:   Stress:   . Feeling of Stress :   Social Connections:   . Frequency of Communication with Friends and Family:   . Frequency of Social Gatherings with Friends and Family:   . Attends Religious Services:   . Active Member of Clubs or Organizations:   . Attends Archivist Meetings:   Marland Kitchen Marital Status:      Family History:  The patient's family history includes Clotting disorder in her father; Heart disease in her brother and brother; Lung cancer in her mother and sister.   ROS:   Please see the history of present illness.    ROS All other systems reviewed and are negative.   PHYSICAL EXAM:   VS:  BP 130/62   Pulse 61   Ht 5\' 1"  (1.549 m)   Wt 144 lb (65.3 kg)   SpO2 95%   BMI 27.21 kg/m    GEN: Well nourished, well developed, in no acute distress  HEENT: normal  Neck: no JVD, carotid bruits, or masses Cardiac: RRR; no murmurs, rubs, or gallops,no edema  Respiratory:  clear to auscultation bilaterally, normal work of breathing GI: soft, nontender, nondistended, + BS MS: no deformity or atrophy  Skin: warm and dry, no rash Neuro:  Alert and Oriented x 3, Strength and sensation are intact Psych: euthymic mood, full affect  Wt Readings from Last 3 Encounters:  05/16/19 144 lb (65.3 kg)  04/19/19 153 lb (69.4 kg)  11/28/18 165 lb (74.8 kg)      Studies/Labs Reviewed:   EKG:  EKG is ordered today.  The ekg ordered today demonstrates sinus rhythm at rate of 61 bpm  Recent Labs: 02/07/2019: Platelets 208 04/29/2019: BUN 17; Creatinine, Ser 0.60; Hemoglobin 11.2; Potassium 3.4;  Sodium 144   Lipid Panel    Component Value Date/Time   CHOL 98 (L) 12/05/2016 1139   TRIG 73 12/05/2016 1139   HDL 43 12/05/2016 1139   CHOLHDL 2.3 12/05/2016 1139   CHOLHDL 4.1 08/13/2015 0939   VLDL 48 (H) 08/13/2015 0939   LDLCALC 40 12/05/2016 1139    Additional studies/ records that were reviewed today include:   Echocardiogram: 07/2016 Study Conclusions   - Left ventricle: The cavity size was normal. Wall thickness was  normal. Systolic function was normal. The estimated ejection  fraction was in the range of 60% to 65%. Wall motion was normal;  there were no regional wall motion abnormalities. The study is  not technically sufficient to allow evaluation of LV diastolic  function.  - Aortic valve: Mildly calcified annulus. Trileaflet.  - Mitral valve: Calcified annulus. There was trivial regurgitation.  - Right atrium: Central venous pressure (est): 3 mm Hg.  - Tricuspid valve: There was trivial regurgitation.  - Pulmonary arteries: PA peak pressure: 24 mm Hg (S).  - Pericardium, extracardiac: There was no pericardial effusion.   Impressions:   - Normal LV wall thickness with LVEF 60-65%. Indeterminate  diastolic function. Mitral annular calcification with trivial  mitral regurgitation. Trivial tricuspid regurgitation with PASP  24 mmHg.   Left Heart Cath and Cors/Grafts Angiography 07/2016  Conclusion    Severe three-vessel coronary artery disease.  Unable to gain access from the left common femoral. It appears there is a left to right fem-fem graft based on the wire course after needle access to the vessel.  Occluded SVG to marginal, known from prior. LIMA to LAD known to  be atretic from prior cath.  SVG to PDA occluded. This is a new finding.  Mid LAD lesion, 50 %stenosed. This is an ulcerated, irregular lesion. There are left to right collaterals.  The left ventricular ejection fraction is 25-35% by visual estimate.  There is moderate  to severe left ventricular systolic dysfunction.  LV end diastolic pressure is moderately elevated.  There is no aortic valve stenosis.  Of note, poor radial pulses bilaterally. Right brachial artery was accessed using ultrasound guidance.   I suspect her troponin elevation is from heart failure. It does not have a typical ACS pattern. I don't think her SVG to RCA occlusion is acute.  Continue medical therapy for heart failure.  Consider viability study. If inferior wall is viable, could consider cardiac surgery consultation for repeat CABG.   Diagnostic Dominance: Right        ASSESSMENT & PLAN:    1. Unstable angina with CAD Patient reports having anginal symptoms similar to prior CABG and PCI.  This resolved with sublingual nitroglycerin.  She is on Imdur 90 mg daily, Ranexa 1000 mg twice daily and Lopressor 50 mg twice daily. Unable to increase beta-blocker secondary to slow baseline heart rate.  Continue Plavix, Lipitor and Zetia.  I have personally discussed with Dr. [name] today.  She is not getting at least 4 METS of activity.  Will get stress test prior to surgical clearance.  Cath only if high area of ischemia.  Dr. [name] will review.  Another option of amlodipine for antianginal if required.  No change in medication made today.  2. PAF -Maintaining sinus rhythm.  On Eliquis for anticoagulation.  No bleeding issue.  3. HTN -Blood pressure stable on current medication  4. HLD - No results found for requested labs within last 8760 hours.  -Continue Lipitor and Zetia  5. Surgical clearance  -As summarized above   Medication Adjustments/Labs and Tests Ordered: Current medicines are reviewed at length with the patient today.  Concerns regarding medicines are outlined above.  Medication changes, Labs and Tests ordered today are listed in the Patient Instructions below. Patient Instructions  Medication Instructions:  Your physician recommends that you continue on  your current medications as directed. Please refer to the Current Medication list given to you today.   *If you need a refill on your cardiac medications before your next appointment, please call your pharmacy*   Lab Work: None ordered  If you have labs (blood work) drawn today and your tests are completely normal, you will receive your results only by: Marland Kitchen MyChart Message (if you have MyChart) OR . A paper copy in the mail If you have any lab test that is abnormal or we need to change your treatment, we will call you to review the results.   Testing/Procedures: Your physician has requested that you have a lexiscan myoview. For further information please visit [email or web address]. Please follow instruction sheet, as given.    Follow-Up: At [facility], you and your health needs are our priority.  As part of our continuing mission to provide you with exceptional heart care, we have created designated Provider Care Teams.  These Care Teams include your primary Cardiologist (physician) and Advanced Practice Providers (APPs -  Physician Assistants and Nurse Practitioners) who all work together to provide you with the care you need, when you need it.  We recommend signing up for the patient portal called "MyChart".  Sign up information is provided on this After Visit Summary.  MyChart is  used to connect with patients for Virtual Visits (Telemedicine).  Patients are able to view lab/test results, encounter notes, upcoming appointments, etc.  Non-urgent messages can be sent to your provider as well.   To learn more about what you can do with MyChart, go to [email or web address].    Your next appointment:   3-4 month(s)  The format for your next appointment:   In Person  Provider:   You may see [name], MD or one of the following Advanced Practice Providers on your designated Care Team:    Richardson Dopp, PA-C  [place], [place]  Daune Perch, NP    Other Instructions    Cardiac Nuclear Scan A cardiac nuclear scan is a test that is done to check the flow of blood to your heart. It is done when you are resting and when you are exercising. The test looks for problems such as:  Not enough blood reaching a portion of the heart.  The heart muscle not working as it should. You may need this test if:  You have heart disease.  You have had lab results that are not normal.  You have had heart surgery or a balloon procedure to open up blocked arteries (angioplasty).  You have chest pain.  You have shortness of breath. In this test, a special dye (tracer) is put into your bloodstream. The tracer will travel to your heart. A camera will then take pictures of your heart to see how the tracer moves through your heart. This test is usually done at a hospital and takes 2-4 hours. Tell a doctor about:  Any allergies you have.  All medicines you are taking, including vitamins, herbs, eye drops, creams, and over-the-counter medicines.  Any problems you or family members have had with anesthetic medicines.  Any blood disorders you have.  Any surgeries you have had.  Any medical conditions you have.  Whether you are pregnant or may be pregnant. What are the risks? Generally, this is a safe test. However, problems may occur, such as:  Serious chest pain and heart attack. This is only a risk if the stress portion of the test is done.  Rapid heartbeat.  A feeling of warmth in your chest. This feeling usually does not last long.  Allergic reaction to the tracer. What happens before the test?  Ask your doctor about changing or stopping your normal medicines. This is important.  Follow instructions from your doctor about what you cannot eat or drink.  Remove your jewelry on the day of the test. What happens during the test?  An IV tube will be inserted into one of your veins.  Your doctor will give you a small amount of tracer through the IV tube.  You  will wait for 20-40 minutes while the tracer moves through your bloodstream.  Your heart will be monitored with an electrocardiogram (ECG).  You will lie down on an exam table.  Pictures of your heart will be taken for about 15-20 minutes.  You may also have a stress test. For this test, one of these things may be done: ? You will be asked to exercise on a treadmill or a stationary bike. ? You will be given medicines that will make your heart work harder. This is done if you are unable to exercise.  When blood flow to your heart has peaked, a tracer will again be given through the IV tube.  After 20-40 minutes, you will get back on the exam table.  More pictures will be taken of your heart.  Depending on the tracer that is used, more pictures may need to be taken 3-4 hours later.  Your IV tube will be removed when the test is over. The test may vary among doctors and hospitals. What happens after the test?  Ask your doctor: ? Whether you can return to your normal schedule, including diet, activities, and medicines. ? Whether you should drink more fluids. This will help to remove the tracer from your body. Drink enough fluid to keep your pee (urine) pale yellow.  Ask your doctor, or the department that is doing the test: ? When will my results be ready? ? How will I get my results? Summary  A cardiac nuclear scan is a test that is done to check the flow of blood to your heart.  Tell your doctor whether you are pregnant or may be pregnant.  Before the test, ask your doctor about changing or stopping your normal medicines. This is important.  Ask your doctor whether you can return to your normal activities. You may be asked to drink more fluids. This information is not intended to replace advice given to you by your health care provider. Make sure you discuss any questions you have with your health care provider. Document Revised: 06/13/2018 Document Reviewed: 08/07/2017 Elsevier  Patient Education  [place], [place], [place]  05/16/2019 12:21 PM    [facility] [place], [place], [place]  [address] Phone: [phone]; Fax: [phone]

## 2019-05-16 NOTE — Progress Notes (Signed)
Cardiology Office Note    Date:  05/16/2019   ID:  Wendy [name], Wendy [name], MRN [id number]  PCP:  Josetta Huddle, MD  Cardiologist:  Dr. [name]  Chief Complaint: surgical clearance for aortobifemoral bypass with possible right femoral to popliteal artery bypass   History of Present Illness:   Wendy [name] is a 69 y.o. female with a hx of PAD, CAD status post NSTEMI s/p CABG x3 (SVG-OM, LIMA-LAD, SVG-RCA in 1998) with PCI on 2013 and 2015, PAF,  hypertension, chronic combined systolic and diastolic heart failure, COPD, GERD, peripheral neuropathy, and polysubstance abuse seen for surgical clearance.   Hx of near syncope after taking SL nitro. Noted hypotensive in ER. No work up.   Last [facility] 07/2016- Occluded SVG to marginal, known from prior. LIMA to LAD known to be atretic from prior cath. SVG to PDA occluded. This is a new finding. Mid LAD lesion, 50% stenosed. This is an ulcerated, irregular lesion. There are left to right collaterals. EF 25-35% by visual estimate.  Hx of significant PAD s/p  femorofemoral bypass left to right. Most recent findings suggested occluded bypass also has rest pain in the right lower extremity. She underwent abdominal aortogram w/ lower extremity on 04/29/19 by Dr. Donzetta Matters. Recommended " Patient will be best served with aortobifemoral bypass with possible right femoral to popliteal artery bypass unless we can get good backbleeding from the SFA and profunda.  If it is thought that her heart did not tolerate this we could also consider redo femorofemoral bypass with femoropopliteal bypass".   Here today for surgical clearance.  Patient has right thigh pain.  Sedentary lifestyle.    Patient reports having substernal chest tightness radiating to her jaw. This resolved with sublingual nitroglycerin each time (sometimes with 1 and other times multiple) she is on maximum therapy with Imdur, beta-blocker and Ranexa.  Denies palpitation, shortness of breath,  dizziness, orthopnea, PND, syncope or lower extremity edema.  She feels numbness and tingling on her feet.  Patient denies tobacco smoking or illicit drug use.  No alcohol abuse.  Past Medical History:  Diagnosis Date  . Anemia   . Anginal pain ([place])    jaw pain 07/2016, s/p 07/15/16 nuclear stress test  . Anxiety   . Arthritis    "lower back" (08/24/2017)  . CAD (coronary artery disease), native coronary artery    a. Hx CABG 1998, last PCI 2013 b.LHC 07/2013:  EF 55-60%, L-LAD prob atretic, no sig disease in LAD, S-OM1 ok with patent stent, S-dRCA ok => med Rx. c. LHC 01/2015 DES to SVG-RCA.  Marland Kitchen Chronic back pain   . Chronic leg pain   . Chronic lower back pain   . COPD (chronic obstructive pulmonary disease) ([place])   . Depression   . DVT (deep venous thrombosis) ([facility]) X 3   RLE  . Fall 08/2016  . GERD (gastroesophageal reflux disease)   . Glucose intolerance (impaired glucose tolerance)   . Headache    "a few/month" (08/24/2017)  . HLD (hyperlipidemia)   . Hypertension   . Myocardial infarction ([place]) 1999  . OSA on CPAP   . PAD (peripheral artery disease) ([place])   . PAF (paroxysmal atrial fibrillation) ([place])    a. 09/2013 post-op b. 01/2015  . Peripheral neuropathy   . Shortness of breath    when walking  . Wears glasses     Past Surgical History:  Procedure Laterality Date  . ABDOMINAL AORTOGRAM W/LOWER EXTREMITY Bilateral 04/29/2019  Procedure: ABDOMINAL AORTOGRAM W/LOWER EXTREMITY;  Surgeon: Waynetta Sandy, MD;  Location: [place] [facility];  Service: Cardiovascular;  Laterality: Bilateral;  . ANTERIOR CERVICAL DECOMP/DISCECTOMY FUSION  1991  . BACK SURGERY    . CARDIAC CATHETERIZATION N/A 01/22/2015   Procedure: Left Heart Cath and Coronary Angiography;  Surgeon: Troy Sine, MD;  Location: [place] [facility];  Service: Cardiovascular;  Laterality: N/A;  . CARDIAC CATHETERIZATION N/A 01/22/2015   Procedure: Coronary Stent Intervention;  Surgeon: Troy Sine, MD;  Location: [place] [facility];  Service: Cardiovascular;  Laterality: N/A;  3.0x12 xience prox SVG to RCA  . CARDIAC CATHETERIZATION  03/09/2000   hx/notes 03/20/2000  . CHOLECYSTECTOMY N/A 09/16/2013   Procedure: LAPAROSCOPIC CHOLECYSTECTOMY CONVERTED TO OPEN CHOLECYSTECTOMY WITH CHOLANGIOGRAM;  Surgeon: [name], MD;  Location: [place];  Service: General;  Laterality: N/A;  . CORONARY ANGIOPLASTY WITH STENT PLACEMENT  11/2011, 02/2012, 01/2015   DES to SVG-RCA both times, In [place], [place], Dr. Bruce Donath  . CORONARY ARTERY BYPASS GRAFT  1998   LIMA-LAD, SVG-OM, SVG-RCA  . ERCP N/A 09/19/2013   Procedure: ENDOSCOPIC RETROGRADE CHOLANGIOPANCREATOGRAPHY (ERCP);  Surgeon: [name], MD;  Location: [place];  Service: Endoscopy;  Laterality: N/A;  . FINGER SURGERY Right    ring finger "fused"  . FRACTURE SURGERY    . IRRIGATION AND DEBRIDEMENT ABSCESS Right 10/17/2013   Procedure: INCISION AND DRAINAGE RIGHT SUBCOSTAL WOUND;  Surgeon: [name], MD;  Location: [facility];  Service: General;  Laterality: Right;  . JOINT REPLACEMENT    . KNEE ARTHROSCOPY Right 1998  . LEFT HEART CATH AND CORS/GRAFTS ANGIOGRAPHY N/A 07/25/2016   Procedure: Left Heart Cath and Cors/Grafts Angiography;  Surgeon: Jettie Booze, MD;  Location: [place] [facility];  Service: Cardiovascular;  Laterality: N/A;  . LEFT HEART CATHETERIZATION WITH CORONARY/GRAFT ANGIOGRAM N/A 07/19/2013   Procedure: LEFT HEART CATHETERIZATION WITH Beatrix Fetters;  Surgeon: Leonie Man, MD;  Location: [facility];  Service: Cardiovascular;  Laterality: N/A;  . MASS EXCISION Right 06/25/2014   Procedure: EXCISION BUTTOCK MASS ;  Surgeon: Coralie Keens, MD;  Location: [place];  Service: General;  Laterality: Right;  . ORIF HUMERUS FRACTURE Left 08/27/2013   Procedure: OPEN REDUCTION INTERNAL FIXATION (ORIF) LEFT HUMERUS ;  Surgeon: Rozanna Box, MD;  Location: [place];  Service:  Orthopedics;  Laterality: Left;  . POSTERIOR FUSION CERVICAL SPINE  1992  . THROMBECTOMY / EMBOLECTOMY FEMORAL ARTERY Right    hx/notes 03/20/2000  . TOTAL HIP ARTHROPLASTY Right 07/19/2016   Procedure: TOTAL HIP ARTHROPLASTY ANTERIOR APPROACH;  Surgeon: [name], MD;  Location: [place];  Service: Orthopedics;  Laterality: Right;  . TOTAL HIP REVISION Right 08/22/2017   Procedure: TOTAL HIP REVISION;  Surgeon: [name], MD;  Location: [place];  Service: Orthopedics;  Laterality: Right;    Current Medications: Prior to Admission medications   Medication Sig Start Date End Date Taking? Authorizing Provider  albuterol (PROVENTIL HFA;VENTOLIN HFA) 108 (90 Base) MCG/ACT inhaler Inhale 2 puffs into the lungs every 6 (six) hours as needed for wheezing or shortness of breath. 01/01/18   Lawsyn Heiler, Crista Luria, PA  ALPRAZolam (XANAX) 0.5 MG tablet Take 0.5 mg by mouth 3 (three) times daily as needed for anxiety.    [provider]  apixaban (ELIQUIS) 5 MG TABS tablet Take 1 tablet (5 mg total) by mouth 2 (two) times daily. 11/20/18   Nahser, Wonda Cheng, MD  atorvastatin (LIPITOR) 80 MG tablet TAKE ONE TABLET BY MOUTH DAILY AT 6PM Patient taking differently: Take 80 mg by mouth daily at 6 PM.  11/20/18   Nahser, Wonda Cheng, MD  buPROPion (WELLBUTRIN XL) 150 MG 24 hr tablet Take 150 mg by mouth daily.    [provider]  clopidogrel (PLAVIX) 75 MG tablet Take 75 mg by mouth daily.    [provider]  diclofenac sodium (VOLTAREN) 1 % GEL Apply 1 g topically daily as needed (pain).    [provider]  ezetimibe (ZETIA) 10 MG tablet Take 10 mg by mouth daily.    [provider]  furosemide (LASIX) 20 MG tablet Take 1 tablet (20 mg total) by mouth daily as needed. Patient taking differently: Take 20 mg by mouth daily as needed for fluid.  11/20/18 11/20/19  Nahser, Wonda Cheng, MD  isosorbide mononitrate (IMDUR) 60 MG 24 hr tablet Take 1.5 tablets (90 mg total) by  mouth every morning. Patient taking differently: Take 60 mg by mouth daily.  02/15/19   Nahser, Wonda Cheng, MD  metoprolol tartrate (LOPRESSOR) 50 MG tablet Take 1 tablet (50 mg total) by mouth 2 (two) times daily. 11/20/18   Nahser, Wonda Cheng, MD  nitroGLYCERIN (NITROSTAT) 0.4 MG SL tablet Place 1 tablet (0.4 mg total) under the tongue every 5 (five) minutes as needed for chest pain. 11/20/18   Nahser, Wonda Cheng, MD  Oxycodone HCl 10 MG TABS Take 10 mg by mouth 3 (three) times daily. 04/11/19   [provider]  pantoprazole (PROTONIX) 40 MG tablet Take 1 tablet (40 mg total) by mouth daily. 11/20/18   Nahser, Wonda Cheng, MD  pramipexole (MIRAPEX) 0.25 MG tablet Take 0.25 mg by mouth at bedtime.  02/18/19   [provider]  ranolazine (RANEXA) 1000 MG SR tablet Take 1,000 mg by mouth 2 (two) times daily.    [provider]  sertraline (ZOLOFT) 100 MG tablet Take 150 mg by mouth at bedtime.     [provider]  valsartan-hydrochlorothiazide (DIOVAN-HCT) 160-12.5 MG tablet Take 1 tablet by mouth daily.  03/14/19   [provider]    Allergies:   Benadryl [diphenhydramine], Penicillins, Adhesive [tape], Amoxicillin, Cyclobenzaprine, and Hydrocodone   Social History   Socioeconomic History  . Marital status: Divorced    Spouse name: Not on file  . Number of children: 5  . Years of education: Not on file  . Highest education level: Not on file  Occupational History  . Occupation: Disabled  Tobacco Use  . Smoking status: Former Smoker    Packs/day: 0.50    Years: 34.00    Pack years: 17.00    Types: Cigarettes    Quit date: 07/20/1998    Years since quitting: 20.8  . Smokeless tobacco: Never Used  Substance and Sexual Activity  . Alcohol use: Not Currently  . Drug use: Not Currently    Types: Marijuana  . Sexual activity: Not Currently  Other Topics Concern  . Not on file  Social History Narrative   Lives with best friend.    Social Determinants of  Health   Financial Resource Strain:   . Difficulty of Paying Living Expenses:   Food Insecurity:   . Worried About Charity fundraiser in the Last Year:   . Arboriculturist in the Last Year:   Transportation Needs:   . Film/video editor (Medical):   Marland Kitchen Lack of Transportation (Non-Medical):   Physical Activity:   .  Days of Exercise per Week:   . Minutes of Exercise per Session:   Stress:   . Feeling of Stress :   Social Connections:   . Frequency of Communication with Friends and Family:   . Frequency of Social Gatherings with Friends and Family:   . Attends Religious Services:   . Active Member of Clubs or Organizations:   . Attends Archivist Meetings:   Marland Kitchen Marital Status:      Family History:  The patient's family history includes Clotting disorder in her father; Heart disease in her brother and brother; Lung cancer in her mother and sister.   ROS:   Please see the history of present illness.    ROS All other systems reviewed and are negative.   PHYSICAL EXAM:   VS:  BP 130/62   Pulse 61   Ht 5\' 1"  (1.549 m)   Wt 144 lb (65.3 kg)   SpO2 95%   BMI 27.21 kg/m    GEN: Well nourished, well developed, in no acute distress  HEENT: normal  Neck: no JVD, carotid bruits, or masses Cardiac: RRR; no murmurs, rubs, or gallops,no edema  Respiratory:  clear to auscultation bilaterally, normal work of breathing GI: soft, nontender, nondistended, + BS MS: no deformity or atrophy  Skin: warm and dry, no rash Neuro:  Alert and Oriented x 3, Strength and sensation are intact Psych: euthymic mood, full affect  Wt Readings from Last 3 Encounters:  05/16/19 144 lb (65.3 kg)  04/19/19 153 lb (69.4 kg)  11/28/18 165 lb (74.8 kg)      Studies/Labs Reviewed:   EKG:  EKG is ordered today.  The ekg ordered today demonstrates sinus rhythm at rate of 61 bpm  Recent Labs: 02/07/2019: Platelets 208 04/29/2019: BUN 17; Creatinine, Ser 0.60; Hemoglobin 11.2; Potassium 3.4;  Sodium 144   Lipid Panel    Component Value Date/Time   CHOL 98 (L) 12/05/2016 1139   TRIG 73 12/05/2016 1139   HDL 43 12/05/2016 1139   CHOLHDL 2.3 12/05/2016 1139   CHOLHDL 4.1 08/13/2015 0939   VLDL 48 (H) 08/13/2015 0939   LDLCALC 40 12/05/2016 1139    Additional studies/ records that were reviewed today include:   Echocardiogram: 07/2016 Study Conclusions   - Left ventricle: The cavity size was normal. Wall thickness was  normal. Systolic function was normal. The estimated ejection  fraction was in the range of 60% to 65%. Wall motion was normal;  there were no regional wall motion abnormalities. The study is  not technically sufficient to allow evaluation of LV diastolic  function.  - Aortic valve: Mildly calcified annulus. Trileaflet.  - Mitral valve: Calcified annulus. There was trivial regurgitation.  - Right atrium: Central venous pressure (est): 3 mm Hg.  - Tricuspid valve: There was trivial regurgitation.  - Pulmonary arteries: PA peak pressure: 24 mm Hg (S).  - Pericardium, extracardiac: There was no pericardial effusion.   Impressions:   - Normal LV wall thickness with LVEF 60-65%. Indeterminate  diastolic function. Mitral annular calcification with trivial  mitral regurgitation. Trivial tricuspid regurgitation with PASP  24 mmHg.   Left Heart Cath and Cors/Grafts Angiography 07/2016  Conclusion    Severe three-vessel coronary artery disease.  Unable to gain access from the left common femoral. It appears there is a left to right fem-fem graft based on the wire course after needle access to the vessel.  Occluded SVG to marginal, known from prior. LIMA to LAD known to  be atretic from prior cath.  SVG to PDA occluded. This is a new finding.  Mid LAD lesion, 50 %stenosed. This is an ulcerated, irregular lesion. There are left to right collaterals.  The left ventricular ejection fraction is 25-35% by visual estimate.  There is moderate  to severe left ventricular systolic dysfunction.  LV end diastolic pressure is moderately elevated.  There is no aortic valve stenosis.  Of note, poor radial pulses bilaterally. Right brachial artery was accessed using ultrasound guidance.   I suspect her troponin elevation is from heart failure. It does not have a typical ACS pattern. I don't think her SVG to RCA occlusion is acute.  Continue medical therapy for heart failure.  Consider viability study. If inferior wall is viable, could consider cardiac surgery consultation for repeat CABG.   Diagnostic Dominance: Right        ASSESSMENT & PLAN:    1. Unstable angina with CAD Patient reports having anginal symptoms similar to prior CABG and PCI.  This resolved with sublingual nitroglycerin.  She is on Imdur 90 mg daily, Ranexa 1000 mg twice daily and Lopressor 50 mg twice daily. Unable to increase beta-blocker secondary to slow baseline heart rate.  Continue Plavix, Lipitor and Zetia.  I have personally discussed with Dr. [name] today.  She is not getting at least 4 METS of activity.  Will get stress test prior to surgical clearance.  Cath only if high area of ischemia.  Dr. [name] will review.  Another option of amlodipine for antianginal if required.  No change in medication made today.  2. PAF -Maintaining sinus rhythm.  On Eliquis for anticoagulation.  No bleeding issue.  3. HTN -Blood pressure stable on current medication  4. HLD - No results found for requested labs within last 8760 hours.  -Continue Lipitor and Zetia  5. Surgical clearance  -As summarized above   Medication Adjustments/Labs and Tests Ordered: Current medicines are reviewed at length with the patient today.  Concerns regarding medicines are outlined above.  Medication changes, Labs and Tests ordered today are listed in the Patient Instructions below. Patient Instructions  Medication Instructions:  Your physician recommends that you continue on  your current medications as directed. Please refer to the Current Medication list given to you today.   *If you need a refill on your cardiac medications before your next appointment, please call your pharmacy*   Lab Work: None ordered  If you have labs (blood work) drawn today and your tests are completely normal, you will receive your results only by: Marland Kitchen MyChart Message (if you have MyChart) OR . A paper copy in the mail If you have any lab test that is abnormal or we need to change your treatment, we will call you to review the results.   Testing/Procedures: Your physician has requested that you have a lexiscan myoview. For further information please visit [email or web address]. Please follow instruction sheet, as given.    Follow-Up: At [facility], you and your health needs are our priority.  As part of our continuing mission to provide you with exceptional heart care, we have created designated Provider Care Teams.  These Care Teams include your primary Cardiologist (physician) and Advanced Practice Providers (APPs -  Physician Assistants and Nurse Practitioners) who all work together to provide you with the care you need, when you need it.  We recommend signing up for the patient portal called "MyChart".  Sign up information is provided on this After Visit Summary.  MyChart is  used to connect with patients for Virtual Visits (Telemedicine).  Patients are able to view lab/test results, encounter notes, upcoming appointments, etc.  Non-urgent messages can be sent to your provider as well.   To learn more about what you can do with MyChart, go to [email or web address].    Your next appointment:   3-4 month(s)  The format for your next appointment:   In Person  Provider:   You may see [name], MD or one of the following Advanced Practice Providers on your designated Care Team:    Richardson Dopp, PA-C  [place], [place]  Daune Perch, NP    Other  Instructions   Cardiac Nuclear Scan A cardiac nuclear scan is a test that is done to check the flow of blood to your heart. It is done when you are resting and when you are exercising. The test looks for problems such as:  Not enough blood reaching a portion of the heart.  The heart muscle not working as it should. You may need this test if:  You have heart disease.  You have had lab results that are not normal.  You have had heart surgery or a balloon procedure to open up blocked arteries (angioplasty).  You have chest pain.  You have shortness of breath. In this test, a special dye (tracer) is put into your bloodstream. The tracer will travel to your heart. A camera will then take pictures of your heart to see how the tracer moves through your heart. This test is usually done at a hospital and takes 2-4 hours. Tell a doctor about:  Any allergies you have.  All medicines you are taking, including vitamins, herbs, eye drops, creams, and over-the-counter medicines.  Any problems you or family members have had with anesthetic medicines.  Any blood disorders you have.  Any surgeries you have had.  Any medical conditions you have.  Whether you are pregnant or may be pregnant. What are the risks? Generally, this is a safe test. However, problems may occur, such as:  Serious chest pain and heart attack. This is only a risk if the stress portion of the test is done.  Rapid heartbeat.  A feeling of warmth in your chest. This feeling usually does not last long.  Allergic reaction to the tracer. What happens before the test?  Ask your doctor about changing or stopping your normal medicines. This is important.  Follow instructions from your doctor about what you cannot eat or drink.  Remove your jewelry on the day of the test. What happens during the test?  An IV tube will be inserted into one of your veins.  Your doctor will give you a small amount of tracer through the  IV tube.  You will wait for 20-40 minutes while the tracer moves through your bloodstream.  Your heart will be monitored with an electrocardiogram (ECG).  You will lie down on an exam table.  Pictures of your heart will be taken for about 15-20 minutes.  You may also have a stress test. For this test, one of these things may be done: ? You will be asked to exercise on a treadmill or a stationary bike. ? You will be given medicines that will make your heart work harder. This is done if you are unable to exercise.  When blood flow to your heart has peaked, a tracer will again be given through the IV tube.  After 20-40 minutes, you will get back on the exam table.  More pictures will be taken of your heart.  Depending on the tracer that is used, more pictures may need to be taken 3-4 hours later.  Your IV tube will be removed when the test is over. The test may vary among doctors and hospitals. What happens after the test?  Ask your doctor: ? Whether you can return to your normal schedule, including diet, activities, and medicines. ? Whether you should drink more fluids. This will help to remove the tracer from your body. Drink enough fluid to keep your pee (urine) pale yellow.  Ask your doctor, or the department that is doing the test: ? When will my results be ready? ? How will I get my results? Summary  A cardiac nuclear scan is a test that is done to check the flow of blood to your heart.  Tell your doctor whether you are pregnant or may be pregnant.  Before the test, ask your doctor about changing or stopping your normal medicines. This is important.  Ask your doctor whether you can return to your normal activities. You may be asked to drink more fluids. This information is not intended to replace advice given to you by your health care provider. Make sure you discuss any questions you have with your health care provider. Document Revised: 06/13/2018 Document Reviewed:  08/07/2017 Elsevier Patient Education  [place], [place], [place]  05/16/2019 12:21 PM    [facility] [address], [place], [place]  [address] Phone: [phone]; Fax: [phone]

## 2019-05-17 ENCOUNTER — Ambulatory Visit (INDEPENDENT_AMBULATORY_CARE_PROVIDER_SITE_OTHER): Payer: Medicare HMO | Admitting: Vascular Surgery

## 2019-05-17 ENCOUNTER — Encounter: Payer: Self-pay | Admitting: Vascular Surgery

## 2019-05-17 VITALS — BP 99/66 | HR 87 | Temp 97.5°F | Resp 20 | Ht 61.0 in | Wt 141.9 lb

## 2019-05-17 DIAGNOSIS — I739 Peripheral vascular disease, unspecified: Secondary | ICD-10-CM

## 2019-05-17 NOTE — Progress Notes (Signed)
Patient ID: Wendy [name], female   DOB: 13-Dec-1950, 69 y.o.   MRN: [id number]  Reason for Consult: Follow-up   Referred by Josetta Huddle, MD  Subjective:     HPI:  Wendy [name] is a 69 y.o. female history of a left to right femorofemoral bypass.  This was found to be occluded she underwent angiography really has an occluded right common femoral artery.  Patient does take Eliquis is a former smoker.  Having significant right lower extremity pain even at rest keeps her up at night.  Does not have tissue loss or ulceration at this time.  She is scheduled to have stress test and follow-up with cardiology next week  Past Medical History:  Diagnosis Date  . Anemia   . Anginal pain ([place])    jaw pain 07/2016, s/p 07/15/16 nuclear stress test  . Anxiety   . Arthritis    "lower back" (08/24/2017)  . CAD (coronary artery disease), native coronary artery    a. Hx CABG 1998, last PCI 2013 b.LHC 07/2013:  EF 55-60%, L-LAD prob atretic, no sig disease in LAD, S-OM1 ok with patent stent, S-dRCA ok => med Rx. c. LHC 01/2015 DES to SVG-RCA.  Marland Kitchen Chronic back pain   . Chronic leg pain   . Chronic lower back pain   . COPD (chronic obstructive pulmonary disease) ([place])   . Depression   . DVT (deep venous thrombosis) ([facility]) X 3   RLE  . Fall 08/2016  . GERD (gastroesophageal reflux disease)   . Glucose intolerance (impaired glucose tolerance)   . Headache    "a few/month" (08/24/2017)  . HLD (hyperlipidemia)   . Hypertension   . Myocardial infarction ([place]) 1999  . OSA on CPAP   . PAD (peripheral artery disease) ([place])   . PAF (paroxysmal atrial fibrillation) ([place])    a. 09/2013 post-op b. 01/2015  . Peripheral neuropathy   . Shortness of breath    when walking  . Wears glasses    Family History  Problem Relation Age of Onset  . Lung cancer Mother   . Clotting disorder Father   . Lung cancer Sister   . Heart disease Brother   . Heart disease Brother   . Colon cancer Neg Hx   . Breast  cancer Neg Hx    Past Surgical History:  Procedure Laterality Date  . ABDOMINAL AORTOGRAM W/LOWER EXTREMITY Bilateral 04/29/2019   Procedure: ABDOMINAL AORTOGRAM W/LOWER EXTREMITY;  Surgeon: Waynetta Sandy, MD;  Location: [facility];  Service: Cardiovascular;  Laterality: Bilateral;  . ANTERIOR CERVICAL DECOMP/DISCECTOMY FUSION  1991  . BACK SURGERY    . CARDIAC CATHETERIZATION N/A 01/22/2015   Procedure: Left Heart Cath and Coronary Angiography;  Surgeon: Troy Sine, MD;  Location: [facility];  Service: Cardiovascular;  Laterality: N/A;  . CARDIAC CATHETERIZATION N/A 01/22/2015   Procedure: Coronary Stent Intervention;  Surgeon: Troy Sine, MD;  Location: [facility];  Service: Cardiovascular;  Laterality: N/A;  3.0x12 xience prox SVG to RCA  . CARDIAC CATHETERIZATION  03/09/2000   hx/notes 03/20/2000  . CHOLECYSTECTOMY N/A 09/16/2013   Procedure: LAPAROSCOPIC CHOLECYSTECTOMY CONVERTED TO OPEN CHOLECYSTECTOMY WITH CHOLANGIOGRAM;  Surgeon: [name], MD;  Location: [facility];  Service: General;  Laterality: N/A;  . CORONARY ANGIOPLASTY WITH STENT PLACEMENT  11/2011, 02/2012, 01/2015   DES to SVG-RCA both times, In [place], [place], Dr. Bruce Donath  . [place]  LIMA-LAD, SVG-OM, SVG-RCA  . ERCP N/A 09/19/2013   Procedure: ENDOSCOPIC RETROGRADE CHOLANGIOPANCREATOGRAPHY (ERCP);  Surgeon: [name], MD;  Location: [facility];  Service: Endoscopy;  Laterality: N/A;  . FINGER SURGERY Right    ring finger "fused"  . FRACTURE SURGERY    . IRRIGATION AND DEBRIDEMENT ABSCESS Right 10/17/2013   Procedure: INCISION AND DRAINAGE RIGHT SUBCOSTAL WOUND;  Surgeon: [name], MD;  [facility];  Service: General;  Laterality: Right;  . JOINT REPLACEMENT    . KNEE ARTHROSCOPY Right 1998  . LEFT HEART CATH AND CORS/GRAFTS ANGIOGRAPHY N/A 07/25/2016   Procedure: Left Heart Cath and Cors/Grafts Angiography;  Surgeon: Jettie Booze, MD;  Location: [facility];  Service: Cardiovascular;  Laterality: N/A;  . LEFT HEART CATHETERIZATION WITH CORONARY/GRAFT ANGIOGRAM N/A 07/19/2013   Procedure: LEFT HEART CATHETERIZATION WITH Beatrix Fetters;  Surgeon: Leonie Man, MD;  Location: [facility];  Service: Cardiovascular;  Laterality: N/A;  . MASS EXCISION Right 06/25/2014   Procedure: EXCISION BUTTOCK MASS ;  Surgeon: Coralie Keens, MD;  Location: [facility];  Service: General;  Laterality: Right;  . ORIF HUMERUS FRACTURE Left 08/27/2013   Procedure: OPEN REDUCTION INTERNAL FIXATION (ORIF) LEFT HUMERUS ;  Surgeon: Rozanna Box, MD;  Location: [facility];  Service: Orthopedics;  Laterality: Left;  . POSTERIOR FUSION CERVICAL SPINE  1992  . THROMBECTOMY / EMBOLECTOMY FEMORAL ARTERY Right    hx/notes 03/20/2000  . TOTAL HIP ARTHROPLASTY Right 07/19/2016   Procedure: TOTAL HIP ARTHROPLASTY ANTERIOR APPROACH;  Surgeon: [name], MD;  Location: [facility];  Service: Orthopedics;  Laterality: Right;  . TOTAL HIP REVISION Right 08/22/2017   Procedure: TOTAL HIP REVISION;  Surgeon: [name], MD;  Location: [facility];  Service: Orthopedics;  Laterality: Right;    Short Social History:  Social History   Tobacco Use  . Smoking status: Former Smoker    Packs/day: 0.50    Years: 34.00    Pack years: 17.00    Types: Cigarettes    Quit date: 07/20/1998    Years since quitting: 20.8  . Smokeless tobacco: Never Used  Substance Use Topics  . Alcohol use: Not Currently    Allergies  Allergen Reactions  . Benadryl [Diphenhydramine] Shortness Of Breath  . Penicillins Diarrhea    Did it involve swelling of the face/tongue/throat, SOB, or low BP? No Did it involve sudden or severe rash/hives, skin peeling, or any reaction on the inside of your mouth or nose? No Did you need to seek medical attention at a hospital or doctor's office? No When did it last happen?2 months If all above  answers are "NO", may proceed with cephalosporin use.  . Adhesive [Tape] Other (See Comments)    Burning of skin  . Amoxicillin Diarrhea and Other (See Comments)    Has patient had a PCN reaction causing immediate rash, facial/tongue/throat swelling, SOB or lightheadedness with hypotension: No Has patient had a PCN reaction causing severe rash involving mucus membranes or skin necrosis: No Has patient had a PCN reaction that required hospitalization No Has patient had a PCN reaction occurring within the last 10 years: Yes -- noted rxn as diarrhea If all of the above answers are "NO", then may proceed with Cephalosporin use.   . Cyclobenzaprine Other (See Comments)    Causes restless leg syndrome Restless syndrome  . Hydrocodone Itching    Current Outpatient Medications  Medication Sig Dispense Refill  . albuterol (  PROVENTIL HFA;VENTOLIN HFA) 108 (90 Base) MCG/ACT inhaler Inhale 2 puffs into the lungs every 6 (six) hours as needed for wheezing or shortness of breath. 1 each 1  . ALPRAZolam (XANAX) 0.5 MG tablet Take 0.5 mg by mouth 3 (three) times daily as needed for anxiety.    Marland Kitchen apixaban (ELIQUIS) 5 MG TABS tablet Take 1 tablet (5 mg total) by mouth 2 (two) times daily. 180 tablet 1  . atorvastatin (LIPITOR) 80 MG tablet TAKE ONE TABLET BY MOUTH DAILY AT 6PM 90 tablet 3  . buPROPion (WELLBUTRIN XL) 150 MG 24 hr tablet Take 150 mg by mouth daily.    . clopidogrel (PLAVIX) 75 MG tablet Take 75 mg by mouth daily.    . diclofenac sodium (VOLTAREN) 1 % GEL Apply 1 g topically daily as needed (pain).    Marland Kitchen ezetimibe (ZETIA) 10 MG tablet Take 10 mg by mouth daily.    . furosemide (LASIX) 20 MG tablet Take 1 tablet (20 mg total) by mouth daily as needed. 30 tablet 3  . isosorbide mononitrate (IMDUR) 60 MG 24 hr tablet Take 1.5 tablets (90 mg total) by mouth every morning. 135 tablet 0  . metoprolol tartrate (LOPRESSOR) 50 MG tablet Take 1 tablet (50 mg total) by mouth 2 (two) times daily. 180  tablet 3  . nitroGLYCERIN (NITROSTAT) 0.4 MG SL tablet Place 1 tablet (0.4 mg total) under the tongue every 5 (five) minutes as needed for chest pain. 25 tablet 5  . Oxycodone HCl 10 MG TABS Take 10 mg by mouth 3 (three) times daily.    . pantoprazole (PROTONIX) 40 MG tablet Take 1 tablet (40 mg total) by mouth daily. 90 tablet 3  . pramipexole (MIRAPEX) 0.25 MG tablet Take 0.25 mg by mouth at bedtime.     . ranolazine (RANEXA) 1000 MG SR tablet Take 1,000 mg by mouth 2 (two) times daily.    . sertraline (ZOLOFT) 100 MG tablet Take 150 mg by mouth at bedtime.     . valsartan-hydrochlorothiazide (DIOVAN-HCT) 160-12.5 MG tablet Take 1 tablet by mouth daily.      Current Facility-Administered Medications  Medication Dose Route Frequency Provider Last Rate Last Admin  . ferrous sulfate tablet 325 mg  325 mg Oral BID WC Irene Shipper, MD        Review of Systems  Constitutional:  Constitutional negative. Eyes: Eyes negative.  Respiratory: Respiratory negative.  GI: Gastrointestinal negative.  Musculoskeletal: Positive for leg pain.  Skin: Skin negative.  Neurological: Neurological negative. Hematologic: Hematologic/lymphatic negative.  Psychiatric: Psychiatric negative.        Objective:  Objective   Vitals:   05/17/19 0944  BP: 99/66  Pulse: 87  Resp: 20  Temp: (!) 97.5 F (36.4 C)  SpO2: 95%  Weight: 141 lb 14.4 oz (64.4 kg)  Height: 5\' 1"  (1.549 m)   Body mass index is 26.81 kg/m.  Physical Exam HENT:     Head: Normocephalic.     Nose:     Comments: Wearing a mask Eyes:     Pupils: Pupils are equal, round, and reactive to light.  Cardiovascular:     Pulses:          Femoral pulses are 0 on the right side and 2+ on the left side. Pulmonary:     Effort: Pulmonary effort is normal.  Abdominal:     General: Abdomen is flat.     Palpations: Abdomen is soft.  Musculoskeletal:  General: Normal range of motion.  Skin:    General: Skin is warm and dry.      Capillary Refill: Capillary refill takes more than 3 seconds.  Neurological:     General: No focal deficit present.     Mental Status: She is alert.  Psychiatric:        Mood and Affect: Mood normal.        Behavior: Behavior normal.        Thought Content: Thought content normal.        Judgment: Judgment normal.         Assessment/Plan:     69 year old female with occluded left to right femorofemoral bypass.  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  Ideally the treatment for her would be aortobifemoral bypass.  She is to get cardiac clearance will have a phone visit with her next week.  Options include right axillary femoral bypass versus redo femorofemoral if we cannot perform aortobifemoral bypass due to coronary artery disease.     Waynetta Sandy MD [facility]

## 2019-05-20 ENCOUNTER — Telehealth (HOSPITAL_COMMUNITY): Payer: Self-pay | Admitting: *Deleted

## 2019-05-20 NOTE — Telephone Encounter (Signed)
Patient given detailed instructions per Myocardial Perfusion Study Information Sheet for the test on 05/22/19. Patient notified to arrive 15 minutes early and that it is imperative to arrive on time for appointment to keep from having the test rescheduled.  If you need to cancel or reschedule your appointment, please call the office within 24 hours of your appointment. . Patient verbalized understanding. Kirstie Peri

## 2019-05-22 ENCOUNTER — Ambulatory Visit (HOSPITAL_COMMUNITY): Payer: Medicare HMO | Attending: Internal Medicine

## 2019-05-22 ENCOUNTER — Other Ambulatory Visit: Payer: Self-pay

## 2019-05-22 DIAGNOSIS — R079 Chest pain, unspecified: Secondary | ICD-10-CM | POA: Insufficient documentation

## 2019-05-22 LAB — MYOCARDIAL PERFUSION IMAGING
LV dias vol: 95 mL (ref 46–106)
LV sys vol: 38 mL
Peak HR: 77 {beats}/min
RATE: 0.33
Rest HR: 62 {beats}/min
SDS: 5
SRS: 9
SSS: 14
TID: 0.95

## 2019-05-22 MED ORDER — TECHNETIUM TC 99M TETROFOSMIN IV KIT
9.7000 | PACK | Freq: Once | INTRAVENOUS | Status: AC | PRN
  Administered 2019-05-22: 9.7 via INTRAVENOUS
  Filled 2019-05-22: qty 10

## 2019-05-22 MED ORDER — TECHNETIUM TC 99M TETROFOSMIN IV KIT
31.6000 | PACK | Freq: Once | INTRAVENOUS | Status: AC | PRN
  Administered 2019-05-22: 31.6 via INTRAVENOUS
  Filled 2019-05-22: qty 32

## 2019-05-22 MED ORDER — REGADENOSON 0.4 MG/5ML IV SOLN
0.4000 mg | Freq: Once | INTRAVENOUS | Status: AC
  Administered 2019-05-22: 0.4 mg via INTRAVENOUS

## 2019-05-24 ENCOUNTER — Other Ambulatory Visit: Payer: Self-pay

## 2019-05-24 ENCOUNTER — Encounter: Payer: Self-pay | Admitting: Vascular Surgery

## 2019-05-24 ENCOUNTER — Ambulatory Visit (INDEPENDENT_AMBULATORY_CARE_PROVIDER_SITE_OTHER): Payer: Medicare HMO | Admitting: Vascular Surgery

## 2019-05-24 DIAGNOSIS — I739 Peripheral vascular disease, unspecified: Secondary | ICD-10-CM | POA: Diagnosis not present

## 2019-05-24 NOTE — Progress Notes (Signed)
Virtual Visit via Telephone Note   I connected with [name] on 05/24/2019 using the [email or web address] by telephone and verified that I was speaking with the correct person using two identifiers. Patient was located at home.   The limitations of evaluation and management by telemedicine and the availability of in person appointments have been previously discussed with the patient and are documented in the patients chart. The patient expressed understanding and consented to proceed.   Chief Complaint: Right leg pain and numbness  History of Present Illness: Wendy [name] is a 69 y.o. female with with right lower extremity pain and numbness.  She is a failed femorofemoral bypass.  She has undergone stress test which was undetermined.  She is not having any chest pain.  Continues to have numbness and pain no tissue loss or ulceration.  Past Medical History:  Diagnosis Date  . Anemia   . Anginal pain ([place])    jaw pain 07/2016, s/p 07/15/16 nuclear stress test  . Anxiety   . Arthritis    "lower back" (08/24/2017)  . CAD (coronary artery disease), native coronary artery    a. Hx CABG 1998, last PCI 2013 b.LHC 07/2013:  EF 55-60%, L-LAD prob atretic, no sig disease in LAD, S-OM1 ok with patent stent, S-dRCA ok => med Rx. c. LHC 01/2015 DES to SVG-RCA.  Marland Kitchen Chronic back pain   . Chronic leg pain   . Chronic lower back pain   . COPD (chronic obstructive pulmonary disease) ([place])   . Depression   . DVT (deep venous thrombosis) ([facility]) X 3   RLE  . Fall 08/2016  . GERD (gastroesophageal reflux disease)   . Glucose intolerance (impaired glucose tolerance)   . Headache    "a few/month" (08/24/2017)  . HLD (hyperlipidemia)   . Hypertension   . Myocardial infarction ([place]) 1999  . OSA on CPAP   . PAD (peripheral artery disease) ([place])   . PAF (paroxysmal atrial fibrillation) ([place])    a. 09/2013 post-op b. 01/2015  . Peripheral neuropathy   . Shortness of breath    when walking  . Wears  glasses     Past Surgical History:  Procedure Laterality Date  . ABDOMINAL AORTOGRAM W/LOWER EXTREMITY Bilateral 04/29/2019   Procedure: ABDOMINAL AORTOGRAM W/LOWER EXTREMITY;  Surgeon: Waynetta Sandy, MD;  Location: [facility];  Service: Cardiovascular;  Laterality: Bilateral;  . ANTERIOR CERVICAL DECOMP/DISCECTOMY FUSION  1991  . BACK SURGERY    . CARDIAC CATHETERIZATION N/A 01/22/2015   Procedure: Left Heart Cath and Coronary Angiography;  Surgeon: Troy Sine, MD;  Location: [facility];  Service: Cardiovascular;  Laterality: N/A;  . CARDIAC CATHETERIZATION N/A 01/22/2015   Procedure: Coronary Stent Intervention;  Surgeon: Troy Sine, MD;  Location: [facility];  Service: Cardiovascular;  Laterality: N/A;  3.0x12 xience prox SVG to RCA  . CARDIAC CATHETERIZATION  03/09/2000   hx/notes 03/20/2000  . CHOLECYSTECTOMY N/A 09/16/2013   Procedure: LAPAROSCOPIC CHOLECYSTECTOMY CONVERTED TO OPEN CHOLECYSTECTOMY WITH CHOLANGIOGRAM;  Surgeon: [name], MD;  Location: [facility];  Service: General;  Laterality: N/A;  . CORONARY ANGIOPLASTY WITH STENT PLACEMENT  11/2011, 02/2012, 01/2015   DES to SVG-RCA both times, In [place], [place], Dr. Bruce Donath  . CORONARY ARTERY BYPASS GRAFT  1998   LIMA-LAD, SVG-OM, SVG-RCA  . ERCP N/A 09/19/2013   Procedure: ENDOSCOPIC RETROGRADE CHOLANGIOPANCREATOGRAPHY (ERCP);  Surgeon: [name], MD;  Location: [facility];  Service: Endoscopy;  Laterality: N/A;  . FINGER SURGERY Right    ring finger "fused"  . FRACTURE SURGERY    . IRRIGATION AND DEBRIDEMENT ABSCESS Right 10/17/2013   Procedure: INCISION AND DRAINAGE RIGHT SUBCOSTAL WOUND;  Surgeon: [name], MD;  [facility];  Service: General;  Laterality: Right;  . JOINT REPLACEMENT    . KNEE ARTHROSCOPY Right 1998  . LEFT HEART CATH AND CORS/GRAFTS ANGIOGRAPHY N/A 07/25/2016   Procedure: Left Heart Cath and Cors/Grafts Angiography;  Surgeon: Jettie Booze, MD;  Location: [facility];  Service: Cardiovascular;  Laterality: N/A;  . LEFT HEART CATHETERIZATION WITH CORONARY/GRAFT ANGIOGRAM N/A 07/19/2013   Procedure: LEFT HEART CATHETERIZATION WITH Beatrix Fetters;  Surgeon: Leonie Man, MD;  Location: [facility];  Service: Cardiovascular;  Laterality: N/A;  . MASS EXCISION Right 06/25/2014   Procedure: EXCISION BUTTOCK MASS ;  Surgeon: Coralie Keens, MD;  Location: [facility];  Service: General;  Laterality: Right;  . ORIF HUMERUS FRACTURE Left 08/27/2013   Procedure: OPEN REDUCTION INTERNAL FIXATION (ORIF) LEFT HUMERUS ;  Surgeon: Rozanna Box, MD;  Location: [facility];  Service: Orthopedics;  Laterality: Left;  . POSTERIOR FUSION CERVICAL SPINE  1992  . THROMBECTOMY / EMBOLECTOMY FEMORAL ARTERY Right    hx/notes 03/20/2000  . TOTAL HIP ARTHROPLASTY Right 07/19/2016   Procedure: TOTAL HIP ARTHROPLASTY ANTERIOR APPROACH;  Surgeon: [name], MD;  Location: [facility];  Service: Orthopedics;  Laterality: Right;  . TOTAL HIP REVISION Right 08/22/2017   Procedure: TOTAL HIP REVISION;  Surgeon: [name], MD;  Location: [facility];  Service: Orthopedics;  Laterality: Right;    Current Meds  Medication Sig  . albuterol (PROVENTIL HFA;VENTOLIN HFA) 108 (90 Base) MCG/ACT inhaler Inhale 2 puffs into the lungs every 6 (six) hours as needed for wheezing or shortness of breath.  . ALPRAZolam (XANAX) 0.5 MG tablet Take 0.5 mg by mouth 3 (three) times daily as needed for anxiety.  Marland Kitchen apixaban (ELIQUIS) 5 MG TABS tablet Take 1 tablet (5 mg total) by mouth 2 (two) times daily.  Marland Kitchen atorvastatin (LIPITOR) 80 MG tablet TAKE ONE TABLET BY MOUTH DAILY AT 6PM  . buPROPion (WELLBUTRIN XL) 150 MG 24 hr tablet Take 150 mg by mouth daily.  . clopidogrel (PLAVIX) 75 MG tablet Take 75 mg by mouth daily.  . diclofenac sodium (VOLTAREN) 1 % GEL Apply 1 g topically daily as needed (pain).  Marland Kitchen ezetimibe (ZETIA) 10 MG tablet Take  10 mg by mouth daily.  . furosemide (LASIX) 20 MG tablet Take 1 tablet (20 mg total) by mouth daily as needed.  . isosorbide mononitrate (IMDUR) 60 MG 24 hr tablet Take 1.5 tablets (90 mg total) by mouth every morning.  . metoprolol tartrate (LOPRESSOR) 50 MG tablet Take 1 tablet (50 mg total) by mouth 2 (two) times daily.  . nitroGLYCERIN (NITROSTAT) 0.4 MG SL tablet Place 1 tablet (0.4 mg total) under the tongue every 5 (five) minutes as needed for chest pain.  . Oxycodone HCl 10 MG TABS Take 10 mg by mouth 3 (three) times daily.  . pantoprazole (PROTONIX) 40 MG tablet Take 1 tablet (40 mg total) by mouth daily.  . pramipexole (MIRAPEX) 0.25 MG tablet Take 0.25 mg by mouth at bedtime.   . ranolazine (RANEXA) 1000 MG SR tablet Take 1,000 mg by mouth 2 (two) times daily.  . sertraline (ZOLOFT) 100 MG tablet Take 150 mg by mouth at bedtime.   . valsartan-hydrochlorothiazide (  DIOVAN-HCT) 160-12.5 MG tablet Take 1 tablet by mouth daily.    Current Facility-Administered Medications for the 05/24/19 encounter (Office Visit) with Waynetta Sandy, MD  Medication  . ferrous sulfate tablet 325 mg    12 system ROS was negative unless otherwise noted in HPI   Observations/Objective: Patient demonstrates good understanding.  Assessment and Plan: 69 year old female in need of aortobifemoral bypass.  Less ideally she could have right axillofemoral bypass versus redo femorofemoral bypass if she is not cleared from a cardiac standpoint.  Appears she will need CCTA for further evaluation and we can schedule surgery accordingly pending results.  I discussed the assessment and treatment plan with the patient. The patient was provided an opportunity to ask questions and all were answered. The patient agreed with the plan and demonstrated an understanding of the instructions.   The patient was advised to call back or seek an in-person evaluation if the symptoms worsen or if the condition fails to  improve as anticipated.  I spent 5 minutes with the patient via telephone encounter and in chart review.   Signed, Servando Snare [facility] [place] Office: [phone]  05/24/2019, 10:03 AM

## 2019-05-29 ENCOUNTER — Ambulatory Visit: Payer: Medicare HMO | Admitting: Cardiovascular Disease

## 2019-05-30 ENCOUNTER — Other Ambulatory Visit: Payer: Self-pay | Admitting: Nurse Practitioner

## 2019-05-30 ENCOUNTER — Telehealth: Payer: Self-pay | Admitting: Nurse Practitioner

## 2019-05-30 DIAGNOSIS — I25110 Atherosclerotic heart disease of native coronary artery with unstable angina pectoris: Secondary | ICD-10-CM

## 2019-05-30 DIAGNOSIS — I25708 Atherosclerosis of coronary artery bypass graft(s), unspecified, with other forms of angina pectoris: Secondary | ICD-10-CM

## 2019-05-30 DIAGNOSIS — I5042 Chronic combined systolic (congestive) and diastolic (congestive) heart failure: Secondary | ICD-10-CM

## 2019-05-30 NOTE — Telephone Encounter (Signed)
I've discussed the case with Dr. Donzetta Matters.  She has known severe peripheral vascular disea and known severe coronary artery disease.  The Myoview study was technically difficult study.  There is no significant ischemia but she does have lots of areas of severe scarring.  She is continued to have episodes of chest pain that are consistent with ischemia relieved with nitroglycerin.  I think our best plan is to repeat her heart catheterization and echocardiogram to assess her LV function.  Will be able to make it better preoperative assessment after these test.  I tried to call the patient by phone tonight but did not get an answer.  I will try calling her tomorrow to discuss further.

## 2019-05-30 NOTE — Telephone Encounter (Signed)
-----   Message from Fay Records, MD sent at 05/24/2019  4:23 PM EDT ----- Forward to PNahser for review /further recommendations

## 2019-05-31 NOTE — Telephone Encounter (Signed)
Reviewed instructions with patient.  She is scheduled for lab work and [employer] screening on Monday 3/29.  She verbalized understanding and agreement with plan of care and is aware to call back with additional questions or concerns.    [place] [place] OFFICE [place], [place] [address] Dept: [phone] Loc: [place]  05/31/2019  You are scheduled for a Cardiac Catheterization on Wednesday, March 31 with Dr. [name].  1. Please arrive at the [facility] (Main Entrance A) at [facility]: [address] [place], [place] [address] at 9:30 AM (This time is two hours before your procedure to ensure your preparation). Free valet parking service is available.   Special note: Every effort is made to have your procedure done on time. Please understand that emergencies sometimes delay scheduled procedures.  2. Diet: Do not eat solid foods after midnight.  The patient may have clear liquids until 5am upon the day of the procedure.  3. Labs: You will need to have blood drawn on Monday, March 29 at [facility] at [facility]. [address]  Open: 7:30am - 5pm    Phone: [phone]. You do not need to be fasting.  Your Pre-procedure COVID-19 Testing will be done on Monday March 29 at 10:45 am at [facility] at [id number], [place], [place] [address]. Once you arrive at the testing site, stay in the right hand lane, go under the building overhang not the tent. If you are tested under the tent your results may not be back before your procedure. Please be on time for your appointment.  After your swab you will be given a mask to wear and instructed to go home and quarantine/no visitors until after your procedure. If you test positive you will be notified and your procedure will be cancelled.    4. Medication  instructions in preparation for your procedure:   Contrast Allergy: No   TAKE your last dose of Eliquis on Monday morning - do not restart until after your catheterization  Stop taking, Lasix (Furosemide)  Tuesday, March 30,   On the morning of your procedure, take your Plavix/Clopidogrel and any morning medicines NOT listed above.  You may use sips of water.  5. Plan for one night stay--bring personal belongings. 6. Bring a current list of your medications and current insurance cards. 7. You MUST have a responsible person to drive you home. 8. Someone MUST be with you the first 24 hours after you arrive home or your discharge will be delayed. 9. Please wear clothes that are easy to get on and off and wear slip-on shoes.  Thank you for allowing [place] to care for you!   -- [facility]

## 2019-05-31 NOTE — Telephone Encounter (Signed)
We will schedule cardiac cath on Wendy [name] as part of her pre op evaluation prior to her extensive vascular surgery . We have discussed the risks , benefits, options of cath. She understands and agrees to proceed. Marland Kitchen

## 2019-06-03 ENCOUNTER — Other Ambulatory Visit: Payer: Medicare HMO

## 2019-06-03 ENCOUNTER — Other Ambulatory Visit (HOSPITAL_COMMUNITY): Payer: Medicare HMO

## 2019-06-04 ENCOUNTER — Telehealth: Payer: Self-pay | Admitting: *Deleted

## 2019-06-04 ENCOUNTER — Other Ambulatory Visit: Payer: Medicare HMO

## 2019-06-04 ENCOUNTER — Other Ambulatory Visit (HOSPITAL_COMMUNITY)
Admission: RE | Admit: 2019-06-04 | Discharge: 2019-06-04 | Disposition: A | Discharge status: Home / Self Care | Payer: Medicare HMO | Source: Ambulatory Visit | Attending: Cardiovascular Disease | Admitting: Cardiovascular Disease

## 2019-06-04 ENCOUNTER — Ambulatory Visit: Payer: Medicare HMO | Attending: Internal Medicine

## 2019-06-04 ENCOUNTER — Other Ambulatory Visit: Payer: Self-pay

## 2019-06-04 DIAGNOSIS — I208 Other forms of angina pectoris: Secondary | ICD-10-CM | POA: Diagnosis not present

## 2019-06-04 DIAGNOSIS — I25110 Atherosclerotic heart disease of native coronary artery with unstable angina pectoris: Secondary | ICD-10-CM | POA: Diagnosis not present

## 2019-06-04 DIAGNOSIS — F322 Major depressive disorder, single episode, severe without psychotic features: Secondary | ICD-10-CM | POA: Diagnosis not present

## 2019-06-04 DIAGNOSIS — Z01810 Encounter for preprocedural cardiovascular examination: Secondary | ICD-10-CM

## 2019-06-04 DIAGNOSIS — I480 Paroxysmal atrial fibrillation: Secondary | ICD-10-CM | POA: Diagnosis not present

## 2019-06-04 DIAGNOSIS — E785 Hyperlipidemia, unspecified: Secondary | ICD-10-CM | POA: Diagnosis not present

## 2019-06-04 DIAGNOSIS — I10 Essential (primary) hypertension: Secondary | ICD-10-CM | POA: Diagnosis not present

## 2019-06-04 DIAGNOSIS — Z01812 Encounter for preprocedural laboratory examination: Secondary | ICD-10-CM | POA: Diagnosis not present

## 2019-06-04 DIAGNOSIS — Z20822 Contact with and (suspected) exposure to covid-19: Secondary | ICD-10-CM | POA: Diagnosis not present

## 2019-06-04 DIAGNOSIS — J449 Chronic obstructive pulmonary disease, unspecified: Secondary | ICD-10-CM | POA: Diagnosis not present

## 2019-06-04 DIAGNOSIS — I25810 Atherosclerosis of coronary artery bypass graft(s) without angina pectoris: Secondary | ICD-10-CM | POA: Diagnosis not present

## 2019-06-04 DIAGNOSIS — Z23 Encounter for immunization: Secondary | ICD-10-CM

## 2019-06-04 LAB — BASIC METABOLIC PANEL
BUN/Creatinine Ratio: 15 (ref 12–28)
BUN: 14 mg/dL (ref 8–27)
CO2: 30 mmol/L — ABNORMAL HIGH (ref 20–29)
Calcium: 9.4 mg/dL (ref 8.7–10.3)
Chloride: 105 mmol/L (ref 96–106)
Creatinine, Ser: 0.96 mg/dL (ref 0.57–1.00)
GFR calc Af Amer: 71 mL/min/{1.73_m2} (ref >59)
GFR calc non Af Amer: 61 mL/min/{1.73_m2} (ref >59)
Glucose: 126 mg/dL — ABNORMAL HIGH (ref 65–99)
Potassium: 4.2 mmol/L (ref 3.5–5.2)
Sodium: 141 mmol/L (ref 134–144)

## 2019-06-04 LAB — CBC
Hematocrit: 41.7 % (ref 34.0–46.6)
Hemoglobin: 13.4 g/dL (ref 11.1–15.9)
MCH: 27.7 pg (ref 26.6–33.0)
MCHC: 32.1 g/dL (ref 31.5–35.7)
MCV: 86 fL (ref 79–97)
Platelets: 205 10*3/uL (ref 150–450)
RBC: 4.83 x10E6/uL (ref 3.77–5.28)
RDW: 17.4 % — ABNORMAL HIGH (ref 11.7–15.4)
WBC: 5.3 10*3/uL (ref 3.4–10.8)

## 2019-06-04 LAB — SARS CORONAVIRUS 2 (TAT 6-24 HRS): SARS Coronavirus 2: NEGATIVE

## 2019-06-04 NOTE — Telephone Encounter (Signed)
    COVID-19 Pre-Screening Questions:  . In the past 7 to 10 days have you had a cough,  shortness of breath, headache, congestion, fever (100 or greater) body aches, chills, sore throat, or sudden loss of taste or sense of smell? no . Have you been around anyone with known Covid 19 in the past 7-10 days? no . Have you been around anyone who is awaiting Covid 19 test results in the past 7 to 10 days? no . Have you been around anyone who has been exposed to Covid 19, or has mentioned symptoms of Covid 19 within the past 7 to 10 days? No  I reviewed procedure/mask/visitor instructions, COVID-19 screening questions with patient.

## 2019-06-04 NOTE — Telephone Encounter (Signed)
Voicemail message, no answer. 

## 2019-06-04 NOTE — Progress Notes (Signed)
   Covid-19 Vaccination Clinic  Name:  Wendy [name]    MRN: [id number] DOB: 1950/07/24  06/04/2019  Wendy [name] was observed post Covid-19 immunization for 15 minutes without incident. She was provided with Vaccine Information Sheet and instruction to access the V-Safe system.   Wendy [name] was instructed to call 911 with any severe reactions post vaccine: Marland Kitchen Difficulty breathing  . Swelling of face and throat  . A fast heartbeat  . A bad rash all over body  . Dizziness and weakness   Immunizations Administered    Name Date Dose VIS Date Route   Pfizer COVID-19 Vaccine 06/04/2019 11:18 AM 0.3 mL 02/15/2019 Intramuscular   Manufacturer: [place]   Lot: [id number]   [place]: [id number]

## 2019-06-04 NOTE — Telephone Encounter (Addendum)
Pt states she was unaware of appts for pre procedure BMP/CBC/COVID-19 test yesterday. Pt is aware these appointments have been rescheduled for this morning ASAP. Pt  knows okay to go for vaccine appt (considered medical appt) at 11:30 AM after pre procedure tests this morning, wear mask and quarantine after vaccine appointment.  Pt advised I will follow up with her later today and review procedure instructions.

## 2019-06-04 NOTE — Telephone Encounter (Addendum)
Pt contacted pre-catheterization scheduled at [facility] for: Wednesday June 05, 2019 11:30 AM Verified arrival time and place: [facility] at: 9:30 AM   No solid food after midnight prior to cath, clear liquids until 5 AM day of procedure. Contrast allergy: no  Hold: Eliquis-last dose AM 06/02/19 until post procedure. Lasix-AM of procedure-pt states she does not take regularly Valsartan-HCT-AM of procedure.  Except hold medications AM meds can be  taken pre-cath with sip of water including: ASA 81 mg Plavix 75 mg   Confirmed patient has responsible adult to drive home post procedure and observe 24 hours after arriving home:   Currently, due to Covid-19 pandemic, only one person will be allowed with patient. Must be the same person for patient's entire stay and will be required to wear a mask. They will be asked to wait in the waiting room for the duration of the patient's stay.  Patients are required to wear a mask when they enter the hospital.  06/04/19 0740 -it does not look like patient arrived for pre procedure BMP/CBC/COVID-19 test scheduled for 06/03/19.  LMTCB ASAP after 0800 for pt to discuss-BMP/CBC should be ordered STAT for today.

## 2019-06-05 ENCOUNTER — Encounter (HOSPITAL_COMMUNITY)
Admission: RE | Disposition: A | Discharge status: Home / Self Care | Payer: Medicare HMO | Source: Home / Self Care | Attending: Cardiovascular Disease

## 2019-06-05 ENCOUNTER — Ambulatory Visit (HOSPITAL_COMMUNITY)
Admission: RE | Admit: 2019-06-05 | Discharge: 2019-06-05 | Disposition: A | Discharge status: Home / Self Care | Payer: Medicare HMO | Attending: Cardiovascular Disease | Admitting: Cardiovascular Disease

## 2019-06-05 DIAGNOSIS — Z79899 Other long term (current) drug therapy: Secondary | ICD-10-CM | POA: Diagnosis not present

## 2019-06-05 DIAGNOSIS — Z885 Allergy status to narcotic agent status: Secondary | ICD-10-CM | POA: Insufficient documentation

## 2019-06-05 DIAGNOSIS — I252 Old myocardial infarction: Secondary | ICD-10-CM | POA: Diagnosis not present

## 2019-06-05 DIAGNOSIS — Z87891 Personal history of nicotine dependence: Secondary | ICD-10-CM | POA: Diagnosis not present

## 2019-06-05 DIAGNOSIS — I739 Peripheral vascular disease, unspecified: Secondary | ICD-10-CM | POA: Insufficient documentation

## 2019-06-05 DIAGNOSIS — Z86718 Personal history of other venous thrombosis and embolism: Secondary | ICD-10-CM | POA: Diagnosis not present

## 2019-06-05 DIAGNOSIS — I25118 Atherosclerotic heart disease of native coronary artery with other forms of angina pectoris: Secondary | ICD-10-CM

## 2019-06-05 DIAGNOSIS — J449 Chronic obstructive pulmonary disease, unspecified: Secondary | ICD-10-CM | POA: Diagnosis not present

## 2019-06-05 DIAGNOSIS — I480 Paroxysmal atrial fibrillation: Secondary | ICD-10-CM | POA: Insufficient documentation

## 2019-06-05 DIAGNOSIS — Z7902 Long term (current) use of antithrombotics/antiplatelets: Secondary | ICD-10-CM | POA: Diagnosis not present

## 2019-06-05 DIAGNOSIS — Z955 Presence of coronary angioplasty implant and graft: Secondary | ICD-10-CM | POA: Diagnosis not present

## 2019-06-05 DIAGNOSIS — I25790 Atherosclerosis of other coronary artery bypass graft(s) with unstable angina pectoris: Secondary | ICD-10-CM | POA: Insufficient documentation

## 2019-06-05 DIAGNOSIS — Z951 Presence of aortocoronary bypass graft: Secondary | ICD-10-CM | POA: Insufficient documentation

## 2019-06-05 DIAGNOSIS — Z7901 Long term (current) use of anticoagulants: Secondary | ICD-10-CM | POA: Diagnosis not present

## 2019-06-05 DIAGNOSIS — I2584 Coronary atherosclerosis due to calcified coronary lesion: Secondary | ICD-10-CM | POA: Diagnosis not present

## 2019-06-05 DIAGNOSIS — G4733 Obstructive sleep apnea (adult) (pediatric): Secondary | ICD-10-CM | POA: Diagnosis not present

## 2019-06-05 DIAGNOSIS — K219 Gastro-esophageal reflux disease without esophagitis: Secondary | ICD-10-CM | POA: Diagnosis not present

## 2019-06-05 DIAGNOSIS — Z8249 Family history of ischemic heart disease and other diseases of the circulatory system: Secondary | ICD-10-CM | POA: Diagnosis not present

## 2019-06-05 DIAGNOSIS — Z96641 Presence of right artificial hip joint: Secondary | ICD-10-CM | POA: Insufficient documentation

## 2019-06-05 DIAGNOSIS — I25718 Atherosclerosis of autologous vein coronary artery bypass graft(s) with other forms of angina pectoris: Secondary | ICD-10-CM

## 2019-06-05 DIAGNOSIS — I10 Essential (primary) hypertension: Secondary | ICD-10-CM | POA: Diagnosis not present

## 2019-06-05 DIAGNOSIS — E785 Hyperlipidemia, unspecified: Secondary | ICD-10-CM | POA: Insufficient documentation

## 2019-06-05 DIAGNOSIS — Z880 Allergy status to penicillin: Secondary | ICD-10-CM | POA: Insufficient documentation

## 2019-06-05 DIAGNOSIS — Z96651 Presence of right artificial knee joint: Secondary | ICD-10-CM | POA: Diagnosis not present

## 2019-06-05 DIAGNOSIS — Z888 Allergy status to other drugs, medicaments and biological substances status: Secondary | ICD-10-CM | POA: Diagnosis not present

## 2019-06-05 HISTORY — PX: LEFT HEART CATH AND CORS/GRAFTS ANGIOGRAPHY: CATH118250

## 2019-06-05 SURGERY — LEFT HEART CATH AND CORS/GRAFTS ANGIOGRAPHY
Anesthesia: LOCAL

## 2019-06-05 MED ORDER — FENTANYL CITRATE (PF) 100 MCG/2ML IJ SOLN
INTRAMUSCULAR | Status: AC
  Filled 2019-06-05: qty 2

## 2019-06-05 MED ORDER — ACETAMINOPHEN 325 MG PO TABS: 650.0000 mg | ORAL_TABLET | ORAL | Status: DC | PRN

## 2019-06-05 MED ORDER — LABETALOL HCL 5 MG/ML IV SOLN: 10.0000 mg | INTRAVENOUS | Status: DC | PRN

## 2019-06-05 MED ORDER — SODIUM CHLORIDE 0.9% FLUSH: 3.0000 mL | Freq: Two times a day (BID) | INTRAVENOUS | Status: DC

## 2019-06-05 MED ORDER — CLOPIDOGREL BISULFATE 75 MG PO TABS: 75.0000 mg | ORAL_TABLET | ORAL | Status: DC

## 2019-06-05 MED ORDER — SODIUM CHLORIDE 0.9% FLUSH: 3.0000 mL | INTRAVENOUS | Status: DC | PRN

## 2019-06-05 MED ORDER — MIDAZOLAM HCL 2 MG/2ML IJ SOLN
INTRAMUSCULAR | Status: DC | PRN
  Administered 2019-06-05: 1 mg via INTRAVENOUS
  Administered 2019-06-05: 2 mg via INTRAVENOUS

## 2019-06-05 MED ORDER — MIDAZOLAM HCL 2 MG/2ML IJ SOLN
INTRAMUSCULAR | Status: AC
  Filled 2019-06-05: qty 2

## 2019-06-05 MED ORDER — SODIUM CHLORIDE 0.9 % IV SOLN: INTRAVENOUS | Status: AC

## 2019-06-05 MED ORDER — SODIUM CHLORIDE 0.9 % WEIGHT BASED INFUSION
1.0000 mL/kg/h | INTRAVENOUS | Status: DC
  Administered 2019-06-05: 1 mL/kg/h via INTRAVENOUS

## 2019-06-05 MED ORDER — LIDOCAINE HCL (PF) 1 % IJ SOLN
INTRAMUSCULAR | Status: DC | PRN
  Administered 2019-06-05: 20 mL via INTRADERMAL

## 2019-06-05 MED ORDER — FENTANYL CITRATE (PF) 100 MCG/2ML IJ SOLN
INTRAMUSCULAR | Status: DC | PRN
  Administered 2019-06-05: 25 ug via INTRAVENOUS
  Administered 2019-06-05: 50 ug via INTRAVENOUS
  Administered 2019-06-05: 25 ug via INTRAVENOUS

## 2019-06-05 MED ORDER — LIDOCAINE HCL (PF) 1 % IJ SOLN
INTRAMUSCULAR | Status: AC
  Filled 2019-06-05: qty 30

## 2019-06-05 MED ORDER — ONDANSETRON HCL 4 MG/2ML IJ SOLN: 4.0000 mg | Freq: Four times a day (QID) | INTRAMUSCULAR | Status: DC | PRN

## 2019-06-05 MED ORDER — ASPIRIN 81 MG PO CHEW: 81.0000 mg | CHEWABLE_TABLET | ORAL | Status: DC

## 2019-06-05 MED ORDER — HEPARIN (PORCINE) IN NACL 1000-0.9 UT/500ML-% IV SOLN
INTRAVENOUS | Status: DC | PRN
  Administered 2019-06-05 (×2): 500 mL

## 2019-06-05 MED ORDER — HEPARIN (PORCINE) IN NACL 1000-0.9 UT/500ML-% IV SOLN
INTRAVENOUS | Status: AC
  Filled 2019-06-05: qty 1000

## 2019-06-05 MED ORDER — SODIUM CHLORIDE 0.9 % IV SOLN: 250.0000 mL | INTRAVENOUS | Status: DC | PRN

## 2019-06-05 MED ORDER — IOHEXOL 350 MG/ML SOLN
INTRAVENOUS | Status: DC | PRN
  Administered 2019-06-05: 80 mL via INTRA_ARTERIAL

## 2019-06-05 MED ORDER — HYDRALAZINE HCL 20 MG/ML IJ SOLN: 10.0000 mg | INTRAMUSCULAR | Status: DC | PRN

## 2019-06-05 MED ORDER — OXYCODONE HCL 5 MG PO TABS: 10.0000 mg | ORAL_TABLET | Freq: Once | ORAL | Status: DC

## 2019-06-05 MED ORDER — OXYCODONE HCL 5 MG PO TABS
ORAL_TABLET | ORAL | Status: AC
  Administered 2019-06-05: 10 mg
  Filled 2019-06-05: qty 2

## 2019-06-05 MED ORDER — SODIUM CHLORIDE 0.9 % WEIGHT BASED INFUSION
3.0000 mL/kg/h | INTRAVENOUS | Status: AC
  Administered 2019-06-05: 3 mL/kg/h via INTRAVENOUS

## 2019-06-05 SURGICAL SUPPLY — 10 items
CATH INFINITI 5FR JL4 (CATHETERS) ×1 IMPLANT
CATH INFINITI 5FR MULTPACK ANG (CATHETERS) ×1 IMPLANT
KIT HEART LEFT (KITS) ×2 IMPLANT
KIT MICROPUNCTURE NIT STIFF (SHEATH) ×1 IMPLANT
PACK CARDIAC CATHETERIZATION (CUSTOM PROCEDURE TRAY) ×2 IMPLANT
SHEATH PINNACLE 5F 10CM (SHEATH) ×1 IMPLANT
SHEATH PROBE COVER 6X72 (BAG) ×1 IMPLANT
TRANSDUCER W/STOPCOCK (MISCELLANEOUS) ×2 IMPLANT
TUBING CIL FLEX 10 FLL-RA (TUBING) ×2 IMPLANT
WIRE EMERALD 3MM-J .035X150CM (WIRE) ×1 IMPLANT

## 2019-06-05 NOTE — Progress Notes (Signed)
Gave patient and [name] discharge instructions. Both verbalized understanding.

## 2019-06-05 NOTE — Interval H&P Note (Signed)
History and Physical Interval Note:  06/05/2019 11:46 AM  Wendy [name]  has presented today for surgery, with the diagnosis of unstable angina.  The various methods of treatment have been discussed with the patient and family. After consideration of risks, benefits and other options for treatment, the patient has consented to  Procedure(s): LEFT HEART CATH AND CORS/GRAFTS ANGIOGRAPHY (N/A) as a surgical intervention.  The patient's history has been reviewed, patient examined, no change in status, stable for surgery.  I have reviewed the patient's chart and labs.  Questions were answered to the patient's satisfaction.    Cath Lab Visit (complete for each Cath Lab visit)  Clinical Evaluation Leading to the Procedure:   ACS: No.  Non-ACS:    Anginal Classification: CCS III  Anti-ischemic medical therapy: Maximal Therapy (2 or more classes of medications)  Non-Invasive Test Results: No non-invasive testing performed  Prior CABG: Previous CABG        Wendy [name]

## 2019-06-05 NOTE — Discharge Instructions (Signed)
Restart Eliquis tomorrow (06/06/19) if no bleeding from left groin.   Femoral Site Care This sheet gives you information about how to care for yourself after your procedure. Your health care provider may also give you more specific instructions. If you have problems or questions, contact your health care provider. What can I expect after the procedure? After the procedure, it is common to have:  Bruising that usually fades within 1-2 weeks.  Tenderness at the site. Follow these instructions at home: Wound care  Follow instructions from your health care provider about how to take care of your insertion site. Make sure you: ? Wash your hands with soap and water before you change your bandage (dressing). If soap and water are not available, use hand sanitizer. ? Change your dressing as told by your health care provider. ? Leave stitches (sutures), skin glue, or adhesive strips in place. These skin closures may need to stay in place for 2 weeks or longer. If adhesive strip edges start to loosen and curl up, you may trim the loose edges. Do not remove adhesive strips completely unless your health care provider tells you to do that.  Do not take baths, swim, or use a hot tub until your health care provider approves.  You may shower 24-48 hours after the procedure or as told by your health care provider. ? Gently wash the site with plain soap and water. ? Pat the area dry with a clean towel. ? Do not rub the site. This may cause bleeding.  Do not apply powder or lotion to the site. Keep the site clean and dry.  Check your femoral site every day for signs of infection. Check for: ? Redness, swelling, or pain. ? Fluid or blood. ? Warmth. ? Pus or a bad smell. Activity  For the first 2-3 days after your procedure, or as long as directed: ? Avoid climbing stairs as much as possible. ? Do not squat.  Do not lift anything that is heavier than 10 lb (4.5 kg), or the limit that you are told,  until your health care provider says that it is safe.  Rest as directed. ? Avoid sitting for a long time without moving. Get up to take short walks every 1-2 hours.  Do not drive for 24 hours if you were given a medicine to help you relax (sedative). General instructions  Take over-the-counter and prescription medicines only as told by your health care provider.  Keep all follow-up visits as told by your health care provider. This is important. Contact a health care provider if you have:  A fever or chills.  You have redness, swelling, or pain around your insertion site. Get help right away if:  The catheter insertion area swells very fast.  You pass out.  You suddenly start to sweat or your skin gets clammy.  The catheter insertion area is bleeding, and the bleeding does not stop when you hold steady pressure on the area.  The area near or just beyond the catheter insertion site becomes pale, cool, tingly, or numb. These symptoms may represent a serious problem that is an emergency. Do not wait to see if the symptoms will go away. Get medical help right away. Call your local emergency services (911 in the [place] Do not drive yourself to the hospital. Summary  After the procedure, it is common to have bruising that usually fades within 1-2 weeks.  Check your femoral site every day for signs of infection.  Do not  lift anything that is heavier than 10 lb (4.5 kg), or the limit that you are told, until your health care provider says that it is safe. This information is not intended to replace advice given to you by your health care provider. Make sure you discuss any questions you have with your health care provider. Document Revised: 03/06/2017 Document Reviewed: 03/06/2017 Elsevier Patient Education  2020 [employer].

## 2019-06-05 NOTE — Progress Notes (Signed)
Site area:  Left groin fa sheath Site Prior to Removal:  Level 0 Pressure Applied For: 20 minutes Manual:   yes Patient Status During Pull:  stable Post Pull Site:  Level  0  Post Pull Instructions Given:  yes Post Pull Pulses Present: left dp palpable Dressing Applied:  Gauze and tegaderm Bedrest begins @ 1325 Comments:

## 2019-06-10 ENCOUNTER — Telehealth: Payer: Self-pay | Admitting: Cardiovascular Disease

## 2019-06-10 NOTE — Telephone Encounter (Signed)
Correction to note Pt is on Plavix and Eliquis.  She may hold the plavix for 5-7 days prior to surgery  And may hold Eliquis for 2 days prior to surgery .   Restart both when OK from a surgical standpoint

## 2019-06-10 NOTE — Telephone Encounter (Signed)
Phone call from Servando Snare, MD about Wendy [name]. 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 We will call her and stop the Plavix today . She should restart her Plavix as soon as OK with Dr. Donzetta Matters   She is at moderate - high risk for this surgery given her known CAD and CHF but is optimally tuned up.   She is at risk for needing amputation if she does not have revascularization.     [name], MD  06/10/2019 9:50 AM    [address],  [place], [place]  [address] Phone: [phone]; Fax: [phone]

## 2019-06-10 NOTE — Telephone Encounter (Signed)
Spoke with patient and advised her to hole Plavix (Clopidogrel) after today's dose and to hold Eliquis 2 days prior to surgery (April 11 & 12). She is aware to seek advised from Dr. Donzetta Matters on restarting the medications. She verbalized understanding and agreement with plan and thanked me for the call.

## 2019-06-14 ENCOUNTER — Encounter (HOSPITAL_COMMUNITY): Payer: Self-pay

## 2019-06-14 ENCOUNTER — Other Ambulatory Visit: Payer: Self-pay

## 2019-06-14 ENCOUNTER — Other Ambulatory Visit (HOSPITAL_COMMUNITY)
Admission: RE | Admit: 2019-06-14 | Discharge: 2019-06-14 | Disposition: A | Discharge status: Home / Self Care | Payer: Medicare HMO | Source: Ambulatory Visit | Attending: Vascular Surgery | Admitting: Vascular Surgery

## 2019-06-14 ENCOUNTER — Encounter (HOSPITAL_COMMUNITY)
Admission: RE | Admit: 2019-06-14 | Discharge: 2019-06-14 | Disposition: A | Discharge status: Home / Self Care | Payer: Medicare HMO | Source: Ambulatory Visit | Attending: Vascular Surgery | Admitting: Vascular Surgery

## 2019-06-14 DIAGNOSIS — Z79899 Other long term (current) drug therapy: Secondary | ICD-10-CM | POA: Diagnosis not present

## 2019-06-14 DIAGNOSIS — G629 Polyneuropathy, unspecified: Secondary | ICD-10-CM | POA: Insufficient documentation

## 2019-06-14 DIAGNOSIS — G4733 Obstructive sleep apnea (adult) (pediatric): Secondary | ICD-10-CM | POA: Insufficient documentation

## 2019-06-14 DIAGNOSIS — I252 Old myocardial infarction: Secondary | ICD-10-CM | POA: Insufficient documentation

## 2019-06-14 DIAGNOSIS — Z01818 Encounter for other preprocedural examination: Secondary | ICD-10-CM | POA: Diagnosis not present

## 2019-06-14 DIAGNOSIS — E785 Hyperlipidemia, unspecified: Secondary | ICD-10-CM | POA: Insufficient documentation

## 2019-06-14 DIAGNOSIS — K219 Gastro-esophageal reflux disease without esophagitis: Secondary | ICD-10-CM | POA: Insufficient documentation

## 2019-06-14 DIAGNOSIS — I739 Peripheral vascular disease, unspecified: Secondary | ICD-10-CM | POA: Insufficient documentation

## 2019-06-14 DIAGNOSIS — Z20822 Contact with and (suspected) exposure to covid-19: Secondary | ICD-10-CM | POA: Diagnosis not present

## 2019-06-14 DIAGNOSIS — I2510 Atherosclerotic heart disease of native coronary artery without angina pectoris: Secondary | ICD-10-CM | POA: Diagnosis not present

## 2019-06-14 DIAGNOSIS — Z96641 Presence of right artificial hip joint: Secondary | ICD-10-CM | POA: Insufficient documentation

## 2019-06-14 DIAGNOSIS — Z87891 Personal history of nicotine dependence: Secondary | ICD-10-CM | POA: Insufficient documentation

## 2019-06-14 DIAGNOSIS — Z7902 Long term (current) use of antithrombotics/antiplatelets: Secondary | ICD-10-CM | POA: Diagnosis not present

## 2019-06-14 DIAGNOSIS — M1990 Unspecified osteoarthritis, unspecified site: Secondary | ICD-10-CM | POA: Diagnosis not present

## 2019-06-14 DIAGNOSIS — J449 Chronic obstructive pulmonary disease, unspecified: Secondary | ICD-10-CM | POA: Insufficient documentation

## 2019-06-14 DIAGNOSIS — I10 Essential (primary) hypertension: Secondary | ICD-10-CM | POA: Insufficient documentation

## 2019-06-14 DIAGNOSIS — Z86718 Personal history of other venous thrombosis and embolism: Secondary | ICD-10-CM | POA: Insufficient documentation

## 2019-06-14 DIAGNOSIS — F329 Major depressive disorder, single episode, unspecified: Secondary | ICD-10-CM | POA: Insufficient documentation

## 2019-06-14 DIAGNOSIS — Z951 Presence of aortocoronary bypass graft: Secondary | ICD-10-CM | POA: Insufficient documentation

## 2019-06-14 DIAGNOSIS — I25810 Atherosclerosis of coronary artery bypass graft(s) without angina pectoris: Secondary | ICD-10-CM | POA: Diagnosis not present

## 2019-06-14 DIAGNOSIS — N390 Urinary tract infection, site not specified: Secondary | ICD-10-CM

## 2019-06-14 DIAGNOSIS — Z955 Presence of coronary angioplasty implant and graft: Secondary | ICD-10-CM | POA: Diagnosis not present

## 2019-06-14 DIAGNOSIS — I480 Paroxysmal atrial fibrillation: Secondary | ICD-10-CM | POA: Insufficient documentation

## 2019-06-14 DIAGNOSIS — F419 Anxiety disorder, unspecified: Secondary | ICD-10-CM | POA: Diagnosis not present

## 2019-06-14 DIAGNOSIS — Z7901 Long term (current) use of anticoagulants: Secondary | ICD-10-CM | POA: Diagnosis not present

## 2019-06-14 HISTORY — DX: Unspecified asthma, uncomplicated: J45.909

## 2019-06-14 HISTORY — DX: Pneumonia, unspecified organism: J18.9

## 2019-06-14 LAB — CBC
HCT: 37.6 % (ref 36.0–46.0)
Hemoglobin: 11.3 g/dL — ABNORMAL LOW (ref 12.0–15.0)
MCH: 27.9 pg (ref 26.0–34.0)
MCHC: 30.1 g/dL (ref 30.0–36.0)
MCV: 92.8 fL (ref 80.0–100.0)
Platelets: 237 10*3/uL (ref 150–400)
RBC: 4.05 MIL/uL (ref 3.87–5.11)
RDW: 16.3 % — ABNORMAL HIGH (ref 11.5–15.5)
WBC: 5.6 10*3/uL (ref 4.0–10.5)
nRBC: 0 % (ref 0.0–0.2)

## 2019-06-14 LAB — APTT: aPTT: 37 seconds — ABNORMAL HIGH (ref 24–36)

## 2019-06-14 LAB — SURGICAL PCR SCREEN
MRSA, PCR: POSITIVE — AB
Staphylococcus aureus: POSITIVE — AB

## 2019-06-14 LAB — COMPREHENSIVE METABOLIC PANEL
ALT: 10 U/L (ref 0–44)
AST: 13 U/L — ABNORMAL LOW (ref 15–41)
Albumin: 3.2 g/dL — ABNORMAL LOW (ref 3.5–5.0)
Alkaline Phosphatase: 62 U/L (ref 38–126)
Anion gap: 9 (ref 5–15)
BUN: 22 mg/dL (ref 8–23)
CO2: 26 mmol/L (ref 22–32)
Calcium: 9.3 mg/dL (ref 8.9–10.3)
Chloride: 105 mmol/L (ref 98–111)
Creatinine, Ser: 1.17 mg/dL — ABNORMAL HIGH (ref 0.44–1.00)
GFR calc Af Amer: 56 mL/min — ABNORMAL LOW (ref >60)
GFR calc non Af Amer: 48 mL/min — ABNORMAL LOW (ref >60)
Glucose, Bld: 92 mg/dL (ref 70–99)
Potassium: 4.8 mmol/L (ref 3.5–5.1)
Sodium: 140 mmol/L (ref 135–145)
Total Bilirubin: 0.6 mg/dL (ref 0.3–1.2)
Total Protein: 6.6 g/dL (ref 6.5–8.1)

## 2019-06-14 LAB — PROTIME-INR
INR: 1.6 — ABNORMAL HIGH (ref 0.8–1.2)
Prothrombin Time: 18.5 seconds — ABNORMAL HIGH (ref 11.4–15.2)

## 2019-06-14 LAB — URINALYSIS, MICROSCOPIC (REFLEX)

## 2019-06-14 LAB — URINALYSIS, ROUTINE W REFLEX MICROSCOPIC
Bilirubin Urine: NEGATIVE
Glucose, UA: NEGATIVE mg/dL
Hgb urine dipstick: NEGATIVE
Ketones, ur: NEGATIVE mg/dL
Nitrite: POSITIVE — AB
Protein, ur: NEGATIVE mg/dL
Specific Gravity, Urine: 1.025 (ref 1.005–1.030)
pH: 5 (ref 5.0–8.0)

## 2019-06-14 MED ORDER — CIPROFLOXACIN HCL 500 MG PO TABS: 500.0000 mg | ORAL_TABLET | Freq: Two times a day (BID) | ORAL | 0 refills | Status: DC

## 2019-06-14 NOTE — Pre-Procedure Instructions (Signed)
[facility] - [place], [place] - [address] [place] [address] Phone: [phone] Fax: [phone]     Your procedure is scheduled on Tuesday, April 13th, from 07:30 AM to 12:16 AM.  Report to [facility]tr[facility] "[facility]" at 05:30 A.M., and check in at the Admitting office.  Call this number if you have problems the morning of surgery:  [phone]  Call [phone] if you have any questions prior to your surgery date Monday-Friday 8am-4pm.    Remember:  Do not eat or drink after midnight the night before your surgery.     Take these medicines the morning of surgery with A SIP OF WATER :     atorvastatin (LIPITOR) buPROPion (WELLBUTRIN XL) ezetimibe (ZETIA) gabapentin (NEURONTIN) isosorbide mononitrate (IMDUR) metoprolol tartrate (LOPRESSOR) pantoprazole (PROTONIX) ranolazine (RANEXA)   IF NEEDED: acetaminophen (TYLENOL)  ALPRAZolam (XANAX) Oxycodone HCl  nitroGLYCERIN (NITROSTAT)  albuterol (PROVENTIL HFA;VENTOLIN HFA) inhaler  **Follow your surgeon's instructions on when to stop apixaban (ELIQUIS) and clopidogrel (PLAVIX).  If no instructions were given by your surgeon then you will need to call the office to get those instructions.**   As of today, STOP taking any Aspirin (unless otherwise instructed by your surgeon) and Aspirin containing products, NSAIDs such as diclofenac Sodium (VOLTAREN) GEL,  Aleve, Naproxen, Ibuprofen, Motrin, Advil, Goody's, BC's, all herbal medications, fish oil, and all vitamins.                      Do not wear jewelry, make up, or nail polish.            Do not wear lotions, powders, perfumes, or deodorant.            Do not shave 48 hours prior to surgery.              Do not bring valuables to the hospital.            [facility] is not responsible for any belongings or valuables.  Do NOT Smoke (Tobacco/Vapping) or drink Alcohol 24 hours prior to your procedure.  If  you use a CPAP at night, you may bring all equipment for your overnight stay.   Contacts, glasses, dentures or bridgework may not be worn into surgery.      For patients admitted to the hospital, discharge time will be determined by your treatment team.   Patients discharged the day of surgery will not be allowed to drive home, and someone needs to stay with them for 24 hours.    Special instructions:   Wendy [name]- Preparing For Surgery  Before surgery, you can play an important role. Because skin is not sterile, your skin needs to be as free of germs as possible. You can reduce the number of germs on your skin by washing with CHG (chlorahexidine gluconate) Soap before surgery.  CHG is an antiseptic cleaner which kills germs and bonds with the skin to continue killing germs even after washing.    Oral Hygiene is also important to reduce your risk of infection.  Remember - BRUSH YOUR TEETH THE MORNING OF SURGERY WITH YOUR REGULAR TOOTHPASTE  Please do not use if you have an allergy to CHG or antibacterial soaps. If your skin becomes reddened/irritated stop using the CHG.  Do not shave (including legs and underarms) for at least 48 hours prior to first CHG shower. It is OK to shave your face.  Please follow  these instructions carefully.   1. Shower the NIGHT BEFORE SURGERY and the MORNING OF SURGERY with CHG Soap.   2. If you chose to wash your hair, wash your hair first as usual with your normal shampoo.  3. After you shampoo, rinse your hair and body thoroughly to remove the shampoo.  4. Use CHG as you would any other liquid soap. You can apply CHG directly to the skin and wash gently with a scrungie or a clean washcloth.   5. Apply the CHG Soap to your body ONLY FROM THE NECK DOWN.  Do not use on open wounds or open sores. Avoid contact with your eyes, ears, mouth and genitals (private parts). Wash Face and genitals (private parts)  with your normal soap.   6. Wash thoroughly,  paying special attention to the area where your surgery will be performed.  7. Thoroughly rinse your body with warm water from the neck down.  8. DO NOT shower/wash with your normal soap after using and rinsing off the CHG Soap.  9. Pat yourself dry with a CLEAN TOWEL.  10. Wear CLEAN PAJAMAS to bed the night before surgery, wear comfortable clothes the morning of surgery  11. Place CLEAN SHEETS on your bed the night of your first shower and DO NOT SLEEP WITH PETS.   Day of Surgery:   Do not apply any deodorants/lotions.  Please wear clean clothes to the hospital/surgery center.   Remember to brush your teeth WITH YOUR REGULAR TOOTHPASTE.   Please read over the following fact sheets that you were given.

## 2019-06-14 NOTE — Progress Notes (Signed)
Patient Hypotensive during PAT visit:  Patient SBP in 80s, denies dizziness, lightheadedness, SHOB, headache, CP. Patient reports 8/10 chronic lower back pain. Per patient, her SBP can get in 80s if  "I don't take my pain meds". Patient's last reported full meal was last evening. She said she had a marshmallow covered chocolate egg ~ 12 noon today. 10 oz water offered to patient. Myra Gianotti, PA-C notified. BP rechecked. Patient in no acute distress, labs obtained.     06/14/19 1302 06/14/19 1325 06/14/19 1400  Vitals  Temp 98.2 F (36.8 C)  --   --   Pulse Rate (!) 56  --   --   Resp 17  --   --   BP (!) 81/47  --  (!) 93/58  SpO2 96 %  --   --   LOC  Level of Consciousness  --  Alert  --   Orientation Level  --  Oriented X4  --

## 2019-06-14 NOTE — Progress Notes (Addendum)
Anesthesia PAT Evaluation:   Case: Wendy [name] Date/Time: 06/18/19 0715   Procedure: AORTA BIFEMORAL BYPASS GRAFT (N/A )   Anesthesia type: General   Pre-op diagnosis: PERIPHERAL ARTERY DISEASE   Location: [facility] ROOM 12 / [facility]   Surgeons: Waynetta Sandy, MD      DISCUSSION:  Patient is a 69 year old female scheduled for the above procedure.    History includes former smoker (quit '00), COPD, CAD (MI; s/p CABG: LIMA-LAD, SVG-OM, SVG-RCA'98; s/p SVG-OM stent and SVG-RCA stent '13 in [place]; atretic LIMA-LAD with patent LAD 07/19/13; occluded SVG-OM and LIMA-LAD with patent LAD, s/p DES x2 to SVG-RCA 01/22/15 occluded on 07/25/16 LHC; stable 3V CAD with 3/3 occluded grafts with RCA filling from left-to-right collaterals 05/2019, medical therapy), stable angina (on Ranexa, Imdur, nitro, metoprolol, Plavix, Eliquis), PAF (09/2013 post-op, 01/2015,08/2015),HTN, PAD (s/p FFBG 1999, failed), peripheral neuropathy,impaired glucose tolerance,HLD, exertional dyspnea, GERD, OSA (CPAP), anemia, right THA (07/19/16, s/p revision 08/22/17).   She had an uninterpretable nuclear stress test last month so was referred for LHC which was done on 06/05/19 and showed stable 3V CAD with 3/3 occluded grafts with RCA filling from left-to-right collaterals. Continued medical therapy and proceed with leg bypass surgery per cardiology. Dr. [name] hodling Plavix for 5-7 days and Eliquis 2 days prior to surgery and resume both when okay from a surgical standpoint.  She is scheduled for an echo on 06/17/19 at 12:45 PM.   I evaluated patient during her PAT visit due to asymptomatic hypotension. Initial BPs on arrival were 84/44 and 81/47. She had taken CV/HTN medications. She had not really eaten since the day before except for a marshmellow chocolate Easter egg, so I asked staff to have her drink some water. She monitors her BP every morning and says they have been "normal". She denied SOB, dizziness, syncope,  palpitations, current chest pain. She does have chronic DOE which she feels is stable. She said she felt in her usual state of health. She was A&O, answering questions appropriately. As above, she has known history of chronic angina on antianginal therapy and recent cath with medical therapy recommended. She does uses Nitro periodically and has so for the last several years (reports sometimes only one every few months and some weeks she may need one for several days in a row), last about two days prior. She has not noticed any recent change or progression of her symptoms. BP while sitting rechecked at end of RN interview and was 93/58 with HR 51.   06/14/19 COVID-19 test is in process. She is for echo on 06/17/19. Chart will be left for follow-up. (UPDATE 06/17/19 5:43 PM: COVID test negative. 06/17/19 echo report is not yet available.)   VS: BP (!) 93/58   Pulse (!) 56   Temp 36.8 C   Resp 17   Ht 5\' 1"  (1.549 m)   Wt 65.5 kg   SpO2 96%   BMI 27.28 kg/m  Heart RRR, no murmur. Lungs clear. No pitting edema.    PROVIDERS: Josetta Huddle, MD is PCP [name], MD is cardiologist   LABS: Preoperative labs noted. UA + for leukocytes and nitrites, antibiotics prescribed per VVS.  (all labs ordered are listed, but only abnormal results are displayed)  Labs Reviewed  SURGICAL PCR SCREEN - Abnormal; Notable for the following components:      Result Value   MRSA, PCR POSITIVE (*)    Staphylococcus aureus POSITIVE (*)    All other components within  normal limits  APTT - Abnormal; Notable for the following components:   aPTT 37 (*)    All other components within normal limits  CBC - Abnormal; Notable for the following components:   Hemoglobin 11.3 (*)    RDW 16.3 (*)    All other components within normal limits  COMPREHENSIVE METABOLIC PANEL - Abnormal; Notable for the following components:   Creatinine, Ser 1.17 (*)    Albumin 3.2 (*)    AST 13 (*)    GFR calc non Af Amer 48 (*)    GFR  calc Af Amer 56 (*)    All other components within normal limits  PROTIME-INR - Abnormal; Notable for the following components:   Prothrombin Time 18.5 (*)    INR 1.6 (*)    All other components within normal limits  URINALYSIS, ROUTINE W REFLEX MICROSCOPIC - Abnormal; Notable for the following components:   APPearance CLOUDY (*)    Nitrite POSITIVE (*)    Leukocytes,Ua TRACE (*)    All other components within normal limits  URINALYSIS, MICROSCOPIC (REFLEX) - Abnormal; Notable for the following components:   Bacteria, UA MANY (*)    All other components within normal limits  TYPE AND SCREEN  Last A1c seen was on 4.9 on 08/15/16.   IMAGES: 1V PCXR 09/13/18: FINDINGS: The patient is status post prior CABG. The heart is enlarged. There is mild elevation of the right hemidiaphragm which is similar to prior studies. There is no pneumothorax. There is mild vascular congestion. There is no acute osseous abnormality. The patient is status post prior ORIF of the proximal left humerus. IMPRESSION: No active disease.   EKG: 05/16/19: NSR at 61 bpm   CV: Cardiac cath 06/05/19:  Prox RCA to Mid RCA lesion is 100% stenosed.  Dist Cx lesion is 80% stenosed.  3rd Mrg lesion is 100% stenosed.  1st Mrg-1 lesion is 100% stenosed.  1st Mrg-2 lesion is 100% stenosed.  SVG graft was not visualized.  Origin to Prox Graft lesion is 100% stenosed.  SVG graft was not visualized.  Origin to Prox Graft lesion is 100% stenosed.  Prox LAD to Mid LAD lesion is 60% stenosed.  1st Diag lesion is 100% stenosed.  LIMA graft was not visualized.  Origin to Dist Graft lesion is 100% stenosed. 1. Stable three vessel CAD s/p 3V CABG with known occlusion of all three grafts.  2. Moderate, calcified mid LAD stenosis that is angiographically improved from cath in 2018.  3. Chronic occlusion proximal RCA. The vessel fills from left to right collaterals.  4. Chronic occlusion obtuse marginal and distal  Circumflex.  Recommendations: Continue medical management of CAD. Proceed with planning for LE bypass surgery.    Nuclear stress test 05/22/19:  Nuclear stress EF: 60%.  There was no ST segment deviation noted during stress.  No T wave inversion was noted during stress. Impression: 1. The is a completely un-interpretable myocardial perfusion imaging study due to significant extracardiac activity. There was a profound amount of gut uptake that rendered this study not interpretable.  2. Would recommend to re-scan or pursue CCTA if able.    Aortogram with BLE runoff 04/29/19: Findings: Aorta is patent although distally it appears diminutive.  Left common iliac and external leg arteries are patent.  Left-sided common femoral artery appears to have disease stenosis likely greater than 50%.  She has inline flow via the SFA popliteal and appears to be anterior tibial dominant on the left.  On the  right side common femoral artery is occluded.  She reconstitutes a profunda on the right.  She then appears to reconstitute above-the-knee popliteal and has runoff via the anterior tibial artery on the right.  On delayed imaging it appears that the SFA comes back to near the common femoral artery. Patient will be best served with aortobifemoral bypass with possible right femoral to popliteal artery bypass unless we can get good backbleeding from the SFA and profunda.  If it is thought that her heart did not tolerate this we could also consider redo femorofemoral bypass with femoropopliteal bypass.   Echo 07/23/16: Study Conclusions - Left ventricle: The cavity size was normal. Wall thickness was normal. Systolic function was normal. The estimated ejection fraction was in the range of 60% to 65%. Wall motion was normal; there were no regional wall motion abnormalities. The study is not technically sufficient to allow evaluation of LV diastolic function. - Aortic valve: Mildly calcified annulus.  Trileaflet. - Mitral valve: Calcified annulus. There was trivial regurgitation. - Right atrium: Central venous pressure (est): 3 mm Hg. - Tricuspid valve: There was trivial regurgitation. - Pulmonary arteries: PA peak pressure: 24 mm Hg (S). - Pericardium, extracardiac: There was no pericardial effusion. Impressions: - Normal LV wall thickness with LVEF 60-65%. Indeterminate diastolic function. Mitral annular calcification with trivial mitral regurgitation. Trivial tricuspid regurgitation with PASP 24 mmHg.   Past Medical History:  Diagnosis Date  . Anemia   . Anginal pain ([place])    jaw pain 07/2016, s/p 07/15/16 nuclear stress test  . Anxiety   . Arthritis    "lower back" (08/24/2017)  . CAD (coronary artery disease), native coronary artery    a. Hx CABG 1998, last PCI 2013 b.LHC 07/2013:  EF 55-60%, L-LAD prob atretic, no sig disease in LAD, S-OM1 ok with patent stent, S-dRCA ok => med Rx. c. LHC 01/2015 DES to SVG-RCA.  Marland Kitchen Chronic back pain   . Chronic leg pain   . Chronic lower back pain   . COPD (chronic obstructive pulmonary disease) ([place])   . Depression   . DVT (deep venous thrombosis) ([facility]) X 3   RLE  . Fall 08/2016  . GERD (gastroesophageal reflux disease)   . Glucose intolerance (impaired glucose tolerance)   . Headache    "a few/month" (08/24/2017)  . HLD (hyperlipidemia)   . Hypertension   . Myocardial infarction ([place]) 1999  . OSA on CPAP   . PAD (peripheral artery disease) ([place])   . PAF (paroxysmal atrial fibrillation) ([place])    a. 09/2013 post-op b. 01/2015  . Peripheral neuropathy   . Shortness of breath    when walking  . Wears glasses     Past Surgical History:  Procedure Laterality Date  . ABDOMINAL AORTOGRAM W/LOWER EXTREMITY Bilateral 04/29/2019   Procedure: ABDOMINAL AORTOGRAM W/LOWER EXTREMITY;  Surgeon: Waynetta Sandy, MD;  Location: [facility];  Service: Cardiovascular;  Laterality: Bilateral;  . ANTERIOR CERVICAL  DECOMP/DISCECTOMY FUSION  1991  . BACK SURGERY    . CARDIAC CATHETERIZATION N/A 01/22/2015   Procedure: Left Heart Cath and Coronary Angiography;  Surgeon: Troy Sine, MD;  Location: [facility];  Service: Cardiovascular;  Laterality: N/A;  . CARDIAC CATHETERIZATION N/A 01/22/2015   Procedure: Coronary Stent Intervention;  Surgeon: Troy Sine, MD;  Location: [facility];  Service: Cardiovascular;  Laterality: N/A;  3.0x12 xience prox SVG to RCA  . CARDIAC CATHETERIZATION  03/09/2000   hx/notes 03/20/2000  .  CHOLECYSTECTOMY N/A 09/16/2013   Procedure: LAPAROSCOPIC CHOLECYSTECTOMY CONVERTED TO OPEN CHOLECYSTECTOMY WITH CHOLANGIOGRAM;  Surgeon: [name], MD;  Location: [facility];  Service: General;  Laterality: N/A;  . CORONARY ANGIOPLASTY WITH STENT PLACEMENT  11/2011, 02/2012, 01/2015   DES to SVG-RCA both times, In [place], [place], Dr. Bruce Donath  . CORONARY ARTERY BYPASS GRAFT  1998   LIMA-LAD, SVG-OM, SVG-RCA  . ERCP N/A 09/19/2013   Procedure: ENDOSCOPIC RETROGRADE CHOLANGIOPANCREATOGRAPHY (ERCP);  Surgeon: [name], MD;  Location: [facility];  Service: Endoscopy;  Laterality: N/A;  . FINGER SURGERY Right    ring finger "fused"  . FRACTURE SURGERY    . IRRIGATION AND DEBRIDEMENT ABSCESS Right 10/17/2013   Procedure: INCISION AND DRAINAGE RIGHT SUBCOSTAL WOUND;  Surgeon: [name], MD;  Location: [facility];  Service: General;  Laterality: Right;  . JOINT REPLACEMENT    . KNEE ARTHROSCOPY Right 1998  . LEFT HEART CATH AND CORS/GRAFTS ANGIOGRAPHY N/A 07/25/2016   Procedure: Left Heart Cath and Cors/Grafts Angiography;  Surgeon: Jettie Booze, MD;  Location: [facility];  Service: Cardiovascular;  Laterality: N/A;  . LEFT HEART CATH AND CORS/GRAFTS ANGIOGRAPHY N/A 06/05/2019   Procedure: LEFT HEART CATH AND CORS/GRAFTS ANGIOGRAPHY;  Surgeon: Burnell Blanks, MD;  Location: [facility];  Service: Cardiovascular;  Laterality: N/A;  .  LEFT HEART CATHETERIZATION WITH CORONARY/GRAFT ANGIOGRAM N/A 07/19/2013   Procedure: LEFT HEART CATHETERIZATION WITH Beatrix Fetters;  Surgeon: Leonie Man, MD;  Location: [facility];  Service: Cardiovascular;  Laterality: N/A;  . MASS EXCISION Right 06/25/2014   Procedure: EXCISION BUTTOCK MASS ;  Surgeon: Coralie Keens, MD;  Location: [facility];  Service: General;  Laterality: Right;  . ORIF HUMERUS FRACTURE Left 08/27/2013   Procedure: OPEN REDUCTION INTERNAL FIXATION (ORIF) LEFT HUMERUS ;  Surgeon: Rozanna Box, MD;  Location: [facility];  Service: Orthopedics;  Laterality: Left;  . POSTERIOR FUSION CERVICAL SPINE  1992  . THROMBECTOMY / EMBOLECTOMY FEMORAL ARTERY Right    hx/notes 03/20/2000  . TOTAL HIP ARTHROPLASTY Right 07/19/2016   Procedure: TOTAL HIP ARTHROPLASTY ANTERIOR APPROACH;  Surgeon: [name], MD;  Location: [facility];  Service: Orthopedics;  Laterality: Right;  . TOTAL HIP REVISION Right 08/22/2017   Procedure: TOTAL HIP REVISION;  Surgeon: [name], MD;  Location: [facility];  Service: Orthopedics;  Laterality: Right;    MEDICATIONS: . acetaminophen (TYLENOL) 500 MG tablet  . albuterol (PROVENTIL HFA;VENTOLIN HFA) 108 (90 Base) MCG/ACT inhaler  . ALPRAZolam (XANAX) 0.5 MG tablet  . apixaban (ELIQUIS) 5 MG TABS tablet  . atorvastatin (LIPITOR) 80 MG tablet  . buPROPion (WELLBUTRIN XL) 150 MG 24 hr tablet  . ciprofloxacin (CIPRO) 500 MG tablet  . clopidogrel (PLAVIX) 75 MG tablet  . diclofenac Sodium (VOLTAREN) 1 % GEL  . ezetimibe (ZETIA) 10 MG tablet  . furosemide (LASIX) 20 MG tablet  . gabapentin (NEURONTIN) 300 MG capsule  . isosorbide mononitrate (IMDUR) 60 MG 24 hr tablet  . metoprolol tartrate (LOPRESSOR) 50 MG tablet  . nitroGLYCERIN (NITROSTAT) 0.4 MG SL tablet  . Oxycodone HCl 10 MG TABS  . pantoprazole (PROTONIX) 40 MG tablet  . pramipexole (MIRAPEX) 0.25 MG tablet  . ranolazine (RANEXA) 1000 MG SR tablet  .  sertraline (ZOLOFT) 100 MG tablet  . valsartan-hydrochlorothiazide (DIOVAN-HCT) 160-12.5 MG tablet   . ferrous sulfate tablet 325 mg    Myra Gianotti, PA-C Surgical Short Stay/Anesthesiology [facility] Phone [phone] [facility]  Phone [phone] 06/14/2019 5:21 PM

## 2019-06-14 NOTE — Progress Notes (Signed)
PCP - Josetta Huddle, MD Cardiologist - [name], MD  PPM/ICD - Denies  Chest x-ray - 09/13/18 EKG - 05/16/19 Stress Test - 05/22/19 ECHO - 07/23/16; Next scheduled 06/17/19 Cardiac Cath - 06/05/19  Sleep Study - Yes CPAP - Yes, qHS  Blood Thinner Instructions: Per patient, last dose Plavix 06/10/19, last dose Eliquis 06/15/19 Aspirin Instructions: N/A  ERAS Protcol - N/A PRE-SURGERY Ensure or G2- N/A  COVID TEST- 06/14/19   Anesthesia review: Yes, cardiac hx.  Patient denies shortness of breath, fever, cough and chest pain at PAT appointment   All instructions explained to the patient, with a verbal understanding of the material. Patient agrees to go over the instructions while at home for a better understanding. Patient also instructed to self quarantine after being tested for COVID-19. The opportunity to ask questions was provided.

## 2019-06-14 NOTE — Progress Notes (Signed)
Preadmit testing called to state that patients urine from surgery testing shows that she has a UTI with positive for leukocytes and nitrates. Abx sent to pharmacy.   Rain Friedt Mindi Junker

## 2019-06-14 NOTE — Progress Notes (Addendum)
I called Urine results to Olene Floss at Dr. Oneida Alar' office.

## 2019-06-15 LAB — SARS CORONAVIRUS 2 (TAT 6-24 HRS): SARS Coronavirus 2: NEGATIVE

## 2019-06-17 ENCOUNTER — Other Ambulatory Visit: Payer: Self-pay

## 2019-06-17 ENCOUNTER — Ambulatory Visit (HOSPITAL_BASED_OUTPATIENT_CLINIC_OR_DEPARTMENT_OTHER): Payer: Medicare HMO

## 2019-06-17 ENCOUNTER — Encounter (HOSPITAL_COMMUNITY): Payer: Self-pay | Admitting: Vascular Surgery

## 2019-06-17 DIAGNOSIS — T82392A Other mechanical complication of femoral arterial graft (bypass), initial encounter: Secondary | ICD-10-CM | POA: Diagnosis not present

## 2019-06-17 DIAGNOSIS — N289 Disorder of kidney and ureter, unspecified: Secondary | ICD-10-CM | POA: Diagnosis not present

## 2019-06-17 DIAGNOSIS — I219 Acute myocardial infarction, unspecified: Secondary | ICD-10-CM | POA: Diagnosis not present

## 2019-06-17 DIAGNOSIS — Z48812 Encounter for surgical aftercare following surgery on the circulatory system: Secondary | ICD-10-CM | POA: Diagnosis not present

## 2019-06-17 DIAGNOSIS — I25700 Atherosclerosis of coronary artery bypass graft(s), unspecified, with unstable angina pectoris: Secondary | ICD-10-CM | POA: Diagnosis not present

## 2019-06-17 DIAGNOSIS — I5022 Chronic systolic (congestive) heart failure: Secondary | ICD-10-CM | POA: Diagnosis not present

## 2019-06-17 DIAGNOSIS — I70222 Atherosclerosis of native arteries of extremities with rest pain, left leg: Secondary | ICD-10-CM | POA: Diagnosis not present

## 2019-06-17 DIAGNOSIS — I25110 Atherosclerotic heart disease of native coronary artery with unstable angina pectoris: Secondary | ICD-10-CM | POA: Diagnosis not present

## 2019-06-17 DIAGNOSIS — Z20822 Contact with and (suspected) exposure to covid-19: Secondary | ICD-10-CM | POA: Diagnosis not present

## 2019-06-17 DIAGNOSIS — Z95828 Presence of other vascular implants and grafts: Secondary | ICD-10-CM | POA: Diagnosis not present

## 2019-06-17 DIAGNOSIS — F29 Unspecified psychosis not due to a substance or known physiological condition: Secondary | ICD-10-CM | POA: Diagnosis not present

## 2019-06-17 DIAGNOSIS — I739 Peripheral vascular disease, unspecified: Secondary | ICD-10-CM | POA: Diagnosis present

## 2019-06-17 DIAGNOSIS — J449 Chronic obstructive pulmonary disease, unspecified: Secondary | ICD-10-CM | POA: Diagnosis present

## 2019-06-17 DIAGNOSIS — Z9889 Other specified postprocedural states: Secondary | ICD-10-CM | POA: Diagnosis not present

## 2019-06-17 DIAGNOSIS — N390 Urinary tract infection, site not specified: Secondary | ICD-10-CM | POA: Diagnosis not present

## 2019-06-17 DIAGNOSIS — I721 Aneurysm of artery of upper extremity: Secondary | ICD-10-CM | POA: Diagnosis not present

## 2019-06-17 DIAGNOSIS — I4891 Unspecified atrial fibrillation: Secondary | ICD-10-CM | POA: Diagnosis not present

## 2019-06-17 DIAGNOSIS — D649 Anemia, unspecified: Secondary | ICD-10-CM | POA: Diagnosis not present

## 2019-06-17 DIAGNOSIS — I469 Cardiac arrest, cause unspecified: Secondary | ICD-10-CM | POA: Diagnosis not present

## 2019-06-17 DIAGNOSIS — R279 Unspecified lack of coordination: Secondary | ICD-10-CM | POA: Diagnosis not present

## 2019-06-17 DIAGNOSIS — I25708 Atherosclerosis of coronary artery bypass graft(s), unspecified, with other forms of angina pectoris: Secondary | ICD-10-CM

## 2019-06-17 DIAGNOSIS — R5381 Other malaise: Secondary | ICD-10-CM | POA: Diagnosis not present

## 2019-06-17 DIAGNOSIS — K219 Gastro-esophageal reflux disease without esophagitis: Secondary | ICD-10-CM | POA: Diagnosis present

## 2019-06-17 DIAGNOSIS — J9690 Respiratory failure, unspecified, unspecified whether with hypoxia or hypercapnia: Secondary | ICD-10-CM | POA: Diagnosis not present

## 2019-06-17 DIAGNOSIS — I5043 Acute on chronic combined systolic (congestive) and diastolic (congestive) heart failure: Secondary | ICD-10-CM | POA: Diagnosis not present

## 2019-06-17 DIAGNOSIS — G8929 Other chronic pain: Secondary | ICD-10-CM | POA: Diagnosis not present

## 2019-06-17 DIAGNOSIS — I5032 Chronic diastolic (congestive) heart failure: Secondary | ICD-10-CM | POA: Diagnosis not present

## 2019-06-17 DIAGNOSIS — I25118 Atherosclerotic heart disease of native coronary artery with other forms of angina pectoris: Secondary | ICD-10-CM | POA: Diagnosis not present

## 2019-06-17 DIAGNOSIS — E785 Hyperlipidemia, unspecified: Secondary | ICD-10-CM | POA: Diagnosis not present

## 2019-06-17 DIAGNOSIS — I9589 Other hypotension: Secondary | ICD-10-CM | POA: Diagnosis not present

## 2019-06-17 DIAGNOSIS — I25810 Atherosclerosis of coronary artery bypass graft(s) without angina pectoris: Secondary | ICD-10-CM | POA: Diagnosis present

## 2019-06-17 DIAGNOSIS — I959 Hypotension, unspecified: Secondary | ICD-10-CM | POA: Diagnosis not present

## 2019-06-17 DIAGNOSIS — I5042 Chronic combined systolic (congestive) and diastolic (congestive) heart failure: Secondary | ICD-10-CM | POA: Diagnosis not present

## 2019-06-17 DIAGNOSIS — K567 Ileus, unspecified: Secondary | ICD-10-CM | POA: Diagnosis not present

## 2019-06-17 DIAGNOSIS — F1290 Cannabis use, unspecified, uncomplicated: Secondary | ICD-10-CM | POA: Diagnosis present

## 2019-06-17 DIAGNOSIS — Z95811 Presence of heart assist device: Secondary | ICD-10-CM | POA: Diagnosis not present

## 2019-06-17 DIAGNOSIS — R0602 Shortness of breath: Secondary | ICD-10-CM | POA: Diagnosis not present

## 2019-06-17 DIAGNOSIS — I2510 Atherosclerotic heart disease of native coronary artery without angina pectoris: Secondary | ICD-10-CM | POA: Diagnosis present

## 2019-06-17 DIAGNOSIS — N179 Acute kidney failure, unspecified: Secondary | ICD-10-CM | POA: Diagnosis not present

## 2019-06-17 DIAGNOSIS — F419 Anxiety disorder, unspecified: Secondary | ICD-10-CM | POA: Diagnosis present

## 2019-06-17 DIAGNOSIS — M1611 Unilateral primary osteoarthritis, right hip: Secondary | ICD-10-CM | POA: Diagnosis not present

## 2019-06-17 DIAGNOSIS — I110 Hypertensive heart disease with heart failure: Secondary | ICD-10-CM | POA: Diagnosis present

## 2019-06-17 DIAGNOSIS — G629 Polyneuropathy, unspecified: Secondary | ICD-10-CM | POA: Diagnosis not present

## 2019-06-17 DIAGNOSIS — D62 Acute posthemorrhagic anemia: Secondary | ICD-10-CM | POA: Diagnosis not present

## 2019-06-17 DIAGNOSIS — I7409 Other arterial embolism and thrombosis of abdominal aorta: Secondary | ICD-10-CM | POA: Diagnosis not present

## 2019-06-17 DIAGNOSIS — R627 Adult failure to thrive: Secondary | ICD-10-CM | POA: Diagnosis not present

## 2019-06-17 DIAGNOSIS — Y840 Cardiac catheterization as the cause of abnormal reaction of the patient, or of later complication, without mention of misadventure at the time of the procedure: Secondary | ICD-10-CM | POA: Diagnosis not present

## 2019-06-17 DIAGNOSIS — R0689 Other abnormalities of breathing: Secondary | ICD-10-CM | POA: Diagnosis not present

## 2019-06-17 DIAGNOSIS — Y832 Surgical operation with anastomosis, bypass or graft as the cause of abnormal reaction of the patient, or of later complication, without mention of misadventure at the time of the procedure: Secondary | ICD-10-CM | POA: Diagnosis present

## 2019-06-17 DIAGNOSIS — Z452 Encounter for adjustment and management of vascular access device: Secondary | ICD-10-CM | POA: Diagnosis not present

## 2019-06-17 DIAGNOSIS — R001 Bradycardia, unspecified: Secondary | ICD-10-CM | POA: Diagnosis not present

## 2019-06-17 DIAGNOSIS — B9620 Unspecified Escherichia coli [E. coli] as the cause of diseases classified elsewhere: Secondary | ICD-10-CM | POA: Diagnosis not present

## 2019-06-17 DIAGNOSIS — R918 Other nonspecific abnormal finding of lung field: Secondary | ICD-10-CM | POA: Diagnosis not present

## 2019-06-17 DIAGNOSIS — R710 Precipitous drop in hematocrit: Secondary | ICD-10-CM | POA: Diagnosis not present

## 2019-06-17 DIAGNOSIS — Z743 Need for continuous supervision: Secondary | ICD-10-CM | POA: Diagnosis not present

## 2019-06-17 DIAGNOSIS — Z4682 Encounter for fitting and adjustment of non-vascular catheter: Secondary | ICD-10-CM | POA: Diagnosis not present

## 2019-06-17 DIAGNOSIS — N170 Acute kidney failure with tubular necrosis: Secondary | ICD-10-CM | POA: Diagnosis not present

## 2019-06-17 DIAGNOSIS — I9788 Other intraoperative complications of the circulatory system, not elsewhere classified: Secondary | ICD-10-CM | POA: Diagnosis not present

## 2019-06-17 DIAGNOSIS — I340 Nonrheumatic mitral (valve) insufficiency: Secondary | ICD-10-CM | POA: Diagnosis not present

## 2019-06-17 DIAGNOSIS — M79609 Pain in unspecified limb: Secondary | ICD-10-CM | POA: Diagnosis not present

## 2019-06-17 DIAGNOSIS — J9601 Acute respiratory failure with hypoxia: Secondary | ICD-10-CM | POA: Diagnosis not present

## 2019-06-17 DIAGNOSIS — K9189 Other postprocedural complications and disorders of digestive system: Secondary | ICD-10-CM | POA: Diagnosis not present

## 2019-06-17 DIAGNOSIS — E872 Acidosis: Secondary | ICD-10-CM | POA: Diagnosis not present

## 2019-06-17 DIAGNOSIS — I480 Paroxysmal atrial fibrillation: Secondary | ICD-10-CM | POA: Diagnosis not present

## 2019-06-17 DIAGNOSIS — R570 Cardiogenic shock: Secondary | ICD-10-CM | POA: Diagnosis not present

## 2019-06-17 DIAGNOSIS — I509 Heart failure, unspecified: Secondary | ICD-10-CM | POA: Diagnosis not present

## 2019-06-17 DIAGNOSIS — I10 Essential (primary) hypertension: Secondary | ICD-10-CM | POA: Diagnosis not present

## 2019-06-17 DIAGNOSIS — I462 Cardiac arrest due to underlying cardiac condition: Secondary | ICD-10-CM | POA: Diagnosis not present

## 2019-06-17 DIAGNOSIS — R000 Tachycardia, unspecified: Secondary | ICD-10-CM | POA: Diagnosis not present

## 2019-06-17 DIAGNOSIS — R079 Chest pain, unspecified: Secondary | ICD-10-CM | POA: Diagnosis not present

## 2019-06-17 DIAGNOSIS — I5041 Acute combined systolic (congestive) and diastolic (congestive) heart failure: Secondary | ICD-10-CM | POA: Diagnosis not present

## 2019-06-17 NOTE — Anesthesia Preprocedure Evaluation (Addendum)
Anesthesia Evaluation  Patient identified by MRN, date of birth, ID band Patient awake    Reviewed: Allergy & Precautions, NPO status , Patient's Chart, lab work & pertinent test results  Airway Mallampati: II  TM Distance: >3 FB Neck ROM: Full    Dental  (+) Dental Advisory Given   Pulmonary shortness of breath, asthma , sleep apnea , pneumonia, resolved, COPD, former smoker,    breath sounds clear to auscultation       Cardiovascular hypertension, + angina + CAD, + Past MI, + Cardiac Stents, + CABG and + Peripheral Vascular Disease   Rhythm:Regular Rate:Normal  Echo 06/17/19 1. Left ventricular ejection fraction, by estimation, is 55 to 60%. The left ventricle has normal function. The left ventricle demonstrates regional wall motion abnormalities (see scoring diagram/findings for description). Left ventricular diastolic parameters were normal.  2. Right ventricular systolic function is low normal. The right ventricular size is normal. There is moderately elevated pulmonary artery systolic pressure.  3. The mitral valve is normal in structure. Mild mitral valve  regurgitation.  4. The aortic valve is normal in structure. Aortic valve regurgitation is not visualized.  5. The inferior vena cava is normal in size with greater than 50% respiratory variability, suggesting right atrial pressure of 3 mmHg.    Neuro/Psych  Headaches, PSYCHIATRIC DISORDERS Anxiety Depression    GI/Hepatic Neg liver ROS, GERD  ,  Endo/Other  negative endocrine ROS  Renal/GU negative Renal ROS     Musculoskeletal  (+) Arthritis ,   Abdominal   Peds  Hematology  (+) Blood dyscrasia, anemia ,   Anesthesia Other Findings Day of surgery medications reviewed with the patient.  Reproductive/Obstetrics                         Anesthesia Physical Anesthesia Plan  ASA: IV  Anesthesia Plan: General   Post-op Pain Management:     Induction: Intravenous  PONV Risk Score and Plan: 4 or greater and Ondansetron, Dexamethasone and Treatment may vary due to age or medical condition  Airway Management Planned: Oral ETT  Additional Equipment: Arterial line, CVP, PA Cath and Ultrasound Guidance Line Placement  Intra-op Plan:   Post-operative Plan: Possible Post-op intubation/ventilation  Informed Consent: I have reviewed the patients History and Physical, chart, labs and discussed the procedure including the risks, benefits and alternatives for the proposed anesthesia with the patient or authorized representative who has indicated his/her understanding and acceptance.     Dental advisory given  Plan Discussed with: CRNA  Anesthesia Plan Comments: (See PAT note written 06/17/2019 by Myra Gianotti, PA-C.  She has cardiology clearance following 06/05/19 cardiac cath. 06/17/19 echo report was not finalized as of 06/17/19 5:46 PM.   Cardiac cath 06/05/19: Prox RCA to Mid RCA lesion is 100% stenosed. Dist Cx lesion is 80% stenosed. 3rd Mrg lesion is 100% stenosed. 1st Mrg-1 lesion is 100% stenosed. 1st Mrg-2 lesion is 100% stenosed. SVG graft was not visualized. Origin to Prox Graft lesion is 100% stenosed. SVG graft was not visualized. Origin to Prox Graft lesion is 100% stenosed. Prox LAD to Mid LAD lesion is 60% stenosed. 1st Diag lesion is 100% stenosed. LIMA graft was not visualized. Origin to Dist Graft lesion is 100% stenosed. 1. Stable three vessel CAD s/p 3V CABG with known occlusion of all three grafts.  2. Moderate, calcified mid LAD stenosis that is angiographically improved from cath in 2018.  3. Chronic occlusion proximal RCA. The  vessel fills from left to right collaterals.  4. Chronic occlusion obtuse marginal and distal Circumflex.  Recommendations: Continue medical management of CAD. Proceed with planning for LE bypass surgery.  )       Anesthesia Quick Evaluation                                   Anesthesia Evaluation  Patient identified by MRN, date of birth, ID band Patient awake    Reviewed: NPO status , Patient's Chart, lab work & pertinent test results, reviewed documented beta blocker date and time   History of Anesthesia Complications Negative for: history of anesthetic complications  Airway Mallampati: II  TM Distance: >3 FB Neck ROM: Full    Dental  (+) Dental Advisory Given, Missing, Chipped   Pulmonary sleep apnea and Continuous Positive Airway Pressure Ventilation , COPD, former smoker (quit 2000),    breath sounds clear to auscultation       Cardiovascular hypertension, Pt. on home beta blockers and Pt. on medications + angina (NTG 3d ago) + CAD ('18 cath: Cath in May, 2018 shows Attretic LIMA to LAD, occluded SVG to OM, occluded SVG to PDA. ), + Past MI, + Cardiac Stents, + CABG and + Peripheral Vascular Disease  + dysrhythmias Atrial Fibrillation  Rhythm:Regular Rate:Normal  '18 cath: 25-35% by visual estimate, moderate to severe left ventricular systolic dysfunction. Occluded SVG   Neuro/Psych Anxiety Chronic pain: narcotics    GI/Hepatic Neg liver ROS, GERD  Medicated and Controlled,  Endo/Other  negative endocrine ROS  Renal/GU negative Renal ROS     Musculoskeletal  (+) Arthritis ,   Abdominal (+) + obese,   Peds  Hematology  (+) Blood dyscrasia (Hb 10.8, plt 208k), , eliquis   Anesthesia Other Findings   Reproductive/Obstetrics                            Anesthesia Physical Anesthesia Plan  ASA: III  Anesthesia Plan: General   Post-op Pain Management:    Induction: Intravenous  PONV Risk Score and Plan: 4 or greater and Ondansetron, Dexamethasone and Treatment may vary due to age or medical condition  Airway Management Planned: Oral ETT  Additional Equipment: Arterial line  Intra-op Plan:   Post-operative Plan: Extubation in OR  Informed Consent: I have reviewed the patients  History and Physical, chart, labs and discussed the procedure including the risks, benefits and alternatives for the proposed anesthesia with the patient or authorized representative who has indicated his/her understanding and acceptance.   Dental advisory given  Plan Discussed with: CRNA and Surgeon  Anesthesia Plan Comments: (Plan routine monitors, A line, GETA)        Anesthesia Quick Evaluation

## 2019-06-18 ENCOUNTER — Inpatient Hospital Stay (HOSPITAL_COMMUNITY): Payer: Medicare HMO | Admitting: Certified Registered Nurse Anesthetist

## 2019-06-18 ENCOUNTER — Inpatient Hospital Stay (HOSPITAL_COMMUNITY): Payer: Medicare HMO

## 2019-06-18 ENCOUNTER — Inpatient Hospital Stay (HOSPITAL_COMMUNITY)
Admission: RE | Admit: 2019-06-18 | Discharge: 2019-07-12 | DRG: 001 | Disposition: A | Discharge status: Skilled Nursing Facility | Payer: Medicare HMO | Attending: Vascular Surgery | Admitting: Vascular Surgery

## 2019-06-18 ENCOUNTER — Inpatient Hospital Stay (HOSPITAL_COMMUNITY): Payer: Medicare HMO | Admitting: Vascular Surgery

## 2019-06-18 ENCOUNTER — Encounter (HOSPITAL_COMMUNITY)
Admission: RE | Disposition: A | Discharge status: Skilled Nursing Facility | Payer: Self-pay | Source: Home / Self Care | Attending: Vascular Surgery

## 2019-06-18 ENCOUNTER — Encounter (HOSPITAL_COMMUNITY): Payer: Self-pay | Admitting: Vascular Surgery

## 2019-06-18 DIAGNOSIS — Z95811 Presence of heart assist device: Secondary | ICD-10-CM | POA: Diagnosis not present

## 2019-06-18 DIAGNOSIS — Z79891 Long term (current) use of opiate analgesic: Secondary | ICD-10-CM

## 2019-06-18 DIAGNOSIS — N289 Disorder of kidney and ureter, unspecified: Secondary | ICD-10-CM | POA: Diagnosis not present

## 2019-06-18 DIAGNOSIS — Z96641 Presence of right artificial hip joint: Secondary | ICD-10-CM | POA: Diagnosis present

## 2019-06-18 DIAGNOSIS — I25810 Atherosclerosis of coronary artery bypass graft(s) without angina pectoris: Secondary | ICD-10-CM | POA: Diagnosis present

## 2019-06-18 DIAGNOSIS — R001 Bradycardia, unspecified: Secondary | ICD-10-CM | POA: Diagnosis not present

## 2019-06-18 DIAGNOSIS — I739 Peripheral vascular disease, unspecified: Secondary | ICD-10-CM | POA: Diagnosis present

## 2019-06-18 DIAGNOSIS — I5043 Acute on chronic combined systolic (congestive) and diastolic (congestive) heart failure: Secondary | ICD-10-CM | POA: Diagnosis present

## 2019-06-18 DIAGNOSIS — Y832 Surgical operation with anastomosis, bypass or graft as the cause of abnormal reaction of the patient, or of later complication, without mention of misadventure at the time of the procedure: Secondary | ICD-10-CM | POA: Diagnosis present

## 2019-06-18 DIAGNOSIS — I5032 Chronic diastolic (congestive) heart failure: Secondary | ICD-10-CM | POA: Diagnosis not present

## 2019-06-18 DIAGNOSIS — F329 Major depressive disorder, single episode, unspecified: Secondary | ICD-10-CM | POA: Diagnosis present

## 2019-06-18 DIAGNOSIS — N179 Acute kidney failure, unspecified: Secondary | ICD-10-CM | POA: Diagnosis not present

## 2019-06-18 DIAGNOSIS — R197 Diarrhea, unspecified: Secondary | ICD-10-CM | POA: Diagnosis not present

## 2019-06-18 DIAGNOSIS — D62 Acute posthemorrhagic anemia: Secondary | ICD-10-CM | POA: Diagnosis not present

## 2019-06-18 DIAGNOSIS — Z6830 Body mass index (BMI) 30.0-30.9, adult: Secondary | ICD-10-CM

## 2019-06-18 DIAGNOSIS — S5011XA Contusion of right forearm, initial encounter: Secondary | ICD-10-CM | POA: Diagnosis not present

## 2019-06-18 DIAGNOSIS — I9581 Postprocedural hypotension: Secondary | ICD-10-CM | POA: Diagnosis not present

## 2019-06-18 DIAGNOSIS — E785 Hyperlipidemia, unspecified: Secondary | ICD-10-CM | POA: Diagnosis present

## 2019-06-18 DIAGNOSIS — Z91048 Other nonmedicinal substance allergy status: Secondary | ICD-10-CM

## 2019-06-18 DIAGNOSIS — K219 Gastro-esophageal reflux disease without esophagitis: Secondary | ICD-10-CM | POA: Diagnosis present

## 2019-06-18 DIAGNOSIS — F1290 Cannabis use, unspecified, uncomplicated: Secondary | ICD-10-CM | POA: Diagnosis present

## 2019-06-18 DIAGNOSIS — B9620 Unspecified Escherichia coli [E. coli] as the cause of diseases classified elsewhere: Secondary | ICD-10-CM | POA: Diagnosis not present

## 2019-06-18 DIAGNOSIS — R0602 Shortness of breath: Secondary | ICD-10-CM

## 2019-06-18 DIAGNOSIS — K567 Ileus, unspecified: Secondary | ICD-10-CM | POA: Diagnosis not present

## 2019-06-18 DIAGNOSIS — I462 Cardiac arrest due to underlying cardiac condition: Secondary | ICD-10-CM | POA: Diagnosis not present

## 2019-06-18 DIAGNOSIS — Z951 Presence of aortocoronary bypass graft: Secondary | ICD-10-CM

## 2019-06-18 DIAGNOSIS — Z9889 Other specified postprocedural states: Secondary | ICD-10-CM

## 2019-06-18 DIAGNOSIS — I255 Ischemic cardiomyopathy: Secondary | ICD-10-CM | POA: Diagnosis present

## 2019-06-18 DIAGNOSIS — I5022 Chronic systolic (congestive) heart failure: Secondary | ICD-10-CM | POA: Diagnosis not present

## 2019-06-18 DIAGNOSIS — Z981 Arthrodesis status: Secondary | ICD-10-CM

## 2019-06-18 DIAGNOSIS — R570 Cardiogenic shock: Secondary | ICD-10-CM

## 2019-06-18 DIAGNOSIS — J449 Chronic obstructive pulmonary disease, unspecified: Secondary | ICD-10-CM | POA: Diagnosis present

## 2019-06-18 DIAGNOSIS — K9189 Other postprocedural complications and disorders of digestive system: Secondary | ICD-10-CM | POA: Diagnosis not present

## 2019-06-18 DIAGNOSIS — I9589 Other hypotension: Secondary | ICD-10-CM | POA: Diagnosis not present

## 2019-06-18 DIAGNOSIS — G629 Polyneuropathy, unspecified: Secondary | ICD-10-CM | POA: Diagnosis present

## 2019-06-18 DIAGNOSIS — Y840 Cardiac catheterization as the cause of abnormal reaction of the patient, or of later complication, without mention of misadventure at the time of the procedure: Secondary | ICD-10-CM | POA: Diagnosis not present

## 2019-06-18 DIAGNOSIS — R627 Adult failure to thrive: Secondary | ICD-10-CM | POA: Diagnosis not present

## 2019-06-18 DIAGNOSIS — Z7901 Long term (current) use of anticoagulants: Secondary | ICD-10-CM

## 2019-06-18 DIAGNOSIS — I25118 Atherosclerotic heart disease of native coronary artery with other forms of angina pectoris: Secondary | ICD-10-CM | POA: Diagnosis not present

## 2019-06-18 DIAGNOSIS — F419 Anxiety disorder, unspecified: Secondary | ICD-10-CM | POA: Diagnosis present

## 2019-06-18 DIAGNOSIS — E872 Acidosis: Secondary | ICD-10-CM | POA: Diagnosis not present

## 2019-06-18 DIAGNOSIS — Z881 Allergy status to other antibiotic agents status: Secondary | ICD-10-CM

## 2019-06-18 DIAGNOSIS — I110 Hypertensive heart disease with heart failure: Secondary | ICD-10-CM | POA: Diagnosis present

## 2019-06-18 DIAGNOSIS — T82392A Other mechanical complication of femoral arterial graft (bypass), initial encounter: Secondary | ICD-10-CM | POA: Diagnosis present

## 2019-06-18 DIAGNOSIS — R710 Precipitous drop in hematocrit: Secondary | ICD-10-CM | POA: Diagnosis not present

## 2019-06-18 DIAGNOSIS — E669 Obesity, unspecified: Secondary | ICD-10-CM | POA: Diagnosis present

## 2019-06-18 DIAGNOSIS — I509 Heart failure, unspecified: Secondary | ICD-10-CM

## 2019-06-18 DIAGNOSIS — N170 Acute kidney failure with tubular necrosis: Secondary | ICD-10-CM | POA: Diagnosis not present

## 2019-06-18 DIAGNOSIS — J9601 Acute respiratory failure with hypoxia: Secondary | ICD-10-CM | POA: Diagnosis not present

## 2019-06-18 DIAGNOSIS — I4891 Unspecified atrial fibrillation: Secondary | ICD-10-CM | POA: Diagnosis not present

## 2019-06-18 DIAGNOSIS — I480 Paroxysmal atrial fibrillation: Secondary | ICD-10-CM | POA: Diagnosis not present

## 2019-06-18 DIAGNOSIS — Z4659 Encounter for fitting and adjustment of other gastrointestinal appliance and device: Secondary | ICD-10-CM

## 2019-06-18 DIAGNOSIS — G8929 Other chronic pain: Secondary | ICD-10-CM | POA: Diagnosis present

## 2019-06-18 DIAGNOSIS — N390 Urinary tract infection, site not specified: Secondary | ICD-10-CM | POA: Diagnosis not present

## 2019-06-18 DIAGNOSIS — Z880 Allergy status to penicillin: Secondary | ICD-10-CM

## 2019-06-18 DIAGNOSIS — Z978 Presence of other specified devices: Secondary | ICD-10-CM

## 2019-06-18 DIAGNOSIS — Z955 Presence of coronary angioplasty implant and graft: Secondary | ICD-10-CM

## 2019-06-18 DIAGNOSIS — I959 Hypotension, unspecified: Secondary | ICD-10-CM | POA: Diagnosis not present

## 2019-06-18 DIAGNOSIS — I252 Old myocardial infarction: Secondary | ICD-10-CM

## 2019-06-18 DIAGNOSIS — I340 Nonrheumatic mitral (valve) insufficiency: Secondary | ICD-10-CM | POA: Diagnosis not present

## 2019-06-18 DIAGNOSIS — R319 Hematuria, unspecified: Secondary | ICD-10-CM | POA: Diagnosis present

## 2019-06-18 DIAGNOSIS — I9788 Other intraoperative complications of the circulatory system, not elsewhere classified: Secondary | ICD-10-CM | POA: Diagnosis not present

## 2019-06-18 DIAGNOSIS — I469 Cardiac arrest, cause unspecified: Secondary | ICD-10-CM | POA: Diagnosis not present

## 2019-06-18 DIAGNOSIS — G4733 Obstructive sleep apnea (adult) (pediatric): Secondary | ICD-10-CM | POA: Diagnosis present

## 2019-06-18 DIAGNOSIS — Z885 Allergy status to narcotic agent status: Secondary | ICD-10-CM

## 2019-06-18 DIAGNOSIS — I7409 Other arterial embolism and thrombosis of abdominal aorta: Secondary | ICD-10-CM | POA: Diagnosis present

## 2019-06-18 DIAGNOSIS — I2510 Atherosclerotic heart disease of native coronary artery without angina pectoris: Secondary | ICD-10-CM | POA: Diagnosis present

## 2019-06-18 DIAGNOSIS — Z20822 Contact with and (suspected) exposure to covid-19: Secondary | ICD-10-CM | POA: Diagnosis present

## 2019-06-18 DIAGNOSIS — I2582 Chronic total occlusion of coronary artery: Secondary | ICD-10-CM | POA: Diagnosis present

## 2019-06-18 DIAGNOSIS — D649 Anemia, unspecified: Secondary | ICD-10-CM | POA: Diagnosis not present

## 2019-06-18 DIAGNOSIS — D696 Thrombocytopenia, unspecified: Secondary | ICD-10-CM | POA: Diagnosis not present

## 2019-06-18 DIAGNOSIS — Z79899 Other long term (current) drug therapy: Secondary | ICD-10-CM

## 2019-06-18 DIAGNOSIS — I721 Aneurysm of artery of upper extremity: Secondary | ICD-10-CM | POA: Diagnosis not present

## 2019-06-18 DIAGNOSIS — Z8249 Family history of ischemic heart disease and other diseases of the circulatory system: Secondary | ICD-10-CM

## 2019-06-18 DIAGNOSIS — Z888 Allergy status to other drugs, medicaments and biological substances status: Secondary | ICD-10-CM

## 2019-06-18 DIAGNOSIS — E876 Hypokalemia: Secondary | ICD-10-CM | POA: Diagnosis not present

## 2019-06-18 DIAGNOSIS — Z86718 Personal history of other venous thrombosis and embolism: Secondary | ICD-10-CM

## 2019-06-18 DIAGNOSIS — I70222 Atherosclerosis of native arteries of extremities with rest pain, left leg: Secondary | ICD-10-CM | POA: Diagnosis not present

## 2019-06-18 DIAGNOSIS — Z7902 Long term (current) use of antithrombotics/antiplatelets: Secondary | ICD-10-CM

## 2019-06-18 DIAGNOSIS — Z9049 Acquired absence of other specified parts of digestive tract: Secondary | ICD-10-CM

## 2019-06-18 DIAGNOSIS — J9690 Respiratory failure, unspecified, unspecified whether with hypoxia or hypercapnia: Secondary | ICD-10-CM

## 2019-06-18 DIAGNOSIS — Z87891 Personal history of nicotine dependence: Secondary | ICD-10-CM

## 2019-06-18 HISTORY — PX: AORTA - BILATERAL FEMORAL ARTERY BYPASS GRAFT: SHX1175

## 2019-06-18 LAB — BLOOD GAS, ARTERIAL
Acid-base deficit: 2.3 mmol/L — ABNORMAL HIGH (ref 0.0–2.0)
Bicarbonate: 23.2 mmol/L (ref 20.0–28.0)
FIO2: 44
O2 Saturation: 98.5 %
Patient temperature: 36.8
pCO2 arterial: 47.7 mmHg (ref 32.0–48.0)
pH, Arterial: 7.306 — ABNORMAL LOW (ref 7.350–7.450)
pO2, Arterial: 133 mmHg — ABNORMAL HIGH (ref 83.0–108.0)

## 2019-06-18 LAB — BASIC METABOLIC PANEL
Anion gap: 8 (ref 5–15)
BUN: 25 mg/dL — ABNORMAL HIGH (ref 8–23)
CO2: 22 mmol/L (ref 22–32)
Calcium: 8 mg/dL — ABNORMAL LOW (ref 8.9–10.3)
Chloride: 109 mmol/L (ref 98–111)
Creatinine, Ser: 1.4 mg/dL — ABNORMAL HIGH (ref 0.44–1.00)
GFR calc Af Amer: 45 mL/min — ABNORMAL LOW (ref >60)
GFR calc non Af Amer: 39 mL/min — ABNORMAL LOW (ref >60)
Glucose, Bld: 196 mg/dL — ABNORMAL HIGH (ref 70–99)
Potassium: 4.4 mmol/L (ref 3.5–5.1)
Sodium: 139 mmol/L (ref 135–145)

## 2019-06-18 LAB — CBC
HCT: 33.5 % — ABNORMAL LOW (ref 36.0–46.0)
Hemoglobin: 10.5 g/dL — ABNORMAL LOW (ref 12.0–15.0)
MCH: 28.7 pg (ref 26.0–34.0)
MCHC: 31.3 g/dL (ref 30.0–36.0)
MCV: 91.5 fL (ref 80.0–100.0)
Platelets: 188 10*3/uL (ref 150–400)
RBC: 3.66 MIL/uL — ABNORMAL LOW (ref 3.87–5.11)
RDW: 15.2 % (ref 11.5–15.5)
WBC: 6.7 10*3/uL (ref 4.0–10.5)
nRBC: 0 % (ref 0.0–0.2)

## 2019-06-18 LAB — APTT: aPTT: 25 seconds (ref 24–36)

## 2019-06-18 LAB — MAGNESIUM: Magnesium: 1.4 mg/dL — ABNORMAL LOW (ref 1.7–2.4)

## 2019-06-18 LAB — PROTIME-INR
INR: 1.4 — ABNORMAL HIGH (ref 0.8–1.2)
Prothrombin Time: 16.6 seconds — ABNORMAL HIGH (ref 11.4–15.2)

## 2019-06-18 LAB — PREPARE RBC (CROSSMATCH)

## 2019-06-18 SURGERY — CREATION, BYPASS, ARTERIAL, AORTA TO FEMORAL, BILATERAL, USING GRAFT
Anesthesia: General | Site: Abdomen

## 2019-06-18 MED ORDER — MUPIROCIN 2 % EX OINT
1.0000 "application " | TOPICAL_OINTMENT | Freq: Two times a day (BID) | CUTANEOUS | Status: AC
  Administered 2019-06-18 – 2019-06-23 (×9): 1 via NASAL
  Filled 2019-06-18 (×2): qty 22

## 2019-06-18 MED ORDER — SODIUM CHLORIDE 0.9 % IV SOLN
INTRAVENOUS | Status: DC | PRN
  Administered 2019-06-18: 500 mL

## 2019-06-18 MED ORDER — CLINDAMYCIN PHOSPHATE 300 MG/50ML IV SOLN
300.0000 mg | Freq: Three times a day (TID) | INTRAVENOUS | Status: AC
  Administered 2019-06-18 – 2019-06-19 (×2): 300 mg via INTRAVENOUS
  Filled 2019-06-18 (×2): qty 50

## 2019-06-18 MED ORDER — CHLORHEXIDINE GLUCONATE CLOTH 2 % EX PADS
6.0000 | MEDICATED_PAD | Freq: Every day | CUTANEOUS | Status: DC
  Administered 2019-06-18 – 2019-06-28 (×9): 6 via TOPICAL

## 2019-06-18 MED ORDER — MEPERIDINE HCL 25 MG/ML IJ SOLN: 6.2500 mg | INTRAMUSCULAR | Status: DC | PRN

## 2019-06-18 MED ORDER — PHENYLEPHRINE HCL-NACL 10-0.9 MG/250ML-% IV SOLN: INTRAVENOUS | Status: DC | PRN

## 2019-06-18 MED ORDER — DEXAMETHASONE SODIUM PHOSPHATE 10 MG/ML IJ SOLN
INTRAMUSCULAR | Status: DC | PRN
  Administered 2019-06-18: 10 mg via INTRAVENOUS

## 2019-06-18 MED ORDER — DIPHENHYDRAMINE HCL 50 MG/ML IJ SOLN: 12.5000 mg | Freq: Four times a day (QID) | INTRAMUSCULAR | Status: DC | PRN

## 2019-06-18 MED ORDER — HYDRALAZINE HCL 20 MG/ML IJ SOLN
INTRAMUSCULAR | Status: AC
  Filled 2019-06-18: qty 1

## 2019-06-18 MED ORDER — METOPROLOL TARTRATE 5 MG/5ML IV SOLN: 2.0000 mg | INTRAVENOUS | Status: DC | PRN

## 2019-06-18 MED ORDER — SODIUM CHLORIDE 0.9% IV SOLUTION: Freq: Once | INTRAVENOUS | Status: DC

## 2019-06-18 MED ORDER — ONDANSETRON HCL 4 MG/2ML IJ SOLN
INTRAMUSCULAR | Status: DC | PRN
  Administered 2019-06-18: 4 mg via INTRAVENOUS

## 2019-06-18 MED ORDER — ALBUMIN HUMAN 5 % IV SOLN: INTRAVENOUS | Status: DC | PRN

## 2019-06-18 MED ORDER — FENTANYL CITRATE (PF) 250 MCG/5ML IJ SOLN
INTRAMUSCULAR | Status: DC | PRN
  Administered 2019-06-18: 50 ug via INTRAVENOUS
  Administered 2019-06-18: 150 ug via INTRAVENOUS
  Administered 2019-06-18 (×3): 50 ug via INTRAVENOUS

## 2019-06-18 MED ORDER — LABETALOL HCL 5 MG/ML IV SOLN: 10.0000 mg | INTRAVENOUS | Status: DC | PRN

## 2019-06-18 MED ORDER — SODIUM CHLORIDE 0.9 % IV SOLN: INTRAVENOUS | Status: DC

## 2019-06-18 MED ORDER — HYDROMORPHONE HCL 1 MG/ML IJ SOLN
1.0000 mg | Freq: Once | INTRAMUSCULAR | Status: AC
  Administered 2019-06-18: 1 mg via INTRAVENOUS
  Filled 2019-06-18: qty 1

## 2019-06-18 MED ORDER — HYDRALAZINE HCL 20 MG/ML IJ SOLN: 5.0000 mg | INTRAMUSCULAR | Status: DC | PRN

## 2019-06-18 MED ORDER — ROCURONIUM BROMIDE 10 MG/ML (PF) SYRINGE
PREFILLED_SYRINGE | INTRAVENOUS | Status: AC
  Filled 2019-06-18: qty 10

## 2019-06-18 MED ORDER — CHLORHEXIDINE GLUCONATE CLOTH 2 % EX PADS: 6.0000 | MEDICATED_PAD | Freq: Once | CUTANEOUS | Status: DC

## 2019-06-18 MED ORDER — LACTATED RINGERS IV SOLN: INTRAVENOUS | Status: DC | PRN

## 2019-06-18 MED ORDER — ONDANSETRON HCL 4 MG/2ML IJ SOLN: 4.0000 mg | Freq: Four times a day (QID) | INTRAMUSCULAR | Status: DC | PRN

## 2019-06-18 MED ORDER — PROPOFOL 10 MG/ML IV BOLUS
INTRAVENOUS | Status: DC | PRN
  Administered 2019-06-18: 90 mg via INTRAVENOUS

## 2019-06-18 MED ORDER — SODIUM CHLORIDE 0.9 % IV SOLN: INTRAVENOUS | Status: DC | PRN

## 2019-06-18 MED ORDER — DEXAMETHASONE SODIUM PHOSPHATE 10 MG/ML IJ SOLN
INTRAMUSCULAR | Status: AC
  Filled 2019-06-18: qty 1

## 2019-06-18 MED ORDER — SODIUM CHLORIDE 0.9 % IV SOLN
500.0000 mL | Freq: Once | INTRAVENOUS | Status: AC | PRN
  Administered 2019-06-19: 500 mL via INTRAVENOUS

## 2019-06-18 MED ORDER — ONDANSETRON HCL 4 MG/2ML IJ SOLN
4.0000 mg | Freq: Four times a day (QID) | INTRAMUSCULAR | Status: DC | PRN
  Administered 2019-06-19 – 2019-07-12 (×10): 4 mg via INTRAVENOUS
  Filled 2019-06-18 (×9): qty 2

## 2019-06-18 MED ORDER — MORPHINE SULFATE (PF) 2 MG/ML IV SOLN
2.0000 mg | INTRAVENOUS | Status: DC | PRN
  Administered 2019-06-18: 2 mg via INTRAVENOUS
  Filled 2019-06-18: qty 1

## 2019-06-18 MED ORDER — 0.9 % SODIUM CHLORIDE (POUR BTL) OPTIME
TOPICAL | Status: DC | PRN
  Administered 2019-06-18: 2000 mL

## 2019-06-18 MED ORDER — HEPARIN SODIUM (PORCINE) 1000 UNIT/ML IJ SOLN
INTRAMUSCULAR | Status: DC | PRN
  Administered 2019-06-18: 8000 [IU] via INTRAVENOUS

## 2019-06-18 MED ORDER — FENTANYL CITRATE (PF) 250 MCG/5ML IJ SOLN
INTRAMUSCULAR | Status: AC
  Filled 2019-06-18: qty 5

## 2019-06-18 MED ORDER — LACTATED RINGERS IV SOLN: INTRAVENOUS | Status: DC

## 2019-06-18 MED ORDER — CHLORHEXIDINE GLUCONATE CLOTH 2 % EX PADS
6.0000 | MEDICATED_PAD | Freq: Every day | CUTANEOUS | Status: AC
  Administered 2019-06-18 – 2019-06-22 (×4): 6 via TOPICAL

## 2019-06-18 MED ORDER — SUGAMMADEX SODIUM 200 MG/2ML IV SOLN
INTRAVENOUS | Status: DC | PRN
  Administered 2019-06-18: 200 mg via INTRAVENOUS

## 2019-06-18 MED ORDER — MORPHINE SULFATE 2 MG/ML IV SOLN: INTRAVENOUS | Status: DC

## 2019-06-18 MED ORDER — SODIUM CHLORIDE 0.9 % IV SOLN
INTRAVENOUS | Status: AC
  Filled 2019-06-18: qty 1.2

## 2019-06-18 MED ORDER — ONDANSETRON HCL 4 MG/2ML IJ SOLN: 4.0000 mg | Freq: Once | INTRAMUSCULAR | Status: DC | PRN

## 2019-06-18 MED ORDER — MAGNESIUM SULFATE 2 GM/50ML IV SOLN
2.0000 g | Freq: Every day | INTRAVENOUS | Status: AC | PRN
  Administered 2019-06-18: 2 g via INTRAVENOUS
  Filled 2019-06-18: qty 50

## 2019-06-18 MED ORDER — NALOXONE HCL 0.4 MG/ML IJ SOLN: 0.4000 mg | INTRAMUSCULAR | Status: DC | PRN

## 2019-06-18 MED ORDER — MORPHINE SULFATE (PF) 2 MG/ML IV SOLN
INTRAVENOUS | Status: AC
  Administered 2019-06-18: 2 mg via INTRAVENOUS
  Filled 2019-06-18: qty 1

## 2019-06-18 MED ORDER — PROPOFOL 10 MG/ML IV BOLUS
INTRAVENOUS | Status: AC
  Filled 2019-06-18: qty 40

## 2019-06-18 MED ORDER — HEMOSTATIC AGENTS (NO CHARGE) OPTIME
TOPICAL | Status: DC | PRN
  Administered 2019-06-18 (×2): 1 via TOPICAL

## 2019-06-18 MED ORDER — DIPHENHYDRAMINE HCL 12.5 MG/5ML PO ELIX
12.5000 mg | ORAL_SOLUTION | Freq: Four times a day (QID) | ORAL | Status: DC | PRN
  Filled 2019-06-18 (×2): qty 5

## 2019-06-18 MED ORDER — GUAIFENESIN-DM 100-10 MG/5ML PO SYRP: 15.0000 mL | ORAL_SOLUTION | ORAL | Status: DC | PRN

## 2019-06-18 MED ORDER — ORAL CARE MOUTH RINSE
15.0000 mL | Freq: Two times a day (BID) | OROMUCOSAL | Status: DC
  Administered 2019-06-19 – 2019-06-23 (×9): 15 mL via OROMUCOSAL

## 2019-06-18 MED ORDER — SODIUM CHLORIDE 0.9% FLUSH: 9.0000 mL | INTRAVENOUS | Status: DC | PRN

## 2019-06-18 MED ORDER — PROTAMINE SULFATE 10 MG/ML IV SOLN
INTRAVENOUS | Status: DC | PRN
  Administered 2019-06-18 (×2): 20 mg via INTRAVENOUS

## 2019-06-18 MED ORDER — ONDANSETRON HCL 4 MG/2ML IJ SOLN
INTRAMUSCULAR | Status: AC
  Filled 2019-06-18: qty 2

## 2019-06-18 MED ORDER — EPHEDRINE SULFATE 50 MG/ML IJ SOLN: INTRAMUSCULAR | Status: DC | PRN

## 2019-06-18 MED ORDER — PHENYLEPHRINE 40 MCG/ML (10ML) SYRINGE FOR IV PUSH (FOR BLOOD PRESSURE SUPPORT)
PREFILLED_SYRINGE | INTRAVENOUS | Status: DC | PRN
  Administered 2019-06-18: 80 ug via INTRAVENOUS
  Administered 2019-06-18: 40 ug via INTRAVENOUS
  Administered 2019-06-18: 160 ug via INTRAVENOUS
  Administered 2019-06-18: 120 ug via INTRAVENOUS

## 2019-06-18 MED ORDER — MIDAZOLAM HCL 5 MG/5ML IJ SOLN
INTRAMUSCULAR | Status: DC | PRN
  Administered 2019-06-18: 1 mg via INTRAVENOUS

## 2019-06-18 MED ORDER — EPHEDRINE 5 MG/ML INJ
INTRAVENOUS | Status: AC
  Filled 2019-06-18: qty 10

## 2019-06-18 MED ORDER — ALUM & MAG HYDROXIDE-SIMETH 200-200-20 MG/5ML PO SUSP: 15.0000 mL | ORAL | Status: DC | PRN

## 2019-06-18 MED ORDER — DIPHENHYDRAMINE HCL 12.5 MG/5ML PO ELIX: 12.5000 mg | ORAL_SOLUTION | Freq: Four times a day (QID) | ORAL | Status: DC | PRN

## 2019-06-18 MED ORDER — VANCOMYCIN HCL IN DEXTROSE 1-5 GM/200ML-% IV SOLN
INTRAVENOUS | Status: AC
  Administered 2019-06-18: 1000 mg via INTRAVENOUS
  Filled 2019-06-18: qty 200

## 2019-06-18 MED ORDER — MORPHINE SULFATE 2 MG/ML IV SOLN
INTRAVENOUS | Status: DC
  Administered 2019-06-19: 13.5 mg via INTRAVENOUS
  Administered 2019-06-19: 12 mg via INTRAVENOUS
  Administered 2019-06-19: 9 mg via INTRAVENOUS
  Administered 2019-06-19: 16 mg via INTRAVENOUS
  Administered 2019-06-20: 16.5 mg via INTRAVENOUS
  Administered 2019-06-20: 28.5 mg via INTRAVENOUS
  Administered 2019-06-20: 16.5 mg via INTRAVENOUS
  Administered 2019-06-21: 7.5 mg via INTRAVENOUS
  Administered 2019-06-21: 16.5 mg via INTRAVENOUS
  Administered 2019-06-21: 18 mg via INTRAVENOUS
  Administered 2019-06-21: 19.5 mg via INTRAVENOUS
  Administered 2019-06-21: 15 mg via INTRAVENOUS
  Administered 2019-06-21 (×2): 6 mg via INTRAVENOUS
  Administered 2019-06-22: 13.5 mg via INTRAVENOUS
  Administered 2019-06-22: 3 mg via INTRAVENOUS
  Administered 2019-06-22: 15 mg via INTRAVENOUS
  Administered 2019-06-22: 9 mg via INTRAVENOUS
  Administered 2019-06-23: 3 mg via INTRAVENOUS
  Filled 2019-06-18 (×5): qty 30

## 2019-06-18 MED ORDER — PHENYLEPHRINE 40 MCG/ML (10ML) SYRINGE FOR IV PUSH (FOR BLOOD PRESSURE SUPPORT)
PREFILLED_SYRINGE | INTRAVENOUS | Status: AC
  Filled 2019-06-18: qty 20

## 2019-06-18 MED ORDER — VANCOMYCIN HCL IN DEXTROSE 1-5 GM/200ML-% IV SOLN: 1000.0000 mg | INTRAVENOUS | Status: AC

## 2019-06-18 MED ORDER — MIDAZOLAM HCL 2 MG/2ML IJ SOLN
INTRAMUSCULAR | Status: AC
  Filled 2019-06-18: qty 2

## 2019-06-18 MED ORDER — PANTOPRAZOLE SODIUM 40 MG PO TBEC: 40.0000 mg | DELAYED_RELEASE_TABLET | Freq: Every day | ORAL | Status: DC

## 2019-06-18 MED ORDER — POTASSIUM CHLORIDE CRYS ER 20 MEQ PO TBCR
20.0000 meq | EXTENDED_RELEASE_TABLET | Freq: Every day | ORAL | Status: AC | PRN
  Administered 2019-06-26: 40 meq via ORAL
  Filled 2019-06-18: qty 2

## 2019-06-18 MED ORDER — PHENOL 1.4 % MT LIQD: 1.0000 | OROMUCOSAL | Status: DC | PRN

## 2019-06-18 MED ORDER — HEPARIN SODIUM (PORCINE) 1000 UNIT/ML IJ SOLN
INTRAMUSCULAR | Status: AC
  Filled 2019-06-18: qty 1

## 2019-06-18 MED ORDER — ACETAMINOPHEN 325 MG PO TABS
325.0000 mg | ORAL_TABLET | ORAL | Status: DC | PRN
  Administered 2019-06-25: 325 mg via ORAL
  Administered 2019-06-25 – 2019-07-11 (×7): 650 mg via ORAL
  Filled 2019-06-18 (×10): qty 2

## 2019-06-18 MED ORDER — LIDOCAINE 2% (20 MG/ML) 5 ML SYRINGE
INTRAMUSCULAR | Status: AC
  Filled 2019-06-18: qty 5

## 2019-06-18 MED ORDER — EPHEDRINE SULFATE-NACL 50-0.9 MG/10ML-% IV SOSY
PREFILLED_SYRINGE | INTRAVENOUS | Status: DC | PRN
  Administered 2019-06-18: 5 mg via INTRAVENOUS
  Administered 2019-06-18 (×3): 10 mg via INTRAVENOUS
  Administered 2019-06-18 (×4): 5 mg via INTRAVENOUS

## 2019-06-18 MED ORDER — HYDROMORPHONE HCL 1 MG/ML IJ SOLN
0.2500 mg | INTRAMUSCULAR | Status: DC | PRN
  Administered 2019-06-18 (×2): 0.25 mg via INTRAVENOUS

## 2019-06-18 MED ORDER — ROCURONIUM BROMIDE 10 MG/ML (PF) SYRINGE
PREFILLED_SYRINGE | INTRAVENOUS | Status: DC | PRN
  Administered 2019-06-18: 70 mg via INTRAVENOUS
  Administered 2019-06-18: 20 mg via INTRAVENOUS
  Administered 2019-06-18 (×2): 30 mg via INTRAVENOUS

## 2019-06-18 MED ORDER — SODIUM CHLORIDE 0.9 % IV SOLN
INTRAVENOUS | Status: DC | PRN
  Administered 2019-06-18 (×2): 35 ug/min via INTRAVENOUS

## 2019-06-18 MED ORDER — ACETAMINOPHEN 325 MG RE SUPP: 325.0000 mg | RECTAL | Status: DC | PRN

## 2019-06-18 MED ORDER — HYDROMORPHONE HCL 1 MG/ML IJ SOLN
INTRAMUSCULAR | Status: AC
  Filled 2019-06-18: qty 1

## 2019-06-18 SURGICAL SUPPLY — 65 items
ADH SKN CLS APL DERMABOND .7 (GAUZE/BANDAGES/DRESSINGS) ×6
BAG ISL DRAPE 18X18 STRL (DRAPES) ×2
BAG ISOLATION DRAPE 18X18 (DRAPES) IMPLANT
CANISTER SUCT 3000ML PPV (MISCELLANEOUS) ×3 IMPLANT
CLIP VESOCCLUDE MED 24/CT (CLIP) ×5 IMPLANT
CLIP VESOCCLUDE SM WIDE 24/CT (CLIP) ×3 IMPLANT
COVER WAND RF STERILE (DRAPES) ×3 IMPLANT
DERMABOND ADVANCED (GAUZE/BANDAGES/DRESSINGS) ×12
DERMABOND ADVANCED .7 DNX12 (GAUZE/BANDAGES/DRESSINGS) ×2 IMPLANT
DRAPE ISOLATION BAG 18X18 (DRAPES) ×6
ELECT BLADE 4.0 EZ CLEAN MEGAD (MISCELLANEOUS) ×3
ELECT BLADE 6.5 EXT (BLADE) IMPLANT
ELECT CAUTERY BLADE 6.4 (BLADE) ×2 IMPLANT
ELECT REM PT RETURN 9FT ADLT (ELECTROSURGICAL) ×3
ELECTRODE BLDE 4.0 EZ CLN MEGD (MISCELLANEOUS) ×1 IMPLANT
ELECTRODE REM PT RTRN 9FT ADLT (ELECTROSURGICAL) ×1 IMPLANT
FELT TEFLON 1X6 (MISCELLANEOUS) IMPLANT
GAUZE SPONGE 4X4 12PLY STRL (GAUZE/BANDAGES/DRESSINGS) ×2 IMPLANT
GEL ULTRASOUND 20GR AQUASONIC (MISCELLANEOUS) ×2 IMPLANT
GLOVE BIO SURGEON STRL SZ7.5 (GLOVE) ×3 IMPLANT
GLOVE BIOGEL PI IND STRL 6.5 (GLOVE) IMPLANT
GLOVE BIOGEL PI INDICATOR 6.5 (GLOVE) ×10
GLOVE ECLIPSE 6.5 STRL STRAW (GLOVE) ×2 IMPLANT
GLOVE ECLIPSE 7.5 STRL STRAW (GLOVE) IMPLANT
GLOVE SURG SS PI 6.5 STRL IVOR (GLOVE) ×2 IMPLANT
GOWN STRL REUS W/ TWL LRG LVL3 (GOWN DISPOSABLE) ×2 IMPLANT
GOWN STRL REUS W/ TWL XL LVL3 (GOWN DISPOSABLE) ×1 IMPLANT
GOWN STRL REUS W/TWL LRG LVL3 (GOWN DISPOSABLE) ×12
GOWN STRL REUS W/TWL XL LVL3 (GOWN DISPOSABLE) ×15
GRAFT HEMASHIELD 14X7MM (Vascular Products) ×2 IMPLANT
HEMOSTAT SNOW SURGICEL 2X4 (HEMOSTASIS) ×4 IMPLANT
INSERT FOGARTY 61MM (MISCELLANEOUS) IMPLANT
INSERT FOGARTY SM (MISCELLANEOUS) ×6 IMPLANT
KIT BASIN OR (CUSTOM PROCEDURE TRAY) ×3 IMPLANT
KIT TURNOVER KIT B (KITS) ×3 IMPLANT
NS IRRIG 1000ML POUR BTL (IV SOLUTION) ×6 IMPLANT
PACK AORTA (CUSTOM PROCEDURE TRAY) ×3 IMPLANT
PAD ARMBOARD 7.5X6 YLW CONV (MISCELLANEOUS) ×6 IMPLANT
PENCIL BUTTON HOLSTER BLD 10FT (ELECTRODE) ×2 IMPLANT
RETAINER VISCERA MED (MISCELLANEOUS) ×3 IMPLANT
SUT MNCRL AB 4-0 PS2 18 (SUTURE) ×6 IMPLANT
SUT PDS AB 1 TP1 54 (SUTURE) ×6 IMPLANT
SUT PROLENE 3 0 SH 48 (SUTURE) ×7 IMPLANT
SUT PROLENE 3 0 SH DA (SUTURE) ×2 IMPLANT
SUT PROLENE 5 0 C 1 24 (SUTURE) ×20 IMPLANT
SUT PROLENE 5 0 C 1 36 (SUTURE) IMPLANT
SUT PROLENE 6 0 BV (SUTURE) ×8 IMPLANT
SUT SILK 2 0 (SUTURE) ×3
SUT SILK 2 0 TIES 17X18 (SUTURE) ×3
SUT SILK 2 0SH CR/8 30 (SUTURE) ×3 IMPLANT
SUT SILK 2-0 18XBRD TIE 12 (SUTURE) ×1 IMPLANT
SUT SILK 2-0 18XBRD TIE BLK (SUTURE) ×1 IMPLANT
SUT SILK 3 0 (SUTURE) ×3
SUT SILK 3 0 TIES 17X18 (SUTURE) ×3
SUT SILK 3-0 18XBRD TIE 12 (SUTURE) ×1 IMPLANT
SUT SILK 3-0 18XBRD TIE BLK (SUTURE) ×1 IMPLANT
SUT VIC AB 2-0 CT1 27 (SUTURE) ×15
SUT VIC AB 2-0 CT1 TAPERPNT 27 (SUTURE) ×5 IMPLANT
SUT VIC AB 3-0 SH 27 (SUTURE) ×12
SUT VIC AB 3-0 SH 27X BRD (SUTURE) ×2 IMPLANT
SUT VIC AB 4-0 PS2 18 (SUTURE) ×4 IMPLANT
TOWEL GREEN STERILE (TOWEL DISPOSABLE) ×3 IMPLANT
TOWEL SURG RFD BLUE STRL DISP (DISPOSABLE) ×3 IMPLANT
TRAY FOLEY MTR SLVR 16FR STAT (SET/KITS/TRAYS/PACK) ×3 IMPLANT
WATER STERILE IRR 1000ML POUR (IV SOLUTION) ×6 IMPLANT

## 2019-06-18 NOTE — Anesthesia Procedure Notes (Signed)
Central Venous Catheter Insertion Performed by: Nolon Nations, MD, anesthesiologist Start/End4/13/2021 7:20 AM, 06/18/2019 7:40 AM Patient location: Pre-op. Preanesthetic checklist: patient identified, IV checked, site marked, risks and benefits discussed, surgical consent, monitors and equipment checked, pre-op evaluation, timeout performed and anesthesia consent Position: Trendelenburg Lidocaine 1% used for infiltration and patient sedated Hand hygiene performed  and maximum sterile barriers used  Catheter size: 8.5 Fr PA cath was placed.Sheath introducer PA Cath depth:41 Procedure performed using ultrasound guided technique. Ultrasound Notes:anatomy identified, needle tip was noted to be adjacent to the nerve/plexus identified, no ultrasound evidence of intravascular and/or intraneural injection and image(s) printed for medical record Attempts: 1 Following insertion, line sutured, dressing applied and Biopatch. Post procedure assessment: blood return through all ports, free fluid flow and no air  Patient tolerated the procedure well with no immediate complications.

## 2019-06-18 NOTE — Plan of Care (Signed)
  Problem: Education: Goal: Knowledge of the prescribed therapeutic regimen will improve Outcome: Progressing   Problem: Bowel/Gastric: Goal: Gastrointestinal status for postoperative course will improve Outcome: Progressing   Problem: Cardiac: Goal: Ability to maintain an adequate cardiac output will improve Outcome: Progressing   Problem: Clinical Measurements: Goal: Postoperative complications will be avoided or minimized Outcome: Progressing   Problem: Respiratory: Goal: Respiratory status will improve Outcome: Progressing   Problem: Skin Integrity: Goal: Demonstration of wound healing without infection will improve Outcome: Progressing   

## 2019-06-18 NOTE — Transfer of Care (Signed)
Immediate Anesthesia Transfer of Care Note  Patient: Wendy [name]  Procedure(s) Performed: AORTA BIFEMORAL BYPASS GRAFT (N/A Abdomen)  Patient Location: PACU  Anesthesia Type:General  Level of Consciousness: awake and drowsy  Airway & Oxygen Therapy: Patient Spontanous Breathing and Patient connected to face mask oxygen  Post-op Assessment: Report given to RN, Post -op Vital signs reviewed and stable and Patient moving all extremities X 4  Post vital signs: Reviewed and stable  Last Vitals:  Vitals Value Taken Time  BP    Temp    Pulse    Resp    SpO2      Last Pain:  Vitals:   06/18/19 0634  TempSrc:   PainSc: 8       Patients Stated Pain Goal: 3 ([id number])  Complications: No apparent anesthesia complications

## 2019-06-18 NOTE — Anesthesia Procedure Notes (Addendum)
Procedure Name: Intubation Date/Time: 06/18/2019 7:59 AM Performed by: Harden Mo, CRNA Pre-anesthesia Checklist: Patient identified, Emergency Drugs available, Suction available and Patient being monitored Patient Re-evaluated:Patient Re-evaluated prior to induction Oxygen Delivery Method: Circle System Utilized Preoxygenation: Pre-oxygenation with 100% oxygen Induction Type: IV induction Ventilation: Mask ventilation without difficulty Laryngoscope Size: Mac and 3 Grade View: Grade I Tube type: Oral Tube size: 7.5 mm Number of attempts: 1 Airway Equipment and Method: Stylet and Oral airway Placement Confirmation: ETT inserted through vocal cords under direct vision,  positive ETCO2 and breath sounds checked- equal and bilateral Secured at: 22 cm Tube secured with: Tape Dental Injury: Teeth and Oropharynx as per pre-operative assessment  Comments: [place] performed

## 2019-06-18 NOTE — Op Note (Signed)
Patient name: Wendy [name] MRN: [id number] DOB: 02/12/1951 Sex: female  06/18/2019 Pre-operative Diagnosis: critical right lower extremity with rest pain Post-operative diagnosis:  Same Surgeon:  Erlene Quan C. Donzetta Matters, MD Assistants: Gae Gallop, MD; Arlee Muslim, [place] Procedure Performed: 1.  Reexploration bilateral common femoral arteries greater than 30 days 2.  Aortobifemoral bypass with 14 x 7 mm Hemashield 3.  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  She is critical right lower extremity ischemia with rest pain.  She is indicated for aortobifemoral bypass.  She has undergone coronary angiography she is moderate risk.  Findings: Her aorta was soft at the level of the renal arteries heavily calcified below the IMA.  We transected just above the IMA oversewed the distal aorta and the graft was sewn in end-to-end fashion.  Left common femoral artery there was a previous graft we are able to sew at the level of the existing graft.  The right side graft was occluded as was common femoral artery.  We dissected out the SFA the graft was placed to the SFA end to end.  We dissected out multiple profunda branches on the right side.  The largest of the branches we spatulated and sewed end-to-side to the aortobifemoral bypass limb.  At completion of good signals at the dorsalis pedis bilaterally.  She was awake from anesthesia was able to be extubated at completion.  Dr. Scot Dock and Arlee Muslim, PA were necessary assistants during this case given the complexity and patient's medical comorbidities.   Procedure:  The patient was identified in the holding area and taken to the operating where she is placed supine operative table general anesthesia was induced.  She was gently prepped draped in the abdomen bilateral lower extremities on usual fashion, antibiotics  were administered and a timeout was called.  We began with bilateral exposing of her groin.  Dr. Scot Dock was working on the right in the right groin and me in the left groin.  On the left side I dissected out the graft.  This was transected.  I traced it back to the external leg artery placed Vesseloops around this.  I dissected out the profunda and SFA and placed Vesseloops around these.  I then began our tunnel.  On the right side Dr. Scot Dock also transected the graft.  There was significant scar tissue.  There is no identifiable profunda femoris artery.  The dissection of the SFA for several centimeters.  It is segment of multiple profunda branches that did not appear connected to the common femoral artery.  There was some bleeding under the inguinal ligament this was controlled with 5-0 Prolene suture.  He did begin with tunnel.  Concomitantly I began with midline abdominal incision.  There were significant adhesions and well taken down.  The colon was reflected cephalad.  Initially trouble with the NG tube were ultimately able to get this in place with fixed paresthesia.  The small intestine was mobilized to the patient's right.  The ligament of Treitz was taken down.  Mild retractor was placed.  We dissected out the proximal aorta.  The renal vein was identified and retracted cephalad.  Both renal arteries were identified and the aorta was soft occluded at the level.  I dissected out the IMA plan to clamp above this to transect the aorta.  We then dissected down onto the common iliac artery  bifurcation.  We completed tunnels to both cords and placed umbilical tapes the patient was fully heparinized.  We then clamped the aorta just above the IMA to just below the renal arteries and transected.  I oversewed it distally with 3-0 Prolene suture.  I then trimmed a 14 x 7 mm dacryon Hemashield graft to size and sewed this into into the aorta with 3 Prolene suture.  Upon completion we released our clamp.  We then  tunneled maintaining orientation of the graft.  We had good pulsatile flow to both groins.  The graft was reclamped.  On the left side Dr. Doren Custard remove the existing graft in the common femoral artery after clamping all inflow and outflow vessels.  And then sewed the graft end-to-side with 5-0 Prolene suture.  Prior to completion of flushing maneuvers pain.  Patient tolerated taking the clamps off.  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  I then sewed the graft and taken to the SFA.  Start ablation again flushing maneuvers were undertaken.  Upon completion patient did have some hypotension but she stabilized quickly.  We then dissected down to the profunda branches.  I ligated off with the largest one.  There was good backbleeding from this point.  We spatulated this and sent to the back of our aortobifemoral graft.  To do this we clamped the graft proximally distally and perform graftotomy.  The graft was ascended to side.  Upon completion of a good signal in the profunda.  There were good signals at the dorsalis pedis arteries just beyond the ankle bilaterally.  50 mg of protamine was administered.  We obtain hemostasis in the bilateral groins and these were closed in layers with 2 oh, 3-0 and 4-0 Vicryl.  In the abdomen the examined all the bowel this appeared to be viable.  There were no abnormalities save for one small diverticulum proximal jejunum.  We closed the retroperitoneum over the aortic graft.  We returned the intestines to the abdominal cavity.  The omentum was pulled down.  We closed the midline fascia with PDS.  A few 3-0 Vicryl sutures were placed and the skin was closed with 4-0 Monocryl.  She was then awakened anesthesia having tolerated procedure well immediate complication.  Counts were correct at completion.  EBL: 450cc  Transfusion: 100 cc Cell Saver, 1 unit packed red blood cells    Amber Guthridge C. Donzetta Matters, MD [facility] [place] Office: [phone] Pager: [phone]

## 2019-06-18 NOTE — Anesthesia Postprocedure Evaluation (Signed)
Anesthesia Post Note  Patient: Wendy [name]  Procedure(s) Performed: AORTA BIFEMORAL BYPASS GRAFT (N/A Abdomen)     Patient location during evaluation: PACU Anesthesia Type: General Level of consciousness: sedated and patient cooperative Pain management: pain level controlled Vital Signs Assessment: post-procedure vital signs reviewed and stable Respiratory status: spontaneous breathing Cardiovascular status: stable Anesthetic complications: no    Last Vitals:  Vitals:   06/18/19 1345 06/18/19 1415  BP: (!) 143/60   Pulse: 64 65  Resp: 20 18  Temp: 36.8 C   SpO2: 100% 100%    Last Pain:  Vitals:   06/18/19 1415  TempSrc:   PainSc: [name]

## 2019-06-18 NOTE — H&P (Signed)
   History and Physical Update  The patient was interviewed and re-examined.  The patient's previous History and Physical has been reviewed and is unchanged from recent office tele visit. Plan is for aortobifemoral bypass with possible need for right femoral to popliteal bypass.   Wendy [name] C. Donzetta Matters, MD [facility] [place] Office: [phone] Pager: [phone]   06/18/2019, 7:12 AM

## 2019-06-18 NOTE — Anesthesia Procedure Notes (Signed)
Arterial Line Insertion Start/End4/13/2021 7:05 AM, 06/18/2019 7:20 AM Performed by: Nolon Nations, MD, anesthesiologist  Patient location: Pre-op. Preanesthetic checklist: patient identified, IV checked, site marked, risks and benefits discussed, surgical consent, monitors and equipment checked, pre-op evaluation, timeout performed and anesthesia consent Lidocaine 1% used for infiltration and patient sedated brachial was placed Catheter size: 20 Fr Hand hygiene performed  and maximum sterile barriers used   Attempts: 1 Procedure performed using ultrasound guided technique. Ultrasound Notes:anatomy identified, needle tip was noted to be adjacent to the nerve/plexus identified, no ultrasound evidence of intravascular and/or intraneural injection and image(s) printed for medical record Following insertion, dressing applied, Biopatch and line sutured. Post procedure assessment: normal and unchanged  Patient tolerated the procedure well with no immediate complications.

## 2019-06-18 NOTE — Anesthesia Procedure Notes (Signed)
Central Venous Catheter Insertion Performed by: Nolon Nations, MD, anesthesiologist Start/End4/13/2021 7:35 AM, 06/18/2019 7:40 AM Patient location: Pre-op. Preanesthetic checklist: patient identified, IV checked, site marked, risks and benefits discussed, surgical consent, monitors and equipment checked, pre-op evaluation, timeout performed and anesthesia consent Hand hygiene performed  and maximum sterile barriers used  PA cath was placed.Swan type:thermodilution PA Cath depth:41 Procedure performed without using ultrasound guided technique. Attempts: 1 Patient tolerated the procedure well with no immediate complications.

## 2019-06-19 ENCOUNTER — Other Ambulatory Visit: Payer: Self-pay

## 2019-06-19 ENCOUNTER — Inpatient Hospital Stay (HOSPITAL_COMMUNITY): Payer: Medicare HMO

## 2019-06-19 DIAGNOSIS — I4891 Unspecified atrial fibrillation: Secondary | ICD-10-CM

## 2019-06-19 LAB — POCT I-STAT 7, (LYTES, BLD GAS, ICA,H+H)
Acid-Base Excess: 3 mmol/L — ABNORMAL HIGH (ref 0.0–2.0)
Acid-base deficit: 1 mmol/L (ref 0.0–2.0)
Acid-base deficit: 1 mmol/L (ref 0.0–2.0)
Acid-base deficit: 1 mmol/L (ref 0.0–2.0)
Bicarbonate: 28.8 mmol/L — ABNORMAL HIGH (ref 20.0–28.0)
Bicarbonate: 24.6 mmol/L (ref 20.0–28.0)
Bicarbonate: 23.9 mmol/L (ref 20.0–28.0)
Bicarbonate: 24.4 mmol/L (ref 20.0–28.0)
Calcium, Ion: 1.26 mmol/L (ref 1.15–1.40)
Calcium, Ion: 1.21 mmol/L (ref 1.15–1.40)
Calcium, Ion: 1.15 mmol/L (ref 1.15–1.40)
Calcium, Ion: 1.21 mmol/L (ref 1.15–1.40)
HCT: 27 % — ABNORMAL LOW (ref 36.0–46.0)
HCT: 28 % — ABNORMAL LOW (ref 36.0–46.0)
HCT: 23 % — ABNORMAL LOW (ref 36.0–46.0)
HCT: 28 % — ABNORMAL LOW (ref 36.0–46.0)
Hemoglobin: 9.2 g/dL — ABNORMAL LOW (ref 12.0–15.0)
Hemoglobin: 9.5 g/dL — ABNORMAL LOW (ref 12.0–15.0)
Hemoglobin: 7.8 g/dL — ABNORMAL LOW (ref 12.0–15.0)
Hemoglobin: 9.5 g/dL — ABNORMAL LOW (ref 12.0–15.0)
O2 Saturation: 100 %
O2 Saturation: 99 %
O2 Saturation: 100 %
O2 Saturation: 99 %
Patient temperature: 36.2
Patient temperature: 35.9
Patient temperature: 35.9
Patient temperature: 36.1
Potassium: 3.7 mmol/L (ref 3.5–5.1)
Potassium: 3.9 mmol/L (ref 3.5–5.1)
Potassium: 4 mmol/L (ref 3.5–5.1)
Potassium: 4.4 mmol/L (ref 3.5–5.1)
Sodium: 139 mmol/L (ref 135–145)
Sodium: 138 mmol/L (ref 135–145)
Sodium: 139 mmol/L (ref 135–145)
Sodium: 139 mmol/L (ref 135–145)
TCO2: 30 mmol/L (ref 22–32)
TCO2: 26 mmol/L (ref 22–32)
TCO2: 25 mmol/L (ref 22–32)
TCO2: 26 mmol/L (ref 22–32)
pCO2 arterial: 47.8 mmHg (ref 32.0–48.0)
pCO2 arterial: 43.8 mmHg (ref 32.0–48.0)
pCO2 arterial: 39.9 mmHg (ref 32.0–48.0)
pCO2 arterial: 42.4 mmHg (ref 32.0–48.0)
pH, Arterial: 7.384 (ref 7.350–7.450)
pH, Arterial: 7.352 (ref 7.350–7.450)
pH, Arterial: 7.363 (ref 7.350–7.450)
pH, Arterial: 7.38 (ref 7.350–7.450)
pO2, Arterial: 270 mmHg — ABNORMAL HIGH (ref 83.0–108.0)
pO2, Arterial: 141 mmHg — ABNORMAL HIGH (ref 83.0–108.0)
pO2, Arterial: 142 mmHg — ABNORMAL HIGH (ref 83.0–108.0)
pO2, Arterial: 193 mmHg — ABNORMAL HIGH (ref 83.0–108.0)

## 2019-06-19 LAB — RENAL FUNCTION PANEL
Albumin: 3.2 g/dL — ABNORMAL LOW (ref 3.5–5.0)
Anion gap: 12 (ref 5–15)
BUN: 33 mg/dL — ABNORMAL HIGH (ref 8–23)
CO2: 20 mmol/L — ABNORMAL LOW (ref 22–32)
Calcium: 7.9 mg/dL — ABNORMAL LOW (ref 8.9–10.3)
Chloride: 109 mmol/L (ref 98–111)
Creatinine, Ser: 2.86 mg/dL — ABNORMAL HIGH (ref 0.44–1.00)
GFR calc Af Amer: 19 mL/min — ABNORMAL LOW (ref >60)
GFR calc non Af Amer: 16 mL/min — ABNORMAL LOW (ref >60)
Glucose, Bld: 132 mg/dL — ABNORMAL HIGH (ref 70–99)
Phosphorus: 4.9 mg/dL — ABNORMAL HIGH (ref 2.5–4.6)
Potassium: 4.4 mmol/L (ref 3.5–5.1)
Sodium: 141 mmol/L (ref 135–145)

## 2019-06-19 LAB — URINALYSIS, ROUTINE W REFLEX MICROSCOPIC
Bilirubin Urine: NEGATIVE
Glucose, UA: 50 mg/dL — AB
Ketones, ur: NEGATIVE mg/dL
Leukocytes,Ua: NEGATIVE
Nitrite: NEGATIVE
Protein, ur: 100 mg/dL — AB
Specific Gravity, Urine: 1.009 (ref 1.005–1.030)
pH: 6 (ref 5.0–8.0)

## 2019-06-19 LAB — CBC
HCT: 33.2 % — ABNORMAL LOW (ref 36.0–46.0)
HCT: 27.9 % — ABNORMAL LOW (ref 36.0–46.0)
HCT: 25.5 % — ABNORMAL LOW (ref 36.0–46.0)
Hemoglobin: 10.6 g/dL — ABNORMAL LOW (ref 12.0–15.0)
Hemoglobin: 8.9 g/dL — ABNORMAL LOW (ref 12.0–15.0)
Hemoglobin: 7.9 g/dL — ABNORMAL LOW (ref 12.0–15.0)
MCH: 28.6 pg (ref 26.0–34.0)
MCH: 29.3 pg (ref 26.0–34.0)
MCH: 28.4 pg (ref 26.0–34.0)
MCHC: 31.9 g/dL (ref 30.0–36.0)
MCHC: 31.9 g/dL (ref 30.0–36.0)
MCHC: 31 g/dL (ref 30.0–36.0)
MCV: 89.7 fL (ref 80.0–100.0)
MCV: 91.8 fL (ref 80.0–100.0)
MCV: 91.7 fL (ref 80.0–100.0)
Platelets: 190 10*3/uL (ref 150–400)
Platelets: 158 10*3/uL (ref 150–400)
Platelets: 133 10*3/uL — ABNORMAL LOW (ref 150–400)
RBC: 3.7 MIL/uL — ABNORMAL LOW (ref 3.87–5.11)
RBC: 3.04 MIL/uL — ABNORMAL LOW (ref 3.87–5.11)
RBC: 2.78 MIL/uL — ABNORMAL LOW (ref 3.87–5.11)
RDW: 15.9 % — ABNORMAL HIGH (ref 11.5–15.5)
RDW: 16.3 % — ABNORMAL HIGH (ref 11.5–15.5)
RDW: 16.5 % — ABNORMAL HIGH (ref 11.5–15.5)
WBC: 8.1 10*3/uL (ref 4.0–10.5)
WBC: 5.7 10*3/uL (ref 4.0–10.5)
WBC: 5.6 10*3/uL (ref 4.0–10.5)
nRBC: 0 % (ref 0.0–0.2)
nRBC: 0 % (ref 0.0–0.2)
nRBC: 0 % (ref 0.0–0.2)

## 2019-06-19 LAB — BASIC METABOLIC PANEL
Anion gap: 9 (ref 5–15)
BUN: 33 mg/dL — ABNORMAL HIGH (ref 8–23)
CO2: 21 mmol/L — ABNORMAL LOW (ref 22–32)
Calcium: 7.6 mg/dL — ABNORMAL LOW (ref 8.9–10.3)
Chloride: 110 mmol/L (ref 98–111)
Creatinine, Ser: 2.51 mg/dL — ABNORMAL HIGH (ref 0.44–1.00)
GFR calc Af Amer: 22 mL/min — ABNORMAL LOW (ref >60)
GFR calc non Af Amer: 19 mL/min — ABNORMAL LOW (ref >60)
Glucose, Bld: 143 mg/dL — ABNORMAL HIGH (ref 70–99)
Potassium: 4.2 mmol/L (ref 3.5–5.1)
Sodium: 140 mmol/L (ref 135–145)

## 2019-06-19 LAB — COMPREHENSIVE METABOLIC PANEL
ALT: 13 U/L (ref 0–44)
AST: 20 U/L (ref 15–41)
Albumin: 2.3 g/dL — ABNORMAL LOW (ref 3.5–5.0)
Alkaline Phosphatase: 38 U/L (ref 38–126)
Anion gap: 9 (ref 5–15)
BUN: 31 mg/dL — ABNORMAL HIGH (ref 8–23)
CO2: 23 mmol/L (ref 22–32)
Calcium: 7.9 mg/dL — ABNORMAL LOW (ref 8.9–10.3)
Chloride: 106 mmol/L (ref 98–111)
Creatinine, Ser: 2.28 mg/dL — ABNORMAL HIGH (ref 0.44–1.00)
GFR calc Af Amer: 25 mL/min — ABNORMAL LOW (ref >60)
GFR calc non Af Amer: 22 mL/min — ABNORMAL LOW (ref >60)
Glucose, Bld: 150 mg/dL — ABNORMAL HIGH (ref 70–99)
Potassium: 4.2 mmol/L (ref 3.5–5.1)
Sodium: 138 mmol/L (ref 135–145)
Total Bilirubin: 0.5 mg/dL (ref 0.3–1.2)
Total Protein: 5 g/dL — ABNORMAL LOW (ref 6.5–8.1)

## 2019-06-19 LAB — CREATININE, URINE, RANDOM: Creatinine, Urine: 33.74 mg/dL

## 2019-06-19 LAB — BRAIN NATRIURETIC PEPTIDE: B Natriuretic Peptide: 366.7 pg/mL — ABNORMAL HIGH (ref 0.0–100.0)

## 2019-06-19 LAB — AMYLASE: Amylase: 61 U/L (ref 28–100)

## 2019-06-19 LAB — MAGNESIUM: Magnesium: 2.3 mg/dL (ref 1.7–2.4)

## 2019-06-19 LAB — TROPONIN I (HIGH SENSITIVITY)
Troponin I (High Sensitivity): 26 ng/L — ABNORMAL HIGH (ref <18)
Troponin I (High Sensitivity): 61 ng/L — ABNORMAL HIGH (ref <18)
Troponin I (High Sensitivity): 165 ng/L (ref <18)

## 2019-06-19 LAB — GLUCOSE, CAPILLARY: Glucose-Capillary: 106 mg/dL — ABNORMAL HIGH (ref 70–99)

## 2019-06-19 LAB — SODIUM, URINE, RANDOM: Sodium, Ur: 90 mmol/L

## 2019-06-19 MED ORDER — PANTOPRAZOLE SODIUM 40 MG IV SOLR
40.0000 mg | Freq: Every day | INTRAVENOUS | Status: DC
  Administered 2019-06-19 – 2019-06-23 (×5): 40 mg via INTRAVENOUS
  Filled 2019-06-19 (×5): qty 40

## 2019-06-19 MED ORDER — ALBUMIN HUMAN 25 % IV SOLN
50.0000 g | Freq: Once | INTRAVENOUS | Status: AC
  Administered 2019-06-19: 50 g via INTRAVENOUS
  Filled 2019-06-19: qty 200

## 2019-06-19 MED ORDER — SODIUM CHLORIDE 0.9% FLUSH
10.0000 mL | Freq: Two times a day (BID) | INTRAVENOUS | Status: DC
  Administered 2019-06-19 – 2019-06-21 (×3): 10 mL
  Administered 2019-06-22: 40 mL
  Administered 2019-06-22 – 2019-06-27 (×7): 10 mL

## 2019-06-19 MED ORDER — METOPROLOL TARTRATE 5 MG/5ML IV SOLN: 2.5000 mg | Freq: Once | INTRAVENOUS | Status: DC

## 2019-06-19 MED ORDER — SODIUM CHLORIDE 0.9% FLUSH: 10.0000 mL | INTRAVENOUS | Status: DC | PRN

## 2019-06-19 MED ORDER — DOPAMINE-DEXTROSE 3.2-5 MG/ML-% IV SOLN
INTRAVENOUS | Status: AC
  Administered 2019-06-19: 5 ug/kg/min via INTRAVENOUS
  Filled 2019-06-19: qty 250

## 2019-06-19 MED ORDER — NOREPINEPHRINE 4 MG/250ML-% IV SOLN
0.0000 ug/min | INTRAVENOUS | Status: DC
  Filled 2019-06-19: qty 250

## 2019-06-19 MED ORDER — ALBUMIN HUMAN 25 % IV SOLN
25.0000 g | Freq: Once | INTRAVENOUS | Status: AC
  Administered 2019-06-19: 25 g via INTRAVENOUS

## 2019-06-19 MED ORDER — MIDODRINE HCL 5 MG PO TABS: 10.0000 mg | ORAL_TABLET | Freq: Three times a day (TID) | ORAL | Status: DC

## 2019-06-19 MED ORDER — METOPROLOL TARTRATE 5 MG/5ML IV SOLN: 2.5000 mg | Freq: Four times a day (QID) | INTRAVENOUS | Status: DC

## 2019-06-19 MED ORDER — SODIUM CHLORIDE 0.9 % IV BOLUS
1000.0000 mL | Freq: Once | INTRAVENOUS | Status: AC
  Administered 2019-06-19: 1000 mL via INTRAVENOUS

## 2019-06-19 MED ORDER — DOPAMINE-DEXTROSE 3.2-5 MG/ML-% IV SOLN: 2.0000 ug/kg/min | INTRAVENOUS | Status: DC

## 2019-06-19 MED ORDER — ALBUMIN HUMAN 5 % IV SOLN
INTRAVENOUS | Status: AC
  Administered 2019-06-19: 12.5 g
  Filled 2019-06-19: qty 250

## 2019-06-19 MED ORDER — LABETALOL HCL 5 MG/ML IV SOLN
10.0000 mg | Freq: Once | INTRAVENOUS | Status: AC
  Administered 2019-06-19: 10 mg via INTRAVENOUS
  Filled 2019-06-19: qty 4

## 2019-06-19 MED ORDER — METOPROLOL TARTRATE 5 MG/5ML IV SOLN: 2.5000 mg | Freq: Three times a day (TID) | INTRAVENOUS | Status: DC

## 2019-06-19 NOTE — Progress Notes (Signed)
OT Cancellation Note  Patient Details Name: Wendy [name] MRN: [id number] DOB: Oct 02, 1950   Cancelled Treatment:    Reason Eval/Treat Not Completed: Medical issues which prohibited therapy(Pt having chest pain; RN hold at this time.)  OT to follow for next available treatment day for OT eval.  Jefferey Pica, OTR/L Acute Rehabilitation Services Pager: [phone] Office: [phone]   Tahj Njoku C 06/19/2019, 2:36 PM

## 2019-06-19 NOTE — Addendum Note (Signed)
Addendum  created 06/19/19 1152 by Josephine Igo, CRNA   Order list changed

## 2019-06-19 NOTE — Progress Notes (Signed)
[facility] by RN with patient reporting chest pain, diaphorectic and tachy HR 118.    Objective (!) 96/40 95 (!) 96.5 F (35.8 C) (Axillary) 20 94%  Intake/Output Summary (Last 24 hours) at 06/19/2019 1259 Last data filed at 06/19/2019 1256 Gross per 24 hour  Intake 2514.93 ml  Output 1845 ml  Net 669.93 ml    Chest pain resolved, moving all extremities, following commands. Palpable radial and pedal pulses B Abdomin soft, tender near incision Groins soft without hematoma.  Assessment/Planning: POD # 1  Procedure Performed: 1.Reexploration bilateral common femoral arteries greater than 30 days 2.Aortobifemoral bypass with 14 x 7 mm Hemashield 3.Reimplantation of right profunda femoris artery on aortobifemoral limb   Hypotensive stopped Dopamine and started Levophed.  Labs ordered Troponin's, CBC BMET, and called cardiology consult for further work up.    First ECG leads were reversed and second ECG did not reveal STMI.    Roxy Horseman 06/19/2019 12:59 PM --  Laboratory Lab Results: Recent Labs    06/18/19 1240 06/19/19 0244  WBC 6.7 8.1  HGB 10.5* 10.6*  HCT 33.5* 33.2*  PLT 188 190   BMET Recent Labs    06/18/19 1240 06/19/19 0244  NA 139 138  K 4.4 4.2  CL 109 106  CO2 22 23  GLUCOSE 196* 150*  BUN 25* 31*  CREATININE 1.40* 2.28*  CALCIUM 8.0* 7.9*    COAG Lab Results  Component Value Date   INR 1.4 (H) 06/18/2019   INR 1.6 (H) 06/14/2019   INR 1.1 (A) 10/17/2017   No results found for: PTT

## 2019-06-19 NOTE — Progress Notes (Signed)
Patient went into AFIB with RVR with HR in the 120's with BP's in the 90/50 range.  She has been having chest pain since this afternoon.  I gave her labetolol with minimal response.  EKG showed possible  Ischemia.  Repeat labs were ordered.  She states that her pain in her chest is slightly improved.  I have spoken with cardiology who will consult on the patient.  Wendy [name]

## 2019-06-19 NOTE — Progress Notes (Signed)
PT Cancellation Note  Patient Details Name: Wendy [name] MRN: [id number] DOB: 04/14/1950   Cancelled Treatment:    Reason Eval/Treat Not Completed: Medical issues which prohibited therapy. Pt with chest pain this PM. Will defer today and recheck tomorrow.   Shary Decamp [facility] 06/19/2019, 2:52 PM [name] [place] Pager [phone] Office [phone]

## 2019-06-19 NOTE — Progress Notes (Addendum)
[facility] [place]  Subjective  - A & O x 3 pain at incision sites.   Objective (!) 90/38 73 (!) 97.5 F (36.4 C) (Axillary) 17 99%  Intake/Output Summary (Last 24 hours) at 06/19/2019 1102 Last data filed at 06/19/2019 1050 Gross per 24 hour  Intake 5779.93 ml  Output 1545 ml  Net 4234.93 ml    Palpable pedal pulses Abdomin is soft, groins soft without hematoma Foley to gravity UOP 100 ml since 0700 NG OP 1,800 greenish  Assessment/Planning: POD#1 Procedure Performed: 1.  Reexploration bilateral common femoral arteries greater than 30 days 2.  Aortobifemoral bypass with 14 x 7 mm Hemashield 3.  Reimplantation of right profunda femoris artery on aortobifemoral limb  Hypotensive She received a bolus 500 ml, albumin, HGB 10.6  No evidence of active blood loss We will start support with Dopamine titrate.   Stable disposition otherwise.  Roxy Horseman 06/19/2019 11:02 AM --  Laboratory Lab Results: Recent Labs    06/18/19 1240 06/19/19 0244  WBC 6.7 8.1  HGB 10.5* 10.6*  HCT 33.5* 33.2*  PLT 188 190   BMET Recent Labs    06/18/19 1240 06/19/19 0244  NA 139 138  K 4.4 4.2  CL 109 106  CO2 22 23  GLUCOSE 196* 150*  BUN 25* 31*  CREATININE 1.40* 2.28*  CALCIUM 8.0* 7.9*    COAG Lab Results  Component Value Date   INR 1.4 (H) 06/18/2019   INR 1.6 (H) 06/14/2019   INR 1.1 (A) 10/17/2017   No results found for: PTT

## 2019-06-19 NOTE — Consult Note (Addendum)
Reason for Consult: Acute kidney injury Referring Physician: Servando Snare, MD (vascular surgery)  HPI:  [name] with past medical history significant for coronary artery disease status post CABG, paroxysmal atrial fibrillation, hypertension, dyslipidemia, chronic obstructive lung disease and peripheral vascular disease for which he underwent aortofemoral bypass yesterday with reimplantation of the right profunda femoris artery on aortobifemoral limb.  She had an infrarenal clamp without any significant amount of documented intraoperative blood loss.  Earlier this morning, noted to have hypotension with significant reduction of urine output and evidence of acute kidney injury.  Baseline creatinine appears to be around 0.9-1.1 and she recently had a coronary angiogram by Dr. Angelena Form on 3/31 with creatinine on 06/14/2019 of 1.2.  She denies any prior history of acute kidney injury, history of recurrent nephrolithiasis or history of recurrent urinary tract infections.  She denies any regular use of nonsteroidal anti-inflammatory drugs.  She reports some transient hematuria prior to presentation in the hospital without any associated dysuria, flank pain, fever or chills.  A urinalysis from 06/14/2019 showed no RBCs with positive nitrites and trace leukocytes.  Earlier this morning she was started on dopamine and at that time developed some transient chest tightness/chest pain.  Past Medical History:  Diagnosis Date  . Anemia   . Anginal pain ([place])    jaw pain 07/2016, s/p 07/15/16 nuclear stress test  . Anxiety   . Arthritis    "lower back" (08/24/2017)  . Asthma   . CAD (coronary artery disease), native coronary artery    a. Hx CABG 1998, last PCI 2013 b.LHC 07/2013:  EF 55-60%, L-LAD prob atretic, no sig disease in LAD, S-OM1 ok with patent stent, S-dRCA ok => med Rx. c. LHC 01/2015 DES to SVG-RCA.  Marland Kitchen Chronic back pain   . Chronic leg pain   . Chronic lower back pain   . COPD  (chronic obstructive pulmonary disease) ([place])   . Depression   . DVT (deep venous thrombosis) ([facility]) X 3   RLE  . Fall 08/2016  . GERD (gastroesophageal reflux disease)   . Glucose intolerance (impaired glucose tolerance)   . Headache    "a few/month" (08/24/2017)  . HLD (hyperlipidemia)   . Hypertension   . Myocardial infarction ([place]) 1999  . OSA on CPAP   . PAD (peripheral artery disease) ([employer])   . PAF (paroxysmal atrial fibrillation) ([place])    a. 09/2013 post-op b. 01/2015  . Peripheral neuropathy   . Pneumonia   . Shortness of breath    when walking  . Wears glasses     Past Surgical History:  Procedure Laterality Date  . ABDOMINAL AORTOGRAM W/LOWER EXTREMITY Bilateral 04/29/2019   Procedure: ABDOMINAL AORTOGRAM W/LOWER EXTREMITY;  Surgeon: Waynetta Sandy, MD;  Location: [facility];  Service: Cardiovascular;  Laterality: Bilateral;  . ANTERIOR CERVICAL DECOMP/DISCECTOMY FUSION  1991  . AORTA - BILATERAL FEMORAL ARTERY BYPASS GRAFT N/A 06/18/2019   Procedure: AORTA BIFEMORAL BYPASS GRAFT;  Surgeon: Waynetta Sandy, MD;  Location: [facility];  Service: Vascular;  Laterality: N/A;  . BACK SURGERY    . CARDIAC CATHETERIZATION N/A 01/22/2015   Procedure: Left Heart Cath and Coronary Angiography;  Surgeon: Troy Sine, MD;  Location: [facility];  Service: Cardiovascular;  Laterality: N/A;  . CARDIAC CATHETERIZATION N/A 01/22/2015   Procedure: Coronary Stent Intervention;  Surgeon: Troy Sine, MD;  Location: [facility];  Service: Cardiovascular;  Laterality: N/A;  3.0x12 xience prox SVG  to RCA  . CARDIAC CATHETERIZATION  03/09/2000   hx/notes 03/20/2000  . CHOLECYSTECTOMY N/A 09/16/2013   Procedure: LAPAROSCOPIC CHOLECYSTECTOMY CONVERTED TO OPEN CHOLECYSTECTOMY WITH CHOLANGIOGRAM;  Surgeon: [name], MD;  Location: [facility];  Service: General;  Laterality: N/A;  . CORONARY ANGIOPLASTY WITH STENT PLACEMENT  11/2011, 02/2012, 01/2015    DES to SVG-RCA both times, In [place], [place], Dr. Bruce Donath  . CORONARY ARTERY BYPASS GRAFT  1998   LIMA-LAD, SVG-OM, SVG-RCA  . ERCP N/A 09/19/2013   Procedure: ENDOSCOPIC RETROGRADE CHOLANGIOPANCREATOGRAPHY (ERCP);  Surgeon: [name], MD;  Location: [facility];  Service: Endoscopy;  Laterality: N/A;  . FINGER SURGERY Right    ring finger "fused"  . FRACTURE SURGERY    . IRRIGATION AND DEBRIDEMENT ABSCESS Right 10/17/2013   Procedure: INCISION AND DRAINAGE RIGHT SUBCOSTAL WOUND;  Surgeon: [name], MD;  Location: [facility];  Service: General;  Laterality: Right;  . JOINT REPLACEMENT    . KNEE ARTHROSCOPY Right 1998  . LEFT HEART CATH AND CORS/GRAFTS ANGIOGRAPHY N/A 07/25/2016   Procedure: Left Heart Cath and Cors/Grafts Angiography;  Surgeon: Jettie Booze, MD;  Location: [facility];  Service: Cardiovascular;  Laterality: N/A;  . LEFT HEART CATH AND CORS/GRAFTS ANGIOGRAPHY N/A 06/05/2019   Procedure: LEFT HEART CATH AND CORS/GRAFTS ANGIOGRAPHY;  Surgeon: Burnell Blanks, MD;  Location: [facility];  Service: Cardiovascular;  Laterality: N/A;  . LEFT HEART CATHETERIZATION WITH CORONARY/GRAFT ANGIOGRAM N/A 07/19/2013   Procedure: LEFT HEART CATHETERIZATION WITH Beatrix Fetters;  Surgeon: Leonie Man, MD;  Location: [facility];  Service: Cardiovascular;  Laterality: N/A;  . MASS EXCISION Right 06/25/2014   Procedure: EXCISION BUTTOCK MASS ;  Surgeon: Coralie Keens, MD;  Location: [facility];  Service: General;  Laterality: Right;  . ORIF HUMERUS FRACTURE Left 08/27/2013   Procedure: OPEN REDUCTION INTERNAL FIXATION (ORIF) LEFT HUMERUS ;  Surgeon: Rozanna Box, MD;  Location: [facility];  Service: Orthopedics;  Laterality: Left;  . POSTERIOR FUSION CERVICAL SPINE  1992  . THROMBECTOMY / EMBOLECTOMY FEMORAL ARTERY Right    hx/notes 03/20/2000  . TOTAL HIP ARTHROPLASTY Right 07/19/2016   Procedure: TOTAL HIP ARTHROPLASTY ANTERIOR  APPROACH;  Surgeon: [name], MD;  Location: [facility];  Service: Orthopedics;  Laterality: Right;  . TOTAL HIP REVISION Right 08/22/2017   Procedure: TOTAL HIP REVISION;  Surgeon: [name], MD;  Location: [facility];  Service: Orthopedics;  Laterality: Right;    Family History  Problem Relation Age of Onset  . Lung cancer Mother   . Clotting disorder Father   . Lung cancer Sister   . Heart disease Brother   . Heart disease Brother   . Colon cancer Neg Hx   . Breast cancer Neg Hx     Social History:  reports that she quit smoking about 20 years ago. Her smoking use included cigarettes. She has a 17.00 pack-year smoking history. She has never used smokeless tobacco. She reports previous alcohol use. She reports current drug use. Drug: Marijuana.  Allergies:  Allergies  Allergen Reactions  . Benadryl [Diphenhydramine] Shortness Of Breath  . Penicillins Diarrhea    Did it involve swelling of the face/tongue/throat, SOB, or low BP? No Did it involve sudden or severe rash/hives, skin peeling, or any reaction on the inside of your mouth or nose? No Did you need to seek medical attention at a hospital or doctor's office? No When did it last happen?2  months If all above answers are "NO", may proceed with cephalosporin use.  . Adhesive [Tape] Other (See Comments)    Burning of skin  . Amoxicillin Diarrhea and Other (See Comments)    Has patient had a PCN reaction causing immediate rash, facial/tongue/throat swelling, SOB or lightheadedness with hypotension: No Has patient had a PCN reaction causing severe rash involving mucus membranes or skin necrosis: No Has patient had a PCN reaction that required hospitalization No Has patient had a PCN reaction occurring within the last 10 years: Yes -- noted rxn as diarrhea If all of the above answers are "NO", then may proceed with Cephalosporin use.   . Cyclobenzaprine Other (See Comments)    Causes restless leg syndrome Restless  syndrome  . Hydrocodone Itching    Medications:  Scheduled: . Chlorhexidine Gluconate Cloth  6 each Topical Daily  . Chlorhexidine Gluconate Cloth  6 each Topical Q0600  . mouth rinse  15 mL Mouth Rinse BID  . morphine   Intravenous Q4H  . mupirocin ointment  1 application Nasal BID  . pantoprazole (PROTONIX) IV  40 mg Intravenous Daily  . sodium chloride flush  10-40 mL Intracatheter Q12H    BMP Latest Ref Rng & Units 06/19/2019 06/18/2019 06/18/2019  Glucose 70 - 99 mg/dL 150(H) 196(H) -  BUN 8 - 23 mg/dL 31(H) 25(H) -  Creatinine 0.44 - 1.00 mg/dL 2.28(H) 1.40(H) -  BUN/Creat Ratio 12 - 28 - - -  Sodium 135 - 145 mmol/L 138 139 139  Potassium 3.5 - 5.1 mmol/L 4.2 4.4 4.4  Chloride 98 - 111 mmol/L 106 109 -  CO2 22 - 32 mmol/L 23 22 -  Calcium 8.9 - 10.3 mg/dL 7.9(L) 8.0(L) -   CBC Latest Ref Rng & Units 06/19/2019 06/18/2019 06/18/2019  WBC 4.0 - 10.5 K/uL 8.1 6.7 -  Hemoglobin 12.0 - 15.0 g/dL 10.6(L) 10.5(L) 9.5(L)  Hematocrit 36.0 - 46.0 % 33.2(L) 33.5(L) 28.0(L)  Platelets 150 - 400 K/uL 190 188 -     DG Chest Port 1 View  Result Date: 06/19/2019 CLINICAL DATA:  Chest pain.  Recent aortobifemoral bypass graft EXAM: PORTABLE CHEST 1 VIEW COMPARISON:  June 18, 2019 FINDINGS: Swan-Ganz catheter has been removed with Cordis tip noted in the superior vena cava. Nasogastric tube tip and side port are below the diaphragm. No pneumothorax. There is minimal atelectasis in the right lower lobe. Lungs elsewhere are clear. Heart size and pulmonary vascular normal. Patient is status post coronary artery bypass grafting. Postoperative changes noted in the proximal left humerus. IMPRESSION: Tube and catheter positions as described without pneumothorax. Slight right lower lobe atelectasis. Lungs elsewhere clear. Stable cardiac silhouette. Electronically Signed   By: Lowella Grip III M.D.   On: 06/19/2019 09:24   DG Chest Port 1 View  Result Date: 06/18/2019 CLINICAL DATA:   Postoperative aortobifemoral bypass EXAM: PORTABLE CHEST 1 VIEW COMPARISON:  Most recent radiograph 09/13/2018 FINDINGS: A right IJ catheter sheath is positioned through which passes a Swan-Ganz catheter, the tip of which is approximating the pulmonary trunk. A transesophageal tube tip courses below the level of imaging, better seen on concurrent abdominal radiograph. Postsurgical changes related to prior CABG including intact and aligned sternotomy wires and multiple surgical clips projecting over the mediastinum. Coronary stent graft noted along the right heart border. Prior left humeral plate and screw fixation. Telemetry leads overlie the chest. Lung volumes are low with some streaky areas of opacity favoring atelectasis. No new consolidative opacity. No pneumothorax  or visible effusion. No acute osseous or soft tissue abnormality. IMPRESSION: 1. Support lines and tubes as above. 2. Low lung volumes with some streaky areas of opacity favoring atelectasis. No new consolidative opacity or effusion. Electronically Signed   By: Lovena Le M.D.   On: 06/18/2019 14:59   DG Abd Portable 1V  Result Date: 06/18/2019 CLINICAL DATA:  Postop aortobifemoral bypass EXAM: PORTABLE ABDOMEN - 1 VIEW COMPARISON:  CT abdomen pelvis May 14, 2014 FINDINGS: Transesophageal tube tip terminates in the midline abdomen with the side port distal to the expected location of the GE junction. Bowel gas pattern is nonspecific though does not appear frankly obstructive at this time. Surgical clips are noted in the right upper quadrant, midline abdomen and about the iliac regions bilaterally. Degenerative changes noted in the spine, pelvis, and left hip. Total right hip arthroplasty is noted and is in expected position. IMPRESSION: 1. Transesophageal tube tip terminates in the midline abdomen with the side port distal to the expected location of the GE junction. No high-grade obstructive bowel gas pattern. Electronically Signed   By:  Lovena Le M.D.   On: 06/18/2019 14:57   ECHOCARDIOGRAM COMPLETE  Result Date: 06/17/2019    ECHOCARDIOGRAM REPORT   Patient Name:   Wendy [name] Date of Exam: 06/17/2019 Medical Rec #:  [id number]        Height:       61.0 in Accession #:    [id number]       Weight:       144.4 lb Date of Birth:  02/12/1951        BSA:          1.645 m Patient Age:    31 years         BP:           110/62 mmHg Patient Gender: F                HR:           61 bpm. Exam Location:  [facility] Procedure: 2D Echo, Cardiac Doppler and Color Doppler Indications:    I25.708  History:        Patient has prior history of Echocardiogram examinations, most                 recent 07/23/2016. CHF, CAD, COPD, Arrythmias:Atrial                 Fibrillation; Risk Factors:Hypertension and Dyslipidemia.  Sonographer:    Coralyn Helling RDCS Referring Phys: [place]  1. Left ventricular ejection fraction, by estimation, is 55 to 60%. The left ventricle has normal function. The left ventricle demonstrates regional wall motion abnormalities (see scoring diagram/findings for description). Left ventricular diastolic parameters were normal.  2. Right ventricular systolic function is low normal. The right ventricular size is normal. There is moderately elevated pulmonary artery systolic pressure.  3. The mitral valve is normal in structure. Mild mitral valve regurgitation.  4. The aortic valve is normal in structure. Aortic valve regurgitation is not visualized.  5. The inferior vena cava is normal in size with greater than 50% respiratory variability, suggesting right atrial pressure of 3 mmHg. FINDINGS  Left Ventricle: Left ventricular ejection fraction, by estimation, is 55 to 60%. The left ventricle has normal function. The left ventricle demonstrates regional wall motion abnormalities. Moderate hypokinesis of the left ventricular, basal inferior wall. The left ventricular internal cavity size was normal in size.  There  is no left ventricular hypertrophy. Left ventricular diastolic parameters were normal. Right Ventricle: The right ventricular size is normal. No increase in right ventricular wall thickness. Right ventricular systolic function is low normal. There is moderately elevated pulmonary artery systolic pressure. The tricuspid regurgitant velocity  is 3.04 m/s, and with an assumed right atrial pressure of 10 mmHg, the estimated right ventricular systolic pressure is [id number] mmHg. Left Atrium: Left atrial size was normal in size. Right Atrium: Right atrial size was normal in size. Pericardium: There is no evidence of pericardial effusion. Mitral Valve: The mitral valve is normal in structure. Mild mitral valve regurgitation. Tricuspid Valve: The tricuspid valve is normal in structure. Tricuspid valve regurgitation is mild. Aortic Valve: The aortic valve is normal in structure. Aortic valve regurgitation is not visualized. Pulmonic Valve: The pulmonic valve was normal in structure. Pulmonic valve regurgitation is not visualized. Aorta: The aortic root is normal in size and structure. Venous: The inferior vena cava is normal in size with greater than 50% respiratory variability, suggesting right atrial pressure of 3 mmHg. IAS/Shunts: No atrial level shunt detected by color flow Doppler.  LEFT VENTRICLE PLAX 2D LVIDd:         4.40 cm  Diastology LVIDs:         3.00 cm  LV e' lateral:   10.60 cm/s LV PW:         1.10 cm  LV E/e' lateral: 10.8 LV IVS:        1.00 cm  LV e' medial:    5.66 cm/s LVOT diam:     1.80 cm  LV E/e' medial:  20.1 LV SV:         69 LV SV Index:   42 LVOT Area:     2.54 cm  RIGHT VENTRICLE             IVC RV S prime:     10.30 cm/s  IVC diam: 1.30 cm TAPSE (M-mode): 1.5 cm RVSP:           40.0 mmHg LEFT ATRIUM             Index       RIGHT ATRIUM           Index LA diam:        3.20 cm 1.95 cm/m  RA Pressure: 3.00 mmHg LA Vol (A2C):   37.8 ml 22.98 ml/m RA Area:     16.90 cm LA Vol (A4C):   39.0 ml 23.71  ml/m RA Volume:   46.90 ml  28.52 ml/m LA Biplane Vol: 41.8 ml 25.42 ml/m  AORTIC VALVE LVOT Vmax:   123.00 cm/s LVOT Vmean:  84.700 cm/s LVOT VTI:    0.272 m  AORTA Ao Root diam: 3.00 cm Ao Asc diam:  3.20 cm MV E velocity: 114.00 cm/s  TRICUSPID VALVE MV A velocity: 111.00 cm/s  TR Peak grad:   37.0 mmHg MV E/A ratio:  1.03         TR Vmax:        304.00 cm/s                             Estimated RAP:  3.00 mmHg                             RVSP:  40.0 mmHg                              SHUNTS                             Systemic VTI:  0.27 m                             Systemic Diam: 1.80 cm Dorris Carnes MD Electronically signed by Dorris Carnes MD Signature Date/Time: 06/17/2019/8:11:52 PM    Final     Review of Systems  Constitutional: Negative for chills, fatigue and fever.  HENT: Negative for ear pain, nosebleeds and sore throat.   Eyes: Negative for photophobia and redness.  Respiratory: Positive for chest tightness. Negative for cough, shortness of breath and wheezing.   Cardiovascular: Positive for chest pain.  Gastrointestinal: Negative for abdominal distention, abdominal pain, diarrhea, nausea and vomiting.  Endocrine: Negative.   Genitourinary: Positive for hematuria. Negative for dysuria.  Musculoskeletal: Positive for back pain.   Blood pressure (!) 96/40, pulse 95, temperature (!) 96.5 F (35.8 C), temperature source Axillary, resp. rate 16, SpO2 97 %. Physical Exam  Nursing note and vitals reviewed. Constitutional: She appears well-developed and well-nourished.  Appears uncomfortable  HENT:  Head: Normocephalic and atraumatic.  Dry oropharynx with NG tube in situ  Eyes: Pupils are equal, round, and reactive to light. Conjunctivae are normal. No scleral icterus.  Neck: No JVD present.  Cardiovascular: Normal rate, regular rhythm and normal heart sounds.  Respiratory: Effort normal and breath sounds normal. She has no wheezes. She has no rales.  GI: Soft. Bowel sounds  are normal. There is no abdominal tenderness. There is no rebound.  Musculoskeletal:        General: No edema.     Cervical back: Normal range of motion and neck supple.  Neurological: She is alert.  Skin: Skin is warm and dry. No rash noted. No erythema.    Assessment/Plan: 1.  Acute kidney injury: I suspect that this is hemodynamically mediated with relative hypotension possibly with evolution to ATN.  I will send off for urine electrolytes and obtain a renal ultrasound.  Management at this time will be supportive by efforts at improving blood pressure/mean arterial pressure to improve renal perfusion.  She has been started on pressors and I will give fluids (sparingly with albumin).  She does not have any acute electrolyte abnormalities or acute indications for dialysis however, will continue to follow her closely to address any.  Repeat labs pending from today.  The rapid development of acute kidney injury does not favor presence of atheroembolic renal disease however, I will check complement levels. 2.  Status post aortobifemoral bypass postoperative day #1. 3.  Hypotension: Likely multifactorial from ongoing narcotic analgesia and residual effects of anesthesia, will continue to follow with supportive management. 4.  Anemia: Slight hemoglobin/hematocrit drop noted, will continue to follow trends to decide on need for PRBC transfusion.  Gabriellia Rempel K. 06/19/2019, 12:47 PM

## 2019-06-19 NOTE — Progress Notes (Signed)
Patient continues to use morphine PCA regularly. Noted to have consistently low BP with MAP mid 50's. Dr. Donzetta Matters notified. New orders received for titratable dopamine drip; will hopefully increase low UOP also. Patient otherwise stable.   Joellen Jersey, RN

## 2019-06-19 NOTE — Progress Notes (Addendum)
Progress Note    06/19/2019 7:08 AM 1 Day Post-Op  Subjective:  Easily awakens. Says morphine via PCA "wears off". Complaining of LLQ pain. Denies CP or SOB.  Says she is passing flatus. Attendant RN states hypotension this AM and given orders for albumin.   Vitals:   06/19/19 0430 06/19/19 0500  BP: (!) 113/47 (!) 110/49  Pulse: 70 71  Resp: 16 18  Temp:    SpO2: 100% 100%   sys BP 110-130s; HR 60-70s  Physical Exam: General: appears comfortable. Awakens easily in NAD. Alert and answers questions appropriately. Cardiac:  RRR Lungs:  CTA bil Incisions:  Midline and bilateral groin incisions well approximated. No hematoma. Extremities:  AROM and intact senation of both feet. 2+ left palp DP pulse. + Doppler DP and PT pulses on right. Abdomen:  Soft, ND, occasional BS  CBC    Component Value Date/Time   WBC 8.1 06/19/2019 0244   RBC 3.70 (L) 06/19/2019 0244   HGB 10.6 (L) 06/19/2019 0244   HGB 13.4 06/04/2019 1036   HCT 33.2 (L) 06/19/2019 0244   HCT 41.7 06/04/2019 1036   PLT 190 06/19/2019 0244   PLT 205 06/04/2019 1036   MCV 89.7 06/19/2019 0244   MCV 86 06/04/2019 1036   MCH 28.6 06/19/2019 0244   MCHC 31.9 06/19/2019 0244   RDW 15.9 (H) 06/19/2019 0244   RDW 17.4 (H) 06/04/2019 1036   LYMPHSABS 1.0 09/07/2017 1800   MONOABS 0.4 09/07/2017 1800   EOSABS 0.1 09/07/2017 1800   BASOSABS 0.1 09/07/2017 1800    BMET    Component Value Date/Time   NA 138 06/19/2019 0244   NA 141 06/04/2019 1036   K 4.2 06/19/2019 0244   CL 106 06/19/2019 0244   CO2 23 06/19/2019 0244   GLUCOSE 150 (H) 06/19/2019 0244   BUN 31 (H) 06/19/2019 0244   BUN 14 06/04/2019 1036   CREATININE 2.28 (H) 06/19/2019 0244   CREATININE 0.96 08/13/2015 0939   CALCIUM 7.9 (L) 06/19/2019 0244   GFRNONAA 22 (L) 06/19/2019 0244   GFRAA 25 (L) 06/19/2019 0244     Intake/Output Summary (Last 24 hours) at 06/19/2019 0708 Last data filed at 06/19/2019 0400 Gross per 24 hour  Intake  4606.31 ml  Output 2455 ml  Net 2151.31 ml   UOP: 705cc  NGT outpuit: 1300cc   HOSPITAL MEDICATIONS Scheduled Meds: . Chlorhexidine Gluconate Cloth  6 each Topical Daily  . Chlorhexidine Gluconate Cloth  6 each Topical Q0600  . mouth rinse  15 mL Mouth Rinse BID  . morphine   Intravenous Q4H  . mupirocin ointment  1 application Nasal BID  . pantoprazole  40 mg Oral Daily  . sodium chloride flush  10-40 mL Intracatheter Q12H   Continuous Infusions: . sodium chloride 100 mL/hr at 06/19/19 0648  . albumin human     PRN Meds:.acetaminophen **OR** acetaminophen, alum & mag hydroxide-simeth, diphenhydrAMINE **OR** diphenhydrAMINE, guaiFENesin-dextromethorphan, hydrALAZINE, labetalol, metoprolol tartrate, morphine injection, naloxone **AND** sodium chloride flush, ondansetron, phenol, potassium chloride, sodium chloride flush  Assessment and Plan::  69 y.o. female is s/p: aortobifemoral bypass. Hypotensive this morning: albumin just given. Give bolus 1L NS now and continue IVFs at 100cc/hr.  Elevated creatinine 1.40>>2.28. Decreased UOP overnight. Foley non-obstructed. Urine clear.  Consult nephrology.  Significant NGT output: continue NGT  Acute blood loss anemia: hgb improved after one unit PRBCs 7.8>>10.6  Assessment discussed with Dr. Donzetta Matters and updated RN.  1 Day Post-Op  -DVT prophylaxis: SCDs  Risa Grill, PA-C [facility] [phone] 06/19/2019  7:08 AM   I have independently interviewed and examined patient and agree with PA assessment and plan above. Palpable pedal pulses. bolused another liter with minimal uop or bp response. Will initiate dopamine infusion. oob to chair today. Keep ng tube given difficulty in placing. Dry dressings bilateral groin incisions. Appreciate nephrology assistance.   Evan Mackie C. Donzetta Matters, MD [facility] Office: [phone] Pager: [phone]

## 2019-06-19 NOTE — Progress Notes (Signed)
Late Entry  Patient started on dopamine drip around 11am due to low BP. At approx 1220 patient rang call bell. When RN entered room  patient's HR was 115-120, patient was diaphoretic and c/o mid sternal chest pain. EKG obtained and showed "Acute MI" but it was at this time the RN realized the LA/RA leads were reversed. Upon correcting this error, the EKG appeared the same as an EKG from earlier this morning. Dr. Donzetta Matters notified. Vascular PA at bedside to evaluate patient. Dopamine stopped and levophed started for BP control. HR improved. Morphine PCA encouraged - patient stated some relief of chest pain. Dr. Gwenlyn Found at bedside and evaluated EKGs. Multiple labs sent per orders. Will continue to closely monitor.  Joellen Jersey, RN

## 2019-06-19 NOTE — Consult Note (Signed)
Cardiology Consult Note  Reason for consult: chest pain, atrial fibrillation  HPI Wendy [name] is a 69 y.o. female with with CAD s/p CABG and PCI, PAF, HTN, COPD, PAD who underwent aorto bifemoral bipass yesterday.  Her course has been complicated by AKI (Cr 2.5 up from 1.1) and an ileus with NG tube in place.  She states she has had mild chest pain since surgery.  However when asked she is unsure if it is abdominal or chest discomfort.  She points to her upper abdomen.  Her main complaint is pain near her surgical site.  She appears comfortable.  Today the patient went into A. fib at around 8 PM.  Heart rates have been in the 120s with a couple of pauses in the 3-second range.  Most recent vitals heart rate 117, blood pressure 93/47, afebrile, 93% on 2 L.  ECG shows A. fib with mild diffuse ST depression.  Troponin was checked and was 61 -> 165.  Patient has known 3 vessel disease with occlusion of all three grafts from [facility] on 06/06/19.  ASSESSMENT/PLAN Suspect troponin elevation and EKG changes are mild demand ischemia in the setting of A. fib and known three-vessel disease.  Her symptoms are mostly abdominal.  Would not pursue further evaluation for the time being, though we can continue to monitor her symptoms.  Regarding her A. fib, her heart rates are currently in the 120s.  Her blood pressure is borderline with a systolic in the 0000000 and she is occasionally having 3-second pauses.  Therefore we should be conservative with rate controlling agents.  For now will try low dose IV metoprolol for additional rate control.   -- start metoprolol 2.5 mg IV q8h

## 2019-06-19 NOTE — Progress Notes (Signed)
Patient's troponin increased from 26 to 61. Patient still intermittently c/o chest pain but remains hemodynamically stable. Dr. Gwenlyn Found notified of the above - stated it's likely from the chest pain and tachycardia related to the dopamine. Awaiting call back from vascular team. Will monitor.  Joellen Jersey, RN

## 2019-06-20 DIAGNOSIS — Z9889 Other specified postprocedural states: Secondary | ICD-10-CM

## 2019-06-20 DIAGNOSIS — N289 Disorder of kidney and ureter, unspecified: Secondary | ICD-10-CM | POA: Diagnosis not present

## 2019-06-20 DIAGNOSIS — I25118 Atherosclerotic heart disease of native coronary artery with other forms of angina pectoris: Secondary | ICD-10-CM

## 2019-06-20 DIAGNOSIS — I4891 Unspecified atrial fibrillation: Secondary | ICD-10-CM | POA: Diagnosis not present

## 2019-06-20 DIAGNOSIS — R710 Precipitous drop in hematocrit: Secondary | ICD-10-CM

## 2019-06-20 DIAGNOSIS — I959 Hypotension, unspecified: Secondary | ICD-10-CM

## 2019-06-20 LAB — RENAL FUNCTION PANEL
Albumin: 2.7 g/dL — ABNORMAL LOW (ref 3.5–5.0)
Anion gap: 11 (ref 5–15)
BUN: 31 mg/dL — ABNORMAL HIGH (ref 8–23)
CO2: 21 mmol/L — ABNORMAL LOW (ref 22–32)
Calcium: 8.1 mg/dL — ABNORMAL LOW (ref 8.9–10.3)
Chloride: 112 mmol/L — ABNORMAL HIGH (ref 98–111)
Creatinine, Ser: 2.81 mg/dL — ABNORMAL HIGH (ref 0.44–1.00)
GFR calc Af Amer: 19 mL/min — ABNORMAL LOW (ref >60)
GFR calc non Af Amer: 17 mL/min — ABNORMAL LOW (ref >60)
Glucose, Bld: 120 mg/dL — ABNORMAL HIGH (ref 70–99)
Phosphorus: 4.9 mg/dL — ABNORMAL HIGH (ref 2.5–4.6)
Potassium: 4.5 mmol/L (ref 3.5–5.1)
Sodium: 144 mmol/L (ref 135–145)

## 2019-06-20 LAB — PREPARE RBC (CROSSMATCH)

## 2019-06-20 LAB — C4 COMPLEMENT: Complement C4, Body Fluid: 24 mg/dL (ref 12–38)

## 2019-06-20 LAB — CBC
HCT: 28.8 % — ABNORMAL LOW (ref 36.0–46.0)
Hemoglobin: 9 g/dL — ABNORMAL LOW (ref 12.0–15.0)
MCH: 29 pg (ref 26.0–34.0)
MCHC: 31.3 g/dL (ref 30.0–36.0)
MCV: 92.9 fL (ref 80.0–100.0)
Platelets: 137 10*3/uL — ABNORMAL LOW (ref 150–400)
RBC: 3.1 MIL/uL — ABNORMAL LOW (ref 3.87–5.11)
RDW: 16.3 % — ABNORMAL HIGH (ref 11.5–15.5)
WBC: 7.6 10*3/uL (ref 4.0–10.5)
nRBC: 0 % (ref 0.0–0.2)

## 2019-06-20 LAB — C3 COMPLEMENT: C3 Complement: 107 mg/dL (ref 82–167)

## 2019-06-20 LAB — MAGNESIUM: Magnesium: 1.8 mg/dL (ref 1.7–2.4)

## 2019-06-20 MED ORDER — DIGOXIN 0.25 MG/ML IJ SOLN
0.2500 mg | Freq: Once | INTRAMUSCULAR | Status: AC
  Administered 2019-06-20: 0.25 mg via INTRAVENOUS
  Filled 2019-06-20: qty 2

## 2019-06-20 MED ORDER — HEPARIN SODIUM (PORCINE) 5000 UNIT/ML IJ SOLN
5000.0000 [IU] | Freq: Three times a day (TID) | INTRAMUSCULAR | Status: DC
  Administered 2019-06-20 – 2019-06-23 (×9): 5000 [IU] via SUBCUTANEOUS
  Filled 2019-06-20 (×9): qty 1

## 2019-06-20 MED ORDER — SODIUM CHLORIDE 0.9% IV SOLUTION: Freq: Once | INTRAVENOUS | Status: AC

## 2019-06-20 MED ORDER — PHENYLEPHRINE HCL-NACL 10-0.9 MG/250ML-% IV SOLN
0.0000 ug/min | INTRAVENOUS | Status: DC
  Administered 2019-06-20: 20 ug/min via INTRAVENOUS
  Administered 2019-06-21: 25 ug/min via INTRAVENOUS
  Filled 2019-06-20 (×3): qty 250

## 2019-06-20 NOTE — Progress Notes (Signed)
Patient ID: Wendy [name], female   DOB: 1950-09-08, 69 y.o.   MRN: [id number] [facility] Progress Note   Assessment/ Plan:   1.  Acute kidney injury: Likely ATN from hemodynamic mechanisms; surprisingly fractional excretion of sodium pointing towards obstructive etiology but renal ultrasound negative for hydronephrosis.  Low likelihood of atheroembolic renal injury but complement levels pending.  Nonoliguric overnight with 1.2 L urine output; I suspect she might be approaching the plateau phase of ATN before we see renal recovery even small rise of creatinine overnight.  Continue efforts at hemodynamic support and consider PRBC transfusion at this time given significant downtrend of hemoglobin/hematocrit.  No indications for dialysis. 2.  Status post aortobifemoral bypass postoperative day #2.  With good wound healing noted.  Continues to have evidence of ileus. 3.  Hypotension: Likely multifactorial from ongoing narcotic analgesia and residual effects of anesthesia, will continue to follow with supportive management. 4.  Anemia: Overnight hemoglobin and hematocrit drop noted; would recommend 1 unit PRBC transfusion for improvement of hemodynamic status/renal perfusion.  Subjective:   Complains of epigastric pain, had some chest discomfort overnight-none at this time.   Objective:   BP (!) 100/49   Pulse (!) 107   Temp (!) 97.5 F (36.4 C)   Resp 14   SpO2 98%   Intake/Output Summary (Last 24 hours) at 06/20/2019 0755 Last data filed at 06/20/2019 0700 Gross per 24 hour  Intake 3650.86 ml  Output 1930 ml  Net 1720.86 ml   Weight change:   Physical Exam: Gen: Appears uncomfortable resting in bed, NG tube in situ CVS: Pulse irregularly irregular tachycardia, S1 and S2 normal Resp: Anteriorly clear to auscultation, no rales/rhonchi Abd: Soft, nontender, bowel sounds normal Ext: No lower extremity edema  Imaging: US RENAL  Result Date: 06/19/2019 CLINICAL DATA:  AK  I EXAM: RENAL / URINARY TRACT ULTRASOUND COMPLETE COMPARISON:  CT abdomen pelvis 05/14/2014, abdominal ultrasound 09/15/2013 FINDINGS: Right Kidney: Suboptimal evaluation due to rib shadowing and bowel gas. Renal measurements: 9.6 x 5.4 x 3.7 = volume: 101.5 mL. Mildly increased cortical echogenicity with normal cortical thickness. No concerning renal mass, hydronephrosis or visible urolithiasis. Left Kidney: Renal measurements: 10.7 x 5.1 x 4.4 cm = volume: 124 mL. Mildly increased cortical echogenicity with normal cortical thickness. No concerning renal mass, hydronephrosis or visible urolithiasis. Bladder: Bladder is decompressed by inflated Foley catheter. Other: None. IMPRESSION: Suboptimal evaluation of the right kidney secondary to rib shadowing and bowel gas. Mildly increased bilateral renal cortical echogenicity may reflect medical renal disease. Bladder decompressed by Foley catheter. Electronically Signed   By: Lovena Le M.D.   On: 06/19/2019 19:41   DG Chest Port 1 View  Result Date: 06/19/2019 CLINICAL DATA:  Chest pain.  Recent aortobifemoral bypass graft EXAM: PORTABLE CHEST 1 VIEW COMPARISON:  June 18, 2019 FINDINGS: Swan-Ganz catheter has been removed with Cordis tip noted in the superior vena cava. Nasogastric tube tip and side port are below the diaphragm. No pneumothorax. There is minimal atelectasis in the right lower lobe. Lungs elsewhere are clear. Heart size and pulmonary vascular normal. Patient is status post coronary artery bypass grafting. Postoperative changes noted in the proximal left humerus. IMPRESSION: Tube and catheter positions as described without pneumothorax. Slight right lower lobe atelectasis. Lungs elsewhere clear. Stable cardiac silhouette. Electronically Signed   By: Lowella Grip III M.D.   On: 06/19/2019 09:24   DG Chest Port 1 View  Result Date: 06/18/2019 CLINICAL DATA:  Postoperative aortobifemoral bypass  EXAM: PORTABLE CHEST 1 VIEW COMPARISON:  Most  recent radiograph 09/13/2018 FINDINGS: A right IJ catheter sheath is positioned through which passes a Swan-Ganz catheter, the tip of which is approximating the pulmonary trunk. A transesophageal tube tip courses below the level of imaging, better seen on concurrent abdominal radiograph. Postsurgical changes related to prior CABG including intact and aligned sternotomy wires and multiple surgical clips projecting over the mediastinum. Coronary stent graft noted along the right heart border. Prior left humeral plate and screw fixation. Telemetry leads overlie the chest. Lung volumes are low with some streaky areas of opacity favoring atelectasis. No new consolidative opacity. No pneumothorax or visible effusion. No acute osseous or soft tissue abnormality. IMPRESSION: 1. Support lines and tubes as above. 2. Low lung volumes with some streaky areas of opacity favoring atelectasis. No new consolidative opacity or effusion. Electronically Signed   By: Lovena Le M.D.   On: 06/18/2019 14:59   DG Abd Portable 1V  Result Date: 06/18/2019 CLINICAL DATA:  Postop aortobifemoral bypass EXAM: PORTABLE ABDOMEN - 1 VIEW COMPARISON:  CT abdomen pelvis May 14, 2014 FINDINGS: Transesophageal tube tip terminates in the midline abdomen with the side port distal to the expected location of the GE junction. Bowel gas pattern is nonspecific though does not appear frankly obstructive at this time. Surgical clips are noted in the right upper quadrant, midline abdomen and about the iliac regions bilaterally. Degenerative changes noted in the spine, pelvis, and left hip. Total right hip arthroplasty is noted and is in expected position. IMPRESSION: 1. Transesophageal tube tip terminates in the midline abdomen with the side port distal to the expected location of the GE junction. No high-grade obstructive bowel gas pattern. Electronically Signed   By: Lovena Le M.D.   On: 06/18/2019 14:57    Labs: BMET Recent Labs  Lab  06/14/19 1415 06/18/19 0811 06/18/19 1031 06/18/19 1145 06/18/19 1220 06/18/19 1240 06/19/19 0244 06/19/19 1239 06/19/19 2304  NA 140   < > 138 139 139 139 138 140 141  K 4.8   < > 3.9 4.0 4.4 4.4 4.2 4.2 4.4  CL 105  --   --   --   --  109 106 110 109  CO2 26  --   --   --   --  22 23 21* 20*  GLUCOSE 92  --   --   --   --  196* 150* 143* 132*  BUN 22  --   --   --   --  25* 31* 33* 33*  CREATININE 1.17*  --   --   --   --  1.40* 2.28* 2.51* 2.86*  CALCIUM 9.3  --   --   --   --  8.0* 7.9* 7.6* 7.9*  PHOS  --   --   --   --   --   --   --   --  4.9*   < > = values in this interval not displayed.   CBC Recent Labs  Lab 06/18/19 1240 06/19/19 0244 06/19/19 1239 06/19/19 2304  WBC 6.7 8.1 5.7 5.6  HGB 10.5* 10.6* 8.9* 7.9*  HCT 33.5* 33.2* 27.9* 25.5*  MCV 91.5 89.7 91.8 91.7  PLT 188 190 158 133*    Medications:    . Chlorhexidine Gluconate Cloth  6 each Topical Daily  . Chlorhexidine Gluconate Cloth  6 each Topical Q0600  . mouth rinse  15 mL Mouth Rinse BID  . morphine  Intravenous Q4H  . mupirocin ointment  1 application Nasal BID  . pantoprazole (PROTONIX) IV  40 mg Intravenous Daily  . sodium chloride flush  10-40 mL Intracatheter Q12H   Elmarie Shiley, MD 06/20/2019, 7:55 AM

## 2019-06-20 NOTE — Progress Notes (Addendum)
Progress Note    06/20/2019 7:46 AM 2 Days Post-Op  Subjective: She is complaining of epigastric pain this morning   Vitals:   06/20/19 0700 06/20/19 0743  BP: (!) 100/49   Pulse: (!) 107   Resp: 14   Temp:  (!) 97.5 F (36.4 C)  SpO2: 98%     Physical Exam: General appearance: The patient is awake, alert and in no apparent distress. Oriented x4 Cardiac: Irregular rhythm with slightly elevated rate Lungs: Lungs are clear to auscultation bilaterally. No dyspnea Incisions: midline incision and bilateral groin incisions are well approximated without signs of infection or hematoma Extremities: Both feet are warm and well perfused. 2+ palpable dorsalis pedis pulses bilaterally. Motor function and sensation intact Abdomen: Soft, nondistended. Active bowel sounds this morning  CBC    Component Value Date/Time   WBC 5.6 06/19/2019 2304   RBC 2.78 (L) 06/19/2019 2304   HGB 7.9 (L) 06/19/2019 2304   HGB 13.4 06/04/2019 1036   HCT 25.5 (L) 06/19/2019 2304   HCT 41.7 06/04/2019 1036   PLT 133 (L) 06/19/2019 2304   PLT 205 06/04/2019 1036   MCV 91.7 06/19/2019 2304   MCV 86 06/04/2019 1036   MCH 28.4 06/19/2019 2304   MCHC 31.0 06/19/2019 2304   RDW 16.5 (H) 06/19/2019 2304   RDW 17.4 (H) 06/04/2019 1036   LYMPHSABS 1.0 09/07/2017 1800   MONOABS 0.4 09/07/2017 1800   EOSABS 0.1 09/07/2017 1800   BASOSABS 0.1 09/07/2017 1800    BMET    Component Value Date/Time   NA 141 06/19/2019 2304   NA 141 06/04/2019 1036   K 4.4 06/19/2019 2304   CL 109 06/19/2019 2304   CO2 20 (L) 06/19/2019 2304   GLUCOSE 132 (H) 06/19/2019 2304   BUN 33 (H) 06/19/2019 2304   BUN 14 06/04/2019 1036   CREATININE 2.86 (H) 06/19/2019 2304   CREATININE 0.96 08/13/2015 0939   CALCIUM 7.9 (L) 06/19/2019 2304   GFRNONAA 16 (L) 06/19/2019 2304   GFRAA 19 (L) 06/19/2019 2304     Intake/Output Summary (Last 24 hours) at 06/20/2019 0746 Last data filed at 06/20/2019 0700 Gross per 24 hour    Intake 3650.86 ml  Output 1930 ml  Net 1720.86 ml   Urine output cc NG tube output 600 cc  HOSPITAL MEDICATIONS Scheduled Meds: . Chlorhexidine Gluconate Cloth  6 each Topical Daily  . Chlorhexidine Gluconate Cloth  6 each Topical Q0600  . mouth rinse  15 mL Mouth Rinse BID  . morphine   Intravenous Q4H  . mupirocin ointment  1 application Nasal BID  . pantoprazole (PROTONIX) IV  40 mg Intravenous Daily  . sodium chloride flush  10-40 mL Intracatheter Q12H   Continuous Infusions: . sodium chloride 80 mL/hr at 06/20/19 0700  . DOPamine Stopped (06/19/19 1228)  . norepinephrine (LEVOPHED) Adult infusion 2 mcg/min (06/20/19 0700)   PRN Meds:.acetaminophen **OR** acetaminophen, alum & mag hydroxide-simeth, diphenhydrAMINE **OR** diphenhydrAMINE, guaiFENesin-dextromethorphan, hydrALAZINE, labetalol, metoprolol tartrate, morphine injection, naloxone **AND** sodium chloride flush, ondansetron, phenol, potassium chloride, sodium chloride flush  Assessment:  69 y.o. female is s/p: Aortobifemoral bypass.   2 Days Post-Op: Palpable pedal pulses. Incisions healing nicely. Decreased NG output although still quite bilious. Bowel sounds improved today. She is tolerating ice chips only.  New onset atrial fibrillation with rapid ventricular rate yesterday evening. Appreciate cardiology evaluation and recommendations. Dopamine infusion ongoing.  Acute kidney injury with increased urine output although creatinine is up slightly again today. Appreciate  nephrology evaluation and recommendations.  Drift in hemoglobin of approximately 1 g. Likely due to a component of hemodilution after fluid bolus yesterday. Continue to monitor. Will starting heparin DVT prophylaxis, OOB to chair and clamp NGT. Discussed with Dr. Donzetta Matters this morning.  -DVT prophylaxis:  SCDs.   Risa Grill, PA-C [facility] [phone] 06/20/2019  7:46 AM   I have independently interviewed and examined  patient and agree with PA assessment and plan above.  Appreciate input from cardiology and nephrology.  Plan will be to get her out of bed today.  We will transfuse for goal hemoglobin of 10.  She remains on Levophed at this time.  Continue NG tube and clamp when she is out of bed.  She has strongly palpable dorsalis pedis pulses bilaterally.  Lavene Penagos C. Donzetta Matters, MD [facility] [place] Office: [phone] Pager: [phone]

## 2019-06-20 NOTE — Progress Notes (Signed)
Progress Note  Patient Name: Wendy [name] Date of Encounter: 06/20/2019  Primary Cardiologist: [name], MD   Subjective   Martin Majestic into afib yesterday evening. This AM, having intermittent 2 second pauses and occasional bradycardia that appears junctional  Received Labetalol 10 mg IV yesterday at 9:20. Ordered for IV metoprolol but not given overnight. She was on dopamine yesterday, but this was discontinued. She had to be started on levophed with the atrial fibrillation and RVR.  Inpatient Medications    Scheduled Meds: . Chlorhexidine Gluconate Cloth  6 each Topical Daily  . Chlorhexidine Gluconate Cloth  6 each Topical Q0600  . heparin  5,000 Units Subcutaneous Q8H  . mouth rinse  15 mL Mouth Rinse BID  . morphine   Intravenous Q4H  . mupirocin ointment  1 application Nasal BID  . pantoprazole (PROTONIX) IV  40 mg Intravenous Daily  . sodium chloride flush  10-40 mL Intracatheter Q12H   Continuous Infusions: . sodium chloride 80 mL/hr at 06/20/19 0910  . DOPamine Stopped (06/19/19 1228)  . norepinephrine (LEVOPHED) Adult infusion 2 mcg/min (06/20/19 0700)   PRN Meds: acetaminophen **OR** acetaminophen, alum & mag hydroxide-simeth, diphenhydrAMINE **OR** diphenhydrAMINE, guaiFENesin-dextromethorphan, hydrALAZINE, labetalol, metoprolol tartrate, morphine injection, naloxone **AND** sodium chloride flush, ondansetron, phenol, potassium chloride, sodium chloride flush   Vital Signs    Vitals:   06/20/19 0819 06/20/19 0859 06/20/19 0915 06/20/19 0921  BP:   (!) 101/59   Pulse:  (!) 126 (!) 111   Resp: 17 17 15    Temp:  (!) 97.5 F (36.4 C)  (!) 97.5 F (36.4 C)  TempSrc:  Oral  Oral  SpO2: 98% 98% 97%     Intake/Output Summary (Last 24 hours) at 06/20/2019 0937 Last data filed at 06/20/2019 0900 Gross per 24 hour  Intake 2393.22 ml  Output 2040 ml  Net 353.22 ml   Last 3 Weights 06/14/2019 06/05/2019 05/22/2019  Weight (lbs) 144 lb 6.4 oz 141 lb 141 lb  Weight  (kg) 65.499 kg 63.957 kg 63.957 kg      Telemetry    Atrial fibrillation with RVR since yesterday evening. Occasional 2 second pauses and junctional bradycardia in the 30s-40s - Personally Reviewed  ECG    afib RVR - Personally Reviewed  Physical Exam   GEN: Lying flat in bed, conversant, NG tube in place Neck: No JVD visible Cardiac: rapid, irregular S1 and S2. No murmurs appreciated Respiratory: Clear to auscultation bilaterally, though poor inspiratory effort GI: Soft, non-distended. Tender in epigastric area MS: No edema; No deformity. Neuro:  Nonfocal  Psych: Normal affect   Labs    High Sensitivity Troponin:   Recent Labs  Lab 06/19/19 1239 06/19/19 1435 06/19/19 2115  TROPONINIHS 26* 61* 165*      Chemistry Recent Labs  Lab 06/14/19 1415 06/18/19 0811 06/19/19 0244 06/19/19 1239 06/19/19 2304  NA 140   < > 138 140 141  K 4.8   < > 4.2 4.2 4.4  CL 105   < > 106 110 109  CO2 26   < > 23 21* 20*  GLUCOSE 92   < > 150* 143* 132*  BUN 22   < > 31* 33* 33*  CREATININE 1.17*   < > 2.28* 2.51* 2.86*  CALCIUM 9.3   < > 7.9* 7.6* 7.9*  PROT 6.6  --  5.0*  --   --   ALBUMIN 3.2*  --  2.3*  --  3.2*  AST 13*  --  20  --   --   ALT 10  --  13  --   --   ALKPHOS 62  --  38  --   --   BILITOT 0.6  --  0.5  --   --   GFRNONAA 48*   < > 22* 19* 16*  GFRAA 56*   < > 25* 22* 19*  ANIONGAP 9   < > 9 9 12    < > = values in this interval not displayed.     Hematology Recent Labs  Lab 06/19/19 0244 06/19/19 1239 06/19/19 2304  WBC 8.1 5.7 5.6  RBC 3.70* 3.04* 2.78*  HGB 10.6* 8.9* 7.9*  HCT 33.2* 27.9* 25.5*  MCV 89.7 91.8 91.7  MCH 28.6 29.3 28.4  MCHC 31.9 31.9 31.0  RDW 15.9* 16.3* 16.5*  PLT 190 158 133*    BNP Recent Labs  Lab 06/19/19 1239  BNP 366.7*     DDimer No results for input(s): DDIMER in the last 168 hours.   Radiology    US RENAL  Result Date: 06/19/2019 CLINICAL DATA:  AK I EXAM: RENAL / URINARY TRACT ULTRASOUND COMPLETE  COMPARISON:  CT abdomen pelvis 05/14/2014, abdominal ultrasound 09/15/2013 FINDINGS: Right Kidney: Suboptimal evaluation due to rib shadowing and bowel gas. Renal measurements: 9.6 x 5.4 x 3.7 = volume: 101.5 mL. Mildly increased cortical echogenicity with normal cortical thickness. No concerning renal mass, hydronephrosis or visible urolithiasis. Left Kidney: Renal measurements: 10.7 x 5.1 x 4.4 cm = volume: 124 mL. Mildly increased cortical echogenicity with normal cortical thickness. No concerning renal mass, hydronephrosis or visible urolithiasis. Bladder: Bladder is decompressed by inflated Foley catheter. Other: None. IMPRESSION: Suboptimal evaluation of the right kidney secondary to rib shadowing and bowel gas. Mildly increased bilateral renal cortical echogenicity may reflect medical renal disease. Bladder decompressed by Foley catheter. Electronically Signed   By: Lovena Le M.D.   On: 06/19/2019 19:41   DG Chest Port 1 View  Result Date: 06/19/2019 CLINICAL DATA:  Chest pain.  Recent aortobifemoral bypass graft EXAM: PORTABLE CHEST 1 VIEW COMPARISON:  June 18, 2019 FINDINGS: Swan-Ganz catheter has been removed with Cordis tip noted in the superior vena cava. Nasogastric tube tip and side port are below the diaphragm. No pneumothorax. There is minimal atelectasis in the right lower lobe. Lungs elsewhere are clear. Heart size and pulmonary vascular normal. Patient is status post coronary artery bypass grafting. Postoperative changes noted in the proximal left humerus. IMPRESSION: Tube and catheter positions as described without pneumothorax. Slight right lower lobe atelectasis. Lungs elsewhere clear. Stable cardiac silhouette. Electronically Signed   By: Lowella Grip III M.D.   On: 06/19/2019 09:24   DG Chest Port 1 View  Result Date: 06/18/2019 CLINICAL DATA:  Postoperative aortobifemoral bypass EXAM: PORTABLE CHEST 1 VIEW COMPARISON:  Most recent radiograph 09/13/2018 FINDINGS: A right IJ  catheter sheath is positioned through which passes a Swan-Ganz catheter, the tip of which is approximating the pulmonary trunk. A transesophageal tube tip courses below the level of imaging, better seen on concurrent abdominal radiograph. Postsurgical changes related to prior CABG including intact and aligned sternotomy wires and multiple surgical clips projecting over the mediastinum. Coronary stent graft noted along the right heart border. Prior left humeral plate and screw fixation. Telemetry leads overlie the chest. Lung volumes are low with some streaky areas of opacity favoring atelectasis. No new consolidative opacity. No pneumothorax or visible effusion. No acute osseous or soft tissue abnormality. IMPRESSION: 1. Support  lines and tubes as above. 2. Low lung volumes with some streaky areas of opacity favoring atelectasis. No new consolidative opacity or effusion. Electronically Signed   By: Lovena Le M.D.   On: 06/18/2019 14:59   DG Abd Portable 1V  Result Date: 06/18/2019 CLINICAL DATA:  Postop aortobifemoral bypass EXAM: PORTABLE ABDOMEN - 1 VIEW COMPARISON:  CT abdomen pelvis May 14, 2014 FINDINGS: Transesophageal tube tip terminates in the midline abdomen with the side port distal to the expected location of the GE junction. Bowel gas pattern is nonspecific though does not appear frankly obstructive at this time. Surgical clips are noted in the right upper quadrant, midline abdomen and about the iliac regions bilaterally. Degenerative changes noted in the spine, pelvis, and left hip. Total right hip arthroplasty is noted and is in expected position. IMPRESSION: 1. Transesophageal tube tip terminates in the midline abdomen with the side port distal to the expected location of the GE junction. No high-grade obstructive bowel gas pattern. Electronically Signed   By: Lovena Le M.D.   On: 06/18/2019 14:57    Cardiac Studies   Echo 06/17/19 1. Left ventricular ejection fraction, by estimation,  is 55 to 60%. The  left ventricle has normal function. The left ventricle demonstrates  regional wall motion abnormalities (see scoring diagram/findings for  description). Left ventricular diastolic  parameters were normal.  2. Right ventricular systolic function is low normal. The right  ventricular size is normal. There is moderately elevated pulmonary artery  systolic pressure.  3. The mitral valve is normal in structure. Mild mitral valve  regurgitation.  4. The aortic valve is normal in structure. Aortic valve regurgitation is  not visualized.  5. The inferior vena cava is normal in size with greater than 50%  respiratory variability, suggesting right atrial pressure of 3 mmHg.   Cath 06/05/19  Prox RCA to Mid RCA lesion is 100% stenosed.  Dist Cx lesion is 80% stenosed.  3rd Mrg lesion is 100% stenosed.  1st Mrg-1 lesion is 100% stenosed.  1st Mrg-2 lesion is 100% stenosed.  SVG graft was not visualized.  Origin to Prox Graft lesion is 100% stenosed.  SVG graft was not visualized.  Origin to Prox Graft lesion is 100% stenosed.  Prox LAD to Mid LAD lesion is 60% stenosed.  1st Diag lesion is 100% stenosed.  LIMA graft was not visualized.  Origin to Dist Graft lesion is 100% stenosed.   1. Stable three vessel CAD s/p 3V CABG with known occlusion of all three grafts.  2. Moderate, calcified mid LAD stenosis that is angiographically improved from cath in 2018.  3. Chronic occlusion proximal RCA. The vessel fills from left to right collaterals.  4. Chronic occlusion obtuse marginal and distal Circumflex.   Recommendations: Continue medical management of CAD. Proceed with planning for LE bypass surgery.   Patient Profile     69 y.o. female a hx of PAD, CAD status post NSTEMI s/p CABG x3 (SVG-OM, LIMA-LAD, SVG-RCA in 1998) with PCI on 2013 and 2015, PAF,  hypertension, chronic combined systolic and diastolic heart failure, COPD, GERD, peripheral neuropathy who  underwent aortobifemoral bypass and reimplantation of right profunda femoris artery on aortobifemoral limb on 06/18/19 by Dr. Donzetta Matters.  Assessment & Plan    Atrial fibrillation with RVR: -given hypotension, would avoid beta blocker or calcium channel blocker -she has renal dysfunction and occasional junctional beats. This makes agents such as digoxin and amiodarone difficult to monitor, as junctional rhythm is not uncommon  with these agents and makes monitoring for toxicity complex -will give one dose of IV digoxin to monitor response. Junctional rhythm cannot be used as marker for toxicity. May not get significant HR reduction with pressor on board, but given faster off time than amiodarone, will trial first -if no significant response to digoxin, would trial amiodarone IV. Again, limited options and concern that this may exacerbate intermittent junctional rhythm, but if needed may be an option -if blood pressure support needed, would use phenylephrine or vasopression to avoid driving heart rate -K is 4.4. Prior Mg 2.3, will add on today -intermittent junctional rhythm may be exacerbated by ileus  Post-aortobifemoral bypass and reimplantation of right profunda femoris artery on aortobifemoral limb on 06/18/19 by Dr. Donzetta Matters, with post operative blood loss anemia -receiving blood for Hgb 7.9 given symptoms of chest pain, likely demand ischemia -preop Hgb [id number] -also complicated by ileus, strict NPO, NG tube is on suction  Known severe CAD, s/p prior CABG -cath 06/05/19 showed stable 3V disease with known occlusion of all 3 bypass grafts -she will have demand ischemia with elevated heart rate or stress. Elevated troponin in line with this. -clopidogrel on hold, restart when able -on imdur, ranolazine as outpatient, on hold given hypotension  Hyperlipidemia, LDL goal <70 -on atorvastatin 80 mg, ezetimibe 10 mg as outpatient, on hold given NPO -restart when able  Paroxysmal atrial  fibrillation -CHA2DS2/VAS Stroke Risk Points = 5 -on apixaban as outpatient -when clear from surgical standpoint, would start heparin drip given NPO status  Hypertension: -hypotensive post operatively, on levophed currently for blood pressure support -on valsartan-HCTZ as outpatient, on hold  Chronic diastolic heart failure (prior systolic heart failure) -most recent EF was preserved -has lasix PRN at home -laying comfortably flat, no diuresis today  Acute renal dysfunction: Cr 2.86 today, was 1.17 prior to surgery -receiving blood  CRITICAL CARE Patient is critically ill with multiple organ systems affected and requires high complexity decision making. Total critical care time: 40 minutes. This time includes gathering of history, evaluation of patient's response to treatment, examination of patient, review of laboratory and imaging studies, and coordination with consultants.   For questions or updates, please contact [place] Please consult [email or web address] for contact info under     Signed, Buford Dresser, MD  06/20/2019, 9:37 AM

## 2019-06-20 NOTE — Evaluation (Signed)
Physical Therapy Evaluation Patient Details Name: Wendy [name] MRN: [id number] DOB: 1950/06/16 Today's Date: 06/20/2019   History of Present Illness  Pt is a 69yo female s/p aortobifemoral bypass due to critical R LE ischemia. PMHx: L to R femorofemoral bypass. PMHx: COPD, CAD, HTN, past MI, PVDx, cardiac stents, RT THA, neuropathy, HLD, OSA  Clinical Impression  Pt pleasant and willing to initiate mobility and get OOB. Once OOB pt able to progress to limited gait and standing for brushing teeth. Pt with noted HR elevation to the 140s with brief rise as high as 158 limiting further activity. Pt on 2L throughout session with SpO2  97%, attempted RA and sats dropped to 89%. Pt with decreased strength, function, mobility and activity tolerance who will benefit from acute therapy to maximize mobility, independence and function to decrease burden of care.   102/62(74) pre session 110/70 (84) post session HR 101-158     Follow Up Recommendations Home health PT;Supervision/Assistance - 24 hour    Equipment Recommendations  None recommended by PT    Recommendations for Other Services       Precautions / Restrictions Precautions Precautions: Fall Precaution Comments: NGT      Mobility  Bed Mobility Overal bed mobility: Needs Assistance Bed Mobility: Rolling;Sidelying to Sit Rolling: Min assist Sidelying to sit: Mod assist       General bed mobility comments: pt with cues and increased time to roll to right, increased time and assist to lift trunk from surface. Pt with increased time and effort for all mobility and somewhat resistant to transition from side to sitting. Min assist to scoot to EOB with increased time and cues  Transfers Overall transfer level: Needs assistance   Transfers: Sit to/from Stand Sit to Stand: Min guard         General transfer comment: cues for hand placement, safety, increased time  Ambulation/Gait Ambulation/Gait assistance: Min guard Gait  Distance (Feet): 20 Feet Assistive device: Rolling walker (2 wheeled) Gait Pattern/deviations: Step-through pattern;Decreased stride length;Trunk flexed   Gait velocity interpretation: >2.62 ft/sec, indicative of community ambulatory General Gait Details: cues for posture and proximity to RW with assist for lines and chair follow. HR up to 158 during gait and standing for brushing teeth with pt cued to sit and cease activity  Stairs            Wheelchair Mobility    Modified Rankin (Stroke Patients Only)       Balance Overall balance assessment: History of Falls                                           Pertinent Vitals/Pain Pain Assessment: 0-10 Pain Score: 5  Pain Location: abdomen Pain Descriptors / Indicators: Aching;Sore Pain Intervention(s): Limited activity within patient's tolerance;Monitored during session;Repositioned    Home Living Family/patient expects to be discharged to:: Private residence Living Arrangements: Other relatives Available Help at Discharge: Family;Available 24 hours/day Type of Home: Mobile home Home Access: Ramped entrance     Home Layout: One level Home Equipment: Lucas - 2 wheels;Walker - 4 wheels;Cane - quad;Bedside commode      Prior Function Level of Independence: Independent with assistive device(s)         Comments: multiple falls     Hand Dominance        Extremity/Trunk Assessment   Upper Extremity Assessment Upper Extremity Assessment: Defer  to OT evaluation    Lower Extremity Assessment Lower Extremity Assessment: Generalized weakness    Cervical / Trunk Assessment Cervical / Trunk Assessment: Other exceptions Cervical / Trunk Exceptions: rounded shoulders  Communication   Communication: No difficulties  Cognition Arousal/Alertness: Awake/alert Behavior During Therapy: WFL for tasks assessed/performed Overall Cognitive Status: Within Functional Limits for tasks assessed                                         General Comments      Exercises     Assessment/Plan    PT Assessment Patient needs continued PT services  PT Problem List Decreased strength;Decreased mobility;Decreased activity tolerance;Decreased balance;Decreased knowledge of use of DME;Pain;Cardiopulmonary status limiting activity       PT Treatment Interventions Gait training;Balance training;Functional mobility training;Therapeutic activities;Patient/family education;DME instruction;Therapeutic exercise    PT Goals (Current goals can be found in the Care Plan section)  Acute Rehab PT Goals Patient Stated Goal: return home PT Goal Formulation: With patient Time For Goal Achievement: 07/04/19 Potential to Achieve Goals: Fair    Frequency Min 3X/week   Barriers to discharge Decreased caregiver support      Co-evaluation PT/OT/SLP Co-Evaluation/Treatment: Yes Reason for Co-Treatment: Complexity of the patient's impairments (multi-system involvement);For patient/therapist safety PT goals addressed during session: Mobility/safety with mobility;Proper use of DME         AM-PAC PT "6 Clicks" Mobility  Outcome Measure Help needed turning from your back to your side while in a flat bed without using bedrails?: A Little Help needed moving from lying on your back to sitting on the side of a flat bed without using bedrails?: A Lot Help needed moving to and from a bed to a chair (including a wheelchair)?: A Little Help needed standing up from a chair using your arms (e.g., wheelchair or bedside chair)?: A Little Help needed to walk in hospital room?: A Little Help needed climbing 3-5 steps with a railing? : A Lot 6 Click Score: 16    End of Session   Activity Tolerance: Patient tolerated treatment well Patient left: in chair;with call bell/phone within reach;with chair alarm set Nurse Communication: Mobility status PT Visit Diagnosis: Other abnormalities of gait and mobility  (R26.89);Difficulty in walking, not elsewhere classified (R26.2)    Time: [id number] PT Time Calculation (min) (ACUTE ONLY): 31 min   Charges:   PT Evaluation $PT Eval Moderate Complexity: 1 Mod          [place], PT Acute Rehabilitation Services Pager: [phone] Office: [phone]   Sandy Salaam Aadon Gorelik 06/20/2019, 12:41 PM

## 2019-06-20 NOTE — Evaluation (Signed)
Occupational Therapy Evaluation Patient Details Name: Wendy [name] MRN: [id number] DOB: 1950/04/21 Today's Date: 06/20/2019    History of Present Illness Pt is a 69yo female s/p aortobifemoral bypass due to critical R LE ischemia. PMHx: L to R femorofemoral bypass. PMHx: COPD, CAD, HTN, past MI, PVDx, cardiac stents, RT THA, neuropathy, HLD, OSA   Clinical Impression   PTA pt living with family, reports she was independent for BADLs but has had multiple recent falls. At time of eval pt is mod A for bed mobility and min guard for sit <> stands with RW. Pt completed household level of functional mobility to the sink to complete two grooming tasks. Pt was able to stand for oral care, and HR was up to 158 bpm. Pt then required rest break to finish brushing hair. Given current status, pt will benefit from [facility] to continue BADL progression and safety in home environment. Will continue to follow per POC listed below.    Follow Up Recommendations  Home health OT;Supervision/Assistance - 24 hour    Equipment Recommendations  3 in 1 bedside commode    Recommendations for Other Services       Precautions / Restrictions Precautions Precautions: Fall Precaution Comments: NGT Restrictions Weight Bearing Restrictions: No      Mobility Bed Mobility Overal bed mobility: Needs Assistance Bed Mobility: Rolling;Sidelying to Sit Rolling: Min assist Sidelying to sit: Mod assist       General bed mobility comments: pt with cues and increased time to roll to right, increased time and assist to lift trunk from surface. Pt with increased time and effort for all mobility and somewhat resistant to transition from side to sitting. Min assist to scoot to EOB with increased time and cues  Transfers Overall transfer level: Needs assistance   Transfers: Sit to/from Stand Sit to Stand: Min guard         General transfer comment: cues for hand placement, safety, increased time    Balance Overall  balance assessment: History of Falls;Needs assistance Sitting-balance support: Feet supported Sitting balance-Leahy Scale: Fair     Standing balance support: Bilateral upper extremity supported;During functional activity Standing balance-Leahy Scale: Poor Standing balance comment: reliant on external support                           ADL either performed or assessed with clinical judgement   ADL Overall ADL's : Needs assistance/impaired Eating/Feeding: NPO   Grooming: Min guard;Standing;Oral care Grooming Details (indicate cue type and reason): standing at sink at min guard level to brush teeth. Pt needing rest break with HR up to 158 bpm Upper Body Bathing: Minimal assistance;Sitting   Lower Body Bathing: Minimal assistance;Sit to/from stand;Sitting/lateral leans   Upper Body Dressing : Minimal assistance;Sitting   Lower Body Dressing: Minimal assistance;Sit to/from stand;Sitting/lateral leans   Toilet Transfer: Minimal assistance;Ambulation;Regular Toilet;Grab bars;RW   Toileting- Clothing Manipulation and Hygiene: Minimal assistance;Sit to/from stand;Sitting/lateral lean   Tub/ Shower Transfer: Minimal assistance   Functional mobility during ADLs: Minimal assistance;Rolling walker       Vision Baseline Vision/History: Wears glasses Wears Glasses: At all times Patient Visual Report: No change from baseline       Perception     Praxis      Pertinent Vitals/Pain Pain Assessment: 0-10 Pain Score: 5  Pain Location: abdomen Pain Descriptors / Indicators: Aching;Sore Pain Intervention(s): Limited activity within patient's tolerance;Monitored during session;Repositioned     Hand Dominance  Extremity/Trunk Assessment Upper Extremity Assessment Upper Extremity Assessment: Generalized weakness   Lower Extremity Assessment Lower Extremity Assessment: Defer to PT evaluation   Cervical / Trunk Assessment Cervical / Trunk Assessment: Other  exceptions Cervical / Trunk Exceptions: rounded shoulders   Communication Communication Communication: No difficulties   Cognition Arousal/Alertness: Awake/alert Behavior During Therapy: WFL for tasks assessed/performed Overall Cognitive Status: Within Functional Limits for tasks assessed                                     General Comments       Exercises     Shoulder Instructions      Home Living Family/patient expects to be discharged to:: Private residence Living Arrangements: Other relatives(aunt and two nephews) Available Help at Discharge: Family;Available 24 hours/day Type of Home: Mobile home Home Access: Ramped entrance     Home Layout: One level     Bathroom Shower/Tub: Teacher, early years/pre: Standard     Home Equipment: Environmental consultant - 2 wheels;Walker - 4 wheels;Cane - quad;Bedside commode          Prior Functioning/Environment Level of Independence: Independent with assistive device(s)        Comments: multiple falls, using cane        OT Problem List: Decreased strength;Decreased knowledge of use of DME or AE;Decreased activity tolerance;Cardiopulmonary status limiting activity;Impaired balance (sitting and/or standing)      OT Treatment/Interventions: Self-care/ADL training;Therapeutic exercise;Patient/family education;Balance training;Energy conservation;Therapeutic activities;DME and/or AE instruction;Cognitive remediation/compensation    OT Goals(Current goals can be found in the care plan section) Acute Rehab OT Goals Patient Stated Goal: return home OT Goal Formulation: With patient Time For Goal Achievement: 07/04/19 Potential to Achieve Goals: Good  OT Frequency: Min 2X/week   Barriers to D/C:            Co-evaluation PT/OT/SLP Co-Evaluation/Treatment: Yes Reason for Co-Treatment: Complexity of the patient's impairments (multi-system involvement);To address functional/ADL transfers PT goals addressed  during session: Mobility/safety with mobility;Proper use of DME OT goals addressed during session: ADL's and self-care      AM-PAC OT "6 Clicks" Daily Activity     Outcome Measure Help from another person eating meals?: Total(NPO) Help from another person taking care of personal grooming?: A Little Help from another person toileting, which includes using toliet, bedpan, or urinal?: A Little Help from another person bathing (including washing, rinsing, drying)?: A Little Help from another person to put on and taking off regular upper body clothing?: A Little Help from another person to put on and taking off regular lower body clothing?: A Little 6 Click Score: 16   End of Session Equipment Utilized During Treatment: Gait belt;Rolling walker Nurse Communication: Mobility status  Activity Tolerance: Patient tolerated treatment well Patient left: in chair;with call bell/phone within reach  OT Visit Diagnosis: Unsteadiness on feet (R26.81);Other abnormalities of gait and mobility (R26.89);Muscle weakness (generalized) (M62.81);Repeated falls (R29.6);History of falling (Z91.81)                Time: [id number] OT Time Calculation (min): 29 min Charges:  OT General Charges $OT Visit: 1 Visit OT Evaluation $OT Eval Moderate Complexity: Ray, MSOT, OTR/L Acute Rehabilitation Services [facility] Office Number: [phone] Pager: [phone]  Zenovia Jarred 06/20/2019, 1:55 PM

## 2019-06-20 NOTE — Plan of Care (Signed)
  Problem: Education: Goal: Knowledge of the prescribed therapeutic regimen will improve Outcome: Progressing   Problem: Clinical Measurements: Goal: Postoperative complications will be avoided or minimized Outcome: Progressing   Problem: Respiratory: Goal: Respiratory status will improve Outcome: Progressing   Problem: Urinary Elimination: Goal: Ability to achieve and maintain adequate renal perfusion and functioning will improve Outcome: Progressing

## 2019-06-21 DIAGNOSIS — I480 Paroxysmal atrial fibrillation: Secondary | ICD-10-CM

## 2019-06-21 DIAGNOSIS — N179 Acute kidney failure, unspecified: Secondary | ICD-10-CM | POA: Diagnosis not present

## 2019-06-21 DIAGNOSIS — I959 Hypotension, unspecified: Secondary | ICD-10-CM | POA: Diagnosis not present

## 2019-06-21 LAB — RENAL FUNCTION PANEL
Albumin: 2.4 g/dL — ABNORMAL LOW (ref 3.5–5.0)
Anion gap: 10 (ref 5–15)
BUN: 29 mg/dL — ABNORMAL HIGH (ref 8–23)
CO2: 18 mmol/L — ABNORMAL LOW (ref 22–32)
Calcium: 8.3 mg/dL — ABNORMAL LOW (ref 8.9–10.3)
Chloride: 116 mmol/L — ABNORMAL HIGH (ref 98–111)
Creatinine, Ser: 2.46 mg/dL — ABNORMAL HIGH (ref 0.44–1.00)
GFR calc Af Amer: 23 mL/min — ABNORMAL LOW (ref >60)
GFR calc non Af Amer: 20 mL/min — ABNORMAL LOW (ref >60)
Glucose, Bld: 91 mg/dL (ref 70–99)
Phosphorus: 4.9 mg/dL — ABNORMAL HIGH (ref 2.5–4.6)
Potassium: 4.7 mmol/L (ref 3.5–5.1)
Sodium: 144 mmol/L (ref 135–145)

## 2019-06-21 MED ORDER — MAGNESIUM SULFATE 2 GM/50ML IV SOLN
2.0000 g | Freq: Once | INTRAVENOUS | Status: AC
  Administered 2019-06-21: 2 g via INTRAVENOUS
  Filled 2019-06-21: qty 50

## 2019-06-21 NOTE — Plan of Care (Signed)
  Problem: Bowel/Gastric: Goal: Gastrointestinal status for postoperative course will improve Outcome: Progressing Note: Pt had large BM 06/20/19; orders to d/c NG today     Problem: Cardiac: Goal: Ability to maintain an adequate cardiac output will improve Outcome: Progressing   Problem: Respiratory: Goal: Respiratory status will improve Outcome: Progressing   Problem: Activity: Goal: Risk for activity intolerance will decrease Outcome: Progressing   Problem: Pain Managment: Goal: General experience of comfort will improve Outcome: Progressing

## 2019-06-21 NOTE — Progress Notes (Signed)
Physical Therapy Treatment Patient Details Name: Wendy [name] MRN: [id number] DOB: 27-Jun-1950 Today's Date: 06/21/2019    History of Present Illness Pt is a 69yo female s/p aortobifemoral bypass due to critical R LE ischemia. PMHx: L to R femorofemoral bypass. PMHx: COPD, CAD, HTN, past MI, PVDx, cardiac stents, RT THA, neuropathy, HLD, OSA    PT Comments    Pt making steady progress toward return home.    Follow Up Recommendations  Home health PT;Supervision/Assistance - 24 hour     Equipment Recommendations  None recommended by PT    Recommendations for Other Services       Precautions / Restrictions Precautions Precautions: Fall Restrictions Weight Bearing Restrictions: No    Mobility  Bed Mobility Overal bed mobility: Needs Assistance Bed Mobility: Rolling;Sidelying to Sit Rolling: Min guard Sidelying to sit: Min assist       General bed mobility comments: Assist for pt to pull up on therapist hand to elevate trunk into sitting.  Transfers Overall transfer level: Needs assistance Equipment used: 4-wheeled walker Transfers: Sit to/from Stand Sit to Stand: Min guard         General transfer comment: Assist for safety and lines. Cues for hand placement  Ambulation/Gait Ambulation/Gait assistance: Min guard Gait Distance (Feet): 100 Feet Assistive device: Rolling walker (2 wheeled) Gait Pattern/deviations: Step-through pattern;Decreased stride length;Trunk flexed Gait velocity: decr Gait velocity interpretation: 1.31 - 2.62 ft/sec, indicative of limited community ambulator General Gait Details: Assist for safety and lines.    Stairs             Wheelchair Mobility    Modified Rankin (Stroke Patients Only)       Balance Overall balance assessment: History of Falls;Needs assistance Sitting-balance support: Feet supported;No upper extremity supported Sitting balance-Leahy Scale: Fair     Standing balance support: Bilateral upper  extremity supported;During functional activity Standing balance-Leahy Scale: Poor Standing balance comment: rollator and supervision for static standing                            Cognition Arousal/Alertness: Awake/alert Behavior During Therapy: WFL for tasks assessed/performed Overall Cognitive Status: Within Functional Limits for tasks assessed                                        Exercises      General Comments General comments (skin integrity, edema, etc.): Pt amb on RA. SpO2 not reading during amb but 94% after returned to sitting      Pertinent Vitals/Pain Pain Assessment: Faces Faces Pain Scale: Hurts even more Pain Location: abdomen Pain Descriptors / Indicators: Aching;Sore Pain Intervention(s): Limited activity within patient's tolerance;Monitored during session;PCA encouraged;Repositioned    Home Living                      Prior Function            PT Goals (current goals can now be found in the care plan section) Acute Rehab PT Goals Patient Stated Goal: return home Progress towards PT goals: Progressing toward goals    Frequency    Min 3X/week      PT Plan Current plan remains appropriate    Co-evaluation              AM-PAC PT "6 Clicks" Mobility   Outcome Measure  Help needed turning  from your back to your side while in a flat bed without using bedrails?: A Little Help needed moving from lying on your back to sitting on the side of a flat bed without using bedrails?: A Little Help needed moving to and from a bed to a chair (including a wheelchair)?: A Little Help needed standing up from a chair using your arms (e.g., wheelchair or bedside chair)?: A Little Help needed to walk in hospital room?: A Little Help needed climbing 3-5 steps with a railing? : A Lot 6 Click Score: 17    End of Session Equipment Utilized During Treatment: Gait belt Activity Tolerance: Patient tolerated treatment  well Patient left: in chair;with call bell/phone within reach;with chair alarm set;with family/visitor present Nurse Communication: Mobility status PT Visit Diagnosis: Other abnormalities of gait and mobility (R26.89);Difficulty in walking, not elsewhere classified (R26.2)     Time: [id number] PT Time Calculation (min) (ACUTE ONLY): 25 min  Charges:  $Gait Training: 23-37 mins                     [place] Pager [phone] Office [place] 06/21/2019, 3:15 PM

## 2019-06-21 NOTE — Plan of Care (Signed)
  Problem: Education: Goal: Knowledge of the prescribed therapeutic regimen will improve Outcome: Progressing   Problem: Bowel/Gastric: Goal: Gastrointestinal status for postoperative course will improve Outcome: Progressing   Problem: Cardiac: Goal: Ability to maintain an adequate cardiac output will improve Outcome: Progressing   Problem: Clinical Measurements: Goal: Postoperative complications will be avoided or minimized Outcome: Progressing   Problem: Respiratory: Goal: Respiratory status will improve Outcome: Progressing   Problem: Skin Integrity: Goal: Demonstration of wound healing without infection will improve Outcome: Progressing   Problem: Urinary Elimination: Goal: Ability to achieve and maintain adequate renal perfusion and functioning will improve Outcome: Progressing   Problem: Education: Goal: Knowledge of General Education information will improve Description: Including pain rating scale, medication(s)/side effects and non-pharmacologic comfort measures Outcome: Progressing   Problem: Clinical Measurements: Goal: Will remain free from infection Outcome: Progressing Goal: Diagnostic test results will improve Outcome: Progressing   Problem: Activity: Goal: Risk for activity intolerance will decrease Outcome: Progressing

## 2019-06-21 NOTE — Progress Notes (Signed)
Progress Note  Patient Name: Wendy [name] Date of Encounter: 06/21/2019  Primary Cardiologist: [name], MD   Subjective   She went from afib with intermittent junctional rhythm to steady sinus rhythm around 17:38 yesterday. She has not had any significant pauses since then. We transitioned to phenylephrine yesterday to avoid worsening heart rate, and she is currently trialing off pressors since 6:45 AM. She is sitting in a chair with MAP of 65 on my interview. She had significant relief after bowel movement yesterday, though still endorses abdominal fullness/tenderness. Still using PCA for pain control. No chest pain, breathing is stable.  Inpatient Medications    Scheduled Meds: . Chlorhexidine Gluconate Cloth  6 each Topical Daily  . Chlorhexidine Gluconate Cloth  6 each Topical Q0600  . heparin  5,000 Units Subcutaneous Q8H  . mouth rinse  15 mL Mouth Rinse BID  . morphine   Intravenous Q4H  . mupirocin ointment  1 application Nasal BID  . pantoprazole (PROTONIX) IV  40 mg Intravenous Daily  . sodium chloride flush  10-40 mL Intracatheter Q12H   Continuous Infusions: . sodium chloride 100 mL/hr at 06/21/19 0500  . magnesium sulfate bolus IVPB    . phenylephrine (NEO-SYNEPHRINE) Adult infusion Stopped (06/21/19 0640)   PRN Meds: acetaminophen **OR** acetaminophen, alum & mag hydroxide-simeth, diphenhydrAMINE **OR** diphenhydrAMINE, guaiFENesin-dextromethorphan, hydrALAZINE, metoprolol tartrate, morphine injection, naloxone **AND** sodium chloride flush, ondansetron, phenol, potassium chloride, sodium chloride flush   Vital Signs    Vitals:   06/21/19 0700 06/21/19 0752 06/21/19 0800 06/21/19 0822  BP:   (!) 100/48   Pulse: 96  93   Resp: 17 10 12    Temp:    98.2 F (36.8 C)  TempSrc:      SpO2: 95% 96% 96%   Weight:        Intake/Output Summary (Last 24 hours) at 06/21/2019 0836 Last data filed at 06/21/2019 0745 Gross per 24 hour  Intake 2742.18 ml    Output 1815 ml  Net 927.18 ml   Last 3 Weights 06/21/2019 06/14/2019 06/05/2019  Weight (lbs) 158 lb 1.1 oz 144 lb 6.4 oz 141 lb  Weight (kg) 71.7 kg 65.499 kg 63.957 kg      Telemetry    afib RVR with intermittent junctional rhythm until 17:38 yesterday, brief junctional then has been normal sinus rhythm around 90 BPM since that time - Personally Reviewed  ECG    No new since 4/14, ordered for this AM - Personally Reviewed  Physical Exam   GEN: No acute distress.  NG tube in place Neck: No JVD Cardiac: regular S1 and S2 with appreciable 2/6 systolic murmur heard today Respiratory: Clear to auscultation bilaterally, though poor inspiratory effort GI: Quiet bowel sounds, soft, mildly tender to palpation MS: No edema; No deformity. Neuro:  Nonfocal  Psych: Normal affect   Labs    High Sensitivity Troponin:   Recent Labs  Lab 06/19/19 1239 06/19/19 1435 06/19/19 2115  TROPONINIHS 26* 61* 165*      Chemistry Recent Labs  Lab 06/14/19 1415 06/18/19 0811 06/19/19 0244 06/19/19 1239 06/19/19 2304 06/20/19 1322 06/21/19 0508  NA 140   < > 138   < > 141 144 144  K 4.8   < > 4.2   < > 4.4 4.5 4.7  CL 105   < > 106   < > 109 112* 116*  CO2 26   < > 23   < > 20* 21* 18*  GLUCOSE 92   < >  150*   < > 132* 120* 91  BUN 22   < > 31*   < > 33* 31* 29*  CREATININE 1.17*   < > 2.28*   < > 2.86* 2.81* 2.46*  CALCIUM 9.3   < > 7.9*   < > 7.9* 8.1* 8.3*  PROT 6.6  --  5.0*  --   --   --   --   ALBUMIN 3.2*  --  2.3*  --  3.2* 2.7* 2.4*  AST 13*  --  20  --   --   --   --   ALT 10  --  13  --   --   --   --   ALKPHOS 62  --  38  --   --   --   --   BILITOT 0.6  --  0.5  --   --   --   --   GFRNONAA 48*   < > 22*   < > 16* 17* 20*  GFRAA 56*   < > 25*   < > 19* 19* 23*  ANIONGAP 9   < > 9   < > 12 11 10    < > = values in this interval not displayed.     Hematology Recent Labs  Lab 06/19/19 1239 06/19/19 2304 06/20/19 1322  WBC 5.7 5.6 7.6  RBC 3.04* 2.78* 3.10*  HGB  8.9* 7.9* 9.0*  HCT 27.9* 25.5* 28.8*  MCV 91.8 91.7 92.9  MCH 29.3 28.4 29.0  MCHC 31.9 31.0 31.3  RDW 16.3* 16.5* 16.3*  PLT 158 133* 137*    BNP Recent Labs  Lab 06/19/19 1239  BNP 366.7*     DDimer No results for input(s): DDIMER in the last 168 hours.   Radiology    US RENAL  Result Date: 06/19/2019 CLINICAL DATA:  AK I EXAM: RENAL / URINARY TRACT ULTRASOUND COMPLETE COMPARISON:  CT abdomen pelvis 05/14/2014, abdominal ultrasound 09/15/2013 FINDINGS: Right Kidney: Suboptimal evaluation due to rib shadowing and bowel gas. Renal measurements: 9.6 x 5.4 x 3.7 = volume: 101.5 mL. Mildly increased cortical echogenicity with normal cortical thickness. No concerning renal mass, hydronephrosis or visible urolithiasis. Left Kidney: Renal measurements: 10.7 x 5.1 x 4.4 cm = volume: 124 mL. Mildly increased cortical echogenicity with normal cortical thickness. No concerning renal mass, hydronephrosis or visible urolithiasis. Bladder: Bladder is decompressed by inflated Foley catheter. Other: None. IMPRESSION: Suboptimal evaluation of the right kidney secondary to rib shadowing and bowel gas. Mildly increased bilateral renal cortical echogenicity may reflect medical renal disease. Bladder decompressed by Foley catheter. Electronically Signed   By: Lovena Le M.D.   On: 06/19/2019 19:41    Cardiac Studies   Echo 06/17/19 1. Left ventricular ejection fraction, by estimation, is 55 to 60%. The  left ventricle has normal function. The left ventricle demonstrates  regional wall motion abnormalities (see scoring diagram/findings for  description). Left ventricular diastolic  parameters were normal.  2. Right ventricular systolic function is low normal. The right  ventricular size is normal. There is moderately elevated pulmonary artery  systolic pressure.  3. The mitral valve is normal in structure. Mild mitral valve  regurgitation.  4. The aortic valve is normal in structure. Aortic  valve regurgitation is  not visualized.  5. The inferior vena cava is normal in size with greater than 50%  respiratory variability, suggesting right atrial pressure of 3 mmHg.   Cath 06/05/19  Prox RCA to Mid  RCA lesion is 100% stenosed.  Dist Cx lesion is 80% stenosed.  3rd Mrg lesion is 100% stenosed.  1st Mrg-1 lesion is 100% stenosed.  1st Mrg-2 lesion is 100% stenosed.  SVG graft was not visualized.  Origin to Prox Graft lesion is 100% stenosed.  SVG graft was not visualized.  Origin to Prox Graft lesion is 100% stenosed.  Prox LAD to Mid LAD lesion is 60% stenosed.  1st Diag lesion is 100% stenosed.  LIMA graft was not visualized.  Origin to Dist Graft lesion is 100% stenosed.  1. Stable three vessel CAD s/p 3V CABG with known occlusion of all three grafts.  2. Moderate, calcified mid LAD stenosis that is angiographically improved from cath in 2018.  3. Chronic occlusion proximal RCA. The vessel fills from left to right collaterals.  4. Chronic occlusion obtuse marginal and distal Circumflex.   Recommendations: Continue medical management of CAD. Proceed with planning for LE bypass surgery.   Patient Profile     69 y.o. female with a hx of PAD, CAD status post NSTEMI s/p CABG x3 (SVG-OM, LIMA-LAD, SVG-RCA in 1998) with PCI on 2013and 2015,PAF,hypertension, chronic combined systolic and diastolic heart failure, COPD, GERD, peripheral neuropathy who underwent aortobifemoral bypass and reimplantation of right profunda femoris artery on aortobifemoral limb on 06/18/19 by Dr. Donzetta Matters.  Assessment & Plan    Atrial fibrillation with RVR, with hypotension requiring pressors and intermittent junctional rhythm: -see note from yesterday; received 1 dose of IV digoxin AM 4/15 and then converted to NSR around 17:38 4/15 -if blood pressure support needed, would use phenylephrine or vasopression to avoid driving heart rate -K is 4.7 today. Last Mg 1.8, giving 1 gm  today  Post-aortobifemoral bypass and reimplantation of right profunda femoris artery on aortobifemoral limb on 06/18/19 by Dr. Donzetta Matters, with post operative blood loss anemia -received 1 Unite PRBC 4/15 for Hgb 7.9 given symptoms of chest pain, likely demand ischemia -preop Hgb 11.3 -ileus improving, had BM 4/15, tolerating clear liquids  Known severe CAD, s/p prior CABG -cath 06/05/19 showed stable 3V disease with known occlusion of all 3 bypass grafts -she will have demand ischemia with elevated heart rate or stress. Elevated troponin in line with this. -clopidogrel on hold, restart when able -on imdur, ranolazine as outpatient, on hold given hypotension  Hyperlipidemia, LDL goal <70 -on atorvastatin 80 mg, ezetimibe 10 mg as outpatient, on hold given NPO -restart when able  Paroxysmal atrial fibrillation -CHA2DS2/VAS Stroke Risk Points = 5 -on apixaban as outpatient -when clear from surgical standpoint, would start heparin drip or apixaban (if she is NPO vs. Taking oral meds)  Hypertension: -hypotensive post operatively, on levophed and then phenylephrine for BP support -pressors have been off since 6:45 AM, but MAPs are holding right at 65 -would monitor in the ICU. If her MAP drops consistently below 65, would restart phenylephrine. If she has been off pressors consistently for 12 (though preferably 24) hours, could leave ICU. -on valsartan-HCTZ as outpatient, on hold  Chronic diastolic heart failure (prior systolic heart failure) -most recent EF was preserved -has lasix PRN at home -appears euvolemic today, no diuresis planned  Acute renal dysfunction: Cr 2.46 today, was 1.17 prior to surgery. Peak 2.86, now downtrending. -received blood transfusion 4/15 -avoid nephrotoxic agents -was NPO, now starting clears. All input had been IV  CRITICAL CARE Patient is critically ill with multiple organ systems affected and requires high complexity decision making. Total critical  care time: 35 minutes. This time  includes gathering of history, evaluation of patient's response to treatment, examination of patient, review of laboratory and imaging studies, and coordination with consultants.   For questions or updates, please contact [place] Please consult [email or web address] for contact info under     Signed, Buford Dresser, MD  06/21/2019, 8:36 AM

## 2019-06-21 NOTE — Progress Notes (Signed)
Admit: 06/18/2019 LOS: 3  9F s/p aortobifemoral bypass 4/13; AFib with RVR post op; AKI  Subjective:  Marland Kitchen SCr 2.8 to 3.46, K 4.7, HCO3 18 .  1.5L UOP . Off pressors this AM . OOB to chair . Still has foley cath . No c/o or needs  04/15 0701 - 04/16 0700 In: 2742.2 [I.V.:2381.1; Blood:321.1; NG/GT:40] Out: 1685 [Urine:1515; Emesis/NG output:170]  Filed Weights   06/21/19 0645  Weight: 71.7 kg    Scheduled Meds: . Chlorhexidine Gluconate Cloth  6 each Topical Daily  . Chlorhexidine Gluconate Cloth  6 each Topical Q0600  . heparin  5,000 Units Subcutaneous Q8H  . mouth rinse  15 mL Mouth Rinse BID  . morphine   Intravenous Q4H  . mupirocin ointment  1 application Nasal BID  . pantoprazole (PROTONIX) IV  40 mg Intravenous Daily  . sodium chloride flush  10-40 mL Intracatheter Q12H   Continuous Infusions: . sodium chloride 100 mL/hr at 06/21/19 0500  . magnesium sulfate bolus IVPB    . phenylephrine (NEO-SYNEPHRINE) Adult infusion Stopped (06/21/19 0640)   PRN Meds:.acetaminophen **OR** acetaminophen, alum & mag hydroxide-simeth, diphenhydrAMINE **OR** diphenhydrAMINE, guaiFENesin-dextromethorphan, hydrALAZINE, metoprolol tartrate, morphine injection, naloxone **AND** sodium chloride flush, ondansetron, phenol, potassium chloride, sodium chloride flush  Current Labs: reviewed    Physical Exam:  Blood pressure (!) 100/48, pulse 93, temperature 98.2 F (36.8 C), resp. rate 12, weight 71.7 kg, SpO2 96 %. In chair, NAD RRR CTAB No sig LEE Foley cath present No rashes/lesions R IJ Cordis  A 1. AKI with b/l CKD3 in setting of #2; now nonoliguric and SCr downrtrending 2. S/p aortobifemoral bypass 4/13 3. CAD w/ hx/o CABG 4. AFib, RVR post-op 5. Chronic dCHF 6. Anemia, stable  P . Improving GFR . OK for foley removal, cont strict Is and Os . Daily weights, Daily Renal Panel, Strict I/Os, Avoid nephrotoxins (NSAIDs, judicious IV Contrast)   Righetti Grippe MD 06/21/2019,  9:07 AM  Recent Labs  Lab 06/19/19 2304 06/20/19 1322 06/21/19 0508  NA 141 144 144  K 4.4 4.5 4.7  CL 109 112* 116*  CO2 20* 21* 18*  GLUCOSE 132* 120* 91  BUN 33* 31* 29*  CREATININE 2.86* 2.81* 2.46*  CALCIUM 7.9* 8.1* 8.3*  PHOS 4.9* 4.9* 4.9*   Recent Labs  Lab 06/19/19 1239 06/19/19 2304 06/20/19 1322  WBC 5.7 5.6 7.6  HGB 8.9* 7.9* 9.0*  HCT 27.9* 25.5* 28.8*  MCV 91.8 91.7 92.9  PLT 158 133* 137*

## 2019-06-21 NOTE — Progress Notes (Addendum)
Progress Note    06/21/2019 7:52 AM 3 Days Post-Op  Subjective: She has been out of bed to chair since 0645.  Was able to transition from Levophed to Neo-Synephrine yesterday and her Neo-Synephrine has been off since she has been out of bed.  Her heart rate has been stable and currently blood pressure is also stable.  She continues to complain of epigastric pain.  She had a bowel movement overnight.  Tolerating ice chips.  Her nasogastric tube was clamped most of the day yesterday.  Continues to use PCA for pain control.    Vitals:   06/21/19 0655 06/21/19 0700  BP:    Pulse: 96 96  Resp: 16 17  Temp:    SpO2: 95% 95%    Physical Exam: Cardiac: Right and rhythm are regular this morning Lungs: Clear to auscultation bilaterally Incisions: Abdominal incision and both groin incisions are well approximated without signs of infection or hematoma. Extremities: Normal active range of motion both feet.  Sensation intact.  2+ palpable dorsalis pedis pulses bilaterally Abdomen: Soft, nondistended, active bowel sounds  CBC    Component Value Date/Time   WBC 7.6 06/20/2019 1322   RBC 3.10 (L) 06/20/2019 1322   HGB 9.0 (L) 06/20/2019 1322   HGB 13.4 06/04/2019 1036   HCT 28.8 (L) 06/20/2019 1322   HCT 41.7 06/04/2019 1036   PLT 137 (L) 06/20/2019 1322   PLT 205 06/04/2019 1036   MCV 92.9 06/20/2019 1322   MCV 86 06/04/2019 1036   MCH 29.0 06/20/2019 1322   MCHC 31.3 06/20/2019 1322   RDW 16.3 (H) 06/20/2019 1322   RDW 17.4 (H) 06/04/2019 1036   LYMPHSABS 1.0 09/07/2017 1800   MONOABS 0.4 09/07/2017 1800   EOSABS 0.1 09/07/2017 1800   BASOSABS 0.1 09/07/2017 1800    BMET    Component Value Date/Time   NA 144 06/21/2019 0508   NA 141 06/04/2019 1036   K 4.7 06/21/2019 0508   CL 116 (H) 06/21/2019 0508   CO2 18 (L) 06/21/2019 0508   GLUCOSE 91 06/21/2019 0508   BUN 29 (H) 06/21/2019 0508   BUN 14 06/04/2019 1036   CREATININE 2.46 (H) 06/21/2019 0508   CREATININE 0.96  08/13/2015 0939   CALCIUM 8.3 (L) 06/21/2019 0508   GFRNONAA 20 (L) 06/21/2019 0508   GFRAA 23 (L) 06/21/2019 0508     Intake/Output Summary (Last 24 hours) at 06/21/2019 0752 Last data filed at 06/21/2019 0700 Gross per 24 hour  Intake 2742.18 ml  Output 1685 ml  Net 1057.18 ml    HOSPITAL MEDICATIONS Scheduled Meds: . Chlorhexidine Gluconate Cloth  6 each Topical Daily  . Chlorhexidine Gluconate Cloth  6 each Topical Q0600  . heparin  5,000 Units Subcutaneous Q8H  . mouth rinse  15 mL Mouth Rinse BID  . morphine   Intravenous Q4H  . mupirocin ointment  1 application Nasal BID  . pantoprazole (PROTONIX) IV  40 mg Intravenous Daily  . sodium chloride flush  10-40 mL Intracatheter Q12H   Continuous Infusions: . sodium chloride 100 mL/hr at 06/21/19 0500  . phenylephrine (NEO-SYNEPHRINE) Adult infusion Stopped (06/21/19 0640)   PRN Meds:.acetaminophen **OR** acetaminophen, alum & mag hydroxide-simeth, diphenhydrAMINE **OR** diphenhydrAMINE, guaiFENesin-dextromethorphan, hydrALAZINE, metoprolol tartrate, morphine injection, naloxone **AND** sodium chloride flush, ondansetron, phenol, potassium chloride, sodium chloride flush  Assessment:  69 y.o. female is s/p:  Aortobifemoral bypass.  Lower extremities well perfused.  Bowel function has resumed  History of atrial fibrillation, rapid ventricular weight postoperatively.  Currently hemodynamically stable off vasopressors  Acute kidney injury with improvement in creatinine today.  Good urine output  Blood loss anemia with good response to 1 unit of packed cells   3 Days Post-Op  Plan: -Plan of care discussed with Dr. Donzetta Matters.  We will discontinue her nasogastric tube and allow clear liquids today.  Prefer to continue monitoring in intensive care unit another 24 hours  Leave Foley in until nephrology evaluates and will follow their recommendations.  Repeat labs in a.m.  -DVT prophylaxis: Heparin [place]   Risa Grill,  PA-C [facility] [phone] 06/21/2019  7:52 AM    I have interviewed and examined patient with PA and agree with assessment and plan above.  Nazir Hacker C. Donzetta Matters, MD [facility] [place] Office: [phone] Pager: [phone]

## 2019-06-22 DIAGNOSIS — N289 Disorder of kidney and ureter, unspecified: Secondary | ICD-10-CM | POA: Diagnosis not present

## 2019-06-22 DIAGNOSIS — I9589 Other hypotension: Secondary | ICD-10-CM | POA: Diagnosis not present

## 2019-06-22 DIAGNOSIS — D649 Anemia, unspecified: Secondary | ICD-10-CM | POA: Diagnosis not present

## 2019-06-22 DIAGNOSIS — I4891 Unspecified atrial fibrillation: Secondary | ICD-10-CM | POA: Diagnosis not present

## 2019-06-22 LAB — CBC
HCT: 26 % — ABNORMAL LOW (ref 36.0–46.0)
HCT: 29.8 % — ABNORMAL LOW (ref 36.0–46.0)
Hemoglobin: 7.7 g/dL — ABNORMAL LOW (ref 12.0–15.0)
Hemoglobin: 9.3 g/dL — ABNORMAL LOW (ref 12.0–15.0)
MCH: 28.5 pg (ref 26.0–34.0)
MCH: 29.2 pg (ref 26.0–34.0)
MCHC: 29.6 g/dL — ABNORMAL LOW (ref 30.0–36.0)
MCHC: 31.2 g/dL (ref 30.0–36.0)
MCV: 96.3 fL (ref 80.0–100.0)
MCV: 93.4 fL (ref 80.0–100.0)
Platelets: 122 10*3/uL — ABNORMAL LOW (ref 150–400)
Platelets: 141 10*3/uL — ABNORMAL LOW (ref 150–400)
RBC: 2.7 MIL/uL — ABNORMAL LOW (ref 3.87–5.11)
RBC: 3.19 MIL/uL — ABNORMAL LOW (ref 3.87–5.11)
RDW: 16.6 % — ABNORMAL HIGH (ref 11.5–15.5)
RDW: 16 % — ABNORMAL HIGH (ref 11.5–15.5)
WBC: 4.1 10*3/uL (ref 4.0–10.5)
WBC: 5.6 10*3/uL (ref 4.0–10.5)
nRBC: 0 % (ref 0.0–0.2)
nRBC: 0 % (ref 0.0–0.2)

## 2019-06-22 LAB — RENAL FUNCTION PANEL
Albumin: 2.1 g/dL — ABNORMAL LOW (ref 3.5–5.0)
Anion gap: 8 (ref 5–15)
BUN: 25 mg/dL — ABNORMAL HIGH (ref 8–23)
CO2: 19 mmol/L — ABNORMAL LOW (ref 22–32)
Calcium: 8.1 mg/dL — ABNORMAL LOW (ref 8.9–10.3)
Chloride: 118 mmol/L — ABNORMAL HIGH (ref 98–111)
Creatinine, Ser: 2.14 mg/dL — ABNORMAL HIGH (ref 0.44–1.00)
GFR calc Af Amer: 27 mL/min — ABNORMAL LOW (ref >60)
GFR calc non Af Amer: 23 mL/min — ABNORMAL LOW (ref >60)
Glucose, Bld: 93 mg/dL (ref 70–99)
Phosphorus: 4.1 mg/dL (ref 2.5–4.6)
Potassium: 4.3 mmol/L (ref 3.5–5.1)
Sodium: 145 mmol/L (ref 135–145)

## 2019-06-22 LAB — BPAM RBC
Blood Product Expiration Date: 202105072359
Blood Product Expiration Date: 202105072359
Blood Product Expiration Date: 202105072359
Blood Product Expiration Date: 202105072359
ISSUE DATE / TIME: 202104130703
ISSUE DATE / TIME: 202104130703
ISSUE DATE / TIME: 202104130703
ISSUE DATE / TIME: 202104150839
Unit Type and Rh: 6200
Unit Type and Rh: 6200
Unit Type and Rh: 6200
Unit Type and Rh: 6200

## 2019-06-22 LAB — BASIC METABOLIC PANEL
Anion gap: 11 (ref 5–15)
BUN: 25 mg/dL — ABNORMAL HIGH (ref 8–23)
CO2: 17 mmol/L — ABNORMAL LOW (ref 22–32)
Calcium: 8.2 mg/dL — ABNORMAL LOW (ref 8.9–10.3)
Chloride: 115 mmol/L — ABNORMAL HIGH (ref 98–111)
Creatinine, Ser: 2.07 mg/dL — ABNORMAL HIGH (ref 0.44–1.00)
GFR calc Af Amer: 28 mL/min — ABNORMAL LOW (ref >60)
GFR calc non Af Amer: 24 mL/min — ABNORMAL LOW (ref >60)
Glucose, Bld: 100 mg/dL — ABNORMAL HIGH (ref 70–99)
Potassium: 4.5 mmol/L (ref 3.5–5.1)
Sodium: 143 mmol/L (ref 135–145)

## 2019-06-22 LAB — TYPE AND SCREEN
ABO/RH(D): A POS
Antibody Screen: NEGATIVE
Unit division: 0
Unit division: 0
Unit division: 0
Unit division: 0

## 2019-06-22 LAB — PREPARE RBC (CROSSMATCH)

## 2019-06-22 MED ORDER — DIGOXIN 0.25 MG/ML IJ SOLN
0.1250 mg | Freq: Every day | INTRAMUSCULAR | Status: DC
  Administered 2019-06-22 – 2019-06-23 (×2): 0.125 mg via INTRAVENOUS
  Filled 2019-06-22 (×2): qty 2

## 2019-06-22 MED ORDER — SODIUM CHLORIDE 0.9% IV SOLUTION: Freq: Once | INTRAVENOUS | Status: DC

## 2019-06-22 NOTE — Progress Notes (Signed)
[facility] [place]  Subjective  -tolerated NG being removed.  Tolerated clear liquids.  Sitting in chair in ICU.  Afib with RVR overnight.   Objective (!) 110/56 (!) 118 98.3 F (36.8 C) (Oral) 15 94%  Intake/Output Summary (Last 24 hours) at 06/22/2019 1001 Last data filed at 06/22/2019 0900 Gross per 24 hour  Intake 2205.16 ml  Output 1070 ml  Net 1135.16 ml    Midline incision and bilateral groin incisions c/d/i  Palpable DP pulses bilateral lower extremities  Laboratory Lab Results: Recent Labs    06/20/19 1322 06/22/19 0341  WBC 7.6 4.1  HGB 9.0* 7.7*  HCT 28.8* 26.0*  PLT 137* 122*   BMET Recent Labs    06/21/19 0508 06/22/19 0341  NA 144 145  K 4.7 4.3  CL 116* 118*  CO2 18* 19*  GLUCOSE 91 93  BUN 29* 25*  CREATININE 2.46* 2.14*  CALCIUM 8.3* 8.1*    COAG Lab Results  Component Value Date   INR 1.4 (H) 06/18/2019   INR 1.6 (H) 06/14/2019   INR 1.1 (A) 10/17/2017   No results found for: PTT  Assessment/Planning: POD #4 status post aortobifemoral bypass.  Neuro: GCS 15.  Neuro intact. Cardiovascular: A. fib with RVR.  Appreciate cardiology input.  Start digoxin today.  Hemoglobin 7.7 will transfuse 1 unit.  We will hold on starting heparin pending response to blood transfusion. Pulmonary: Nasal cannula 1 L GI: NG out.  Tolerating clear liquids.  Will leave on clear liquids today and states she is passing gas. Renal: Urine output 1100 last 24 hours.  Creatinine from 2.46- to 2.14 improving.  Nephrology recommends stopping IVF. FEN: Potassium okay.  Saline lock fluids per nephrology.  Dispo: We will leave in ICU again today given A. fib with RVR and previous issues with blood pressure particularly hypotension.  Cardiology want to start digoxin today and will monitor.  Will transfuse 1 upRBC.  Marty Heck 06/22/2019 10:01 AM --

## 2019-06-22 NOTE — Plan of Care (Signed)
Afib overnight, controlled rate, BP stable off neo gtt. Morphine PCA for pain control. Foley removed, voiding with purewick. Tolerating clears, limited intake/appetite. Kidney function/labs continue to improve. Hgb 7.7 this am, no active bleeding noted.   Problem: Education: Goal: Knowledge of the prescribed therapeutic regimen will improve Outcome: Progressing   Problem: Bowel/Gastric: Goal: Gastrointestinal status for postoperative course will improve Outcome: Progressing   Problem: Respiratory: Goal: Respiratory status will improve Outcome: Progressing   Problem: Skin Integrity: Goal: Demonstration of wound healing without infection will improve Outcome: Progressing   Problem: Urinary Elimination: Goal: Ability to achieve and maintain adequate renal perfusion and functioning will improve Outcome: Progressing   Problem: Activity: Goal: Risk for activity intolerance will decrease Outcome: Progressing   Problem: Coping: Goal: Level of anxiety will decrease Outcome: Progressing   Problem: Elimination: Goal: Will not experience complications related to bowel motility Outcome: Progressing Goal: Will not experience complications related to urinary retention Outcome: Progressing

## 2019-06-22 NOTE — Progress Notes (Signed)
Progress Note  Patient Name: Wendy [name] Date of Encounter: 06/22/2019  Primary Cardiologist: [name], MD   Subjective   She went back into afib at 2125 last night. Blood pressure stable this AM, off pressors. Afib rates have been in the low 100s. She is sitting in the chair this AM, feels tired. No pain. Tolerating clears.  Inpatient Medications    Scheduled Meds: . Chlorhexidine Gluconate Cloth  6 each Topical Daily  . Chlorhexidine Gluconate Cloth  6 each Topical Q0600  . digoxin  0.125 mg Intravenous Daily  . heparin  5,000 Units Subcutaneous Q8H  . mouth rinse  15 mL Mouth Rinse BID  . morphine   Intravenous Q4H  . mupirocin ointment  1 application Nasal BID  . pantoprazole (PROTONIX) IV  40 mg Intravenous Daily  . sodium chloride flush  10-40 mL Intracatheter Q12H   Continuous Infusions: . phenylephrine (NEO-SYNEPHRINE) Adult infusion Stopped (06/21/19 1301)   PRN Meds: acetaminophen **OR** acetaminophen, alum & mag hydroxide-simeth, diphenhydrAMINE **OR** diphenhydrAMINE, guaiFENesin-dextromethorphan, hydrALAZINE, metoprolol tartrate, morphine injection, naloxone **AND** sodium chloride flush, ondansetron, phenol, potassium chloride, sodium chloride flush   Vital Signs    Vitals:   06/22/19 0615 06/22/19 0700 06/22/19 0757 06/22/19 0803  BP:  (!) 110/56    Pulse:  (!) 118    Resp:  13 15   Temp:    98.3 F (36.8 C)  TempSrc:    Oral  SpO2:  99% 94%   Weight: 75.2 kg       Intake/Output Summary (Last 24 hours) at 06/22/2019 0932 Last data filed at 06/22/2019 0700 Gross per 24 hour  Intake 2460.25 ml  Output 870 ml  Net 1590.25 ml   Last 3 Weights 06/22/2019 06/21/2019 06/14/2019  Weight (lbs) 165 lb 12.6 oz 158 lb 1.1 oz 144 lb 6.4 oz  Weight (kg) 75.2 kg 71.7 kg 65.499 kg      Telemetry    Went from NSR back to afib last night at 2125. Rates have been low 100s- Personally Reviewed  ECG    NSR 4/16 - Personally Reviewed  Physical Exam    GEN: tired, ill appearing, sitting in chair with [place] in place HEENT: Normal, moist mucous membranes NECK: No JVD CARDIAC: irregularly irregular rhythm, normal S1 and S2, no rubs or gallops. 2/6 SM VASCULAR: Radial pulses 2+ bilaterally.  RESPIRATORY:  Clear to auscultation though poor inspiratory effort ABDOMEN: Soft, non-tender, non-distended, quiet bowel sounds MUSCULOSKELETAL:  Moves all 4 limbs independently SKIN: Warm and dry, no edema NEUROLOGIC:  Nonfocal. PSYCHIATRIC:  Normal affect   Labs    High Sensitivity Troponin:   Recent Labs  Lab 06/19/19 1239 06/19/19 1435 06/19/19 2115  TROPONINIHS 26* 61* 165*      Chemistry Recent Labs  Lab 06/19/19 0244 06/19/19 1239 06/20/19 1322 06/21/19 0508 06/22/19 0341  NA 138   < > 144 144 145  K 4.2   < > 4.5 4.7 4.3  CL 106   < > 112* 116* 118*  CO2 23   < > 21* 18* 19*  GLUCOSE 150*   < > 120* 91 93  BUN 31*   < > 31* 29* 25*  CREATININE 2.28*   < > 2.81* 2.46* 2.14*  CALCIUM 7.9*   < > 8.1* 8.3* 8.1*  PROT 5.0*  --   --   --   --   ALBUMIN 2.3*   < > 2.7* 2.4* 2.1*  AST 20  --   --   --   --  ALT 13  --   --   --   --   ALKPHOS 38  --   --   --   --   BILITOT 0.5  --   --   --   --   GFRNONAA 22*   < > 17* 20* 23*  GFRAA 25*   < > 19* 23* 27*  ANIONGAP 9   < > 11 10 8    < > = values in this interval not displayed.     Hematology Recent Labs  Lab 06/19/19 2304 06/20/19 1322 06/22/19 0341  WBC 5.6 7.6 4.1  RBC 2.78* 3.10* 2.70*  HGB 7.9* 9.0* 7.7*  HCT 25.5* 28.8* 26.0*  MCV 91.7 92.9 96.3  MCH 28.4 29.0 28.5  MCHC 31.0 31.3 29.6*  RDW 16.5* 16.3* 16.6*  PLT 133* 137* 122*    BNP Recent Labs  Lab 06/19/19 1239  BNP 366.7*     DDimer No results for input(s): DDIMER in the last 168 hours.   Radiology    No results found.  Cardiac Studies   Echo 06/17/19 1. Left ventricular ejection fraction, by estimation, is 55 to 60%. The  left ventricle has normal function. The left ventricle  demonstrates  regional wall motion abnormalities (see scoring diagram/findings for  description). Left ventricular diastolic  parameters were normal.  2. Right ventricular systolic function is low normal. The right  ventricular size is normal. There is moderately elevated pulmonary artery  systolic pressure.  3. The mitral valve is normal in structure. Mild mitral valve  regurgitation.  4. The aortic valve is normal in structure. Aortic valve regurgitation is  not visualized.  5. The inferior vena cava is normal in size with greater than 50%  respiratory variability, suggesting right atrial pressure of 3 mmHg.   Cath 06/05/19  Prox RCA to Mid RCA lesion is 100% stenosed.  Dist Cx lesion is 80% stenosed.  3rd Mrg lesion is 100% stenosed.  1st Mrg-1 lesion is 100% stenosed.  1st Mrg-2 lesion is 100% stenosed.  SVG graft was not visualized.  Origin to Prox Graft lesion is 100% stenosed.  SVG graft was not visualized.  Origin to Prox Graft lesion is 100% stenosed.  Prox LAD to Mid LAD lesion is 60% stenosed.  1st Diag lesion is 100% stenosed.  LIMA graft was not visualized.  Origin to Dist Graft lesion is 100% stenosed.  1. Stable three vessel CAD s/p 3V CABG with known occlusion of all three grafts.  2. Moderate, calcified mid LAD stenosis that is angiographically improved from cath in 2018.  3. Chronic occlusion proximal RCA. The vessel fills from left to right collaterals.  4. Chronic occlusion obtuse marginal and distal Circumflex.   Recommendations: Continue medical management of CAD. Proceed with planning for LE bypass surgery.   Patient Profile     69 y.o. female with a hx of PAD, CAD status post NSTEMI s/p CABG x3 (SVG-OM, LIMA-LAD, SVG-RCA in 1998) with PCI on 2013and 2015,PAF,hypertension, chronic combined systolic and diastolic heart failure, COPD, GERD, peripheral neuropathy who underwent aortobifemoral bypass and reimplantation of right profunda  femoris artery on aortobifemoral limb on 06/18/19 by Dr. Donzetta Matters.  Assessment & Plan    Atrial fibrillation with RVR -previously this admission, afib RVR associated with hypotension requiring pressors and intermittent junctional rhythm: -was in NSR again until 2125 4/16, now back in afib RVR. No significant junctional rhythms currently -BP stable, but given recent need for pressors, will start back with  digoxin as she responded well to this before. If BP remains stable, can start low dose metoprolol as well and titrate as BP allows -if blood pressure support needed, would use phenylephrine or vasopression to avoid driving heart rate -keep K>4, Mg >2  Post-aortobifemoral bypass and reimplantation of right profunda femoris artery on aortobifemoral limb on 06/18/19 by Dr. Donzetta Matters, with post operative blood loss anemia -received 1 Unite PRBC 4/15 for Hgb 7.9 given symptoms of chest pain, likely demand ischemia -Hgb this AM 7.7, platelets 122. Hgb down from 9.0 yesterday. Defer to primary team on tranfusion, denies chest pain. -preop Hgb 11.3 -ileus improving, had BM 4/15, tolerating clear liquids  Known severe CAD, s/p prior CABG -cath 06/05/19 showed stable 3V disease with known occlusion of all 3 bypass grafts -she will have demand ischemia with elevated heart rate or stress. Elevated troponin in line with this. -clopidogrel on hold, restart when able -on imdur, ranolazine as outpatient, on hold given hypotension  Hyperlipidemia, LDL goal <70 -on atorvastatin 80 mg, ezetimibe 10 mg as outpatient, on hold given recent ileus -restart when able  Paroxysmal atrial fibrillation -CHA2DS2/VAS Stroke Risk Points = 5 -on apixaban as outpatient -when clear from surgical standpoint, would start heparin drip or apixaban (if she is NPO vs. Taking oral meds). With anemia and thrombocytopenia, reasonable to hold until these numbers improve  Hypertension: -hypotensive post operatively, required levophed  and then phenylephrine for BP support -BP holding this AM without pressors despite afib. If afib speeds up, may require BP support again. -on valsartan-HCTZ as outpatient, on hold  Chronic diastolic heart failure (prior systolic heart failure) -most recent EF was preserved -has lasix PRN at home -appears euvolemic today, no diuresis planned  Acute renal dysfunction: Cr 2.14 today, was 1.17 prior to surgery. Peak 2.86, now downtrending. -received blood transfusion 4/15 -avoid nephrotoxic agents -was NPO, now clears.   CRITICAL CARE Patient is critically ill with multiple organ systems affected and requires high complexity decision making. Total critical care time: 35 minutes. This time includes gathering of history, evaluation of patient's response to treatment, examination of patient, review of laboratory and imaging studies, and coordination with consultants.   For questions or updates, please contact [place] Please consult [email or web address] for contact info under     Signed, Buford Dresser, MD  06/22/2019, 9:32 AM

## 2019-06-22 NOTE — Progress Notes (Signed)
Admit: 06/18/2019 LOS: 4  64F s/p aortobifemoral bypass 4/13; AFib with RVR post op; AKI  Subjective:  . 1L UOP, foley removed . SCR further improved to 2.14 . K ok, HCO3 19 . NS @ 100/h . OOB to chair, no c/o tol PO   04/16 0701 - 04/17 0700 In: 2160.2 [P.O.:120; I.V.:1990.4; IV Piggyback:49.8] Out: 1010 [Urine:1010]  Filed Weights   06/21/19 0645  Weight: 71.7 kg    Scheduled Meds: . Chlorhexidine Gluconate Cloth  6 each Topical Daily  . Chlorhexidine Gluconate Cloth  6 each Topical Q0600  . heparin  5,000 Units Subcutaneous Q8H  . mouth rinse  15 mL Mouth Rinse BID  . morphine   Intravenous Q4H  . mupirocin ointment  1 application Nasal BID  . pantoprazole (PROTONIX) IV  40 mg Intravenous Daily  . sodium chloride flush  10-40 mL Intracatheter Q12H   Continuous Infusions: . sodium chloride 100 mL/hr at 06/22/19 0400  . phenylephrine (NEO-SYNEPHRINE) Adult infusion Stopped (06/21/19 1301)   PRN Meds:.acetaminophen **OR** acetaminophen, alum & mag hydroxide-simeth, diphenhydrAMINE **OR** diphenhydrAMINE, guaiFENesin-dextromethorphan, hydrALAZINE, metoprolol tartrate, morphine injection, naloxone **AND** sodium chloride flush, ondansetron, phenol, potassium chloride, sodium chloride flush  Current Labs: reviewed    Physical Exam:  Blood pressure (!) 97/40, pulse (!) 103, temperature 97.9 F (36.6 C), temperature source Oral, resp. rate 12, weight 71.7 kg, SpO2 97 %. In chair, NAD RRR CTAB No sig LEE Foley cath present No rashes/lesions R IJ Cordis  A 1. AKI with b/l CKD3 in setting of #2; now nonoliguric and SCr downrtrending 2. S/p aortobifemoral bypass 4/13 3. CAD w/ hx/o CABG 4. AFib, RVR post-op 5. Chronic dCHF 6. Anemia,   P . Improving GFR . Stop NS IVFs . Daily weights, Daily Renal Panel, Strict I/Os, Avoid nephrotoxins (NSAIDs, judicious IV Contrast)   Kosier Grippe MD 06/22/2019, 6:58 AM  Recent Labs  Lab 06/20/19 1322 06/21/19 0508  06/22/19 0341  NA 144 144 145  K 4.5 4.7 4.3  CL 112* 116* 118*  CO2 21* 18* 19*  GLUCOSE 120* 91 93  BUN 31* 29* 25*  CREATININE 2.81* 2.46* 2.14*  CALCIUM 8.1* 8.3* 8.1*  PHOS 4.9* 4.9* 4.1   Recent Labs  Lab 06/19/19 2304 06/20/19 1322 06/22/19 0341  WBC 5.6 7.6 4.1  HGB 7.9* 9.0* 7.7*  HCT 25.5* 28.8* 26.0*  MCV 91.7 92.9 96.3  PLT 133* 137* 122*

## 2019-06-22 NOTE — Progress Notes (Signed)
1000 Blood delayed r/t pt needs new T&S  1200 Blood further delayed r/t pt has antibodies  1800 1u PRBC infusing  I think this pt's PCA dosing may be inappropriate, but not in a way that would show through documentation. She's reporting pain of 7 consistently, but falling asleep in the middle of purposeful movements, and the etCO2 and RR alarms regularly lock out the pump for decreased resp. status. She does wake easily and is able to interact appropriately, so her POSS scores aren't triggering any warnings. I do think the PCA provides much needed autonomy for her, but have asked provider to consider a lower bolus or a longer lockout (currently 1.5mg  q66min max 7.5hr)

## 2019-06-23 DIAGNOSIS — K9189 Other postprocedural complications and disorders of digestive system: Secondary | ICD-10-CM

## 2019-06-23 DIAGNOSIS — K567 Ileus, unspecified: Secondary | ICD-10-CM

## 2019-06-23 DIAGNOSIS — I7409 Other arterial embolism and thrombosis of abdominal aorta: Secondary | ICD-10-CM | POA: Diagnosis not present

## 2019-06-23 DIAGNOSIS — I480 Paroxysmal atrial fibrillation: Secondary | ICD-10-CM | POA: Diagnosis not present

## 2019-06-23 LAB — RENAL FUNCTION PANEL
Albumin: 2 g/dL — ABNORMAL LOW (ref 3.5–5.0)
Anion gap: 8 (ref 5–15)
BUN: 19 mg/dL (ref 8–23)
CO2: 19 mmol/L — ABNORMAL LOW (ref 22–32)
Calcium: 8.3 mg/dL — ABNORMAL LOW (ref 8.9–10.3)
Chloride: 117 mmol/L — ABNORMAL HIGH (ref 98–111)
Creatinine, Ser: 1.77 mg/dL — ABNORMAL HIGH (ref 0.44–1.00)
GFR calc Af Amer: 34 mL/min — ABNORMAL LOW (ref >60)
GFR calc non Af Amer: 29 mL/min — ABNORMAL LOW (ref >60)
Glucose, Bld: 98 mg/dL (ref 70–99)
Phosphorus: 3.8 mg/dL (ref 2.5–4.6)
Potassium: 4.1 mmol/L (ref 3.5–5.1)
Sodium: 144 mmol/L (ref 135–145)

## 2019-06-23 LAB — HEPARIN LEVEL (UNFRACTIONATED): Heparin Unfractionated: 0.21 IU/mL — ABNORMAL LOW (ref 0.30–0.70)

## 2019-06-23 MED ORDER — METOPROLOL TARTRATE 12.5 MG HALF TABLET
12.5000 mg | ORAL_TABLET | Freq: Two times a day (BID) | ORAL | Status: DC
  Administered 2019-06-23 – 2019-06-24 (×3): 12.5 mg via ORAL
  Filled 2019-06-23 (×3): qty 1

## 2019-06-23 MED ORDER — HEPARIN (PORCINE) 25000 UT/250ML-% IV SOLN
1200.0000 [IU]/h | INTRAVENOUS | Status: DC
  Administered 2019-06-23: 900 [IU]/h via INTRAVENOUS
  Administered 2019-06-24: 1200 [IU]/h via INTRAVENOUS
  Filled 2019-06-23 (×2): qty 250

## 2019-06-23 MED ORDER — OXYCODONE-ACETAMINOPHEN 5-325 MG PO TABS
1.0000 | ORAL_TABLET | ORAL | Status: DC | PRN
  Administered 2019-06-23: 2 via ORAL
  Filled 2019-06-23: qty 2

## 2019-06-23 NOTE — Progress Notes (Signed)
Progress Note  Patient Name: Wendy [name] Date of Encounter: 06/23/2019  Primary Cardiologist: [name], MD   Subjective   Currently sinus rhythm in the 90s, IJ line being removed. Went back into NSR around 8PM last night. Fatigued but gradually feeling better. Tolerating PO.  Inpatient Medications    Scheduled Meds: . sodium chloride   Intravenous Once  . Chlorhexidine Gluconate Cloth  6 each Topical Daily  . Chlorhexidine Gluconate Cloth  6 each Topical Q0600  . digoxin  0.125 mg Intravenous Daily  . heparin  5,000 Units Subcutaneous Q8H  . mouth rinse  15 mL Mouth Rinse BID  . metoprolol tartrate  12.5 mg Oral BID  . morphine   Intravenous Q4H  . mupirocin ointment  1 application Nasal BID  . pantoprazole (PROTONIX) IV  40 mg Intravenous Daily  . sodium chloride flush  10-40 mL Intracatheter Q12H   Continuous Infusions: . phenylephrine (NEO-SYNEPHRINE) Adult infusion Stopped (06/21/19 1301)   PRN Meds: acetaminophen **OR** acetaminophen, alum & mag hydroxide-simeth, diphenhydrAMINE **OR** diphenhydrAMINE, guaiFENesin-dextromethorphan, hydrALAZINE, metoprolol tartrate, morphine injection, naloxone **AND** sodium chloride flush, ondansetron, phenol, potassium chloride, sodium chloride flush   Vital Signs    Vitals:   06/23/19 0800 06/23/19 0830 06/23/19 0900 06/23/19 1000  BP: 126/75 129/72 129/72 125/77  Pulse: 93 92 91 93  Resp: 13 14 13 16   Temp:      TempSrc:      SpO2: 98% 97% 98% 96%  Weight:        Intake/Output Summary (Last 24 hours) at 06/23/2019 1048 Last data filed at 06/23/2019 0900 Gross per 24 hour  Intake 385.5 ml  Output 1050 ml  Net -664.5 ml   Last 3 Weights 06/23/2019 06/22/2019 06/21/2019  Weight (lbs) 175 lb 0.7 oz 165 lb 12.6 oz 158 lb 1.1 oz  Weight (kg) 79.4 kg 75.2 kg 71.7 kg      Telemetry    Currently sinus in the 90 bpm range. Went from afib to sinus around 8 PM last night- Personally Reviewed  ECG    NSR 4/16 -  Personally Reviewed  Physical Exam   GEN: Sitting in bed, appears more comfortable, no nasal cannula HEENT: Normal, moist mucous membranes NECK: No JVD visible CARDIAC: regular rhythm, normal S1 and S2, no rubs or gallops. 2/6 systolic murmur. VASCULAR: Radial and DP pulses 2+ bilaterally. RESPIRATORY:  Clear to auscultation without rales, wheezing or rhonchi  ABDOMEN: Soft, non-tender, non-distended, +bowel sounds MUSCULOSKELETAL:  Moves all 4 limbs independently SKIN: Warm and dry, no edema NEUROLOGIC:  No focal neuro deficits noted. PSYCHIATRIC:  Normal affect   Labs    High Sensitivity Troponin:   Recent Labs  Lab 06/19/19 1239 06/19/19 1435 06/19/19 2115  TROPONINIHS 26* 61* 165*      Chemistry Recent Labs  Lab 06/19/19 0244 06/19/19 1239 06/21/19 0508 06/21/19 0508 06/22/19 0341 06/22/19 1616 06/23/19 0448  NA 138   < > 144   < > 145 143 144  K 4.2   < > 4.7   < > 4.3 4.5 4.1  CL 106   < > 116*   < > 118* 115* 117*  CO2 23   < > 18*   < > 19* 17* 19*  GLUCOSE 150*   < > 91   < > 93 100* 98  BUN 31*   < > 29*   < > 25* 25* 19  CREATININE 2.28*   < > 2.46*   < >  2.14* 2.07* 1.77*  CALCIUM 7.9*   < > 8.3*   < > 8.1* 8.2* 8.3*  PROT 5.0*  --   --   --   --   --   --   ALBUMIN 2.3*   < > 2.4*  --  2.1*  --  2.0*  AST 20  --   --   --   --   --   --   ALT 13  --   --   --   --   --   --   ALKPHOS 38  --   --   --   --   --   --   BILITOT 0.5  --   --   --   --   --   --   GFRNONAA 22*   < > 20*   < > 23* 24* 29*  GFRAA 25*   < > 23*   < > 27* 28* 34*  ANIONGAP 9   < > 10   < > 8 11 8    < > = values in this interval not displayed.     Hematology Recent Labs  Lab 06/20/19 1322 06/22/19 0341 06/22/19 2243  WBC 7.6 4.1 5.6  RBC 3.10* 2.70* 3.19*  HGB 9.0* 7.7* 9.3*  HCT 28.8* 26.0* 29.8*  MCV 92.9 96.3 93.4  MCH 29.0 28.5 29.2  MCHC 31.3 29.6* 31.2  RDW 16.3* 16.6* 16.0*  PLT 137* 122* 141*    BNP Recent Labs  Lab 06/19/19 1239  BNP 366.7*       DDimer No results for input(s): DDIMER in the last 168 hours.   Radiology    No results found.  Cardiac Studies   Echo 06/17/19 1. Left ventricular ejection fraction, by estimation, is 55 to 60%. The  left ventricle has normal function. The left ventricle demonstrates  regional wall motion abnormalities (see scoring diagram/findings for  description). Left ventricular diastolic  parameters were normal.  2. Right ventricular systolic function is low normal. The right  ventricular size is normal. There is moderately elevated pulmonary artery  systolic pressure.  3. The mitral valve is normal in structure. Mild mitral valve  regurgitation.  4. The aortic valve is normal in structure. Aortic valve regurgitation is  not visualized.  5. The inferior vena cava is normal in size with greater than 50%  respiratory variability, suggesting right atrial pressure of 3 mmHg.   Cath 06/05/19  Prox RCA to Mid RCA lesion is 100% stenosed.  Dist Cx lesion is 80% stenosed.  3rd Mrg lesion is 100% stenosed.  1st Mrg-1 lesion is 100% stenosed.  1st Mrg-2 lesion is 100% stenosed.  SVG graft was not visualized.  Origin to Prox Graft lesion is 100% stenosed.  SVG graft was not visualized.  Origin to Prox Graft lesion is 100% stenosed.  Prox LAD to Mid LAD lesion is 60% stenosed.  1st Diag lesion is 100% stenosed.  LIMA graft was not visualized.  Origin to Dist Graft lesion is 100% stenosed.  1. Stable three vessel CAD s/p 3V CABG with known occlusion of all three grafts.  2. Moderate, calcified mid LAD stenosis that is angiographically improved from cath in 2018.  3. Chronic occlusion proximal RCA. The vessel fills from left to right collaterals.  4. Chronic occlusion obtuse marginal and distal Circumflex.   Recommendations: Continue medical management of CAD. Proceed with planning for LE bypass surgery.   Patient Profile  70 y.o. female with a hx of PAD, CAD status  post NSTEMI s/p CABG x3 (SVG-OM, LIMA-LAD, SVG-RCA in 1998) with PCI on 2013and 2015,PAF,hypertension, chronic combined systolic and diastolic heart failure, COPD, GERD, peripheral neuropathy who underwent aortobifemoral bypass and reimplantation of right profunda femoris artery on aortobifemoral limb on 06/18/19 by Dr. Donzetta Matters.  Assessment & Plan    Atrial fibrillation with RVR -previously this admission, afib RVR associated with hypotension requiring pressors and intermittent junctional rhythm. Has been in and out of afib post op. Responds well to digoxin. -keep K>4, Mg >2 -will start low dose metoprolol, 12.5 mg BID. Titrate up to home dose when able, 50 mg BID -will stop IV digoxin after today  Post-aortobifemoral bypass and reimplantation of right profunda femoris artery on aortobifemoral limb on 06/18/19 by Dr. Donzetta Matters, with post operative blood loss anemia -received 1 Unite PRBC 4/15 and another unit 4.17. -Hgb this AM 9.3, platelets 141 -preop Hgb 11.3 -ileus improving, had BM 4/15, tolerating clear liquids  Known severe CAD, s/p prior CABG -cath 06/05/19 showed stable 3V disease with known occlusion of all 3 bypass grafts -she will have demand ischemia with elevated heart rate or stress. Elevated troponin in line with this. -clopidogrel on hold, restart when able -on imdur, ranolazine as outpatient, on hold given recent hypotension  Hyperlipidemia, LDL goal <70 -on atorvastatin 80 mg, ezetimibe 10 mg as outpatient, on hold given recent ileus -restart when able  Paroxysmal atrial fibrillation -CHA2DS2/VAS Stroke Risk Points = 5 -on apixaban as outpatient -when clear from surgical standpoint, would start heparin drip or apixaban (if she is NPO vs. Taking oral meds). With anemia and thrombocytopenia, reasonable to hold until these numbers improve  Hypertension: -hypotensive post operatively, required levophed and then phenylephrine for BP support -BP improved this AM. Started  oral metoprolol, as above -on valsartan-HCTZ as outpatient, on hold  Chronic diastolic heart failure (prior systolic heart failure) -most recent EF was preserved -has lasix PRN at home -appears euvolemic today, no diuresis planned  Acute renal dysfunction: Cr 1.77 today, was 1.17 prior to surgery. Peak 2.86, now downtrending. -received blood transfusion 4/15, 4/17 -avoid nephrotoxic agents -was NPO, now clears.   Pending transfer to the floor today.  For questions or updates, please contact [place] Please consult [email or web address] for contact info under     Signed, Buford Dresser, MD  06/23/2019, 10:48 AM

## 2019-06-23 NOTE — Plan of Care (Signed)
  Problem: Bowel/Gastric: Goal: Gastrointestinal status for postoperative course will improve Outcome: Progressing   Problem: Cardiac: Goal: Ability to maintain an adequate cardiac output will improve Outcome: Progressing   Problem: Clinical Measurements: Goal: Postoperative complications will be avoided or minimized Outcome: Progressing   Problem: Respiratory: Goal: Respiratory status will improve Outcome: Progressing   Problem: Urinary Elimination: Goal: Ability to achieve and maintain adequate renal perfusion and functioning will improve Outcome: Progressing   Problem: Clinical Measurements: Goal: Will remain free from infection Outcome: Progressing Goal: Diagnostic test results will improve Outcome: Progressing   Problem: Activity: Goal: Risk for activity intolerance will decrease Outcome: Progressing   Problem: Pain Managment: Goal: General experience of comfort will improve Outcome: Progressing

## 2019-06-23 NOTE — Progress Notes (Signed)
[place] for heparin Indication: atrial fibrillation  Allergies  Allergen Reactions  . Benadryl [Diphenhydramine] Shortness Of Breath  . Penicillins Diarrhea    Did it involve swelling of the face/tongue/throat, SOB, or low BP? No Did it involve sudden or severe rash/hives, skin peeling, or any reaction on the inside of your mouth or nose? No Did you need to seek medical attention at a hospital or doctor's office? No When did it last happen?2 months If all above answers are "NO", may proceed with cephalosporin use.  . Adhesive [Tape] Other (See Comments)    Burning of skin  . Amoxicillin Diarrhea and Other (See Comments)    Has patient had a PCN reaction causing immediate rash, facial/tongue/throat swelling, SOB or lightheadedness with hypotension: No Has patient had a PCN reaction causing severe rash involving mucus membranes or skin necrosis: No Has patient had a PCN reaction that required hospitalization No Has patient had a PCN reaction occurring within the last 10 years: Yes -- noted rxn as diarrhea If all of the above answers are "NO", then may proceed with Cephalosporin use.   . Cyclobenzaprine Other (See Comments)    Causes restless leg syndrome Restless syndrome  . Hydrocodone Itching    Patient Measurements: Weight: 79.4 kg (175 lb 0.7 oz) Heparin Dosing Weight: 79 kg  Vital Signs: Temp: 97.9 F (36.6 C) (04/18 2026) Temp Source: Oral (04/18 2026) BP: 122/81 (04/18 2026) Pulse Rate: 104 (04/18 2026)  Labs: Recent Labs    06/22/19 0341 06/22/19 1616 06/22/19 2243 06/23/19 0448 06/23/19 2021  HGB 7.7*  --  9.3*  --   --   HCT 26.0*  --  29.8*  --   --   PLT 122*  --  141*  --   --   HEPARINUNFRC  --   --   --   --  0.21*  CREATININE 2.14* 2.07*  --  1.77*  --     Estimated Creatinine Clearance: 29.4 mL/min (A) (by C-G formula based on SCr of 1.77 mg/dL (H)).   Medical History: Past Medical History:   Diagnosis Date  . Anemia   . Anginal pain ([place])    jaw pain 07/2016, s/p 07/15/16 nuclear stress test  . Anxiety   . Arthritis    "lower back" (08/24/2017)  . Asthma   . CAD (coronary artery disease), native coronary artery    a. Hx CABG 1998, last PCI 2013 b.LHC 07/2013:  EF 55-60%, L-LAD prob atretic, no sig disease in LAD, S-OM1 ok with patent stent, S-dRCA ok => med Rx. c. LHC 01/2015 DES to SVG-RCA.  Marland Kitchen Chronic back pain   . Chronic leg pain   . Chronic lower back pain   . COPD (chronic obstructive pulmonary disease) ([place])   . Depression   . DVT (deep venous thrombosis) ([facility]) X 3   RLE  . Fall 08/2016  . GERD (gastroesophageal reflux disease)   . Glucose intolerance (impaired glucose tolerance)   . Headache    "a few/month" (08/24/2017)  . HLD (hyperlipidemia)   . Hypertension   . Myocardial infarction ([place]) 1999  . OSA on CPAP   . PAD (peripheral artery disease) ([place])   . PAF (paroxysmal atrial fibrillation) ([place])    a. 09/2013 post-op b. 01/2015  . Peripheral neuropathy   . Pneumonia   . Shortness of breath    when walking  . Wears glasses     Assessment: 85 yoF s/p peripheral  bypass 4/13. Pt has hx AFib on apixaban PTA which has been held perioperatively. Pharmacy asked to begin IV heparin with no bolus. Will start low and titrate slowly after discussion with VVS.  Initial heparin level this evening below goal at 0.21. No bleeding issues noted.   Goal of Therapy:  Heparin level 0.3-0.7 units/ml Monitor platelets by anticoagulation protocol: Yes   Plan:  -Increase Heparin to 1050 units/h -Check heparin level in [place] PharmD., BCPS Clinical Pharmacist 06/23/2019 9:01 PM

## 2019-06-23 NOTE — Progress Notes (Signed)
Admit: 06/18/2019 LOS: 5  48F s/p aortobifemoral bypass 4/13; AFib with RVR post op; AKI  Subjective:  . 1.3L UOP . SCR further improved to 1.8 . K 4.1, HCO3 19 . No c/o   04/17 0701 - 04/18 0700 In: 270 [P.O.:240; I.V.:11.3; Blood:18.8] Out: 1250 [Urine:1250]  Filed Weights   06/21/19 0645 06/22/19 0615 06/23/19 0442  Weight: 71.7 kg 75.2 kg 79.4 kg    Scheduled Meds: . sodium chloride   Intravenous Once  . Chlorhexidine Gluconate Cloth  6 each Topical Daily  . Chlorhexidine Gluconate Cloth  6 each Topical Q0600  . digoxin  0.125 mg Intravenous Daily  . heparin  5,000 Units Subcutaneous Q8H  . mouth rinse  15 mL Mouth Rinse BID  . morphine   Intravenous Q4H  . mupirocin ointment  1 application Nasal BID  . pantoprazole (PROTONIX) IV  40 mg Intravenous Daily  . sodium chloride flush  10-40 mL Intracatheter Q12H   Continuous Infusions: . phenylephrine (NEO-SYNEPHRINE) Adult infusion Stopped (06/21/19 1301)   PRN Meds:.acetaminophen **OR** acetaminophen, alum & mag hydroxide-simeth, diphenhydrAMINE **OR** diphenhydrAMINE, guaiFENesin-dextromethorphan, hydrALAZINE, metoprolol tartrate, morphine injection, naloxone **AND** sodium chloride flush, ondansetron, phenol, potassium chloride, sodium chloride flush  Current Labs: reviewed    Physical Exam:  Blood pressure 132/72, pulse 93, temperature 97.6 F (36.4 C), temperature source Oral, resp. rate 18, weight 79.4 kg, SpO2 100 %. NAD  RRR CTAB No sig LEE No rashes/lesions R IJ [place]  A 1. AKI with b/l CKD3 in setting of #2; now resolving and recoverign GFR 2. S/p aortobifemoral bypass 4/13 3. CAD w/ hx/o CABG 4. AFib, RVR post-op 5. Chronic dCHF 6. Anemia,   P . Stable, improvign GFR . No further suggestions . Will arrange f/u at [facility] in next mo, we will call with info for her . She has my Card . Will s/o for now . Daily weights, Daily Renal Panel, Strict I/Os, Avoid nephrotoxins (NSAIDs, judicious IV  Contrast)   Cheslock Grippe MD 06/23/2019, 7:52 AM  Recent Labs  Lab 06/21/19 0508 06/21/19 0508 06/22/19 0341 06/22/19 1616 06/23/19 0448  NA 144   < > 145 143 144  K 4.7   < > 4.3 4.5 4.1  CL 116*   < > 118* 115* 117*  CO2 18*   < > 19* 17* 19*  GLUCOSE 91   < > 93 100* 98  BUN 29*   < > 25* 25* 19  CREATININE 2.46*   < > 2.14* 2.07* 1.77*  CALCIUM 8.3*   < > 8.1* 8.2* 8.3*  PHOS 4.9*  --  4.1  --  3.8   < > = values in this interval not displayed.   Recent Labs  Lab 06/20/19 1322 06/22/19 0341 06/22/19 2243  WBC 7.6 4.1 5.6  HGB 9.0* 7.7* 9.3*  HCT 28.8* 26.0* 29.8*  MCV 92.9 96.3 93.4  PLT 137* 122* 141*

## 2019-06-23 NOTE — Progress Notes (Signed)
Auditory hallucinations  During shift change, pt expressed concern that she was having "strange dreams." With further questioning, pt describes these dreams as "conversations" both with people known to her and strangers. Some of these conversations are with loved ones she knows to be deceased. When asked if the conversations were pleasant or unsettling, pt says it's a mix of the two.  Pt denies visual/tactile/olfactory/gustatory hallucinations.  We discussed hospital delirium, role of the PCA in sleep wake cycle and dreams, as well as lights on / blinds open / up to chair interventions that will help her stay awake during the day.  Therapeutic communication provided. Pt instructed to tell healthcare team members if/when these "conversations" recur

## 2019-06-23 NOTE — Progress Notes (Signed)
[facility]  Subjective  - no complaints.  Tolerating CLD.  Hgb responded to 1 upRBC.  HR improved with digoxin.   Objective 129/72 91 98.3 F (36.8 C) (Oral) 13 98%  Intake/Output Summary (Last 24 hours) at 06/23/2019 0941 Last data filed at 06/23/2019 0900 Gross per 24 hour  Intake 385.5 ml  Output 1050 ml  Net -664.5 ml    Midline incision and left groin incision c/d/i Right groin with serous drainage Palpable DP pulses bilateral lower extremities  Laboratory Lab Results: Recent Labs    06/22/19 0341 06/22/19 2243  WBC 4.1 5.6  HGB 7.7* 9.3*  HCT 26.0* 29.8*  PLT 122* 141*   BMET Recent Labs    06/22/19 1616 06/23/19 0448  NA 143 144  K 4.5 4.1  CL 115* 117*  CO2 17* 19*  GLUCOSE 100* 98  BUN 25* 19  CREATININE 2.07* 1.77*  CALCIUM 8.2* 8.3*    COAG Lab Results  Component Value Date   INR 1.4 (H) 06/18/2019   INR 1.6 (H) 06/14/2019   INR 1.1 (A) 10/17/2017   No results found for: PTT  Assessment/Planning: POD #5 status post aortobifemoral bypass.  Neuro: GCS 15.  Neuro intact. Cardiovascular: A. fib with RVR - now rate controlled on digoxin.  Appreciate cardiology input.  Hemoglobin 7.7 --> 9.3 after 1 unit transfusion.  Start heparin today without bolus for hx afib with RVR. Pulmonary: Nasal cannula 1 L GI: NG out.  Tolerating clear liquids.  Wants to stay on CLD.  Passing gas. Renal: Urine output 1250 last 24 hours.  Creatinine from 2.14 to 1.77 improving. IVF locked. FEN: Potassium okay.  Saline lock fluids per nephrology.  Dispo: Transfer to floor.  D/C arterial line. Leave on CLD.  Start heparin without bolus.  HR improved on digoxin.  Dry dressings to both groins.  Serous drainage from right groin.  Marty Heck 06/23/2019 9:41 AM --

## 2019-06-23 NOTE — Progress Notes (Signed)
ANTICOAGULATION CONSULT NOTE - Initial Consult  Pharmacy Consult for heparin Indication: atrial fibrillation  Allergies  Allergen Reactions  . Benadryl [Diphenhydramine] Shortness Of Breath  . Penicillins Diarrhea    Did it involve swelling of the face/tongue/throat, SOB, or low BP? No Did it involve sudden or severe rash/hives, skin peeling, or any reaction on the inside of your mouth or nose? No Did you need to seek medical attention at a hospital or doctor's office? No When did it last happen?2 months If all above answers are "NO", may proceed with cephalosporin use.  . Adhesive [Tape] Other (See Comments)    Burning of skin  . Amoxicillin Diarrhea and Other (See Comments)    Has patient had a PCN reaction causing immediate rash, facial/tongue/throat swelling, SOB or lightheadedness with hypotension: No Has patient had a PCN reaction causing severe rash involving mucus membranes or skin necrosis: No Has patient had a PCN reaction that required hospitalization No Has patient had a PCN reaction occurring within the last 10 years: Yes -- noted rxn as diarrhea If all of the above answers are "NO", then may proceed with Cephalosporin use.   . Cyclobenzaprine Other (See Comments)    Causes restless leg syndrome Restless syndrome  . Hydrocodone Itching    Patient Measurements: Weight: 79.4 kg (175 lb 0.7 oz) Heparin Dosing Weight: 79 kg  Vital Signs: Temp: 98.3 F (36.8 C) (04/18 0757) Temp Source: Oral (04/18 0757) BP: 125/77 (04/18 1000) Pulse Rate: 93 (04/18 1000)  Labs: Recent Labs    06/20/19 1322 06/21/19 0508 06/22/19 0341 06/22/19 1616 06/22/19 2243 06/23/19 0448  HGB 9.0*  --  7.7*  --  9.3*  --   HCT 28.8*  --  26.0*  --  29.8*  --   PLT 137*  --  122*  --  141*  --   CREATININE 2.81*   < > 2.14* 2.07*  --  1.77*   < > = values in this interval not displayed.    Estimated Creatinine Clearance: 29.4 mL/min (A) (by C-G formula based on SCr of 1.77  mg/dL (H)).   Medical History: Past Medical History:  Diagnosis Date  . Anemia   . Anginal pain ([place])    jaw pain 07/2016, s/p 07/15/16 nuclear stress test  . Anxiety   . Arthritis    "lower back" (08/24/2017)  . Asthma   . CAD (coronary artery disease), native coronary artery    a. Hx CABG 1998, last PCI 2013 b.LHC 07/2013:  EF 55-60%, L-LAD prob atretic, no sig disease in LAD, S-OM1 ok with patent stent, S-dRCA ok => med Rx. c. LHC 01/2015 DES to SVG-RCA.  Marland Kitchen Chronic back pain   . Chronic leg pain   . Chronic lower back pain   . COPD (chronic obstructive pulmonary disease) ([place])   . Depression   . DVT (deep venous thrombosis) ([facility]) X 3   RLE  . Fall 08/2016  . GERD (gastroesophageal reflux disease)   . Glucose intolerance (impaired glucose tolerance)   . Headache    "a few/month" (08/24/2017)  . HLD (hyperlipidemia)   . Hypertension   . Myocardial infarction ([place]) 1999  . OSA on CPAP   . PAD (peripheral artery disease) ([place])   . PAF (paroxysmal atrial fibrillation) ([place])    a. 09/2013 post-op b. 01/2015  . Peripheral neuropathy   . Pneumonia   . Shortness of breath    when walking  . Wears glasses  Assessment: 33 yoF s/p peripheral bypass 4/13. Pt has hx AFib on apixaban PTA which has been held perioperatively. Pharmacy asked to begin IV heparin with no bolus. Will start low and titrate slowly after discussion with VVS.  Goal of Therapy:  Heparin level 0.3-0.7 units/ml Monitor platelets by anticoagulation protocol: Yes   Plan:  -Heparin 900 units/h -Check heparin level in 8h   Arrie Senate, PharmD, BCPS Clinical Pharmacist [phone] Please check AMION for all [place] numbers 06/23/2019

## 2019-06-23 NOTE — Progress Notes (Signed)
I put the wrong number at first. This is the correct one - Wendy [name] 06/23/2019 7:57am

## 2019-06-24 ENCOUNTER — Inpatient Hospital Stay (HOSPITAL_COMMUNITY): Payer: Medicare HMO | Admitting: Certified Registered Nurse Anesthetist

## 2019-06-24 ENCOUNTER — Inpatient Hospital Stay (HOSPITAL_COMMUNITY): Payer: Medicare HMO

## 2019-06-24 ENCOUNTER — Inpatient Hospital Stay (HOSPITAL_COMMUNITY): Payer: Medicare HMO | Admitting: Anesthesiology

## 2019-06-24 ENCOUNTER — Encounter (HOSPITAL_COMMUNITY)
Admission: RE | Disposition: A | Discharge status: Skilled Nursing Facility | Payer: Self-pay | Source: Home / Self Care | Attending: Vascular Surgery

## 2019-06-24 ENCOUNTER — Encounter (HOSPITAL_COMMUNITY): Payer: Self-pay | Admitting: Vascular Surgery

## 2019-06-24 DIAGNOSIS — N179 Acute kidney failure, unspecified: Secondary | ICD-10-CM

## 2019-06-24 DIAGNOSIS — I7409 Other arterial embolism and thrombosis of abdominal aorta: Secondary | ICD-10-CM | POA: Diagnosis not present

## 2019-06-24 DIAGNOSIS — I469 Cardiac arrest, cause unspecified: Secondary | ICD-10-CM

## 2019-06-24 DIAGNOSIS — I340 Nonrheumatic mitral (valve) insufficiency: Secondary | ICD-10-CM | POA: Diagnosis not present

## 2019-06-24 DIAGNOSIS — R570 Cardiogenic shock: Secondary | ICD-10-CM

## 2019-06-24 DIAGNOSIS — I4891 Unspecified atrial fibrillation: Secondary | ICD-10-CM | POA: Diagnosis not present

## 2019-06-24 HISTORY — PX: PLACEMENT OF IMPELLA LEFT VENTRICULAR ASSIST DEVICE: SHX6519

## 2019-06-24 LAB — POCT I-STAT 7, (LYTES, BLD GAS, ICA,H+H)
Acid-base deficit: 20 mmol/L — ABNORMAL HIGH (ref 0.0–2.0)
Acid-base deficit: 11 mmol/L — ABNORMAL HIGH (ref 0.0–2.0)
Acid-base deficit: 2 mmol/L (ref 0.0–2.0)
Acid-base deficit: 5 mmol/L — ABNORMAL HIGH (ref 0.0–2.0)
Acid-base deficit: 6 mmol/L — ABNORMAL HIGH (ref 0.0–2.0)
Acid-base deficit: 5 mmol/L — ABNORMAL HIGH (ref 0.0–2.0)
Acid-base deficit: 2 mmol/L (ref 0.0–2.0)
Bicarbonate: 12.1 mmol/L — ABNORMAL LOW (ref 20.0–28.0)
Bicarbonate: 16.8 mmol/L — ABNORMAL LOW (ref 20.0–28.0)
Bicarbonate: 20.4 mmol/L (ref 20.0–28.0)
Bicarbonate: 23.2 mmol/L (ref 20.0–28.0)
Bicarbonate: 19.9 mmol/L — ABNORMAL LOW (ref 20.0–28.0)
Bicarbonate: 19.6 mmol/L — ABNORMAL LOW (ref 20.0–28.0)
Bicarbonate: 22.1 mmol/L (ref 20.0–28.0)
Calcium, Ion: 1.14 mmol/L — ABNORMAL LOW (ref 1.15–1.40)
Calcium, Ion: 1.32 mmol/L (ref 1.15–1.40)
Calcium, Ion: 1.18 mmol/L (ref 1.15–1.40)
Calcium, Ion: 1.21 mmol/L (ref 1.15–1.40)
Calcium, Ion: 1.22 mmol/L (ref 1.15–1.40)
Calcium, Ion: 1.21 mmol/L (ref 1.15–1.40)
Calcium, Ion: 1.23 mmol/L (ref 1.15–1.40)
HCT: 30 % — ABNORMAL LOW (ref 36.0–46.0)
HCT: 29 % — ABNORMAL LOW (ref 36.0–46.0)
HCT: 26 % — ABNORMAL LOW (ref 36.0–46.0)
HCT: 27 % — ABNORMAL LOW (ref 36.0–46.0)
HCT: 24 % — ABNORMAL LOW (ref 36.0–46.0)
HCT: 27 % — ABNORMAL LOW (ref 36.0–46.0)
HCT: 28 % — ABNORMAL LOW (ref 36.0–46.0)
Hemoglobin: 10.2 g/dL — ABNORMAL LOW (ref 12.0–15.0)
Hemoglobin: 9.9 g/dL — ABNORMAL LOW (ref 12.0–15.0)
Hemoglobin: 8.8 g/dL — ABNORMAL LOW (ref 12.0–15.0)
Hemoglobin: 9.2 g/dL — ABNORMAL LOW (ref 12.0–15.0)
Hemoglobin: 8.2 g/dL — ABNORMAL LOW (ref 12.0–15.0)
Hemoglobin: 9.2 g/dL — ABNORMAL LOW (ref 12.0–15.0)
Hemoglobin: 9.5 g/dL — ABNORMAL LOW (ref 12.0–15.0)
O2 Saturation: 100 %
O2 Saturation: 88 %
O2 Saturation: 97 %
O2 Saturation: 97 %
O2 Saturation: 96 %
O2 Saturation: 92 %
O2 Saturation: 98 %
Patient temperature: 97.6
Patient temperature: 34.8
Patient temperature: 34.5
Patient temperature: 34.6
Patient temperature: 37.2
Potassium: 4.8 mmol/L (ref 3.5–5.1)
Potassium: 3.4 mmol/L — ABNORMAL LOW (ref 3.5–5.1)
Potassium: 3.1 mmol/L — ABNORMAL LOW (ref 3.5–5.1)
Potassium: 3.2 mmol/L — ABNORMAL LOW (ref 3.5–5.1)
Potassium: 2.8 mmol/L — ABNORMAL LOW (ref 3.5–5.1)
Potassium: 2.7 mmol/L — CL (ref 3.5–5.1)
Potassium: 2.9 mmol/L — ABNORMAL LOW (ref 3.5–5.1)
Sodium: 145 mmol/L (ref 135–145)
Sodium: 149 mmol/L — ABNORMAL HIGH (ref 135–145)
Sodium: 149 mmol/L — ABNORMAL HIGH (ref 135–145)
Sodium: 151 mmol/L — ABNORMAL HIGH (ref 135–145)
Sodium: 148 mmol/L — ABNORMAL HIGH (ref 135–145)
Sodium: 148 mmol/L — ABNORMAL HIGH (ref 135–145)
Sodium: 149 mmol/L — ABNORMAL HIGH (ref 135–145)
TCO2: 14 mmol/L — ABNORMAL LOW (ref 22–32)
TCO2: 18 mmol/L — ABNORMAL LOW (ref 22–32)
TCO2: 22 mmol/L (ref 22–32)
TCO2: 24 mmol/L (ref 22–32)
TCO2: 21 mmol/L — ABNORMAL LOW (ref 22–32)
TCO2: 21 mmol/L — ABNORMAL LOW (ref 22–32)
TCO2: 23 mmol/L (ref 22–32)
pCO2 arterial: 53.7 mmHg — ABNORMAL HIGH (ref 32.0–48.0)
pCO2 arterial: 44.3 mmHg (ref 32.0–48.0)
pCO2 arterial: 33.4 mmHg (ref 32.0–48.0)
pCO2 arterial: 38.1 mmHg (ref 32.0–48.0)
pCO2 arterial: 37.2 mmHg (ref 32.0–48.0)
pCO2 arterial: 31.8 mmHg — ABNORMAL LOW (ref 32.0–48.0)
pCO2 arterial: 36 mmHg (ref 32.0–48.0)
pH, Arterial: 6.956 — CL (ref 7.350–7.450)
pH, Arterial: 7.187 — CL (ref 7.350–7.450)
pH, Arterial: 7.384 (ref 7.350–7.450)
pH, Arterial: 7.393 (ref 7.350–7.450)
pH, Arterial: 7.324 — ABNORMAL LOW (ref 7.350–7.450)
pH, Arterial: 7.387 (ref 7.350–7.450)
pH, Arterial: 7.396 (ref 7.350–7.450)
pO2, Arterial: 269 mmHg — ABNORMAL HIGH (ref 83.0–108.0)
pO2, Arterial: 68 mmHg — ABNORMAL LOW (ref 83.0–108.0)
pO2, Arterial: 78 mmHg — ABNORMAL LOW (ref 83.0–108.0)
pO2, Arterial: 88 mmHg (ref 83.0–108.0)
pO2, Arterial: 76 mmHg — ABNORMAL LOW (ref 83.0–108.0)
pO2, Arterial: 57 mmHg — ABNORMAL LOW (ref 83.0–108.0)
pO2, Arterial: 99 mmHg (ref 83.0–108.0)

## 2019-06-24 LAB — CK TOTAL AND CKMB (NOT AT ARMC)
CK, MB: 11.2 ng/mL — ABNORMAL HIGH (ref 0.5–5.0)
Relative Index: INVALID (ref 0.0–2.5)
Total CK: 86 U/L (ref 38–234)

## 2019-06-24 LAB — CBC WITH DIFFERENTIAL/PLATELET
Abs Immature Granulocytes: 0.8 10*3/uL — ABNORMAL HIGH (ref 0.00–0.07)
Basophils Absolute: 0.1 10*3/uL (ref 0.0–0.1)
Basophils Relative: 1 %
Eosinophils Absolute: 0.1 10*3/uL (ref 0.0–0.5)
Eosinophils Relative: 1 %
HCT: 34.1 % — ABNORMAL LOW (ref 36.0–46.0)
Hemoglobin: 10.3 g/dL — ABNORMAL LOW (ref 12.0–15.0)
Immature Granulocytes: 7 %
Lymphocytes Relative: 21 %
Lymphs Abs: 2.5 10*3/uL (ref 0.7–4.0)
MCH: 29.3 pg (ref 26.0–34.0)
MCHC: 30.2 g/dL (ref 30.0–36.0)
MCV: 96.9 fL (ref 80.0–100.0)
Monocytes Absolute: 0.4 10*3/uL (ref 0.1–1.0)
Monocytes Relative: 4 %
Neutro Abs: 7.9 10*3/uL — ABNORMAL HIGH (ref 1.7–7.7)
Neutrophils Relative %: 66 %
Platelets: 301 10*3/uL (ref 150–400)
RBC: 3.52 MIL/uL — ABNORMAL LOW (ref 3.87–5.11)
RDW: 16.3 % — ABNORMAL HIGH (ref 11.5–15.5)
WBC: 11.9 10*3/uL — ABNORMAL HIGH (ref 4.0–10.5)
nRBC: 0.7 % — ABNORMAL HIGH (ref 0.0–0.2)

## 2019-06-24 LAB — COOXEMETRY PANEL
Carboxyhemoglobin: 1.3 % (ref 0.5–1.5)
Methemoglobin: 1.3 % (ref 0.0–1.5)
O2 Saturation: 63.3 %
Total hemoglobin: 10.7 g/dL — ABNORMAL LOW (ref 12.0–16.0)

## 2019-06-24 LAB — BASIC METABOLIC PANEL
Anion gap: 13 (ref 5–15)
BUN: 15 mg/dL (ref 8–23)
CO2: 20 mmol/L — ABNORMAL LOW (ref 22–32)
Calcium: 8.2 mg/dL — ABNORMAL LOW (ref 8.9–10.3)
Chloride: 114 mmol/L — ABNORMAL HIGH (ref 98–111)
Creatinine, Ser: 1.13 mg/dL — ABNORMAL HIGH (ref 0.44–1.00)
GFR calc Af Amer: 58 mL/min — ABNORMAL LOW (ref >60)
GFR calc non Af Amer: 50 mL/min — ABNORMAL LOW (ref >60)
Glucose, Bld: 248 mg/dL — ABNORMAL HIGH (ref 70–99)
Potassium: 3 mmol/L — ABNORMAL LOW (ref 3.5–5.1)
Sodium: 147 mmol/L — ABNORMAL HIGH (ref 135–145)

## 2019-06-24 LAB — HEPARIN LEVEL (UNFRACTIONATED): Heparin Unfractionated: 0.26 IU/mL — ABNORMAL LOW (ref 0.30–0.70)

## 2019-06-24 LAB — RENAL FUNCTION PANEL
Albumin: 2.2 g/dL — ABNORMAL LOW (ref 3.5–5.0)
Anion gap: 12 (ref 5–15)
BUN: 15 mg/dL (ref 8–23)
CO2: 20 mmol/L — ABNORMAL LOW (ref 22–32)
Calcium: 8.5 mg/dL — ABNORMAL LOW (ref 8.9–10.3)
Chloride: 114 mmol/L — ABNORMAL HIGH (ref 98–111)
Creatinine, Ser: 1.26 mg/dL — ABNORMAL HIGH (ref 0.44–1.00)
GFR calc Af Amer: 51 mL/min — ABNORMAL LOW (ref >60)
GFR calc non Af Amer: 44 mL/min — ABNORMAL LOW (ref >60)
Glucose, Bld: 113 mg/dL — ABNORMAL HIGH (ref 70–99)
Phosphorus: 3 mg/dL (ref 2.5–4.6)
Potassium: 4 mmol/L (ref 3.5–5.1)
Sodium: 146 mmol/L — ABNORMAL HIGH (ref 135–145)

## 2019-06-24 LAB — CBC
HCT: 32.9 % — ABNORMAL LOW (ref 36.0–46.0)
Hemoglobin: 10.3 g/dL — ABNORMAL LOW (ref 12.0–15.0)
MCH: 28.9 pg (ref 26.0–34.0)
MCHC: 31.3 g/dL (ref 30.0–36.0)
MCV: 92.2 fL (ref 80.0–100.0)
Platelets: 162 10*3/uL (ref 150–400)
RBC: 3.57 MIL/uL — ABNORMAL LOW (ref 3.87–5.11)
RDW: 15.9 % — ABNORMAL HIGH (ref 11.5–15.5)
WBC: 5 10*3/uL (ref 4.0–10.5)
nRBC: 0 % (ref 0.0–0.2)

## 2019-06-24 LAB — COMPREHENSIVE METABOLIC PANEL
ALT: 20 U/L (ref 0–44)
AST: 52 U/L — ABNORMAL HIGH (ref 15–41)
Albumin: 1.8 g/dL — ABNORMAL LOW (ref 3.5–5.0)
Alkaline Phosphatase: 99 U/L (ref 38–126)
Anion gap: 16 — ABNORMAL HIGH (ref 5–15)
BUN: 15 mg/dL (ref 8–23)
CO2: 12 mmol/L — ABNORMAL LOW (ref 22–32)
Calcium: 7.5 mg/dL — ABNORMAL LOW (ref 8.9–10.3)
Chloride: 116 mmol/L — ABNORMAL HIGH (ref 98–111)
Creatinine, Ser: 1.33 mg/dL — ABNORMAL HIGH (ref 0.44–1.00)
GFR calc Af Amer: 48 mL/min — ABNORMAL LOW (ref >60)
GFR calc non Af Amer: 41 mL/min — ABNORMAL LOW (ref >60)
Glucose, Bld: 246 mg/dL — ABNORMAL HIGH (ref 70–99)
Potassium: 5.2 mmol/L — ABNORMAL HIGH (ref 3.5–5.1)
Sodium: 144 mmol/L (ref 135–145)
Total Bilirubin: 0.8 mg/dL (ref 0.3–1.2)
Total Protein: 4.7 g/dL — ABNORMAL LOW (ref 6.5–8.1)

## 2019-06-24 LAB — ECHO INTRAOPERATIVE TEE: Weight: 2676.8 oz

## 2019-06-24 LAB — LACTIC ACID, PLASMA
Lactic Acid, Venous: 8.3 mmol/L (ref 0.5–1.9)
Lactic Acid, Venous: 4.4 mmol/L (ref 0.5–1.9)

## 2019-06-24 LAB — HEMOGLOBIN A1C
Hgb A1c MFr Bld: 5.4 % (ref 4.8–5.6)
Mean Plasma Glucose: 108.28 mg/dL

## 2019-06-24 LAB — PROTIME-INR
INR: 1.8 — ABNORMAL HIGH (ref 0.8–1.2)
Prothrombin Time: 20.8 seconds — ABNORMAL HIGH (ref 11.4–15.2)

## 2019-06-24 LAB — GLUCOSE, CAPILLARY
Glucose-Capillary: 128 mg/dL — ABNORMAL HIGH (ref 70–99)
Glucose-Capillary: 228 mg/dL — ABNORMAL HIGH (ref 70–99)

## 2019-06-24 LAB — ECHOCARDIOGRAM COMPLETE: Weight: 2676.8 oz

## 2019-06-24 LAB — TROPONIN I (HIGH SENSITIVITY): Troponin I (High Sensitivity): 1802 ng/L (ref <18)

## 2019-06-24 SURGERY — INSERTION, CARDIAC ASSIST DEVICE, IMPELLA
Anesthesia: General

## 2019-06-24 MED ORDER — LORAZEPAM 2 MG/ML IJ SOLN: 0.5000 mg | Freq: Once | INTRAMUSCULAR | Status: DC

## 2019-06-24 MED ORDER — POTASSIUM CHLORIDE 10 MEQ/50ML IV SOLN
10.0000 meq | INTRAVENOUS | Status: AC
  Administered 2019-06-24 – 2019-06-25 (×4): 10 meq via INTRAVENOUS
  Filled 2019-06-24 (×4): qty 50

## 2019-06-24 MED ORDER — INSULIN ASPART 100 UNIT/ML ~~LOC~~ SOLN
0.0000 [IU] | SUBCUTANEOUS | Status: DC
  Administered 2019-06-24: 3 [IU] via SUBCUTANEOUS
  Administered 2019-06-25 (×2): 1 [IU] via SUBCUTANEOUS
  Administered 2019-06-25: 2 [IU] via SUBCUTANEOUS
  Administered 2019-06-26 – 2019-07-01 (×5): 1 [IU] via SUBCUTANEOUS
  Administered 2019-07-01: 2 [IU] via SUBCUTANEOUS
  Administered 2019-07-01 – 2019-07-10 (×5): 1 [IU] via SUBCUTANEOUS
  Administered 2019-07-10: 2 [IU] via SUBCUTANEOUS
  Administered 2019-07-10: 17:00:00 1 [IU] via SUBCUTANEOUS

## 2019-06-24 MED ORDER — SODIUM BICARBONATE 8.4 % IV SOLN
INTRAVENOUS | Status: AC
  Filled 2019-06-24: qty 50

## 2019-06-24 MED ORDER — NOREPINEPHRINE 4 MG/250ML-% IV SOLN
INTRAVENOUS | Status: AC
  Filled 2019-06-24: qty 250

## 2019-06-24 MED ORDER — LACTATED RINGERS IV SOLN: INTRAVENOUS | Status: DC | PRN

## 2019-06-24 MED ORDER — SODIUM CHLORIDE 0.9 % IV SOLN
INTRAVENOUS | Status: DC | PRN
  Administered 2019-06-24: 500 mL

## 2019-06-24 MED ORDER — GABAPENTIN 300 MG PO CAPS
300.0000 mg | ORAL_CAPSULE | Freq: Three times a day (TID) | ORAL | Status: DC | PRN
  Administered 2019-07-03 – 2019-07-12 (×7): 300 mg via ORAL
  Filled 2019-06-24 (×8): qty 1

## 2019-06-24 MED ORDER — ALPRAZOLAM 0.5 MG PO TABS
0.5000 mg | ORAL_TABLET | Freq: Three times a day (TID) | ORAL | Status: DC | PRN
  Administered 2019-06-25 – 2019-06-28 (×2): 0.5 mg via ORAL
  Filled 2019-06-24 (×2): qty 1

## 2019-06-24 MED ORDER — EZETIMIBE 10 MG PO TABS
10.0000 mg | ORAL_TABLET | Freq: Every day | ORAL | Status: DC
  Administered 2019-06-24: 10 mg via ORAL
  Filled 2019-06-24: qty 1

## 2019-06-24 MED ORDER — VASOPRESSIN 20 UNIT/ML IV SOLN
INTRAVENOUS | Status: AC
  Filled 2019-06-24: qty 1

## 2019-06-24 MED ORDER — SODIUM CHLORIDE 0.9 % IV SOLN
INTRAVENOUS | Status: DC | PRN
  Administered 2019-06-24 – 2019-06-26 (×2): 250 mL via INTRAVENOUS
  Administered 2019-06-28: 1000 mL via INTRAVENOUS

## 2019-06-24 MED ORDER — AMIODARONE HCL IN DEXTROSE 360-4.14 MG/200ML-% IV SOLN
60.0000 mg/h | INTRAVENOUS | Status: DC
  Filled 2019-06-24 (×2): qty 200

## 2019-06-24 MED ORDER — MIDAZOLAM HCL 2 MG/2ML IJ SOLN
INTRAMUSCULAR | Status: AC
  Filled 2019-06-24: qty 2

## 2019-06-24 MED ORDER — AMIODARONE HCL IN DEXTROSE 360-4.14 MG/200ML-% IV SOLN
60.0000 mg/h | INTRAVENOUS | Status: AC
  Administered 2019-06-24 – 2019-06-25 (×2): 60 mg/h via INTRAVENOUS
  Filled 2019-06-24: qty 200

## 2019-06-24 MED ORDER — ALBUTEROL SULFATE HFA 108 (90 BASE) MCG/ACT IN AERS
INHALATION_SPRAY | RESPIRATORY_TRACT | Status: DC | PRN
  Administered 2019-06-24: 2 via RESPIRATORY_TRACT

## 2019-06-24 MED ORDER — SODIUM BICARBONATE 8.4 % IV SOLN
INTRAVENOUS | Status: AC
  Filled 2019-06-24: qty 150

## 2019-06-24 MED ORDER — AMIODARONE HCL IN DEXTROSE 360-4.14 MG/200ML-% IV SOLN
30.0000 mg/h | INTRAVENOUS | Status: DC
  Administered 2019-06-25: 60 mg/h via INTRAVENOUS
  Administered 2019-06-26: 30 mg/h via INTRAVENOUS
  Administered 2019-06-26 (×2): 60 mg/h via INTRAVENOUS
  Administered 2019-06-27 – 2019-06-29 (×5): 30 mg/h via INTRAVENOUS
  Filled 2019-06-24 (×9): qty 200

## 2019-06-24 MED ORDER — ALBUTEROL SULFATE HFA 108 (90 BASE) MCG/ACT IN AERS
INHALATION_SPRAY | RESPIRATORY_TRACT | Status: AC
  Filled 2019-06-24: qty 6.7

## 2019-06-24 MED ORDER — VASOPRESSIN 20 UNIT/ML IV SOLN
INTRAVENOUS | Status: DC | PRN
  Administered 2019-06-24: 1 [IU] via INTRAVENOUS
  Administered 2019-06-24: 2 [IU] via INTRAVENOUS

## 2019-06-24 MED ORDER — AMIODARONE HCL IN DEXTROSE 360-4.14 MG/200ML-% IV SOLN
30.0000 mg/h | INTRAVENOUS | Status: DC
  Filled 2019-06-24: qty 200

## 2019-06-24 MED ORDER — 0.9 % SODIUM CHLORIDE (POUR BTL) OPTIME
TOPICAL | Status: DC | PRN
  Administered 2019-06-24: 14:00:00 3000 mL

## 2019-06-24 MED ORDER — ORAL CARE MOUTH RINSE
15.0000 mL | OROMUCOSAL | Status: DC
  Administered 2019-06-25 (×5): 15 mL via OROMUCOSAL

## 2019-06-24 MED ORDER — VANCOMYCIN HCL IN DEXTROSE 1-5 GM/200ML-% IV SOLN
INTRAVENOUS | Status: AC
  Filled 2019-06-24: qty 200

## 2019-06-24 MED ORDER — ATORVASTATIN CALCIUM 80 MG PO TABS
80.0000 mg | ORAL_TABLET | Freq: Every day | ORAL | Status: DC
  Administered 2019-06-24: 80 mg via ORAL
  Filled 2019-06-24: qty 1

## 2019-06-24 MED ORDER — NOREPINEPHRINE 4 MG/250ML-% IV SOLN
INTRAVENOUS | Status: DC | PRN
  Administered 2019-06-24: 25 ug/min via INTRAVENOUS

## 2019-06-24 MED ORDER — HEPARIN SODIUM (PORCINE) 1000 UNIT/ML IJ SOLN
INTRAMUSCULAR | Status: AC
  Filled 2019-06-24: qty 1

## 2019-06-24 MED ORDER — FENTANYL CITRATE (PF) 100 MCG/2ML IJ SOLN: 25.0000 ug | INTRAMUSCULAR | Status: DC | PRN

## 2019-06-24 MED ORDER — NOREPINEPHRINE BITARTRATE 1 MG/ML IV SOLN
INTRAVENOUS | Status: DC | PRN
  Administered 2019-06-24: 2 mL via INTRAVENOUS

## 2019-06-24 MED ORDER — FENTANYL BOLUS VIA INFUSION
25.0000 ug | INTRAVENOUS | Status: DC | PRN
  Filled 2019-06-24: qty 25

## 2019-06-24 MED ORDER — SODIUM BICARBONATE 8.4 % IV SOLN
INTRAVENOUS | Status: DC | PRN
  Administered 2019-06-24 (×2): 50 meq via INTRAVENOUS

## 2019-06-24 MED ORDER — CHLORHEXIDINE GLUCONATE 0.12% ORAL RINSE (MEDLINE KIT)
15.0000 mL | Freq: Two times a day (BID) | OROMUCOSAL | Status: DC
  Administered 2019-06-24 – 2019-06-25 (×2): 15 mL via OROMUCOSAL

## 2019-06-24 MED ORDER — HEPARIN SODIUM (PORCINE) 5000 UNIT/ML IJ SOLN
50000.0000 [IU] | INTRAVENOUS | Status: DC
  Administered 2019-06-28: 50000 [IU]
  Filled 2019-06-24 (×2): qty 10

## 2019-06-24 MED ORDER — FENTANYL CITRATE (PF) 100 MCG/2ML IJ SOLN
100.0000 ug | Freq: Once | INTRAMUSCULAR | Status: AC
  Administered 2019-06-24: 100 ug via INTRAVENOUS

## 2019-06-24 MED ORDER — ETOMIDATE 2 MG/ML IV SOLN
INTRAVENOUS | Status: DC | PRN
  Administered 2019-06-24: 16 mg via INTRAVENOUS

## 2019-06-24 MED ORDER — SUCCINYLCHOLINE CHLORIDE 20 MG/ML IJ SOLN
INTRAMUSCULAR | Status: DC | PRN
  Administered 2019-06-24: 120 mg via INTRAVENOUS

## 2019-06-24 MED ORDER — MIDAZOLAM HCL 5 MG/5ML IJ SOLN
INTRAMUSCULAR | Status: DC | PRN
  Administered 2019-06-24: 2 mg via INTRAVENOUS

## 2019-06-24 MED ORDER — FENTANYL CITRATE (PF) 100 MCG/2ML IJ SOLN
25.0000 ug | INTRAMUSCULAR | Status: DC | PRN
  Administered 2019-06-25: 50 ug via INTRAVENOUS
  Filled 2019-06-24: qty 2

## 2019-06-24 MED ORDER — VANCOMYCIN HCL 1000 MG IV SOLR
INTRAVENOUS | Status: DC | PRN
  Administered 2019-06-24: 1000 mg via INTRAVENOUS

## 2019-06-24 MED ORDER — FAMOTIDINE IN NACL 20-0.9 MG/50ML-% IV SOLN: 20.0000 mg | Freq: Two times a day (BID) | INTRAVENOUS | Status: DC

## 2019-06-24 MED ORDER — POLYETHYLENE GLYCOL 3350 17 G PO PACK: 17.0000 g | PACK | Freq: Every day | ORAL | Status: DC

## 2019-06-24 MED ORDER — FENTANYL CITRATE (PF) 250 MCG/5ML IJ SOLN
INTRAMUSCULAR | Status: AC
  Filled 2019-06-24: qty 5

## 2019-06-24 MED ORDER — METOPROLOL TARTRATE 25 MG PO TABS: 25.0000 mg | ORAL_TABLET | Freq: Two times a day (BID) | ORAL | Status: DC

## 2019-06-24 MED ORDER — PANTOPRAZOLE SODIUM 40 MG PO TBEC
40.0000 mg | DELAYED_RELEASE_TABLET | Freq: Every day | ORAL | Status: DC
  Administered 2019-06-24 – 2019-07-12 (×18): 40 mg via ORAL
  Filled 2019-06-24 (×18): qty 1

## 2019-06-24 MED ORDER — DOCUSATE SODIUM 50 MG/5ML PO LIQD: 100.0000 mg | Freq: Two times a day (BID) | ORAL | Status: DC

## 2019-06-24 MED ORDER — LORAZEPAM 2 MG/ML IJ SOLN
INTRAMUSCULAR | Status: AC
  Filled 2019-06-24: qty 1

## 2019-06-24 MED ORDER — EPINEPHRINE HCL 5 MG/250ML IV SOLN IN NS
0.5000 ug/min | INTRAVENOUS | Status: DC
  Administered 2019-06-24: 12 ug/min via INTRAVENOUS
  Filled 2019-06-24: qty 250

## 2019-06-24 MED ORDER — FENTANYL CITRATE (PF) 100 MCG/2ML IJ SOLN
INTRAMUSCULAR | Status: AC
  Filled 2019-06-24: qty 2

## 2019-06-24 MED ORDER — FENTANYL CITRATE (PF) 100 MCG/2ML IJ SOLN: 25.0000 ug | Freq: Once | INTRAMUSCULAR | Status: AC

## 2019-06-24 MED ORDER — FENTANYL 2500MCG IN NS 250ML (10MCG/ML) PREMIX INFUSION
25.0000 ug/h | INTRAVENOUS | Status: DC
  Administered 2019-06-24: 50 ug/h via INTRAVENOUS
  Administered 2019-06-25: 150 ug/h via INTRAVENOUS
  Filled 2019-06-24 (×2): qty 250

## 2019-06-24 MED ORDER — FENTANYL CITRATE (PF) 100 MCG/2ML IJ SOLN
INTRAMUSCULAR | Status: DC | PRN
  Administered 2019-06-24: 50 ug via INTRAVENOUS

## 2019-06-24 MED ORDER — HEPARIN SODIUM (PORCINE) 1000 UNIT/ML IJ SOLN
INTRAMUSCULAR | Status: DC | PRN
  Administered 2019-06-24: 5000 [IU] via INTRAVENOUS

## 2019-06-24 MED ORDER — CLOPIDOGREL BISULFATE 75 MG PO TABS
75.0000 mg | ORAL_TABLET | Freq: Every day | ORAL | Status: DC
  Administered 2019-06-24: 75 mg via ORAL
  Filled 2019-06-24: qty 1

## 2019-06-24 MED ORDER — ALBUMIN HUMAN 5 % IV SOLN: INTRAVENOUS | Status: DC | PRN

## 2019-06-24 MED ORDER — PROPOFOL 10 MG/ML IV BOLUS
INTRAVENOUS | Status: AC
  Filled 2019-06-24: qty 20

## 2019-06-24 MED ORDER — VASOPRESSIN 20 UNIT/ML IV SOLN
0.0300 [IU]/min | INTRAVENOUS | Status: DC
  Filled 2019-06-24: qty 2

## 2019-06-24 MED ORDER — ROCURONIUM BROMIDE 100 MG/10ML IV SOLN
INTRAVENOUS | Status: DC | PRN
  Administered 2019-06-24: 20 mg via INTRAVENOUS
  Administered 2019-06-24: 50 mg via INTRAVENOUS

## 2019-06-24 MED ORDER — SODIUM CHLORIDE 0.9 % IV SOLN
INTRAVENOUS | Status: AC
  Filled 2019-06-24: qty 1.2

## 2019-06-24 MED ORDER — IODIXANOL 320 MG/ML IV SOLN
INTRAVENOUS | Status: DC | PRN
  Administered 2019-06-24: 19:00:00 50 mL

## 2019-06-24 MED ORDER — NITROGLYCERIN IN D5W 200-5 MCG/ML-% IV SOLN
0.0000 ug/min | INTRAVENOUS | Status: DC
  Administered 2019-06-24: 20 ug/min via INTRAVENOUS
  Filled 2019-06-24: qty 250

## 2019-06-24 SURGICAL SUPPLY — 48 items
APL SRG 7X2 LUM MLBL SLNT (VASCULAR PRODUCTS)
APPLICATOR TIP COSEAL (VASCULAR PRODUCTS) IMPLANT
BLADE CLIPPER SURG (BLADE) ×2 IMPLANT
BLADE SURG 11 STRL SS (BLADE) ×1 IMPLANT
CATH ACCU-VU SIZ PIG 5F 100CM (CATHETERS) ×1 IMPLANT
CATH DIAG EXPO 6F AL1 (CATHETERS) ×2 IMPLANT
CATH DIAG EXPO 6F FR4 (CATHETERS) ×1 IMPLANT
CATH INFINITI 6F MPB2 (CATHETERS) ×2 IMPLANT
CLIP VESOCCLUDE MED 24/CT (CLIP) ×1 IMPLANT
CLIP VESOCCLUDE SM WIDE 24/CT (CLIP) ×1 IMPLANT
DRAPE C-ARM 42X72 X-RAY (DRAPES) ×4 IMPLANT
DRAPE CARDIOVASC SPLIT 88X140 (DRAPES) ×1 IMPLANT
DRAPE CV SPLIT W-CLR ANES SCRN (DRAPES) ×2 IMPLANT
DRAPE HALF SHEET 40X57 (DRAPES) ×1 IMPLANT
DRAPE PERI GROIN 82X75IN TIB (DRAPES) ×3 IMPLANT
DRAPE SLUSH/WARMER DISC (DRAPES) ×2 IMPLANT
FELT TEFLON 1X6 (MISCELLANEOUS) ×1 IMPLANT
GAUZE SPONGE 4X4 12PLY STRL LF (GAUZE/BANDAGES/DRESSINGS) ×1 IMPLANT
GLOVE BIO SURGEON STRL SZ 6.5 (GLOVE) ×5 IMPLANT
GLOVE NEODERM STRL 7.5  LF PF (GLOVE) ×3
GLOVE NEODERM STRL 7.5 LF PF (GLOVE) ×3 IMPLANT
GLOVE SURG NEODERM 7.5  LF PF (GLOVE) ×3
GOWN STRL REUS W/ TWL LRG LVL3 (GOWN DISPOSABLE) ×4 IMPLANT
GOWN STRL REUS W/TWL LRG LVL3 (GOWN DISPOSABLE) ×8
GRAFT GELWEAVE IMPREG 10X30CM (Prosthesis & Implant Heart) ×1 IMPLANT
INSERT FOGARTY SM (MISCELLANEOUS) ×4 IMPLANT
KIT BASIN OR (CUSTOM PROCEDURE TRAY) ×2 IMPLANT
LOOP VESSEL MINI RED (MISCELLANEOUS) ×3 IMPLANT
NS IRRIG 1000ML POUR BTL (IV SOLUTION) ×8 IMPLANT
PACK CHEST (CUSTOM PROCEDURE TRAY) ×2 IMPLANT
PAD ARMBOARD 7.5X6 YLW CONV (MISCELLANEOUS) ×4 IMPLANT
PAD ELECT DEFIB RADIOL ZOLL (MISCELLANEOUS) ×2 IMPLANT
PUMP SET IMPELLA 5.5 US (CATHETERS) ×1 IMPLANT
SEALANT SURG COSEAL 8ML (VASCULAR PRODUCTS) ×3 IMPLANT
STAPLER VISISTAT 35W (STAPLE) ×1 IMPLANT
SUT ETHILON 3 0 PS 1 (SUTURE) ×6 IMPLANT
SUT PDS AB 1 CTX 36 (SUTURE) ×3 IMPLANT
SUT PROLENE 5 0 C 1 36 (SUTURE) ×3 IMPLANT
SUT SILK  1 MH (SUTURE) ×14
SUT SILK 1 MH (SUTURE) ×4 IMPLANT
SUT SILK 1 TIES 10X30 (SUTURE) ×3 IMPLANT
SUT SILK 2 0 SH CR/8 (SUTURE) ×1 IMPLANT
TAPE CLOTH SURG 4X10 WHT LF (GAUZE/BANDAGES/DRESSINGS) ×1 IMPLANT
TOWEL GREEN STERILE (TOWEL DISPOSABLE) ×2 IMPLANT
TOWEL GREEN STERILE FF (TOWEL DISPOSABLE) ×2 IMPLANT
TRAY FOLEY SLVR 16FR TEMP STAT (SET/KITS/TRAYS/PACK) ×2 IMPLANT
WATER STERILE IRR 1000ML POUR (IV SOLUTION) ×4 IMPLANT
WIRE EMERALD 3MM-J .035X260CM (WIRE) ×1 IMPLANT

## 2019-06-24 NOTE — Consult Note (Signed)
[address]       [place] [address]             [phone]        Tonja S Nolt [facility]ridge Medical Record [id number] Date of Birth: 01-06-1951  Referring: No ref. provider found Primary Care: Josetta Huddle, MD Primary Cardiologist:Philip Nahser, MD  Chief Complaint:   No chief complaint on file.   History of Present Illness:      Kindly asked to see patient who is s/p ABF and has a h/o CABG remotely. She was doing well until this pm when she experienced PEA arrest. She did respond quickly to resuscitative measures. Echo shows severely depressed LV function. Consult received for Impella support.   Current Activity/ Functional Status: Patient will be independent with mobility/ambulation, transfers, ADL's, IADL's.   Zubrod Score: At the time of surgery this patient's most appropriate activity status/level should be described as: []     0    Normal activity, no symptoms []     1    Restricted in physical strenuous activity but ambulatory, able to do out light work []     2    Ambulatory and capable of self care, unable to do work activities, up and about                 more than 50%  Of the time                            []     3    Only limited self care, in bed greater than 50% of waking hours []     4    Completely disabled, no self care, confined to bed or chair []     5    Moribund  Past Medical History:  Diagnosis Date  . Anemia   . Anginal pain ([facility])    jaw pain 07/2016, s/p 07/15/16 nuclear stress test  . Anxiety   . Arthritis    "lower back" (08/24/2017)  . Asthma   . CAD (coronary artery disease), native coronary artery    a. Hx CABG 1998, last PCI 2013 b.LHC 07/2013:  EF 55-60%, L-LAD prob atretic, no sig disease in LAD, S-OM1 ok with patent stent, S-dRCA ok => med Rx. c. LHC 01/2015 DES to SVG-RCA.  Marland Kitchen Chronic back pain   . Chronic leg pain   . Chronic lower back pain   . COPD (chronic obstructive pulmonary disease) ([facility])   . Depression     . DVT (deep venous thrombosis) ([facility]) X 3   RLE  . Fall 08/2016  . GERD (gastroesophageal reflux disease)   . Glucose intolerance (impaired glucose tolerance)   . Headache    "a few/month" (08/24/2017)  . HLD (hyperlipidemia)   . Hypertension   . Myocardial infarction ([facility]) 1999  . OSA on CPAP   . PAD (peripheral artery disease) ([facility])   . PAF (paroxysmal atrial fibrillation) ([facility])    a. 09/2013 post-op b. 01/2015  . Peripheral neuropathy   . Pneumonia   . Shortness of breath    when walking  . Wears glasses     Past Surgical History:  Procedure Laterality Date  . ABDOMINAL AORTOGRAM W/LOWER EXTREMITY Bilateral 04/29/2019   Procedure: ABDOMINAL AORTOGRAM W/LOWER EXTREMITY;  Surgeon: Waynetta Sandy, MD;  Location: [facility];  Service: Cardiovascular;  Laterality: Bilateral;  . ANTERIOR CERVICAL DECOMP/DISCECTOMY FUSION  [place] ARTERY BYPASS GRAFT N/A 06/18/2019   Procedure: AORTA BIFEMORAL BYPASS GRAFT;  Surgeon: Waynetta Sandy, MD;  Location: [facility];  Service: Vascular;  Laterality: N/A;  . BACK SURGERY    . CARDIAC CATHETERIZATION N/A 01/22/2015   Procedure: Left Heart Cath and Coronary Angiography;  Surgeon: Troy Sine, MD;  Location: [facility];  Service: Cardiovascular;  Laterality: N/A;  . CARDIAC CATHETERIZATION N/A 01/22/2015   Procedure: Coronary Stent Intervention;  Surgeon: Troy Sine, MD;  Location: [facility];  Service: Cardiovascular;  Laterality: N/A;  3.0x12 xience prox SVG to RCA  . CARDIAC CATHETERIZATION  03/09/2000   hx/notes 03/20/2000  . CHOLECYSTECTOMY N/A 09/16/2013   Procedure: LAPAROSCOPIC CHOLECYSTECTOMY CONVERTED TO OPEN CHOLECYSTECTOMY WITH CHOLANGIOGRAM;  Surgeon: [name], MD;  Location: [facility];  Service: General;  Laterality: N/A;  . CORONARY ANGIOPLASTY WITH STENT PLACEMENT  11/2011, 02/2012, 01/2015   DES to SVG-RCA both times, In [place], [place], Dr. Bruce Donath  .  CORONARY ARTERY BYPASS GRAFT  1998   LIMA-LAD, SVG-OM, SVG-RCA  . ERCP N/A 09/19/2013   Procedure: ENDOSCOPIC RETROGRADE CHOLANGIOPANCREATOGRAPHY (ERCP);  Surgeon: [name], MD;  Location: [facility];  Service: Endoscopy;  Laterality: N/A;  . FINGER SURGERY Right    ring finger "fused"  . FRACTURE SURGERY    . IRRIGATION AND DEBRIDEMENT ABSCESS Right 10/17/2013   Procedure: INCISION AND DRAINAGE RIGHT SUBCOSTAL WOUND;  Surgeon: [name], MD;  Location: [facility];  Service: General;  Laterality: Right;  . JOINT REPLACEMENT    . KNEE ARTHROSCOPY Right 1998  . LEFT HEART CATH AND CORS/GRAFTS ANGIOGRAPHY N/A 07/25/2016   Procedure: Left Heart Cath and Cors/Grafts Angiography;  Surgeon: Jettie Booze, MD;  Location: [facility];  Service: Cardiovascular;  Laterality: N/A;  . LEFT HEART CATH AND CORS/GRAFTS ANGIOGRAPHY N/A 06/05/2019   Procedure: LEFT HEART CATH AND CORS/GRAFTS ANGIOGRAPHY;  Surgeon: Burnell Blanks, MD;  Location: [facility];  Service: Cardiovascular;  Laterality: N/A;  . LEFT HEART CATHETERIZATION WITH CORONARY/GRAFT ANGIOGRAM N/A 07/19/2013   Procedure: LEFT HEART CATHETERIZATION WITH Beatrix Fetters;  Surgeon: Leonie Man, MD;  [facility];  Service: Cardiovascular;  Laterality: N/A;  . MASS EXCISION Right 06/25/2014   Procedure: EXCISION BUTTOCK MASS ;  Surgeon: Coralie Keens, MD;  Location: [facility];  Service: General;  Laterality: Right;  . ORIF HUMERUS FRACTURE Left 08/27/2013   Procedure: OPEN REDUCTION INTERNAL FIXATION (ORIF) LEFT HUMERUS ;  Surgeon: Rozanna Box, MD;  Location: [facility];  Service: Orthopedics;  Laterality: Left;  . POSTERIOR FUSION CERVICAL SPINE  1992  . THROMBECTOMY / EMBOLECTOMY FEMORAL ARTERY Right    hx/notes 03/20/2000  . TOTAL HIP ARTHROPLASTY Right 07/19/2016   Procedure: TOTAL HIP ARTHROPLASTY ANTERIOR APPROACH;  Surgeon: [name], MD;  Location: [facility];   Service: Orthopedics;  Laterality: Right;  . TOTAL HIP REVISION Right 08/22/2017   Procedure: TOTAL HIP REVISION;  Surgeon: [name], MD;  Location: [facility];  Service: Orthopedics;  Laterality: Right;    Social History   Tobacco Use  Smoking Status Former Smoker  . Packs/day: 0.50  . Years: 34.00  . Pack years: 17.00  . Types: Cigarettes  . Quit date: 07/20/1998  . Years since quitting: 20.9  Smokeless Tobacco Never Used    Social History   Substance and Sexual Activity  Alcohol Use Not Currently  Allergies  Allergen Reactions  . Benadryl [Diphenhydramine] Shortness Of Breath  . Penicillins Diarrhea    Did it involve swelling of the face/tongue/throat, SOB, or low BP? No Did it involve sudden or severe rash/hives, skin peeling, or any reaction on the inside of your mouth or nose? No Did you need to seek medical attention at a hospital or doctor's office? No When did it last happen?2 months If all above answers are "NO", may proceed with cephalosporin use.  . Adhesive [Tape] Other (See Comments)    Burning of skin  . Amoxicillin Diarrhea and Other (See Comments)    Has patient had a PCN reaction causing immediate rash, facial/tongue/throat swelling, SOB or lightheadedness with hypotension: No Has patient had a PCN reaction causing severe rash involving mucus membranes or skin necrosis: No Has patient had a PCN reaction that required hospitalization No Has patient had a PCN reaction occurring within the last 10 years: Yes -- noted rxn as diarrhea If all of the above answers are "NO", then may proceed with Cephalosporin use.   . Cyclobenzaprine Other (See Comments)    Causes restless leg syndrome Restless syndrome  . Hydrocodone Itching    Current Facility-Administered Medications  Medication Dose Route Frequency Provider Last Rate Last Admin  . 0.9 %  sodium chloride infusion (Manually program via Guardrails IV Fluids)   Intravenous Once Ulyses Amor,  PA-C      . acetaminophen (TYLENOL) tablet 325-650 mg  325-650 mg Oral Q4H PRN Laurence Slate M, PA-C       Or  . acetaminophen (TYLENOL) suppository 325-650 mg  325-650 mg Rectal Q4H PRN Ulyses Amor, PA-C      . ALPRAZolam Duanne Moron) tablet 0.5 mg  0.5 mg Oral TID PRN Dagoberto Ligas, PA-C      . alum & mag hydroxide-simeth (MAALOX/MYLANTA) 200-200-20 MG/5ML suspension 15-30 mL  15-30 mL Oral Q2H PRN Laurence Slate M, PA-C      . atorvastatin (LIPITOR) tablet 80 mg  80 mg Oral Daily Dagoberto Ligas, PA-C   80 mg at 06/24/19 [id number]  . Chlorhexidine Gluconate Cloth 2 % PADS 6 each  6 each Topical Daily Ulyses Amor, PA-C   6 each at 06/24/19 [id number]  . clopidogrel (PLAVIX) tablet 75 mg  75 mg Oral Daily Dagoberto Ligas, PA-C   75 mg at 06/24/19 [id number]  . diphenhydrAMINE (BENADRYL) injection 12.5 mg  12.5 mg Intravenous Q6H PRN Laurence Slate M, PA-C       Or  . diphenhydrAMINE (BENADRYL) 12.5 MG/5ML elixir 12.5 mg  12.5 mg Oral Q6H PRN Laurence Slate M, PA-C      . docusate (COLACE) 50 MG/5ML liquid 100 mg  100 mg Oral BID Welford Roche, MD      . EPINEPHrine (ADRENALIN) 4 mg in NS 250 mL (0.016 mg/mL) premix infusion  0.5-20 mcg/min Intravenous Titrated Icard, Bradley L, DO      . ezetimibe (ZETIA) tablet 10 mg  10 mg Oral Daily Dagoberto Ligas, PA-C   10 mg at 06/24/19 [id number]  . famotidine (PEPCID) IVPB 20 mg premix  20 mg Intravenous Q12H Santos-Sanchez, Idalys, MD      . fentaNYL (SUBLIMAZE) 100 MCG/2ML injection           . fentaNYL (SUBLIMAZE) bolus via infusion 25 mcg  25 mcg Intravenous Q15 min PRN Santos-Sanchez, Idalys, MD      . fentaNYL (SUBLIMAZE) injection 25 mcg  25 mcg Intravenous Once Welford Roche, MD      .  fentaNYL (SUBLIMAZE) injection 25-100 mcg  25-100 mcg Intravenous Q30 min PRN Santos-Sanchez, Merlene Morse, MD      . fentaNYL 2521mcg in NS 220mL (86mcg/ml) infusion-PREMIX  25-200 mcg/hr Intravenous Continuous Santos-Sanchez, Idalys, MD      . gabapentin (NEURONTIN)  capsule 300 mg  300 mg Oral TID PRN Dagoberto Ligas, PA-C      . guaiFENesin-dextromethorphan (ROBITUSSIN DM) 100-10 MG/5ML syrup 15 mL  15 mL Oral Q4H PRN Laurence Slate M, PA-C      . heparin 50,000 Units in dextrose 5 % 1,000 mL (50 Units/mL)  50,000 Units Intracatheter Continuous Carney, Gay Filler, RPH      . heparin ADULT infusion 100 units/mL (25000 units/255mL sodium chloride 0.45%)  1,200 Units/hr Intravenous Continuous Franky Macho, RPH 12 mL/hr at 06/24/19 1137 1,200 Units/hr at 06/24/19 1137  . LORazepam (ATIVAN) 2 MG/ML injection           . LORazepam (ATIVAN) injection 0.5 mg  0.5 mg Intravenous Once Dagoberto Ligas, PA-C      . MEDLINE mouth rinse  15 mL Mouth Rinse BID Laurence Slate M, PA-C   15 mL at 06/23/19 2134  . metoprolol tartrate (LOPRESSOR) injection 2-5 mg  2-5 mg Intravenous Q2H PRN Laurence Slate M, PA-C      . norepinephrine (LEVOPHED) 4-5 MG/250ML-% infusion SOLN           . norepinephrine (LEVOPHED) 4-5 MG/250ML-% infusion SOLN           . ondansetron (ZOFRAN) injection 4 mg  4 mg Intravenous Q6H PRN Laurence Slate M, PA-C   4 mg at 06/19/19 [id number]  . pantoprazole (PROTONIX) EC tablet 40 mg  40 mg Oral Daily Waynetta Sandy, MD   40 mg at 06/24/19 [id number]  . phenol (CHLORASEPTIC) mouth spray 1 spray  1 spray Mouth/Throat PRN Laurence Slate M, PA-C      . polyethylene glycol (MIRALAX / GLYCOLAX) packet 17 g  17 g Oral Daily Santos-Sanchez, Idalys, MD      . potassium chloride SA (KLOR-CON) CR tablet 20-40 mEq  20-40 mEq Oral Daily PRN Laurence Slate M, PA-C      . sodium bicarbonate 1 mEq/mL injection           . sodium chloride flush (NS) 0.9 % injection 10-40 mL  10-40 mL Intracatheter Q12H Laurence Slate M, PA-C   10 mL at 06/24/19 [id number]  . sodium chloride flush (NS) 0.9 % injection 10-40 mL  10-40 mL Intracatheter PRN Ulyses Amor, PA-C        Facility-Administered Medications Prior to Admission  Medication Dose Route Frequency Provider Last Rate Last Admin    . ferrous sulfate tablet 325 mg  325 mg Oral BID WC Irene Shipper, MD       Medications Prior to Admission  Medication Sig Dispense Refill Last Dose  . acetaminophen (TYLENOL) 500 MG tablet Take 500-1,000 mg by mouth every 6 (six) hours as needed (for pain.).   06/17/2019 at Unknown time  . albuterol (PROVENTIL HFA;VENTOLIN HFA) 108 (90 Base) MCG/ACT inhaler Inhale 2 puffs into the lungs every 6 (six) hours as needed for wheezing or shortness of breath. 1 each 1 Past Week at Unknown time  . ALPRAZolam (XANAX) 0.5 MG tablet Take 0.5 mg by mouth 3 (three) times daily as needed for anxiety.   06/17/2019 at Unknown time  . apixaban (ELIQUIS) 5 MG TABS tablet Take 1 tablet (5 mg total) by mouth 2 (two) times daily.  180 tablet 1 Past Week at Unknown time  . atorvastatin (LIPITOR) 80 MG tablet TAKE ONE TABLET BY MOUTH DAILY AT 6PM (Patient taking differently: Take 80 mg by mouth daily at 6 PM. TAKE ONE TABLET BY MOUTH DAILY AT 6PM) 90 tablet 3 06/17/2019 at Unknown time  . buPROPion (WELLBUTRIN XL) 150 MG 24 hr tablet Take 150 mg by mouth daily.   06/17/2019 at Unknown time  . ciprofloxacin (CIPRO) 500 MG tablet Take 1 tablet (500 mg total) by mouth 2 (two) times daily. 14 tablet 0 06/17/2019 at Unknown time  . clopidogrel (PLAVIX) 75 MG tablet Take 75 mg by mouth daily.   Past Week at Unknown time  . diclofenac Sodium (VOLTAREN) 1 % GEL Apply 1 g topically 4 (four) times daily as needed for pain.   Past Week at Unknown time  . ezetimibe (ZETIA) 10 MG tablet Take 10 mg by mouth daily.   06/18/2019 at 0430  . gabapentin (NEURONTIN) 300 MG capsule Take 300 mg by mouth 3 (three) times daily as needed for pain.   Past Week at Unknown time  . isosorbide mononitrate (IMDUR) 60 MG 24 hr tablet Take 1.5 tablets (90 mg total) by mouth every morning. (Patient taking differently: Take 60 mg by mouth daily. ) 135 tablet 0 06/18/2019 at 0430  . metoprolol tartrate (LOPRESSOR) 50 MG tablet Take 1 tablet (50 mg total) by mouth  2 (two) times daily. 180 tablet 3 06/18/2019 at 0430  . nitroGLYCERIN (NITROSTAT) 0.4 MG SL tablet Place 1 tablet (0.4 mg total) under the tongue every 5 (five) minutes as needed for chest pain. 25 tablet 5 06/17/2019 at Unknown time  . Oxycodone HCl 10 MG TABS Take 10 mg by mouth 3 (three) times daily.   06/17/2019 at Unknown time  . pantoprazole (PROTONIX) 40 MG tablet Take 1 tablet (40 mg total) by mouth daily. 90 tablet 3 06/17/2019 at Unknown time  . pramipexole (MIRAPEX) 0.25 MG tablet Take 0.25 mg by mouth at bedtime.    06/17/2019 at Unknown time  . ranolazine (RANEXA) 1000 MG SR tablet Take 1,000 mg by mouth 2 (two) times daily.   06/18/2019 at 0430  . sertraline (ZOLOFT) 100 MG tablet Take 150 mg by mouth at bedtime.    06/17/2019 at Unknown time  . valsartan-hydrochlorothiazide (DIOVAN-HCT) 160-12.5 MG tablet Take 1 tablet by mouth daily.    06/17/2019 at Unknown time  . furosemide (LASIX) 20 MG tablet Take 1 tablet (20 mg total) by mouth daily as needed. (Patient taking differently: Take 20 mg by mouth daily as needed for fluid. ) 30 tablet 3 More than a month at Unknown time    Family History  Problem Relation Age of Onset  . Lung cancer Mother   . Clotting disorder Father   . Lung cancer Sister   . Heart disease Brother   . Heart disease Brother   . Colon cancer Neg Hx   . Breast cancer Neg Hx      Review of Systems:   ROS Review of systems not obtained due to patient factors.     Cardiac Review of Systems: Y or  [    ]= no  Chest Pain [    ]  Resting SOB [   ] Exertional SOB  [  ]  Orthopnea [  ]   Pedal Edema [   ]    Palpitations [  ] Syncope  [  ]   Presyncope [   ]  General Review of Systems: [Y] = yes [  ]=no Constitional: recent weight change [  ]; anorexia [  ]; fatigue [  ]; nausea [  ]; night sweats [  ]; fever [  ]; or chills [  ]                                                               Dental: Last Dentist visit:   Eye : blurred vision [  ]; diplopia [   ];  vision changes [  ];  Amaurosis fugax[  ]; Resp: cough [  ];  wheezing[  ];  hemoptysis[  ]; shortness of breath[  ]; paroxysmal nocturnal dyspnea[  ]; dyspnea on exertion[  ]; or orthopnea[  ];  GI:  gallstones[  ], vomiting[  ];  dysphagia[  ]; melena[  ];  hematochezia [  ]; heartburn[  ];   Hx of  Colonoscopy[  ]; GU: kidney stones [  ]; hematuria[  ];   dysuria [  ];  nocturia[  ];  history of     obstruction [  ]; urinary frequency [  ]             Skin: rash, swelling[  ];, hair loss[  ];  peripheral edema[  ];  or itching[  ]; Musculosketetal: myalgias[  ];  joint swelling[  ];  joint erythema[  ];  joint pain[  ];  back pain[  ];  Heme/Lymph: bruising[  ];  bleeding[  ];  anemia[  ];  Neuro: TIA[  ];  headaches[  ];  stroke[  ];  vertigo[  ];  seizures[  ];   paresthesias[  ];  difficulty walking[  ];  Psych:depression[  ]; anxiety[  ];  Endocrine: diabetes[  ];  thyroid dysfunction[  ];           Physical Exam: BP (!) 150/118   Pulse (!) 130   Temp 97.6 F (36.4 C) (Oral)   Resp 18   Wt 75.9 kg   SpO2 95%   BMI 31.61 kg/m    General appearance: mild distress Neck: no adenopathy, no carotid bruit, no JVD, supple, symmetrical, trachea midline and thyroid not enlarged, symmetric, no tenderness/mass/nodules Resp: clear to auscultation bilaterally Cardio: tachycardiac Extremities: mottled; faint right brachial pulse Neurologic: Mental status: Alert, oriented, thought content appropriate, intubated/sedated; moving all extremities spontaneously  Diagnostic Studies & Laboratory data:     Recent Radiology Findings:   No results found.   I have independently reviewed the above radiologic studies and discussed with the patient   Recent Lab Findings: Lab Results  Component Value Date   WBC 11.9 (H) 06/24/2019   HGB 10.3 (L) 06/24/2019   HCT 34.1 (L) 06/24/2019   PLT 301 06/24/2019   GLUCOSE 113 (H) 06/24/2019   CHOL 98 (L) 12/05/2016   TRIG 73 12/05/2016   HDL 43  12/05/2016   LDLCALC 40 12/05/2016   ALT 13 06/19/2019   AST 20 06/19/2019   NA 146 (H) 06/24/2019   K 4.0 06/24/2019   CL 114 (H) 06/24/2019   CREATININE 1.26 (H) 06/24/2019   BUN 15 06/24/2019   CO2 20 (L) 06/24/2019   TSH 0.295 (L) 08/15/2016   INR 1.8 (H) 06/24/2019  HGBA1C 4.9 08/15/2016      Assessment / Plan:       To OR urgently for impella 5.5 LVAD insertion.    I  spent 15 minutes counseling the patient face to face.   Denitra Donaghey Z. Orvan Seen, MD [phone] 06/24/2019 3:46 PM

## 2019-06-24 NOTE — Significant Event (Signed)
Rapid Response Event Note  Overview: Hypotension, Respiratory Distress, Tachycardia  Initial Focused Assessment: Called to bedside urgently for assistance with patient in respiratory distress. Upon arrival, patient was in significant distress, skin - gray, clammy, diaphoretic, and mottling present in the entire trunk region of the body. Hands and feet were dusky/cold/and bluish in color. HR 140s - ST, SBP 50-70s -- at times we could not obtain a BP, radial pulses were not palpable but pedal and brachial pulses were present with doppler. Patient was immediately placed on NRB 15L and 1L NS bolus was started. [facility] PA/MD were paged to the bedside. Patient was somnolent - occasionally moaned, stated her abdomen was hurting - abdomen was soft - incision draining clear fluid (ongoing finding). BS 126  Patient did not improve after being placed on NRB 15L  and after bolus was infusing - BP did not improved and the mottling worsened. Patient was not as responsive as she was prior. MD was present, multiple attempts to obtain an ABG were attempted. Clinically the patient had progressed to decompensated shock and now needed to be intubated and further resuscitated.   I called the Anesthesia team and PCCM team asked them to meet the patient on 2H and I emergently transferred the patient to 2H13.  Interventions: -- NRB 15L -- 1L NS -- STAT ABG -- STAT LABS -- Emergent transfer to ICU -- Patient was still spontaneously breathing, HR was in the 130s, SBPs 50-70s while transferring to 2H - bolus infusing.   Plan of Care: -- Patient was emergently intubated upon arrival to [facility], pressors were started. STAT ABG and STAT LABs were obtained. After several minutes, patient arrested at [place], Code Blue was called, see Code Blue Sheet for details. ROSC achieved at 1510.   Event Summary:  Call Time [place]   Annett Boxwell R

## 2019-06-24 NOTE — Transfer of Care (Signed)
Immediate Anesthesia Transfer of Care Note  Patient: Wendy [name]  Procedure(s) Performed: PLACEMENT OF IMPELLA LEFT VENTRICULAR ASSIST DEVICE (N/A )  Patient Location: ICU  Anesthesia Type:General  Level of Consciousness: Patient remains intubated per anesthesia plan  Airway & Oxygen Therapy: Patient remains intubated per anesthesia plan and Patient placed on Ventilator (see vital sign flow sheet for setting)  Post-op Assessment: Report given to RN and Post -op Vital signs reviewed and stable  Post vital signs: Reviewed and stable  Last Vitals:  Vitals Value Taken Time  BP 143/86 06/24/19 2000  Temp 34.4 C 06/24/19 2006  Pulse 72 06/24/19 1950  Resp 20 06/24/19 2006  SpO2 77 % 06/24/19 1950  Vitals shown include unvalidated device data.  Last Pain:  Vitals:   06/24/19 1500  TempSrc:   PainSc: 0-No pain      Patients Stated Pain Goal: 2 ([id number])  Complications: No apparent anesthesia complications

## 2019-06-24 NOTE — Progress Notes (Addendum)
Patient ID: Wendy [name], female   DOB: [birth year], 69 y.o.   MRN: [id number]    Advanced Heart Failure Team Consult Note   Primary Physician: Josetta Huddle, MD PCP-Cardiologist:  [name], MD  Reason for Consultation: Cardiogenic shock  HPI:    Wendy [name] is seen today for evaluation of cardiogenic shock at the request of Dr. Valeta Harms.   69 y.o. with history of CAD s/p CABG, PAD, COPD, HTN was admitted for aortobifemoral bypass on 06/18/19. Pre-op echo in 4/21 showed EF 55-60%, low normal RV function, mild MR.  LHC in 3/21 showed occluded SVG-RCA and SVG-OM and atretic LIMA-LAD.  The LAD was patent with 60% mid vessel stenosis, the RCA was occluded with collaterals, and OM1 and OM2 were occluded.  No intervention.  Post-op course was complicated by atrial fibrillation with RVR and periods of hypotension requiring pressor support (norepinephrine and phenylephrine).  She developed AKI with creatinine peaking at 2.86, now down to 1.26 today.   She seemed to be doing better over the weekend.  However, she became anxious and short of breath and was noted to have a fall in oxygen saturation today.  She developed hypotension and was noted to be mottled from her feet to her lower abdomen.  No chest pain.  She was intubated and brought to [facility].  In [place], she had a PEA arrest.  Epinephrine and norepinephrine gtts were started.  Lactate was 8.3.  HS-TnI 1802.  ECG was done, she was in NSR with lateral TWIs but no STEMI.  Post-arrest, she was stable on NE/epi and was responsive to commands, opened eyes.    Echo was done at bedside and reviewed, EF 15-20% with global hypokinesis.  RV function relatively preserved and IVC not dilated.  This is a marked change compared to the report of the echo from pre-op.   Review of Systems: Unable to obtain, intubated  Home Medications Prior to Admission medications   Medication Sig Start Date End Date Taking? Authorizing Provider  acetaminophen (TYLENOL) 500 MG  tablet Take 500-1,000 mg by mouth every 6 (six) hours as needed (for pain.).   Yes [provider]  albuterol (PROVENTIL HFA;VENTOLIN HFA) 108 (90 Base) MCG/ACT inhaler Inhale 2 puffs into the lungs every 6 (six) hours as needed for wheezing or shortness of breath. 01/01/18  Yes Bhagat, Bhavinkumar, PA  ALPRAZolam (XANAX) 0.5 MG tablet Take 0.5 mg by mouth 3 (three) times daily as needed for anxiety.   Yes [provider]  apixaban (ELIQUIS) 5 MG TABS tablet Take 1 tablet (5 mg total) by mouth 2 (two) times daily. 11/20/18  Yes Nahser, Wonda Cheng, MD  atorvastatin (LIPITOR) 80 MG tablet TAKE ONE TABLET BY MOUTH DAILY AT 6PM Patient taking differently: Take 80 mg by mouth daily at 6 PM. TAKE ONE TABLET BY MOUTH DAILY AT 6PM 11/20/18  Yes Nahser, Wonda Cheng, MD  buPROPion (WELLBUTRIN XL) 150 MG 24 hr tablet Take 150 mg by mouth daily.   Yes [provider]  ciprofloxacin (CIPRO) 500 MG tablet Take 1 tablet (500 mg total) by mouth 2 (two) times daily. 06/14/19  Yes Waynetta Sandy, MD  clopidogrel (PLAVIX) 75 MG tablet Take 75 mg by mouth daily.   Yes [provider]  diclofenac Sodium (VOLTAREN) 1 % GEL Apply 1 g topically 4 (four) times daily as needed for pain. 02/15/19  Yes [provider]  ezetimibe (ZETIA) 10 MG tablet Take 10 mg by mouth daily.  Yes [provider]  gabapentin (NEURONTIN) 300 MG capsule Take 300 mg by mouth 3 (three) times daily as needed for pain. 02/15/19  Yes [provider]  isosorbide mononitrate (IMDUR) 60 MG 24 hr tablet Take 1.5 tablets (90 mg total) by mouth every morning. Patient taking differently: Take 60 mg by mouth daily.  02/15/19  Yes Nahser, Wonda Cheng, MD  metoprolol tartrate (LOPRESSOR) 50 MG tablet Take 1 tablet (50 mg total) by mouth 2 (two) times daily. 11/20/18  Yes Nahser, Wonda Cheng, MD  nitroGLYCERIN (NITROSTAT) 0.4 MG SL tablet Place 1 tablet (0.4 mg total) under the tongue every 5 (five)  minutes as needed for chest pain. 11/20/18  Yes Nahser, Wonda Cheng, MD  Oxycodone HCl 10 MG TABS Take 10 mg by mouth 3 (three) times daily. 04/11/19  Yes [provider]  pantoprazole (PROTONIX) 40 MG tablet Take 1 tablet (40 mg total) by mouth daily. 11/20/18  Yes Nahser, Wonda Cheng, MD  pramipexole (MIRAPEX) 0.25 MG tablet Take 0.25 mg by mouth at bedtime.  02/18/19  Yes [provider]  ranolazine (RANEXA) 1000 MG SR tablet Take 1,000 mg by mouth 2 (two) times daily.   Yes [provider]  sertraline (ZOLOFT) 100 MG tablet Take 150 mg by mouth at bedtime.    Yes [provider]  valsartan-hydrochlorothiazide (DIOVAN-HCT) 160-12.5 MG tablet Take 1 tablet by mouth daily.  03/14/19  Yes [provider]  furosemide (LASIX) 20 MG tablet Take 1 tablet (20 mg total) by mouth daily as needed. Patient taking differently: Take 20 mg by mouth daily as needed for fluid.  11/20/18 11/20/19  Nahser, Wonda Cheng, MD    Past Medical History: Past Medical History:  Diagnosis Date  . Anemia   . Anginal pain ([place])    jaw pain 07/2016, s/p 07/15/16 nuclear stress test  . Anxiety   . Arthritis    "lower back" (08/24/2017)  . Asthma   . CAD (coronary artery disease), native coronary artery    a. Hx CABG 1998, last PCI 2013 b.LHC 07/2013:  EF 55-60%, L-LAD prob atretic, no sig disease in LAD, S-OM1 ok with patent stent, S-dRCA ok => med Rx. c. LHC 01/2015 DES to SVG-RCA.  Marland Kitchen Chronic back pain   . Chronic leg pain   . Chronic lower back pain   . COPD (chronic obstructive pulmonary disease) ([place])   . Depression   . DVT (deep venous thrombosis) ([facility]) X 3   RLE  . Fall 08/2016  . GERD (gastroesophageal reflux disease)   . Glucose intolerance (impaired glucose tolerance)   . Headache    "a few/month" (08/24/2017)  . HLD (hyperlipidemia)   . Hypertension   . Myocardial infarction ([place]) 1999  . OSA on CPAP   . PAD (peripheral artery disease) ([place])   . PAF (paroxysmal atrial  fibrillation) ([place])    a. 09/2013 post-op b. 01/2015  . Peripheral neuropathy   . Pneumonia   . Shortness of breath    when walking  . Wears glasses     Past Surgical History: Past Surgical History:  Procedure Laterality Date  . ABDOMINAL AORTOGRAM W/LOWER EXTREMITY Bilateral 04/29/2019   Procedure: ABDOMINAL AORTOGRAM W/LOWER EXTREMITY;  Surgeon: Waynetta Sandy, MD;  Location: [facility];  Service: Cardiovascular;  Laterality: Bilateral;  . ANTERIOR CERVICAL DECOMP/DISCECTOMY FUSION  1991  . AORTA - BILATERAL FEMORAL ARTERY BYPASS GRAFT N/A 06/18/2019   Procedure: AORTA BIFEMORAL BYPASS GRAFT;  Surgeon: Servando Snare  Harrell Gave, MD;  Location: [facility];  Service: Vascular;  Laterality: N/A;  . BACK SURGERY    . CARDIAC CATHETERIZATION N/A 01/22/2015   Procedure: Left Heart Cath and Coronary Angiography;  Surgeon: Troy Sine, MD;  Location: [facility];  Service: Cardiovascular;  Laterality: N/A;  . CARDIAC CATHETERIZATION N/A 01/22/2015   Procedure: Coronary Stent Intervention;  Surgeon: Troy Sine, MD;  Location: [facility];  Service: Cardiovascular;  Laterality: N/A;  3.0x12 xience prox SVG to RCA  . CARDIAC CATHETERIZATION  03/09/2000   hx/notes 03/20/2000  . CHOLECYSTECTOMY N/A 09/16/2013   Procedure: LAPAROSCOPIC CHOLECYSTECTOMY CONVERTED TO OPEN CHOLECYSTECTOMY WITH CHOLANGIOGRAM;  Surgeon: [name], MD;  Location: [facility];  Service: General;  Laterality: N/A;  . CORONARY ANGIOPLASTY WITH STENT PLACEMENT  11/2011, 02/2012, 01/2015   DES to SVG-RCA both times, In [place], [place], Dr. Bruce Donath  . CORONARY ARTERY BYPASS GRAFT  1998   LIMA-LAD, SVG-OM, SVG-RCA  . ERCP N/A 09/19/2013   Procedure: ENDOSCOPIC RETROGRADE CHOLANGIOPANCREATOGRAPHY (ERCP);  Surgeon: [name], MD;  Location: [facility];  Service: Endoscopy;  Laterality: N/A;  . FINGER SURGERY Right    ring finger "fused"  . FRACTURE SURGERY    . IRRIGATION AND  DEBRIDEMENT ABSCESS Right 10/17/2013   Procedure: INCISION AND DRAINAGE RIGHT SUBCOSTAL WOUND;  Surgeon: [name], MD;  Location: [facility];  Service: General;  Laterality: Right;  . JOINT REPLACEMENT    . KNEE ARTHROSCOPY Right 1998  . LEFT HEART CATH AND CORS/GRAFTS ANGIOGRAPHY N/A 07/25/2016   Procedure: Left Heart Cath and Cors/Grafts Angiography;  Surgeon: Jettie Booze, MD;  Location: [facility];  Service: Cardiovascular;  Laterality: N/A;  . LEFT HEART CATH AND CORS/GRAFTS ANGIOGRAPHY N/A 06/05/2019   Procedure: LEFT HEART CATH AND CORS/GRAFTS ANGIOGRAPHY;  Surgeon: Burnell Blanks, MD;  Location: [facility];  Service: Cardiovascular;  Laterality: N/A;  . LEFT HEART CATHETERIZATION WITH CORONARY/GRAFT ANGIOGRAM N/A 07/19/2013   Procedure: LEFT HEART CATHETERIZATION WITH Beatrix Fetters;  Surgeon: Leonie Man, MD;  Location: [facility];  Service: Cardiovascular;  Laterality: N/A;  . MASS EXCISION Right 06/25/2014   Procedure: EXCISION BUTTOCK MASS ;  Surgeon: Coralie Keens, MD;  Location: [facility];  Service: General;  Laterality: Right;  . ORIF HUMERUS FRACTURE Left 08/27/2013   Procedure: OPEN REDUCTION INTERNAL FIXATION (ORIF) LEFT HUMERUS ;  Surgeon: Rozanna Box, MD;  Location: [facility];  Service: Orthopedics;  Laterality: Left;  . POSTERIOR FUSION CERVICAL SPINE  1992  . THROMBECTOMY / EMBOLECTOMY FEMORAL ARTERY Right    hx/notes 03/20/2000  . TOTAL HIP ARTHROPLASTY Right 07/19/2016   Procedure: TOTAL HIP ARTHROPLASTY ANTERIOR APPROACH;  Surgeon: [name], MD;  Location: [facility];  Service: Orthopedics;  Laterality: Right;  . TOTAL HIP REVISION Right 08/22/2017   Procedure: TOTAL HIP REVISION;  Surgeon: [name], MD;  Location: [facility];  Service: Orthopedics;  Laterality: Right;    Family History: Family History  Problem Relation Age of Onset  . Lung cancer Mother   . Clotting disorder Father   . Lung  cancer Sister   . Heart disease Brother   . Heart disease Brother   . Colon cancer Neg Hx   . Breast cancer Neg Hx     Social History: Social History   Socioeconomic History  . Marital status: Divorced    Spouse name: Not on file  . Number of children: 5  .  Years of education: Not on file  . Highest education level: Not on file  Occupational History  . Occupation: Disabled  Tobacco Use  . Smoking status: Former Smoker    Packs/day: 0.50    Years: 34.00    Pack years: 17.00    Types: Cigarettes    Quit date: 07/20/1998    Years since quitting: 20.9  . Smokeless tobacco: Never Used  Substance and Sexual Activity  . Alcohol use: Not Currently  . Drug use: Yes    Types: Marijuana  . Sexual activity: Not Currently  Other Topics Concern  . Not on file  Social History Narrative   Lives with best friend.    Social Determinants of Health   Financial Resource Strain:   . Difficulty of Paying Living Expenses:   Food Insecurity:   . Worried About Charity fundraiser in the Last Year:   . Arboriculturist in the Last Year:   Transportation Needs:   . Film/video editor (Medical):   Marland Kitchen Lack of Transportation (Non-Medical):   Physical Activity:   . Days of Exercise per Week:   . Minutes of Exercise per Session:   Stress:   . Feeling of Stress :   Social Connections:   . Frequency of Communication with Friends and Family:   . Frequency of Social Gatherings with Friends and Family:   . Attends Religious Services:   . Active Member of Clubs or Organizations:   . Attends Archivist Meetings:   Marland Kitchen Marital Status:     Allergies:  Allergies  Allergen Reactions  . Benadryl [Diphenhydramine] Shortness Of Breath  . Penicillins Diarrhea    Did it involve swelling of the face/tongue/throat, SOB, or low BP? No Did it involve sudden or severe rash/hives, skin peeling, or any reaction on the inside of your mouth or nose? No Did you need to seek medical attention at a  hospital or doctor's office? No When did it last happen?2 months If all above answers are "NO", may proceed with cephalosporin use.  . Adhesive [Tape] Other (See Comments)    Burning of skin  . Amoxicillin Diarrhea and Other (See Comments)    Has patient had a PCN reaction causing immediate rash, facial/tongue/throat swelling, SOB or lightheadedness with hypotension: No Has patient had a PCN reaction causing severe rash involving mucus membranes or skin necrosis: No Has patient had a PCN reaction that required hospitalization No Has patient had a PCN reaction occurring within the last 10 years: Yes -- noted rxn as diarrhea If all of the above answers are "NO", then may proceed with Cephalosporin use.   . Cyclobenzaprine Other (See Comments)    Causes restless leg syndrome Restless syndrome  . Hydrocodone Itching    Objective:    Vital Signs:   Temp:  [97.4 F (36.3 C)-98.6 F (37 C)] 97.6 F (36.4 C) (04/19 1125) Pulse Rate:  [32-130] 130 (04/19 1440) Resp:  [17-38] 18 (04/19 1440) BP: (82-150)/(66-118) 150/118 (04/19 1405) SpO2:  [56 %-100 %] 95 % (04/19 1440) FiO2 (%):  [70 %-100 %] 70 % (04/19 1521) Weight:  [75.9 kg] 75.9 kg (04/19 0529) Last BM Date: 06/23/19  Weight change: Filed Weights   06/22/19 0615 06/23/19 0442 06/24/19 0529  Weight: 75.2 kg 79.4 kg 75.9 kg    Intake/Output:   Intake/Output Summary (Last 24 hours) at 06/24/2019 1606 Last data filed at 06/24/2019 1118 Gross per 24 hour  Intake 486 ml  Output 625  ml  Net -139 ml      Physical Exam    General:  Intubated HEENT: normal Neck: supple. JVP difficult. Carotids 2+ bilat; no bruits. No lymphadenopathy or thyromegaly appreciated. Cor: PMI nondisplaced. Regular rate & rhythm. No rubs, gallops or murmurs. Lungs: Decreased  Abdomen: soft, nontender, nondistended. No hepatosplenomegaly. No bruits or masses. Good bowel sounds. Extremities: No edema.  Cyanotic toes.  Able to palpate PT pulses.   Neuro: Awake on vent, able to follow commands.    Telemetry   Atrial fibrillation => NSR 90s (personally reviewed)  EKG    NSR, lateral TWIs, similar to prior (personally reviewed)  Labs   Basic Metabolic Panel: Recent Labs  Lab 06/18/19 1240 06/18/19 1240 06/19/19 0244 06/19/19 1239 06/20/19 1322 06/20/19 1322 06/21/19 0508 06/21/19 0508 06/22/19 0341 06/22/19 0341 06/22/19 1616 06/22/19 1616 06/23/19 0448 06/24/19 0320 06/24/19 1458  NA 139   < > 138   < > 144   < > 144   < > 145  --  143  --  144 146* 144  K 4.4   < > 4.2   < > 4.5   < > 4.7   < > 4.3  --  4.5  --  4.1 4.0 5.2*  CL 109   < > 106   < > 112*   < > 116*   < > 118*  --  115*  --  117* 114* 116*  CO2 22   < > 23   < > 21*   < > 18*   < > 19*  --  17*  --  19* 20* 12*  GLUCOSE 196*   < > 150*   < > 120*   < > 91   < > 93  --  100*  --  98 113* 246*  BUN 25*   < > 31*   < > 31*   < > 29*   < > 25*  --  25*  --  19 15 15   CREATININE 1.40*   < > 2.28*   < > 2.81*   < > 2.46*   < > 2.14*  --  2.07*  --  1.77* 1.26* 1.33*  CALCIUM 8.0*   < > 7.9*   < > 8.1*   < > 8.3*   < > 8.1*   < > 8.2*   < > 8.3* 8.5* 7.5*  MG 1.4*  --  2.3  --  1.8  --   --   --   --   --   --   --   --   --   --   PHOS  --   --   --    < > 4.9*  --  4.9*  --  4.1  --   --   --  3.8 3.0  --    < > = values in this interval not displayed.    Liver Function Tests: Recent Labs  Lab 06/19/19 0244 06/19/19 2304 06/21/19 0508 06/22/19 0341 06/23/19 0448 06/24/19 0320 06/24/19 1458  AST 20  --   --   --   --   --  52*  ALT 13  --   --   --   --   --  20  ALKPHOS 38  --   --   --   --   --  99  BILITOT 0.5  --   --   --   --   --  0.8  PROT 5.0*  --   --   --   --   --  4.7*  ALBUMIN 2.3*   < > 2.4* 2.1* 2.0* 2.2* 1.8*   < > = values in this interval not displayed.   Recent Labs  Lab 06/19/19 0244  AMYLASE 61   No results for input(s): AMMONIA in the last 168 hours.  CBC: Recent Labs  Lab 06/20/19 1322 06/22/19 0341  06/22/19 2243 06/24/19 0320 06/24/19 1458  WBC 7.6 4.1 5.6 5.0 11.9*  NEUTROABS  --   --   --   --  7.9*  HGB 9.0* 7.7* 9.3* 10.3* 10.3*  HCT 28.8* 26.0* 29.8* 32.9* 34.1*  MCV 92.9 96.3 93.4 92.2 96.9  PLT 137* 122* 141* 162 301    Cardiac Enzymes: Recent Labs  Lab 06/24/19 1458  CKTOTAL 86  CKMB 11.2*    BNP: BNP (last 3 results) Recent Labs    06/19/19 1239  BNP 366.7*    ProBNP (last 3 results) No results for input(s): PROBNP in the last 8760 hours.   CBG: Recent Labs  Lab 06/19/19 2126 06/24/19 1401  GLUCAP 106* 128*    Coagulation Studies: Recent Labs    06/24/19 1458  LABPROT 20.8*  INR 1.8*     Imaging    No results found.   Medications:     Current Medications: . sodium chloride   Intravenous Once  . atorvastatin  80 mg Oral Daily  . Chlorhexidine Gluconate Cloth  6 each Topical Daily  . clopidogrel  75 mg Oral Daily  . docusate  100 mg Oral BID  . ezetimibe  10 mg Oral Daily  . LORazepam      . LORazepam  0.5 mg Intravenous Once  . mouth rinse  15 mL Mouth Rinse BID  . pantoprazole  40 mg Oral Daily  . polyethylene glycol  17 g Oral Daily  . sodium bicarbonate      . sodium chloride flush  10-40 mL Intracatheter Q12H     Infusions: . epinephrine    . famotidine (PEPCID) IV    . fentaNYL infusion INTRAVENOUS 50 mcg/hr (06/24/19 1551)  . impella catheter heparin 50 unit/mL in dextrose 5%    . heparin 1,200 Units/hr (06/24/19 1137)  . norepinephrine    . norepinephrine        Assessment/Plan   1. Cardiogenic shock: Progressive worsening through the day today culminating in PEA arrest.  Lactate 8.1.  She had episodes of hypotension and atrial fibrillation/RVR earlier in her post-op course but seemed to be recovering this weekend.  Symptoms were dyspnea and anxiety no chest pain.  No STEMI on ECG but concern for possible coronary event based on history.  Initial troponin in setting of shock was 1802.  She is currently  stabilized on epinephrine and norepinephrine gtts.   - Discussed with Dr. Orvan Seen, will try to stabilize her with Impella 5.5 placement.  She is not a candidate for femoral Impella or IABP with fresh aorto-bifemoral bypass.  - Will place Swan in OR.  - Trend lactate.  2. Acute systolic CHF: Suspect ischemic cardiomyopathy.  Echo today with EF 15-20%, diffuse hypokinesis, preserved RV function.  Concern for coronary event based on her prior history.  Pre-op echo with EF 60%.  HS-TnI not markedly elevated at this point ([id number]), so not certain if acute event or occurred earlier in hospitalization (had been on pressors earlier in course).  - Impella 5.5 as above.  -  Follow filling pressures with Swan.  - Pressors as needed to maintain MAP. Hopefully can titrate off with Impella placement.  3. AKI: Creatinine as high as 2.86, had started trending down and was 1.26 today.  Follow with shock.  4. CAD: LHC in 3/21 showed occluded SVG-RCA and SVG-OM and atretic LIMA-LAD.  The LAD was patent with 60% mid vessel stenosis, the RCA was occluded with collaterals, and OM1 and OM2 were occluded.  No intervention.  Surprisingly, EF reported as normal pre-op.  No chest pain but had dyspnea and anxiety today.  ECG did not show STEMI, showed fairly stable lateral TWIs.  Still concerned for possible coronary event as trigger for fall in EF and shock.   - Stabilize with Impella - Will need eventual repeat angiography.  - Continue statin, ASA, and Plavix.   - Will get heparin gtt with Impella.  5. PAD: S/p aortobifemoral bypass on 4/13.  Surgical site is stable, she has palpable PT pulses.  6. Acute hypoxemic respiratory failure: Intubated with PEA arrest.  7. Atrial fibrillation: Paroxysmal.  She has has been in and out of atrial fibrillation post-op.  Has had episodes of junctional rhythm also.   - Will start her on amiodarone gtt for now to maintain NSR.   Length of Stay: 6  Loralie Champagne, MD  06/24/2019, 4:06  PM  Advanced Heart Failure Team Pager [phone] (M-F; 7a - 4p)  Please contact [facility] for night-coverage after hours (4p -7a ) and weekends on [email or web address]

## 2019-06-24 NOTE — Progress Notes (Addendum)
Progress Note  Patient Name: Wendy [name] Date of Encounter: 06/24/2019  Primary Cardiologist: [name], MD   Subjective   In SR for the last 36 hours. HRs in 80-90'.  Inpatient Medications    Scheduled Meds: . sodium chloride   Intravenous Once  . atorvastatin  80 mg Oral Daily  . Chlorhexidine Gluconate Cloth  6 each Topical Daily  . clopidogrel  75 mg Oral Daily  . ezetimibe  10 mg Oral Daily  . mouth rinse  15 mL Mouth Rinse BID  . metoprolol tartrate  12.5 mg Oral BID  . pantoprazole  40 mg Oral Daily  . sodium chloride flush  10-40 mL Intracatheter Q12H   Continuous Infusions: . heparin 1,200 Units/hr (06/24/19 0445)   PRN Meds: acetaminophen **OR** acetaminophen, alum & mag hydroxide-simeth, diphenhydrAMINE **OR** diphenhydrAMINE, gabapentin, guaiFENesin-dextromethorphan, hydrALAZINE, metoprolol tartrate, morphine injection, ondansetron, oxyCODONE-acetaminophen, phenol, potassium chloride, sodium chloride flush   Vital Signs    Vitals:   06/24/19 0800 06/24/19 0900 06/24/19 0922 06/24/19 0923  BP:   134/83   Pulse: 90 88  90  Resp: 18 17 (!) 38   Temp:      TempSrc:      SpO2: 97% 97%    Weight:        Intake/Output Summary (Last 24 hours) at 06/24/2019 1100 Last data filed at 06/24/2019 0658 Gross per 24 hour  Intake 726 ml  Output 625 ml  Net 101 ml   Last 3 Weights 06/24/2019 06/23/2019 06/22/2019  Weight (lbs) 167 lb 4.8 oz 175 lb 0.7 oz 165 lb 12.6 oz  Weight (kg) 75.887 kg 79.4 kg 75.2 kg      Telemetry    Currently sinus in the 90 bpm range. Went from afib to sinus around 8 PM last night- Personally Reviewed  ECG    NSR 4/16 - Personally Reviewed  Physical Exam   GEN: Sitting in bed, appears more comfortable, no nasal cannula HEENT: Normal, moist mucous membranes NECK: No JVD visible CARDIAC: regular rhythm, normal S1 and S2, no rubs or gallops. 2/6 systolic murmur. VASCULAR: Radial and DP pulses 2+ bilaterally. RESPIRATORY:   Clear to auscultation without rales, wheezing or rhonchi  ABDOMEN: Soft, non-tender, non-distended, +bowel sounds MUSCULOSKELETAL:  Moves all 4 limbs independently SKIN: Warm and dry, no edema NEUROLOGIC:  No focal neuro deficits noted. PSYCHIATRIC:  Normal affect   Labs    High Sensitivity Troponin:   Recent Labs  Lab 06/19/19 1239 06/19/19 1435 06/19/19 2115  TROPONINIHS 26* 61* 165*      Chemistry Recent Labs  Lab 06/19/19 0244 06/19/19 1239 06/22/19 0341 06/22/19 0341 06/22/19 1616 06/23/19 0448 06/24/19 0320  NA 138   < > 145   < > 143 144 146*  K 4.2   < > 4.3   < > 4.5 4.1 4.0  CL 106   < > 118*   < > 115* 117* 114*  CO2 23   < > 19*   < > 17* 19* 20*  GLUCOSE 150*   < > 93   < > 100* 98 113*  BUN 31*   < > 25*   < > 25* 19 15  CREATININE 2.28*   < > 2.14*   < > 2.07* 1.77* 1.26*  CALCIUM 7.9*   < > 8.1*   < > 8.2* 8.3* 8.5*  PROT 5.0*  --   --   --   --   --   --  ALBUMIN 2.3*   < > 2.1*  --   --  2.0* 2.2*  AST 20  --   --   --   --   --   --   ALT 13  --   --   --   --   --   --   ALKPHOS 38  --   --   --   --   --   --   BILITOT 0.5  --   --   --   --   --   --   GFRNONAA 22*   < > 23*   < > 24* 29* 44*  GFRAA 25*   < > 27*   < > 28* 34* 51*  ANIONGAP 9   < > 8   < > 11 8 12    < > = values in this interval not displayed.     Hematology Recent Labs  Lab 06/22/19 0341 06/22/19 2243 06/24/19 0320  WBC 4.1 5.6 5.0  RBC 2.70* 3.19* 3.57*  HGB 7.7* 9.3* 10.3*  HCT 26.0* 29.8* 32.9*  MCV 96.3 93.4 92.2  MCH 28.5 29.2 28.9  MCHC 29.6* 31.2 31.3  RDW 16.6* 16.0* 15.9*  PLT 122* 141* 162    BNP Recent Labs  Lab 06/19/19 1239  BNP 366.7*     DDimer No results for input(s): DDIMER in the last 168 hours.   Radiology    No results found.  Cardiac Studies   Echo 06/17/19 1. Left ventricular ejection fraction, by estimation, is 55 to 60%. The  left ventricle has normal function. The left ventricle demonstrates  regional wall motion  abnormalities (see scoring diagram/findings for  description). Left ventricular diastolic  parameters were normal.  2. Right ventricular systolic function is low normal. The right  ventricular size is normal. There is moderately elevated pulmonary artery  systolic pressure.  3. The mitral valve is normal in structure. Mild mitral valve  regurgitation.  4. The aortic valve is normal in structure. Aortic valve regurgitation is  not visualized.  5. The inferior vena cava is normal in size with greater than 50%  respiratory variability, suggesting right atrial pressure of 3 mmHg.   Cath 06/05/19  Prox RCA to Mid RCA lesion is 100% stenosed.  Dist Cx lesion is 80% stenosed.  3rd Mrg lesion is 100% stenosed.  1st Mrg-1 lesion is 100% stenosed.  1st Mrg-2 lesion is 100% stenosed.  SVG graft was not visualized.  Origin to Prox Graft lesion is 100% stenosed.  SVG graft was not visualized.  Origin to Prox Graft lesion is 100% stenosed.  Prox LAD to Mid LAD lesion is 60% stenosed.  1st Diag lesion is 100% stenosed.  LIMA graft was not visualized.  Origin to Dist Graft lesion is 100% stenosed.  1. Stable three vessel CAD s/p 3V CABG with known occlusion of all three grafts.  2. Moderate, calcified mid LAD stenosis that is angiographically improved from cath in 2018.  3. Chronic occlusion proximal RCA. The vessel fills from left to right collaterals.  4. Chronic occlusion obtuse marginal and distal Circumflex.   Recommendations: Continue medical management of CAD. Proceed with planning for LE bypass surgery.   Patient Profile     69 y.o. female with a hx of PAD, CAD status post NSTEMI s/p CABG x3 (SVG-OM, LIMA-LAD, SVG-RCA in 1998) with PCI on 2013and 2015,PAF,hypertension, chronic combined systolic and diastolic heart failure, COPD, GERD, peripheral neuropathy who underwent aortobifemoral bypass  and reimplantation of right profunda femoris artery on aortobifemoral limb  on 06/18/19 by Dr. Donzetta Matters.  Assessment & Plan    Atrial fibrillation with RVR -previously this admission, afib RVR associated with hypotension requiring pressors and intermittent junctional rhythm. Has been in and out of afib post op. Responds well to digoxin. -keep K>4, Mg >2 -will increase metoprolol to 25 mg PO BID.  -will stop IV digoxin  Post-aortobifemoral bypass and reimplantation of right profunda femoris artery on aortobifemoral limb on 06/18/19 by Dr. Donzetta Matters, with post operative blood loss anemia -received 1 Unite PRBC 4/15 and another unit 4.17. -Hgb this AM 9.3, platelets 141 -preop Hgb 11.3 -ileus improving, had BM 4/15, tolerating clear liquids  Known severe CAD, s/p prior CABG -cath 06/05/19 showed stable 3V disease with known occlusion of all 3 bypass grafts -she will have demand ischemia with elevated heart rate or stress. Elevated troponin in line with this. -clopidogrel on hold, restart when able -on imdur, ranolazine as outpatient, on hold given recent hypotension  Hyperlipidemia, LDL goal <70 -on atorvastatin 80 mg, ezetimibe 10 mg as outpatient, on hold given recent ileus -restart when able  Paroxysmal atrial fibrillation -CHA2DS2/VAS Stroke Risk Points = 5 -on apixaban as outpatient -when clear from surgical standpoint, would start heparin drip or apixaban (if she is NPO vs. Taking oral meds). With anemia and thrombocytopenia, reasonable to hold until these numbers improve  Hypertension: -hypotensive post operatively, required levophed and then phenylephrine for BP support -BP improved this AM. Started oral metoprolol, as above -on valsartan-HCTZ as outpatient, on hold  Chronic diastolic heart failure (prior systolic heart failure) -most recent EF was preserved -has lasix PRN at home -appears euvolemic today, no diuresis planned  Acute renal dysfunction: Cr  1.26 from 1.77 today, was 1.17 prior to surgery.  -received blood transfusion 4/15,  4/17 -avoid nephrotoxic agents -was NPO, now clears.   Pending transfer to the floor today.  For questions or updates, please contact [place] Please consult [email or web address] for contact info under     Signed, Ena Dawley, MD  06/24/2019, 11:00 AM

## 2019-06-24 NOTE — Anesthesia Procedure Notes (Signed)
Procedure Name: Intubation Date/Time: 06/24/2019 2:38 PM Performed by: Catalina Gravel, MD Pre-anesthesia Checklist: Patient identified, Emergency Drugs available, Suction available and Patient being monitored Patient Re-evaluated:Patient Re-evaluated prior to induction Oxygen Delivery Method: Circle system utilized Preoxygenation: Pre-oxygenation with 100% oxygen Induction Type: IV induction and Rapid sequence Laryngoscope Size: Glidescope and 3 Grade View: Grade I Tube type: Oral Tube size: 7.5 mm Number of attempts: 1 Airway Equipment and Method: Video-laryngoscopy and Rigid stylet Placement Confirmation: ETT inserted through vocal cords under direct vision,  positive ETCO2,  breath sounds checked- equal and bilateral and CO2 detector Secured at: 22 cm Tube secured with: Tape Dental Injury: Teeth and Oropharynx as per pre-operative assessment

## 2019-06-24 NOTE — Progress Notes (Signed)
Patient resting in bed and in At. Fib. Rate 104-120 . Assist to bathroom and H.R. went as high as 150 At. Fib. Assist back to bed. She felt very weak . Skin was warm and diaphoretic V.S. S.B.P[facility] 141/76 MAP 97 H.R. coming down 114 Rm. Dentist. 99%. Patient feels better after a few minutes of rest. Will use B.S.C. tonight.

## 2019-06-24 NOTE — Progress Notes (Addendum)
1350 BP 82/66 (72) HR 120's. Patient has mottling in upper abdominal region   Rapid RN called at this time  1458 Rapid at bedside. Vascular called.   Dopplerable PT and DP pulses.   1405 RT at bedside

## 2019-06-24 NOTE — Progress Notes (Addendum)
[place] for heparin Indication: atrial fibrillation  Allergies  Allergen Reactions  . Benadryl [Diphenhydramine] Shortness Of Breath  . Penicillins Diarrhea    Did it involve swelling of the face/tongue/throat, SOB, or low BP? No Did it involve sudden or severe rash/hives, skin peeling, or any reaction on the inside of your mouth or nose? No Did you need to seek medical attention at a hospital or doctor's office? No When did it last happen?2 months If all above answers are "NO", may proceed with cephalosporin use.  . Adhesive [Tape] Other (See Comments)    Burning of skin  . Amoxicillin Diarrhea and Other (See Comments)    Has patient had a PCN reaction causing immediate rash, facial/tongue/throat swelling, SOB or lightheadedness with hypotension: No Has patient had a PCN reaction causing severe rash involving mucus membranes or skin necrosis: No Has patient had a PCN reaction that required hospitalization No Has patient had a PCN reaction occurring within the last 10 years: Yes -- noted rxn as diarrhea If all of the above answers are "NO", then may proceed with Cephalosporin use.   . Cyclobenzaprine Other (See Comments)    Causes restless leg syndrome Restless syndrome  . Hydrocodone Itching    Patient Measurements: Weight: 79.4 kg (175 lb 0.7 oz) Heparin Dosing Weight: 79 kg  Vital Signs: Temp: 97.4 F (36.3 C) (04/19 0010) Temp Source: Oral (04/19 0010) BP: 128/86 (04/19 0010) Pulse Rate: 76 (04/19 0010)  Labs: Recent Labs    06/22/19 0341 06/22/19 0341 06/22/19 1616 06/22/19 2243 06/23/19 0448 06/23/19 2021 06/24/19 0320  HGB 7.7*   < >  --  9.3*  --   --  10.3*  HCT 26.0*  --   --  29.8*  --   --  32.9*  PLT 122*  --   --  141*  --   --  162  HEPARINUNFRC  --   --   --   --   --  0.21* 0.26*  CREATININE 2.14*  --  2.07*  --  1.77*  --   --    < > = values in this interval not displayed.    Estimated  Creatinine Clearance: 29.4 mL/min (A) (by C-G formula based on SCr of 1.77 mg/dL (H)).  Assessment: 83 yoF s/p peripheral bypass 4/13. Pt has hx AFib on apixaban PTA which has been held perioperatively. Pharmacy asked to begin IV heparin with no bolus. Will start low and titrate slowly after discussion with VVS.  Heparin level remains subtherapeutic (0.26) on gtt at 1050 units/hr. No issues with line or bleeding reported per RN. Hgb up to 10.3.  Goal of Therapy:  Heparin level 0.3-0.7 units/ml Monitor platelets by anticoagulation protocol: Yes   Plan:  -Increase Heparin to 1200 units/h -Daily heparin level, CBC  Thank you Anette Guarneri, PharmD Please see amion for complete clinical pharmacist phone list 06/24/2019 4:40 AM

## 2019-06-24 NOTE — Plan of Care (Signed)
Continue to monitor

## 2019-06-24 NOTE — Progress Notes (Signed)
ANTICOAGULATION CONSULT NOTE - Follow-Up Consult  Pharmacy Consult for Heparin Indication: Afib + new Impella  Patient Measurements: Weight: 75.9 kg (167 lb 4.8 oz)  Ideal body weight: 47.8 kg (105 lb 6.1 oz) Adjusted ideal body weight: 59 kg (130 lb 2.4 oz)  Heparin Dosing Weight: 65 kg  Vital Signs: Temp: 97.6 F (36.4 C) (04/19 1125) Temp Source: Oral (04/19 1125) BP: 91/60 (04/19 1615) Pulse Rate: 113 (04/19 1600)  Labs: Recent Labs    06/22/19 1616 06/22/19 2243 06/22/19 2243 06/23/19 0448 06/23/19 2021 06/24/19 0320 06/24/19 0320 06/24/19 1458 06/24/19 1459  HGB  --  9.3*   < >  --   --  10.3*   < > 10.3* 10.2*  HCT  --  29.8*   < >  --   --  32.9*  --  34.1* 30.0*  PLT  --  141*  --   --   --  162  --  301  --   LABPROT  --   --   --   --   --   --   --  20.8*  --   INR  --   --   --   --   --   --   --  1.8*  --   HEPARINUNFRC  --   --   --   --  0.21* 0.26*  --   --   --   CREATININE   < >  --   --  1.77*  --  1.26*  --  1.33*  --   CKTOTAL  --   --   --   --   --   --   --  86  --   CKMB  --   --   --   --   --   --   --  11.2*  --   TROPONINIHS  --   --   --   --   --   --   --  1,802*  --    < > = values in this interval not displayed.    Estimated Creatinine Clearance: 38.2 mL/min (A) (by C-G formula based on SCr of 1.33 mg/dL (H)).   Medications:  Infusions:  . amiodarone    . amiodarone    . epinephrine Stopped (06/24/19 1719)  . famotidine (PEPCID) IV    . fentaNYL infusion INTRAVENOUS 50 mcg/hr (06/24/19 1551)  . impella catheter heparin 50 unit/mL in dextrose 5%    . nitroGLYCERIN      Assessment: 61 YOF who underwent aortobifemoral bypass on 4/13 and was noted to have post-op Afib with RVR and was initiated on Heparin IV. On 4/19 the patient went into respiratory distress requiring intubation, followed by a cardiac arrest. ECHO at the time showed a low EF of 10-15% and the patient was taken emergently to the OR for Impella placement. The  patient is now s/p OR and pharmacy is monitor for anticoagulation with Heparin given the Impella + Afib.  Discussed with Dr. Orvan Seen who still wishes to wait 6 hours post-op before starting systemic heparin. The patient currently has heparin running in the purge solution at 5 ml/hr (250 units/hr). Will obtain a 6h post-op HL and if <0.3 - will plan to initiate systemic Heparin and target a goal range of 0.3-0.5 for Impella + Afib.   Goal of Therapy:  Heparin level 0.3-0.5 units/ml Monitor platelets by anticoagulation protocol: Yes   Plan:  -  No systemic Heparin for now - Heparin in purge running at 5 ml/hr (250 units/hr) - Will obtain a 6h post OR HL and plan to initiate systemic heparin when HL <0.3  Thank you for allowing pharmacy to be a part of this patient's care.  Alycia Rossetti, PharmD, BCPS Clinical Pharmacist Clinical phone for 06/24/2019: [phone] 06/24/2019 8:32 PM   **Pharmacist phone directory can now be found on [email or web address] ([email or web address]).  Listed under [place].

## 2019-06-24 NOTE — Progress Notes (Addendum)
  Progress Note    06/24/2019 7:46 AM 6 Days Post-Op  Subjective:  Bowel movement this morning.  Patient would like a regular diet.  No nausea or vomiting with clears.   Vitals:   06/24/19 0010 06/24/19 0529  BP: 128/86 (!) 142/86  Pulse: 76 93  Resp: 20 17  Temp: (!) 97.4 F (36.3 C) 98 F (36.7 C)  SpO2: 99% 97%   Physical Exam: Lungs:  Non labored Incisions:  Midline incision c/d/i; groin incisions healing well Extremities:  Palpable DP pulses Abdomen:  Soft, NT, ND Neurologic: A&O  CBC    Component Value Date/Time   WBC 5.0 06/24/2019 0320   RBC 3.57 (L) 06/24/2019 0320   HGB 10.3 (L) 06/24/2019 0320   HGB 13.4 06/04/2019 1036   HCT 32.9 (L) 06/24/2019 0320   HCT 41.7 06/04/2019 1036   PLT 162 06/24/2019 0320   PLT 205 06/04/2019 1036   MCV 92.2 06/24/2019 0320   MCV 86 06/04/2019 1036   MCH 28.9 06/24/2019 0320   MCHC 31.3 06/24/2019 0320   RDW 15.9 (H) 06/24/2019 0320   RDW 17.4 (H) 06/04/2019 1036   LYMPHSABS 1.0 09/07/2017 1800   MONOABS 0.4 09/07/2017 1800   EOSABS 0.1 09/07/2017 1800   BASOSABS 0.1 09/07/2017 1800    BMET    Component Value Date/Time   NA 146 (H) 06/24/2019 0320   NA 141 06/04/2019 1036   K 4.0 06/24/2019 0320   CL 114 (H) 06/24/2019 0320   CO2 20 (L) 06/24/2019 0320   GLUCOSE 113 (H) 06/24/2019 0320   BUN 15 06/24/2019 0320   BUN 14 06/04/2019 1036   CREATININE 1.26 (H) 06/24/2019 0320   CREATININE 0.96 08/13/2015 0939   CALCIUM 8.5 (L) 06/24/2019 0320   GFRNONAA 44 (L) 06/24/2019 0320   GFRAA 51 (L) 06/24/2019 0320    INR    Component Value Date/Time   INR 1.4 (H) 06/18/2019 1240     Intake/Output Summary (Last 24 hours) at 06/24/2019 0746 Last data filed at 06/24/2019 [id number] Gross per 24 hour  Intake 1086 ml  Output 625 ml  Net 461 ml     Assessment/Plan:  69 y.o. female is s/p aortobifemoral bypass 6 Days Post-Op   BLE well perfused with palpable DP pulses Continue dry dressing changes to groins daily;  no tape BM this morning; advance to regular diet Restart some of home p.o. meds including plavix Hgb improving post transfusion; continue IV heparin; transition back to Eliquis when closer to discharge Tachycardic overnight when walking to bathroom; will defer to Cardiology   Dagoberto Ligas, PA-C [facility] [phone] 06/24/2019 7:46 AM  I have seen and evaluated the patient. I agree with the PA note as documented above.  Status post aortobifemoral bypass.  Patient has palpable dorsalis pedis signals bilaterally.  Keep dry dressings in the groins with a little serous drainage from the right groin.  Tolerating heparin drip and appreciate cardiac cardiology input for chronic A. fib.  Creatinine improved to 1.26 in setting of AKI.  White count stable. hemoglobin stable.  Will give regular diet today.  Had a BM.  Marty Heck, MD [facility] Office: [phone]

## 2019-06-24 NOTE — Code Documentation (Addendum)
CODE DOCUMENTATION:  Patient arrived to the ICU with acute hypoxic respiratory failure resulting in intubation. Shortly after arrival to the unit she became hypotensive and unresponsive. Went into PEA arrest at 1505. She received 2 amps of epi, 3 amps of bicarb, 1g calcium gluconate, and 2mg  of magnesium. She was started on levophed 30 mcg/hr and epi 10 mcg/hr. RLE IO placed during code. ROSC at 1510.   Advanced heart failure, vascular surgery, and cardiothoracic surgery now at bedside to discuss role of mechanical support.   Ina Homes, MD  [facility] PGY3  Pager: [phone]  PCCM attending:  I was present at bedside to help guide Dr. Tarri Abernethy during the resuscitative efforts.  ROSC was obtained.  [facility] 06/24/2019 6:32 PM

## 2019-06-24 NOTE — Progress Notes (Signed)
Patient back in S.R.90.

## 2019-06-24 NOTE — Progress Notes (Signed)
Physical Therapy Treatment Patient Details Name: Wendy [name] MRN: [id number] DOB: [birth year] Today's Date: 06/24/2019    History of Present Illness Pt is a 69yo female s/p aortobifemoral bypass due to critical R LE ischemia. PMHx: L to R femorofemoral bypass. PMHx: COPD, CAD, HTN, past MI, PVDx, cardiac stents, RT THA, neuropathy, HLD, OSA    PT Comments    Patient not progressing towards goals today secondary to dyspnea on exertion and fatigue. Requires supervision-Min guard assist for bed mobility, transfers and gait training with use of RW. Pt noted to have 3-4/4 DOE with short distance ambulation. Sp02 in 90s on RA. HR ranged from 80-143 bpm. Declined further mobility, "I cannot do it." Encouraged sitting in chair, using IS and walking with nursing a few times per day as pt reports she has only been getting on the [facility]. Will follow.    Follow Up Recommendations  Home health PT;Supervision/Assistance - 24 hour     Equipment Recommendations  None recommended by PT    Recommendations for Other Services       Precautions / Restrictions Precautions Precautions: Fall Precaution Comments: watch HR Restrictions Weight Bearing Restrictions: No    Mobility  Bed Mobility Overal bed mobility: Needs Assistance Bed Mobility: Rolling;Sidelying to Sit;Sit to Supine Rolling: Min guard Sidelying to sit: Min guard;HOB elevated   Sit to supine: Min guard;HOB elevated   General bed mobility comments: Use of rail to get to EOB. Able to reposition self in bed.  Transfers Overall transfer level: Needs assistance Equipment used: Rolling walker (2 wheeled) Transfers: Sit to/from [facility] Sit to Stand: Min guard Stand pivot transfers: Min guard       General transfer comment: Min guard for safety. Stood from EOB x2, from [facility] x1, SPT bed to/from [facility] x1. Cues for hand placement.  Ambulation/Gait Ambulation/Gait assistance: Min guard Gait Distance (Feet): 50  Feet Assistive device: Rolling walker (2 wheeled) Gait Pattern/deviations: Step-through pattern;Decreased stride length;Trunk flexed Gait velocity: varying speeds, faster when wanting tor eturn to room Gait velocity interpretation: 1.31 - 2.62 ft/sec, indicative of limited community ambulator General Gait Details: Slow, steady gait. 3-4/4 DOE with short distance with pt returning to room. "I cannot do it." Sp02 remained in 90s on RA. HR 80-143 bpm.   Stairs             Wheelchair Mobility    Modified Rankin (Stroke Patients Only)       Balance Overall balance assessment: History of Falls;Needs assistance Sitting-balance support: Feet supported;No upper extremity supported Sitting balance-Leahy Scale: Fair     Standing balance support: During functional activity Standing balance-Leahy Scale: Poor Standing balance comment: Requires UE support for standing and total A for pericare                            Cognition Arousal/Alertness: Awake/alert Behavior During Therapy: WFL for tasks assessed/performed Overall Cognitive Status: Within Functional Limits for tasks assessed                                 General Comments: Appears WFL; not forthcoming with information. self limiting due to SOB.      Exercises      General Comments General comments (skin integrity, edema, etc.): Sp02 in 90s during mobility. HR up to 143 bpm.      Pertinent Vitals/Pain Pain Assessment: Faces Faces Pain Scale:  Hurts little more Pain Location: abdomen Pain Descriptors / Indicators: Aching;Sore Pain Intervention(s): Repositioned;Monitored during session    Home Living                      Prior Function            PT Goals (current goals can now be found in the care plan section) Progress towards PT goals: Not progressing toward goals - comment(DOE)    Frequency    Min 3X/week      PT Plan Current plan remains appropriate     Co-evaluation              AM-PAC PT "6 Clicks" Mobility   Outcome Measure  Help needed turning from your back to your side while in a flat bed without using bedrails?: A Little Help needed moving from lying on your back to sitting on the side of a flat bed without using bedrails?: A Little Help needed moving to and from a bed to a chair (including a wheelchair)?: A Little Help needed standing up from a chair using your arms (e.g., wheelchair or bedside chair)?: A Little Help needed to walk in hospital room?: A Little Help needed climbing 3-5 steps with a railing? : A Lot 6 Click Score: 17    End of Session Equipment Utilized During Treatment: Gait belt Activity Tolerance: Treatment limited secondary to medical complications (Comment)(dypsnea) Patient left: in bed;with call bell/phone within reach;with bed alarm set Nurse Communication: Mobility status PT Visit Diagnosis: Other abnormalities of gait and mobility (R26.89);Difficulty in walking, not elsewhere classified (R26.2)     Time: [id number] PT Time Calculation (min) (ACUTE ONLY): 24 min  Charges:  $Gait Training: 8-22 mins $Therapeutic Activity: 8-22 mins                     Marisa Severin, PT, DPT Acute Rehabilitation Services Pager [phone] Office [place] 06/24/2019, 12:28 PM

## 2019-06-24 NOTE — Progress Notes (Signed)
Occupational Therapy Treatment Patient Details Name: SEBELLA CHAWLA MRN: [id number] DOB: [birth year] Today's Date: 06/24/2019    History of present illness Pt is a 69yo female s/p aortobifemoral bypass due to critical R LE ischemia. PMHx: L to R femorofemoral bypass. PMHx: COPD, CAD, HTN, past MI, PVDx, cardiac stents, RT THA, neuropathy, HLD, OSA   OT comments  Pt seen this AM prior to PEA arrest today. Pt with decline in function with ADL and mobility. Pt on 2L O2 >90% and HR  <125 BPM throughout bathing/grooming/dressing session in sitting. Pt modA overall for tasks. Pt unable to properly care for self due to increased lethargy and decreased mobility. Pt would greatly benefit from higher level of intensive therapy. OT following acutely.    Follow Up Recommendations  CIR    Equipment Recommendations  3 in 1 bedside commode;Other (comment)(deferred to next venue)    Recommendations for Other Services Rehab consult    Precautions / Restrictions Precautions Precautions: Fall Restrictions Weight Bearing Restrictions: No       Mobility Bed Mobility Overal bed mobility: Needs Assistance Bed Mobility: Sit to Supine       Sit to supine: Min guard;HOB elevated   General bed mobility comments: Pt assist with BLEsto safely get into bed.  Transfers Overall transfer level: Needs assistance Equipment used: Rolling walker (2 wheeled) Transfers: Sit to/from [facility] Sit to Stand: Min guard Stand pivot transfers: Min guard       General transfer comment: stood x1 from [facility] and transferred to bed    Balance Overall balance assessment: History of Falls;Needs assistance Sitting-balance support: Feet supported;No upper extremity supported Sitting balance-Leahy Scale: Fair     Standing balance support: During functional activity Standing balance-Leahy Scale: Fair Standing balance comment: Pt standing for pericare with minguardA                            ADL either performed or assessed with clinical judgement   ADL Overall ADL's : Needs assistance/impaired     Grooming: Minimal assistance;Sitting Grooming Details (indicate cue type and reason): Able to comb hair 3 brush strokes and then required a rest break Upper Body Bathing: Moderate assistance;Sitting Upper Body Bathing Details (indicate cue type and reason): Pt difficult to arouse and very fatigued after minimal exertion Lower Body Bathing: Moderate assistance Lower Body Bathing Details (indicate cue type and reason): Pt difficult to arouse and very fatigued after minimal exertion         Toilet Transfer: Min guard;BSC   Toileting- Clothing Manipulation and Hygiene: Minimal assistance;Sit to/from stand;Sitting/lateral lean Toileting - Clothing Manipulation Details (indicate cue type and reason): Pt performing own pericare with <10 secs of standing     Functional mobility during ADLs: Minimal assistance General ADL Comments: Pt limited by decreased activity tolerance and decreased ability to care for self. Pt difficult to arouse and very fatigued after minimal exertion     Vision   Vision Assessment?: No apparent visual deficits   Perception     Praxis      Cognition Arousal/Alertness: Lethargic Behavior During Therapy: Flat affect Overall Cognitive Status: Difficult to assess                                 General Comments: Pt very hard to arouse for completion of questions. Pt requiring increased time for tasks.  Exercises     Shoulder Instructions       General Comments Sp02 in 90s during ADL tasks on 2L O2 HR up to 125 bpm. Pt stating "I just feel so tired."    Pertinent Vitals/ Pain       Pain Assessment: Faces Faces Pain Scale: Hurts little more Pain Location: abdomen Pain Descriptors / Indicators: Aching;Sore Pain Intervention(s): Repositioned  Home Living                                           Prior Functioning/Environment              Frequency  Min 2X/week        Progress Toward Goals  OT Goals(current goals can now be found in the care plan section)  Progress towards OT goals: Not progressing toward goals - comment(Pt decreased ability to perform ADL tasks today)  Acute Rehab OT Goals Patient Stated Goal: to feel stronger OT Goal Formulation: With patient Time For Goal Achievement: 07/04/19 Potential to Achieve Goals: Good ADL Goals Pt Will Perform Grooming: with min guard assist;standing Pt Will Perform Lower Body Bathing: with min guard assist;sit to/from stand;sitting/lateral leans Pt Will Perform Lower Body Dressing: with min guard assist;sitting/lateral leans;sit to/from stand Pt Will Transfer to Toilet: with modified independence;ambulating;regular height toilet;grab bars Additional ADL Goal #1: Pt will recall and apply 1-3 ECS strategies to BADL activity  Plan Discharge plan remains appropriate    Co-evaluation                 AM-PAC OT "6 Clicks" Daily Activity     Outcome Measure   Help from another person eating meals?: Total Help from another person taking care of personal grooming?: A Lot Help from another person toileting, which includes using toliet, bedpan, or urinal?: A Lot Help from another person bathing (including washing, rinsing, drying)?: A Lot Help from another person to put on and taking off regular upper body clothing?: A Lot Help from another person to put on and taking off regular lower body clothing?: A Lot 6 Click Score: 11    End of Session Equipment Utilized During Treatment: Gait belt;Oxygen  OT Visit Diagnosis: Unsteadiness on feet (R26.81);Muscle weakness (generalized) (M62.81)   Activity Tolerance Patient tolerated treatment well   Patient Left in bed;with call bell/phone within reach;with bed alarm set   Nurse Communication Mobility status        Time: [id number] OT Time Calculation (min): 19  min  Charges: OT General Charges $OT Visit: 1 Visit OT Treatments $Self Care/Home Management : 8-22 mins  Jefferey Pica, OTR/L Acute Rehabilitation Services Pager: [phone] Office: [phone]    Lysha Schrade C 06/24/2019, 4:31 PM

## 2019-06-24 NOTE — Anesthesia Procedure Notes (Addendum)
Central Venous Catheter Insertion Performed by: Murvin Natal, MD, anesthesiologist Start/End4/19/2021 4:45 PM, 06/24/2019 5:00 PM Patient location: OR. Preanesthetic checklist: patient identified, IV checked, site marked, risks and benefits discussed, surgical consent, monitors and equipment checked, pre-op evaluation, timeout performed and anesthesia consent Position: Trendelenburg Patient sedated Hand hygiene performed , maximum sterile barriers used  and Seldinger technique used Catheter size: 9 Fr Total catheter length 12. PA cath was placed.MAC introducer Swan type:thermodilution PA Cath depth:52 Procedure performed using ultrasound guided technique. Ultrasound Notes:anatomy identified, needle tip was noted to be adjacent to the nerve/plexus identified and no ultrasound evidence of intravascular and/or intraneural injection Attempts: 1 Following insertion, line sutured, dressing applied and Biopatch. Post procedure assessment: blood return through all ports, free fluid flow and no air  Patient tolerated the procedure well with no immediate complications.

## 2019-06-24 NOTE — Anesthesia Preprocedure Evaluation (Signed)
Anesthesia Evaluation  Patient identified by MRN, date of birth, ID band Patient unresponsive    Reviewed: Allergy & Precautions, Patient's Chart, lab work & pertinent test resultsPreop documentation limited or incomplete due to emergent nature of procedure.  Airway Mallampati: Intubated       Dental   Pulmonary asthma , sleep apnea and Continuous Positive Airway Pressure Ventilation , COPD,  COPD inhaler, former smoker,  Intubated      + intubated    Cardiovascular hypertension, Pt. on home beta blockers + CAD, + Past MI, + CABG, + Peripheral Vascular Disease, +CHF and + DVT  + dysrhythmias Atrial Fibrillation  Rate:Tachycardia  ECHO: 1. Left ventricular ejection fraction, by estimation, is 20 to 25%. The left ventricle has severely decreased function. The left ventricle demonstrates global hypokinesis. Left ventricular diastolic parameters are consistent with Grade III diastolic dysfunction (restrictive). 2. Right ventricular systolic function is normal. The right ventricular size is normal. 3. The mitral valve is normal in structure. Mild mitral valve regurgitation. No evidence of mitral stenosis. 4. The aortic valve is normal in structure. Aortic valve regurgitation is not visualized. No aortic stenosis is present. 5. The inferior vena cava is normal in size with greater than 50% respiratory variability, suggesting right atrial pressure of 3 mmHg.   Neuro/Psych  Headaches, PSYCHIATRIC DISORDERS Anxiety Depression Sedated Peripheral neuropathy    GI/Hepatic Neg liver ROS, GERD  Medicated,  Endo/Other  negative endocrine ROS  Renal/GU negative Renal ROS     Musculoskeletal Chronic leg pain   Abdominal   Peds  Hematology  (+) anemia , HLD   Anesthesia Other Findings Acute MI  Reproductive/Obstetrics                             Anesthesia Physical Anesthesia Plan  ASA: IV and  emergent  Anesthesia Plan: General   Post-op Pain Management:    Induction: Intravenous  PONV Risk Score and Plan: 3 and Ondansetron, Midazolam, Dexamethasone and Treatment may vary due to age or medical condition  Airway Management Planned: Oral ETT  Additional Equipment: Arterial line, CVP, PA Cath, TEE and Ultrasound Guidance Line Placement  Intra-op Plan:   Post-operative Plan: Post-operative intubation/ventilation  Informed Consent: I have reviewed the patients History and Physical, chart, labs and discussed the procedure including the risks, benefits and alternatives for the proposed anesthesia with the patient or authorized representative who has indicated his/her understanding and acceptance.     Consent reviewed with POA  Plan Discussed with: CRNA  Anesthesia Plan Comments: (Request by cardiology for Pulmonary artery catheter TEE for intraoperative monitoring only)        Anesthesia Quick Evaluation

## 2019-06-24 NOTE — Progress Notes (Signed)
  Echocardiogram Echocardiogram Transesophageal has been performed.  Wendy [name] 06/24/2019, 5:43 PM

## 2019-06-24 NOTE — Progress Notes (Signed)
*  PRELIMINARY RESULTS* Echocardiogram 2D Echocardiogram has been performed.  Leavy Cella 06/24/2019, 3:43 PM

## 2019-06-24 NOTE — Progress Notes (Signed)
Into patients room. Patient claims she is having a a panic attack and has these often at home. Patient's 02 was high 88-89. O2 2 liters placed on patient O2 96%.  PA notified. New ordered received.

## 2019-06-24 NOTE — Op Note (Signed)
Procedure(s): PLACEMENT OF IMPELLA LEFT VENTRICULAR ASSIST DEVICE Procedure Note  Wendy [name] female 69 y.o. 06/24/2019  Procedure(s) and Anesthesia Type:    * PLACEMENT OF IMPELLA LEFT VENTRICULAR ASSIST DEVICE - General  Surgeon(s) and Role:    * Wonda Olds, MD - Primary   Indications: The patient is s/p Aorto-bifemoral bypass and had PEA arrest earlier today. She has known CAD. TTE after arrest shows severely reduced LV function. We have discussed the best therapy as a shock team and conclude that addition of mechanical support is warranted. Due to recent surgery, groin access if contraindicated, leaving axillary implantation of support the best option.      Surgeon: Wonda Olds   Assistants: staff  Anesthesia: General endotracheal anesthesia  ASA Class: 4    Procedure Detail  PLACEMENT OF IMPELLA LEFT VENTRICULAR ASSIST DEVICE  After informed consent from the family, the patient is taken to the OR and placed in the supine position on the operating table.  Anesthesia was begun in a smooth fashion by the general endotracheal technique.  Deep invasive monitoring lines were placed.  Next the anterior neck chest and abdomen are cleansed and draped sterilely with Betadine soap and paint.  A preop surgical pause was performed.  An incision was made in the right infraclavicular fossa overlying the deltopectoral groove.  Dissection was carried down through the subcutaneous tissue into the pectoralis major minor muscles with electrocautery.  The right axillary artery was encircled.  5000 units of heparin was given intravenously.  The axillary artery is in clamped and opened.  A 10 mm dacryon graft is brought onto the field and sewn end-to-side to the open axillary artery with a running suture of 5-0 Prolene.  The graft was then tunneled through a separate stab incision laterally and inferiorly.  Using this graft as a conduit and Impella 5.5 temporary LVAD device is inserted  under fluoroscopic and TEE guidance with a modified Seldinger technique.  The pump is positioned across the aortic valve and hardware is withdrawn.  The pump was then turned on.  Correct positioning of the pump within the left ventricle is ascertained by echo.  The device is and secured at the skin level.  The wound at the infraclavicular fossa was closed in layers.  Staples were used to reapproximate the skin; sterile dressings are applied.  All sponge instruments and needle counts were correct and I was present and participated in all aspects of the procedure.  Estimated Blood Loss:  less than 100 mL         Drains: no          Blood Given: 300 CC PRBC          Specimens: no         Implants: none        Complications:  * No complications entered in OR log *         Disposition: ICU - intubated and critically ill.         Condition: stable  Thoams Siefert Z. Orvan Seen, [place]

## 2019-06-24 NOTE — Progress Notes (Signed)
eLink Physician-Brief Progress Note Patient Name: Wendy [name] DOB: [birth year] MRN: [id number]   Date of Service  06/24/2019  HPI/Events of Note  Asked to review CXR - Can't assess definite ETT tip d/t overlying hardware (sternal wires, saphenous vein graft osteal markers and Impella). Gastric tube in distal esophagus.   eICU Interventions  Will order: 1. Advance gastric tube 6 cm. 2. Obtain abdominal film for gastric tube placement after advancing.      Intervention Category Major Interventions: Other:  Lysle Dingwall 06/24/2019, 10:36 PM

## 2019-06-24 NOTE — Progress Notes (Signed)
69 year old female postop day 6 status post aortobifemoral bypass by Dr. Donzetta Matters.  She had made good progress through the weekend and moved to the floor with rate control of her RVR and restarted on heparin and tolerating regular diet with palpable pedal pulses and improving renal function.  She had some acute event this afternoon where she became anxious and then clammy and could not breathe and then hypotensive tachycardic and mottled appearing.  She has now been intubated and transferred back to the ICU in critical condition.  Appreciate critical care assistance with management and will get full set of labs and stat echocardiogram to further work-up the etiology.  I have updated her family.    Marty Heck, MD [facility] Office: [place]

## 2019-06-24 NOTE — Anesthesia Procedure Notes (Signed)
Arterial Line Insertion Start/End4/19/2021 4:30 PM, 06/24/2019 4:45 PM Performed by: Murvin Natal, MD, anesthesiologist  Patient location: OR. Preanesthetic checklist: patient identified, IV checked, site marked, risks and benefits discussed, surgical consent, monitors and equipment checked, pre-op evaluation, timeout performed and anesthesia consent Patient sedated Left, brachial was placed Catheter size: 20 Fr Hand hygiene performed , maximum sterile barriers used  and Seldinger technique used  Attempts: 1 Procedure performed without using ultrasound guided technique. Following insertion, dressing applied, line sutured and Biopatch. Post procedure assessment: normal and unchanged  Patient tolerated the procedure well with no immediate complications.

## 2019-06-24 NOTE — Brief Op Note (Signed)
06/24/2019  7:59 PM  PATIENT:  Wendy [name]  69 y.o. female  PRE-OPERATIVE DIAGNOSIS:  Acute MI  POST-OPERATIVE DIAGNOSIS:  Acute MI  PROCEDURE:  Procedure(s): PLACEMENT OF IMPELLA LEFT VENTRICULAR ASSIST DEVICE (N/A)  SURGEON:  Surgeon(s) and Role:    * Wonda Olds, MD - Primary  PHYSICIAN ASSISTANT:   ASSISTANTS: staff   ANESTHESIA:   general   BLOOD ADMINISTERED:none  DRAINS: none   LOCAL MEDICATIONS USED:  NONE  SPECIMEN:  No Specimen  DISPOSITION OF SPECIMEN:  PATHOLOGY  COUNTS:  YES  TOURNIQUET:  * No tourniquets in log *  DICTATION: .Note written in EPIC  PLAN OF CARE: Admit to inpatient   PATIENT DISPOSITION:  ICU - intubated and critically ill.   Delay start of Pharmacological VTE agent (>24hrs) due to surgical blood loss or risk of bleeding: yes

## 2019-06-24 NOTE — Progress Notes (Addendum)
Patient arrived emergently to 2H13. Bedside report received. SBP in the 70s. HR 120s. Patient emergently intubated upon arrival. Heparin gtt and fluids running. Patient connected to monitor. Levophed gtt hung through peripheral IV. Multiple RNs and MDs at bedside. ABG and other labs drawn. PH 6.9 on ABG. 3 amps of sodium bicarbonate being given. At 1505 patient PEA 40s. CPR started. See code sheet in chart. Patient resuscitated at 1510. Epi gtt started during code. Patient prepped for OR for impella placement. Report given to CRNA at bedside. Patient sent to OR on levophed at 25, epi at 10, and fentanyl at 50.

## 2019-06-24 NOTE — Consult Note (Addendum)
NAME:  Wendy [name], MRN:  [id number], DOB:  [birth year], LOS: 6 ADMISSION DATE:  06/18/2019, CONSULTATION DATE:  06/24/2019 REFERRING MD:  Dr. Carlis Abbott CHIEF COMPLAINT:  Hypoxia   Brief History   Ms. Wendy [name] is a 70 y/o female with a PMHx of CAD, HFpEF, severe PAD who presents to the hospital for a scheduled aortobifemoral bypass. Her admission has been complicated by acute hypoxic respiratory failure today that was preceded by diaphoresis, AMS, and tachycardia. She subsequently went into A. Fib with RVR and received intubation for airway protection. PCCM was consulted for ventilation management.   Past Medical History   Past Medical History:  Diagnosis Date  . Anemia   . Anginal pain ([place])    jaw pain 07/2016, s/p 07/15/16 nuclear stress test  . Anxiety   . Arthritis    "lower back" (08/24/2017)  . Asthma   . CAD (coronary artery disease), native coronary artery    a. Hx CABG 1998, last PCI 2013 b.LHC 07/2013:  EF 55-60%, L-LAD prob atretic, no sig disease in LAD, S-OM1 ok with patent stent, S-dRCA ok => med Rx. c. LHC 01/2015 DES to SVG-RCA.  Marland Kitchen Chronic back pain   . Chronic leg pain   . Chronic lower back pain   . COPD (chronic obstructive pulmonary disease) ([place])   . Depression   . DVT (deep venous thrombosis) ([facility]) X 3   RLE  . Fall 08/2016  . GERD (gastroesophageal reflux disease)   . Glucose intolerance (impaired glucose tolerance)   . Headache    "a few/month" (08/24/2017)  . HLD (hyperlipidemia)   . Hypertension   . Myocardial infarction ([place]) 1999  . OSA on CPAP   . PAD (peripheral artery disease) ([place])   . PAF (paroxysmal atrial fibrillation) ([place])    a. 09/2013 post-op b. 01/2015  . Peripheral neuropathy   . Pneumonia   . Shortness of breath    when walking  . Wears glasses    Significant Hospital Events   4/13: Admitted 4/14: A. Fib with RVR 4/19: PEA cardiac arrest with ROSC  Consults:  Vascular Surgery, Cardiology, Nephrology  Procedures:  4/13:  Aorto-bifemoral bypass and reimplantation of right profunda femoris artery on aortobifemoral limb by Dr. Donzetta Matters  Significant Diagnostic Tests:   Echo 06/17/19 1. Left ventricular ejection fraction, by estimation, is 55 to 60%. The  left ventricle has normal function. The left ventricle demonstrates  regional wall motion abnormalities (see scoring diagram/findings for  description). Left ventricular diastolic  parameters were normal.  2. Right ventricular systolic function is low normal. The right  ventricular size is normal. There is moderately elevated pulmonary artery  systolic pressure.  3. The mitral valve is normal in structure. Mild mitral valve  regurgitation.  4. The aortic valve is normal in structure. Aortic valve regurgitation is  not visualized.  5. The inferior vena cava is normal in size with greater than 50%  respiratory variability, suggesting right atrial pressure of 3 mmHg.  Micro Data:  N/A  Antimicrobials:  N/A  Interim history/subjective:   PCCM was consulted after patient became acutely ill this AM. She was initially eating well and denied any complaints. Later in the early afternoon, patient became acutely hypoxic, diaphoretic and tachycardic. She eventually required intubation for airway protection and management of hypoxia.   On initial evaluation, patient was still being stabilized with intubation. While in the room, she was noted to have worsening hypoxia with increasingly low MAPs. She went into  PEA arrest. ROSC was achieved after 6 minutes, no shocks administered.   Cardiology, Vascular Surgery and TCTS were present at bedside.   Objective   Blood pressure (!) 150/118, pulse (!) 130, temperature 97.6 F (36.4 C), temperature source Oral, resp. rate 18, weight 75.9 kg, SpO2 95 %.    Vent Mode: PRVC FiO2 (%):  [70 %-100 %] 70 % Set Rate:  [18 bmp-28 bmp] 28 bmp Vt Set:  [400 mL] 400 mL PEEP:  [5 cmH20] 5 cmH20 Plateau Pressure:  [24 cmH20] 24  cmH20   Intake/Output Summary (Last 24 hours) at 06/24/2019 1533 Last data filed at 06/24/2019 1118 Gross per 24 hour  Intake 486 ml  Output 625 ml  Net -139 ml   Filed Weights   06/22/19 0615 06/23/19 0442 06/24/19 0529  Weight: 75.2 kg 79.4 kg 75.9 kg   Physical Exam Vitals and nursing note reviewed.  Constitutional:      Appearance: She is obese. She is ill-appearing.  Pulmonary:     Effort: Respiratory distress present.  Skin:    General: Skin is cool.     Coloration: Skin is cyanotic and mottled.  Neurological:     Mental Status: She is disoriented.    Resolved Hospital Problem list   None   Assessment & Plan:   # Acute Hypoxic Respiratory Failure  Secondary to a. Fib with RVR and subsequent cardiogenic shock  - Full vent support with VAP precautions  [facility] @ 8 cc/kg   # Cardiogenic Shock # s/p PEA Arrest   Hx of severe triple vessel CAS with onset of A fib with RVR today that likely led to an ischemic event as noted on bedside echo that showed severe left ventricular wall motion abnormality. [facility] was contacted who has discussed case with TCTS. Tentative plan to take to OR for Impella placement.   - Continue pressor support to maintain MAP > 65 - Management per [facility] and TCTS  Best practice:  Diet: NPO Pain/Anxiety/Delirium protocol (if indicated): Ordered VAP protocol (if indicated): Ordered DVT prophylaxis: Heparin GI prophylaxis: Pepcid Glucose control: Not indicated Mobility: Bedrest  Code Status: FULL Family Communication: Sister updated via telephone by Dr. Carlis Abbott and Dr. Benjamine Mola Disposition: ICU  Labs   CBC: Recent Labs  Lab 06/20/19 1322 06/22/19 0341 06/22/19 2243 06/24/19 0320 06/24/19 1458  WBC 7.6 4.1 5.6 5.0 11.9*  NEUTROABS  --   --   --   --  PENDING  HGB 9.0* 7.7* 9.3* 10.3* 10.3*  HCT 28.8* 26.0* 29.8* 32.9* 34.1*  MCV 92.9 96.3 93.4 92.2 96.9  PLT 137* 122* 141* 162 [id number]    Basic Metabolic Panel: Recent Labs    Lab 06/18/19 1240 06/18/19 1240 06/19/19 0244 06/19/19 1239 06/20/19 1322 06/20/19 1322 06/21/19 0508 06/22/19 0341 06/22/19 1616 06/23/19 0448 06/24/19 0320  NA 139   < > 138   < > 144   < > 144 145 143 144 146*  K 4.4   < > 4.2   < > 4.5   < > 4.7 4.3 4.5 4.1 4.0  CL 109   < > 106   < > 112*   < > 116* 118* 115* 117* 114*  CO2 22   < > 23   < > 21*   < > 18* 19* 17* 19* 20*  GLUCOSE 196*   < > 150*   < > 120*   < > 91 93 100* 98 113*  BUN 25*   < >  31*   < > 31*   < > 29* 25* 25* 19 15  CREATININE 1.40*   < > 2.28*   < > 2.81*   < > 2.46* 2.14* 2.07* 1.77* 1.26*  CALCIUM 8.0*   < > 7.9*   < > 8.1*   < > 8.3* 8.1* 8.2* 8.3* 8.5*  MG 1.4*  --  2.3  --  1.8  --   --   --   --   --   --   PHOS  --   --   --    < > 4.9*  --  4.9* 4.1  --  3.8 3.0   < > = values in this interval not displayed.   GFR: Estimated Creatinine Clearance: 40.4 mL/min (A) (by C-G formula based on SCr of 1.26 mg/dL (H)). Recent Labs  Lab 06/22/19 0341 06/22/19 2243 06/24/19 0320 06/24/19 1449 06/24/19 1458  WBC 4.1 5.6 5.0  --  11.9*  LATICACIDVEN  --   --   --  8.3*  --     Liver Function Tests: Recent Labs  Lab 06/19/19 0244 06/19/19 2304 06/20/19 1322 06/21/19 0508 06/22/19 0341 06/23/19 0448 06/24/19 0320  AST 20  --   --   --   --   --   --   ALT 13  --   --   --   --   --   --   ALKPHOS 38  --   --   --   --   --   --   BILITOT 0.5  --   --   --   --   --   --   PROT 5.0*  --   --   --   --   --   --   ALBUMIN 2.3*   < > 2.7* 2.4* 2.1* 2.0* 2.2*   < > = values in this interval not displayed.   Recent Labs  Lab 06/19/19 0244  AMYLASE 61   No results for input(s): AMMONIA in the last 168 hours.  ABG    Component Value Date/Time   PHART 7.306 (L) 06/18/2019 1308   PCO2ART 47.7 06/18/2019 1308   PO2ART 133 (H) 06/18/2019 1308   HCO3 23.2 06/18/2019 1308   TCO2 26 06/18/2019 1220   ACIDBASEDEF 2.3 (H) 06/18/2019 1308   O2SAT 98.5 06/18/2019 1308     Coagulation  Profile: Recent Labs  Lab 06/18/19 1240 06/24/19 1458  INR 1.4* 1.8*    Cardiac Enzymes: No results for input(s): CKTOTAL, CKMB, CKMBINDEX, TROPONINI in the last 168 hours.  HbA1C: Hgb A1c MFr Bld  Date/Time Value Ref Range Status  08/15/2016 04:39 PM 4.9 4.8 - 5.6 % Final    Comment:    (NOTE)         Pre-diabetes: 5.7 - 6.4         Diabetes: >6.4         Glycemic control for adults with diabetes: <7.0     CBG: Recent Labs  Lab 06/19/19 2126 06/24/19 1401  GLUCAP 106* 128*    Review of Systems:   Unable to obtain   Past Medical History  She,  has a past medical history of Anemia, Anginal pain (H[facility]), Anxiety, Arthritis, Asthma, CAD (coronary artery disease), native coronary artery, Chronic back pain, Chronic leg pain, Chronic lower back pain, COPD (chronic obstructive pulmonary disease) ([place]), Depression, DVT (deep venous thrombosis) ([place]) (X 3), Fall (08/2016), GERD (gastroesophageal reflux disease),  Glucose intolerance (impaired glucose tolerance), Headache, HLD (hyperlipidemia), Hypertension, Myocardial infarction ([place]) (1999), OSA on CPAP, PAD (peripheral artery disease) ([place]), PAF (paroxysmal atrial fibrillation) ([place]), Peripheral neuropathy, Pneumonia, Shortness of breath, and Wears glasses.   Surgical History    Past Surgical History:  Procedure Laterality Date  . ABDOMINAL AORTOGRAM W/LOWER EXTREMITY Bilateral 04/29/2019   Procedure: ABDOMINAL AORTOGRAM W/LOWER EXTREMITY;  Surgeon: Waynetta Sandy, MD;  Location: [place] [facility];  Service: Cardiovascular;  Laterality: Bilateral;  . ANTERIOR CERVICAL DECOMP/DISCECTOMY FUSION  1991  . AORTA - BILATERAL FEMORAL ARTERY BYPASS GRAFT N/A 06/18/2019   Procedure: AORTA BIFEMORAL BYPASS GRAFT;  Surgeon: Waynetta Sandy, MD;  Location: [facility];  Service: Vascular;  Laterality: N/A;  . BACK SURGERY    . CARDIAC CATHETERIZATION N/A 01/22/2015   Procedure: Left Heart Cath and Coronary Angiography;   Surgeon: Troy Sine, MD;  Location: [facility];  Service: Cardiovascular;  Laterality: N/A;  . CARDIAC CATHETERIZATION N/A 01/22/2015   Procedure: Coronary Stent Intervention;  Surgeon: Troy Sine, MD;  Location: [place] [facility];  Service: Cardiovascular;  Laterality: N/A;  3.0x12 xience prox SVG to RCA  . CARDIAC CATHETERIZATION  03/09/2000   hx/notes 03/20/2000  . CHOLECYSTECTOMY N/A 09/16/2013   Procedure: LAPAROSCOPIC CHOLECYSTECTOMY CONVERTED TO OPEN CHOLECYSTECTOMY WITH CHOLANGIOGRAM;  Surgeon: [name], MD;  Location: [facility];  Service: General;  Laterality: N/A;  . CORONARY ANGIOPLASTY WITH STENT PLACEMENT  11/2011, 02/2012, 01/2015   DES to SVG-RCA both times, In [place], [place], Dr. Bruce Donath  . CORONARY ARTERY BYPASS GRAFT  1998   LIMA-LAD, SVG-OM, SVG-RCA  . ERCP N/A 09/19/2013   Procedure: ENDOSCOPIC RETROGRADE CHOLANGIOPANCREATOGRAPHY (ERCP);  Surgeon: [name], MD;  Location: [facility];  Service: Endoscopy;  Laterality: N/A;  . FINGER SURGERY Right    ring finger "fused"  . FRACTURE SURGERY    . IRRIGATION AND DEBRIDEMENT ABSCESS Right 10/17/2013   Procedure: INCISION AND DRAINAGE RIGHT SUBCOSTAL WOUND;  Surgeon: [name], MD;  Location: [facility];  Service: General;  Laterality: Right;  . JOINT REPLACEMENT    . KNEE ARTHROSCOPY Right 1998  . LEFT HEART CATH AND CORS/GRAFTS ANGIOGRAPHY N/A 07/25/2016   Procedure: Left Heart Cath and Cors/Grafts Angiography;  Surgeon: Jettie Booze, MD;  Location: [place] [facility];  Service: Cardiovascular;  Laterality: N/A;  . LEFT HEART CATH AND CORS/GRAFTS ANGIOGRAPHY N/A 06/05/2019   Procedure: LEFT HEART CATH AND CORS/GRAFTS ANGIOGRAPHY;  Surgeon: Burnell Blanks, MD;  Location: [place] [facility];  Service: Cardiovascular;  Laterality: N/A;  . LEFT HEART CATHETERIZATION WITH CORONARY/GRAFT ANGIOGRAM N/A 07/19/2013   Procedure: LEFT HEART CATHETERIZATION WITH Beatrix Fetters;   Surgeon: Leonie Man, MD;  Location[facility];  Service: Cardiovascular;  Laterality: N/A;  . MASS EXCISION Right 06/25/2014   Procedure: EXCISION BUTTOCK MASS ;  Surgeon: Coralie Keens, MD;  Location: [place];  Service: General;  Laterality: Right;  . ORIF HUMERUS FRACTURE Left 08/27/2013   Procedure: OPEN REDUCTION INTERNAL FIXATION (ORIF) LEFT HUMERUS ;  Surgeon: Rozanna Box, MD;  Location: [place];  Service: Orthopedics;  Laterality: Left;  . POSTERIOR FUSION CERVICAL SPINE  1992  . THROMBECTOMY / EMBOLECTOMY FEMORAL ARTERY Right    hx/notes 03/20/2000  . TOTAL HIP ARTHROPLASTY Right 07/19/2016   Procedure: TOTAL HIP ARTHROPLASTY ANTERIOR APPROACH;  Surgeon: [name], MD;  Location: [place];  Service: Orthopedics;  Laterality: Right;  . TOTAL HIP  REVISION Right 08/22/2017   Procedure: TOTAL HIP REVISION;  Surgeon: [name], MD;  Location: [facility];  Service: Orthopedics;  Laterality: Right;     Social History   reports that she quit smoking about 20 years ago. Her smoking use included cigarettes. She has a 17.00 pack-year smoking history. She has never used smokeless tobacco. She reports previous alcohol use. She reports current drug use. Drug: Marijuana.   Family History   Her family history includes Clotting disorder in her father; Heart disease in her brother and brother; Lung cancer in her mother and sister. There is no history of Colon cancer or Breast cancer.   Allergies Allergies  Allergen Reactions  . Benadryl [Diphenhydramine] Shortness Of Breath  . Penicillins Diarrhea    Did it involve swelling of the face/tongue/throat, SOB, or low BP? No Did it involve sudden or severe rash/hives, skin peeling, or any reaction on the inside of your mouth or nose? No Did you need to seek medical attention at a hospital or doctor's office? No When did it last happen?2 months If all above answers are "NO", may proceed with cephalosporin use.  .  Adhesive [Tape] Other (See Comments)    Burning of skin  . Amoxicillin Diarrhea and Other (See Comments)    Has patient had a PCN reaction causing immediate rash, facial/tongue/throat swelling, SOB or lightheadedness with hypotension: No Has patient had a PCN reaction causing severe rash involving mucus membranes or skin necrosis: No Has patient had a PCN reaction that required hospitalization No Has patient had a PCN reaction occurring within the last 10 years: Yes -- noted rxn as diarrhea If all of the above answers are "NO", then may proceed with Cephalosporin use.   . Cyclobenzaprine Other (See Comments)    Causes restless leg syndrome Restless syndrome  . Hydrocodone Itching     Home Medications  Prior to Admission medications   Medication Sig Start Date End Date Taking? Authorizing Provider  acetaminophen (TYLENOL) 500 MG tablet Take 500-1,000 mg by mouth every 6 (six) hours as needed (for pain.).   Yes [provider]  albuterol (PROVENTIL HFA;VENTOLIN HFA) 108 (90 Base) MCG/ACT inhaler Inhale 2 puffs into the lungs every 6 (six) hours as needed for wheezing or shortness of breath. 01/01/18  Yes Bhagat, Bhavinkumar, PA  ALPRAZolam (XANAX) 0.5 MG tablet Take 0.5 mg by mouth 3 (three) times daily as needed for anxiety.   Yes [provider]  apixaban (ELIQUIS) 5 MG TABS tablet Take 1 tablet (5 mg total) by mouth 2 (two) times daily. 11/20/18  Yes Nahser, Wonda Cheng, MD  atorvastatin (LIPITOR) 80 MG tablet TAKE ONE TABLET BY MOUTH DAILY AT 6PM Patient taking differently: Take 80 mg by mouth daily at 6 PM. TAKE ONE TABLET BY MOUTH DAILY AT 6PM 11/20/18  Yes Nahser, Wonda Cheng, MD  buPROPion (WELLBUTRIN XL) 150 MG 24 hr tablet Take 150 mg by mouth daily.   Yes [provider]  ciprofloxacin (CIPRO) 500 MG tablet Take 1 tablet (500 mg total) by mouth 2 (two) times daily. 06/14/19  Yes Waynetta Sandy, MD  clopidogrel (PLAVIX) 75 MG tablet Take 75 mg by mouth  daily.   Yes [provider]  diclofenac Sodium (VOLTAREN) 1 % GEL Apply 1 g topically 4 (four) times daily as needed for pain. 02/15/19  Yes [provider]  ezetimibe (ZETIA) 10 MG tablet Take 10 mg by mouth daily.   Yes [provider]  gabapentin (NEURONTIN)  300 MG capsule Take 300 mg by mouth 3 (three) times daily as needed for pain. 02/15/19  Yes [provider]  isosorbide mononitrate (IMDUR) 60 MG 24 hr tablet Take 1.5 tablets (90 mg total) by mouth every morning. Patient taking differently: Take 60 mg by mouth daily.  02/15/19  Yes Nahser, Wonda Cheng, MD  metoprolol tartrate (LOPRESSOR) 50 MG tablet Take 1 tablet (50 mg total) by mouth 2 (two) times daily. 11/20/18  Yes Nahser, Wonda Cheng, MD  nitroGLYCERIN (NITROSTAT) 0.4 MG SL tablet Place 1 tablet (0.4 mg total) under the tongue every 5 (five) minutes as needed for chest pain. 11/20/18  Yes Nahser, Wonda Cheng, MD  Oxycodone HCl 10 MG TABS Take 10 mg by mouth 3 (three) times daily. 04/11/19  Yes [provider]  pantoprazole (PROTONIX) 40 MG tablet Take 1 tablet (40 mg total) by mouth daily. 11/20/18  Yes Nahser, Wonda Cheng, MD  pramipexole (MIRAPEX) 0.25 MG tablet Take 0.25 mg by mouth at bedtime.  02/18/19  Yes [provider]  ranolazine (RANEXA) 1000 MG SR tablet Take 1,000 mg by mouth 2 (two) times daily.   Yes [provider]  sertraline (ZOLOFT) 100 MG tablet Take 150 mg by mouth at bedtime.    Yes [provider]  valsartan-hydrochlorothiazide (DIOVAN-HCT) 160-12.5 MG tablet Take 1 tablet by mouth daily.  03/14/19  Yes [provider]  furosemide (LASIX) 20 MG tablet Take 1 tablet (20 mg total) by mouth daily as needed. Patient taking differently: Take 20 mg by mouth daily as needed for fluid.  11/20/18 11/20/19  Nahser, Wonda Cheng, MD    Dr. Jose Persia Internal Medicine PGY-1  Pager: [phone] 06/24/2019, 3:33 PM  PCCM attending:  This is a 69 year old female  past medical history of CAD history of CABG, history of severe peripheral arterial disease recently underwent aorto femoral bypass.  Patient was on the floor today developed acute episode of diaphoresis and tachycardia.  Patient was found to be hypotensive with severe lactic acidosis cold cool, mottled and cyanotic extremities.  Patient presented to the intensive care unit respiratory failure and was intubated.  Bedside echocardiography revealed reduced ejection fraction with poor ventricular motion.  Concern for cardiogenic shock.  Patient was placed on vasopressors.  Unfortunately patient suffered cardiac arrest with team at bedside for resuscitation.  ROSC was obtained.  Cardiology was at bedside.  Decision was made for potential LVAD insertion and offloading of the left ventricle with Impella.  Cardiothoracic surgery was consulted urgently.  BP 91/60   Pulse (!) 113   Temp 97.6 F (36.4 C) (Oral)   Resp (!) 28   Wt 75.9 kg   SpO2 92%   BMI 31.61 kg/m   General: Elderly chronically ill-appearing female intubated on mechanical life support mottled Heart: Diminished heart tones, S1-S2 Lungs: Crackles in the bilateral bases, no wheeze Abdomen: Midline incision from bypass surgery, incisions are all clean dry and intact.  Labs: Reviewed EKG reviewed  Assessment: Cardiogenic shock Acute systolic heart failure, reduced left ventricular ejection fraction Cardiac arrest Acute hypoxemic respiratory failure requiring intubation mechanical ventilation secondary to above History of aortobifemoral bypass  Plan: Discussed with cardiology and cardiothoracic surgery, decision made for trip to OR urgently for Impella placement. Continue vasopressors to maintain mean arterial pressure greater than 65 Wean off norepinephrine first, continue epinephrine Continue to monitor urine output closely Once out of the OR we will continue to manage here in the intensive care unit. We appreciate coordinated  efforts from all specialty services.  This patient is critically ill with multiple organ system failure; which, requires frequent high complexity decision making, assessment, support, evaluation, and titration of therapies. This was completed through the application of advanced monitoring technologies and extensive interpretation of multiple databases. During this encounter critical care time was devoted to patient care services described in this note for 48 minutes.  [facility] 06/24/2019 6:37 PM

## 2019-06-24 NOTE — Progress Notes (Signed)
Per CCMD, patient's QTC is 500-508. Patient asymptomatic lying in bed. PA notified via [facility] system.

## 2019-06-25 ENCOUNTER — Other Ambulatory Visit: Payer: Self-pay

## 2019-06-25 ENCOUNTER — Inpatient Hospital Stay (HOSPITAL_COMMUNITY): Payer: Medicare HMO

## 2019-06-25 DIAGNOSIS — I5032 Chronic diastolic (congestive) heart failure: Secondary | ICD-10-CM

## 2019-06-25 DIAGNOSIS — I7409 Other arterial embolism and thrombosis of abdominal aorta: Secondary | ICD-10-CM | POA: Diagnosis not present

## 2019-06-25 DIAGNOSIS — R570 Cardiogenic shock: Secondary | ICD-10-CM | POA: Diagnosis not present

## 2019-06-25 LAB — COOXEMETRY PANEL
Carboxyhemoglobin: 1.4 % (ref 0.5–1.5)
Methemoglobin: 1 % (ref 0.0–1.5)
O2 Saturation: 75.8 %
Total hemoglobin: 10.2 g/dL — ABNORMAL LOW (ref 12.0–16.0)

## 2019-06-25 LAB — CBC
HCT: 30.6 % — ABNORMAL LOW (ref 36.0–46.0)
Hemoglobin: 10 g/dL — ABNORMAL LOW (ref 12.0–15.0)
MCH: 29.8 pg (ref 26.0–34.0)
MCHC: 32.7 g/dL (ref 30.0–36.0)
MCV: 91.1 fL (ref 80.0–100.0)
Platelets: 147 10*3/uL — ABNORMAL LOW (ref 150–400)
RBC: 3.36 MIL/uL — ABNORMAL LOW (ref 3.87–5.11)
RDW: 15.7 % — ABNORMAL HIGH (ref 11.5–15.5)
WBC: 9 10*3/uL (ref 4.0–10.5)
nRBC: 0.4 % — ABNORMAL HIGH (ref 0.0–0.2)

## 2019-06-25 LAB — COMPREHENSIVE METABOLIC PANEL
ALT: 40 U/L (ref 0–44)
AST: 79 U/L — ABNORMAL HIGH (ref 15–41)
Albumin: 2.2 g/dL — ABNORMAL LOW (ref 3.5–5.0)
Alkaline Phosphatase: 74 U/L (ref 38–126)
Anion gap: 8 (ref 5–15)
BUN: 16 mg/dL (ref 8–23)
CO2: 24 mmol/L (ref 22–32)
Calcium: 8.1 mg/dL — ABNORMAL LOW (ref 8.9–10.3)
Chloride: 116 mmol/L — ABNORMAL HIGH (ref 98–111)
Creatinine, Ser: 1.22 mg/dL — ABNORMAL HIGH (ref 0.44–1.00)
GFR calc Af Amer: 53 mL/min — ABNORMAL LOW (ref >60)
GFR calc non Af Amer: 46 mL/min — ABNORMAL LOW (ref >60)
Glucose, Bld: 123 mg/dL — ABNORMAL HIGH (ref 70–99)
Potassium: 3.1 mmol/L — ABNORMAL LOW (ref 3.5–5.1)
Sodium: 148 mmol/L — ABNORMAL HIGH (ref 135–145)
Total Bilirubin: 1 mg/dL (ref 0.3–1.2)
Total Protein: 4.2 g/dL — ABNORMAL LOW (ref 6.5–8.1)

## 2019-06-25 LAB — HEPARIN LEVEL (UNFRACTIONATED)
Heparin Unfractionated: <0.1 IU/mL — ABNORMAL LOW (ref 0.30–0.70)
Heparin Unfractionated: <0.1 IU/mL — ABNORMAL LOW (ref 0.30–0.70)
Heparin Unfractionated: 0.1 IU/mL — ABNORMAL LOW (ref 0.30–0.70)

## 2019-06-25 LAB — TROPONIN I (HIGH SENSITIVITY)
Troponin I (High Sensitivity): 2957 ng/L (ref <18)
Troponin I (High Sensitivity): 2749 ng/L (ref <18)
Troponin I (High Sensitivity): 2533 ng/L (ref <18)

## 2019-06-25 LAB — POCT I-STAT 7, (LYTES, BLD GAS, ICA,H+H)
Acid-Base Excess: 1 mmol/L (ref 0.0–2.0)
Bicarbonate: 24.4 mmol/L (ref 20.0–28.0)
Calcium, Ion: 1.23 mmol/L (ref 1.15–1.40)
HCT: 27 % — ABNORMAL LOW (ref 36.0–46.0)
Hemoglobin: 9.2 g/dL — ABNORMAL LOW (ref 12.0–15.0)
O2 Saturation: 100 %
Patient temperature: 37.2
Potassium: 3 mmol/L — ABNORMAL LOW (ref 3.5–5.1)
Sodium: 148 mmol/L — ABNORMAL HIGH (ref 135–145)
TCO2: 25 mmol/L (ref 22–32)
pCO2 arterial: 34.3 mmHg (ref 32.0–48.0)
pH, Arterial: 7.461 — ABNORMAL HIGH (ref 7.350–7.450)
pO2, Arterial: 180 mmHg — ABNORMAL HIGH (ref 83.0–108.0)

## 2019-06-25 LAB — GLUCOSE, CAPILLARY
Glucose-Capillary: 103 mg/dL — ABNORMAL HIGH (ref 70–99)
Glucose-Capillary: 114 mg/dL — ABNORMAL HIGH (ref 70–99)
Glucose-Capillary: 118 mg/dL — ABNORMAL HIGH (ref 70–99)
Glucose-Capillary: 124 mg/dL — ABNORMAL HIGH (ref 70–99)
Glucose-Capillary: 136 mg/dL — ABNORMAL HIGH (ref 70–99)
Glucose-Capillary: 178 mg/dL — ABNORMAL HIGH (ref 70–99)

## 2019-06-25 LAB — LACTIC ACID, PLASMA
Lactic Acid, Venous: 3.3 mmol/L (ref 0.5–1.9)
Lactic Acid, Venous: 1.8 mmol/L (ref 0.5–1.9)

## 2019-06-25 LAB — MAGNESIUM
Magnesium: 1.8 mg/dL (ref 1.7–2.4)
Magnesium: 1.7 mg/dL (ref 1.7–2.4)

## 2019-06-25 LAB — ECHOCARDIOGRAM LIMITED: Weight: 2814.83 oz

## 2019-06-25 LAB — LACTATE DEHYDROGENASE: LDH: 395 U/L — ABNORMAL HIGH (ref 98–192)

## 2019-06-25 MED ORDER — MAGNESIUM SULFATE 2 GM/50ML IV SOLN: 2.0000 g | Freq: Once | INTRAVENOUS | Status: DC

## 2019-06-25 MED ORDER — ORAL CARE MOUTH RINSE
15.0000 mL | Freq: Two times a day (BID) | OROMUCOSAL | Status: DC
  Administered 2019-06-26 – 2019-06-28 (×5): 15 mL via OROMUCOSAL

## 2019-06-25 MED ORDER — ATORVASTATIN CALCIUM 80 MG PO TABS
80.0000 mg | ORAL_TABLET | Freq: Every day | ORAL | Status: DC
  Administered 2019-06-25: 80 mg
  Filled 2019-06-25: qty 1

## 2019-06-25 MED ORDER — ATORVASTATIN CALCIUM 80 MG PO TABS
80.0000 mg | ORAL_TABLET | Freq: Every day | ORAL | Status: DC
  Administered 2019-06-26 – 2019-07-08 (×13): 80 mg via ORAL
  Filled 2019-06-25 (×14): qty 1

## 2019-06-25 MED ORDER — NICARDIPINE HCL IN NACL 20-0.86 MG/200ML-% IV SOLN
3.0000 mg/h | INTRAVENOUS | Status: DC
  Administered 2019-06-25 – 2019-06-26 (×5): 5 mg/h via INTRAVENOUS
  Filled 2019-06-25 (×8): qty 200

## 2019-06-25 MED ORDER — LACTATED RINGERS IV SOLN: INTRAVENOUS | Status: DC

## 2019-06-25 MED ORDER — MAGNESIUM SULFATE 2 GM/50ML IV SOLN
2.0000 g | Freq: Once | INTRAVENOUS | Status: AC
  Administered 2019-06-25: 2 g via INTRAVENOUS
  Filled 2019-06-25: qty 50

## 2019-06-25 MED ORDER — EZETIMIBE 10 MG PO TABS
10.0000 mg | ORAL_TABLET | Freq: Every day | ORAL | Status: DC
  Administered 2019-06-25: 10 mg
  Filled 2019-06-25: qty 1

## 2019-06-25 MED ORDER — ASPIRIN 81 MG PO CHEW: 81.0000 mg | CHEWABLE_TABLET | Freq: Every day | ORAL | Status: DC

## 2019-06-25 MED ORDER — CLOPIDOGREL BISULFATE 75 MG PO TABS
75.0000 mg | ORAL_TABLET | Freq: Every day | ORAL | Status: DC
  Administered 2019-06-26 – 2019-06-30 (×5): 75 mg via ORAL
  Filled 2019-06-25 (×5): qty 1

## 2019-06-25 MED ORDER — DOCUSATE SODIUM 50 MG/5ML PO LIQD: 100.0000 mg | Freq: Two times a day (BID) | ORAL | Status: DC

## 2019-06-25 MED ORDER — POLYETHYLENE GLYCOL 3350 17 G PO PACK: 17.0000 g | PACK | Freq: Every day | ORAL | Status: DC

## 2019-06-25 MED ORDER — DOCUSATE SODIUM 100 MG PO CAPS: 100.0000 mg | ORAL_CAPSULE | Freq: Every day | ORAL | Status: DC

## 2019-06-25 MED ORDER — HEPARIN (PORCINE) 25000 UT/250ML-% IV SOLN
950.0000 [IU]/h | INTRAVENOUS | Status: DC
  Administered 2019-06-26: 800 [IU]/h via INTRAVENOUS
  Filled 2019-06-25: qty 250

## 2019-06-25 MED ORDER — SPIRONOLACTONE 12.5 MG HALF TABLET
12.5000 mg | ORAL_TABLET | Freq: Every day | ORAL | Status: DC
  Administered 2019-06-25 – 2019-06-26 (×2): 12.5 mg via ORAL
  Filled 2019-06-25 (×2): qty 1

## 2019-06-25 MED ORDER — POTASSIUM CHLORIDE 10 MEQ/50ML IV SOLN
10.0000 meq | INTRAVENOUS | Status: AC
  Administered 2019-06-25 (×4): 10 meq via INTRAVENOUS
  Filled 2019-06-25 (×4): qty 50

## 2019-06-25 MED ORDER — POTASSIUM CHLORIDE 20 MEQ PO PACK: 40.0000 meq | PACK | Freq: Four times a day (QID) | ORAL | Status: DC

## 2019-06-25 MED ORDER — EZETIMIBE 10 MG PO TABS
10.0000 mg | ORAL_TABLET | Freq: Every day | ORAL | Status: DC
  Administered 2019-06-26 – 2019-07-12 (×17): 10 mg via ORAL
  Filled 2019-06-25 (×17): qty 1

## 2019-06-25 MED ORDER — PANTOPRAZOLE SODIUM 40 MG PO PACK
40.0000 mg | PACK | Freq: Every day | ORAL | Status: DC
  Administered 2019-06-25: 40 mg
  Filled 2019-06-25 (×2): qty 20

## 2019-06-25 MED ORDER — ASPIRIN 81 MG PO CHEW
81.0000 mg | CHEWABLE_TABLET | Freq: Every day | ORAL | Status: DC
  Administered 2019-06-26 – 2019-07-12 (×17): 81 mg via ORAL
  Filled 2019-06-25 (×17): qty 1

## 2019-06-25 MED ORDER — FENTANYL CITRATE (PF) 100 MCG/2ML IJ SOLN
25.0000 ug | INTRAMUSCULAR | Status: DC | PRN
  Administered 2019-06-25 – 2019-06-28 (×9): 50 ug via INTRAVENOUS
  Administered 2019-06-29: 25 ug via INTRAVENOUS
  Administered 2019-06-29: 50 ug via INTRAVENOUS
  Administered 2019-06-29: 25 ug via INTRAVENOUS
  Administered 2019-06-29 – 2019-06-30 (×2): 50 ug via INTRAVENOUS
  Administered 2019-06-30: 25 ug via INTRAVENOUS
  Administered 2019-06-30 – 2019-07-01 (×3): 50 ug via INTRAVENOUS
  Administered 2019-07-01 (×2): 25 ug via INTRAVENOUS
  Administered 2019-07-01 – 2019-07-06 (×12): 50 ug via INTRAVENOUS
  Administered 2019-07-07: 02:00:00 25 ug via INTRAVENOUS
  Filled 2019-06-25 (×32): qty 2

## 2019-06-25 MED ORDER — ASPIRIN 81 MG PO CHEW
81.0000 mg | CHEWABLE_TABLET | Freq: Every day | ORAL | Status: DC
  Administered 2019-06-25: 81 mg
  Filled 2019-06-25: qty 1

## 2019-06-25 MED ORDER — DIGOXIN 125 MCG PO TABS
0.1250 mg | ORAL_TABLET | Freq: Every day | ORAL | Status: DC
  Administered 2019-06-25 – 2019-07-08 (×14): 0.125 mg via ORAL
  Filled 2019-06-25 (×14): qty 1

## 2019-06-25 MED ORDER — FUROSEMIDE 10 MG/ML IJ SOLN
40.0000 mg | Freq: Once | INTRAMUSCULAR | Status: AC
  Administered 2019-06-25: 40 mg via INTRAVENOUS
  Filled 2019-06-25: qty 4

## 2019-06-25 MED ORDER — HEPARIN (PORCINE) 25000 UT/250ML-% IV SOLN
350.0000 [IU]/h | INTRAVENOUS | Status: DC
  Administered 2019-06-25: 350 [IU]/h via INTRAVENOUS
  Filled 2019-06-25: qty 250

## 2019-06-25 MED ORDER — CLOPIDOGREL BISULFATE 75 MG PO TABS
75.0000 mg | ORAL_TABLET | Freq: Every day | ORAL | Status: DC
  Administered 2019-06-25: 75 mg
  Filled 2019-06-25: qty 1

## 2019-06-25 NOTE — Progress Notes (Addendum)
Patient ID: Wendy [name], female   DOB: [birth year], 69 y.o.   MRN: [id number]     Advanced Heart Failure Rounding Note  PCP-Cardiologist: [name], MD   Subjective:    - 4/13 aortobifemoral bypass - Impella 5.5 placement 4/19 for cardiogenic shock.   She is awake on vent this morning.  Follows commands.  She is back in atrial fibrillation with controlled rate in 90s.  She is off pressors, on NTG gtt at 15 mcg/min, heparin gtt, amiodarone gtt 30 mg/hr. Lactate down to 1.8.   Swan numbers: CVP 15 PA 49/26 CI 2.1 Co-ox 63% (last night)  Impella: P7, flow 3.9 L/min.  No alarms.   Objective:   Weight Range: 79.8 kg Body mass index is 33.24 kg/m.   Vital Signs:   Temp:  [93.9 F (34.4 C)-99.1 F (37.3 C)] 98.8 F (37.1 C) (04/20 0700) Pulse Rate:  [26-130] 93 (04/20 0700) Resp:  [0-38] 23 (04/20 0700) BP: (32-150)/(24-118) 131/80 (04/20 0700) SpO2:  [56 %-100 %] 100 % (04/20 0700) Arterial Line BP: (101-155)/(68-90) 110/72 (04/19 2330) FiO2 (%):  [50 %-100 %] 50 % (04/20 0423) Weight:  [79.8 kg] 79.8 kg (04/20 0430) Last BM Date: 06/24/19  Weight change: Filed Weights   06/23/19 0442 06/24/19 0529 06/25/19 0430  Weight: 79.4 kg 75.9 kg 79.8 kg    Intake/Output:   Intake/Output Summary (Last 24 hours) at 06/25/2019 0721 Last data filed at 06/25/2019 0700 Gross per 24 hour  Intake 2813.08 ml  Output 1335 ml  Net 1478.08 ml      Physical Exam    General:  Well appearing. No resp difficulty HEENT: Normal Neck: Supple. JVP 12. Carotids 2+ bilat; no bruits. No lymphadenopathy or thyromegaly appreciated. Cor: PMI nondisplaced. Irregular rate & rhythm. No rubs, gallops or murmurs. Lungs: Decreased at bases.  Abdomen: Soft, nontender, nondistended. No hepatosplenomegaly. No bruits or masses. Good bowel sounds. Extremities: No cyanosis, clubbing, rash. 1+ ankle edema.  Unable to palpate left radial pulse.  Neuro: Alert & orientedx3, cranial nerves grossly  intact. moves all 4 extremities w/o difficulty. Affect pleasant   Telemetry   Atrial fibrillation in 90s (personally reviewed)  Labs    CBC Recent Labs    06/24/19 1458 06/24/19 1459 06/25/19 0423 06/25/19 0442  WBC 11.9*  --   --  9.0  NEUTROABS 7.9*  --   --   --   HGB 10.3*   < > 9.2* 10.0*  HCT 34.1*   < > 27.0* 30.6*  MCV 96.9  --   --  91.1  PLT 301  --   --  147*   < > = values in this interval not displayed.   Basic Metabolic Panel Recent Labs    06/23/19 0448 06/23/19 0448 06/24/19 0320 06/24/19 1458 06/24/19 2049 06/24/19 2223 06/24/19 2315 06/25/19 0423 06/25/19 0442  NA 144   < > 146*   < > 147*   < >  --  148* 148*  K 4.1   < > 4.0   < > 3.0*   < >  --  3.0* 3.1*  CL 117*   < > 114*   < > 114*  --   --   --  116*  CO2 19*   < > 20*   < > 20*  --   --   --  24  GLUCOSE 98   < > 113*   < > 248*  --   --   --  123*  BUN 19   < > 15   < > 15  --   --   --  16  CREATININE 1.77*   < > 1.26*   < > 1.13*  --   --   --  1.22*  CALCIUM 8.3*   < > 8.5*   < > 8.2*  --   --   --  8.1*  MG  --   --   --   --   --   --  1.8  --  1.7  PHOS 3.8  --  3.0  --   --   --   --   --   --    < > = values in this interval not displayed.   Liver Function Tests Recent Labs    06/24/19 1458 06/25/19 0442  AST 52* 79*  ALT 20 40  ALKPHOS 99 74  BILITOT 0.8 1.0  PROT 4.7* 4.2*  ALBUMIN 1.8* 2.2*   No results for input(s): LIPASE, AMYLASE in the last 72 hours. Cardiac Enzymes Recent Labs    06/24/19 1458  CKTOTAL 86  CKMB 11.2*    BNP: BNP (last 3 results) Recent Labs    06/19/19 1239  BNP 366.7*    ProBNP (last 3 results) No results for input(s): PROBNP in the last 8760 hours.   D-Dimer No results for input(s): DDIMER in the last 72 hours. Hemoglobin A1C Recent Labs    06/24/19 2318  HGBA1C 5.4   Fasting Lipid Panel No results for input(s): CHOL, HDL, LDLCALC, TRIG, CHOLHDL, LDLDIRECT in the last 72 hours. Thyroid Function Tests No results  for input(s): TSH, T4TOTAL, T3FREE, THYROIDAB in the last 72 hours.  Invalid input(s): FREET3  Other results:   Imaging    DG CHEST PORT 1 VIEW  Result Date: 06/24/2019 CLINICAL DATA:  Respiratory failure EXAM: PORTABLE CHEST 1 VIEW COMPARISON:  06/24/2019, 06/19/2019 FINDINGS: Endotracheal tube tip partially obscured by overlying tubes and lines, the tip appears to be just above the carina. Esophageal tube side-port at the distal esophagus. New left IJ Swan-Ganz catheter with tip overlying the right pulmonary artery. Placement of Impella device with tip over the left cardiac base. Post sternotomy changes. Probable vascular stent over the right cardiac silhouette. Stable cardiomediastinal silhouette with vascular congestion and diffuse interstitial and hazy pulmonary opacity, probably pulmonary edema. Superimposed more confluent disease at the bases and right upper lobe. No pneumothorax. IMPRESSION: 1. Endotracheal tube tip difficult to see due to overlying support devices, tip is probably just above the carina. 2. Esophageal tube side-port visible at the distal esophagus, consider further advancement for more optimal positioning 3. New left IJ Swan-Ganz catheter with tip projecting over right pulmonary artery. No pneumothorax. 4. Worsening aeration of the left base. Continued diffuse hazy and interstitial lung opacity, probably pulmonary edema. Electronically Signed   By: Donavan Foil M.D.   On: 06/24/2019 21:21   DG CHEST PORT 1 VIEW  Result Date: 06/24/2019 CLINICAL DATA:  Respiratory failure, intubation EXAM: PORTABLE CHEST 1 VIEW COMPARISON:  06/19/2019 FINDINGS: Endotracheal tube terminates 2 cm above the carina. Increased interstitial markings/hazy pulmonary opacities bilaterally, suggesting mild multifocal pneumonia or aspiration, right upper lobe predominant. No definite pleural effusions. The heart is normal in size. Postsurgical changes related to prior CABG. Coronary stent. Defibrillator  pads overlying the left hemithorax. Median sternotomy. Status post ORIF of the left proximal humerus. IMPRESSION: Endotracheal tube terminates 2 cm above the carina. Multifocal pulmonary opacities, suggesting  mild multifocal pneumonia or aspiration, right upper lobe predominant. Electronically Signed   By: Julian Hy M.D.   On: 06/24/2019 16:17   DG Abd Portable 1V  Result Date: 06/25/2019 CLINICAL DATA:  OG tube placement EXAM: PORTABLE ABDOMEN - 1 VIEW COMPARISON:  06/18/2019 FINDINGS: OG tube is in the stomach. Prior cholecystectomy. Nonobstructive bowel gas pattern. IMPRESSION: OG tube tip in the stomach. Electronically Signed   By: Rolm Baptise M.D.   On: 06/25/2019 01:00   DG C-Arm 1-60 Min-No Report  Result Date: 06/24/2019 Fluoroscopy was utilized by the requesting physician.  No radiographic interpretation.   ECHOCARDIOGRAM COMPLETE  Result Date: 06/24/2019    ECHOCARDIOGRAM REPORT   Patient Name:   Wendy [name] Date of Exam: 06/24/2019 Medical Rec #:  [id number]        Height:       61.0 in Accession #:    [id number]       Weight:       167.3 lb Date of Birth:  11-Jul-1950        BSA:          1.751 m Patient Age:    53 years         BP:           70/30 mmHg Patient Gender: F                HR:           130 bpm. Exam Location:  Inpatient Procedure: 2D Echo STAT ECHO Indications:    Hypoxia [211941  History:        Patient has prior history of Echocardiogram examinations, most                 recent 06/17/2019. Previous Myocardial Infarction and CAD, Prior                 CABG, COPD, Signs/Symptoms:Chest Pain; Risk                 Factors:Dyslipidemia, Hypertension and Former Smoker. GERD.  Sonographer:    Leavy Cella Referring Phys: [id number] [place]  Sonographer Comments: Aundra Dubin at bedside IMPRESSIONS  1. Left ventricular ejection fraction, by estimation, is 20 to 25%. The left ventricle has severely decreased function. The left ventricle demonstrates global  hypokinesis. Left ventricular diastolic parameters are consistent with Grade III diastolic dysfunction (restrictive).  2. Right ventricular systolic function is normal. The right ventricular size is normal.  3. The mitral valve is normal in structure. Mild mitral valve regurgitation. No evidence of mitral stenosis.  4. The aortic valve is normal in structure. Aortic valve regurgitation is not visualized. No aortic stenosis is present.  5. The inferior vena cava is normal in size with greater than 50% respiratory variability, suggesting right atrial pressure of 3 mmHg. Comparison(s): Prior images reviewed side by side. The left ventricular function is significantly worse. FINDINGS  Left Ventricle: There is mild basilar sparing of myocardial contractility. Left ventricular ejection fraction, by estimation, is 20 to 25%. The left ventricle has severely decreased function. The left ventricle demonstrates global hypokinesis. The left ventricular internal cavity size was normal in size. There is no left ventricular hypertrophy. Left ventricular diastolic parameters are consistent with Grade III diastolic dysfunction (restrictive). Right Ventricle: The right ventricular size is normal. No increase in right ventricular wall thickness. Right ventricular systolic function is normal. Left Atrium: Left atrial size was normal in size. Right Atrium: Right atrial size was  normal in size. Pericardium: There is no evidence of pericardial effusion. Mitral Valve: The mitral valve is normal in structure. There is mild thickening of the mitral valve leaflet(s). Normal mobility of the mitral valve leaflets. Mild mitral valve regurgitation, with posteriorly-directed jet. No evidence of mitral valve stenosis. Tricuspid Valve: The tricuspid valve is normal in structure. Tricuspid valve regurgitation is not demonstrated. No evidence of tricuspid stenosis. Aortic Valve: The aortic valve is normal in structure. Aortic valve regurgitation is  not visualized. No aortic stenosis is present. Pulmonic Valve: The pulmonic valve was normal in structure. Pulmonic valve regurgitation is not visualized. No evidence of pulmonic stenosis. Aorta: The aortic root is normal in size and structure. Venous: The inferior vena cava is normal in size with greater than 50% respiratory variability, suggesting right atrial pressure of 3 mmHg. IAS/Shunts: No atrial level shunt detected by color flow Doppler. Additional Comments: Mild ascites is present.   Diastology LV e' lateral:   10.10 cm/s LV E/e' lateral: 10.5 LV e' medial:    4.24 cm/s LV E/e' medial:  25.0  RIGHT VENTRICLE RV S prime:     8.16 cm/s TAPSE (M-mode): 1.1 cm LEFT ATRIUM             Index       RIGHT ATRIUM          Index LA Vol (A2C):   43.2 ml 24.68 ml/m RA Area:     8.74 cm LA Vol (A4C):   32.5 ml 18.56 ml/m RA Volume:   16.40 ml 9.37 ml/m LA Biplane Vol: 39.2 ml 22.39 ml/m  MITRAL VALVE MV Area (PHT): 4.31 cm MV Decel Time: 176 msec MV E velocity: 106.00 cm/s MV A velocity: 28.10 cm/s MV E/A ratio:  3.77 Candee Furbish MD Electronically signed by Candee Furbish MD Signature Date/Time: 06/24/2019/4:12:06 PM    Final       Medications:     Scheduled Medications: . sodium chloride   Intravenous Once  . aspirin  81 mg Oral Daily  . atorvastatin  80 mg Oral Daily  . chlorhexidine gluconate (MEDLINE KIT)  15 mL Mouth Rinse BID  . Chlorhexidine Gluconate Cloth  6 each Topical Daily  . clopidogrel  75 mg Oral Daily  . docusate  100 mg Oral BID  . ezetimibe  10 mg Oral Daily  . furosemide  40 mg Intravenous Once  . insulin aspart  0-9 Units Subcutaneous Q4H  . LORazepam  0.5 mg Intravenous Once  . mouth rinse  15 mL Mouth Rinse 10 times per day  . pantoprazole  40 mg Oral Daily  . polyethylene glycol  17 g Oral Daily  . sodium chloride flush  10-40 mL Intracatheter Q12H     Infusions: . sodium chloride Stopped (06/25/19 0618)  . amiodarone 30 mg/hr (06/25/19 0700)  . epinephrine  Stopped (06/24/19 1719)  . famotidine (PEPCID) IV    . fentaNYL infusion INTRAVENOUS 150 mcg/hr (06/25/19 0700)  . impella catheter heparin 50 unit/mL in dextrose 5%    . heparin    . nitroGLYCERIN 15 mcg/min (06/25/19 0700)  . potassium chloride 50 mL/hr at 06/25/19 0700     PRN Medications:  sodium chloride, acetaminophen **OR** acetaminophen, ALPRAZolam, alum & mag hydroxide-simeth, diphenhydrAMINE **OR** diphenhydrAMINE, fentaNYL, fentaNYL (SUBLIMAZE) injection, gabapentin, guaiFENesin-dextromethorphan, metoprolol tartrate, ondansetron, phenol, potassium chloride, sodium chloride flush   Assessment/Plan   1. Cardiogenic shock: Progressive worsening 4/19 culminating in PEA arrest.  Lactate 8.1.  She had episodes of hypotension  and atrial fibrillation/RVR earlier in her post-op course as well.  Symptoms were dyspnea and anxiety no chest pain.  No STEMI on ECG but concern for possible coronary event based on history.  Initial troponin 1802 => 2957.  Initially on NE, epi then Impella 5.5 placed on 4/19.  Now MAP is stable and off pressors, on NTG 15 mcg/min.  Lactate has normalized.  2. Acute systolic CHF: Suspect ischemic cardiomyopathy.  Echo today with EF 15-20%, diffuse hypokinesis, preserved RV function.  Concern for coronary event based on her prior history.  Pre-op echo with EF 60%.  HS-TnI 1802 => 2957, elevated but not markedly high.  This morning, CI adequate with no Impella alarms. CVP 15 this morning.  - Continue Impella 5.5 at P7 for now, will send co-ox. She remains on heparin gtt for Impella.  - Will give dose Lasix 40 mg IV x 1.   - Keep MAP around 80 on NTG gtt, increase this morning.  Can add hydralazine if needed.  3. AKI: Creatinine as high as 2.86, had started trending down and is 1.22 today.  Follow closely.  4. CAD: LHC in 3/21 showed occluded SVG-RCA and SVG-OM and atretic LIMA-LAD.  The LAD was patent with 60% mid vessel stenosis, the RCA was occluded with collaterals,  and OM1 and OM2 were occluded.  No intervention.  Surprisingly, EF reported as normal pre-op.  No chest pain but had dyspnea and anxiety today.  ECG did not show STEMI, showed fairly stable lateral TWIs.  Still concerned for possible coronary event as trigger for fall in EF and shock.  HS-TnI to 2957.  - Stabilize with Impella - Will need repeat angiography but access is going to be challenging.  Has fresh aortobifemoral bypass and Impella in right subclavian artery, unable to palpate left radial pulse.  Will consult with interventional and vascular colleagues about access => will use left brachial artery, micropuncture, tentatively Thursday.  - Continue statin, ASA, and Plavix.   - Heparin gtt with Impella.  5. PAD: S/p aortobifemoral bypass on 4/13.  Surgical site is stable, she has had palpable PT pulses.  6. Acute hypoxemic respiratory failure: Intubated with PEA arrest. Awake on vent.  - CXR.  - Diuresis as above.  - Extubate if able, per CCM.  7. Atrial fibrillation: Paroxysmal.  She has has been in and out of atrial fibrillation post-op.  Has had episodes of junctional rhythm also.  Currently in atrial fibrillation.  - Continue amiodarone gtt. 8. Thrombocytopenia: Post-Impella placement.  Plts to 147.  Urine clear, send LDH now.   - Will check position under echo.   CRITICAL CARE Performed by: Loralie Champagne  Total critical care time: 45 minutes  Critical care time was exclusive of separately billable procedures and treating other patients.  Critical care was necessary to treat or prevent imminent or life-threatening deterioration.  Critical care was time spent personally by me on the following activities: development of treatment plan with patient and/or surrogate as well as nursing, discussions with consultants, evaluation of patient's response to treatment, examination of patient, obtaining history from patient or surrogate, ordering and performing treatments and interventions,  ordering and review of laboratory studies, ordering and review of radiographic studies, pulse oximetry and re-evaluation of patient's condition.   Length of Stay: 7  Loralie Champagne, MD  06/25/2019, 7:21 AM  Advanced Heart Failure Team Pager [phone] (M-F; 7a - 4p)  Please contact [facility] for night-coverage after hours (4p -7a ) and weekends  on [email or web address]  Position of Impella looks good under echo, about 4.5 cm.  No change.   Loralie Champagne  06/25/2019 8:14 AM

## 2019-06-25 NOTE — Progress Notes (Signed)
1 Day Post-Op Procedure(s) (LRB): PLACEMENT OF IMPELLA LEFT VENTRICULAR ASSIST DEVICE (N/A) Subjective: No complaints  Objective: Vital signs in last 24 hours: Temp:  [93.9 F (34.4 C)-99.1 F (37.3 C)] 98.6 F (37 C) (04/20 0715) Pulse Rate:  [26-130] 89 (04/20 0747) Cardiac Rhythm: Atrial fibrillation (04/19 2100) Resp:  [0-38] 28 (04/20 0747) BP: (32-150)/(24-118) 123/93 (04/20 0715) SpO2:  [56 %-100 %] 100 % (04/20 0747) Arterial Line BP: (101-155)/(68-90) 110/72 (04/19 2330) FiO2 (%):  [40 %-100 %] 40 % (04/20 0747) Weight:  [79.8 kg] 79.8 kg (04/20 0430)  Hemodynamic parameters for last 24 hours: PAP: (37-64)/(20-53) 49/30 CVP:  [8 mmHg-36 mmHg] 14 mmHg CO:  [3.2 L/min-4.2 L/min] 3.7 L/min CI:  [1.9 L/min/m2-2.4 L/min/m2] 2.1 L/min/m2  Intake/Output from previous day: 04/19 0701 - 04/20 0700 In: 2813.1 [P.O.:120; I.V.:1555.3; Blood:315; IV Piggyback:756.7] Out: 1335 [Urine:935; Blood:400] Intake/Output this shift: Total I/O In: 5.9 [Other:5.9] Out: -   General appearance: alert and cooperative Neurologic: intact Heart: regular rate and rhythm, S1, S2 normal, no murmur, click, rub or gallop Lungs: clear to auscultation bilaterally  Lab Results: Recent Labs    06/24/19 1458 06/24/19 1459 06/25/19 0423 06/25/19 0442  WBC 11.9*  --   --  9.0  HGB 10.3*   < > 9.2* 10.0*  HCT 34.1*   < > 27.0* 30.6*  PLT 301  --   --  147*   < > = values in this interval not displayed.   BMET:  Recent Labs    06/24/19 2049 06/24/19 2223 06/25/19 0423 06/25/19 0442  NA 147*   < > 148* 148*  K 3.0*   < > 3.0* 3.1*  CL 114*  --   --  116*  CO2 20*  --   --  24  GLUCOSE 248*  --   --  123*  BUN 15  --   --  16  CREATININE 1.13*  --   --  1.22*  CALCIUM 8.2*  --   --  8.1*   < > = values in this interval not displayed.    PT/INR:  Recent Labs    06/24/19 1458  LABPROT 20.8*  INR 1.8*   ABG    Component Value Date/Time   PHART 7.461 (H) 06/25/2019 0423   HCO3  24.4 06/25/2019 0423   TCO2 25 06/25/2019 0423   ACIDBASEDEF 2.0 06/24/2019 2223   O2SAT 100.0 06/25/2019 0423   CBG (last 3)  Recent Labs    06/24/19 2029 06/25/19 0001 06/25/19 0347  GLUCAP 228* 178* 103*    Assessment/Plan: S/P Procedure(s) (LRB): PLACEMENT OF IMPELLA LEFT VENTRICULAR ASSIST DEVICE (N/A) continue Impella support pending LHC this week   LOS: 7 days    Wonda Olds 06/25/2019

## 2019-06-25 NOTE — Progress Notes (Signed)
Patient placed on CPAP/PSV of 10/5 at 0815 by MD.  Currently tolerating well.  Will continue to monitor.

## 2019-06-25 NOTE — Progress Notes (Addendum)
ANTICOAGULATION CONSULT NOTE - Follow-Up Consult  Pharmacy Consult for Heparin Indication: Afib + new Impella  Patient Measurements: Weight: 75.9 kg (167 lb 4.8 oz)  Ideal body weight: 47.8 kg (105 lb 6.1 oz) Adjusted ideal body weight: 59 kg (130 lb 2.4 oz)  Heparin Dosing Weight: 65 kg  Vital Signs: Temp: 98.6 F (37 C) (04/20 0315) Temp Source: Core (04/19 2000) BP: 102/72 (04/20 0315) Pulse Rate: 30 (04/20 0215)  Labs: Recent Labs    06/22/19 2243 06/22/19 2243 06/23/19 0448 06/23/19 2021 06/24/19 0320 06/24/19 0320 06/24/19 1458 06/24/19 1459 06/24/19 1909 06/24/19 1909 06/24/19 2024 06/24/19 2049 06/24/19 2223 06/24/19 2315 06/25/19 0239  HGB 9.3*   < >  --   --  10.3*   < > 10.3*   < > 8.2*   < > 9.2*  --  9.5*  --   --   HCT 29.8*   < >  --   --  32.9*   < > 34.1*   < > 24.0*  --  27.0*  --  28.0*  --   --   PLT 141*  --   --   --  162  --  301  --   --   --   --   --   --   --   --   LABPROT  --   --   --   --   --   --  20.8*  --   --   --   --   --   --   --   --   INR  --   --   --   --   --   --  1.8*  --   --   --   --   --   --   --   --   HEPARINUNFRC  --   --   --  0.21* 0.26*  --   --   --   --   --   --   --   --   --  <0.10*  CREATININE  --    < >   < >  --  1.26*  --  1.33*  --   --   --   --  1.13*  --   --   --   CKTOTAL  --   --   --   --   --   --  86  --   --   --   --   --   --   --   --   CKMB  --   --   --   --   --   --  11.2*  --   --   --   --   --   --   --   --   TROPONINIHS  --   --   --   --   --   --  1,802*  --   --   --   --   --   --  2,957*  --    < > = values in this interval not displayed.    Estimated Creatinine Clearance: 45 mL/min (A) (by C-G formula based on SCr of 1.13 mg/dL (H)).   Medications:  Infusions:  . sodium chloride 10 mL/hr at 06/25/19 0300  . amiodarone 60 mg/hr (06/25/19 0309)  . amiodarone    . epinephrine Stopped (06/24/19 1719)  .  famotidine (PEPCID) IV    . fentaNYL infusion INTRAVENOUS 150  mcg/hr (06/25/19 0300)  . impella catheter heparin 50 unit/mL in dextrose 5%    . nitroGLYCERIN Stopped (06/24/19 2320)    Assessment: 89 YOF who underwent aortobifemoral bypass on 4/13 and was noted to have post-op Afib with RVR and was initiated on Heparin IV. On 4/19 the patient went into respiratory distress requiring intubation, followed by a cardiac arrest. ECHO at the time showed a low EF of 10-15% and the patient was taken emergently to the OR for Impella placement. The patient is now s/p OR and pharmacy is monitor for anticoagulation with Heparin given the Impella 5.5 + Afib.  Discussed with Dr. Orvan Seen who still wishes to wait 6 hours post-op before starting systemic heparin. The patient currently has heparin running in the purge solution at 5 ml/hr (250 units/hr). Will obtain a 6h post-op HL and if <0.3 - will plan to initiate systemic Heparin and target a goal range of 0.3-0.5 for Impella + Afib.   UPDATE:  6 hr Post-op Heparin level <0.10 - per prior discussion with Dr. Orvan Seen had by second shift pharmacist and per protocol, will start systemic heparin.  Currently, Heparin running in the purge solution is at 5.6 mL/hr (280 units/hr).  Hemoglobin 9.5 - RN notes a tint of blood to urine and MD was aware this and it has been unchanged.   Goal of Therapy:  Heparin level 0.3-0.5 units/ml Monitor platelets by anticoagulation protocol: Yes   Plan:  - Start Systemic Heparin at 350 units/hr - Heparin in purge running at 5.6 ml/hr (280 units/hr)  = Expected total Heparin rate of 630 units/hr (~8.3 units/kg/hr) - lower end of recommended start range due to possible bloody urine - Will monitor by Heparin levels (not ACT) per new protocol - Will check a Heparin level in 6hrs (goal 0.3 to 0.5) and every 6 hours for first 24 hours, then every 12 hours.   Thank you for allowing pharmacy to be a part of this patient's care.  Sloan Leiter, PharmD, BCPS, BCCCP Clinical Pharmacist Please  refer to [facility] for [place] numbers 06/25/2019 3:24 AM

## 2019-06-25 NOTE — Procedures (Signed)
Extubation Procedure Note  Patient Details:   Name: Wendy [name] DOB: 06-Oct-1950 MRN: [id number]   Airway Documentation:    Vent end date: 06/25/19 Vent end time: 1025   Evaluation  O2 sats: stable throughout Complications: No apparent complications Patient did tolerate procedure well. Bilateral Breath Sounds: Clear, Diminished   Yes   Pt extubated per physician order. Pt with positive cuff leak and suctioned orally/via ETT prior. Pt extubated to 3L nasal cannula. Pt able to speak name, give a good cough and no stridor was heard at this time. RT will continue to monitor.   Sharla Kidney 06/25/2019, 10:33 AM

## 2019-06-25 NOTE — Progress Notes (Signed)
BP trending down since cardene started. MAP on cuff reading higher than on impella. Alycia Rossetti MD to clarify. Will go by pressure on impella for now. Nitro gtt weaning.

## 2019-06-25 NOTE — Progress Notes (Signed)
Attempted arterial line insertion was able to get good blood return but was unable to thread the catheter. No hematoma noted. RN and CCM made aware

## 2019-06-25 NOTE — Progress Notes (Addendum)
[facility]  Subjective  - Intubated, but alert and following commands.   Objective (!) 123/93 92 98.6 F (37 C) (!) 28 100%  Intake/Output Summary (Last 24 hours) at 06/25/2019 0750 Last data filed at 06/25/2019 0700 Gross per 24 hour  Intake 2813.08 ml  Output 1335 ml  Net 1478.08 ml    Abdominal incision healing well, abdomin soft. Doppler signals DP B LE, active range of motion intact. Right groin drainage keep dry 4 x 4.  Assessment/Planning: 69 year old female postop day 7 status post aortobifemoral bypass by Dr. Donzetta Matters.  Impella 5.5 placement 4/19 for cardiogenic shock.   Off pressors, BP hypertensive on Neo Balloon pump cardiac support  Hypokalemia replenished Heparin  Amiodarone Possible cardiac intervention tomorrow in Cath lab.  B groins can not be accessed.    Roxy Horseman 06/25/2019 7:50 AM --  Laboratory Lab Results: Recent Labs    06/24/19 1458 06/24/19 1459 06/25/19 0423 06/25/19 0442  WBC 11.9*  --   --  9.0  HGB 10.3*   < > 9.2* 10.0*  HCT 34.1*   < > 27.0* 30.6*  PLT 301  --   --  147*   < > = values in this interval not displayed.   BMET Recent Labs    06/24/19 2049 06/24/19 2223 06/25/19 0423 06/25/19 0442  NA 147*   < > 148* 148*  K 3.0*   < > 3.0* 3.1*  CL 114*  --   --  116*  CO2 20*  --   --  24  GLUCOSE 248*  --   --  123*  BUN 15  --   --  16  CREATININE 1.13*  --   --  1.22*  CALCIUM 8.2*  --   --  8.1*   < > = values in this interval not displayed.    COAG Lab Results  Component Value Date   INR 1.8 (H) 06/24/2019   INR 1.4 (H) 06/18/2019   INR 1.6 (H) 06/14/2019   No results found for: PTT  I have seen and evaluated the patient. I agree with the PA note as documented above. POD#7 s/p aortobifem.  Moved to ICU yesterday afternoon and intubated for cardiogenic shock.  Went to OR with CT surgery and left axillary impella  Placement.   N: Following commands,  intubated Pulm: Intubated, wean sedation, possible extubation with CCM CV: Impella with CO 3.5 L+, LA cleared, mottling improved, Hgb stable 10 GI: NPO, NG Renal: UOP 20-40 mL/hr, Cr stable 1.22 FEN: LA cleared to 1.8 from 8+ Doppler signals BLE, feet warm, color improved Serous drainage right groin, keep dry dressing  Overall looks much better. Appreciate cardiology, CT surgery, CCM.  Possible cath with cardiology?? Would avoid groins.  Marty Heck, MD [facility] Office: [phone]

## 2019-06-25 NOTE — Progress Notes (Signed)
ANTICOAGULATION CONSULT NOTE - Follow-Up Consult  Pharmacy Consult for Heparin Indication: Afib + new Impella  Patient Measurements: Weight: 79.8 kg (175 lb 14.8 oz)  Ideal body weight: 47.8 kg (105 lb 6.1 oz) Adjusted ideal body weight: 60.6 kg (133 lb 9.6 oz)  Heparin Dosing Weight: 65 kg  Vital Signs: Temp: 99.5 F (37.5 C) (04/20 1906) Temp Source: Core (04/20 1600) BP: 115/68 (04/20 1845) Pulse Rate: 98 (04/20 1906)  Labs: Recent Labs    06/24/19 0320 06/24/19 0320 06/24/19 1458 06/24/19 1458 06/24/19 1459 06/24/19 2024 06/24/19 2049 06/24/19 2223 06/24/19 2223 06/24/19 2315 06/25/19 0239 06/25/19 0423 06/25/19 0442 06/25/19 0800 06/25/19 0944 06/25/19 1130 06/25/19 1835  HGB 10.3*   < > 10.3*  --    < >   < >  --  9.5*   < >  --   --  9.2* 10.0*  --   --   --   --   HCT 32.9*   < > 34.1*  --    < >   < >  --  28.0*  --   --   --  27.0* 30.6*  --   --   --   --   PLT 162  --  301  --   --   --   --   --   --   --   --   --  147*  --   --   --   --   LABPROT  --   --  20.8*  --   --   --   --   --   --   --   --   --   --   --   --   --   --   INR  --   --  1.8*  --   --   --   --   --   --   --   --   --   --   --   --   --   --   HEPARINUNFRC 0.26*   < >  --   --   --   --   --   --   --   --  <0.10*  --   --   --   --  <0.10* 0.10*  CREATININE 1.26*   < > 1.33*  --   --   --  1.13*  --   --   --   --   --  1.22*  --   --   --   --   CKTOTAL  --   --  86  --   --   --   --   --   --   --   --   --   --   --   --   --   --   CKMB  --   --  11.2*  --   --   --   --   --   --   --   --   --   --   --   --   --   --   TROPONINIHS  --   --  1,802*   < >  --   --   --   --   --  2,957*  --   --   --  2,749* 2,533*  --   --    < > =  values in this interval not displayed.    Estimated Creatinine Clearance: 42.8 mL/min (A) (by C-G formula based on SCr of 1.22 mg/dL (H)).   Medications:  Infusions:  . sodium chloride 10 mL/hr at 06/25/19 1900  . amiodarone 60  mg/hr (06/25/19 1900)  . epinephrine Stopped (06/24/19 1719)  . impella catheter heparin 50 unit/mL in dextrose 5%    . heparin 550 Units/hr (06/25/19 1900)  . lactated ringers 20 mL/hr at 06/25/19 1900  . niCARDipine 5 mg/hr (06/25/19 1900)  . nitroGLYCERIN Stopped (06/25/19 1304)    Assessment: 10 YOF who underwent aortobifemoral bypass on 4/13 and was noted to have post-op Afib with RVR and was initiated on Heparin IV. On 4/19 the patient went into respiratory distress requiring intubation, followed by a cardiac arrest. ECHO at the time showed a low EF of 10-15% and the patient was taken emergently to the OR for Impella placement. The patient is now s/p OR and pharmacy is monitor for anticoagulation with Heparin given the Impella 5.5 + Afib.  Systemic heparin was initiated 6 hours postop.   Purge solution is providing 300 units/hr of heparin. Systemic heparin currently infusing at 350 units/hr. Total 650 units/hr of heparin (10 units/kg/hr).  Heparin level is subtherapeutic at 0.1  Per protocol, systemic heparin rate not to exceed 1000 units/hr or 10 units/kg/hr unless direct conversation with surgeon  Goal of Therapy:  Heparin level 0.2-0.5 units/ml Monitor platelets by anticoagulation protocol: Yes   Plan:  - Increase systemic heparin to 700 units/hr - Will monitor by heparin levels (not ACT) per new protocol - Will check a heparin level in 6 hours for first 24 hours, then every 12 hours.   Thank you for allowing pharmacy to be a part of this patient's care.  Alanda Slim, PharmD, [facility] Clinical Pharmacist Please see AMION for all Pharmacists' Contact Phone Numbers 06/25/2019, 7:22 PM

## 2019-06-25 NOTE — Progress Notes (Signed)
  Echocardiogram 2D Echocardiogram has been performed.  Jannett Celestine 06/25/2019, 8:21 AM

## 2019-06-25 NOTE — Progress Notes (Signed)
Wasted 75cc Fentanyl in stericycle with Eloise Harman, RN.

## 2019-06-25 NOTE — Progress Notes (Addendum)
Per Atkins MD - MAP goal around 80.

## 2019-06-25 NOTE — Progress Notes (Signed)
Notified Aundra Dubin MD about extubating patient. Also told him about HR still a fib but 100s-110s rather than 80s as before. Orders received to increase amiodarone gtt to 60 and to switch from nitroglycerin to nicardipine gtt for BP support.

## 2019-06-25 NOTE — Anesthesia Postprocedure Evaluation (Signed)
Anesthesia Post Note  Patient: Wendy [name]  Procedure(s) Performed: PLACEMENT OF IMPELLA LEFT VENTRICULAR ASSIST DEVICE (N/A )     Patient location during evaluation: ICU Anesthesia Type: General Level of consciousness: sedated and patient remains intubated per anesthesia plan Pain management: pain level controlled Vital Signs Assessment: post-procedure vital signs reviewed and stable Respiratory status: patient remains intubated per anesthesia plan Cardiovascular status: stable Postop Assessment: no apparent nausea or vomiting Anesthetic complications: no    Last Vitals:  Vitals:   06/25/19 0930 06/25/19 0945  BP: 101/78 102/83  Pulse: 95 (!) 110  Resp: 17 19  Temp: 36.9 C 36.9 C  SpO2: 99% 99%    Last Pain:  Vitals:   06/25/19 0800  TempSrc: Core  PainSc:                  Audry Pili

## 2019-06-25 NOTE — Progress Notes (Signed)
ANTICOAGULATION CONSULT NOTE - Follow-Up Consult  Pharmacy Consult for Heparin Indication: Afib + new Impella  Patient Measurements: Weight: 79.8 kg (175 lb 14.8 oz)  Ideal body weight: 47.8 kg (105 lb 6.1 oz) Adjusted ideal body weight: 60.6 kg (133 lb 9.6 oz)  Heparin Dosing Weight: 65 kg  Vital Signs: Temp: 99.1 F (37.3 C) (04/20 1145) Temp Source: Core (04/20 0800) BP: 114/85 (04/20 1145) Pulse Rate: 102 (04/20 1145)  Labs: Recent Labs    06/24/19 0320 06/24/19 0320 06/24/19 1458 06/24/19 1458 06/24/19 1459 06/24/19 2024 06/24/19 2049 06/24/19 2223 06/24/19 2223 06/24/19 2315 06/25/19 0239 06/25/19 0423 06/25/19 0442 06/25/19 0800 06/25/19 0944 06/25/19 1130  HGB 10.3*   < > 10.3*  --    < >   < >  --  9.5*   < >  --   --  9.2* 10.0*  --   --   --   HCT 32.9*   < > 34.1*  --    < >   < >  --  28.0*  --   --   --  27.0* 30.6*  --   --   --   PLT 162  --  301  --   --   --   --   --   --   --   --   --  147*  --   --   --   LABPROT  --   --  20.8*  --   --   --   --   --   --   --   --   --   --   --   --   --   INR  --   --  1.8*  --   --   --   --   --   --   --   --   --   --   --   --   --   HEPARINUNFRC 0.26*  --   --   --   --   --   --   --   --   --  <0.10*  --   --   --   --  <0.10*  CREATININE 1.26*   < > 1.33*  --   --   --  1.13*  --   --   --   --   --  1.22*  --   --   --   CKTOTAL  --   --  86  --   --   --   --   --   --   --   --   --   --   --   --   --   CKMB  --   --  11.2*  --   --   --   --   --   --   --   --   --   --   --   --   --   TROPONINIHS  --   --  1,802*   < >  --   --   --   --   --  2,957*  --   --   --  2,749* 2,533*  --    < > = values in this interval not displayed.    Estimated Creatinine Clearance: 42.8 mL/min (A) (by C-G formula based on SCr of 1.22 mg/dL (H)).  Medications:  Infusions:  . sodium chloride 10 mL/hr at 06/25/19 1100  . amiodarone 60 mg/hr (06/25/19 1100)  . epinephrine Stopped (06/24/19 1719)  .  fentaNYL infusion INTRAVENOUS Stopped (06/25/19 1018)  . impella catheter heparin 50 unit/mL in dextrose 5%    . heparin 350 Units/hr (06/25/19 1100)  . lactated ringers 20 mL/hr at 06/25/19 1100  . niCARDipine 5 mg/hr (06/25/19 1144)  . nitroGLYCERIN 100 mcg/min (06/25/19 1100)    Assessment: 13 YOF who underwent aortobifemoral bypass on 4/13 and was noted to have post-op Afib with RVR and was initiated on Heparin IV. On 4/19 the patient went into respiratory distress requiring intubation, followed by a cardiac arrest. ECHO at the time showed a low EF of 10-15% and the patient was taken emergently to the OR for Impella placement. The patient is now s/p OR and pharmacy is monitor for anticoagulation with Heparin given the Impella 5.5 + Afib.  Systemic heparin was initiated 6 hours postop.   Purge solution is providing 300 units/hr of heparin. Systemic heparin currently infusing at 350 units/hr. Total 650 units/hr of heparin (10 units/kg/hr).  Heparin level remains undetectable  Per protocol, systemic heparin rate not to exceed 1000 units/hr or 10 units/kg/hr unless direct conversation with surgeon  Goal of Therapy:  Heparin level 0.2-0.5 units/ml Monitor platelets by anticoagulation protocol: Yes   Plan:  - Increase systemic heparin to 550 units/hr - ok with MD - Heparin in purge running at 6 ml/hr (300 units/hr)  = Expected total heparin rate of 850 units/hr (~13 units/kg/hr) - Will monitor by heparin levels (not ACT) per new protocol - Will check a heparin level in 6 hours for first 24 hours, then every 12 hours.   Thank you for allowing pharmacy to be a part of this patient's care.  Vertis Kelch, PharmD, [facility] PGY2 Cardiology Pharmacy Resident Phone [phone] 06/25/2019       12:38 PM  Please check [email or web address] for unit-specific pharmacist phone numbers

## 2019-06-25 NOTE — Consult Note (Addendum)
NAME:  Wendy [name], MRN:  [id number], DOB:  06/18/1950, LOS: 7 ADMISSION DATE:  06/18/2019, CONSULTATION DATE:  06/24/2019 REFERRING MD:  Dr. Carlis Abbott CHIEF COMPLAINT:  Hypoxia   Brief History   Wendy [name] is a 69 y/o female with a PMHx of CAD, HFpEF, severe PAD who presents to the hospital for a scheduled aortobifemoral bypass. Her admission has been complicated by acute hypoxic respiratory failure today that was preceded by diaphoresis, AMS, and tachycardia. She subsequently went into A. Fib with RVR and received intubation for airway protection. PCCM was consulted for ventilation management.   Past Medical History   Past Medical History:  Diagnosis Date  . Anemia   . Anginal pain ([place])    jaw pain 07/2016, s/p 07/15/16 nuclear stress test  . Anxiety   . Arthritis    "lower back" (08/24/2017)  . Asthma   . CAD (coronary artery disease), native coronary artery    a. Hx CABG 1998, last PCI 2013 b.LHC 07/2013:  EF 55-60%, L-LAD prob atretic, no sig disease in LAD, S-OM1 ok with patent stent, S-dRCA ok => med Rx. c. LHC 01/2015 DES to SVG-RCA.  Marland Kitchen Chronic back pain   . Chronic leg pain   . Chronic lower back pain   . COPD (chronic obstructive pulmonary disease) ([place])   . Depression   . DVT (deep venous thrombosis) ([facility]) X 3   RLE  . Fall 08/2016  . GERD (gastroesophageal reflux disease)   . Glucose intolerance (impaired glucose tolerance)   . Headache    "a few/month" (08/24/2017)  . HLD (hyperlipidemia)   . Hypertension   . Myocardial infarction ([place]) 1999  . OSA on CPAP   . PAD (peripheral artery disease) ([place])   . PAF (paroxysmal atrial fibrillation) ([place])    a. 09/2013 post-op b. 01/2015  . Peripheral neuropathy   . Pneumonia   . Shortness of breath    when walking  . Wears glasses    Significant Hospital Events   4/13: Admitted 4/14: A. Fib with RVR 4/19: PEA cardiac arrest with ROSC  Consults:  Vascular Surgery, Cardiology, Nephrology, Advanced Heart  Failure, TCTS  Procedures:  4/13: Aorto-bifemoral bypass and reimplantation of right profunda femoris artery on aortobifemoral limb by Dr. Donzetta Matters  4/19: Placement of Impella LVAD by Dr. Orvan Seen   Significant Diagnostic Tests:   Echo 06/17/19 1. Left ventricular ejection fraction, by estimation, is 55 to 60%. The  left ventricle has normal function. The left ventricle demonstrates  regional wall motion abnormalities (see scoring diagram/findings for  description). Left ventricular diastolic  parameters were normal.  2. Right ventricular systolic function is low normal. The right  ventricular size is normal. There is moderately elevated pulmonary artery  systolic pressure.  3. The mitral valve is normal in structure. Mild mitral valve  regurgitation.  4. The aortic valve is normal in structure. Aortic valve regurgitation is  not visualized.  5. The inferior vena cava is normal in size with greater than 50%  respiratory variability, suggesting right atrial pressure of 3 mmHg.  Echo 06/24/19 1. Left ventricular ejection fraction, by estimation, is 20 to 25%. The  left ventricle has severely decreased function. The left ventricle  demonstrates global hypokinesis. Left ventricular diastolic parameters are  consistent with Grade III diastolic  dysfunction (restrictive).  2. Right ventricular systolic function is normal. The right ventricular  size is normal.  3. The mitral valve is normal in structure. Mild mitral valve  regurgitation.  No evidence of mitral stenosis.  4. The aortic valve is normal in structure. Aortic valve regurgitation is  not visualized. No aortic stenosis is present.  5. The inferior vena cava is normal in size with greater than 50%  respiratory variability, suggesting right atrial pressure of 3 mmHg.  CXR 06/25/19 Stable support apparatus. Slightly improved bilateral lung opacities are noted suggesting improving edema.  Micro Data:   N/A  Antimicrobials:  N/A  Interim history/subjective:   Patient in calm and sedated on examination. Able to follow commands.   Impella placement occurred yesterday afternoon, patient tolerated the procedure well.   Objective   Blood pressure 104/81, pulse 97, temperature 99 F (37.2 C), resp. rate 18, weight 79.8 kg, SpO2 98 %. PAP: (26-72)/(10-53) 26/10 CVP:  [0 mmHg-36 mmHg] 0 mmHg CO:  [3.2 L/min-4.2 L/min] 4.1 L/min CI:  [1.9 L/min/m2-2.4 L/min/m2] 2.4 L/min/m2  Vent Mode: PSV;CPAP FiO2 (%):  [40 %-100 %] 40 % Set Rate:  [18 bmp-28 bmp] 28 bmp Vt Set:  [400 mL] 400 mL PEEP:  [5 cmH20-10 cmH20] 5 cmH20 Pressure Support:  [10 cmH20] 10 cmH20 Plateau Pressure:  [17 cmH20-24 cmH20] 17 cmH20   Intake/Output Summary (Last 24 hours) at 06/25/2019 1141 Last data filed at 06/25/2019 1100 Gross per 24 hour  Intake 3234.88 ml  Output 2395 ml  Net 839.88 ml   Filed Weights   06/23/19 0442 06/24/19 0529 06/25/19 0430  Weight: 79.4 kg 75.9 kg 79.8 kg   Physical Exam Vitals and nursing note reviewed.  Constitutional:      Appearance: She is obese.  Pulmonary:     Breath sounds: No wheezing, rhonchi or rales.  Musculoskeletal:     Right lower leg: Edema (trace pitting) present.     Left lower leg: Edema (trace pitting) present.  Skin:    General: Skin is warm and dry.     Coloration: Skin is cyanotic and mottled.     Comments: No mottling noted on examination today.   Neurological:     General: No focal deficit present.    Resolved Hospital Problem list   None   Assessment & Plan:   # Acute Hypoxic Respiratory Failure  Secondary to a. Fib with RVR and subsequent cardiogenic shock. Minimal vent settings today with improvement in neurological status. Will trial PCS/CPAP today with plan to hopefully extubate.   - PCS/CPAP trial - Consider extubation this early afternoon  # Cardiogenic Shock # s/p PEA Arrest   Hx of severe triple vessel CAS with onset of A fib with  RVR today that likely led to an ischemic event as noted on TTE (4/19) that showed severe left ventricular wall motion abnormality. Advanced HF was contacted who has discussed case with TCTS.Patient was taken to the OR for Impella placement. This AM, patient was off pressor support.   # A. Fib with RVR - Amiodarone gtt - Cardene gtt  # Acute HFrEF  # CAD Concerning for ischemic event yesterday although troponin only elevated to the low 2000s with no concerning ST changes on EKG. Currently being stabilized with Impella. Current plan for angiography this week.   - Management per AHF  # PAD S/p aortobifemoral bypass on 4/13.   - Management per Vascular surgery   Best practice:  Diet: NPO Pain/Anxiety/Delirium protocol (if indicated): Ordered VAP protocol (if indicated): Ordered DVT prophylaxis: Heparin GI prophylaxis: Pepcid Glucose control: Not indicated Mobility: Bedrest  Code Status: FULL Family Communication: Sister updated via telephone by Dr. Carlis Abbott  and Dr. Benjamine Mola Disposition: ICU  Labs   CBC: Recent Labs  Lab 06/22/19 0341 06/22/19 0341 06/22/19 2243 06/22/19 2243 06/24/19 0320 06/24/19 0320 06/24/19 1458 06/24/19 1459 06/24/19 1909 06/24/19 2024 06/24/19 2223 06/25/19 0423 06/25/19 0442  WBC 4.1  --  5.6  --  5.0  --  11.9*  --   --   --   --   --  9.0  NEUTROABS  --   --   --   --   --   --  7.9*  --   --   --   --   --   --   HGB 7.7*   < > 9.3*   < > 10.3*   < > 10.3*   < > 8.2* 9.2* 9.5* 9.2* 10.0*  HCT 26.0*   < > 29.8*   < > 32.9*   < > 34.1*   < > 24.0* 27.0* 28.0* 27.0* 30.6*  MCV 96.3  --  93.4  --  92.2  --  96.9  --   --   --   --   --  91.1  PLT 122*  --  141*  --  162  --  301  --   --   --   --   --  147*   < > = values in this interval not displayed.    Basic Metabolic Panel: Recent Labs  Lab 06/18/19 1240 06/18/19 1240 06/19/19 0244 06/19/19 1239 06/20/19 1322 06/20/19 1322 06/21/19 0508 06/21/19 0508 06/22/19 0341 06/22/19 1616  06/23/19 0448 06/23/19 0448 06/24/19 0320 06/24/19 0320 06/24/19 1458 06/24/19 1459 06/24/19 2024 06/24/19 2049 06/24/19 2223 06/24/19 2315 06/25/19 0423 06/25/19 0442  NA 139   < > 138   < > 144   < > 144   < > 145   < > 144   < > 146*   < > 144   < > 148* 147* 149*  --  148* 148*  K 4.4   < > 4.2   < > 4.5   < > 4.7   < > 4.3   < > 4.1   < > 4.0   < > 5.2*   < > 2.7* 3.0* 2.9*  --  3.0* 3.1*  CL 109   < > 106   < > 112*   < > 116*   < > 118*   < > 117*  --  114*  --  116*  --   --  114*  --   --   --  116*  CO2 22   < > 23   < > 21*   < > 18*   < > 19*   < > 19*  --  20*  --  12*  --   --  20*  --   --   --  24  GLUCOSE 196*   < > 150*   < > 120*   < > 91   < > 93   < > 98  --  113*  --  246*  --   --  248*  --   --   --  123*  BUN 25*   < > 31*   < > 31*   < > 29*   < > 25*   < > 19  --  15  --  15  --   --  15  --   --   --  16  CREATININE 1.40*   < > 2.28*   < > 2.81*   < > 2.46*   < > 2.14*   < > 1.77*  --  1.26*  --  1.33*  --   --  1.13*  --   --   --  1.22*  CALCIUM 8.0*   < > 7.9*   < > 8.1*   < > 8.3*   < > 8.1*   < > 8.3*  --  8.5*  --  7.5*  --   --  8.2*  --   --   --  8.1*  MG 1.4*  --  2.3  --  1.8  --   --   --   --   --   --   --   --   --   --   --   --   --   --  1.8  --  1.7  PHOS  --   --   --    < > 4.9*  --  4.9*  --  4.1  --  3.8  --  3.0  --   --   --   --   --   --   --   --   --    < > = values in this interval not displayed.   GFR: Estimated Creatinine Clearance: 42.8 mL/min (A) (by C-G formula based on SCr of 1.22 mg/dL (H)). Recent Labs  Lab 06/22/19 2243 06/24/19 0320 06/24/19 1449 06/24/19 1458 06/24/19 2049 06/24/19 2315 06/25/19 0442  WBC 5.6 5.0  --  11.9*  --   --  9.0  LATICACIDVEN  --   --  8.3*  --  4.4* 3.3* 1.8    Liver Function Tests: Recent Labs  Lab 06/19/19 0244 06/19/19 2304 06/22/19 0341 06/23/19 0448 06/24/19 0320 06/24/19 1458 06/25/19 0442  AST 20  --   --   --   --  52* 79*  ALT 13  --   --   --   --  20 40    ALKPHOS 38  --   --   --   --  99 74  BILITOT 0.5  --   --   --   --  0.8 1.0  PROT 5.0*  --   --   --   --  4.7* 4.2*  ALBUMIN 2.3*   < > 2.1* 2.0* 2.2* 1.8* 2.2*   < > = values in this interval not displayed.   Recent Labs  Lab 06/19/19 0244  AMYLASE 61   No results for input(s): AMMONIA in the last 168 hours.  ABG    Component Value Date/Time   PHART 7.461 (H) 06/25/2019 0423   PCO2ART 34.3 06/25/2019 0423   PO2ART 180.0 (H) 06/25/2019 0423   HCO3 24.4 06/25/2019 0423   TCO2 25 06/25/2019 0423   ACIDBASEDEF 2.0 06/24/2019 2223   O2SAT 75.8 06/25/2019 0830     Coagulation Profile: Recent Labs  Lab 06/18/19 1240 06/24/19 1458  INR 1.4* 1.8*    Cardiac Enzymes: Recent Labs  Lab 06/24/19 1458  CKTOTAL 86  CKMB 11.2*    HbA1C: Hgb A1c MFr Bld  Date/Time Value Ref Range Status  06/24/2019 11:18 PM 5.4 4.8 - 5.6 % Final    Comment:    (NOTE) Pre diabetes:          5.7%-6.4% Diabetes:              >  6.4% Glycemic control for   <7.0% adults with diabetes   08/15/2016 04:39 PM 4.9 4.8 - 5.6 % Final    Comment:    (NOTE)         Pre-diabetes: 5.7 - 6.4         Diabetes: >6.4         Glycemic control for adults with diabetes: <7.0     CBG: Recent Labs  Lab 06/24/19 2029 06/25/19 0001 06/25/19 0347 06/25/19 0759 06/25/19 1105  GLUCAP 228* 178* 103* 118* 136*    Review of Systems:   Unable to obtain   Past Medical History  She,  has a past medical history of Anemia, Anginal pain ([place]), Anxiety, Arthritis, Asthma, CAD (coronary artery disease), native coronary artery, Chronic back pain, Chronic leg pain, Chronic lower back pain, COPD (chronic obstructive pulmonary disease) ([place]), Depression, DVT (deep venous thrombosis) ([facility]) (X 3), Fall (08/2016), GERD (gastroesophageal reflux disease), Glucose intolerance (impaired glucose tolerance), Headache, HLD (hyperlipidemia), Hypertension, Myocardial infarction ([place]) (1999), OSA on CPAP, PAD (peripheral artery  disease) ([place]), PAF (paroxysmal atrial fibrillation) ([place]), Peripheral neuropathy, Pneumonia, Shortness of breath, and Wears glasses.   Surgical History    Past Surgical History:  Procedure Laterality Date  . ABDOMINAL AORTOGRAM W/LOWER EXTREMITY Bilateral 04/29/2019   Procedure: ABDOMINAL AORTOGRAM W/LOWER EXTREMITY;  Surgeon: Waynetta Sandy, MD;  Location: [facility];  Service: Cardiovascular;  Laterality: Bilateral;  . ANTERIOR CERVICAL DECOMP/DISCECTOMY FUSION  1991  . AORTA - BILATERAL FEMORAL ARTERY BYPASS GRAFT N/A 06/18/2019   Procedure: AORTA BIFEMORAL BYPASS GRAFT;  Surgeon: Waynetta Sandy, MD;  Location: [facility];  Service: Vascular;  Laterality: N/A;  . BACK SURGERY    . CARDIAC CATHETERIZATION N/A 01/22/2015   Procedure: Left Heart Cath and Coronary Angiography;  Surgeon: Troy Sine, MD;  Location: [facility];  Service: Cardiovascular;  Laterality: N/A;  . CARDIAC CATHETERIZATION N/A 01/22/2015   Procedure: Coronary Stent Intervention;  Surgeon: Troy Sine, MD;  Location: [facility];  Service: Cardiovascular;  Laterality: N/A;  3.0x12 xience prox SVG to RCA  . CARDIAC CATHETERIZATION  03/09/2000   hx/notes 03/20/2000  . CHOLECYSTECTOMY N/A 09/16/2013   Procedure: LAPAROSCOPIC CHOLECYSTECTOMY CONVERTED TO OPEN CHOLECYSTECTOMY WITH CHOLANGIOGRAM;  Surgeon: [name], MD;  Location: [facility];  Service: General;  Laterality: N/A;  . CORONARY ANGIOPLASTY WITH STENT PLACEMENT  11/2011, 02/2012, 01/2015   DES to SVG-RCA both times, In [place], [place], Dr. Bruce Donath  . CORONARY ARTERY BYPASS GRAFT  1998   LIMA-LAD, SVG-OM, SVG-RCA  . ERCP N/A 09/19/2013   Procedure: ENDOSCOPIC RETROGRADE CHOLANGIOPANCREATOGRAPHY (ERCP);  Surgeon: [name], MD;  Location: [facility];  Service: Endoscopy;  Laterality: N/A;  . FINGER SURGERY Right    ring finger "fused"  . FRACTURE SURGERY    . IRRIGATION AND DEBRIDEMENT ABSCESS Right 10/17/2013     Procedure: INCISION AND DRAINAGE RIGHT SUBCOSTAL WOUND;  Surgeon: [name], MD;  Location: [facility];  Service: General;  Laterality: Right;  . JOINT REPLACEMENT    . KNEE ARTHROSCOPY Right 1998  . LEFT HEART CATH AND CORS/GRAFTS ANGIOGRAPHY N/A 07/25/2016   Procedure: Left Heart Cath and Cors/Grafts Angiography;  Surgeon: Jettie Booze, MD;  Location: [facility];  Service: Cardiovascular;  Laterality: N/A;  . LEFT HEART CATH AND CORS/GRAFTS ANGIOGRAPHY N/A 06/05/2019   Procedure: LEFT HEART CATH AND CORS/GRAFTS ANGIOGRAPHY;  Surgeon: Burnell Blanks, MD;  Location: [facility];  Service: Cardiovascular;  Laterality: N/A;  . LEFT HEART CATHETERIZATION WITH CORONARY/GRAFT ANGIOGRAM N/A 07/19/2013   Procedure: LEFT HEART CATHETERIZATION WITH Beatrix Fetters;  Surgeon: Leonie Man, MD;  Location: [facility];  Service: Cardiovascular;  Laterality: N/A;  . MASS EXCISION Right 06/25/2014   Procedure: EXCISION BUTTOCK MASS ;  Surgeon: Coralie Keens, MD;  Location: [facility];  Service: General;  Laterality: Right;  . ORIF HUMERUS FRACTURE Left 08/27/2013   Procedure: OPEN REDUCTION INTERNAL FIXATION (ORIF) LEFT HUMERUS ;  Surgeon: Rozanna Box, MD;  Location: [facility];  Service: Orthopedics;  Laterality: Left;  . POSTERIOR FUSION CERVICAL SPINE  1992  . THROMBECTOMY / EMBOLECTOMY FEMORAL ARTERY Right    hx/notes 03/20/2000  . TOTAL HIP ARTHROPLASTY Right 07/19/2016   Procedure: TOTAL HIP ARTHROPLASTY ANTERIOR APPROACH;  Surgeon: [name], MD;  Location: [facility];  Service: Orthopedics;  Laterality: Right;  . TOTAL HIP REVISION Right 08/22/2017   Procedure: TOTAL HIP REVISION;  Surgeon: [name], MD;  Location: [facility];  Service: Orthopedics;  Laterality: Right;     Social History   reports that she quit smoking about 20 years ago. Her smoking use included cigarettes. She has a 17.00 pack-year smoking history. She has never used  smokeless tobacco. She reports previous alcohol use. She reports current drug use. Drug: Marijuana.   Family History   Her family history includes Clotting disorder in her father; Heart disease in her brother and brother; Lung cancer in her mother and sister. There is no history of Colon cancer or Breast cancer.   Allergies Allergies  Allergen Reactions  . Benadryl [Diphenhydramine] Shortness Of Breath  . Penicillins Diarrhea    Did it involve swelling of the face/tongue/throat, SOB, or low BP? No Did it involve sudden or severe rash/hives, skin peeling, or any reaction on the inside of your mouth or nose? No Did you need to seek medical attention at a hospital or doctor's office? No When did it last happen?2 months If all above answers are "NO", may proceed with cephalosporin use.  . Adhesive [Tape] Other (See Comments)    Burning of skin  . Amoxicillin Diarrhea and Other (See Comments)    Has patient had a PCN reaction causing immediate rash, facial/tongue/throat swelling, SOB or lightheadedness with hypotension: No Has patient had a PCN reaction causing severe rash involving mucus membranes or skin necrosis: No Has patient had a PCN reaction that required hospitalization No Has patient had a PCN reaction occurring within the last 10 years: Yes -- noted rxn as diarrhea If all of the above answers are "NO", then may proceed with Cephalosporin use.   . Cyclobenzaprine Other (See Comments)    Causes restless leg syndrome Restless syndrome  . Hydrocodone Itching     Home Medications  Prior to Admission medications   Medication Sig Start Date End Date Taking? Authorizing Provider  acetaminophen (TYLENOL) 500 MG tablet Take 500-1,000 mg by mouth every 6 (six) hours as needed (for pain.).   Yes [provider]  albuterol (PROVENTIL HFA;VENTOLIN HFA) 108 (90 Base) MCG/ACT inhaler Inhale 2 puffs into the lungs every 6 (six) hours as needed for wheezing or shortness of  breath. 01/01/18  Yes Bhagat, Bhavinkumar, PA  ALPRAZolam (XANAX) 0.5 MG tablet Take 0.5 mg by mouth 3 (three) times daily as needed for anxiety.   Yes [provider]  apixaban (ELIQUIS) 5 MG TABS tablet Take 1 tablet (5 mg total) by mouth 2 (  two) times daily. 11/20/18  Yes Nahser, Wonda Cheng, MD  atorvastatin (LIPITOR) 80 MG tablet TAKE ONE TABLET BY MOUTH DAILY AT 6PM Patient taking differently: Take 80 mg by mouth daily at 6 PM. TAKE ONE TABLET BY MOUTH DAILY AT 6PM 11/20/18  Yes Nahser, Wonda Cheng, MD  buPROPion (WELLBUTRIN XL) 150 MG 24 hr tablet Take 150 mg by mouth daily.   Yes [provider]  ciprofloxacin (CIPRO) 500 MG tablet Take 1 tablet (500 mg total) by mouth 2 (two) times daily. 06/14/19  Yes Waynetta Sandy, MD  clopidogrel (PLAVIX) 75 MG tablet Take 75 mg by mouth daily.   Yes [provider]  diclofenac Sodium (VOLTAREN) 1 % GEL Apply 1 g topically 4 (four) times daily as needed for pain. 02/15/19  Yes [provider]  ezetimibe (ZETIA) 10 MG tablet Take 10 mg by mouth daily.   Yes [provider]  gabapentin (NEURONTIN) 300 MG capsule Take 300 mg by mouth 3 (three) times daily as needed for pain. 02/15/19  Yes [provider]  isosorbide mononitrate (IMDUR) 60 MG 24 hr tablet Take 1.5 tablets (90 mg total) by mouth every morning. Patient taking differently: Take 60 mg by mouth daily.  02/15/19  Yes Nahser, Wonda Cheng, MD  metoprolol tartrate (LOPRESSOR) 50 MG tablet Take 1 tablet (50 mg total) by mouth 2 (two) times daily. 11/20/18  Yes Nahser, Wonda Cheng, MD  nitroGLYCERIN (NITROSTAT) 0.4 MG SL tablet Place 1 tablet (0.4 mg total) under the tongue every 5 (five) minutes as needed for chest pain. 11/20/18  Yes Nahser, Wonda Cheng, MD  Oxycodone HCl 10 MG TABS Take 10 mg by mouth 3 (three) times daily. 04/11/19  Yes [provider]  pantoprazole (PROTONIX) 40 MG tablet Take 1 tablet (40 mg total) by mouth daily. 11/20/18  Yes  Nahser, Wonda Cheng, MD  pramipexole (MIRAPEX) 0.25 MG tablet Take 0.25 mg by mouth at bedtime.  02/18/19  Yes [provider]  ranolazine (RANEXA) 1000 MG SR tablet Take 1,000 mg by mouth 2 (two) times daily.   Yes [provider]  sertraline (ZOLOFT) 100 MG tablet Take 150 mg by mouth at bedtime.    Yes [provider]  valsartan-hydrochlorothiazide (DIOVAN-HCT) 160-12.5 MG tablet Take 1 tablet by mouth daily.  03/14/19  Yes [provider]  furosemide (LASIX) 20 MG tablet Take 1 tablet (20 mg total) by mouth daily as needed. Patient taking differently: Take 20 mg by mouth daily as needed for fluid.  11/20/18 11/20/19  Nahser, Wonda Cheng, MD    Dr. Jose Persia Internal Medicine PGY-1  Pager: [phone] 06/25/2019, 11:41 AM   PCCM attending:  69 year old female presented to the intensive care unit yesterday in cardiogenic shock.  Bedside echo with depressed ejection fraction cardiology present at bedside and decision made for and decision made for urgent placement of Impella.  Cardiothoracic surgery took patient to the OR yesterday evening.  Of note patient recently underwent aortobifemoral bypass by vascular surgery.  BP 120/73 (BP Location: Left Arm)   Pulse 88   Temp 99.5 F (37.5 C) (Core)   Resp 19   Wt 79.8 kg   SpO2 97%   BMI 33.24 kg/m   General: Patient intubated mechanical life support following commands Neuro: Moves all 4 extremities Heart: Irregular rhythm S1-S2 Lungs: Bilateral mechanically ventilated breath sounds.  Labs: Reviewed Chest x-ray: Reviewed  Vent settings: Reviewed  Assessment: Acute hypoxemic respiratory failure requiring intubation mechanical ventilation secondary cardiogenic  shock Cardiogenic shock status post Impella placement Atrial fibrillation with RVR Peripheral arterial disease status post aortobifemoral bypass on 06/18/2019  Plan: Plan for CPAP pressure support trial consider liberation from  ventilator Currently appears to be awake enough to follow commands and likely follow SBT Continue to observe in the intensive care unit post liberation. Appreciate help from the advanced heart failure service. Continue management of Impella device. Continue amiodarone and Cardene infusion for rate control Continue on heparin per pharmacy.  This patient is critically ill with multiple organ system failure; which, requires frequent high complexity decision making, assessment, support, evaluation, and titration of therapies. This was completed through the application of advanced monitoring technologies and extensive interpretation of multiple databases. During this encounter critical care time was devoted to patient care services described in this note for 31 minutes.  [facility] 06/25/2019 4:25 PM

## 2019-06-26 ENCOUNTER — Encounter: Payer: Self-pay | Admitting: *Deleted

## 2019-06-26 ENCOUNTER — Inpatient Hospital Stay (HOSPITAL_COMMUNITY): Payer: Medicare HMO

## 2019-06-26 DIAGNOSIS — J9601 Acute respiratory failure with hypoxia: Secondary | ICD-10-CM

## 2019-06-26 DIAGNOSIS — Z95811 Presence of heart assist device: Secondary | ICD-10-CM | POA: Diagnosis not present

## 2019-06-26 DIAGNOSIS — R570 Cardiogenic shock: Secondary | ICD-10-CM | POA: Diagnosis not present

## 2019-06-26 LAB — TYPE AND SCREEN
ABO/RH(D): A POS
Antibody Screen: POSITIVE
Unit division: 0
Unit division: 0
Unit division: 0
Unit division: 0
Unit division: 0
Unit division: 0

## 2019-06-26 LAB — COMPREHENSIVE METABOLIC PANEL
ALT: 31 U/L (ref 0–44)
AST: 28 U/L (ref 15–41)
Albumin: 2.2 g/dL — ABNORMAL LOW (ref 3.5–5.0)
Alkaline Phosphatase: 76 U/L (ref 38–126)
Anion gap: 9 (ref 5–15)
BUN: 13 mg/dL (ref 8–23)
CO2: 26 mmol/L (ref 22–32)
Calcium: 7.8 mg/dL — ABNORMAL LOW (ref 8.9–10.3)
Chloride: 109 mmol/L (ref 98–111)
Creatinine, Ser: 1.16 mg/dL — ABNORMAL HIGH (ref 0.44–1.00)
GFR calc Af Amer: 56 mL/min — ABNORMAL LOW (ref >60)
GFR calc non Af Amer: 49 mL/min — ABNORMAL LOW (ref >60)
Glucose, Bld: 134 mg/dL — ABNORMAL HIGH (ref 70–99)
Potassium: 2.9 mmol/L — ABNORMAL LOW (ref 3.5–5.1)
Sodium: 144 mmol/L (ref 135–145)
Total Bilirubin: 1 mg/dL (ref 0.3–1.2)
Total Protein: 4.6 g/dL — ABNORMAL LOW (ref 6.5–8.1)

## 2019-06-26 LAB — BPAM RBC
Blood Product Expiration Date: 202105092359
Blood Product Expiration Date: 202105092359
Blood Product Expiration Date: 202105122359
Blood Product Expiration Date: 202105122359
Blood Product Expiration Date: 202105122359
Blood Product Expiration Date: 202105122359
ISSUE DATE / TIME: 202104171738
ISSUE DATE / TIME: 202104191703
ISSUE DATE / TIME: 202104191742
ISSUE DATE / TIME: 202104191742
ISSUE DATE / TIME: 202104191742
Unit Type and Rh: 6200
Unit Type and Rh: 6200
Unit Type and Rh: 6200
Unit Type and Rh: 6200
Unit Type and Rh: 6200
Unit Type and Rh: 6200

## 2019-06-26 LAB — MAGNESIUM: Magnesium: 1.6 mg/dL — ABNORMAL LOW (ref 1.7–2.4)

## 2019-06-26 LAB — CBC
HCT: 31.1 % — ABNORMAL LOW (ref 36.0–46.0)
HCT: 31.8 % — ABNORMAL LOW (ref 36.0–46.0)
Hemoglobin: 10 g/dL — ABNORMAL LOW (ref 12.0–15.0)
Hemoglobin: 10.3 g/dL — ABNORMAL LOW (ref 12.0–15.0)
MCH: 28.7 pg (ref 26.0–34.0)
MCH: 29.3 pg (ref 26.0–34.0)
MCHC: 32.2 g/dL (ref 30.0–36.0)
MCHC: 32.4 g/dL (ref 30.0–36.0)
MCV: 89.4 fL (ref 80.0–100.0)
MCV: 90.3 fL (ref 80.0–100.0)
Platelets: 153 10*3/uL (ref 150–400)
Platelets: 157 10*3/uL (ref 150–400)
RBC: 3.48 MIL/uL — ABNORMAL LOW (ref 3.87–5.11)
RBC: 3.52 MIL/uL — ABNORMAL LOW (ref 3.87–5.11)
RDW: 16 % — ABNORMAL HIGH (ref 11.5–15.5)
RDW: 16.4 % — ABNORMAL HIGH (ref 11.5–15.5)
WBC: 11.1 10*3/uL — ABNORMAL HIGH (ref 4.0–10.5)
WBC: 11 10*3/uL — ABNORMAL HIGH (ref 4.0–10.5)
nRBC: 0.5 % — ABNORMAL HIGH (ref 0.0–0.2)
nRBC: 0.4 % — ABNORMAL HIGH (ref 0.0–0.2)

## 2019-06-26 LAB — BASIC METABOLIC PANEL
Anion gap: 9 (ref 5–15)
BUN: 12 mg/dL (ref 8–23)
CO2: 26 mmol/L (ref 22–32)
Calcium: 7.6 mg/dL — ABNORMAL LOW (ref 8.9–10.3)
Chloride: 103 mmol/L (ref 98–111)
Creatinine, Ser: 1.08 mg/dL — ABNORMAL HIGH (ref 0.44–1.00)
GFR calc Af Amer: >60 mL/min (ref >60)
GFR calc non Af Amer: 53 mL/min — ABNORMAL LOW (ref >60)
Glucose, Bld: 115 mg/dL — ABNORMAL HIGH (ref 70–99)
Potassium: 3.1 mmol/L — ABNORMAL LOW (ref 3.5–5.1)
Sodium: 138 mmol/L (ref 135–145)

## 2019-06-26 LAB — GLUCOSE, CAPILLARY
Glucose-Capillary: 101 mg/dL — ABNORMAL HIGH (ref 70–99)
Glucose-Capillary: 102 mg/dL — ABNORMAL HIGH (ref 70–99)
Glucose-Capillary: 109 mg/dL — ABNORMAL HIGH (ref 70–99)
Glucose-Capillary: 111 mg/dL — ABNORMAL HIGH (ref 70–99)
Glucose-Capillary: 112 mg/dL — ABNORMAL HIGH (ref 70–99)
Glucose-Capillary: 116 mg/dL — ABNORMAL HIGH (ref 70–99)
Glucose-Capillary: 126 mg/dL — ABNORMAL HIGH (ref 70–99)

## 2019-06-26 LAB — COOXEMETRY PANEL
Carboxyhemoglobin: 1.4 % (ref 0.5–1.5)
Methemoglobin: 0.8 % (ref 0.0–1.5)
O2 Saturation: 77.5 %
Total hemoglobin: 12.8 g/dL (ref 12.0–16.0)

## 2019-06-26 LAB — ECHOCARDIOGRAM LIMITED
Height: 61 in
Weight: 2761.92 oz

## 2019-06-26 LAB — HEPARIN LEVEL (UNFRACTIONATED)
Heparin Unfractionated: 0.2 IU/mL — ABNORMAL LOW (ref 0.30–0.70)
Heparin Unfractionated: 0.1 IU/mL — ABNORMAL LOW (ref 0.30–0.70)
Heparin Unfractionated: 0.14 IU/mL — ABNORMAL LOW (ref 0.30–0.70)

## 2019-06-26 LAB — LACTATE DEHYDROGENASE
LDH: 449 U/L — ABNORMAL HIGH (ref 98–192)
LDH: 384 U/L — ABNORMAL HIGH (ref 98–192)

## 2019-06-26 MED ORDER — FUROSEMIDE 10 MG/ML IJ SOLN
20.0000 mg | Freq: Once | INTRAMUSCULAR | Status: AC
  Administered 2019-06-26: 20 mg via INTRAVENOUS
  Filled 2019-06-26: qty 2

## 2019-06-26 MED ORDER — SODIUM CHLORIDE 0.9 % IV SOLN: INTRAVENOUS | Status: DC

## 2019-06-26 MED ORDER — POTASSIUM CHLORIDE CRYS ER 20 MEQ PO TBCR
40.0000 meq | EXTENDED_RELEASE_TABLET | Freq: Once | ORAL | Status: AC
  Administered 2019-06-26: 40 meq via ORAL
  Filled 2019-06-26: qty 2

## 2019-06-26 MED ORDER — SODIUM CHLORIDE 0.9% FLUSH
3.0000 mL | Freq: Two times a day (BID) | INTRAVENOUS | Status: DC
  Administered 2019-06-26 – 2019-06-27 (×3): 3 mL via INTRAVENOUS

## 2019-06-26 MED ORDER — SODIUM CHLORIDE 0.9 % IV SOLN: 250.0000 mL | INTRAVENOUS | Status: DC | PRN

## 2019-06-26 MED ORDER — POLYETHYLENE GLYCOL 3350 17 G PO PACK: 17.0000 g | PACK | Freq: Every day | ORAL | Status: DC | PRN

## 2019-06-26 MED ORDER — BUPROPION HCL ER (XL) 150 MG PO TB24
150.0000 mg | ORAL_TABLET | Freq: Every day | ORAL | Status: DC
  Administered 2019-06-26 – 2019-07-12 (×17): 150 mg via ORAL
  Filled 2019-06-26 (×17): qty 1

## 2019-06-26 MED ORDER — MAGNESIUM SULFATE 4 GM/100ML IV SOLN
4.0000 g | Freq: Once | INTRAVENOUS | Status: AC
  Administered 2019-06-26: 4 g via INTRAVENOUS
  Filled 2019-06-26: qty 100

## 2019-06-26 MED ORDER — SACUBITRIL-VALSARTAN 24-26 MG PO TABS
1.0000 | ORAL_TABLET | Freq: Two times a day (BID) | ORAL | Status: DC
  Administered 2019-06-26 (×2): 1 via ORAL
  Filled 2019-06-26 (×3): qty 1

## 2019-06-26 MED ORDER — SODIUM CHLORIDE 0.9% FLUSH: 3.0000 mL | INTRAVENOUS | Status: DC | PRN

## 2019-06-26 MED ORDER — OXYCODONE-ACETAMINOPHEN 5-325 MG PO TABS
1.0000 | ORAL_TABLET | ORAL | Status: DC | PRN
  Administered 2019-06-26 – 2019-07-03 (×20): 1 via ORAL
  Filled 2019-06-26 (×21): qty 1

## 2019-06-26 MED ORDER — ASPIRIN 81 MG PO CHEW
81.0000 mg | CHEWABLE_TABLET | ORAL | Status: AC
  Administered 2019-06-27: 81 mg via ORAL
  Filled 2019-06-26: qty 1

## 2019-06-26 MED ORDER — SERTRALINE HCL 50 MG PO TABS
150.0000 mg | ORAL_TABLET | Freq: Every day | ORAL | Status: DC
  Administered 2019-06-26 – 2019-07-11 (×16): 150 mg via ORAL
  Filled 2019-06-26 (×16): qty 1

## 2019-06-26 MED ORDER — DOCUSATE SODIUM 100 MG PO CAPS
100.0000 mg | ORAL_CAPSULE | Freq: Every day | ORAL | Status: DC | PRN
  Administered 2019-07-12: 100 mg via ORAL
  Filled 2019-06-26: qty 1

## 2019-06-26 NOTE — Progress Notes (Signed)
ANTICOAGULATION CONSULT NOTE - Follow-Up Consult  Pharmacy Consult for Heparin Indication: Afib + new Impella  Assessment: 35 YOF who underwent aortobifemoral bypass on 4/13 and was noted to have post-op Afib with RVR and was initiated on Heparin IV. On 4/19 the patient went into respiratory distress requiring intubation, followed by a cardiac arrest. ECHO at the time showed a low EF of 10-15% and the patient was taken emergently to the OR for Impella placement. The patient is now s/p OR and pharmacy is monitor for anticoagulation with Heparin given the Impella 5.5 + Afib.  Systemic heparin was initiated 6 hours postop.   Heparin level this am 0.2 units/hr  Goal of Therapy:  Heparin level 0.2-0.5 units/ml Monitor platelets by anticoagulation protocol: Yes   Plan:  - Continue heparin at 700 units/hr - Will monitor by heparin levels (not ACT) per new protocol - Will check a heparin level in 6 hours for first 24 hours, then every 12 hours.   Thanks for allowing pharmacy to be a part of this patient's care.  Excell Seltzer, PharmD Clinical Pharmacist  06/26/2019, 2:51 AM

## 2019-06-26 NOTE — Progress Notes (Signed)
ANTICOAGULATION CONSULT NOTE - Follow-Up Consult  Pharmacy Consult for Heparin Indication: Afib + new Impella  Patient Measurements: Height: 5\' 1"  (154.9 cm) Weight: 78.3 kg (172 lb 9.9 oz) IBW/kg (Calculated) : 47.8  Ideal body weight: 47.8 kg (105 lb 6.1 oz) Adjusted ideal body weight: 60 kg (132 lb 4.4 oz)  Heparin Dosing Weight: 65 kg  Vital Signs: Temp: 99.7 F (37.6 C) (04/21 1800) Temp Source: Core (04/21 1200) BP: 130/62 (04/21 1800) Pulse Rate: 85 (04/21 1800)  Labs: Recent Labs    06/24/19 1458 06/24/19 1459 06/24/19 2315 06/25/19 0239 06/25/19 0442 06/25/19 0442 06/25/19 0800 06/25/19 0944 06/25/19 1130 06/26/19 0200 06/26/19 0417 06/26/19 0500 06/26/19 1308 06/26/19 1810  HGB 10.3*   < >  --    < > 10.0*   < >  --   --   --   --  10.0*  --   --  10.3*  HCT 34.1*   < >  --    < > 30.6*  --   --   --   --   --  31.1*  --   --  31.8*  PLT 301   < >  --   --  147*  --   --   --   --   --  153  --   --  157  LABPROT 20.8*  --   --   --   --   --   --   --   --   --   --   --   --   --   INR 1.8*  --   --   --   --   --   --   --   --   --   --   --   --   --   HEPARINUNFRC  --   --   --    < >  --   --   --   --    < > 0.20*  --  0.10*  --  0.14*  CREATININE 1.33*   < >  --   --  1.22*  --   --   --   --   --  1.16*  --  1.08*  --   CKTOTAL 86  --   --   --   --   --   --   --   --   --   --   --   --   --   CKMB 11.2*  --   --   --   --   --   --   --   --   --   --   --   --   --   TROPONINIHS 1,802*   < > 2,957*  --   --   --  2,749* 2,533*  --   --   --   --   --   --    < > = values in this interval not displayed.    Estimated Creatinine Clearance: 47.9 mL/min (A) (by C-G formula based on SCr of 1.08 mg/dL (H)).   Medications:  Infusions:  . sodium chloride Stopped (06/26/19 0859)  . sodium chloride    . [START ON 06/27/2019] sodium chloride    . amiodarone 30 mg/hr (06/26/19 1800)  . epinephrine Stopped (06/24/19 1719)  . impella catheter  heparin 50 unit/mL in dextrose 5%    .  heparin 800 Units/hr (06/26/19 1800)  . lactated ringers 20 mL/hr at 06/26/19 1800  . nitroGLYCERIN Stopped (06/25/19 1304)    Assessment: 36 YOF who underwent aortobifemoral bypass on 4/13 and was noted to have post-op Afib with RVR and was initiated on Heparin IV. On 4/19 the patient went into respiratory distress requiring intubation, followed by a cardiac arrest. ECHO at the time showed a low EF of 10-15% and the patient was taken emergently to the OR for Impella placement. The patient is now s/p OR and pharmacy is monitor for anticoagulation with Heparin given the Impella 5.5 + Afib.  Systemic heparin was initiated 6 hours postop.   Purge solution is providing 310 units/hr of heparin. Systemic heparin currently infusing at 800 units/hr. Total 1110 units/hr of heparin.  Heparin level is low at 0.14.  Per protocol, systemic heparin rate not to exceed 1000 units/hr or 10 units/kg/hr unless direct conversation with surgeon  Goal of Therapy:  Heparin level 0.2-0.5 units/ml Monitor platelets by anticoagulation protocol: Yes   Plan:  - Increase systemic heparin to 900 units/hr  = Expected total heparin rate of 1210 units/hr - Will monitor by heparin levels (not ACT) per new protocol - Will check a heparin level in 6 hours - Check heparin level every 12 hours  Thank you for allowing pharmacy to be a part of this patient's care.  Alanda Slim, PharmD, [facility] Clinical Pharmacist Please see AMION for all Pharmacists' Contact Phone Numbers 06/26/2019, 7:00 PM

## 2019-06-26 NOTE — Progress Notes (Addendum)
[facility]  Subjective  - Extubated, very tired.   Objective 117/65 83 98.6 F (37 C) (!) 21 98%  Intake/Output Summary (Last 24 hours) at 06/26/2019 0901 Last data filed at 06/26/2019 0800 Gross per 24 hour  Intake 3230.7 ml  Output 4800 ml  Net -1569.3 ml    Abdomin soft, incision healing well Palpable DP pulses B LE Dry guaze to right groin Left groin  Assessment/Planning: 69 year old female postop day 7 status post aortobifemoral bypass by Dr. Donzetta Matters.  Impella 5.5 placement 4/19 for cardiogenic shock.   Extubated Patent bypass with palpable pulse, Impella assistance for inflow Urine OP improved supported with Lasix, Cr improved 1.16 Amiodarone IV, off pressors.  Plan for Cardiac Cath tomorrow.    Roxy Horseman 06/26/2019 9:01 AM --  Laboratory Lab Results: Recent Labs    06/25/19 0442 06/26/19 0417  WBC 9.0 11.1*  HGB 10.0* 10.0*  HCT 30.6* 31.1*  PLT 147* 153   BMET Recent Labs    06/25/19 0442 06/26/19 0417  NA 148* 144  K 3.1* 2.9*  CL 116* 109  CO2 24 26  GLUCOSE 123* 134*  BUN 16 13  CREATININE 1.22* 1.16*  CALCIUM 8.1* 7.8*    COAG Lab Results  Component Value Date   INR 1.8 (H) 06/24/2019   INR 1.4 (H) 06/18/2019   INR 1.6 (H) 06/14/2019   No results found for: PTT  POD#8 s/p aortobifem.  Moved to ICU Monday with cardiogenic shock, PEA arrest, doing better today.   N: Following commands, extubated. Pulm: High flow nasal cannula. CV: Impella, LA cleared, color improved, Hgb stable 10, appreciate cardiology and CT GI: CLD Renal: UOP 20-40 mL/hr, Cr stable 1.16 FEN: LA cleared Doppler signals BLE, feet warm, color improved Serous drainage right groin, keep dry dressing  Overall looks much better. Appreciate cardiology, CT surgery, CCM.  Cath tomorrow via left brachial access with cardiology tomorrow.  Marty Heck, MD [facility] Office:  [employer]

## 2019-06-26 NOTE — Progress Notes (Signed)
Physical Therapy Treatment Patient Details Name: Wendy [name] MRN: [id number] DOB: 1950-09-07 Today's Date: 06/26/2019    History of Present Illness Pt is a 69yo female s/p aortobifemoral bypass due to critical R LE ischemia. PMHx: L to R femorofemoral bypass. PMHx: COPD, CAD, HTN, past MI, PVDx, cardiac stents, RT THA, neuropathy, HLD, OSA.  Patient with respiratory distress progressed to PEA arrest on 4/19 transferred to ICU, vented and coded with ROS within 6 minutes.  Now with Impella device placed 4/19, Swan-Ganz and flexiseal.    PT Comments    Patient with decline in status due to medical issues as noted above since last session.  Limited to in bed therex per RN/pt due to weakness, fatigue, diarrhea, etc.  Patient may need higher level of care at d/c, but needs mobility assessment prior to changing disposition.  PT to follow acutely.    Follow Up Recommendations  Home health PT;Supervision/Assistance - 24 hour     Equipment Recommendations  Other (comment)(TBA)    Recommendations for Other Services       Precautions / Restrictions Precautions Precautions: Fall Precaution Comments: restricted use R UE due to impella, Swan-Ganz on L    Mobility  Bed Mobility               General bed mobility comments: deferred per tp and RN due to feeling poorly and diarrhea  Transfers                    Ambulation/Gait                 Stairs             Wheelchair Mobility    Modified Rankin (Stroke Patients Only)       Balance                                            Cognition Arousal/Alertness: Awake/alert Behavior During Therapy: Flat affect Overall Cognitive Status: No family/caregiver present to determine baseline cognitive functioning                                 General Comments: follows commands with little extra time, limited focus due to general discomfort      Exercises General Exercises  - Upper Extremity Shoulder Flexion: AAROM;5 reps;Both;Supine Elbow Flexion: AROM;5 reps;Supine;Both Elbow Extension: AROM;5 reps;Supine;Both Digit Composite Flexion: AROM;Both;5 reps;Supine Composite Extension: AROM;Both;Supine;5 reps General Exercises - Lower Extremity Ankle Circles/Pumps: AROM;10 reps;Supine;Both Short Arc Quad: AROM;10 reps;Supine;Both Heel Slides: AAROM;5 reps;Supine;Both Hip ABduction/ADduction: AAROM;5 reps;Supine;Both    General Comments General comments (skin integrity, edema, etc.): VSS with in bed therex      Pertinent Vitals/Pain Pain Assessment: Faces Faces Pain Scale: Hurts even more Pain Location: generalized Pain Descriptors / Indicators: Grimacing;Aching Pain Intervention(s): Monitored during session;Limited activity within patient's tolerance    Home Living                      Prior Function            PT Goals (current goals can now be found in the care plan section) Progress towards PT goals: PT to reassess next treatment    Frequency    Min 3X/week      PT Plan Current plan remains appropriate  Co-evaluation              AM-PAC PT "6 Clicks" Mobility   Outcome Measure  Help needed turning from your back to your side while in a flat bed without using bedrails?: Total Help needed moving from lying on your back to sitting on the side of a flat bed without using bedrails?: Total Help needed moving to and from a bed to a chair (including a wheelchair)?: Total Help needed standing up from a chair using your arms (e.g., wheelchair or bedside chair)?: Total Help needed to walk in hospital room?: Total Help needed climbing 3-5 steps with a railing? : Total 6 Click Score: 6    End of Session   Activity Tolerance: Patient tolerated treatment well Patient left: in bed;with call bell/phone within reach   PT Visit Diagnosis: Other abnormalities of gait and mobility (R26.89);Muscle weakness (generalized) (M62.81)      Time: [id number] PT Time Calculation (min) (ACUTE ONLY): 17 min  Charges:  $Therapeutic Exercise: 8-22 mins                     Wendy [name], [name] [phone] 06/26/2019    Wendy [name] 06/26/2019, 12:57 PM

## 2019-06-26 NOTE — Progress Notes (Signed)
ANTICOAGULATION CONSULT NOTE - Follow-Up Consult  Pharmacy Consult for Heparin Indication: Afib + new Impella  Patient Measurements: Height: 5\' 1"  (154.9 cm) Weight: 78.3 kg (172 lb 9.9 oz) IBW/kg (Calculated) : 47.8  Ideal body weight: 47.8 kg (105 lb 6.1 oz) Adjusted ideal body weight: 60 kg (132 lb 4.4 oz)  Heparin Dosing Weight: 65 kg  Vital Signs: Temp: 98.6 F (37 C) (04/21 1100) Temp Source: Core (04/21 0800) BP: 114/78 (04/21 1100) Pulse Rate: 88 (04/21 1100)  Labs: Recent Labs    06/24/19 1458 06/24/19 1459 06/24/19 2049 06/24/19 2223 06/24/19 2315 06/25/19 0239 06/25/19 0423 06/25/19 0423 06/25/19 0442 06/25/19 0800 06/25/19 0944 06/25/19 1130 06/25/19 1835 06/26/19 0200 06/26/19 0417 06/26/19 0500  HGB 10.3*   < >  --    < >  --   --  9.2*   < > 10.0*  --   --   --   --   --  10.0*  --   HCT 34.1*   < >  --    < >  --   --  27.0*  --  30.6*  --   --   --   --   --  31.1*  --   PLT 301  --   --   --   --   --   --   --  147*  --   --   --   --   --  153  --   LABPROT 20.8*  --   --   --   --   --   --   --   --   --   --   --   --   --   --   --   INR 1.8*  --   --   --   --   --   --   --   --   --   --   --   --   --   --   --   HEPARINUNFRC  --   --   --   --   --    < >  --   --   --   --   --    < > 0.10* 0.20*  --  0.10*  CREATININE 1.33*   < > 1.13*  --   --   --   --   --  1.22*  --   --   --   --   --  1.16*  --   CKTOTAL 86  --   --   --   --   --   --   --   --   --   --   --   --   --   --   --   CKMB 11.2*  --   --   --   --   --   --   --   --   --   --   --   --   --   --   --   TROPONINIHS 1,802*   < >  --   --  2,957*  --   --   --   --  2,749* 2,533*  --   --   --   --   --    < > = values in this interval not displayed.    Estimated Creatinine Clearance: 44.6 mL/min (  A) (by C-G formula based on SCr of 1.16 mg/dL (H)).   Medications:  Infusions:  . sodium chloride Stopped (06/26/19 0859)  . [START ON 06/27/2019] sodium chloride     . amiodarone 30 mg/hr (06/26/19 1100)  . epinephrine Stopped (06/24/19 1719)  . impella catheter heparin 50 unit/mL in dextrose 5%    . heparin 700 Units/hr (06/26/19 1100)  . lactated ringers 20 mL/hr at 06/26/19 1100  . nitroGLYCERIN Stopped (06/25/19 1304)    Assessment: 65 YOF who underwent aortobifemoral bypass on 4/13 and was noted to have post-op Afib with RVR and was initiated on Heparin IV. On 4/19 the patient went into respiratory distress requiring intubation, followed by a cardiac arrest. ECHO at the time showed a low EF of 10-15% and the patient was taken emergently to the OR for Impella placement. The patient is now s/p OR and pharmacy is monitor for anticoagulation with Heparin given the Impella 5.5 + Afib.  Systemic heparin was initiated 6 hours postop.   Purge solution is providing 310 units/hr of heparin. Systemic heparin currently infusing at 700 units/hr. Total 1010 units/hr of heparin (15.5 units/kg/hr).  Heparin level is low at 0.1.  Per protocol, systemic heparin rate not to exceed 1000 units/hr or 10 units/kg/hr unless direct conversation with surgeon  Goal of Therapy:  Heparin level 0.2-0.5 units/ml Monitor platelets by anticoagulation protocol: Yes   Plan:  - Increase systemic heparin to 800 units/hr  = Expected total heparin rate of 1110 units/hr (~17 units/kg/hr) - Will monitor by heparin levels (not ACT) per new protocol - Will check a heparin level in 6 hours - Check heparin level every 12 hours  Thank you for allowing pharmacy to be a part of this patient's care.  Vertis Kelch, PharmD, [facility] PGY2 Cardiology Pharmacy Resident Phone [phone] 06/26/2019       11:52 AM  Please check [email or web address] for unit-specific pharmacist phone numbers

## 2019-06-26 NOTE — Progress Notes (Signed)
NAME:  Wendy [name], MRN:  [id number], DOB:  1951-01-14, LOS: 8 ADMISSION DATE:  06/18/2019, CONSULTATION DATE:  06/24/2019 REFERRING MD:  Dr. Carlis Abbott CHIEF COMPLAINT:  Hypoxia   Brief History   Ms. Wendy [name] is a 69 y/o female with a PMHx of CAD, HFpEF, severe PAD who presents to the hospital for a scheduled aortobifemoral bypass. Her admission has been complicated by acute hypoxic respiratory failure today that was preceded by diaphoresis, AMS, and tachycardia. She subsequently went into A. Fib with RVR and received intubation for airway protection. PCCM was consulted for ventilation management.   Past Medical History   Past Medical History:  Diagnosis Date  . Anemia   . Anginal pain ([place])    jaw pain 07/2016, s/p 07/15/16 nuclear stress test  . Anxiety   . Arthritis    "lower back" (08/24/2017)  . Asthma   . CAD (coronary artery disease), native coronary artery    a. Hx CABG 1998, last PCI 2013 b.LHC 07/2013:  EF 55-60%, L-LAD prob atretic, no sig disease in LAD, S-OM1 ok with patent stent, S-dRCA ok => med Rx. c. LHC 01/2015 DES to SVG-RCA.  Marland Kitchen Chronic back pain   . Chronic leg pain   . Chronic lower back pain   . COPD (chronic obstructive pulmonary disease) ([place])   . Depression   . DVT (deep venous thrombosis) ([facility]) X 3   RLE  . Fall 08/2016  . GERD (gastroesophageal reflux disease)   . Glucose intolerance (impaired glucose tolerance)   . Headache    "a few/month" (08/24/2017)  . HLD (hyperlipidemia)   . Hypertension   . Myocardial infarction ([place]) 1999  . OSA on CPAP   . PAD (peripheral artery disease) ([place])   . PAF (paroxysmal atrial fibrillation) ([place])    a. 09/2013 post-op b. 01/2015  . Peripheral neuropathy   . Pneumonia   . Shortness of breath    when walking  . Wears glasses    Significant Hospital Events   4/13: Admitted 4/14: A. Fib with RVR 4/19: PEA cardiac arrest with ROSC  Consults:  Vascular Surgery, Cardiology, Nephrology, Advanced Heart  Failure, TCTS  Procedures:  4/13: Aorto-bifemoral bypass and reimplantation of right profunda femoris artery on aortobifemoral limb by Dr. Donzetta Matters  4/19: Placement of Impella LVAD by Dr. Orvan Seen   Significant Diagnostic Tests:   Echo 06/17/19 1. Left ventricular ejection fraction, by estimation, is 55 to 60%. The  left ventricle has normal function. The left ventricle demonstrates  regional wall motion abnormalities (see scoring diagram/findings for  description). Left ventricular diastolic  parameters were normal.  2. Right ventricular systolic function is low normal. The right  ventricular size is normal. There is moderately elevated pulmonary artery  systolic pressure.  3. The mitral valve is normal in structure. Mild mitral valve  regurgitation.  4. The aortic valve is normal in structure. Aortic valve regurgitation is  not visualized.  5. The inferior vena cava is normal in size with greater than 50%  respiratory variability, suggesting right atrial pressure of 3 mmHg.  Echo 06/24/19 1. Left ventricular ejection fraction, by estimation, is 20 to 25%. The  left ventricle has severely decreased function. The left ventricle  demonstrates global hypokinesis. Left ventricular diastolic parameters are  consistent with Grade III diastolic  dysfunction (restrictive).  2. Right ventricular systolic function is normal. The right ventricular  size is normal.  3. The mitral valve is normal in structure. Mild mitral valve  regurgitation.  No evidence of mitral stenosis.  4. The aortic valve is normal in structure. Aortic valve regurgitation is  not visualized. No aortic stenosis is present.  5. The inferior vena cava is normal in size with greater than 50%  respiratory variability, suggesting right atrial pressure of 3 mmHg.  CXR 06/25/19 Stable support apparatus. Slightly improved bilateral lung opacities are noted suggesting improving edema.  Micro Data:   N/A  Antimicrobials:  N/A  Interim history/subjective:   Patient is breathing well this AM on 2L without decreases in O2 saturation. Overnight, she was having some increased epigastric abdominal pain. She has increased bowel movements that are non-bloody, non-melanotic but slightly mucus-y.   Her RN notes patient has seemed increasing depressed and unmotivated, as not wanted to eat. She is no longer on sedatives, received one time dose of Xanax early last night.   Objective   Blood pressure 123/68, pulse 91, temperature 99.1 F (37.3 C), resp. rate 19, height 5\' 1"  (1.549 m), weight 78.3 kg, SpO2 100 % on 2L [place]  Intake/Output Summary (Last 24 hours) at 06/26/2019 0731 Last data filed at 06/26/2019 0700 Gross per 24 hour  Intake 3298.26 ml  Output 4910 ml  Net -1611.74 ml   Filed Weights   06/24/19 0529 06/25/19 0430 06/26/19 0615  Weight: 75.9 kg 79.8 kg 78.3 kg    Physical Exam Vitals and nursing note reviewed.  Constitutional:      General: She is not in acute distress.    Appearance: She is obese. She is ill-appearing.  Cardiovascular:     Rate and Rhythm: Normal rate and regular rhythm.     Comments: Impella sound present Pulmonary:     Effort: Pulmonary effort is normal. No respiratory distress.     Breath sounds: No wheezing, rhonchi or rales.  Abdominal:     General: Bowel sounds are normal.     Palpations: Abdomen is soft.  Musculoskeletal:     Right lower leg: Edema (trace) present.     Left lower leg: Edema (trace) present.  Skin:    General: Skin is warm and dry.  Neurological:     General: No focal deficit present.     Mental Status: She is alert and oriented to person, place, and time.    Resolved Hospital Problem list   None   Assessment & Plan:   # Acute Hypoxic Respiratory Failure  Secondary to a. Fib with RVR and subsequent cardiogenic shock with subsequent pulmonary edema. Successfully extubated yesterday and currently on 2L nasal cannula  saturating >65%. Chest Xray shows improvement compared to yesterday.  - Continue supplemental oxygen as needed to maintain saturation above >92% - Wean as tolerated  # Cardiogenic Shock # s/p PEA Arrest   Hx of severe triple vessel CAS with onset of A fib with RVR today that likely led to an ischemic event as noted on TTE (4/19) that showed severe left ventricular wall motion abnormality. Advanced HF was contacted who has discussed case with TCTS.Patient was taken to the OR for Impella placement. This AM, patient was off pressor support.   # A. Fib with RVR - Amiodarone gtt - Entresto  # Acute HFrEF  # CAD Concerning for ischemic event yesterday although troponin only elevated to the low 2000s with no concerning ST changes on EKG. Currently being stabilized with Impella. Current plan for angiography this week.   - Management per AHF  # PAD S/p aortobifemoral bypass on 4/13.   - Management per Vascular  surgery   # Hypomagnesemia # Hypokalemia  - Replenish PRN - Monitor daily  # Acute Kidney Injury  Creatinine continues to improve today. Adequate urine output   - Trend creatinine - Monitor urine output.   # Thrombocytopenia  Resolved today although LDH has risen mildly. Likely due to Impella placement.  - Continue to trend platelets.   # Chronic Normocytic Anemia  Stable and unchanged.  # Diarrhea - Will hold off on daily bowel regimen and change to PRN.   Best practice:  Diet: Clears Pain/Anxiety/Delirium protocol (if indicated): Ordered VAP protocol (if indicated): N/A DVT prophylaxis: Heparin GI prophylaxis: Protonix  Glucose control: Not indicated Mobility: Bedrest  Code Status: FULL Family Communication: Sister updated via telephone by Dr. Carlis Abbott and Dr. Benjamine Mola yesterday Disposition: ICU  Labs   CBC: Recent Labs  Lab 06/22/19 2243 06/22/19 2243 06/24/19 0320 06/24/19 0320 06/24/19 1458 06/24/19 1459 06/24/19 2024 06/24/19 2223 06/25/19 0423  06/25/19 0442 06/26/19 0417  WBC 5.6  --  5.0  --  11.9*  --   --   --   --  9.0 11.1*  NEUTROABS  --   --   --   --  7.9*  --   --   --   --   --   --   HGB 9.3*   < > 10.3*   < > 10.3*   < > 9.2* 9.5* 9.2* 10.0* 10.0*  HCT 29.8*   < > 32.9*   < > 34.1*   < > 27.0* 28.0* 27.0* 30.6* 31.1*  MCV 93.4  --  92.2  --  96.9  --   --   --   --  91.1 89.4  PLT 141*  --  162  --  301  --   --   --   --  147* 153   < > = values in this interval not displayed.    Basic Metabolic Panel: Recent Labs  Lab 06/20/19 1322 06/20/19 1322 06/21/19 0508 06/21/19 0508 06/22/19 0341 06/22/19 1616 06/23/19 0448 06/23/19 0448 06/24/19 0320 06/24/19 0320 06/24/19 1458 06/24/19 1459 06/24/19 2049 06/24/19 2223 06/24/19 2315 06/25/19 0423 06/25/19 0442 06/26/19 0417  NA 144   < > 144   < > 145   < > 144   < > 146*   < > 144   < > 147* 149*  --  148* 148* 144  K 4.5   < > 4.7   < > 4.3   < > 4.1   < > 4.0   < > 5.2*   < > 3.0* 2.9*  --  3.0* 3.1* 2.9*  CL 112*   < > 116*   < > 118*   < > 117*   < > 114*  --  116*  --  114*  --   --   --  116* 109  CO2 21*   < > 18*   < > 19*   < > 19*   < > 20*  --  12*  --  20*  --   --   --  24 26  GLUCOSE 120*   < > 91   < > 93   < > 98   < > 113*  --  246*  --  248*  --   --   --  123* 134*  BUN 31*   < > 29*   < > 25*   < > 19   < >  15  --  15  --  15  --   --   --  16 13  CREATININE 2.81*   < > 2.46*   < > 2.14*   < > 1.77*   < > 1.26*  --  1.33*  --  1.13*  --   --   --  1.22* 1.16*  CALCIUM 8.1*   < > 8.3*   < > 8.1*   < > 8.3*   < > 8.5*  --  7.5*  --  8.2*  --   --   --  8.1* 7.8*  MG 1.8  --   --   --   --   --   --   --   --   --   --   --   --   --  1.8  --  1.7 1.6*  PHOS 4.9*  --  4.9*  --  4.1  --  3.8  --  3.0  --   --   --   --   --   --   --   --   --    < > = values in this interval not displayed.   GFR: Estimated Creatinine Clearance: 44.6 mL/min (A) (by C-G formula based on SCr of 1.16 mg/dL (H)). Recent Labs  Lab 06/24/19 0320  06/24/19 1449 06/24/19 1458 06/24/19 2049 06/24/19 2315 06/25/19 0442 06/26/19 0417  WBC 5.0  --  11.9*  --   --  9.0 11.1*  LATICACIDVEN  --  8.3*  --  4.4* 3.3* 1.8  --     Liver Function Tests: Recent Labs  Lab 06/23/19 0448 06/24/19 0320 06/24/19 1458 06/25/19 0442 06/26/19 0417  AST  --   --  52* 79* 28  ALT  --   --  20 40 31  ALKPHOS  --   --  99 74 76  BILITOT  --   --  0.8 1.0 1.0  PROT  --   --  4.7* 4.2* 4.6*  ALBUMIN 2.0* 2.2* 1.8* 2.2* 2.2*   No results for input(s): LIPASE, AMYLASE in the last 168 hours. No results for input(s): AMMONIA in the last 168 hours.  ABG    Component Value Date/Time   PHART 7.461 (H) 06/25/2019 0423   PCO2ART 34.3 06/25/2019 0423   PO2ART 180.0 (H) 06/25/2019 0423   HCO3 24.4 06/25/2019 0423   TCO2 25 06/25/2019 0423   ACIDBASEDEF 2.0 06/24/2019 2223   O2SAT 77.5 06/26/2019 0356     Coagulation Profile: Recent Labs  Lab 06/24/19 1458  INR 1.8*    Cardiac Enzymes: Recent Labs  Lab 06/24/19 1458  CKTOTAL 86  CKMB 11.2*    HbA1C: Hgb A1c MFr Bld  Date/Time Value Ref Range Status  06/24/2019 11:18 PM 5.4 4.8 - 5.6 % Final    Comment:    (NOTE) Pre diabetes:          5.7%-6.4% Diabetes:              >6.4% Glycemic control for   <7.0% adults with diabetes   08/15/2016 04:39 PM 4.9 4.8 - 5.6 % Final    Comment:    (NOTE)         Pre-diabetes: 5.7 - 6.4         Diabetes: >6.4         Glycemic control for adults with diabetes: <7.0     CBG: Recent Labs  Lab 06/25/19  1105 06/25/19 1529 06/25/19 2058 06/26/19 0034 06/26/19 0347  GLUCAP 136* 124* 114* 112* 126*    Review of Systems:   Unable to obtain   Past Medical History  She,  has a past medical history of Anemia, Anginal pain ([place]), Anxiety, Arthritis, Asthma, CAD (coronary artery disease), native coronary artery, Chronic back pain, Chronic leg pain, Chronic lower back pain, COPD (chronic obstructive pulmonary disease) ([place]), Depression, DVT  (deep venous thrombosis) ([facility]) (X 3), Fall (08/2016), GERD (gastroesophageal reflux disease), Glucose intolerance (impaired glucose tolerance), Headache, HLD (hyperlipidemia), Hypertension, Myocardial infarction ([place]) (1999), OSA on CPAP, PAD (peripheral artery disease) ([place]), PAF (paroxysmal atrial fibrillation) ([place]), Peripheral neuropathy, Pneumonia, Shortness of breath, and Wears glasses.   Surgical History    Past Surgical History:  Procedure Laterality Date  . ABDOMINAL AORTOGRAM W/LOWER EXTREMITY Bilateral 04/29/2019   Procedure: ABDOMINAL AORTOGRAM W/LOWER EXTREMITY;  Surgeon: Waynetta Sandy, MD;  Location: [facility];  Service: Cardiovascular;  Laterality: Bilateral;  . ANTERIOR CERVICAL DECOMP/DISCECTOMY FUSION  1991  . AORTA - BILATERAL FEMORAL ARTERY BYPASS GRAFT N/A 06/18/2019   Procedure: AORTA BIFEMORAL BYPASS GRAFT;  Surgeon: Waynetta Sandy, MD;  Location: [facility];  Service: Vascular;  Laterality: N/A;  . BACK SURGERY    . CARDIAC CATHETERIZATION N/A 01/22/2015   Procedure: Left Heart Cath and Coronary Angiography;  Surgeon: Troy Sine, MD;  Location: [facility];  Service: Cardiovascular;  Laterality: N/A;  . CARDIAC CATHETERIZATION N/A 01/22/2015   Procedure: Coronary Stent Intervention;  Surgeon: Troy Sine, MD;  Location: [facility];  Service: Cardiovascular;  Laterality: N/A;  3.0x12 xience prox SVG to RCA  . CARDIAC CATHETERIZATION  03/09/2000   hx/notes 03/20/2000  . CHOLECYSTECTOMY N/A 09/16/2013   Procedure: LAPAROSCOPIC CHOLECYSTECTOMY CONVERTED TO OPEN CHOLECYSTECTOMY WITH CHOLANGIOGRAM;  Surgeon: [name], MD;  Location: [facility];  Service: General;  Laterality: N/A;  . CORONARY ANGIOPLASTY WITH STENT PLACEMENT  11/2011, 02/2012, 01/2015   DES to SVG-RCA both times, In [place], [place], Dr. Bruce Donath  . CORONARY ARTERY BYPASS GRAFT  1998   LIMA-LAD, SVG-OM, SVG-RCA  . ERCP N/A 09/19/2013   Procedure: ENDOSCOPIC  RETROGRADE CHOLANGIOPANCREATOGRAPHY (ERCP);  Surgeon: [name], MD;  Location: [facility];  Service: Endoscopy;  Laterality: N/A;  . FINGER SURGERY Right    ring finger "fused"  . FRACTURE SURGERY    . IRRIGATION AND DEBRIDEMENT ABSCESS Right 10/17/2013   Procedure: INCISION AND DRAINAGE RIGHT SUBCOSTAL WOUND;  Surgeon: [name], MD;  Location: [facility];  Service: General;  Laterality: Right;  . JOINT REPLACEMENT    . KNEE ARTHROSCOPY Right 1998  . LEFT HEART CATH AND CORS/GRAFTS ANGIOGRAPHY N/A 07/25/2016   Procedure: Left Heart Cath and Cors/Grafts Angiography;  Surgeon: Jettie Booze, MD;  Location: [facility];  Service: Cardiovascular;  Laterality: N/A;  . LEFT HEART CATH AND CORS/GRAFTS ANGIOGRAPHY N/A 06/05/2019   Procedure: LEFT HEART CATH AND CORS/GRAFTS ANGIOGRAPHY;  Surgeon: Burnell Blanks, MD;  Location: [facility];  Service: Cardiovascular;  Laterality: N/A;  . LEFT HEART CATHETERIZATION WITH CORONARY/GRAFT ANGIOGRAM N/A 07/19/2013   Procedure: LEFT HEART CATHETERIZATION WITH Beatrix Fetters;  Surgeon: Leonie Man, MD;  Location: [facility];  Service: Cardiovascular;  Laterality: N/A;  . MASS EXCISION Right 06/25/2014   Procedure: EXCISION BUTTOCK MASS ;  Surgeon: Coralie Keens, MD;  Location: [facility];  Service: General;  Laterality: Right;  . ORIF HUMERUS FRACTURE  Left 08/27/2013   Procedure: OPEN REDUCTION INTERNAL FIXATION (ORIF) LEFT HUMERUS ;  Surgeon: Rozanna Box, MD;  Location: [facility];  Service: Orthopedics;  Laterality: Left;  . POSTERIOR FUSION CERVICAL SPINE  1992  . THROMBECTOMY / EMBOLECTOMY FEMORAL ARTERY Right    hx/notes 03/20/2000  . TOTAL HIP ARTHROPLASTY Right 07/19/2016   Procedure: TOTAL HIP ARTHROPLASTY ANTERIOR APPROACH;  Surgeon: [name], MD;  Location: [facility];  Service: Orthopedics;  Laterality: Right;  . TOTAL HIP REVISION Right 08/22/2017   Procedure: TOTAL HIP REVISION;   Surgeon: [name], MD;  Location: [facility];  Service: Orthopedics;  Laterality: Right;     Social History   reports that she quit smoking about 20 years ago. Her smoking use included cigarettes. She has a 17.00 pack-year smoking history. She has never used smokeless tobacco. She reports previous alcohol use. She reports current drug use. Drug: Marijuana.   Family History   Her family history includes Clotting disorder in her father; Heart disease in her brother and brother; Lung cancer in her mother and sister. There is no history of Colon cancer or Breast cancer.   Allergies Allergies  Allergen Reactions  . Benadryl [Diphenhydramine] Shortness Of Breath  . Penicillins Diarrhea    Did it involve swelling of the face/tongue/throat, SOB, or low BP? No Did it involve sudden or severe rash/hives, skin peeling, or any reaction on the inside of your mouth or nose? No Did you need to seek medical attention at a hospital or doctor's office? No When did it last happen?2 months If all above answers are "NO", may proceed with cephalosporin use.  . Adhesive [Tape] Other (See Comments)    Burning of skin  . Amoxicillin Diarrhea and Other (See Comments)    Has patient had a PCN reaction causing immediate rash, facial/tongue/throat swelling, SOB or lightheadedness with hypotension: No Has patient had a PCN reaction causing severe rash involving mucus membranes or skin necrosis: No Has patient had a PCN reaction that required hospitalization No Has patient had a PCN reaction occurring within the last 10 years: Yes -- noted rxn as diarrhea If all of the above answers are "NO", then may proceed with Cephalosporin use.   . Cyclobenzaprine Other (See Comments)    Causes restless leg syndrome Restless syndrome  . Hydrocodone Itching     Home Medications  Prior to Admission medications   Medication Sig Start Date End Date Taking? Authorizing Provider  acetaminophen (TYLENOL) 500 MG tablet  Take 500-1,000 mg by mouth every 6 (six) hours as needed (for pain.).   Yes [provider]  albuterol (PROVENTIL HFA;VENTOLIN HFA) 108 (90 Base) MCG/ACT inhaler Inhale 2 puffs into the lungs every 6 (six) hours as needed for wheezing or shortness of breath. 01/01/18  Yes Bhagat, Bhavinkumar, PA  ALPRAZolam (XANAX) 0.5 MG tablet Take 0.5 mg by mouth 3 (three) times daily as needed for anxiety.   Yes [provider]  apixaban (ELIQUIS) 5 MG TABS tablet Take 1 tablet (5 mg total) by mouth 2 (two) times daily. 11/20/18  Yes Nahser, Wonda Cheng, MD  atorvastatin (LIPITOR) 80 MG tablet TAKE ONE TABLET BY MOUTH DAILY AT 6PM Patient taking differently: Take 80 mg by mouth daily at 6 PM. TAKE ONE TABLET BY MOUTH DAILY AT 6PM 11/20/18  Yes Nahser, Wonda Cheng, MD  buPROPion (WELLBUTRIN XL) 150 MG 24 hr tablet Take 150 mg by mouth daily.   Yes [provider]  ciprofloxacin (CIPRO) 500  MG tablet Take 1 tablet (500 mg total) by mouth 2 (two) times daily. 06/14/19  Yes Waynetta Sandy, MD  clopidogrel (PLAVIX) 75 MG tablet Take 75 mg by mouth daily.   Yes [provider]  diclofenac Sodium (VOLTAREN) 1 % GEL Apply 1 g topically 4 (four) times daily as needed for pain. 02/15/19  Yes [provider]  ezetimibe (ZETIA) 10 MG tablet Take 10 mg by mouth daily.   Yes [provider]  gabapentin (NEURONTIN) 300 MG capsule Take 300 mg by mouth 3 (three) times daily as needed for pain. 02/15/19  Yes [provider]  isosorbide mononitrate (IMDUR) 60 MG 24 hr tablet Take 1.5 tablets (90 mg total) by mouth every morning. Patient taking differently: Take 60 mg by mouth daily.  02/15/19  Yes Nahser, Wonda Cheng, MD  metoprolol tartrate (LOPRESSOR) 50 MG tablet Take 1 tablet (50 mg total) by mouth 2 (two) times daily. 11/20/18  Yes Nahser, Wonda Cheng, MD  nitroGLYCERIN (NITROSTAT) 0.4 MG SL tablet Place 1 tablet (0.4 mg total) under the tongue every 5 (five) minutes as  needed for chest pain. 11/20/18  Yes Nahser, Wonda Cheng, MD  Oxycodone HCl 10 MG TABS Take 10 mg by mouth 3 (three) times daily. 04/11/19  Yes [provider]  pantoprazole (PROTONIX) 40 MG tablet Take 1 tablet (40 mg total) by mouth daily. 11/20/18  Yes Nahser, Wonda Cheng, MD  pramipexole (MIRAPEX) 0.25 MG tablet Take 0.25 mg by mouth at bedtime.  02/18/19  Yes [provider]  ranolazine (RANEXA) 1000 MG SR tablet Take 1,000 mg by mouth 2 (two) times daily.   Yes [provider]  sertraline (ZOLOFT) 100 MG tablet Take 150 mg by mouth at bedtime.    Yes [provider]  valsartan-hydrochlorothiazide (DIOVAN-HCT) 160-12.5 MG tablet Take 1 tablet by mouth daily.  03/14/19  Yes [provider]  furosemide (LASIX) 20 MG tablet Take 1 tablet (20 mg total) by mouth daily as needed. Patient taking differently: Take 20 mg by mouth daily as needed for fluid.  11/20/18 11/20/19  Nahser, Wonda Cheng, MD    Dr. Jose Persia Internal Medicine PGY-1  Pager: [phone] 06/26/2019, 7:31 AM

## 2019-06-26 NOTE — Progress Notes (Signed)
Echocardiogram 2D Echocardiogram has been performed.  Wendy [name] 06/26/2019, 8:13 AM   Dr. Aundra Dubin present for images

## 2019-06-26 NOTE — Progress Notes (Addendum)
Patient ID: Wendy [name], female   DOB: 13-Feb-1951, 69 y.o.   MRN: [id number]     Advanced Heart Failure Rounding Note  PCP-Cardiologist: [name], MD   Subjective:    - 4/13 aortobifemoral bypass - Impella 5.5 placement 4/19 for cardiogenic shock.  - Extubated 4/20  She is stable hemodynamically this morning.  She remains on nicardipine gtt at 2.5, amiodarone 60. She got 40 mg IV Lasix yesterday with good response, I/Os negative.   Rhythm ?NSR.   She reports shallow breathing this morning.  Having mid-abdominal discomfort and diarrhea.   Swan numbers: CVP 8 PA 36/13 CI 3.6 Co-ox 78%  Impella: P7, flow 4.2 L/min.  No alarms. LDH 449  Objective:   Weight Range: 78.3 kg Body mass index is 32.62 kg/m.   Vital Signs:   Temp:  [98.1 F (36.7 C)-99.7 F (37.6 C)] 99.1 F (37.3 C) (04/21 0700) Pulse Rate:  [29-111] 91 (04/21 0700) Resp:  [0-29] 19 (04/21 0700) BP: (86-140)/(64-107) 123/68 (04/21 0700) SpO2:  [95 %-100 %] 100 % (04/21 0700) FiO2 (%):  [40 %] 40 % (04/20 0815) Weight:  [78.3 kg] 78.3 kg (04/21 0615) Last BM Date: 06/25/19  Weight change: Filed Weights   06/24/19 0529 06/25/19 0430 06/26/19 0615  Weight: 75.9 kg 79.8 kg 78.3 kg    Intake/Output:   Intake/Output Summary (Last 24 hours) at 06/26/2019 0732 Last data filed at 06/26/2019 0700 Gross per 24 hour  Intake 3298.26 ml  Output 4910 ml  Net -1611.74 ml      Physical Exam    General: NAD Neck: JVP 8 cm, no thyromegaly or thyroid nodule.  Lungs: Clear to auscultation bilaterally with normal respiratory effort. CV: Nondisplaced PMI.  Heart regular S1/S2, no S3/S4, no murmur. Impella sounds.  No peripheral edema.  No carotid bruit.  Dopplerable pedal pulses. Unable to palpate  Abdomen: Soft, nontender, no hepatosplenomegaly, no distention.  Skin: Intact without lesions or rashes.  Neurologic: Alert and oriented x 3.  Psych: Normal affect. Extremities: No clubbing or cyanosis.    HEENT: Normal.    Telemetry   HR 90s, ?NSR (personally reviewed)  Labs    CBC Recent Labs    06/24/19 1458 06/24/19 1459 06/25/19 0442 06/26/19 0417  WBC 11.9*   < > 9.0 11.1*  NEUTROABS 7.9*  --   --   --   HGB 10.3*   < > 10.0* 10.0*  HCT 34.1*   < > 30.6* 31.1*  MCV 96.9   < > 91.1 89.4  PLT 301   < > 147* 153   < > = values in this interval not displayed.   Basic Metabolic Panel Recent Labs    06/24/19 0320 06/24/19 1458 06/25/19 0442 06/26/19 0417  NA 146*   < > 148* 144  K 4.0   < > 3.1* 2.9*  CL 114*   < > 116* 109  CO2 20*   < > 24 26  GLUCOSE 113*   < > 123* 134*  BUN 15   < > 16 13  CREATININE 1.26*   < > 1.22* 1.16*  CALCIUM 8.5*   < > 8.1* 7.8*  MG  --    < > 1.7 1.6*  PHOS 3.0  --   --   --    < > = values in this interval not displayed.   Liver Function Tests Recent Labs    06/25/19 0442 06/26/19 0417  AST 79* 28  ALT 40  31  ALKPHOS 74 76  BILITOT 1.0 1.0  PROT 4.2* 4.6*  ALBUMIN 2.2* 2.2*   No results for input(s): LIPASE, AMYLASE in the last 72 hours. Cardiac Enzymes Recent Labs    06/24/19 1458  CKTOTAL 86  CKMB 11.2*    BNP: BNP (last 3 results) Recent Labs    06/19/19 1239  BNP 366.7*    ProBNP (last 3 results) No results for input(s): PROBNP in the last 8760 hours.   D-Dimer No results for input(s): DDIMER in the last 72 hours. Hemoglobin A1C Recent Labs    06/24/19 2318  HGBA1C 5.4   Fasting Lipid Panel No results for input(s): CHOL, HDL, LDLCALC, TRIG, CHOLHDL, LDLDIRECT in the last 72 hours. Thyroid Function Tests No results for input(s): TSH, T4TOTAL, T3FREE, THYROIDAB in the last 72 hours.  Invalid input(s): FREET3  Other results:   Imaging    DG CHEST PORT 1 VIEW  Result Date: 06/25/2019 CLINICAL DATA:  Congestive heart failure. EXAM: PORTABLE CHEST 1 VIEW COMPARISON:  June 24, 2019. FINDINGS: The heart size and mediastinal contours are within normal limits. Endotracheal and nasogastric  tubes are unchanged in position. Impella device is unchanged in position. Left internal jugular Swan-Ganz catheter is noted with tip directed into right pulmonary artery. Status post coronary bypass graft. No pneumothorax or significant pleural effusion is noted. Slightly improved bilateral lung opacities are noted suggesting improving edema. The visualized skeletal structures are unremarkable. IMPRESSION: Stable support apparatus. Slightly improved bilateral lung opacities are noted suggesting improving edema. Electronically Signed   By: Marijo Conception M.D.   On: 06/25/2019 09:58   ECHOCARDIOGRAM LIMITED  Result Date: 06/25/2019    ECHOCARDIOGRAM LIMITED REPORT   Patient Name:   Wendy [name] Date of Exam: 06/25/2019 Medical Rec #:  [id number]        Height:       61.0 in Accession #:    [id number]       Weight:       175.9 lb Date of Birth:  1950/10/11        BSA:          1.789 m Patient Age:    21 years         BP:           123/93 mmHg Patient Gender: F                HR:           92 bpm. Exam Location:  Inpatient Procedure: 2D Echo Indications:    CHF 428  History:        Patient has prior history of Echocardiogram examinations, most                 recent 06/24/2019. Previous Myocardial Infarction and CAD, Prior                 CABG, COPD; Risk Factors:Hypertension, Dyslipidemia and Former                 Smoker. PAF.  Sonographer:    Jannett Celestine RDCS (AE) Referring Phys: [place]  1. Limited study to assess impella placement; severe LV dysfunction; no pericardial effusion; impella noted in LVOT (4.1-4.3 cm from aortic valve). FINDINGS  Additional Comments: Limited study to assess impella placement; severe LV dysfunction; no pericardial effusion; impella noted in LVOT (4.1-4.3 cm from aortic valve). Kirk Ruths MD Electronically signed by Kirk Ruths MD Signature Date/Time: 06/25/2019/12:03:16  PM    Final      Medications:     Scheduled Medications: . sodium  chloride   Intravenous Once  . aspirin  81 mg Oral Daily  . atorvastatin  80 mg Oral Daily  . Chlorhexidine Gluconate Cloth  6 each Topical Daily  . clopidogrel  75 mg Oral Daily  . digoxin  0.125 mg Oral Daily  . docusate sodium  100 mg Oral Daily  . ezetimibe  10 mg Oral Daily  . insulin aspart  0-9 Units Subcutaneous Q4H  . LORazepam  0.5 mg Intravenous Once  . mouth rinse  15 mL Mouth Rinse BID  . pantoprazole  40 mg Oral Daily  . polyethylene glycol  17 g Per Tube Daily  . potassium chloride  40 mEq Oral Once  . sacubitril-valsartan  1 tablet Oral BID  . sodium chloride flush  10-40 mL Intracatheter Q12H  . spironolactone  12.5 mg Oral Daily    Infusions: . sodium chloride Stopped (06/26/19 0654)  . amiodarone 60 mg/hr (06/26/19 0700)  . epinephrine Stopped (06/24/19 1719)  . impella catheter heparin 50 unit/mL in dextrose 5%    . heparin 700 Units/hr (06/26/19 0700)  . lactated ringers 20 mL/hr at 06/26/19 0700  . magnesium sulfate bolus IVPB 50 mL/hr at 06/26/19 0700  . nitroGLYCERIN Stopped (06/25/19 1304)    PRN Medications: sodium chloride, acetaminophen **OR** acetaminophen, ALPRAZolam, alum & mag hydroxide-simeth, diphenhydrAMINE **OR** diphenhydrAMINE, fentaNYL (SUBLIMAZE) injection, gabapentin, guaiFENesin-dextromethorphan, metoprolol tartrate, ondansetron, phenol, sodium chloride flush   Assessment/Plan   1. Cardiogenic shock: Progressive worsening 4/19 culminating in PEA arrest.  Lactate 8.1.  She had episodes of hypotension and atrial fibrillation/RVR earlier in her post-op course as well.  Symptoms were dyspnea and anxiety no chest pain.  No STEMI on ECG but concern for possible coronary event based on history.  Initial troponin 1802 => 2957.  Initially on NE, epi then Impella 5.5 placed on 4/19.  Now MAP is stable and off pressors, on nicardipine 2.5.  Lactate has normalized.  2. Acute systolic CHF: Suspect ischemic cardiomyopathy.  Echo today with EF 15-20%,  diffuse hypokinesis, preserved RV function.  Concern for coronary event based on her prior history.  Pre-op echo with EF 60%.  HS-TnI 1802 => 2957, elevated but not markedly high.  This morning, good CI and co-ox with no Impella alarms. CVP 8 this morning. LDH and platelets stable. Impella at 4 cm on echo this morning but given stability I did not change position.  - I will start weaning Impella, decrease to P6 today. She remains on heparin gtt for Impella. - Will give dose Lasix 20 mg IV x 1.  Aim to keep I/Os near even.  - Stop nicardipine and add Entresto 24/26 bid.  - Continue digoxin  - Continue spironolactone 12.5 daily.  - Replace K and Mg.  3. AKI: Creatinine as high as 2.86, had started trending down and is 1.16 today.  Follow closely.  4. CAD: LHC in 3/21 showed occluded SVG-RCA and SVG-OM and atretic LIMA-LAD.  The LAD was patent with 60% mid vessel stenosis, the RCA was occluded with collaterals, and OM1 and OM2 were occluded.  No intervention.  Surprisingly, EF reported as normal pre-op.  No chest pain but had dyspnea and anxiety today.  ECG did not show STEMI, showed fairly stable lateral TWIs.  Still concerned for possible coronary event as trigger for fall in EF and shock.  HS-TnI to 2957.  - Stabilize  with Impella - Will need repeat angiography but access is going to be challenging.  Has fresh aortobifemoral bypass and Impella in right subclavian artery, unable to palpate left radial pulse (and could not place arterial line there).  Will am to use left brachial artery access, micropuncture, tentatively Thursday.  - Continue statin, ASA, and Plavix.   - Heparin gtt with Impella.  5. PAD: S/p aortobifemoral bypass on 4/13.  Surgical site is stable, she has had palpable PT pulses.  6. Acute hypoxemic respiratory failure: Intubated with PEA arrest. Resolved, extubated.   7. Atrial fibrillation: Paroxysmal.  She has has been in and out of atrial fibrillation post-op.  Has had episodes of  junctional rhythm also.  ?NSR currently.  - Continue amiodarone gtt, decrease to 30 mg/hr.  - She is on heparin gtt.  - ECG this morning => confirms NSR. 8. Thrombocytopenia: Post-Impella placement.  Plts higher at 153.  LDH stable, does not appear to have significant hemolysis.  9. Diarrhea/abdominal discomfort: Not bloody, WBCs 11 w/o fever.  Hopefully not ischemic colitis now that cardiac output is restored. Symptomatic treatment for now.   CRITICAL CARE Performed by: Loralie Champagne  Total critical care time: 45 minutes  Critical care time was exclusive of separately billable procedures and treating other patients.  Critical care was necessary to treat or prevent imminent or life-threatening deterioration.  Critical care was time spent personally by me on the following activities: development of treatment plan with patient and/or surrogate as well as nursing, discussions with consultants, evaluation of patient's response to treatment, examination of patient, obtaining history from patient or surrogate, ordering and performing treatments and interventions, ordering and review of laboratory studies, ordering and review of radiographic studies, pulse oximetry and re-evaluation of patient's condition.   Length of Stay: 8  Loralie Champagne, MD  06/26/2019, 7:32 AM  Advanced Heart Failure Team Pager [phone] (M-F; 7a - 4p)  Please contact [facility] for night-coverage after hours (4p -7a ) and weekends on [email or web address]

## 2019-06-27 ENCOUNTER — Encounter (HOSPITAL_COMMUNITY)
Admission: RE | Disposition: A | Discharge status: Skilled Nursing Facility | Payer: Self-pay | Source: Home / Self Care | Attending: Vascular Surgery

## 2019-06-27 DIAGNOSIS — I2510 Atherosclerotic heart disease of native coronary artery without angina pectoris: Secondary | ICD-10-CM

## 2019-06-27 DIAGNOSIS — I480 Paroxysmal atrial fibrillation: Secondary | ICD-10-CM | POA: Diagnosis not present

## 2019-06-27 DIAGNOSIS — R570 Cardiogenic shock: Secondary | ICD-10-CM | POA: Diagnosis not present

## 2019-06-27 HISTORY — PX: CORONARY ANGIOGRAPHY: CATH118303

## 2019-06-27 LAB — COMPREHENSIVE METABOLIC PANEL
ALT: 23 U/L (ref 0–44)
AST: 21 U/L (ref 15–41)
Albumin: 2.1 g/dL — ABNORMAL LOW (ref 3.5–5.0)
Alkaline Phosphatase: 69 U/L (ref 38–126)
Anion gap: 9 (ref 5–15)
BUN: 12 mg/dL (ref 8–23)
CO2: 27 mmol/L (ref 22–32)
Calcium: 7.7 mg/dL — ABNORMAL LOW (ref 8.9–10.3)
Chloride: 105 mmol/L (ref 98–111)
Creatinine, Ser: 1.21 mg/dL — ABNORMAL HIGH (ref 0.44–1.00)
GFR calc Af Amer: 54 mL/min — ABNORMAL LOW (ref >60)
GFR calc non Af Amer: 46 mL/min — ABNORMAL LOW (ref >60)
Glucose, Bld: 118 mg/dL — ABNORMAL HIGH (ref 70–99)
Potassium: 3.7 mmol/L (ref 3.5–5.1)
Sodium: 141 mmol/L (ref 135–145)
Total Bilirubin: 0.9 mg/dL (ref 0.3–1.2)
Total Protein: 4.6 g/dL — ABNORMAL LOW (ref 6.5–8.1)

## 2019-06-27 LAB — CBC
HCT: 32 % — ABNORMAL LOW (ref 36.0–46.0)
HCT: 29.2 % — ABNORMAL LOW (ref 36.0–46.0)
Hemoglobin: 10.3 g/dL — ABNORMAL LOW (ref 12.0–15.0)
Hemoglobin: 9.1 g/dL — ABNORMAL LOW (ref 12.0–15.0)
MCH: 29.3 pg (ref 26.0–34.0)
MCH: 28.9 pg (ref 26.0–34.0)
MCHC: 32.2 g/dL (ref 30.0–36.0)
MCHC: 31.2 g/dL (ref 30.0–36.0)
MCV: 91.2 fL (ref 80.0–100.0)
MCV: 92.7 fL (ref 80.0–100.0)
Platelets: 149 10*3/uL — ABNORMAL LOW (ref 150–400)
Platelets: 153 10*3/uL (ref 150–400)
RBC: 3.51 MIL/uL — ABNORMAL LOW (ref 3.87–5.11)
RBC: 3.15 MIL/uL — ABNORMAL LOW (ref 3.87–5.11)
RDW: 16.6 % — ABNORMAL HIGH (ref 11.5–15.5)
RDW: 16.9 % — ABNORMAL HIGH (ref 11.5–15.5)
WBC: 10.4 10*3/uL (ref 4.0–10.5)
WBC: 11.5 10*3/uL — ABNORMAL HIGH (ref 4.0–10.5)
nRBC: 0.2 % (ref 0.0–0.2)
nRBC: 0.2 % (ref 0.0–0.2)

## 2019-06-27 LAB — COOXEMETRY PANEL
Carboxyhemoglobin: 2.1 % — ABNORMAL HIGH (ref 0.5–1.5)
Methemoglobin: 1.2 % (ref 0.0–1.5)
O2 Saturation: 66.4 %
Total hemoglobin: 10.6 g/dL — ABNORMAL LOW (ref 12.0–16.0)

## 2019-06-27 LAB — GLUCOSE, CAPILLARY
Glucose-Capillary: 97 mg/dL (ref 70–99)
Glucose-Capillary: 90 mg/dL (ref 70–99)
Glucose-Capillary: 106 mg/dL — ABNORMAL HIGH (ref 70–99)
Glucose-Capillary: 159 mg/dL — ABNORMAL HIGH (ref 70–99)

## 2019-06-27 LAB — HEPARIN LEVEL (UNFRACTIONATED)
Heparin Unfractionated: 0.24 IU/mL — ABNORMAL LOW (ref 0.30–0.70)
Heparin Unfractionated: 0.27 IU/mL — ABNORMAL LOW (ref 0.30–0.70)
Heparin Unfractionated: 0.21 IU/mL — ABNORMAL LOW (ref 0.30–0.70)

## 2019-06-27 LAB — LACTATE DEHYDROGENASE: LDH: 360 U/L — ABNORMAL HIGH (ref 98–192)

## 2019-06-27 LAB — POCT ACTIVATED CLOTTING TIME
Activated Clotting Time: 158 seconds
Activated Clotting Time: 219 seconds
Activated Clotting Time: 175 seconds

## 2019-06-27 SURGERY — CORONARY ANGIOGRAPHY (CATH LAB)
Anesthesia: LOCAL

## 2019-06-27 MED ORDER — LIDOCAINE HCL (PF) 1 % IJ SOLN
INTRAMUSCULAR | Status: AC
  Filled 2019-06-27: qty 30

## 2019-06-27 MED ORDER — SACUBITRIL-VALSARTAN 49-51 MG PO TABS
1.0000 | ORAL_TABLET | Freq: Two times a day (BID) | ORAL | Status: DC
  Administered 2019-06-27 (×2): 1 via ORAL
  Filled 2019-06-27 (×3): qty 1

## 2019-06-27 MED ORDER — SPIRONOLACTONE 25 MG PO TABS
25.0000 mg | ORAL_TABLET | Freq: Every day | ORAL | Status: DC
  Administered 2019-06-27 – 2019-07-12 (×16): 25 mg via ORAL
  Filled 2019-06-27 (×16): qty 1

## 2019-06-27 MED ORDER — SODIUM CHLORIDE 0.9 % IV SOLN: INTRAVENOUS | Status: AC

## 2019-06-27 MED ORDER — MIDAZOLAM HCL 2 MG/2ML IJ SOLN
INTRAMUSCULAR | Status: DC | PRN
  Administered 2019-06-27 (×2): 1 mg via INTRAVENOUS

## 2019-06-27 MED ORDER — HEPARIN SODIUM (PORCINE) 1000 UNIT/ML IJ SOLN
INTRAMUSCULAR | Status: DC | PRN
  Administered 2019-06-27: 2000 [IU] via INTRAVENOUS

## 2019-06-27 MED ORDER — HEPARIN (PORCINE) 25000 UT/250ML-% IV SOLN
900.0000 [IU]/h | INTRAVENOUS | Status: DC
  Administered 2019-06-27 – 2019-06-28 (×2): 900 [IU]/h via INTRAVENOUS
  Filled 2019-06-27: qty 250

## 2019-06-27 MED ORDER — HEPARIN (PORCINE) IN NACL 1000-0.9 UT/500ML-% IV SOLN
INTRAVENOUS | Status: AC
  Filled 2019-06-27: qty 1000

## 2019-06-27 MED ORDER — ASPIRIN 325 MG PO TABS
ORAL_TABLET | ORAL | Status: AC
  Filled 2019-06-27: qty 1

## 2019-06-27 MED ORDER — HEPARIN (PORCINE) IN NACL 1000-0.9 UT/500ML-% IV SOLN
INTRAVENOUS | Status: DC | PRN
  Administered 2019-06-27 (×2): 500 mL

## 2019-06-27 MED ORDER — VERAPAMIL HCL 2.5 MG/ML IV SOLN
INTRAVENOUS | Status: DC | PRN
  Administered 2019-06-27: 10 mL via INTRA_ARTERIAL

## 2019-06-27 MED ORDER — SODIUM CHLORIDE 0.9% FLUSH: 3.0000 mL | INTRAVENOUS | Status: DC | PRN

## 2019-06-27 MED ORDER — FENTANYL CITRATE (PF) 100 MCG/2ML IJ SOLN
INTRAMUSCULAR | Status: DC | PRN
  Administered 2019-06-27 (×2): 25 ug via INTRAVENOUS

## 2019-06-27 MED ORDER — IOHEXOL 350 MG/ML SOLN
INTRAVENOUS | Status: DC | PRN
  Administered 2019-06-27: 30 mL via INTRA_ARTERIAL

## 2019-06-27 MED ORDER — FENTANYL CITRATE (PF) 100 MCG/2ML IJ SOLN
INTRAMUSCULAR | Status: AC
  Filled 2019-06-27: qty 2

## 2019-06-27 MED ORDER — SODIUM CHLORIDE 0.9% FLUSH
3.0000 mL | Freq: Two times a day (BID) | INTRAVENOUS | Status: DC
  Administered 2019-06-27 – 2019-07-12 (×20): 3 mL via INTRAVENOUS

## 2019-06-27 MED ORDER — HYDRALAZINE HCL 20 MG/ML IJ SOLN: 10.0000 mg | INTRAMUSCULAR | Status: AC | PRN

## 2019-06-27 MED ORDER — SODIUM CHLORIDE 0.9 % IV SOLN: 250.0000 mL | INTRAVENOUS | Status: DC | PRN

## 2019-06-27 MED ORDER — MIDAZOLAM HCL 2 MG/2ML IJ SOLN
INTRAMUSCULAR | Status: AC
  Filled 2019-06-27: qty 2

## 2019-06-27 MED ORDER — SODIUM CHLORIDE 0.9% FLUSH: 10.0000 mL | INTRAVENOUS | Status: DC | PRN

## 2019-06-27 MED ORDER — LIDOCAINE HCL (PF) 1 % IJ SOLN
INTRAMUSCULAR | Status: DC | PRN
  Administered 2019-06-27: 5 mL via INTRADERMAL

## 2019-06-27 MED ORDER — SODIUM CHLORIDE 0.9% FLUSH
10.0000 mL | Freq: Two times a day (BID) | INTRAVENOUS | Status: DC
  Administered 2019-06-27 – 2019-06-28 (×2): 10 mL

## 2019-06-27 MED ORDER — PROMETHAZINE HCL 25 MG/ML IJ SOLN
12.5000 mg | Freq: Once | INTRAMUSCULAR | Status: AC
  Administered 2019-06-27: 12.5 mg via INTRAVENOUS
  Filled 2019-06-27: qty 1

## 2019-06-27 MED ORDER — POTASSIUM CHLORIDE CRYS ER 20 MEQ PO TBCR
20.0000 meq | EXTENDED_RELEASE_TABLET | Freq: Once | ORAL | Status: AC
  Administered 2019-06-27: 20 meq via ORAL
  Filled 2019-06-27: qty 1

## 2019-06-27 MED ORDER — LABETALOL HCL 5 MG/ML IV SOLN: 10.0000 mg | INTRAVENOUS | Status: AC | PRN

## 2019-06-27 MED FILL — Sodium Chloride IV Soln 0.9%: INTRAVENOUS | Qty: 1000 | Status: AC

## 2019-06-27 MED FILL — Sodium Chloride Irrigation Soln 0.9%: Qty: 3000 | Status: AC

## 2019-06-27 MED FILL — Heparin Sodium (Porcine) Inj 1000 Unit/ML: INTRAMUSCULAR | Qty: 30 | Status: AC

## 2019-06-27 SURGICAL SUPPLY — 11 items
CATH INFINITI 5FR MULTPACK ANG (CATHETERS) ×1 IMPLANT
ELECT DEFIB PAD ADLT CADENCE (PAD) ×1 IMPLANT
GLIDESHEATH SLEND SS 6F .021 (SHEATH) ×1 IMPLANT
GUIDEWIRE INQWIRE 1.5J.035X260 (WIRE) IMPLANT
HOVERMATT SINGLE USE (MISCELLANEOUS) ×1 IMPLANT
INQWIRE 1.5J .035X260CM (WIRE) ×2
KIT HEART LEFT (KITS) ×2 IMPLANT
PACK CARDIAC CATHETERIZATION (CUSTOM PROCEDURE TRAY) ×2 IMPLANT
SHEATH PROBE COVER 6X72 (BAG) ×1 IMPLANT
TRANSDUCER W/STOPCOCK (MISCELLANEOUS) ×2 IMPLANT
TUBING CIL FLEX 10 FLL-RA (TUBING) ×2 IMPLANT

## 2019-06-27 NOTE — H&P (View-Only) (Signed)
Patient ID: Wendy [name], female   DOB: [birth year], 69 y.o.   MRN: [id number]     Advanced Heart Failure Rounding Note  PCP-Cardiologist: [name], MD   Subjective:    - 4/13 aortobifemoral bypass - Impella 5.5 placement 4/19 for cardiogenic shock.  - Extubated 4/20  She is stable hemodynamically this morning.  She remains on amiodarone 30 mg/hr. She got 40 mg IV Lasix yesterday with good response, I/Os negative.   She is in NSR today.   Feeling better, abdominal pain decreased and diarrhea mostly resolved.   Swan numbers: CVP 5 PA 30/13 CI 2.7 Co-ox 66%  Impella: P5, flow 2.9 L/min.  No alarms. LDH 360  Objective:   Weight Range: 75.5 kg Body mass index is 31.45 kg/m.   Vital Signs:   Temp:  [98.4 F (36.9 C)-99.7 F (37.6 C)] 98.8 F (37.1 C) (04/22 0700) Pulse Rate:  [60-95] 73 (04/22 0700) Resp:  [14-24] 17 (04/22 0700) BP: (98-136)/(54-86) 136/66 (04/22 0700) SpO2:  [92 %-100 %] 96 % (04/22 0700) Weight:  [75.5 kg] 75.5 kg (04/22 0500) Last BM Date: (FMS)  Weight change: Filed Weights   06/25/19 0430 06/26/19 0615 06/27/19 0500  Weight: 79.8 kg 78.3 kg 75.5 kg    Intake/Output:   Intake/Output Summary (Last 24 hours) at 06/27/2019 0726 Last data filed at 06/27/2019 0700 Gross per 24 hour  Intake 981.5 ml  Output 4183 ml  Net -3201.5 ml      Physical Exam    General: NAD Neck: No JVD, no thyromegaly or thyroid nodule.  Lungs: Clear to auscultation bilaterally with normal respiratory effort. CV: Nondisplaced PMI.  Heart regular S1/S2, no S3/S4, no murmur. Impella sounds. No peripheral edema.  PT pulses palpable.  Unable to palpate left radial pulse.  Abdomen: Soft, nontender, no hepatosplenomegaly, no distention.  Skin: Intact without lesions or rashes.  Neurologic: Alert and oriented x 3.  Psych: Normal affect. Extremities: No clubbing or cyanosis.  HEENT: Normal.    Telemetry   NSR 90s (personally reviewed)  Labs     CBC Recent Labs    06/24/19 1458 06/24/19 1459 06/26/19 1810 06/27/19 0201  WBC 11.9*   < > 11.0* 10.4  NEUTROABS 7.9*  --   --   --   HGB 10.3*   < > 10.3* 10.3*  HCT 34.1*   < > 31.8* 32.0*  MCV 96.9   < > 90.3 91.2  PLT 301   < > 157 149*   < > = values in this interval not displayed.   Basic Metabolic Panel Recent Labs    06/25/19 0442 06/25/19 0442 06/26/19 0417 06/26/19 0417 06/26/19 1308 06/27/19 0201  NA 148*   < > 144   < > 138 141  K 3.1*   < > 2.9*   < > 3.1* 3.7  CL 116*   < > 109   < > 103 105  CO2 24   < > 26   < > 26 27  GLUCOSE 123*   < > 134*   < > 115* 118*  BUN 16   < > 13   < > 12 12  CREATININE 1.22*   < > 1.16*   < > 1.08* 1.21*  CALCIUM 8.1*   < > 7.8*   < > 7.6* 7.7*  MG 1.7  --  1.6*  --   --   --    < > = values in this interval not displayed.  Liver Function Tests Recent Labs    06/26/19 0417 06/27/19 0201  AST 28 21  ALT 31 23  ALKPHOS 76 69  BILITOT 1.0 0.9  PROT 4.6* 4.6*  ALBUMIN 2.2* 2.1*   No results for input(s): LIPASE, AMYLASE in the last 72 hours. Cardiac Enzymes Recent Labs    06/24/19 1458  CKTOTAL 86  CKMB 11.2*    BNP: BNP (last 3 results) Recent Labs    06/19/19 1239  BNP 366.7*    ProBNP (last 3 results) No results for input(s): PROBNP in the last 8760 hours.   D-Dimer No results for input(s): DDIMER in the last 72 hours. Hemoglobin A1C Recent Labs    06/24/19 2318  HGBA1C 5.4   Fasting Lipid Panel No results for input(s): CHOL, HDL, LDLCALC, TRIG, CHOLHDL, LDLDIRECT in the last 72 hours. Thyroid Function Tests No results for input(s): TSH, T4TOTAL, T3FREE, THYROIDAB in the last 72 hours.  Invalid input(s): FREET3  Other results:   Imaging    DG CHEST PORT 1 VIEW  Result Date: 06/26/2019 CLINICAL DATA:  Chest pain and shortness of breath. EXAM: PORTABLE CHEST 1 VIEW COMPARISON:  06/25/2019 FINDINGS: The left IJ Swan-Ganz catheter is stable. The tip is in the proximal right  pulmonary artery. The endotracheal tube and NG tubes have been removed. Right subclavian Impella device is in stable position. No complicating features. Improved lung aeration with resolving edema and atelectasis. IMPRESSION: 1. Interval extubation and removal of NG tube. Other support apparatus is stable. 2. Improved lung aeration with resolving edema and atelectasis. Electronically Signed   By: Marijo Sanes M.D.   On: 06/26/2019 10:03   ECHOCARDIOGRAM LIMITED  Result Date: 06/26/2019    ECHOCARDIOGRAM LIMITED REPORT   Patient Name:   Wendy [name] Date of Exam: 06/26/2019 Medical Rec #:  [id number]        Height:       61.0 in Accession #:    [id number]       Weight:       172.6 lb Date of Birth:  1950-05-23        BSA:          1.774 m Patient Age:    11 years         BP:           108/66 mmHg Patient Gender: F                HR:           86 bpm. Exam Location:  Inpatient Procedure: Limited Echo and Color Doppler Indications:    Check Impella Position  History:        Patient has prior history of Echocardiogram examinations, most                 recent 06/25/2019. CHF, Prior CABG, COPD; Risk Factors:Sleep                 Apnea, Hypertension and Dyslipidemia. Impella placed 06/24/19.  Sonographer:    Raquel Sarna Senior RDCS Referring Phys: [place]  1. Impella cannula 3.5-4.0 cm from AoV annulus, good position. Left ventricular ejection fraction, by estimation, is 20 to 25%. The left ventricle has severely decreased function. The left ventricle demonstrates global hypokinesis.  2. Right ventricular systolic function is normal. The right ventricular size is normal. FINDINGS  Left Ventricle: Impella cannula 3.5-4.0 cm from AoV annulus, good position. Left ventricular ejection fraction, by estimation, is 20 to  25%. The left ventricle has severely decreased function. The left ventricle demonstrates global hypokinesis. The left  ventricular internal cavity size was normal in size. Right  Ventricle: The right ventricular size is normal. No increase in right ventricular wall thickness. Right ventricular systolic function is normal. Left Atrium: Left atrial size was normal in size. Right Atrium: Right atrial size was normal in size. Pericardium: Trivial pericardial effusion is present. Presence of pericardial fat pad. Tricuspid Valve: The tricuspid valve is grossly normal. Tricuspid valve regurgitation is mild . No evidence of tricuspid stenosis. Additional Comments: A venous catheter is visualized in the right ventricle.  RIGHT VENTRICLE RV S prime:     10.60 cm/s TAPSE (M-mode): 1.5 cm Eleonore Chiquito MD Electronically signed by Eleonore Chiquito MD Signature Date/Time: 06/26/2019/9:12:52 AM    Final      Medications:     Scheduled Medications: . sodium chloride   Intravenous Once  . aspirin  81 mg Oral Daily  . atorvastatin  80 mg Oral Daily  . buPROPion  150 mg Oral Daily  . Chlorhexidine Gluconate Cloth  6 each Topical Daily  . clopidogrel  75 mg Oral Daily  . digoxin  0.125 mg Oral Daily  . ezetimibe  10 mg Oral Daily  . insulin aspart  0-9 Units Subcutaneous Q4H  . LORazepam  0.5 mg Intravenous Once  . mouth rinse  15 mL Mouth Rinse BID  . pantoprazole  40 mg Oral Daily  . potassium chloride  20 mEq Oral Once  . sacubitril-valsartan  1 tablet Oral BID  . sertraline  150 mg Oral QHS  . sodium chloride flush  10-40 mL Intracatheter Q12H  . sodium chloride flush  10-40 mL Intracatheter Q12H  . sodium chloride flush  3 mL Intravenous Q12H  . spironolactone  25 mg Oral Daily    Infusions: . sodium chloride Stopped (06/26/19 0859)  . sodium chloride    . sodium chloride    . amiodarone 30 mg/hr (06/27/19 0345)  . epinephrine Stopped (06/24/19 1719)  . impella catheter heparin 50 unit/mL in dextrose 5%    . heparin 900 Units/hr (06/26/19 1902)  . lactated ringers 20 mL/hr at 06/26/19 1800  . nitroGLYCERIN Stopped (06/25/19 1304)    PRN Medications: sodium chloride,  sodium chloride, acetaminophen **OR** acetaminophen, ALPRAZolam, alum & mag hydroxide-simeth, diphenhydrAMINE **OR** diphenhydrAMINE, docusate sodium, fentaNYL (SUBLIMAZE) injection, gabapentin, guaiFENesin-dextromethorphan, metoprolol tartrate, ondansetron, oxyCODONE-acetaminophen, phenol, polyethylene glycol, sodium chloride flush, sodium chloride flush, sodium chloride flush   Assessment/Plan   1. Cardiogenic shock: Progressive worsening 4/19 culminating in PEA arrest.  Lactate 8.1.  She had episodes of hypotension and atrial fibrillation/RVR earlier in her post-op course as well.  Symptoms were dyspnea and anxiety no chest pain.  No STEMI on ECG but concern for possible coronary event based on history.  Initial troponin 1802 => 2957.  Initially on NE, epi then Impella 5.5 placed on 4/19.  Now MAP is stable and off pressors.  Lactate has normalized.  2. Acute systolic CHF: Suspect ischemic cardiomyopathy.  Echo today with EF 15-20%, diffuse hypokinesis, preserved RV function.  Concern for coronary event based on her prior history.  Pre-op echo with EF 60%.  HS-TnI 1802 => 2957, elevated but not markedly high.  This morning, good CI and co-ox with no Impella alarms. CVP 5 this morning. LDH and platelets stable.  - Wean Impella to P4 today. She remains on heparin gtt for Impella.  Hopefully can remove Impella tomorrow.  - No  Lasix today.  - Increase Entresto to 49/51 bid.  - Continue digoxin  - Increase spironolactone to 25 mg daily.   - Replace K.  3. AKI: Creatinine as high as 2.86, started trending down and is 1.2 today.  Follow closely.  4. CAD: LHC in 3/21 showed occluded SVG-RCA and SVG-OM and atretic LIMA-LAD.  The LAD was patent with 60% mid vessel stenosis, the RCA was occluded with collaterals, and OM1 and OM2 were occluded.  No intervention.  Surprisingly, EF reported as normal pre-op.  No chest pain but had dyspnea and anxiety today.  ECG did not show STEMI, showed fairly stable lateral  TWIs.  Still concerned for possible coronary event as trigger for fall in EF and shock.  HS-TnI to 2957.  - Stabilize with Impella - Will need repeat angiography but access is going to be challenging.  Has fresh aortobifemoral bypass and Impella in right subclavian artery, unable to palpate left radial pulse (and could not place arterial line there).  Will am to use left brachial artery access, micropuncture, later today. Discussed risks/benefits with patient and she agrees to procedure.  - Continue statin, ASA, and Plavix.  If no stent needed, will eventually stop ASA/Plavix as she will be on long-term anticoagulation with atrial fibrillation.  - Heparin gtt with Impella.  5. PAD: S/p aortobifemoral bypass on 4/13.  Surgical site is stable, she has had palpable PT pulses.  6. Acute hypoxemic respiratory failure: Intubated with PEA arrest. Resolved, extubated.   7. Atrial fibrillation: Paroxysmal.  She has has been in and out of atrial fibrillation post-op.  Has had episodes of junctional rhythm also.  NSR currently.   - Continue amiodarone gtt 30 mg/hr.  - She is on heparin gtt, will need to transition eventually to Eliquis.  8. Thrombocytopenia: Post-Impella placement. Plts now stable.  LDH stable, does not appear to have significant hemolysis.  9. Diarrhea/abdominal discomfort: Not bloody, WBCs 10.4 w/o fever.  Hopefully not ischemic colitis now that cardiac output is restored. Symptomatic treatment for now.  Improving.   CRITICAL CARE Performed by: Loralie Champagne  Total critical care time: 40 minutes  Critical care time was exclusive of separately billable procedures and treating other patients.  Critical care was necessary to treat or prevent imminent or life-threatening deterioration.  Critical care was time spent personally by me on the following activities: development of treatment plan with patient and/or surrogate as well as nursing, discussions with consultants, evaluation of  patient's response to treatment, examination of patient, obtaining history from patient or surrogate, ordering and performing treatments and interventions, ordering and review of laboratory studies, ordering and review of radiographic studies, pulse oximetry and re-evaluation of patient's condition.   Length of Stay: 9  Loralie Champagne, MD  06/27/2019, 7:26 AM  Advanced Heart Failure Team Pager [phone] (M-F; 7a - 4p)  Please contact [facility] for night-coverage after hours (4p -7a ) and weekends on [email or web address]

## 2019-06-27 NOTE — Progress Notes (Signed)
ANTICOAGULATION CONSULT NOTE - Follow-Up Consult  Pharmacy Consult for Heparin Indication: Afib + new Impella  Patient Measurements: Height: 5\' 1"  (154.9 cm) Weight: 78.3 kg (172 lb 9.9 oz) IBW/kg (Calculated) : 47.8  Ideal body weight: 47.8 kg (105 lb 6.1 oz) Adjusted ideal body weight: 60 kg (132 lb 4.4 oz)  Heparin Dosing Weight: 65 kg  Vital Signs: Temp: 98.4 F (36.9 C) (04/22 0245) BP: 118/63 (04/22 0230) Pulse Rate: 66 (04/22 0245)  Labs: Recent Labs     0000 06/24/19 1458 06/24/19 1459 06/24/19 2315 06/25/19 0239 06/25/19 0800 06/25/19 0944 06/25/19 1130 06/26/19 0200 06/26/19 0417 06/26/19 0417 06/26/19 0500 06/26/19 1308 06/26/19 1810 06/27/19 0201  HGB   < > 10.3*   < >  --    < >  --   --   --   --  10.0*   < >  --   --  10.3* 10.3*  HCT   < > 34.1*   < >  --    < >  --   --   --   --  31.1*  --   --   --  31.8* 32.0*  PLT   < > 301  --   --    < >  --   --   --   --  153  --   --   --  157 149*  LABPROT  --  20.8*  --   --   --   --   --   --   --   --   --   --   --   --   --   INR  --  1.8*  --   --   --   --   --   --   --   --   --   --   --   --   --   HEPARINUNFRC  --   --   --   --    < >  --   --    < >   < >  --   --  0.10*  --  0.14* 0.24*  CREATININE   < > 1.33*   < >  --    < >  --   --   --   --  1.16*  --   --  1.08*  --  1.21*  CKTOTAL  --  86  --   --   --   --   --   --   --   --   --   --   --   --   --   CKMB  --  11.2*  --   --   --   --   --   --   --   --   --   --   --   --   --   TROPONINIHS  --  1,802*   < > 2,957*  --  2,749* 2,533*  --   --   --   --   --   --   --   --    < > = values in this interval not displayed.    Estimated Creatinine Clearance: 42.7 mL/min (A) (by C-G formula based on SCr of 1.21 mg/dL (H)).   Medications:  Infusions:  . sodium chloride Stopped (06/26/19 0859)  . sodium chloride    . sodium  chloride    . amiodarone 30 mg/hr (06/26/19 1800)  . epinephrine Stopped (06/24/19 1719)  . impella  catheter heparin 50 unit/mL in dextrose 5%    . heparin 900 Units/hr (06/26/19 1902)  . lactated ringers 20 mL/hr at 06/26/19 1800  . nitroGLYCERIN Stopped (06/25/19 1304)    Assessment: 32 YOF who underwent aortobifemoral bypass on 4/13 and was noted to have post-op Afib with RVR and was initiated on Heparin IV. On 4/19 the patient went into respiratory distress requiring intubation, followed by a cardiac arrest. ECHO at the time showed a low EF of 10-15% and the patient was taken emergently to the OR for Impella placement. The patient is now s/p OR and pharmacy is monitor for anticoagulation with Heparin given the Impella 5.5 + Afib.  Systemic heparin was initiated 6 hours postop.   Per protocol, systemic heparin rate not to exceed 1000 units/hr or 10 units/kg/hr unless direct conversation with surgeon  4/22 AM update:  Heparin level therapeutic after rate increase  Goal of Therapy:  Heparin level 0.2-0.5 units/ml Monitor platelets by anticoagulation protocol: Yes   Plan:  - Cont heparin at 900 units/hr - Confirmatory heparin level at [place], PharmD, [place] Pharmacist Phone: [phone]

## 2019-06-27 NOTE — Progress Notes (Signed)
PT Cancellation Note  Patient Details Name: Wendy [name] MRN: [id number] DOB: 26-Mar-1950   Cancelled Treatment:    Reason Eval/Treat Not Completed: Patient at procedure or test/unavailable.   Shary Decamp [facility] 06/27/2019, 3:09 PM  [place] Pager [phone] Office [phone]

## 2019-06-27 NOTE — Progress Notes (Signed)
Patient ID: Wendy [name], female   DOB: September 12, 1950, 69 y.o.   MRN: [id number]     Advanced Heart Failure Rounding Note  PCP-Cardiologist: [name], MD   Subjective:    - 4/13 aortobifemoral bypass - Impella 5.5 placement 4/19 for cardiogenic shock.  - Extubated 4/20  She is stable hemodynamically this morning.  She remains on amiodarone 30 mg/hr. She got 40 mg IV Lasix yesterday with good response, I/Os negative.   She is in NSR today.   Feeling better, abdominal pain decreased and diarrhea mostly resolved.   [place]wan numbers: CVP 5 PA 30/13 CI 2.7 Co-ox 66%  Impella: P5, flow 2.9 L/min.  No alarms. LDH 360  Objective:   Weight Range: 75.5 kg Body mass index is 31.45 kg/m.   Vital Signs:   Temp:  [98.4 F (36.9 C)-99.7 F (37.6 C)] 98.8 F (37.1 C) (04/22 0700) Pulse Rate:  [60-95] 73 (04/22 0700) Resp:  [14-24] 17 (04/22 0700) BP: (98-136)/(54-86) 136/66 (04/22 0700) SpO2:  [92 %-100 %] 96 % (04/22 0700) Weight:  [75.5 kg] 75.5 kg (04/22 0500) Last BM Date: (FMS)  Weight change: Filed Weights   06/25/19 0430 06/26/19 0615 06/27/19 0500  Weight: 79.8 kg 78.3 kg 75.5 kg    Intake/Output:   Intake/Output Summary (Last 24 hours) at 06/27/2019 0726 Last data filed at 06/27/2019 0700 Gross per 24 hour  Intake 981.5 ml  Output 4183 ml  Net -3201.5 ml      Physical Exam    General: NAD Neck: No JVD, no thyromegaly or thyroid nodule.  Lungs: Clear to auscultation bilaterally with normal respiratory effort. CV: Nondisplaced PMI.  Heart regular S1/S2, no S3/S4, no murmur. Impella sounds. No peripheral edema.  PT pulses palpable.  Unable to palpate left radial pulse.  Abdomen: Soft, nontender, no hepatosplenomegaly, no distention.  Skin: Intact without lesions or rashes.  Neurologic: Alert and oriented x 3.  Psych: Normal affect. Extremities: No clubbing or cyanosis.  HEENT: Normal.    Telemetry   NSR 90s (personally reviewed)  Labs     CBC Recent Labs    06/24/19 1458 06/24/19 1459 06/26/19 1810 06/27/19 0201  WBC 11.9*   < > 11.0* 10.4  NEUTROABS 7.9*  --   --   --   HGB 10.3*   < > 10.3* 10.3*  HCT 34.1*   < > 31.8* 32.0*  MCV 96.9   < > 90.3 91.2  PLT 301   < > 157 149*   < > = values in this interval not displayed.   Basic Metabolic Panel Recent Labs    06/25/19 0442 06/25/19 0442 06/26/19 0417 06/26/19 0417 06/26/19 1308 06/27/19 0201  NA 148*   < > 144   < > 138 141  K 3.1*   < > 2.9*   < > 3.1* 3.7  CL 116*   < > 109   < > 103 105  CO2 24   < > 26   < > 26 27  GLUCOSE 123*   < > 134*   < > 115* 118*  BUN 16   < > 13   < > 12 12  CREATININE 1.22*   < > 1.16*   < > 1.08* 1.21*  CALCIUM 8.1*   < > 7.8*   < > 7.6* 7.7*  MG 1.7  --  1.6*  --   --   --    < > = values in this interval not displayed.  Liver Function Tests Recent Labs    06/26/19 0417 06/27/19 0201  AST 28 21  ALT 31 23  ALKPHOS 76 69  BILITOT 1.0 0.9  PROT 4.6* 4.6*  ALBUMIN 2.2* 2.1*   No results for input(s): LIPASE, AMYLASE in the last 72 hours. Cardiac Enzymes Recent Labs    06/24/19 1458  CKTOTAL 86  CKMB 11.2*    BNP: BNP (last 3 results) Recent Labs    06/19/19 1239  BNP 366.7*    ProBNP (last 3 results) No results for input(s): PROBNP in the last 8760 hours.   D-Dimer No results for input(s): DDIMER in the last 72 hours. Hemoglobin A1C Recent Labs    06/24/19 2318  HGBA1C 5.4   Fasting Lipid Panel No results for input(s): CHOL, HDL, LDLCALC, TRIG, CHOLHDL, LDLDIRECT in the last 72 hours. Thyroid Function Tests No results for input(s): TSH, T4TOTAL, T3FREE, THYROIDAB in the last 72 hours.  Invalid input(s): FREET3  Other results:   Imaging    DG CHEST PORT 1 VIEW  Result Date: 06/26/2019 CLINICAL DATA:  Chest pain and shortness of breath. EXAM: PORTABLE CHEST 1 VIEW COMPARISON:  06/25/2019 FINDINGS: The left IJ Swan-Ganz catheter is stable. The tip is in the proximal right  pulmonary artery. The endotracheal tube and NG tubes have been removed. Right subclavian Impella device is in stable position. No complicating features. Improved lung aeration with resolving edema and atelectasis. IMPRESSION: 1. Interval extubation and removal of NG tube. Other support apparatus is stable. 2. Improved lung aeration with resolving edema and atelectasis. Electronically Signed   By: Marijo Sanes M.D.   On: 06/26/2019 10:03   ECHOCARDIOGRAM LIMITED  Result Date: 06/26/2019    ECHOCARDIOGRAM LIMITED REPORT   Patient Name:   Wendy [name] Date of Exam: 06/26/2019 Medical Rec #:  [id number]        Height:       61.0 in Accession #:    [id number]       Weight:       172.6 lb Date of Birth:  [birth year]        BSA:          1.774 m Patient Age:    23 years         BP:           108/66 mmHg Patient Gender: F                HR:           86 bpm. Exam Location:  Inpatient Procedure: Limited Echo and Color Doppler Indications:    Check Impella Position  History:        Patient has prior history of Echocardiogram examinations, most                 recent 06/25/2019. CHF, Prior CABG, COPD; Risk Factors:Sleep                 Apnea, Hypertension and Dyslipidemia. Impella placed 06/24/19.  Sonographer:    Raquel Sarna Senior RDCS Referring Phys: [place]  1. Impella cannula 3.5-4.0 cm from AoV annulus, good position. Left ventricular ejection fraction, by estimation, is 20 to 25%. The left ventricle has severely decreased function. The left ventricle demonstrates global hypokinesis.  2. Right ventricular systolic function is normal. The right ventricular size is normal. FINDINGS  Left Ventricle: Impella cannula 3.5-4.0 cm from AoV annulus, good position. Left ventricular ejection fraction, by estimation, is 20 to  25%. The left ventricle has severely decreased function. The left ventricle demonstrates global hypokinesis. The left  ventricular internal cavity size was normal in size. Right  Ventricle: The right ventricular size is normal. No increase in right ventricular wall thickness. Right ventricular systolic function is normal. Left Atrium: Left atrial size was normal in size. Right Atrium: Right atrial size was normal in size. Pericardium: Trivial pericardial effusion is present. Presence of pericardial fat pad. Tricuspid Valve: The tricuspid valve is grossly normal. Tricuspid valve regurgitation is mild . No evidence of tricuspid stenosis. Additional Comments: A venous catheter is visualized in the right ventricle.  RIGHT VENTRICLE RV S prime:     10.60 cm/s TAPSE (M-mode): 1.5 cm Eleonore Chiquito MD Electronically signed by Eleonore Chiquito MD Signature Date/Time: 06/26/2019/9:12:52 AM    Final      Medications:     Scheduled Medications: . sodium chloride   Intravenous Once  . aspirin  81 mg Oral Daily  . atorvastatin  80 mg Oral Daily  . buPROPion  150 mg Oral Daily  . Chlorhexidine Gluconate Cloth  6 each Topical Daily  . clopidogrel  75 mg Oral Daily  . digoxin  0.125 mg Oral Daily  . ezetimibe  10 mg Oral Daily  . insulin aspart  0-9 Units Subcutaneous Q4H  . LORazepam  0.5 mg Intravenous Once  . mouth rinse  15 mL Mouth Rinse BID  . pantoprazole  40 mg Oral Daily  . potassium chloride  20 mEq Oral Once  . sacubitril-valsartan  1 tablet Oral BID  . sertraline  150 mg Oral QHS  . sodium chloride flush  10-40 mL Intracatheter Q12H  . sodium chloride flush  10-40 mL Intracatheter Q12H  . sodium chloride flush  3 mL Intravenous Q12H  . spironolactone  25 mg Oral Daily    Infusions: . sodium chloride Stopped (06/26/19 0859)  . sodium chloride    . sodium chloride    . amiodarone 30 mg/hr (06/27/19 0345)  . epinephrine Stopped (06/24/19 1719)  . impella catheter heparin 50 unit/mL in dextrose 5%    . heparin 900 Units/hr (06/26/19 1902)  . lactated ringers 20 mL/hr at 06/26/19 1800  . nitroGLYCERIN Stopped (06/25/19 1304)    PRN Medications: sodium chloride,  sodium chloride, acetaminophen **OR** acetaminophen, ALPRAZolam, alum & mag hydroxide-simeth, diphenhydrAMINE **OR** diphenhydrAMINE, docusate sodium, fentaNYL (SUBLIMAZE) injection, gabapentin, guaiFENesin-dextromethorphan, metoprolol tartrate, ondansetron, oxyCODONE-acetaminophen, phenol, polyethylene glycol, sodium chloride flush, sodium chloride flush, sodium chloride flush   Assessment/Plan   1. Cardiogenic shock: Progressive worsening 4/19 culminating in PEA arrest.  Lactate 8.1.  She had episodes of hypotension and atrial fibrillation/RVR earlier in her post-op course as well.  Symptoms were dyspnea and anxiety no chest pain.  No STEMI on ECG but concern for possible coronary event based on history.  Initial troponin 1802 => 2957.  Initially on NE, epi then Impella 5.5 placed on 4/19.  Now MAP is stable and off pressors.  Lactate has normalized.  2. Acute systolic CHF: Suspect ischemic cardiomyopathy.  Echo today with EF 15-20%, diffuse hypokinesis, preserved RV function.  Concern for coronary event based on her prior history.  Pre-op echo with EF 60%.  HS-TnI 1802 => 2957, elevated but not markedly high.  This morning, good CI and co-ox with no Impella alarms. CVP 5 this morning. LDH and platelets stable.  - Wean Impella to P4 today. She remains on heparin gtt for Impella.  Hopefully can remove Impella tomorrow.  - No  Lasix today.  - Increase Entresto to 49/51 bid.  - Continue digoxin  - Increase spironolactone to 25 mg daily.   - Replace K.  3. AKI: Creatinine as high as 2.86, started trending down and is 1.2 today.  Follow closely.  4. CAD: LHC in 3/21 showed occluded SVG-RCA and SVG-OM and atretic LIMA-LAD.  The LAD was patent with 60% mid vessel stenosis, the RCA was occluded with collaterals, and OM1 and OM2 were occluded.  No intervention.  Surprisingly, EF reported as normal pre-op.  No chest pain but had dyspnea and anxiety today.  ECG did not show STEMI, showed fairly stable lateral  TWIs.  Still concerned for possible coronary event as trigger for fall in EF and shock.  HS-TnI to 2957.  - Stabilize with Impella - Will need repeat angiography but access is going to be challenging.  Has fresh aortobifemoral bypass and Impella in right subclavian artery, unable to palpate left radial pulse (and could not place arterial line there).  Will am to use left brachial artery access, micropuncture, later today. Discussed risks/benefits with patient and she agrees to procedure.  - Continue statin, ASA, and Plavix.  If no stent needed, will eventually stop ASA/Plavix as she will be on long-term anticoagulation with atrial fibrillation.  - Heparin gtt with Impella.  5. PAD: S/p aortobifemoral bypass on 4/13.  Surgical site is stable, she has had palpable PT pulses.  6. Acute hypoxemic respiratory failure: Intubated with PEA arrest. Resolved, extubated.   7. Atrial fibrillation: Paroxysmal.  She has has been in and out of atrial fibrillation post-op.  Has had episodes of junctional rhythm also.  NSR currently.   - Continue amiodarone gtt 30 mg/hr.  - She is on heparin gtt, will need to transition eventually to Eliquis.  8. Thrombocytopenia: Post-Impella placement. Plts now stable.  LDH stable, does not appear to have significant hemolysis.  9. Diarrhea/abdominal discomfort: Not bloody, WBCs 10.4 w/o fever.  Hopefully not ischemic colitis now that cardiac output is restored. Symptomatic treatment for now.  Improving.   CRITICAL CARE Performed by: Loralie Champagne  Total critical care time: 40 minutes  Critical care time was exclusive of separately billable procedures and treating other patients.  Critical care was necessary to treat or prevent imminent or life-threatening deterioration.  Critical care was time spent personally by me on the following activities: development of treatment plan with patient and/or surrogate as well as nursing, discussions with consultants, evaluation of  patient's response to treatment, examination of patient, obtaining history from patient or surrogate, ordering and performing treatments and interventions, ordering and review of laboratory studies, ordering and review of radiographic studies, pulse oximetry and re-evaluation of patient's condition.   Length of Stay: 9  Loralie Champagne, MD  06/27/2019, 7:26 AM  Advanced Heart Failure Team Pager [phone] (M-F; 7a - 4p)  Please contact [facility] for night-coverage after hours (4p -7a ) and weekends on [email or web address]

## 2019-06-27 NOTE — Interval H&P Note (Signed)
History and Physical Interval Note:  06/27/2019 3:36 PM  Wendy [name]  has presented today for surgery, with the diagnosis of chest pain.  The various methods of treatment have been discussed with the patient and family. After consideration of risks, benefits and other options for treatment, the patient has consented to  Procedure(s): LEFT HEART CATH AND CORONARY ANGIOGRAPHY (N/A) as a surgical intervention.  The patient's history has been reviewed, patient examined, no change in status, stable for surgery.  I have reviewed the patient's chart and labs.  Questions were answered to the patient's satisfaction.     Wendy [name]

## 2019-06-27 NOTE — Progress Notes (Signed)
Order for sheath removal verified per post procedural orders. Procedure explained to patient and left brachial artery access site assessed: level 0, palpable dorsalis pedis and posterior tibial pulses. 6 French Sheath removed and manual pressure applied for 30 minutes. Pre, peri, & post procedural vitals: HR 91, RR 12, O2 Sat 96%, BP 140/64 Pain level 0. Distal pulses remained intact after sheath removal. Access site level 0 and dressed with 4X4 gauze and tegaderm. brian RN confirmed condition of site. Post procedural instructions discussed with return demonstration from patient.

## 2019-06-27 NOTE — Progress Notes (Addendum)
ANTICOAGULATION CONSULT NOTE - Follow-Up Consult  Pharmacy Consult for Heparin Indication: Afib + new Impella  Patient Measurements: Height: 5\' 1"  (154.9 cm) Weight: 75.5 kg (166 lb 7.2 oz) IBW/kg (Calculated) : 47.8  Ideal body weight: 47.8 kg (105 lb 6.1 oz) Adjusted ideal body weight: 58.9 kg (129 lb 12.9 oz)  Heparin Dosing Weight: 65 kg  Vital Signs: Temp: 99.5 F (37.5 C) (04/22 1130) Temp Source: Core (04/22 0800) BP: 134/60 (04/22 1130) Pulse Rate: 76 (04/22 1130)  Labs: Recent Labs     0000 06/24/19 1458 06/24/19 1459 06/24/19 2315 06/25/19 0239 06/25/19 0800 06/25/19 0944 06/25/19 1130 06/26/19 0417 06/26/19 0500 06/26/19 1308 06/26/19 1810 06/27/19 0201 06/27/19 1000  HGB   < > 10.3*   < >  --    < >  --   --   --  10.0*   < >  --  10.3* 10.3*  --   HCT   < > 34.1*   < >  --    < >  --   --   --  31.1*  --   --  31.8* 32.0*  --   PLT   < > 301  --   --    < >  --   --   --  153  --   --  157 149*  --   LABPROT  --  20.8*  --   --   --   --   --   --   --   --   --   --   --   --   INR  --  1.8*  --   --   --   --   --   --   --   --   --   --   --   --   HEPARINUNFRC  --   --   --   --    < >  --   --    < >  --    < >  --  0.14* 0.24* 0.27*  CREATININE   < > 1.33*   < >  --    < >  --   --   --  1.16*  --  1.08*  --  1.21*  --   CKTOTAL  --  86  --   --   --   --   --   --   --   --   --   --   --   --   CKMB  --  11.2*  --   --   --   --   --   --   --   --   --   --   --   --   TROPONINIHS  --  1,802*   < > 2,957*  --  2,749* 2,533*  --   --   --   --   --   --   --    < > = values in this interval not displayed.    Estimated Creatinine Clearance: 42 mL/min (A) (by C-G formula based on SCr of 1.21 mg/dL (H)).   Medications:  Infusions:  . sodium chloride Stopped (06/26/19 0859)  . sodium chloride    . sodium chloride 10 mL/hr at 06/27/19 1012  . amiodarone 30 mg/hr (06/27/19 1100)  . epinephrine Stopped (06/24/19 1719)  . impella catheter  heparin 50 unit/mL  in dextrose 5%    . heparin 900 Units/hr (06/27/19 1100)  . lactated ringers 20 mL/hr at 06/27/19 1100  . nitroGLYCERIN Stopped (06/25/19 1304)    Assessment: 9 YOF who underwent aortobifemoral bypass on 4/13 and was noted to have post-op Afib with RVR and was initiated on Heparin IV. On 4/19 the patient went into respiratory distress requiring intubation, followed by a cardiac arrest. ECHO at the time showed a low EF of 10-15% and the patient was taken emergently to the OR for Impella placement. The patient is now s/p OR and pharmacy is monitor for anticoagulation with Heparin given the Impella 5.5 + Afib.  Systemic heparin was initiated 6 hours postop.  Per protocol, systemic heparin rate not to exceed 1000 units/hr or 10 units/kg/hr unless direct conversation with surgeon  Heparin level remains therapeutic. Systemic heparin is infusing at 900 units/hr and the purge solution is providing 300 units/hr of heparin = total 1200 units/hr.  CBC stable. No bleeding noted.  Goal of Therapy:  Heparin level 0.2-0.5 units/ml Monitor platelets by anticoagulation protocol: Yes   Plan:  - Cont systemic heparin at 900 units/hr - Check heparin level q12h - will transition to 5A/5P - Plan for LHC today and potentially removing Impella tomorrow  Vertis Kelch, PharmD, [facility] PGY2 Cardiology Pharmacy Resident Phone [phone] 06/27/2019       11:48 AM  Please check [email or web address] for unit-specific pharmacist phone numbers

## 2019-06-27 NOTE — Progress Notes (Addendum)
[facility] Progress Note    06/27/2019 7:45 AM 3 Days Post-Op  Subjective:  Says she has some back pain that is similar to her chronic back pain but now in her mid back; having a little pain up under her left breast.  Tm 99.7 now afebrile HR 60's-80's [id number] systolic [id number]  Gtts:   Amiodarone heparin  Vitals:   06/27/19 0726 06/27/19 0730  BP:  122/61  Pulse: 76 78  Resp: 15 14  Temp: 98.8 F (37.1 C) 98.8 F (37.1 C)  SpO2: 95% 97%    Physical Exam: Cardiac:  regular Lungs:  Non labored Abdomen:  Soft, NT/ND Incisions:  Laparotomy incision and bilateral groin incisions look good Extremities:  Palpable DP pulses bilaterally General:  No distress  CBC    Component Value Date/Time   WBC 10.4 06/27/2019 0201   RBC 3.51 (L) 06/27/2019 0201   HGB 10.3 (L) 06/27/2019 0201   HGB 13.4 06/04/2019 1036   HCT 32.0 (L) 06/27/2019 0201   HCT 41.7 06/04/2019 1036   PLT 149 (L) 06/27/2019 0201   PLT 205 06/04/2019 1036   MCV 91.2 06/27/2019 0201   MCV 86 06/04/2019 1036   MCH 29.3 06/27/2019 0201   MCHC 32.2 06/27/2019 0201   RDW 16.6 (H) 06/27/2019 0201   RDW 17.4 (H) 06/04/2019 1036   LYMPHSABS 2.5 06/24/2019 1458   MONOABS 0.4 06/24/2019 1458   EOSABS 0.1 06/24/2019 1458   BASOSABS 0.1 06/24/2019 1458    BMET    Component Value Date/Time   NA 141 06/27/2019 0201   NA 141 06/04/2019 1036   K 3.7 06/27/2019 0201   CL 105 06/27/2019 0201   CO2 27 06/27/2019 0201   GLUCOSE 118 (H) 06/27/2019 0201   BUN 12 06/27/2019 0201   BUN 14 06/04/2019 1036   CREATININE 1.21 (H) 06/27/2019 0201   CREATININE 0.96 08/13/2015 0939   CALCIUM 7.7 (L) 06/27/2019 0201   GFRNONAA 46 (L) 06/27/2019 0201   GFRAA 54 (L) 06/27/2019 0201    INR    Component Value Date/Time   INR 1.8 (H) 06/24/2019 1458     Intake/Output Summary (Last 24 hours) at 06/27/2019 0745 Last data filed at 06/27/2019 0700 Gross per 24 hour  Intake 981.5 ml  Output 4183 ml  Net -3201.5 ml      Assessment/Plan:  69 y.o. female is s/p  Aortobifemoral bypass grafting with subsequent cardiogenic shock now with Impella 9/3 Days Post-Op  -Vascular:  Pt doing well from vascular standpoint-palpable DP pulses bilaterally -Cardiac:  Cardiogenic shock now with Impella that is being weaned.  Pt is for cardiac cath today via left brachial..  Acute CHF with cardiomyopathy with EF of 15-20%.   -Pulmonary:  Pt extubated 2 days ago.  Doing well on [place]  -Neuro:  In tact -Renal:  Creatinine 1.2 with good uop -GI:  Abdomen is soft; +BM -Incisions:  All incisions are healing nicely.  Continue dry gauze to bilateral groins to wick moisture.  -Heme/ID:  hgb stable 10.3 today; platelets stable 149-157 past 3 days; pt with low grade fever past 24 hrs but afebrile now -General:  No distress; c/o back pain that she says is similar to her chronic back pain.   Leontine Locket, PA-C [facility] [phone] 06/27/2019 7:45 AM  I have seen and evaluated the patient. I agree with the PA note as documented above.   Marty Heck, MD [facility] [place] Office: [phone]

## 2019-06-27 NOTE — Progress Notes (Addendum)
ANTICOAGULATION CONSULT NOTE - Follow-Up Consult  Pharmacy Consult for Heparin Indication: Afib + new Impella  Patient Measurements: Height: 5\' 1"  (154.9 cm) Weight: 75.5 kg (166 lb 7.2 oz) IBW/kg (Calculated) : 47.8  Ideal body weight: 47.8 kg (105 lb 6.1 oz) Adjusted ideal body weight: 58.9 kg (129 lb 12.9 oz)  Heparin Dosing Weight: 65 kg  Vital Signs: Temp: 99.9 F (37.7 C) (04/22 1800) Temp Source: Core (04/22 1700) BP: 99/57 (04/22 1800) Pulse Rate: 92 (04/22 1800)  Labs: Recent Labs     0000 06/24/19 2315 06/25/19 0239 06/25/19 0800 06/25/19 0944 06/25/19 1130 06/26/19 0417 06/26/19 0500 06/26/19 1308 06/26/19 1810 06/26/19 1810 06/27/19 0201 06/27/19 1000 06/27/19 1724  HGB   < >  --    < >  --   --   --  10.0*   < >  --  10.3*  --  10.3*  --   --   HCT   < >  --    < >  --   --   --  31.1*  --   --  31.8*  --  32.0*  --   --   PLT   < >  --    < >  --   --   --  153  --   --  157  --  149*  --   --   HEPARINUNFRC  --   --    < >  --   --    < >  --    < >  --  0.14*   < > 0.24* 0.27* 0.21*  CREATININE   < >  --    < >  --   --   --  1.16*  --  1.08*  --   --  1.21*  --   --   TROPONINIHS  --  2,957*  --  2,749* 2,533*  --   --   --   --   --   --   --   --   --    < > = values in this interval not displayed.    Estimated Creatinine Clearance: 42 mL/min (A) (by C-G formula based on SCr of 1.21 mg/dL (H)).  Assessment: 24 YOF who underwent aortobifemoral bypass on 4/13 and was noted to have post-op Afib with RVR and was initiated on Heparin IV. On 4/19 the patient went into respiratory distress requiring intubation, followed by a cardiac arrest. ECHO at the time showed a low EF of 10-15% and the patient was taken emergently to the OR for Impella placement. The patient is now s/p OR and pharmacy is monitor for anticoagulation with Heparin given the Impella 5.5 + Afib.  Per protocol, systemic heparin rate not to exceed 1000 units/hr or 10 units/kg/hr unless  direct conversation with surgeon  Patient went to cath lab earlier this afternoon ~ 1500; sheath removed ~ 1800. Hep lvl of 0.21 was drawn about 2 hrs post cath lab, so probably reflects the gtt being off, some hep given in lab, and the purge through impella.   Systemic heparin was  infusing at 900 units/hr and the purge solution is providing 305 units/hr of heparin = total 1205 units/hr.  CBC stable. No bleeding noted.  Goal of Therapy:  Heparin level 0.2-0.5 units/ml Monitor platelets by anticoagulation protocol: Yes   Plan:  - resume heparin at 900 units/hr - recheck at [place], PharmD, BCPS, BCCCP  Clinical Pharmacist [phone]  Please check AMION for all [place] numbers  06/27/2019 7:03 PM

## 2019-06-28 ENCOUNTER — Inpatient Hospital Stay (HOSPITAL_COMMUNITY): Payer: Medicare HMO | Admitting: Anesthesiology

## 2019-06-28 ENCOUNTER — Inpatient Hospital Stay (HOSPITAL_COMMUNITY): Payer: Medicare HMO

## 2019-06-28 ENCOUNTER — Encounter (HOSPITAL_COMMUNITY)
Admission: RE | Disposition: A | Discharge status: Skilled Nursing Facility | Payer: Self-pay | Source: Home / Self Care | Attending: Vascular Surgery

## 2019-06-28 DIAGNOSIS — R570 Cardiogenic shock: Secondary | ICD-10-CM

## 2019-06-28 HISTORY — PX: REMOVAL OF IMPELLA LEFT VENTRICULAR ASSIST DEVICE: SHX6556

## 2019-06-28 LAB — GLUCOSE, CAPILLARY
Glucose-Capillary: 102 mg/dL — ABNORMAL HIGH (ref 70–99)
Glucose-Capillary: 130 mg/dL — ABNORMAL HIGH (ref 70–99)
Glucose-Capillary: 137 mg/dL — ABNORMAL HIGH (ref 70–99)
Glucose-Capillary: 77 mg/dL (ref 70–99)
Glucose-Capillary: 80 mg/dL (ref 70–99)
Glucose-Capillary: 97 mg/dL (ref 70–99)

## 2019-06-28 LAB — COMPREHENSIVE METABOLIC PANEL
ALT: 16 U/L (ref 0–44)
AST: 17 U/L (ref 15–41)
Albumin: 1.8 g/dL — ABNORMAL LOW (ref 3.5–5.0)
Alkaline Phosphatase: 61 U/L (ref 38–126)
Anion gap: 8 (ref 5–15)
BUN: 8 mg/dL (ref 8–23)
CO2: 26 mmol/L (ref 22–32)
Calcium: 7.4 mg/dL — ABNORMAL LOW (ref 8.9–10.3)
Chloride: 105 mmol/L (ref 98–111)
Creatinine, Ser: 1 mg/dL (ref 0.44–1.00)
GFR calc Af Amer: >60 mL/min (ref >60)
GFR calc non Af Amer: 58 mL/min — ABNORMAL LOW (ref >60)
Glucose, Bld: 134 mg/dL — ABNORMAL HIGH (ref 70–99)
Potassium: 3.6 mmol/L (ref 3.5–5.1)
Sodium: 139 mmol/L (ref 135–145)
Total Bilirubin: 0.4 mg/dL (ref 0.3–1.2)
Total Protein: 4.3 g/dL — ABNORMAL LOW (ref 6.5–8.1)

## 2019-06-28 LAB — LACTATE DEHYDROGENASE: LDH: 262 U/L — ABNORMAL HIGH (ref 98–192)

## 2019-06-28 LAB — COOXEMETRY PANEL
Carboxyhemoglobin: 1.4 % (ref 0.5–1.5)
Carboxyhemoglobin: 2.2 % — ABNORMAL HIGH (ref 0.5–1.5)
Methemoglobin: 1.3 % (ref 0.0–1.5)
Methemoglobin: 1.1 % (ref 0.0–1.5)
O2 Saturation: 48.5 %
O2 Saturation: 88.1 %
Total hemoglobin: 9.6 g/dL — ABNORMAL LOW (ref 12.0–16.0)
Total hemoglobin: 8.2 g/dL — ABNORMAL LOW (ref 12.0–16.0)

## 2019-06-28 LAB — SURGICAL PCR SCREEN
MRSA, PCR: POSITIVE — AB
Staphylococcus aureus: POSITIVE — AB

## 2019-06-28 LAB — HEPARIN LEVEL (UNFRACTIONATED): Heparin Unfractionated: 0.32 IU/mL (ref 0.30–0.70)

## 2019-06-28 SURGERY — REMOVAL, CARDIAC ASSIST DEVICE, IMPELLA
Anesthesia: General | Site: Axilla | Laterality: Right

## 2019-06-28 MED ORDER — ROCURONIUM BROMIDE 10 MG/ML (PF) SYRINGE
PREFILLED_SYRINGE | INTRAVENOUS | Status: DC | PRN
  Administered 2019-06-28: 30 mg via INTRAVENOUS
  Administered 2019-06-28: 20 mg via INTRAVENOUS

## 2019-06-28 MED ORDER — CHLORHEXIDINE GLUCONATE CLOTH 2 % EX PADS
6.0000 | MEDICATED_PAD | Freq: Every day | CUTANEOUS | Status: AC
  Administered 2019-06-28 – 2019-07-02 (×5): 6 via TOPICAL

## 2019-06-28 MED ORDER — ETOMIDATE 2 MG/ML IV SOLN
INTRAVENOUS | Status: DC | PRN
  Administered 2019-06-28: 10 mg via INTRAVENOUS

## 2019-06-28 MED ORDER — PHENYLEPHRINE HCL-NACL 10-0.9 MG/250ML-% IV SOLN
INTRAVENOUS | Status: DC | PRN
  Administered 2019-06-28: 25 ug/min via INTRAVENOUS

## 2019-06-28 MED ORDER — MUPIROCIN 2 % EX OINT
1.0000 "application " | TOPICAL_OINTMENT | Freq: Two times a day (BID) | CUTANEOUS | Status: AC
  Administered 2019-06-28 – 2019-07-02 (×10): 1 via NASAL
  Filled 2019-06-28 (×2): qty 22

## 2019-06-28 MED ORDER — LIDOCAINE 2% (20 MG/ML) 5 ML SYRINGE
INTRAMUSCULAR | Status: AC
  Filled 2019-06-28: qty 5

## 2019-06-28 MED ORDER — SUGAMMADEX SODIUM 200 MG/2ML IV SOLN
INTRAVENOUS | Status: DC | PRN
  Administered 2019-06-28: 200 mg via INTRAVENOUS

## 2019-06-28 MED ORDER — FENTANYL CITRATE (PF) 250 MCG/5ML IJ SOLN
INTRAMUSCULAR | Status: AC
  Filled 2019-06-28: qty 15

## 2019-06-28 MED ORDER — ROCURONIUM BROMIDE 10 MG/ML (PF) SYRINGE
PREFILLED_SYRINGE | INTRAVENOUS | Status: AC
  Filled 2019-06-28: qty 10

## 2019-06-28 MED ORDER — PROPOFOL 10 MG/ML IV BOLUS
INTRAVENOUS | Status: AC
  Filled 2019-06-28: qty 20

## 2019-06-28 MED ORDER — POTASSIUM CHLORIDE CRYS ER 20 MEQ PO TBCR
40.0000 meq | EXTENDED_RELEASE_TABLET | Freq: Once | ORAL | Status: AC
  Administered 2019-06-28: 40 meq via ORAL
  Filled 2019-06-28: qty 2

## 2019-06-28 MED ORDER — DEXAMETHASONE SODIUM PHOSPHATE 4 MG/ML IJ SOLN
INTRAMUSCULAR | Status: DC | PRN
  Administered 2019-06-28: 5 mg via INTRAVENOUS

## 2019-06-28 MED ORDER — MIDAZOLAM HCL 5 MG/5ML IJ SOLN
INTRAMUSCULAR | Status: DC | PRN
  Administered 2019-06-28: 1 mg via INTRAVENOUS

## 2019-06-28 MED ORDER — VANCOMYCIN HCL 1000 MG IV SOLR
INTRAVENOUS | Status: DC | PRN
  Administered 2019-06-28: 1000 mg via INTRAVENOUS

## 2019-06-28 MED ORDER — ALPRAZOLAM 0.25 MG PO TABS: 0.2500 mg | ORAL_TABLET | Freq: Every evening | ORAL | Status: DC | PRN

## 2019-06-28 MED ORDER — FENTANYL CITRATE (PF) 250 MCG/5ML IJ SOLN
INTRAMUSCULAR | Status: DC | PRN
  Administered 2019-06-28: 50 ug via INTRAVENOUS

## 2019-06-28 MED ORDER — MIDAZOLAM HCL (PF) 10 MG/2ML IJ SOLN
INTRAMUSCULAR | Status: AC
  Filled 2019-06-28: qty 2

## 2019-06-28 MED ORDER — LACTATED RINGERS IV SOLN: INTRAVENOUS | Status: DC | PRN

## 2019-06-28 MED ORDER — ALPRAZOLAM 0.25 MG PO TABS
0.2500 mg | ORAL_TABLET | Freq: Three times a day (TID) | ORAL | Status: DC | PRN
  Administered 2019-06-28 – 2019-07-02 (×11): 0.25 mg via ORAL
  Filled 2019-06-28 (×11): qty 1

## 2019-06-28 MED ORDER — SACUBITRIL-VALSARTAN 24-26 MG PO TABS
1.0000 | ORAL_TABLET | Freq: Two times a day (BID) | ORAL | Status: DC
  Administered 2019-06-28 – 2019-06-30 (×5): 1 via ORAL
  Filled 2019-06-28 (×6): qty 1

## 2019-06-28 MED ORDER — 0.9 % SODIUM CHLORIDE (POUR BTL) OPTIME
TOPICAL | Status: DC | PRN
  Administered 2019-06-28: 2000 mL

## 2019-06-28 MED ORDER — ETOMIDATE 2 MG/ML IV SOLN
INTRAVENOUS | Status: AC
  Filled 2019-06-28: qty 10

## 2019-06-28 SURGICAL SUPPLY — 38 items
ADH SKN CLS APL DERMABOND .7 (GAUZE/BANDAGES/DRESSINGS) ×2
BLADE CLIPPER SURG (BLADE) ×2 IMPLANT
CLIP VESOCCLUDE LG 6/CT (CLIP) ×3 IMPLANT
COVER SURGICAL LIGHT HANDLE (MISCELLANEOUS) ×1 IMPLANT
DERMABOND ADVANCED (GAUZE/BANDAGES/DRESSINGS) ×1
DERMABOND ADVANCED .7 DNX12 (GAUZE/BANDAGES/DRESSINGS) IMPLANT
DRAIN HEMOVAC 1/8 X 5 (WOUND CARE) IMPLANT
DRAPE SLUSH/WARMER DISC (DRAPES) ×3 IMPLANT
EVACUATOR SILICONE 100CC (DRAIN) IMPLANT
GAUZE PACKING IODOFORM 1/4X15 (GAUZE/BANDAGES/DRESSINGS) ×1 IMPLANT
GAUZE SPONGE 4X4 12PLY STRL (GAUZE/BANDAGES/DRESSINGS) ×1 IMPLANT
GLOVE BIOGEL PI IND STRL 6.5 (GLOVE) IMPLANT
GLOVE BIOGEL PI INDICATOR 6.5 (GLOVE) ×3
GLOVE NEODERM STRL 7.5  LF PF (GLOVE) ×2
GLOVE NEODERM STRL 7.5 LF PF (GLOVE) ×2 IMPLANT
GLOVE SURG NEODERM 7.5  LF PF (GLOVE) ×1
GOWN STRL REUS W/ TWL LRG LVL3 (GOWN DISPOSABLE) ×4 IMPLANT
GOWN STRL REUS W/TWL LRG LVL3 (GOWN DISPOSABLE) ×9
INSERT FOGARTY SM (MISCELLANEOUS) ×2 IMPLANT
KIT BASIN OR (CUSTOM PROCEDURE TRAY) ×3 IMPLANT
NS IRRIG 1000ML POUR BTL (IV SOLUTION) ×6 IMPLANT
PACK GENERAL/GYN (CUSTOM PROCEDURE TRAY) ×3 IMPLANT
PACK UNIVERSAL I (CUSTOM PROCEDURE TRAY) ×3 IMPLANT
PAD ARMBOARD 7.5X6 YLW CONV (MISCELLANEOUS) ×6 IMPLANT
PAD ELECT DEFIB RADIOL ZOLL (MISCELLANEOUS) ×3 IMPLANT
STAPLER VISISTAT 35W (STAPLE) IMPLANT
SUT ETHILON 3 0 PS 1 (SUTURE) IMPLANT
SUT MNCRL AB 4-0 PS2 18 (SUTURE) ×3 IMPLANT
SUT PROLENE 4 0 RB 1 (SUTURE)
SUT PROLENE 4-0 RB1 .5 CRCL 36 (SUTURE) ×2 IMPLANT
SUT SILK  1 MH (SUTURE)
SUT SILK 1 MH (SUTURE) ×8 IMPLANT
SUT SILK 1 TIES 10X30 (SUTURE) ×2 IMPLANT
SUT VIC AB 2-0 CT1 36 (SUTURE) ×3 IMPLANT
TAPE PAPER 2X10 WHT MICROPORE (GAUZE/BANDAGES/DRESSINGS) ×1 IMPLANT
TOWEL GREEN STERILE (TOWEL DISPOSABLE) ×3 IMPLANT
TOWEL GREEN STERILE FF (TOWEL DISPOSABLE) ×2 IMPLANT
WATER STERILE IRR 1000ML POUR (IV SOLUTION) ×6 IMPLANT

## 2019-06-28 NOTE — Progress Notes (Signed)
ANTICOAGULATION CONSULT NOTE - Follow-Up Consult  Pharmacy Consult for Heparin Indication: Afib + new Impella  Patient Measurements: Height: 5\' 1"  (154.9 cm) Weight: 75.5 kg (166 lb 7.2 oz) IBW/kg (Calculated) : 47.8  Ideal body weight: 47.8 kg (105 lb 6.1 oz) Adjusted ideal body weight: 58.9 kg (129 lb 12.9 oz)  Heparin Dosing Weight: 65 kg  Vital Signs: Temp: 99.9 F (37.7 C) (04/22 1800) Temp Source: Core (04/22 1700) BP: 99/57 (04/22 1800) Pulse Rate: 92 (04/22 1800)  Labs: Recent Labs    06/25/19 0800 06/25/19 0944 06/25/19 1130 06/26/19 0417 06/26/19 1308 06/26/19 1810 06/26/19 1810 06/27/19 0201 06/27/19 0201 06/27/19 1000 06/27/19 1724 06/27/19 2232 06/28/19 0229  HGB  --   --    < >   < >  --  10.3*   < > 10.3*  --   --   --  9.1*  --   HCT  --   --    < >   < >  --  31.8*  --  32.0*  --   --   --  29.2*  --   PLT  --   --    < >   < >  --  157  --  149*  --   --   --  153  --   HEPARINUNFRC  --   --    < >   < >  --  0.14*   < > 0.24*   < > 0.27* 0.21*  --  0.32  CREATININE  --   --    < >  --  1.08*  --   --  1.21*  --   --   --   --  1.00  TROPONINIHS 2,749* 2,533*  --   --   --   --   --   --   --   --   --   --   --    < > = values in this interval not displayed.    Estimated Creatinine Clearance: 50.8 mL/min (by C-G formula based on SCr of 1 mg/dL).   Medications:  Infusions:  . sodium chloride Stopped (06/26/19 0859)  . sodium chloride    . amiodarone 30 mg/hr (06/27/19 1900)  . epinephrine Stopped (06/24/19 1719)  . impella catheter heparin 50 unit/mL in dextrose 5% Stopped (06/27/19 1518)  . heparin 900 Units/hr (06/27/19 1948)  . lactated ringers 20 mL/hr at 06/27/19 1300  . nitroGLYCERIN Stopped (06/25/19 1304)    Assessment: 52 YOF who underwent aortobifemoral bypass on 4/13 and was noted to have post-op Afib with RVR and was initiated on Heparin IV. On 4/19 the patient went into respiratory distress requiring intubation, followed by a  cardiac arrest. ECHO at the time showed a low EF of 10-15% and the patient was taken emergently to the OR for Impella placement. The patient is now s/p OR and pharmacy is monitor for anticoagulation with Heparin given the Impella 5.5 + Afib.  Systemic heparin was initiated 6 hours postop.   Per protocol, systemic heparin rate not to exceed 1000 units/hr or 10 units/kg/hr unless direct conversation with surgeon  4/23 AM update:  Heparin level therapeutic this AM   Goal of Therapy:  Heparin level 0.2-0.5 units/ml Monitor platelets by anticoagulation protocol: Yes   Plan:  - Cont heparin at 900 units/hr - Heparin level q12h  Narda Bonds, PharmD, BCPS Clinical Pharmacist Phone: [phone]

## 2019-06-28 NOTE — Progress Notes (Addendum)
Progress Note    06/28/2019 7:35 AM POD 10  Subjective: She is complaining of mild epigastric lower chest discomfort.  Denies nausea or vomiting.  Says she needs to have a bowel movement.  Cardiac catheterization yesterday via left brachial artery.  Diuresed with Lasix yesterday.   Vitals:   06/28/19 0630 06/28/19 0700  BP: (!) 102/56 (!) 91/52  Pulse: 63 60  Resp: 15 15  Temp: 98.2 F (36.8 C) 97.9 F (36.6 C)  SpO2: 93% (!) 89%    Physical Exam: Cardiac: Rate rhythm regular Lungs: Clear to auscultation bilaterally Incisions: Midline incision of both groin incisions are well approximated.  I do not appreciate any drainage from either groin Extremities: Intact sensation and active range of motion of both feet.  Palpable 2+ dorsalis pedis pulses bilaterally.  Bilateral 5 out of 5 hand grip strength.  Left upper arm with mild to moderate ecchymosis but soft and no evidence of hematoma. Abdomen: Soft, nondistended, active bowel sounds  CBC    Component Value Date/Time   WBC 11.5 (H) 06/27/2019 2232   RBC 3.15 (L) 06/27/2019 2232   HGB 9.1 (L) 06/27/2019 2232   HGB 13.4 06/04/2019 1036   HCT 29.2 (L) 06/27/2019 2232   HCT 41.7 06/04/2019 1036   PLT 153 06/27/2019 2232   PLT 205 06/04/2019 1036   MCV 92.7 06/27/2019 2232   MCV 86 06/04/2019 1036   MCH 28.9 06/27/2019 2232   MCHC 31.2 06/27/2019 2232   RDW 16.9 (H) 06/27/2019 2232   RDW 17.4 (H) 06/04/2019 1036   LYMPHSABS 2.5 06/24/2019 1458   MONOABS 0.4 06/24/2019 1458   EOSABS 0.1 06/24/2019 1458   BASOSABS 0.1 06/24/2019 1458    BMET    Component Value Date/Time   NA 139 06/28/2019 0229   NA 141 06/04/2019 1036   K 3.6 06/28/2019 0229   CL 105 06/28/2019 0229   CO2 26 06/28/2019 0229   GLUCOSE 134 (H) 06/28/2019 0229   BUN 8 06/28/2019 0229   BUN 14 06/04/2019 1036   CREATININE 1.00 06/28/2019 0229   CREATININE 0.96 08/13/2015 0939   CALCIUM 7.4 (L) 06/28/2019 0229   GFRNONAA 58 (L) 06/28/2019 0229     GFRAA >60 06/28/2019 0229     Intake/Output Summary (Last 24 hours) at 06/28/2019 0735 Last data filed at 06/28/2019 0700 Gross per 24 hour  Intake 1389 ml  Output 2235 ml  Net -846 ml    HOSPITAL MEDICATIONS Scheduled Meds: . sodium chloride   Intravenous Once  . aspirin  81 mg Oral Daily  . atorvastatin  80 mg Oral Daily  . buPROPion  150 mg Oral Daily  . Chlorhexidine Gluconate Cloth  6 each Topical Daily  . clopidogrel  75 mg Oral Daily  . digoxin  0.125 mg Oral Daily  . ezetimibe  10 mg Oral Daily  . insulin aspart  0-9 Units Subcutaneous Q4H  . LORazepam  0.5 mg Intravenous Once  . mouth rinse  15 mL Mouth Rinse BID  . pantoprazole  40 mg Oral Daily  . sacubitril-valsartan  1 tablet Oral BID  . sertraline  150 mg Oral QHS  . sodium chloride flush  10-40 mL Intracatheter Q12H  . sodium chloride flush  10-40 mL Intracatheter Q12H  . sodium chloride flush  3 mL Intravenous Q12H  . sodium chloride flush  3 mL Intravenous Q12H  . spironolactone  25 mg Oral Daily   Continuous Infusions: . sodium chloride Stopped (06/26/19 0859)  .  sodium chloride    . amiodarone 30 mg/hr (06/28/19 0600)  . epinephrine Stopped (06/24/19 1719)  . impella catheter heparin 50 unit/mL in dextrose 5% Stopped (06/27/19 1518)  . heparin 900 Units/hr (06/28/19 0600)  . lactated ringers 20 mL/hr at 06/27/19 1300  . nitroGLYCERIN Stopped (06/25/19 1304)   PRN Meds:.sodium chloride, sodium chloride, acetaminophen **OR** acetaminophen, ALPRAZolam, alum & mag hydroxide-simeth, diphenhydrAMINE **OR** diphenhydrAMINE, docusate sodium, fentaNYL (SUBLIMAZE) injection, gabapentin, guaiFENesin-dextromethorphan, metoprolol tartrate, ondansetron, oxyCODONE-acetaminophen, phenol, polyethylene glycol, sodium chloride flush, sodium chloride flush, sodium chloride flush  Assessment:  69 y.o. female is s/p: Cardiac catheterization yesterday: 1. 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 Moderate calcific proximal LAD stenosis unchanged from prior cath study 4. Distal RCA and LCx territories collateralized by branches of the LAD  Stable coronary anatomy - continue medical therapy/supportive measures   She is postoperative day 10 aortobifemoral bypass.  Lower extremities well perfused.  Hemodynamically stable.  2.3 L urine output yesterday.  Hemoglobin stable and creatinine 1.0 today   Postoperative course complicated by atrial fibrillation with onset of RVR.  PEA arrest status post Impella placement.  Continues on heparin and amiodarone infusion.  -DVT prophylaxis: On heparin infusion   Risa Grill, PA-C [facility] [phone] 06/28/2019  7:35 AM   I have independently interviewed and examined patient and agree with PA assessment and plan above.  Care of this patient from consulting services much appreciated.  Noted plans for possible removal of Impella today.  Her groin wounds do appear to be healing well and I do not see any drainage.  Impella and Luiz Blare are removed patient should be good for out of bed.  We will transition to Eliquis when all procedures have completed we will continue heparin drip for now.  Chitara Clonch C. Donzetta Matters, MD [facility] Office: [phone] Pager: [phone]

## 2019-06-28 NOTE — Anesthesia Preprocedure Evaluation (Addendum)
Anesthesia Evaluation  Patient identified by MRN, date of birth, ID band Patient awake    Reviewed: Allergy & Precautions, H&P , NPO status , Patient's Chart, lab work & pertinent test results, reviewed documented beta blocker date and time   History of Anesthesia Complications Negative for: history of anesthetic complications  Airway Mallampati: II  TM Distance: >3 FB Neck ROM: Full    Dental no notable dental hx. (+) Edentulous Lower, Dental Advisory Given   Pulmonary neg shortness of breath, asthma , sleep apnea , COPD,  COPD inhaler, former smoker,    Pulmonary exam normal breath sounds clear to auscultation       Cardiovascular hypertension, Pt. on medications and Pt. on home beta blockers + angina + CAD, + Past MI and + Peripheral Vascular Disease   Rhythm:Regular Rate:Normal  impella right axillary   Neuro/Psych  Headaches, PSYCHIATRIC DISORDERS Anxiety Depression    GI/Hepatic Neg liver ROS, GERD  Medicated and Controlled,  Endo/Other  negative endocrine ROS  Renal/GU negative Renal ROS  negative genitourinary   Musculoskeletal   Abdominal   Peds  Hematology  (+) Blood dyscrasia, anemia ,   Anesthesia Other Findings   Reproductive/Obstetrics negative OB ROS                           Anesthesia Physical Anesthesia Plan  ASA: IV  Anesthesia Plan: General   Post-op Pain Management:    Induction: Intravenous  PONV Risk Score and Plan: 3 and Ondansetron and Treatment may vary due to age or medical condition  Airway Management Planned: Oral ETT  Additional Equipment:   Intra-op Plan:   Post-operative Plan: Extubation in OR  Informed Consent: I have reviewed the patients History and Physical, chart, labs and discussed the procedure including the risks, benefits and alternatives for the proposed anesthesia with the patient or authorized representative who has indicated his/her  understanding and acceptance.     Dental advisory given  Plan Discussed with: CRNA  Anesthesia Plan Comments:        Anesthesia Quick Evaluation

## 2019-06-28 NOTE — Progress Notes (Signed)
PT Cancellation Note  Patient Details Name: Wendy [name] MRN: [id number] DOB: 02-08-1951   Cancelled Treatment:    Reason Eval/Treat Not Completed: Patient at procedure or test/unavailable. Pt undergoing removal of impella in OR. Will follow up on Monday.   [place] 06/28/2019, 1:45 PM [place] Pager [phone] Office [phone]

## 2019-06-28 NOTE — Progress Notes (Signed)
Patient ID: Wendy [name], female   DOB: [birth year], 69 y.o.   MRN: [id number]  TCTS Evening Rounds:   Hemodynamically stable  Impella removal site dry.

## 2019-06-28 NOTE — Anesthesia Postprocedure Evaluation (Signed)
Anesthesia Post Note  Patient: Wendy [name]  Procedure(s) Performed: REMOVAL OF IMPELLA LEFT VENTRICULAR ASSIST DEVICE (Right Axilla)     Patient location during evaluation: ICU Anesthesia Type: General Level of consciousness: awake and alert Pain management: pain level controlled Vital Signs Assessment: post-procedure vital signs reviewed and stable Respiratory status: spontaneous breathing, nonlabored ventilation, respiratory function stable and patient connected to nasal cannula oxygen Cardiovascular status: blood pressure returned to baseline and stable Postop Assessment: no apparent nausea or vomiting Anesthetic complications: no    Last Vitals:  Vitals:   06/28/19 1030 06/28/19 1100  BP: (!) 121/55 (!) 110/57  Pulse: 62 60  Resp: 16 14  Temp: 36.6 C 36.5 C  SpO2: 94% 94%    Last Pain:  Vitals:   06/28/19 1500  TempSrc:   PainSc: 7                  Jacoby Zanni,W. EDMOND

## 2019-06-28 NOTE — Transfer of Care (Signed)
Immediate Anesthesia Transfer of Care Note  Patient: Wendy [name]  Procedure(s) Performed: REMOVAL OF IMPELLA LEFT VENTRICULAR ASSIST DEVICE (Right Axilla)  Patient Location: ICU  Anesthesia Type:General  Level of Consciousness: awake, alert , oriented and patient cooperative  Airway & Oxygen Therapy: Patient Spontanous Breathing and Patient connected to face mask oxygen  Post-op Assessment: Report given to RN and Post -op Vital signs reviewed and stable  Post vital signs: Reviewed and stable  Last Vitals:  Vitals Value Taken Time  BP    Temp    Pulse    Resp    SpO2      Last Pain:  Vitals:   06/28/19 0800  TempSrc: Core  PainSc: 0-No pain      Patients Stated Pain Goal: 2 ([id number])  Complications: No apparent anesthesia complications   Transported to unit with full monitors. SR on monitor.  Pulse ox probe picking up intermittently.  90's.  RR even and unlabored. Denies any sob/difficulty breathing.

## 2019-06-28 NOTE — Progress Notes (Signed)
Patient ID: Wendy [name], female   DOB: 1951/01/31, 69 y.o.   MRN: [id number]     Advanced Heart Failure Rounding Note  PCP-Cardiologist: [name], MD   Subjective:    - 4/13 aortobifemoral bypass - Impella 5.5 placement 4/19 for cardiogenic shock.  - Extubated 4/20 - LHC (left brachial access): All grafts occluded (known), totally occluded RCA, OM1, OM3, 80% dLCx, 60% pLAD.  No change, no intervention.   She is stable hemodynamically this morning.  She remains on amiodarone 30 mg/hr, had atrial fibrillation last night but back in NSR today. No Lasix yesterday, I/Os negative and weight down.   No further abdominal pain or diarrhea, chest still sore from CPR.  Slept poorly and got Xanax this morning, now drowsy but will awaken and interact.   Swan numbers: CVP 7-8 PA 34/14 CI 2.44 Co-ox 49% (drawn early am)  Impella: P4, flow 2.7 L/min.  No alarms. LDH 262  Objective:   Weight Range: 73 kg Body mass index is 30.41 kg/m.   Vital Signs:   Temp:  [97.9 F (36.6 C)-100 F (37.8 C)] 97.9 F (36.6 C) (04/23 0700) Pulse Rate:  [38-106] 60 (04/23 0700) Resp:  [13-23] 15 (04/23 0700) BP: (86-163)/(49-75) 91/52 (04/23 0700) SpO2:  [89 %-100 %] 89 % (04/23 0700) Weight:  [73 kg] 73 kg (04/23 0527) Last BM Date: 06/28/19  Weight change: Filed Weights   06/26/19 0615 06/27/19 0500 06/28/19 0527  Weight: 78.3 kg 75.5 kg 73 kg    Intake/Output:   Intake/Output Summary (Last 24 hours) at 06/28/2019 0735 Last data filed at 06/28/2019 0700 Gross per 24 hour  Intake 1389 ml  Output 2235 ml  Net -846 ml      Physical Exam    General: NAD Neck: No JVD, no thyromegaly or thyroid nodule.  Lungs: Clear to auscultation bilaterally with normal respiratory effort. CV: Nondisplaced PMI.  Heart regular S1/S2, no S3/S4, no murmur, Impella sounds.  No peripheral edema.   Abdomen: Soft, nontender, no hepatosplenomegaly, no distention.  Skin: Intact without lesions or rashes.    Neurologic: Alert and oriented x 3.  Psych: Normal affect. Extremities: No clubbing or cyanosis. Left arm warm, left radial pulse is dopplerable.  HEENT: Normal.    Telemetry   NSR 70s (personally reviewed)  Labs    CBC Recent Labs    06/27/19 0201 06/27/19 2232  WBC 10.4 11.5*  HGB 10.3* 9.1*  HCT 32.0* 29.2*  MCV 91.2 92.7  PLT 149* 0000000   Basic Metabolic Panel Recent Labs    06/26/19 0417 06/26/19 1308 06/27/19 0201 06/28/19 0229  NA 144   < > 141 139  K 2.9*   < > 3.7 3.6  CL 109   < > 105 105  CO2 26   < > 27 26  GLUCOSE 134*   < > 118* 134*  BUN 13   < > 12 8  CREATININE 1.16*   < > 1.21* 1.00  CALCIUM 7.8*   < > 7.7* 7.4*  MG 1.6*  --   --   --    < > = values in this interval not displayed.   Liver Function Tests Recent Labs    06/27/19 0201 06/28/19 0229  AST 21 17  ALT 23 16  ALKPHOS 69 61  BILITOT 0.9 0.4  PROT 4.6* 4.3*  ALBUMIN 2.1* 1.8*   No results for input(s): LIPASE, AMYLASE in the last 72 hours. Cardiac Enzymes No results for input(s):  CKTOTAL, CKMB, CKMBINDEX, TROPONINI in the last 72 hours.  BNP: BNP (last 3 results) Recent Labs    06/19/19 1239  BNP 366.7*    ProBNP (last 3 results) No results for input(s): PROBNP in the last 8760 hours.   D-Dimer No results for input(s): DDIMER in the last 72 hours. Hemoglobin A1C No results for input(s): HGBA1C in the last 72 hours. Fasting Lipid Panel No results for input(s): CHOL, HDL, LDLCALC, TRIG, CHOLHDL, LDLDIRECT in the last 72 hours. Thyroid Function Tests No results for input(s): TSH, T4TOTAL, T3FREE, THYROIDAB in the last 72 hours.  Invalid input(s): FREET3  Other results:   Imaging    CARDIAC CATHETERIZATION  Result Date: 06/27/2019 1. Severe multivessel CAD with total occlusion of the RCA, first diagonal, and first OM branches 2. 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 Moderate calcific proximal LAD stenosis unchanged from prior cath study 4.  Distal RCA and LCx territories collateralized by branches of the LAD Stable coronary anatomy - continue medical therapy/supportive measures    Medications:     Scheduled Medications: . sodium chloride   Intravenous Once  . aspirin  81 mg Oral Daily  . atorvastatin  80 mg Oral Daily  . buPROPion  150 mg Oral Daily  . Chlorhexidine Gluconate Cloth  6 each Topical Daily  . clopidogrel  75 mg Oral Daily  . digoxin  0.125 mg Oral Daily  . ezetimibe  10 mg Oral Daily  . insulin aspart  0-9 Units Subcutaneous Q4H  . LORazepam  0.5 mg Intravenous Once  . mouth rinse  15 mL Mouth Rinse BID  . pantoprazole  40 mg Oral Daily  . sacubitril-valsartan  1 tablet Oral BID  . sertraline  150 mg Oral QHS  . sodium chloride flush  10-40 mL Intracatheter Q12H  . sodium chloride flush  10-40 mL Intracatheter Q12H  . sodium chloride flush  3 mL Intravenous Q12H  . sodium chloride flush  3 mL Intravenous Q12H  . spironolactone  25 mg Oral Daily    Infusions: . sodium chloride Stopped (06/26/19 0859)  . sodium chloride    . amiodarone 30 mg/hr (06/28/19 0600)  . epinephrine Stopped (06/24/19 1719)  . impella catheter heparin 50 unit/mL in dextrose 5% Stopped (06/27/19 1518)  . heparin 900 Units/hr (06/28/19 0600)  . lactated ringers 20 mL/hr at 06/27/19 1300  . nitroGLYCERIN Stopped (06/25/19 1304)    PRN Medications: sodium chloride, sodium chloride, acetaminophen **OR** acetaminophen, ALPRAZolam, alum & mag hydroxide-simeth, diphenhydrAMINE **OR** diphenhydrAMINE, docusate sodium, fentaNYL (SUBLIMAZE) injection, gabapentin, guaiFENesin-dextromethorphan, metoprolol tartrate, ondansetron, oxyCODONE-acetaminophen, phenol, polyethylene glycol, sodium chloride flush, sodium chloride flush, sodium chloride flush   Assessment/Plan   1. Cardiogenic shock: Progressive worsening 4/19 culminating in PEA arrest.  Lactate 8.1.  She had episodes of hypotension and atrial fibrillation/RVR earlier in her  post-op course as well.  Symptoms were dyspnea and anxiety no chest pain.  No STEMI on ECG but concern for possible coronary event based on history.  Initial troponin 1802 => 2957.  Initially on NE, epi then Impella 5.5 placed on 4/19.  Now MAP is stable and off pressors.  Lactate has normalized.  2. Acute systolic CHF: Suspect ischemic cardiomyopathy.  Echo today with EF 15-20%, diffuse hypokinesis, preserved RV function.  Pre-op echo with EF 60%.  HS-TnI 1802 => 2957, elevated but not markedly high.  [place] 4/22 showed unchanged coronary anatomy, suspect patient got ischemic with hypotension. This morning, good CI off Luiz Blare  with no Impella alarms.  Co-ox low but drawn early am. CVP 7-8 this morning. LDH and platelets stable.  - Repeat co-ox now, does not match thermodilution CI.  - Wean Impella to P3 today. She remains on heparin gtt for Impella.  Discussed with Dr. Orvan Seen, will likely remove Impella later today.  - No Lasix today with CVP 7-8 and weight down.  - SBP 100s (after Xanax), will decrease Entresto to 24/26 bid for now.   - Continue digoxin  - Continue spironolactone 25 mg daily.   - Replace K.  3. AKI: Creatinine as high as 2.86, started trending down and is 1.0 today.  Follow closely.  4. CAD: LHC in 3/21 showed occluded SVG-RCA and SVG-OM and atretic LIMA-LAD.  The LAD was patent with 60% mid vessel stenosis, the RCA was occluded with collaterals, and OM1 and OM2 were occluded.  No intervention.  Surprisingly, EF reported as normal pre-op.  No chest pain but had dyspnea and anxiety today.  ECG did not show STEMI, showed fairly stable lateral TWIs.  HS-TnI to 2957. [place] 4/22 showed unchanged coronary anatomy, suspect she was ischemic with hypotension.  No interventional target.  Left brachial site benign and radial pulse can be faintly dopplered.  - Continue statin, ASA, and Plavix for now. Will eventually stop ASA/Plavix as she will need long-term oral anticoagulation (Eliquis) unless vascular  wants an antiplatelet agent post-aortobifemoral bypass.  - Heparin gtt with Impella.  5. PAD: S/p aortobifemoral bypass on 4/13.  Surgical site is stable, she has had palpable PT pulses.  6. Acute hypoxemic respiratory failure: Intubated with PEA arrest. Resolved, extubated.  Chest still sore from CPR.  7. Atrial fibrillation: Paroxysmal.  She has has been in and out of atrial fibrillation post-op.  Has had episodes of junctional rhythm also.  NSR currently.   - Continue amiodarone gtt 30 mg/hr for one more day as she had some afib last night, to po tomorrow.  - She is on heparin gtt, will need to transition eventually to Eliquis.  8. Thrombocytopenia: Post-Impella placement. Plts now stable.  LDH stable, does not appear to have significant hemolysis.  9. Diarrhea/abdominal discomfort: Not bloody, WBCs 11.5 w/o fever.  Hopefully not ischemic colitis now that cardiac output is restored. Symptomatic treatment for now.  Improving.   CRITICAL CARE Performed by: Loralie Champagne  Total critical care time: 40 minutes  Critical care time was exclusive of separately billable procedures and treating other patients.  Critical care was necessary to treat or prevent imminent or life-threatening deterioration.  Critical care was time spent personally by me on the following activities: development of treatment plan with patient and/or surrogate as well as nursing, discussions with consultants, evaluation of patient's response to treatment, examination of patient, obtaining history from patient or surrogate, ordering and performing treatments and interventions, ordering and review of laboratory studies, ordering and review of radiographic studies, pulse oximetry and re-evaluation of patient's condition.   Length of Stay: [place], MD  06/28/2019, 7:35 AM  Advanced Heart Failure Team Pager [phone] (M-F; 7a - 4p)  Please contact [facility] for night-coverage after hours (4p -7a ) and weekends  on [email or web address]

## 2019-06-28 NOTE — H&P (Signed)
History and Physical Interval Note:  06/28/2019 1:35 PM  [name]  has presented today for surgery, with the diagnosis of cardiogenic shock.  The various methods of treatment have been discussed with the patient and family. After consideration of risks, benefits and other options for treatment, the patient has consented to  PROCEDURE: Removal of right axillary artery Impella 5.5 LVAD as a surgical intervention.  The patient's history has been reviewed, patient examined, no change in status, stable for surgery.  I have reviewed the patient's chart and labs.  Questions were answered to the patient's satisfaction.     Wendy [name]

## 2019-06-28 NOTE — Anesthesia Procedure Notes (Signed)
Procedure Name: Intubation Date/Time: 06/28/2019 2:03 PM Performed by: Oletta Lamas, CRNA Pre-anesthesia Checklist: Patient identified, Emergency Drugs available, Suction available and Patient being monitored Patient Re-evaluated:Patient Re-evaluated prior to induction Oxygen Delivery Method: Circle System Utilized Preoxygenation: Pre-oxygenation with 100% oxygen Induction Type: IV induction Ventilation: Mask ventilation without difficulty Tube type: Oral Number of attempts: 1 Airway Equipment and Method: Stylet Placement Confirmation: ETT inserted through vocal cords under direct vision,  positive ETCO2 and breath sounds checked- equal and bilateral Secured at: 22 cm Tube secured with: Tape Dental Injury: Teeth and Oropharynx as per pre-operative assessment

## 2019-06-28 NOTE — Brief Op Note (Signed)
06/28/2019  2:48 PM  PATIENT:  Wendy [name]  69 y.o. female  PRE-OPERATIVE DIAGNOSIS:  therapy completed, history of recent non st elevation mycardial infarction   POST-OPERATIVE DIAGNOSIS:  therapy completed, history of recent non st elevation mycardial infarction   PROCEDURE:  Procedure(s): REMOVAL OF IMPELLA LEFT VENTRICULAR ASSIST DEVICE (Right)  SURGEON:  Surgeon(s) and Role:    * Wonda Olds, MD - Primary  PHYSICIAN ASSISTANT: n/a  ASSISTANTS: staff  ANESTHESIA:   general  EBL:  10 mL   BLOOD ADMINISTERED:none  DRAINS: none   LOCAL MEDICATIONS USED:  NONE  SPECIMEN:  No Specimen  DISPOSITION OF SPECIMEN:  N/A  COUNTS:  YES  TOURNIQUET:  * No tourniquets in log *  DICTATION: .Note written in EPIC  PLAN OF CARE: Admit to inpatient   PATIENT DISPOSITION:  ICU - extubated and stable.   Delay start of Pharmacological VTE agent (>24hrs) due to surgical blood loss or risk of bleeding: yes

## 2019-06-29 DIAGNOSIS — R570 Cardiogenic shock: Secondary | ICD-10-CM | POA: Diagnosis not present

## 2019-06-29 LAB — COMPREHENSIVE METABOLIC PANEL
ALT: 19 U/L (ref 0–44)
AST: 21 U/L (ref 15–41)
Albumin: 2 g/dL — ABNORMAL LOW (ref 3.5–5.0)
Alkaline Phosphatase: 67 U/L (ref 38–126)
Anion gap: 8 (ref 5–15)
BUN: 7 mg/dL — ABNORMAL LOW (ref 8–23)
CO2: 26 mmol/L (ref 22–32)
Calcium: 7.8 mg/dL — ABNORMAL LOW (ref 8.9–10.3)
Chloride: 105 mmol/L (ref 98–111)
Creatinine, Ser: 0.83 mg/dL (ref 0.44–1.00)
GFR calc Af Amer: >60 mL/min (ref >60)
GFR calc non Af Amer: >60 mL/min (ref >60)
Glucose, Bld: 109 mg/dL — ABNORMAL HIGH (ref 70–99)
Potassium: 4.1 mmol/L (ref 3.5–5.1)
Sodium: 139 mmol/L (ref 135–145)
Total Bilirubin: 0.8 mg/dL (ref 0.3–1.2)
Total Protein: 4.5 g/dL — ABNORMAL LOW (ref 6.5–8.1)

## 2019-06-29 LAB — CBC WITH DIFFERENTIAL/PLATELET
Abs Immature Granulocytes: 0.09 10*3/uL — ABNORMAL HIGH (ref 0.00–0.07)
Basophils Absolute: 0 10*3/uL (ref 0.0–0.1)
Basophils Relative: 0 %
Eosinophils Absolute: 0 10*3/uL (ref 0.0–0.5)
Eosinophils Relative: 0 %
HCT: 24.3 % — ABNORMAL LOW (ref 36.0–46.0)
Hemoglobin: 7.6 g/dL — ABNORMAL LOW (ref 12.0–15.0)
Immature Granulocytes: 1 %
Lymphocytes Relative: 6 %
Lymphs Abs: 0.5 10*3/uL — ABNORMAL LOW (ref 0.7–4.0)
MCH: 29.1 pg (ref 26.0–34.0)
MCHC: 31.3 g/dL (ref 30.0–36.0)
MCV: 93.1 fL (ref 80.0–100.0)
Monocytes Absolute: 0.2 10*3/uL (ref 0.1–1.0)
Monocytes Relative: 2 %
Neutro Abs: 8 10*3/uL — ABNORMAL HIGH (ref 1.7–7.7)
Neutrophils Relative %: 91 %
Platelets: 141 10*3/uL — ABNORMAL LOW (ref 150–400)
RBC: 2.61 MIL/uL — ABNORMAL LOW (ref 3.87–5.11)
RDW: 16.8 % — ABNORMAL HIGH (ref 11.5–15.5)
WBC: 8.8 10*3/uL (ref 4.0–10.5)
nRBC: 0 % (ref 0.0–0.2)

## 2019-06-29 LAB — COOXEMETRY PANEL
Carboxyhemoglobin: 2.3 % — ABNORMAL HIGH (ref 0.5–1.5)
Methemoglobin: 1.3 % (ref 0.0–1.5)
O2 Saturation: 59 %
Total hemoglobin: 7.8 g/dL — ABNORMAL LOW (ref 12.0–16.0)

## 2019-06-29 LAB — GLUCOSE, CAPILLARY
Glucose-Capillary: 112 mg/dL — ABNORMAL HIGH (ref 70–99)
Glucose-Capillary: 104 mg/dL — ABNORMAL HIGH (ref 70–99)
Glucose-Capillary: 94 mg/dL (ref 70–99)
Glucose-Capillary: 104 mg/dL — ABNORMAL HIGH (ref 70–99)
Glucose-Capillary: 90 mg/dL (ref 70–99)
Glucose-Capillary: 95 mg/dL (ref 70–99)

## 2019-06-29 LAB — HEPARIN LEVEL (UNFRACTIONATED)
Heparin Unfractionated: <0.1 IU/mL — ABNORMAL LOW (ref 0.30–0.70)
Heparin Unfractionated: <0.1 IU/mL — ABNORMAL LOW (ref 0.30–0.70)

## 2019-06-29 LAB — LACTATE DEHYDROGENASE: LDH: 259 U/L — ABNORMAL HIGH (ref 98–192)

## 2019-06-29 MED ORDER — FUROSEMIDE 10 MG/ML IJ SOLN
40.0000 mg | Freq: Once | INTRAMUSCULAR | Status: AC
  Administered 2019-06-29: 40 mg via INTRAVENOUS
  Filled 2019-06-29: qty 4

## 2019-06-29 MED ORDER — NITROGLYCERIN 0.4 MG SL SUBL
SUBLINGUAL_TABLET | SUBLINGUAL | Status: AC
  Administered 2019-06-29: 0.4 mg
  Filled 2019-06-29: qty 1

## 2019-06-29 MED ORDER — POTASSIUM CHLORIDE CRYS ER 20 MEQ PO TBCR
20.0000 meq | EXTENDED_RELEASE_TABLET | Freq: Once | ORAL | Status: AC
  Administered 2019-06-29: 20 meq via ORAL
  Filled 2019-06-29: qty 1

## 2019-06-29 MED ORDER — PRAMIPEXOLE DIHYDROCHLORIDE 0.25 MG PO TABS
0.2500 mg | ORAL_TABLET | Freq: Every day | ORAL | Status: DC
  Administered 2019-06-29 – 2019-07-11 (×13): 0.25 mg via ORAL
  Filled 2019-06-29 (×15): qty 1

## 2019-06-29 MED ORDER — AMIODARONE HCL 200 MG PO TABS
200.0000 mg | ORAL_TABLET | Freq: Two times a day (BID) | ORAL | Status: DC
  Administered 2019-06-29 – 2019-07-05 (×13): 200 mg via ORAL
  Filled 2019-06-29 (×13): qty 1

## 2019-06-29 MED ORDER — NITROGLYCERIN 0.4 MG SL SUBL
0.4000 mg | SUBLINGUAL_TABLET | SUBLINGUAL | Status: DC | PRN
  Administered 2019-06-29 (×2): 0.4 mg via SUBLINGUAL

## 2019-06-29 NOTE — Progress Notes (Signed)
Patient ID: Wendy [name], female   DOB: 01/15/1951, 69 y.o.   MRN: [id number]     Advanced Heart Failure Rounding Note  PCP-Cardiologist: [name], MD   Subjective:    - 4/13 aortobifemoral bypass - Impella 5.5 placement 4/19 for cardiogenic shock.  - Extubated 4/20 - LHC 4/22  (left brachial access): All grafts occluded (known), totally occluded RCA, OM1, OM3, 80% dLCx, 60% pLAD.  No change, no intervention.  - Impella 5.5 removed 4/23  Impella pulled yesterday. Says she feels better. Asking for pain meds and benzos to be given together.  Denies CP or SOB. Groin sites ok .  Off pressors/inotropes. Weight 160 (pre-op 158)  Swan numbers: CVP 10 PA 35/15 CI 5.4/3.1 Co-ox 59%  Objective:   Weight Range: 73 kg Body mass index is 30.41 kg/m.   Vital Signs:   Temp:  [97.2 F (36.2 C)-98.8 F (37.1 C)] 98.4 F (36.9 C) (04/24 1300) Pulse Rate:  [58-142] 59 (04/24 1300) Resp:  [11-24] 16 (04/24 1300) BP: (95-157)/(48-81) 105/48 (04/24 1300) SpO2:  [85 %-100 %] 86 % (04/24 1300) Last BM Date: 06/28/19  Weight change: Filed Weights   06/26/19 0615 06/27/19 0500 06/28/19 0527  Weight: 78.3 kg 75.5 kg 73 kg    Intake/Output:   Intake/Output Summary (Last 24 hours) at 06/29/2019 1326 Last data filed at 06/29/2019 1200 Gross per 24 hour  Intake 1806.54 ml  Output 965 ml  Net 841.54 ml      Physical Exam    General:  Sitting in chair No resp difficulty HEENT: normal Neck: supple. RIJ swan . Carotids 2+ bilat; no bruits. No lymphadenopathy or thryomegaly appreciated. Cor: Impella incision ok  PMI nondisplaced. Regular rate & rhythm. No rubs, gallops or murmurs. Lungs: clear. Decreased at bases Abdomen: soft, nontender, nondistended. No hepatosplenomegaly. No bruits or masses. Good bowel sounds. Extremities: no cyanosis, clubbing, rash, 1-2+ edema  Neuro: alert & orientedx3, cranial nerves grossly intact. moves all 4 extremities w/o difficulty. Affect  pleasant   Telemetry   NSR 60s (personally reviewed)  Labs    CBC Recent Labs    06/27/19 2232 06/29/19 0438  WBC 11.5* 8.8  NEUTROABS  --  8.0*  HGB 9.1* 7.6*  HCT 29.2* 24.3*  MCV 92.7 93.1  PLT 153 [id number]*   Basic Metabolic Panel Recent Labs    06/28/19 0229 06/29/19 0438  NA 139 139  K 3.6 4.1  CL 105 105  CO2 26 26  GLUCOSE 134* 109*  BUN 8 7*  CREATININE 1.00 0.83  CALCIUM 7.4* 7.8*   Liver Function Tests Recent Labs    06/28/19 0229 06/29/19 0438  AST 17 21  ALT 16 19  ALKPHOS 61 67  BILITOT 0.4 0.8  PROT 4.3* 4.5*  ALBUMIN 1.8* 2.0*   No results for input(s): LIPASE, AMYLASE in the last 72 hours. Cardiac Enzymes No results for input(s): CKTOTAL, CKMB, CKMBINDEX, TROPONINI in the last 72 hours.  BNP: BNP (last 3 results) Recent Labs    06/19/19 1239  BNP 366.7*    ProBNP (last 3 results) No results for input(s): PROBNP in the last 8760 hours.   D-Dimer No results for input(s): DDIMER in the last 72 hours. Hemoglobin A1C No results for input(s): HGBA1C in the last 72 hours. Fasting Lipid Panel No results for input(s): CHOL, HDL, LDLCALC, TRIG, CHOLHDL, LDLDIRECT in the last 72 hours. Thyroid Function Tests No results for input(s): TSH, T4TOTAL, T3FREE, THYROIDAB in the last 72 hours.  Invalid input(s): FREET3  Other results:   Imaging    No results found.   Medications:     Scheduled Medications: . sodium chloride   Intravenous Once  . aspirin  81 mg Oral Daily  . atorvastatin  80 mg Oral Daily  . buPROPion  150 mg Oral Daily  . Chlorhexidine Gluconate Cloth  6 each Topical Daily  . clopidogrel  75 mg Oral Daily  . digoxin  0.125 mg Oral Daily  . ezetimibe  10 mg Oral Daily  . insulin aspart  0-9 Units Subcutaneous Q4H  . LORazepam  0.5 mg Intravenous Once  . mupirocin ointment  1 application Nasal BID  . pantoprazole  40 mg Oral Daily  . pramipexole  0.25 mg Oral QHS  . sacubitril-valsartan  1 tablet Oral BID  .  sertraline  150 mg Oral QHS  . sodium chloride flush  10-40 mL Intracatheter Q12H  . sodium chloride flush  3 mL Intravenous Q12H  . spironolactone  25 mg Oral Daily    Infusions: . sodium chloride Stopped (06/28/19 1336)  . sodium chloride    . amiodarone 30 mg/hr (06/29/19 1200)  . epinephrine Stopped (06/24/19 1719)  . lactated ringers 20 mL/hr at 06/29/19 1200  . nitroGLYCERIN Stopped (06/25/19 1304)    PRN Medications: sodium chloride, sodium chloride, acetaminophen **OR** acetaminophen, ALPRAZolam, alum & mag hydroxide-simeth, diphenhydrAMINE **OR** diphenhydrAMINE, docusate sodium, fentaNYL (SUBLIMAZE) injection, gabapentin, guaiFENesin-dextromethorphan, metoprolol tartrate, ondansetron, oxyCODONE-acetaminophen, phenol, polyethylene glycol, sodium chloride flush, sodium chloride flush   Assessment/Plan   1. Cardiogenic shock: Progressive worsening 4/19 culminating in PEA arrest.  Lactate 8.1.  She had episodes of hypotension and atrial fibrillation/RVR earlier in her post-op course as well.  Symptoms were dyspnea and anxiety no chest pain.  No STEMI on ECG but concern for possible coronary event based on history.  Initial troponin 1802 => 2957.  Initially on NE, epi then Impella 5.5 placed on 4/19.  Now MAP is stable and off pressors.  Lactate has normalized.  - Impella 5.5 out 4/23. Swan numbers look good off of all pressors - Pull swan 2. Acute systolic CHF: Suspect ischemic cardiomyopathy.  Echo today with EF 15-20%, diffuse hypokinesis, preserved RV function.  Pre-op echo with EF 60%.  HS-TnI 1802 => 2957, elevated but not markedly high.  [place] 4/22 showed unchanged coronary anatomy, suspect patient got ischemic with hypotension. - Impella 5.5 out 4/23. Swan numbers look good off of all pressors  - Repeat co-ox now, does not match thermodilution CI.  - CVP 10. Mild LE edema. Give one dose lasix today - Continue Entresto 24/26 bid - Continue digoxin  - Continue spironolactone 25  mg daily.   - Place TED hose - No b-blocker yet 3. AKI: Creatinine as high as 2.86, started trending down and is 1.0 today.  Follow closely.  4. CAD: LHC in 3/21 showed occluded SVG-RCA and SVG-OM and atretic LIMA-LAD.  The LAD was patent with 60% mid vessel stenosis, the RCA was occluded with collaterals, and OM1 and OM2 were occluded.  No intervention.  Surprisingly, EF reported as normal pre-op.  No chest pain but had dyspnea and anxiety today.  ECG did not show STEMI, showed fairly stable lateral TWIs.  HS-TnI to 2957. [place] 4/22 showed unchanged coronary anatomy, suspect she was ischemic with hypotension.  No interventional target.  Left brachial site benign and radial pulse can be faintly dopplered.  - Continue statin, ASA, and Plavix for now. Will eventually stop ASA/Plavix  as she will need long-term oral anticoagulation (Eliquis) unless vascular wants an antiplatelet agent post-aortobifemoral bypass.  5. PAD: S/p aortobifemoral bypass on 4/13.  Surgical site is stable, she has had palpable PT pulses.  6. Acute hypoxemic respiratory failure: Intubated with PEA arrest. Resolved, extubated.  Chest still sore from CPR. Resoved 7. Atrial fibrillation: Paroxysmal.  She has has been in and out of atrial fibrillation post-op.  Has had episodes of junctional rhythm also.  NSR currently.   - Remains in NSR. On IV amio at 7.  - Switch to amio 200 bid now off pressors.  - Likely switch DAPT to Eliquis tomorrow. D/w TCTS and pharmD 8. Thrombocytopenia: Post-Impella placement. Plts now stable.  9. Diarrhea/abdominal discomfort: Not bloody, WBCs 11.5 w/o fever.  Hopefully not ischemic colitis now that cardiac output is restored. Symptomatic treatment for now. Resolved 10. Anemia, post-op - hgb 7.6. Transfuse < 7.5  Plan:  - Pull swan and Foley  - Lasix 40 IV x 1 - Place TED hose - Start Eliquis in am - Can likely to 4E  CRITICAL CARE Performed by: Glori Bickers  Total critical care time: 35  minutes  Critical care time was exclusive of separately billable procedures and treating other patients.  Critical care was necessary to treat or prevent imminent or life-threatening deterioration.  Critical care was time spent personally by me on the following activities: development of treatment plan with patient and/or surrogate as well as nursing, discussions with consultants, evaluation of patient's response to treatment, examination of patient, obtaining history from patient or surrogate, ordering and performing treatments and interventions, ordering and review of laboratory studies, ordering and review of radiographic studies, pulse oximetry and re-evaluation of patient's condition.   Length of Stay: [place], MD  06/29/2019, 1:26 PM  Advanced Heart Failure Team Pager [phone] (M-F; [place])  Please contact [facility] for night-coverage after hours (4p -7a ) and weekends on [email or web address]

## 2019-06-29 NOTE — Progress Notes (Signed)
    Subjective  -   No acute overnight evente Impella removed yesterday   Physical Exam:  No significant drainage form either groins abd soft Palpable PT pulses Bilateral       Assessment/Plan:    Cardiac:  Impella removed yesterday.  Remains on amiodarone IV.  In NSR.  Restart heparin today if OK with CT surgery GI:  Advanced to reg HH diet ID:  No active issues Prophylaxis:  protonix Pulm:  Wean O2   Wendy [name] 06/29/2019 7:46 AM --  Vitals:   06/29/19 0600 06/29/19 0700  BP: (!) 103/56 (!) 111/55  Pulse: 62 60  Resp: 11 13  Temp: (!) 97.2 F (36.2 C) (!) 97.2 F (36.2 C)  SpO2: 95% 96%    Intake/Output Summary (Last 24 hours) at 06/29/2019 0746 Last data filed at 06/29/2019 0700 Gross per 24 hour  Intake 1715.83 ml  Output 825 ml  Net 890.83 ml     Laboratory CBC    Component Value Date/Time   WBC 8.8 06/29/2019 0438   HGB 7.6 (L) 06/29/2019 0438   HGB 13.4 06/04/2019 1036   HCT 24.3 (L) 06/29/2019 0438   HCT 41.7 06/04/2019 1036   PLT 141 (L) 06/29/2019 0438   PLT 205 06/04/2019 1036    BMET    Component Value Date/Time   NA 139 06/29/2019 0438   NA 141 06/04/2019 1036   K 4.1 06/29/2019 0438   CL 105 06/29/2019 0438   CO2 26 06/29/2019 0438   GLUCOSE 109 (H) 06/29/2019 0438   BUN 7 (L) 06/29/2019 0438   BUN 14 06/04/2019 1036   CREATININE 0.83 06/29/2019 0438   CREATININE 0.96 08/13/2015 0939   CALCIUM 7.8 (L) 06/29/2019 0438   GFRNONAA >60 06/29/2019 0438   GFRAA >60 06/29/2019 0438    COAG Lab Results  Component Value Date   INR 1.8 (H) 06/24/2019   INR 1.4 (H) 06/18/2019   INR 1.6 (H) 06/14/2019   No results found for: PTT  Antibiotics Anti-infectives (From admission, onward)   Start     Dose/Rate Route Frequency Ordered Stop   06/18/19 1730  clindamycin (CLEOCIN) IVPB 300 mg     300 mg 100 mL/hr over 30 Minutes Intravenous Every 8 hours 06/18/19 1305 06/19/19 0131   06/18/19 0606  vancomycin (VANCOCIN) IVPB  1000 mg/200 mL premix     1,000 mg 200 mL/hr over 60 Minutes Intravenous 60 min pre-op 06/18/19 0606 06/18/19 0741       Wendy [name], M.D., [facility] [place] Office: [phone] Pager:  [phone]

## 2019-06-29 NOTE — Progress Notes (Signed)
0800 Unable to cover AM blood sugar r/t pt eating breakfast at shift change, no preprandial CBG taken Good appetite this morning, but poor po intake 2/2 food being cold BPA for packing placed in OR d/w Dr. Cyndia Bent during rounds: ok to remove packing and place dressing today  0900 Packing removed from R axillary incision w minimal bleeding. Dry gauze dressing secured with tape.  1000 Pt's sister called; updated. All questions answered BM Up to chair for bath  1100 Poor pain control (post ambulation)  1200 Pt having difficulty eating meal trays r/t not having her lower dentures  1300 Pt's sister @ bedside  1500 Ted hose applied BL; education provided and questions answered. Foley d/c held for lasix administration Keep swan per Bensimhon, MD.  1600 Pt has received six 1u doses of insulin for the last 27 checks. Please consider d/c'ing glycemic control for pt comfort unless indicated by daily lab values.  1800 Poor pain control continues

## 2019-06-29 NOTE — Op Note (Signed)
Procedure(s): REMOVAL OF IMPELLA LEFT VENTRICULAR ASSIST DEVICE Procedure Note  Wendy [name] female 69 y.o. 06/29/2019  Procedure(s) and Anesthesia Type:    * REMOVAL OF IMPELLA LEFT VENTRICULAR ASSIST DEVICE - General  Surgeon(s) and Role:    * Wonda Olds, MD - Primary   Indications: The patient has been supported with Impella 5.5 LVAD after suffering post operative MI and cardiac arrest.  She has recovered to the point of not requiring LVAD support hemodynamically.  Therefore she is taken to the operating room for LVAD removal.     Surgeon: Wonda Olds   Assistants: Staff  Anesthesia: General endotracheal anesthesia  ASA Class: 4    Procedure Detail  REMOVAL OF IMPELLA LEFT VENTRICULAR ASSIST DEVICE  Informed consent the patient is taken to the operating room at [facility] on 06/28/2019 placed in the supine position on the operating table.  Anesthesia has been in the smooth fashion by the general endotracheal technique.  After confirming adequate anesthesia, the anterior chest and abdomen was prepped and draped as a sterile field using Betadine solution.  A preop surgical pause is performed.  The existing right axillary artery incision is opened sharply.  The graft through which the Impella is placed is encircled.  The Impella is freed at the level of the counterincision.  The Impella is then withdrawn across the aortic valve and turned off.  The pump is then removed the side graft which is clamped approximately 1 cm above the level of the anastomosis.  It is then backbled to ensure that no thrombus is retained.  The stump of the graft is not oversewn with Prolene.  The wound is copiously irrigated and repaired in layers.  The counterincision inferior to the axillary incision is packed with Nu Gauze.  Sterile dressings are applied all sponge instrument and needle counts were correct and I was present and participated in all aspects.  Estimated Blood Loss:   less than 50 mL         Drains: None           Blood Given: none          Specimens: None         Implants: none        Complications:  * No complications entered in OR log *         Disposition: ICU - extubated and stable.         Condition: stable

## 2019-06-30 ENCOUNTER — Inpatient Hospital Stay (HOSPITAL_COMMUNITY): Payer: Medicare HMO | Admitting: Anesthesiology

## 2019-06-30 ENCOUNTER — Inpatient Hospital Stay (HOSPITAL_COMMUNITY): Payer: Medicare HMO

## 2019-06-30 ENCOUNTER — Encounter (HOSPITAL_COMMUNITY)
Admission: RE | Disposition: A | Discharge status: Skilled Nursing Facility | Payer: Self-pay | Source: Home / Self Care | Attending: Vascular Surgery

## 2019-06-30 DIAGNOSIS — I25110 Atherosclerotic heart disease of native coronary artery with unstable angina pectoris: Secondary | ICD-10-CM | POA: Diagnosis not present

## 2019-06-30 DIAGNOSIS — M79609 Pain in unspecified limb: Secondary | ICD-10-CM | POA: Diagnosis not present

## 2019-06-30 DIAGNOSIS — I509 Heart failure, unspecified: Secondary | ICD-10-CM | POA: Diagnosis not present

## 2019-06-30 DIAGNOSIS — R570 Cardiogenic shock: Secondary | ICD-10-CM | POA: Diagnosis not present

## 2019-06-30 DIAGNOSIS — I721 Aneurysm of artery of upper extremity: Secondary | ICD-10-CM

## 2019-06-30 DIAGNOSIS — I110 Hypertensive heart disease with heart failure: Secondary | ICD-10-CM | POA: Diagnosis not present

## 2019-06-30 HISTORY — PX: ARTERY REPAIR: SHX559

## 2019-06-30 LAB — CBC WITH DIFFERENTIAL/PLATELET
Abs Immature Granulocytes: 0.12 10*3/uL — ABNORMAL HIGH (ref 0.00–0.07)
Basophils Absolute: 0 10*3/uL (ref 0.0–0.1)
Basophils Relative: 0 %
Eosinophils Absolute: 0.2 10*3/uL (ref 0.0–0.5)
Eosinophils Relative: 3 %
HCT: 25.8 % — ABNORMAL LOW (ref 36.0–46.0)
Hemoglobin: 7.8 g/dL — ABNORMAL LOW (ref 12.0–15.0)
Immature Granulocytes: 2 %
Lymphocytes Relative: 16 %
Lymphs Abs: 1.2 10*3/uL (ref 0.7–4.0)
MCH: 28.7 pg (ref 26.0–34.0)
MCHC: 30.2 g/dL (ref 30.0–36.0)
MCV: 94.9 fL (ref 80.0–100.0)
Monocytes Absolute: 0.3 10*3/uL (ref 0.1–1.0)
Monocytes Relative: 4 %
Neutro Abs: 5.7 10*3/uL (ref 1.7–7.7)
Neutrophils Relative %: 75 %
Platelets: 154 10*3/uL (ref 150–400)
RBC: 2.72 MIL/uL — ABNORMAL LOW (ref 3.87–5.11)
RDW: 17.3 % — ABNORMAL HIGH (ref 11.5–15.5)
WBC: 7.5 10*3/uL (ref 4.0–10.5)
nRBC: 0.4 % — ABNORMAL HIGH (ref 0.0–0.2)

## 2019-06-30 LAB — GLUCOSE, CAPILLARY
Glucose-Capillary: 119 mg/dL — ABNORMAL HIGH (ref 70–99)
Glucose-Capillary: 85 mg/dL (ref 70–99)
Glucose-Capillary: 88 mg/dL (ref 70–99)
Glucose-Capillary: 91 mg/dL (ref 70–99)
Glucose-Capillary: 91 mg/dL (ref 70–99)
Glucose-Capillary: 98 mg/dL (ref 70–99)

## 2019-06-30 LAB — LACTATE DEHYDROGENASE: LDH: 243 U/L — ABNORMAL HIGH (ref 98–192)

## 2019-06-30 LAB — COOXEMETRY PANEL
Carboxyhemoglobin: 1.4 % (ref 0.5–1.5)
Methemoglobin: 0.7 % (ref 0.0–1.5)
O2 Saturation: 56.7 %
Total hemoglobin: 7.8 g/dL — ABNORMAL LOW (ref 12.0–16.0)

## 2019-06-30 SURGERY — REPAIR, ARTERY, BRACHIAL
Anesthesia: Regional | Laterality: Left

## 2019-06-30 MED ORDER — MIDAZOLAM HCL 2 MG/2ML IJ SOLN
INTRAMUSCULAR | Status: AC
  Filled 2019-06-30: qty 2

## 2019-06-30 MED ORDER — LACTATED RINGERS IV SOLN
INTRAVENOUS | Status: DC | PRN
Start: 2019-06-30 — End: 2019-06-30

## 2019-06-30 MED ORDER — SODIUM CHLORIDE 0.9 % IV SOLN
INTRAVENOUS | Status: AC
  Filled 2019-06-30: qty 1.2

## 2019-06-30 MED ORDER — LIDOCAINE HCL (PF) 1 % IJ SOLN
INTRAMUSCULAR | Status: AC
  Filled 2019-06-30: qty 30

## 2019-06-30 MED ORDER — MIDAZOLAM HCL 2 MG/2ML IJ SOLN
INTRAMUSCULAR | Status: DC | PRN
  Administered 2019-06-30 (×2): 1 mg via INTRAVENOUS

## 2019-06-30 MED ORDER — PROPOFOL 10 MG/ML IV BOLUS
INTRAVENOUS | Status: AC
  Filled 2019-06-30: qty 20

## 2019-06-30 MED ORDER — SUFENTANIL CITRATE 50 MCG/ML IV SOLN
INTRAVENOUS | Status: DC | PRN
  Administered 2019-06-30: 5 ug via INTRAVENOUS

## 2019-06-30 MED ORDER — SODIUM CHLORIDE 0.9 % IR SOLN
Status: DC | PRN
  Administered 2019-06-30: 1000 mL

## 2019-06-30 MED ORDER — VANCOMYCIN HCL 1000 MG IV SOLR
INTRAVENOUS | Status: DC | PRN
  Administered 2019-06-30: 1000 mg via INTRAVENOUS

## 2019-06-30 MED ORDER — VANCOMYCIN HCL IN DEXTROSE 1-5 GM/200ML-% IV SOLN
INTRAVENOUS | Status: AC
  Filled 2019-06-30: qty 200

## 2019-06-30 MED ORDER — LIDOCAINE HCL 1 % IJ SOLN
INTRAMUSCULAR | Status: DC | PRN
  Administered 2019-06-30: 30 mL

## 2019-06-30 MED ORDER — EPINEPHRINE PF 1 MG/ML IJ SOLN
INTRAMUSCULAR | Status: DC | PRN
  Administered 2019-06-30: .1 mg via SUBCUTANEOUS

## 2019-06-30 MED ORDER — ONDANSETRON HCL 4 MG/2ML IJ SOLN
INTRAMUSCULAR | Status: DC | PRN
  Administered 2019-06-30: 4 mg via INTRAVENOUS

## 2019-06-30 MED ORDER — HEMOSTATIC AGENTS (NO CHARGE) OPTIME
TOPICAL | Status: DC | PRN
  Administered 2019-06-30: 1 via TOPICAL

## 2019-06-30 MED ORDER — ROPIVACAINE HCL 7.5 MG/ML IJ SOLN
INTRAMUSCULAR | Status: DC | PRN
Start: 2019-06-30 — End: 2019-06-30
  Administered 2019-06-30: 25 mL via PERINEURAL

## 2019-06-30 MED ORDER — SODIUM CHLORIDE 0.9 % IV BOLUS
250.0000 mL | Freq: Once | INTRAVENOUS | Status: AC
  Administered 2019-06-30: 250 mL via INTRAVENOUS

## 2019-06-30 MED ORDER — APIXABAN 5 MG PO TABS: 5.0000 mg | ORAL_TABLET | Freq: Two times a day (BID) | ORAL | Status: DC

## 2019-06-30 MED ORDER — SUFENTANIL CITRATE 50 MCG/ML IV SOLN
INTRAVENOUS | Status: AC
  Filled 2019-06-30: qty 1

## 2019-06-30 MED ORDER — DEXAMETHASONE SODIUM PHOSPHATE 10 MG/ML IJ SOLN
INTRAMUSCULAR | Status: DC | PRN
  Administered 2019-06-30: 10 mg via INTRAVENOUS

## 2019-06-30 MED ORDER — SODIUM CHLORIDE 0.9 % IV SOLN
INTRAVENOUS | Status: DC | PRN
  Administered 2019-06-30: 500 mL

## 2019-06-30 SURGICAL SUPPLY — 50 items
ADH SKN CLS APL DERMABOND .7 (GAUZE/BANDAGES/DRESSINGS) ×1
BNDG CMPR 9X4 STRL LF SNTH (GAUZE/BANDAGES/DRESSINGS)
BNDG ELASTIC 4X5.8 VLCR STR LF (GAUZE/BANDAGES/DRESSINGS) ×2 IMPLANT
BNDG ESMARK 4X9 LF (GAUZE/BANDAGES/DRESSINGS) IMPLANT
BNDG GAUZE ELAST 4 BULKY (GAUZE/BANDAGES/DRESSINGS) ×2 IMPLANT
CANISTER SUCT 3000ML PPV (MISCELLANEOUS) ×3 IMPLANT
CATH EMB 3FR 40CM (CATHETERS) ×2 IMPLANT
CLIP VESOCCLUDE MED 6/CT (CLIP) ×3 IMPLANT
CLIP VESOCCLUDE SM WIDE 6/CT (CLIP) ×3 IMPLANT
COVER PROBE W GEL 5X96 (DRAPES) ×2 IMPLANT
COVER WAND RF STERILE (DRAPES) ×3 IMPLANT
CUFF TOURN SGL QUICK 18X4 (TOURNIQUET CUFF) ×2 IMPLANT
CUFF TOURN SGL QUICK 24 (TOURNIQUET CUFF)
CUFF TRNQT CYL 24X4X16.5-23 (TOURNIQUET CUFF) IMPLANT
DECANTER SPIKE VIAL GLASS SM (MISCELLANEOUS) IMPLANT
DERMABOND ADVANCED (GAUZE/BANDAGES/DRESSINGS) ×2
DERMABOND ADVANCED .7 DNX12 (GAUZE/BANDAGES/DRESSINGS) ×1 IMPLANT
ELECT REM PT RETURN 9FT ADLT (ELECTROSURGICAL) ×3
ELECTRODE REM PT RTRN 9FT ADLT (ELECTROSURGICAL) ×1 IMPLANT
GAUZE SPONGE 4X4 12PLY STRL LF (GAUZE/BANDAGES/DRESSINGS) ×2 IMPLANT
GLOVE BIO SURGEON STRL SZ 6.5 (GLOVE) ×1 IMPLANT
GLOVE BIO SURGEON STRL SZ7 (GLOVE) ×3 IMPLANT
GLOVE BIO SURGEON STRL SZ7.5 (GLOVE) ×2 IMPLANT
GLOVE BIO SURGEONS STRL SZ 6.5 (GLOVE) ×1
GLOVE BIOGEL PI IND STRL 7.0 (GLOVE) IMPLANT
GLOVE BIOGEL PI IND STRL 7.5 (GLOVE) ×1 IMPLANT
GLOVE BIOGEL PI INDICATOR 7.0 (GLOVE) ×6
GLOVE BIOGEL PI INDICATOR 7.5 (GLOVE) ×4
GOWN STRL REUS W/ TWL LRG LVL3 (GOWN DISPOSABLE) ×3 IMPLANT
GOWN STRL REUS W/TWL LRG LVL3 (GOWN DISPOSABLE) ×9
HEMOSTAT SNOW SURGICEL 2X4 (HEMOSTASIS) ×2 IMPLANT
KIT BASIN OR (CUSTOM PROCEDURE TRAY) ×3 IMPLANT
KIT TURNOVER KIT B (KITS) ×3 IMPLANT
NS IRRIG 1000ML POUR BTL (IV SOLUTION) ×3 IMPLANT
PACK CV ACCESS (CUSTOM PROCEDURE TRAY) ×3 IMPLANT
PAD ARMBOARD 7.5X6 YLW CONV (MISCELLANEOUS) ×6 IMPLANT
PAD CAST 4YDX4 CTTN HI CHSV (CAST SUPPLIES) IMPLANT
PADDING CAST COTTON 4X4 STRL (CAST SUPPLIES)
SPONGE LAP 18X18 RF (DISPOSABLE) ×2 IMPLANT
SUT MNCRL AB 4-0 PS2 18 (SUTURE) ×3 IMPLANT
SUT PROLENE 5 0 C 1 24 (SUTURE) ×2 IMPLANT
SUT PROLENE 6 0 BV (SUTURE) ×1 IMPLANT
SUT PROLENE 6 0 CC (SUTURE) ×4 IMPLANT
SUT VIC AB 3-0 SH 27 (SUTURE) ×3
SUT VIC AB 3-0 SH 27X BRD (SUTURE) ×1 IMPLANT
SUT VIC AB 4-0 PS2 18 (SUTURE) ×2 IMPLANT
SYR 3ML LL SCALE MARK (SYRINGE) ×2 IMPLANT
TOWEL GREEN STERILE (TOWEL DISPOSABLE) ×3 IMPLANT
UNDERPAD 30X30 (UNDERPADS AND DIAPERS) ×3 IMPLANT
WATER STERILE IRR 1000ML POUR (IV SOLUTION) ×3 IMPLANT

## 2019-06-30 NOTE — Progress Notes (Signed)
Left bicep ecchymotic with new edema, skin taut and extremely tender to palpation. Radial & brachial pulses dopplerable, cap refill <3 seconds, skin warm/dry on left arm and hand, VSS. Dr. Haroldine Laws notified, pressure held to site for 15 minutes with slight resolution of edema. Stat doppler ordered. Diameter of bicep measured at 15". [name], [name]

## 2019-06-30 NOTE — Transfer of Care (Signed)
Immediate Anesthesia Transfer of Care Note  Patient: Wendy [name]  Procedure(s) Performed: LEFT BRACHIAL ARTERY REPAIR (Left )  Patient Location: PACU  Anesthesia Type:MAC combined with regional for post-op pain  Level of Consciousness: awake, alert , oriented and patient cooperative  Airway & Oxygen Therapy: Patient Spontanous Breathing  Post-op Assessment: Report given to RN, Post -op Vital signs reviewed and stable and Patient moving all extremities X 4  Post vital signs: Reviewed and stable  Last Vitals:  Vitals Value Taken Time  BP    Temp    Pulse    Resp    SpO2      Last Pain:  Vitals:   06/30/19 1653  TempSrc:   PainSc: 2       Patients Stated Pain Goal: 2 (47/84/12 8208)  Complications: No apparent anesthesia complications

## 2019-06-30 NOTE — Progress Notes (Signed)
  Right brachial site with 3.2 x 4.8 cm partially thrombosed PSA.   D/w Dr. Trula Slade.  Continue manual pressure.   Hold Eliquis.   Glori Bickers, MD  6:00 PM

## 2019-06-30 NOTE — Progress Notes (Signed)
   I evaluated patient at bedside.  Large palpable mass at left brachial AC site. Mild tingling in hand but sensation and perfusion intact. No evidence of critical limb ischemia.   I held manual pressure for > 30 mins. Patient with severe pain and given multiple doses of IV fentanyl.   Dr. Trula Slade arrived at bedside.   Decision made to take patient to OR tonight for ligation and washout.   CCT 40 mins.   Glori Bickers, MD  6:35 PM

## 2019-06-30 NOTE — Progress Notes (Signed)
Patient ID: Wendy [name], female   DOB: 1950/07/21, 69 y.o.   MRN: [id number]     Advanced Heart Failure Rounding Note  PCP-Cardiologist: [name], MD   Subjective:    - 4/13 aortobifemoral bypass - Impella 5.5 placement 4/19 for cardiogenic shock.  - Extubated 4/20 - LHC 4/22  (left brachial access): All grafts occluded (known), totally occluded RCA, OM1, OM3, 80% dLCx, 60% pLAD.  No change, no intervention.  - Impella 5.5 removed 4/23  Lying in chair. Feels weak. Asking for more xanax. Had some chest pain yesterday and was asking for Fentanyl. ECG improved from previous.   Off pressors.   Got 1 dose IV lasix yesterday. I/O -1.3L No orthopnea or PND. SBP 80s this afternoon  Objective:   Weight Range: 73 kg Body mass index is 30.41 kg/m.   Vital Signs:   Temp:  [97.6 F (36.4 C)-98.4 F (36.9 C)] 97.7 F (36.5 C) (04/25 1138) Pulse Rate:  [54-118] 64 (04/25 1200) Resp:  [0-26] 0 (04/25 1200) BP: (95-135)/(29-62) 109/54 (04/25 1200) SpO2:  [82 %-100 %] 100 % (04/25 1200) Last BM Date: 06/30/19  Weight change: Filed Weights   06/26/19 0615 06/27/19 0500 06/28/19 0527  Weight: 78.3 kg 75.5 kg 73 kg    Intake/Output:   Intake/Output Summary (Last 24 hours) at 06/30/2019 1319 Last data filed at 06/30/2019 0530 Gross per 24 hour  Intake 204.6 ml  Output 1665 ml  Net -1460.4 ml      Physical Exam    General:  Lying in chair. No resp difficulty HEENT: normal Neck: supple. no JVD. Carotids 2+ bilat; no bruits. No lymphadenopathy or thryomegaly appreciated. Cor: PMI nondisplaced. Regular rate & rhythm. No rubs, gallops or murmurs. Lungs: clear Abdomen: soft, nontender, nondistended. No hepatosplenomegaly. No bruits or masses. Good bowel sounds. Extremities: no cyanosis, clubbing, rash, edema Neuro: alert & orientedx3, cranial nerves grossly intact. moves all 4 extremities w/o difficulty. Affect pleasant   Telemetry   NSR 60s (personally  reviewed)  Labs    CBC Recent Labs    06/29/19 0438 06/30/19 0634  WBC 8.8 7.5  NEUTROABS 8.0* 5.7  HGB 7.6* 7.8*  HCT 24.3* 25.8*  MCV 93.1 94.9  PLT 141* 123456   Basic Metabolic Panel Recent Labs    06/28/19 0229 06/29/19 0438  NA 139 139  K 3.6 4.1  CL 105 105  CO2 26 26  GLUCOSE 134* 109*  BUN 8 7*  CREATININE 1.00 0.83  CALCIUM 7.4* 7.8*   Liver Function Tests Recent Labs    06/28/19 0229 06/29/19 0438  AST 17 21  ALT 16 19  ALKPHOS 61 67  BILITOT 0.4 0.8  PROT 4.3* 4.5*  ALBUMIN 1.8* 2.0*   No results for input(s): LIPASE, AMYLASE in the last 72 hours. Cardiac Enzymes No results for input(s): CKTOTAL, CKMB, CKMBINDEX, TROPONINI in the last 72 hours.  BNP: BNP (last 3 results) Recent Labs    06/19/19 1239  BNP 366.7*    ProBNP (last 3 results) No results for input(s): PROBNP in the last 8760 hours.   D-Dimer No results for input(s): DDIMER in the last 72 hours. Hemoglobin A1C No results for input(s): HGBA1C in the last 72 hours. Fasting Lipid Panel No results for input(s): CHOL, HDL, LDLCALC, TRIG, CHOLHDL, LDLDIRECT in the last 72 hours. Thyroid Function Tests No results for input(s): TSH, T4TOTAL, T3FREE, THYROIDAB in the last 72 hours.  Invalid input(s): FREET3  Other results:   Imaging  No results found.   Medications:     Scheduled Medications: . sodium chloride   Intravenous Once  . amiodarone  200 mg Oral BID  . aspirin  81 mg Oral Daily  . atorvastatin  80 mg Oral Daily  . buPROPion  150 mg Oral Daily  . Chlorhexidine Gluconate Cloth  6 each Topical Daily  . clopidogrel  75 mg Oral Daily  . digoxin  0.125 mg Oral Daily  . ezetimibe  10 mg Oral Daily  . insulin aspart  0-9 Units Subcutaneous Q4H  . LORazepam  0.5 mg Intravenous Once  . mupirocin ointment  1 application Nasal BID  . pantoprazole  40 mg Oral Daily  . pramipexole  0.25 mg Oral QHS  . sacubitril-valsartan  1 tablet Oral BID  . sertraline  150 mg  Oral QHS  . sodium chloride flush  10-40 mL Intracatheter Q12H  . sodium chloride flush  3 mL Intravenous Q12H  . spironolactone  25 mg Oral Daily    Infusions: . sodium chloride Stopped (06/28/19 1336)  . sodium chloride    . lactated ringers Stopped (06/29/19 1736)    PRN Medications: sodium chloride, sodium chloride, acetaminophen **OR** acetaminophen, ALPRAZolam, alum & mag hydroxide-simeth, diphenhydrAMINE **OR** diphenhydrAMINE, docusate sodium, fentaNYL (SUBLIMAZE) injection, gabapentin, guaiFENesin-dextromethorphan, metoprolol tartrate, nitroGLYCERIN, ondansetron, oxyCODONE-acetaminophen, phenol, polyethylene glycol, sodium chloride flush, sodium chloride flush   Assessment/Plan   1. Cardiogenic shock: Progressive worsening 4/19 culminating in PEA arrest.  Lactate 8.1.  She had episodes of hypotension and atrial fibrillation/RVR earlier in her post-op course as well.  Symptoms were dyspnea and anxiety no chest pain.  No STEMI on ECG but concern for possible coronary event based on history.  Initial troponin 1802 => 2957.  Initially on NE, epi then Impella 5.5 placed on 4/19.  Now MAP is stable and off pressors.  Lactate has normalized. Impella 5.5 out 4/23.  - Co-ox 57%. BP soft after IV diuresis yesterday 2. Acute systolic CHF: Suspect ischemic cardiomyopathy.  Echo 06/24/19 EF 20-25%, diffuse hypokinesis, preserved RV function.  Pre-op echo 06/17/19 EF 60%.  HS-TnI 1802 => 2957, elevated but not markedly high.  [place] 4/22 showed unchanged coronary anatomy, suspect patient got ischemic with hypotension. - Impella 5.5 out 4/23. Swan numbers look good off of all pressors. Co-ox ok at 57%. Good diuresis yesterday with IV lasix. SBP in 80s today  - Looks dry. Stop Entresto 24/26 bid - Give NS 250 x 1 - Continue digoxin  - Continue spironolactone 25 mg daily.   - Can add oral lasix as needed - No b-blocker yet with marginal co-ox and low BP  3. AKI: Creatinine as high as 2.86, started  trending down and is 0.8 today.   4. CAD: LHC in 3/21 showed occluded SVG-RCA and SVG-OM and atretic LIMA-LAD.  The LAD was patent with 60% mid vessel stenosis, the RCA was occluded with collaterals, and OM1 and OM2 were occluded.  No intervention.  Surprisingly, EF reported as normal pre-op.  No chest pain but had dyspnea and anxiety today.  ECG did not show STEMI, showed fairly stable lateral TWIs.  HS-TnI to 2957. [place] 4/22 showed unchanged coronary anatomy, suspect she was ischemic with hypotension.  No interventional target.  Left brachial site benign and radial pulse can be faintly dopplered.  - Continue statin, ASA, and Plavix for now. Will eventually stop ASA/Plavix as she will need long-term oral anticoagulation (Eliquis) unless vascular wants an antiplatelet agent post-aortobifemoral bypass.  5. PAD:  S/p aortobifemoral bypass on 4/13.  Surgical site is stable, she has had palpable PT pulses.  6. Acute hypoxemic respiratory failure: Intubated with PEA arrest. Resolved, extubated.  Chest still sore from CPR. Resoved 7. Atrial fibrillation: Paroxysmal.  She has has been in and out of atrial fibrillation post-op.  Has had episodes of junctional rhythm also.  NSR currently.   - Remains in NSR. Now on po amio 200 bid   - Likely switch DAPT to Eliquis later today 8. Thrombocytopenia: Post-Impella placement. Plts now stable.  9. Diarrhea/abdominal discomfort:  Resolved 10. Anemia, post-op - hgb 7.6 -> 7.8. Transfuse < 7.5 11. CP - ECG improved. Doubt ischemic - ? Related to CPR 12. Deconditioning - Continue PT/OT  Ok to go to 4E from our standpoint once BP stabilized.  Length of Stay: [place], MD  06/30/2019, 1:19 PM  Advanced Heart Failure Team Pager [phone] (M-F; 7a - 4p)  Please contact [facility] for night-coverage after hours (4p -7a ) and weekends on [email or web address]

## 2019-06-30 NOTE — Progress Notes (Signed)
VASCULAR LAB PRELIMINARY  PRELIMINARY  PRELIMINARY  PRELIMINARY  Left upper extremity duplex to evaluate for pseudo completed.    Preliminary report:  See CV proc for preliminary results.  Djuana Littleton, RVT 06/30/2019, 5:42 PM

## 2019-06-30 NOTE — Progress Notes (Signed)
Wendy [name] has had received pains meds nearly q2-3hrs this shift. She complained of chest pain at a time and stated that "if I was at home then she would take a nitro tablet." The RN had previously given a PRN med for generalized pain and afterward asked her if she felt incisional pain, in which she stated she believed so. After the last statement, the RN completed an EKG and contacted Cardiology. Wendy [name] was given 3 nitroglycerin tabs with approval of the Cardiology MD that reviewed her EKG. Later on after receiving some one other PRN med, she was able to go to sleep. After turning in bed and getting on the bed pain, she stated that the pain increased, seeming more muscular. She was also able to get more rest throughout the rest of the shift.

## 2019-06-30 NOTE — Op Note (Signed)
    Patient name: KROSBY BRING MRN: [id number] DOB: 1950/07/01 Sex: female  06/30/2019 Pre-operative Diagnosis: Left brachial artery pseudoaneurysm Post-operative diagnosis:  Same Surgeon:  Annamarie Major Assistants: Luanne Bras Procedure:   Repair of left brachial artery pseudoaneurysm Anesthesia: Axillary block and local Blood Loss: 100 cc Specimens: None  Findings: Primary repair of brachial artery arteriotomy.  Hematoma tracked up under the fascia which was all evacuated  Indications: This is a 68 year old female who has undergone aortobifemoral bypass graft complicated by cardiac issues requiring cardiac catheterization in the postoperative period.  Earlier this afternoon she had acute onset of pain in the left antecubital area around her prior brachial artery catheterization site.  Ultrasound confirmed a 5 cm pseudoaneurysm.  Manual compression was performed.  Patient had persistent pain as well as numbness in her fingers and so I felt exploration in the operating room was necessary.  Consent was obtained from the patient with her sister at bedside.  Procedure:  The patient was identified in the holding area and taken to [facility]  The patient was then placed supine on the table.  Axillary block and local anesthesia were administered.  The patient was prepped and draped in the usual sterile fashion.  A time out was called and antibiotics were administered.  A longitudinal incision was made beginning at the antecubital crease extending proximally.  Cautery was used about subcutaneous tissue.  Once I got through the subcutaneous tissue, the patient had a significant hematoma under the biceps fascia which was very tight.  I opened this with Metzenbaum scissors and evacuated a large amount of clot which tracked up the arm.  Once the clot was evacuated, pulsatile flow was coming out of the brachial artery.  I controlled this with digital compression and then dissected out the brachial artery  above and below that site.  I then occluded the brachial artery with vascular clamps.  I opened the arteriotomy slightly with Potts scissors so that I could visualize the intima.  I then passed a #3 Fogarty catheter proximally and distally without any thrombus evacuation.  There was excellent inflow and excellent backbleeding.  I then repaired the arteriotomy site with two 6-0 Prolene interrupted sutures.  Clamps were then released and blood flow was reestablished to the hand.  She had triphasic radial and ulnar Doppler signals.  The wound was irrigated and hemostasis was achieved.  I reapproximated the subcutaneous tissue with 3-0 Vicryl and the skin was closed with a 4-0 subcuticular stitch.  Dermabond was placed followed by an Ace wrap.  There were no immediate complications.   Disposition: To PACU stable.   Theotis Burrow, M.D., [facility] [place] Office: [phone] Pager:  [phone]

## 2019-06-30 NOTE — Progress Notes (Signed)
    Subjective  - POD #12  Sitting up in bed eating breakfast She says her legs feel very weak. Complaining of lower substernal chest pain   Physical Exam:  Abdomen is soft with intact incision without hernia Palpable pedal pulses. Groins are soft without drainage       Assessment/Plan:  POD #12  CV: Off all pressors.  Impella has been removed.  Swan removed yesterday.  Okay to start anticoagulation when cleared by cardiac surgery Pulmonary: Wean nasal cannula oxygen GI: Tolerating regular diet Renal: Normal creatinine.  She had good diuresis yesterday with almost 2 L after getting Lasix Prophylaxis: Protonix.  Anticoagulation likely starting today. Dispo: She will need PT OT and likely CIR consult.  Likely transfer to the floor today  Wells Roxi Hlavaty 06/30/2019 10:21 AM --  Vitals:   06/30/19 0754 06/30/19 0930  BP:  (!) 103/50  Pulse:  63  Resp:  17  Temp: 97.7 F (36.5 C)   SpO2:  100%    Intake/Output Summary (Last 24 hours) at 06/30/2019 1021 Last data filed at 06/30/2019 0530 Gross per 24 hour  Intake 274.02 ml  Output 1965 ml  Net -1690.98 ml     Laboratory CBC    Component Value Date/Time   WBC 7.5 06/30/2019 0634   HGB 7.8 (L) 06/30/2019 0634   HGB 13.4 06/04/2019 1036   HCT 25.8 (L) 06/30/2019 0634   HCT 41.7 06/04/2019 1036   PLT 154 06/30/2019 0634   PLT 205 06/04/2019 1036    BMET    Component Value Date/Time   NA 139 06/29/2019 0438   NA 141 06/04/2019 1036   K 4.1 06/29/2019 0438   CL 105 06/29/2019 0438   CO2 26 06/29/2019 0438   GLUCOSE 109 (H) 06/29/2019 0438   BUN 7 (L) 06/29/2019 0438   BUN 14 06/04/2019 1036   CREATININE 0.83 06/29/2019 0438   CREATININE 0.96 08/13/2015 0939   CALCIUM 7.8 (L) 06/29/2019 0438   GFRNONAA >60 06/29/2019 0438   GFRAA >60 06/29/2019 0438    COAG Lab Results  Component Value Date   INR 1.8 (H) 06/24/2019   INR 1.4 (H) 06/18/2019   INR 1.6 (H) 06/14/2019   No results found for:  PTT  Antibiotics Anti-infectives (From admission, onward)   Start     Dose/Rate Route Frequency Ordered Stop   06/18/19 1730  clindamycin (CLEOCIN) IVPB 300 mg     300 mg 100 mL/hr over 30 Minutes Intravenous Every 8 hours 06/18/19 1305 06/19/19 0131   06/18/19 0606  vancomycin (VANCOCIN) IVPB 1000 mg/200 mL premix     1,000 mg 200 mL/hr over 60 Minutes Intravenous 60 min pre-op 06/18/19 0606 06/18/19 0741       V. Leia Alf, M.D., [facility] [place] Office: [phone] Pager:  [phone]

## 2019-06-30 NOTE — Progress Notes (Signed)
    Subjective  -   Patient began c/o sudden onset of left arm pain.  Duplex shows 3x5 cm pseudoaneurysm.  She is having a significant amopuntof pain and numbness in her fingers,  Manual pressure is being held   Physical Exam:  Tenderness over left brachial artery       Assessment/Plan:    Discussed with patient and family at bedside that due to pain and numbness, I feel that this needs to be repaired surgically.  We will plan to goto the OR emergently.  Wells Renley Banwart 06/30/2019 6:32 PM --  Vitals:   06/30/19 1600 06/30/19 1700  BP: (!) 122/53 (!) 119/54  Pulse: 65 90  Resp: 16 16  Temp:    SpO2: 100% 100%    Intake/Output Summary (Last 24 hours) at 06/30/2019 1832 Last data filed at 06/30/2019 1700 Gross per 24 hour  Intake 204.6 ml  Output 740 ml  Net -535.4 ml     Laboratory CBC    Component Value Date/Time   WBC 7.5 06/30/2019 0634   HGB 7.8 (L) 06/30/2019 0634   HGB 13.4 06/04/2019 1036   HCT 25.8 (L) 06/30/2019 0634   HCT 41.7 06/04/2019 1036   PLT 154 06/30/2019 0634   PLT 205 06/04/2019 1036    BMET    Component Value Date/Time   NA 139 06/29/2019 0438   NA 141 06/04/2019 1036   K 4.1 06/29/2019 0438   CL 105 06/29/2019 0438   CO2 26 06/29/2019 0438   GLUCOSE 109 (H) 06/29/2019 0438   BUN 7 (L) 06/29/2019 0438   BUN 14 06/04/2019 1036   CREATININE 0.83 06/29/2019 0438   CREATININE 0.96 08/13/2015 0939   CALCIUM 7.8 (L) 06/29/2019 0438   GFRNONAA >60 06/29/2019 0438   GFRAA >60 06/29/2019 0438    COAG Lab Results  Component Value Date   INR 1.8 (H) 06/24/2019   INR 1.4 (H) 06/18/2019   INR 1.6 (H) 06/14/2019   No results found for: PTT  Antibiotics Anti-infectives (From admission, onward)   Start     Dose/Rate Route Frequency Ordered Stop   06/18/19 1730  clindamycin (CLEOCIN) IVPB 300 mg     300 mg 100 mL/hr over 30 Minutes Intravenous Every 8 hours 06/18/19 1305 06/19/19 0131   06/18/19 0606  vancomycin (VANCOCIN) IVPB  1000 mg/200 mL premix     1,000 mg 200 mL/hr over 60 Minutes Intravenous 60 min pre-op 06/18/19 0606 06/18/19 0741       V. Leia Alf, M.D., [facility] [place] Office: [phone] Pager:  [phone]

## 2019-06-30 NOTE — Anesthesia Preprocedure Evaluation (Addendum)
Anesthesia Evaluation  Patient identified by MRN, date of birth, ID band Patient awake    Reviewed: Allergy & Precautions, H&P , NPO status , Patient's Chart, lab work & pertinent test results, Unable to perform ROS - Chart review onlyPreop documentation limited or incomplete due to emergent nature of procedure.  History of Anesthesia Complications Negative for: history of anesthetic complications  Airway Mallampati: II  TM Distance: >3 FB Neck ROM: Full    Dental  (+) Edentulous Lower   Pulmonary asthma , sleep apnea , COPD,  COPD inhaler, former smoker,    Pulmonary exam normal        Cardiovascular hypertension, Pt. on medications and Pt. on home beta blockers + CAD, + Past MI, + Peripheral Vascular Disease and +CHF  Normal cardiovascular exam   S/p impella removal 2 days ago  '21 Cath - 1. Severe multivessel CAD with total occlusion of the RCA, first diagonal, and first OM branches 2. 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 Moderate calcific proximal LAD stenosis unchanged from prior cath study 4. Distal RCA and LCx territories collateralized by branches of the   '21 TTE - Impella cannula 3.5-4.0 cm from AoV annulus, good position. EF 20 to 25%. LV demonstrates global hypokinesis. Trivial pericardial effusion is present. Mild TR.    Neuro/Psych  Headaches, PSYCHIATRIC DISORDERS Anxiety Depression    GI/Hepatic Neg liver ROS, GERD  Medicated and Controlled,  Endo/Other  negative endocrine ROS  Renal/GU negative Renal ROS     Musculoskeletal  (+) Arthritis ,   Abdominal   Peds  Hematology  (+) Blood dyscrasia, anemia ,   Anesthesia Other Findings   Reproductive/Obstetrics                          Anesthesia Physical  Anesthesia Plan  ASA: IV and emergent  Anesthesia Plan: Regional   Post-op Pain Management:    Induction:   PONV Risk Score and Plan: 1 and  Ondansetron, Treatment may vary due to age or medical condition and Midazolam  Airway Management Planned: Natural Airway and Simple Face Mask  Additional Equipment:   Intra-op Plan:   Post-operative Plan:   Informed Consent: I have reviewed the patients History and Physical, chart, labs and discussed the procedure including the risks, benefits and alternatives for the proposed anesthesia with the patient or authorized representative who has indicated his/her understanding and acceptance.     Only emergency history available  Plan Discussed with: CRNA, Surgeon and Anesthesiologist  Anesthesia Plan Comments: (Patient ate meal approximately 1 hour prior to case. Given tenuous cardiac status and inadequate NPO status, surgeon agreeable to regional anesthesia as primary anesthetic. Discussed with patient that she would be receiving minimal sedation due to NPO status, but would be comfortable with dense nerve block. Patient expressed understanding and wished to proceed with this plan, saving GETA with RSI as backup in case of inadequate anesthesia from nerve block.)     Anesthesia Quick Evaluation

## 2019-06-30 NOTE — Anesthesia Procedure Notes (Addendum)
Anesthesia Regional Block: Interscalene brachial plexus block   Pre-Anesthetic Checklist: ,, timeout performed, Correct Patient, Correct Site, Correct Laterality, Correct Procedure, Correct Position, site marked, Risks and benefits discussed,  Surgical consent,  Pre-op evaluation,  At surgeon's request and post-op pain management  Laterality: Left  Prep: chloraprep       Needles:  Injection technique: Single-shot  Needle Type: Echogenic Needle     Needle Length: 5cm  Needle Gauge: 21     Additional Needles:   Narrative:  Start time: 06/30/2019 6:59 PM End time: 06/30/2019 7:03 PM Injection made incrementally with aspirations every 5 mL.  Performed by: Personally  Anesthesiologist: Audry Pili, MD  Additional Notes: No pain on injection. No increased resistance to injection. Injection made in 5cc increments. Good needle visualization. Patient tolerated the procedure well.

## 2019-07-01 DIAGNOSIS — I7409 Other arterial embolism and thrombosis of abdominal aorta: Secondary | ICD-10-CM | POA: Diagnosis not present

## 2019-07-01 DIAGNOSIS — R570 Cardiogenic shock: Secondary | ICD-10-CM | POA: Diagnosis not present

## 2019-07-01 LAB — BASIC METABOLIC PANEL
Anion gap: 8 (ref 5–15)
BUN: 9 mg/dL (ref 8–23)
CO2: 28 mmol/L (ref 22–32)
Calcium: 8.1 mg/dL — ABNORMAL LOW (ref 8.9–10.3)
Chloride: 105 mmol/L (ref 98–111)
Creatinine, Ser: 0.73 mg/dL (ref 0.44–1.00)
GFR calc Af Amer: >60 mL/min (ref >60)
GFR calc non Af Amer: >60 mL/min (ref >60)
Glucose, Bld: 142 mg/dL — ABNORMAL HIGH (ref 70–99)
Potassium: 4.3 mmol/L (ref 3.5–5.1)
Sodium: 141 mmol/L (ref 135–145)

## 2019-07-01 LAB — CBC WITH DIFFERENTIAL/PLATELET
Abs Immature Granulocytes: 0.14 10*3/uL — ABNORMAL HIGH (ref 0.00–0.07)
Basophils Absolute: 0 10*3/uL (ref 0.0–0.1)
Basophils Relative: 0 %
Eosinophils Absolute: 0 10*3/uL (ref 0.0–0.5)
Eosinophils Relative: 0 %
HCT: 25.2 % — ABNORMAL LOW (ref 36.0–46.0)
Hemoglobin: 7.5 g/dL — ABNORMAL LOW (ref 12.0–15.0)
Immature Granulocytes: 2 %
Lymphocytes Relative: 4 %
Lymphs Abs: 0.4 10*3/uL — ABNORMAL LOW (ref 0.7–4.0)
MCH: 28.3 pg (ref 26.0–34.0)
MCHC: 29.8 g/dL — ABNORMAL LOW (ref 30.0–36.0)
MCV: 95.1 fL (ref 80.0–100.0)
Monocytes Absolute: 0.1 10*3/uL (ref 0.1–1.0)
Monocytes Relative: 1 %
Neutro Abs: 8.9 10*3/uL — ABNORMAL HIGH (ref 1.7–7.7)
Neutrophils Relative %: 93 %
Platelets: 175 10*3/uL (ref 150–400)
RBC: 2.65 MIL/uL — ABNORMAL LOW (ref 3.87–5.11)
RDW: 17.2 % — ABNORMAL HIGH (ref 11.5–15.5)
WBC: 9.5 10*3/uL (ref 4.0–10.5)
nRBC: 0 % (ref 0.0–0.2)

## 2019-07-01 LAB — GLUCOSE, CAPILLARY
Glucose-Capillary: 124 mg/dL — ABNORMAL HIGH (ref 70–99)
Glucose-Capillary: 123 mg/dL — ABNORMAL HIGH (ref 70–99)
Glucose-Capillary: 168 mg/dL — ABNORMAL HIGH (ref 70–99)
Glucose-Capillary: 67 mg/dL — ABNORMAL LOW (ref 70–99)
Glucose-Capillary: 100 mg/dL — ABNORMAL HIGH (ref 70–99)

## 2019-07-01 LAB — HEPARIN LEVEL (UNFRACTIONATED): Heparin Unfractionated: 2 [IU]/mL — ABNORMAL HIGH (ref 0.30–0.70)

## 2019-07-01 LAB — COOXEMETRY PANEL
Carboxyhemoglobin: 1.7 % — ABNORMAL HIGH (ref 0.5–1.5)
Methemoglobin: 0.6 % (ref 0.0–1.5)
O2 Saturation: 53.6 %
Total hemoglobin: 7.5 g/dL — ABNORMAL LOW (ref 12.0–16.0)

## 2019-07-01 LAB — MAGNESIUM: Magnesium: 1.3 mg/dL — ABNORMAL LOW (ref 1.7–2.4)

## 2019-07-01 LAB — BLOOD PRODUCT ORDER (VERBAL) VERIFICATION

## 2019-07-01 MED ORDER — MAGNESIUM SULFATE 4 GM/100ML IV SOLN
4.0000 g | Freq: Once | INTRAVENOUS | Status: AC
  Administered 2019-07-01: 4 g via INTRAVENOUS
  Filled 2019-07-01: qty 100

## 2019-07-01 MED ORDER — HEPARIN (PORCINE) 25000 UT/250ML-% IV SOLN
1050.0000 [IU]/h | INTRAVENOUS | Status: DC
  Administered 2019-07-01 – 2019-07-02 (×2): 900 [IU]/h via INTRAVENOUS
  Administered 2019-07-03 – 2019-07-05 (×3): 950 [IU]/h via INTRAVENOUS
  Administered 2019-07-06: 1000 [IU]/h via INTRAVENOUS
  Administered 2019-07-08: 1050 [IU]/h via INTRAVENOUS
  Filled 2019-07-01 (×6): qty 250

## 2019-07-01 MED ORDER — FUROSEMIDE 40 MG PO TABS
40.0000 mg | ORAL_TABLET | Freq: Every day | ORAL | Status: DC
  Administered 2019-07-01: 40 mg via ORAL
  Filled 2019-07-01: qty 1

## 2019-07-01 MED ORDER — LOSARTAN POTASSIUM 25 MG PO TABS
12.5000 mg | ORAL_TABLET | Freq: Every day | ORAL | Status: DC
  Administered 2019-07-01: 12.5 mg via ORAL
  Filled 2019-07-01 (×3): qty 1

## 2019-07-01 NOTE — Progress Notes (Signed)
Rehab Admissions Coordinator Note:  Patient was screened by Cleatrice Burke for appropriateness for an Inpatient Acute Rehab Consult per therapy re eval.   At this time, we are recommending Inpatient Rehab consult. I will place order.  Cleatrice Burke RN MSN 07/01/2019, 4:34 PM  I can be reached at [phone].

## 2019-07-01 NOTE — Progress Notes (Addendum)
ANTICOAGULATION CONSULT NOTE - Initial Consult  Pharmacy Consult for heparin Indication: atrial fibrillation  Allergies  Allergen Reactions  . Benadryl [Diphenhydramine] Shortness Of Breath  . Penicillins Diarrhea    Did it involve swelling of the face/tongue/throat, SOB, or low BP? No Did it involve sudden or severe rash/hives, skin peeling, or any reaction on the inside of your mouth or nose? No Did you need to seek medical attention at a hospital or doctor's office? No When did it last happen?2 months If all above answers are "NO", may proceed with cephalosporin use.  . Adhesive [Tape] Other (See Comments)    Burning of skin  . Amoxicillin Diarrhea and Other (See Comments)    Has patient had a PCN reaction causing immediate rash, facial/tongue/throat swelling, SOB or lightheadedness with hypotension: No Has patient had a PCN reaction causing severe rash involving mucus membranes or skin necrosis: No Has patient had a PCN reaction that required hospitalization No Has patient had a PCN reaction occurring within the last 10 years: Yes -- noted rxn as diarrhea If all of the above answers are "NO", then may proceed with Cephalosporin use.   . Cyclobenzaprine Other (See Comments)    Causes restless leg syndrome Restless syndrome  . Hydrocodone Itching    Patient Measurements: Height: 5\' 1"  (154.9 cm) Weight: 73 kg (160 lb 15 oz) IBW/kg (Calculated) : 47.8 Heparin Dosing Weight: 73 kg   Vital Signs: Temp: 97.5 F (36.4 C) (04/26 0700) Temp Source: Oral (04/26 0700) BP: 93/47 (04/26 0600) Pulse Rate: 62 (04/26 0600)  Labs: Recent Labs    06/28/19 2356 06/29/19 0438 06/29/19 0438 06/30/19 0634 07/01/19 0352  HGB  --  7.6*   < > 7.8* 7.5*  HCT  --  24.3*  --  25.8* 25.2*  PLT  --  141*  --  154 175  HEPARINUNFRC <0.10* <0.10*  --   --   --   CREATININE  --  0.83  --   --  0.73   < > = values in this interval not displayed.    Estimated Creatinine Clearance:  62.4 mL/min (by C-G formula based on SCr of 0.73 mg/dL).   Medical History: Past Medical History:  Diagnosis Date  . Anemia   . Anginal pain ([place])    jaw pain 07/2016, s/p 07/15/16 nuclear stress test  . Anxiety   . Arthritis    "lower back" (08/24/2017)  . Asthma   . CAD (coronary artery disease), native coronary artery    a. Hx CABG 1998, last PCI 2013 b.LHC 07/2013:  EF 55-60%, L-LAD prob atretic, no sig disease in LAD, S-OM1 ok with patent stent, S-dRCA ok => med Rx. c. LHC 01/2015 DES to SVG-RCA.  Marland Kitchen Chronic back pain   . Chronic leg pain   . Chronic lower back pain   . COPD (chronic obstructive pulmonary disease) ([place])   . Depression   . DVT (deep venous thrombosis) ([facility]) X 3   RLE  . Fall 08/2016  . GERD (gastroesophageal reflux disease)   . Glucose intolerance (impaired glucose tolerance)   . Headache    "a few/month" (08/24/2017)  . HLD (hyperlipidemia)   . Hypertension   . Myocardial infarction ([place]) 1999  . OSA on CPAP   . PAD (peripheral artery disease) ([place])   . PAF (paroxysmal atrial fibrillation) ([place])    a. 09/2013 post-op b. 01/2015  . Peripheral neuropathy   . Pneumonia   . Shortness of breath  when walking  . Wears glasses     Medications:  Scheduled:  . sodium chloride   Intravenous Once  . amiodarone  200 mg Oral BID  . aspirin  81 mg Oral Daily  . atorvastatin  80 mg Oral Daily  . buPROPion  150 mg Oral Daily  . Chlorhexidine Gluconate Cloth  6 each Topical Daily  . digoxin  0.125 mg Oral Daily  . ezetimibe  10 mg Oral Daily  . furosemide  40 mg Oral Daily  . insulin aspart  0-9 Units Subcutaneous Q4H  . LORazepam  0.5 mg Intravenous Once  . losartan  12.5 mg Oral Daily  . mupirocin ointment  1 application Nasal BID  . pantoprazole  40 mg Oral Daily  . pramipexole  0.25 mg Oral QHS  . sertraline  150 mg Oral QHS  . sodium chloride flush  10-40 mL Intracatheter Q12H  . sodium chloride flush  3 mL Intravenous Q12H  . spironolactone  25 mg  Oral Daily    Assessment: 80 yof s/p aortobifemoral bypass and impella 5.5. 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   Okay per vascular to restart heparin infusion today. Hgb 7.8, plt 154. LDH 243. No s/sx of bleeding.   Goal of Therapy:  Heparin level 0.3-0.7 units/ml Monitor platelets by anticoagulation protocol: Yes   Plan:  Restart heparin infusion at 900 units/hr Monitor heparin level in 6 hours  Monitor CBC, HL, and for s/sx of bleeding  Antonietta Jewel, PharmD, [place] Pharmacist  Phone: [phone] 07/01/2019 8:27 AM  Please check AMION for all [place] phone numbers After 10:00 PM, call [place] [phone]  ADDENDUM Heparin level came back supratherapeutic at 2.0, on 900 units/hr. Discussed with nursing and lab - concern that line infiltrated at spot where drawn (has to be drawn from same arm as heparin infusion given restriction). Hgb 7.5, plt 175. No s/sx of bleeding. Level will be drawn.  Antonietta Jewel, PharmD, [place] Clinical Pharmacist  Phone: [phone]

## 2019-07-01 NOTE — Progress Notes (Signed)
Occupational Therapy Treatment Patient Details Name: Wendy [name] MRN: [id number] DOB: Jul 06, 1950 Today's Date: 07/01/2019    History of present illness Pt is a 69yo female s/p aortobifemoral bypass on 4/13, Impella 5.5 placement 4/19, Extubated 4/20, 4/23 impella removed, 4/25 S/P Emergent Repair of Left Brachial Artery Pseudoaneurysm. PMHx: L to R femorofemoral bypass. PMHx: COPD, CAD, HTN, past MI, PVDx, cardiac stents.   OT comments  Pt seen s/p L brachial a. Sx. LUE now in bulky dressing, edema in LUE. Pt more alert today. Pt limtited by self limiting behavior, decreased activity tolerance, decreased mobility and decreased ability to care for self. Pt modA +2 for sit to stands and taking steps toward [facility] with LUE supported by therapist. Light ROM to shoulder with PROM to AAROM and AROM to digits for GMC/FMC.  Pt able to stand  <1 min for pericare. Pt would greatly benefit from continued OT skilled services for ADL, mobility and safety.   BP: lying: 94/46, sitting109/50; sitting after rest break.114/60. O2 on 1L >99% O2.   Follow Up Recommendations  CIR    Equipment Recommendations  3 in 1 bedside commode;Other (comment)(defer to next venue)    Recommendations for Other Services      Precautions / Restrictions Precautions Precautions: Fall;Other (comment) Precaution Comments: wach O2 Restrictions Weight Bearing Restrictions: Yes LUE Weight Bearing: Non weight bearing Other Position/Activity Restrictions: LUE splinted above elbow to wwrist       Mobility Bed Mobility Overal bed mobility: Needs Assistance Bed Mobility: Sit to Supine Rolling: Mod assist;+2 for physical assistance;+2 for safety/equipment Sidelying to sit: Mod assist;+2 for physical assistance;+2 for safety/equipment   Sit to supine: Max assist;+2 for physical assistance;+2 for safety/equipment   General bed mobility comments: Pt requires cues for LUE to be supported as pt with decreased control and  sensation in it.  Transfers Overall transfer level: Needs assistance Equipment used: 2 person hand held assist;Rolling walker (2 wheeled) Transfers: Sit to/from Stand Sit to Stand: Mod assist;+2 physical assistance;+2 safety/equipment         General transfer comment: pt requiring cues for LUE     Balance   Sitting-balance support: Single extremity supported Sitting balance-Leahy Scale: Poor Sitting balance - Comments: Pt too short to reach floor on bed at EOB Postural control: Posterior lean   Standing balance-Leahy Scale: Poor                             ADL either performed or assessed with clinical judgement   ADL Overall ADL's : Needs assistance/impaired     Grooming: Set up;Bed level Grooming Details (indicate cue type and reason): Pt too fatigued to perform at EOB and too unsteady to let og single UE able to wt bear.                 Toilet Transfer: Moderate assistance;+2 for physical assistance;BSC   Toileting- Clothing Manipulation and Hygiene: Maximal assistance;Sitting/lateral lean;Sit to/from stand Toileting - Clothing Manipulation Details (indicate cue type and reason): standing <1 min for pericare     Functional mobility during ADLs: Moderate assistance;+2 for physical assistance;+2 for safety/equipment;Rolling walker General ADL Comments: Pt more alert today. Pt limtited by self limiting behavior, decreased activity tolerance, decreased mobility and decreased ability to care for self.     Vision   Additional Comments: wears glasses at all times   Perception     Praxis      Cognition Arousal/Alertness: Awake/alert  Behavior During Therapy: Flat affect Overall Cognitive Status: No family/caregiver present to determine baseline cognitive functioning                                 General Comments: follows commands; pt very particular stating "I am not getting OOB."        Exercises     Shoulder Instructions        General Comments BP: lying: 94/46, sitting109/50; sitting after rest break.114/60. O2 on 1L >99% O2.    Pertinent Vitals/ Pain       Pain Assessment: Faces Faces Pain Scale: Hurts even more Pain Location: generalized Pain Descriptors / Indicators: Grimacing;Aching Pain Intervention(s): Monitored during session  Home Living Family/patient expects to be discharged to:: Private residence Living Arrangements: Other relatives Available Help at Discharge: Family;Available 24 hours/day Type of Home: Mobile home Home Access: Ramped entrance     Home Layout: One level     Bathroom Shower/Tub: Teacher, early years/pre: Standard     Home Equipment: Environmental consultant - 2 wheels;Walker - 4 wheels;Cane - quad;Bedside commode          Prior Functioning/Environment Level of Independence: Independent with assistive device(s)        Comments: multiple falls, using cane   Frequency  Min 2X/week        Progress Toward Goals  OT Goals(current goals can now be found in the care plan section)  Progress towards OT goals: Progressing toward goals  Acute Rehab OT Goals Patient Stated Goal: to feel stronger OT Goal Formulation: With patient Time For Goal Achievement: 07/15/19 Potential to Achieve Goals: Good ADL Goals Pt Will Perform Grooming: with min assist;standing Pt Will Perform Lower Body Bathing: with mod assist;sitting/lateral leans;sit to/from stand;with adaptive equipment Pt Will Perform Lower Body Dressing: with min assist;with adaptive equipment;sitting/lateral leans;sit to/from stand Pt Will Transfer to Toilet: with min assist;ambulating;bedside commode Additional ADL Goal #1: Pt will recall and apply 1-3 ECS strategies to BADL activity Additional ADL Goal #2: Pt will improve to minguardA for bed mobility as a precursor for OOB ADL tasks.  Plan Discharge plan remains appropriate    Co-evaluation    PT/OT/SLP Co-Evaluation/Treatment: Yes     OT goals  addressed during session: ADL's and self-care      AM-PAC OT "6 Clicks" Daily Activity     Outcome Measure   Help from another person eating meals?: Total Help from another person taking care of personal grooming?: A Lot Help from another person toileting, which includes using toliet, bedpan, or urinal?: Total Help from another person bathing (including washing, rinsing, drying)?: A Lot Help from another person to put on and taking off regular upper body clothing?: A Lot Help from another person to put on and taking off regular lower body clothing?: Total 6 Click Score: 9    End of Session Equipment Utilized During Treatment: Gait belt;Rolling walker;Oxygen  OT Visit Diagnosis: Unsteadiness on feet (R26.81);Muscle weakness (generalized) (M62.81)   Activity Tolerance Patient tolerated treatment well;Patient limited by fatigue   Patient Left in bed;with call bell/phone within reach;with bed alarm set   Nurse Communication Mobility status        Time: [id number] OT Time Calculation (min): 36 min  Charges: OT General Charges $OT Visit: 1 Visit OT Treatments $Therapeutic Activity: 8-22 mins  Jefferey Pica, OTR/L Acute Rehabilitation Services Pager: [phone] Office: [phone]    Creig Landin C 07/01/2019, 1:52 PM

## 2019-07-01 NOTE — Progress Notes (Addendum)
Patient ID: Wendy [name], female   DOB: 11/21/1950, 69 y.o.   MRN: [id number]     Advanced Heart Failure Rounding Note  PCP-Cardiologist: [name], MD   Subjective:    - 4/13 aortobifemoral bypass - Impella 5.5 placement 4/19 for cardiogenic shock.  - Extubated 4/20 - LHC 4/22  (left brachial access): All grafts occluded (known), totally occluded RCA, OM1, OM3, 80% dLCx, 60% pLAD.  No change, no intervention.  - Impella 5.5 removed 4/23 - 4/25 S/P Emergent Repair of Left Brachial Artery Pseudoaneurysm  Yesterday entresto stopped due to hypotension/over diuresis. Received 250 NS.   Denies SOB. Anxious about moving out of ICU.   Objective:   Weight Range: 73 kg Body mass index is 30.41 kg/m.   Vital Signs:   Temp:  [97.7 F (36.5 C)-97.8 F (36.6 C)] 97.8 F (36.6 C) (04/25 1542) Pulse Rate:  [60-90] 62 (04/26 0600) Resp:  [0-21] 15 (04/26 0600) BP: (91-130)/(45-62) 93/47 (04/26 0600) SpO2:  [91 %-100 %] 100 % (04/26 0600) Last BM Date: 06/30/19  Weight change: Filed Weights   06/26/19 0615 06/27/19 0500 06/28/19 0527  Weight: 78.3 kg 75.5 kg 73 kg    Intake/Output:   Intake/Output Summary (Last 24 hours) at 07/01/2019 0726 Last data filed at 06/30/2019 2100 Gross per 24 hour  Intake 800 ml  Output 300 ml  Net 500 ml      Physical Exam   General:  Pale. In bed.  No resp difficulty HEENT: normal Neck: supple. JVP 8-9 . Carotids 2+ bilat; no bruits. No lymphadenopathy or thryomegaly appreciated. Cor: PMI nondisplaced. Regular rate & rhythm. No rubs, gallops or murmurs. Lungs: clear Abdomen: soft, nontender, nondistended. No hepatosplenomegaly. No bruits or masses. Good bowel sounds. Extremities: no cyanosis, clubbing, rash, LUE ace wrap . Doppler L radial pulse.  Neuro: alert & orientedx3, cranial nerves grossly intact. moves all 4 extremities w/o difficulty. Affect flat    Telemetry   NSR 60s   Labs    CBC Recent Labs    06/30/19 0634  07/01/19 0352  WBC 7.5 9.5  NEUTROABS 5.7 8.9*  HGB 7.8* 7.5*  HCT 25.8* 25.2*  MCV 94.9 95.1  PLT 154 0000000   Basic Metabolic Panel Recent Labs    06/29/19 0438 07/01/19 0352  NA 139 141  K 4.1 4.3  CL 105 105  CO2 26 28  GLUCOSE 109* 142*  BUN 7* 9  CREATININE 0.83 0.73  CALCIUM 7.8* 8.1*  MG  --  1.3*   Liver Function Tests Recent Labs    06/29/19 0438  AST 21  ALT 19  ALKPHOS 67  BILITOT 0.8  PROT 4.5*  ALBUMIN 2.0*   No results for input(s): LIPASE, AMYLASE in the last 72 hours. Cardiac Enzymes No results for input(s): CKTOTAL, CKMB, CKMBINDEX, TROPONINI in the last 72 hours.  BNP: BNP (last 3 results) Recent Labs    06/19/19 1239  BNP 366.7*    ProBNP (last 3 results) No results for input(s): PROBNP in the last 8760 hours.   D-Dimer No results for input(s): DDIMER in the last 72 hours. Hemoglobin A1C No results for input(s): HGBA1C in the last 72 hours. Fasting Lipid Panel No results for input(s): CHOL, HDL, LDLCALC, TRIG, CHOLHDL, LDLDIRECT in the last 72 hours. Thyroid Function Tests No results for input(s): TSH, T4TOTAL, T3FREE, THYROIDAB in the last 72 hours.  Invalid input(s): FREET3  Other results:   Imaging    VAS [place] UPPER EXTREMITY ARTERIAL DUPLEX  Result Date: 06/30/2019 UPPER EXTREMITY DUPLEX STUDY Indications: Patient complains of pain at brachial cath site.  Limitations: Pain Comparison Study: No prior study on file Performing Technologist: Sharion Dove RVS  Examination Guidelines: A complete evaluation includes B-mode imaging, spectral Doppler, color Doppler, and power Doppler as needed of all accessible portions of each vessel. Bilateral testing is considered an integral part of a complete examination. Limited examinations for reoccurring indications may be performed as noted.  Summary:  Left: Partially thrombosed psuedoaneurysm noted at the site of       catherization measuring 3.2cm X 4.8cm and a neck measuring       2.6cm  wide. Unable to image the length of the neck. *See table(s) above for measurements and observations.    Preliminary      Medications:     Scheduled Medications: . sodium chloride   Intravenous Once  . amiodarone  200 mg Oral BID  . aspirin  81 mg Oral Daily  . atorvastatin  80 mg Oral Daily  . buPROPion  150 mg Oral Daily  . Chlorhexidine Gluconate Cloth  6 each Topical Daily  . digoxin  0.125 mg Oral Daily  . ezetimibe  10 mg Oral Daily  . insulin aspart  0-9 Units Subcutaneous Q4H  . LORazepam  0.5 mg Intravenous Once  . mupirocin ointment  1 application Nasal BID  . pantoprazole  40 mg Oral Daily  . pramipexole  0.25 mg Oral QHS  . sertraline  150 mg Oral QHS  . sodium chloride flush  10-40 mL Intracatheter Q12H  . sodium chloride flush  3 mL Intravenous Q12H  . spironolactone  25 mg Oral Daily    Infusions: . sodium chloride Stopped (06/28/19 1336)  . sodium chloride    . lactated ringers Stopped (06/29/19 1736)    PRN Medications: sodium chloride, sodium chloride, acetaminophen **OR** acetaminophen, ALPRAZolam, alum & mag hydroxide-simeth, diphenhydrAMINE **OR** diphenhydrAMINE, docusate sodium, fentaNYL (SUBLIMAZE) injection, gabapentin, guaiFENesin-dextromethorphan, metoprolol tartrate, nitroGLYCERIN, ondansetron, oxyCODONE-acetaminophen, phenol, polyethylene glycol, sodium chloride flush, sodium chloride flush   Assessment/Plan   1. Cardiogenic shock: Progressive worsening 4/19 culminating in PEA arrest.  Lactate 8.1.  She had episodes of hypotension and atrial fibrillation/RVR earlier in her post-op course as well.  Symptoms were dyspnea and anxiety no chest pain.  No STEMI on ECG but concern for possible coronary event based on history.  Initial troponin 1802 => 2957.  Initially on NE, epi then Impella 5.5 placed on 4/19.  Now MAP is stable and off pressors.  Lactate has normalized. Impella 5.5 out 4/23.  - Co-ox 54%.  2. Acute systolic CHF: Suspect ischemic  cardiomyopathy.  Echo 06/24/19 EF 20-25%, diffuse hypokinesis, preserved RV function.  Pre-op echo 06/17/19 EF 60%.  HS-TnI 1802 => 2957, elevated but not markedly high.  [place] 4/22 showed unchanged coronary anatomy, suspect patient got ischemic with hypotension. - Impella 5.5 out 4/23.  - CO-OX 54% off all drips. Mild volume overload.  - Off entresto 4/25 due hypotension .  - Continue digoxin  - Continue spironolactone 25 mg daily.   - No b-blocker yet with marginal co-ox and low BP  3. AKI: Creatinine as high as 2.86, now normalized.    4. CAD: LHC in 3/21 showed occluded SVG-RCA and SVG-OM and atretic LIMA-LAD.  The LAD was patent with 60% mid vessel stenosis, the RCA was occluded with collaterals, and OM1 and OM2 were occluded.  No intervention.  Surprisingly, EF reported as normal pre-op.  No chest  pain but had dyspnea and anxiety today.  ECG did not show STEMI, showed fairly stable lateral TWIs.  HS-TnI to 2957. [place] 4/22 showed unchanged coronary anatomy, suspect she was ischemic with hypotension.  No interventional target.  Left brachial site benign and radial pulse can be faintly dopplered.  - Continue statin, ASA for now. Will eventually stop ASA as she will need long-term oral anticoagulation (Eliquis) unless vascular wants an antiplatelet agent post-aortobifemoral bypass.  5. PAD: S/p aortobifemoral bypass on 4/13.  Surgical site is stable, she has had palpable PT pulses.  6. Acute hypoxemic respiratory failure: Intubated with PEA arrest. Resolved, extubated.  Chest still sore from CPR. Resoved 7. Atrial fibrillation: Paroxysmal.  She has has been in and out of atrial fibrillation post-op.  Has had episodes of junctional rhythm also.  Maintaining NSR.  - Continue amio 200 mg twice a day. - Start heparin drip today.  Discussed with Dr Donzetta Matters at the bedside.   - Eventually to Eliquis alone.  8. Thrombocytopenia: Post-Impella placement. Plts up to 175  9. Diarrhea/abdominal discomfort:   Resolved 10. Anemia, post-op - hgb 7.6 -> 7.8->7.5. Transfuse < 7.5 11. CP - ECG improved. Doubt ischemic - ? Related to CPR 12. Deconditioning - Continue PT/OT 13. Left Brachial Pseudoaneursym - 4/25 S/P Emergent Repair of Left Brachial Artery Pseudoaneurysm 14. Hypomagnesemia  - Mag 1.4  - Give 4 grams Mag  Ok to move out of ICU. Eventually start eliquis. Per VVS start heparin and will switch in a few days as long as she remains stable.   Length of Stay: [place], NP  07/01/2019, 7:26 AM  Advanced Heart Failure Team Pager [phone] (M-F; 7a - 4p)  Please contact [facility] for night-coverage after hours (4p -7a ) and weekends on [email or web address]  Patient seen with NP, agree with the above note.   Stable today.  Still with some pain at left arm site (repair of brachial PSA).  CVP not hooked up.  Co-ox marginal at 54%. SBP 90s-100s.    General: NAD Neck: JVP 8-9 cm, no thyromegaly or thyroid nodule.  Lungs: Clear to auscultation bilaterally with normal respiratory effort. CV: Nondisplaced PMI.  Heart regular S1/S2, no S3/S4, no murmur.  No peripheral edema.   Abdomen: Soft, nontender, no hepatosplenomegaly, no distention.  Skin: Intact without lesions or rashes.  Neurologic: Alert and oriented x 3.  Psych: Normal affect. Extremities: No clubbing or cyanosis. S/p left arm brachial PSA repair.  HEENT: Normal.   Stable this morning, suspect mild volume overload.  - Monitor CVP.  - Add losartan 12.5 daily (BP soft for Entresto).  - Resume Lasix 40 mg po daily.   Remains in NSR.  Will need Eliquis for atrial fibrillation.  - Per vascular, start heparin gtt today with eventual Eliquis (post-brachial PSA repair yesterday).   Work with PT.   Loralie Champagne 07/01/2019 7:59 AM

## 2019-07-01 NOTE — Evaluation (Signed)
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 PMHx: L to R femorofemoral bypass. PMHx: COPD, CAD, HTN, past MI, PVDx, cardiac stents.  Clinical Impression  Completed re-eval today since her medical decline as noted above.  She presents with decreased activity tolerance, decreased strength, decreased balance, decreased cardiorespiratory endurance all limiting independence with mobility.  She needs mod A overall with 2 person assist for safety.  She will benefit from continued skilled PT in the acute setting and likely will need follow up CIR level rehab prior to d/c home with family support.      Follow Up Recommendations CIR    Equipment Recommendations  Other (comment)(TBA)    Recommendations for Other Services Rehab consult     Precautions / Restrictions Precautions Precautions: Fall Precaution Comments: wach O2 Restrictions Weight Bearing Restrictions: Yes RUE Weight Bearing: Weight bearing as tolerated LUE Weight Bearing: Non weight bearing Other Position/Activity Restrictions: LUE splinted above elbow to wwrist      Mobility  Bed Mobility Overal bed mobility: Needs Assistance Bed Mobility: Supine to Sit Rolling: Mod assist;+2 for safety/equipment Sidelying to sit: Mod assist;+2 for physical assistance;+2 for safety/equipment Supine to sit: Mod assist;+2 for safety/equipment Sit to supine: Max assist;+2 for safety/equipment   General bed mobility comments: able to move her legs off bed, assist to lift trunk and scoot hips, to supine assist for legs onto bed and positioning in bed  Transfers Overall transfer level: Needs assistance Equipment used: Rolling  walker (2 wheeled) Transfers: Sit to/from Stand Sit to Stand: Mod assist;+2 safety/equipment         General transfer comment: assist to place L UE onto walker and for managing it with sit<>stand; side steps to [facility] with min to mod A +2 for safety  Ambulation/Gait             General Gait Details: refused ambulation and OOB to chair,but did stand for peri care and stepping up in bed  Stairs            Wheelchair Mobility    Modified Rankin (Stroke Patients Only)       Balance Overall balance assessment: Needs assistance Sitting-balance support: Feet unsupported;Single extremity supported Sitting balance-Leahy Scale: Fair Sitting balance - Comments: Pt too short to reach floor on bed at EOB Postural control: Posterior lean Standing balance support: Bilateral upper extremity supported Standing balance-Leahy Scale: Poor Standing balance comment: Pt standing for pericare with minguardA                             Pertinent Vitals/Pain Pain Assessment: Faces Faces Pain Scale: Hurts even more Pain Location: back and L arm Pain Descriptors / Indicators: Moaning;Grimacing Pain Intervention(s): Monitored during session;Limited activity within patient's tolerance;Repositioned    Home Living Family/patient expects to be discharged to:: Private residence Living Arrangements: Other relatives Available Help at Discharge: Family;Available 24 hours/day Type of Home: Mobile home Home Access: Ramped entrance     Home Layout: One level Home Equipment: [place] - 2 wheels;Walker - 4 wheels;Cane - quad;Bedside commode      Prior Function Level of Independence: Independent with assistive device(s)         Comments:  multiple falls, using cane     Hand Dominance   Dominant Hand: Right    Extremity/Trunk Assessment   Upper Extremity Assessment Upper Extremity Assessment: Defer to OT evaluation LUE Deficits / Details: shoulder, PROM is WFLs, pt wiggling  digits and edema noted. LUE Coordination: decreased fine motor;decreased gross motor    Lower Extremity Assessment Lower Extremity Assessment: Generalized weakness    Cervical / Trunk Assessment Cervical / Trunk Assessment: Other exceptions Cervical / Trunk Exceptions: rounded shoulders  Communication   Communication: No difficulties  Cognition Arousal/Alertness: Awake/alert Behavior During Therapy: WFL for tasks assessed/performed Overall Cognitive Status: No family/caregiver present to determine baseline cognitive functioning                                 General Comments: follows commands; pt very particular stating "I am not getting OOB."      General Comments General comments (skin integrity, edema, etc.): BP lying 94/46, sitting 109/50, sitting after standing 114/60, O2 on 1L 99%    Exercises General Exercises - Upper Extremity Shoulder Flexion: AAROM;PROM;10 reps;Supine Digit Composite Flexion: AROM;20 reps;Supine Composite Extension: AROM;15 reps;Supine   Assessment/Plan    PT Assessment    PT Problem List Decreased strength;Decreased activity tolerance;Decreased balance;Decreased mobility;Pain;Decreased knowledge of use of DME;Decreased safety awareness;Cardiopulmonary status limiting activity       PT Treatment Interventions Gait training;Balance training;Functional mobility training;Therapeutic activities;Patient/family education;DME instruction;Therapeutic exercise    PT Goals (Current goals can be found in the Care Plan section)  Acute Rehab PT Goals Patient Stated Goal: to feel stronger PT Goal Formulation: With patient Time For Goal Achievement: 07/15/19 Potential to Achieve Goals: Good    Frequency Min 3X/week   Barriers to discharge        Co-evaluation PT/OT/SLP Co-Evaluation/Treatment: Yes Reason for Co-Treatment: Complexity of the patient's impairments (multi-system involvement);For patient/therapist safety;To address  functional/ADL transfers PT goals addressed during session: Mobility/safety with mobility;Balance;Proper use of DME OT goals addressed during session: ADL's and self-care       AM-PAC PT "6 Clicks" Mobility  Outcome Measure Help needed turning from your back to your side while in a flat bed without using bedrails?: A Lot Help needed moving from lying on your back to sitting on the side of a flat bed without using bedrails?: A Lot Help needed moving to and from a bed to a chair (including a wheelchair)?: A Lot Help needed standing up from a chair using your arms (e.g., wheelchair or bedside chair)?: A Lot Help needed to walk in hospital room?: Total Help needed climbing 3-5 steps with a railing? : Total 6 Click Score: 10    End of Session Equipment Utilized During Treatment: Oxygen Activity Tolerance: Patient limited by fatigue;Patient limited by pain Patient left: in bed;with call bell/phone within reach Nurse Communication: Mobility status PT Visit Diagnosis: Other abnormalities of gait and mobility (R26.89);Muscle weakness (generalized) (M62.81)    Time: [id number] PT Time Calculation (min) (ACUTE ONLY): 36 min   Charges:   PT Evaluation $PT Re-evaluation: [place], PT Acute Rehabilitation Services [phone] 07/01/2019   Reginia Naas 07/01/2019, 3:12 PM

## 2019-07-01 NOTE — Anesthesia Postprocedure Evaluation (Signed)
Anesthesia Post Note  Patient: Wendy [name]  Procedure(s) Performed: LEFT BRACHIAL ARTERY REPAIR, EVACUATION OF HEMATOMA (Left )     Patient location during evaluation: ICU Anesthesia Type: Regional Level of consciousness: awake and alert Pain management: pain level controlled Vital Signs Assessment: post-procedure vital signs reviewed and stable Respiratory status: spontaneous breathing, nonlabored ventilation and respiratory function stable Cardiovascular status: stable and blood pressure returned to baseline Anesthetic complications: no    Last Vitals:  Vitals:   06/30/19 2300 06/30/19 2317  BP:  (!) 106/54  Pulse: 67 70  Resp: 18 18  Temp:    SpO2:  99%    Last Pain:  Vitals:   06/30/19 1653  TempSrc:   PainSc: 2                  Audry Pili

## 2019-07-01 NOTE — Progress Notes (Addendum)
  Progress Note    07/01/2019 7:41 AM 1 Day Post-Op  Subjective:  L arm numbness; patient with appetite this morning   Vitals:   07/01/19 0500 07/01/19 0600  BP: (!) 96/47 (!) 93/47  Pulse: 68 62  Resp: (!) 8 15  Temp:    SpO2: 100% 100%   Physical Exam: Lungs:  Non labored on RA Incisions:  Midline and groin incisions nearly healed Extremities:  L arm dressing left in place this morning; palpable R radial; L radial, ulnar, and palmer arch by doppler; symmetrical DP pulses Abdomen:  Soft, NT, ND Neurologic: A&O  CBC    Component Value Date/Time   WBC 9.5 07/01/2019 0352   RBC 2.65 (L) 07/01/2019 0352   HGB 7.5 (L) 07/01/2019 0352   HGB 13.4 06/04/2019 1036   HCT 25.2 (L) 07/01/2019 0352   HCT 41.7 06/04/2019 1036   PLT 175 07/01/2019 0352   PLT 205 06/04/2019 1036   MCV 95.1 07/01/2019 0352   MCV 86 06/04/2019 1036   MCH 28.3 07/01/2019 0352   MCHC 29.8 (L) 07/01/2019 0352   RDW 17.2 (H) 07/01/2019 0352   RDW 17.4 (H) 06/04/2019 1036   LYMPHSABS 0.4 (L) 07/01/2019 0352   MONOABS 0.1 07/01/2019 0352   EOSABS 0.0 07/01/2019 0352   BASOSABS 0.0 07/01/2019 0352    BMET    Component Value Date/Time   NA 141 07/01/2019 0352   NA 141 06/04/2019 1036   K 4.3 07/01/2019 0352   CL 105 07/01/2019 0352   CO2 28 07/01/2019 0352   GLUCOSE 142 (H) 07/01/2019 0352   BUN 9 07/01/2019 0352   BUN 14 06/04/2019 1036   CREATININE 0.73 07/01/2019 0352   CREATININE 0.96 08/13/2015 0939   CALCIUM 8.1 (L) 07/01/2019 0352   GFRNONAA >60 07/01/2019 0352   GFRAA >60 07/01/2019 0352    INR    Component Value Date/Time   INR 1.8 (H) 06/24/2019 1458     Intake/Output Summary (Last 24 hours) at 07/01/2019 0741 Last data filed at 06/30/2019 2100 Gross per 24 hour  Intake 800 ml  Output 300 ml  Net 500 ml     Assessment/Plan:  69 y.o. female is s/p ABF bypass with post op cardiac evetn requiring impella; LHC with subsequent repair of L brachial artery 1 Day Post-Op   L  hand well perfused with radial, ulnar, and palmar arch by doppler BLE well perfused with symmetrical DP pulses PT/OT today; hopeful for CIR Restart IV heparin today; transition to Eliquis close to discharge Transfer to 4e   Dagoberto Ligas, PA-C [facility] [phone] 07/01/2019 7:41 AM   I have interviewed patient and examined her with PA and agree with assessment and plan above.  Overall is doing very well all incisions appear to be healing well at this time.  We will transfer her to the floor restart anticoagulation.  Deem Marmol C. Donzetta Matters, MD [facility] [place] Office: [phone] Pager: [phone]

## 2019-07-02 DIAGNOSIS — R570 Cardiogenic shock: Secondary | ICD-10-CM | POA: Diagnosis not present

## 2019-07-02 LAB — CBC WITH DIFFERENTIAL/PLATELET
Abs Immature Granulocytes: 0.09 10*3/uL — ABNORMAL HIGH (ref 0.00–0.07)
Basophils Absolute: 0 10*3/uL (ref 0.0–0.1)
Basophils Relative: 0 %
Eosinophils Absolute: 0.2 10*3/uL (ref 0.0–0.5)
Eosinophils Relative: 3 %
HCT: 20.5 % — ABNORMAL LOW (ref 36.0–46.0)
Hemoglobin: 6.3 g/dL — CL (ref 12.0–15.0)
Immature Granulocytes: 1 %
Lymphocytes Relative: 16 %
Lymphs Abs: 1.1 10*3/uL (ref 0.7–4.0)
MCH: 29 pg (ref 26.0–34.0)
MCHC: 30.7 g/dL (ref 30.0–36.0)
MCV: 94.5 fL (ref 80.0–100.0)
Monocytes Absolute: 0.4 10*3/uL (ref 0.1–1.0)
Monocytes Relative: 6 %
Neutro Abs: 5 10*3/uL (ref 1.7–7.7)
Neutrophils Relative %: 74 %
Platelets: 202 10*3/uL (ref 150–400)
RBC: 2.17 MIL/uL — ABNORMAL LOW (ref 3.87–5.11)
RDW: 17.8 % — ABNORMAL HIGH (ref 11.5–15.5)
WBC: 6.8 10*3/uL (ref 4.0–10.5)
nRBC: 0.4 % — ABNORMAL HIGH (ref 0.0–0.2)

## 2019-07-02 LAB — GLUCOSE, CAPILLARY
Glucose-Capillary: 105 mg/dL — ABNORMAL HIGH (ref 70–99)
Glucose-Capillary: 111 mg/dL — ABNORMAL HIGH (ref 70–99)
Glucose-Capillary: 91 mg/dL (ref 70–99)
Glucose-Capillary: 95 mg/dL (ref 70–99)
Glucose-Capillary: 97 mg/dL (ref 70–99)
Glucose-Capillary: 98 mg/dL (ref 70–99)

## 2019-07-02 LAB — BASIC METABOLIC PANEL
Anion gap: 11 (ref 5–15)
BUN: 9 mg/dL (ref 8–23)
CO2: 29 mmol/L (ref 22–32)
Calcium: 7.9 mg/dL — ABNORMAL LOW (ref 8.9–10.3)
Chloride: 101 mmol/L (ref 98–111)
Creatinine, Ser: 0.94 mg/dL (ref 0.44–1.00)
GFR calc Af Amer: >60 mL/min (ref >60)
GFR calc non Af Amer: >60 mL/min (ref >60)
Glucose, Bld: 96 mg/dL (ref 70–99)
Potassium: 3.8 mmol/L (ref 3.5–5.1)
Sodium: 141 mmol/L (ref 135–145)

## 2019-07-02 LAB — CBC
HCT: 31.4 % — ABNORMAL LOW (ref 36.0–46.0)
Hemoglobin: 10.1 g/dL — ABNORMAL LOW (ref 12.0–15.0)
MCH: 29.2 pg (ref 26.0–34.0)
MCHC: 32.2 g/dL (ref 30.0–36.0)
MCV: 90.8 fL (ref 80.0–100.0)
Platelets: 193 10*3/uL (ref 150–400)
RBC: 3.46 MIL/uL — ABNORMAL LOW (ref 3.87–5.11)
RDW: 17.2 % — ABNORMAL HIGH (ref 11.5–15.5)
WBC: 6.2 10*3/uL (ref 4.0–10.5)
nRBC: 0.8 % — ABNORMAL HIGH (ref 0.0–0.2)

## 2019-07-02 LAB — PREPARE RBC (CROSSMATCH)

## 2019-07-02 LAB — DIGOXIN LEVEL: Digoxin Level: 0.6 ng/mL — ABNORMAL LOW (ref 0.8–2.0)

## 2019-07-02 LAB — MAGNESIUM: Magnesium: 1.9 mg/dL (ref 1.7–2.4)

## 2019-07-02 LAB — HEPARIN LEVEL (UNFRACTIONATED): Heparin Unfractionated: 0.31 IU/mL (ref 0.30–0.70)

## 2019-07-02 MED ORDER — CHLORHEXIDINE GLUCONATE CLOTH 2 % EX PADS
6.0000 | MEDICATED_PAD | Freq: Every day | CUTANEOUS | Status: DC
  Administered 2019-07-03 – 2019-07-12 (×8): 6 via TOPICAL

## 2019-07-02 MED ORDER — FUROSEMIDE 40 MG PO TABS
40.0000 mg | ORAL_TABLET | Freq: Every day | ORAL | Status: DC
  Administered 2019-07-03 – 2019-07-10 (×8): 40 mg via ORAL
  Filled 2019-07-02 (×8): qty 1

## 2019-07-02 MED ORDER — POTASSIUM CHLORIDE CRYS ER 20 MEQ PO TBCR: 20.0000 meq | EXTENDED_RELEASE_TABLET | Freq: Once | ORAL | Status: DC

## 2019-07-02 MED ORDER — POTASSIUM CHLORIDE CRYS ER 20 MEQ PO TBCR
40.0000 meq | EXTENDED_RELEASE_TABLET | Freq: Once | ORAL | Status: AC
  Administered 2019-07-02: 40 meq via ORAL
  Filled 2019-07-02: qty 2

## 2019-07-02 MED ORDER — SODIUM CHLORIDE 0.9% IV SOLUTION: Freq: Once | INTRAVENOUS | Status: AC

## 2019-07-02 MED ORDER — MAGNESIUM SULFATE 2 GM/50ML IV SOLN
2.0000 g | Freq: Once | INTRAVENOUS | Status: AC
  Administered 2019-07-02: 2 g via INTRAVENOUS
  Filled 2019-07-02: qty 50

## 2019-07-02 MED ORDER — FUROSEMIDE 10 MG/ML IJ SOLN
40.0000 mg | Freq: Once | INTRAMUSCULAR | Status: AC
  Administered 2019-07-02: 40 mg via INTRAVENOUS
  Filled 2019-07-02: qty 4

## 2019-07-02 NOTE — Progress Notes (Signed)
ANTICOAGULATION CONSULT NOTE - Follow Up Consult  Pharmacy Consult for heparin Indication: atrial fibrillation  Allergies  Allergen Reactions  . Benadryl [Diphenhydramine] Shortness Of Breath  . Penicillins Diarrhea    Did it involve swelling of the face/tongue/throat, SOB, or low BP? No Did it involve sudden or severe rash/hives, skin peeling, or any reaction on the inside of your mouth or nose? No Did you need to seek medical attention at a hospital or doctor's office? No When did it last happen?2 months If all above answers are "NO", may proceed with cephalosporin use.  . Adhesive [Tape] Other (See Comments)    Burning of skin  . Amoxicillin Diarrhea and Other (See Comments)    Has patient had a PCN reaction causing immediate rash, facial/tongue/throat swelling, SOB or lightheadedness with hypotension: No Has patient had a PCN reaction causing severe rash involving mucus membranes or skin necrosis: No Has patient had a PCN reaction that required hospitalization No Has patient had a PCN reaction occurring within the last 10 years: Yes -- noted rxn as diarrhea If all of the above answers are "NO", then may proceed with Cephalosporin use.   . Cyclobenzaprine Other (See Comments)    Causes restless leg syndrome Restless syndrome  . Hydrocodone Itching    Patient Measurements: Height: 5\' 1"  (154.9 cm) Weight: 73 kg (160 lb 15 oz) IBW/kg (Calculated) : 47.8 Heparin Dosing Weight: 73 kg   Vital Signs: Temp: 98.1 F (36.7 C) (04/27 0524) Temp Source: Oral (04/27 0524) BP: 125/80 (04/27 0700) Pulse Rate: 65 (04/27 0700)  Labs: Recent Labs    06/30/19 0634 06/30/19 0634 07/01/19 0352 07/01/19 1429 07/02/19 0246  HGB 7.8*   < > 7.5*  --  6.3*  HCT 25.8*  --  25.2*  --  20.5*  PLT 154  --  175  --  202  HEPARINUNFRC  --   --   --  2.00* 0.31  CREATININE  --   --  0.73  --  0.94   < > = values in this interval not displayed.    Estimated Creatinine Clearance:  53.1 mL/min (by C-G formula based on SCr of 0.94 mg/dL).   Medical History: Past Medical History:  Diagnosis Date  . Anemia   . Anginal pain ([place])    jaw pain 07/2016, s/p 07/15/16 nuclear stress test  . Anxiety   . Arthritis    "lower back" (08/24/2017)  . Asthma   . CAD (coronary artery disease), native coronary artery    a. Hx CABG 1998, last PCI 2013 b.LHC 07/2013:  EF 55-60%, L-LAD prob atretic, no sig disease in LAD, S-OM1 ok with patent stent, S-dRCA ok => med Rx. c. LHC 01/2015 DES to SVG-RCA.  Marland Kitchen Chronic back pain   . Chronic leg pain   . Chronic lower back pain   . COPD (chronic obstructive pulmonary disease) ([place])   . Depression   . DVT (deep venous thrombosis) ([facility]) X 3   RLE  . Fall 08/2016  . GERD (gastroesophageal reflux disease)   . Glucose intolerance (impaired glucose tolerance)   . Headache    "a few/month" (08/24/2017)  . HLD (hyperlipidemia)   . Hypertension   . Myocardial infarction ([place]) 1999  . OSA on CPAP   . PAD (peripheral artery disease) ([place])   . PAF (paroxysmal atrial fibrillation) ([place])    a. 09/2013 post-op b. 01/2015  . Peripheral neuropathy   . Pneumonia   . Shortness of breath  when walking  . Wears glasses     Medications:  Scheduled:  . sodium chloride   Intravenous Once  . amiodarone  200 mg Oral BID  . aspirin  81 mg Oral Daily  . atorvastatin  80 mg Oral Daily  . buPROPion  150 mg Oral Daily  . Chlorhexidine Gluconate Cloth  6 each Topical Daily  . digoxin  0.125 mg Oral Daily  . ezetimibe  10 mg Oral Daily  . furosemide  40 mg Oral Daily  . insulin aspart  0-9 Units Subcutaneous Q4H  . LORazepam  0.5 mg Intravenous Once  . losartan  12.5 mg Oral Daily  . mupirocin ointment  1 application Nasal BID  . pantoprazole  40 mg Oral Daily  . pramipexole  0.25 mg Oral QHS  . sertraline  150 mg Oral QHS  . sodium chloride flush  3 mL Intravenous Q12H  . spironolactone  25 mg Oral Daily    Assessment: 28 yof s/p aortobifemoral  bypass and impella 5.5. 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   Okay per vascular to restart heparin infusion on 4/26. Hgb down to 6.3 - transfusing 2 units PRBC - no obvious source of bleeding.   Heparin level is therapeutic at 0.31  Goal of Therapy:  Heparin level 0.3-0.7 units/ml Monitor platelets by anticoagulation protocol: Yes   Plan:  Continue heparin infusion at 900 units/hr Monitor daily CBC, heparin level, and for s/sx of bleeding  Vertis Kelch, PharmD, [facility] PGY2 Cardiology Pharmacy Resident Phone [phone] 07/02/2019       7:58 AM  Please check [email or web address] for unit-specific pharmacist phone numbers

## 2019-07-02 NOTE — Progress Notes (Signed)
Per Aundra Dubin MD, SBP goal > 90.

## 2019-07-02 NOTE — Progress Notes (Signed)
Wilmer Floor, MD contacted due to increased pain and hypotension. Instructed to give 576ml bolus and given okay to give additional 22mcg fentanyl at 2 separate time if the 1st is inadequate. Stated he was okay with MAPs in upper 50s since this is where she had been throughout the day and mentation is still appropriate. EKG completed for pt stated "angina." Cardiology reviewed and stateds EKG appears as previous one from yesterday. Wilmer Floor, MD instructed not to transfer to [place] due to hypotension. 1 unit PRBC also given for hgb-6.3

## 2019-07-02 NOTE — Progress Notes (Signed)
Inpatient Diabetes Program Recommendations  AACE/ADA: New Consensus Statement on Inpatient Glycemic Control (2015)  Target Ranges:  Prepandial:   less than 140 mg/dL      Peak postprandial:   less than 180 mg/dL (1-2 hours)      Critically ill patients:  140 - 180 mg/dL   Lab Results  Component Value Date   GLUCAP 91 07/02/2019   HGBA1C 5.4 06/24/2019    Review of Glycemic Control Results for ASTREA, PENT (MRN [id number]) as of 07/02/2019 11:28  Ref. Range 07/01/2019 15:49 07/01/2019 19:52 07/02/2019 00:39 07/02/2019 04:38 07/02/2019 08:22  Glucose-Capillary Latest Ref Range: 70 - 99 mg/dL 67 (L) 100 (H) 98 97 91   Diabetes history: None noted Current orders for Inpatient glycemic control: Novolog sensitive q 4 hours Inpatient Diabetes Program Recommendations:    Please consider d/c of Novolog correction.  Does not appear to need.    Thanks  Adah Perl, RN, BC-ADM Inpatient Diabetes Coordinator Pager [phone] (8a-5p)

## 2019-07-02 NOTE — Progress Notes (Addendum)
  Progress Note    07/02/2019 7:49 AM 2 Days Post-Op  Subjective:  No complaints this morning.  Sensation in L hand has returned   Vitals:   07/02/19 0630 07/02/19 0700  BP: (!) 113/49 125/80  Pulse: 64 65  Resp: 14 18  Temp:    SpO2: 93% 92%   Physical Exam: Lungs:  Non labored Incisions:  R chest incision c/d/i; L arm incision c/d/i without hematoma; midline and groin incisions stable Extremities:  Symmetrical DP pulses; L radial, ulnar, and palmer arch by doppler Abdomen:  soft Neurologic: A&O  CBC    Component Value Date/Time   WBC 6.8 07/02/2019 0246   RBC 2.17 (L) 07/02/2019 0246   HGB 6.3 (LL) 07/02/2019 0246   HGB 13.4 06/04/2019 1036   HCT 20.5 (L) 07/02/2019 0246   HCT 41.7 06/04/2019 1036   PLT 202 07/02/2019 0246   PLT 205 06/04/2019 1036   MCV 94.5 07/02/2019 0246   MCV 86 06/04/2019 1036   MCH 29.0 07/02/2019 0246   MCHC 30.7 07/02/2019 0246   RDW 17.8 (H) 07/02/2019 0246   RDW 17.4 (H) 06/04/2019 1036   LYMPHSABS 1.1 07/02/2019 0246   MONOABS 0.4 07/02/2019 0246   EOSABS 0.2 07/02/2019 0246   BASOSABS 0.0 07/02/2019 0246    BMET    Component Value Date/Time   NA 141 07/02/2019 0246   NA 141 06/04/2019 1036   K 3.8 07/02/2019 0246   CL 101 07/02/2019 0246   CO2 29 07/02/2019 0246   GLUCOSE 96 07/02/2019 0246   BUN 9 07/02/2019 0246   BUN 14 06/04/2019 1036   CREATININE 0.94 07/02/2019 0246   CREATININE 0.96 08/13/2015 0939   CALCIUM 7.9 (L) 07/02/2019 0246   GFRNONAA >60 07/02/2019 0246   GFRAA >60 07/02/2019 0246    INR    Component Value Date/Time   INR 1.8 (H) 06/24/2019 1458     Intake/Output Summary (Last 24 hours) at 07/02/2019 0749 Last data filed at 07/02/2019 0600 Gross per 24 hour  Intake 225.3 ml  Output 1000 ml  Net -774.7 ml     Assessment/Plan:  69 y.o. female is s/p ABF bypass with post op cardiac evetn requiring impella; LHC with subsequent repair of L brachial artery  2 Days Post-Op   L hand well  perfused BLE well perfused with palpable DP pulses ABL anemia; no obvious source; transfusing 2u pRBCs Chest discomfort overnight; Cardiology obtained new EKG, no changes, no chest pain on exam this morning Re-evaluate for transfer to floor after transfusion  DVT prophylaxis: IV heparin  Dagoberto Ligas, PA-C [facility] [phone] 07/02/2019 7:49 AM   I have independently interviewed and examined patient and agree with PA assessment and plan above.   Lyann Hagstrom C. Donzetta Matters, MD [facility] [place] Office: [phone] Pager: [phone]

## 2019-07-02 NOTE — Progress Notes (Signed)
Inpatient Rehab Admissions:  Inpatient Rehab Consult received.  I met with pt at the bedside for rehabilitation assessment and to discuss goals and expectations of an inpatient rehab admission.  Discussed intensity of the program and participation expectations with pt verbalizing understanding and in agreement to participate. Pt appeared interested in CIR program and would like to pursue if her insurance approves. AC is awaiting call back from pt's aunt, Tessie Fass, to confirm DC support. If support confirmed, AC will begin insurance authorization process for possible admit.   Will continue to follow.   Raechel Ache, OTR/L  Rehab Admissions Coordinator  [phone] 07/02/2019 1:48 PM

## 2019-07-02 NOTE — Progress Notes (Signed)
Patient ID: Wendy [name], female   DOB: September 11, 1950, 69 y.o.   MRN: [id number]     Advanced Heart Failure Rounding Note  PCP-Cardiologist: [name], MD   Subjective:    - 4/13 aortobifemoral bypass - Impella 5.5 placement 4/19 for cardiogenic shock.  - Extubated 4/20 - LHC 4/22  (left brachial access): All grafts occluded (known), totally occluded RCA, OM1, OM3, 80% dLCx, 60% pLAD.  No change, no intervention.  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   Hgb down to 6.3 today.  No evidence for GI bleeding, has ecchymosis at left brachial area surgical site.   SBP 90s-100s.   Objective:   Weight Range: 73 kg Body mass index is 30.41 kg/m.   Vital Signs:   Temp:  [97.6 F (36.4 C)-98.3 F (36.8 C)] 97.6 F (36.4 C) (04/27 0835) Pulse Rate:  [59-83] 65 (04/27 0835) Resp:  [10-25] 14 (04/27 0835) BP: (79-126)/(34-83) 105/43 (04/27 0835) SpO2:  [90 %-100 %] 93 % (04/27 0835) Last BM Date: 07/01/19  Weight change: Filed Weights   06/26/19 0615 06/27/19 0500 06/28/19 0527  Weight: 78.3 kg 75.5 kg 73 kg    Intake/Output:   Intake/Output Summary (Last 24 hours) at 07/02/2019 0836 Last data filed at 07/02/2019 0835 Gross per 24 hour  Intake 540.3 ml  Output 1000 ml  Net -459.7 ml      Physical Exam   General: NAD Neck: JVP 8-9 cm, no thyromegaly or thyroid nodule.  Lungs: Clear to auscultation bilaterally with normal respiratory effort. CV: Nondisplaced PMI.  Heart regular S1/S2, no S3/S4, no murmur.  No peripheral edema.   Abdomen: Soft, nontender, no hepatosplenomegaly, no distention.  Skin: Intact without lesions or rashes.  Neurologic: Alert and oriented x 3.  Psych: Normal affect. Extremities: No clubbing or cyanosis. Ecchymosis left brachial surgical site.  HEENT: Normal.    Telemetry   NSR 60s   Labs      CBC Recent Labs    07/01/19 0352 07/02/19 0246  WBC 9.5 6.8  NEUTROABS 8.9* 5.0  HGB 7.5* 6.3*  HCT 25.2* 20.5*  MCV 95.1 94.5  PLT 175 [id number]   Basic Metabolic Panel Recent Labs    07/01/19 0352 07/02/19 0246  NA 141 141  K 4.3 3.8  CL 105 101  CO2 28 29  GLUCOSE 142* 96  BUN 9 9  CREATININE 0.73 0.94  CALCIUM 8.1* 7.9*  MG 1.3* 1.9   Liver Function Tests No results for input(s): AST, ALT, ALKPHOS, BILITOT, PROT, ALBUMIN in the last 72 hours. No results for input(s): LIPASE, AMYLASE in the last 72 hours. Cardiac Enzymes No results for input(s): CKTOTAL, CKMB, CKMBINDEX, TROPONINI in the last 72 hours.  BNP: BNP (last 3 results) Recent Labs    06/19/19 1239  BNP 366.7*    ProBNP (last 3 results) No results for input(s): PROBNP in the last 8760 hours.   D-Dimer No results for input(s): DDIMER in the last 72 hours. Hemoglobin A1C No results for input(s): HGBA1C in the last 72 hours. Fasting Lipid Panel No results for input(s): CHOL, HDL, LDLCALC, TRIG, CHOLHDL, LDLDIRECT in the last 72 hours. Thyroid Function Tests No results for input(s): TSH, T4TOTAL, T3FREE, THYROIDAB in the last 72 hours.  Invalid input(s): FREET3  Other results:   Imaging    No results found.   Medications:  Scheduled Medications:  sodium chloride   Intravenous Once   amiodarone  200 mg Oral BID   aspirin  81 mg Oral Daily   atorvastatin  80 mg Oral Daily   buPROPion  150 mg Oral Daily   Chlorhexidine Gluconate Cloth  6 each Topical Daily   digoxin  0.125 mg Oral Daily   ezetimibe  10 mg Oral Daily   furosemide  40 mg Intravenous Once   [START ON 07/03/2019] furosemide  40 mg Oral Daily   insulin aspart  0-9 Units Subcutaneous Q4H   LORazepam  0.5 mg Intravenous Once   losartan  12.5 mg Oral Daily   mupirocin ointment  1 application Nasal BID   pantoprazole  40 mg Oral Daily   potassium chloride  20 mEq Oral Once   potassium chloride  40 mEq  Oral Once   pramipexole  0.25 mg Oral QHS   sertraline  150 mg Oral QHS   sodium chloride flush  3 mL Intravenous Q12H   spironolactone  25 mg Oral Daily    Infusions:  sodium chloride Stopped (06/28/19 1336)   sodium chloride     heparin 900 Units/hr (07/02/19 0000)   lactated ringers Stopped (06/29/19 1736)   magnesium sulfate bolus IVPB      PRN Medications: sodium chloride, sodium chloride, acetaminophen **OR** acetaminophen, ALPRAZolam, alum & mag hydroxide-simeth, diphenhydrAMINE **OR** diphenhydrAMINE, docusate sodium, fentaNYL (SUBLIMAZE) injection, gabapentin, guaiFENesin-dextromethorphan, metoprolol tartrate, nitroGLYCERIN, ondansetron, oxyCODONE-acetaminophen, phenol, polyethylene glycol, sodium chloride flush   Assessment/Plan   1. Cardiogenic shock: Progressive worsening 4/19 culminating in PEA arrest.  Lactate 8.1.  She had episodes of hypotension and atrial fibrillation/RVR earlier in her post-op course as well.  Symptoms were dyspnea and anxiety no chest pain.  No STEMI on ECG but concern for possible coronary event based on history.  Initial troponin 1802 => 2957.  Initially on NE, epi then Impella 5.5 placed on 4/19.  Now MAP is stable and off pressors.  Lactate has normalized. Impella 5.5 out 4/23.  - Resolved.  2. Acute systolic CHF: Suspect ischemic cardiomyopathy.  Echo 06/24/19 EF 20-25%, diffuse hypokinesis, preserved RV function.  Pre-op echo 06/17/19 EF 60%.  HS-TnI 1802 => 2957, elevated but not markedly high.  [place] 4/22 showed unchanged coronary anatomy, suspect patient got ischemic with hypotension.  Impella 5.5 out 4/23.  - Off entresto 4/25 due hypotension .  - Continue digoxin, level 0.6 today.  - Continue spironolactone 25 mg daily.   - Continue losartan 12.5 daily.  - No BP room yet for Coreg.   - Will give Lasix 40 mg IV with blood today, resume po Lasix tomorrow.  3. AKI: Creatinine as high as 2.86, now normalized.    4. CAD: LHC in 3/21 showed  occluded SVG-RCA and SVG-OM and atretic LIMA-LAD.  The LAD was patent with 60% mid vessel stenosis, the RCA was occluded with collaterals, and OM1 and OM2 were occluded.  No intervention.  Surprisingly, EF reported as normal pre-op.  No chest pain but had dyspnea and anxiety today.  ECG did not show STEMI, showed fairly stable lateral TWIs.  HS-TnI to 2957. [place] 4/22 showed unchanged coronary anatomy, suspect she was ischemic with hypotension.  No interventional target.   - Continue statin, ASA for now. Will eventually stop ASA as she will need long-term oral anticoagulation (Eliquis) unless vascular wants an antiplatelet agent post-aortobifemoral bypass.  5. PAD: S/p aortobifemoral bypass on 4/13.  Surgical site is stable, she has had palpable  PT pulses.  6. Acute hypoxemic respiratory failure: Intubated with PEA arrest. Resolved, extubated.  Chest still sore from CPR.  - Resolved 7. Atrial fibrillation: Paroxysmal.  She has has been in and out of atrial fibrillation post-op.  Has had episodes of junctional rhythm also.  Maintaining NSR.  - Continue amio 200 mg twice a day. - On heparin gtt   - Eventually to Eliquis alone.  8. Thrombocytopenia: Post-Impella placement.  - Resolved.  9. Anemia, post-op: hgb 7.6 -> 7.8->7.5 -> 6.3. Ecchymosis left brachial site.  - Transfusing 2 units PRBCs today.  10. CP: ECG improved. Doubt ischemic.  Suspect related to CPR.  11. 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   Ok to move out of ICU. Eventually start eliquis. Per [facility] start heparin and will switch in a few days as long as she remains stable.   Length of Stay: [place], MD  07/02/2019, 8:36 AM  Advanced Heart Failure Team Pager [phone] (M-F; 7a - 4p)  Please contact [facility] for night-coverage after hours (4p -7a ) and weekends on [email or web address]

## 2019-07-03 DIAGNOSIS — I5043 Acute on chronic combined systolic (congestive) and diastolic (congestive) heart failure: Secondary | ICD-10-CM | POA: Diagnosis not present

## 2019-07-03 LAB — CBC WITH DIFFERENTIAL/PLATELET
Abs Immature Granulocytes: 0.08 10*3/uL — ABNORMAL HIGH (ref 0.00–0.07)
Basophils Absolute: 0 10*3/uL (ref 0.0–0.1)
Basophils Relative: 1 %
Eosinophils Absolute: 0.1 10*3/uL (ref 0.0–0.5)
Eosinophils Relative: 2 %
HCT: 31.4 % — ABNORMAL LOW (ref 36.0–46.0)
Hemoglobin: 10 g/dL — ABNORMAL LOW (ref 12.0–15.0)
Immature Granulocytes: 2 %
Lymphocytes Relative: 12 %
Lymphs Abs: 0.6 10*3/uL — ABNORMAL LOW (ref 0.7–4.0)
MCH: 29.4 pg (ref 26.0–34.0)
MCHC: 31.8 g/dL (ref 30.0–36.0)
MCV: 92.4 fL (ref 80.0–100.0)
Monocytes Absolute: 0.3 10*3/uL (ref 0.1–1.0)
Monocytes Relative: 6 %
Neutro Abs: 3.8 10*3/uL (ref 1.7–7.7)
Neutrophils Relative %: 77 %
Platelets: 188 10*3/uL (ref 150–400)
RBC: 3.4 MIL/uL — ABNORMAL LOW (ref 3.87–5.11)
RDW: 17.3 % — ABNORMAL HIGH (ref 11.5–15.5)
WBC: 4.9 10*3/uL (ref 4.0–10.5)
nRBC: 0.6 % — ABNORMAL HIGH (ref 0.0–0.2)

## 2019-07-03 LAB — BASIC METABOLIC PANEL
Anion gap: 13 (ref 5–15)
BUN: 9 mg/dL (ref 8–23)
CO2: 28 mmol/L (ref 22–32)
Calcium: 8.2 mg/dL — ABNORMAL LOW (ref 8.9–10.3)
Chloride: 100 mmol/L (ref 98–111)
Creatinine, Ser: 0.99 mg/dL (ref 0.44–1.00)
GFR calc Af Amer: >60 mL/min (ref >60)
GFR calc non Af Amer: 59 mL/min — ABNORMAL LOW (ref >60)
Glucose, Bld: 85 mg/dL (ref 70–99)
Potassium: 4 mmol/L (ref 3.5–5.1)
Sodium: 141 mmol/L (ref 135–145)

## 2019-07-03 LAB — HEPARIN LEVEL (UNFRACTIONATED)
Heparin Unfractionated: 0.28 IU/mL — ABNORMAL LOW (ref 0.30–0.70)
Heparin Unfractionated: 0.39 IU/mL (ref 0.30–0.70)

## 2019-07-03 LAB — GLUCOSE, CAPILLARY
Glucose-Capillary: 100 mg/dL — ABNORMAL HIGH (ref 70–99)
Glucose-Capillary: 115 mg/dL — ABNORMAL HIGH (ref 70–99)
Glucose-Capillary: 77 mg/dL (ref 70–99)
Glucose-Capillary: 78 mg/dL (ref 70–99)
Glucose-Capillary: 78 mg/dL (ref 70–99)
Glucose-Capillary: 91 mg/dL (ref 70–99)
Glucose-Capillary: 94 mg/dL (ref 70–99)

## 2019-07-03 LAB — MAGNESIUM: Magnesium: 1.9 mg/dL (ref 1.7–2.4)

## 2019-07-03 MED ORDER — LOSARTAN POTASSIUM 25 MG PO TABS
25.0000 mg | ORAL_TABLET | Freq: Every day | ORAL | Status: DC
  Administered 2019-07-03 – 2019-07-12 (×10): 25 mg via ORAL
  Filled 2019-07-03 (×9): qty 1

## 2019-07-03 MED ORDER — OXYCODONE HCL ER 10 MG PO T12A
10.0000 mg | EXTENDED_RELEASE_TABLET | Freq: Two times a day (BID) | ORAL | Status: DC
  Administered 2019-07-03 – 2019-07-11 (×17): 10 mg via ORAL
  Filled 2019-07-03 (×17): qty 1

## 2019-07-03 MED ORDER — ALPRAZOLAM 0.5 MG PO TABS
0.5000 mg | ORAL_TABLET | Freq: Three times a day (TID) | ORAL | Status: DC | PRN
  Administered 2019-07-03 – 2019-07-12 (×13): 0.5 mg via ORAL
  Filled 2019-07-03 (×13): qty 1

## 2019-07-03 NOTE — Care Management Important Message (Signed)
Important Message  Patient Details  Name: Wendy [name] MRN: [id number] Date of Birth: 04/01/1950   Medicare Important Message Given:  Yes     Shelda Altes 07/03/2019, 10:46 AM

## 2019-07-03 NOTE — Progress Notes (Signed)
Patient ID: Wendy [name], female   DOB: Sep 09, 1950, 69 y.o.   MRN: [id number]    Advanced Heart Failure Rounding Note  PCP-Cardiologist: [name], MD   Subjective:    - 4/13 aortobifemoral bypass - Impella 5.5 placement 4/19 for cardiogenic shock.  - Extubated 4/20 - LHC 4/22  (left brachial access): All grafts occluded (known), totally occluded RCA, OM1, OM3, 80% dLCx, 60% pLAD.  No change, no intervention.  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   Hgb up to 10 today.    SBP 100s-110s.   Objective:   Weight Range: 70.5 kg Body mass index is 29.37 kg/m.   Vital Signs:   Temp:  [95 F (35 C)-97.8 F (36.6 C)] 97.6 F (36.4 C) (04/28 0700) Pulse Rate:  [59-125] 62 (04/28 0800) Resp:  [11-18] 16 (04/28 0800) BP: (92-127)/(42-79) 115/44 (04/28 0800) SpO2:  [85 %-98 %] 94 % (04/28 0800) Weight:  [70.5 kg] 70.5 kg (04/28 0600) Last BM Date: 07/02/19  Weight change: Filed Weights   06/27/19 0500 06/28/19 0527 07/03/19 0600  Weight: 75.5 kg 73 kg 70.5 kg    Intake/Output:   Intake/Output Summary (Last 24 hours) at 07/03/2019 0848 Last data filed at 07/03/2019 0800 Gross per 24 hour  Intake 715.39 ml  Output 1850 ml  Net -1134.61 ml      Physical Exam   General: NAD Neck: JVP 8 cm, no thyromegaly or thyroid nodule.  Lungs: Clear to auscultation bilaterally with normal respiratory effort. CV: Nondisplaced PMI.  Heart regular S1/S2, no S3/S4, 2/6 SEM RUSB.  No peripheral edema.   Abdomen: Soft, nontender, no hepatosplenomegaly, no distention.  Skin: Intact without lesions or rashes.  Neurologic: Alert and oriented x 3.  Psych: Normal affect. Extremities: No clubbing or cyanosis.  HEENT: Normal.    Telemetry   NSR 60s   Labs    CBC Recent Labs    07/02/19 0246 07/02/19  0246 07/02/19 1858 07/03/19 0325  WBC 6.8   < > 6.2 4.9  NEUTROABS 5.0  --   --  3.8  HGB 6.3*   < > 10.1* 10.0*  HCT 20.5*   < > 31.4* 31.4*  MCV 94.5   < > 90.8 92.4  PLT 202   < > 193 188   < > = values in this interval not displayed.   Basic Metabolic Panel Recent Labs    07/02/19 0246 07/03/19 0325  NA 141 141  K 3.8 4.0  CL 101 100  CO2 29 28  GLUCOSE 96 85  BUN 9 9  CREATININE 0.94 0.99  CALCIUM 7.9* 8.2*  MG 1.9 1.9   Liver Function Tests No results for input(s): AST, ALT, ALKPHOS, BILITOT, PROT, ALBUMIN in the last 72 hours. No results for input(s): LIPASE, AMYLASE in the last 72 hours. Cardiac Enzymes No results for input(s): CKTOTAL, CKMB, CKMBINDEX, TROPONINI in the last 72 hours.  BNP: BNP (last 3 results) Recent Labs    06/19/19 1239  BNP 366.7*    ProBNP (last 3 results) No results for input(s): PROBNP in the last 8760 hours.   D-Dimer No results for input(s): DDIMER in the last 72 hours. Hemoglobin A1C No results for input(s): HGBA1C in the last 72 hours. Fasting Lipid Panel No results for input(s): CHOL, HDL, LDLCALC, TRIG, CHOLHDL,  LDLDIRECT in the last 72 hours. Thyroid Function Tests No results for input(s): TSH, T4TOTAL, T3FREE, THYROIDAB in the last 72 hours.  Invalid input(s): FREET3  Other results:   Imaging    No results found.   Medications:     Scheduled Medications: . sodium chloride   Intravenous Once  . amiodarone  200 mg Oral BID  . aspirin  81 mg Oral Daily  . atorvastatin  80 mg Oral Daily  . buPROPion  150 mg Oral Daily  . Chlorhexidine Gluconate Cloth  6 each Topical Daily  . digoxin  0.125 mg Oral Daily  . ezetimibe  10 mg Oral Daily  . furosemide  40 mg Oral Daily  . insulin aspart  0-9 Units Subcutaneous Q4H  . LORazepam  0.5 mg Intravenous Once  . losartan  25 mg Oral Daily  . pantoprazole  40 mg Oral Daily  . pramipexole  0.25 mg Oral QHS  . sertraline  150 mg Oral QHS  . sodium chloride flush   3 mL Intravenous Q12H  . spironolactone  25 mg Oral Daily    Infusions: . sodium chloride Stopped (06/28/19 1336)  . sodium chloride    . heparin 950 Units/hr (07/03/19 0837)  . lactated ringers Stopped (06/29/19 1736)    PRN Medications: sodium chloride, sodium chloride, acetaminophen **OR** acetaminophen, ALPRAZolam, alum & mag hydroxide-simeth, diphenhydrAMINE **OR** diphenhydrAMINE, docusate sodium, fentaNYL (SUBLIMAZE) injection, gabapentin, guaiFENesin-dextromethorphan, metoprolol tartrate, nitroGLYCERIN, ondansetron, oxyCODONE-acetaminophen, phenol, polyethylene glycol, sodium chloride flush   Assessment/Plan   1. Cardiogenic shock: Progressive worsening 4/19 culminating in PEA arrest.  Lactate 8.1.  She had episodes of hypotension and atrial fibrillation/RVR earlier in her post-op course as well.  Symptoms were dyspnea and anxiety no chest pain.  No STEMI on ECG but concern for possible coronary event based on history.  Initial troponin 1802 => 2957.  Initially on NE, epi then Impella 5.5 placed on 4/19.  Now MAP is stable and off pressors.  Lactate has normalized. Impella 5.5 out 4/23.  - Resolved.  2. Acute systolic CHF: Suspect ischemic cardiomyopathy.  Echo 06/24/19 EF 20-25%, diffuse hypokinesis, preserved RV function.  Pre-op echo 06/17/19 EF 60%.  HS-TnI 1802 => 2957, elevated but not markedly high.  [employer] 4/22 showed unchanged coronary anatomy, suspect patient got ischemic with hypotension.  Impella 5.5 out 4/23.  - Off entresto 4/25 due hypotension .  - Continue digoxin - Continue spironolactone 25 mg daily.   - Increase losartan to 25 mg daily.  - Will add Coreg 3.125 as next step if BP allows.   - Continue Lasix 40 mg daily.   3. AKI: Creatinine as high as 2.86, now normalized.    4. CAD: LHC in 3/21 showed occluded SVG-RCA and SVG-OM and atretic LIMA-LAD.  The LAD was patent with 60% mid vessel stenosis, the RCA was occluded with collaterals, and OM1 and OM2 were occluded.   No intervention.  Surprisingly, EF reported as normal pre-op.  No chest pain but had dyspnea and anxiety today.  ECG did not show STEMI, showed fairly stable lateral TWIs.  HS-TnI to 2957. [place] 4/22 showed unchanged coronary anatomy, suspect she was ischemic with hypotension.  No interventional target.   - Continue statin, ASA for now. Will eventually stop ASA as she will need long-term oral anticoagulation (Eliquis) unless vascular wants an antiplatelet agent post-aortobifemoral bypass.  5. PAD: S/p aortobifemoral bypass on 4/13.  Surgical site is stable, she has had palpable PT pulses.  6. Acute hypoxemic  respiratory failure: Intubated with PEA arrest. Resolved, extubated.  Chest still sore from CPR.  - Resolved 7. Atrial fibrillation: Paroxysmal.  She has has been in and out of atrial fibrillation post-op.  Has had episodes of junctional rhythm also.  Maintaining NSR.  - Continue amio 200 mg twice a day. - On heparin gtt => transition to Eliquis when ok per surgery.  8. Thrombocytopenia: Post-Impella placement.  - Resolved.  9. Anemia, post-op: hgb 7.6 -> 7.8->7.5 -> 6.3 -> transfused 2 units -> 10. Ecchymosis left brachial site.  10. CP: ECG improved. Doubt ischemic.  Suspect related to CPR.  11. 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   Ok to move out of ICU.   Length of Stay: [place], MD  07/03/2019, 8:48 AM  Advanced Heart Failure Team Pager [phone] (M-F; 7a - 4p)  Please contact [facility] for night-coverage after hours (4p -7a ) and weekends on [email or web address]

## 2019-07-03 NOTE — Progress Notes (Signed)
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 PMHx: L to R femorofemoral bypass. PMHx: COPD, CAD, HTN, past MI, PVDx, cardiac stents.    PT Comments    Patient progressing this session to ambulation to the hallway.  She fatigues quickly and needs encouragement.  Was able to stand for hand hygiene after toileting, but leans on elbows for balance and due to fatigue.  She had just been up to [facility] with nurse tech.  Feel she remains appropriate for CIR level rehab prior to d/c home.    Follow Up Recommendations  CIR     Equipment Recommendations  Other (comment)(TBA)    Recommendations for Other Services       Precautions / Restrictions Precautions Precautions: Fall Precaution Comments: watch O2 Restrictions Weight Bearing Restrictions: Yes LUE Weight Bearing: Weight bearing as tolerated    Mobility  Bed Mobility Overal bed mobility: Needs Assistance Bed Mobility: Rolling;Sidelying to Sit;Sit to Supine Rolling: Min assist Sidelying to sit: Min assist   Sit to supine: Mod assist;+2 for safety/equipment   General bed mobility comments: cues for technique to roll due to pain in chest, to supine assist for legs and positioning  Transfers Overall transfer level: Needs assistance Equipment used: Rolling walker (2 wheeled) Transfers: Sit to/from Stand Sit to Stand: Min assist;+2 safety/equipment         General transfer comment: stood from bed with +2 for safety, from toilet in bathroom heavy mod A +1 cues for using grabbar  Ambulation/Gait Ambulation/Gait assistance: Min assist;+2 safety/equipment Gait Distance (Feet): 25 Feet Assistive device: Rolling walker (2  wheeled) Gait Pattern/deviations: Step-through pattern;Decreased stride length;Wide base of support;Trunk flexed     General Gait Details: pushing walker out in front, assist for balance, proximity, +2 for lines/equipment, stopped in bathroom then washed hands at sink then back to bed per pt.   Stairs             Wheelchair Mobility    Modified Rankin (Stroke Patients Only)       Balance Overall balance assessment: Needs assistance   Sitting balance-Leahy Scale: Fair     Standing balance support: Bilateral upper extremity supported Standing balance-Leahy Scale: Poor Standing balance comment: leans on her elbows while washing hands at sink                            Cognition Arousal/Alertness: Awake/alert Behavior During Therapy: WFL for tasks assessed/performed Overall Cognitive Status: Within Functional Limits for tasks assessed                                 General Comments: Following commands; pt stating "I am too lethargic to get OOB right now."      Exercises General Exercises - Upper Extremity Shoulder Flexion: AROM;20 reps;Supine Shoulder ABduction: AROM;20 reps;Supine Elbow Flexion: AROM;20 reps;Supine;Both Elbow Extension: AROM;Both;20 reps;Supine Wrist Flexion: AROM;20 reps;Supine Wrist Extension: AROM;20 reps;Supine Digit Composite Flexion: AROM;20 reps;Supine Composite Extension: AROM;15 reps;Supine    General Comments General comments (skin integrity, edema, etc.): completed pericare in bathroom with min A      Pertinent Vitals/Pain Pain Assessment: Faces Faces  Pain Scale: Hurts little more Pain Location: holding her chest as she returned to supine Pain Descriptors / Indicators: Grimacing;Moaning Pain Intervention(s): Monitored during session;Repositioned    Home Living                      Prior Function            PT Goals (current goals can now be found in the care plan section) Acute Rehab PT  Goals Patient Stated Goal: to feel stronger Progress towards PT goals: Progressing toward goals    Frequency    Min 3X/week      PT Plan Current plan remains appropriate    Co-evaluation              AM-PAC PT "6 Clicks" Mobility   Outcome Measure  Help needed turning from your back to your side while in a flat bed without using bedrails?: A Little Help needed moving from lying on your back to sitting on the side of a flat bed without using bedrails?: A Little Help needed moving to and from a bed to a chair (including a wheelchair)?: A Lot Help needed standing up from a chair using your arms (e.g., wheelchair or bedside chair)?: A Lot Help needed to walk in hospital room?: A Little Help needed climbing 3-5 steps with a railing? : A Lot 6 Click Score: 15    End of Session   Activity Tolerance: Patient limited by fatigue Patient left: in bed;with call bell/phone within reach   PT Visit Diagnosis: Other abnormalities of gait and mobility (R26.89);Muscle weakness (generalized) (M62.81)     Time: [id number] PT Time Calculation (min) (ACUTE ONLY): 24 min  Charges:  $Gait Training: 8-22 mins $Therapeutic Activity: 8-22 mins                     Magda Kiel, [place] [facility] [phone] 07/03/2019    Reginia Naas 07/03/2019, 4:44 PM

## 2019-07-03 NOTE — Progress Notes (Signed)
ANTICOAGULATION CONSULT NOTE - Follow Up Consult  Pharmacy Consult for heparin Indication: atrial fibrillation  Allergies  Allergen Reactions  . Benadryl [Diphenhydramine] Shortness Of Breath  . Penicillins Diarrhea    Did it involve swelling of the face/tongue/throat, SOB, or low BP? No Did it involve sudden or severe rash/hives, skin peeling, or any reaction on the inside of your mouth or nose? No Did you need to seek medical attention at a hospital or doctor's office? No When did it last happen?2 months If all above answers are "NO", may proceed with cephalosporin use.  . Adhesive [Tape] Other (See Comments)    Burning of skin  . Amoxicillin Diarrhea and Other (See Comments)    Has patient had a PCN reaction causing immediate rash, facial/tongue/throat swelling, SOB or lightheadedness with hypotension: No Has patient had a PCN reaction causing severe rash involving mucus membranes or skin necrosis: No Has patient had a PCN reaction that required hospitalization No Has patient had a PCN reaction occurring within the last 10 years: Yes -- noted rxn as diarrhea If all of the above answers are "NO", then may proceed with Cephalosporin use.   . Cyclobenzaprine Other (See Comments)    Causes restless leg syndrome Restless syndrome  . Hydrocodone Itching    Patient Measurements: Height: 5\' 1"  (154.9 cm) Weight: 70.5 kg (155 lb 6.8 oz) IBW/kg (Calculated) : 47.8 Heparin Dosing Weight: 73 kg   Vital Signs: Temp: 97.8 F (36.6 C) (04/28 1603) Temp Source: Oral (04/28 1603) BP: 106/55 (04/28 1603) Pulse Rate: 65 (04/28 1603)  Labs: Recent Labs    07/01/19 0352 07/01/19 1429 07/02/19 0246 07/02/19 0246 07/02/19 1858 07/03/19 0325 07/03/19 1458  HGB 7.5*  --  6.3*   < > 10.1* 10.0*  --   HCT 25.2*  --  20.5*  --  31.4* 31.4*  --   PLT 175  --  202  --  193 188  --   HEPARINUNFRC  --    < > 0.31  --   --  0.28* 0.39  CREATININE 0.73  --  0.94  --   --  0.99  --    < > = values in this interval not displayed.    Estimated Creatinine Clearance: 49.5 mL/min (by C-G formula based on SCr of 0.99 mg/dL).   Medical History: Past Medical History:  Diagnosis Date  . Anemia   . Anginal pain ([place])    jaw pain 07/2016, s/p 07/15/16 nuclear stress test  . Anxiety   . Arthritis    "lower back" (08/24/2017)  . Asthma   . CAD (coronary artery disease), native coronary artery    a. Hx CABG 1998, last PCI 2013 b.LHC 07/2013:  EF 55-60%, L-LAD prob atretic, no sig disease in LAD, S-OM1 ok with patent stent, S-dRCA ok => med Rx. c. LHC 01/2015 DES to SVG-RCA.  Marland Kitchen Chronic back pain   . Chronic leg pain   . Chronic lower back pain   . COPD (chronic obstructive pulmonary disease) ([place])   . Depression   . DVT (deep venous thrombosis) ([facility]) X 3   RLE  . Fall 08/2016  . GERD (gastroesophageal reflux disease)   . Glucose intolerance (impaired glucose tolerance)   . Headache    "a few/month" (08/24/2017)  . HLD (hyperlipidemia)   . Hypertension   . Myocardial infarction ([place]) 1999  . OSA on CPAP   . PAD (peripheral artery disease) ([place])   . PAF (paroxysmal atrial fibrillation) (  [place])    a. 09/2013 post-op b. 01/2015  . Peripheral neuropathy   . Pneumonia   . Shortness of breath    when walking  . Wears glasses     Medications:  Scheduled:  . sodium chloride   Intravenous Once  . amiodarone  200 mg Oral BID  . aspirin  81 mg Oral Daily  . atorvastatin  80 mg Oral Daily  . buPROPion  150 mg Oral Daily  . Chlorhexidine Gluconate Cloth  6 each Topical Daily  . digoxin  0.125 mg Oral Daily  . ezetimibe  10 mg Oral Daily  . furosemide  40 mg Oral Daily  . insulin aspart  0-9 Units Subcutaneous Q4H  . LORazepam  0.5 mg Intravenous Once  . losartan  25 mg Oral Daily  . oxyCODONE  10 mg Oral Q12H  . pantoprazole  40 mg Oral Daily  . pramipexole  0.25 mg Oral QHS  . sertraline  150 mg Oral QHS  . sodium chloride flush  3 mL Intravenous Q12H  . spironolactone   25 mg Oral Daily    Assessment: 84 yof s/p aortobifemoral bypass and impella 5.5. 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   Okay per vascular to restart heparin infusion on 4/26. - Hgb up 10, [id number] - Heparin level is within goal at 0.39  Goal of Therapy:  Heparin level 0.3-0.7 units/ml Monitor platelets by anticoagulation protocol: Yes   Plan:  Continue heparin infusion at 950 units/hr Monitor daily CBC, heparin level, and for s/sx of bleeding F/u transition to Eliquis per VVS  Vertis Kelch, PharmD, [facility] PGY2 Cardiology Pharmacy Resident Phone [phone] 07/03/2019       4:04 PM  Please check [email or web address] for unit-specific pharmacist phone numbers

## 2019-07-03 NOTE — Progress Notes (Signed)
Patient arrived to 4E room 04 at this time. Telemetry applied and CCMD notified. V/s complete. CHG bath done. Patient oriented to room and how to call nurse with any needs. Will continue to monitor.

## 2019-07-03 NOTE — Progress Notes (Signed)
ANTICOAGULATION CONSULT NOTE - Follow Up Consult  Pharmacy Consult for heparin Indication: atrial fibrillation  Allergies  Allergen Reactions  . Benadryl [Diphenhydramine] Shortness Of Breath  . Penicillins Diarrhea    Did it involve swelling of the face/tongue/throat, SOB, or low BP? No Did it involve sudden or severe rash/hives, skin peeling, or any reaction on the inside of your mouth or nose? No Did you need to seek medical attention at a hospital or doctor's office? No When did it last happen?2 months If all above answers are "NO", may proceed with cephalosporin use.  . Adhesive [Tape] Other (See Comments)    Burning of skin  . Amoxicillin Diarrhea and Other (See Comments)    Has patient had a PCN reaction causing immediate rash, facial/tongue/throat swelling, SOB or lightheadedness with hypotension: No Has patient had a PCN reaction causing severe rash involving mucus membranes or skin necrosis: No Has patient had a PCN reaction that required hospitalization No Has patient had a PCN reaction occurring within the last 10 years: Yes -- noted rxn as diarrhea If all of the above answers are "NO", then may proceed with Cephalosporin use.   . Cyclobenzaprine Other (See Comments)    Causes restless leg syndrome Restless syndrome  . Hydrocodone Itching    Patient Measurements: Height: 5\' 1"  (154.9 cm) Weight: 70.5 kg (155 lb 6.8 oz) IBW/kg (Calculated) : 47.8 Heparin Dosing Weight: 73 kg   Vital Signs: Temp: 97.5 F (36.4 C) (04/28 0300) Temp Source: Oral (04/28 0300) BP: 119/42 (04/28 0700) Pulse Rate: 63 (04/28 0700)  Labs: Recent Labs    07/01/19 0352 07/01/19 0352 07/01/19 1429 07/02/19 0246 07/02/19 0246 07/02/19 1858 07/03/19 0325  HGB 7.5*   < >  --  6.3*   < > 10.1* 10.0*  HCT 25.2*   < >  --  20.5*  --  31.4* 31.4*  PLT 175   < >  --  202  --  193 188  HEPARINUNFRC  --   --  2.00* 0.31  --   --  0.28*  CREATININE 0.73  --   --  0.94  --   --  0.99    < > = values in this interval not displayed.    Estimated Creatinine Clearance: 49.5 mL/min (by C-G formula based on SCr of 0.99 mg/dL).   Medical History: Past Medical History:  Diagnosis Date  . Anemia   . Anginal pain ([place])    jaw pain 07/2016, s/p 07/15/16 nuclear stress test  . Anxiety   . Arthritis    "lower back" (08/24/2017)  . Asthma   . CAD (coronary artery disease), native coronary artery    a. Hx CABG 1998, last PCI 2013 b.LHC 07/2013:  EF 55-60%, L-LAD prob atretic, no sig disease in LAD, S-OM1 ok with patent stent, S-dRCA ok => med Rx. c. LHC 01/2015 DES to SVG-RCA.  Marland Kitchen Chronic back pain   . Chronic leg pain   . Chronic lower back pain   . COPD (chronic obstructive pulmonary disease) ([place])   . Depression   . DVT (deep venous thrombosis) ([facility]) X 3   RLE  . Fall 08/2016  . GERD (gastroesophageal reflux disease)   . Glucose intolerance (impaired glucose tolerance)   . Headache    "a few/month" (08/24/2017)  . HLD (hyperlipidemia)   . Hypertension   . Myocardial infarction ([place]) 1999  . OSA on CPAP   . PAD (peripheral artery disease) ([place])   . PAF (  paroxysmal atrial fibrillation) ([place])    a. 09/2013 post-op b. 01/2015  . Peripheral neuropathy   . Pneumonia   . Shortness of breath    when walking  . Wears glasses     Medications:  Scheduled:  . sodium chloride   Intravenous Once  . amiodarone  200 mg Oral BID  . aspirin  81 mg Oral Daily  . atorvastatin  80 mg Oral Daily  . buPROPion  150 mg Oral Daily  . Chlorhexidine Gluconate Cloth  6 each Topical Daily  . digoxin  0.125 mg Oral Daily  . ezetimibe  10 mg Oral Daily  . furosemide  40 mg Oral Daily  . insulin aspart  0-9 Units Subcutaneous Q4H  . LORazepam  0.5 mg Intravenous Once  . losartan  12.5 mg Oral Daily  . pantoprazole  40 mg Oral Daily  . pramipexole  0.25 mg Oral QHS  . sertraline  150 mg Oral QHS  . sodium chloride flush  3 mL Intravenous Q12H  . spironolactone  25 mg Oral Daily     Assessment: 44 yof s/p aortobifemoral bypass and impella 5.5. 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   Okay per vascular to restart heparin infusion on 4/26. - Hgb up 10, pltc188 - Heparin level is slightly below goal at 0.28  Goal of Therapy:  Heparin level 0.3-0.7 units/ml Monitor platelets by anticoagulation protocol: Yes   Plan:  Increase heparin infusion slightly to 950 units/hr Monitor daily CBC, heparin level, and for s/sx of bleeding F/u transition to [place], PharmD, [facility] PGY2 Cardiology Pharmacy Resident Phone [phone] 07/03/2019       7:59 AM  Please check [email or web address] for unit-specific pharmacist phone numbers

## 2019-07-03 NOTE — Progress Notes (Signed)
Occupational Therapy Treatment Patient Details Name: Wendy [name] MRN: [id number] DOB: 07-Feb-1951 Today's Date: 07/03/2019    History of present illness Pt is a 69yo female s/p aortobifemoral bypass on 4/13, PEA arrest on 4/19, Impella 5.5 placement 4/19, Extubated 4/20, 4/23 impella removed, 4/25 S/P Emergent Repair of Left Brachial Artery Pseudoaneurysm. PMHx: L to R femorofemoral bypass. PMHx: COPD, CAD, HTN, past MI, PVDx, cardiac stents.   OT comments  Pt fatigued and lethargic today. Pt limtited by self limiting behavior, decreased activity tolerance, decreased mobility and decreased ability to care for self. Pt performing LUE HEP (elbow, wrist and hand, FMC of hand and shoulder flex/ext AROM), handout in Wendy [name] folder. Pt would benefit from continued OT skilled services for ADL, mobility and energy conservation education. OT following acutely.   Follow Up Recommendations  CIR    Equipment Recommendations  3 in 1 bedside commode;Other (comment)(defer to next facility)    Recommendations for Other Services      Precautions / Restrictions Precautions Precautions: Fall Precaution Comments: watch O2 Restrictions Weight Bearing Restrictions: Yes LUE Weight Bearing: Weight bearing as tolerated       Mobility Bed Mobility Overal bed mobility: Needs Assistance             General bed mobility comments: deferred as pt too fatigued.  Transfers Overall transfer level: Needs assistance               General transfer comment: deferred    Balance                                           ADL either performed or assessed with clinical judgement   ADL Overall ADL's : Needs assistance/impaired                                       General ADL Comments: Pt more lethargic and fatigued today. Pt limtited by self limiting behavior, decreased activity tolerance, decreased mobility and decreased ability to care for self.      Vision   Vision Assessment?: No apparent visual deficits   Perception     Praxis      Cognition Arousal/Alertness: Lethargic Behavior During Therapy: WFL for tasks assessed/performed Overall Cognitive Status: No family/caregiver present to determine baseline cognitive functioning                                 General Comments: Following commands; pt stating "I am too lethargic to get OOB right now."        Exercises Exercises: General Upper Extremity General Exercises - Upper Extremity Shoulder Flexion: AROM;20 reps;Supine Shoulder ABduction: AROM;20 reps;Supine Elbow Flexion: AROM;20 reps;Supine;Both Elbow Extension: AROM;Both;20 reps;Supine Wrist Flexion: AROM;20 reps;Supine Wrist Extension: AROM;20 reps;Supine Digit Composite Flexion: AROM;20 reps;Supine Composite Extension: AROM;15 reps;Supine   Shoulder Instructions       General Comments Bed level exercises only. Pt stating "I'm too tired after walking to the door and back today."    Pertinent Vitals/ Pain       Pain Assessment: Faces Faces Pain Scale: Hurts little more Pain Location: L arm Pain Intervention(s): Monitored during session;Patient requesting pain meds-RN notified;RN gave pain meds during session  Home Living  Prior Functioning/Environment              Frequency  Min 2X/week        Progress Toward Goals  OT Goals(current goals can now be found in the care plan section)  Progress towards OT goals: Progressing toward goals  Acute Rehab OT Goals Patient Stated Goal: to feel stronger OT Goal Formulation: With patient Time For Goal Achievement: 07/15/19 Potential to Achieve Goals: Good ADL Goals Pt Will Perform Grooming: with min assist;standing Pt Will Perform Lower Body Bathing: with mod assist;sitting/lateral leans;sit to/from stand;with adaptive equipment Pt Will Perform Lower Body Dressing: with min  assist;with adaptive equipment;sitting/lateral leans;sit to/from stand Pt Will Transfer to Toilet: with min assist;ambulating;bedside commode Pt/caregiver will Perform Home Exercise Program: Increased ROM;Left upper extremity;With written HEP provided Additional ADL Goal #1: Pt will recall and apply 1-3 ECS strategies to BADL activity Additional ADL Goal #2: Pt will improve to minguardA for bed mobility as a precursor for OOB ADL tasks.  Plan Discharge plan remains appropriate    Co-evaluation                 AM-PAC OT "6 Clicks" Daily Activity     Outcome Measure   Help from another person eating meals?: A Lot Help from another person taking care of personal grooming?: A Lot Help from another person toileting, which includes using toliet, bedpan, or urinal?: A Lot Help from another person bathing (including washing, rinsing, drying)?: A Lot Help from another person to put on and taking off regular upper body clothing?: A Lot Help from another person to put on and taking off regular lower body clothing?: Total 6 Click Score: 11    End of Session Equipment Utilized During Treatment: Oxygen  OT Visit Diagnosis: Unsteadiness on feet (R26.81);Muscle weakness (generalized) (M62.81)   Activity Tolerance Patient tolerated treatment well;Patient limited by fatigue   Patient Left in bed;with call bell/phone within reach;with bed alarm set   Nurse Communication Mobility status        Time: [id number] OT Time Calculation (min): 19 min  Charges: OT General Charges $OT Visit: 1 Visit OT Treatments $Therapeutic Exercise: 8-22 mins  Jefferey Pica, OTR/L [place] Pager: [phone] Office: [phone]    [name] C 07/03/2019, 4:23 PM

## 2019-07-03 NOTE — Progress Notes (Signed)
Inpatient Rehabilitation-Admissions Coordinator   I have confirmed DC support. AC will begin insurance authorization process for possible admit.   Raechel Ache, OTR/L  Rehab Admissions Coordinator  [phone] 07/03/2019 8:04 AM

## 2019-07-03 NOTE — Progress Notes (Addendum)
  Progress Note    07/03/2019 8:02 AM 3 Days Post-Op  Subjective:  Feeling better overall; tolerated transfusion yesterday   Vitals:   07/03/19 0600 07/03/19 0700  BP:  (!) 119/42  Pulse: 64 63  Resp: 13 13  Temp:  97.6 F (36.4 C)  SpO2: (!) 87% (!) 85%   Physical Exam: Lungs:  Non labored Incisions:  Abd and groin incisions nearly healed; L arm incision c/d/i Extremities:  Palpable DP pulses; absent L radial pulse but easily palpable ulnar pulse Abdomen:  Soft, NT, ND Neurologic: A&O  CBC    Component Value Date/Time   WBC 4.9 07/03/2019 0325   RBC 3.40 (L) 07/03/2019 0325   HGB 10.0 (L) 07/03/2019 0325   HGB 13.4 06/04/2019 1036   HCT 31.4 (L) 07/03/2019 0325   HCT 41.7 06/04/2019 1036   PLT 188 07/03/2019 0325   PLT 205 06/04/2019 1036   MCV 92.4 07/03/2019 0325   MCV 86 06/04/2019 1036   MCH 29.4 07/03/2019 0325   MCHC 31.8 07/03/2019 0325   RDW 17.3 (H) 07/03/2019 0325   RDW 17.4 (H) 06/04/2019 1036   LYMPHSABS 0.6 (L) 07/03/2019 0325   MONOABS 0.3 07/03/2019 0325   EOSABS 0.1 07/03/2019 0325   BASOSABS 0.0 07/03/2019 0325    BMET    Component Value Date/Time   NA 141 07/03/2019 0325   NA 141 06/04/2019 1036   K 4.0 07/03/2019 0325   CL 100 07/03/2019 0325   CO2 28 07/03/2019 0325   GLUCOSE 85 07/03/2019 0325   BUN 9 07/03/2019 0325   BUN 14 06/04/2019 1036   CREATININE 0.99 07/03/2019 0325   CREATININE 0.96 08/13/2015 0939   CALCIUM 8.2 (L) 07/03/2019 0325   GFRNONAA 59 (L) 07/03/2019 0325   GFRAA >60 07/03/2019 0325    INR    Component Value Date/Time   INR 1.8 (H) 06/24/2019 1458     Intake/Output Summary (Last 24 hours) at 07/03/2019 0802 Last data filed at 07/03/2019 0641 Gross per 24 hour  Intake 850.41 ml  Output 1850 ml  Net -999.59 ml     Assessment/Plan:  69 y.o. female is s/p ABF bypass with post op cardiac evetn requiring impella; LHC with subsequent repair of L brachial artery 3 Days Post-Op   L hand perfused with  palpable ulnar pulse BLE well perfused Hgb responded appropriately with transfusion; continue to monitor Continue increasing mobility Transfer to 4E    Dagoberto Ligas, PA-C [facility] [phone] 07/03/2019 8:02 AM  I have independently interviewed and examined patient and agree with PA assessment and plan above.  Responded to transfusion.  Will change to 10 mg oxycodone as well as increase her Xanax to home dose.  Transfer to the floor and pending CIR eval.  Khaliel Morey C. Donzetta Matters, MD [facility] [place] Office: [phone] Pager: [phone]

## 2019-07-04 DIAGNOSIS — I5022 Chronic systolic (congestive) heart failure: Secondary | ICD-10-CM | POA: Diagnosis not present

## 2019-07-04 LAB — BASIC METABOLIC PANEL
Anion gap: 8 (ref 5–15)
BUN: 10 mg/dL (ref 8–23)
CO2: 31 mmol/L (ref 22–32)
Calcium: 8.5 mg/dL — ABNORMAL LOW (ref 8.9–10.3)
Chloride: 102 mmol/L (ref 98–111)
Creatinine, Ser: 0.99 mg/dL (ref 0.44–1.00)
GFR calc Af Amer: >60 mL/min (ref >60)
GFR calc non Af Amer: 59 mL/min — ABNORMAL LOW (ref >60)
Glucose, Bld: 89 mg/dL (ref 70–99)
Potassium: 3.9 mmol/L (ref 3.5–5.1)
Sodium: 141 mmol/L (ref 135–145)

## 2019-07-04 LAB — TYPE AND SCREEN
ABO/RH(D): A POS
Antibody Screen: NEGATIVE
Unit division: 0
Unit division: 0
Unit division: 0
Unit division: 0
Unit division: 0
Unit division: 0

## 2019-07-04 LAB — BPAM RBC
Blood Product Expiration Date: 202105212359
Blood Product Expiration Date: 202105242359
Blood Product Expiration Date: 202105242359
Blood Product Expiration Date: 202105242359
Blood Product Expiration Date: 202105242359
Blood Product Expiration Date: 202105262359
ISSUE DATE / TIME: 202104260931
ISSUE DATE / TIME: 202104270345
ISSUE DATE / TIME: 202104270445
ISSUE DATE / TIME: 202104270908
Unit Type and Rh: 5100
Unit Type and Rh: 5100
Unit Type and Rh: 6200
Unit Type and Rh: 6200
Unit Type and Rh: 6200
Unit Type and Rh: 6200

## 2019-07-04 LAB — CBC
HCT: 32.6 % — ABNORMAL LOW (ref 36.0–46.0)
Hemoglobin: 10.1 g/dL — ABNORMAL LOW (ref 12.0–15.0)
MCH: 29.2 pg (ref 26.0–34.0)
MCHC: 31 g/dL (ref 30.0–36.0)
MCV: 94.2 fL (ref 80.0–100.0)
Platelets: 188 10*3/uL (ref 150–400)
RBC: 3.46 MIL/uL — ABNORMAL LOW (ref 3.87–5.11)
RDW: 16.9 % — ABNORMAL HIGH (ref 11.5–15.5)
WBC: 3.9 10*3/uL — ABNORMAL LOW (ref 4.0–10.5)
nRBC: 0 % (ref 0.0–0.2)

## 2019-07-04 LAB — HEPARIN LEVEL (UNFRACTIONATED): Heparin Unfractionated: 0.51 IU/mL (ref 0.30–0.70)

## 2019-07-04 LAB — GLUCOSE, CAPILLARY
Glucose-Capillary: 82 mg/dL (ref 70–99)
Glucose-Capillary: 78 mg/dL (ref 70–99)
Glucose-Capillary: 75 mg/dL (ref 70–99)
Glucose-Capillary: 89 mg/dL (ref 70–99)

## 2019-07-04 LAB — MAGNESIUM: Magnesium: 1.7 mg/dL (ref 1.7–2.4)

## 2019-07-04 MED ORDER — CARVEDILOL 3.125 MG PO TABS
3.1250 mg | ORAL_TABLET | Freq: Two times a day (BID) | ORAL | Status: DC
  Administered 2019-07-04 – 2019-07-05 (×2): 3.125 mg via ORAL
  Filled 2019-07-04 (×2): qty 1

## 2019-07-04 NOTE — Progress Notes (Addendum)
  Progress Note    07/04/2019 7:28 AM 4 Days Post-Op  Subjective:  Somnolent this morning   Vitals:   07/03/19 2330 07/04/19 0444  BP: (!) 121/49 (!) 113/56  Pulse: 62 66  Resp: 15 13  Temp: 98.3 F (36.8 C) (!) 97.4 F (36.3 C)  SpO2: 93% 99%   Physical Exam Lungs:  Non labored Incisions:  R chest incision c/d/i; L arm incision healing well; groin incisions well healed Extremities:  Palpable DP pulses; palpable L ulnar pulse; palpable R radial pulse Abdomen:  Midline incision well healed Neurologic: A&O  CBC    Component Value Date/Time   WBC 3.9 (L) 07/04/2019 0209   RBC 3.46 (L) 07/04/2019 0209   HGB 10.1 (L) 07/04/2019 0209   HGB 13.4 06/04/2019 1036   HCT 32.6 (L) 07/04/2019 0209   HCT 41.7 06/04/2019 1036   PLT 188 07/04/2019 0209   PLT 205 06/04/2019 1036   MCV 94.2 07/04/2019 0209   MCV 86 06/04/2019 1036   MCH 29.2 07/04/2019 0209   MCHC 31.0 07/04/2019 0209   RDW 16.9 (H) 07/04/2019 0209   RDW 17.4 (H) 06/04/2019 1036   LYMPHSABS 0.6 (L) 07/03/2019 0325   MONOABS 0.3 07/03/2019 0325   EOSABS 0.1 07/03/2019 0325   BASOSABS 0.0 07/03/2019 0325    BMET    Component Value Date/Time   NA 141 07/04/2019 0209   NA 141 06/04/2019 1036   K 3.9 07/04/2019 0209   CL 102 07/04/2019 0209   CO2 31 07/04/2019 0209   GLUCOSE 89 07/04/2019 0209   BUN 10 07/04/2019 0209   BUN 14 06/04/2019 1036   CREATININE 0.99 07/04/2019 0209   CREATININE 0.96 08/13/2015 0939   CALCIUM 8.5 (L) 07/04/2019 0209   GFRNONAA 59 (L) 07/04/2019 0209   GFRAA >60 07/04/2019 0209    INR    Component Value Date/Time   INR 1.8 (H) 06/24/2019 1458     Intake/Output Summary (Last 24 hours) at 07/04/2019 [id number] Last data filed at 07/04/2019 0600 Gross per 24 hour  Intake 577.49 ml  Output 1850 ml  Net -1272.51 ml     Assessment/Plan:  69 y.o. female is s/p ABF bypass with post op cardiac evetn requiring impella; LHC with subsequent repair of L brachial artery 4 Days Post-Op     Extremities well perfused with peripheral pulses OOB ABL anemia: Hgb stable this morning Continue IV heparin: transition back to Xarelto when accepted to CIR    Dagoberto Ligas, PA-C [facility] [phone] 07/04/2019 7:28 AM  I have independently interviewed and examined patient and agree with PA assessment and plan above.   Shela Esses C. Donzetta Matters, MD [facility] [place] Office: [phone] Pager: [phone]  Addendum:  Patient was unfortunately denied for CIR.  I have had a "peer to peer" with former physician Amy Foster with [facility] and despite the patient's large midline abdominal incision and operations on all 4 extremities during this hospitalization, Mrs. Royce Macadamia does not believe that patient has physiatry needs for inpatient rehab and would benefit instead from lower level of care including SNF placement.  Our options moving forward will be appeal or placement in SNF.  This is very disappointing decision and I do not think that the insurance company has this patient's best interest in mind but we are left with no other options but to pursue the above.  Servando Snare, MD

## 2019-07-04 NOTE — Progress Notes (Addendum)
Patient ID: Wendy [name], female   DOB: [birth year], 69 y.o.   MRN: [id number] P    Advanced Heart Failure Rounding Note  PCP-Cardiologist: [name], MD   Subjective:    - 4/13 aortobifemoral bypass - Impella 5.5 placement 4/19 for cardiogenic shock.  - Extubated 4/20 - LHC 4/22  (left brachial access): All grafts occluded (known), totally occluded RCA, OM1, OM3, 80% dLCx, 60% pLAD.  No change, no intervention.  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  Chest wall still sore from CPR. No other complaints. BP 111/47, prior to AM meds.   Objective:   Weight Range: 70.5 kg Body mass index is 29.37 kg/m.   Vital Signs:   Temp:  [97.4 F (36.3 C)-98.5 F (36.9 C)] 97.4 F (36.3 C) (04/29 0444) Pulse Rate:  [59-68] 66 (04/29 0444) Resp:  [8-17] 13 (04/29 0444) BP: (106-144)/(44-57) 113/56 (04/29 0444) SpO2:  [91 %-100 %] 99 % (04/29 0444) Last BM Date: 07/02/19  Weight change: Filed Weights   06/27/19 0500 06/28/19 0527 07/03/19 0600  Weight: 75.5 kg 73 kg 70.5 kg    Intake/Output:   Intake/Output Summary (Last 24 hours) at 07/04/2019 0758 Last data filed at 07/04/2019 0600 Gross per 24 hour  Intake 577.49 ml  Output 1850 ml  Net -1272.51 ml      Physical Exam   PHYSICAL EXAM: General:  Well appearing. No respiratory difficulty HEENT: normal Neck: supple. no JVD. Carotids 2+ bilat; no bruits. No lymphadenopathy or thyromegaly appreciated. Cor: PMI nondisplaced. Regular rate & rhythm. No rubs, gallops or murmurs. Lungs: clear Abdomen: soft, nontender, nondistended. No hepatosplenomegaly. No bruits or masses. Good bowel sounds. Extremities: no cyanosis, clubbing, rash, edema Neuro: alert & oriented x 3, cranial nerves grossly intact. moves all 4 extremities w/o difficulty. Affect pleasant.   Telemetry   NSR 60-70ss   Labs    CBC Recent Labs   07/02/19 0246 07/02/19 1858 07/03/19 0325 07/04/19 0209  WBC 6.8   < > 4.9 3.9*  NEUTROABS 5.0  --  3.8  --   HGB 6.3*   < > 10.0* 10.1*  HCT 20.5*   < > 31.4* 32.6*  MCV 94.5   < > 92.4 94.2  PLT 202   < > 188 188   < > = values in this interval not displayed.   Basic Metabolic Panel Recent Labs    07/03/19 0325 07/04/19 0209  NA 141 141  K 4.0 3.9  CL 100 102  CO2 28 31  GLUCOSE 85 89  BUN 9 10  CREATININE 0.99 0.99  CALCIUM 8.2* 8.5*  MG 1.9 1.7   Liver Function Tests No results for input(s): AST, ALT, ALKPHOS, BILITOT, PROT, ALBUMIN in the last 72 hours. No results for input(s): LIPASE, AMYLASE in the last 72 hours. Cardiac Enzymes No results for input(s): CKTOTAL, CKMB, CKMBINDEX, TROPONINI in the last 72 hours.  BNP: BNP (last 3 results) Recent Labs    06/19/19 1239  BNP 366.7*    ProBNP (last 3 results) No results for input(s): PROBNP in the last 8760 hours.   D-Dimer No results for input(s): DDIMER in the last 72 hours. Hemoglobin A1C No results for input(s): HGBA1C in the last 72 hours. Fasting Lipid Panel No results for input(s): CHOL, HDL, LDLCALC, TRIG, CHOLHDL, LDLDIRECT in the last 72 hours. Thyroid Function Tests No results for input(s):  TSH, T4TOTAL, T3FREE, THYROIDAB in the last 72 hours.  Invalid input(s): FREET3  Other results:   Imaging    No results found.   Medications:     Scheduled Medications: . amiodarone  200 mg Oral BID  . aspirin  81 mg Oral Daily  . atorvastatin  80 mg Oral Daily  . buPROPion  150 mg Oral Daily  . Chlorhexidine Gluconate Cloth  6 each Topical Daily  . digoxin  0.125 mg Oral Daily  . ezetimibe  10 mg Oral Daily  . furosemide  40 mg Oral Daily  . insulin aspart  0-9 Units Subcutaneous Q4H  . LORazepam  0.5 mg Intravenous Once  . losartan  25 mg Oral Daily  . oxyCODONE  10 mg Oral Q12H  . pantoprazole  40 mg Oral Daily  . pramipexole  0.25 mg Oral QHS  . sertraline  150 mg Oral QHS  .  sodium chloride flush  3 mL Intravenous Q12H  . spironolactone  25 mg Oral Daily    Infusions: . sodium chloride    . heparin 950 Units/hr (07/04/19 0600)    PRN Medications: sodium chloride, acetaminophen **OR** acetaminophen, ALPRAZolam, alum & mag hydroxide-simeth, diphenhydrAMINE **OR** diphenhydrAMINE, docusate sodium, fentaNYL (SUBLIMAZE) injection, gabapentin, guaiFENesin-dextromethorphan, metoprolol tartrate, nitroGLYCERIN, ondansetron, phenol, polyethylene glycol, sodium chloride flush   Assessment/Plan   1. Cardiogenic shock: Progressive worsening 4/19 culminating in PEA arrest.  Lactate 8.1.  She had episodes of hypotension and atrial fibrillation/RVR earlier in her post-op course as well.  Symptoms were dyspnea and anxiety no chest pain.  No STEMI on ECG but concern for possible coronary event based on history.  Initial troponin 1802 => 2957.  Initially on NE, epi then Impella 5.5 placed on 4/19.  Now MAP is stable and off pressors.  Lactate has normalized. Impella 5.5 out 4/23.  - Resolved.  2. Acute systolic CHF: Suspect ischemic cardiomyopathy.  Echo 06/24/19 EF 20-25%, diffuse hypokinesis, preserved RV function.  Pre-op echo 06/17/19 EF 60%.  HS-TnI 1802 => 2957, elevated but not markedly high.  [place] 4/22 showed unchanged coronary anatomy, suspect patient got ischemic with hypotension.  Impella 5.5 out 4/23.  - Off entresto 4/25 due hypotension.  - Tolerating Losartan, continue 25 mg daily - Continue digoxin - Continue spironolactone 25 mg daily  - Will add Coreg 3.125 as next step if BP allows.   - Continue Lasix 40 mg daily.   3. AKI: Creatinine as high as 2.86, now normalized.    4. CAD: LHC in 3/21 showed occluded SVG-RCA and SVG-OM and atretic LIMA-LAD.  The LAD was patent with 60% mid vessel stenosis, the RCA was occluded with collaterals, and OM1 and OM2 were occluded.  No intervention.  Surprisingly, EF reported as normal pre-op.  No chest pain but had dyspnea and  anxiety today.  ECG did not show STEMI, showed fairly stable lateral TWIs.  HS-TnI to 2957. [place] 4/22 showed unchanged coronary anatomy, suspect she was ischemic with hypotension.  No interventional target.   - Continue statin, ASA for now. Will eventually stop ASA as she will need long-term oral anticoagulation (Eliquis) unless vascular wants an antiplatelet agent post-aortobifemoral bypass.  5. PAD: S/p aortobifemoral bypass on 4/13.  Surgical site is stable, she has had palpable PT pulses.  6. Acute hypoxemic respiratory failure: Intubated with PEA arrest. Resolved, extubated.  Chest still sore from CPR.  - Resolved 7. Atrial fibrillation: Paroxysmal.  She has has been in and out of atrial fibrillation post-op.  Has had episodes of junctional rhythm also.  Maintaining NSR.  - Continue amio 200 mg twice a day. - On heparin gtt => transition to Eliquis when ok per surgery.  8. Thrombocytopenia: Post-Impella placement.  - Resolved.  9. Anemia, post-op: hgb 7.6 -> 7.8->7.5 -> 6.3 -> transfused 2 units -> 10. Ecchymosis left brachial site.  10. CP: ECG improved. Doubt ischemic.  Suspect related to CPR.  11. Deconditioning - Continue PT/OT - Plan CIR once cleared by VVS   12. 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    Length of Stay: [address] PA-C  07/04/2019, 7:58 AM  Advanced Heart Failure Team Pager [phone] (M-F; 7a - 4p)  Please contact [facility] for night-coverage after hours (4p -7a ) and weekends on [email or web address]  Patient seen with PA, agree with the above note.   Chest still sore post-CPR. Otherwise, no complaints.   I will start her on Coreg 3.125 mg bid today.   She is on heparin gtt, restart Eliquis when ok with surgery.   To rehab soon.   Loralie Champagne 07/04/2019 1:44 PM

## 2019-07-04 NOTE — Progress Notes (Signed)
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 PMHx: L to R femorofemoral bypass. PMHx: COPD, CAD, HTN, past MI, PVDx, cardiac stents.    PT Comments    Patient progressing with gait endurance and safety this session.  Able to walk in hallway with O2 donned @ 1LPM and SpO2 99% without rushing or c/o fatigue.  Was fatigued after toileting with BM in bathroom, but did not complain when we had her sit up in the chair at end of session.  Feel she is progressing and would tolerate CIR level rehab prior to d/c home.    Follow Up Recommendations  CIR     Equipment Recommendations  Other (comment)(to be assessed)    Recommendations for Other Services       Precautions / Restrictions Precautions Precautions: Fall Precaution Comments: watch O2    Mobility  Bed Mobility Overal bed mobility: Needs Assistance Bed Mobility: Supine to Sit     Supine to sit: Min assist     General bed mobility comments: due to chest pain  Transfers Overall transfer level: Needs assistance Equipment used: Rolling walker (2 wheeled) Transfers: Sit to/from Stand Sit to Stand: Mod assist         General transfer comment: lifting help from EOB and toilet in bathroom  Ambulation/Gait Ambulation/Gait assistance: Min assist Gait Distance (Feet): 120 Feet(& 20') Assistive device: Rolling walker (2 wheeled) Gait Pattern/deviations: Step-through pattern;Decreased stride length     General Gait Details: staying close to walker without cues this session, needing min A for safety/balance   Stairs             Wheelchair Mobility    Modified Rankin (Stroke Patients Only)       Balance  Overall balance assessment: Needs assistance   Sitting balance-Leahy Scale: Good Sitting balance - Comments: put socks on seated at EOB with crossed leg technique   Standing balance support: Bilateral upper extremity supported Standing balance-Leahy Scale: Poor Standing balance comment: leans on her elbows while washing hands at sink                            Cognition Arousal/Alertness: Awake/alert Behavior During Therapy: WFL for tasks assessed/performed Overall Cognitive Status: Within Functional Limits for tasks assessed                                        Exercises      General Comments General comments (skin integrity, edema, etc.): min A for hygiene after toileting      Pertinent Vitals/Pain Pain Assessment: Faces Faces Pain Scale: Hurts even more Pain Location: chest Pain Descriptors / Indicators: Grimacing;Guarding Pain Intervention(s): Monitored during session;Patient requesting pain meds-RN notified    Home Living                      Prior Function            PT Goals (current goals can now be found in the care plan section) Progress towards PT goals: Progressing toward goals    Frequency    Min  3X/week      PT Plan Current plan remains appropriate    Co-evaluation              AM-PAC PT "6 Clicks" Mobility   Outcome Measure  Help needed turning from your back to your side while in a flat bed without using bedrails?: None Help needed moving from lying on your back to sitting on the side of a flat bed without using bedrails?: A Little Help needed moving to and from a bed to a chair (including a wheelchair)?: A Little Help needed standing up from a chair using your arms (e.g., wheelchair or bedside chair)?: A Lot Help needed to walk in hospital room?: A Little Help needed climbing 3-5 steps with a railing? : A Lot 6 Click Score: 17    End of Session Equipment Utilized During Treatment:  Oxygen Activity Tolerance: Patient tolerated treatment well Patient left: in chair;with call bell/phone within reach Nurse Communication: Patient requests pain meds PT Visit Diagnosis: Other abnormalities of gait and mobility (R26.89);Muscle weakness (generalized) (M62.81)     Time: [id number] PT Time Calculation (min) (ACUTE ONLY): 37 min  Charges:  $Gait Training: 8-22 mins $Therapeutic Activity: 8-22 mins                     Magda Kiel, [place] [facility] [phone] 07/04/2019    Reginia Naas 07/04/2019, 5:13 PM

## 2019-07-04 NOTE — Progress Notes (Signed)
Inpatient Rehabilitation-Admissions Coordinator   Pt's insurance company requesting peer to peer prior to making a final determination. Dr. Donzetta Matters has agreed to completed peer to peer. I have arranged for the conference to occur today. Will follow up once there has been a final determination.   Please call if questions.   Raechel Ache, OTR/L  Rehab Admissions Coordinator  [phone] 07/04/2019 1:30 PM

## 2019-07-04 NOTE — Progress Notes (Signed)
@  0615 pt adamantly refused ambulation and transfer to chair citing being "too tired". Pt educated on necessity of ambulation but remained steadfast in her desire to remain in bed.

## 2019-07-04 NOTE — TOC Initial Note (Signed)
Transition of Care [facility]) - Initial/Assessment Note    Patient Details  Name: Wendy [name] MRN: [id number] Date of Birth: Jul 06, 1950  Transition of Care [facility] CM/SW Contact:    Vinie Sill, [place] Phone Number: 07/04/2019, 4:51 PM  Clinical Narrative:                  CSW visit with the patient at bedside. CSW introduced self and explained role. CSW discussed ST rehab at SNF as back up plan to CIR. Patient declined SNF. Patient states she has 24/7 support at home. Patient states she lives with her aunt and two nephews. Patient states her preference is to go home with [facility] and she has no HH preference.   RNCM updated.   Thurmond Butts, MSW, [place] Clinical Social Worker   Expected Discharge Plan: [place] Barriers to Discharge: Continued Medical Work up   Patient Goals and CMS Choice        Expected Discharge Plan and Services Expected Discharge Plan: [place] In-house Referral: Clinical Social Work                                            Prior Living Arrangements/Services       Do you feel safe going back to the place where you live?: Yes      Need for Family Participation in Patient Care: Yes (Comment) Care giver support system in place?: Yes (comment)   Criminal Activity/Legal Involvement Pertinent to Current Situation/Hospitalization: No - Comment as needed  Activities of Daily Living Home Assistive Devices/Equipment: Environmental consultant (specify type), Eyeglasses, Dentures (specify type), CPAP, Blood pressure cuff, Scales ADL Screening (condition at time of admission) Patient's cognitive ability adequate to safely complete daily activities?: Yes Is the patient deaf or have difficulty hearing?: No Does the patient have difficulty seeing, even when wearing glasses/contacts?: No Does the patient have difficulty concentrating, remembering, or making decisions?: No Patient able to express need for assistance with ADLs?:  Yes Does the patient have difficulty dressing or bathing?: No Independently performs ADLs?: Yes (appropriate for developmental age) Does the patient have difficulty walking or climbing stairs?: Yes Weakness of Legs: Both Weakness of Arms/Hands: None  Permission Sought/Granted                  Emotional Assessment Appearance:: Appears stated age Attitude/Demeanor/Rapport: Engaged Affect (typically observed): Calm, Appropriate Orientation: : Oriented to Self, Oriented to Place, Oriented to  Time, Oriented to Situation Alcohol / Substance Use: Not Applicable Psych Involvement: No (comment)  Admission diagnosis:  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 Cardiogenic shock ([place])   . 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 Coronary artery disease of native artery of native heart with stable angina pectoris ([place])   . 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 Acute combined systolic and diastolic heart failure ([place]) 07/26/2016  . Troponin level elevated 07/26/2016  . Chronic anticoagulation 07/22/2016  . CAD S/P multiple PCIs-last one 2016 07/22/2016  . NSTEMI (non-ST elevated myocardial infarction) ([place]) 07/22/2016  . Primary osteoarthritis of right hip 06/27/2016  . Avascular necrosis of hip, right ([place]) 06/27/2016  . PVD (peripheral vascular disease) ([place])   . Essential (primary) hypertension 01/14/2015  . Coronary artery disease involving native coronary artery of native heart  without angina pectoris 10/06/2014  . Acute angina ([place]) 05/12/2014  . Epigastric pain   . Chest pain with high risk of acute coronary syndrome 10/21/2013  . Acute respiratory failure ([place]) 09/20/2013  . Nonspecific abnormal results of liver function study 09/17/2013  . Cholecystitis, acute 09/15/2013  . PAD (peripheral artery disease) ([place])   . COPD (chronic obstructive pulmonary disease) ([place])   . Peripheral  neuropathy   . Chronic back pain   . Sleep apnea   . Anxiety   . GERD (gastroesophageal reflux disease)   . 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 Hypertension 07/20/2013  . HLD (hyperlipidemia) 07/20/2013  . Unstable angina ([place]) 07/19/2013  . Hx of CABG 07/19/2013   PCP:  Josetta Huddle, MD Pharmacy:   [facility] - [place], [place] - [address] [place] [address] Phone: [phone] Fax: [phone]     Social Determinants of Health (SDOH) Interventions    Readmission Risk Interventions No flowsheet data found.

## 2019-07-04 NOTE — Progress Notes (Signed)
ANTICOAGULATION CONSULT NOTE - Follow Up Consult  Pharmacy Consult for heparin Indication: atrial fibrillation  Allergies  Allergen Reactions  . Benadryl [Diphenhydramine] Shortness Of Breath  . Penicillins Diarrhea    Did it involve swelling of the face/tongue/throat, SOB, or low BP? No Did it involve sudden or severe rash/hives, skin peeling, or any reaction on the inside of your mouth or nose? No Did you need to seek medical attention at a hospital or doctor's office? No When did it last happen?2 months If all above answers are "NO", may proceed with cephalosporin use.  . Adhesive [Tape] Other (See Comments)    Burning of skin  . Amoxicillin Diarrhea and Other (See Comments)    Has patient had a PCN reaction causing immediate rash, facial/tongue/throat swelling, SOB or lightheadedness with hypotension: No Has patient had a PCN reaction causing severe rash involving mucus membranes or skin necrosis: No Has patient had a PCN reaction that required hospitalization No Has patient had a PCN reaction occurring within the last 10 years: Yes -- noted rxn as diarrhea If all of the above answers are "NO", then may proceed with Cephalosporin use.   . Cyclobenzaprine Other (See Comments)    Causes restless leg syndrome Restless syndrome  . Hydrocodone Itching    Patient Measurements: Height: 5\' 1"  (154.9 cm) Weight: 70.5 kg (155 lb 6.8 oz) IBW/kg (Calculated) : 47.8 Heparin Dosing Weight: 73 kg   Vital Signs: Temp: 97.8 F (36.6 C) (04/29 1147) Temp Source: Oral (04/29 1147) BP: 111/53 (04/29 1147) Pulse Rate: 67 (04/29 1147)  Labs: Recent Labs    07/02/19 0246 07/02/19 0246 07/02/19 1858 07/03/19 0325 07/03/19 1458 07/04/19 0209  HGB 6.3*   < > 10.1* 10.0*  --  10.1*  HCT 20.5*   < > 31.4* 31.4*  --  32.6*  PLT 202   < > 193 188  --  188  HEPARINUNFRC 0.31   < >  --  0.28* 0.39 0.51  CREATININE 0.94  --   --  0.99  --  0.99   < > = values in this interval not  displayed.    Estimated Creatinine Clearance: 49.5 mL/min (by C-G formula based on SCr of 0.99 mg/dL).   Medical History: Past Medical History:  Diagnosis Date  . Anemia   . Anginal pain ([place])    jaw pain 07/2016, s/p 07/15/16 nuclear stress test  . Anxiety   . Arthritis    "lower back" (08/24/2017)  . Asthma   . CAD (coronary artery disease), native coronary artery    a. Hx CABG 1998, last PCI 2013 b.LHC 07/2013:  EF 55-60%, L-LAD prob atretic, no sig disease in LAD, S-OM1 ok with patent stent, S-dRCA ok => med Rx. c. LHC 01/2015 DES to SVG-RCA.  Marland Kitchen Chronic back pain   . Chronic leg pain   . Chronic lower back pain   . COPD (chronic obstructive pulmonary disease) ([place])   . Depression   . DVT (deep venous thrombosis) (H[facility]) X 3   RLE  . Fall 08/2016  . GERD (gastroesophageal reflux disease)   . Glucose intolerance (impaired glucose tolerance)   . Headache    "a few/month" (08/24/2017)  . HLD (hyperlipidemia)   . Hypertension   . Myocardial infarction ([place]) 1999  . OSA on CPAP   . PAD (peripheral artery disease) ([place])   . PAF (paroxysmal atrial fibrillation) ([place])    a. 09/2013 post-op b. 01/2015  . Peripheral neuropathy   .  Pneumonia   . Shortness of breath    when walking  . Wears glasses     Medications:  Scheduled:  . amiodarone  200 mg Oral BID  . aspirin  81 mg Oral Daily  . atorvastatin  80 mg Oral Daily  . buPROPion  150 mg Oral Daily  . carvedilol  3.125 mg Oral BID WC  . Chlorhexidine Gluconate Cloth  6 each Topical Daily  . digoxin  0.125 mg Oral Daily  . ezetimibe  10 mg Oral Daily  . furosemide  40 mg Oral Daily  . insulin aspart  0-9 Units Subcutaneous Q4H  . LORazepam  0.5 mg Intravenous Once  . losartan  25 mg Oral Daily  . oxyCODONE  10 mg Oral Q12H  . pantoprazole  40 mg Oral Daily  . pramipexole  0.25 mg Oral QHS  . sertraline  150 mg Oral QHS  . sodium chloride flush  3 mL Intravenous Q12H  . spironolactone  25 mg Oral Daily     Assessment: 6 yof s/p aortobifemoral bypass and impella 5.5. 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   Okay per vascular to restart heparin infusion on 4/26 - will use lower goal - Hgb up 10, [id number] no bleeding noted - Heparin level is within goal at 0.5 but starting to climb on heparin drip rate 950 uts/hr   Goal of Therapy:  Heparin level 0.3-0.5 Monitor platelets by anticoagulation protocol: Yes   Plan:  Decrease  heparin infusion at 900 units/hr Monitor daily CBC, heparin level, and for s/sx of bleeding F/u transition to Eliquis per VVS when patient transfer to rehab  [place].D. CPP, BCPS Clinical Pharmacist [phone] 07/04/2019 3:40 PM    Please check [email or web address] for unit-specific pharmacist phone numbers

## 2019-07-04 NOTE — Progress Notes (Signed)
Inpatient Rehabilitation-Admissions Coordinator   Was notified by pt's insurance company that her request for CIR, despite completion of peer to peer conference, has been denied. AC notified pt and TOC team. Pt notified of recommendation for SNF as alternative, with pt declining, stating she prefers home with home health. AC will sign off at this time.   Please call if questions.   Raechel Ache, OTR/L  Rehab Admissions Coordinator  [phone] 07/04/2019 5:35 PM

## 2019-07-05 DIAGNOSIS — I5022 Chronic systolic (congestive) heart failure: Secondary | ICD-10-CM | POA: Diagnosis not present

## 2019-07-05 DIAGNOSIS — I10 Essential (primary) hypertension: Secondary | ICD-10-CM | POA: Diagnosis not present

## 2019-07-05 LAB — CBC
HCT: 32.7 % — ABNORMAL LOW (ref 36.0–46.0)
Hemoglobin: 10.2 g/dL — ABNORMAL LOW (ref 12.0–15.0)
MCH: 29.1 pg (ref 26.0–34.0)
MCHC: 31.2 g/dL (ref 30.0–36.0)
MCV: 93.4 fL (ref 80.0–100.0)
Platelets: 220 10*3/uL (ref 150–400)
RBC: 3.5 MIL/uL — ABNORMAL LOW (ref 3.87–5.11)
RDW: 16.8 % — ABNORMAL HIGH (ref 11.5–15.5)
WBC: 5 10*3/uL (ref 4.0–10.5)
nRBC: 0 % (ref 0.0–0.2)

## 2019-07-05 LAB — BASIC METABOLIC PANEL
Anion gap: 12 (ref 5–15)
BUN: 11 mg/dL (ref 8–23)
CO2: 30 mmol/L (ref 22–32)
Calcium: 8.4 mg/dL — ABNORMAL LOW (ref 8.9–10.3)
Chloride: 98 mmol/L (ref 98–111)
Creatinine, Ser: 0.96 mg/dL (ref 0.44–1.00)
GFR calc Af Amer: >60 mL/min (ref >60)
GFR calc non Af Amer: >60 mL/min (ref >60)
Glucose, Bld: 105 mg/dL — ABNORMAL HIGH (ref 70–99)
Potassium: 3.9 mmol/L (ref 3.5–5.1)
Sodium: 140 mmol/L (ref 135–145)

## 2019-07-05 LAB — GLUCOSE, CAPILLARY
Glucose-Capillary: 86 mg/dL (ref 70–99)
Glucose-Capillary: 78 mg/dL (ref 70–99)
Glucose-Capillary: 97 mg/dL (ref 70–99)
Glucose-Capillary: 105 mg/dL — ABNORMAL HIGH (ref 70–99)
Glucose-Capillary: 133 mg/dL — ABNORMAL HIGH (ref 70–99)
Glucose-Capillary: 84 mg/dL (ref 70–99)
Glucose-Capillary: 96 mg/dL (ref 70–99)

## 2019-07-05 LAB — HEPARIN LEVEL (UNFRACTIONATED): Heparin Unfractionated: 0.4 IU/mL (ref 0.30–0.70)

## 2019-07-05 LAB — MAGNESIUM: Magnesium: 1.5 mg/dL — ABNORMAL LOW (ref 1.7–2.4)

## 2019-07-05 MED ORDER — AMIODARONE HCL 200 MG PO TABS
200.0000 mg | ORAL_TABLET | Freq: Every day | ORAL | Status: DC
  Administered 2019-07-06 – 2019-07-10 (×5): 200 mg via ORAL
  Filled 2019-07-05 (×5): qty 1

## 2019-07-05 MED ORDER — MAGNESIUM SULFATE 4 GM/100ML IV SOLN
4.0000 g | Freq: Once | INTRAVENOUS | Status: AC
  Administered 2019-07-05: 4 g via INTRAVENOUS
  Filled 2019-07-05: qty 100

## 2019-07-05 MED ORDER — POTASSIUM CHLORIDE CRYS ER 20 MEQ PO TBCR
20.0000 meq | EXTENDED_RELEASE_TABLET | Freq: Once | ORAL | Status: AC
  Administered 2019-07-05: 20 meq via ORAL
  Filled 2019-07-05: qty 1

## 2019-07-05 NOTE — Progress Notes (Signed)
ANTICOAGULATION CONSULT NOTE - Follow Up Consult  Pharmacy Consult for heparin Indication: atrial fibrillation  Allergies  Allergen Reactions  . Benadryl [Diphenhydramine] Shortness Of Breath  . Penicillins Diarrhea    Did it involve swelling of the face/tongue/throat, SOB, or low BP? No Did it involve sudden or severe rash/hives, skin peeling, or any reaction on the inside of your mouth or nose? No Did you need to seek medical attention at a hospital or doctor's office? No When did it last happen?2 months If all above answers are "NO", may proceed with cephalosporin use.  . Adhesive [Tape] Other (See Comments)    Burning of skin  . Amoxicillin Diarrhea and Other (See Comments)    Has patient had a PCN reaction causing immediate rash, facial/tongue/throat swelling, SOB or lightheadedness with hypotension: No Has patient had a PCN reaction causing severe rash involving mucus membranes or skin necrosis: No Has patient had a PCN reaction that required hospitalization No Has patient had a PCN reaction occurring within the last 10 years: Yes -- noted rxn as diarrhea If all of the above answers are "NO", then may proceed with Cephalosporin use.   . Cyclobenzaprine Other (See Comments)    Causes restless leg syndrome Restless syndrome  . Hydrocodone Itching    Patient Measurements: Height: 5\' 1"  (154.9 cm) Weight: 70.5 kg (155 lb 6.8 oz) IBW/kg (Calculated) : 47.8 Heparin Dosing Weight: 73 kg   Vital Signs: Temp: 97.5 F (36.4 C) (04/30 1132) Temp Source: Oral (04/30 1132) BP: 93/40 (04/30 1132) Pulse Rate: 64 (04/30 1132)  Labs: Recent Labs    07/03/19 0325 07/03/19 0325 07/03/19 1458 07/04/19 0209 07/05/19 0230  HGB 10.0*   < >  --  10.1* 10.2*  HCT 31.4*  --   --  32.6* 32.7*  PLT 188  --   --  188 220  HEPARINUNFRC 0.28*   < > 0.39 0.51 0.40  CREATININE 0.99  --   --  0.99 0.96   < > = values in this interval not displayed.    Estimated Creatinine  Clearance: 51.1 mL/min (by C-G formula based on SCr of 0.96 mg/dL).   Medical History: Past Medical History:  Diagnosis Date  . Anemia   . Anginal pain ([place])    jaw pain 07/2016, s/p 07/15/16 nuclear stress test  . Anxiety   . Arthritis    "lower back" (08/24/2017)  . Asthma   . CAD (coronary artery disease), native coronary artery    a. Hx CABG 1998, last PCI 2013 b.LHC 07/2013:  EF 55-60%, L-LAD prob atretic, no sig disease in LAD, S-OM1 ok with patent stent, S-dRCA ok => med Rx. c. LHC 01/2015 DES to SVG-RCA.  Marland Kitchen Chronic back pain   . Chronic leg pain   . Chronic lower back pain   . COPD (chronic obstructive pulmonary disease) ([place])   . Depression   . DVT (deep venous thrombosis) ([facility]) X 3   RLE  . Fall 08/2016  . GERD (gastroesophageal reflux disease)   . Glucose intolerance (impaired glucose tolerance)   . Headache    "a few/month" (08/24/2017)  . HLD (hyperlipidemia)   . Hypertension   . Myocardial infarction ([place]) 1999  . OSA on CPAP   . PAD (peripheral artery disease) ([place])   . PAF (paroxysmal atrial fibrillation) ([place])    a. 09/2013 post-op b. 01/2015  . Peripheral neuropathy   . Pneumonia   . Shortness of breath    when walking  .  Wears glasses     Medications:  Scheduled:  . amiodarone  200 mg Oral BID  . aspirin  81 mg Oral Daily  . atorvastatin  80 mg Oral Daily  . buPROPion  150 mg Oral Daily  . Chlorhexidine Gluconate Cloth  6 each Topical Daily  . digoxin  0.125 mg Oral Daily  . ezetimibe  10 mg Oral Daily  . furosemide  40 mg Oral Daily  . insulin aspart  0-9 Units Subcutaneous Q4H  . LORazepam  0.5 mg Intravenous Once  . losartan  25 mg Oral Daily  . oxyCODONE  10 mg Oral Q12H  . pantoprazole  40 mg Oral Daily  . potassium chloride  20 mEq Oral Once  . pramipexole  0.25 mg Oral QHS  . sertraline  150 mg Oral QHS  . sodium chloride flush  3 mL Intravenous Q12H  . spironolactone  25 mg Oral Daily    Assessment: 82 yof s/p aortobifemoral bypass  and impella 5.5. 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   Okay per vascular to restart heparin infusion on 4/26 - will use lower goal - Hgb up 10.2, pltc 220 no bleeding noted  - Heparin level is within goal at 0.4 on heparin drip rate 900 uts/hr   Goal of Therapy:  Heparin level 0.3-0.5 Monitor platelets by anticoagulation protocol: Yes   Plan:  Continue IV heparin infusion at 900 units/hr Monitor daily CBC, heparin level, and for s/sx of bleeding Initial plan was to switch to Eliquis when patient went to CIR, but it appears she is not going to CIR.  Can we switch soon?  Marguerite Olea, [facility] Clinical Pharmacist Phone [phone]  07/05/2019 2:12 PM     Please check [email or web address] for unit-specific pharmacist phone numbers

## 2019-07-05 NOTE — Progress Notes (Addendum)
Progress Note    07/05/2019 8:03 AM 5 Days Post-Op  Subjective: Complaining of left arm soreness, backside pain.  No shortness of breath  Was denied CIR and patient refusing SNF placement  Vitals:   07/05/19 0402 07/05/19 0759  BP: 105/67 (!) 108/49  Pulse: 65 61  Resp: 13 13  Temp: 98 F (36.7 C) 98.3 F (36.8 C)  SpO2: 99% 98%    Physical Exam: Cardiac:  Rate and rhythm are regular Lungs: Clear to auscultation bilaterally Incisions: Right anterior chest, left upper extremity, midline and bilateral groin incisions are all well approximated without signs of infection Extremities: Both feet are warm and well perfused.  Palpable 2+ dorsalis pedis pulses bilaterally.  Active range of motion.  Sensation intact Abdomen: Soft, nondistended, active bowel sounds  CBC    Component Value Date/Time   WBC 5.0 07/05/2019 0230   RBC 3.50 (L) 07/05/2019 0230   HGB 10.2 (L) 07/05/2019 0230   HGB 13.4 06/04/2019 1036   HCT 32.7 (L) 07/05/2019 0230   HCT 41.7 06/04/2019 1036   PLT 220 07/05/2019 0230   PLT 205 06/04/2019 1036   MCV 93.4 07/05/2019 0230   MCV 86 06/04/2019 1036   MCH 29.1 07/05/2019 0230   MCHC 31.2 07/05/2019 0230   RDW 16.8 (H) 07/05/2019 0230   RDW 17.4 (H) 06/04/2019 1036   LYMPHSABS 0.6 (L) 07/03/2019 0325   MONOABS 0.3 07/03/2019 0325   EOSABS 0.1 07/03/2019 0325   BASOSABS 0.0 07/03/2019 0325    BMET    Component Value Date/Time   NA 140 07/05/2019 0230   NA 141 06/04/2019 1036   K 3.9 07/05/2019 0230   CL 98 07/05/2019 0230   CO2 30 07/05/2019 0230   GLUCOSE 105 (H) 07/05/2019 0230   BUN 11 07/05/2019 0230   BUN 14 06/04/2019 1036   CREATININE 0.96 07/05/2019 0230   CREATININE 0.96 08/13/2015 0939   CALCIUM 8.4 (L) 07/05/2019 0230   GFRNONAA >60 07/05/2019 0230   GFRAA >60 07/05/2019 0230     Intake/Output Summary (Last 24 hours) at 07/05/2019 0803 Last data filed at 07/05/2019 0600 Gross per 24 hour  Intake 826.92 ml  Output 2400 ml    Net -1573.08 ml    HOSPITAL MEDICATIONS Scheduled Meds: . amiodarone  200 mg Oral BID  . aspirin  81 mg Oral Daily  . atorvastatin  80 mg Oral Daily  . buPROPion  150 mg Oral Daily  . carvedilol  3.125 mg Oral BID WC  . Chlorhexidine Gluconate Cloth  6 each Topical Daily  . digoxin  0.125 mg Oral Daily  . ezetimibe  10 mg Oral Daily  . furosemide  40 mg Oral Daily  . insulin aspart  0-9 Units Subcutaneous Q4H  . LORazepam  0.5 mg Intravenous Once  . losartan  25 mg Oral Daily  . oxyCODONE  10 mg Oral Q12H  . pantoprazole  40 mg Oral Daily  . pramipexole  0.25 mg Oral QHS  . sertraline  150 mg Oral QHS  . sodium chloride flush  3 mL Intravenous Q12H  . spironolactone  25 mg Oral Daily   Continuous Infusions: . sodium chloride    . heparin 950 Units/hr (07/05/19 0600)   PRN Meds:.sodium chloride, acetaminophen **OR** acetaminophen, ALPRAZolam, alum & mag hydroxide-simeth, diphenhydrAMINE **OR** diphenhydrAMINE, docusate sodium, fentaNYL (SUBLIMAZE) injection, gabapentin, guaiFENesin-dextromethorphan, metoprolol tartrate, nitroGLYCERIN, ondansetron, phenol, polyethylene glycol, sodium chloride flush  Assessment:  69 y.o. female is s/p:  ABF bypass with  post op cardiac evetn requiring impella; LHC with subsequent repair of L brachial artery.  Hemoglobin stable last 3 days.  Diuresis with Lasix and spironolactone.  5 Days Post-Op  Plan: -Continue heparin infusion and discuss timing of transitioning back to Xarelto -DVT prophylaxis: Heparin infusion   Risa Grill, PA-C [facility] [phone] 07/05/2019  8:03 AM   I have independently interviewed patient and agree with PA assessment and plan above.  We will transition back to Xarelto prior to discharge.  Patient states that she is going home and can hopefully have her strong enough for this early next week.  Mckenzie Bove C. Donzetta Matters, MD [facility] [place] Office: [phone] Pager:  [phone]

## 2019-07-05 NOTE — Progress Notes (Addendum)
Patient ID: Wendy [name], female   DOB: 1950-05-28, 69 y.o.   MRN: [id number]    Advanced Heart Failure Rounding Note  PCP-Cardiologist: [name], MD   Subjective:    - 4/13 aortobifemoral bypass - Impella 5.5 placement 4/19 for cardiogenic shock.  - Extubated 4/20 - LHC 4/22  (left brachial access): All grafts occluded (known), totally occluded RCA, OM1, OM3, 80% dLCx, 60% pLAD.  No change, no intervention.  - Impella 5.5 removed 4/23 - 4/25 S/P Emergent Repair of Left Brachial Artery Pseudoaneurysm - 4/27 Transfused 2 unit PRBCs  Feels very tired today. Not eating much. BP low in the 0000000 systolic. HR upper 40s-low 50s, sinus. Got 2nd dose of coreg this morning.   CIR recommended but pt declined. Planning return home w/ home health services.   Objective:   Weight Range: 70.5 kg Body mass index is 29.37 kg/m.   Vital Signs:   Temp:  [97.5 F (36.4 C)-98.3 F (36.8 C)] 97.5 F (36.4 C) (04/30 1132) Pulse Rate:  [60-67] 64 (04/30 1132) Resp:  [13-17] 13 (04/30 0759) BP: (93-124)/(40-67) 93/40 (04/30 1132) SpO2:  [96 %-99 %] 98 % (04/30 1132) Last BM Date: 07/02/19  Weight change: Filed Weights   06/27/19 0500 06/28/19 0527 07/03/19 0600  Weight: 75.5 kg 73 kg 70.5 kg    Intake/Output:   Intake/Output Summary (Last 24 hours) at 07/05/2019 1213 Last data filed at 07/05/2019 0600 Gross per 24 hour  Intake 826.92 ml  Output 1600 ml  Net -773.08 ml      Physical Exam   PHYSICAL EXAM: General:  Fatigue appearing. No respiratory difficulty HEENT: normal Neck: supple. no JVD. Carotids 2+ bilat; no bruits. No lymphadenopathy or thyromegaly appreciated. Cor: PMI nondisplaced. Regular rate & rhythm. No rubs, gallops or murmurs. Lungs: clear Abdomen: soft, nontender, nondistended. No hepatosplenomegaly. No bruits or masses. Good bowel sounds. Extremities: no cyanosis, clubbing, rash, edema Neuro: alert & oriented x 3, cranial nerves grossly intact. moves all  4 extremities w/o difficulty. Affect pleasant.    Telemetry   Sinus bradycardia, upper 40s-low 50s  Labs    CBC Recent Labs    07/03/19 0325 07/03/19 0325 07/04/19 0209 07/05/19 0230  WBC 4.9   < > 3.9* 5.0  NEUTROABS 3.8  --   --   --   HGB 10.0*   < > 10.1* 10.2*  HCT 31.4*   < > 32.6* 32.7*  MCV 92.4   < > 94.2 93.4  PLT 188   < > 188 220   < > = values in this interval not displayed.   Basic Metabolic Panel Recent Labs    07/04/19 0209 07/05/19 0230  NA 141 140  K 3.9 3.9  CL 102 98  CO2 31 30  GLUCOSE 89 105*  BUN 10 11  CREATININE 0.99 0.96  CALCIUM 8.5* 8.4*  MG 1.7 1.5*   Liver Function Tests No results for input(s): AST, ALT, ALKPHOS, BILITOT, PROT, ALBUMIN in the last 72 hours. No results for input(s): LIPASE, AMYLASE in the last 72 hours. Cardiac Enzymes No results for input(s): CKTOTAL, CKMB, CKMBINDEX, TROPONINI in the last 72 hours.  BNP: BNP (last 3 results) Recent Labs    06/19/19 1239  BNP 366.7*    ProBNP (last 3 results) No results for input(s): PROBNP in the last 8760 hours.   D-Dimer No results for input(s): DDIMER in the last 72 hours. Hemoglobin A1C No results for input(s): HGBA1C in the last 72  hours. Fasting Lipid Panel No results for input(s): CHOL, HDL, LDLCALC, TRIG, CHOLHDL, LDLDIRECT in the last 72 hours. Thyroid Function Tests No results for input(s): TSH, T4TOTAL, T3FREE, THYROIDAB in the last 72 hours.  Invalid input(s): FREET3  Other results:   Imaging    No results found.   Medications:     Scheduled Medications: . amiodarone  200 mg Oral BID  . aspirin  81 mg Oral Daily  . atorvastatin  80 mg Oral Daily  . buPROPion  150 mg Oral Daily  . carvedilol  3.125 mg Oral BID WC  . Chlorhexidine Gluconate Cloth  6 each Topical Daily  . digoxin  0.125 mg Oral Daily  . ezetimibe  10 mg Oral Daily  . furosemide  40 mg Oral Daily  . insulin aspart  0-9 Units Subcutaneous Q4H  . LORazepam  0.5 mg  Intravenous Once  . losartan  25 mg Oral Daily  . oxyCODONE  10 mg Oral Q12H  . pantoprazole  40 mg Oral Daily  . pramipexole  0.25 mg Oral QHS  . sertraline  150 mg Oral QHS  . sodium chloride flush  3 mL Intravenous Q12H  . spironolactone  25 mg Oral Daily    Infusions: . sodium chloride    . heparin 950 Units/hr (07/05/19 0600)    PRN Medications: sodium chloride, acetaminophen **OR** acetaminophen, ALPRAZolam, alum & mag hydroxide-simeth, diphenhydrAMINE **OR** diphenhydrAMINE, docusate sodium, fentaNYL (SUBLIMAZE) injection, gabapentin, guaiFENesin-dextromethorphan, metoprolol tartrate, nitroGLYCERIN, ondansetron, phenol, polyethylene glycol, sodium chloride flush   Assessment/Plan   1. Cardiogenic shock: Progressive worsening 4/19 culminating in PEA arrest.  Lactate 8.1.  She had episodes of hypotension and atrial fibrillation/RVR earlier in her post-op course as well.  Symptoms were dyspnea and anxiety no chest pain.  No STEMI on ECG but concern for possible coronary event based on history.  Initial troponin 1802 => 2957.  Initially on NE, epi then Impella 5.5 placed on 4/19.  Now MAP is stable and off pressors.  Lactate has normalized. Impella 5.5 out 4/23.  - Resolved.  2. Acute systolic CHF: Suspect ischemic cardiomyopathy.  Echo 06/24/19 EF 20-25%, diffuse hypokinesis, preserved RV function.  Pre-op echo 06/17/19 EF 60%.  HS-TnI 1802 => 2957, elevated but not markedly high.  [place] 4/22 showed unchanged coronary anatomy, suspect patient got ischemic with hypotension.  Impella 5.5 out 4/23.  - Off entresto 4/25 due hypotension.  - Tolerating Losartan, continue 25 mg daily - Continue digoxin - Continue spironolactone 25 mg daily  - Coreg 3.125 added but bradycardic in the upper 40s and low SBP in 90s. Symptomatic w/ fatigue. Will d/c for now   - looks euvolemic on exam. Will reorder daily wts    - Continue Lasix 40 mg daily.   3. AKI: Creatinine as high as 2.86, now normalized.      4. CAD: LHC in 3/21 showed occluded SVG-RCA and SVG-OM and atretic LIMA-LAD.  The LAD was patent with 60% mid vessel stenosis, the RCA was occluded with collaterals, and OM1 and OM2 were occluded.  No intervention.  Surprisingly, EF reported as normal pre-op.  No chest pain but had dyspnea and anxiety today.  ECG did not show STEMI, showed fairly stable lateral TWIs.  HS-TnI to 2957. [place] 4/22 showed unchanged coronary anatomy, suspect she was ischemic with hypotension.  No interventional target.   - Continue statin, ASA for now. Will eventually stop ASA as she will need long-term oral anticoagulation (Eliquis) unless vascular wants an antiplatelet  agent post-aortobifemoral bypass.  5. PAD: S/p aortobifemoral bypass on 4/13.  Surgical site is stable, she has had palpable PT pulses.  6. Acute hypoxemic respiratory failure: Intubated with PEA arrest. Resolved, extubated.  Chest still sore from CPR.  - Resolved 7. Atrial fibrillation: Paroxysmal.  She has has been in and out of atrial fibrillation post-op.  Has had episodes of junctional rhythm also.  Maintaining NSR/ sinus brady.  - Continue amio 200 mg twice a day. - On heparin gtt => transition to Eliquis when ok per surgery.  8. Thrombocytopenia: Post-Impella placement.  - Resolved.  9. Anemia, post-op: hgb 7.6 -> 7.8->7.5 -> 6.3 -> transfused 2 units -> 10. Ecchymosis left brachial site.  10. CP: ECG improved. Doubt ischemic.  Suspect related to CPR.  11. Deconditioning - Continue PT/OT - Pt declined SNF. Will plan home health services   12. 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    Length of Stay: [address], PA-C  07/05/2019, 12:13 PM  Advanced Heart Failure Team Pager [phone] (M-F; 7a - 4p)  Please contact [facility] for night-coverage after hours (4p -7a ) and weekends on [email or web address]  Agree with the above note.    HR lower in 40s-50s today, will stop  Coreg as above.  Can also decrease amiodarone to 200 mg daily.  Otherwise, no changes.  Would aim to get her on bisoprolol 2.5 mg daily in future.    Volume status ok.  Hgb ok.   Loralie Champagne 07/05/2019

## 2019-07-06 LAB — BASIC METABOLIC PANEL
Anion gap: 8 (ref 5–15)
BUN: 12 mg/dL (ref 8–23)
CO2: 34 mmol/L — ABNORMAL HIGH (ref 22–32)
Calcium: 8.6 mg/dL — ABNORMAL LOW (ref 8.9–10.3)
Chloride: 97 mmol/L — ABNORMAL LOW (ref 98–111)
Creatinine, Ser: 0.96 mg/dL (ref 0.44–1.00)
GFR calc Af Amer: >60 mL/min (ref >60)
GFR calc non Af Amer: >60 mL/min (ref >60)
Glucose, Bld: 90 mg/dL (ref 70–99)
Potassium: 4.4 mmol/L (ref 3.5–5.1)
Sodium: 139 mmol/L (ref 135–145)

## 2019-07-06 LAB — GLUCOSE, CAPILLARY
Glucose-Capillary: 109 mg/dL — ABNORMAL HIGH (ref 70–99)
Glucose-Capillary: 120 mg/dL — ABNORMAL HIGH (ref 70–99)
Glucose-Capillary: 121 mg/dL — ABNORMAL HIGH (ref 70–99)
Glucose-Capillary: 126 mg/dL — ABNORMAL HIGH (ref 70–99)
Glucose-Capillary: 82 mg/dL (ref 70–99)
Glucose-Capillary: 89 mg/dL (ref 70–99)
Glucose-Capillary: 93 mg/dL (ref 70–99)

## 2019-07-06 LAB — CBC
HCT: 33.8 % — ABNORMAL LOW (ref 36.0–46.0)
Hemoglobin: 10.4 g/dL — ABNORMAL LOW (ref 12.0–15.0)
MCH: 29 pg (ref 26.0–34.0)
MCHC: 30.8 g/dL (ref 30.0–36.0)
MCV: 94.2 fL (ref 80.0–100.0)
Platelets: 231 10*3/uL (ref 150–400)
RBC: 3.59 MIL/uL — ABNORMAL LOW (ref 3.87–5.11)
RDW: 16.7 % — ABNORMAL HIGH (ref 11.5–15.5)
WBC: 5.8 10*3/uL (ref 4.0–10.5)
nRBC: 0 % (ref 0.0–0.2)

## 2019-07-06 LAB — HEPARIN LEVEL (UNFRACTIONATED)
Heparin Unfractionated: 0.28 IU/mL — ABNORMAL LOW (ref 0.30–0.70)
Heparin Unfractionated: 0.37 IU/mL (ref 0.30–0.70)
Heparin Unfractionated: 0.41 IU/mL (ref 0.30–0.70)

## 2019-07-06 LAB — MAGNESIUM: Magnesium: 2.3 mg/dL (ref 1.7–2.4)

## 2019-07-06 NOTE — Plan of Care (Signed)
  Problem: Clinical Measurements: Goal: Postoperative complications will be avoided or minimized Outcome: Progressing   Problem: Clinical Measurements: Goal: Ability to maintain clinical measurements within normal limits will improve Outcome: Progressing   Problem: Clinical Measurements: Goal: Cardiovascular complication will be avoided Outcome: Progressing   Problem: Clinical Measurements: Goal: Cardiovascular complication will be avoided Outcome: Progressing

## 2019-07-06 NOTE — TOC Progression Note (Signed)
Transition of Care [facility]) - Progression Note    Patient Details  Name: Wendy [name] MRN: [id number] Date of Birth: 12/30/1950  [facility] CM/SW [place], [place] Phone Number: 07/06/2019, 11:34 AM  Clinical Narrative:     CSW visit with the patient at bedside. Patient had previously declined SNF but is now agreeable to short term rehab at [facility]. Patient states her goal is to eventually get back home. CSW explained the SNf process and gave her Medicare.gov SNF listing. Patient requested CSW  call her sister, [name].   CSW updated patient's sister on discharge plan as requested. CSW will follow up with patient and her sister tomorrow for SNF choice.  CSW will continue to follow and assist with discharge planning.  Thurmond Butts, MSW, [place] Clinical Social Worker   Expected Discharge Plan: [place] Barriers to Discharge: Continued Medical Work up  Expected Discharge Plan and Services Expected Discharge Plan: [place] In-house Referral: Clinical Social Work                                             Social Determinants of Health (SDOH) Interventions    Readmission Risk Interventions No flowsheet data found.

## 2019-07-06 NOTE — Progress Notes (Signed)
ANTICOAGULATION CONSULT NOTE - Follow Up Consult  Pharmacy Consult for heparin Indication: atrial fibrillation  Allergies  Allergen Reactions  . Benadryl [Diphenhydramine] Shortness Of Breath  . Penicillins Diarrhea    Did it involve swelling of the face/tongue/throat, SOB, or low BP? No Did it involve sudden or severe rash/hives, skin peeling, or any reaction on the inside of your mouth or nose? No Did you need to seek medical attention at a hospital or doctor's office? No When did it last happen?2 months If all above answers are "NO", may proceed with cephalosporin use.  . Adhesive [Tape] Other (See Comments)    Burning of skin  . Amoxicillin Diarrhea and Other (See Comments)    Has patient had a PCN reaction causing immediate rash, facial/tongue/throat swelling, SOB or lightheadedness with hypotension: No Has patient had a PCN reaction causing severe rash involving mucus membranes or skin necrosis: No Has patient had a PCN reaction that required hospitalization No Has patient had a PCN reaction occurring within the last 10 years: Yes -- noted rxn as diarrhea If all of the above answers are "NO", then may proceed with Cephalosporin use.   . Cyclobenzaprine Other (See Comments)    Causes restless leg syndrome Restless syndrome  . Hydrocodone Itching    Patient Measurements: Height: 5\' 1"  (154.9 cm) Weight: 70.5 kg (155 lb 6.8 oz) IBW/kg (Calculated) : 47.8 Heparin Dosing Weight: 73 kg   Vital Signs: Temp: 97.8 F (36.6 C) (05/01 1624) Temp Source: Oral (05/01 1624) BP: 108/45 (05/01 1200) Pulse Rate: 63 (05/01 1624)  Labs: Recent Labs    07/04/19 0209 07/04/19 0209 07/05/19 0230 07/05/19 0230 07/06/19 0235 07/06/19 1036 07/06/19 1602  HGB 10.1*   < > 10.2*  --  10.4*  --   --   HCT 32.6*  --  32.7*  --  33.8*  --   --   PLT 188  --  220  --  231  --   --   HEPARINUNFRC 0.51   < > 0.40   < > 0.28* 0.37 0.41  CREATININE 0.99  --  0.96  --  0.96  --   --     < > = values in this interval not displayed.    Estimated Creatinine Clearance: 51.1 mL/min (by C-G formula based on SCr of 0.96 mg/dL).   Medical History: Past Medical History:  Diagnosis Date  . Anemia   . Anginal pain ([place])    jaw pain 07/2016, s/p 07/15/16 nuclear stress test  . Anxiety   . Arthritis    "lower back" (08/24/2017)  . Asthma   . CAD (coronary artery disease), native coronary artery    a. Hx CABG 1998, last PCI 2013 b.LHC 07/2013:  EF 55-60%, L-LAD prob atretic, no sig disease in LAD, S-OM1 ok with patent stent, S-dRCA ok => med Rx. c. LHC 01/2015 DES to SVG-RCA.  Marland Kitchen Chronic back pain   . Chronic leg pain   . Chronic lower back pain   . COPD (chronic obstructive pulmonary disease) ([place])   . Depression   . DVT (deep venous thrombosis) ([facility]) X 3   RLE  . Fall 08/2016  . GERD (gastroesophageal reflux disease)   . Glucose intolerance (impaired glucose tolerance)   . Headache    "a few/month" (08/24/2017)  . HLD (hyperlipidemia)   . Hypertension   . Myocardial infarction ([place]) 1999  . OSA on CPAP   . PAD (peripheral artery disease) ([place])   .  PAF (paroxysmal atrial fibrillation) ([place])    a. 09/2013 post-op b. 01/2015  . Peripheral neuropathy   . Pneumonia   . Shortness of breath    when walking  . Wears glasses     Medications:  Scheduled:  . amiodarone  200 mg Oral Daily  . aspirin  81 mg Oral Daily  . atorvastatin  80 mg Oral Daily  . buPROPion  150 mg Oral Daily  . Chlorhexidine Gluconate Cloth  6 each Topical Daily  . digoxin  0.125 mg Oral Daily  . ezetimibe  10 mg Oral Daily  . furosemide  40 mg Oral Daily  . insulin aspart  0-9 Units Subcutaneous Q4H  . LORazepam  0.5 mg Intravenous Once  . losartan  25 mg Oral Daily  . oxyCODONE  10 mg Oral Q12H  . pantoprazole  40 mg Oral Daily  . pramipexole  0.25 mg Oral QHS  . sertraline  150 mg Oral QHS  . sodium chloride flush  3 mL Intravenous Q12H  . spironolactone  25 mg Oral Daily     Assessment: 81 yof s/p aortobifemoral bypass and impella 5.5. 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 Pt on heparin for a fib.   Heparin level remains therapeutic on 1000 units/hr of IV heparin. RN reports no s/s of bleeding.   Goal of Therapy:  Heparin level 0.3-0.5 Monitor platelets by anticoagulation protocol: Yes   Plan:  Continue IV heparin infusion at 1000 units/hr Monitor daily CBC, heparin level, and for s/sx of bleeding Switch to DOAC when okay per VSS. Planning to do so closer to discharge  Albertina Parr, PharmD., BCPS, BCCCP Clinical Pharmacist Please refer to [facility] for unit-specific pharmacist

## 2019-07-06 NOTE — Progress Notes (Signed)
[facility] [place]  Subjective  -was denied admission to CIR by her insurance.  States she has now reconsidered and wants to go to a SNF.   Objective (!) 110/50 69 98 F (36.7 C) (Oral) 13 98%  Intake/Output Summary (Last 24 hours) at 07/06/2019 1013 Last data filed at 07/06/2019 0759 Gross per 24 hour  Intake 483 ml  Output 800 ml  Net -317 ml    0.5 L [place] Abdomen soft Midline and groin incisions clean and dry Feet warm, no tissue loss  Laboratory Lab Results: Recent Labs    07/05/19 0230 07/06/19 0235  WBC 5.0 5.8  HGB 10.2* 10.4*  HCT 32.7* 33.8*  PLT 220 231   BMET Recent Labs    07/05/19 0230 07/06/19 0235  NA 140 139  K 3.9 4.4  CL 98 97*  CO2 30 34*  GLUCOSE 105* 90  BUN 11 12  CREATININE 0.96 0.96  CALCIUM 8.4* 8.6*    COAG Lab Results  Component Value Date   INR 1.8 (H) 06/24/2019   INR 1.4 (H) 06/18/2019   INR 1.6 (H) 06/14/2019   No results found for: PTT  Assessment/Planning:  69 year old female status post aortobifemoral bypass complicated by PEA arrest requiring Impella subsequent heart cath and left brachial artery pseudoaneurysm repair.  Overall appears stable clinically but weak.  She is on heparin for her chronic A. fib and will transition back to Xarelto when she is closer to discharge.  She is now amendable to SNF after she was denied CIR by her insurance.  We will touch base with social worker and discussed likely will be next week before that can be arranged.  Tolerating p.o.  Marty Heck 07/06/2019 10:13 AM --

## 2019-07-06 NOTE — Progress Notes (Signed)
ANTICOAGULATION CONSULT NOTE - Follow Up Consult  Pharmacy Consult for heparin Indication: atrial fibrillation  Labs: Recent Labs    07/04/19 0209 07/04/19 0209 07/05/19 0230 07/06/19 0235  HGB 10.1*   < > 10.2* 10.4*  HCT 32.6*  --  32.7* 33.8*  PLT 188  --  220 231  HEPARINUNFRC 0.51  --  0.40 0.28*  CREATININE 0.99  --  0.96 0.96   < > = values in this interval not displayed.    Assessment: 69yo female subtherapeutic on heparin after a few levels at goal though had been trending down; no gtt issues though RN notes some bleeding at the beginning of the shift at IV site, no further bleeding after IV site was changed.  Goal of Therapy:  Heparin level 0.3-0.5 units/ml   Plan:  Will increase heparin gtt slightly from 950 units/hr to 1000 units/hr and check level in 6 hours.    Wynona Neat, PharmD, BCPS  07/06/2019,4:08 AM

## 2019-07-06 NOTE — Plan of Care (Signed)
Continue to monitor

## 2019-07-06 NOTE — Progress Notes (Addendum)
ANTICOAGULATION CONSULT NOTE - Follow Up Consult  Pharmacy Consult for heparin Indication: atrial fibrillation  Allergies  Allergen Reactions  . Benadryl [Diphenhydramine] Shortness Of Breath  . Penicillins Diarrhea    Did it involve swelling of the face/tongue/throat, SOB, or low BP? No Did it involve sudden or severe rash/hives, skin peeling, or any reaction on the inside of your mouth or nose? No Did you need to seek medical attention at a hospital or doctor's office? No When did it last happen?2 months If all above answers are "NO", may proceed with cephalosporin use.  . Adhesive [Tape] Other (See Comments)    Burning of skin  . Amoxicillin Diarrhea and Other (See Comments)    Has patient had a PCN reaction causing immediate rash, facial/tongue/throat swelling, SOB or lightheadedness with hypotension: No Has patient had a PCN reaction causing severe rash involving mucus membranes or skin necrosis: No Has patient had a PCN reaction that required hospitalization No Has patient had a PCN reaction occurring within the last 10 years: Yes -- noted rxn as diarrhea If all of the above answers are "NO", then may proceed with Cephalosporin use.   . Cyclobenzaprine Other (See Comments)    Causes restless leg syndrome Restless syndrome  . Hydrocodone Itching    Patient Measurements: Height: 5\' 1"  (154.9 cm) Weight: 70.5 kg (155 lb 6.8 oz) IBW/kg (Calculated) : 47.8 Heparin Dosing Weight: 73 kg   Vital Signs: Temp: 98 F (36.7 C) (05/01 0759) Temp Source: Oral (05/01 0759) BP: 110/50 (05/01 0759) Pulse Rate: 69 (05/01 0932)  Labs: Recent Labs    07/04/19 0209 07/04/19 0209 07/05/19 0230 07/06/19 0235 07/06/19 1036  HGB 10.1*   < > 10.2* 10.4*  --   HCT 32.6*  --  32.7* 33.8*  --   PLT 188  --  220 231  --   HEPARINUNFRC 0.51   < > 0.40 0.28* 0.37  CREATININE 0.99  --  0.96 0.96  --    < > = values in this interval not displayed.    Estimated Creatinine  Clearance: 51.1 mL/min (by C-G formula based on SCr of 0.96 mg/dL).   Medical History: Past Medical History:  Diagnosis Date  . Anemia   . Anginal pain ([place])    jaw pain 07/2016, s/p 07/15/16 nuclear stress test  . Anxiety   . Arthritis    "lower back" (08/24/2017)  . Asthma   . CAD (coronary artery disease), native coronary artery    a. Hx CABG 1998, last PCI 2013 b.LHC 07/2013:  EF 55-60%, L-LAD prob atretic, no sig disease in LAD, S-OM1 ok with patent stent, S-dRCA ok => med Rx. c. LHC 01/2015 DES to SVG-RCA.  Marland Kitchen Chronic back pain   . Chronic leg pain   . Chronic lower back pain   . COPD (chronic obstructive pulmonary disease) ([place])   . Depression   . DVT (deep venous thrombosis) ([facility]) X 3   RLE  . Fall 08/2016  . GERD (gastroesophageal reflux disease)   . Glucose intolerance (impaired glucose tolerance)   . Headache    "a few/month" (08/24/2017)  . HLD (hyperlipidemia)   . Hypertension   . Myocardial infarction ([place]) 1999  . OSA on CPAP   . PAD (peripheral artery disease) ([place])   . PAF (paroxysmal atrial fibrillation) ([place])    a. 09/2013 post-op b. 01/2015  . Peripheral neuropathy   . Pneumonia   . Shortness of breath    when walking  .  Wears glasses     Medications:  Scheduled:  . amiodarone  200 mg Oral Daily  . aspirin  81 mg Oral Daily  . atorvastatin  80 mg Oral Daily  . buPROPion  150 mg Oral Daily  . Chlorhexidine Gluconate Cloth  6 each Topical Daily  . digoxin  0.125 mg Oral Daily  . ezetimibe  10 mg Oral Daily  . furosemide  40 mg Oral Daily  . insulin aspart  0-9 Units Subcutaneous Q4H  . LORazepam  0.5 mg Intravenous Once  . losartan  25 mg Oral Daily  . oxyCODONE  10 mg Oral Q12H  . pantoprazole  40 mg Oral Daily  . pramipexole  0.25 mg Oral QHS  . sertraline  150 mg Oral QHS  . sodium chloride flush  3 mL Intravenous Q12H  . spironolactone  25 mg Oral Daily    Assessment: Wendy [name] s/p aortobifemoral bypass and impella 5.5. Underwent emergent  repair of L brachial artery pseudoaneurysm on 4/25. Pt on heparin for a fib.   - Hgb up 10.4, pltc 231 no bleeding noted  - Heparin level is within goal at 0.37 on heparin drip rate 1000 uts/hr   Goal of Therapy:  Heparin level 0.3-0.5 Monitor platelets by anticoagulation protocol: Yes   Plan:  Continue IV heparin infusion at 1000 units/hr Obtain 6hr confirmatory level Monitor daily CBC, heparin level, and for s/sx of bleeding Switch to DOAC when okay per VSS. Planning to do so closer to discharge  Sherren Kerns, PharmD PGY1 [place] Resident Phone [phone]  07/06/2019 11:48 AM     Please check [email or web address] for unit-specific pharmacist phone numbers

## 2019-07-07 LAB — HEPARIN LEVEL (UNFRACTIONATED)
Heparin Unfractionated: 0.25 IU/mL — ABNORMAL LOW (ref 0.30–0.70)
Heparin Unfractionated: 0.46 IU/mL (ref 0.30–0.70)
Heparin Unfractionated: 0.39 IU/mL (ref 0.30–0.70)

## 2019-07-07 LAB — GLUCOSE, CAPILLARY
Glucose-Capillary: 113 mg/dL — ABNORMAL HIGH (ref 70–99)
Glucose-Capillary: 124 mg/dL — ABNORMAL HIGH (ref 70–99)
Glucose-Capillary: 94 mg/dL (ref 70–99)
Glucose-Capillary: 100 mg/dL — ABNORMAL HIGH (ref 70–99)

## 2019-07-07 LAB — CBC
HCT: 31.2 % — ABNORMAL LOW (ref 36.0–46.0)
Hemoglobin: 9.6 g/dL — ABNORMAL LOW (ref 12.0–15.0)
MCH: 29.2 pg (ref 26.0–34.0)
MCHC: 30.8 g/dL (ref 30.0–36.0)
MCV: 94.8 fL (ref 80.0–100.0)
Platelets: 236 10*3/uL (ref 150–400)
RBC: 3.29 MIL/uL — ABNORMAL LOW (ref 3.87–5.11)
RDW: 16.6 % — ABNORMAL HIGH (ref 11.5–15.5)
WBC: 4.5 10*3/uL (ref 4.0–10.5)
nRBC: 0 % (ref 0.0–0.2)

## 2019-07-07 LAB — BASIC METABOLIC PANEL
Anion gap: 8 (ref 5–15)
BUN: 14 mg/dL (ref 8–23)
CO2: 34 mmol/L — ABNORMAL HIGH (ref 22–32)
Calcium: 8.8 mg/dL — ABNORMAL LOW (ref 8.9–10.3)
Chloride: 95 mmol/L — ABNORMAL LOW (ref 98–111)
Creatinine, Ser: 1.04 mg/dL — ABNORMAL HIGH (ref 0.44–1.00)
GFR calc Af Amer: >60 mL/min (ref >60)
GFR calc non Af Amer: 56 mL/min — ABNORMAL LOW (ref >60)
Glucose, Bld: 114 mg/dL — ABNORMAL HIGH (ref 70–99)
Potassium: 4.2 mmol/L (ref 3.5–5.1)
Sodium: 137 mmol/L (ref 135–145)

## 2019-07-07 LAB — MAGNESIUM: Magnesium: 1.8 mg/dL (ref 1.7–2.4)

## 2019-07-07 NOTE — Progress Notes (Addendum)
Progress Note    07/07/2019 9:04 AM 7 Days Post-Op  Subjective: Says she does not feel well, however she says she has these days prior to admission.  She is sipping her coffee has not eaten anything from her breakfast tray yet.  Having bowel movements  Case management followed up yesterday and is in conversation with the patient and her family regarding skilled nursing facility choices.  Vitals:   07/07/19 0410 07/07/19 0823  BP: (!) 92/38 (!) 102/31  Pulse: 63   Resp: 14 15  Temp: 97.8 F (36.6 C) 97.7 F (36.5 C)  SpO2: 100% 96%    Physical Exam: Cardiac: Heart rate and rhythm are regular Lungs: Clear to auscultation bilaterally Incisions: Midline abdominal incision and both groin incisions are well approximated. Extremities: 2+ palpable bilateral dorsalis pedis pulses.  She has an approximately 3 x 3 cm mass of the right forearm that she says is from tape.  More consistent with hematoma.  Nontender with ecchymosis, without increased warmth.  No erythema.  Left upper extremity with significant ecchymosis but soft.  Left AC incision is healing.  Hand is warm with active range of motion and intact sensation Abdomen: Soft, nondistended, normoactive bowel sounds  CBC    Component Value Date/Time   WBC 4.5 07/07/2019 0416   RBC 3.29 (L) 07/07/2019 0416   HGB 9.6 (L) 07/07/2019 0416   HGB 13.4 06/04/2019 1036   HCT 31.2 (L) 07/07/2019 0416   HCT 41.7 06/04/2019 1036   PLT 236 07/07/2019 0416   PLT 205 06/04/2019 1036   MCV 94.8 07/07/2019 0416   MCV 86 06/04/2019 1036   MCH 29.2 07/07/2019 0416   MCHC 30.8 07/07/2019 0416   RDW 16.6 (H) 07/07/2019 0416   RDW 17.4 (H) 06/04/2019 1036   LYMPHSABS 0.6 (L) 07/03/2019 0325   MONOABS 0.3 07/03/2019 0325   EOSABS 0.1 07/03/2019 0325   BASOSABS 0.0 07/03/2019 0325    BMET    Component Value Date/Time   NA 137 07/07/2019 0416   NA 141 06/04/2019 1036   K 4.2 07/07/2019 0416   CL 95 (L) 07/07/2019 0416   CO2 34 (H)  07/07/2019 0416   GLUCOSE 114 (H) 07/07/2019 0416   BUN 14 07/07/2019 0416   BUN 14 06/04/2019 1036   CREATININE 1.04 (H) 07/07/2019 0416   CREATININE 0.96 08/13/2015 0939   CALCIUM 8.8 (L) 07/07/2019 0416   GFRNONAA 56 (L) 07/07/2019 0416   GFRAA >60 07/07/2019 0416     Intake/Output Summary (Last 24 hours) at 07/07/2019 0904 Last data filed at 07/07/2019 [id number] Gross per 24 hour  Intake 476.11 ml  Output 1900 ml  Net -1423.89 ml    HOSPITAL MEDICATIONS Scheduled Meds: . amiodarone  200 mg Oral Daily  . aspirin  81 mg Oral Daily  . atorvastatin  80 mg Oral Daily  . buPROPion  150 mg Oral Daily  . Chlorhexidine Gluconate Cloth  6 each Topical Daily  . digoxin  0.125 mg Oral Daily  . ezetimibe  10 mg Oral Daily  . furosemide  40 mg Oral Daily  . insulin aspart  0-9 Units Subcutaneous Q4H  . LORazepam  0.5 mg Intravenous Once  . losartan  25 mg Oral Daily  . oxyCODONE  10 mg Oral Q12H  . pantoprazole  40 mg Oral Daily  . pramipexole  0.25 mg Oral QHS  . sertraline  150 mg Oral QHS  . sodium chloride flush  3 mL Intravenous Q12H  .  spironolactone  25 mg Oral Daily   Continuous Infusions: . sodium chloride    . heparin 1,100 Units/hr (07/07/19 0643)   PRN Meds:.sodium chloride, acetaminophen **OR** acetaminophen, ALPRAZolam, alum & mag hydroxide-simeth, diphenhydrAMINE **OR** diphenhydrAMINE, docusate sodium, fentaNYL (SUBLIMAZE) injection, gabapentin, guaiFENesin-dextromethorphan, metoprolol tartrate, nitroGLYCERIN, ondansetron, phenol, polyethylene glycol  Assessment:  69 y.o. female is s/p:  Aortobifemoral bypass 4/13 with subsequent PEA arrest.  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 Remains on heparin infusion. Significant deconditioning.  Searching SNF. 2-1/2 weeks Post-Op  Plan: -Skilled nursing facility placement.  Transition to Xarelto prior to discharge. -DVT prophylaxis:   Heparin infusion   Risa Grill, PA-C [facility] [phone] 07/07/2019  9:04 AM   I have seen and evaluated the patient. I agree with the PA note as documented above. 69 year old female status post aortobifemoral bypass complicated by PEA arrest requiring Impella subsequent heart cath and left brachial artery pseudoaneurysm repair.    Still appears very weak.  She is now amendable to SNF.  We did discuss with social work yesterday and will work toward placement.  Remains on heparin for A. fib.  Will hold Xarelto until closer to discharge.  Tolerating p.o. and had a bowel movement yesterday and some baseline nausea.  Palpable pedal pulses.   Marty Heck, MD [facility] [place] Office: [phone]

## 2019-07-07 NOTE — Progress Notes (Signed)
ANTICOAGULATION CONSULT NOTE - Follow Up Consult  Pharmacy Consult for heparin Indication: atrial fibrillation  Allergies  Allergen Reactions  . Benadryl [Diphenhydramine] Shortness Of Breath  . Penicillins Diarrhea    Did it involve swelling of the face/tongue/throat, SOB, or low BP? No Did it involve sudden or severe rash/hives, skin peeling, or any reaction on the inside of your mouth or nose? No Did you need to seek medical attention at a hospital or doctor's office? No When did it last happen?2 months If all above answers are "NO", may proceed with cephalosporin use.  . Adhesive [Tape] Other (See Comments)    Burning of skin  . Amoxicillin Diarrhea and Other (See Comments)    Has patient had a PCN reaction causing immediate rash, facial/tongue/throat swelling, SOB or lightheadedness with hypotension: No Has patient had a PCN reaction causing severe rash involving mucus membranes or skin necrosis: No Has patient had a PCN reaction that required hospitalization No Has patient had a PCN reaction occurring within the last 10 years: Yes -- noted rxn as diarrhea If all of the above answers are "NO", then may proceed with Cephalosporin use.   . Cyclobenzaprine Other (See Comments)    Causes restless leg syndrome Restless syndrome  . Hydrocodone Itching    Patient Measurements: Height: 5\' 1"  (154.9 cm) Weight: 67.1 kg (147 lb 14.9 oz) IBW/kg (Calculated) : 47.8 Heparin Dosing Weight: 73 kg   Vital Signs: Temp: 98.4 F (36.9 C) (05/02 1120) Temp Source: Oral (05/02 1120) BP: 95/48 (05/02 1120) Pulse Rate: 63 (05/02 1120)  Labs: Recent Labs    07/05/19 0230 07/05/19 0230 07/06/19 0235 07/06/19 1036 07/06/19 1602 07/07/19 0416 07/07/19 1039  HGB 10.2*   < > 10.4*  --   --  9.6*  --   HCT 32.7*  --  33.8*  --   --  31.2*  --   PLT 220  --  231  --   --  236  --   HEPARINUNFRC 0.40   < > 0.28*   < > 0.41 0.25* 0.46  CREATININE 0.96  --  0.96  --   --  1.04*  --     < > = values in this interval not displayed.    Estimated Creatinine Clearance: 46 mL/min (A) (by C-G formula based on SCr of 1.04 mg/dL (H)).   Medical History: Past Medical History:  Diagnosis Date  . Anemia   . Anginal pain ([place])    jaw pain 07/2016, s/p 07/15/16 nuclear stress test  . Anxiety   . Arthritis    "lower back" (08/24/2017)  . Asthma   . CAD (coronary artery disease), native coronary artery    a. Hx CABG 1998, last PCI 2013 b.LHC 07/2013:  EF 55-60%, L-LAD prob atretic, no sig disease in LAD, S-OM1 ok with patent stent, S-dRCA ok => med Rx. c. LHC 01/2015 DES to SVG-RCA.  Marland Kitchen Chronic back pain   . Chronic leg pain   . Chronic lower back pain   . COPD (chronic obstructive pulmonary disease) ([place])   . Depression   . DVT (deep venous thrombosis) ([facility]) X 3   RLE  . Fall 08/2016  . GERD (gastroesophageal reflux disease)   . Glucose intolerance (impaired glucose tolerance)   . Headache    "a few/month" (08/24/2017)  . HLD (hyperlipidemia)   . Hypertension   . Myocardial infarction ([place]) 1999  . OSA on CPAP   . PAD (peripheral artery disease) ([place])   .  PAF (paroxysmal atrial fibrillation) ([place])    a. 09/2013 post-op b. 01/2015  . Peripheral neuropathy   . Pneumonia   . Shortness of breath    when walking  . Wears glasses     Medications:  Scheduled:  . amiodarone  200 mg Oral Daily  . aspirin  81 mg Oral Daily  . atorvastatin  80 mg Oral Daily  . buPROPion  150 mg Oral Daily  . Chlorhexidine Gluconate Cloth  6 each Topical Daily  . digoxin  0.125 mg Oral Daily  . ezetimibe  10 mg Oral Daily  . furosemide  40 mg Oral Daily  . insulin aspart  0-9 Units Subcutaneous Q4H  . LORazepam  0.5 mg Intravenous Once  . losartan  25 mg Oral Daily  . oxyCODONE  10 mg Oral Q12H  . pantoprazole  40 mg Oral Daily  . pramipexole  0.25 mg Oral QHS  . sertraline  150 mg Oral QHS  . sodium chloride flush  3 mL Intravenous Q12H  . spironolactone  25 mg Oral Daily     Assessment: 39 yof s/p aortobifemoral bypass and impella 5.5. 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 Pt on heparin for a fib.   Heparin level remains therapeutic at 0.46 on 1100 units/hr of IV heparin. RN reports large hematoma noted to right inner forearm. 10cm x 8cm, warm to touch. Bruising previously present but hardened hematoma is a new observation.  Hg did decrease from 10.4 to 9.6.   Goal of Therapy:  Heparin level 0.3-0.5 Monitor platelets by anticoagulation protocol: Yes   Plan:  Will reduce IV heparin infusion at 1050 units/hr and await decision from MD/PA on if heparin needs to be stopped. Obtain 6 hr HL Monitor daily CBC, heparin level, and for s/sx of bleeding Switch to DOAC when okay per VSS. Planning to do so closer to discharge  Sherren Kerns, PharmD PGY1 [place] Resident Please refer to [facility] for unit-specific pharmacist

## 2019-07-07 NOTE — TOC Progression Note (Signed)
Transition of Care [facility]) - Progression Note    Patient Details  Name: PRIANNA SARNOFF MRN: [id number] Date of Birth: [birth year]  Transition of [facility]) CM/SW [place], [place] Phone Number: 07/07/2019, 12:05 PM  Clinical Narrative:     CSW visit with the patient and her sister,Anglea at bedside. Patient was sleep. CSW informed no current bed offers but will provide offers when available.   CSW will continue to follow and assist with discharge planning.  Thurmond Butts, MSW, [place] Clinical Social Worker   Expected Discharge Plan: [place] Barriers to Discharge: Continued Medical Work up  Expected Discharge Plan and Services Expected Discharge Plan: [place] In-house Referral: Clinical Social Work                                             Social Determinants of Health (SDOH) Interventions    Readmission Risk Interventions No flowsheet data found.

## 2019-07-07 NOTE — Progress Notes (Signed)
ANTICOAGULATION CONSULT NOTE - Follow Up Consult  Pharmacy Consult for heparin Indication: atrial fibrillation   Patient Measurements: Height: 5\' 1"  (154.9 cm) Weight: 67.1 kg (147 lb 14.9 oz) IBW/kg (Calculated) : 47.8 Heparin Dosing Weight: 73 kg   Vital Signs: Temp: 98 F (36.7 C) (05/02 1613) Temp Source: Oral (05/02 1613) BP: 97/49 (05/02 1615) Pulse Rate: 63 (05/02 1615)  Labs: Recent Labs    07/05/19 0230 07/05/19 0230 07/06/19 0235 07/06/19 1036 07/07/19 0416 07/07/19 1039 07/07/19 1752  HGB 10.2*   < > 10.4*  --  9.6*  --   --   HCT 32.7*  --  33.8*  --  31.2*  --   --   PLT 220  --  231  --  236  --   --   HEPARINUNFRC 0.40   < > 0.28*   < > 0.25* 0.46 0.39  CREATININE 0.96  --  0.96  --  1.04*  --   --    < > = values in this interval not displayed.    Estimated Creatinine Clearance: 46 mL/min (A) (by C-G formula based on SCr of 1.04 mg/dL (H)).   Assessment: 56 yof s/p aortobifemoral bypass and impella 5.5. Impella removed 4/23. 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  Pt on heparin for a fib (CHADS2VASc = 5).   Heparin level remains therapeutic at 0.39 on 1050 units/hr. Per RN, large hematoma on right inner forearm (10cm x 8cm) has been stable today. RN agreed to call pharmacy if notices it getting larger.    Goal of Therapy:  Heparin level 0.3-0.5 Monitor platelets by anticoagulation protocol: Yes   Plan:  Continue IV heparin infusion at 1050 units/hr   Monitor daily CBC, heparin level, and for s/sx of bleeding Switch to DOAC when okay per VSS. Planning to do so closer to discharge   Benetta Spar, PharmD, BCPS, [facility] Clinical Pharmacist  Please check AMION for all [place] phone numbers After 10:00 PM, call [place] [phone]

## 2019-07-07 NOTE — Progress Notes (Signed)
ANTICOAGULATION CONSULT NOTE - Follow Up Consult  Pharmacy Consult for heparin Indication: atrial fibrillation  Labs: Recent Labs    07/05/19 0230 07/05/19 0230 07/06/19 0235 07/06/19 0235 07/06/19 1036 07/06/19 1602 07/07/19 0416  HGB 10.2*   < > 10.4*  --   --   --  9.6*  HCT 32.7*  --  33.8*  --   --   --  31.2*  PLT 220  --  231  --   --   --  236  HEPARINUNFRC 0.40   < > 0.28*   < > 0.37 0.41 0.25*  CREATININE 0.96  --  0.96  --   --   --   --    < > = values in this interval not displayed.    Assessment: 69yo female subtherapeutic on heparin after two levels at goal; no gtt issues or signs of bleeding per RN.  Goal of Therapy:  Heparin level 0.3-0.5 units/ml   Plan:  Will increase heparin gtt by ~1 units/kg/hr to 1100 units/hr and check level in 6 hours.    Wynona Neat, PharmD, BCPS  07/07/2019,5:32 AM

## 2019-07-07 NOTE — Plan of Care (Signed)
Continue to monitor

## 2019-07-07 NOTE — TOC Progression Note (Signed)
Transition of Care [facility]) - Progression Note    Patient Details  Name: Wendy [name] MRN: [id number] Date of Birth: 07/11/1950  Transition of Care [facility]) CM/SW [place], [place] Phone Number: 07/07/2019, 10:38 AM  Clinical Narrative:     Patient has no bed offers at this time. CSW will provide bed offers when available.  Thurmond Butts, MSW, [place] Clinical Social Worker   Expected Discharge Plan: [place] Barriers to Discharge: Continued Medical Work up  Expected Discharge Plan and Services Expected Discharge Plan: [place] In-house Referral: Clinical Social Work                                             Social Determinants of Health (SDOH) Interventions    Readmission Risk Interventions No flowsheet data found.

## 2019-07-07 NOTE — NC FL2 (Signed)
[place] LEVEL OF CARE SCREENING TOOL     IDENTIFICATION  Patient Name: Wendy [name] Birthdate: 13-Mar-1950 Sex: female Admission Date (Current Location): 06/18/2019  [facility] and [place] Number:  Herbalist and Address:  [facility], [place] [address], [place], [place] [address]      Provider Number: [id number]  Attending Physician Name and Address:  Cain, Bantam Name and Phone Number:       Current Level of Care:   Recommended Level of Care: [place] Prior Approval Number:    Date Approved/Denied:   PASRR Number: [id number]  Discharge Plan: SNF    Current Diagnoses: Patient Active Problem List   Diagnosis Date Noted  . Cardiogenic shock ([place])   . 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 Coronary artery disease of native artery of native heart with stable angina pectoris ([place])   . Infection of right prosthetic hip joint ([place]) 08/16/2017  . Acute combined systolic and diastolic heart failure ([place]) 07/26/2016  . Troponin level elevated 07/26/2016  . Chronic anticoagulation 07/22/2016  . CAD S/P multiple PCIs-last one 2016 07/22/2016  . NSTEMI (non-ST elevated myocardial infarction) ([place]) 07/22/2016  . Primary osteoarthritis of right hip 06/27/2016  . Avascular necrosis of hip, right ([place]) 06/27/2016  . PVD (peripheral vascular disease) ([place])   . Essential (primary) hypertension 01/14/2015  . Coronary artery disease involving native coronary artery of native heart without angina pectoris 10/06/2014  . Acute angina ([place]) 05/12/2014  . Epigastric pain   . Chest pain with high risk of acute coronary syndrome 10/21/2013  . Acute respiratory failure ([place]) 09/20/2013  . Nonspecific abnormal results of liver function study 09/17/2013  . Cholecystitis, acute 09/15/2013  . PAD (peripheral artery disease) ([place])   . COPD (chronic obstructive pulmonary disease) ([place])   . Peripheral  neuropathy   . Chronic back pain   . Sleep apnea   . Anxiety   . GERD (gastroesophageal reflux disease)   . 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 Hypertension 07/20/2013  . HLD (hyperlipidemia) 07/20/2013  . Unstable angina ([place]) 07/19/2013  . Hx of CABG 07/19/2013    Orientation RESPIRATION BLADDER Height & Weight     Self, Time, Situation, Place  Normal Continent Weight: 147 lb 14.9 oz (67.1 kg) Height:  5\' 1"  (154.9 cm)  BEHAVIORAL SYMPTOMS/MOOD NEUROLOGICAL BOWEL NUTRITION STATUS      Continent Diet(please see discharge summary)  AMBULATORY STATUS COMMUNICATION OF NEEDS Skin     Verbally Surgical wounds(closed incision abdomen,closed incision right & left groin, closed incision right chest, closed incision right axilla, closed incision arm left)                       Personal Care Assistance Level of Assistance  Bathing, Dressing, Feeding Bathing Assistance: Limited assistance Feeding assistance: Independent Dressing Assistance: Limited assistance     Functional Limitations Info  Sight, Hearing, Speech Sight Info: Adequate Hearing Info: Adequate Speech Info: Adequate    SPECIAL CARE FACTORS FREQUENCY  PT (By licensed PT), OT (By licensed OT)     PT Frequency: 3x per week OT Frequency: 3x per week            Contractures Contractures Info: Not present    Additional Factors Info  Code Status, Allergies, Isolation Precautions Code Status Info: FULL Allergies Info: Benadryl,Penicillins,Amoxicillin,adhesive tape,Cyclobenzaprine,Hydrocodone     Isolation Precautions Info: MRSA     Current Medications (07/07/2019):  This is the current hospital active medication list Current Facility-Administered Medications  Medication Dose Route Frequency Provider Last Rate Last Admin  . 0.9 %  sodium chloride infusion  250 mL Intravenous PRN Dagoberto Ligas, PA-C      . acetaminophen (TYLENOL) tablet 325-650 mg  325-650 mg Oral Q4H PRN Dagoberto Ligas, PA-C   650 mg at  07/03/19 1733   Or  . acetaminophen (TYLENOL) suppository 325-650 mg  325-650 mg Rectal Q4H PRN Dagoberto Ligas, PA-C      . ALPRAZolam Duanne Moron) tablet 0.5 mg  0.5 mg Oral TID PRN Waynetta Sandy, MD   0.5 mg at 07/07/19 1010  . alum & mag hydroxide-simeth (MAALOX/MYLANTA) 200-200-20 MG/5ML suspension 15-30 mL  15-30 mL Oral Q2H PRN Dagoberto Ligas, PA-C      . amiodarone (PACERONE) tablet 200 mg  200 mg Oral Daily Larey Dresser, MD   200 mg at 07/07/19 1006  . aspirin chewable tablet 81 mg  81 mg Oral Daily Dagoberto Ligas, PA-C   81 mg at 07/07/19 1006  . atorvastatin (LIPITOR) tablet 80 mg  80 mg Oral Daily Dagoberto Ligas, PA-C   80 mg at 07/07/19 1005  . buPROPion (WELLBUTRIN XL) 24 hr tablet 150 mg  150 mg Oral Daily Dagoberto Ligas, PA-C   150 mg at 07/07/19 1006  . Chlorhexidine Gluconate Cloth 2 % PADS 6 each  6 each Topical Daily Dagoberto Ligas, PA-C   6 each at 07/07/19 1008  . digoxin (LANOXIN) tablet 0.125 mg  0.125 mg Oral Daily Dagoberto Ligas, PA-C   0.125 mg at 07/07/19 1007  . diphenhydrAMINE (BENADRYL) injection 12.5 mg  12.5 mg Intravenous Q6H PRN Dagoberto Ligas, PA-C       Or  . diphenhydrAMINE (BENADRYL) 12.5 MG/5ML elixir 12.5 mg  12.5 mg Oral Q6H PRN Dagoberto Ligas, PA-C      . docusate sodium (COLACE) capsule 100 mg  100 mg Oral Daily PRN Dagoberto Ligas, PA-C      . ezetimibe (ZETIA) tablet 10 mg  10 mg Oral Daily Dagoberto Ligas, PA-C   10 mg at 07/07/19 1005  . fentaNYL (SUBLIMAZE) injection 25-50 mcg  25-50 mcg Intravenous Q2H PRN Dagoberto Ligas, PA-C   25 mcg at 07/07/19 0138  . furosemide (LASIX) tablet 40 mg  40 mg Oral Daily Dagoberto Ligas, PA-C   40 mg at 07/07/19 1006  . gabapentin (NEURONTIN) capsule 300 mg  300 mg Oral TID PRN Dagoberto Ligas, PA-C   300 mg at 07/04/19 2024  . guaiFENesin-dextromethorphan (ROBITUSSIN DM) 100-10 MG/5ML syrup 15 mL  15 mL Oral Q4H PRN Dagoberto Ligas, PA-C      . heparin ADULT infusion 100  units/mL (25000 units/220mL sodium chloride 0.45%)  1,050 Units/hr Intravenous Continuous Sherren Kerns D, RPH 10.5 mL/hr at 07/07/19 1203 1,050 Units/hr at 07/07/19 1203  . insulin aspart (novoLOG) injection 0-9 Units  0-9 Units Subcutaneous Q4H Dagoberto Ligas, PA-C   1 Units at 07/07/19 1203  . LORazepam (ATIVAN) injection 0.5 mg  0.5 mg Intravenous Once Dagoberto Ligas, PA-C      . losartan (COZAAR) tablet 25 mg  25 mg Oral Daily Larey Dresser, MD   25 mg at 07/07/19 1006  . metoprolol tartrate (LOPRESSOR) injection 2-5 mg  2-5 mg Intravenous Q2H PRN Dagoberto Ligas, PA-C      . nitroGLYCERIN (NITROSTAT) SL tablet 0.4 mg  0.4 mg Sublingual Q5 min PRN Dagoberto Ligas, PA-C   0.4 mg at 06/29/19 2344  .  ondansetron (ZOFRAN) injection 4 mg  4 mg Intravenous Q6H PRN Dagoberto Ligas, PA-C   4 mg at 07/05/19 0501  . oxyCODONE (OXYCONTIN) 12 hr tablet 10 mg  10 mg Oral Q12H Waynetta Sandy, MD   10 mg at 07/07/19 1006  . pantoprazole (PROTONIX) EC tablet 40 mg  40 mg Oral Daily Dagoberto Ligas, PA-C   40 mg at 07/07/19 1006  . phenol (CHLORASEPTIC) mouth spray 1 spray  1 spray Mouth/Throat PRN Dagoberto Ligas, PA-C      . polyethylene glycol (MIRALAX / GLYCOLAX) packet 17 g  17 g Per Tube Daily PRN Dagoberto Ligas, PA-C      . pramipexole (MIRAPEX) tablet 0.25 mg  0.25 mg Oral QHS Dagoberto Ligas, PA-C   0.25 mg at 07/06/19 2030  . sertraline (ZOLOFT) tablet 150 mg  150 mg Oral QHS Dagoberto Ligas, PA-C   150 mg at 07/06/19 2031  . sodium chloride flush (NS) 0.9 % injection 3 mL  3 mL Intravenous Q12H Dagoberto Ligas, PA-C   3 mL at 07/07/19 1008  . spironolactone (ALDACTONE) tablet 25 mg  25 mg Oral Daily Dagoberto Ligas, PA-C   25 mg at 07/07/19 1010     Discharge Medications: Please see discharge summary for a list of discharge medications.  Relevant Imaging Results:  Relevant Lab Results:   Additional Information [id number]  Vinie Sill, LCSWA

## 2019-07-07 NOTE — Progress Notes (Addendum)
Large hematoma noted to right inner forearm. 10cm x 8cm, warm to touch. Bruising previously noted but hardened hematoma is a new observation. Extremity elevated and ice applied. PA paged at this time. Patient currently on heparin so pharmacist notified also. Will continue to monitor.  1220: MD paged. Per MD hold pressure, 20 minutes  1230 Pressure applied for 20 minutes. Hematoma decreased in size. Will continue to monitor  12050 MD at bedside.   1600 Pressure applied to hematoma for 20 minutes. Size decreased. Radial and brachial pulses palpable.

## 2019-07-08 DIAGNOSIS — R627 Adult failure to thrive: Secondary | ICD-10-CM | POA: Diagnosis not present

## 2019-07-08 DIAGNOSIS — I5022 Chronic systolic (congestive) heart failure: Secondary | ICD-10-CM | POA: Diagnosis not present

## 2019-07-08 LAB — GLUCOSE, CAPILLARY
Glucose-Capillary: 71 mg/dL (ref 70–99)
Glucose-Capillary: 79 mg/dL (ref 70–99)
Glucose-Capillary: 79 mg/dL (ref 70–99)
Glucose-Capillary: 88 mg/dL (ref 70–99)
Glucose-Capillary: 96 mg/dL (ref 70–99)
Glucose-Capillary: 96 mg/dL (ref 70–99)
Glucose-Capillary: 98 mg/dL (ref 70–99)
Glucose-Capillary: 99 mg/dL (ref 70–99)

## 2019-07-08 LAB — CBC
HCT: 33.8 % — ABNORMAL LOW (ref 36.0–46.0)
Hemoglobin: 10.3 g/dL — ABNORMAL LOW (ref 12.0–15.0)
MCH: 29.1 pg (ref 26.0–34.0)
MCHC: 30.5 g/dL (ref 30.0–36.0)
MCV: 95.5 fL (ref 80.0–100.0)
Platelets: 239 10*3/uL (ref 150–400)
RBC: 3.54 MIL/uL — ABNORMAL LOW (ref 3.87–5.11)
RDW: 16.6 % — ABNORMAL HIGH (ref 11.5–15.5)
WBC: 5.2 10*3/uL (ref 4.0–10.5)
nRBC: 0 % (ref 0.0–0.2)

## 2019-07-08 LAB — URINALYSIS, ROUTINE W REFLEX MICROSCOPIC
Bilirubin Urine: NEGATIVE
Glucose, UA: NEGATIVE mg/dL
Ketones, ur: NEGATIVE mg/dL
Nitrite: NEGATIVE
Protein, ur: NEGATIVE mg/dL
Specific Gravity, Urine: 1.005 (ref 1.005–1.030)
pH: 9 — ABNORMAL HIGH (ref 5.0–8.0)

## 2019-07-08 LAB — BASIC METABOLIC PANEL
Anion gap: 11 (ref 5–15)
BUN: 12 mg/dL (ref 8–23)
CO2: 31 mmol/L (ref 22–32)
Calcium: 9 mg/dL (ref 8.9–10.3)
Chloride: 96 mmol/L — ABNORMAL LOW (ref 98–111)
Creatinine, Ser: 1.03 mg/dL — ABNORMAL HIGH (ref 0.44–1.00)
GFR calc Af Amer: >60 mL/min (ref >60)
GFR calc non Af Amer: 56 mL/min — ABNORMAL LOW (ref >60)
Glucose, Bld: 85 mg/dL (ref 70–99)
Potassium: 4.5 mmol/L (ref 3.5–5.1)
Sodium: 138 mmol/L (ref 135–145)

## 2019-07-08 LAB — MAGNESIUM: Magnesium: 1.7 mg/dL (ref 1.7–2.4)

## 2019-07-08 LAB — HEPATIC FUNCTION PANEL
ALT: 15 U/L (ref 0–44)
AST: 20 U/L (ref 15–41)
Albumin: 2.9 g/dL — ABNORMAL LOW (ref 3.5–5.0)
Alkaline Phosphatase: 99 U/L (ref 38–126)
Bilirubin, Direct: 0.2 mg/dL (ref 0.0–0.2)
Indirect Bilirubin: 1 mg/dL — ABNORMAL HIGH (ref 0.3–0.9)
Total Bilirubin: 1.2 mg/dL (ref 0.3–1.2)
Total Protein: 6.6 g/dL (ref 6.5–8.1)

## 2019-07-08 LAB — AMMONIA: Ammonia: 30 umol/L (ref 9–35)

## 2019-07-08 LAB — HEPARIN LEVEL (UNFRACTIONATED): Heparin Unfractionated: 0.41 IU/mL (ref 0.30–0.70)

## 2019-07-08 MED ORDER — APIXABAN 5 MG PO TABS
5.0000 mg | ORAL_TABLET | Freq: Two times a day (BID) | ORAL | Status: DC
  Administered 2019-07-08 – 2019-07-12 (×9): 5 mg via ORAL
  Filled 2019-07-08 (×9): qty 1

## 2019-07-08 MED ORDER — MAGNESIUM SULFATE 2 GM/50ML IV SOLN
2.0000 g | Freq: Once | INTRAVENOUS | Status: AC
  Administered 2019-07-08: 2 g via INTRAVENOUS
  Filled 2019-07-08: qty 50

## 2019-07-08 MED ORDER — ADULT MULTIVITAMIN W/MINERALS CH
1.0000 | ORAL_TABLET | Freq: Every day | ORAL | Status: DC
  Administered 2019-07-08 – 2019-07-12 (×5): 1 via ORAL
  Filled 2019-07-08 (×5): qty 1

## 2019-07-08 NOTE — Progress Notes (Signed)
Patient ID: Wendy [name], female   DOB: Mar 23, 1950, 69 y.o.   MRN: [id number]    Advanced Heart Failure Rounding Note  PCP-Cardiologist: [name], MD   Subjective:    - 4/13 aortobifemoral bypass - Impella 5.5 placement 4/19 for cardiogenic shock.  - Extubated 4/20 - LHC 4/22  (left brachial access): All grafts occluded (known), totally occluded RCA, OM1, OM3, 80% dLCx, 60% pLAD.  No change, no intervention.  - Impella 5.5 removed 4/23 - 4/25 S/P Emergent Repair of Left Brachial Artery Pseudoaneurysm - 4/27 Transfused 2 unit PRBCs  Stable hemodynamically but still lethargic and eating poorly.  Says she feels "bad" but will not elaborate. No chest pain or dyspnea.   No fever, WBCs normal.   Objective:   Weight Range: 67.4 kg Body mass index is 28.08 kg/m.   Vital Signs:   Temp:  [97.5 F (36.4 C)-98.4 F (36.9 C)] 98.1 F (36.7 C) (05/03 0801) Pulse Rate:  [60-65] 65 (05/03 0801) Resp:  [12-20] 14 (05/03 0801) BP: (95-111)/(44-78) 111/51 (05/03 0801) SpO2:  [92 %-97 %] 92 % (05/03 0801) Weight:  [67.4 kg] 67.4 kg (05/03 0400) Last BM Date: 07/07/19  Weight change: Filed Weights   07/03/19 0600 07/07/19 0410 07/08/19 0400  Weight: 70.5 kg 67.1 kg 67.4 kg    Intake/Output:   Intake/Output Summary (Last 24 hours) at 07/08/2019 0958 Last data filed at 07/08/2019 0700 Gross per 24 hour  Intake 517.26 ml  Output 1880 ml  Net -1362.74 ml      Physical Exam   General: NAD Neck: No JVD, no thyromegaly or thyroid nodule.  Lungs: Clear to auscultation bilaterally with normal respiratory effort. CV: Nondisplaced PMI.  Heart regular S1/S2, +S4, no murmur.  No peripheral edema.   Abdomen: Soft, nontender, no hepatosplenomegaly, no distention.  Skin: Intact without lesions or rashes.  Neurologic: Alert and oriented x 3.  Psych: Normal affect. Extremities: No clubbing or cyanosis. Ecchymosis left arm.  HEENT: Normal.    Telemetry   NSR 60s (personally  reviewed)  Labs    CBC Recent Labs    07/07/19 0416 07/08/19 0303  WBC 4.5 5.2  HGB 9.6* 10.3*  HCT 31.2* 33.8*  MCV 94.8 95.5  PLT 236 [id number]   Basic Metabolic Panel Recent Labs    07/07/19 0416 07/08/19 0303  NA 137 138  K 4.2 4.5  CL 95* 96*  CO2 34* 31  GLUCOSE 114* 85  BUN 14 12  CREATININE 1.04* 1.03*  CALCIUM 8.8* 9.0  MG 1.8 1.7   Liver Function Tests No results for input(s): AST, ALT, ALKPHOS, BILITOT, PROT, ALBUMIN in the last 72 hours. No results for input(s): LIPASE, AMYLASE in the last 72 hours. Cardiac Enzymes No results for input(s): CKTOTAL, CKMB, CKMBINDEX, TROPONINI in the last 72 hours.  BNP: BNP (last 3 results) Recent Labs    06/19/19 1239  BNP 366.7*    ProBNP (last 3 results) No results for input(s): PROBNP in the last 8760 hours.   D-Dimer No results for input(s): DDIMER in the last 72 hours. Hemoglobin A1C No results for input(s): HGBA1C in the last 72 hours. Fasting Lipid Panel No results for input(s): CHOL, HDL, LDLCALC, TRIG, CHOLHDL, LDLDIRECT in the last 72 hours. Thyroid Function Tests No results for input(s): TSH, T4TOTAL, T3FREE, THYROIDAB in the last 72 hours.  Invalid input(s): FREET3  Other results:   Imaging    No results found.   Medications:     Scheduled Medications: .  amiodarone  200 mg Oral Daily  . aspirin  81 mg Oral Daily  . atorvastatin  80 mg Oral Daily  . buPROPion  150 mg Oral Daily  . Chlorhexidine Gluconate Cloth  6 each Topical Daily  . digoxin  0.125 mg Oral Daily  . ezetimibe  10 mg Oral Daily  . furosemide  40 mg Oral Daily  . insulin aspart  0-9 Units Subcutaneous Q4H  . LORazepam  0.5 mg Intravenous Once  . losartan  25 mg Oral Daily  . oxyCODONE  10 mg Oral Q12H  . pantoprazole  40 mg Oral Daily  . pramipexole  0.25 mg Oral QHS  . sertraline  150 mg Oral QHS  . sodium chloride flush  3 mL Intravenous Q12H  . spironolactone  25 mg Oral Daily    Infusions: . sodium chloride     . heparin 1,050 Units/hr (07/08/19 0046)  . magnesium sulfate bolus IVPB      PRN Medications: sodium chloride, acetaminophen **OR** acetaminophen, ALPRAZolam, alum & mag hydroxide-simeth, diphenhydrAMINE **OR** diphenhydrAMINE, docusate sodium, fentaNYL (SUBLIMAZE) injection, gabapentin, guaiFENesin-dextromethorphan, metoprolol tartrate, nitroGLYCERIN, ondansetron, phenol, polyethylene glycol   Assessment/Plan   1. Cardiogenic shock: Progressive worsening 4/19 culminating in PEA arrest.  Lactate 8.1.  She had episodes of hypotension and atrial fibrillation/RVR earlier in her post-op course as well.  Symptoms were dyspnea and anxiety no chest pain.  No STEMI on ECG but concern for possible coronary event based on history.  Initial troponin 1802 => 2957.  Initially on NE, epi then Impella 5.5 placed on 4/19.  Now MAP is stable and off pressors. Impella 5.5 out 4/23.  - Resolved.  2. Acute systolic CHF: Suspect ischemic cardiomyopathy.  Echo 06/24/19 EF 20-25%, diffuse hypokinesis, preserved RV function.  Pre-op echo 06/17/19 EF 60%.  HS-TnI 1802 => 2957, elevated but not markedly high.  [place] 4/22 showed unchanged coronary anatomy, suspect patient got ischemic with hypotension.  Impella 5.5 out 4/23.  She is generally lethargic with failure to thrive but creatinine stable, BP stable, and she does not appear volume overloaded.  - Off entresto 4/25 due hypotension.  - Tolerating Losartan, continue 25 mg daily - Continue digoxin 0.125, check level in am.  - Continue spironolactone 25 mg daily  - Coreg 3.125 added but bradycardic in the upper 40s and low SBP in 90s. Symptomatic w/ fatigue. Therefore, Coreg stopped.     - Continue Lasix 40 mg po daily.   - If no improvement, can repeat RHC to look for low output though she seems to be stable from a cardiac standpoint to me.  3. AKI: Creatinine as high as 2.86, now normalized.    4. CAD: LHC in 3/21 showed occluded SVG-RCA and SVG-OM and atretic  LIMA-LAD.  The LAD was patent with 60% mid vessel stenosis, the RCA was occluded with collaterals, and OM1 and OM2 were occluded.  No intervention.  Surprisingly, EF reported as normal pre-op.  No chest pain but had dyspnea and anxiety today.  ECG did not show STEMI, showed fairly stable lateral TWIs.  HS-TnI to 2957. [place] 4/22 showed unchanged coronary anatomy, suspect she was ischemic with hypotension.  No interventional target.   - Continue statin, ASA for now. Will eventually stop ASA as she will need long-term oral anticoagulation (Eliquis) unless vascular wants an antiplatelet agent post-aortobifemoral bypass.  5. PAD: S/p aortobifemoral bypass on 4/13.  Surgical site is stable, she has had palpable PT pulses.  6. Acute hypoxemic respiratory failure:  Intubated with PEA arrest. Resolved, extubated.  Chest still sore from CPR.  - Resolved 7. Atrial fibrillation: Paroxysmal.  She has has been in and out of atrial fibrillation post-op.  Has had episodes of junctional rhythm also.  Maintaining NSR currently.   - Continue amio 200 mg once a day. - On heparin gtt => transition to Eliquis when ok per surgery (can we transition today?).  8. Thrombocytopenia: Post-Impella placement.  - Resolved.  9. Anemia, post-op: hgb 7.6 -> 7.8->7.5 -> 6.3 -> transfused 2 units -> 10 -> 10.3. Ecchymosis left brachial site.  10. Deconditioning - Continue PT/OT - Pt declined SNF. Will plan home health services   11. 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  12. Failure to thrive: Afebrile, normal WBCs, vitals stable, creatinine stable.  Unsure why she is feeling "bad." Eating poorly, does not like Ensure.  - Allow family to bring in milk shakes for her.  - Will check digoxin trough level in am.  - Send LFTs and NH3 level.  - Check UA.   Length of Stay: 20  Loralie Champagne, MD  07/08/2019, 9:58 AM  Advanced Heart Failure Team Pager [phone] (M-F; 7a -  4p)  Please contact [facility] for night-coverage after hours (4p -7a ) and weekends on [email or web address]

## 2019-07-08 NOTE — Progress Notes (Addendum)
Progress Note    07/08/2019 7:23 AM 8 Days Post-Op  Subjective:  Lethargic. Follows commands. Not eating. Nursing staff applied compression ACE wrap to right forearm.   Vitals:   07/08/19 0000 07/08/19 0400  BP: 105/74 (!) 106/49  Pulse: 65 62  Resp: 15 14  Temp: 97.6 F (36.4 C) 97.7 F (36.5 C)  SpO2: 93% 92%    Physical Exam: Cardiac:  RRR Lungs:  CTA bil Incisions:  Left UE, midline abd incision and bil groin incisions healing without signs of infection Extremities:  Palpable bil DP pulses.  Hematoma of right forearm with increasing edema and dusky erythema. Hand is warm with 5/5 grip strength. Palpable left ulnar pulse Abdomen:  Soft, active BS     CBC    Component Value Date/Time   WBC 5.2 07/08/2019 0303   RBC 3.54 (L) 07/08/2019 0303   HGB 10.3 (L) 07/08/2019 0303   HGB 13.4 06/04/2019 1036   HCT 33.8 (L) 07/08/2019 0303   HCT 41.7 06/04/2019 1036   PLT 239 07/08/2019 0303   PLT 205 06/04/2019 1036   MCV 95.5 07/08/2019 0303   MCV 86 06/04/2019 1036   MCH 29.1 07/08/2019 0303   MCHC 30.5 07/08/2019 0303   RDW 16.6 (H) 07/08/2019 0303   RDW 17.4 (H) 06/04/2019 1036   LYMPHSABS 0.6 (L) 07/03/2019 0325   MONOABS 0.3 07/03/2019 0325   EOSABS 0.1 07/03/2019 0325   BASOSABS 0.0 07/03/2019 0325    BMET    Component Value Date/Time   NA 138 07/08/2019 0303   NA 141 06/04/2019 1036   K 4.5 07/08/2019 0303   CL 96 (L) 07/08/2019 0303   CO2 31 07/08/2019 0303   GLUCOSE 85 07/08/2019 0303   BUN 12 07/08/2019 0303   BUN 14 06/04/2019 1036   CREATININE 1.03 (H) 07/08/2019 0303   CREATININE 0.96 08/13/2015 0939   CALCIUM 9.0 07/08/2019 0303   GFRNONAA 56 (L) 07/08/2019 0303   GFRAA >60 07/08/2019 0303     Intake/Output Summary (Last 24 hours) at 07/08/2019 0723 Last data filed at 07/08/2019 0700 Gross per 24 hour  Intake 517.26 ml  Output 1880 ml  Net -1362.74 ml    HOSPITAL MEDICATIONS Scheduled Meds: . amiodarone  200 mg Oral Daily  .  aspirin  81 mg Oral Daily  . atorvastatin  80 mg Oral Daily  . buPROPion  150 mg Oral Daily  . Chlorhexidine Gluconate Cloth  6 each Topical Daily  . digoxin  0.125 mg Oral Daily  . ezetimibe  10 mg Oral Daily  . furosemide  40 mg Oral Daily  . insulin aspart  0-9 Units Subcutaneous Q4H  . LORazepam  0.5 mg Intravenous Once  . losartan  25 mg Oral Daily  . oxyCODONE  10 mg Oral Q12H  . pantoprazole  40 mg Oral Daily  . pramipexole  0.25 mg Oral QHS  . sertraline  150 mg Oral QHS  . sodium chloride flush  3 mL Intravenous Q12H  . spironolactone  25 mg Oral Daily   Continuous Infusions: . sodium chloride    . heparin 1,050 Units/hr (07/08/19 0046)   PRN Meds:.sodium chloride, acetaminophen **OR** acetaminophen, ALPRAZolam, alum & mag hydroxide-simeth, diphenhydrAMINE **OR** diphenhydrAMINE, docusate sodium, fentaNYL (SUBLIMAZE) injection, gabapentin, guaiFENesin-dextromethorphan, metoprolol tartrate, nitroGLYCERIN, ondansetron, phenol, polyethylene glycol  Assessment:  69 y.o. female is s/p: Aortobifemoral bypass 4/13 with subsequent PEA arrest.  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  Right forearm hematoma. Afebrile,  no leukocytosis. Hemoglobin  10.3 today up from 9.6 yesterday. Remains on heparin infusion.  Significant deconditioning.  Lethargic, not eating. Electrolytes normal. Good UOP. CAD, hx a. fib. Goal is to transition to Eliquis. No SNF beds available  2-1/2 weeks Post-Op    Plan: Hemodynamically stable, but not progressing.  Dietary consult.  I don't think her right forearm is infected.  Will follow-up clinical assessment with Dr. Donzetta Matters today.  -DVT prophylaxis:  Heparin gtt   OK with Dr. Donzetta Matters to transition to Eliquis today  Risa Grill, PA-C [facility] [phone] 07/08/2019  7:23 AM   I have independently interviewed and examined patient and agree with PA assessment and plan above.   Karri Kallenbach C. Donzetta Matters,  MD [facility] [place] Office: [phone] Pager: [phone]

## 2019-07-08 NOTE — Progress Notes (Signed)
Patient care plan reviewed. Patient progressing. VS stable. MEWS green. BP 105/75 (84). EKG NSR on the monitor HR 65. RR 15 SpO2 93% on room air. Patient drowsy and lethargic today. Large hematoma on right forearm has not spread beyond marked barrier from prior shift. Area is warm and hard to the touch. Pharmacist continuing heparin at 10.5 but advised to notify them of any changes in the hematoma. Will continue to monitor. Adella Hare, RN

## 2019-07-08 NOTE — Progress Notes (Signed)
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 PMHx: L to R femorofemoral bypass. PMHx: COPD, CAD, HTN, past MI, PVDx, cardiac stents.    PT Comments    Attempted to see this AM and pt refused due to fatigued. Returned later and pt required max encouragement to participate. Only amb a few feet to bsc and then recliner. Very little energy. Very flat affect. Recommend palliative medicine consult.    Follow Up Recommendations  SNF;Supervision/Assistance - 24 hour     Equipment Recommendations  Other (comment)(to be assessed)    Recommendations for Other Services       Precautions / Restrictions Precautions Precautions: Fall Precaution Comments: watch O2    Mobility  Bed Mobility Overal bed mobility: Needs Assistance Bed Mobility: Supine to Sit     Supine to sit: Min assist     General bed mobility comments: Assist to elevate trunk into sitting  Transfers Overall transfer level: Needs assistance Equipment used: Rolling walker (2 wheeled) Transfers: Sit to/from [facility] Sit to Stand: Min assist Stand pivot transfers: Min assist       General transfer comment: Assist to bring hips up. Verbal cues for hand placement. Bed to [facility] with stand pivot  Ambulation/Gait Ambulation/Gait assistance: Min assist Gait Distance (Feet): 3 Feet Assistive device: Rolling walker (2 wheeled) Gait Pattern/deviations: Step-to pattern;Decreased step length - right;Decreased step length - left;Shuffle;Trunk flexed Gait velocity: decr Gait velocity interpretation: <1.31 ft/sec, indicative of household ambulator General Gait Details: Assist for balance and  support.   Stairs             Wheelchair Mobility    Modified Rankin (Stroke Patients Only)       Balance Overall balance assessment: Needs assistance Sitting-balance support: No upper extremity supported;Feet supported Sitting balance-Leahy Scale: Fair     Standing balance support: Bilateral upper extremity supported Standing balance-Leahy Scale: Poor Standing balance comment: walker and min guard for static standing                            Cognition Arousal/Alertness: Awake/alert Behavior During Therapy: Flat affect Overall Cognitive Status: Within Functional Limits for tasks assessed                                        Exercises      General Comments        Pertinent Vitals/Pain Pain Assessment: Faces Faces Pain Scale: Hurts little more Pain Location: LUE Pain Descriptors / Indicators: Grimacing;Guarding Pain Intervention(s): Limited activity within patient's tolerance;Monitored during session;Repositioned    Home Living                      Prior Function            PT Goals (current goals can now be found in the care plan section) Progress towards PT goals: Not progressing toward goals - comment    Frequency    Min 3X/week      PT Plan Discharge plan needs to be updated    Co-evaluation  AM-PAC PT "6 Clicks" Mobility   Outcome Measure  Help needed turning from your back to your side while in a flat bed without using bedrails?: None Help needed moving from lying on your back to sitting on the side of a flat bed without using bedrails?: A Little Help needed moving to and from a bed to a chair (including a wheelchair)?: A Little Help needed standing up from a chair using your arms (e.g., wheelchair or bedside chair)?: A Lot Help needed to walk in hospital room?: A Lot Help needed climbing 3-5 steps with a railing? : A Lot 6 Click Score: 16    End of Session   Activity  Tolerance: Patient limited by fatigue Patient left: in chair;with call bell/phone within reach Nurse Communication: Mobility status PT Visit Diagnosis: Other abnormalities of gait and mobility (R26.89);Muscle weakness (generalized) (M62.81)     Time: 1334-1400 PT Time Calculation (min) (ACUTE ONLY): 26 min  Charges:  $Therapeutic Activity: 23-37 mins                     [place] Pager [phone] Office [place] 07/08/2019, 2:24 PM

## 2019-07-08 NOTE — Progress Notes (Signed)
ANTICOAGULATION CONSULT NOTE - Follow Up Consult  Pharmacy Consult for heparin Indication: atrial fibrillation   Patient Measurements: Height: 5\' 1"  (154.9 cm) Weight: 67.4 kg (148 lb 9.4 oz) IBW/kg (Calculated) : 47.8 Heparin Dosing Weight: 73 kg   Vital Signs: Temp: 97.8 F (36.6 C) (05/03 1117) Temp Source: Oral (05/03 1117) BP: 121/53 (05/03 1117) Pulse Rate: 65 (05/03 0801)  Labs: Recent Labs    07/06/19 0235 07/06/19 1036 07/07/19 0416 07/07/19 0416 07/07/19 1039 07/07/19 1752 07/08/19 0303  HGB 10.4*  --  9.6*  --   --   --  10.3*  HCT 33.8*  --  31.2*  --   --   --  33.8*  PLT 231  --  236  --   --   --  239  HEPARINUNFRC 0.28*   < > 0.25*   < > 0.46 0.39 0.41  CREATININE 0.96  --  1.04*  --   --   --  1.03*   < > = values in this interval not displayed.    Estimated Creatinine Clearance: 46.5 mL/min (A) (by C-G formula based on SCr of 1.03 mg/dL (H)).   Assessment: 67 yof s/p aortobifemoral bypass and impella 5.5. Impella removed 4/23. 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  Pt on heparin for a fib (CHADS2VASc = 5).   Heparin level remains therapeutic at 0.41 on 1050 units/hr. Per RN, large hematoma on right inner forearm (10cm x 8cm) has been stable.   New orders to change back to apixaban  Goal of Therapy:  Heparin level 0.3-0.5 Monitor platelets by anticoagulation protocol: Yes   Plan:  Restart apixaban 5mg  bid Follow up with cbc in am  Erin Hearing PharmD., BCPS Clinical Pharmacist 07/08/2019 12:27 PM

## 2019-07-08 NOTE — Progress Notes (Signed)
Initial Nutrition Assessment  DOCUMENTATION CODES:   Not applicable  INTERVENTION:    Magic cup TID with meals, each supplement provides 290 kcal and 9 grams of protein  MVI with minerals  NUTRITION DIAGNOSIS:   Moderate Malnutrition related to chronic illness(COPD/CHF) as evidenced by moderate muscle depletion, energy intake < 75% for > or equal to 1 month.  GOAL:   Patient will meet greater than or equal to 90% of their needs  MONITOR:   PO intake, Supplement acceptance, Weight trends, Labs, I & O's  REASON FOR ASSESSMENT:   Consult Assessment of nutrition requirement/status  ASSESSMENT:   Patient with PMH significant for PAD, CAD, NSTEMI, s/p CABG x3 with PCI, PAF, HTN, CHF, COPD, GERD, peripheral neuropathy, and polysubstance abuse. Presents this admission with scheduled aortobifemoral bypass.   4/13- s/p aortobifemoral bypass  4/19- s/p PEA arrest, impella placement  4/20- extubated 4/22- s/p LHC 4/23- s/p impella removal 4/25- s/p emergent repair of L brachial artery pseudoaneurysm  Pt endorses a decline intake over the last couple months due to unknown reason. States during this time she consumed one meal daily that consist of meat, vegetable, and a grain. Does not like supplementation. Meal completions charted as 0-100% (50% average). Looks to skip some meals. Pt drinking Cook[employer] milkshake during RD visit. Meal tray looked untouched. Encourage intake and discussed the importance of protein intake for preservation of lean body mass. Pt refuses Ensure or Boost.   Pt reports a UBW 180 lb and a wt loss of 40 lb in an unknown time frame. Records indicate pt weighed 165 lb on 11/28/2018 and 148 lb this admission (10.3% wt loss in 8 months, insignificant for time frame).   I/O: -12,330 ml since 4/19 UOP: 1,880 ml x 24 hrs   Medications: 40 mg once daily, SS novolog, aldactone  Labs: CBG 71-100  NUTRITION - FOCUSED PHYSICAL EXAM:    Most Recent Value  Orbital  Region  No depletion  Upper Arm Region  Mild depletion  Thoracic and Lumbar Region  Unable to assess  Buccal Region  No depletion  Temple Region  Mild depletion  Clavicle Bone Region  Mild depletion  Clavicle and Acromion Bone Region  Mild depletion  Scapular Bone Region  Mild depletion  Dorsal Hand  No depletion  Patellar Region  Moderate depletion  Anterior Thigh Region  Moderate depletion  Posterior Calf Region  Moderate depletion  Edema (RD Assessment)  Mild  Hair  Reviewed  Eyes  Reviewed  Mouth  Reviewed  Skin  Reviewed  Nails  Reviewed     Diet Order:   Diet Order            Diet Heart Room service appropriate? No; Fluid consistency: Thin  Diet effective now              EDUCATION NEEDS:   Not appropriate for education at this time  Skin:  Skin Assessment: Skin Integrity Issues: Skin Integrity Issues:: Incisions Incisions: abdomen, L/R groin, R chest, R axilla, L arm  Last BM:  5/2  Height:   Ht Readings from Last 1 Encounters:  06/26/19 5\' 1"  (1.549 m)    Weight:   Wt Readings from Last 1 Encounters:  07/08/19 67.4 kg    BMI:  Body mass index is 28.08 kg/m.  Estimated Nutritional Needs:   Kcal:  1650-1850 kcal  Protein:  80-100 grams  Fluid:  >/= 1.6 L/day   Mariana Single RD, LDN Clinical Nutrition Pager listed in  AMION

## 2019-07-09 DIAGNOSIS — I5022 Chronic systolic (congestive) heart failure: Secondary | ICD-10-CM | POA: Diagnosis not present

## 2019-07-09 LAB — GLUCOSE, CAPILLARY
Glucose-Capillary: 155 mg/dL — ABNORMAL HIGH (ref 70–99)
Glucose-Capillary: 81 mg/dL (ref 70–99)
Glucose-Capillary: 83 mg/dL (ref 70–99)
Glucose-Capillary: 93 mg/dL (ref 70–99)
Glucose-Capillary: 93 mg/dL (ref 70–99)
Glucose-Capillary: 96 mg/dL (ref 70–99)

## 2019-07-09 LAB — BASIC METABOLIC PANEL
Anion gap: 12 (ref 5–15)
BUN: 14 mg/dL (ref 8–23)
CO2: 31 mmol/L (ref 22–32)
Calcium: 9.1 mg/dL (ref 8.9–10.3)
Chloride: 97 mmol/L — ABNORMAL LOW (ref 98–111)
Creatinine, Ser: 1.11 mg/dL — ABNORMAL HIGH (ref 0.44–1.00)
GFR calc Af Amer: 60 mL/min — ABNORMAL LOW (ref >60)
GFR calc non Af Amer: 51 mL/min — ABNORMAL LOW (ref >60)
Glucose, Bld: 81 mg/dL (ref 70–99)
Potassium: 4.3 mmol/L (ref 3.5–5.1)
Sodium: 140 mmol/L (ref 135–145)

## 2019-07-09 LAB — CBC
HCT: 35.7 % — ABNORMAL LOW (ref 36.0–46.0)
Hemoglobin: 11 g/dL — ABNORMAL LOW (ref 12.0–15.0)
MCH: 29.2 pg (ref 26.0–34.0)
MCHC: 30.8 g/dL (ref 30.0–36.0)
MCV: 94.7 fL (ref 80.0–100.0)
Platelets: 225 10*3/uL (ref 150–400)
RBC: 3.77 MIL/uL — ABNORMAL LOW (ref 3.87–5.11)
RDW: 16.7 % — ABNORMAL HIGH (ref 11.5–15.5)
WBC: 4.2 10*3/uL (ref 4.0–10.5)
nRBC: 0 % (ref 0.0–0.2)

## 2019-07-09 LAB — MAGNESIUM: Magnesium: 2 mg/dL (ref 1.7–2.4)

## 2019-07-09 LAB — DIGOXIN LEVEL: Digoxin Level: 1.2 ng/mL (ref 0.8–2.0)

## 2019-07-09 MED ORDER — SODIUM CHLORIDE 0.9 % IV SOLN
1.0000 g | INTRAVENOUS | Status: DC
  Administered 2019-07-09 – 2019-07-12 (×4): 1 g via INTRAVENOUS
  Filled 2019-07-09 (×4): qty 1

## 2019-07-09 MED ORDER — ATORVASTATIN CALCIUM 80 MG PO TABS
80.0000 mg | ORAL_TABLET | Freq: Every day | ORAL | Status: DC
  Administered 2019-07-09 – 2019-07-12 (×4): 80 mg via ORAL
  Filled 2019-07-09 (×3): qty 1

## 2019-07-09 MED ORDER — DIGOXIN 125 MCG PO TABS
0.0625 mg | ORAL_TABLET | Freq: Every day | ORAL | Status: DC
  Administered 2019-07-10: 0.0625 mg via ORAL
  Filled 2019-07-09: qty 1

## 2019-07-09 NOTE — Progress Notes (Addendum)
Patient ID: Wendy [name], female   DOB: [birth year], 69 y.o.   MRN: [id number] P    Advanced Heart Failure Rounding Note  PCP-Cardiologist: [name], MD   Subjective:    - 4/13 aortobifemoral bypass - Impella 5.5 placement 4/19 for cardiogenic shock.  - Extubated 4/20 - LHC 4/22  (left brachial access): All grafts occluded (known), totally occluded RCA, OM1, OM3, 80% dLCx, 60% pLAD.  No change, no intervention.  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 CX pending.   Complaining of back pain. Says she takes pain medication 3 times a day at home. No appetite.   Objective:   Weight Range: 61.8 kg Body mass index is 25.74 kg/m.   Vital Signs:   Temp:  [97.7 F (36.5 C)-98.1 F (36.7 C)] 98.1 F (36.7 C) (05/04 0400) Pulse Rate:  [64-69] 68 (05/04 0500) Resp:  [12-17] 13 (05/04 0500) BP: (105-125)/(46-65) 105/46 (05/04 0400) SpO2:  [92 %-100 %] 100 % (05/04 0500) Weight:  [61.8 kg] 61.8 kg (05/04 0500) Last BM Date: 07/07/19  Weight change: Filed Weights   07/07/19 0410 07/08/19 0400 07/09/19 0500  Weight: 67.1 kg 67.4 kg 61.8 kg    Intake/Output:   Intake/Output Summary (Last 24 hours) at 07/09/2019 0721 Last data filed at 07/08/2019 1919 Gross per 24 hour  Intake 165.58 ml  Output 1350 ml  Net -1184.42 ml      Physical Exam   General:  Appears weak.  No resp difficulty HEENT: normal Neck: supple. no JVD. Carotids 2+ bilat; no bruits. No lymphadenopathy or thryomegaly appreciated. Cor: PMI nondisplaced. Regular rate & rhythm. No rubs, gallops or murmurs. Lungs: clear Abdomen: soft, nontender, nondistended. No hepatosplenomegaly. No bruits or masses. Good bowel sounds. Extremities: no cyanosis, clubbing, rash, LUE ecchymotic.  Neuro: alert & orientedx3, cranial nerves grossly intact. moves all 4 extremities w/o  difficulty. Affect pleasant   Telemetry   NSR  60s personally reviewed.   Labs    CBC Recent Labs    07/08/19 0303 07/09/19 0323  WBC 5.2 4.2  HGB 10.3* 11.0*  HCT 33.8* 35.7*  MCV 95.5 94.7  PLT 239 [id number]   Basic Metabolic Panel Recent Labs    07/08/19 0303 07/09/19 0323  NA 138 140  K 4.5 4.3  CL 96* 97*  CO2 31 31  GLUCOSE 85 81  BUN 12 14  CREATININE 1.03* 1.11*  CALCIUM 9.0 9.1  MG 1.7 2.0   Liver Function Tests Recent Labs    07/08/19 1141  AST 20  ALT 15  ALKPHOS 99  BILITOT 1.2  PROT 6.6  ALBUMIN 2.9*   No results for input(s): LIPASE, AMYLASE in the last 72 hours. Cardiac Enzymes No results for input(s): CKTOTAL, CKMB, CKMBINDEX, TROPONINI in the last 72 hours.  BNP: BNP (last 3 results) Recent Labs    06/19/19 1239  BNP 366.7*    ProBNP (last 3 results) No results for input(s): PROBNP in the last 8760 hours.   D-Dimer No results for input(s): DDIMER in the last 72 hours. Hemoglobin A1C No results for input(s): HGBA1C in the last 72 hours. Fasting Lipid Panel No results for input(s): CHOL, HDL, LDLCALC, TRIG, CHOLHDL, LDLDIRECT in the last 72 hours. Thyroid Function Tests No results for input(s): TSH, T4TOTAL, T3FREE, THYROIDAB in the last 72 hours.  Invalid input(s): FREET3  Other results:  Imaging    No results found.   Medications:     Scheduled Medications: . amiodarone  200 mg Oral Daily  . apixaban  5 mg Oral BID  . aspirin  81 mg Oral Daily  . atorvastatin  80 mg Oral Daily  . buPROPion  150 mg Oral Daily  . Chlorhexidine Gluconate Cloth  6 each Topical Daily  . digoxin  0.125 mg Oral Daily  . ezetimibe  10 mg Oral Daily  . furosemide  40 mg Oral Daily  . insulin aspart  0-9 Units Subcutaneous Q4H  . LORazepam  0.5 mg Intravenous Once  . losartan  25 mg Oral Daily  . multivitamin with minerals  1 tablet Oral Daily  . oxyCODONE  10 mg Oral Q12H  . pantoprazole  40 mg Oral Daily  . pramipexole  0.25 mg  Oral QHS  . sertraline  150 mg Oral QHS  . sodium chloride flush  3 mL Intravenous Q12H  . spironolactone  25 mg Oral Daily    Infusions: . sodium chloride      PRN Medications: sodium chloride, acetaminophen **OR** acetaminophen, ALPRAZolam, alum & mag hydroxide-simeth, diphenhydrAMINE **OR** diphenhydrAMINE, docusate sodium, fentaNYL (SUBLIMAZE) injection, gabapentin, guaiFENesin-dextromethorphan, metoprolol tartrate, nitroGLYCERIN, ondansetron, phenol, polyethylene glycol   Assessment/Plan   1. Cardiogenic shock: Progressive worsening 4/19 culminating in PEA arrest.  Lactate 8.1.  She had episodes of hypotension and atrial fibrillation/RVR earlier in her post-op course as well.  Symptoms were dyspnea and anxiety no chest pain.  No STEMI on ECG but concern for possible coronary event based on history.  Initial troponin 1802 => 2957.  Initially on NE, epi then Impella 5.5 placed on 4/19.  Now MAP is stable and off pressors. Impella 5.5 out 4/23.  - Resolved.  2. Acute systolic CHF: Suspect ischemic cardiomyopathy.  Echo 06/24/19 EF 20-25%, diffuse hypokinesis, preserved RV function.  Pre-op echo 06/17/19 EF 60%.  HS-TnI 1802 => 2957, elevated but not markedly high.  [place] 4/22 showed unchanged coronary anatomy, suspect patient got ischemic with hypotension.  Impella 5.5 out 4/23.   - Volume status stable. Continue lasix 40 mg po daily.  - Off entresto 4/25 due hypotension.  - Continue Losartan 25 mg daily - Dig level 1.2. Hold today and decrease to 0.0625.  - Continue spironolactone 25 mg daily  - Coreg 3.125 added but bradycardic in the upper 40s and low SBP in 90s. Symptomatic w/ fatigue. Therefore, Coreg stopped.     - Continue Lasix 40 mg po daily.   - If no improvement, can repeat RHC to look for low output though she seems to be stable from a cardiac standpoint to me.  3. AKI: Creatinine as high as 2.86, resolved. Creatinine remains normalized.     4. CAD: LHC in 3/21 showed occluded  SVG-RCA and SVG-OM and atretic LIMA-LAD.  The LAD was patent with 60% mid vessel stenosis, the RCA was occluded with collaterals, and OM1 and OM2 were occluded.  No intervention.  Surprisingly, EF reported as normal pre-op.  No chest pain but had dyspnea and anxiety today.  ECG did not show STEMI, showed fairly stable lateral TWIs.  HS-TnI to 2957. [place] 4/22 showed unchanged coronary anatomy, suspect she was ischemic with hypotension.  No interventional target.  - Continue statin, ASA for now. Will eventually stop ASA as she will need long-term oral anticoagulation (Eliquis) unless vascular wants an antiplatelet agent post-aortobifemoral bypass.  5. PAD: S/p aortobifemoral bypass on 4/13.  Surgical site  is stable, she has had palpable PT pulses.  6. Acute hypoxemic respiratory failure: Intubated with PEA arrest. Resolved, extubated.  Chest still sore from CPR.  -Stable on room air.  7. Atrial fibrillation: Paroxysmal.  She has has been in and out of atrial fibrillation post-op.  Has had episodes of junctional rhythm also.  Maintaining NSR currently.   - Continue amio 200 mg once a day. - On eliquis 5 mg twice a day. Hgb stable.  8. Thrombocytopenia: Post-Impella placement.  - Resolved.  9. Anemia, post-op: hgb 7.6 -> 7.8->7.5 -> 6.3 -> transfused 2 units -> 10 -> 10.->11. Ecchymosis left brachial site.  10. Deconditioning - Continue PT/OT - Pt declined SNF. Planning for [facility]  11. 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  12. Failure to thrive: Afebrile, normal WBCs, vitals stable, creatinine stable.  Unsure why she is feeling "bad." Eating poorly, does not like Ensure.  - Allow family to bring in milk shakes for her.  -Dig level 1.2. Decrease digoxin.  - + UTI  - Ammonia 30 -Hepatic Panel ok.    13. UTI: UA with many leukocytes + large amount bacteria. Culture pending.  - Discussed with pharmacy. Cover with antibiotics => ceftriaxone.     Length of Stay: [place], NP  07/09/2019, 7:21 AM  Advanced Heart Failure Team Pager [phone] (M-F; 7a - 4p)  Please contact [facility] for night-coverage after hours (4p -7a ) and weekends on [email or web address]  Patient seen with NP, agree with the above note.   Still weak but seems a little better today.  Ate some yesterday but appetite poor.  UTI on UA.  Digoxin level mildly elevated at 1.2.   Complains of her chronic back pain.  No dyspnea.   General: NAD Neck: No JVD, no thyromegaly or thyroid nodule.  Lungs: Clear to auscultation bilaterally with normal respiratory effort. CV: Nondisplaced PMI.  Heart regular S1/S2, no S3/S4, no murmur.  No peripheral edema.   Abdomen: Soft, nontender, no hepatosplenomegaly, no distention.  Skin: Intact without lesions or rashes.  Neurologic: Alert and oriented x 3.  Psych: Normal affect. Extremities: No clubbing or cyanosis.  HEENT: Normal.   Will start ceftriaxone for UTI (send urine culture first).   Volume status stable, continue current Lasix, losartan, spironolactone.  Did not tolerate low dose Coreg.  Digoxin level 1.2, hold today and decrease digoxin to 0.0625 mg daily.   Needs to work with PT/OT.  Will need SNF.   Loralie Champagne 07/09/2019 8:29 AM

## 2019-07-09 NOTE — TOC Progression Note (Signed)
Transition of Care [facility]) - Progression Note    Patient Details  Name: Wendy [name] MRN: [id number] Date of Birth: 1950/05/29  Transition of [facility]) CM/SW [place], [place] Phone Number: 07/09/2019, 2:23 PM  Clinical Narrative:     CSW visit with the patient at bedside. CSW informed patient of bed offers. Patient chose [place] contacted [facility]- waiting on response to confirm bed offer and availability. Per patient's request, CSW contacted her sister, [name] and informed of SNF choice. Patient and family states no questions at this time.  CSW will continue to follow and assist with discharge planning.  Thurmond Butts, MSW, [place] Clinical Social Worker    Expected Discharge Plan: [place] Barriers to Discharge: Continued Medical Work up  Expected Discharge Plan and Services Expected Discharge Plan: [place] In-house Referral: Clinical Social Work                                             Social Determinants of Health (SDOH) Interventions    Readmission Risk Interventions No flowsheet data found.

## 2019-07-09 NOTE — Discharge Instructions (Signed)

## 2019-07-09 NOTE — Progress Notes (Signed)
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 PMHx: L to R femorofemoral bypass. PMHx: COPD, CAD, HTN, past MI, PVDx, cardiac stents.   OT comments  Pt in bed upon arrival and agreeable to therapy with max encouragement. Pt sat EOB with min A and participated in UB ADLs seated at EOB with Fair sitting balance. Pt able to don socks by crossing legs with min A. Sit - stand form EOB to RW min A with cues for safety, clothing mgt mod A, transfer to recliner min A. Educated pt on importance or being OOB daily and working with therapy at every opportunity for he rrecovery. OT will continue to follow acutely  Follow Up Recommendations  SNF    Equipment Recommendations  3 in 1 bedside commode;Other (comment)(TBD at next venue of care)    Recommendations for Other Services      Precautions / Restrictions Precautions Precautions: Fall Precaution Comments: watch O2 Restrictions Weight Bearing Restrictions: Yes RUE Weight Bearing: Weight bearing as tolerated LUE Weight Bearing: Weight bearing as tolerated       Mobility Bed Mobility Overal bed mobility: Needs Assistance Bed Mobility: Supine to Sit     Supine to sit: Min assist     General bed mobility comments: min A to elevate trunk  Transfers Overall transfer level: Needs assistance Equipment used: Rolling walker (2 wheeled) Transfers: Sit to/from [facility] Sit to Stand: Min assist Stand pivot transfers: Min assist       General transfer comment: Assist to bring hips up. Verbal cues for hand placement. Bed to [facility] to recliner    Balance Overall balance assessment: Needs assistance Sitting-balance  support: No upper extremity supported;Feet supported Sitting balance-Leahy Scale: Fair Sitting balance - Comments: put socks on seated at EOB with crossed leg technique   Standing balance support: Bilateral upper extremity supported;During functional activity Standing balance-Leahy Scale: Poor                             ADL either performed or assessed with clinical judgement   ADL Overall ADL's : Needs assistance/impaired Eating/Feeding: Set up;Sitting   Grooming: Wash/dry face;Wash/dry hands;Set up;Sitting   Upper Body Bathing: Minimal assistance;Sitting Upper Body Bathing Details (indicate cue type and reason): simulated     Upper Body Dressing : Minimal assistance;Sitting     Lower Body Dressing Details (indicate cue type and reason): donned socks by crossing legs Toilet Transfer: BSC;Minimal assistance;Stand-pivot;RW;Cueing for safety;Cueing for sequencing   Toileting- Clothing Manipulation and Hygiene: Moderate assistance;Sit to/from stand       Functional mobility during ADLs: Minimal assistance;Cueing for safety;Cueing for sequencing;Rolling walker       Vision Baseline Vision/History: Wears glasses Wears Glasses: At all times Patient Visual Report: No change from baseline Vision Assessment?: No apparent visual deficits   Perception     Praxis      Cognition Arousal/Alertness: Awake/alert Behavior During Therapy: Flat affect Overall Cognitive Status: Within Functional Limits for tasks assessed  Exercises     Shoulder Instructions       General Comments      Pertinent Vitals/ Pain       Pain Assessment: Faces Pain Score: 7  Pain Location: back Pain Descriptors / Indicators: Aching Pain Intervention(s): Limited activity within patient's tolerance;Monitored during session;Repositioned;Patient requesting pain meds-RN notified  Home Living                                           Prior Functioning/Environment              Frequency  Min 2X/week        Progress Toward Goals  OT Goals(current goals can now be found in the care plan section)  Progress towards OT goals: Progressing toward goals  Acute Rehab OT Goals Patient Stated Goal: "to walk into y house"  Plan Discharge plan remains appropriate    Co-evaluation                 AM-PAC OT "6 Clicks" Daily Activity     Outcome Measure   Help from another person eating meals?: A Little Help from another person taking care of personal grooming?: A Little Help from another person toileting, which includes using toliet, bedpan, or urinal?: A Lot Help from another person bathing (including washing, rinsing, drying)?: A Lot Help from another person to put on and taking off regular upper body clothing?: A Little Help from another person to put on and taking off regular lower body clothing?: A Lot 6 Click Score: 15    End of Session Equipment Utilized During Treatment: Gait belt;Rolling walker;Other (comment)(BSC)  OT Visit Diagnosis: Unsteadiness on feet (R26.81);Muscle weakness (generalized) (M62.81)   Activity Tolerance Patient tolerated treatment well   Patient Left with call bell/phone within reach;in chair;with nursing/sitter in room   Nurse Communication          Time: [id number] OT Time Calculation (min): 24 min  Charges: OT General Charges $OT Visit: 1 Visit OT Treatments $Self Care/Home Management : 8-22 mins $Therapeutic Activity: 8-22 mins     Britt Bottom 07/09/2019, 1:15 PM

## 2019-07-09 NOTE — Progress Notes (Addendum)
  Progress Note    07/09/2019 7:42 AM 9 Days Post-Op  Subjective:  Complains of back pain   Vitals:   07/09/19 0400 07/09/19 0500  BP: (!) 105/46   Pulse: 66 68  Resp: 15 13  Temp: 98.1 F (36.7 C)   SpO2: 99% 100%   Physical Exam: Lungs:  Non labored Incisions:  R chest c/d/i; L brachial incision healing well but palpable fluid collection; abd midline and groin incisions healed Extremities:  Palpable DP pulses; palpable L ulnar pulse; faintly palpable R radial pulse Abdomen:  soft Neurologic: A&O  CBC    Component Value Date/Time   WBC 4.2 07/09/2019 0323   RBC 3.77 (L) 07/09/2019 0323   HGB 11.0 (L) 07/09/2019 0323   HGB 13.4 06/04/2019 1036   HCT 35.7 (L) 07/09/2019 0323   HCT 41.7 06/04/2019 1036   PLT 225 07/09/2019 0323   PLT 205 06/04/2019 1036   MCV 94.7 07/09/2019 0323   MCV 86 06/04/2019 1036   MCH 29.2 07/09/2019 0323   MCHC 30.8 07/09/2019 0323   RDW 16.7 (H) 07/09/2019 0323   RDW 17.4 (H) 06/04/2019 1036   LYMPHSABS 0.6 (L) 07/03/2019 0325   MONOABS 0.3 07/03/2019 0325   EOSABS 0.1 07/03/2019 0325   BASOSABS 0.0 07/03/2019 0325    BMET    Component Value Date/Time   NA 140 07/09/2019 0323   NA 141 06/04/2019 1036   K 4.3 07/09/2019 0323   CL 97 (L) 07/09/2019 0323   CO2 31 07/09/2019 0323   GLUCOSE 81 07/09/2019 0323   BUN 14 07/09/2019 0323   BUN 14 06/04/2019 1036   CREATININE 1.11 (H) 07/09/2019 0323   CREATININE 0.96 08/13/2015 0939   CALCIUM 9.1 07/09/2019 0323   GFRNONAA 51 (L) 07/09/2019 0323   GFRAA 60 (L) 07/09/2019 0323    INR    Component Value Date/Time   INR 1.8 (H) 06/24/2019 1458     Intake/Output Summary (Last 24 hours) at 07/09/2019 0742 Last data filed at 07/08/2019 1919 Gross per 24 hour  Intake 165.58 ml  Output 1350 ml  Net -1184.42 ml     Assessment/Plan:  69 y.o. female is s/p Aortobifemoral bypass4/13with subsequent PEA arrest. 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 9 Days Post-Op   BLE well perfused L arm fluid collection at brachial cutdown; no indication for exploration Abd midline and groin incisions well healed UTI based on UA; pharmacy to help with abx selection Encouraged patient to increase mobility and p.o. intake Hgb stable on Eliquis   Dagoberto Ligas, PA-C [facility] [phone] 07/09/2019 7:42 AM   I have independently interviewed and examined patient and agree with PA assessment and plan above. Currently in chair. Will need SNF.  Makinsey Pepitone C. Donzetta Matters, MD [facility] [place] Office: [phone] Pager: [phone]

## 2019-07-10 DIAGNOSIS — I7409 Other arterial embolism and thrombosis of abdominal aorta: Secondary | ICD-10-CM | POA: Diagnosis not present

## 2019-07-10 DIAGNOSIS — I5022 Chronic systolic (congestive) heart failure: Secondary | ICD-10-CM | POA: Diagnosis not present

## 2019-07-10 LAB — GLUCOSE, CAPILLARY
Glucose-Capillary: 128 mg/dL — ABNORMAL HIGH (ref 70–99)
Glucose-Capillary: 130 mg/dL — ABNORMAL HIGH (ref 70–99)
Glucose-Capillary: 164 mg/dL — ABNORMAL HIGH (ref 70–99)
Glucose-Capillary: 85 mg/dL (ref 70–99)
Glucose-Capillary: 90 mg/dL (ref 70–99)

## 2019-07-10 LAB — CBC WITH DIFFERENTIAL/PLATELET
Abs Immature Granulocytes: 0.04 10*3/uL (ref 0.00–0.07)
Basophils Absolute: 0.1 10*3/uL (ref 0.0–0.1)
Basophils Relative: 1 %
Eosinophils Absolute: 0.2 10*3/uL (ref 0.0–0.5)
Eosinophils Relative: 4 %
HCT: 33.9 % — ABNORMAL LOW (ref 36.0–46.0)
Hemoglobin: 10.5 g/dL — ABNORMAL LOW (ref 12.0–15.0)
Immature Granulocytes: 1 %
Lymphocytes Relative: 17 %
Lymphs Abs: 0.8 10*3/uL (ref 0.7–4.0)
MCH: 29.5 pg (ref 26.0–34.0)
MCHC: 31 g/dL (ref 30.0–36.0)
MCV: 95.2 fL (ref 80.0–100.0)
Monocytes Absolute: 0.4 10*3/uL (ref 0.1–1.0)
Monocytes Relative: 9 %
Neutro Abs: 3.3 10*3/uL (ref 1.7–7.7)
Neutrophils Relative %: 68 %
Platelets: 233 10*3/uL (ref 150–400)
RBC: 3.56 MIL/uL — ABNORMAL LOW (ref 3.87–5.11)
RDW: 16.7 % — ABNORMAL HIGH (ref 11.5–15.5)
WBC: 4.8 10*3/uL (ref 4.0–10.5)
nRBC: 0 % (ref 0.0–0.2)

## 2019-07-10 LAB — BASIC METABOLIC PANEL
Anion gap: 11 (ref 5–15)
BUN: 15 mg/dL (ref 8–23)
CO2: 32 mmol/L (ref 22–32)
Calcium: 9 mg/dL (ref 8.9–10.3)
Chloride: 95 mmol/L — ABNORMAL LOW (ref 98–111)
Creatinine, Ser: 1.25 mg/dL — ABNORMAL HIGH (ref 0.44–1.00)
GFR calc Af Amer: 52 mL/min — ABNORMAL LOW (ref >60)
GFR calc non Af Amer: 44 mL/min — ABNORMAL LOW (ref >60)
Glucose, Bld: 94 mg/dL (ref 70–99)
Potassium: 4.2 mmol/L (ref 3.5–5.1)
Sodium: 138 mmol/L (ref 135–145)

## 2019-07-10 LAB — MAGNESIUM: Magnesium: 1.9 mg/dL (ref 1.7–2.4)

## 2019-07-10 LAB — SARS CORONAVIRUS 2 (TAT 6-24 HRS): SARS Coronavirus 2: NEGATIVE

## 2019-07-10 MED ORDER — ASPIRIN 81 MG PO CHEW: 81.0000 mg | CHEWABLE_TABLET | Freq: Every day | ORAL | Status: DC

## 2019-07-10 MED ORDER — OXYCODONE HCL 10 MG PO TABS: 10.0000 mg | ORAL_TABLET | Freq: Three times a day (TID) | ORAL | 0 refills | Status: DC

## 2019-07-10 MED ORDER — AMIODARONE HCL 200 MG PO TABS
100.0000 mg | ORAL_TABLET | Freq: Every day | ORAL | Status: DC
  Administered 2019-07-11 – 2019-07-12 (×2): 100 mg via ORAL
  Filled 2019-07-10 (×2): qty 1

## 2019-07-10 MED ORDER — FUROSEMIDE 20 MG PO TABS
20.0000 mg | ORAL_TABLET | Freq: Every day | ORAL | Status: DC
  Administered 2019-07-11 – 2019-07-12 (×2): 20 mg via ORAL
  Filled 2019-07-10 (×2): qty 1

## 2019-07-10 NOTE — Plan of Care (Signed)
Continue to monitor

## 2019-07-10 NOTE — Progress Notes (Addendum)
  Progress Note    07/10/2019 7:33 AM 10 Days Post-Op  Subjective:  No complaints   Vitals:   07/10/19 0400 07/10/19 0535  BP: 115/64 121/60  Pulse: 73 76  Resp: 15 (!) 21  Temp:  97.9 F (36.6 C)  SpO2: 96% 98%   Physical Exam: Lungs:  Non labored Incisions:  R chest incision c/d/i; L brachial incision with fluid collection but healing well; abd and groin incisions healed Extremities:  Symmetrical DP pulses; symmetrical ulnar pulses Abdomen:  Soft, NT, ND Neurologic: A&O  CBC    Component Value Date/Time   WBC 4.8 07/10/2019 0435   RBC 3.56 (L) 07/10/2019 0435   HGB 10.5 (L) 07/10/2019 0435   HGB 13.4 06/04/2019 1036   HCT 33.9 (L) 07/10/2019 0435   HCT 41.7 06/04/2019 1036   PLT 233 07/10/2019 0435   PLT 205 06/04/2019 1036   MCV 95.2 07/10/2019 0435   MCV 86 06/04/2019 1036   MCH 29.5 07/10/2019 0435   MCHC 31.0 07/10/2019 0435   RDW 16.7 (H) 07/10/2019 0435   RDW 17.4 (H) 06/04/2019 1036   LYMPHSABS 0.8 07/10/2019 0435   MONOABS 0.4 07/10/2019 0435   EOSABS 0.2 07/10/2019 0435   BASOSABS 0.1 07/10/2019 0435    BMET    Component Value Date/Time   NA 138 07/10/2019 0435   NA 141 06/04/2019 1036   K 4.2 07/10/2019 0435   CL 95 (L) 07/10/2019 0435   CO2 32 07/10/2019 0435   GLUCOSE 94 07/10/2019 0435   BUN 15 07/10/2019 0435   BUN 14 06/04/2019 1036   CREATININE 1.25 (H) 07/10/2019 0435   CREATININE 0.96 08/13/2015 0939   CALCIUM 9.0 07/10/2019 0435   GFRNONAA 44 (L) 07/10/2019 0435   GFRAA 52 (L) 07/10/2019 0435    INR    Component Value Date/Time   INR 1.8 (H) 06/24/2019 1458     Intake/Output Summary (Last 24 hours) at 07/10/2019 0733 Last data filed at 07/09/2019 1400 Gross per 24 hour  Intake 320 ml  Output 350 ml  Net -30 ml     Assessment/Plan:  69 y.o. female is s/p Aortobifemoral bypass4/13with subsequent PEA arrest. 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 10 Days Post-Op   All extremities  well perfused with peripheral pulses intact Ceftriaxone for UTI Hgb remains stable on Eliquis Pt was accepted to [facility]; [facility] team awaiting bed confirmation   Dagoberto Ligas, PA-C [facility] [phone] 07/10/2019 7:33 AM   I have independently interviewed and examined patient and agree with PA assessment and plan above.  Wounds continue to heal well with stable hematomas bilateral upper extremities.  Plan will be continue Eliquis and transition to SNF.  Ceftriaxone for UTI.  Nas Wafer C. Donzetta Matters, MD [facility] [place] Office: [phone] Pager: [phone]

## 2019-07-10 NOTE — Progress Notes (Addendum)
Per CCMD, patient in new junctional rhythm with HR 40-50's. BP 111/61. Patient sitting in chair and states she feels like her heart is pounding. EKG done. MD paged at this time.   1515: ICU RN at bedside.   1524: Ellen Henri, PA at bedside.

## 2019-07-10 NOTE — TOC Progression Note (Signed)
Transition of Care [facility]) - Progression Note    Patient Details  Name: REGEINA MENDEN MRN: [id number] Date of Birth: 04-30-1950  Transition of Care [facility]) CM/SW [place], [place] Phone Number: 07/10/2019, 11:18 AM  Clinical Narrative:      Mendel Corning confirmed bed offer- SNF started insurance authorization.  Thurmond Butts, MSW, [place] Clinical Social Worker   Expected Discharge Plan: [place] Barriers to Discharge: Continued Medical Work up  Expected Discharge Plan and Services Expected Discharge Plan: [place] In-house Referral: Clinical Social Work                                             Social Determinants of Health (SDOH) Interventions    Readmission Risk Interventions No flowsheet data found.

## 2019-07-10 NOTE — Discharge Summary (Signed)
Aorta Discharge Summary    Wendy [name] [birth year] 69 y.o. female  [id number]  Admission Date: 06/18/19  Discharge Date: 07/12/19  Physician: Thomes Lolling*  Admission Diagnosis: Aortoiliac occlusive disease [facility]) [I74.09]  Discharge Day services:    see progress note 07/10/19  Hospital Course:  The patient was admitted to the hospital and taken to the operating room on 06/18/2019 and underwent aortobifemoral bypass with reimplantation of right profunda on aortobifemoral limb by Dr. Donzetta Matters on 06/18/2019.  POD #1 patient experienced chest discomfort and atrial fibrillation with RVR.  Cardiology was consulted.  Nephrology was also consulted postoperatively due to poor urine output, and increase in BUN and creatinine.  She remained in the ICU for 4 days.  On 06/24/2019 she experienced PEA arrest and stat echocardiogram demonstrated severe global hypokinesis.  She was taken emergently to the operating room by cardiothoracic surgeon Dr. Orvan Seen for Impella placement in right axillary artery.  She was extubated on 06/25/2019.  She eventually underwent removal of Impella by Dr. Orvan Seen on 06/28/2019.  She underwent cardiac catheterization by Dr. Burt Knack on 06/27/2019 via left brachial artery approach.  She had stable coronary anatomy compared to preoperatively and thus medical therapy and supportive measures were recommended.  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 Trula Slade on 06/30/2019.  Postoperatively hemoglobin was noted to be 6.3 and she was transfused 2 units of packed red blood cells on 07/01/2019.  A few days later she was stable enough for transfer out of the ICU to stepdown unit.  The remainder of her hospital stay consisted of increasing mobility and evaluation with PT/OT.  She was recommended for CIR.  She was accepted to CIR based on rehab admission evaluation however was denied from her insurance company.  Appeal  was also denied.  Social work was consulted for skilled nursing facility placement.  At the time of discharge her hemoglobin is stable having transition from IV heparin to Eliquis.  She is perfusing all extremities with peripheral pulses intact.  Her abdomen and groin incisions have completely healed.  She will be discharged to a skilled nursing facility [facility].  She will follow-up in office in about 2 to 3 weeks to follow-up with Dr. Donzetta Matters.  She will be prescribed 2 to 3 days of narcotic pain medication for continued postoperative pain control.  She will be discharged in stable condition.  CBC    Component Value Date/Time   WBC 3.8 (L) 07/12/2019 0259   RBC 3.38 (L) 07/12/2019 0259   HGB 10.0 (L) 07/12/2019 0259   HGB 13.4 06/04/2019 1036   HCT 32.3 (L) 07/12/2019 0259   HCT 41.7 06/04/2019 1036   PLT 208 07/12/2019 0259   PLT 205 06/04/2019 1036   MCV 95.6 07/12/2019 0259   MCV 86 06/04/2019 1036   MCH 29.6 07/12/2019 0259   MCHC 31.0 07/12/2019 0259   RDW 16.8 (H) 07/12/2019 0259   RDW 17.4 (H) 06/04/2019 1036   LYMPHSABS 0.7 07/12/2019 0259   MONOABS 0.3 07/12/2019 0259   EOSABS 0.2 07/12/2019 0259   BASOSABS 0.1 07/12/2019 0259    BMET    Component Value Date/Time   NA 139 07/12/2019 0259   NA 141 06/04/2019 1036   K 4.2 07/12/2019 0259   CL 100 07/12/2019 0259   CO2 31 07/12/2019 0259   GLUCOSE 117 (H) 07/12/2019 0259   BUN 21 07/12/2019 0259   BUN 14 06/04/2019 1036  CREATININE 1.22 (H) 07/12/2019 0259   CREATININE 0.96 08/13/2015 0939   CALCIUM 8.4 (L) 07/12/2019 0259   GFRNONAA 46 (L) 07/12/2019 0259   GFRAA 53 (L) 07/12/2019 0259     Discharge Instructions    Call MD for:  redness, tenderness, or signs of infection (pain, swelling, bleeding, redness, odor or green/yellow discharge around incision site)   Complete by: As directed    Call MD for:  severe or increased pain, loss or decreased feeling  in affected limb(s)   Complete by: As directed    Call  MD for:  temperature >100.5   Complete by: As directed    Resume previous diet   Complete by: As directed       Discharge Diagnosis:  Aortoiliac occlusive disease ([place]) [I74.09]  Secondary Diagnosis: Patient Active Problem List   Diagnosis Date Noted  . Cardiogenic shock ([place])   . Aortoiliac occlusive disease ([place]) 06/18/2019  . Coronary artery disease of native artery of native heart with stable angina pectoris ([place])   . Infection of right prosthetic hip joint ([place]) 08/16/2017  . Acute combined systolic and diastolic heart failure ([place]) 07/26/2016  . Troponin level elevated 07/26/2016  . Chronic anticoagulation 07/22/2016  . CAD S/P multiple PCIs-last one 2016 07/22/2016  . NSTEMI (non-ST elevated myocardial infarction) ([place]) 07/22/2016  . Primary osteoarthritis of right hip 06/27/2016  . Avascular necrosis of hip, right ([place]) 06/27/2016  . PVD (peripheral vascular disease) ([place])   . Essential (primary) hypertension 01/14/2015  . Coronary artery disease involving native coronary artery of native heart without angina pectoris 10/06/2014  . Acute angina ([place]) 05/12/2014  . Epigastric pain   . Chest pain with high risk of acute coronary syndrome 10/21/2013  . Acute respiratory failure ([place]) 09/20/2013  . Nonspecific abnormal results of liver function study 09/17/2013  . Cholecystitis, acute 09/15/2013  . PAD (peripheral artery disease) ([place])   . COPD (chronic obstructive pulmonary disease) ([place])   . Peripheral neuropathy   . Chronic back pain   . Sleep apnea   . Anxiety   . GERD (gastroesophageal reflux disease)   . 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 Hypertension 07/20/2013  . HLD (hyperlipidemia) 07/20/2013  . Unstable angina ([place]) 07/19/2013  . Hx of CABG 07/19/2013   Past Medical History:  Diagnosis Date  . Anemia   . Anginal pain ([place])    jaw pain 07/2016, s/p 07/15/16 nuclear stress test  . Anxiety   . Arthritis    "lower back" (08/24/2017)  . Asthma   . CAD  (coronary artery disease), native coronary artery    a. Hx CABG 1998, last PCI 2013 b.LHC 07/2013:  EF 55-60%, L-LAD prob atretic, no sig disease in LAD, S-OM1 ok with patent stent, S-dRCA ok => med Rx. c. LHC 01/2015 DES to SVG-RCA.  Marland Kitchen Chronic back pain   . Chronic leg pain   . Chronic lower back pain   . COPD (chronic obstructive pulmonary disease) ([place])   . Depression   . DVT (deep venous thrombosis) ([facility]) X 3   RLE  . Fall 08/2016  . GERD (gastroesophageal reflux disease)   . Glucose intolerance (impaired glucose tolerance)   . Headache    "a few/month" (08/24/2017)  . HLD (hyperlipidemia)   . Hypertension   . Myocardial infarction ([place]) 1999  . OSA on CPAP   . PAD (peripheral artery disease) ([place])   . PAF (paroxysmal atrial fibrillation) ([place])    a. 09/2013 post-op b. 01/2015  .  Peripheral neuropathy   . Pneumonia   . Shortness of breath    when walking  . Wears glasses      Allergies as of 07/12/2019      Reactions   Benadryl [diphenhydramine] Shortness Of Breath   Penicillins Diarrhea   Did it involve swelling of the face/tongue/throat, SOB, or low BP? No Did it involve sudden or severe rash/hives, skin peeling, or any reaction on the inside of your mouth or nose? No Did you need to seek medical attention at a hospital or doctor's office? No When did it last happen?2 months If all above answers are "NO", may proceed with cephalosporin use.   Adhesive [tape] Other (See Comments)   Burning of skin   Amoxicillin Diarrhea, Other (See Comments)   Has patient had a PCN reaction causing immediate rash, facial/tongue/throat swelling, SOB or lightheadedness with hypotension: No Has patient had a PCN reaction causing severe rash involving mucus membranes or skin necrosis: No Has patient had a PCN reaction that required hospitalization No Has patient had a PCN reaction occurring within the last 10 years: Yes -- noted rxn as diarrhea If all of the above answers are "NO",  then may proceed with Cephalosporin use.   Cyclobenzaprine Other (See Comments)   Causes restless leg syndrome Restless syndrome   Hydrocodone Itching      Medication List    TAKE these medications   acetaminophen 500 MG tablet Commonly known as: TYLENOL Take 500-1,000 mg by mouth every 6 (six) hours as needed (for pain.).   albuterol 108 (90 Base) MCG/ACT inhaler Commonly known as: VENTOLIN HFA Inhale 2 puffs into the lungs every 6 (six) hours as needed for wheezing or shortness of breath.   ALPRAZolam 0.5 MG tablet Commonly known as: XANAX Take 0.5 mg by mouth 3 (three) times daily as needed for anxiety.   amiodarone 100 MG tablet Commonly known as: PACERONE Take 1 tablet (100 mg total) by mouth daily. Start taking on: Jul 13, 2019   apixaban 5 MG Tabs tablet Commonly known as: Eliquis Take 1 tablet (5 mg total) by mouth 2 (two) times daily.   aspirin 81 MG chewable tablet Chew 1 tablet (81 mg total) by mouth daily.   atorvastatin 80 MG tablet Commonly known as: LIPITOR TAKE ONE TABLET BY MOUTH DAILY AT 6PM What changed:   how much to take  how to take this  when to take this   buPROPion 150 MG 24 hr tablet Commonly known as: WELLBUTRIN XL Take 150 mg by mouth daily.   ciprofloxacin 500 MG tablet Commonly known as: Cipro Take 1 tablet (500 mg total) by mouth 2 (two) times daily.   clopidogrel 75 MG tablet Commonly known as: PLAVIX Take 75 mg by mouth daily.   diclofenac Sodium 1 % Gel Commonly known as: VOLTAREN Apply 1 g topically 4 (four) times daily as needed for pain.   ezetimibe 10 MG tablet Commonly known as: ZETIA Take 10 mg by mouth daily.   furosemide 20 MG tablet Commonly known as: Lasix Take 1 tablet (20 mg total) by mouth daily as needed. What changed: reasons to take this   gabapentin 300 MG capsule Commonly known as: NEURONTIN Take 300 mg by mouth 3 (three) times daily as needed for pain.   isosorbide mononitrate 60 MG 24 hr  tablet Commonly known as: IMDUR Take 1.5 tablets (90 mg total) by mouth every morning. What changed:   how much to take  when to take  this   metoprolol tartrate 50 MG tablet Commonly known as: LOPRESSOR Take 1 tablet (50 mg total) by mouth 2 (two) times daily.   nitroGLYCERIN 0.4 MG SL tablet Commonly known as: NITROSTAT Place 1 tablet (0.4 mg total) under the tongue every 5 (five) minutes as needed for chest pain.   Oxycodone HCl 10 MG Tabs Take 1 tablet (10 mg total) by mouth 3 (three) times daily.   pantoprazole 40 MG tablet Commonly known as: PROTONIX Take 1 tablet (40 mg total) by mouth daily.   pramipexole 0.25 MG tablet Commonly known as: MIRAPEX Take 0.25 mg by mouth at bedtime.   Ranexa 1000 MG SR tablet Generic drug: ranolazine Take 1,000 mg by mouth 2 (two) times daily.   sertraline 100 MG tablet Commonly known as: ZOLOFT Take 150 mg by mouth at bedtime.   valsartan-hydrochlorothiazide 160-12.5 MG tablet Commonly known as: DIOVAN-HCT Take 1 tablet by mouth daily.       Instructions:  [facility] Discharge Instructions Open Aortic Surgery  Please refer to the following instructions for your post-procedure care. Your surgeon or Physician Assistant will discuss any changes with you.  Activity  Avoid lifting more than eight pounds (a gallon of milk) until after your first post-operative visit. You are encouraged to walk as much as you can. You can slowly return to normal activities but must avoid strenuous activity and heavy lifting until your doctor tells you it's OK. Heavy lifting can hurt the incision and cause a hernia. Avoid activities such as vacuuming or swinging a golf club. It is normal to feel tired for several weeks after your surgery. Do not drive until your doctor gives the OK and you are no longer taking prescription pain medications. It is also normal to have difficulty with sleep habits, eating and bowl  movements after surgery. These will go away with time.  Bathing/Showering  You may shower after you go home. Do not soak in a bathtub, hot tub, or swim until the incision heals.  Incision Care  Shower every day. Clean your incision with mild soap and water. Pat the area dry with a clean towel. You do not need a bandage unless otherwise instructed. Do not apply any ointments or creams to your incision. You may have skin glue on your incision. Do not peel it off. It will come off on its own in about one week. If you have staples or sutures along your incision, they will be removed at your post op appointment.  If you have groin incisions, wash the groin wounds with soap and water daily and pat dry. (No tub bath-only shower)  Then put a dry gauze or washcloth in the groin to keep this area dry to help prevent wound infection.  Do this daily and as needed.  Do not use Vaseline or neosporin on your incisions.  Only use soap and water on your incisions and then protect and keep dry.  Diet  Resume your normal diet. There are no special food restriction following this procedure. A low fat/low cholesterol diet is recommended for all patients with vascular disease. After your aortic surgery, it's normal to feel full faster than usual and to not feel as hungry as you normally would. You will probably lose weight initially following your surgery. It's best to eat small, frequent meals over the course of the day. Call the office if you find that you are unable to eat even small meals. In order to heal  from your surgery, it is CRITICAL to get adequate nutrition. Your body requires vitamins, minerals, and protein. Vegetables are the best source of vitamins and minerals. causing pain, you may take over-the-counter pain reliever such as acetaminophen (Tylenol). If you were prescribed a stronger pain medication, please be aware these medication can cause nausea and constipation. Prevent nausea by taking the medication  with a snack or meal. Avoid constipation by drinking plenty of fluids and eating foods with a high amount of fiber, such as fruits, vegetables and grains. Take 100mg  of the over-the-counter stool softener Colace twice a day as needed to help with constipation. A laxative, such as Milk of Magnesia, may be recommended for you at this time. Do not take a laxative unless your surgeon or Physician Assistant. tells you it's OK. Do not take Tylenol if you are taking stronger pain medications (such as Percocet).  Follow Up  Our office will schedule a follow up appointment 2-3 weeks after discharge.  Please call us immediately for any of the following conditions    .     Severe or worsening pain in your legs or feet or in your abdomen back or chest. . Increased pain, redness drainage (pus) from your incision site. . Increased abdominal pain, bloating, nausea, vomiting, or persistent diarrhea. . Fever of 101 degrees or higher. . Swelling in your leg (s). .  Reduce your risk of vascular disease  . Stop smoking. If you would like help, call [facility] at 1-800-QUIT-NOW 667-715-6977) or [place] at (519)756-9963. . Manage your cholesterol . Maintain a desired weight . Control your diabetes . Keep your blood pressure down .  If you have any questions please call the office at [phone].   Disposition: SNF  Patient's condition: is Fair  Follow up: 1. Dr. Donzetta Matters in 2-3 weeks   Dagoberto Ligas, PA-C [facility] [phone] 07/12/2019  1:14 PM   - For VQI Registry use -  Post-op:  Vasopressors Req. Post-op: Yes ICU Stay: 10 days Transfusion: Yes   If yes, 2 units given MI: Yes, [ ]  Troponin only, [ ]  EKG or Clinical New Arrhythmia: No  Complications: CHF: Yes Resp failure: No, [ ]  Pneumonia, [ ]  Ventilator Chg in renal function: Yes, [x ] Inc. Cr > 0.5, [ ]  Temp. Dialysis, [ ]  Permanent dialysis Leg ischemia: No, no Surgery needed, [ ]  Yes, Surgery needed, [  ] Amputation Bowel ischemia: No, [ ]  Medical Rx, [ ]  Surgical Rx Wound complication: No, [ ]  Superficial separation/infection, [ ]  Return to OR Return to OR: No  Return to OR for bleeding: No Stroke: No, [ ]  Minor, [ ]  Major  Discharge medications: Statin use:  Yes ASA use:  Yes   Plavix use:  No  Beta blocker use:  Yes  ACEI use:  No ARB use:  Yes CCB use:  No Coumadin use:  No, Eliquis

## 2019-07-10 NOTE — Progress Notes (Addendum)
Patient ID: Wendy [name], female   DOB: [birth year], 69 y.o.   MRN: [id number]    Advanced Heart Failure Rounding Note  PCP-Cardiologist: [name], MD   Subjective:    - 4/13 aortobifemoral bypass - Impella 5.5 placement 4/19 for cardiogenic shock.  - Extubated 4/20 - LHC 4/22  (left brachial access): All grafts occluded (known), totally occluded RCA, OM1, OM3, 80% dLCx, 60% pLAD.  No change, no intervention.  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   Now on ceftriaxone for UTI. Cx growing GNR. AF. WBC nl. Denies dysuria. No pelvic pain.   Digoxin held yesterday due to elevated level (1.2). Got reduced dose today 0.0625 mg. However w/ transient junctional bradycardia on tele, w/ rates mid 40s-50s. Also on 200 mg of amiodarone daily. BP stable. Feels tired.    SCr up slightly, 1.25 today   Denies CP/dyspnea. No syncope/ near syncope.   Waiting in SNF bed.   Objective:   Weight Range: 62.5 kg Body mass index is 26.04 kg/m.   Vital Signs:   Temp:  [97.6 F (36.4 C)-97.9 F (36.6 C)] 97.8 F (36.6 C) (05/05 0824) Pulse Rate:  [61-76] 68 (05/05 1019) Resp:  [11-21] 11 (05/05 0824) BP: (97-121)/(43-64) 120/63 (05/05 0824) SpO2:  [94 %-100 %] 100 % (05/05 0824) Weight:  [62.5 kg] 62.5 kg (05/05 0535) Last BM Date: 07/07/19  Weight change: Filed Weights   07/08/19 0400 07/09/19 0500 07/10/19 0535  Weight: 67.4 kg 61.8 kg 62.5 kg    Intake/Output:   Intake/Output Summary (Last 24 hours) at 07/10/2019 1109 Last data filed at 07/09/2019 1400 Gross per 24 hour  Intake 220 ml  Output 350 ml  Net -130 ml      Physical Exam   PHYSICAL EXAM: General:  Fatigued appearing WF. No respiratory difficulty HEENT: normal Neck: supple. no JVD. Carotids 2+ bilat; no bruits. No lymphadenopathy or thyromegaly appreciated. Cor:  PMI nondisplaced. Regular rate & rhythm. No rubs, gallops or murmurs. Lungs: clear Abdomen: soft, nontender, nondistended. No hepatosplenomegaly. No bruits or masses. Good bowel sounds. Extremities: no cyanosis, clubbing, rash, edema Neuro: alert & oriented x 3, cranial nerves grossly intact. moves all 4 extremities w/o difficulty. Affect pleasant.    Telemetry   NSR low 60s  personally reviewed.   Labs    CBC Recent Labs    07/09/19 0323 07/10/19 0435  WBC 4.2 4.8  NEUTROABS  --  3.3  HGB 11.0* 10.5*  HCT 35.7* 33.9*  MCV 94.7 95.2  PLT 225 0000000   Basic Metabolic Panel Recent Labs    07/09/19 0323 07/10/19 0435  NA 140 138  K 4.3 4.2  CL 97* 95*  CO2 31 32  GLUCOSE 81 94  BUN 14 15  CREATININE 1.11* 1.25*  CALCIUM 9.1 9.0  MG 2.0 1.9   Liver Function Tests Recent Labs    07/08/19 1141  AST 20  ALT 15  ALKPHOS 99  BILITOT 1.2  PROT 6.6  ALBUMIN 2.9*   No results for input(s): LIPASE, AMYLASE in the last 72 hours. Cardiac Enzymes No results for input(s): CKTOTAL, CKMB, CKMBINDEX, TROPONINI in the last 72 hours.  BNP: BNP (last 3 results) Recent Labs    06/19/19 1239  BNP 366.7*    ProBNP (last 3 results) No results for input(s): PROBNP in the last 8760  hours.   D-Dimer No results for input(s): DDIMER in the last 72 hours. Hemoglobin A1C No results for input(s): HGBA1C in the last 72 hours. Fasting Lipid Panel No results for input(s): CHOL, HDL, LDLCALC, TRIG, CHOLHDL, LDLDIRECT in the last 72 hours. Thyroid Function Tests No results for input(s): TSH, T4TOTAL, T3FREE, THYROIDAB in the last 72 hours.  Invalid input(s): FREET3  Other results:   Imaging    No results found.   Medications:     Scheduled Medications: . amiodarone  200 mg Oral Daily  . apixaban  5 mg Oral BID  . aspirin  81 mg Oral Daily  . atorvastatin  80 mg Oral QPC supper  . buPROPion  150 mg Oral Daily  . Chlorhexidine Gluconate Cloth  6 each Topical Daily    . digoxin  0.0625 mg Oral Daily  . ezetimibe  10 mg Oral Daily  . furosemide  40 mg Oral Daily  . insulin aspart  0-9 Units Subcutaneous Q4H  . LORazepam  0.5 mg Intravenous Once  . losartan  25 mg Oral Daily  . multivitamin with minerals  1 tablet Oral Daily  . oxyCODONE  10 mg Oral Q12H  . pantoprazole  40 mg Oral Daily  . pramipexole  0.25 mg Oral QHS  . sertraline  150 mg Oral QHS  . sodium chloride flush  3 mL Intravenous Q12H  . spironolactone  25 mg Oral Daily    Infusions: . sodium chloride    . cefTRIAXone (ROCEPHIN)  IV 1 g (07/10/19 1027)    PRN Medications: sodium chloride, acetaminophen **OR** acetaminophen, ALPRAZolam, alum & mag hydroxide-simeth, diphenhydrAMINE **OR** diphenhydrAMINE, docusate sodium, fentaNYL (SUBLIMAZE) injection, gabapentin, guaiFENesin-dextromethorphan, metoprolol tartrate, nitroGLYCERIN, ondansetron, phenol, polyethylene glycol   Assessment/Plan   1. Cardiogenic shock: Progressive worsening 4/19 culminating in PEA arrest.  Lactate 8.1.  She had episodes of hypotension and atrial fibrillation/RVR earlier in her post-op course as well.  Symptoms were dyspnea and anxiety no chest pain.  No STEMI on ECG but concern for possible coronary event based on history.  Initial troponin 1802 => 2957.  Initially on NE, epi then Impella 5.5 placed on 4/19.  Now MAP is stable and off pressors. Impella 5.5 out 4/23.  - Resolved.  2. Acute systolic CHF: Suspect ischemic cardiomyopathy.  Echo 06/24/19 EF 20-25%, diffuse hypokinesis, preserved RV function.  Pre-op echo 06/17/19 EF 60%.  HS-TnI 1802 => 2957, elevated but not markedly high.  [place] 4/22 showed unchanged coronary anatomy, suspect patient got ischemic with hypotension.  Impella 5.5 out 4/23.   - Volume status stable. Continue lasix 40 mg po daily.  - Off entresto 4/25 due hypotension.  - Continue Losartan 25 mg daily - Dig level 1.2 on 5/4. Held x 1 day. Got reduced dose, 0.0625 this am. Now w/ junctional  bradycardia, rates mid 40s-50s, BP stable. No highly symptomatic.  - Will stop dig fow now.  - Continue spironolactone 25 mg daily  - Tried on Coreg this admit, but also discontinued due to bradycardia/fatigue  - If no improvement, can repeat RHC to look for low output though she seems to be stable from a cardiac standpoint to me.  3. AKI: Creatinine as high as 2.86, resolved. 1.25 today.     4. CAD: LHC in 3/21 showed occluded SVG-RCA and SVG-OM and atretic LIMA-LAD.  The LAD was patent with 60% mid vessel stenosis, the RCA was occluded with collaterals, and OM1 and OM2 were occluded.  No intervention.  Surprisingly,  EF reported as normal pre-op.  No chest pain but had dyspnea and anxiety today.  ECG did not show STEMI, showed fairly stable lateral TWIs.  HS-TnI to 2957. [place] 4/22 showed unchanged coronary anatomy, suspect she was ischemic with hypotension.  No interventional target.  - Continue statin, ASA for now. Will eventually stop ASA as she will need long-term oral anticoagulation (Eliquis) unless vascular wants an antiplatelet agent post-aortobifemoral bypass.  5. PAD: S/p aortobifemoral bypass on 4/13.  Surgical site is stable, she has had palpable PT pulses.  6. Acute hypoxemic respiratory failure: Intubated with PEA arrest. Resolved, extubated.  Chest still sore from CPR.  -Stable on room air.  7. Atrial fibrillation: Paroxysmal.  She has has been in and out of atrial fibrillation post-op.  Has had episodes of junctional rhythm also.  Maintaining NSR currently.   - Continue amio 200 mg once a day. - On eliquis 5 mg twice a day. Hgb stable.  8. Thrombocytopenia: Post-Impella placement.  - Resolved.  9. Anemia, post-op: hgb 7.6 -> 7.8->7.5 -> 6.3 -> transfused 2 units -> 10 -> 10.->11. Ecchymosis left brachial site.  10. Deconditioning - Continue PT/OT - Planning SNF. 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   12. Failure to thrive: Afebrile, normal WBCs, vitals stable, creatinine stable.  Unsure why she is feeling "bad." Eating poorly, does not like Ensure.  - Allow family to bring in milk shakes for her.  - Ammonia 30 -Hepatic Panel ok.    13. UTI: UA with many leukocytes + large amount bacteria. Cultures growing GNRs - Discussed with pharmacy. Cover with antibiotics => on ceftriaxone.  14. Junctional Bradycardia - rates in the 40s-50s but not highly symptomatic. Dig level elevated on 5/4 (1.2). Placed on reduced dose.  - will stop digoxin  - reduce amiodarone dose to 100 mg daily.  - monitor closely on tele.   Length of Stay: [address], PA-C  07/10/2019, 11:09 AM  Advanced Heart Failure Team Pager [phone] (M-F; 7a - 4p)  Please contact [facility] for night-coverage after hours (4p -7a ) and weekends on [email or web address]  Patient seen with PA, agree with the above note.   She went into a junctional bradycardia with rate upper 40s-50s this afternoon, fairly asymptomatic.  Currently back in NSR in 60s.    Complains of back pain, poor energy level.   General: NAD Neck: No JVD, no thyromegaly or thyroid nodule.  Lungs: Clear to auscultation bilaterally with normal respiratory effort. CV: Nondisplaced PMI.  Heart regular S1/S2, no S3/S4, no murmur.  No peripheral edema.   Abdomen: Soft, nontender, no hepatosplenomegaly, no distention.  Skin: Intact without lesions or rashes.  Neurologic: Alert and oriented x 3.  Psych: Normal affect. Extremities: No clubbing or cyanosis.  HEENT: Normal.   With junctional bradycardia, will stop digoxin and decrease dose of amiodarone to 100 mg daily.  Continue to follow on telemetry.   Continue current losartan and spironolactone, creatinine mildly higher so think we can decrease Lasix to 20 mg daily.    E coli UTI, continue ceftriaxone.    Follow rhythm on telemetry.  If stable overnight, think she can go to SNF tomorrow.   Loralie Champagne 07/10/2019 4:13 PM

## 2019-07-10 NOTE — Progress Notes (Signed)
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 PMHx: L to R femorofemoral bypass. PMHx: COPD, CAD, HTN, past MI, PVDx, cardiac stents.    PT Comments    Pt in bed upon arrival of PT, agreeable to PT session with focus on progression of functional ambulation tolerance and mobility. The pt was able to demo significant progression of ambulation tolerance (3 ft -> 150 ft) with use of RW and minG for safety (VSS on RA). The pt reported modified borg of 8/10 following ambulation, but was able to recover with seated rest in 2-5 min. The pt was then able to complete 5x STS from recliner without use of AD and good stability. The pt will continue to benefit from skilled PT to progress functional stability, endurance, and independence with mobility prior to return home.   5X Sit-to-Stand: 22.7 sec (> 11.4 sec indicates increased risk of falls for individuals aged 60-69, > 15 sec indicates increased risk of recurrent falls)   Follow Up Recommendations  SNF;Supervision/Assistance - 24 hour     Equipment Recommendations  Other (comment)(defer to post acute)    Recommendations for Other Services       Precautions / Restrictions Precautions Precautions: Fall Precaution Comments: watch O2 Restrictions Weight Bearing Restrictions: Yes RUE Weight Bearing: Weight bearing as tolerated LUE Weight Bearing: Weight bearing as tolerated    Mobility  Bed Mobility Overal bed mobility: Needs Assistance Bed Mobility: Supine to Sit     Supine to sit: Min guard     General bed mobility comments: minG for safety to monitor lines, pt able to come to sitting EOB without physical assist, some VC for  sequencing  Transfers Overall transfer level: Needs assistance Equipment used: Rolling walker (2 wheeled);None Transfers: Sit to/from Stand Sit to Stand: Min guard         General transfer comment: minG for safety, pt able to rise without assist, 5xSTS from recliner without AD in 22.7 sec  Ambulation/Gait Ambulation/Gait assistance: Min guard Gait Distance (Feet): 150 Feet Assistive device: Rolling walker (2 wheeled) Gait Pattern/deviations: Shuffle;Trunk flexed;Step-through pattern;Decreased stride length Gait velocity: 0.36 m/s Gait velocity interpretation: <1.31 ft/sec, indicative of household ambulator General Gait Details: pt with slight trunk flexion despite cues for positioning relative to [employer] Mobility    Modified Rankin (Stroke Patients Only)       Balance Overall balance assessment: Needs assistance Sitting-balance support: No upper extremity supported;Feet supported Sitting balance-Leahy Scale: Fair Sitting balance - Comments: supervision   Standing balance support: Bilateral upper extremity supported;During functional activity Standing balance-Leahy Scale: Poor Standing balance comment: walker and min guard for static standing and ambulation                            Cognition Arousal/Alertness: Awake/alert Behavior During Therapy: Flat affect Overall Cognitive Status: Within Functional Limits for tasks assessed                                 General Comments: Following commands; pt noting she is feeling much  better and wants to see how much she can do      Exercises      General Comments General comments (skin integrity, edema, etc.): VSS with SpO2 in 90s depsite pt reports of fatigue, reports 8/10 fatigue following 150 ft ambulation with RW.      Pertinent Vitals/Pain Pain Assessment: Faces Faces Pain Scale: Hurts a little bit Pain Location: back Pain Descriptors / Indicators:  Aching;Grimacing Pain Intervention(s): Monitored during session;Repositioned    Home Living                      Prior Function            PT Goals (current goals can now be found in the care plan section) Acute Rehab PT Goals Patient Stated Goal: "to walk into my house" PT Goal Formulation: With patient Time For Goal Achievement: 07/15/19 Potential to Achieve Goals: Good Progress towards PT goals: Progressing toward goals    Frequency    Min 3X/week      PT Plan Current plan remains appropriate    Co-evaluation              AM-PAC PT "6 Clicks" Mobility   Outcome Measure  Help needed turning from your back to your side while in a flat bed without using bedrails?: None Help needed moving from lying on your back to sitting on the side of a flat bed without using bedrails?: A Little Help needed moving to and from a bed to a chair (including a wheelchair)?: A Little Help needed standing up from a chair using your arms (e.g., wheelchair or bedside chair)?: A Little Help needed to walk in hospital room?: A Little Help needed climbing 3-5 steps with a railing? : A Lot 6 Click Score: 18    End of Session Equipment Utilized During Treatment: Gait belt Activity Tolerance: Patient limited by fatigue;Patient tolerated treatment well Patient left: in chair;with call bell/phone within reach Nurse Communication: Mobility status PT Visit Diagnosis: Other abnormalities of gait and mobility (R26.89);Muscle weakness (generalized) (M62.81)     Time: [id number] PT Time Calculation (min) (ACUTE ONLY): 17 min  Charges:  $Gait Training: 8-22 mins                     Karma Ganja, PT, DPT   Acute Rehabilitation Department Pager #: [phone]   Otho Bellows 07/10/2019, 4:06 PM

## 2019-07-11 DIAGNOSIS — R001 Bradycardia, unspecified: Secondary | ICD-10-CM

## 2019-07-11 DIAGNOSIS — I5043 Acute on chronic combined systolic (congestive) and diastolic (congestive) heart failure: Secondary | ICD-10-CM | POA: Diagnosis not present

## 2019-07-11 LAB — GLUCOSE, CAPILLARY
Glucose-Capillary: 86 mg/dL (ref 70–99)
Glucose-Capillary: 84 mg/dL (ref 70–99)
Glucose-Capillary: 90 mg/dL (ref 70–99)
Glucose-Capillary: 116 mg/dL — ABNORMAL HIGH (ref 70–99)
Glucose-Capillary: 116 mg/dL — ABNORMAL HIGH (ref 70–99)

## 2019-07-11 LAB — URINE CULTURE: Culture: >=100000 — AB

## 2019-07-11 LAB — BASIC METABOLIC PANEL
Anion gap: 6 (ref 5–15)
BUN: 19 mg/dL (ref 8–23)
CO2: 32 mmol/L (ref 22–32)
Calcium: 8.5 mg/dL — ABNORMAL LOW (ref 8.9–10.3)
Chloride: 99 mmol/L (ref 98–111)
Creatinine, Ser: 1.07 mg/dL — ABNORMAL HIGH (ref 0.44–1.00)
GFR calc Af Amer: >60 mL/min (ref >60)
GFR calc non Af Amer: 54 mL/min — ABNORMAL LOW (ref >60)
Glucose, Bld: 92 mg/dL (ref 70–99)
Potassium: 4.1 mmol/L (ref 3.5–5.1)
Sodium: 137 mmol/L (ref 135–145)

## 2019-07-11 LAB — CBC WITH DIFFERENTIAL/PLATELET
Abs Immature Granulocytes: 0.04 10*3/uL (ref 0.00–0.07)
Basophils Absolute: 0.1 10*3/uL (ref 0.0–0.1)
Basophils Relative: 1 %
Eosinophils Absolute: 0.2 10*3/uL (ref 0.0–0.5)
Eosinophils Relative: 4 %
HCT: 33 % — ABNORMAL LOW (ref 36.0–46.0)
Hemoglobin: 10.1 g/dL — ABNORMAL LOW (ref 12.0–15.0)
Immature Granulocytes: 1 %
Lymphocytes Relative: 19 %
Lymphs Abs: 0.8 10*3/uL (ref 0.7–4.0)
MCH: 28.8 pg (ref 26.0–34.0)
MCHC: 30.6 g/dL (ref 30.0–36.0)
MCV: 94 fL (ref 80.0–100.0)
Monocytes Absolute: 0.4 10*3/uL (ref 0.1–1.0)
Monocytes Relative: 8 %
Neutro Abs: 3 10*3/uL (ref 1.7–7.7)
Neutrophils Relative %: 67 %
Platelets: 195 10*3/uL (ref 150–400)
RBC: 3.51 MIL/uL — ABNORMAL LOW (ref 3.87–5.11)
RDW: 16.6 % — ABNORMAL HIGH (ref 11.5–15.5)
WBC: 4.4 10*3/uL (ref 4.0–10.5)
nRBC: 0 % (ref 0.0–0.2)

## 2019-07-11 LAB — MAGNESIUM: Magnesium: 1.7 mg/dL (ref 1.7–2.4)

## 2019-07-11 MED ORDER — OXYCODONE HCL 5 MG PO TABS
10.0000 mg | ORAL_TABLET | Freq: Three times a day (TID) | ORAL | Status: DC
  Administered 2019-07-11 – 2019-07-12 (×4): 10 mg via ORAL
  Filled 2019-07-11 (×4): qty 2

## 2019-07-11 MED ORDER — MAGNESIUM SULFATE 2 GM/50ML IV SOLN
2.0000 g | Freq: Once | INTRAVENOUS | Status: AC
  Administered 2019-07-11: 2 g via INTRAVENOUS
  Filled 2019-07-11: qty 50

## 2019-07-11 MED ORDER — INSULIN ASPART 100 UNIT/ML ~~LOC~~ SOLN: 0.0000 [IU] | Freq: Three times a day (TID) | SUBCUTANEOUS | Status: DC

## 2019-07-11 NOTE — Progress Notes (Signed)
Patient ID: Wendy [name], female   DOB: 1950-06-20, 69 y.o.   MRN: [id number] P    Advanced Heart Failure Rounding Note  PCP-Cardiologist: [name], MD   Subjective:    - 4/13 aortobifemoral bypass - Impella 5.5 placement 4/19 for cardiogenic shock.  - Extubated 4/20 - LHC 4/22  (left brachial access): All grafts occluded (known), totally occluded RCA, OM1, OM3, 80% dLCx, 60% pLAD.  No change, no intervention.  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   Junctional bradycardia on telemetry yesterday, stopped digoxin and cut back amiodarone.  Overnight and today, only NSR in 70s.   Creatinine lower at 1.07.    No complaints.   Objective:   Weight Range: 62.5 kg Body mass index is 26.04 kg/m.   Vital Signs:   Temp:  [97.3 F (36.3 C)-98.4 F (36.9 C)] 97.8 F (36.6 C) (05/06 0812) Pulse Rate:  [64-71] 71 (05/06 0812) Resp:  [10-15] 14 (05/06 0812) BP: (92-115)/(48-65) 102/48 (05/06 0812) SpO2:  [91 %-100 %] 96 % (05/06 0812) Last BM Date: 07/10/19  Weight change: Filed Weights   07/08/19 0400 07/09/19 0500 07/10/19 0535  Weight: 67.4 kg 61.8 kg 62.5 kg    Intake/Output:   Intake/Output Summary (Last 24 hours) at 07/11/2019 [id number] Last data filed at 07/10/2019 1905 Gross per 24 hour  Intake --  Output 200 ml  Net -200 ml      Physical Exam   General: NAD Neck: No JVD, no thyromegaly or thyroid nodule.  Lungs: Clear to auscultation bilaterally with normal respiratory effort. CV: Nondisplaced PMI.  Heart regular S1/S2, no S3/S4, no murmur.  No peripheral edema.  N Abdomen: Soft, nontender, no hepatosplenomegaly, no distention.  Skin: Intact without lesions or rashes.  Neurologic: Alert and oriented x 3.  Psych: Normal affect. Extremities: No clubbing or cyanosis.  HEENT: Normal.   Telemetry   NSR low 70s   personally reviewed.   Labs    CBC Recent Labs    07/10/19 0435 07/11/19 0222  WBC 4.8 4.4  NEUTROABS 3.3 3.0  HGB 10.5* 10.1*  HCT 33.9* 33.0*  MCV 95.2 94.0  PLT 233 0000000   Basic Metabolic Panel Recent Labs    07/10/19 0435 07/11/19 0222  NA 138 137  K 4.2 4.1  CL 95* 99  CO2 32 32  GLUCOSE 94 92  BUN 15 19  CREATININE 1.25* 1.07*  CALCIUM 9.0 8.5*  MG 1.9 1.7   Liver Function Tests Recent Labs    07/08/19 1141  AST 20  ALT 15  ALKPHOS 99  BILITOT 1.2  PROT 6.6  ALBUMIN 2.9*   No results for input(s): LIPASE, AMYLASE in the last 72 hours. Cardiac Enzymes No results for input(s): CKTOTAL, CKMB, CKMBINDEX, TROPONINI in the last 72 hours.  BNP: BNP (last 3 results) Recent Labs    06/19/19 1239  BNP 366.7*    ProBNP (last 3 results) No results for input(s): PROBNP in the last 8760 hours.   D-Dimer No results for input(s): DDIMER in the last 72 hours. Hemoglobin A1C No results for input(s): HGBA1C in the last 72 hours. Fasting Lipid Panel No results for input(s): CHOL, HDL, LDLCALC, TRIG, CHOLHDL, LDLDIRECT in the last 72 hours. Thyroid Function Tests No results for input(s): TSH, T4TOTAL, T3FREE, THYROIDAB in the last 72 hours.  Invalid input(s): FREET3  Other results:   Imaging    No results found.   Medications:     Scheduled Medications: . amiodarone  100 mg Oral Daily  . apixaban  5 mg Oral BID  . aspirin  81 mg Oral Daily  . atorvastatin  80 mg Oral QPC supper  . buPROPion  150 mg Oral Daily  . Chlorhexidine Gluconate Cloth  6 each Topical Daily  . ezetimibe  10 mg Oral Daily  . furosemide  20 mg Oral Daily  . insulin aspart  0-9 Units Subcutaneous Q4H  . LORazepam  0.5 mg Intravenous Once  . losartan  25 mg Oral Daily  . multivitamin with minerals  1 tablet Oral Daily  . oxyCODONE  10 mg Oral Q12H  . pantoprazole  40 mg Oral Daily  . pramipexole  0.25 mg Oral QHS  . sertraline  150 mg Oral QHS  . sodium chloride  flush  3 mL Intravenous Q12H  . spironolactone  25 mg Oral Daily    Infusions: . sodium chloride    . cefTRIAXone (ROCEPHIN)  IV 1 g (07/10/19 1027)    PRN Medications: sodium chloride, acetaminophen **OR** acetaminophen, ALPRAZolam, alum & mag hydroxide-simeth, diphenhydrAMINE **OR** diphenhydrAMINE, docusate sodium, fentaNYL (SUBLIMAZE) injection, gabapentin, guaiFENesin-dextromethorphan, metoprolol tartrate, nitroGLYCERIN, ondansetron, phenol, polyethylene glycol   Assessment/Plan   1. Cardiogenic shock: Progressive worsening 4/19 culminating in PEA arrest.  Lactate 8.1.  She had episodes of hypotension and atrial fibrillation/RVR earlier in her post-op course as well.  Symptoms were dyspnea and anxiety no chest pain.  No STEMI on ECG but concern for possible coronary event based on history.  Initial troponin 1802 => 2957.  Initially on NE, epi then Impella 5.5 placed on 4/19.  Now MAP is stable and off pressors. Impella 5.5 out 4/23.  - Resolved.  2. Acute systolic CHF: Suspect ischemic cardiomyopathy.  Echo 06/24/19 EF 20-25%, diffuse hypokinesis, preserved RV function.  Pre-op echo 06/17/19 EF 60%.  HS-TnI 1802 => 2957, elevated but not markedly high.  [place] 4/22 showed unchanged coronary anatomy, suspect patient got ischemic with hypotension.  Impella 5.5 out 4/23.  She is not volume overloaded on exam, creatinine lower today.  - Continue lasix 20 mg po daily.  - Off entresto 4/25 due hypotension.  - Continue Losartan 25 mg daily - Off digoxin with junctional bradycardia.   - Continue spironolactone 25 mg daily  - Tried on Coreg this admit, but also discontinued due to bradycardia/fatigue  3. AKI: Creatinine as high as 2.86, resolved. 1.07 today.     4. CAD: LHC in 3/21 showed occluded SVG-RCA and SVG-OM and atretic LIMA-LAD.  The LAD was patent with 60% mid vessel stenosis, the RCA was occluded with collaterals, and OM1 and OM2 were occluded.  No intervention.  Surprisingly, EF reported  as normal pre-op.  No chest pain but had dyspnea and anxiety today.  ECG did not show STEMI, showed fairly stable lateral TWIs.  HS-TnI to 2957. [place] 4/22 showed unchanged coronary anatomy, suspect she was ischemic with hypotension.  No interventional target.   - Continue statin, ASA for now. Will eventually stop ASA as she will need long-term oral anticoagulation (Eliquis) unless vascular wants an antiplatelet agent post-aortobifemoral bypass.  5. PAD: S/p aortobifemoral bypass on 4/13.  Surgical site is stable, she has had palpable PT pulses.  6. Acute hypoxemic respiratory failure: Intubated with PEA arrest. Resolved, extubated.  Chest still sore from CPR.  -Stable on room air.  7. Atrial fibrillation:  Paroxysmal.  She has has been in and out of atrial fibrillation post-op.  Has had episodes of junctional rhythm also.  Maintaining NSR currently.   - Continue amio 100 mg once a day. - On eliquis 5 mg twice a day. Hgb stable.  8. Thrombocytopenia: Post-Impella placement.  - Resolved.  9. Anemia, post-op: hgb 7.6 -> 7.8->7.5 -> 6.3 -> transfused 2 units -> 10 -> 10.->11. Ecchymosis left brachial site.  10. Deconditioning - Continue PT/OT - Planning SNF. 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  12. UTI: E coli, on ceftriaxone.   13. Junctional Bradycardia: rates in the 40s-50s but not highly symptomatic. NSR today.  - Now off digoxin and Coreg, on lower dose of amiodarone.  14. Disposition: To SNF.  Will arrange cardiology followup.  Cardiac meds for home: Eliquis 5 mg bid, losartan 25 mg daily, spironolactone 25 mg daily, amiodarone 100 mg daily, atorvastatin 80 mg daily, Lasix 20 mg daily.  Will need to complete UTI treatment.   Length of Stay: 80  Loralie Champagne, MD  07/11/2019, 9:38 AM  Advanced Heart Failure Team Pager [phone] (M-F; 7a - 4p)  Please contact [facility] for night-coverage after hours (4p  -7a ) and weekends on [email or web address]

## 2019-07-11 NOTE — Plan of Care (Signed)
Continue to monitor

## 2019-07-11 NOTE — TOC Progression Note (Signed)
Transition of Care [facility]) - Progression Note    Patient Details  Name: Wendy [name] MRN: [id number] Date of Birth: 29-Feb-1951  [facility] CM/SW [place], [place] Phone Number: 07/11/2019, 2:26 PM  Clinical Narrative:     Per SNF  insurance is still pending- SNF sent more clinicals today.  Thurmond Butts, MSW, [place] Clinical Social Worker   Expected Discharge Plan: [place] Barriers to Discharge: Continued Medical Work up  Expected Discharge Plan and Services Expected Discharge Plan: [place] In-house Referral: Clinical Social Work                                             Social Determinants of Health (SDOH) Interventions    Readmission Risk Interventions No flowsheet data found.

## 2019-07-11 NOTE — Progress Notes (Addendum)
[facility]  Subjective  - Slowly making progress.  Had 2 BMs yesterday and ambulated with PT.   Objective (!) 104/55 71 98.4 F (36.9 C) (Oral) 12 100%  Intake/Output Summary (Last 24 hours) at 07/11/2019 0726 Last data filed at 07/10/2019 1905 Gross per 24 hour  Intake --  Output 200 ml  Net -200 ml    Incisions on abdomin and let UE are healing well Right left arm hemtomas stable not expanding. Palpable DP pulse intact B LE. Heart RRR Lungs non labored breathing  Assessment/Planning: 69 y.o. female is s/p Aortobifemoral bypass4/13with subsequent PEA arrest. 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 11 Days Post-Op   Well perfused B LE and B UE with palpable pulses Tolerating diet Currently on Eliquis CAD with history of A fib Now on ceftriaxone for UTI. Encourage mobility and good nutrition Pending SNF  Roxy Horseman 07/11/2019 7:26 AM --  Laboratory Lab Results: Recent Labs    07/10/19 0435 07/11/19 0222  WBC 4.8 4.4  HGB 10.5* 10.1*  HCT 33.9* 33.0*  PLT 233 195   BMET Recent Labs    07/10/19 0435 07/11/19 0222  NA 138 137  K 4.2 4.1  CL 95* 99  CO2 32 32  GLUCOSE 94 92  BUN 15 19  CREATININE 1.25* 1.07*  CALCIUM 9.0 8.5*    COAG Lab Results  Component Value Date   INR 1.8 (H) 06/24/2019   INR 1.4 (H) 06/18/2019   INR 1.6 (H) 06/14/2019   No results found for: PTT  I have independently interviewed and examined patient and agree with PA assessment and plan above. Heart rate and symptoms improved. Pending snf  Carloyn Lahue C. Donzetta Matters, MD [facility] [place] Office: [phone] Pager: [phone]

## 2019-07-12 DIAGNOSIS — R5381 Other malaise: Secondary | ICD-10-CM | POA: Diagnosis not present

## 2019-07-12 DIAGNOSIS — G8929 Other chronic pain: Secondary | ICD-10-CM | POA: Diagnosis not present

## 2019-07-12 DIAGNOSIS — Z48812 Encounter for surgical aftercare following surgery on the circulatory system: Secondary | ICD-10-CM | POA: Diagnosis not present

## 2019-07-12 DIAGNOSIS — F29 Unspecified psychosis not due to a substance or known physiological condition: Secondary | ICD-10-CM | POA: Diagnosis not present

## 2019-07-12 DIAGNOSIS — I480 Paroxysmal atrial fibrillation: Secondary | ICD-10-CM | POA: Diagnosis not present

## 2019-07-12 DIAGNOSIS — I10 Essential (primary) hypertension: Secondary | ICD-10-CM | POA: Diagnosis not present

## 2019-07-12 DIAGNOSIS — R279 Unspecified lack of coordination: Secondary | ICD-10-CM | POA: Diagnosis not present

## 2019-07-12 DIAGNOSIS — G629 Polyneuropathy, unspecified: Secondary | ICD-10-CM | POA: Diagnosis not present

## 2019-07-12 DIAGNOSIS — J449 Chronic obstructive pulmonary disease, unspecified: Secondary | ICD-10-CM | POA: Diagnosis not present

## 2019-07-12 DIAGNOSIS — M1611 Unilateral primary osteoarthritis, right hip: Secondary | ICD-10-CM | POA: Diagnosis not present

## 2019-07-12 DIAGNOSIS — Z743 Need for continuous supervision: Secondary | ICD-10-CM | POA: Diagnosis not present

## 2019-07-12 DIAGNOSIS — I2510 Atherosclerotic heart disease of native coronary artery without angina pectoris: Secondary | ICD-10-CM | POA: Diagnosis not present

## 2019-07-12 DIAGNOSIS — I739 Peripheral vascular disease, unspecified: Secondary | ICD-10-CM | POA: Diagnosis not present

## 2019-07-12 LAB — BASIC METABOLIC PANEL
Anion gap: 8 (ref 5–15)
BUN: 21 mg/dL (ref 8–23)
CO2: 31 mmol/L (ref 22–32)
Calcium: 8.4 mg/dL — ABNORMAL LOW (ref 8.9–10.3)
Chloride: 100 mmol/L (ref 98–111)
Creatinine, Ser: 1.22 mg/dL — ABNORMAL HIGH (ref 0.44–1.00)
GFR calc Af Amer: 53 mL/min — ABNORMAL LOW (ref >60)
GFR calc non Af Amer: 46 mL/min — ABNORMAL LOW (ref >60)
Glucose, Bld: 117 mg/dL — ABNORMAL HIGH (ref 70–99)
Potassium: 4.2 mmol/L (ref 3.5–5.1)
Sodium: 139 mmol/L (ref 135–145)

## 2019-07-12 LAB — CBC WITH DIFFERENTIAL/PLATELET
Abs Immature Granulocytes: 0.04 10*3/uL (ref 0.00–0.07)
Basophils Absolute: 0.1 10*3/uL (ref 0.0–0.1)
Basophils Relative: 1 %
Eosinophils Absolute: 0.2 10*3/uL (ref 0.0–0.5)
Eosinophils Relative: 6 %
HCT: 32.3 % — ABNORMAL LOW (ref 36.0–46.0)
Hemoglobin: 10 g/dL — ABNORMAL LOW (ref 12.0–15.0)
Immature Granulocytes: 1 %
Lymphocytes Relative: 19 %
Lymphs Abs: 0.7 10*3/uL (ref 0.7–4.0)
MCH: 29.6 pg (ref 26.0–34.0)
MCHC: 31 g/dL (ref 30.0–36.0)
MCV: 95.6 fL (ref 80.0–100.0)
Monocytes Absolute: 0.3 10*3/uL (ref 0.1–1.0)
Monocytes Relative: 9 %
Neutro Abs: 2.5 10*3/uL (ref 1.7–7.7)
Neutrophils Relative %: 64 %
Platelets: 208 10*3/uL (ref 150–400)
RBC: 3.38 MIL/uL — ABNORMAL LOW (ref 3.87–5.11)
RDW: 16.8 % — ABNORMAL HIGH (ref 11.5–15.5)
WBC: 3.8 10*3/uL — ABNORMAL LOW (ref 4.0–10.5)
nRBC: 0 % (ref 0.0–0.2)

## 2019-07-12 LAB — GLUCOSE, CAPILLARY
Glucose-Capillary: 74 mg/dL (ref 70–99)
Glucose-Capillary: 93 mg/dL (ref 70–99)
Glucose-Capillary: 98 mg/dL (ref 70–99)

## 2019-07-12 LAB — MAGNESIUM: Magnesium: 2.5 mg/dL — ABNORMAL HIGH (ref 1.7–2.4)

## 2019-07-12 MED ORDER — SPIRONOLACTONE 25 MG PO TABS: 25.0000 mg | ORAL_TABLET | Freq: Every day | ORAL | 0 refills | Status: DC

## 2019-07-12 MED ORDER — OXYCODONE HCL 10 MG PO TABS: 10.0000 mg | ORAL_TABLET | Freq: Three times a day (TID) | ORAL | 0 refills | Status: DC

## 2019-07-12 MED ORDER — FUROSEMIDE 20 MG PO TABS: 20.0000 mg | ORAL_TABLET | Freq: Every day | ORAL | 0 refills | Status: DC

## 2019-07-12 MED ORDER — LOSARTAN POTASSIUM 25 MG PO TABS: 25.0000 mg | ORAL_TABLET | Freq: Every day | ORAL | 0 refills | Status: DC

## 2019-07-12 MED ORDER — AMIODARONE HCL 100 MG PO TABS: 100.0000 mg | ORAL_TABLET | Freq: Every day | ORAL | 1 refills | Status: DC

## 2019-07-12 NOTE — Progress Notes (Signed)
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 PMHx: L to R femorofemoral bypass. PMHx: COPD, CAD, HTN, past MI, PVDx, cardiac stents.    PT Comments    Pt making steady progress. Continue to recommend ST-SNF prior to return home alone.    Follow Up Recommendations  SNF;Supervision/Assistance - 24 hour     Equipment Recommendations  3in1 (PT)    Recommendations for Other Services       Precautions / Restrictions Precautions Precautions: Fall    Mobility  Bed Mobility Overal bed mobility: Needs Assistance Bed Mobility: Sit to Supine       Sit to supine: Min guard;HOB elevated   General bed mobility comments: Incr time and effort  Transfers Overall transfer level: Needs assistance Equipment used: 4-wheeled walker Transfers: Sit to/from Stand Sit to Stand: Min guard         General transfer comment: Assist for safety and lines  Ambulation/Gait Ambulation/Gait assistance: Min guard Gait Distance (Feet): 80 Feet(x 2) Assistive device: 4-wheeled walker Gait Pattern/deviations: Step-through pattern;Decreased stride length Gait velocity: decr Gait velocity interpretation: <1.31 ft/sec, indicative of household ambulator General Gait Details: Assist for balance and safety. One sitting rest break on rollator   Stairs             Wheelchair Mobility    Modified Rankin (Stroke Patients Only)       Balance Overall balance assessment: Needs assistance Sitting-balance support: No upper extremity supported;Feet supported Sitting balance-Leahy Scale: Fair     Standing balance support: Bilateral upper extremity supported;During functional  activity Standing balance-Leahy Scale: Poor Standing balance comment: rollator and min guard for static standing                            Cognition Arousal/Alertness: Awake/alert Behavior During Therapy: WFL for tasks assessed/performed Overall Cognitive Status: Within Functional Limits for tasks assessed                                        Exercises      General Comments        Pertinent Vitals/Pain Pain Assessment: 0-10 Pain Score: 7  Pain Location: back Pain Descriptors / Indicators: Aching;Grimacing Pain Intervention(s): Monitored during session;Limited activity within patient's tolerance    Home Living                      Prior Function            PT Goals (current goals can now be found in the care plan section) Acute Rehab PT Goals Patient Stated Goal: go home Progress towards PT goals: Progressing toward goals    Frequency    Min 2X/week      PT Plan Current plan remains appropriate;Frequency needs to be updated    Co-evaluation              AM-PAC PT "6 Clicks" Mobility   Outcome Measure  Help needed turning from your back to your side while in a flat bed without using bedrails?: None Help needed moving from lying on your back  to sitting on the side of a flat bed without using bedrails?: A Little Help needed moving to and from a bed to a chair (including a wheelchair)?: A Little Help needed standing up from a chair using your arms (e.g., wheelchair or bedside chair)?: A Little Help needed to walk in hospital room?: A Little Help needed climbing 3-5 steps with a railing? : A Lot 6 Click Score: 18    End of Session Equipment Utilized During Treatment: Gait belt Activity Tolerance: Patient tolerated treatment well Patient left: with call bell/phone within reach;in bed Nurse Communication: Mobility status PT Visit Diagnosis: Other abnormalities of gait and mobility (R26.89);Muscle weakness  (generalized) (M62.81)     Time: [id number] PT Time Calculation (min) (ACUTE ONLY): 18 min  Charges:  $Gait Training: 8-22 mins                     [place] Pager [phone] Office [place] 07/12/2019, 10:30 AM

## 2019-07-12 NOTE — Progress Notes (Addendum)
[facility] [place]  Subjective  - Rested well last night.   Objective 118/80 70 97.6 F (36.4 C) (Oral) 14 96%  Intake/Output Summary (Last 24 hours) at 07/12/2019 0727 Last data filed at 07/11/2019 2325 Gross per 24 hour  Intake 850 ml  Output 300 ml  Net 550 ml   Right and left UE without change in size of hematoma.  Somewhat softer to palpation. Abdominal incision healing well, abdomin soft B LE palpable DP pulses Moving all 4 ext Lungs non labored on RA   Assessment/Planning: 69 y.o.femaleis s/p Aortobifemoral bypass4/13with subsequent PEA arrest. 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.12 Days Post-Op   Encouraged mobility Pending SNF placement for rehab  Roxy Horseman 07/12/2019 7:27 AM --  Laboratory Lab Results: Recent Labs    07/11/19 0222 07/12/19 0259  WBC 4.4 3.8*  HGB 10.1* 10.0*  HCT 33.0* 32.3*  PLT 195 208   BMET Recent Labs    07/11/19 0222 07/12/19 0259  NA 137 139  K 4.1 4.2  CL 99 100  CO2 32 31  GLUCOSE 92 117*  BUN 19 21  CREATININE 1.07* 1.22*  CALCIUM 8.5* 8.4*    COAG Lab Results  Component Value Date   INR 1.8 (H) 06/24/2019   INR 1.4 (H) 06/18/2019   INR 1.6 (H) 06/14/2019   No results found for: PTT  I have independently interviewed and examined patient and agree with PA assessment and plan above.   Bridgid Printz C. Donzetta Matters, MD [facility] [place] Office: [phone] Pager: [phone]

## 2019-07-12 NOTE — TOC Transition Note (Signed)
Transition of Care [facility]) - CM/SW Discharge Note   Patient Details  Name: ANGY CLENDENNING MRN: [id number] Date of Birth: [birth year]  Transition of Care [facility]) CM/SW Contact:  Vinie Sill, [place] Phone Number: 07/12/2019, 1:42 PM   Clinical Narrative:      Patient will DC to: [facility] Date: 07/12/2019 Family Notified: [name]. sister Transport By: Corey Harold @ 4pm   Per MD patient is medically ready for discharge. RN, patient, and facility notified of DC. Discharge Summary sent to facility. RN given number for [phone]. Ambulance transport requested for patient.   Clinical Social Worker signing off.  Thurmond Butts, MSW, [place] Clinical Social Worker     Final next level of care: Skilled Nursing Facility Barriers to Discharge: Barriers Resolved   Patient Goals and CMS Choice        Discharge Placement              Patient chooses bed at: [facility] Patient to be transferred to facility by: [place] Name of family member notified: Angela,sister Patient and family notified of of transfer: 07/12/19  Discharge Plan and Services In-house Referral: Clinical Social Work                                   Social Determinants of Health (SDOH) Interventions     Readmission Risk Interventions No flowsheet data found.

## 2019-07-12 NOTE — Progress Notes (Signed)
Patient ID: Wendy [name], female   DOB: 20-Jul-1950, 69 y.o.   MRN: [id number]  Chart reviewed, appears clinically stable.  See note from 5/6 for home cardiac med regimen.   Will follow at a distance unless called.   Loralie Champagne 07/12/2019

## 2019-07-16 ENCOUNTER — Encounter (HOSPITAL_COMMUNITY): Payer: Medicare HMO

## 2019-07-16 DIAGNOSIS — I25810 Atherosclerosis of coronary artery bypass graft(s) without angina pectoris: Secondary | ICD-10-CM | POA: Diagnosis not present

## 2019-07-16 DIAGNOSIS — F322 Major depressive disorder, single episode, severe without psychotic features: Secondary | ICD-10-CM | POA: Diagnosis not present

## 2019-07-16 DIAGNOSIS — J449 Chronic obstructive pulmonary disease, unspecified: Secondary | ICD-10-CM | POA: Diagnosis not present

## 2019-07-16 DIAGNOSIS — I208 Other forms of angina pectoris: Secondary | ICD-10-CM | POA: Diagnosis not present

## 2019-07-16 DIAGNOSIS — E785 Hyperlipidemia, unspecified: Secondary | ICD-10-CM | POA: Diagnosis not present

## 2019-07-16 DIAGNOSIS — I10 Essential (primary) hypertension: Secondary | ICD-10-CM | POA: Diagnosis not present

## 2019-07-16 DIAGNOSIS — I480 Paroxysmal atrial fibrillation: Secondary | ICD-10-CM | POA: Diagnosis not present

## 2019-07-21 ENCOUNTER — Encounter (HOSPITAL_COMMUNITY): Payer: Self-pay | Admitting: Emergency Medicine

## 2019-07-21 ENCOUNTER — Inpatient Hospital Stay (HOSPITAL_COMMUNITY): Payer: Medicare HMO

## 2019-07-21 ENCOUNTER — Inpatient Hospital Stay (HOSPITAL_COMMUNITY)
Admission: EM | Admit: 2019-07-21 | Discharge: 2019-07-23 | DRG: 920 | Disposition: A | Discharge status: Home Health | Payer: Medicare HMO | Attending: Vascular Surgery | Admitting: Vascular Surgery

## 2019-07-21 ENCOUNTER — Other Ambulatory Visit: Payer: Self-pay

## 2019-07-21 ENCOUNTER — Emergency Department (HOSPITAL_COMMUNITY): Payer: Medicare HMO

## 2019-07-21 DIAGNOSIS — M1990 Unspecified osteoarthritis, unspecified site: Secondary | ICD-10-CM | POA: Diagnosis present

## 2019-07-21 DIAGNOSIS — Z20822 Contact with and (suspected) exposure to covid-19: Secondary | ICD-10-CM | POA: Diagnosis not present

## 2019-07-21 DIAGNOSIS — K219 Gastro-esophageal reflux disease without esophagitis: Secondary | ICD-10-CM | POA: Diagnosis present

## 2019-07-21 DIAGNOSIS — Z87891 Personal history of nicotine dependence: Secondary | ICD-10-CM

## 2019-07-21 DIAGNOSIS — E860 Dehydration: Secondary | ICD-10-CM | POA: Diagnosis present

## 2019-07-21 DIAGNOSIS — M549 Dorsalgia, unspecified: Secondary | ICD-10-CM | POA: Diagnosis not present

## 2019-07-21 DIAGNOSIS — R001 Bradycardia, unspecified: Secondary | ICD-10-CM | POA: Diagnosis not present

## 2019-07-21 DIAGNOSIS — N179 Acute kidney failure, unspecified: Secondary | ICD-10-CM | POA: Diagnosis present

## 2019-07-21 DIAGNOSIS — Z981 Arthrodesis status: Secondary | ICD-10-CM

## 2019-07-21 DIAGNOSIS — T148XXA Other injury of unspecified body region, initial encounter: Secondary | ICD-10-CM | POA: Diagnosis not present

## 2019-07-21 DIAGNOSIS — Z880 Allergy status to penicillin: Secondary | ICD-10-CM

## 2019-07-21 DIAGNOSIS — I959 Hypotension, unspecified: Secondary | ICD-10-CM | POA: Diagnosis not present

## 2019-07-21 DIAGNOSIS — Z7902 Long term (current) use of antithrombotics/antiplatelets: Secondary | ICD-10-CM

## 2019-07-21 DIAGNOSIS — F419 Anxiety disorder, unspecified: Secondary | ICD-10-CM | POA: Diagnosis present

## 2019-07-21 DIAGNOSIS — Z7982 Long term (current) use of aspirin: Secondary | ICD-10-CM

## 2019-07-21 DIAGNOSIS — I10 Essential (primary) hypertension: Secondary | ICD-10-CM | POA: Diagnosis present

## 2019-07-21 DIAGNOSIS — T8131XS Disruption of external operation (surgical) wound, not elsewhere classified, sequela: Secondary | ICD-10-CM

## 2019-07-21 DIAGNOSIS — Z832 Family history of diseases of the blood and blood-forming organs and certain disorders involving the immune mechanism: Secondary | ICD-10-CM

## 2019-07-21 DIAGNOSIS — M79606 Pain in leg, unspecified: Secondary | ICD-10-CM | POA: Diagnosis present

## 2019-07-21 DIAGNOSIS — Z03818 Encounter for observation for suspected exposure to other biological agents ruled out: Secondary | ICD-10-CM | POA: Diagnosis not present

## 2019-07-21 DIAGNOSIS — D509 Iron deficiency anemia, unspecified: Secondary | ICD-10-CM

## 2019-07-21 DIAGNOSIS — E861 Hypovolemia: Secondary | ICD-10-CM | POA: Diagnosis present

## 2019-07-21 DIAGNOSIS — Z86718 Personal history of other venous thrombosis and embolism: Secondary | ICD-10-CM

## 2019-07-21 DIAGNOSIS — J449 Chronic obstructive pulmonary disease, unspecified: Secondary | ICD-10-CM | POA: Diagnosis not present

## 2019-07-21 DIAGNOSIS — I252 Old myocardial infarction: Secondary | ICD-10-CM

## 2019-07-21 DIAGNOSIS — G8929 Other chronic pain: Secondary | ICD-10-CM | POA: Diagnosis present

## 2019-07-21 DIAGNOSIS — R58 Hemorrhage, not elsewhere classified: Secondary | ICD-10-CM | POA: Diagnosis not present

## 2019-07-21 DIAGNOSIS — K5900 Constipation, unspecified: Secondary | ICD-10-CM | POA: Diagnosis present

## 2019-07-21 DIAGNOSIS — I2510 Atherosclerotic heart disease of native coronary artery without angina pectoris: Secondary | ICD-10-CM | POA: Diagnosis not present

## 2019-07-21 DIAGNOSIS — G4733 Obstructive sleep apnea (adult) (pediatric): Secondary | ICD-10-CM | POA: Diagnosis present

## 2019-07-21 DIAGNOSIS — T8131XA Disruption of external operation (surgical) wound, not elsewhere classified, initial encounter: Principal | ICD-10-CM | POA: Diagnosis present

## 2019-07-21 DIAGNOSIS — D689 Coagulation defect, unspecified: Secondary | ICD-10-CM | POA: Diagnosis not present

## 2019-07-21 DIAGNOSIS — Z888 Allergy status to other drugs, medicaments and biological substances status: Secondary | ICD-10-CM

## 2019-07-21 DIAGNOSIS — R52 Pain, unspecified: Secondary | ICD-10-CM | POA: Diagnosis not present

## 2019-07-21 DIAGNOSIS — Z955 Presence of coronary angioplasty implant and graft: Secondary | ICD-10-CM

## 2019-07-21 DIAGNOSIS — F329 Major depressive disorder, single episode, unspecified: Secondary | ICD-10-CM | POA: Diagnosis present

## 2019-07-21 DIAGNOSIS — Z96641 Presence of right artificial hip joint: Secondary | ICD-10-CM | POA: Diagnosis present

## 2019-07-21 DIAGNOSIS — Z79899 Other long term (current) drug therapy: Secondary | ICD-10-CM

## 2019-07-21 DIAGNOSIS — Z79891 Long term (current) use of opiate analgesic: Secondary | ICD-10-CM

## 2019-07-21 DIAGNOSIS — I25118 Atherosclerotic heart disease of native coronary artery with other forms of angina pectoris: Secondary | ICD-10-CM | POA: Diagnosis not present

## 2019-07-21 DIAGNOSIS — E440 Moderate protein-calorie malnutrition: Secondary | ICD-10-CM | POA: Diagnosis not present

## 2019-07-21 DIAGNOSIS — E785 Hyperlipidemia, unspecified: Secondary | ICD-10-CM | POA: Diagnosis present

## 2019-07-21 DIAGNOSIS — Z7901 Long term (current) use of anticoagulants: Secondary | ICD-10-CM

## 2019-07-21 DIAGNOSIS — Z91048 Other nonmedicinal substance allergy status: Secondary | ICD-10-CM

## 2019-07-21 DIAGNOSIS — Z885 Allergy status to narcotic agent status: Secondary | ICD-10-CM

## 2019-07-21 DIAGNOSIS — Z8249 Family history of ischemic heart disease and other diseases of the circulatory system: Secondary | ICD-10-CM

## 2019-07-21 DIAGNOSIS — T8141XA Infection following a procedure, superficial incisional surgical site, initial encounter: Secondary | ICD-10-CM | POA: Diagnosis not present

## 2019-07-21 DIAGNOSIS — Z6825 Body mass index (BMI) 25.0-25.9, adult: Secondary | ICD-10-CM

## 2019-07-21 DIAGNOSIS — I739 Peripheral vascular disease, unspecified: Secondary | ICD-10-CM | POA: Diagnosis present

## 2019-07-21 DIAGNOSIS — Z951 Presence of aortocoronary bypass graft: Secondary | ICD-10-CM

## 2019-07-21 DIAGNOSIS — S299XXA Unspecified injury of thorax, initial encounter: Secondary | ICD-10-CM | POA: Diagnosis not present

## 2019-07-21 DIAGNOSIS — R570 Cardiogenic shock: Secondary | ICD-10-CM | POA: Diagnosis not present

## 2019-07-21 DIAGNOSIS — Z881 Allergy status to other antibiotic agents status: Secondary | ICD-10-CM

## 2019-07-21 LAB — COMPREHENSIVE METABOLIC PANEL
ALT: 13 U/L (ref 0–44)
AST: 16 U/L (ref 15–41)
Albumin: 2.9 g/dL — ABNORMAL LOW (ref 3.5–5.0)
Alkaline Phosphatase: 100 U/L (ref 38–126)
Anion gap: 15 (ref 5–15)
BUN: 32 mg/dL — ABNORMAL HIGH (ref 8–23)
CO2: 18 mmol/L — ABNORMAL LOW (ref 22–32)
Calcium: 8.5 mg/dL — ABNORMAL LOW (ref 8.9–10.3)
Chloride: 108 mmol/L (ref 98–111)
Creatinine, Ser: 1.88 mg/dL — ABNORMAL HIGH (ref 0.44–1.00)
GFR calc Af Amer: 31 mL/min — ABNORMAL LOW (ref >60)
GFR calc non Af Amer: 27 mL/min — ABNORMAL LOW (ref >60)
Glucose, Bld: 78 mg/dL (ref 70–99)
Potassium: 4.1 mmol/L (ref 3.5–5.1)
Sodium: 141 mmol/L (ref 135–145)
Total Bilirubin: 0.8 mg/dL (ref 0.3–1.2)
Total Protein: 6.2 g/dL — ABNORMAL LOW (ref 6.5–8.1)

## 2019-07-21 LAB — PROTIME-INR
INR: 3.3 — ABNORMAL HIGH (ref 0.8–1.2)
Prothrombin Time: 32.4 seconds — ABNORMAL HIGH (ref 11.4–15.2)

## 2019-07-21 LAB — CBC WITH DIFFERENTIAL/PLATELET
Abs Immature Granulocytes: 0.03 10*3/uL (ref 0.00–0.07)
Basophils Absolute: 0.1 10*3/uL (ref 0.0–0.1)
Basophils Relative: 1 %
Eosinophils Absolute: 0.1 10*3/uL (ref 0.0–0.5)
Eosinophils Relative: 3 %
HCT: 32.1 % — ABNORMAL LOW (ref 36.0–46.0)
Hemoglobin: 9.8 g/dL — ABNORMAL LOW (ref 12.0–15.0)
Immature Granulocytes: 1 %
Lymphocytes Relative: 18 %
Lymphs Abs: 0.7 10*3/uL (ref 0.7–4.0)
MCH: 29.9 pg (ref 26.0–34.0)
MCHC: 30.5 g/dL (ref 30.0–36.0)
MCV: 97.9 fL (ref 80.0–100.0)
Monocytes Absolute: 0.3 10*3/uL (ref 0.1–1.0)
Monocytes Relative: 6 %
Neutro Abs: 2.9 10*3/uL (ref 1.7–7.7)
Neutrophils Relative %: 71 %
Platelets: 198 10*3/uL (ref 150–400)
RBC: 3.28 MIL/uL — ABNORMAL LOW (ref 3.87–5.11)
RDW: 17.8 % — ABNORMAL HIGH (ref 11.5–15.5)
WBC: 4.1 10*3/uL (ref 4.0–10.5)
nRBC: 0 % (ref 0.0–0.2)

## 2019-07-21 LAB — SARS CORONAVIRUS 2 BY RT PCR (HOSPITAL ORDER, PERFORMED IN ~~LOC~~ HOSPITAL LAB): SARS Coronavirus 2: NEGATIVE

## 2019-07-21 LAB — TYPE AND SCREEN
ABO/RH(D): A POS
Antibody Screen: NEGATIVE

## 2019-07-21 LAB — MAGNESIUM: Magnesium: 1.7 mg/dL (ref 1.7–2.4)

## 2019-07-21 MED ORDER — POTASSIUM CHLORIDE CRYS ER 20 MEQ PO TBCR
20.0000 meq | EXTENDED_RELEASE_TABLET | Freq: Once | ORAL | Status: DC
  Filled 2019-07-21: qty 2

## 2019-07-21 MED ORDER — RANOLAZINE ER 500 MG PO TB12
1000.0000 mg | ORAL_TABLET | Freq: Two times a day (BID) | ORAL | Status: DC
  Administered 2019-07-21 – 2019-07-23 (×4): 1000 mg via ORAL
  Filled 2019-07-21 (×4): qty 2

## 2019-07-21 MED ORDER — NITROGLYCERIN 0.4 MG SL SUBL: 0.4000 mg | SUBLINGUAL_TABLET | SUBLINGUAL | Status: DC | PRN

## 2019-07-21 MED ORDER — APIXABAN 5 MG PO TABS
5.0000 mg | ORAL_TABLET | Freq: Two times a day (BID) | ORAL | Status: DC
  Administered 2019-07-21 – 2019-07-23 (×4): 5 mg via ORAL
  Filled 2019-07-21 (×4): qty 1

## 2019-07-21 MED ORDER — ACETAMINOPHEN 325 MG RE SUPP
325.0000 mg | RECTAL | Status: DC | PRN
  Filled 2019-07-21: qty 2

## 2019-07-21 MED ORDER — ALBUTEROL SULFATE (2.5 MG/3ML) 0.083% IN NEBU: 3.0000 mL | INHALATION_SOLUTION | Freq: Four times a day (QID) | RESPIRATORY_TRACT | Status: DC | PRN

## 2019-07-21 MED ORDER — ALPRAZOLAM 0.5 MG PO TABS
0.5000 mg | ORAL_TABLET | Freq: Three times a day (TID) | ORAL | Status: DC | PRN
  Administered 2019-07-22: 0.5 mg via ORAL
  Filled 2019-07-21: qty 1

## 2019-07-21 MED ORDER — CLOPIDOGREL BISULFATE 75 MG PO TABS
75.0000 mg | ORAL_TABLET | Freq: Every day | ORAL | Status: DC
  Administered 2019-07-22 – 2019-07-23 (×2): 75 mg via ORAL
  Filled 2019-07-21 (×2): qty 1

## 2019-07-21 MED ORDER — AMIODARONE HCL 200 MG PO TABS
100.0000 mg | ORAL_TABLET | Freq: Every day | ORAL | Status: DC
  Administered 2019-07-21 – 2019-07-23 (×3): 100 mg via ORAL
  Filled 2019-07-21 (×3): qty 1

## 2019-07-21 MED ORDER — ACETAMINOPHEN 325 MG PO TABS
325.0000 mg | ORAL_TABLET | ORAL | Status: DC | PRN
  Administered 2019-07-22 (×2): 650 mg via ORAL
  Filled 2019-07-21 (×2): qty 2

## 2019-07-21 MED ORDER — METOPROLOL TARTRATE 5 MG/5ML IV SOLN: 2.0000 mg | INTRAVENOUS | Status: DC | PRN

## 2019-07-21 MED ORDER — LABETALOL HCL 5 MG/ML IV SOLN: 10.0000 mg | INTRAVENOUS | Status: DC | PRN

## 2019-07-21 MED ORDER — HYDRALAZINE HCL 20 MG/ML IJ SOLN: 5.0000 mg | INTRAMUSCULAR | Status: DC | PRN

## 2019-07-21 MED ORDER — SPIRONOLACTONE 25 MG PO TABS
25.0000 mg | ORAL_TABLET | Freq: Every day | ORAL | Status: DC
  Administered 2019-07-22 – 2019-07-23 (×2): 25 mg via ORAL
  Filled 2019-07-21 (×2): qty 1

## 2019-07-21 MED ORDER — SERTRALINE HCL 50 MG PO TABS
150.0000 mg | ORAL_TABLET | Freq: Every day | ORAL | Status: DC
  Administered 2019-07-21 – 2019-07-22 (×2): 150 mg via ORAL
  Filled 2019-07-21 (×3): qty 1

## 2019-07-21 MED ORDER — ONDANSETRON HCL 4 MG/2ML IJ SOLN
4.0000 mg | Freq: Four times a day (QID) | INTRAMUSCULAR | Status: DC | PRN
  Administered 2019-07-22 (×2): 4 mg via INTRAVENOUS
  Filled 2019-07-21 (×2): qty 2

## 2019-07-21 MED ORDER — SODIUM CHLORIDE 0.9 % IV BOLUS
500.0000 mL | Freq: Once | INTRAVENOUS | Status: AC
  Administered 2019-07-21: 500 mL via INTRAVENOUS

## 2019-07-21 MED ORDER — ASPIRIN 81 MG PO CHEW
81.0000 mg | CHEWABLE_TABLET | Freq: Every day | ORAL | Status: DC
  Administered 2019-07-22 – 2019-07-23 (×2): 81 mg via ORAL
  Filled 2019-07-21 (×2): qty 1

## 2019-07-21 MED ORDER — ALUM & MAG HYDROXIDE-SIMETH 200-200-20 MG/5ML PO SUSP: 15.0000 mL | ORAL | Status: DC | PRN

## 2019-07-21 MED ORDER — FUROSEMIDE 20 MG PO TABS
20.0000 mg | ORAL_TABLET | Freq: Every day | ORAL | Status: DC
  Administered 2019-07-22 – 2019-07-23 (×2): 20 mg via ORAL
  Filled 2019-07-21 (×2): qty 1

## 2019-07-21 MED ORDER — PHENOL 1.4 % MT LIQD
1.0000 | OROMUCOSAL | Status: DC | PRN
  Filled 2019-07-21: qty 177

## 2019-07-21 MED ORDER — PANTOPRAZOLE SODIUM 40 MG PO TBEC
40.0000 mg | DELAYED_RELEASE_TABLET | Freq: Every day | ORAL | Status: DC
  Administered 2019-07-21 – 2019-07-23 (×3): 40 mg via ORAL
  Filled 2019-07-21 (×3): qty 1

## 2019-07-21 MED ORDER — GABAPENTIN 100 MG PO CAPS: 300.0000 mg | ORAL_CAPSULE | Freq: Three times a day (TID) | ORAL | Status: DC | PRN

## 2019-07-21 MED ORDER — DOCUSATE SODIUM 100 MG PO CAPS
100.0000 mg | ORAL_CAPSULE | Freq: Two times a day (BID) | ORAL | Status: DC
  Administered 2019-07-21 – 2019-07-23 (×4): 100 mg via ORAL
  Filled 2019-07-21 (×4): qty 1

## 2019-07-21 MED ORDER — LOSARTAN POTASSIUM 50 MG PO TABS
25.0000 mg | ORAL_TABLET | Freq: Every day | ORAL | Status: DC
  Administered 2019-07-22 – 2019-07-23 (×2): 25 mg via ORAL
  Filled 2019-07-21 (×2): qty 1

## 2019-07-21 MED ORDER — PRAMIPEXOLE DIHYDROCHLORIDE 0.25 MG PO TABS
0.2500 mg | ORAL_TABLET | Freq: Every day | ORAL | Status: DC
  Administered 2019-07-21 – 2019-07-22 (×2): 0.25 mg via ORAL
  Filled 2019-07-21 (×3): qty 1

## 2019-07-21 MED ORDER — BUPROPION HCL ER (XL) 150 MG PO TB24
150.0000 mg | ORAL_TABLET | Freq: Every day | ORAL | Status: DC
  Administered 2019-07-22 – 2019-07-23 (×2): 150 mg via ORAL
  Filled 2019-07-21 (×2): qty 1

## 2019-07-21 MED ORDER — OXYCODONE HCL 5 MG PO TABS
5.0000 mg | ORAL_TABLET | ORAL | Status: DC | PRN
  Administered 2019-07-21: 5 mg via ORAL
  Administered 2019-07-22 – 2019-07-23 (×4): 10 mg via ORAL
  Filled 2019-07-21 (×3): qty 2
  Filled 2019-07-21: qty 1
  Filled 2019-07-21: qty 2

## 2019-07-21 MED ORDER — ATORVASTATIN CALCIUM 80 MG PO TABS
80.0000 mg | ORAL_TABLET | Freq: Every day | ORAL | Status: DC
  Administered 2019-07-22: 80 mg via ORAL
  Filled 2019-07-21: qty 1

## 2019-07-21 MED ORDER — EZETIMIBE 10 MG PO TABS
10.0000 mg | ORAL_TABLET | Freq: Every day | ORAL | Status: DC
  Administered 2019-07-22 – 2019-07-23 (×2): 10 mg via ORAL
  Filled 2019-07-21 (×2): qty 1

## 2019-07-21 MED ORDER — GUAIFENESIN-DM 100-10 MG/5ML PO SYRP: 15.0000 mL | ORAL_SOLUTION | ORAL | Status: DC | PRN

## 2019-07-21 MED ORDER — SODIUM CHLORIDE 0.9 % IV SOLN: INTRAVENOUS | Status: DC

## 2019-07-21 MED ORDER — MORPHINE SULFATE (PF) 2 MG/ML IV SOLN
2.0000 mg | INTRAVENOUS | Status: DC | PRN
  Administered 2019-07-21 – 2019-07-23 (×3): 2 mg via INTRAVENOUS
  Filled 2019-07-21 (×3): qty 1

## 2019-07-21 NOTE — H&P (Signed)
Patient name: Wendy [name] MRN: [id number] DOB: Jan 13, 1951 Sex: female  HPI: Wendy [name] is a 69 y.o. female, who is status post aortobifemoral bypass by Dr. Donzetta Matters approximately 1 month ago.  She was lifting up her chair while wife today and noticed a pop in her abdomen.  She then began to drain bloody drainage.  She has not had any fever or chills.  She has had some constipation symptoms and her last bowel movement was 3 days ago.  She has not had any nausea or vomiting.  She states she has some occasional drainage from her groins but not as significant.  The drainage from her abdomen is bloody in character.  She has not reported any decrease in urinary output.  She also complains of weakness in her left hand.  During her prior hospitalization she had repair of her left brachial artery.  She does not complain of weakness or numbness in the right hand.  She did have exposure of the right axillary artery for an Impella during that hospitalization as well.  Other medical problems include coronary artery disease, chronic back pain, COPD, paroxysmal atrial fibrillation peripheral neuropathy.  All of these have been stable.  She is on Xarelto for atrial fibrillation.  Of note she is also on Plavix and aspirin.  Past Medical History:  Diagnosis Date  . Anemia   . Anginal pain ([place])    jaw pain 07/2016, s/p 07/15/16 nuclear stress test  . Anxiety   . Arthritis    "lower back" (08/24/2017)  . Asthma   . CAD (coronary artery disease), native coronary artery    a. Hx CABG 1998, last PCI 2013 b.LHC 07/2013:  EF 55-60%, L-LAD prob atretic, no sig disease in LAD, S-OM1 ok with patent stent, S-dRCA ok => med Rx. c. LHC 01/2015 DES to SVG-RCA.  Marland Kitchen Chronic back pain   . Chronic leg pain   . Chronic lower back pain   . COPD (chronic obstructive pulmonary disease) ([place])   . Depression   . DVT (deep venous thrombosis) ([facility]) X 3   RLE  . Fall 08/2016  . GERD (gastroesophageal reflux disease)   . Glucose  intolerance (impaired glucose tolerance)   . Headache    "a few/month" (08/24/2017)  . HLD (hyperlipidemia)   . Hypertension   . Myocardial infarction ([place]) 1999  . OSA on CPAP   . PAD (peripheral artery disease) ([place])   . PAF (paroxysmal atrial fibrillation) ([place])    a. 09/2013 post-op b. 01/2015  . Peripheral neuropathy   . Pneumonia   . Shortness of breath    when walking  . Wears glasses    Past Surgical History:  Procedure Laterality Date  . ABDOMINAL AORTOGRAM W/LOWER EXTREMITY Bilateral 04/29/2019   Procedure: ABDOMINAL AORTOGRAM W/LOWER EXTREMITY;  Surgeon: Waynetta Sandy, MD;  Location: [facility];  Service: Cardiovascular;  Laterality: Bilateral;  . ANTERIOR CERVICAL DECOMP/DISCECTOMY FUSION  1991  . AORTA - BILATERAL FEMORAL ARTERY BYPASS GRAFT N/A 06/18/2019   Procedure: AORTA BIFEMORAL BYPASS GRAFT;  Surgeon: Waynetta Sandy, MD;  Location: [facility];  Service: Vascular;  Laterality: N/A;  . ARTERY REPAIR Left 06/30/2019   Procedure: LEFT BRACHIAL ARTERY REPAIR, EVACUATION OF HEMATOMA;  Surgeon: Serafina Mitchell, MD;  Location: [facility];  Service: Vascular;  Laterality: Left;  . BACK SURGERY    . CARDIAC CATHETERIZATION N/A 01/22/2015   Procedure: Left Heart Cath and Coronary Angiography;  Surgeon: Troy Sine,  MD;  Location: [facility];  Service: Cardiovascular;  Laterality: N/A;  . CARDIAC CATHETERIZATION N/A 01/22/2015   Procedure: Coronary Stent Intervention;  Surgeon: Troy Sine, MD;  Location: [facility];  Service: Cardiovascular;  Laterality: N/A;  3.0x12 xience prox SVG to RCA  . CARDIAC CATHETERIZATION  03/09/2000   hx/notes 03/20/2000  . CHOLECYSTECTOMY N/A 09/16/2013   Procedure: LAPAROSCOPIC CHOLECYSTECTOMY CONVERTED TO OPEN CHOLECYSTECTOMY WITH CHOLANGIOGRAM;  Surgeon: [name], MD;  Location: [facility];  Service: General;  Laterality: N/A;  . CORONARY ANGIOGRAPHY N/A 06/27/2019   Procedure: CORONARY ANGIOGRAPHY  (CATH LAB);  Surgeon: Sherren Mocha, MD;  Location: [facility];  Service: Cardiovascular;  Laterality: N/A;  . CORONARY ANGIOPLASTY WITH STENT PLACEMENT  11/2011, 02/2012, 01/2015   DES to SVG-RCA both times, In [place], [place], Dr. Bruce Donath  . CORONARY ARTERY BYPASS GRAFT  1998   LIMA-LAD, SVG-OM, SVG-RCA  . ERCP N/A 09/19/2013   Procedure: ENDOSCOPIC RETROGRADE CHOLANGIOPANCREATOGRAPHY (ERCP);  Surgeon: [name], MD;  Location: [facility];  Service: Endoscopy;  Laterality: N/A;  . FINGER SURGERY Right    ring finger "fused"  . FRACTURE SURGERY    . IRRIGATION AND DEBRIDEMENT ABSCESS Right 10/17/2013   Procedure: INCISION AND DRAINAGE RIGHT SUBCOSTAL WOUND;  Surgeon: [name], MD;  Location: [facility];  Service: General;  Laterality: Right;  . JOINT REPLACEMENT    . KNEE ARTHROSCOPY Right 1998  . LEFT HEART CATH AND CORS/GRAFTS ANGIOGRAPHY N/A 07/25/2016   Procedure: Left Heart Cath and Cors/Grafts Angiography;  Surgeon: Jettie Booze, MD;  Location: [facility];  Service: Cardiovascular;  Laterality: N/A;  . LEFT HEART CATH AND CORS/GRAFTS ANGIOGRAPHY N/A 06/05/2019   Procedure: LEFT HEART CATH AND CORS/GRAFTS ANGIOGRAPHY;  Surgeon: Burnell Blanks, MD;  Location: [facility];  Service: Cardiovascular;  Laterality: N/A;  . LEFT HEART CATHETERIZATION WITH CORONARY/GRAFT ANGIOGRAM N/A 07/19/2013   Procedure: LEFT HEART CATHETERIZATION WITH Beatrix Fetters;  Surgeon: Leonie Man, MD;  [facility];  Service: Cardiovascular;  Laterality: N/A;  . MASS EXCISION Right 06/25/2014   Procedure: EXCISION BUTTOCK MASS ;  Surgeon: Coralie Keens, MD;  Location: [facility];  Service: General;  Laterality: Right;  . ORIF HUMERUS FRACTURE Left 08/27/2013   Procedure: OPEN REDUCTION INTERNAL FIXATION (ORIF) LEFT HUMERUS ;  Surgeon: Rozanna Box, MD;  Location: [facility];  Service: Orthopedics;  Laterality: Left;  . PLACEMENT OF  IMPELLA LEFT VENTRICULAR ASSIST DEVICE N/A 06/24/2019   Procedure: PLACEMENT OF IMPELLA LEFT VENTRICULAR ASSIST DEVICE;  Surgeon: Wonda Olds, MD;  Location: [facility];  Service: Open Heart Surgery;  Laterality: N/A;  . [place]  . REMOVAL OF IMPELLA LEFT VENTRICULAR ASSIST DEVICE Right 06/28/2019   Procedure: REMOVAL OF IMPELLA LEFT VENTRICULAR ASSIST DEVICE;  Surgeon: Wonda Olds, MD;  Location: [facility];  Service: Open Heart Surgery;  Laterality: Right;  . THROMBECTOMY / EMBOLECTOMY FEMORAL ARTERY Right    hx/notes 03/20/2000  . TOTAL HIP ARTHROPLASTY Right 07/19/2016   Procedure: TOTAL HIP ARTHROPLASTY ANTERIOR APPROACH;  Surgeon: [name], MD;  Location: [facility];  Service: Orthopedics;  Laterality: Right;  . TOTAL HIP REVISION Right 08/22/2017   Procedure: TOTAL HIP REVISION;  Surgeon: [name], MD;  Location: [facility];  Service: Orthopedics;  Laterality: Right;    Family History  Problem Relation Age of Onset  . Lung cancer Mother   .  Clotting disorder Father   . Lung cancer Sister   . Heart disease Brother   . Heart disease Brother   . Colon cancer Neg Hx   . Breast cancer Neg Hx     SOCIAL HISTORY: Social History   Socioeconomic History  . Marital status: Divorced    Spouse name: Not on file  . Number of children: 5  . Years of education: Not on file  . Highest education level: Not on file  Occupational History  . Occupation: Disabled  Tobacco Use  . Smoking status: Former Smoker    Packs/day: 0.50    Years: 34.00    Pack years: 17.00    Types: Cigarettes    Quit date: 07/20/1998    Years since quitting: 21.0  . Smokeless tobacco: Never Used  Substance and Sexual Activity  . Alcohol use: Not Currently  . Drug use: Yes    Types: Marijuana  . Sexual activity: Not Currently  Other Topics Concern  . Not on file  Social History Narrative   Lives with best friend.    Social Determinants of Health   Financial Resource  Strain:   . Difficulty of Paying Living Expenses:   Food Insecurity:   . Worried About Charity fundraiser in the Last Year:   . Arboriculturist in the Last Year:   Transportation Needs:   . Film/video editor (Medical):   Marland Kitchen Lack of Transportation (Non-Medical):   Physical Activity:   . Days of Exercise per Week:   . Minutes of Exercise per Session:   Stress:   . Feeling of Stress :   Social Connections:   . Frequency of Communication with Friends and Family:   . Frequency of Social Gatherings with Friends and Family:   . Attends Religious Services:   . Active Member of Clubs or Organizations:   . Attends Archivist Meetings:   Marland Kitchen Marital Status:   Intimate Partner Violence:   . Fear of Current or Ex-Partner:   . Emotionally Abused:   Marland Kitchen Physically Abused:   . Sexually Abused:     Allergies  Allergen Reactions  . Benadryl [Diphenhydramine] Shortness Of Breath  . Penicillins Diarrhea    Did it involve swelling of the face/tongue/throat, SOB, or low BP? No Did it involve sudden or severe rash/hives, skin peeling, or any reaction on the inside of your mouth or nose? No Did you need to seek medical attention at a hospital or doctor's office? No When did it last happen?2 months If all above answers are "NO", may proceed with cephalosporin use.  . Adhesive [Tape] Other (See Comments)    Burning of skin  . Amoxicillin Diarrhea and Other (See Comments)    Has patient had a PCN reaction causing immediate rash, facial/tongue/throat swelling, SOB or lightheadedness with hypotension: No Has patient had a PCN reaction causing severe rash involving mucus membranes or skin necrosis: No Has patient had a PCN reaction that required hospitalization No Has patient had a PCN reaction occurring within the last 10 years: Yes -- noted rxn as diarrhea If all of the above answers are "NO", then may proceed with Cephalosporin use.   . Cyclobenzaprine Other (See Comments)     Causes restless leg syndrome Restless syndrome  . Hydrocodone Itching    Current Facility-Administered Medications  Medication Dose Route Frequency Provider Last Rate Last Admin  . ferrous sulfate tablet 325 mg  325 mg Oral BID WC Irene Shipper,  MD       Current Outpatient Medications  Medication Sig Dispense Refill  . acetaminophen (TYLENOL) 500 MG tablet Take 500-1,000 mg by mouth every 6 (six) hours as needed (for pain.).    Marland Kitchen albuterol (PROVENTIL HFA;VENTOLIN HFA) 108 (90 Base) MCG/ACT inhaler Inhale 2 puffs into the lungs every 6 (six) hours as needed for wheezing or shortness of breath. 1 each 1  . ALPRAZolam (XANAX) 0.5 MG tablet Take 0.5 mg by mouth 3 (three) times daily as needed for anxiety.    Marland Kitchen amiodarone (PACERONE) 100 MG tablet Take 1 tablet (100 mg total) by mouth daily. 30 tablet 1  . apixaban (ELIQUIS) 5 MG TABS tablet Take 1 tablet (5 mg total) by mouth 2 (two) times daily. 180 tablet 1  . aspirin 81 MG chewable tablet Chew 1 tablet (81 mg total) by mouth daily.    Marland Kitchen atorvastatin (LIPITOR) 80 MG tablet TAKE ONE TABLET BY MOUTH DAILY AT 6PM (Patient taking differently: Take 80 mg by mouth daily at 6 PM. TAKE ONE TABLET BY MOUTH DAILY AT 6PM) 90 tablet 3  . buPROPion (WELLBUTRIN XL) 150 MG 24 hr tablet Take 150 mg by mouth daily.    . clopidogrel (PLAVIX) 75 MG tablet Take 75 mg by mouth daily.    . diclofenac Sodium (VOLTAREN) 1 % GEL Apply 1 g topically 4 (four) times daily as needed for pain.    Marland Kitchen ezetimibe (ZETIA) 10 MG tablet Take 10 mg by mouth daily.    . furosemide (LASIX) 20 MG tablet Take 1 tablet (20 mg total) by mouth daily. 30 tablet 0  . gabapentin (NEURONTIN) 300 MG capsule Take 300 mg by mouth 3 (three) times daily as needed for pain.    Marland Kitchen losartan (COZAAR) 25 MG tablet Take 1 tablet (25 mg total) by mouth daily. 30 tablet 0  . nitroGLYCERIN (NITROSTAT) 0.4 MG SL tablet Place 1 tablet (0.4 mg total) under the tongue every 5 (five) minutes as needed for chest  pain. 25 tablet 5  . oxyCODONE 10 MG TABS Take 1 tablet (10 mg total) by mouth 3 (three) times daily. 30 tablet 0  . pantoprazole (PROTONIX) 40 MG tablet Take 1 tablet (40 mg total) by mouth daily. 90 tablet 3  . pramipexole (MIRAPEX) 0.25 MG tablet Take 0.25 mg by mouth at bedtime.     . ranolazine (RANEXA) 1000 MG SR tablet Take 1,000 mg by mouth 2 (two) times daily.    . sertraline (ZOLOFT) 100 MG tablet Take 150 mg by mouth at bedtime.     Marland Kitchen spironolactone (ALDACTONE) 25 MG tablet Take 1 tablet (25 mg total) by mouth daily. 30 tablet 0    ROS:   General:  No weight loss, Fever, chills  Neurologic: No dizziness, blackouts, seizures. No recent symptoms of stroke or mini- stroke. No recent episodes of slurred speech, or temporary blindness.  Cardiac: No recent episodes of chest pain/pressure, no shortness of breath at rest.  No shortness of breath with exertion.  + history of atrial fibrillation or irregular heartbeat  Pulmonary: No home oxygen, no productive cough, no hemoptysis,  No asthma or wheezing   Physical Examination  Vitals:   07/21/19 1545 07/21/19 1600 07/21/19 1615 07/21/19 1630  BP: (!) 79/66 (!) 85/37 (!) 84/41 (!) 94/40  Pulse: (!) 42 (!) 43 (!) 44   Resp: 16 14 13 16   Temp:      TempSrc:      SpO2: 95% 96% 95%  There is no height or weight on file to calculate BMI.  General:  Alert and oriented, no acute distress HEENT: Normal Neck: No JVD Pulmonary: Clear to auscultation bilaterally Cardiac: Regular Rate and Rhythm Abdomen: Soft, non-tender, non-distended, no mass, 2 cm opening in the lower third of the midline laparotomy scar with watery dark bloody drainage.  There is no surrounding erythema.  On probing this wound with a Q-tip I could not find any obvious fascial defect.  I did pack the wound with a large amount of Nu Gauze and 4 x 4's. Skin: No rash Extremity Pulses: Absent radial pulses bilaterally, 2+ dorsalis pedis pulses bilaterally, femoral  pulses were difficult to palpate secondary to scar tissue.  However, I did not note any erythema or drainage from the groin incisions. Musculoskeletal: No deformity or edema  Neurologic: Upper and lower extremity motor 5/5 and symmetric, subjective weakness left hand  DATA:  CBC    Component Value Date/Time   WBC 4.1 07/21/2019 1440   RBC 3.28 (L) 07/21/2019 1440   HGB 9.8 (L) 07/21/2019 1440   HGB 13.4 06/04/2019 1036   HCT 32.1 (L) 07/21/2019 1440   HCT 41.7 06/04/2019 1036   PLT 198 07/21/2019 1440   PLT 205 06/04/2019 1036   MCV 97.9 07/21/2019 1440   MCV 86 06/04/2019 1036   MCH 29.9 07/21/2019 1440   MCHC 30.5 07/21/2019 1440   RDW 17.8 (H) 07/21/2019 1440   RDW 17.4 (H) 06/04/2019 1036   LYMPHSABS 0.7 07/21/2019 1440   MONOABS 0.3 07/21/2019 1440   EOSABS 0.1 07/21/2019 1440   BASOSABS 0.1 07/21/2019 1440    BMET    Component Value Date/Time   NA 141 07/21/2019 1440   NA 141 06/04/2019 1036   K 4.1 07/21/2019 1440   CL 108 07/21/2019 1440   CO2 18 (L) 07/21/2019 1440   GLUCOSE 78 07/21/2019 1440   BUN 32 (H) 07/21/2019 1440   BUN 14 06/04/2019 1036   CREATININE 1.88 (H) 07/21/2019 1440   CREATININE 0.96 08/13/2015 0939   CALCIUM 8.5 (L) 07/21/2019 1440   GFRNONAA 27 (L) 07/21/2019 1440   GFRAA 31 (L) 07/21/2019 1440   ALB 2.9  ASSESSMENT: 1.  Abdominal wound hematoma with skin separation no obvious fascial defect  2.  Mild to moderate protein calorie malnutrition and dehydration  3.  Mild acute kidney injury with creatinine of 1.8.  Patient was scheduled to have an appoint with Dr. Joelyn Oms tomorrow.  If her creatinine continues to rise we may have nephrology see the patient during this hospital admission.  Gentle IV hydration overnight.  4.  Left hand weakness with no palpable left radial pulse and previous brachial artery repair we will obtain upper extremity duplex exam for further evaluation  5.  Hypotension with bradycardia.  I suspect the bradycardia  is medication induced.  However the hypotension is probably secondary to hypovolemia.  We will give her gentle IV hydration overnight.   PLAN: Admit to surgical telemetry.  Plan as above.  I will notify Dr. Donzetta Matters of the patient's hospital admission tomorrow.   Ruta Hinds, MD [facility] Office: [phone] Pager: [phone]

## 2019-07-21 NOTE — ED Provider Notes (Signed)
[facility] EMERGENCY DEPARTMENT Provider Note   CSN: [id number] Arrival date & time: 07/21/19  1336     History Chief Complaint  Patient presents with  . Coagulation Disorder    Wendy [name] is a 69 y.o. female.  The history is provided by the patient and medical records.  Wound Check This is a new problem. The current episode started more than 1 week ago. The problem occurs rarely. The problem has not changed since onset.Pertinent negatives include no chest pain, no abdominal pain, no headaches and no shortness of breath. Nothing aggravates the symptoms. Nothing relieves the symptoms. She has tried nothing for the symptoms. The treatment provided no relief.       Past Medical History:  Diagnosis Date  . Anemia   . Anginal pain ([place])    jaw pain 07/2016, s/p 07/15/16 nuclear stress test  . Anxiety   . Arthritis    "lower back" (08/24/2017)  . Asthma   . CAD (coronary artery disease), native coronary artery    a. Hx CABG 1998, last PCI 2013 b.LHC 07/2013:  EF 55-60%, L-LAD prob atretic, no sig disease in LAD, S-OM1 ok with patent stent, S-dRCA ok => med Rx. c. LHC 01/2015 DES to SVG-RCA.  Marland Kitchen Chronic back pain   . Chronic leg pain   . Chronic lower back pain   . COPD (chronic obstructive pulmonary disease) ([place])   . Depression   . DVT (deep venous thrombosis) (HCC) X 3   RLE  . Fall 08/2016  . GERD (gastroesophageal reflux disease)   . Glucose intolerance (impaired glucose tolerance)   . Headache    "a few/month" (08/24/2017)  . HLD (hyperlipidemia)   . Hypertension   . Myocardial infarction ([place]) 1999  . OSA on CPAP   . PAD (peripheral artery disease) ([place])   . PAF (paroxysmal atrial fibrillation) ([place])    a. 09/2013 post-op b. 01/2015  . Peripheral neuropathy   . Pneumonia   . Shortness of breath    when walking  . Wears glasses     Patient Active Problem List   Diagnosis Date Noted  . Cardiogenic shock ([place])   . Aortoiliac occlusive  disease ([place]) 06/18/2019  . Coronary artery disease of native artery of native heart with stable angina pectoris ([place])   . 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 Acute combined systolic and diastolic heart failure ([place]) 07/26/2016  . Troponin level elevated 07/26/2016  . Chronic anticoagulation 07/22/2016  . CAD S/P multiple PCIs-last one 2016 07/22/2016  . NSTEMI (non-ST elevated myocardial infarction) ([place]) 07/22/2016  . Primary osteoarthritis of right hip 06/27/2016  . Avascular necrosis of hip, right ([place]) 06/27/2016  . PVD (peripheral vascular disease) ([place])   . Essential (primary) hypertension 01/14/2015  . Coronary artery disease involving native coronary artery of native heart without angina pectoris 10/06/2014  . Acute angina ([place]) 05/12/2014  . Epigastric pain   . Chest pain with high risk of acute coronary syndrome 10/21/2013  . Acute respiratory failure ([place]) 09/20/2013  . Nonspecific abnormal results of liver function study 09/17/2013  . Cholecystitis, acute 09/15/2013  . PAD (peripheral artery disease) ([place])   . COPD (chronic obstructive pulmonary disease) ([place])   . Peripheral neuropathy   . Chronic back pain   . Sleep apnea   . Anxiety   . GERD (gastroesophageal reflux disease)   . 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 Hypertension 07/20/2013  . HLD (hyperlipidemia) 07/20/2013  .  Unstable angina ([place]) 07/19/2013  . Hx of CABG 07/19/2013    Past Surgical History:  Procedure Laterality Date  . ABDOMINAL AORTOGRAM W/LOWER EXTREMITY Bilateral 04/29/2019   Procedure: ABDOMINAL AORTOGRAM W/LOWER EXTREMITY;  Surgeon: Waynetta Sandy, MD;  Location: [facility];  Service: Cardiovascular;  Laterality: Bilateral;  . ANTERIOR CERVICAL DECOMP/DISCECTOMY FUSION  1991  . AORTA - BILATERAL FEMORAL ARTERY BYPASS GRAFT N/A 06/18/2019   Procedure: AORTA BIFEMORAL BYPASS GRAFT;  Surgeon: Waynetta Sandy, MD;  Location: [facility];  Service:  Vascular;  Laterality: N/A;  . ARTERY REPAIR Left 06/30/2019   Procedure: LEFT BRACHIAL ARTERY REPAIR, EVACUATION OF HEMATOMA;  Surgeon: Serafina Mitchell, MD;  Location: [facility];  Service: Vascular;  Laterality: Left;  . BACK SURGERY    . CARDIAC CATHETERIZATION N/A 01/22/2015   Procedure: Left Heart Cath and Coronary Angiography;  Surgeon: Troy Sine, MD;  Location: [facility];  Service: Cardiovascular;  Laterality: N/A;  . CARDIAC CATHETERIZATION N/A 01/22/2015   Procedure: Coronary Stent Intervention;  Surgeon: Troy Sine, MD;  Location: [facility];  Service: Cardiovascular;  Laterality: N/A;  3.0x12 xience prox SVG to RCA  . CARDIAC CATHETERIZATION  03/09/2000   hx/notes 03/20/2000  . CHOLECYSTECTOMY N/A 09/16/2013   Procedure: LAPAROSCOPIC CHOLECYSTECTOMY CONVERTED TO OPEN CHOLECYSTECTOMY WITH CHOLANGIOGRAM;  Surgeon: [name], MD;  Location: [facility];  Service: General;  Laterality: N/A;  . CORONARY ANGIOGRAPHY N/A 06/27/2019   Procedure: CORONARY ANGIOGRAPHY (CATH LAB);  Surgeon: Sherren Mocha, MD;  Location: [facility];  Service: Cardiovascular;  Laterality: N/A;  . CORONARY ANGIOPLASTY WITH STENT PLACEMENT  11/2011, 02/2012, 01/2015   DES to SVG-RCA both times, In [place], [place], Dr. Bruce Donath  . CORONARY ARTERY BYPASS GRAFT  1998   LIMA-LAD, SVG-OM, SVG-RCA  . ERCP N/A 09/19/2013   Procedure: ENDOSCOPIC RETROGRADE CHOLANGIOPANCREATOGRAPHY (ERCP);  Surgeon: [name], MD;  Location: [facility];  Service: Endoscopy;  Laterality: N/A;  . FINGER SURGERY Right    ring finger "fused"  . FRACTURE SURGERY    . IRRIGATION AND DEBRIDEMENT ABSCESS Right 10/17/2013   Procedure: INCISION AND DRAINAGE RIGHT SUBCOSTAL WOUND;  Surgeon: [name], MD;  Location: [facility];  Service: General;  Laterality: Right;  . JOINT REPLACEMENT    . KNEE ARTHROSCOPY Right 1998  . LEFT HEART CATH AND CORS/GRAFTS ANGIOGRAPHY N/A 07/25/2016   Procedure: Left Heart Cath  and Cors/Grafts Angiography;  Surgeon: Jettie Booze, MD;  Location: [facility];  Service: Cardiovascular;  Laterality: N/A;  . LEFT HEART CATH AND CORS/GRAFTS ANGIOGRAPHY N/A 06/05/2019   Procedure: LEFT HEART CATH AND CORS/GRAFTS ANGIOGRAPHY;  Surgeon: Burnell Blanks, MD;  Location: [facility];  Service: Cardiovascular;  Laterality: N/A;  . LEFT HEART CATHETERIZATION WITH CORONARY/GRAFT ANGIOGRAM N/A 07/19/2013   Procedure: LEFT HEART CATHETERIZATION WITH Beatrix Fetters;  Surgeon: Leonie Man, MD;  Location: [facility] C[facility];  Service: Cardiovascular;  Laterality: N/A;  . MASS EXCISION Right 06/25/2014   Procedure: EXCISION BUTTOCK MASS ;  Surgeon: Coralie Keens, MD;  Location: [facility];  Service: General;  Laterality: Right;  . ORIF HUMERUS FRACTURE Left 08/27/2013   Procedure: OPEN REDUCTION INTERNAL FIXATION (ORIF) LEFT HUMERUS ;  Surgeon: Rozanna Box, MD;  Location: [facility];  Service: Orthopedics;  Laterality: Left;  . PLACEMENT OF IMPELLA LEFT VENTRICULAR ASSIST DEVICE N/A 06/24/2019   Procedure: PLACEMENT OF IMPELLA LEFT VENTRICULAR ASSIST DEVICE;  Surgeon:  Wonda Olds, MD;  Location: [facility];  Service: Open Heart Surgery;  Laterality: N/A;  . [place]  . REMOVAL OF IMPELLA LEFT VENTRICULAR ASSIST DEVICE Right 06/28/2019   Procedure: REMOVAL OF IMPELLA LEFT VENTRICULAR ASSIST DEVICE;  Surgeon: Wonda Olds, MD;  Location: [facility];  Service: Open Heart Surgery;  Laterality: Right;  . THROMBECTOMY / EMBOLECTOMY FEMORAL ARTERY Right    hx/notes 03/20/2000  . TOTAL HIP ARTHROPLASTY Right 07/19/2016   Procedure: TOTAL HIP ARTHROPLASTY ANTERIOR APPROACH;  Surgeon: [name], MD;  Location: [facility];  Service: Orthopedics;  Laterality: Right;  . TOTAL HIP REVISION Right 08/22/2017   Procedure: TOTAL HIP REVISION;  Surgeon: [name], MD;  Location: [facility];  Service: Orthopedics;  Laterality:  Right;     OB History   No obstetric history on file.     Family History  Problem Relation Age of Onset  . Lung cancer Mother   . Clotting disorder Father   . Lung cancer Sister   . Heart disease Brother   . Heart disease Brother   . Colon cancer Neg Hx   . Breast cancer Neg Hx     Social History   Tobacco Use  . Smoking status: Former Smoker    Packs/day: 0.50    Years: 34.00    Pack years: 17.00    Types: Cigarettes    Quit date: 07/20/1998    Years since quitting: 21.0  . Smokeless tobacco: Never Used  Substance Use Topics  . Alcohol use: Not Currently  . Drug use: Yes    Types: Marijuana    Home Medications Prior to Admission medications   Medication Sig Start Date End Date Taking? Authorizing Provider  acetaminophen (TYLENOL) 500 MG tablet Take 500-1,000 mg by mouth every 6 (six) hours as needed (for pain.).    [provider]  albuterol (PROVENTIL HFA;VENTOLIN HFA) 108 (90 Base) MCG/ACT inhaler Inhale 2 puffs into the lungs every 6 (six) hours as needed for wheezing or shortness of breath. 01/01/18   Bhagat, Crista Luria, PA  ALPRAZolam (XANAX) 0.5 MG tablet Take 0.5 mg by mouth 3 (three) times daily as needed for anxiety.    [provider]  amiodarone (PACERONE) 100 MG tablet Take 1 tablet (100 mg total) by mouth daily. 07/13/19   Ulyses Amor, PA-C  apixaban (ELIQUIS) 5 MG TABS tablet Take 1 tablet (5 mg total) by mouth 2 (two) times daily. 11/20/18   Nahser, Wonda Cheng, MD  aspirin 81 MG chewable tablet Chew 1 tablet (81 mg total) by mouth daily. 07/10/19   Dagoberto Ligas, PA-C  atorvastatin (LIPITOR) 80 MG tablet TAKE ONE TABLET BY MOUTH DAILY AT 6PM Patient taking differently: Take 80 mg by mouth daily at 6 PM. TAKE ONE TABLET BY MOUTH DAILY AT [facility] 11/20/18   Nahser, Wonda Cheng, MD  buPROPion (WELLBUTRIN XL) 150 MG 24 hr tablet Take 150 mg by mouth daily.    [provider]  clopidogrel (PLAVIX) 75 MG tablet Take 75 mg by mouth daily.     [provider]  diclofenac Sodium (VOLTAREN) 1 % GEL Apply 1 g topically 4 (four) times daily as needed for pain. 02/15/19   [provider]  ezetimibe (ZETIA) 10 MG tablet Take 10 mg by mouth daily.    [provider]  furosemide (LASIX) 20 MG tablet Take 1 tablet (20 mg total) by mouth daily. 07/13/19   Ulyses Amor, PA-C  gabapentin (NEURONTIN) 300 MG capsule Take 300 mg by mouth 3 (three) times daily as needed for pain. 02/15/19   [provider]  losartan (COZAAR) 25 MG tablet Take 1 tablet (25 mg total) by mouth daily. 07/13/19   Ulyses Amor, PA-C  nitroGLYCERIN (NITROSTAT) 0.4 MG SL tablet Place 1 tablet (0.4 mg total) under the tongue every 5 (five) minutes as needed for chest pain. 11/20/18   Nahser, Wonda Cheng, MD  oxyCODONE 10 MG TABS Take 1 tablet (10 mg total) by mouth 3 (three) times daily. 07/12/19   Ulyses Amor, PA-C  pantoprazole (PROTONIX) 40 MG tablet Take 1 tablet (40 mg total) by mouth daily. 11/20/18   Nahser, Wonda Cheng, MD  pramipexole (MIRAPEX) 0.25 MG tablet Take 0.25 mg by mouth at bedtime.  02/18/19   [provider]  ranolazine (RANEXA) 1000 MG SR tablet Take 1,000 mg by mouth 2 (two) times daily.    [provider]  sertraline (ZOLOFT) 100 MG tablet Take 150 mg by mouth at bedtime.     [provider]  spironolactone (ALDACTONE) 25 MG tablet Take 1 tablet (25 mg total) by mouth daily. 07/13/19   Ulyses Amor, PA-C    Allergies    Benadryl [diphenhydramine], Penicillins, Adhesive [tape], Amoxicillin, Cyclobenzaprine, and Hydrocodone  Review of Systems   Review of Systems  Constitutional: Positive for fatigue. Negative for chills, diaphoresis and fever.  HENT: Negative for congestion.   Respiratory: Negative for cough, chest tightness, shortness of breath and wheezing.   Cardiovascular: Negative for chest pain.  Gastrointestinal: Negative for abdominal distention, abdominal pain, constipation,  diarrhea, nausea and vomiting.       Abd bleeding  Genitourinary: Negative for dysuria.  Musculoskeletal: Negative for back pain, neck pain and neck stiffness.  Skin: Positive for wound.  Neurological: Negative for light-headedness and headaches.  Psychiatric/Behavioral: Negative for agitation and confusion.  All other systems reviewed and are negative.   Physical Exam Updated Vital Signs BP (!) 79/34   Pulse (!) 46   Temp (!) 97.5 F (36.4 C) (Oral)   Resp 16   SpO2 95%   Physical Exam Vitals and nursing note reviewed.  Constitutional:      General: She is not in acute distress.    Appearance: She is well-developed. She is not ill-appearing, toxic-appearing or diaphoretic.  HENT:     Head: Normocephalic and atraumatic.     Right Ear: External ear normal.     Left Ear: External ear normal.     Nose: Nose normal.     Mouth/Throat:     Pharynx: No oropharyngeal exudate.  Eyes:     Conjunctiva/sclera: Conjunctivae normal.     Pupils: Pupils are equal, round, and reactive to light.  Cardiovascular:     Rate and Rhythm: Normal rate.     Pulses: Normal pulses.     Heart sounds: Murmur present.  Pulmonary:     Effort: Pulmonary effort is normal. No respiratory distress.     Breath sounds: No stridor. No wheezing, rhonchi or rales.  Chest:     Chest wall: No tenderness.  Abdominal:     General: Abdomen is flat. A surgical scar is present. Bowel sounds are normal. There is no distension.     Palpations: Abdomen is soft.     Tenderness: There is no abdominal tenderness. There is no right CVA tenderness, left CVA tenderness, guarding or rebound.    Musculoskeletal:  General: No tenderness.     Cervical back: Normal range of motion and neck supple. No tenderness.  Skin:    General: Skin is warm.     Coloration: Skin is not pale.     Findings: No erythema or rash.  Neurological:     Mental Status: She is alert and oriented to person, place, and time.     Motor: No  abnormal muscle tone.     Deep Tendon Reflexes: Reflexes are normal and symmetric.  Psychiatric:        Mood and Affect: Mood normal.     ED Results / Procedures / Treatments   Labs (all labs ordered are listed, but only abnormal results are displayed) Labs Reviewed  CBC WITH DIFFERENTIAL/PLATELET - Abnormal; Notable for the following components:      Result Value   RBC 3.28 (*)    Hemoglobin 9.8 (*)    HCT 32.1 (*)    RDW 17.8 (*)    All other components within normal limits  COMPREHENSIVE METABOLIC PANEL - Abnormal; Notable for the following components:   CO2 18 (*)    BUN 32 (*)    Creatinine, Ser 1.88 (*)    Calcium 8.5 (*)    Total Protein 6.2 (*)    Albumin 2.9 (*)    GFR calc non Af Amer 27 (*)    GFR calc Af Amer 31 (*)    All other components within normal limits  PROTIME-INR - Abnormal; Notable for the following components:   Prothrombin Time 32.4 (*)    INR 3.3 (*)    All other components within normal limits  SARS CORONAVIRUS 2 BY RT PCR (HOSPITAL ORDER, [facility])  MAGNESIUM  CBC  BASIC METABOLIC PANEL  TYPE AND SCREEN    EKG EKG Interpretation  Date/Time:  Sunday Jul 21 2019 14:09:47 EDT Ventricular Rate:  46 PR Interval:    QRS Duration: 113 QT Interval:  474 QTC Calculation: 415 R Axis:   64 Text Interpretation: Sinus bradycardia LVH with secondary repolarization abnormality Confirmed by Quintella Reichert [phone]) on 07/21/2019 3:47:33 PM   Radiology DG Chest Port 1 View  Result Date: 07/21/2019 CLINICAL DATA:  Fall. Hypotension. EXAM: PORTABLE CHEST 1 VIEW COMPARISON:  06/26/2018 FINDINGS: Previous median sternotomy and CABG procedure. Lad and RCA coronary artery stents noted. Mild cardiac enlargement. No pleural effusion or edema. No airspace densities. Previous ORIF left humerus. IMPRESSION: No acute cardiopulmonary abnormalities. Electronically Signed   By: Kerby Moors M.D.   On: 07/21/2019 16:43   VAS [place] UPPER  EXTREMITY ARTERIAL DUPLEX  Result Date: 07/21/2019 UPPER EXTREMITY DUPLEX STUDY Indications: Patient complains of hand grip weakness. History:     Patient has a history of left brachial artery repair and evacuation              of hematoma 06/30/19.  Limitations: vessel tortuosity Comparison Study: Prior study from 06/30/19 is available for comparison Performing Technologist: Sharion Dove RVS  Examination Guidelines: A complete evaluation includes B-mode imaging, spectral Doppler, color Doppler, and power Doppler as needed of all accessible portions of each vessel. Bilateral testing is considered an integral part of a complete examination. Limited examinations for reoccurring indications may be performed as noted.  Left Doppler Findings: +---------------+----------+----------+--------+-------------------------------+ Site           PSV (cm/s)Waveform  StenosisComments                        +---------------+----------+----------+--------+-------------------------------+  Subclavian Prox64        monophasic                                        +---------------+----------+----------+--------+-------------------------------+ Axillary       128.00    monophasic                                        +---------------+----------+----------+--------+-------------------------------+ Brachial Prox  84        biphasic                                          +---------------+----------+----------+--------+-------------------------------+ Brachial Mid   66        biphasic                                          +---------------+----------+----------+--------+-------------------------------+ Brachial Dist  112       biphasic                                          +---------------+----------+----------+--------+-------------------------------+ Radial Prox    92        biphasic                                           +---------------+----------+----------+--------+-------------------------------+ Radial Mid     54        monophasic                                        +---------------+----------+----------+--------+-------------------------------+ Radial Dist    39        monophasic                                        +---------------+----------+----------+--------+-------------------------------+ Ulnar Prox     84        biphasic                                          +---------------+----------+----------+--------+-------------------------------+ Ulnar Mid      76        biphasic                                          +---------------+----------+----------+--------+-------------------------------+ Ulnar Dist     75        biphasic                                          +---------------+----------+----------+--------+-------------------------------+  Palmar Arch    70                          Doppler signal obliterates with                                            both radial and ulnar                                                      compression                     +---------------+----------+----------+--------+-------------------------------+   Summary:  Left: No apparent stenosis or thrombosis noted in the duplexed       arteries of the left upper extremity. Radial, ulnar, and       palmar waveforms appear similar to study done 06/30/19.  *See table(s) above for measurements and observations.    Preliminary     Procedures Procedures (including critical care time)  Medications Ordered in ED Medications  potassium chloride SA (KLOR-CON) CR tablet 20-40 mEq (has no administration in time range)  ondansetron (ZOFRAN) injection 4 mg (has no administration in time range)  alum & mag hydroxide-simeth (MAALOX/MYLANTA) 200-200-20 MG/5ML suspension 15-30 mL (has no administration in time range)  pantoprazole (PROTONIX) EC tablet 40 mg (40 mg Oral Given 07/21/19  2139)  labetalol (NORMODYNE) injection 10 mg (has no administration in time range)  hydrALAZINE (APRESOLINE) injection 5 mg (has no administration in time range)  metoprolol tartrate (LOPRESSOR) injection 2-5 mg (has no administration in time range)  guaiFENesin-dextromethorphan (ROBITUSSIN DM) 100-10 MG/5ML syrup 15 mL (has no administration in time range)  phenol (CHLORASEPTIC) mouth spray 1 spray (has no administration in time range)  0.9 %  sodium chloride infusion ( Intravenous New Bag/Given 07/21/19 2138)  acetaminophen (TYLENOL) tablet 325-650 mg (has no administration in time range)    Or  acetaminophen (TYLENOL) suppository 325-650 mg (has no administration in time range)  oxyCODONE (Oxy IR/ROXICODONE) immediate release tablet 5-10 mg (has no administration in time range)  morphine 2 MG/ML injection 2-5 mg (2 mg Intravenous Given 07/21/19 2045)  docusate sodium (COLACE) capsule 100 mg (100 mg Oral Given 07/21/19 2139)  albuterol (PROVENTIL) (2.5 MG/3ML) 0.083% nebulizer solution 3 mL (has no administration in time range)  ALPRAZolam (XANAX) tablet 0.5 mg (has no administration in time range)  amiodarone (PACERONE) tablet 100 mg (100 mg Oral Given 07/21/19 2139)  apixaban (ELIQUIS) tablet 5 mg (has no administration in time range)  aspirin chewable tablet 81 mg (has no administration in time range)  atorvastatin (LIPITOR) tablet 80 mg (has no administration in time range)  buPROPion (WELLBUTRIN XL) 24 hr tablet 150 mg (has no administration in time range)  clopidogrel (PLAVIX) tablet 75 mg (has no administration in time range)  ezetimibe (ZETIA) tablet 10 mg (has no administration in time range)  furosemide (LASIX) tablet 20 mg (has no administration in time range)  gabapentin (NEURONTIN) capsule 300 mg (has no administration in time range)  losartan (COZAAR) tablet 25 mg (has no administration in time range)  nitroGLYCERIN (NITROSTAT) SL tablet 0.4 mg (has no administration in time  range)  pramipexole (  MIRAPEX) tablet 0.25 mg (has no administration in time range)  ranolazine (RANEXA) 12 hr tablet 1,000 mg (has no administration in time range)  sertraline (ZOLOFT) tablet 150 mg (has no administration in time range)  spironolactone (ALDACTONE) tablet 25 mg (has no administration in time range)  sodium chloride 0.9 % bolus 500 mL (0 mLs Intravenous Stopped 07/21/19 1557)    ED Course  I have reviewed the triage vital signs and the nursing notes.  Pertinent labs & imaging results that were available during my care of the patient were reviewed by me and considered in my medical decision making (see chart for details).    MDM Rules/Calculators/A&P                      Wendy [name] is a 69 y.o. female with a complicated past medical history including CAD status post CABG, peripheral arterial disease with prior aortobifemoral artery bypass grafting on 06/18/2019, paroxysmal atrial fibrillation on Eliquis therapy, asthma, hyperlipidemia, COPD, GERD, prior DVT, and prior back surgeries who presents with abdominal bleeding.  According to patient, this afternoon, patient tried to pick up her [place] when she felt a small pop and started bleeding from her abdominal wound.  She reports "a lot" of blood came out of her abdomen prompting her to call for help.  Patient was found to be hypotensive on arrival with blood pressure in the 0000000 and [id number] systolic and feeling lightheaded.  Her heart rate is in the 40s and 50s but appears to be a sinus rhythm on initial EKG.  No STEMI.  Patient denies any chest pain, palpitations, shortness of breath.  She does report that over the last week she has had 2 falls falling onto her tailbone and would like imaging of her coccyx and sacrum performed.  She reports she still able to ambulate.  She denies new leg pain or leg swelling and denies current abdominal pain.  She denies nausea, vomiting, or urinary symptoms.  On exam, patient has a very small, 1  cm area of wound dehiscence at the top of the abdominal surgical wound below the umbilicus that has opened.  There was a small amount of bleeding from it.  It was controlled with pressure on arrival to the emergency department and has stopped bleeding.  No abdominal tenderness and normal bowel sounds otherwise appreciated.  No chest tenderness.  Lungs were clear.  Murmur was appreciated.  Patient has diffuse bruising but otherwise denies other complaints.  Due to the patient's soft blood pressures, will give a small amount of fluids as I do not want to further dilute if she has lost a significant mount of blood from the abdominal wound opening.  We will trend her blood pressure and give her 500 cc of fluid initially.  Will get hemoglobin and other screening labs.   Patient will have combat gauze placed onto her wound to ensure it does not continue bleeding.  Care transferred to Dr. Ralene Bathe while awaiting reassessment and monitoring for further abdominal bleeding and to trend her blood pressures.  If her heart rate is not improved and she remains hypotensive, anticipate discussion with either vascular surgery or cardiology.  If patient is feeling better and blood pressures have improved and her hemoglobin is not downtrending, anticipate discharge.   Final Clinical Impression(s) / ED Diagnoses Final diagnoses:  Bleeding    Clinical Impression: 1. Bleeding     Disposition: Care transferred to Dr. Ralene Bathe   This note  was prepared with assistance of Systems analyst. Occasional wrong-word or sound-a-like substitutions may have occurred due to the inherent limitations of voice recognition software.      Christol Thetford, Gwenyth Allegra, MD 07/21/19 2231

## 2019-07-21 NOTE — ED Provider Notes (Signed)
Patient care assumed at 1530.  Pt here for evaluation after her abdomen suture popped out and she had bleeding.  She reports recurrent falls at home recently due to chronic RLE weakness.  She took all her home meds today.  Denies any recent med changes or illnesses.  Repeat eval pending.    On exam she is chronically ill appearing, pale, bradycardic but perfused.  She has a tiny area of dehiscence on her abdominal incision site with what appears to be an old hematoma with small drainage.  Labs with stable anemia, AKI.  She is hypotensive and bradycardic, may be medication related in setting of AKI. Given small amount of wound his sense vascular surgery consulted. Dr. Oneida Alar evaluated the patient in the emergency department, plan to admit for further management.   Quintella Reichert, MD 07/22/19 0004

## 2019-07-21 NOTE — ED Notes (Signed)
Quick clot dsg applied to surgical site on abd

## 2019-07-21 NOTE — ED Triage Notes (Addendum)
Pt arrives via [facility] for c/o bleeding at graft site on abdomen that was placed about 1 month ago, does take blood thinners. States she bent over to pick up her dog and popped a stitch, bleeding began then. Pt a/ox4, resp e/u. Ems vss. Bleeding controlled with pressure.

## 2019-07-21 NOTE — Progress Notes (Signed)
VASCULAR LAB PRELIMINARY  PRELIMINARY  PRELIMINARY  PRELIMINARY  Left upper extremity arterial duplex completed.    Preliminary report:  See CV proc for preliminary results.  Tywone Bembenek, RVT 07/21/2019, 7:17 PM

## 2019-07-21 NOTE — ED Notes (Signed)
Attempted to call report

## 2019-07-21 NOTE — ED Notes (Signed)
Report given to Kari RN

## 2019-07-22 LAB — CBC
HCT: 28.7 % — ABNORMAL LOW (ref 36.0–46.0)
Hemoglobin: 8.8 g/dL — ABNORMAL LOW (ref 12.0–15.0)
MCH: 29.8 pg (ref 26.0–34.0)
MCHC: 30.7 g/dL (ref 30.0–36.0)
MCV: 97.3 fL (ref 80.0–100.0)
Platelets: 170 10*3/uL (ref 150–400)
RBC: 2.95 MIL/uL — ABNORMAL LOW (ref 3.87–5.11)
RDW: 17.8 % — ABNORMAL HIGH (ref 11.5–15.5)
WBC: 3.5 10*3/uL — ABNORMAL LOW (ref 4.0–10.5)
nRBC: 0 % (ref 0.0–0.2)

## 2019-07-22 LAB — BASIC METABOLIC PANEL
Anion gap: 7 (ref 5–15)
BUN: 25 mg/dL — ABNORMAL HIGH (ref 8–23)
CO2: 23 mmol/L (ref 22–32)
Calcium: 8.2 mg/dL — ABNORMAL LOW (ref 8.9–10.3)
Chloride: 112 mmol/L — ABNORMAL HIGH (ref 98–111)
Creatinine, Ser: 1.57 mg/dL — ABNORMAL HIGH (ref 0.44–1.00)
GFR calc Af Amer: 39 mL/min — ABNORMAL LOW (ref >60)
GFR calc non Af Amer: 34 mL/min — ABNORMAL LOW (ref >60)
Glucose, Bld: 85 mg/dL (ref 70–99)
Potassium: 4.1 mmol/L (ref 3.5–5.1)
Sodium: 142 mmol/L (ref 135–145)

## 2019-07-22 NOTE — Progress Notes (Addendum)
  Progress Note    07/22/2019 7:50 AM  Subjective:  Admitted last night from home due to abd incision dehiscence   Vitals:   07/21/19 2352 07/22/19 0404  BP: (!) 111/46 (!) 95/43  Pulse: (!) 52 (!) 51  Resp: 14 15  Temp: 97.9 F (36.6 C) (!) 97.5 F (36.4 C)  SpO2: 97% 97%   Physical Exam: Lungs:  Non labored Incisions: R chest incision healed; L arm incision healed; groin incisions healed; abd incision with small open area Extremities:  Palpable DP pulses; palpable ulnar bilaterally Abdomen:  Soft, NT, ND Neurologic: A&O      CBC    Component Value Date/Time   WBC 3.5 (L) 07/22/2019 0246   RBC 2.95 (L) 07/22/2019 0246   HGB 8.8 (L) 07/22/2019 0246   HGB 13.4 06/04/2019 1036   HCT 28.7 (L) 07/22/2019 0246   HCT 41.7 06/04/2019 1036   PLT 170 07/22/2019 0246   PLT 205 06/04/2019 1036   MCV 97.3 07/22/2019 0246   MCV 86 06/04/2019 1036   MCH 29.8 07/22/2019 0246   MCHC 30.7 07/22/2019 0246   RDW 17.8 (H) 07/22/2019 0246   RDW 17.4 (H) 06/04/2019 1036   LYMPHSABS 0.7 07/21/2019 1440   MONOABS 0.3 07/21/2019 1440   EOSABS 0.1 07/21/2019 1440   BASOSABS 0.1 07/21/2019 1440    BMET    Component Value Date/Time   NA 142 07/22/2019 0246   NA 141 06/04/2019 1036   K 4.1 07/22/2019 0246   CL 112 (H) 07/22/2019 0246   CO2 23 07/22/2019 0246   GLUCOSE 85 07/22/2019 0246   BUN 25 (H) 07/22/2019 0246   BUN 14 06/04/2019 1036   CREATININE 1.57 (H) 07/22/2019 0246   CREATININE 0.96 08/13/2015 0939   CALCIUM 8.2 (L) 07/22/2019 0246   GFRNONAA 34 (L) 07/22/2019 0246   GFRAA 39 (L) 07/22/2019 0246    INR    Component Value Date/Time   INR 3.3 (H) 07/21/2019 2038     Intake/Output Summary (Last 24 hours) at 07/22/2019 0750 Last data filed at 07/22/2019 0400 Gross per 24 hour  Intake 861.29 ml  Output --  Net 861.29 ml     Assessment/Plan:  69 y.o. female with ABF bypass, post op cardiac event, L brachial artery repair, R axilla impella re-admitted due  to abd incision dehiscence   All extremities well perfused Abd incision re-packed; wet to dry; will likely need HHRN for wound care Cr improved continue gentle hydration Dr. Donzetta Matters to evaluate later today   Dagoberto Ligas, PA-C [facility] [phone] 07/22/2019 7:50 AM   I have independently interviewed and examined patient and agree with PA assessment and plan above.   Khary Schaben C. Donzetta Matters, MD [facility] [place] Office: [phone] Pager: [phone]

## 2019-07-23 NOTE — TOC Transition Note (Addendum)
Transition of Care [facility]) - CM/SW Discharge Note Marvetta Gibbons RN, BSN Transitions of Care Unit 4E- RN Case Manager [phone]   Patient Details  Name: Wendy [name] MRN: [id number] Date of Birth: November 13, 1950  Transition of Care [facility]) CM/SW Contact:  Dawayne Patricia, RN Phone Number: 07/23/2019, 2:44 PM   Clinical Narrative:    Pt stable for transition home, order placed for [facility] needs for wound care (drsg w-d)- CM in to speak with pt at bedside- discussed [place] needs- per pt she has assistance at home and someone to assist with drsg changes (sister and aunt)- pt reports she has a RW and cane at home- requesting a [facility] for home however. Will arrange this prior to discharge. Pt states she has transportation home.  On return home from [facility] pt states she did not have any HH arranged, list provided for [facility] choice- Per CMS guidelines from medicare.gov website with star ratings (copy placed in shadow chart)- pt has selected [facility]-  Call made to [facility] with [facility] for [facility]- however they are unable to accept due to no capacity for nursing in pt's area. Spoke with pt again for alternate choice- pt states she does not have a preference at this point- and leaves it up to CM to find an agency that can provide needed nursing services.  Calls made to: [facility]- no nursing available [facility]- no nursing available [facility]- no nursing available [facility]- can do start of care end of this week- spoke with Tommi Rumps who has accepted the referral- pending on staffing may be able to do [facility] sooner.     Final next level of care: [place] Barriers to Discharge: No Barriers Identified   Patient Goals and CMS Choice Patient states their goals for this hospitalization and ongoing recovery are:: return home CMS Medicare.gov Compare Post Acute Care list provided to:: Patient Choice offered to / list presented to : Patient  Discharge Placement                   Home with [facility]     Discharge Plan and Services   Discharge Planning Services: CM Consult Post Acute Care Choice: Durable Medical Equipment, Home Health          DME Arranged: 3-N-1 DME Agency: [employer] Date DME Agency Contacted: 07/23/19 Time DME Agency Contacted: 1400 Representative spoke with at DME Agency: [place]: RN [place] Agency: [place] Date [place]: 07/23/19 Time [place]: 1430 Representative spoke with at [place]: [place] Interventions     Readmission Risk Interventions Readmission Risk Prevention Plan 07/23/2019  Transportation Screening Complete  Medication Review Press photographer) Complete  PCP or Specialist appointment within 3-5 days of discharge Complete  HRI or [place] Complete  SW Recovery Care/Counseling Consult Complete  [place] Not Applicable  Some recent data might be hidden

## 2019-07-23 NOTE — Progress Notes (Addendum)
Progress Note    07/23/2019 7:43 AM * No surgery found *  Subjective:  Says she ate lunch yesterday. Mild nausea, no vomiting   Vitals:   07/22/19 2330 07/23/19 0428  BP: (!) 101/49 (!) 111/39  Pulse: (!) 50 (!) 52  Resp: 17 12  Temp: 98.7 F (37.1 C) 97.9 F (36.6 C)  SpO2: 95% 99%    Physical Exam: Cardiac:  RRR Lungs:  CTA bil Incisions:  Bilateral groins incisions continue to heal nicely Extremities:  2+ palp DP pulses Abdomen: Incision inspected. No purulence noted on packing.  soft, ND, NT, active BS.  CBC    Component Value Date/Time   WBC 3.5 (L) 07/22/2019 0246   RBC 2.95 (L) 07/22/2019 0246   HGB 8.8 (L) 07/22/2019 0246   HGB 13.4 06/04/2019 1036   HCT 28.7 (L) 07/22/2019 0246   HCT 41.7 06/04/2019 1036   PLT 170 07/22/2019 0246   PLT 205 06/04/2019 1036   MCV 97.3 07/22/2019 0246   MCV 86 06/04/2019 1036   MCH 29.8 07/22/2019 0246   MCHC 30.7 07/22/2019 0246   RDW 17.8 (H) 07/22/2019 0246   RDW 17.4 (H) 06/04/2019 1036   LYMPHSABS 0.7 07/21/2019 1440   MONOABS 0.3 07/21/2019 1440   EOSABS 0.1 07/21/2019 1440   BASOSABS 0.1 07/21/2019 1440    BMET    Component Value Date/Time   NA 142 07/22/2019 0246   NA 141 06/04/2019 1036   K 4.1 07/22/2019 0246   CL 112 (H) 07/22/2019 0246   CO2 23 07/22/2019 0246   GLUCOSE 85 07/22/2019 0246   BUN 25 (H) 07/22/2019 0246   BUN 14 06/04/2019 1036   CREATININE 1.57 (H) 07/22/2019 0246   CREATININE 0.96 08/13/2015 0939   CALCIUM 8.2 (L) 07/22/2019 0246   GFRNONAA 34 (L) 07/22/2019 0246   GFRAA 39 (L) 07/22/2019 0246     Intake/Output Summary (Last 24 hours) at 07/23/2019 0743 Last data filed at 07/23/2019 0400 Gross per 24 hour  Intake 2848.5 ml  Output 900 ml  Net 1948.5 ml    HOSPITAL MEDICATIONS Scheduled Meds: . amiodarone  100 mg Oral Daily  . apixaban  5 mg Oral BID  . aspirin  81 mg Oral Daily  . atorvastatin  80 mg Oral q1800  . buPROPion  150 mg Oral Daily  . clopidogrel  75 mg  Oral Daily  . docusate sodium  100 mg Oral BID  . ezetimibe  10 mg Oral Daily  . furosemide  20 mg Oral Daily  . losartan  25 mg Oral Daily  . pantoprazole  40 mg Oral Daily  . potassium chloride  20-40 mEq Oral Once  . pramipexole  0.25 mg Oral QHS  . ranolazine  1,000 mg Oral BID  . sertraline  150 mg Oral QHS  . spironolactone  25 mg Oral Daily   Continuous Infusions: . sodium chloride 100 mL/hr at 07/23/19 0429   PRN Meds:.acetaminophen **OR** acetaminophen, albuterol, ALPRAZolam, alum & mag hydroxide-simeth, gabapentin, guaiFENesin-dextromethorphan, hydrALAZINE, labetalol, metoprolol tartrate, morphine injection, nitroGLYCERIN, ondansetron, oxyCODONE, phenol  Assessment:Midline wound dehiscence without fascial disruption or cellulitis       Plan: -Continue wet-dry packing to midline incision.  Follow-up arrangements for [facility] with TOC team. On apixiban , Plavix and ASA    Risa Grill, PA-C [facility] [phone] 07/23/2019  7:43 AM   I have independently interviewed and examined patient and agree with PA assessment and plan above.  Should be able  to go home with home health care set up for her wound.  Cathryn Gallery C. Donzetta Matters, MD [facility] [place] Office: [phone] Pager: [phone]

## 2019-07-23 NOTE — Progress Notes (Signed)
Discharge instructions provided to patient. All medications, follow up appointments, and discharge instructions provided to patient. Dressing changed and instructions on how to change dressing provided. IV out. Monitor off CCMD notified. Discharging to home.  Paulene Floor, RN

## 2019-07-24 NOTE — Discharge Summary (Signed)
Discharge Summary  Patient ID: Wendy [name] [id number] 68 y.o. [birth year]  Admit date: 07/21/2019  Discharge date and time: 07/23/2019  4:47 PM   Admitting Physician: Elam Dutch, MD   Discharge Physician: Elam Dutch, MD  Admission Diagnoses: Iron deficiency anemia, unspecified [D50.9] Bleeding [R58] Hematoma [T14.8XXA]  Discharge Diagnoses: Wound dehiscence, mild dehydration, mild malnutrition, hypotension, AKI  Admission Condition: fair  Discharged Condition: fair  Indication for Admission: wound dehiscence  Hospital Course: Wendy [name] is a 69 y.o. female, who is status post aortobifemoral bypass by Dr. Donzetta Matters approximately 1 month ago.  On the day admission, she was lifting up her chair and noticed a pop in her abdomen.  She then began to drain bloody drainage.  She denied any fever or chills.  She had had some constipation symptoms and her last bowel movement was 3 days ago.  She has not had any nausea or vomiting.  She stated she had some occasional drainage from her groins but not as significant.  The drainage from her abdomen was bloody in character.  She was admitted for further evaluation and work-up.  Laboratory work-up revealed mild to moderate protein calorie malnutrition and dehydration.  In addition, it was noted her creatinine had increased to 1.8 consistent with mild acute kidney injury.  Vital signs revealed mild hypotension with bradycardia.  She was given gentle hydration and her hypotension and bradycardia resolved.  Her creatinine improved to 1.57.  Local wound care to her abdomen was carried out.  Her history was also significant for left brachial artery repair and her left hand was well perfused.  On the day of discharge, her wound was healing without signs of cellulitis and her vital signs remained stable.  Arrangements were made for home health to continue local wound care.  Advised to contact Dr. Adin Hector office to reschedule her appointment  with him.  Consults: None  Treatments: IV hydration  Discharge Exam: Vital signs stable, afebrile.  Patient awake and alert in no apparent distress.  Midline incision responding to local wound care.  All extremities well perfused Vitals:   07/23/19 0753 07/23/19 1200  BP: (!) 119/55 111/63  Pulse: 98 60  Resp: 14 17  Temp: 97.6 F (36.4 C) 97.8 F (36.6 C)  SpO2: 100% 100%   Cardiac:  RRR Lungs:  CTAB Incisions:  Bilateral groin wounds continue to heal without signs of infection Extremities:  Well perfused. Palpable pedal pulses Abdomen:  Soft, ND, normoactive BS.  Midline incision: approx 4cm area of dehiscence not involving the fascia.  No purulence or erythema Neurologic: Intact. A and ) x 4   Disposition: Discharge disposition: 06-Home-Health Care Svc       Patient Instructions:  Allergies as of 07/23/2019      Reactions   Benadryl [diphenhydramine] Shortness Of Breath   Penicillins Diarrhea   Did it involve swelling of the face/tongue/throat, SOB, or low BP? No Did it involve sudden or severe rash/hives, skin peeling, or any reaction on the inside of your mouth or nose? No Did you need to seek medical attention at a hospital or doctor's office? No When did it last happen?2 months If all above answers are "NO", may proceed with cephalosporin use.   Adhesive [tape] Other (See Comments)   Burning of skin   Amoxicillin Diarrhea, Other (See Comments)   Has patient had a PCN reaction causing immediate rash, facial/tongue/throat swelling, SOB or lightheadedness with hypotension: No Has patient had a PCN reaction causing  severe rash involving mucus membranes or skin necrosis: No Has patient had a PCN reaction that required hospitalization No Has patient had a PCN reaction occurring within the last 10 years: Yes -- noted rxn as diarrhea If all of the above answers are "NO", then may proceed with Cephalosporin use.   Cyclobenzaprine Other (See Comments)   Causes  restless leg syndrome Restless syndrome   Hydrocodone Itching      Medication List    TAKE these medications   acetaminophen 500 MG tablet Commonly known as: TYLENOL Take 500-1,000 mg by mouth every 6 (six) hours as needed (for pain.).   albuterol 108 (90 Base) MCG/ACT inhaler Commonly known as: VENTOLIN HFA Inhale 2 puffs into the lungs every 6 (six) hours as needed for wheezing or shortness of breath.   ALPRAZolam 0.5 MG tablet Commonly known as: XANAX Take 0.5 mg by mouth 3 (three) times daily as needed for anxiety.   amiodarone 100 MG tablet Commonly known as: PACERONE Take 1 tablet (100 mg total) by mouth daily.   apixaban 5 MG Tabs tablet Commonly known as: Eliquis Take 1 tablet (5 mg total) by mouth 2 (two) times daily.   aspirin 81 MG chewable tablet Chew 1 tablet (81 mg total) by mouth daily.   atorvastatin 80 MG tablet Commonly known as: LIPITOR TAKE ONE TABLET BY MOUTH DAILY AT 6PM What changed:   how much to take  how to take this  when to take this   buPROPion 150 MG 24 hr tablet Commonly known as: WELLBUTRIN XL Take 150 mg by mouth daily.   clopidogrel 75 MG tablet Commonly known as: PLAVIX Take 75 mg by mouth daily.   diclofenac Sodium 1 % Gel Commonly known as: VOLTAREN Apply 1 g topically 4 (four) times daily as needed for pain.   ezetimibe 10 MG tablet Commonly known as: ZETIA Take 10 mg by mouth daily.   furosemide 20 MG tablet Commonly known as: LASIX Take 1 tablet (20 mg total) by mouth daily.   gabapentin 300 MG capsule Commonly known as: NEURONTIN Take 300 mg by mouth 3 (three) times daily as needed for pain.   losartan 25 MG tablet Commonly known as: COZAAR Take 1 tablet (25 mg total) by mouth daily.   nitroGLYCERIN 0.4 MG SL tablet Commonly known as: NITROSTAT Place 1 tablet (0.4 mg total) under the tongue every 5 (five) minutes as needed for chest pain.   Oxycodone HCl 10 MG Tabs Take 1 tablet (10 mg total) by mouth  3 (three) times daily.   pantoprazole 40 MG tablet Commonly known as: PROTONIX Take 1 tablet (40 mg total) by mouth daily.   pramipexole 0.25 MG tablet Commonly known as: MIRAPEX Take 0.25 mg by mouth at bedtime.   Ranexa 1000 MG SR tablet Generic drug: ranolazine Take 1,000 mg by mouth 2 (two) times daily.   sertraline 100 MG tablet Commonly known as: ZOLOFT Take 150 mg by mouth at bedtime.   spironolactone 25 MG tablet Commonly known as: ALDACTONE Take 1 tablet (25 mg total) by mouth daily.      Activity: activity as tolerated Diet: cardiac diet Wound Care: Wet to dry normal saline packing to midline abdominal wound.  Change daily  Follow-up with Dr. Oneida Alar in As previously arranged .  Signed: Barbie Banner, PA-C 07/24/2019 9:14 AM [facility] Office: [phone]

## 2019-08-02 ENCOUNTER — Ambulatory Visit (INDEPENDENT_AMBULATORY_CARE_PROVIDER_SITE_OTHER): Payer: Self-pay | Admitting: Vascular Surgery

## 2019-08-02 ENCOUNTER — Other Ambulatory Visit: Payer: Self-pay

## 2019-08-02 ENCOUNTER — Encounter: Payer: Self-pay | Admitting: Vascular Surgery

## 2019-08-02 VITALS — BP 143/76 | HR 55 | Resp 16 | Ht 61.0 in | Wt 135.0 lb

## 2019-08-02 DIAGNOSIS — I739 Peripheral vascular disease, unspecified: Secondary | ICD-10-CM

## 2019-08-02 MED ORDER — PROMETHAZINE HCL 12.5 MG PO TABS
12.5000 mg | ORAL_TABLET | Freq: Four times a day (QID) | ORAL | 0 refills | Status: DC | PRN
Start: 2019-08-02 — End: 2019-11-06

## 2019-08-02 NOTE — Progress Notes (Signed)
    Subjective:     Patient ID: Wendy [name], female   DOB: 03-09-1950, 69 y.o.   MRN: [id number]  HPI 69 year old female follows up after aortobifemoral bypass for bilateral lower extremity rest pain.  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  She is now on home doing well.  She was recently readmitted with midline abdominal wound.  This is healing well with wet-to-dry dressings placed by the patient.  Overall she does have some nausea has had some difficulty eating and has lost weight but is walking with the help of a cane at this time.   Review of Systems Weight loss    Objective:   Physical Exam Vitals:   08/02/19 0818  BP: (!) 143/76  Pulse: (!) 55  Resp: 16  SpO2: 96%  Awake alert oriented Nonlabored respirations Midline abdominal incision nearly healed with 1 cm there is open with serous drainage Palpable dorsalis pedis pulses bilaterally     Assessment:     69 year old female status post above-noted procedures recently readmitted with opening of her midline incision.  This appears to be healing well.    Plan:     Patient will follow up in 6 months I have sent Phenergan to her pharmacy for nausea I discussed with patient the possible outcomes including possibility of a hernia in the future she demonstrates good understanding.  Abcde Oneil C. Donzetta Matters, MD [facility] [place] Office: [phone] Pager: [phone]

## 2019-08-06 ENCOUNTER — Other Ambulatory Visit: Payer: Self-pay | Admitting: *Deleted

## 2019-08-06 DIAGNOSIS — I739 Peripheral vascular disease, unspecified: Secondary | ICD-10-CM

## 2019-08-09 DIAGNOSIS — N1831 Chronic kidney disease, stage 3a: Secondary | ICD-10-CM | POA: Diagnosis not present

## 2019-08-09 DIAGNOSIS — N179 Acute kidney failure, unspecified: Secondary | ICD-10-CM | POA: Diagnosis not present

## 2019-08-14 ENCOUNTER — Telehealth: Payer: Self-pay

## 2019-08-14 DIAGNOSIS — E876 Hypokalemia: Secondary | ICD-10-CM | POA: Diagnosis not present

## 2019-08-14 DIAGNOSIS — D6869 Other thrombophilia: Secondary | ICD-10-CM | POA: Diagnosis not present

## 2019-08-14 DIAGNOSIS — I25810 Atherosclerosis of coronary artery bypass graft(s) without angina pectoris: Secondary | ICD-10-CM | POA: Diagnosis not present

## 2019-08-14 DIAGNOSIS — I959 Hypotension, unspecified: Secondary | ICD-10-CM | POA: Diagnosis not present

## 2019-08-14 DIAGNOSIS — I739 Peripheral vascular disease, unspecified: Secondary | ICD-10-CM | POA: Diagnosis not present

## 2019-08-14 DIAGNOSIS — I480 Paroxysmal atrial fibrillation: Secondary | ICD-10-CM | POA: Diagnosis not present

## 2019-08-14 DIAGNOSIS — J449 Chronic obstructive pulmonary disease, unspecified: Secondary | ICD-10-CM | POA: Diagnosis not present

## 2019-08-14 DIAGNOSIS — M549 Dorsalgia, unspecified: Secondary | ICD-10-CM | POA: Diagnosis not present

## 2019-08-14 NOTE — Telephone Encounter (Signed)
Telephone call received from [facility] with [facility] requesting a 90-day Rx for Amiodarone. LM advising should follow up with cardiologist listed as Dr. [name] for continued refills and management. Minette Brine, RN

## 2019-08-15 ENCOUNTER — Telehealth: Payer: Self-pay | Admitting: Cardiovascular Disease

## 2019-08-15 DIAGNOSIS — E785 Hyperlipidemia, unspecified: Secondary | ICD-10-CM | POA: Diagnosis not present

## 2019-08-15 DIAGNOSIS — I208 Other forms of angina pectoris: Secondary | ICD-10-CM | POA: Diagnosis not present

## 2019-08-15 DIAGNOSIS — F322 Major depressive disorder, single episode, severe without psychotic features: Secondary | ICD-10-CM | POA: Diagnosis not present

## 2019-08-15 DIAGNOSIS — J449 Chronic obstructive pulmonary disease, unspecified: Secondary | ICD-10-CM | POA: Diagnosis not present

## 2019-08-15 DIAGNOSIS — I480 Paroxysmal atrial fibrillation: Secondary | ICD-10-CM | POA: Diagnosis not present

## 2019-08-15 DIAGNOSIS — I10 Essential (primary) hypertension: Secondary | ICD-10-CM | POA: Diagnosis not present

## 2019-08-15 DIAGNOSIS — I25810 Atherosclerosis of coronary artery bypass graft(s) without angina pectoris: Secondary | ICD-10-CM | POA: Diagnosis not present

## 2019-08-15 MED ORDER — AMIODARONE HCL 100 MG PO TABS: 100.0000 mg | ORAL_TABLET | Freq: Every day | ORAL | 2 refills | Status: DC

## 2019-08-15 NOTE — Telephone Encounter (Signed)
Pt's medication was sent to pt's pharmacy as requested. Confirmation received.  °

## 2019-08-15 NOTE — Telephone Encounter (Signed)
*  STAT* If patient is at the pharmacy, call can be transferred to refill team.   1. Which medications need to be refilled? (please list name of each medication and dose if known) amiodarone (PACERONE) 100 MG tablet  2. Which pharmacy/location (including street and city if local pharmacy) is medication to be sent to? [facility] - [place], [place] - [place] [address]  3. Do they need a 30 day or 90 day supply? 90 day    Patient is out of medication

## 2019-08-29 DIAGNOSIS — E539 Vitamin B deficiency, unspecified: Secondary | ICD-10-CM | POA: Diagnosis not present

## 2019-08-29 DIAGNOSIS — M549 Dorsalgia, unspecified: Secondary | ICD-10-CM | POA: Diagnosis not present

## 2019-08-29 DIAGNOSIS — J449 Chronic obstructive pulmonary disease, unspecified: Secondary | ICD-10-CM | POA: Diagnosis not present

## 2019-08-29 DIAGNOSIS — D6869 Other thrombophilia: Secondary | ICD-10-CM | POA: Diagnosis not present

## 2019-08-29 DIAGNOSIS — I480 Paroxysmal atrial fibrillation: Secondary | ICD-10-CM | POA: Diagnosis not present

## 2019-08-29 DIAGNOSIS — I10 Essential (primary) hypertension: Secondary | ICD-10-CM | POA: Diagnosis not present

## 2019-08-29 DIAGNOSIS — F322 Major depressive disorder, single episode, severe without psychotic features: Secondary | ICD-10-CM | POA: Diagnosis not present

## 2019-08-29 DIAGNOSIS — I25810 Atherosclerosis of coronary artery bypass graft(s) without angina pectoris: Secondary | ICD-10-CM | POA: Diagnosis not present

## 2019-08-29 DIAGNOSIS — I208 Other forms of angina pectoris: Secondary | ICD-10-CM | POA: Diagnosis not present

## 2019-08-29 NOTE — Progress Notes (Deleted)
Cardiology Office Note:    Date:  08/29/2019   ID:  Wendy [name], DOB 1951-01-07, MRN [id number]  PCP:  Josetta Huddle, MD  Cardiologist:  [name], MD *** Electrophysiologist:  None   Referring MD: Josetta Huddle, MD   Chief Complaint:  No chief complaint on file.    Patient Profile:    Wendy [name] is a 69 y.o. female with:   Coronary artery disease   S/p NSTEMI >> s/p CABG in 1998  S/p PCI in 2013, 2016  Hx of near syncope after taking SL NTG  Grafts known to be occluded  S/p post op (Ao-Bifem bypass) MI 06/2019 c/b CGS; hemodynamic support w Impella  Cath: mod calcific pLAD stenosis (unchanged), dRCA and dLCx fill by collats from LAD, all grafts occluded >> Med Rx   Peripheral Arterial Disease  S/p L fem-fem bypass    S/p Ao-Bifem bypass 06/2019  Paroxysmal atrial fibrillation   CHA2DS2-VASc=5 (HTN, CHF, female, age x 1, vasc dz) >> Apixaban    Hypertension   Chronic combined systolic and diastolic CHF  Echocardiogram 5/18: EF 60-65  Echocardiogram 4/21: EF 55-60  Echocardiogram 4/21: EF 20-25  COPD  GERD   Hx of substance abuse   Neuropathy    Prior CV studies: Cardiac catheterization 2019-07-22 LAD prox 60; D1 100 CTO LCx dist 80; OM1 100 CTO, stent 100 ISR; OM3 100 CTO RCA prox 100 CTO S-RPDA 100 S-OM1 100 L-LAD 100  Echocardiogram 06/24/19 EF 20-25, Gr 3 DD, normal RVSF, mild MR  Echocardiogram 06/17/19 EF 55-60, inf HK, low normal RVSF, mild MR, RVSP 47  Cardiac catheterization 06/05/19 1. Stable three vessel CAD s/p 3V CABG with known occlusion of all three grafts.  2. Moderate, calcified mid LAD stenosis that is angiographically improved from cath in 2018.  3. Chronic occlusion proximal RCA. The vessel fills from left to right collaterals.  4. Chronic occlusion obtuse marginal and distal Circumflex.   Echocardiogram 07/23/16 EF 60-65  History of Present Illness:    Wendy [name] was last seen in clinic by Leanor Kail, PA-C for surgical clearance prior to her Ao-Bifem bypass.  She ultimately underwent cardiac catheterization which demonstrated mod dz in the LAD and all bypass grafts occluded.  Med Rx was continued.  She underwent Ao-Bifem bypass with Dr. Donzetta Matters 06/18/2019.  She had a postop MI c/b PEA arrest and CGS requiring hemodynamic support with Impella device.  Her EF was severely depressed on echocardiogram at 20-25%.  She required transfusion with PRBCs x 2 due to significant anemia.  She had intermittent atrial fibrillation post op and was placed on Amiodarone.  She had issues with bradycardia and junctional rhythm.  Her Carvedilol and Digoxin were stopped.  Cardiac catheterization demonstrated stable anatomy and med Rx was continued.  She developed a L brachial pseudoaneurysm post cath that required surgical correction.  She was DC to SNF.   She was readmitted 5/16-5/18 with abdominal wound dehiscence c/b hypotension and AKI in the setting of protein calorie malnutrition.  She was hydrated and supported with local wound care.    Wendy [name] returns for follow up.  The DICTATELATER SmartLink is not supported in this context. ***   Past Medical History:  Diagnosis Date  . Anemia   . Anginal pain ([place])    jaw pain 07/2016, s/p 07/15/16 nuclear stress test  . Anxiety   . Arthritis    "lower back" (08/24/2017)  . Asthma   . CAD (coronary artery disease),  native coronary artery    a. Hx CABG 1998, last PCI 2013 b.LHC 07/2013:  EF 55-60%, L-LAD prob atretic, no sig disease in LAD, S-OM1 ok with patent stent, S-dRCA ok => med Rx. c. LHC 01/2015 DES to SVG-RCA.  Marland Kitchen Chronic back pain   . Chronic leg pain   . Chronic lower back pain   . COPD (chronic obstructive pulmonary disease) ([place])   . Depression   . DVT (deep venous thrombosis) ([facility]) X 3   RLE  . Fall 08/2016  . GERD (gastroesophageal reflux disease)   . Glucose intolerance (impaired glucose tolerance)   . Headache    "a few/month" (08/24/2017)  .  HLD (hyperlipidemia)   . Hypertension   . Myocardial infarction ([place]) 1999  . OSA on CPAP   . PAD (peripheral artery disease) ([place])   . PAF (paroxysmal atrial fibrillation) ([place])    a. 09/2013 post-op b. 01/2015  . Peripheral neuropathy   . Pneumonia   . Shortness of breath    when walking  . Wears glasses     Current Medications: No outpatient medications have been marked as taking for the 08/30/19 encounter (Appointment) with Richardson Dopp T, PA-C.   Current Facility-Administered Medications for the 08/30/19 encounter (Appointment) with Liliane Shi, PA-C  Medication  . ferrous sulfate tablet 325 mg     Allergies:   Benadryl [diphenhydramine], Penicillins, Adhesive [tape], Amoxicillin, Cyclobenzaprine, and Hydrocodone   Social History   Tobacco Use  . Smoking status: Former Smoker    Packs/day: 0.50    Years: 34.00    Pack years: 17.00    Types: Cigarettes    Quit date: 07/20/1998    Years since quitting: 21.1  . Smokeless tobacco: Never Used  Vaping Use  . Vaping Use: Never used  Substance Use Topics  . Alcohol use: Not Currently  . Drug use: Yes    Types: Marijuana     Family Hx: The patient's family history includes Clotting disorder in her father; Heart disease in her brother and brother; Lung cancer in her mother and sister. There is no history of Colon cancer or Breast cancer.  ROS   EKGs/Labs/Other Test Reviewed:    EKG:  EKG is *** ordered today.  The ekg ordered today demonstrates ***  Recent Labs: 06/19/2019: B Natriuretic Peptide 366.7 07/21/2019: ALT 13; Magnesium 1.7 07/22/2019: BUN 25; Creatinine, Ser 1.57; Hemoglobin 8.8; Platelets 170; Potassium 4.1; Sodium 142   Recent Lipid Panel Lab Results  Component Value Date/Time   CHOL 98 (L) 12/05/2016 11:39 AM   TRIG 73 12/05/2016 11:39 AM   HDL 43 12/05/2016 11:39 AM   CHOLHDL 2.3 12/05/2016 11:39 AM   CHOLHDL 4.1 08/13/2015 09:39 AM   LDLCALC 40 12/05/2016 11:39 AM    Physical Exam:     VS:  There were no vitals taken for this visit.    Wt Readings from Last 3 Encounters:  08/02/19 135 lb (61.2 kg)  07/21/19 136 lb 14.5 oz (62.1 kg)  07/12/19 137 lb 12.8 oz (62.5 kg)     Physical Exam ***  ASSESSMENT & PLAN:    ***  Dispo:  No follow-ups on file.   Medication Adjustments/Labs and Tests Ordered: Current medicines are reviewed at length with the patient today.  Concerns regarding medicines are outlined above.  Tests Ordered: No orders of the defined types were placed in this encounter.  Medication Changes: No orders of the defined types were placed in this encounter.  Signed, Richardson Dopp, PA-C  08/29/2019 10:25 PM    [facility] [address], [place], [place]  [address] Phone: [phone]; Fax: [phone]

## 2019-08-30 ENCOUNTER — Ambulatory Visit: Payer: Medicare HMO | Admitting: Physician Assistant

## 2019-08-30 ENCOUNTER — Encounter: Payer: Self-pay | Admitting: Physician Assistant

## 2019-09-10 ENCOUNTER — Other Ambulatory Visit: Payer: Self-pay

## 2019-09-10 ENCOUNTER — Inpatient Hospital Stay (HOSPITAL_COMMUNITY)
Admission: EM | Admit: 2019-09-10 | Discharge: 2019-09-13 | DRG: 291 | Disposition: A | Discharge status: Home / Self Care | Payer: Medicare HMO | Attending: Family Medicine | Admitting: Family Medicine

## 2019-09-10 ENCOUNTER — Encounter (HOSPITAL_COMMUNITY): Payer: Self-pay

## 2019-09-10 ENCOUNTER — Emergency Department (HOSPITAL_COMMUNITY): Payer: Medicare HMO

## 2019-09-10 DIAGNOSIS — Z8249 Family history of ischemic heart disease and other diseases of the circulatory system: Secondary | ICD-10-CM

## 2019-09-10 DIAGNOSIS — R0902 Hypoxemia: Secondary | ICD-10-CM

## 2019-09-10 DIAGNOSIS — J9601 Acute respiratory failure with hypoxia: Secondary | ICD-10-CM | POA: Diagnosis present

## 2019-09-10 DIAGNOSIS — G4733 Obstructive sleep apnea (adult) (pediatric): Secondary | ICD-10-CM | POA: Diagnosis present

## 2019-09-10 DIAGNOSIS — J189 Pneumonia, unspecified organism: Secondary | ICD-10-CM | POA: Diagnosis present

## 2019-09-10 DIAGNOSIS — Z79891 Long term (current) use of opiate analgesic: Secondary | ICD-10-CM

## 2019-09-10 DIAGNOSIS — Z951 Presence of aortocoronary bypass graft: Secondary | ICD-10-CM

## 2019-09-10 DIAGNOSIS — Z7901 Long term (current) use of anticoagulants: Secondary | ICD-10-CM | POA: Diagnosis not present

## 2019-09-10 DIAGNOSIS — Z86718 Personal history of other venous thrombosis and embolism: Secondary | ICD-10-CM | POA: Diagnosis not present

## 2019-09-10 DIAGNOSIS — I110 Hypertensive heart disease with heart failure: Secondary | ICD-10-CM | POA: Diagnosis not present

## 2019-09-10 DIAGNOSIS — D509 Iron deficiency anemia, unspecified: Secondary | ICD-10-CM

## 2019-09-10 DIAGNOSIS — J440 Chronic obstructive pulmonary disease with acute lower respiratory infection: Secondary | ICD-10-CM | POA: Diagnosis not present

## 2019-09-10 DIAGNOSIS — R079 Chest pain, unspecified: Secondary | ICD-10-CM | POA: Diagnosis not present

## 2019-09-10 DIAGNOSIS — I10 Essential (primary) hypertension: Secondary | ICD-10-CM | POA: Diagnosis not present

## 2019-09-10 DIAGNOSIS — G629 Polyneuropathy, unspecified: Secondary | ICD-10-CM | POA: Diagnosis present

## 2019-09-10 DIAGNOSIS — I255 Ischemic cardiomyopathy: Secondary | ICD-10-CM | POA: Diagnosis present

## 2019-09-10 DIAGNOSIS — Z885 Allergy status to narcotic agent status: Secondary | ICD-10-CM | POA: Diagnosis not present

## 2019-09-10 DIAGNOSIS — I5021 Acute systolic (congestive) heart failure: Secondary | ICD-10-CM | POA: Diagnosis not present

## 2019-09-10 DIAGNOSIS — J449 Chronic obstructive pulmonary disease, unspecified: Secondary | ICD-10-CM | POA: Diagnosis present

## 2019-09-10 DIAGNOSIS — Z880 Allergy status to penicillin: Secondary | ICD-10-CM

## 2019-09-10 DIAGNOSIS — G8929 Other chronic pain: Secondary | ICD-10-CM | POA: Diagnosis present

## 2019-09-10 DIAGNOSIS — Z801 Family history of malignant neoplasm of trachea, bronchus and lung: Secondary | ICD-10-CM

## 2019-09-10 DIAGNOSIS — E876 Hypokalemia: Secondary | ICD-10-CM | POA: Diagnosis not present

## 2019-09-10 DIAGNOSIS — F419 Anxiety disorder, unspecified: Secondary | ICD-10-CM | POA: Diagnosis present

## 2019-09-10 DIAGNOSIS — I480 Paroxysmal atrial fibrillation: Secondary | ICD-10-CM | POA: Diagnosis present

## 2019-09-10 DIAGNOSIS — Z955 Presence of coronary angioplasty implant and graft: Secondary | ICD-10-CM | POA: Diagnosis not present

## 2019-09-10 DIAGNOSIS — Z87891 Personal history of nicotine dependence: Secondary | ICD-10-CM

## 2019-09-10 DIAGNOSIS — Z823 Family history of stroke: Secondary | ICD-10-CM

## 2019-09-10 DIAGNOSIS — Z79899 Other long term (current) drug therapy: Secondary | ICD-10-CM

## 2019-09-10 DIAGNOSIS — Z20822 Contact with and (suspected) exposure to covid-19: Secondary | ICD-10-CM | POA: Diagnosis present

## 2019-09-10 DIAGNOSIS — R9431 Abnormal electrocardiogram [ECG] [EKG]: Secondary | ICD-10-CM | POA: Diagnosis present

## 2019-09-10 DIAGNOSIS — I739 Peripheral vascular disease, unspecified: Secondary | ICD-10-CM | POA: Diagnosis not present

## 2019-09-10 DIAGNOSIS — Z9049 Acquired absence of other specified parts of digestive tract: Secondary | ICD-10-CM

## 2019-09-10 DIAGNOSIS — I25118 Atherosclerotic heart disease of native coronary artery with other forms of angina pectoris: Secondary | ICD-10-CM | POA: Diagnosis not present

## 2019-09-10 DIAGNOSIS — R001 Bradycardia, unspecified: Secondary | ICD-10-CM | POA: Diagnosis present

## 2019-09-10 DIAGNOSIS — Z981 Arthrodesis status: Secondary | ICD-10-CM

## 2019-09-10 DIAGNOSIS — E785 Hyperlipidemia, unspecified: Secondary | ICD-10-CM | POA: Diagnosis present

## 2019-09-10 DIAGNOSIS — Z888 Allergy status to other drugs, medicaments and biological substances status: Secondary | ICD-10-CM | POA: Diagnosis not present

## 2019-09-10 DIAGNOSIS — I5023 Acute on chronic systolic (congestive) heart failure: Secondary | ICD-10-CM | POA: Diagnosis not present

## 2019-09-10 DIAGNOSIS — I25810 Atherosclerosis of coronary artery bypass graft(s) without angina pectoris: Secondary | ICD-10-CM | POA: Diagnosis present

## 2019-09-10 DIAGNOSIS — J9811 Atelectasis: Secondary | ICD-10-CM | POA: Diagnosis not present

## 2019-09-10 DIAGNOSIS — R0602 Shortness of breath: Secondary | ICD-10-CM | POA: Diagnosis not present

## 2019-09-10 DIAGNOSIS — I252 Old myocardial infarction: Secondary | ICD-10-CM | POA: Diagnosis not present

## 2019-09-10 DIAGNOSIS — I5043 Acute on chronic combined systolic (congestive) and diastolic (congestive) heart failure: Secondary | ICD-10-CM | POA: Diagnosis present

## 2019-09-10 DIAGNOSIS — Z832 Family history of diseases of the blood and blood-forming organs and certain disorders involving the immune mechanism: Secondary | ICD-10-CM

## 2019-09-10 DIAGNOSIS — K219 Gastro-esophageal reflux disease without esophagitis: Secondary | ICD-10-CM | POA: Diagnosis present

## 2019-09-10 DIAGNOSIS — Z96641 Presence of right artificial hip joint: Secondary | ICD-10-CM | POA: Diagnosis present

## 2019-09-10 DIAGNOSIS — M545 Low back pain: Secondary | ICD-10-CM | POA: Diagnosis present

## 2019-09-10 DIAGNOSIS — Z7982 Long term (current) use of aspirin: Secondary | ICD-10-CM

## 2019-09-10 LAB — CBC WITH DIFFERENTIAL/PLATELET
Abs Immature Granulocytes: 0.03 10*3/uL (ref 0.00–0.07)
Basophils Absolute: 0 10*3/uL (ref 0.0–0.1)
Basophils Relative: 1 %
Eosinophils Absolute: 0 10*3/uL (ref 0.0–0.5)
Eosinophils Relative: 1 %
HCT: 30.9 % — ABNORMAL LOW (ref 36.0–46.0)
Hemoglobin: 8.9 g/dL — ABNORMAL LOW (ref 12.0–15.0)
Immature Granulocytes: 1 %
Lymphocytes Relative: 18 %
Lymphs Abs: 0.7 10*3/uL (ref 0.7–4.0)
MCH: 28.5 pg (ref 26.0–34.0)
MCHC: 28.8 g/dL — ABNORMAL LOW (ref 30.0–36.0)
MCV: 99 fL (ref 80.0–100.0)
Monocytes Absolute: 0.2 10*3/uL (ref 0.1–1.0)
Monocytes Relative: 4 %
Neutro Abs: 2.7 10*3/uL (ref 1.7–7.7)
Neutrophils Relative %: 75 %
Platelets: 176 10*3/uL (ref 150–400)
RBC: 3.12 MIL/uL — ABNORMAL LOW (ref 3.87–5.11)
RDW: 15.9 % — ABNORMAL HIGH (ref 11.5–15.5)
WBC: 3.6 10*3/uL — ABNORMAL LOW (ref 4.0–10.5)
nRBC: 0 % (ref 0.0–0.2)

## 2019-09-10 LAB — COMPREHENSIVE METABOLIC PANEL
ALT: 11 U/L (ref 0–44)
AST: 12 U/L — ABNORMAL LOW (ref 15–41)
Albumin: 2.9 g/dL — ABNORMAL LOW (ref 3.5–5.0)
Alkaline Phosphatase: 72 U/L (ref 38–126)
Anion gap: 11 (ref 5–15)
BUN: 12 mg/dL (ref 8–23)
CO2: 22 mmol/L (ref 22–32)
Calcium: 8.6 mg/dL — ABNORMAL LOW (ref 8.9–10.3)
Chloride: 106 mmol/L (ref 98–111)
Creatinine, Ser: 0.66 mg/dL (ref 0.44–1.00)
GFR calc Af Amer: >60 mL/min (ref >60)
GFR calc non Af Amer: >60 mL/min (ref >60)
Glucose, Bld: 98 mg/dL (ref 70–99)
Potassium: 3.3 mmol/L — ABNORMAL LOW (ref 3.5–5.1)
Sodium: 139 mmol/L (ref 135–145)
Total Bilirubin: 0.8 mg/dL (ref 0.3–1.2)
Total Protein: 6.3 g/dL — ABNORMAL LOW (ref 6.5–8.1)

## 2019-09-10 LAB — TROPONIN I (HIGH SENSITIVITY)
Troponin I (High Sensitivity): 32 ng/L — ABNORMAL HIGH (ref <18)
Troponin I (High Sensitivity): 32 ng/L — ABNORMAL HIGH (ref <18)

## 2019-09-10 LAB — HIV ANTIBODY (ROUTINE TESTING W REFLEX): HIV Screen 4th Generation wRfx: NONREACTIVE

## 2019-09-10 LAB — SARS CORONAVIRUS 2 BY RT PCR (HOSPITAL ORDER, PERFORMED IN ~~LOC~~ HOSPITAL LAB): SARS Coronavirus 2: NEGATIVE

## 2019-09-10 LAB — BRAIN NATRIURETIC PEPTIDE: B Natriuretic Peptide: 2476.7 pg/mL — ABNORMAL HIGH (ref 0.0–100.0)

## 2019-09-10 LAB — MAGNESIUM: Magnesium: 1.6 mg/dL — ABNORMAL LOW (ref 1.7–2.4)

## 2019-09-10 MED ORDER — CLOPIDOGREL BISULFATE 75 MG PO TABS
75.0000 mg | ORAL_TABLET | Freq: Every day | ORAL | Status: DC
  Administered 2019-09-10 – 2019-09-12 (×3): 75 mg via ORAL
  Filled 2019-09-10 (×3): qty 1

## 2019-09-10 MED ORDER — FUROSEMIDE 10 MG/ML IJ SOLN
40.0000 mg | Freq: Two times a day (BID) | INTRAMUSCULAR | Status: DC
  Administered 2019-09-10 – 2019-09-12 (×4): 40 mg via INTRAVENOUS
  Filled 2019-09-10 (×4): qty 4

## 2019-09-10 MED ORDER — ACETAMINOPHEN 325 MG PO TABS
650.0000 mg | ORAL_TABLET | ORAL | Status: DC | PRN
  Administered 2019-09-13: 650 mg via ORAL
  Filled 2019-09-10: qty 2

## 2019-09-10 MED ORDER — IRBESARTAN 150 MG PO TABS
150.0000 mg | ORAL_TABLET | Freq: Every day | ORAL | Status: DC
  Administered 2019-09-10 – 2019-09-12 (×3): 150 mg via ORAL
  Filled 2019-09-10 (×5): qty 1

## 2019-09-10 MED ORDER — PRAMIPEXOLE DIHYDROCHLORIDE 0.25 MG PO TABS
0.2500 mg | ORAL_TABLET | Freq: Every day | ORAL | Status: DC
  Administered 2019-09-10 – 2019-09-12 (×3): 0.25 mg via ORAL
  Filled 2019-09-10 (×4): qty 1

## 2019-09-10 MED ORDER — GUAIFENESIN ER 600 MG PO TB12
600.0000 mg | ORAL_TABLET | Freq: Two times a day (BID) | ORAL | Status: DC
  Administered 2019-09-10 – 2019-09-13 (×6): 600 mg via ORAL
  Filled 2019-09-10 (×6): qty 1

## 2019-09-10 MED ORDER — FERROUS SULFATE 325 (65 FE) MG PO TABS
325.0000 mg | ORAL_TABLET | Freq: Two times a day (BID) | ORAL | Status: DC
  Administered 2019-09-11 – 2019-09-13 (×3): 325 mg via ORAL
  Filled 2019-09-10 (×3): qty 1

## 2019-09-10 MED ORDER — IPRATROPIUM-ALBUTEROL 0.5-2.5 (3) MG/3ML IN SOLN: 3.0000 mL | Freq: Four times a day (QID) | RESPIRATORY_TRACT | Status: DC | PRN

## 2019-09-10 MED ORDER — SODIUM CHLORIDE 0.9 % IV SOLN
1.0000 g | INTRAVENOUS | Status: DC
  Administered 2019-09-11 – 2019-09-13 (×3): 1 g via INTRAVENOUS
  Filled 2019-09-10 (×3): qty 10

## 2019-09-10 MED ORDER — SODIUM CHLORIDE 0.9% FLUSH: 3.0000 mL | INTRAVENOUS | Status: DC | PRN

## 2019-09-10 MED ORDER — BUPROPION HCL ER (XL) 150 MG PO TB24
150.0000 mg | ORAL_TABLET | Freq: Every day | ORAL | Status: DC
  Administered 2019-09-10 – 2019-09-13 (×4): 150 mg via ORAL
  Filled 2019-09-10 (×4): qty 1

## 2019-09-10 MED ORDER — MAGNESIUM OXIDE 400 (241.3 MG) MG PO TABS
800.0000 mg | ORAL_TABLET | Freq: Once | ORAL | Status: AC
  Administered 2019-09-10: 800 mg via ORAL
  Filled 2019-09-10: qty 2

## 2019-09-10 MED ORDER — SODIUM CHLORIDE 0.9 % IV SOLN
1.0000 g | Freq: Once | INTRAVENOUS | Status: AC
  Administered 2019-09-10: 1 g via INTRAVENOUS
  Filled 2019-09-10: qty 10

## 2019-09-10 MED ORDER — APIXABAN 5 MG PO TABS
5.0000 mg | ORAL_TABLET | Freq: Two times a day (BID) | ORAL | Status: DC
  Administered 2019-09-10 – 2019-09-13 (×6): 5 mg via ORAL
  Filled 2019-09-10 (×7): qty 1

## 2019-09-10 MED ORDER — ASPIRIN 81 MG PO CHEW
81.0000 mg | CHEWABLE_TABLET | Freq: Every day | ORAL | Status: DC
  Administered 2019-09-10 – 2019-09-13 (×4): 81 mg via ORAL
  Filled 2019-09-10 (×4): qty 1

## 2019-09-10 MED ORDER — EZETIMIBE 10 MG PO TABS
10.0000 mg | ORAL_TABLET | Freq: Every day | ORAL | Status: DC
  Administered 2019-09-10 – 2019-09-13 (×4): 10 mg via ORAL
  Filled 2019-09-10 (×6): qty 1

## 2019-09-10 MED ORDER — DICLOFENAC SODIUM 1 % EX GEL
2.0000 g | Freq: Four times a day (QID) | CUTANEOUS | Status: DC | PRN
  Filled 2019-09-10: qty 100

## 2019-09-10 MED ORDER — SODIUM CHLORIDE 0.9 % IV SOLN
250.0000 mL | INTRAVENOUS | Status: DC | PRN
  Administered 2019-09-11: 250 mL via INTRAVENOUS

## 2019-09-10 MED ORDER — SODIUM CHLORIDE 0.9 % IV SOLN
500.0000 mg | Freq: Once | INTRAVENOUS | Status: AC
  Administered 2019-09-10: 500 mg via INTRAVENOUS
  Filled 2019-09-10: qty 500

## 2019-09-10 MED ORDER — ATORVASTATIN CALCIUM 80 MG PO TABS
80.0000 mg | ORAL_TABLET | Freq: Every day | ORAL | Status: DC
  Administered 2019-09-10 – 2019-09-12 (×3): 80 mg via ORAL
  Filled 2019-09-10 (×4): qty 1

## 2019-09-10 MED ORDER — POTASSIUM CHLORIDE CRYS ER 20 MEQ PO TBCR
40.0000 meq | EXTENDED_RELEASE_TABLET | ORAL | Status: AC
  Administered 2019-09-10: 40 meq via ORAL
  Filled 2019-09-10: qty 2

## 2019-09-10 MED ORDER — AMIODARONE HCL 100 MG PO TABS
100.0000 mg | ORAL_TABLET | Freq: Every day | ORAL | Status: DC
  Administered 2019-09-10 – 2019-09-13 (×4): 100 mg via ORAL
  Filled 2019-09-10 (×5): qty 1

## 2019-09-10 MED ORDER — METOPROLOL TARTRATE 50 MG PO TABS
50.0000 mg | ORAL_TABLET | Freq: Two times a day (BID) | ORAL | Status: DC
  Administered 2019-09-10 – 2019-09-11 (×2): 50 mg via ORAL
  Filled 2019-09-10: qty 1
  Filled 2019-09-10: qty 2

## 2019-09-10 MED ORDER — OXYCODONE HCL 5 MG PO TABS
10.0000 mg | ORAL_TABLET | Freq: Three times a day (TID) | ORAL | Status: DC
  Administered 2019-09-10 – 2019-09-13 (×9): 10 mg via ORAL
  Filled 2019-09-10 (×11): qty 2

## 2019-09-10 MED ORDER — SODIUM CHLORIDE 0.9% FLUSH
3.0000 mL | Freq: Two times a day (BID) | INTRAVENOUS | Status: DC
  Administered 2019-09-10 – 2019-09-13 (×5): 3 mL via INTRAVENOUS

## 2019-09-10 MED ORDER — ALPRAZOLAM 0.5 MG PO TABS
0.5000 mg | ORAL_TABLET | Freq: Three times a day (TID) | ORAL | Status: DC | PRN
  Administered 2019-09-10 – 2019-09-12 (×4): 0.5 mg via ORAL
  Filled 2019-09-10 (×3): qty 1
  Filled 2019-09-10: qty 2

## 2019-09-10 NOTE — H&P (Signed)
History and Physical    CARLETHIA MESQUITA [name]:[id number] DOB: [birth year] DOA: 09/10/2019  Referring MD/NP/PA: Davonna Belling, MD PCP: Josetta Huddle, MD  Patient coming from: Home via EMS  Chief Complaint: Shortness of breath  I have personally briefly reviewed patient's old medical records in [place]   HPI: Wendy [name] is a 69 y.o. female with medical history significant of hypertension, hyperlipidemia, PAF, CAD s/p CABG, PVD, and COPD who presents with complaints of progressively worsening difficulty breathing over the last 4 days.  She has had very little cough that has nonproductive.  Denies having any change to her weight, leg swelling, chest pain, orthopnea, calf pain, or recent change in her medications.  Normally she is not on oxygen at home and has had both of her COVID-19 vaccinations.  She reports being compliant with taking her Eliquis and has not missed a dose.  At home she had tried using inhalers without any improvement in her symptoms.  ED Course: Upon admission into the emergency department patient was seen to be afebrile with blood pressures elevated up to 180/72, and O2 saturations as low as 84% on room air with improvement on 2 L nasal cannula oxygen.  Labs significant for WBC 3.6, hemoglobin 8.9, potassium 3.3, BNP 2476.7, and troponin 32.  Chest x-ray showed left upper lobe opacity.  Patient was started on empiric antibiotics of Rocephin and azithromycin for suspected community-acquired pneumonia.  TRH called to admit.  Review of Systems  Constitutional: Positive for malaise/fatigue. Negative for fever.  Eyes: Negative for blurred vision and photophobia.  Respiratory: Positive for cough and shortness of breath. Negative for sputum production.   Cardiovascular: Negative for chest pain and leg swelling.    Past Medical History:  Diagnosis Date  . Anemia   . Anginal pain ([place])    jaw pain 07/2016, s/p 07/15/16 nuclear stress test  . Anxiety   . Arthritis     "lower back" (08/24/2017)  . Asthma   . [place] Obstructive Pulmonary Disease   . Chronic back pain   . Chronic combined systolic and diastolic CHF    EF 0/35: 46-56 // EF 4/21: 55-60 // EF 4/21: 20-25  . Chronic leg pain   . Chronic lower back pain   . Coronary Artery Disease    a. s/p NSTEMI >> CABG 1998 // s/p PCI 2013 // [place] 01/2015: DES to SVG-RCA.// Cath 05/2019: all grafts occluded; mod non-obs LAD disease w L-L and L-R collats - med Rx // post Ao-Bifem bypass MI c/b PEA, CGS requiring Impella >> anatomy unchanged - Med Rx  . Depression   . Fall 08/2016  . Gastroesophageal reflux disease   . Glucose intolerance   . Headache    "a few/month" (08/24/2017)  . HLD (hyperlipidemia)   . Hx of DVT X 3   RLE  . Hypertension   . Myocardial infarction ([place]) 1999  . OSA on CPAP   . Paroxysmal atrial fibrillation    a. 09/2013 post-op b. 01/2015 // Apixaban // recurrent AFib post MI after Ao-Bifem in 06/2019 >> Amio Rx  . Peripheral artery disease    s/p L fem-fem bypass // s/p Ao-Bifem bypass in 06/2019  . Peripheral neuropathy   . Pneumonia   . Shortness of breath    when walking  . Wears glasses     Past Surgical History:  Procedure Laterality Date  . ABDOMINAL AORTOGRAM W/LOWER EXTREMITY Bilateral 04/29/2019   Procedure: ABDOMINAL AORTOGRAM W/LOWER EXTREMITY;  Surgeon:  Waynetta Sandy, MD;  Location: [facility];  Service: Cardiovascular;  Laterality: Bilateral;  . ANTERIOR CERVICAL DECOMP/DISCECTOMY FUSION  1991  . AORTA - BILATERAL FEMORAL ARTERY BYPASS GRAFT N/A 06/18/2019   Procedure: AORTA BIFEMORAL BYPASS GRAFT;  Surgeon: Waynetta Sandy, MD;  Location: [facility];  Service: Vascular;  Laterality: N/A;  . ARTERY REPAIR Left 06/30/2019   Procedure: LEFT BRACHIAL ARTERY REPAIR, EVACUATION OF HEMATOMA;  Surgeon: Serafina Mitchell, MD;  Location: [facility];  Service: Vascular;  Laterality: Left;  . BACK SURGERY    . CARDIAC CATHETERIZATION N/A 01/22/2015    Procedure: Left Heart Cath and Coronary Angiography;  Surgeon: Troy Sine, MD;  Location: [facility];  Service: Cardiovascular;  Laterality: N/A;  . CARDIAC CATHETERIZATION N/A 01/22/2015   Procedure: Coronary Stent Intervention;  Surgeon: Troy Sine, MD;  Location: [facility];  Service: Cardiovascular;  Laterality: N/A;  3.0x12 xience prox SVG to RCA  . CARDIAC CATHETERIZATION  03/09/2000   hx/notes 03/20/2000  . CHOLECYSTECTOMY N/A 09/16/2013   Procedure: LAPAROSCOPIC CHOLECYSTECTOMY CONVERTED TO OPEN CHOLECYSTECTOMY WITH CHOLANGIOGRAM;  Surgeon: [name], MD;  Location: [facility];  Service: General;  Laterality: N/A;  . CORONARY ANGIOGRAPHY N/A 06/27/2019   Procedure: CORONARY ANGIOGRAPHY (CATH LAB);  Surgeon: Sherren Mocha, MD;  Location: [facility];  Service: Cardiovascular;  Laterality: N/A;  . CORONARY ANGIOPLASTY WITH STENT PLACEMENT  11/2011, 02/2012, 01/2015   DES to SVG-RCA both times, In [place], [place], Dr. Bruce Donath  . CORONARY ARTERY BYPASS GRAFT  1998   LIMA-LAD, SVG-OM, SVG-RCA  . ERCP N/A 09/19/2013   Procedure: ENDOSCOPIC RETROGRADE CHOLANGIOPANCREATOGRAPHY (ERCP);  Surgeon: [name], MD;  Location: [facility];  Service: Endoscopy;  Laterality: N/A;  . FINGER SURGERY Right    ring finger "fused"  . FRACTURE SURGERY    . IRRIGATION AND DEBRIDEMENT ABSCESS Right 10/17/2013   Procedure: INCISION AND DRAINAGE RIGHT SUBCOSTAL WOUND;  Surgeon: [name], MD;  Location: [facility];  Service: General;  Laterality: Right;  . JOINT REPLACEMENT    . KNEE ARTHROSCOPY Right 1998  . LEFT HEART CATH AND CORS/GRAFTS ANGIOGRAPHY N/A 07/25/2016   Procedure: Left Heart Cath and Cors/Grafts Angiography;  Surgeon: Jettie Booze, MD;  Location: [facility];  Service: Cardiovascular;  Laterality: N/A;  . LEFT HEART CATH AND CORS/GRAFTS ANGIOGRAPHY N/A 06/05/2019   Procedure: LEFT HEART CATH AND CORS/GRAFTS ANGIOGRAPHY;  Surgeon: Burnell Blanks, MD;  Location: [facility];  Service: Cardiovascular;  Laterality: N/A;  . LEFT HEART CATHETERIZATION WITH CORONARY/GRAFT ANGIOGRAM N/A 07/19/2013   Procedure: LEFT HEART CATHETERIZATION WITH Beatrix Fetters;  Surgeon: Leonie Man, MD;  Location[facility];  Service: Cardiovascular;  Laterality: N/A;  . MASS EXCISION Right 06/25/2014   Procedure: EXCISION BUTTOCK MASS ;  Surgeon: Coralie Keens, MD;  Location: [facility];  Service: General;  Laterality: Right;  . ORIF HUMERUS FRACTURE Left 08/27/2013   Procedure: OPEN REDUCTION INTERNAL FIXATION (ORIF) LEFT HUMERUS ;  Surgeon: Rozanna Box, MD;  Location: [facility];  Service: Orthopedics;  Laterality: Left;  . PLACEMENT OF IMPELLA LEFT VENTRICULAR ASSIST DEVICE N/A 06/24/2019   Procedure: PLACEMENT OF IMPELLA LEFT VENTRICULAR ASSIST DEVICE;  Surgeon: Wonda Olds, MD;  Location: [facility];  Service: Open Heart Surgery;  Laterality: N/A;  . [place]  . REMOVAL OF IMPELLA LEFT VENTRICULAR ASSIST DEVICE Right 06/28/2019  Procedure: REMOVAL OF IMPELLA LEFT VENTRICULAR ASSIST DEVICE;  Surgeon: Wonda Olds, MD;  Location: [facility];  Service: Open Heart Surgery;  Laterality: Right;  . THROMBECTOMY / EMBOLECTOMY FEMORAL ARTERY Right    hx/notes 03/20/2000  . TOTAL HIP ARTHROPLASTY Right 07/19/2016   Procedure: TOTAL HIP ARTHROPLASTY ANTERIOR APPROACH;  Surgeon: [name], MD;  Location: [facility];  Service: Orthopedics;  Laterality: Right;  . TOTAL HIP REVISION Right 08/22/2017   Procedure: TOTAL HIP REVISION;  Surgeon: [name], MD;  Location: [facility];  Service: Orthopedics;  Laterality: Right;     reports that she quit smoking about 21 years ago. Her smoking use included cigarettes. She has a 17.00 pack-year smoking history. She has never used smokeless tobacco. She reports previous alcohol use. She reports current drug use. Drug: Marijuana.  Allergies   Allergen Reactions  . Benadryl [Diphenhydramine] Shortness Of Breath  . Penicillins Diarrhea    Did it involve swelling of the face/tongue/throat, SOB, or low BP? No Did it involve sudden or severe rash/hives, skin peeling, or any reaction on the inside of your mouth or nose? No Did you need to seek medical attention at a hospital or doctor's office? No When did it last happen?2 months If all above answers are "NO", may proceed with cephalosporin use.  . Adhesive [Tape] Other (See Comments)    Burning of skin  . Amoxicillin Diarrhea and Other (See Comments)    Has patient had a PCN reaction causing immediate rash, facial/tongue/throat swelling, SOB or lightheadedness with hypotension: No Has patient had a PCN reaction causing severe rash involving mucus membranes or skin necrosis: No Has patient had a PCN reaction that required hospitalization No Has patient had a PCN reaction occurring within the last 10 years: Yes -- noted rxn as diarrhea If all of the above answers are "NO", then may proceed with Cephalosporin use.   . Cyclobenzaprine Other (See Comments)    Causes restless leg syndrome Restless syndrome  . Hydrocodone Itching    Family History  Problem Relation Age of Onset  . Lung cancer Mother   . Clotting disorder Father   . Lung cancer Sister   . Heart disease Brother   . Heart disease Brother   . Colon cancer Neg Hx   . Breast cancer Neg Hx     Prior to Admission medications   Medication Sig Start Date End Date Taking? Authorizing Provider  acetaminophen (TYLENOL) 500 MG tablet Take 500-1,000 mg by mouth every 6 (six) hours as needed (for pain.).   Yes [provider]  albuterol (PROVENTIL HFA;VENTOLIN HFA) 108 (90 Base) MCG/ACT inhaler Inhale 2 puffs into the lungs every 6 (six) hours as needed for wheezing or shortness of breath. 01/01/18  Yes Bhagat, Bhavinkumar, PA  ALPRAZolam (XANAX) 0.5 MG tablet Take 0.5 mg by mouth 3 (three) times daily as  needed for anxiety.   Yes [provider]  amiodarone (PACERONE) 100 MG tablet Take 1 tablet (100 mg total) by mouth daily. 08/15/19  Yes Nahser, Wonda Cheng, MD  apixaban (ELIQUIS) 5 MG TABS tablet Take 1 tablet (5 mg total) by mouth 2 (two) times daily. 11/20/18  Yes Nahser, Wonda Cheng, MD  aspirin 81 MG chewable tablet Chew 1 tablet (81 mg total) by mouth daily. 07/10/19  Yes Dagoberto Ligas, PA-C  atorvastatin (LIPITOR) 80 MG tablet TAKE ONE TABLET BY MOUTH DAILY AT 6PM Patient taking differently: Take 80 mg by mouth daily at 6 PM. TAKE ONE  TABLET BY MOUTH DAILY AT 6PM 11/20/18  Yes Nahser, Wonda Cheng, MD  buPROPion (WELLBUTRIN XL) 150 MG 24 hr tablet Take 150 mg by mouth daily.   Yes [provider]  clopidogrel (PLAVIX) 75 MG tablet Take 75 mg by mouth daily.   Yes [provider]  diclofenac Sodium (VOLTAREN) 1 % GEL Apply 1 g topically 4 (four) times daily as needed for pain. 02/15/19  Yes [provider]  ezetimibe (ZETIA) 10 MG tablet Take 10 mg by mouth daily.   Yes [provider]  furosemide (LASIX) 20 MG tablet Take 1 tablet (20 mg total) by mouth daily. 07/13/19  Yes Ulyses Amor, PA-C  gabapentin (NEURONTIN) 300 MG capsule Take 300 mg by mouth 3 (three) times daily as needed for pain. 02/15/19  Yes [provider]  metoprolol tartrate (LOPRESSOR) 50 MG tablet Take 50 mg by mouth in the morning and at bedtime. 08/14/19  Yes [provider]  nitroGLYCERIN (NITROSTAT) 0.4 MG SL tablet Place 1 tablet (0.4 mg total) under the tongue every 5 (five) minutes as needed for chest pain. 11/20/18  Yes Nahser, Wonda Cheng, MD  oxyCODONE 10 MG TABS Take 1 tablet (10 mg total) by mouth 3 (three) times daily. 07/12/19  Yes Ulyses Amor, PA-C  pantoprazole (PROTONIX) 40 MG tablet Take 1 tablet (40 mg total) by mouth daily. 11/20/18  Yes Nahser, Wonda Cheng, MD  pramipexole (MIRAPEX) 0.25 MG tablet Take 0.25 mg by mouth at bedtime.  02/18/19  Yes [provider]  promethazine (PHENERGAN) 12.5 MG tablet Take 1 tablet (12.5 mg total) by mouth every 6 (six) hours as needed for nausea or vomiting. 08/02/19  Yes Waynetta Sandy, MD  ranolazine (RANEXA) 1000 MG SR tablet Take 1,000 mg by mouth 2 (two) times daily.   Yes [provider]  sertraline (ZOLOFT) 100 MG tablet Take 150 mg by mouth at bedtime.    Yes [provider]  spironolactone (ALDACTONE) 25 MG tablet Take 1 tablet (25 mg total) by mouth daily. 07/13/19  Yes Ulyses Amor, PA-C  valsartan-hydrochlorothiazide (DIOVAN-HCT) 160-12.5 MG tablet Take 1 tablet by mouth daily. 08/14/19  Yes [provider]  losartan (COZAAR) 25 MG tablet Take 1 tablet (25 mg total) by mouth daily. Patient not taking: Reported on 09/10/2019 07/13/19   Ulyses Amor, PA-C    Physical Exam:  Constitutional: Elderly female who appears to be no acute distress at this Vitals:   09/10/19 1210 09/10/19 1216  BP:  (!) 180/72  Pulse: 65   Resp: 20   Temp: 99 F (37.2 C)   TempSrc: Oral   SpO2: 98%    Eyes: PERRL, lids and conjunctivae normal ENMT: Mucous membranes are moist. Posterior pharynx clear of any exudate or lesions.   Neck: normal, supple, no masses, no thyromegaly Respiratory: Normal respiratory effort with coarse crackles appreciated both lung fields.  Patient currently on 2 L of oxygen with O2 saturations currently maintained. Cardiovascular: Regular rate and rhythm, no murmurs / rubs / gallops. No extremity edema. 2+ pedal pulses. No carotid bruits.  Abdomen: no tenderness, no masses palpated. No hepatosplenomegaly. Bowel sounds positive.  Musculoskeletal: no clubbing / cyanosis. No joint deformity upper and lower extremities. Good ROM, no contractures. Normal muscle tone.  Skin: Chronic abdominal wound present with packing Neurologic: CN 2-12 grossly intact. Sensation intact, DTR normal. Strength 5/5 in all 4.  Psychiatric: Normal judgment and insight. Alert  and oriented x 3. Normal  mood.     Labs on Admission: I have personally reviewed following labs and imaging studies  CBC: Recent Labs  Lab 09/10/19 1305  WBC 3.6*  NEUTROABS 2.7  HGB 8.9*  HCT 30.9*  MCV 99.0  PLT 161   Basic Metabolic Panel: Recent Labs  Lab 09/10/19 1305  NA 139  K 3.3*  CL 106  CO2 22  GLUCOSE 98  BUN 12  CREATININE 0.66  CALCIUM 8.6*   GFR: CrCl cannot be calculated (Unknown ideal weight.). Liver Function Tests: Recent Labs  Lab 09/10/19 1305  AST 12*  ALT 11  ALKPHOS 72  BILITOT 0.8  PROT 6.3*  ALBUMIN 2.9*   No results for input(s): LIPASE, AMYLASE in the last 168 hours. No results for input(s): AMMONIA in the last 168 hours. Coagulation Profile: No results for input(s): INR, PROTIME in the last 168 hours. Cardiac Enzymes: No results for input(s): CKTOTAL, CKMB, CKMBINDEX, TROPONINI in the last 168 hours. BNP (last 3 results) No results for input(s): PROBNP in the last 8760 hours. HbA1C: No results for input(s): HGBA1C in the last 72 hours. CBG: No results for input(s): GLUCAP in the last 168 hours. Lipid Profile: No results for input(s): CHOL, HDL, LDLCALC, TRIG, CHOLHDL, LDLDIRECT in the last 72 hours. Thyroid Function Tests: No results for input(s): TSH, T4TOTAL, FREET4, T3FREE, THYROIDAB in the last 72 hours. Anemia Panel: No results for input(s): VITAMINB12, FOLATE, FERRITIN, TIBC, IRON, RETICCTPCT in the last 72 hours. Urine analysis:    Component Value Date/Time   COLORURINE YELLOW 07/08/2019 1155   APPEARANCEUR CLEAR 07/08/2019 1155   LABSPEC 1.005 07/08/2019 1155   PHURINE 9.0 (H) 07/08/2019 1155   GLUCOSEU NEGATIVE 07/08/2019 1155   HGBUR SMALL (A) 07/08/2019 1155   BILIRUBINUR NEGATIVE 07/08/2019 1155   KETONESUR NEGATIVE 07/08/2019 1155   PROTEINUR NEGATIVE 07/08/2019 1155   UROBILINOGEN 1.0 09/24/2013 1951   NITRITE NEGATIVE 07/08/2019 1155   LEUKOCYTESUR LARGE (A) 07/08/2019 1155   Sepsis Labs: No  results found for this or any previous visit (from the past 240 hour(s)).   Radiological Exams on Admission: DG Chest Portable 1 View  Result Date: 09/10/2019 CLINICAL DATA:  Shortness of breath and chest pain EXAM: PORTABLE CHEST 1 VIEW COMPARISON:  Jul 21, 2019 FINDINGS: There is ill-defined airspace opacity in the left upper lobe. There is mild bibasilar atelectasis. Heart is upper normal in size with pulmonary vascularity normal. Patient is status post coronary artery bypass grafting. No adenopathy. There is postoperative change in the left humerus. IMPRESSION: Suspected pneumonia left upper lobe. Mild bibasilar atelectasis. Stable cardiac silhouette with postoperative changes. Followup PA and lateral chest radiographs recommended in 3-4 weeks following trial of antibiotic therapy to ensure resolution and exclude underlying malignancy. Electronically Signed   By: Lowella Grip III M.D.   On: 09/10/2019 12:55    EKG: Independently reviewed.  Sinus rhythm at 65 bpm with QTc prolonged at 559  Assessment/Plan  Acute respiratory metabolic Acute on chronic combined systolic and diastolic CHF: Patient presented with complaints of progressively worsening shortness of breath.  O2 saturations appear to have dropped as low as 84% with improvement on 2 L of oxygen.  BNP 2176.7.  Last EF noted to be 20-25% with left ventricle global hypokinesis in April of this year.  On physical exam patient does not appear grossly fluid overloaded, but does have crackles noted on exam of her lungs. -Admit to a cardiac telemetry bed -Heart failure orders set  initiated  -Continuous pulse  oximetry with nasal cannula oxygen as needed to keep O2 saturations >92% -Strict I&Os and daily weights -Elevate lower extremities -Lasix 40 mg IV Bid -Reassess in a.m. and adjust diuresis as needed. -Message sent for cardiology to eval in a.m.  Suspected community-acquired pneumonia: Chest x-ray concerning for left upper lobe  pneumonia. -Discontinued azithromycin due to prolonged QT interval -Continue Rocephin  -Mucinex  -Nebulizer breathing treatments as needed.  Prolonged QT interval: Acute.  QTc initially noted to be 559. -Correct any electrolyte abnormalities -Recheck QT interval in a.m.  Elevated troponin: Troponins were mildly elevated at 32.  Patient denies any complaints of chest pain. -Continue to monitor   Hypokalemia and hypomagnesemia: Acute potassium noted to be 3.3 and magnesium 1.6 on admission. -Give 800 mg of magnesium oxide p.o. and potassium chloride 40 mEq -Continue to monitor and replace as needed  COPD, without acute exacerbation: Patient does not appear to be wheezing on physical exam. -Continue DuoNebs as needed for shortness of breath/wheezing  Paroxysmal atrial fibrillation: Patient appears to be in sinus rhythm at this time. -Goal potassium 4 and magnesium 2 -Continue amiodarone and Eliquis  Peripheral vascular disease, history of CABG: Patient with history of aortoiliac occlusive disease status post aortobifemoral bypass with reimplantation of right profunda on aortobifemoral limb by Dr. Donzetta Matters on 06/18/2019. -Continue statin and aspirin  Essential hypertension: Home blood pressure medications include metoprolol 50 mg 2 times daily, Ranexa 1000 mg twice daily, valsartan-hydrochlorothiazide 160-12.5 mg daily. -Continue metoprolol and pharmacy substitution for valsartan -Ranexa due to prolonged QT interval  Anxiety: Home medications include Xanax 0.5 mg 3 times daily as needed anxiety, Wellbutrin 150 mg daily, and Zoloft 150 mg nightly. -Continue Wellbutrin and Xanax -Held Zoloft due to prolonged QT interval  Hyperlipidemia -Continue atorvastatin and Zetia  GERD -Hold Protonix due to prolonged QT interval  DVT prophylaxis: Eliquis Code Status: Full Disposition Plan: Possible discharge home in 1 to 2 days Consults called: Message sent for cardiology to evaluate in  a.m. Admission status: Inpatient  Norval Morton MD [facility] Pager [phone]   If 7PM-7AM, please contact night-coverage [email or web address] Password [email or web address]  09/10/2019, 2:41 PM

## 2019-09-10 NOTE — ED Provider Notes (Signed)
[facility] EMERGENCY DEPARTMENT Provider Note   CSN: [id number] Arrival date & time: 09/10/19  1203     History Chief Complaint  Patient presents with  . Shortness of Breath    Wendy [name] is a 69 y.o. female.  HPI  Patient presents with shortness of breath.  Reportedly had for the last 4 days.  Occasional cough she states when she takes her inhaler.  Denies production.  Denies chest pain.  Denies swelling in her legs.  Denies sick contacts.  Has had her Covid shots.  Sats reportedly 90% on room air but improved with nasal cannula oxygen.  Patient is not on oxygen at baseline.  No abdominal pain.  Does have a chronic wound on her abdomen that she packs.  States she sometimes has chest pain when she has atrial fibrillation.  Patient is on anticoagulation.     Past Medical History:  Diagnosis Date  . Anemia   . Anginal pain ([place])    jaw pain 07/2016, s/p 07/15/16 nuclear stress test  . Anxiety   . Arthritis    "lower back" (08/24/2017)  . Asthma   . [place] Obstructive Pulmonary Disease   . Chronic back pain   . Chronic combined systolic and diastolic CHF    EF 9/38: 18-29 // EF 4/21: 55-60 // EF 4/21: 20-25  . Chronic leg pain   . Chronic lower back pain   . Coronary Artery Disease    a. s/p NSTEMI >> CABG 1998 // s/p PCI 2013 // [facility] 01/2015: DES to SVG-RCA.// Cath 05/2019: all grafts occluded; mod non-obs LAD disease w L-L and L-R collats - med Rx // post Ao-Bifem bypass MI c/b PEA, CGS requiring Impella >> anatomy unchanged - Med Rx  . Depression   . Fall 08/2016  . Gastroesophageal reflux disease   . Glucose intolerance   . Headache    "a few/month" (08/24/2017)  . HLD (hyperlipidemia)   . Hx of DVT X 3   RLE  . Hypertension   . Myocardial infarction ([place]) 1999  . OSA on CPAP   . Paroxysmal atrial fibrillation    a. 09/2013 post-op b. 01/2015 // Apixaban // recurrent AFib post MI after Ao-Bifem in 06/2019 >> [facility] Rx  . Peripheral artery disease     s/p L fem-fem bypass // s/p Ao-Bifem bypass in 06/2019  . Peripheral neuropathy   . Pneumonia   . Shortness of breath    when walking  . Wears glasses     Patient Active Problem List   Diagnosis Date Noted  . Hematoma 07/21/2019  . Cardiogenic shock ([place])   . 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 Coronary artery disease of native artery of native heart with stable angina pectoris ([place])   . 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 Acute combined systolic and diastolic heart failure ([place]) 07/26/2016  . Troponin level elevated 07/26/2016  . Chronic anticoagulation 07/22/2016  . CAD S/P multiple PCIs-last one 2016 07/22/2016  . NSTEMI (non-ST elevated myocardial infarction) ([place]) 07/22/2016  . Primary osteoarthritis of right hip 06/27/2016  . Avascular necrosis of hip, right ([place]) 06/27/2016  . PVD (peripheral vascular disease) ([place])   . Essential (primary) hypertension 01/14/2015  . Coronary artery disease involving native coronary artery of native heart without angina pectoris 10/06/2014  . Acute angina ([place]) 05/12/2014  . Epigastric pain   . Chest pain with high risk of acute coronary syndrome 10/21/2013  . Acute  respiratory failure ([place]) 09/20/2013  . Nonspecific abnormal results of liver function study 09/17/2013  . Cholecystitis, acute 09/15/2013  . PAD (peripheral artery disease) ([place])   . COPD (chronic obstructive pulmonary disease) ([place])   . Peripheral neuropathy   . Chronic back pain   . Sleep apnea   . Anxiety   . GERD (gastroesophageal reflux disease)   . 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 Hypertension 07/20/2013  . HLD (hyperlipidemia) 07/20/2013  . Unstable angina ([place]) 07/19/2013  . Hx of CABG 07/19/2013    Past Surgical History:  Procedure Laterality Date  . ABDOMINAL AORTOGRAM W/LOWER EXTREMITY Bilateral 04/29/2019   Procedure: ABDOMINAL AORTOGRAM W/LOWER EXTREMITY;  Surgeon: Waynetta Sandy, MD;  Location: [facility];  Service: Cardiovascular;  Laterality: Bilateral;  . ANTERIOR CERVICAL DECOMP/DISCECTOMY FUSION  1991  . AORTA - BILATERAL FEMORAL ARTERY BYPASS GRAFT N/A 06/18/2019   Procedure: AORTA BIFEMORAL BYPASS GRAFT;  Surgeon: Waynetta Sandy, MD;  Location: [facility];  Service: Vascular;  Laterality: N/A;  . ARTERY REPAIR Left 06/30/2019   Procedure: LEFT BRACHIAL ARTERY REPAIR, EVACUATION OF HEMATOMA;  Surgeon: Serafina Mitchell, MD;  Location: [facility];  Service: Vascular;  Laterality: Left;  . BACK SURGERY    . CARDIAC CATHETERIZATION N/A 01/22/2015   Procedure: Left Heart Cath and Coronary Angiography;  Surgeon: Troy Sine, MD;  Location: [facility];  Service: Cardiovascular;  Laterality: N/A;  . CARDIAC CATHETERIZATION N/A 01/22/2015   Procedure: Coronary Stent Intervention;  Surgeon: Troy Sine, MD;  Location: [facility];  Service: Cardiovascular;  Laterality: N/A;  3.0x12 xience prox SVG to RCA  . CARDIAC CATHETERIZATION  03/09/2000   hx/notes 03/20/2000  . CHOLECYSTECTOMY N/A 09/16/2013   Procedure: LAPAROSCOPIC CHOLECYSTECTOMY CONVERTED TO OPEN CHOLECYSTECTOMY WITH CHOLANGIOGRAM;  Surgeon: [name], MD;  Location: [facility];  Service: General;  Laterality: N/A;  . CORONARY ANGIOGRAPHY N/A 06/27/2019   Procedure: CORONARY ANGIOGRAPHY (CATH LAB);  Surgeon: Sherren Mocha, MD;  Location: [facility];  Service: Cardiovascular;  Laterality: N/A;  . CORONARY ANGIOPLASTY WITH STENT PLACEMENT  11/2011, 02/2012, 01/2015   DES to SVG-RCA both times, In [place], [place], Dr. Bruce Donath  . CORONARY ARTERY BYPASS GRAFT  1998   LIMA-LAD, SVG-OM, SVG-RCA  . ERCP N/A 09/19/2013   Procedure: ENDOSCOPIC RETROGRADE CHOLANGIOPANCREATOGRAPHY (ERCP);  Surgeon: [name], MD;  Location: [facility];  Service: Endoscopy;  Laterality: N/A;  . FINGER SURGERY Right    ring finger "fused"  . FRACTURE SURGERY    . IRRIGATION AND DEBRIDEMENT ABSCESS Right 10/17/2013    Procedure: INCISION AND DRAINAGE RIGHT SUBCOSTAL WOUND;  Surgeon: [name], MD;  Location: [facility];  Service: General;  Laterality: Right;  . JOINT REPLACEMENT    . KNEE ARTHROSCOPY Right 1998  . LEFT HEART CATH AND CORS/GRAFTS ANGIOGRAPHY N/A 07/25/2016   Procedure: Left Heart Cath and Cors/Grafts Angiography;  Surgeon: Jettie Booze, MD;  Location: [facility];  Service: Cardiovascular;  Laterality: N/A;  . LEFT HEART CATH AND CORS/GRAFTS ANGIOGRAPHY N/A 06/05/2019   Procedure: LEFT HEART CATH AND CORS/GRAFTS ANGIOGRAPHY;  Surgeon: Burnell Blanks, MD;  Location: [facility];  Service: Cardiovascular;  Laterality: N/A;  . LEFT HEART CATHETERIZATION WITH CORONARY/GRAFT ANGIOGRAM N/A 07/19/2013   Procedure: LEFT HEART CATHETERIZATION WITH Beatrix Fetters;  Surgeon: Leonie Man, MD;  Location[facility];  Service: Cardiovascular;  Laterality: N/A;  . MASS EXCISION Right 06/25/2014  Procedure: EXCISION BUTTOCK MASS ;  Surgeon: Coralie Keens, MD;  Location: [facility];  Service: General;  Laterality: Right;  . ORIF HUMERUS FRACTURE Left 08/27/2013   Procedure: OPEN REDUCTION INTERNAL FIXATION (ORIF) LEFT HUMERUS ;  Surgeon: Rozanna Box, MD;  Location: [facility];  Service: Orthopedics;  Laterality: Left;  . PLACEMENT OF IMPELLA LEFT VENTRICULAR ASSIST DEVICE N/A 06/24/2019   Procedure: PLACEMENT OF IMPELLA LEFT VENTRICULAR ASSIST DEVICE;  Surgeon: Wonda Olds, MD;  Location: [facility];  Service: Open Heart Surgery;  Laterality: N/A;  . [place]  . REMOVAL OF IMPELLA LEFT VENTRICULAR ASSIST DEVICE Right 06/28/2019   Procedure: REMOVAL OF IMPELLA LEFT VENTRICULAR ASSIST DEVICE;  Surgeon: Wonda Olds, MD;  Location: [facility];  Service: Open Heart Surgery;  Laterality: Right;  . THROMBECTOMY / EMBOLECTOMY FEMORAL ARTERY Right    hx/notes 03/20/2000  . TOTAL HIP ARTHROPLASTY Right 07/19/2016   Procedure: TOTAL  HIP ARTHROPLASTY ANTERIOR APPROACH;  Surgeon: [name], MD;  Location: [facility];  Service: Orthopedics;  Laterality: Right;  . TOTAL HIP REVISION Right 08/22/2017   Procedure: TOTAL HIP REVISION;  Surgeon: [name], MD;  Location: [facility];  Service: Orthopedics;  Laterality: Right;     OB History   No obstetric history on file.     Family History  Problem Relation Age of Onset  . Lung cancer Mother   . Clotting disorder Father   . Lung cancer Sister   . Heart disease Brother   . Heart disease Brother   . Colon cancer Neg Hx   . Breast cancer Neg Hx     Social History   Tobacco Use  . Smoking status: Former Smoker    Packs/day: 0.50    Years: 34.00    Pack years: 17.00    Types: Cigarettes    Quit date: 07/20/1998    Years since quitting: 21.1  . Smokeless tobacco: Never Used  Vaping Use  . Vaping Use: Never used  Substance Use Topics  . Alcohol use: Not Currently  . Drug use: Yes    Types: Marijuana    Home Medications Prior to Admission medications   Medication Sig Start Date End Date Taking? Authorizing Provider  acetaminophen (TYLENOL) 500 MG tablet Take 500-1,000 mg by mouth every 6 (six) hours as needed (for pain.).   Yes [provider]  albuterol (PROVENTIL HFA;VENTOLIN HFA) 108 (90 Base) MCG/ACT inhaler Inhale 2 puffs into the lungs every 6 (six) hours as needed for wheezing or shortness of breath. 01/01/18  Yes Bhagat, Bhavinkumar, PA  ALPRAZolam (XANAX) 0.5 MG tablet Take 0.5 mg by mouth 3 (three) times daily as needed for anxiety.   Yes [provider]  amiodarone (PACERONE) 100 MG tablet Take 1 tablet (100 mg total) by mouth daily. 08/15/19  Yes Nahser, Wonda Cheng, MD  apixaban (ELIQUIS) 5 MG TABS tablet Take 1 tablet (5 mg total) by mouth 2 (two) times daily. 11/20/18  Yes Nahser, Wonda Cheng, MD  aspirin 81 MG chewable tablet Chew 1 tablet (81 mg total) by mouth daily. 07/10/19  Yes Dagoberto Ligas, PA-C  atorvastatin (LIPITOR) 80 MG  tablet TAKE ONE TABLET BY MOUTH DAILY AT 6PM Patient taking differently: Take 80 mg by mouth daily at 6 PM. TAKE ONE TABLET BY MOUTH DAILY AT 6PM 11/20/18  Yes Nahser, Wonda Cheng, MD  buPROPion (WELLBUTRIN XL) 150 MG 24 hr tablet Take 150 mg by mouth daily.  Yes [provider]  clopidogrel (PLAVIX) 75 MG tablet Take 75 mg by mouth daily.   Yes [provider]  diclofenac Sodium (VOLTAREN) 1 % GEL Apply 1 g topically 4 (four) times daily as needed for pain. 02/15/19  Yes [provider]  ezetimibe (ZETIA) 10 MG tablet Take 10 mg by mouth daily.   Yes [provider]  furosemide (LASIX) 20 MG tablet Take 1 tablet (20 mg total) by mouth daily. 07/13/19  Yes Ulyses Amor, PA-C  gabapentin (NEURONTIN) 300 MG capsule Take 300 mg by mouth 3 (three) times daily as needed for pain. 02/15/19  Yes [provider]  metoprolol tartrate (LOPRESSOR) 50 MG tablet Take 50 mg by mouth in the morning and at bedtime. 08/14/19  Yes [provider]  nitroGLYCERIN (NITROSTAT) 0.4 MG SL tablet Place 1 tablet (0.4 mg total) under the tongue every 5 (five) minutes as needed for chest pain. 11/20/18  Yes Nahser, Wonda Cheng, MD  oxyCODONE 10 MG TABS Take 1 tablet (10 mg total) by mouth 3 (three) times daily. 07/12/19  Yes Ulyses Amor, PA-C  pantoprazole (PROTONIX) 40 MG tablet Take 1 tablet (40 mg total) by mouth daily. 11/20/18  Yes Nahser, Wonda Cheng, MD  pramipexole (MIRAPEX) 0.25 MG tablet Take 0.25 mg by mouth at bedtime.  02/18/19  Yes [provider]  promethazine (PHENERGAN) 12.5 MG tablet Take 1 tablet (12.5 mg total) by mouth every 6 (six) hours as needed for nausea or vomiting. 08/02/19  Yes Waynetta Sandy, MD  ranolazine (RANEXA) 1000 MG SR tablet Take 1,000 mg by mouth 2 (two) times daily.   Yes [provider]  sertraline (ZOLOFT) 100 MG tablet Take 150 mg by mouth at bedtime.    Yes [provider]  spironolactone (ALDACTONE) 25  MG tablet Take 1 tablet (25 mg total) by mouth daily. 07/13/19  Yes Ulyses Amor, PA-C  valsartan-hydrochlorothiazide (DIOVAN-HCT) 160-12.5 MG tablet Take 1 tablet by mouth daily. 08/14/19  Yes [provider]  losartan (COZAAR) 25 MG tablet Take 1 tablet (25 mg total) by mouth daily. Patient not taking: Reported on 09/10/2019 07/13/19   Ulyses Amor, PA-C    Allergies    Benadryl [diphenhydramine], Penicillins, Adhesive [tape], Amoxicillin, Cyclobenzaprine, and Hydrocodone  Review of Systems   Review of Systems  Constitutional: Positive for fatigue. Negative for appetite change and fever.  HENT: Negative for congestion.   Respiratory: Positive for cough and shortness of breath.   Cardiovascular: Positive for chest pain. Negative for leg swelling.  Gastrointestinal: Negative for abdominal pain.  Genitourinary: Negative for flank pain.  Musculoskeletal: Negative for back pain.  Skin: Negative for rash.  Neurological: Negative for weakness.  Psychiatric/Behavioral: Negative for confusion.    Physical Exam Updated Vital Signs BP (!) 180/72 (BP Location: Right Arm)   Pulse 65   Temp 99 F (37.2 C) (Oral)   Resp 20   SpO2 98%   Physical Exam Vitals and nursing note reviewed.  HENT:     Head: Atraumatic.  Cardiovascular:     Rate and Rhythm: Regular rhythm.  Pulmonary:     Comments: Rales bilateral bases.  No wheezes. Chest:     Chest wall: No tenderness or edema.  Musculoskeletal:     Right lower leg: No edema.     Left lower leg: No edema.  Skin:    General: Skin is warm.  Neurological:     Mental Status: She is alert.  ED Results / Procedures / Treatments   Labs (all labs ordered are listed, but only abnormal results are displayed) Labs Reviewed  BRAIN NATRIURETIC PEPTIDE - Abnormal; Notable for the following components:      Result Value   B Natriuretic Peptide 2,476.7 (*)    All other components within normal limits  CBC WITH DIFFERENTIAL/PLATELET  - Abnormal; Notable for the following components:   WBC 3.6 (*)    RBC 3.12 (*)    Hemoglobin 8.9 (*)    HCT 30.9 (*)    MCHC 28.8 (*)    RDW 15.9 (*)    All other components within normal limits  COMPREHENSIVE METABOLIC PANEL - Abnormal; Notable for the following components:   Potassium 3.3 (*)    Calcium 8.6 (*)    Total Protein 6.3 (*)    Albumin 2.9 (*)    AST 12 (*)    All other components within normal limits  TROPONIN I (HIGH SENSITIVITY) - Abnormal; Notable for the following components:   Troponin I (High Sensitivity) 32 (*)    All other components within normal limits    EKG EKG Interpretation  Date/Time:  Tuesday September 10 2019 12:08:16 EDT Ventricular Rate:  65 PR Interval:    QRS Duration: 98 QT Interval:  536 QTC Calculation: 558 R Axis:   81 Text Interpretation: Sinus rhythm Multiple ventricular premature complexes Borderline right axis deviation Borderline repolarization abnormality Prolonged QT interval Confirmed by Davonna Belling [phone]) on 09/10/2019 12:26:44 PM   Radiology DG Chest Portable 1 View  Result Date: 09/10/2019 CLINICAL DATA:  Shortness of breath and chest pain EXAM: PORTABLE CHEST 1 VIEW COMPARISON:  Jul 21, 2019 FINDINGS: There is ill-defined airspace opacity in the left upper lobe. There is mild bibasilar atelectasis. Heart is upper normal in size with pulmonary vascularity normal. Patient is status post coronary artery bypass grafting. No adenopathy. There is postoperative change in the left humerus. IMPRESSION: Suspected pneumonia left upper lobe. Mild bibasilar atelectasis. Stable cardiac silhouette with postoperative changes. Followup PA and lateral chest radiographs recommended in 3-4 weeks following trial of antibiotic therapy to ensure resolution and exclude underlying malignancy. Electronically Signed   By: Lowella Grip III M.D.   On: 09/10/2019 12:55    Procedures Procedures (including critical care time)  Medications Ordered in  ED Medications  cefTRIAXone (ROCEPHIN) 1 g in sodium chloride 0.9 % 100 mL IVPB (has no administration in time range)  azithromycin (ZITHROMAX) 500 mg in sodium chloride 0.9 % 250 mL IVPB (has no administration in time range)    ED Course  I have reviewed the triage vital signs and the nursing notes.  Pertinent labs & imaging results that were available during my care of the patient were reviewed by me and considered in my medical decision making (see chart for details).    MDM Rules/Calculators/A&P                          Patient presents with shortness of breath.  Had for around the last 4 days.  COPD history.  X-ray showed potential infiltrate.  Has Rales bilateral bases.  BNP is elevated but x-ray does not show pulmonary edema.  Does have an oxygen requirement.  Will require admission to the hospital.  Antibiotics given.  Patient is on anticoagulation so lower risk for pulmonary embolism.  Also has had both of her Covid vaccinations. Final Clinical Impression(s) / ED Diagnoses Final diagnoses:  Community acquired  pneumonia, unspecified laterality  Hypoxia    Rx / DC Orders ED Discharge Orders    None       Davonna Belling, MD 09/10/19 [phone]

## 2019-09-10 NOTE — ED Triage Notes (Signed)
Pt BIB [facility] SOB Since Thursday. Pt was sat 90% Room air. Pt given 2 Litters O2 back up to 97%. Per EMS lung sounds are rhonchi bilaterally.  180/100  90% O2 RA Afebrile .

## 2019-09-11 ENCOUNTER — Encounter (HOSPITAL_COMMUNITY): Payer: Self-pay | Admitting: Internal Medicine

## 2019-09-11 DIAGNOSIS — I10 Essential (primary) hypertension: Secondary | ICD-10-CM

## 2019-09-11 DIAGNOSIS — J189 Pneumonia, unspecified organism: Secondary | ICD-10-CM

## 2019-09-11 DIAGNOSIS — I5023 Acute on chronic systolic (congestive) heart failure: Secondary | ICD-10-CM

## 2019-09-11 LAB — CBC WITH DIFFERENTIAL/PLATELET
Abs Immature Granulocytes: 0.02 10*3/uL (ref 0.00–0.07)
Basophils Absolute: 0 10*3/uL (ref 0.0–0.1)
Basophils Relative: 1 %
Eosinophils Absolute: 0.1 10*3/uL (ref 0.0–0.5)
Eosinophils Relative: 3 %
HCT: 27.4 % — ABNORMAL LOW (ref 36.0–46.0)
Hemoglobin: 8.1 g/dL — ABNORMAL LOW (ref 12.0–15.0)
Immature Granulocytes: 1 %
Lymphocytes Relative: 22 %
Lymphs Abs: 0.8 10*3/uL (ref 0.7–4.0)
MCH: 28.5 pg (ref 26.0–34.0)
MCHC: 29.6 g/dL — ABNORMAL LOW (ref 30.0–36.0)
MCV: 96.5 fL (ref 80.0–100.0)
Monocytes Absolute: 0.3 10*3/uL (ref 0.1–1.0)
Monocytes Relative: 7 %
Neutro Abs: 2.3 10*3/uL (ref 1.7–7.7)
Neutrophils Relative %: 66 %
Platelets: 189 10*3/uL (ref 150–400)
RBC: 2.84 MIL/uL — ABNORMAL LOW (ref 3.87–5.11)
RDW: 15.8 % — ABNORMAL HIGH (ref 11.5–15.5)
WBC: 3.5 10*3/uL — ABNORMAL LOW (ref 4.0–10.5)
nRBC: 0 % (ref 0.0–0.2)

## 2019-09-11 LAB — BASIC METABOLIC PANEL
Anion gap: 7 (ref 5–15)
BUN: 7 mg/dL — ABNORMAL LOW (ref 8–23)
CO2: 28 mmol/L (ref 22–32)
Calcium: 8.3 mg/dL — ABNORMAL LOW (ref 8.9–10.3)
Chloride: 105 mmol/L (ref 98–111)
Creatinine, Ser: 0.69 mg/dL (ref 0.44–1.00)
GFR calc Af Amer: >60 mL/min (ref >60)
GFR calc non Af Amer: >60 mL/min (ref >60)
Glucose, Bld: 86 mg/dL (ref 70–99)
Potassium: 3.6 mmol/L (ref 3.5–5.1)
Sodium: 140 mmol/L (ref 135–145)

## 2019-09-11 LAB — MAGNESIUM: Magnesium: 1.6 mg/dL — ABNORMAL LOW (ref 1.7–2.4)

## 2019-09-11 MED ORDER — MAGNESIUM SULFATE 2 GM/50ML IV SOLN
2.0000 g | Freq: Once | INTRAVENOUS | Status: AC
  Administered 2019-09-11: 2 g via INTRAVENOUS
  Filled 2019-09-11: qty 50

## 2019-09-11 MED ORDER — POTASSIUM CHLORIDE CRYS ER 20 MEQ PO TBCR
40.0000 meq | EXTENDED_RELEASE_TABLET | Freq: Once | ORAL | Status: AC
  Administered 2019-09-11: 40 meq via ORAL
  Filled 2019-09-11: qty 2

## 2019-09-11 NOTE — ED Notes (Signed)
Attempted report to [facility] x 1

## 2019-09-11 NOTE — ED Notes (Signed)
Breakfast ordered 

## 2019-09-11 NOTE — Plan of Care (Signed)
  Problem: Clinical Measurements: Goal: Diagnostic test results will improve Outcome: Progressing   Problem: Nutrition: Goal: Adequate nutrition will be maintained Outcome: Progressing   Problem: Coping: Goal: Level of anxiety will decrease Outcome: Progressing   

## 2019-09-11 NOTE — Progress Notes (Addendum)
[facility]  PROGRESS NOTE  Wendy [name]:[id number] DOB: [birth year] DOA: 09/10/2019 PCP: Josetta Huddle, MD   Brief HPI:   69 year old female with a history of hypertension, hyperlipidemia, PAF, CAD s/p CABG, PVD, COPD came with progressively worsening shortness of breath for past 4 days.  Patient had cough which was nonproductive.  Patient received 2 shots of COVID-19 vaccine.  She also has been compliant with taking her Eliquis at home.  In the ED her BNP was elevated to 2467.  Chest x-ray showed left upper lobe opacity.  She was started on empiric antibiotic Rocephin and azithromycin for community-acquired pneumonia.    Subjective   Patient seen and examined, diuresed well with Lasix given yesterday.  Breathing has improved.   Assessment/Plan:     1. Acute on chronic combined systolic and diastolic CHF-patient presented with worsening shortness of breath, O2 sats dropped to 84% with improvement on 2 L/min of oxygen.  BNP elevated to 2476.  Last EF 20-25% with left ventricular global hypokinesis in April 2021.  Started on Lasix 40 mg IV twice daily.  Patient is clinically improving with good diuretic response to Lasix. 2. ?  Community-acquired pneumonia-chest x-ray concerning for left upper lobe pneumonia.  Azithromycin discontinued due to prolonged QT interval.  Continue Rocephin. 3. Prolonged QTc interval-QTc interval noted to be elevated 559, magnesium was low and was replaced yesterday.  Will recheck magnesium today.  EKG done this morning and shows QTC  is 536. 4. Paroxysmal atrial fibrillation-patient is sinus rhythm.  Continue Xarelto for anticoagulation.  Continue metoprolol 50 mg p.o. twice daily. 5. Peripheral arterial disease-patient has history of aortoiliac occlusive disease s/p aortobifemoral bypass with reimplantation of right profunda on aortofemoral bifemoral limb by Dr. Donzetta Matters on 06/18/2019.  Continue statin and aspirin. 6. Hypertension-blood pressure stable,  continue metoprolol, valsartan/HCTZ 7. Anxiety-continue Wellbutrin and Xanax.  Zoloft on hold due to prolonged QTC.    SpO2: 97 % O2 Flow Rate (L/min): 2 L/min     Lab Results  Component Value Date   SARSCOV2NAA NEGATIVE 09/10/2019   SARSCOV2NAA NEGATIVE 07/21/2019   SARSCOV2NAA NEGATIVE 07/10/2019   New Site NEGATIVE 06/14/2019      CBC: Recent Labs  Lab 09/10/19 1305 09/11/19 0452  WBC 3.6* 3.5*  NEUTROABS 2.7 2.3  HGB 8.9* 8.1*  HCT 30.9* 27.4*  MCV 99.0 96.5  PLT 176 656    Basic Metabolic Panel: Recent Labs  Lab 09/10/19 1305 09/10/19 1646 09/11/19 0452  NA 139  --  140  K 3.3*  --  3.6  CL 106  --  105  CO2 22  --  28  GLUCOSE 98  --  86  BUN 12  --  7*  CREATININE 0.66  --  0.69  CALCIUM 8.6*  --  8.3*  MG  --  1.6*  --      Liver Function Tests: Recent Labs  Lab 09/10/19 1305  AST 12*  ALT 11  ALKPHOS 72  BILITOT 0.8  PROT 6.3*  ALBUMIN 2.9*     DVT prophylaxis: Eliquis  Code Status: Full code  Family Communication: No family at bedside    Status is: Inpatient  Dispo: The patient is from: Home              Anticipated d/c is to: Home              Anticipated d/c date is: 09/12/2019  Patient currently not medically stable for discharge  Barrier to discharge-ongoing treatment for pneumonia and diastolic heart failure.        Scheduled medications:  . amiodarone  100 mg Oral Daily  . apixaban  5 mg Oral BID  . aspirin  81 mg Oral Daily  . atorvastatin  80 mg Oral q1800  . buPROPion  150 mg Oral Daily  . clopidogrel  75 mg Oral Daily  . ezetimibe  10 mg Oral Daily  . ferrous sulfate  325 mg Oral BID WC  . furosemide  40 mg Intravenous BID  . guaiFENesin  600 mg Oral BID  . irbesartan  150 mg Oral Daily  . metoprolol tartrate  50 mg Oral BID  . oxyCODONE  10 mg Oral TID  . pramipexole  0.25 mg Oral QHS  . sodium chloride flush  3 mL Intravenous Q12H    Consultants:    Procedures:     Antibiotics:   Anti-infectives (From admission, onward)   Start     Dose/Rate Route Frequency Ordered Stop   09/11/19 1200  cefTRIAXone (ROCEPHIN) 1 g in sodium chloride 0.9 % 100 mL IVPB     Discontinue     1 g 200 mL/hr over 30 Minutes Intravenous Every 24 hours 09/10/19 1836     09/10/19 1315  cefTRIAXone (ROCEPHIN) 1 g in sodium chloride 0.9 % 100 mL IVPB        1 g 200 mL/hr over 30 Minutes Intravenous  Once 09/10/19 1304 09/10/19 1556   09/10/19 1315  azithromycin (ZITHROMAX) 500 mg in sodium chloride 0.9 % 250 mL IVPB        500 mg 250 mL/hr over 60 Minutes Intravenous  Once 09/10/19 1304 09/10/19 1557       Objective   Vitals:   09/11/19 0700 09/11/19 0913 09/11/19 1021 09/11/19 1203  BP: (!) 142/62 (!) 145/55 (!) 135/52 140/62  Pulse: (!) 53 (!) 55 60 (!) 59  Resp: (!) 28 18  20   Temp:  98 F (36.7 C)  98.6 F (37 C)  TempSrc:  Oral  Oral  SpO2: 99% 96%  97%    Intake/Output Summary (Last 24 hours) at 09/11/2019 1333 Last data filed at 09/11/2019 1304 Gross per 24 hour  Intake 3 ml  Output 1400 ml  Net -1397 ml    07/05 1901 - 07/07 0700 In: 3 [I.V.:3] Out: -   There were no vitals filed for this visit.  Physical Examination:    General: Appears in no acute distress  Cardiovascular: S1-S2, regular, no murmur auscultated  Respiratory: Clear to auscultation bilaterally  Abdomen: Abdomen is soft, nontender, no organomegaly  Extremities: No edema in the lower extremities  Neurologic: Alert, oriented x3, intact insight and judgment, no focal deficit noted    Data Reviewed:   Recent Results (from the past 240 hour(s))  SARS Coronavirus 2 by RT PCR (hospital order, performed in [place] hospital lab) Nasopharyngeal Nasopharyngeal Swab     Status: None   Collection Time: 09/10/19  7:05 PM   Specimen: Nasopharyngeal Swab  Result Value Ref Range Status   SARS Coronavirus 2 NEGATIVE NEGATIVE Final    Comment: (NOTE) SARS-CoV-2 target nucleic acids  are NOT DETECTED.  The SARS-CoV-2 RNA is generally detectable in upper and lower respiratory specimens during the acute phase of infection. The lowest concentration of SARS-CoV-2 viral copies this assay can detect is 250 copies / mL. A negative result does not preclude SARS-CoV-2 infection  and should not be used as the sole basis for treatment or other patient management decisions.  A negative result may occur with improper specimen collection / handling, submission of specimen other than nasopharyngeal swab, presence of viral mutation(s) within the areas targeted by this assay, and inadequate number of viral copies (<250 copies / mL). A negative result must be combined with clinical observations, patient history, and epidemiological information.  Fact Sheet for Patients:   StrictlyIdeas.no  Fact Sheet for Healthcare Providers: BankingDealers.co.za  This test is not yet approved or  cleared by the [place] FDA and has been authorized for detection and/or diagnosis of SARS-CoV-2 by FDA under an Emergency Use Authorization (EUA).  This EUA will remain in effect (meaning this test can be used) for the duration of the COVID-19 declaration under Section 564(b)(1) of the Act, 21 U.S.C. section 360bbb-3(b)(1), unless the authorization is terminated or revoked sooner.  Performed at [facility], [place] [address] [place], [place] [address]       Recent Labs    06/19/19 1239 09/10/19 1305  BNP 366.7* 2,476.7*     Studies:  DG Chest Portable 1 View  Result Date: 09/10/2019 CLINICAL DATA:  Shortness of breath and chest pain EXAM: PORTABLE CHEST 1 VIEW COMPARISON:  Jul 21, 2019 FINDINGS: There is ill-defined airspace opacity in the left upper lobe. There is mild bibasilar atelectasis. Heart is upper normal in size with pulmonary vascularity normal. Patient is status post coronary artery bypass grafting. No adenopathy. There is  postoperative change in the left humerus. IMPRESSION: Suspected pneumonia left upper lobe. Mild bibasilar atelectasis. Stable cardiac silhouette with postoperative changes. Followup PA and lateral chest radiographs recommended in 3-4 weeks following trial of antibiotic therapy to ensure resolution and exclude underlying malignancy. Electronically Signed   By: Lowella Grip III M.D.   On: 09/10/2019 12:55       [place]   [facility] If 7PM-7AM, please contact night-coverage at [email or web address], Office  [phone]   09/11/2019, 1:33 PM  LOS: 1 day

## 2019-09-11 NOTE — Consult Note (Addendum)
Cardiology Consultation:   Patient ID: Wendy [name]; [id number]; 01-25-1951   Admit date: 09/10/2019 Date of Consult: 09/11/2019  Primary Care Provider: Josetta Huddle, MD Primary Cardiologist: Dr. [name], MD/Dr. Aundra Dubin, MD  Patient Profile:   Wendy [name] is a 69 y.o. female with a hx of hx of PAD, CAD status post NSTEMI s/p CABG x3 (SVG-OM, LIMA-LAD, SVG-RCA in 1998) with PCI on 2013 and 2015, PAF,  hypertension, chronic combined systolic and diastolic heart failure, COPD, GERD, peripheral neuropathy, and polysubstance abuse who is being seen today for the evaluation of CHF at the request of Dr. Tamala Julian.  History of Present Illness:   Wendy [name] is a 69 year old female with a history as stated above who presented to [facility] 09/10/2019 with worsening shortness of breath and orthopnea symptoms over the last several days.  On admission, BP was moderately elevated at 180/72.  Unfortunately, O2 saturations were in the mid 80s on room air with improvement on supplemental oxygen.  BNP found to be markedly elevated at 2476 with a hsT at 32.  CXR with left upper lobe opacity therefore patient started on empiric antibiotics with Rocephin and azithromycin for suspected community-acquired pneumonia.  On my exam, she denies chest pain, LE edema, weight changes, nausea, vomiting, dizziness or syncope.  She has been compliant with her anticoagulation and other medications.  Wendy [name] has a complicated past medical history. CAD hx includes prior LHC in 01/2015 which showed previously known atretic LIMA to LAD, new or recent occlusion of vein graft to LCx, SVG to RCA with ISS of prior stents. DES was placed in SVG to RCA.  Repeat LHC 07/2016 showed occluded SVG to marginal, known from prior. LIMA to LAD known to be atretic from prior cath. SVG to PDA occluded>>new finding to be an acute occlusion with plans for continued medical therapy for heart failure. EF 25-35% by visual estimate however subsequent  echocardiogram 07/23/16- EF 60-65%.   She then underwent Lexiscan stress test 05/22/2019 for preoperative clearance at which time there was a profound amount of gut uptake that rendered the study uninterpretable therefore LHC was pursued 31 2021 which showed stable three-vessel CAD s/p three-vessel CABG with known occlusion of all 3 grafts, moderate calcified mid LAD stenosis that was angiographically improved from [facility] in 2018, chronic occlusion of the proximal RCA and chronic occlusion of the OM and distal LCx with recommendations for continued medical management of CAD with clearance for planned LE bypass surgery.  Unfortunately, she had a prolonged complicated hospital course in the setting of aortobifemoral bypass 06/18/2019 per Dr. Donzetta Matters complicated by chest pain and atrial fibrillation with RVR with periods of hypotension requiring pressor support.  Cardiology was consulted as well as nephrology for postoperative AKI.  On 06/24/2019 she experienced PEA arrest with stat echocardiogram demonstrating severe global hypokinesis at which time she was taken emergently to the operating room for Impella placement per Dr. Julien Girt.  She was ultimately extubated 06/25/2019 and eventually underwent a repeat cardiac cath 06/27/2019 via left brachial artery approach which showed stable coronary anatomy compared to preoperative thus medical therapy and supportive measures were recommended.  Unfortunately she had a pseudoaneurysm and underwent surgical repair per Dr. Trula Slade 06/30/2019 with a postoperative hemoglobin drop to 6.3 at which time she was transfused 2 units of PRBCs.  After stabilization, she began to work with PT OT and was recommended for CIR however was denied from her insurance company.  She was therefore discharged to [facility] skilled  nursing facility.  At hospital discharge, she was transitioned to Eliquis therapy, and was continued on amiodarone 100 mg, ASA 81, high intensity atorvastatin, Plavix, Lasix 20,  Imdur 90, Ranexa 1000, Lopressor 50 and hydrochlorothiazide 160-12.5  Past Medical History:  Diagnosis Date  . Anemia   . Anginal pain ([place])    jaw pain 07/2016, s/p 07/15/16 nuclear stress test  . Anxiety   . Arthritis    "lower back" (08/24/2017)  . Asthma   . [place] Obstructive Pulmonary Disease   . Chronic back pain   . Chronic combined systolic and diastolic CHF    EF 3/54: 65-68 // EF 4/21: 55-60 // EF 4/21: 20-25  . Chronic leg pain   . Chronic lower back pain   . Coronary Artery Disease    a. s/p NSTEMI >> CABG 1998 // s/p PCI 2013 // [place] 01/2015: DES to SVG-RCA.// Cath 05/2019: all grafts occluded; mod non-obs LAD disease w L-L and L-R collats - med Rx // post Ao-Bifem bypass MI c/b PEA, CGS requiring Impella >> anatomy unchanged - Med Rx  . Depression   . Fall 08/2016  . Gastroesophageal reflux disease   . Glucose intolerance   . Headache    "a few/month" (08/24/2017)  . HLD (hyperlipidemia)   . Hx of DVT X 3   RLE  . Hypertension   . Myocardial infarction ([place]) 1999  . OSA on CPAP   . Paroxysmal atrial fibrillation    a. 09/2013 post-op b. 01/2015 // Apixaban // recurrent AFib post MI after Ao-Bifem in 06/2019 >> Amio Rx  . Peripheral artery disease    s/p L fem-fem bypass // s/p Ao-Bifem bypass in 06/2019  . Peripheral neuropathy   . Pneumonia   . Shortness of breath    when walking  . Wears glasses     Past Surgical History:  Procedure Laterality Date  . ABDOMINAL AORTOGRAM W/LOWER EXTREMITY Bilateral 04/29/2019   Procedure: ABDOMINAL AORTOGRAM W/LOWER EXTREMITY;  Surgeon: Waynetta Sandy, MD;  Location: [place] CV [facility];  Service: Cardiovascular;  Laterality: Bilateral;  . ANTERIOR CERVICAL DECOMP/DISCECTOMY FUSION  1991  . AORTA - BILATERAL FEMORAL ARTERY BYPASS GRAFT N/A 06/18/2019   Procedure: AORTA BIFEMORAL BYPASS GRAFT;  Surgeon: Waynetta Sandy, MD;  Location: [place];  Service: Vascular;  Laterality: N/A;  . ARTERY REPAIR Left  06/30/2019   Procedure: LEFT BRACHIAL ARTERY REPAIR, EVACUATION OF HEMATOMA;  Surgeon: Serafina Mitchell, MD;  Location: [place];  Service: Vascular;  Laterality: Left;  . BACK SURGERY    . CARDIAC CATHETERIZATION N/A 01/22/2015   Procedure: Left Heart Cath and Coronary Angiography;  Surgeon: Troy Sine, MD;  Location: [place] [facility];  Service: Cardiovascular;  Laterality: N/A;  . CARDIAC CATHETERIZATION N/A 01/22/2015   Procedure: Coronary Stent Intervention;  Surgeon: Troy Sine, MD;  Location: [place] [facility];  Service: Cardiovascular;  Laterality: N/A;  3.0x12 xience prox SVG to RCA  . CARDIAC CATHETERIZATION  03/09/2000   hx/notes 03/20/2000  . CHOLECYSTECTOMY N/A 09/16/2013   Procedure: LAPAROSCOPIC CHOLECYSTECTOMY CONVERTED TO OPEN CHOLECYSTECTOMY WITH CHOLANGIOGRAM;  Surgeon: [name], MD;  Location: [place];  Service: General;  Laterality: N/A;  . CORONARY ANGIOGRAPHY N/A 06/27/2019   Procedure: CORONARY ANGIOGRAPHY (CATH LAB);  Surgeon: Sherren Mocha, MD;  Location: [place] [facility];  Service: Cardiovascular;  Laterality: N/A;  . CORONARY ANGIOPLASTY WITH STENT PLACEMENT  11/2011, 02/2012, 01/2015   DES to SVG-RCA both times, In [facility],  [place], Dr. Bruce Donath  . CORONARY ARTERY BYPASS GRAFT  1998   LIMA-LAD, SVG-OM, SVG-RCA  . ERCP N/A 09/19/2013   Procedure: ENDOSCOPIC RETROGRADE CHOLANGIOPANCREATOGRAPHY (ERCP);  Surgeon: [name], MD;  Location: [place];  Service: Endoscopy;  Laterality: N/A;  . FINGER SURGERY Right    ring finger "fused"  . FRACTURE SURGERY    . IRRIGATION AND DEBRIDEMENT ABSCESS Right 10/17/2013   Procedure: INCISION AND DRAINAGE RIGHT SUBCOSTAL WOUND;  Surgeon: [name], MD;  Location: [facility];  Service: General;  Laterality: Right;  . JOINT REPLACEMENT    . KNEE ARTHROSCOPY Right 1998  . LEFT HEART CATH AND CORS/GRAFTS ANGIOGRAPHY N/A 07/25/2016   Procedure: Left Heart Cath and Cors/Grafts Angiography;  Surgeon: Jettie Booze, MD;  Location: [place] [facility];  Service: Cardiovascular;  Laterality: N/A;  . LEFT HEART CATH AND CORS/GRAFTS ANGIOGRAPHY N/A 06/05/2019   Procedure: LEFT HEART CATH AND CORS/GRAFTS ANGIOGRAPHY;  Surgeon: Burnell Blanks, MD;  Location: [place] [facility];  Service: Cardiovascular;  Laterality: N/A;  . LEFT HEART CATHETERIZATION WITH CORONARY/GRAFT ANGIOGRAM N/A 07/19/2013   Procedure: LEFT HEART CATHETERIZATION WITH Beatrix Fetters;  Surgeon: Leonie Man, MD;  Location: [facility] CATH LAB;  Service: Cardiovascular;  Laterality: N/A;  . MASS EXCISION Right 06/25/2014   Procedure: EXCISION BUTTOCK MASS ;  Surgeon: Coralie Keens, MD;  Location: [place];  Service: General;  Laterality: Right;  . ORIF HUMERUS FRACTURE Left 08/27/2013   Procedure: OPEN REDUCTION INTERNAL FIXATION (ORIF) LEFT HUMERUS ;  Surgeon: Rozanna Box, MD;  Location: [place];  Service: Orthopedics;  Laterality: Left;  . PLACEMENT OF IMPELLA LEFT VENTRICULAR ASSIST DEVICE N/A 06/24/2019   Procedure: PLACEMENT OF IMPELLA LEFT VENTRICULAR ASSIST DEVICE;  Surgeon: Wonda Olds, MD;  Location: [place];  Service: Open Heart Surgery;  Laterality: N/A;  . [place]  . REMOVAL OF IMPELLA LEFT VENTRICULAR ASSIST DEVICE Right 06/28/2019   Procedure: REMOVAL OF IMPELLA LEFT VENTRICULAR ASSIST DEVICE;  Surgeon: Wonda Olds, MD;  Location: [place];  Service: Open Heart Surgery;  Laterality: Right;  . THROMBECTOMY / EMBOLECTOMY FEMORAL ARTERY Right    hx/notes 03/20/2000  . TOTAL HIP ARTHROPLASTY Right 07/19/2016   Procedure: TOTAL HIP ARTHROPLASTY ANTERIOR APPROACH;  Surgeon: [name], MD;  Location: [place];  Service: Orthopedics;  Laterality: Right;  . TOTAL HIP REVISION Right 08/22/2017   Procedure: TOTAL HIP REVISION;  Surgeon: [name], MD;  Location: [place];  Service: Orthopedics;  Laterality: Right;     Prior to Admission medications    Medication Sig Start Date End Date Taking? Authorizing Provider  acetaminophen (TYLENOL) 500 MG tablet Take 500-1,000 mg by mouth every 6 (six) hours as needed (for pain.).   Yes [provider]  albuterol (PROVENTIL HFA;VENTOLIN HFA) 108 (90 Base) MCG/ACT inhaler Inhale 2 puffs into the lungs every 6 (six) hours as needed for wheezing or shortness of breath. 01/01/18  Yes Bhagat, Bhavinkumar, PA  ALPRAZolam (XANAX) 0.5 MG tablet Take 0.5 mg by mouth 3 (three) times daily as needed for anxiety.   Yes [provider]  amiodarone (PACERONE) 100 MG tablet Take 1 tablet (100 mg total) by mouth daily. 08/15/19  Yes Nahser, Wonda Cheng, MD  apixaban (ELIQUIS) 5 MG TABS tablet Take 1 tablet (5 mg total) by mouth 2 (two) times daily. 11/20/18  Yes Nahser, Wonda Cheng, MD  aspirin 81 MG chewable tablet Chew 1  tablet (81 mg total) by mouth daily. 07/10/19  Yes Dagoberto Ligas, PA-C  atorvastatin (LIPITOR) 80 MG tablet TAKE ONE TABLET BY MOUTH DAILY AT 6PM Patient taking differently: Take 80 mg by mouth daily at 6 PM. TAKE ONE TABLET BY MOUTH DAILY AT 6PM 11/20/18  Yes Nahser, Wonda Cheng, MD  buPROPion (WELLBUTRIN XL) 150 MG 24 hr tablet Take 150 mg by mouth daily.   Yes [provider]  clopidogrel (PLAVIX) 75 MG tablet Take 75 mg by mouth daily.   Yes [provider]  diclofenac Sodium (VOLTAREN) 1 % GEL Apply 1 g topically 4 (four) times daily as needed for pain. 02/15/19  Yes [provider]  ezetimibe (ZETIA) 10 MG tablet Take 10 mg by mouth daily.   Yes [provider]  furosemide (LASIX) 20 MG tablet Take 1 tablet (20 mg total) by mouth daily. 07/13/19  Yes Ulyses Amor, PA-C  gabapentin (NEURONTIN) 300 MG capsule Take 300 mg by mouth 3 (three) times daily as needed for pain. 02/15/19  Yes [provider]  metoprolol tartrate (LOPRESSOR) 50 MG tablet Take 50 mg by mouth in the morning and at bedtime. 08/14/19  Yes [provider]  nitroGLYCERIN  (NITROSTAT) 0.4 MG SL tablet Place 1 tablet (0.4 mg total) under the tongue every 5 (five) minutes as needed for chest pain. 11/20/18  Yes Nahser, Wonda Cheng, MD  oxyCODONE 10 MG TABS Take 1 tablet (10 mg total) by mouth 3 (three) times daily. 07/12/19  Yes Ulyses Amor, PA-C  pantoprazole (PROTONIX) 40 MG tablet Take 1 tablet (40 mg total) by mouth daily. 11/20/18  Yes Nahser, Wonda Cheng, MD  pramipexole (MIRAPEX) 0.25 MG tablet Take 0.25 mg by mouth at bedtime.  02/18/19  Yes [provider]  promethazine (PHENERGAN) 12.5 MG tablet Take 1 tablet (12.5 mg total) by mouth every 6 (six) hours as needed for nausea or vomiting. 08/02/19  Yes Waynetta Sandy, MD  ranolazine (RANEXA) 1000 MG SR tablet Take 1,000 mg by mouth 2 (two) times daily.   Yes [provider]  sertraline (ZOLOFT) 100 MG tablet Take 150 mg by mouth at bedtime.    Yes [provider]  spironolactone (ALDACTONE) 25 MG tablet Take 1 tablet (25 mg total) by mouth daily. 07/13/19  Yes Ulyses Amor, PA-C  valsartan-hydrochlorothiazide (DIOVAN-HCT) 160-12.5 MG tablet Take 1 tablet by mouth daily. 08/14/19  Yes [provider]  losartan (COZAAR) 25 MG tablet Take 1 tablet (25 mg total) by mouth daily. Patient not taking: Reported on 09/10/2019 07/13/19   Ulyses Amor, PA-C    Inpatient Medications: Scheduled Meds: . amiodarone  100 mg Oral Daily  . apixaban  5 mg Oral BID  . aspirin  81 mg Oral Daily  . atorvastatin  80 mg Oral q1800  . buPROPion  150 mg Oral Daily  . clopidogrel  75 mg Oral Daily  . ezetimibe  10 mg Oral Daily  . ferrous sulfate  325 mg Oral BID WC  . furosemide  40 mg Intravenous BID  . guaiFENesin  600 mg Oral BID  . irbesartan  150 mg Oral Daily  . oxyCODONE  10 mg Oral TID  . pramipexole  0.25 mg Oral QHS  . sodium chloride flush  3 mL Intravenous Q12H   Continuous Infusions: . sodium chloride 250 mL (09/11/19 1426)  . cefTRIAXone (ROCEPHIN)  IV 1 g (09/11/19 1427)    PRN Meds: sodium chloride, acetaminophen, ALPRAZolam, diclofenac  Sodium, ipratropium-albuterol, sodium chloride flush  Allergies:    Allergies  Allergen Reactions  . Benadryl [Diphenhydramine] Shortness Of Breath  . Penicillins Diarrhea    Did it involve swelling of the face/tongue/throat, SOB, or low BP? No Did it involve sudden or severe rash/hives, skin peeling, or any reaction on the inside of your mouth or nose? No Did you need to seek medical attention at a hospital or doctor's office? No When did it last happen?2 months If all above answers are "NO", may proceed with cephalosporin use.  . Adhesive [Tape] Other (See Comments)    Burning of skin  . Amoxicillin Diarrhea and Other (See Comments)    Has patient had a PCN reaction causing immediate rash, facial/tongue/throat swelling, SOB or lightheadedness with hypotension: No Has patient had a PCN reaction causing severe rash involving mucus membranes or skin necrosis: No Has patient had a PCN reaction that required hospitalization No Has patient had a PCN reaction occurring within the last 10 years: Yes -- noted rxn as diarrhea If all of the above answers are "NO", then may proceed with Cephalosporin use.   . Cyclobenzaprine Other (See Comments)    Causes restless leg syndrome Restless syndrome  . Hydrocodone Itching    Social History:   Social History   Socioeconomic History  . Marital status: Divorced    Spouse name: Not on file  . Number of children: 5  . Years of education: Not on file  . Highest education level: Not on file  Occupational History  . Occupation: Disabled  Tobacco Use  . Smoking status: Former Smoker    Packs/day: 0.50    Years: 34.00    Pack years: 17.00    Types: Cigarettes    Quit date: 07/20/1998    Years since quitting: 21.1  . Smokeless tobacco: Never Used  Vaping Use  . Vaping Use: Never used  Substance and Sexual Activity  . Alcohol use: Not Currently  . Drug use: Yes     Types: Marijuana  . Sexual activity: Not Currently  Other Topics Concern  . Not on file  Social History Narrative   Lives with best friend.    Social Determinants of Health   Financial Resource Strain:   . Difficulty of Paying Living Expenses:   Food Insecurity:   . Worried About Charity fundraiser in the Last Year:   . Arboriculturist in the Last Year:   Transportation Needs:   . Film/video editor (Medical):   Marland Kitchen Lack of Transportation (Non-Medical):   Physical Activity:   . Days of Exercise per Week:   . Minutes of Exercise per Session:   Stress:   . Feeling of Stress :   Social Connections:   . Frequency of Communication with Friends and Family:   . Frequency of Social Gatherings with Friends and Family:   . Attends Religious Services:   . Active Member of Clubs or Organizations:   . Attends Archivist Meetings:   Marland Kitchen Marital Status:   Intimate Partner Violence:   . Fear of Current or Ex-Partner:   . Emotionally Abused:   Marland Kitchen Physically Abused:   . Sexually Abused:     Family History:   Family History  Problem Relation Age of Onset  . Lung cancer Mother   . Clotting disorder Father   . Lung cancer Sister   . Heart disease Brother   . Heart disease Brother   . Colon cancer Neg Hx   .  Breast cancer Neg Hx    Family Status:  Family Status  Relation Name Status  . Mother  Deceased at age 42       Died of cancer, hx CAD  . Father  Deceased at age 45       CVA or aneurysm  . Sister  (Not Specified)  . Brother  (Not Specified)  . Brother  (Not Specified)  . MGM  Deceased  . MGF  Deceased  . PGM  Deceased  . PGF  Deceased  . Neg Hx  (Not Specified)    ROS:  Please see the history of present illness.  All other ROS reviewed and negative.     Physical Exam/Data:   Vitals:   09/11/19 0700 09/11/19 0913 09/11/19 1021 09/11/19 1203  BP: (!) 142/62 (!) 145/55 (!) 135/52 140/62  Pulse: (!) 53 (!) 55 60 (!) 59  Resp: (!) 28 18  20   Temp:  98 F  (36.7 C)  98.6 F (37 C)  TempSrc:  Oral  Oral  SpO2: 99% 96%  97%    Intake/Output Summary (Last 24 hours) at 09/11/2019 1638 Last data filed at 09/11/2019 1304 Gross per 24 hour  Intake 3 ml  Output 1400 ml  Net -1397 ml   There were no vitals filed for this visit. There is no height or weight on file to calculate BMI.   General: Well developed, well nourished, NAD Neck: Negative for carotid bruits. No JVD Lungs: Bilateral lower lobe crackles. No wheezes. Breathing is unlabored. Cardiovascular: RRR with S1 S2. No murmur Abdomen: Soft, non-tender, non-distended. No obvious abdominal masses. Extremities: No edema.  Radial pulses 2+ bilaterally Neuro: Alert and oriented. No focal deficits. No facial asymmetry. MAE spontaneously. Psych: Responds to questions appropriately with normal affect.    EKG:  The EKG was personally reviewed and demonstrates: 09/10/2019 NSR, HR 65 bpm with PVCs and nonspecific T wave abnormalities Telemetry:  Telemetry was personally reviewed and demonstrates: Poor telemetry review however appears to be in sinus bradycardia with a rate at 55 bpm  Relevant CV Studies:  LHC 06/05/2019: (Preop)   Prox RCA to Mid RCA lesion is 100% stenosed.  Dist Cx lesion is 80% stenosed.  3rd Mrg lesion is 100% stenosed.  1st Mrg-1 lesion is 100% stenosed.  1st Mrg-2 lesion is 100% stenosed.  SVG graft was not visualized.  Origin to Prox Graft lesion is 100% stenosed.  SVG graft was not visualized.  Origin to Prox Graft lesion is 100% stenosed.  Prox LAD to Mid LAD lesion is 60% stenosed.  1st Diag lesion is 100% stenosed.  LIMA graft was not visualized.  Origin to Dist Graft lesion is 100% stenosed.   1. Stable three vessel CAD s/p 3V CABG with known occlusion of all three grafts.  2. Moderate, calcified mid LAD stenosis that is angiographically improved from cath in 2018.  3. Chronic occlusion proximal RCA. The vessel fills from left to right collaterals.   4. Chronic occlusion obtuse marginal and distal Circumflex.   Recommendations: Continue medical management of CAD. Proceed with planning for LE bypass surgery.   Coronary angiography 06/27/2019:  1. Severe multivessel CAD with total occlusion of the RCA, first diagonal, and first OM branches 2. 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 Moderate calcific proximal LAD stenosis unchanged from prior cath study 4. Distal RCA and LCx territories collateralized by branches of the LAD  Stable coronary anatomy - continue medical therapy/supportive measures  Echocardiogram: 07/2016 Study Conclusions   - Left ventricle: The cavity size was normal. Wall thickness was  normal. Systolic function was normal. The estimated ejection  fraction was in the range of 60% to 65%. Wall motion was normal;  there were no regional wall motion abnormalities. The study is  not technically sufficient to allow evaluation of LV diastolic  function.  - Aortic valve: Mildly calcified annulus. Trileaflet.  - Mitral valve: Calcified annulus. There was trivial regurgitation.  - Right atrium: Central venous pressure (est): 3 mm Hg.  - Tricuspid valve: There was trivial regurgitation.  - Pulmonary arteries: PA peak pressure: 24 mm Hg (S).  - Pericardium, extracardiac: There was no pericardial effusion.   Impressions:   - Normal LV wall thickness with LVEF 60-65%. Indeterminate  diastolic function. Mitral annular calcification with trivial  mitral regurgitation. Trivial tricuspid regurgitation with PASP  24 mmHg.   Left Heart Cath and Cors/Grafts Angiography 07/2016  Conclusion    Severe three-vessel coronary artery disease.  Unable to gain access from the left common femoral. It appears there is a left to right fem-fem graft based on the wire course after needle access to the vessel.  Occluded SVG to marginal, known from prior. LIMA to LAD known to be atretic from prior  cath.  SVG to PDA occluded. This is a new finding.  Mid LAD lesion, 50 %stenosed. This is an ulcerated, irregular lesion. There are left to right collaterals.  The left ventricular ejection fraction is 25-35% by visual estimate.  There is moderate to severe left ventricular systolic dysfunction.  LV end diastolic pressure is moderately elevated.  There is no aortic valve stenosis.  Of note, poor radial pulses bilaterally. Right brachial artery was accessed using ultrasound guidance.  I suspect her troponin elevation is from heart failure. It does not have a typical ACS pattern. I don't think her SVG to RCA occlusion is acute.  Continue medical therapy for heart failure.  Consider viability study. If inferior wall is viable, could consider cardiac surgery consultation for repeat CABG.   Diagnostic Dominance: Right     Laboratory Data:  Chemistry Recent Labs  Lab 09/10/19 1305 09/11/19 0452  NA 139 140  K 3.3* 3.6  CL 106 105  CO2 22 28  GLUCOSE 98 86  BUN 12 7*  CREATININE 0.66 0.69  CALCIUM 8.6* 8.3*  GFRNONAA >60 >60  GFRAA >60 >60  ANIONGAP 11 7    Total Protein  Date Value Ref Range Status  09/10/2019 6.3 (L) 6.5 - 8.1 g/dL Final  12/05/2016 6.3 6.0 - 8.5 g/dL Final   Albumin  Date Value Ref Range Status  09/10/2019 2.9 (L) 3.5 - 5.0 g/dL Final  12/05/2016 3.6 3.6 - 4.8 g/dL Final   AST  Date Value Ref Range Status  09/10/2019 12 (L) 15 - 41 U/L Final   ALT  Date Value Ref Range Status  09/10/2019 11 0 - 44 U/L Final   Alkaline Phosphatase  Date Value Ref Range Status  09/10/2019 72 38 - 126 U/L Final   Total Bilirubin  Date Value Ref Range Status  09/10/2019 0.8 0.3 - 1.2 mg/dL Final   Bilirubin Total  Date Value Ref Range Status  12/05/2016 0.3 0.0 - 1.2 mg/dL Final   Hematology Recent Labs  Lab 09/10/19 1305 09/11/19 0452  WBC 3.6* 3.5*  RBC 3.12* 2.84*  HGB 8.9* 8.1*  HCT 30.9* 27.4*  MCV 99.0 96.5  MCH 28.5 28.5   MCHC 28.8*  29.6*  RDW 15.9* 15.8*  PLT 176 189   Cardiac EnzymesNo results for input(s): TROPONINI in the last 168 hours. No results for input(s): TROPIPOC in the last 168 hours.  BNP Recent Labs  Lab 09/10/19 1305  BNP 2,476.7*    DDimer No results for input(s): DDIMER in the last 168 hours. TSH:  Lab Results  Component Value Date   TSH 0.295 (L) 08/15/2016   Lipids: Lab Results  Component Value Date   CHOL 98 (L) 12/05/2016   HDL 43 12/05/2016   LDLCALC 40 12/05/2016   TRIG 73 12/05/2016   CHOLHDL 2.3 12/05/2016   HgbA1c: Lab Results  Component Value Date   HGBA1C 5.4 06/24/2019    Radiology/Studies:  DG Chest Portable 1 View  Result Date: 09/10/2019 CLINICAL DATA:  Shortness of breath and chest pain EXAM: PORTABLE CHEST 1 VIEW COMPARISON:  Jul 21, 2019 FINDINGS: There is ill-defined airspace opacity in the left upper lobe. There is mild bibasilar atelectasis. Heart is upper normal in size with pulmonary vascularity normal. Patient is status post coronary artery bypass grafting. No adenopathy. There is postoperative change in the left humerus. IMPRESSION: Suspected pneumonia left upper lobe. Mild bibasilar atelectasis. Stable cardiac silhouette with postoperative changes. Followup PA and lateral chest radiographs recommended in 3-4 weeks following trial of antibiotic therapy to ensure resolution and exclude underlying malignancy. Electronically Signed   By: Lowella Grip III M.D.   On: 09/10/2019 12:55   Assessment and Plan:   1.  Acute on chronic combined systolic and diastolic CHF: -Has known history of ischemic cardiomyopathy.  Preoperative echocardiogram performed 06/17/2019 prior to arterial bifemoral bypass showed an LVEF at 60% however patient had profound complications in the postoperative setting including atrial fibrillation with RVR, hypotension and ultimately cardiogenic shock with PEA arrest requiring Impella placement, ETT mechanical ventilation and pressor  support.  Echo performed 06/24/2019 showed an LVEF at 20 to 25% with diffuse hypokinesis and preserved RV function.  LHC performed 06/27/2019 in the setting of the above showed unchanged coronary anatomy.  She was ultimately stabilized over several weeks and discharged to a skilled nursing facility. -She presented 09/10/2019 to [facility] with acute CHF exacerbation with a BNP elevated at 2476 with CXR consistent with fluid volume overload with suspected left upper lobe PNA -She was placed on empiric antibiotics per primary team and cardiology was asked to follow given long history of cardiomyopathy -Currently maintained on IV Lasix 40 mg twice daily, irbesartan 150 -Would consider the initiation of low-dose Entresto with up titration if BP tolerates>>to discuss with MD  -Also would benefit from the addition of spironolactone 12.5 -Weight, needs daily weights -I&O, net -1.3 L  2. Severe CAD with stable angina -Remote history of CABG in 1998 with most recent [facility] 06/05/2019 showing stable three vessel CAD s/p 3V CABG with known occlusion of all three grafts, moderately calcified mid LAD stenosis that is angiographically improved from cath in 2018, chronic occlusion proximal RCA and chronic occlusion obtuse marginal and distal Circumflex with recommendations to continue medical treatment for CAD -Maintained on ASA, high intensity atorvastatin, Plavix -Lopressor held in the setting of bradycardia and A>C HF  3. Paroxysmal atrial fibrillation: -Maintaining normal sinus rhythm/SB on amiodarone and Eliquis for anticoagulation for stroke risk reduction given a CHADS2VASC = 4 ( female, age 57, CAD,HTN ).  -Hemoglobin, 8. 9 on presentation>> down to 8.1 today -Daily labs  4. Hypertension: -Stable, 140/62, 135/52, 145/55 -Hold Lopressor in the setting of bradycardia and acute  on chronic CHF  5.  HLD: -Last LDL, 40 on 12/05/2016 -Continue high intensity atorvastatin  6.  Presumed community-acquired pneumonia per  CXR: -Left upper lobe PNA currently treated with Rocephin -Azithromycin discontinued secondary to prolonged QT interval (568ms) -Mag, 1.6 on 09/11/2019>>> would replace with 2 g IV infusion  7.  Bradycardia: -Heart rates in the mid to high 50 range -Patient is currently asymptomatic -Hold metoprolol in the setting of  A>CHF as well as bradycardia    For questions or updates, please contact [place] Please consult [email or web address] for contact info under Cardiology/STEMI.   Wendy Safe NP-C [place] Pager: [phone] 09/11/2019 4:38 PM  Patient seen and examined with the above-signed Advanced Practice Provider and/or Housestaff. I personally reviewed laboratory data, imaging studies and relevant notes. I independently examined the patient and formulated the important aspects of the plan. I have edited the note to reflect any of my changes or salient points. I have personally discussed the plan with the patient and/or family.  Complicated 69 y/o woman with multiple medical problems including COPD, CAD s/p CABG 1998, PAD. Recent prolonged hospitalization in 4/21 in setting of aortobifem surgery complicated by PEA arrest and cardiogenic shock requiring Impella support. EF dropped 65% -> 20-25%.  Has been doing relatively well lately. But over past 4 days has developed increasing SOB. On arrival to ER BNP 2,476 with CHF on CXX. hstrop negative. Denies any precipitating factors.  Has received IV lasix and now feels much better. Back to baseline  General:  Lying flat in bed. No resp difficulty HEENT: normal Neck: supple. JVP 7. Carotids 2+ bilat; no bruits. No lymphadenopathy or thryomegaly appreciated. Cor: PMI nondisplaced. Regular rate & rhythm. No rubs, gallops or murmurs. Lungs: dull at bases Abdomen: soft, nontender, nondistended. No hepatosplenomegaly. No bruits or masses. Good bowel sounds. Extremities: no cyanosis, clubbing, rash, edema Neuro: alert & orientedx3,  cranial nerves grossly intact. moves all 4 extremities w/o difficulty. Affect pleasant  She is much improved after IV lasix. JVP still up. Will give one more dose of IV lasix today. Repeat echo in am. Hopefully she should be able to go home soon. Supp K and Mag. Will need outpatient diuretics adjusted. Agree with switch to [facility] if BP stable. Also consider SGLT2i.    Glori Bickers, MD  9:18 PM

## 2019-09-12 ENCOUNTER — Inpatient Hospital Stay (HOSPITAL_COMMUNITY): Payer: Medicare HMO

## 2019-09-12 DIAGNOSIS — I5021 Acute systolic (congestive) heart failure: Secondary | ICD-10-CM

## 2019-09-12 DIAGNOSIS — I5043 Acute on chronic combined systolic (congestive) and diastolic (congestive) heart failure: Secondary | ICD-10-CM

## 2019-09-12 LAB — BASIC METABOLIC PANEL
Anion gap: 10 (ref 5–15)
BUN: 11 mg/dL (ref 8–23)
CO2: 29 mmol/L (ref 22–32)
Calcium: 8.5 mg/dL — ABNORMAL LOW (ref 8.9–10.3)
Chloride: 101 mmol/L (ref 98–111)
Creatinine, Ser: 0.91 mg/dL (ref 0.44–1.00)
GFR calc Af Amer: >60 mL/min (ref >60)
GFR calc non Af Amer: >60 mL/min (ref >60)
Glucose, Bld: 103 mg/dL — ABNORMAL HIGH (ref 70–99)
Potassium: 3.9 mmol/L (ref 3.5–5.1)
Sodium: 140 mmol/L (ref 135–145)

## 2019-09-12 LAB — ECHOCARDIOGRAM COMPLETE: Weight: 2109.36 oz

## 2019-09-12 LAB — MAGNESIUM: Magnesium: 2 mg/dL (ref 1.7–2.4)

## 2019-09-12 MED ORDER — SACUBITRIL-VALSARTAN 24-26 MG PO TABS
1.0000 | ORAL_TABLET | Freq: Two times a day (BID) | ORAL | Status: DC
  Administered 2019-09-12 – 2019-09-13 (×3): 1 via ORAL
  Filled 2019-09-12 (×3): qty 1

## 2019-09-12 MED ORDER — POTASSIUM CHLORIDE CRYS ER 20 MEQ PO TBCR
30.0000 meq | EXTENDED_RELEASE_TABLET | Freq: Once | ORAL | Status: AC
  Administered 2019-09-12: 30 meq via ORAL
  Filled 2019-09-12: qty 1

## 2019-09-12 MED ORDER — SPIRONOLACTONE 12.5 MG HALF TABLET
12.5000 mg | ORAL_TABLET | Freq: Every day | ORAL | Status: DC
  Filled 2019-09-12 (×2): qty 1

## 2019-09-12 MED ORDER — MAGNESIUM SULFATE 2 GM/50ML IV SOLN: 2.0000 g | Freq: Once | INTRAVENOUS | Status: DC

## 2019-09-12 NOTE — Progress Notes (Signed)
Patient ID: Wendy [name], female   DOB: 01/30/1951, 69 y.o.   MRN: [id number]     Advanced Heart Failure Rounding Note  PCP-Cardiologist: [name], MD   Subjective:    Patient is feeling better today, good diuresis yesterday.  No chest pain.    Objective:   Weight Range: 59.8 kg Body mass index is 24.91 kg/m.   Vital Signs:   Temp:  [97.9 F (36.6 C)-98.6 F (37 C)] 97.9 F (36.6 C) (07/08 0500) Pulse Rate:  [52-60] 52 (07/08 0500) Resp:  [18-20] 18 (07/08 0500) BP: (123-156)/(49-65) 128/51 (07/08 0500) SpO2:  [96 %-99 %] 99 % (07/08 0500) Weight:  [59.8 kg] 59.8 kg (07/08 0500) Last BM Date: 09/10/19  Weight change: Filed Weights   09/12/19 0500  Weight: 59.8 kg    Intake/Output:   Intake/Output Summary (Last 24 hours) at 09/12/2019 0919 Last data filed at 09/12/2019 0500 Gross per 24 hour  Intake 721.2 ml  Output 3675 ml  Net -2953.8 ml      Physical Exam    General:  Well appearing. No resp difficulty HEENT: Normal Neck: Supple. JVP 8-9 cm. Carotids 2+ bilat; no bruits. No lymphadenopathy or thyromegaly appreciated. Cor: PMI nondisplaced. Regular rate & rhythm. No rubs, gallops or murmurs. Lungs: Clear Abdomen: Soft, nontender, nondistended. No hepatosplenomegaly. No bruits or masses. Good bowel sounds. Extremities: No cyanosis, clubbing, rash, edema Neuro: Alert & orientedx3, cranial nerves grossly intact. moves all 4 extremities w/o difficulty. Affect pleasant   Telemetry   NSR 60s (personally reviewed)  Labs    CBC Recent Labs    09/10/19 1305 09/11/19 0452  WBC 3.6* 3.5*  NEUTROABS 2.7 2.3  HGB 8.9* 8.1*  HCT 30.9* 27.4*  MCV 99.0 96.5  PLT 176 440   Basic Metabolic Panel Recent Labs    09/10/19 1305 09/10/19 1646 09/11/19 0452 09/11/19 1344 09/12/19 0358  NA   < >  --  140  --  140  K   < >  --  3.6  --  3.9  CL   < >  --  105  --  101  CO2   < >  --  28  --  29  GLUCOSE   < >  --  86  --  103*  BUN   < >  --  7*  --   11  CREATININE   < >  --  0.69  --  0.91  CALCIUM   < >  --  8.3*  --  8.5*  MG  --  1.6*  --  1.6*  --    < > = values in this interval not displayed.   Liver Function Tests Recent Labs    09/10/19 1305  AST 12*  ALT 11  ALKPHOS 72  BILITOT 0.8  PROT 6.3*  ALBUMIN 2.9*   No results for input(s): LIPASE, AMYLASE in the last 72 hours. Cardiac Enzymes No results for input(s): CKTOTAL, CKMB, CKMBINDEX, TROPONINI in the last 72 hours.  BNP: BNP (last 3 results) Recent Labs    06/19/19 1239 09/10/19 1305  BNP 366.7* 2,476.7*    ProBNP (last 3 results) No results for input(s): PROBNP in the last 8760 hours.   D-Dimer No results for input(s): DDIMER in the last 72 hours. Hemoglobin A1C No results for input(s): HGBA1C in the last 72 hours. Fasting Lipid Panel No results for input(s): CHOL, HDL, LDLCALC, TRIG, CHOLHDL, LDLDIRECT in the last 72 hours. Thyroid Function  Tests No results for input(s): TSH, T4TOTAL, T3FREE, THYROIDAB in the last 72 hours.  Invalid input(s): FREET3  Other results:   Imaging     No results found.   Medications:     Scheduled Medications: . amiodarone  100 mg Oral Daily  . apixaban  5 mg Oral BID  . aspirin  81 mg Oral Daily  . atorvastatin  80 mg Oral q1800  . buPROPion  150 mg Oral Daily  . ezetimibe  10 mg Oral Daily  . ferrous sulfate  325 mg Oral BID WC  . furosemide  40 mg Intravenous BID  . guaiFENesin  600 mg Oral BID  . oxyCODONE  10 mg Oral TID  . pramipexole  0.25 mg Oral QHS  . sacubitril-valsartan  1 tablet Oral BID  . sodium chloride flush  3 mL Intravenous Q12H  . spironolactone  12.5 mg Oral Daily     Infusions: . sodium chloride 250 mL (09/11/19 1426)  . cefTRIAXone (ROCEPHIN)  IV Stopped (09/11/19 1500)  . magnesium sulfate bolus IVPB       PRN Medications:  sodium chloride, acetaminophen, ALPRAZolam, diclofenac Sodium, ipratropium-albuterol, sodium chloride flush   Assessment/Plan   1. Acute  on chronic systolic CHF: Ischemic cardiomyopathy.  Echo in 4/21 with EF 20-25%.  She has diuresed well and is feeling better.  Mild volume overload on exam.  - Repeat echo this admission, would consider for ICD in future if EF persistently < 35%.  Not CRT candidate with narrow QRS.  - Continue Lasix 40 mg IV bid for 1 more day.  - Start Entresto 24/26 bid.  - Start spironolactone 12.5 daily.  - Eventual beta blocker but keep eye on HR (50s-60s).  2. CAD: s/p CABG. Last cath in 5/21 with occluded grafts, occluded RCA, OM1 and distal LCx; 60% LAD stenosis.  No interventional target. No chest pain.  - Can stop Plavix as she is on Eliquis.  - Continue atorvastatin 80 mg daily.  3. PAD: S/p aortobifemoral bypass in 4/21.  4. Atrial fibrillation: Paroxysmal.  She is in NSR.  - Eliquis 5 mg bid.  - Continue amiodarone 100 mg daily.  5. Possible PNA: LUL PNA on CXR.  - She is on ceftriaxone.   Anticipate 1 more day diuresis and medication titration, possible home tomorrow.   Length of Stay: 2  Loralie Champagne, MD  09/12/2019, 9:19 AM  Advanced Heart Failure Team Pager [phone] (M-F; 7a - 4p)  Please contact [facility] for night-coverage after hours (4p -7a ) and weekends on [email or web address]

## 2019-09-12 NOTE — Progress Notes (Signed)
  Echocardiogram 2D Echocardiogram has been performed.  Michiel Cowboy 09/12/2019, 3:37 PM

## 2019-09-12 NOTE — Progress Notes (Signed)
[facility]  PROGRESS NOTE  ILLANA NOLTING UVO:[id number] DOB: November 14, 1950 DOA: 09/10/2019 PCP: Josetta Huddle, MD   Brief HPI:   69 year old female with a history of hypertension, hyperlipidemia, PAF, CAD s/p CABG, PVD, COPD came with progressively worsening shortness of breath for past 4 days.  Patient had cough which was nonproductive.  Patient received 2 shots of COVID-19 vaccine.  She also has been compliant with taking her Eliquis at home.  In the ED her BNP was elevated to 2467.  Chest x-ray showed left upper lobe opacity.  She was started on empiric antibiotic Rocephin and azithromycin for community-acquired pneumonia.    Subjective   Patient seen and examined, diuresed well with IV Lasix.  Breathing is improved.   Assessment/Plan:     1. Acute on chronic combined systolic and diastolic CHF-patient presented with worsening shortness of breath, O2 sats dropped to 84% with improvement on 2 L/min of oxygen.  BNP elevated to 2476.  Last EF 20-25% with left ventricular global hypokinesis in April 2021.  Started on Lasix 40 mg IV twice daily.  Patient is clinically improving with good diuretic response to Lasix.  Cardiology recommends to continue IV Lasix for 1 more day and then switch to p.o. Lasix from tomorrow. 2. ?  Community-acquired pneumonia-chest x-ray concerning for left upper lobe pneumonia.  Azithromycin discontinued due to prolonged QT interval.  Continue Rocephin. 3. Prolonged QTc interval-QTc interval noted to be elevated 559, magnesium was low and was replaced yesterday.  Today magnesium is 2.0.  4. CAD s/p CABG-cardiology recommends to stop Plavix.  Patient is already on Eliquis. 5. Paroxysmal atrial fibrillation-patient is sinus rhythm.  Continue Xarelto for anticoagulation.  Continue metoprolol 50 mg p.o. twice daily. 6. Peripheral arterial disease-patient has history of aortoiliac occlusive disease s/p aortobifemoral bypass with reimplantation of right profunda on  aortofemoral bifemoral limb by Dr. Donzetta Matters on 06/18/2019.  Continue statin and aspirin. 7. Hypertension-blood pressure stable, continue metoprolol, valsartan/HCTZ 8. Anxiety-continue Wellbutrin and Xanax.  Zoloft on hold due to prolonged QTC.    SpO2: 94 % O2 Flow Rate (L/min): 2 L/min     Lab Results  Component Value Date   SARSCOV2NAA NEGATIVE 09/10/2019   SARSCOV2NAA NEGATIVE 07/21/2019   SARSCOV2NAA NEGATIVE 07/10/2019   [place] NEGATIVE 06/14/2019      CBC: Recent Labs  Lab 09/10/19 1305 09/11/19 0452  WBC 3.6* 3.5*  NEUTROABS 2.7 2.3  HGB 8.9* 8.1*  HCT 30.9* 27.4*  MCV 99.0 96.5  PLT 176 742    Basic Metabolic Panel: Recent Labs  Lab 09/10/19 1305 09/10/19 1646 09/11/19 0452 09/11/19 1344 09/12/19 0358  NA 139  --  140  --  140  K 3.3*  --  3.6  --  3.9  CL 106  --  105  --  101  CO2 22  --  28  --  29  GLUCOSE 98  --  86  --  103*  BUN 12  --  7*  --  11  CREATININE 0.66  --  0.69  --  0.91  CALCIUM 8.6*  --  8.3*  --  8.5*  MG  --  1.6*  --  1.6* 2.0     Liver Function Tests: Recent Labs  Lab 09/10/19 1305  AST 12*  ALT 11  ALKPHOS 72  BILITOT 0.8  PROT 6.3*  ALBUMIN 2.9*     DVT prophylaxis: Eliquis  Code Status: Full code  Family Communication: No family at bedside  Status is: Inpatient  Dispo: The patient is from: Home              Anticipated d/c is to: Home              Anticipated d/c date is: 09/13/2019              Patient currently not medically stable for discharge  Barrier to discharge-ongoing treatment for pneumonia and diastolic heart failure.        Scheduled medications:  . amiodarone  100 mg Oral Daily  . apixaban  5 mg Oral BID  . aspirin  81 mg Oral Daily  . atorvastatin  80 mg Oral q1800  . buPROPion  150 mg Oral Daily  . ezetimibe  10 mg Oral Daily  . ferrous sulfate  325 mg Oral BID WC  . guaiFENesin  600 mg Oral BID  . oxyCODONE  10 mg Oral TID  . pramipexole  0.25 mg Oral QHS  .  sacubitril-valsartan  1 tablet Oral BID  . sodium chloride flush  3 mL Intravenous Q12H  . spironolactone  12.5 mg Oral Daily    Consultants:    Procedures:    Antibiotics:   Anti-infectives (From admission, onward)   Start     Dose/Rate Route Frequency Ordered Stop   09/11/19 1200  cefTRIAXone (ROCEPHIN) 1 g in sodium chloride 0.9 % 100 mL IVPB     Discontinue     1 g 200 mL/hr over 30 Minutes Intravenous Every 24 hours 09/10/19 1836     09/10/19 1315  cefTRIAXone (ROCEPHIN) 1 g in sodium chloride 0.9 % 100 mL IVPB        1 g 200 mL/hr over 30 Minutes Intravenous  Once 09/10/19 1304 09/10/19 1556   09/10/19 1315  azithromycin (ZITHROMAX) 500 mg in sodium chloride 0.9 % 250 mL IVPB        500 mg 250 mL/hr over 60 Minutes Intravenous  Once 09/10/19 1304 09/10/19 1557       Objective   Vitals:   09/11/19 1700 09/11/19 2018 09/12/19 0500 09/12/19 1612  BP: (!) 156/65 (!) 123/49 (!) 128/51 (!) 128/56  Pulse: (!) 58 (!) 57 (!) 52 (!) 58  Resp: 18 18 18 20   Temp: 98.3 F (36.8 C) 98.6 F (37 C) 97.9 F (36.6 C) 98.2 F (36.8 C)  TempSrc: Oral Oral Oral Oral  SpO2: 99% 96% 99% 94%  Weight:   59.8 kg     Intake/Output Summary (Last 24 hours) at 09/12/2019 1652 Last data filed at 09/12/2019 0500 Gross per 24 hour  Intake 361.2 ml  Output 2275 ml  Net -1913.8 ml    07/06 1901 - 07/08 0700 In: 724.2 [P.O.:600; I.V.:24.2] Out: 3675 [Urine:3675]  Filed Weights   09/12/19 0500  Weight: 59.8 kg    Physical Examination:   General-appears in no acute distress  Heart-S1-S2, regular, no murmur auscultated  Lungs-clear to auscultation bilaterally, no wheezing or crackles auscultated  Abdomen-soft, nontender, no organomegaly  Extremities-no edema in the lower extremities  Neuro-alert, oriented x3, no focal deficit noted    Data Reviewed:   Recent Results (from the past 240 hour(s))  SARS Coronavirus 2 by RT PCR (hospital order, performed in [facility]  hospital lab) Nasopharyngeal Nasopharyngeal Swab     Status: None   Collection Time: 09/10/19  7:05 PM   Specimen: Nasopharyngeal Swab  Result Value Ref Range Status   SARS Coronavirus 2 NEGATIVE NEGATIVE Final  Comment: (NOTE) SARS-CoV-2 target nucleic acids are NOT DETECTED.  The SARS-CoV-2 RNA is generally detectable in upper and lower respiratory specimens during the acute phase of infection. The lowest concentration of SARS-CoV-2 viral copies this assay can detect is 250 copies / mL. A negative result does not preclude SARS-CoV-2 infection and should not be used as the sole basis for treatment or other patient management decisions.  A negative result may occur with improper specimen collection / handling, submission of specimen other than nasopharyngeal swab, presence of viral mutation(s) within the areas targeted by this assay, and inadequate number of viral copies (<250 copies / mL). A negative result must be combined with clinical observations, patient history, and epidemiological information.  Fact Sheet for Patients:   StrictlyIdeas.no  Fact Sheet for Healthcare Providers: BankingDealers.co.za  This test is not yet approved or  cleared by the [place] FDA and has been authorized for detection and/or diagnosis of SARS-CoV-2 by FDA under an Emergency Use Authorization (EUA).  This EUA will remain in effect (meaning this test can be used) for the duration of the COVID-19 declaration under Section 564(b)(1) of the Act, 21 U.S.C. section 360bbb-3(b)(1), unless the authorization is terminated or revoked sooner.  Performed at [facility], [place] [address], [place], [place] [address]       Recent Labs    06/19/19 1239 09/10/19 1305  BNP 366.7* 2,476.7*     Studies:  ECHOCARDIOGRAM COMPLETE  Result Date: 09/12/2019    ECHOCARDIOGRAM REPORT   Patient Name:   Wendy [name] Date of Exam: 09/12/2019 Medical Rec  #:  [id number]        Height:       61.0 in Accession #:    [id number]       Weight:       131.8 lb Date of Birth:  July 04, 1950        BSA:          1.582 m Patient Age:    56 years         BP:           128/51 mmHg Patient Gender: F                HR:           59 bpm. Exam Location:  Inpatient Procedure: 2D Echo, Cardiac Doppler and Color Doppler Indications:    CHF-Acute Systolic 419.37 / T02.40  History:        Patient has prior history of Echocardiogram examinations, most                 recent 06/26/2019. CHF, CAD, Prior CABG, COPD,                 Arrythmias:non-specific ST changes, Signs/Symptoms:Chest Pain;                 Risk Factors:Sleep Apnea, Dyslipidemia, Hypertension and Former                 Smoker. GERD. PAD.  Sonographer:    Vickie Epley RDCS Referring Phys: [id number] DANIEL R BENSIMHON IMPRESSIONS  1. Left ventricular ejection fraction, by estimation, is 45 to 50%. The left ventricle has mildly decreased function. The left ventricle demonstrates regional wall motion abnormalities with basal inferior hypokinesis and inferoseptal hypokinesis. Left ventricular diastolic parameters are consistent with Grade I diastolic dysfunction (impaired relaxation).  2. Right ventricular systolic function is normal. The right ventricular size is normal. Tricuspid regurgitation signal  is inadequate for assessing PA pressure.  3. The mitral valve is normal in structure. Trivial mitral valve regurgitation. No evidence of mitral stenosis.  4. The aortic valve is tricuspid. Aortic valve regurgitation is not visualized. No aortic stenosis is present.  5. The inferior vena cava is normal in size with greater than 50% respiratory variability, suggesting right atrial pressure of 3 mmHg. FINDINGS  Left Ventricle: Left ventricular ejection fraction, by estimation, is 45 to 50%. The left ventricle has mildly decreased function. The left ventricle demonstrates regional wall motion abnormalities. The left ventricular internal cavity  size was normal in size. There is no left ventricular hypertrophy. Left ventricular diastolic parameters are consistent with Grade I diastolic dysfunction (impaired relaxation). Right Ventricle: The right ventricular size is normal. No increase in right ventricular wall thickness. Right ventricular systolic function is normal. Tricuspid regurgitation signal is inadequate for assessing PA pressure. Left Atrium: Left atrial size was normal in size. Right Atrium: Right atrial size was normal in size. Pericardium: Trivial pericardial effusion is present. Mitral Valve: The mitral valve is normal in structure. Mild mitral annular calcification. Trivial mitral valve regurgitation. No evidence of mitral valve stenosis. Tricuspid Valve: The tricuspid valve is normal in structure. Tricuspid valve regurgitation is trivial. Aortic Valve: The aortic valve is tricuspid. Aortic valve regurgitation is not visualized. No aortic stenosis is present. Pulmonic Valve: The pulmonic valve was normal in structure. Pulmonic valve regurgitation is trivial. Aorta: The aortic root is normal in size and structure. Venous: The inferior vena cava is normal in size with greater than 50% respiratory variability, suggesting right atrial pressure of 3 mmHg. IAS/Shunts: No atrial level shunt detected by color flow Doppler.  LEFT VENTRICLE PLAX 2D LVIDd:         4.80 cm      Diastology LVIDs:         3.30 cm      LV e' lateral:   5.27 cm/s LV PW:         0.80 cm      LV E/e' lateral: 14.7 LV IVS:        0.80 cm      LV e' medial:    3.41 cm/s LVOT diam:     1.80 cm      LV E/e' medial:  22.8 LV SV:         65 LV SV Index:   41 LVOT Area:     2.54 cm  LV Volumes (MOD) LV vol d, MOD A2C: 118.0 ml LV vol d, MOD A4C: 98.6 ml LV vol s, MOD A2C: 59.7 ml LV vol s, MOD A4C: 53.4 ml LV SV MOD A2C:     58.3 ml LV SV MOD A4C:     98.6 ml LV SV MOD BP:      54.1 ml RIGHT VENTRICLE RV S prime:     6.16 cm/s TAPSE (M-mode): 1.3 cm LEFT ATRIUM             Index        RIGHT ATRIUM           Index LA diam:        3.50 cm 2.21 cm/m  RA Area:     12.10 cm LA Vol (A2C):   38.4 ml 24.27 ml/m RA Volume:   25.40 ml  16.05 ml/m LA Vol (A4C):   43.7 ml 27.62 ml/m LA Biplane Vol: 43.3 ml 27.37 ml/m  AORTIC VALVE LVOT Vmax:   125.00 cm/s  LVOT Vmean:  78.800 cm/s LVOT VTI:    0.257 m  AORTA Ao Root diam: 2.70 cm MITRAL VALVE MV Area (PHT): 1.98 cm     SHUNTS MV Decel Time: 384 msec     Systemic VTI:  0.26 m MV E velocity: 77.60 cm/s   Systemic Diam: 1.80 cm MV A velocity: 103.00 cm/s MV E/A ratio:  0.75 Loralie Champagne MD Electronically signed by Loralie Champagne MD Signature Date/Time: 09/12/2019/4:36:08 PM    Final        Oswald Hillock   [facility] If 7PM-7AM, please contact night-coverage at [email or web address], Office  [phone]   09/12/2019, 4:52 PM  LOS: 2 days

## 2019-09-12 NOTE — Plan of Care (Signed)
Discussed with patient plan of care for the evening, pain management and bedtime medications with some teach back displayed 

## 2019-09-13 LAB — CBC
HCT: 33.1 % — ABNORMAL LOW (ref 36.0–46.0)
Hemoglobin: 9.9 g/dL — ABNORMAL LOW (ref 12.0–15.0)
MCH: 28.4 pg (ref 26.0–34.0)
MCHC: 29.9 g/dL — ABNORMAL LOW (ref 30.0–36.0)
MCV: 95.1 fL (ref 80.0–100.0)
Platelets: 240 10*3/uL (ref 150–400)
RBC: 3.48 MIL/uL — ABNORMAL LOW (ref 3.87–5.11)
RDW: 15.7 % — ABNORMAL HIGH (ref 11.5–15.5)
WBC: 5.1 10*3/uL (ref 4.0–10.5)
nRBC: 0 % (ref 0.0–0.2)

## 2019-09-13 LAB — BASIC METABOLIC PANEL
Anion gap: 8 (ref 5–15)
BUN: 13 mg/dL (ref 8–23)
CO2: 30 mmol/L (ref 22–32)
Calcium: 8.6 mg/dL — ABNORMAL LOW (ref 8.9–10.3)
Chloride: 100 mmol/L (ref 98–111)
Creatinine, Ser: 0.99 mg/dL (ref 0.44–1.00)
GFR calc Af Amer: >60 mL/min (ref >60)
GFR calc non Af Amer: 59 mL/min — ABNORMAL LOW (ref >60)
Glucose, Bld: 115 mg/dL — ABNORMAL HIGH (ref 70–99)
Potassium: 4.3 mmol/L (ref 3.5–5.1)
Sodium: 138 mmol/L (ref 135–145)

## 2019-09-13 MED ORDER — SPIRONOLACTONE 25 MG PO TABS
25.0000 mg | ORAL_TABLET | Freq: Every day | ORAL | Status: DC
  Administered 2019-09-13: 25 mg via ORAL
  Filled 2019-09-13: qty 1

## 2019-09-13 MED ORDER — FUROSEMIDE 20 MG PO TABS: 20.0000 mg | ORAL_TABLET | Freq: Every day | ORAL | 3 refills | Status: DC

## 2019-09-13 MED ORDER — SACUBITRIL-VALSARTAN 24-26 MG PO TABS: 1.0000 | ORAL_TABLET | Freq: Two times a day (BID) | ORAL | 2 refills | Status: DC

## 2019-09-13 MED ORDER — CEFUROXIME AXETIL 500 MG PO TABS: 500.0000 mg | ORAL_TABLET | Freq: Two times a day (BID) | ORAL | 0 refills | Status: AC

## 2019-09-13 MED ORDER — CEFUROXIME AXETIL 500 MG PO TABS: 500.0000 mg | ORAL_TABLET | Freq: Two times a day (BID) | ORAL | 0 refills | Status: DC

## 2019-09-13 MED FILL — CEFUROXIME AXETIL 500 MG TA: 500 | 3 days supply | Qty: 6 | Fill #0

## 2019-09-13 MED FILL — ENTRESTO 24 MG-26 MG TABLET: 24-26 | 30 days supply | Qty: 60 | Fill #0 | Status: TO

## 2019-09-13 NOTE — Consult Note (Signed)
   [facility] CM Inpatient Consult   09/13/2019  ADELHEID HOGGARD 1950/06/07 [id number]  Patient screened for [facility] services due to high unplanned readmission risk score and 3 hospital admissions within past 6 months. Went to bedside to explain [facility] program to patient. HIPAA verified.   Ms. Blaney verbally agrees to receive [facility] CM RN follow up for assessment of community needs. She currently denies need for transportation or meals. Patient states that receives medication delivery from [facility] and denies need for financial assistance with medication. States that she does have COPD and welcomes education for chronic disease management. Referral to [facility] placed.  Of note, [facility] Care Management services does not replace or interfere with any services that are arranged by inpatient case management or social work.  Netta Cedars, MSN, [place] [facility] Liaison Nurse Mobile Phone [phone]  Toll free office [phone]

## 2019-09-13 NOTE — Progress Notes (Signed)
Patient discharged to private vehicle via wheelchair to family. She got her meds from [facility]. No further questions

## 2019-09-13 NOTE — Discharge Summary (Addendum)
Physician Discharge Summary  Wendy [name]:[id number] DOB: 11-02-1950 DOA: 09/10/2019  PCP: Josetta Huddle, MD  Admit date: 09/10/2019 Discharge date: 09/13/2019  Time spent: 50 minutes  Recommendations for Outpatient Follow-up:  1. Follow-up heart failure clinic on 09/20/2019 2. Follow-up PCP in 2 weeks   Discharge Diagnoses:   Principal diagnosis Acute on chronic systolic and diastolic heart failure Community-acquired pneumonia Active Problems:   Hx of CABG   Hypertension   PAD (peripheral artery disease) (HCC)   COPD (chronic obstructive pulmonary disease) (HCC)   Prolonged QT interval   Acute respiratory failure with hypoxia (HCC)   Hypokalemia   Hypomagnesemia   Discharge Condition: Stable  Diet recommendation: Heart healthy diet  Filed Weights   09/12/19 0500 09/13/19 0351  Weight: 59.8 kg 59.6 kg    History of present illness:  69 year old female with a history of hypertension, hyperlipidemia, PAF, CAD s/p CABG, PVD, COPD came with progressively worsening shortness of breath for past 4 days.  Patient had cough which was nonproductive.  Patient received 2 shots of COVID-19 vaccine.  She also has been compliant with taking her Eliquis at home.  In the ED her BNP was elevated to 2467.  Chest x-ray showed left upper lobe opacity.  She was started on empiric antibiotic Rocephin and azithromycin for community-acquired pneumonia.  Hospital Course:   1. Acute on chronic combined systolic and diastolic CHF-patient presented with worsening shortness of breath, O2 sats dropped to 84% with improvement on 2 L/min of oxygen.  BNP elevated to 2476.  Last EF 20-25% with left ventricular global hypokinesis in April 2021.  Started on Lasix 40 mg IV twice daily.  Patient improved with IV diuresis.  At this time diuretics have been changed to p.o. Lasix.  Patient was starting Lasix 20 mg p.o. daily from tomorrow morning.  Patient also started on Entresto 24-26 1 tablet p.o. twice  daily. 2. ?  Community-acquired pneumonia-chest x-ray concerning for left upper lobe pneumonia.  Azithromycin discontinued due to prolonged QT interval.    Patient was treated with Rocephin IV for 3 days.  We will discharge her on Ceftin 500 mg p.o. twice daily for 3 more days. 3. Prolonged QTc interval-QTc interval noted to be elevated 559, magnesium was low and was replaced yesterday.  Today magnesium is 2.0.  4. CAD s/p CABG-cardiology recommends to stop Plavix.  Patient is already on Eliquis.  Plavix will be discontinued as per cardiology rec patient. 5. Paroxysmal atrial fibrillation-patient is sinus rhythm.  Continue Xarelto for anticoagulation.  Cardiologist recommended to stop metoprolol.  Heart failure team to address starting beta-blocker as outpatient. 6. Peripheral arterial disease-patient has history of aortoiliac occlusive disease s/p aortobifemoral bypass with reimplantation of right profunda on aortofemoral bifemoral limb by Dr. Donzetta Matters on 06/18/2019.  Continue statin and aspirin. 7. Hypertension-blood pressure stable, continue Entresto, Aldactone.  Will discontinue Cozaar, valsartan/HCTZ as patient has been started on Entresto. 8. Anxiety-continue Wellbutrin and Xanax.    Continue Zoloft.   Procedures:    Consultations:  Cardiology/heart failure team  Discharge Exam: Vitals:   09/13/19 0355 09/13/19 0921  BP: (!) 107/57 91/60  Pulse:    Resp: 20   Temp: 98 F (36.7 C)   SpO2: 90%     General: Appears in no acute distress Cardiovascular: S1-S2, regular, no murmur auscultated Respiratory: Clear to auscultation bilaterally  Discharge Instructions   Discharge Instructions    Diet - low sodium heart healthy   Complete by: As directed  Increase activity slowly   Complete by: As directed      Allergies as of 09/13/2019      Reactions   Benadryl [diphenhydramine] Shortness Of Breath   Penicillins Diarrhea   Did it involve swelling of the face/tongue/throat, SOB,  or low BP? No Did it involve sudden or severe rash/hives, skin peeling, or any reaction on the inside of your mouth or nose? No Did you need to seek medical attention at a hospital or doctor's office? No When did it last happen?2 months If all above answers are "NO", may proceed with cephalosporin use.   Adhesive [tape] Other (See Comments)   Burning of skin   Amoxicillin Diarrhea, Other (See Comments)   Has patient had a PCN reaction causing immediate rash, facial/tongue/throat swelling, SOB or lightheadedness with hypotension: No Has patient had a PCN reaction causing severe rash involving mucus membranes or skin necrosis: No Has patient had a PCN reaction that required hospitalization No Has patient had a PCN reaction occurring within the last 10 years: Yes -- noted rxn as diarrhea If all of the above answers are "NO", then may proceed with Cephalosporin use.   Cyclobenzaprine Other (See Comments)   Causes restless leg syndrome Restless syndrome   Hydrocodone Itching      Medication List    STOP taking these medications   clopidogrel 75 MG tablet Commonly known as: PLAVIX   losartan 25 MG tablet Commonly known as: COZAAR   metoprolol tartrate 50 MG tablet Commonly known as: LOPRESSOR   valsartan-hydrochlorothiazide 160-12.5 MG tablet Commonly known as: DIOVAN-HCT     TAKE these medications   acetaminophen 500 MG tablet Commonly known as: TYLENOL Take 500-1,000 mg by mouth every 6 (six) hours as needed (for pain.).   albuterol 108 (90 Base) MCG/ACT inhaler Commonly known as: VENTOLIN HFA Inhale 2 puffs into the lungs every 6 (six) hours as needed for wheezing or shortness of breath.   ALPRAZolam 0.5 MG tablet Commonly known as: XANAX Take 0.5 mg by mouth 3 (three) times daily as needed for anxiety.   amiodarone 100 MG tablet Commonly known as: PACERONE Take 1 tablet (100 mg total) by mouth daily.   apixaban 5 MG Tabs tablet Commonly known as: Eliquis Take  1 tablet (5 mg total) by mouth 2 (two) times daily.   aspirin 81 MG chewable tablet Chew 1 tablet (81 mg total) by mouth daily.   atorvastatin 80 MG tablet Commonly known as: LIPITOR TAKE ONE TABLET BY MOUTH DAILY AT 6PM What changed:   how much to take  how to take this  when to take this   buPROPion 150 MG 24 hr tablet Commonly known as: WELLBUTRIN XL Take 150 mg by mouth daily.   cefUROXime 500 MG tablet Commonly known as: CEFTIN Take 1 tablet (500 mg total) by mouth 2 (two) times daily with a meal for 3 days.   diclofenac Sodium 1 % Gel Commonly known as: VOLTAREN Apply 1 g topically 4 (four) times daily as needed for pain.   ezetimibe 10 MG tablet Commonly known as: ZETIA Take 10 mg by mouth daily.   furosemide 20 MG tablet Commonly known as: LASIX Take 1 tablet (20 mg total) by mouth daily.   gabapentin 300 MG capsule Commonly known as: NEURONTIN Take 300 mg by mouth 3 (three) times daily as needed for pain.   nitroGLYCERIN 0.4 MG SL tablet Commonly known as: NITROSTAT Place 1 tablet (0.4 mg total) under the tongue every 5 (  five) minutes as needed for chest pain.   Oxycodone HCl 10 MG Tabs Take 1 tablet (10 mg total) by mouth 3 (three) times daily.   pantoprazole 40 MG tablet Commonly known as: PROTONIX Take 1 tablet (40 mg total) by mouth daily.   pramipexole 0.25 MG tablet Commonly known as: MIRAPEX Take 0.25 mg by mouth at bedtime.   promethazine 12.5 MG tablet Commonly known as: PHENERGAN Take 1 tablet (12.5 mg total) by mouth every 6 (six) hours as needed for nausea or vomiting.   Ranexa 1000 MG SR tablet Generic drug: ranolazine Take 1,000 mg by mouth 2 (two) times daily.   sacubitril-valsartan 24-26 MG Commonly known as: ENTRESTO Take 1 tablet by mouth 2 (two) times daily.   sertraline 100 MG tablet Commonly known as: ZOLOFT Take 150 mg by mouth at bedtime.   spironolactone 25 MG tablet Commonly known as: ALDACTONE Take 1 tablet  (25 mg total) by mouth daily.      Allergies  Allergen Reactions  . Benadryl [Diphenhydramine] Shortness Of Breath  . Penicillins Diarrhea    Did it involve swelling of the face/tongue/throat, SOB, or low BP? No Did it involve sudden or severe rash/hives, skin peeling, or any reaction on the inside of your mouth or nose? No Did you need to seek medical attention at a hospital or doctor's office? No When did it last happen?2 months If all above answers are "NO", may proceed with cephalosporin use.  . Adhesive [Tape] Other (See Comments)    Burning of skin  . Amoxicillin Diarrhea and Other (See Comments)    Has patient had a PCN reaction causing immediate rash, facial/tongue/throat swelling, SOB or lightheadedness with hypotension: No Has patient had a PCN reaction causing severe rash involving mucus membranes or skin necrosis: No Has patient had a PCN reaction that required hospitalization No Has patient had a PCN reaction occurring within the last 10 years: Yes -- noted rxn as diarrhea If all of the above answers are "NO", then may proceed with Cephalosporin use.   . Cyclobenzaprine Other (See Comments)    Causes restless leg syndrome Restless syndrome  . Hydrocodone Itching    Follow-up Information     [facility] Follow up on 09/20/2019.   Specialty: Cardiology Why: at 9:40 [place] [address]  Contact information: [address] [id number] [place] [phone]               The results of significant diagnostics from this hospitalization (including imaging, microbiology, ancillary and laboratory) are listed below for reference.    Significant Diagnostic Studies: DG Chest Portable 1 View  Result Date: 09/10/2019 CLINICAL DATA:  Shortness of breath and chest pain EXAM: PORTABLE CHEST 1 VIEW COMPARISON:  Jul 21, 2019 FINDINGS: There is ill-defined airspace opacity in the left upper lobe.  There is mild bibasilar atelectasis. Heart is upper normal in size with pulmonary vascularity normal. Patient is status post coronary artery bypass grafting. No adenopathy. There is postoperative change in the left humerus. IMPRESSION: Suspected pneumonia left upper lobe. Mild bibasilar atelectasis. Stable cardiac silhouette with postoperative changes. Followup PA and lateral chest radiographs recommended in 3-4 weeks following trial of antibiotic therapy to ensure resolution and exclude underlying malignancy. Electronically Signed   By: Lowella Grip III M.D.   On: 09/10/2019 12:55   ECHOCARDIOGRAM COMPLETE  Result Date: 09/12/2019    ECHOCARDIOGRAM REPORT   Patient Name:   Wendy [name] Date  of Exam: 09/12/2019 Medical Rec #:  [id number]        Height:       61.0 in Accession #:    [id number]       Weight:       131.8 lb Date of Birth:  12-26-1950        BSA:          1.582 m Patient Age:    26 years         BP:           128/51 mmHg Patient Gender: F                HR:           59 bpm. Exam Location:  Inpatient Procedure: 2D Echo, Cardiac Doppler and Color Doppler Indications:    CHF-Acute Systolic 992.42 / A83.41  History:        Patient has prior history of Echocardiogram examinations, most                 recent 06/26/2019. CHF, CAD, Prior CABG, COPD,                 Arrythmias:non-specific ST changes, Signs/Symptoms:Chest Pain;                 Risk Factors:Sleep Apnea, Dyslipidemia, Hypertension and Former                 Smoker. GERD. PAD.  Sonographer:    Vickie Epley RDCS Referring Phys: [id number] DANIEL R BENSIMHON IMPRESSIONS  1. Left ventricular ejection fraction, by estimation, is 45 to 50%. The left ventricle has mildly decreased function. The left ventricle demonstrates regional wall motion abnormalities with basal inferior hypokinesis and inferoseptal hypokinesis. Left ventricular diastolic parameters are consistent with Grade I diastolic dysfunction (impaired relaxation).  2. Right ventricular  systolic function is normal. The right ventricular size is normal. Tricuspid regurgitation signal is inadequate for assessing PA pressure.  3. The mitral valve is normal in structure. Trivial mitral valve regurgitation. No evidence of mitral stenosis.  4. The aortic valve is tricuspid. Aortic valve regurgitation is not visualized. No aortic stenosis is present.  5. The inferior vena cava is normal in size with greater than 50% respiratory variability, suggesting right atrial pressure of 3 mmHg. FINDINGS  Left Ventricle: Left ventricular ejection fraction, by estimation, is 45 to 50%. The left ventricle has mildly decreased function. The left ventricle demonstrates regional wall motion abnormalities. The left ventricular internal cavity size was normal in size. There is no left ventricular hypertrophy. Left ventricular diastolic parameters are consistent with Grade I diastolic dysfunction (impaired relaxation). Right Ventricle: The right ventricular size is normal. No increase in right ventricular wall thickness. Right ventricular systolic function is normal. Tricuspid regurgitation signal is inadequate for assessing PA pressure. Left Atrium: Left atrial size was normal in size. Right Atrium: Right atrial size was normal in size. Pericardium: Trivial pericardial effusion is present. Mitral Valve: The mitral valve is normal in structure. Mild mitral annular calcification. Trivial mitral valve regurgitation. No evidence of mitral valve stenosis. Tricuspid Valve: The tricuspid valve is normal in structure. Tricuspid valve regurgitation is trivial. Aortic Valve: The aortic valve is tricuspid. Aortic valve regurgitation is not visualized. No aortic stenosis is present. Pulmonic Valve: The pulmonic valve was normal in structure. Pulmonic valve regurgitation is trivial. Aorta: The aortic root is normal in size and structure. Venous: The inferior vena cava is normal in size with greater than  50% respiratory variability,  suggesting right atrial pressure of 3 mmHg. IAS/Shunts: No atrial level shunt detected by color flow Doppler.  LEFT VENTRICLE PLAX 2D LVIDd:         4.80 cm      Diastology LVIDs:         3.30 cm      LV e' lateral:   5.27 cm/s LV PW:         0.80 cm      LV E/e' lateral: 14.7 LV IVS:        0.80 cm      LV e' medial:    3.41 cm/s LVOT diam:     1.80 cm      LV E/e' medial:  22.8 LV SV:         65 LV SV Index:   41 LVOT Area:     2.54 cm  LV Volumes (MOD) LV vol d, MOD A2C: 118.0 ml LV vol d, MOD A4C: 98.6 ml LV vol s, MOD A2C: 59.7 ml LV vol s, MOD A4C: 53.4 ml LV SV MOD A2C:     58.3 ml LV SV MOD A4C:     98.6 ml LV SV MOD BP:      54.1 ml RIGHT VENTRICLE RV S prime:     6.16 cm/s TAPSE (M-mode): 1.3 cm LEFT ATRIUM             Index       RIGHT ATRIUM           Index LA diam:        3.50 cm 2.21 cm/m  RA Area:     12.10 cm LA Vol (A2C):   38.4 ml 24.27 ml/m RA Volume:   25.40 ml  16.05 ml/m LA Vol (A4C):   43.7 ml 27.62 ml/m LA Biplane Vol: 43.3 ml 27.37 ml/m  AORTIC VALVE LVOT Vmax:   125.00 cm/s LVOT Vmean:  78.800 cm/s LVOT VTI:    0.257 m  AORTA Ao Root diam: 2.70 cm MITRAL VALVE MV Area (PHT): 1.98 cm     SHUNTS MV Decel Time: 384 msec     Systemic VTI:  0.26 m MV E velocity: 77.60 cm/s   Systemic Diam: 1.80 cm MV A velocity: 103.00 cm/s MV E/A ratio:  0.75 Loralie Champagne MD Electronically signed by Loralie Champagne MD Signature Date/Time: 09/12/2019/4:36:08 PM    Final     Microbiology: Recent Results (from the past 240 hour(s))  SARS Coronavirus 2 by RT PCR (hospital order, performed in [place] hospital lab) Nasopharyngeal Nasopharyngeal Swab     Status: None   Collection Time: 09/10/19  7:05 PM   Specimen: Nasopharyngeal Swab  Result Value Ref Range Status   SARS Coronavirus 2 NEGATIVE NEGATIVE Final    Comment: (NOTE) SARS-CoV-2 target nucleic acids are NOT DETECTED.  The SARS-CoV-2 RNA is generally detectable in upper and lower respiratory specimens during the acute phase of  infection. The lowest concentration of SARS-CoV-2 viral copies this assay can detect is 250 copies / mL. A negative result does not preclude SARS-CoV-2 infection and should not be used as the sole basis for treatment or other patient management decisions.  A negative result may occur with improper specimen collection / handling, submission of specimen other than nasopharyngeal swab, presence of viral mutation(s) within the areas targeted by this assay, and inadequate number of viral copies (<250 copies / mL). A negative result must be combined with clinical observations, patient history,  and epidemiological information.  Fact Sheet for Patients:   StrictlyIdeas.no  Fact Sheet for Healthcare Providers: BankingDealers.co.za  This test is not yet approved or  cleared by the [place] FDA and has been authorized for detection and/or diagnosis of SARS-CoV-2 by FDA under an Emergency Use Authorization (EUA).  This EUA will remain in effect (meaning this test can be used) for the duration of the COVID-19 declaration under Section 564(b)(1) of the Act, 21 U.S.C. section 360bbb-3(b)(1), unless the authorization is terminated or revoked sooner.  Performed at [facility], [place] [address], [place], [place] [address]      Labs: Basic Metabolic Panel: Recent Labs  Lab 09/10/19 1305 09/10/19 1646 09/11/19 0452 09/11/19 1344 09/12/19 0358 09/13/19 0417  NA 139  --  140  --  140 138  K 3.3*  --  3.6  --  3.9 4.3  CL 106  --  105  --  101 100  CO2 22  --  28  --  29 30  GLUCOSE 98  --  86  --  103* 115*  BUN 12  --  7*  --  11 13  CREATININE 0.66  --  0.69  --  0.91 0.99  CALCIUM 8.6*  --  8.3*  --  8.5* 8.6*  MG  --  1.6*  --  1.6* 2.0  --    Liver Function Tests: Recent Labs  Lab 09/10/19 1305  AST 12*  ALT 11  ALKPHOS 72  BILITOT 0.8  PROT 6.3*  ALBUMIN 2.9*   No results for input(s): LIPASE, AMYLASE in the last 168  hours. No results for input(s): AMMONIA in the last 168 hours. CBC: Recent Labs  Lab 09/10/19 1305 09/11/19 0452 09/13/19 0417  WBC 3.6* 3.5* 5.1  NEUTROABS 2.7 2.3  --   HGB 8.9* 8.1* 9.9*  HCT 30.9* 27.4* 33.1*  MCV 99.0 96.5 95.1  PLT 176 189 240   Cardiac Enzymes: No results for input(s): CKTOTAL, CKMB, CKMBINDEX, TROPONINI in the last 168 hours. BNP: BNP (last 3 results) Recent Labs    06/19/19 1239 09/10/19 1305  BNP 366.7* 2,476.7*        Signed:  Oswald Hillock MD.  [facility] 09/13/2019, 10:02 AM

## 2019-09-13 NOTE — Discharge Instructions (Addendum)
Heart Failure Action Plan A heart failure action plan helps you understand what to do when you have symptoms of heart failure. Follow the plan that was created by you and your health care provider. Review your plan each time you visit your health care provider. Red zone These signs and symptoms mean you should get medical help right away:  You have trouble breathing when resting.  You have a dry cough that is getting worse.  You have swelling or pain in your legs or abdomen that is getting worse.  You suddenly gain more than 2-3 lb (0.9-1.4 kg) in a day, or more than 5 lb (2.3 kg) in one week. This amount may be more or less depending on your condition.  You have trouble staying awake or you feel confused.  You have chest pain.  You do not have an appetite.  You pass out. If you experience any of these symptoms:  Call your local emergency services (911 in the [place] right away or seek help at the emergency department of the nearest hospital. Yellow zone These signs and symptoms mean your condition may be getting worse and you should make some changes:  You have trouble breathing when you are active or you need to sleep with extra pillows.  You have swelling in your legs or abdomen.  You gain 2-3 lb (0.9-1.4 kg) in one day, or 5 lb (2.3 kg) in one week. This amount may be more or less depending on your condition.  You get tired easily.  You have trouble sleeping.  You have a dry cough. If you experience any of these symptoms:  Contact your health care provider within the next day.  Your health care provider may adjust your medicines. Green zone These signs mean you are doing well and can continue what you are doing:  You do not have shortness of breath.  You have very little swelling or no new swelling.  Your weight is stable (no gain or loss).  You have a normal activity level.  You do not have chest pain or any other new symptoms. Follow these instructions at  home:  Take over-the-counter and prescription medicines only as told by your health care provider.  Weigh yourself daily. Your target weight is __________ lb (__________ kg). ? Call your health care provider if you gain more than __________ lb (__________ kg) in a day, or more than __________ lb (__________ kg) in one week.  Eat a heart-healthy diet. Work with a diet and nutrition specialist (dietitian) to create an eating plan that is best for you.  Keep all follow-up visits as told by your health care provider. This is important. Where to find more information  American Heart Association: www.heart.org Summary  Follow the action plan that was created by you and your health care provider.  Get help right away if you have any symptoms in the Red zone. This information is not intended to replace advice given to you by your health care provider. Make sure you discuss any questions you have with your health care provider. Document Revised: 02/03/2017 Document Reviewed: 04/02/2016 Elsevier Patient Education  [place] Pneumonia, Adult Pneumonia is an infection of the lungs. It causes swelling in the airways of the lungs. Mucus and fluid may also build up inside the airways. One type of pneumonia can happen while a person is in a hospital. A different type can happen when a person is not in a hospital (community-acquired pneumonia).  What  are the causes?  This condition is caused by germs (viruses, bacteria, or fungi). Some types of germs can be passed from one person to another. This can happen when you breathe in droplets from the cough or sneeze of an infected person. What increases the risk? You are more likely to develop this condition if you:  Have a long-term (chronic) disease, such as: ? Chronic obstructive pulmonary disease (COPD). ? Asthma. ? Cystic fibrosis. ? Congestive heart failure. ? Diabetes. ? Kidney disease.  Have HIV.  Have sickle  cell disease.  Have had your spleen removed.  Do not take good care of your teeth and mouth (poor dental hygiene).  Have a medical condition that increases the risk of breathing in droplets from your own mouth and nose.  Have a weakened body defense system (immune system).  Are a smoker.  Travel to areas where the germs that cause this illness are common.  Are around certain animals or the places they live. What are the signs or symptoms?  A dry cough.  A wet (productive) cough.  Fever.  Sweating.  Chest pain. This often happens when breathing deeply or coughing.  Fast breathing or trouble breathing.  Shortness of breath.  Shaking chills.  Feeling tired (fatigue).  Muscle aches. How is this treated? Treatment for this condition depends on many things. Most adults can be treated at home. In some cases, treatment must happen in a hospital. Treatment may include:  Medicines given by mouth or through an IV tube.  Being given extra oxygen.  Respiratory therapy. In rare cases, treatment for very bad pneumonia may include:  Using a machine to help you breathe.  Having a procedure to remove fluid from around your lungs. Follow these instructions at home: Medicines  Take over-the-counter and prescription medicines only as told by your doctor. ? Only take cough medicine if you are losing sleep.  If you were prescribed an antibiotic medicine, take it as told by your doctor. Do not stop taking the antibiotic even if you start to feel better. General instructions   Sleep with your head and neck raised (elevated). You can do this by sleeping in a recliner or by putting a few pillows under your head.  Rest as needed. Get at least 8 hours of sleep each night.  Drink enough water to keep your pee (urine) pale yellow.  Eat a healthy diet that includes plenty of vegetables, fruits, whole grains, low-fat dairy products, and lean protein.  Do not use any products that  contain nicotine or tobacco. These include cigarettes, e-cigarettes, and chewing tobacco. If you need help quitting, ask your doctor.  Keep all follow-up visits as told by your doctor. This is important. How is this prevented? A shot (vaccine) can help prevent pneumonia. Shots are often suggested for:  People older than 68 years of age.  People older than 68 years of age who: ? Are having cancer treatment. ? Have long-term (chronic) lung disease. ? Have problems with their body's defense system. You may also prevent pneumonia if you take these actions:  Get the flu (influenza) shot every year.  Go to the dentist as often as told.  Wash your hands often. If you cannot use soap and water, use hand sanitizer. Contact a doctor if:  You have a fever.  You lose sleep because your cough medicine does not help. Get help right away if:  You are short of breath and it gets worse.  You have more chest  pain.  Your sickness gets worse. This is very serious if: ? You are an older adult. ? Your body's defense system is weak.  You cough up blood. Summary  Pneumonia is an infection of the lungs.  Most adults can be treated at home. Some will need treatment in a hospital.  Drink enough water to keep your pee pale yellow.  Get at least 8 hours of sleep each night. This information is not intended to replace advice given to you by your health care provider. Make sure you discuss any questions you have with your health care provider. Document Revised: 06/13/2018 Document Reviewed: 10/19/2017 Elsevier Patient Education  [place].

## 2019-09-13 NOTE — Progress Notes (Addendum)
Patient ID: Wendy [name], female   DOB: Apr 16, 1950, 69 y.o.   MRN: [id number]     Advanced Heart Failure Rounding Note  PCP-Cardiologist: [name], MD   Subjective:    Denies SOB. Denies chest pain.    Objective:   Weight Range: 59.6 kg Body mass index is 24.83 kg/m.   Vital Signs:   Temp:  [98 F (36.7 C)-98.6 F (37 C)] 98 F (36.7 C) (07/09 0355) Pulse Rate:  [58-66] 65 (07/09 0008) Resp:  [17-20] 20 (07/09 0355) BP: (107-128)/(52-57) 107/57 (07/09 0355) SpO2:  [90 %-99 %] 90 % (07/09 0355) Weight:  [59.6 kg] 59.6 kg (07/09 0351) Last BM Date: 09/10/19  Weight change: Filed Weights   09/12/19 0500 09/13/19 0351  Weight: 59.8 kg 59.6 kg    Intake/Output:   Intake/Output Summary (Last 24 hours) at 09/13/2019 0801 Last data filed at 09/13/2019 0500 Gross per 24 hour  Intake 714.45 ml  Output --  Net 714.45 ml      Physical Exam    General:  No resp difficulty HEENT: normal Neck: supple. no JVD. Carotids 2+ bilat; no bruits. No lymphadenopathy or thryomegaly appreciated. Cor: PMI nondisplaced. Regular rate & rhythm. No rubs, gallops or murmurs. Lungs: clear Abdomen: soft, nontender, nondistended. No hepatosplenomegaly. No bruits or masses. Good bowel sounds. Extremities: no cyanosis, clubbing, rash, edema Neuro: alert & orientedx3, cranial nerves grossly intact. moves all 4 extremities w/o difficulty. Affect pleasant   Telemetry   NSR 60s   Labs    CBC Recent Labs    09/10/19 1305 09/10/19 1305 09/11/19 0452 09/13/19 0417  WBC 3.6*   < > 3.5* 5.1  NEUTROABS 2.7  --  2.3  --   HGB 8.9*   < > 8.1* 9.9*  HCT 30.9*   < > 27.4* 33.1*  MCV 99.0   < > 96.5 95.1  PLT 176   < > 189 240   < > = values in this interval not displayed.   Basic Metabolic Panel Recent Labs    09/11/19 0452 09/11/19 1344 09/12/19 0358 09/13/19 0417  NA   < >  --  140 138  K   < >  --  3.9 4.3  CL   < >  --  101 100  CO2   < >  --  29 30  GLUCOSE   < >  --   103* 115*  BUN   < >  --  11 13  CREATININE   < >  --  0.91 0.99  CALCIUM   < >  --  8.5* 8.6*  MG  --  1.6* 2.0  --    < > = values in this interval not displayed.   Liver Function Tests Recent Labs    09/10/19 1305  AST 12*  ALT 11  ALKPHOS 72  BILITOT 0.8  PROT 6.3*  ALBUMIN 2.9*   No results for input(s): LIPASE, AMYLASE in the last 72 hours. Cardiac Enzymes No results for input(s): CKTOTAL, CKMB, CKMBINDEX, TROPONINI in the last 72 hours.  BNP: BNP (last 3 results) Recent Labs    06/19/19 1239 09/10/19 1305  BNP 366.7* 2,476.7*    ProBNP (last 3 results) No results for input(s): PROBNP in the last 8760 hours.   D-Dimer No results for input(s): DDIMER in the last 72 hours. Hemoglobin A1C No results for input(s): HGBA1C in the last 72 hours. Fasting Lipid Panel No results for input(s): CHOL, HDL, LDLCALC, TRIG,  CHOLHDL, LDLDIRECT in the last 72 hours. Thyroid Function Tests No results for input(s): TSH, T4TOTAL, T3FREE, THYROIDAB in the last 72 hours.  Invalid input(s): FREET3  Other results:   Imaging    ECHOCARDIOGRAM COMPLETE  Result Date: 09/12/2019    ECHOCARDIOGRAM REPORT   Patient Name:   RANESSA KOSTA Date of Exam: 09/12/2019 Medical Rec #:  [id number]        Height:       61.0 in Accession #:    [id number]       Weight:       131.8 lb Date of Birth:  1950/10/01        BSA:          1.582 m Patient Age:    66 years         BP:           128/51 mmHg Patient Gender: F                HR:           59 bpm. Exam Location:  Inpatient Procedure: 2D Echo, Cardiac Doppler and Color Doppler Indications:    CHF-Acute Systolic 025.42 / H06.23  History:        Patient has prior history of Echocardiogram examinations, most                 recent 06/26/2019. CHF, CAD, Prior CABG, COPD,                 Arrythmias:non-specific ST changes, Signs/Symptoms:Chest Pain;                 Risk Factors:Sleep Apnea, Dyslipidemia, Hypertension and Former                 Smoker.  GERD. PAD.  Sonographer:    Vickie Epley RDCS Referring Phys: [id number] DANIEL R BENSIMHON IMPRESSIONS  1. Left ventricular ejection fraction, by estimation, is 45 to 50%. The left ventricle has mildly decreased function. The left ventricle demonstrates regional wall motion abnormalities with basal inferior hypokinesis and inferoseptal hypokinesis. Left ventricular diastolic parameters are consistent with Grade I diastolic dysfunction (impaired relaxation).  2. Right ventricular systolic function is normal. The right ventricular size is normal. Tricuspid regurgitation signal is inadequate for assessing PA pressure.  3. The mitral valve is normal in structure. Trivial mitral valve regurgitation. No evidence of mitral stenosis.  4. The aortic valve is tricuspid. Aortic valve regurgitation is not visualized. No aortic stenosis is present.  5. The inferior vena cava is normal in size with greater than 50% respiratory variability, suggesting right atrial pressure of 3 mmHg. FINDINGS  Left Ventricle: Left ventricular ejection fraction, by estimation, is 45 to 50%. The left ventricle has mildly decreased function. The left ventricle demonstrates regional wall motion abnormalities. The left ventricular internal cavity size was normal in size. There is no left ventricular hypertrophy. Left ventricular diastolic parameters are consistent with Grade I diastolic dysfunction (impaired relaxation). Right Ventricle: The right ventricular size is normal. No increase in right ventricular wall thickness. Right ventricular systolic function is normal. Tricuspid regurgitation signal is inadequate for assessing PA pressure. Left Atrium: Left atrial size was normal in size. Right Atrium: Right atrial size was normal in size. Pericardium: Trivial pericardial effusion is present. Mitral Valve: The mitral valve is normal in structure. Mild mitral annular calcification. Trivial mitral valve regurgitation. No evidence of mitral valve stenosis.  Tricuspid Valve: The tricuspid valve is normal in structure. Tricuspid  valve regurgitation is trivial. Aortic Valve: The aortic valve is tricuspid. Aortic valve regurgitation is not visualized. No aortic stenosis is present. Pulmonic Valve: The pulmonic valve was normal in structure. Pulmonic valve regurgitation is trivial. Aorta: The aortic root is normal in size and structure. Venous: The inferior vena cava is normal in size with greater than 50% respiratory variability, suggesting right atrial pressure of 3 mmHg. IAS/Shunts: No atrial level shunt detected by color flow Doppler.  LEFT VENTRICLE PLAX 2D LVIDd:         4.80 cm      Diastology LVIDs:         3.30 cm      LV e' lateral:   5.27 cm/s LV PW:         0.80 cm      LV E/e' lateral: 14.7 LV IVS:        0.80 cm      LV e' medial:    3.41 cm/s LVOT diam:     1.80 cm      LV E/e' medial:  22.8 LV SV:         65 LV SV Index:   41 LVOT Area:     2.54 cm  LV Volumes (MOD) LV vol d, MOD A2C: 118.0 ml LV vol d, MOD A4C: 98.6 ml LV vol s, MOD A2C: 59.7 ml LV vol s, MOD A4C: 53.4 ml LV SV MOD A2C:     58.3 ml LV SV MOD A4C:     98.6 ml LV SV MOD BP:      54.1 ml RIGHT VENTRICLE RV S prime:     6.16 cm/s TAPSE (M-mode): 1.3 cm LEFT ATRIUM             Index       RIGHT ATRIUM           Index LA diam:        3.50 cm 2.21 cm/m  RA Area:     12.10 cm LA Vol (A2C):   38.4 ml 24.27 ml/m RA Volume:   25.40 ml  16.05 ml/m LA Vol (A4C):   43.7 ml 27.62 ml/m LA Biplane Vol: 43.3 ml 27.37 ml/m  AORTIC VALVE LVOT Vmax:   125.00 cm/s LVOT Vmean:  78.800 cm/s LVOT VTI:    0.257 m  AORTA Ao Root diam: 2.70 cm MITRAL VALVE MV Area (PHT): 1.98 cm     SHUNTS MV Decel Time: 384 msec     Systemic VTI:  0.26 m MV E velocity: 77.60 cm/s   Systemic Diam: 1.80 cm MV A velocity: 103.00 cm/s MV E/A ratio:  0.75 Loralie Champagne MD Electronically signed by Loralie Champagne MD Signature Date/Time: 09/12/2019/4:36:08 PM    Final      Medications:     Scheduled Medications: . amiodarone   100 mg Oral Daily  . apixaban  5 mg Oral BID  . aspirin  81 mg Oral Daily  . atorvastatin  80 mg Oral q1800  . buPROPion  150 mg Oral Daily  . ezetimibe  10 mg Oral Daily  . ferrous sulfate  325 mg Oral BID WC  . guaiFENesin  600 mg Oral BID  . oxyCODONE  10 mg Oral TID  . pramipexole  0.25 mg Oral QHS  . sacubitril-valsartan  1 tablet Oral BID  . sodium chloride flush  3 mL Intravenous Q12H  . spironolactone  12.5 mg Oral Daily    Infusions: . sodium chloride 250 mL (  09/11/19 1426)  . cefTRIAXone (ROCEPHIN)  IV Stopped (09/12/19 1500)    PRN Medications: sodium chloride, acetaminophen, ALPRAZolam, diclofenac Sodium, ipratropium-albuterol, sodium chloride flush   Assessment/Plan   1. Acute on chronic systolic CHF: Ischemic cardiomyopathy.  Echo in 4/21 with EF 20-25%.  She has diuresed well and is feeling better.  Mild volume overload on exam.  - Repeat echo this admission, EF 45-50%. Out of range for ICD.  -Volume status improved.  - Continue  Entresto 24/26 bid.  - Continue  spironolactone 12.5 daily.  - Eventual beta blocker but keep eye on HR (50s-60s).  2. CAD: s/p CABG. Last cath in 5/21 with occluded grafts, occluded RCA, OM1 and distal LCx; 60% LAD stenosis.  No interventional target. No chest pain.  - Can stop Plavix as she is on Eliquis.  - Continue atorvastatin 80 mg daily.  3. PAD: S/p aortobifemoral bypass in 4/21.  4. Atrial fibrillation: Paroxysmal.  Remains in NSR.  - Eliquis 5 mg bid.  - Continue amiodarone 100 mg daily.  5. Possible PNA: LUL PNA on CXR.  - She is on ceftriaxone.   Home HF Meds Entresto 24-26 mg twice a day  Spironolactone 25 mg daily Lasix 20 mg po daily start 7/10  Atorvastatin 80 mg daily  Amiodarone 100 mg daily  Eliquis 5 mg twice a day.  ASA 81 mg daily.   We will set up HF follow up   Length of Stay: 3  Amy Clegg, NP  09/13/2019, 8:01 AM  Advanced Heart Failure Team Pager [phone] (M-F; 7a - 4p)  Please contact [facility] for night-coverage after hours (4p -7a ) and weekends on [email or web address]  Patient seen with NP, agree with the above note.   Echo showed EF up to 45-50%, improved.  She feels good today, no dyspnea.  Wants to go home.   On exam, no JVD or edema.   I think she can go home today on the above regimen of medications. Will try to start at least low dose beta blocker as outpatient.   BP is stable on current regimen.   Can stop Plavix with Eliquis use. Will need to check with [facility] about how long post-vascular surgery she needs ASA.   Loralie Champagne 09/13/2019 8:23 AM

## 2019-09-13 NOTE — Care Management Important Message (Signed)
Important Message  Patient Details  Name: Wendy [name] MRN: [id number] Date of Birth: 05-24-1950   Medicare Important Message Given:  Yes     Shelda Altes 09/13/2019, 8:34 AM

## 2019-09-13 NOTE — TOC Initial Note (Signed)
Transition of Care [facility]) - Initial/Assessment Note    Patient Details  Name: Wendy [name] MRN: [id number] Date of Birth: Aug 01, 1950  Transition of Care [facility]) CM/SW Contact:    Bethena Roys, RN Phone Number: 09/13/2019, 1:03 PM  Clinical Narrative:   Patient presented for CHF-prior to arrival from home. Plan will be for patient to return home. Case Manager called [facility] to make sure they can deliver today. Medications for delivery today are Entresto and Lasix. The antibiotic Rx has been [facility] to [facility]- they will deliver the antibiotic to the bedside. Patient has transportation home. Liaison from [facility] has enrolled the patient for services for community assistance. Patient has transportation home. No further needs from Case Manager at this time.                Expected Discharge Plan: Home/Self Care Barriers to Discharge: No Barriers Identified   Patient Goals and CMS Choice Patient states their goals for this hospitalization and ongoing recovery are:: to return home   Choice offered to / list presented to : NA  Expected Discharge Plan and Services Expected Discharge Plan: Home/Self Care In-house Referral: [facility] Discharge Planning Services: CM Consult Post Acute Care Choice: NA Living arrangements for the past 2 months: Single Family Home Expected Discharge Date: 09/13/19               DME Arranged: N/A   HH Arranged: NA HH Agency: NA   Prior Living Arrangements/Services Living arrangements for the past 2 months: Single Family Home Lives with:: Relatives Patient language and need for interpreter reviewed:: Yes Do you feel safe going back to the place where you live?: Yes      Need for Family Participation in Patient Care: Yes (Comment) Care giver support system in place?: Yes (comment)   Criminal Activity/Legal Involvement Pertinent to Current Situation/Hospitalization: No - Comment as needed  Activities of  Daily Living Home Assistive Devices/Equipment: Eyeglasses ADL Screening (condition at time of admission) Patient's cognitive ability adequate to safely complete daily activities?: Yes Is the patient deaf or have difficulty hearing?: No Does the patient have difficulty seeing, even when wearing glasses/contacts?: No Does the patient have difficulty concentrating, remembering, or making decisions?: No Patient able to express need for assistance with ADLs?: Yes Does the patient have difficulty dressing or bathing?: No Independently performs ADLs?: Yes (appropriate for developmental age) Does the patient have difficulty walking or climbing stairs?: Yes Weakness of Legs: Both Weakness of Arms/Hands: None  Permission Sought/Granted Permission sought to share information with : Case Manager, Family Supports    Emotional Assessment Appearance:: Appears stated age Attitude/Demeanor/Rapport: Engaged Affect (typically observed): Appropriate Orientation: : Oriented to Self, Oriented to Place, Oriented to  Time, Oriented to Situation Alcohol / Substance Use: Not Applicable Psych Involvement: No (comment)  Admission diagnosis:  Iron deficiency anemia, unspecified [D50.9] Acute respiratory distress [R06.03] Hypoxia [R09.02] Community acquired pneumonia, unspecified laterality [J18.9] Patient Active Problem List   Diagnosis Date Noted  . Prolonged QT interval 09/10/2019  . Acute respiratory failure with hypoxia ([place]) 09/10/2019  . Hypokalemia 09/10/2019  . Hypomagnesemia 09/10/2019  . Hematoma 07/21/2019  . Cardiogenic shock ([place])   . 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 Coronary artery disease of native artery of native heart with stable angina pectoris ([place])   . 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 Acute on chronic combined systolic and diastolic CHF (congestive heart failure) ([place]) 07/26/2016  .  Troponin level elevated 07/26/2016  . Chronic  anticoagulation 07/22/2016  . CAD S/P multiple PCIs-last one 2016 07/22/2016  . NSTEMI (non-ST elevated myocardial infarction) ([place]) 07/22/2016  . Primary osteoarthritis of right hip 06/27/2016  . 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 PVD (peripheral vascular disease) ([place])   . Essential (primary) hypertension 01/14/2015  . Coronary artery disease involving native coronary artery of native heart without angina pectoris 10/06/2014  . Acute angina ([place]) 05/12/2014  . Epigastric pain   . Chest pain with high risk of acute coronary syndrome 10/21/2013  . Acute respiratory failure ([place]) 09/20/2013  . Nonspecific abnormal results of liver function study 09/17/2013  . Cholecystitis, acute 09/15/2013  . PAD (peripheral artery disease) ([place])   . COPD (chronic obstructive pulmonary disease) ([place])   . Peripheral neuropathy   . Chronic back pain   . Sleep apnea   . Anxiety   . GERD (gastroesophageal reflux disease)   . 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 Hypertension 07/20/2013  . HLD (hyperlipidemia) 07/20/2013  . Unstable angina ([place]) 07/19/2013  . Hx of CABG 07/19/2013   PCP:  Josetta Huddle, MD Pharmacy:   [facility] - [place], [place] - [address] [place] [address] Phone: [phone] Fax: [phone]  [facility] [place] [place] [address] [place] [address] Phone: [phone] Fax: [phone]     Social Determinants of Health (SDOH) Interventions    Readmission Risk Interventions Readmission Risk Prevention Plan 09/13/2019 07/23/2019  Transportation Screening Complete Complete  Medication Review ([place]) Complete Complete  PCP or Specialist appointment within 3-5 days of discharge - Complete  HRI or [place] Complete Complete  SW Recovery Care/Counseling Consult Complete Complete  Palliative Care Screening Not  Applicable Not Avery Not Applicable Not Applicable  Some recent data might be hidden

## 2019-09-16 ENCOUNTER — Other Ambulatory Visit: Payer: Self-pay

## 2019-09-16 NOTE — Patient Outreach (Signed)
[place] [facility]) Care Management  09/16/2019  Wendy [name] 17-Jul-1950 [id number]     Transition of Care Referral  Referral Date: 09/13/2019 Referral Source: [facility] Liaison Date of Discharge: 09/13/2019 Facility: [place]: [facility] Medicare *PCP Office Does [facility]*   Outreach attempt # 1 to patient. No answer after multiple rings and unable to leave message.     Plan: RN CM will make outreach attempt to patient within 3-4 business days. RN CM will send unsuccessful outreach letter to patient.   Enzo Montgomery, RN,BSN,CCM [place] [facility] Telephonic Care Management Coordinator Direct Phone: [phone] Toll Free: [phone] Fax: [phone]

## 2019-09-17 ENCOUNTER — Other Ambulatory Visit: Payer: Self-pay

## 2019-09-17 NOTE — Patient Outreach (Signed)
[place] [facility]) Care Management  09/17/2019  ELANORE TALCOTT 10-Feb-1951 [id number]   Transition of Care Referral  Referral Date: 09/13/2019 Referral Source: [facility] Liaison Date of Discharge: 09/13/2019 Facility: [place]: [facility] Medicare *PCP Office Does [facility]*    Outreach attempt #2 to patient. A female answered the phone and reported that patient was asleep at present.     Plan: RN CM will make outreach attempt to patient within 3-4 business days.   Enzo Montgomery, RN,BSN,CCM [place] [facility] Telephonic Care Management Coordinator Direct Phone: [phone] Toll Free: [phone] Fax: [phone]

## 2019-09-19 ENCOUNTER — Other Ambulatory Visit: Payer: Self-pay

## 2019-09-19 ENCOUNTER — Emergency Department (HOSPITAL_COMMUNITY): Payer: Medicare HMO

## 2019-09-19 ENCOUNTER — Inpatient Hospital Stay (HOSPITAL_COMMUNITY)
Admission: EM | Admit: 2019-09-19 | Discharge: 2019-09-22 | DRG: 682 | Disposition: A | Discharge status: Home Health | Payer: Medicare HMO | Attending: Internal Medicine | Admitting: Internal Medicine

## 2019-09-19 DIAGNOSIS — J9601 Acute respiratory failure with hypoxia: Secondary | ICD-10-CM | POA: Diagnosis present

## 2019-09-19 DIAGNOSIS — Z87891 Personal history of nicotine dependence: Secondary | ICD-10-CM | POA: Diagnosis not present

## 2019-09-19 DIAGNOSIS — Z20822 Contact with and (suspected) exposure to covid-19: Secondary | ICD-10-CM | POA: Diagnosis present

## 2019-09-19 DIAGNOSIS — Z832 Family history of diseases of the blood and blood-forming organs and certain disorders involving the immune mechanism: Secondary | ICD-10-CM

## 2019-09-19 DIAGNOSIS — R4781 Slurred speech: Secondary | ICD-10-CM | POA: Diagnosis not present

## 2019-09-19 DIAGNOSIS — I255 Ischemic cardiomyopathy: Secondary | ICD-10-CM | POA: Diagnosis present

## 2019-09-19 DIAGNOSIS — R55 Syncope and collapse: Secondary | ICD-10-CM | POA: Diagnosis not present

## 2019-09-19 DIAGNOSIS — Z888 Allergy status to other drugs, medicaments and biological substances status: Secondary | ICD-10-CM

## 2019-09-19 DIAGNOSIS — Z801 Family history of malignant neoplasm of trachea, bronchus and lung: Secondary | ICD-10-CM

## 2019-09-19 DIAGNOSIS — N179 Acute kidney failure, unspecified: Principal | ICD-10-CM

## 2019-09-19 DIAGNOSIS — J449 Chronic obstructive pulmonary disease, unspecified: Secondary | ICD-10-CM | POA: Diagnosis present

## 2019-09-19 DIAGNOSIS — E785 Hyperlipidemia, unspecified: Secondary | ICD-10-CM | POA: Diagnosis present

## 2019-09-19 DIAGNOSIS — Z951 Presence of aortocoronary bypass graft: Secondary | ICD-10-CM | POA: Diagnosis not present

## 2019-09-19 DIAGNOSIS — Z7901 Long term (current) use of anticoagulants: Secondary | ICD-10-CM | POA: Diagnosis not present

## 2019-09-19 DIAGNOSIS — I5022 Chronic systolic (congestive) heart failure: Secondary | ICD-10-CM | POA: Diagnosis present

## 2019-09-19 DIAGNOSIS — I480 Paroxysmal atrial fibrillation: Secondary | ICD-10-CM | POA: Diagnosis present

## 2019-09-19 DIAGNOSIS — Z86718 Personal history of other venous thrombosis and embolism: Secondary | ICD-10-CM | POA: Diagnosis not present

## 2019-09-19 DIAGNOSIS — R0602 Shortness of breath: Secondary | ICD-10-CM | POA: Diagnosis not present

## 2019-09-19 DIAGNOSIS — Z885 Allergy status to narcotic agent status: Secondary | ICD-10-CM

## 2019-09-19 DIAGNOSIS — Z79891 Long term (current) use of opiate analgesic: Secondary | ICD-10-CM

## 2019-09-19 DIAGNOSIS — D509 Iron deficiency anemia, unspecified: Secondary | ICD-10-CM

## 2019-09-19 DIAGNOSIS — Z8249 Family history of ischemic heart disease and other diseases of the circulatory system: Secondary | ICD-10-CM

## 2019-09-19 DIAGNOSIS — Z79899 Other long term (current) drug therapy: Secondary | ICD-10-CM

## 2019-09-19 DIAGNOSIS — I2510 Atherosclerotic heart disease of native coronary artery without angina pectoris: Secondary | ICD-10-CM | POA: Diagnosis present

## 2019-09-19 DIAGNOSIS — G8929 Other chronic pain: Secondary | ICD-10-CM | POA: Diagnosis present

## 2019-09-19 DIAGNOSIS — I252 Old myocardial infarction: Secondary | ICD-10-CM

## 2019-09-19 DIAGNOSIS — I509 Heart failure, unspecified: Secondary | ICD-10-CM

## 2019-09-19 DIAGNOSIS — R0902 Hypoxemia: Secondary | ICD-10-CM | POA: Diagnosis not present

## 2019-09-19 DIAGNOSIS — Z96641 Presence of right artificial hip joint: Secondary | ICD-10-CM | POA: Diagnosis present

## 2019-09-19 DIAGNOSIS — I739 Peripheral vascular disease, unspecified: Secondary | ICD-10-CM | POA: Diagnosis present

## 2019-09-19 DIAGNOSIS — R296 Repeated falls: Secondary | ICD-10-CM | POA: Diagnosis present

## 2019-09-19 DIAGNOSIS — R400 Somnolence: Secondary | ICD-10-CM

## 2019-09-19 DIAGNOSIS — M549 Dorsalgia, unspecified: Secondary | ICD-10-CM | POA: Diagnosis present

## 2019-09-19 DIAGNOSIS — I959 Hypotension, unspecified: Secondary | ICD-10-CM | POA: Diagnosis not present

## 2019-09-19 DIAGNOSIS — D500 Iron deficiency anemia secondary to blood loss (chronic): Secondary | ICD-10-CM | POA: Diagnosis not present

## 2019-09-19 DIAGNOSIS — Z880 Allergy status to penicillin: Secondary | ICD-10-CM

## 2019-09-19 DIAGNOSIS — S8991XA Unspecified injury of right lower leg, initial encounter: Secondary | ICD-10-CM | POA: Diagnosis not present

## 2019-09-19 DIAGNOSIS — R911 Solitary pulmonary nodule: Secondary | ICD-10-CM | POA: Diagnosis present

## 2019-09-19 DIAGNOSIS — D649 Anemia, unspecified: Secondary | ICD-10-CM | POA: Diagnosis present

## 2019-09-19 DIAGNOSIS — I110 Hypertensive heart disease with heart failure: Secondary | ICD-10-CM | POA: Diagnosis present

## 2019-09-19 DIAGNOSIS — Z7982 Long term (current) use of aspirin: Secondary | ICD-10-CM

## 2019-09-19 DIAGNOSIS — R404 Transient alteration of awareness: Secondary | ICD-10-CM | POA: Diagnosis not present

## 2019-09-19 DIAGNOSIS — G9341 Metabolic encephalopathy: Secondary | ICD-10-CM | POA: Insufficient documentation

## 2019-09-19 DIAGNOSIS — I951 Orthostatic hypotension: Secondary | ICD-10-CM | POA: Diagnosis present

## 2019-09-19 DIAGNOSIS — Z981 Arthrodesis status: Secondary | ICD-10-CM

## 2019-09-19 DIAGNOSIS — R456 Violent behavior: Secondary | ICD-10-CM | POA: Diagnosis not present

## 2019-09-19 DIAGNOSIS — Z9889 Other specified postprocedural states: Secondary | ICD-10-CM | POA: Diagnosis not present

## 2019-09-19 DIAGNOSIS — S299XXA Unspecified injury of thorax, initial encounter: Secondary | ICD-10-CM | POA: Diagnosis not present

## 2019-09-19 DIAGNOSIS — G9340 Encephalopathy, unspecified: Secondary | ICD-10-CM | POA: Diagnosis not present

## 2019-09-19 LAB — CBC WITH DIFFERENTIAL/PLATELET
Abs Immature Granulocytes: 0.03 10*3/uL (ref 0.00–0.07)
Basophils Absolute: 0 10*3/uL (ref 0.0–0.1)
Basophils Relative: 1 %
Eosinophils Absolute: 0 10*3/uL (ref 0.0–0.5)
Eosinophils Relative: 1 %
HCT: 31.2 % — ABNORMAL LOW (ref 36.0–46.0)
Hemoglobin: 9 g/dL — ABNORMAL LOW (ref 12.0–15.0)
Immature Granulocytes: 1 %
Lymphocytes Relative: 13 %
Lymphs Abs: 0.5 10*3/uL — ABNORMAL LOW (ref 0.7–4.0)
MCH: 27.5 pg (ref 26.0–34.0)
MCHC: 28.8 g/dL — ABNORMAL LOW (ref 30.0–36.0)
MCV: 95.4 fL (ref 80.0–100.0)
Monocytes Absolute: 0.3 10*3/uL (ref 0.1–1.0)
Monocytes Relative: 6 %
Neutro Abs: 3.1 10*3/uL (ref 1.7–7.7)
Neutrophils Relative %: 79 %
Platelets: 203 10*3/uL (ref 150–400)
RBC: 3.27 MIL/uL — ABNORMAL LOW (ref 3.87–5.11)
RDW: 15.1 % (ref 11.5–15.5)
WBC: 3.9 10*3/uL — ABNORMAL LOW (ref 4.0–10.5)

## 2019-09-19 LAB — URINALYSIS, ROUTINE W REFLEX MICROSCOPIC
Bilirubin Urine: NEGATIVE
Glucose, UA: NEGATIVE mg/dL
Hgb urine dipstick: NEGATIVE
Ketones, ur: NEGATIVE mg/dL
Nitrite: NEGATIVE
Protein, ur: NEGATIVE mg/dL
Specific Gravity, Urine: 1.013 (ref 1.005–1.030)
pH: 5 (ref 5.0–8.0)

## 2019-09-19 LAB — COMPREHENSIVE METABOLIC PANEL
ALT: 15 U/L (ref 0–44)
AST: 25 U/L (ref 15–41)
Albumin: 2.5 g/dL — ABNORMAL LOW (ref 3.5–5.0)
Alkaline Phosphatase: 65 U/L (ref 38–126)
Anion gap: 11 (ref 5–15)
BUN: 37 mg/dL — ABNORMAL HIGH (ref 8–23)
CO2: 26 mmol/L (ref 22–32)
Calcium: 8.2 mg/dL — ABNORMAL LOW (ref 8.9–10.3)
Chloride: 102 mmol/L (ref 98–111)
Creatinine, Ser: 1.96 mg/dL — ABNORMAL HIGH (ref 0.44–1.00)
GFR calc Af Amer: 30 mL/min — ABNORMAL LOW (ref >60)
GFR calc non Af Amer: 26 mL/min — ABNORMAL LOW (ref >60)
Glucose, Bld: 87 mg/dL (ref 70–99)
Potassium: 4.7 mmol/L (ref 3.5–5.1)
Sodium: 139 mmol/L (ref 135–145)
Total Bilirubin: 0.6 mg/dL (ref 0.3–1.2)
Total Protein: 5.6 g/dL — ABNORMAL LOW (ref 6.5–8.1)

## 2019-09-19 LAB — I-STAT VENOUS BLOOD GAS, ED
Acid-base deficit: 2 mmol/L (ref 0.0–2.0)
Bicarbonate: 25.6 mmol/L (ref 20.0–28.0)
Calcium, Ion: 1.12 mmol/L — ABNORMAL LOW (ref 1.15–1.40)
HCT: 29 % — ABNORMAL LOW (ref 36.0–46.0)
Hemoglobin: 9.9 g/dL — ABNORMAL LOW (ref 12.0–15.0)
O2 Saturation: 72 %
Potassium: 4.7 mmol/L (ref 3.5–5.1)
Sodium: 141 mmol/L (ref 135–145)
TCO2: 27 mmol/L (ref 22–32)
pCO2, Ven: 56 mmHg (ref 44.0–60.0)
pH, Ven: 7.269 (ref 7.250–7.430)
pO2, Ven: 44 mmHg (ref 32.0–45.0)

## 2019-09-19 LAB — RAPID URINE DRUG SCREEN, HOSP PERFORMED
Amphetamines: NOT DETECTED
Barbiturates: NOT DETECTED
Benzodiazepines: POSITIVE — AB
Cocaine: NOT DETECTED
Opiates: POSITIVE — AB
Tetrahydrocannabinol: NOT DETECTED

## 2019-09-19 LAB — LACTIC ACID, PLASMA: Lactic Acid, Venous: 1.4 mmol/L (ref 0.5–1.9)

## 2019-09-19 LAB — CK: Total CK: 69 U/L (ref 38–234)

## 2019-09-19 LAB — TROPONIN I (HIGH SENSITIVITY)
Troponin I (High Sensitivity): 16 ng/L (ref <18)
Troponin I (High Sensitivity): 14 ng/L (ref <18)

## 2019-09-19 LAB — SARS CORONAVIRUS 2 BY RT PCR (HOSPITAL ORDER, PERFORMED IN ~~LOC~~ HOSPITAL LAB): SARS Coronavirus 2: NEGATIVE

## 2019-09-19 LAB — BRAIN NATRIURETIC PEPTIDE: B Natriuretic Peptide: 200.9 pg/mL — ABNORMAL HIGH (ref 0.0–100.0)

## 2019-09-19 LAB — MRSA PCR SCREENING: MRSA by PCR: NEGATIVE

## 2019-09-19 LAB — CBG MONITORING, ED: Glucose-Capillary: 81 mg/dL (ref 70–99)

## 2019-09-19 MED ORDER — SODIUM CHLORIDE 0.9 % IV SOLN: INTRAVENOUS | Status: DC

## 2019-09-19 MED ORDER — ALBUTEROL SULFATE (2.5 MG/3ML) 0.083% IN NEBU: 3.0000 mL | INHALATION_SOLUTION | Freq: Four times a day (QID) | RESPIRATORY_TRACT | Status: DC | PRN

## 2019-09-19 MED ORDER — ATORVASTATIN CALCIUM 80 MG PO TABS
80.0000 mg | ORAL_TABLET | Freq: Every day | ORAL | Status: DC
  Administered 2019-09-19 – 2019-09-21 (×3): 80 mg via ORAL
  Filled 2019-09-19 (×4): qty 1

## 2019-09-19 MED ORDER — FERROUS SULFATE 325 (65 FE) MG PO TABS
325.0000 mg | ORAL_TABLET | Freq: Two times a day (BID) | ORAL | Status: DC
  Administered 2019-09-20 – 2019-09-22 (×5): 325 mg via ORAL
  Filled 2019-09-19 (×5): qty 1

## 2019-09-19 MED ORDER — RANOLAZINE ER 500 MG PO TB12: 1000.0000 mg | ORAL_TABLET | Freq: Two times a day (BID) | ORAL | Status: DC

## 2019-09-19 MED ORDER — SERTRALINE HCL 50 MG PO TABS: 150.0000 mg | ORAL_TABLET | Freq: Every day | ORAL | Status: DC

## 2019-09-19 MED ORDER — HYDRALAZINE HCL 25 MG PO TABS: 25.0000 mg | ORAL_TABLET | Freq: Four times a day (QID) | ORAL | Status: DC | PRN

## 2019-09-19 MED ORDER — ACETAMINOPHEN 500 MG PO TABS
500.0000 mg | ORAL_TABLET | Freq: Four times a day (QID) | ORAL | Status: DC | PRN
  Administered 2019-09-21: 500 mg via ORAL
  Filled 2019-09-19: qty 1

## 2019-09-19 MED ORDER — PANTOPRAZOLE SODIUM 40 MG PO TBEC
40.0000 mg | DELAYED_RELEASE_TABLET | Freq: Every day | ORAL | Status: DC
  Administered 2019-09-20 – 2019-09-22 (×3): 40 mg via ORAL
  Filled 2019-09-19 (×3): qty 1

## 2019-09-19 MED ORDER — EZETIMIBE 10 MG PO TABS
10.0000 mg | ORAL_TABLET | Freq: Every day | ORAL | Status: DC
  Administered 2019-09-20 – 2019-09-22 (×3): 10 mg via ORAL
  Filled 2019-09-19 (×3): qty 1

## 2019-09-19 MED ORDER — PROMETHAZINE HCL 25 MG PO TABS: 12.5000 mg | ORAL_TABLET | Freq: Four times a day (QID) | ORAL | Status: DC | PRN

## 2019-09-19 MED ORDER — APIXABAN 2.5 MG PO TABS
2.5000 mg | ORAL_TABLET | Freq: Two times a day (BID) | ORAL | Status: DC
  Administered 2019-09-19 – 2019-09-20 (×2): 2.5 mg via ORAL
  Filled 2019-09-19 (×2): qty 1

## 2019-09-19 MED ORDER — DICLOFENAC SODIUM 1 % EX GEL
2.0000 g | Freq: Four times a day (QID) | CUTANEOUS | Status: DC | PRN
  Filled 2019-09-19: qty 100

## 2019-09-19 MED ORDER — SODIUM CHLORIDE 0.9 % IV BOLUS
1000.0000 mL | Freq: Once | INTRAVENOUS | Status: AC
  Administered 2019-09-19: 1000 mL via INTRAVENOUS

## 2019-09-19 MED ORDER — SODIUM CHLORIDE 0.9% FLUSH
3.0000 mL | Freq: Two times a day (BID) | INTRAVENOUS | Status: DC
  Administered 2019-09-19 – 2019-09-21 (×3): 3 mL via INTRAVENOUS

## 2019-09-19 MED ORDER — PRAMIPEXOLE DIHYDROCHLORIDE 0.25 MG PO TABS
0.2500 mg | ORAL_TABLET | Freq: Every day | ORAL | Status: DC
  Administered 2019-09-19 – 2019-09-22 (×3): 0.25 mg via ORAL
  Filled 2019-09-19 (×4): qty 1

## 2019-09-19 MED ORDER — BUPROPION HCL ER (XL) 150 MG PO TB24
150.0000 mg | ORAL_TABLET | Freq: Every day | ORAL | Status: DC
  Administered 2019-09-20 – 2019-09-22 (×3): 150 mg via ORAL
  Filled 2019-09-19 (×3): qty 1

## 2019-09-19 MED ORDER — AMIODARONE HCL 200 MG PO TABS: 100.0000 mg | ORAL_TABLET | Freq: Every day | ORAL | Status: DC

## 2019-09-19 MED ORDER — ASPIRIN 81 MG PO CHEW
81.0000 mg | CHEWABLE_TABLET | Freq: Every day | ORAL | Status: DC
  Administered 2019-09-20 – 2019-09-22 (×3): 81 mg via ORAL
  Filled 2019-09-19 (×3): qty 1

## 2019-09-19 MED ORDER — GABAPENTIN 100 MG PO CAPS
100.0000 mg | ORAL_CAPSULE | Freq: Three times a day (TID) | ORAL | Status: DC
  Administered 2019-09-19 – 2019-09-22 (×7): 100 mg via ORAL
  Filled 2019-09-19 (×7): qty 1

## 2019-09-19 MED ORDER — ALPRAZOLAM 0.25 MG PO TABS
0.5000 mg | ORAL_TABLET | Freq: Three times a day (TID) | ORAL | Status: DC | PRN
  Administered 2019-09-20 – 2019-09-21 (×2): 0.5 mg via ORAL
  Filled 2019-09-19 (×2): qty 2

## 2019-09-19 NOTE — ED Triage Notes (Signed)
Patient brought in from home via [facility] for altered mental status. Patient lives with family, last known well 2100 yesterday. [place] out of bed at 1100 this morning, was confused then but family helped her back into bed.  BP 70/30 with EMS, 80% SpO2 on room air. Provider at bedside.

## 2019-09-19 NOTE — H&P (Signed)
History and Physical    Wendy [name] TDV:[id number] DOB: Oct 15, 1950 DOA: 09/19/2019  PCP: Josetta Huddle, MD (Confirm with patient/family/NH records and if not entered, this has to be entered at [facility] point of entry) Patient coming from: Home  I have personally briefly reviewed patient's old medical records in [place]  Chief Complaint: Feel weak  HPI: Wendy [name] is a 69 y.o. female with medical history significant of ischemic cardiomyopathy, chronic systolic CHF (LVEF 40 to 06% on July 08), hypertension, hyperlipidemia, PAF, CAD s/p CABG, PVD, COPD came with near syncope, fall and altered mental status.  On last admission, patient's CHF medications were adjusted, Entresto replaced ARB and HCTZ.  Since discharge, patient frequently feel lightheaded and weak.  She said each morning after taking Entresto and other medications there was immediate blood pressure measurement and data sent to her cardiologist which showed that the blood pressures been stable.  However patient said frequently she got the feeling of lightheadedness and blurry vision after taking her morning BP/CHF medication to a point that she usually had to stay sitting in the couch for extensive period of time to let the episode pass.   This morning, after taking all BP/seizure medications she started to feel lightheaded again but she remembered that she had to reach to her dog, and just when she was standing clear the couch, she felt dizzy and collapsed.  She denied any loss of consciousness, no hitting of the head.  Patient's sister who lives with her went to talk to her and found pt very confused.EMS arrived found patient O2 sat ration the 80s and hypotensive.  She denies any chest pain, no short of breath, no UTI symptoms no diarrhea.  No GI symptoms.  No headache.   ED Course: On arrival, blood pressure 81/49, O2 saturation 88% patient was placed on BiPAP then transferred to facemask.  After giving 1 L of IV bolus  patient blood pressure improved and O2 saturation also improved 99% on room air.  Mentation wise patient still lethargic and somewhat confused.  Review of Systems: As per HPI otherwise 10 point review of systems negative.    Past Medical History:  Diagnosis Date  . Anemia   . Anginal pain ([place])    jaw pain 07/2016, s/p 07/15/16 nuclear stress test  . Anxiety   . Arthritis    "lower back" (08/24/2017)  . Asthma   . [place] Obstructive Pulmonary Disease   . Chronic back pain   . Chronic combined systolic and diastolic CHF    EF 2/69: 48-54 // EF 4/21: 55-60 // EF 4/21: 20-25  . Chronic leg pain   . Chronic lower back pain   . Coronary Artery Disease    a. s/p NSTEMI >> CABG 1998 // s/p PCI 2013 // [place] 01/2015: DES to SVG-RCA.// Cath 05/2019: all grafts occluded; mod non-obs LAD disease w L-L and L-R collats - med Rx // post Ao-Bifem bypass MI c/b PEA, CGS requiring Impella >> anatomy unchanged - Med Rx  . Depression   . Fall 08/2016  . Gastroesophageal reflux disease   . Glucose intolerance   . Headache    "a few/month" (08/24/2017)  . HLD (hyperlipidemia)   . Hx of DVT X 3   RLE  . Hypertension   . Myocardial infarction ([place]) 1999  . OSA on CPAP   . Paroxysmal atrial fibrillation    a. 09/2013 post-op b. 01/2015 // Apixaban // recurrent AFib post MI after Ao-Bifem  in 06/2019 >> Amio Rx  . Peripheral artery disease    s/p L fem-fem bypass // s/p Ao-Bifem bypass in 06/2019  . Peripheral neuropathy   . Pneumonia   . Shortness of breath    when walking  . Wears glasses     Past Surgical History:  Procedure Laterality Date  . ABDOMINAL AORTOGRAM W/LOWER EXTREMITY Bilateral 04/29/2019   Procedure: ABDOMINAL AORTOGRAM W/LOWER EXTREMITY;  Surgeon: Waynetta Sandy, MD;  Location: [place] [facility];  Service: Cardiovascular;  Laterality: Bilateral;  . ANTERIOR CERVICAL DECOMP/DISCECTOMY FUSION  1991  . AORTA - BILATERAL FEMORAL ARTERY BYPASS GRAFT N/A 06/18/2019   Procedure:  AORTA BIFEMORAL BYPASS GRAFT;  Surgeon: Waynetta Sandy, MD;  Location: [place];  Service: Vascular;  Laterality: N/A;  . ARTERY REPAIR Left 06/30/2019   Procedure: LEFT BRACHIAL ARTERY REPAIR, EVACUATION OF HEMATOMA;  Surgeon: Serafina Mitchell, MD;  Location: [place];  Service: Vascular;  Laterality: Left;  . BACK SURGERY    . CARDIAC CATHETERIZATION N/A 01/22/2015   Procedure: Left Heart Cath and Coronary Angiography;  Surgeon: Troy Sine, MD;  Location: [place] [facility];  Service: Cardiovascular;  Laterality: N/A;  . CARDIAC CATHETERIZATION N/A 01/22/2015   Procedure: Coronary Stent Intervention;  Surgeon: Troy Sine, MD;  Location: [place] [facility];  Service: Cardiovascular;  Laterality: N/A;  3.0x12 xience prox SVG to RCA  . CARDIAC CATHETERIZATION  03/09/2000   hx/notes 03/20/2000  . CHOLECYSTECTOMY N/A 09/16/2013   Procedure: LAPAROSCOPIC CHOLECYSTECTOMY CONVERTED TO OPEN CHOLECYSTECTOMY WITH CHOLANGIOGRAM;  Surgeon: [name], MD;  Location: [place];  Service: General;  Laterality: N/A;  . CORONARY ANGIOGRAPHY N/A 06/27/2019   Procedure: CORONARY ANGIOGRAPHY (CATH LAB);  Surgeon: Sherren Mocha, MD;  Location: [place];  Service: Cardiovascular;  Laterality: N/A;  . CORONARY ANGIOPLASTY WITH STENT PLACEMENT  11/2011, 02/2012, 01/2015   DES to SVG-RCA both times, In [place], [place], Dr. Bruce Donath  . CORONARY ARTERY BYPASS GRAFT  1998   LIMA-LAD, SVG-OM, SVG-RCA  . ERCP N/A 09/19/2013   Procedure: ENDOSCOPIC RETROGRADE CHOLANGIOPANCREATOGRAPHY (ERCP);  Surgeon: [name], MD;  Location: [facility];  Service: Endoscopy;  Laterality: N/A;  . FINGER SURGERY Right    ring finger "fused"  . FRACTURE SURGERY    . IRRIGATION AND DEBRIDEMENT ABSCESS Right 10/17/2013   Procedure: INCISION AND DRAINAGE RIGHT SUBCOSTAL WOUND;  Surgeon: [name], MD;  Location: [facility];  Service: General;  Laterality: Right;  . JOINT REPLACEMENT    . KNEE ARTHROSCOPY Right  1998  . LEFT HEART CATH AND CORS/GRAFTS ANGIOGRAPHY N/A 07/25/2016   Procedure: Left Heart Cath and Cors/Grafts Angiography;  Surgeon: Jettie Booze, MD;  Location: [place] [facility];  Service: Cardiovascular;  Laterality: N/A;  . LEFT HEART CATH AND CORS/GRAFTS ANGIOGRAPHY N/A 06/05/2019   Procedure: LEFT HEART CATH AND CORS/GRAFTS ANGIOGRAPHY;  Surgeon: Burnell Blanks, MD;  Location: [facility] [facility];  Service: Cardiovascular;  Laterality: N/A;  . LEFT HEART CATHETERIZATION WITH CORONARY/GRAFT ANGIOGRAM N/A 07/19/2013   Procedure: LEFT HEART CATHETERIZATION WITH Beatrix Fetters;  Surgeon: Leonie Man, MD;  Location: [facility];  Service: Cardiovascular;  Laterality: N/A;  . MASS EXCISION Right 06/25/2014   Procedure: EXCISION BUTTOCK MASS ;  Surgeon: Coralie Keens, MD;  Location: [facility];  Service: General;  Laterality: Right;  . ORIF HUMERUS FRACTURE Left 08/27/2013   Procedure: OPEN REDUCTION INTERNAL FIXATION (ORIF) LEFT HUMERUS ;  Surgeon:  Rozanna Box, MD;  Location: [facility];  Service: Orthopedics;  Laterality: Left;  . PLACEMENT OF IMPELLA LEFT VENTRICULAR ASSIST DEVICE N/A 06/24/2019   Procedure: PLACEMENT OF IMPELLA LEFT VENTRICULAR ASSIST DEVICE;  Surgeon: [place] Olds, MD;  Location: [place];  Service: Open Heart Surgery;  Laterality: N/A;  . [place]  . REMOVAL OF IMPELLA LEFT VENTRICULAR ASSIST DEVICE Right 06/28/2019   Procedure: REMOVAL OF IMPELLA LEFT VENTRICULAR ASSIST DEVICE;  Surgeon: [place] Olds, MD;  Location: [facility];  Service: Open Heart Surgery;  Laterality: Right;  . THROMBECTOMY / EMBOLECTOMY FEMORAL ARTERY Right    hx/notes 03/20/2000  . TOTAL HIP ARTHROPLASTY Right 07/19/2016   Procedure: TOTAL HIP ARTHROPLASTY ANTERIOR APPROACH;  Surgeon: [name], MD;  Location: [facility];  Service: Orthopedics;  Laterality: Right;  . TOTAL HIP REVISION Right 08/22/2017   Procedure: TOTAL HIP  REVISION;  Surgeon: [name], MD;  Location: [place];  Service: Orthopedics;  Laterality: Right;     reports that she quit smoking about 21 years ago. Her smoking use included cigarettes. She has a 17.00 pack-year smoking history. She has never used smokeless tobacco. She reports previous alcohol use. She reports current drug use. Drug: Marijuana.  Allergies  Allergen Reactions  . Benadryl [Diphenhydramine] Shortness Of Breath  . Penicillins Diarrhea    Did it involve swelling of the face/tongue/throat, SOB, or low BP? No Did it involve sudden or severe rash/hives, skin peeling, or any reaction on the inside of your mouth or nose? No Did you need to seek medical attention at a hospital or doctor's office? No When did it last happen?2 months If all above answers are "NO", may proceed with cephalosporin use.  . Adhesive [Tape] Other (See Comments)    Burning of skin  . Amoxicillin Diarrhea and Other (See Comments)    Has patient had a PCN reaction causing immediate rash, facial/tongue/throat swelling, SOB or lightheadedness with hypotension: No Has patient had a PCN reaction causing severe rash involving mucus membranes or skin necrosis: No Has patient had a PCN reaction that required hospitalization No Has patient had a PCN reaction occurring within the last 10 years: Yes -- noted rxn as diarrhea If all of the above answers are "NO", then may proceed with Cephalosporin use.   . Cyclobenzaprine Other (See Comments)    Causes restless leg syndrome Restless syndrome  . Hydrocodone Itching    Family History  Problem Relation Age of Onset  . Lung cancer Mother   . Clotting disorder Father   . Lung cancer Sister   . Heart disease Brother   . Heart disease Brother   . Colon cancer Neg Hx   . Breast cancer Neg Hx      Prior to Admission medications   Medication Sig Start Date End Date Taking? Authorizing Provider  ALPRAZolam Duanne Moron) 0.5 MG tablet Take 0.5 mg by mouth 3  (three) times daily as needed for anxiety.   Yes [provider]  acetaminophen (TYLENOL) 500 MG tablet Take 500-1,000 mg by mouth every 6 (six) hours as needed (for pain.).    [provider]  albuterol (PROVENTIL HFA;VENTOLIN HFA) 108 (90 Base) MCG/ACT inhaler Inhale 2 puffs into the lungs every 6 (six) hours as needed for wheezing or shortness of breath. 01/01/18   Leanor Kail, PA  amiodarone (PACERONE) 100 MG tablet Take 1 tablet (100 mg total) by mouth daily. 08/15/19   Nahser, Wonda Cheng, MD  apixaban (ELIQUIS) 5 MG TABS tablet Take 1 tablet (5 mg total) by mouth 2 (two) times daily. 11/20/18   Nahser, Wonda Cheng, MD  aspirin 81 MG chewable tablet Chew 1 tablet (81 mg total) by mouth daily. 07/10/19   Dagoberto Ligas, PA-C  atorvastatin (LIPITOR) 80 MG tablet TAKE ONE TABLET BY MOUTH DAILY AT 6PM Patient taking differently: Take 80 mg by mouth daily at 6 PM. TAKE ONE TABLET BY MOUTH DAILY AT [facility] 11/20/18   Nahser, Wonda Cheng, MD  buPROPion (WELLBUTRIN XL) 150 MG 24 hr tablet Take 150 mg by mouth daily.    [provider]  diclofenac Sodium (VOLTAREN) 1 % GEL Apply 1 g topically 4 (four) times daily as needed for pain. 02/15/19   [provider]  ezetimibe (ZETIA) 10 MG tablet Take 10 mg by mouth daily.    [provider]  furosemide (LASIX) 20 MG tablet Take 1 tablet (20 mg total) by mouth daily. 09/13/19   Oswald Hillock, MD  gabapentin (NEURONTIN) 300 MG capsule Take 300 mg by mouth 3 (three) times daily as needed for pain. 02/15/19   [provider]  nitroGLYCERIN (NITROSTAT) 0.4 MG SL tablet Place 1 tablet (0.4 mg total) under the tongue every 5 (five) minutes as needed for chest pain. 11/20/18   Nahser, Wonda Cheng, MD  oxyCODONE 10 MG TABS Take 1 tablet (10 mg total) by mouth 3 (three) times daily. 07/12/19   Ulyses Amor, PA-C  pantoprazole (PROTONIX) 40 MG tablet Take 1 tablet (40 mg total) by mouth daily. 11/20/18   Nahser, Wonda Cheng, MD    pramipexole (MIRAPEX) 0.25 MG tablet Take 0.25 mg by mouth at bedtime.  02/18/19   [provider]  promethazine (PHENERGAN) 12.5 MG tablet Take 1 tablet (12.5 mg total) by mouth every 6 (six) hours as needed for nausea or vomiting. 08/02/19   Waynetta Sandy, MD  ranolazine (RANEXA) 1000 MG SR tablet Take 1,000 mg by mouth 2 (two) times daily.    [provider]  sacubitril-valsartan (ENTRESTO) 24-26 MG Take 1 tablet by mouth 2 (two) times daily. 09/13/19   Oswald Hillock, MD  sertraline (ZOLOFT) 100 MG tablet Take 150 mg by mouth at bedtime.     [provider]  spironolactone (ALDACTONE) 25 MG tablet Take 1 tablet (25 mg total) by mouth daily. 07/13/19   Ulyses Amor, PA-C    Physical Exam: Vitals:   09/19/19 1634 09/19/19 1645 09/19/19 1700 09/19/19 1741  BP:  107/73 134/84 (!) 119/52  Pulse: 62 70    Resp: 18 (!) 21 15 17   Temp:      TempSrc:      SpO2: 100% (!) 86%      Constitutional: NAD, calm, comfortable Vitals:   09/19/19 1634 09/19/19 1645 09/19/19 1700 09/19/19 1741  BP:  107/73 134/84 (!) 119/52  Pulse: 62 70    Resp: 18 (!) 21 15 17   Temp:      TempSrc:      SpO2: 100% (!) 86%     Eyes: PERRL, lids and conjunctivae normal ENMT: Mucous membranes are moist. Posterior pharynx clear of any exudate or lesions.Normal dentition.  Neck: normal, supple, no masses, no thyromegaly Respiratory: clear to auscultation bilaterally, no wheezing, no crackles. Normal respiratory effort. No accessory muscle use.  Cardiovascular: Regular rate and rhythm, no murmurs / rubs / gallops. No extremity edema. 2+ pedal pulses. No carotid bruits.  Abdomen: no tenderness, no masses palpated.  No hepatosplenomegaly. Bowel sounds positive.  Musculoskeletal: no clubbing / cyanosis. No joint deformity upper and lower extremities. Good ROM, no contractures. Normal muscle tone.  Skin: no rashes, lesions, ulcers. No induration Neurologic: CN 2-12 grossly intact.  Sensation intact, DTR normal. Strength 5/5 in all 4.  Psychiatric: Lethargic,    Labs on Admission: I have personally reviewed following labs and imaging studies  CBC: Recent Labs  Lab 09/13/19 0417 09/19/19 1432 09/19/19 1544  WBC 5.1 3.9*  --   NEUTROABS  --  3.1  --   HGB 9.9* 9.0* 9.9*  HCT 33.1* 31.2* 29.0*  MCV 95.1 95.4  --   PLT 240 203  --    Basic Metabolic Panel: Recent Labs  Lab 09/13/19 0417 09/19/19 1432 09/19/19 1544  NA 138 139 141  K 4.3 4.7 4.7  CL 100 102  --   CO2 30 26  --   GLUCOSE 115* 87  --   BUN 13 37*  --   CREATININE 0.99 1.96*  --   CALCIUM 8.6* 8.2*  --    GFR: Estimated Creatinine Clearance: 23.1 mL/min (A) (by C-G formula based on SCr of 1.96 mg/dL (H)). Liver Function Tests: Recent Labs  Lab 09/19/19 1432  AST 25  ALT 15  ALKPHOS 65  BILITOT 0.6  PROT 5.6*  ALBUMIN 2.5*   No results for input(s): LIPASE, AMYLASE in the last 168 hours. No results for input(s): AMMONIA in the last 168 hours. Coagulation Profile: No results for input(s): INR, PROTIME in the last 168 hours. Cardiac Enzymes: Recent Labs  Lab 09/19/19 1702  CKTOTAL 69   BNP (last 3 results) No results for input(s): PROBNP in the last 8760 hours. HbA1C: No results for input(s): HGBA1C in the last 72 hours. CBG: Recent Labs  Lab 09/19/19 1532  GLUCAP 81   Lipid Profile: No results for input(s): CHOL, HDL, LDLCALC, TRIG, CHOLHDL, LDLDIRECT in the last 72 hours. Thyroid Function Tests: No results for input(s): TSH, T4TOTAL, FREET4, T3FREE, THYROIDAB in the last 72 hours. Anemia Panel: No results for input(s): VITAMINB12, FOLATE, FERRITIN, TIBC, IRON, RETICCTPCT in the last 72 hours. Urine analysis:    Component Value Date/Time   COLORURINE AMBER (A) 09/19/2019 1702   APPEARANCEUR CLEAR 09/19/2019 1702   LABSPEC 1.013 09/19/2019 1702   PHURINE 5.0 09/19/2019 1702   GLUCOSEU NEGATIVE 09/19/2019 1702   HGBUR NEGATIVE 09/19/2019 1702   BILIRUBINUR  NEGATIVE 09/19/2019 1702   KETONESUR NEGATIVE 09/19/2019 1702   PROTEINUR NEGATIVE 09/19/2019 1702   UROBILINOGEN 1.0 09/24/2013 1951   NITRITE NEGATIVE 09/19/2019 1702   LEUKOCYTESUR TRACE (A) 09/19/2019 1702    Radiological Exams on Admission: CT Head Wo Contrast  Result Date: 09/19/2019 CLINICAL DATA:  Encephalopathy EXAM: CT HEAD WITHOUT CONTRAST CT CERVICAL SPINE WITHOUT CONTRAST TECHNIQUE: Multidetector CT imaging of the head and cervical spine was performed following the standard protocol without intravenous contrast. Multiplanar CT image reconstructions of the cervical spine were also generated. COMPARISON:  CT head and cervical spine 08/15/2016 FINDINGS: CT HEAD FINDINGS Brain: No evidence of acute infarction, hemorrhage, hydrocephalus, extra-axial collection or mass lesion/mass effect. Small cyst left inferior basal ganglia unchanged. Vascular: Negative for hyperdense vessel Skull: Negative Sinuses/Orbits: Paranasal sinuses clear.  No orbital lesion. Other: Motion degraded study. CT CERVICAL SPINE FINDINGS Alignment: Normal alignment with straightening of the cervical lordosis. Skull base and vertebrae: Negative for fracture. Soft tissues and spinal canal: Negative Disc levels: Multilevel disc and facet degeneration. There is arthrodesis C5-6 which  is likely surgical. Multilevel foraminal encroachment due to spurring including severe right foraminal encroachment at C2-3 and C4-5. Moderate to severe left foraminal encroachment at C3-4 and C4-5. Spondylosis and foraminal encroachment bilaterally at C6-7. Upper chest: Atherosclerotic aortic arch.  Lung apices clear. Other: None IMPRESSION: 1. No acute intracranial abnormality 2. No acute cervical spine abnormality. Advanced cervical spondylosis with severe foraminal encroachment bilaterally due to spurring. Electronically Signed   By: Franchot Gallo M.D.   On: 09/19/2019 15:43   CT Chest Wo Contrast  Result Date: 09/19/2019 CLINICAL DATA:   Encephalopathy. Altered mental status. Hypoxemia. Fall. EXAM: CT CHEST WITHOUT CONTRAST TECHNIQUE: Multidetector CT imaging of the chest was performed following the standard protocol without IV contrast. COMPARISON:  Chest radiograph earlier today.  No comparison CT. FINDINGS: Cardiovascular: Degradation secondary to patient arm position, not raised above the head. There is also beam hardening artifact from left humeral shaft fixation hardware. Aortic atherosclerosis. Moderate cardiomegaly, without pericardial effusion. Median sternotomy for CABG. Mediastinum/Nodes: No mediastinal or definite hilar adenopathy, given limitations of unenhanced CT. Lungs/Pleura: No pleural fluid. Bibasilar dependent subsegmental atelectasis. Mild centrilobular emphysema. Calcified left upper lobe granuloma. Tiny right-sided pulmonary nodules, including at maximally 3 mm on 29/16 in the right lower lobe. Upper Abdomen: Pneumobilia. Normal imaged portions of the spleen, adrenal glands, left kidney. Fatty atrophy throughout the pancreas. Proximal gastric underdistention. Possible upper pole right-sided caliectasis on 61/6. Musculoskeletal: Left humeral shaft fixation. Prior median sternotomy. Multiple lower anterior right rib remote fractures. Lower cervical and midthoracic spondylosis. Moderate right hemidiaphragm elevation. IMPRESSION: 1. Mild limitations as detailed above. 2. No acute findings within the chest. 3. Right-sided pulmonary nodules of up to 3 mm. Emphysema (ICD10-J43.9). Non-contrast chest CT can be considered in 12 months, given risk factors for primary bronchogenic carcinoma. This recommendation follows the consensus statement: Guidelines for Management of Incidental Pulmonary Nodules Detected on CT Images: From the Fleischner Society 2017; Radiology 2017; [id number]. 4. Possible mild upper pole right-sided caliectasis. Correlate with abdominal symptoms. Consider renal ultrasound. 5.  Aortic Atherosclerosis  (ICD10-I70.0). Electronically Signed   By: Abigail Miyamoto M.D.   On: 09/19/2019 15:49   CT Cervical Spine Wo Contrast  Result Date: 09/19/2019 CLINICAL DATA:  Encephalopathy EXAM: CT HEAD WITHOUT CONTRAST CT CERVICAL SPINE WITHOUT CONTRAST TECHNIQUE: Multidetector CT imaging of the head and cervical spine was performed following the standard protocol without intravenous contrast. Multiplanar CT image reconstructions of the cervical spine were also generated. COMPARISON:  CT head and cervical spine 08/15/2016 FINDINGS: CT HEAD FINDINGS Brain: No evidence of acute infarction, hemorrhage, hydrocephalus, extra-axial collection or mass lesion/mass effect. Small cyst left inferior basal ganglia unchanged. Vascular: Negative for hyperdense vessel Skull: Negative Sinuses/Orbits: Paranasal sinuses clear.  No orbital lesion. Other: Motion degraded study. CT CERVICAL SPINE FINDINGS Alignment: Normal alignment with straightening of the cervical lordosis. Skull base and vertebrae: Negative for fracture. Soft tissues and spinal canal: Negative Disc levels: Multilevel disc and facet degeneration. There is arthrodesis C5-6 which is likely surgical. Multilevel foraminal encroachment due to spurring including severe right foraminal encroachment at C2-3 and C4-5. Moderate to severe left foraminal encroachment at C3-4 and C4-5. Spondylosis and foraminal encroachment bilaterally at C6-7. Upper chest: Atherosclerotic aortic arch.  Lung apices clear. Other: None IMPRESSION: 1. No acute intracranial abnormality 2. No acute cervical spine abnormality. Advanced cervical spondylosis with severe foraminal encroachment bilaterally due to spurring. Electronically Signed   By: Franchot Gallo M.D.   On: 09/19/2019 15:43   DG Chest Portable 1  View  Result Date: 09/19/2019 CLINICAL DATA:  Short of breath, fell EXAM: PORTABLE CHEST 1 VIEW COMPARISON:  09/10/2019 FINDINGS: Single frontal view of the chest demonstrates stable postsurgical changes  from median sternotomy. The cardiac silhouette is unremarkable. No airspace disease, effusion, or pneumothorax. No acute bony abnormalities. IMPRESSION: 1. Stable exam, no acute process. Electronically Signed   By: Randa Ngo M.D.   On: 09/19/2019 14:58   DG Knee Complete 4 Views Right  Result Date: 09/19/2019 CLINICAL DATA:  [place] EXAM: RIGHT KNEE - COMPLETE 4+ VIEW COMPARISON:  None. FINDINGS: Frontal, bilateral oblique, lateral views of the right knee are obtained. No fracture, subluxation, or dislocation. 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 No joint effusion. IMPRESSION: 1. No acute bony abnormality. Electronically Signed   By: Randa Ngo M.D.   On: 09/19/2019 14:57    EKG: Independently reviewed.  Chronic LBBB, prolonged QTC  Assessment/Plan Active Problems:   AMS (altered mental status)   Syncope  (please populate well all problems here in Problem List. (For example, if patient is on BP meds at home and you resume or decide to hold them, it is a problem that needs to be her. Same for CAD, COPD, HLD and so on)  Acute metabolic encephalopathy and near syncope and fall -Symptoms related Entresto.  Hypotension.  Hold Entresto and Lasix and Aldactone for tonight. -Watch volume status, recheck orthostatic vital signs tomorrow morning, if no more orthostatic hypotension and patient mentation remained stable likely can discharge home tomorrow morning. -Given the acute event and previous episodes, recommend hold Entresto until seen by cardiologist in next office follow-up. -Also and PT evaluation  Acute hypoxic restaurant failure -Resolved.  CT chest reassuring no signs of CHF or pneumonia.  AKI -Likely from volume contraction secondary to Entresto. -Received 1 L bolus in the ED, given her history of CHF will hold off maintenance IV fluids, encourage oral intake, recheck BMP in the morning.  Chronic systolic CHF -Now looks mild  hypervolemia to euvolemia, BP still borderline low, hold all BP and diuresis medication for tonight.  Paroxysmal A. Fib -Continue amiodarone, cut down Eliquis dosage until kidney function recovers  Chronic normocytic anemia -H&H stable  Chronic back pain -Hold narcotics for tonight, patient agreed  DVT prophylaxis: Eliquis Code Status: Full Family Communication: Niece at bedside Disposition Plan: Likely can go home if BP remains stable and AKI improved. Consults called: None Admission status: Tele obs   Lequita Halt MD [facility] Pager [phone]    09/19/2019, 6:42 PM

## 2019-09-19 NOTE — ED Provider Notes (Signed)
[facility] EMERGENCY DEPARTMENT Provider Note   CSN: [id number] Arrival date & time: 09/19/19  1411     History No chief complaint on file.   Wendy [name] is a 69 y.o. female.  The history is provided by the EMS personnel, a relative and medical records. No language interpreter was used.  Altered Mental Status    69 year old female with history of chronic back pain currently on opiate medication, CHF, CAD, recurrent falls, cardiac disease, paroxysmal atrial fibrillation currently on Eliquis, brought here via EMS from home for evaluation of altered mental status.  History obtained through EMS.  They received a call to the house as patient was found to be altered.  Initially the story was she was found laying on the ground next to her bed last known normal at 9:30 PM last night.  Family member put her back in bed, subsequently throughout the day today she appears to be altered from her baseline.  Initially on initial examination by EMS, patient was found to be hypoxic with O2 sats in the 80s, nonrebreather was given.  Patient was also found to be hypotensive with systolic blood pressure in the 50s.  That has since improved with IV fluid upon arrival.  Additional history was limited.  Level 5 caveat due to altered mental status.  Patient's baseline mental status is A&O x4.  Past Medical History:  Diagnosis Date  . Anemia   . Anginal pain ([place])    jaw pain 07/2016, s/p 07/15/16 nuclear stress test  . Anxiety   . Arthritis    "lower back" (08/24/2017)  . Asthma   . [place] Obstructive Pulmonary Disease   . Chronic back pain   . Chronic combined systolic and diastolic CHF    EF 2/95: 62-13 // EF 4/21: 55-60 // EF 4/21: 20-25  . Chronic leg pain   . Chronic lower back pain   . Coronary Artery Disease    a. s/p NSTEMI >> CABG 1998 // s/p PCI 2013 // [place] 01/2015: DES to SVG-RCA.// Cath 05/2019: all grafts occluded; mod non-obs LAD disease w L-L and L-R collats - med Rx  // post Ao-Bifem bypass MI c/b PEA, CGS requiring Impella >> anatomy unchanged - Med Rx  . Depression   . Fall 08/2016  . Gastroesophageal reflux disease   . Glucose intolerance   . Headache    "a few/month" (08/24/2017)  . HLD (hyperlipidemia)   . Hx of DVT X 3   RLE  . Hypertension   . Myocardial infarction ([place]) 1999  . OSA on CPAP   . Paroxysmal atrial fibrillation    a. 09/2013 post-op b. 01/2015 // Apixaban // recurrent AFib post MI after Ao-Bifem in 06/2019 >> Amio Rx  . Peripheral artery disease    s/p L fem-fem bypass // s/p Ao-Bifem bypass in 06/2019  . Peripheral neuropathy   . Pneumonia   . Shortness of breath    when walking  . Wears glasses     Patient Active Problem List   Diagnosis Date Noted  . Prolonged QT interval 09/10/2019  . Acute respiratory failure with hypoxia ([place]) 09/10/2019  . Hypokalemia 09/10/2019  . Hypomagnesemia 09/10/2019  . Hematoma 07/21/2019  . Cardiogenic shock ([place])   . Aortoiliac occlusive disease ([place]) 06/18/2019  . Coronary artery disease of native artery of native heart with stable angina pectoris ([place])   . Infection of right prosthetic hip joint ([place]) 08/16/2017  . Acute on chronic combined systolic and  diastolic CHF (congestive heart failure) ([place]) 07/26/2016  . Troponin level elevated 07/26/2016  . Chronic anticoagulation 07/22/2016  . CAD S/P multiple PCIs-last one 2016 07/22/2016  . NSTEMI (non-ST elevated myocardial infarction) ([place]) 07/22/2016  . Primary osteoarthritis of right hip 06/27/2016  . Avascular necrosis of hip, right ([place]) 06/27/2016  . PVD (peripheral vascular disease) ([place])   . Essential (primary) hypertension 01/14/2015  . Coronary artery disease involving native coronary artery of native heart without angina pectoris 10/06/2014  . Acute angina ([place]) 05/12/2014  . Epigastric pain   . Chest pain with high risk of acute coronary syndrome 10/21/2013  . Acute respiratory failure ([place]) 09/20/2013  . Nonspecific  abnormal results of liver function study 09/17/2013  . Cholecystitis, acute 09/15/2013  . PAD (peripheral artery disease) ([place])   . COPD (chronic obstructive pulmonary disease) ([place])   . Peripheral neuropathy   . Chronic back pain   . Sleep apnea   . Anxiety   . GERD (gastroesophageal reflux disease)   . 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 Hypertension 07/20/2013  . HLD (hyperlipidemia) 07/20/2013  . Unstable angina ([place]) 07/19/2013  . Hx of CABG 07/19/2013    Past Surgical History:  Procedure Laterality Date  . ABDOMINAL AORTOGRAM W/LOWER EXTREMITY Bilateral 04/29/2019   Procedure: ABDOMINAL AORTOGRAM W/LOWER EXTREMITY;  Surgeon: Waynetta Sandy, MD;  Location: [facility];  Service: Cardiovascular;  Laterality: Bilateral;  . ANTERIOR CERVICAL DECOMP/DISCECTOMY FUSION  1991  . AORTA - BILATERAL FEMORAL ARTERY BYPASS GRAFT N/A 06/18/2019   Procedure: AORTA BIFEMORAL BYPASS GRAFT;  Surgeon: Waynetta Sandy, MD;  Location: [facility];  Service: Vascular;  Laterality: N/A;  . ARTERY REPAIR Left 06/30/2019   Procedure: LEFT BRACHIAL ARTERY REPAIR, EVACUATION OF HEMATOMA;  Surgeon: Serafina Mitchell, MD;  Location: [facility];  Service: Vascular;  Laterality: Left;  . BACK SURGERY    . CARDIAC CATHETERIZATION N/A 01/22/2015   Procedure: Left Heart Cath and Coronary Angiography;  Surgeon: Troy Sine, MD;  Location: [facility];  Service: Cardiovascular;  Laterality: N/A;  . CARDIAC CATHETERIZATION N/A 01/22/2015   Procedure: Coronary Stent Intervention;  Surgeon: Troy Sine, MD;  Location: [facility];  Service: Cardiovascular;  Laterality: N/A;  3.0x12 xience prox SVG to RCA  . CARDIAC CATHETERIZATION  03/09/2000   hx/notes 03/20/2000  . CHOLECYSTECTOMY N/A 09/16/2013   Procedure: LAPAROSCOPIC CHOLECYSTECTOMY CONVERTED TO OPEN CHOLECYSTECTOMY WITH CHOLANGIOGRAM;  Surgeon: Harl , MD;  Location: [facility];  Service: General;  Laterality: N/A;  .  CORONARY ANGIOGRAPHY N/A 06/27/2019   Procedure: CORONARY ANGIOGRAPHY (CATH LAB);  Surgeon: Sherren Mocha, MD;  Location: [facility];  Service: Cardiovascular;  Laterality: N/A;  . CORONARY ANGIOPLASTY WITH STENT PLACEMENT  11/2011, 02/2012, 01/2015   DES to SVG-RCA both times, In [place], [place], Dr. Bruce Donath  . CORONARY ARTERY BYPASS GRAFT  1998   LIMA-LAD, SVG-OM, SVG-RCA  . ERCP N/A 09/19/2013   Procedure: ENDOSCOPIC RETROGRADE CHOLANGIOPANCREATOGRAPHY (ERCP);  Surgeon: [name], MD;  Location: [facility];  Service: Endoscopy;  Laterality: N/A;  . FINGER SURGERY Right    ring finger "fused"  . FRACTURE SURGERY    . IRRIGATION AND DEBRIDEMENT ABSCESS Right 10/17/2013   Procedure: INCISION AND DRAINAGE RIGHT SUBCOSTAL WOUND;  Surgeon: [name], MD;  Location: [facility];  Service: General;  Laterality: Right;  . JOINT REPLACEMENT    . KNEE ARTHROSCOPY Right 1998  . LEFT HEART CATH AND CORS/GRAFTS ANGIOGRAPHY N/A  07/25/2016   Procedure: Left Heart Cath and Cors/Grafts Angiography;  Surgeon: Jettie Booze, MD;  Location: [facility];  Service: Cardiovascular;  Laterality: N/A;  . LEFT HEART CATH AND CORS/GRAFTS ANGIOGRAPHY N/A 06/05/2019   Procedure: LEFT HEART CATH AND CORS/GRAFTS ANGIOGRAPHY;  Surgeon: Burnell Blanks, MD;  Location: [facility];  Service: Cardiovascular;  Laterality: N/A;  . LEFT HEART CATHETERIZATION WITH CORONARY/GRAFT ANGIOGRAM N/A 07/19/2013   Procedure: LEFT HEART CATHETERIZATION WITH Beatrix Fetters;  Surgeon: Leonie Man, MD;  Location: [facility];  Service: Cardiovascular;  Laterality: N/A;  . MASS EXCISION Right 06/25/2014   Procedure: EXCISION BUTTOCK MASS ;  Surgeon: Coralie Keens, MD;  Location: [facility];  Service: General;  Laterality: Right;  . ORIF HUMERUS FRACTURE Left 08/27/2013   Procedure: OPEN REDUCTION INTERNAL FIXATION (ORIF) LEFT HUMERUS ;  Surgeon: Rozanna Box, MD;   Location: [facility];  Service: Orthopedics;  Laterality: Left;  . PLACEMENT OF IMPELLA LEFT VENTRICULAR ASSIST DEVICE N/A 06/24/2019   Procedure: PLACEMENT OF IMPELLA LEFT VENTRICULAR ASSIST DEVICE;  Surgeon: Wonda Olds, MD;  Location: [facility];  Service: Open Heart Surgery;  Laterality: N/A;  . [place]  . REMOVAL OF IMPELLA LEFT VENTRICULAR ASSIST DEVICE Right 06/28/2019   Procedure: REMOVAL OF IMPELLA LEFT VENTRICULAR ASSIST DEVICE;  Surgeon: Wonda Olds, MD;  Location: [facility];  Service: Open Heart Surgery;  Laterality: Right;  . THROMBECTOMY / EMBOLECTOMY FEMORAL ARTERY Right    hx/notes 03/20/2000  . TOTAL HIP ARTHROPLASTY Right 07/19/2016   Procedure: TOTAL HIP ARTHROPLASTY ANTERIOR APPROACH;  Surgeon: [name], MD;  Location: [facility];  Service: Orthopedics;  Laterality: Right;  . TOTAL HIP REVISION Right 08/22/2017   Procedure: TOTAL HIP REVISION;  Surgeon: [name], MD;  Location: [facility];  Service: Orthopedics;  Laterality: Right;     OB History   No obstetric history on file.     Family History  Problem Relation Age of Onset  . Lung cancer Mother   . Clotting disorder Father   . Lung cancer Sister   . Heart disease Brother   . Heart disease Brother   . Colon cancer Neg Hx   . Breast cancer Neg Hx     Social History   Tobacco Use  . Smoking status: Former Smoker    Packs/day: 0.50    Years: 34.00    Pack years: 17.00    Types: Cigarettes    Quit date: 07/20/1998    Years since quitting: 21.1  . Smokeless tobacco: Never Used  Vaping Use  . Vaping Use: Never used  Substance Use Topics  . Alcohol use: Not Currently  . Drug use: Yes    Types: Marijuana    Home Medications Prior to Admission medications   Medication Sig Start Date End Date Taking? Authorizing Provider  acetaminophen (TYLENOL) 500 MG tablet Take 500-1,000 mg by mouth every 6 (six) hours as needed (for pain.).    [provider]  albuterol  (PROVENTIL HFA;VENTOLIN HFA) 108 (90 Base) MCG/ACT inhaler Inhale 2 puffs into the lungs every 6 (six) hours as needed for wheezing or shortness of breath. 01/01/18   Bhagat, Crista Luria, PA  ALPRAZolam (XANAX) 0.5 MG tablet Take 0.5 mg by mouth 3 (three) times daily as needed for anxiety.    [provider]  amiodarone (PACERONE) 100 MG tablet Take 1 tablet (100 mg total) by mouth daily. 08/15/19  Nahser, Wonda Cheng, MD  apixaban (ELIQUIS) 5 MG TABS tablet Take 1 tablet (5 mg total) by mouth 2 (two) times daily. 11/20/18   Nahser, Wonda Cheng, MD  aspirin 81 MG chewable tablet Chew 1 tablet (81 mg total) by mouth daily. 07/10/19   Dagoberto Ligas, PA-C  atorvastatin (LIPITOR) 80 MG tablet TAKE ONE TABLET BY MOUTH DAILY AT 6PM Patient taking differently: Take 80 mg by mouth daily at 6 PM. TAKE ONE TABLET BY MOUTH DAILY AT [facility] 11/20/18   Nahser, Wonda Cheng, MD  buPROPion (WELLBUTRIN XL) 150 MG 24 hr tablet Take 150 mg by mouth daily.    [provider]  diclofenac Sodium (VOLTAREN) 1 % GEL Apply 1 g topically 4 (four) times daily as needed for pain. 02/15/19   [provider]  ezetimibe (ZETIA) 10 MG tablet Take 10 mg by mouth daily.    [provider]  furosemide (LASIX) 20 MG tablet Take 1 tablet (20 mg total) by mouth daily. 09/13/19   Oswald Hillock, MD  gabapentin (NEURONTIN) 300 MG capsule Take 300 mg by mouth 3 (three) times daily as needed for pain. 02/15/19   [provider]  nitroGLYCERIN (NITROSTAT) 0.4 MG SL tablet Place 1 tablet (0.4 mg total) under the tongue every 5 (five) minutes as needed for chest pain. 11/20/18   Nahser, Wonda Cheng, MD  oxyCODONE 10 MG TABS Take 1 tablet (10 mg total) by mouth 3 (three) times daily. 07/12/19   Ulyses Amor, PA-C  pantoprazole (PROTONIX) 40 MG tablet Take 1 tablet (40 mg total) by mouth daily. 11/20/18   Nahser, Wonda Cheng, MD  pramipexole (MIRAPEX) 0.25 MG tablet Take 0.25 mg by mouth at bedtime.  02/18/19   [provider]  promethazine (PHENERGAN) 12.5 MG tablet Take 1 tablet (12.5 mg total) by mouth every 6 (six) hours as needed for nausea or vomiting. 08/02/19   Waynetta Sandy, MD  ranolazine (RANEXA) 1000 MG SR tablet Take 1,000 mg by mouth 2 (two) times daily.    [provider]  sacubitril-valsartan (ENTRESTO) 24-26 MG Take 1 tablet by mouth 2 (two) times daily. 09/13/19   Oswald Hillock, MD  sertraline (ZOLOFT) 100 MG tablet Take 150 mg by mouth at bedtime.     [provider]  spironolactone (ALDACTONE) 25 MG tablet Take 1 tablet (25 mg total) by mouth daily. 07/13/19   Ulyses Amor, PA-C    Allergies    Benadryl [diphenhydramine], Penicillins, Adhesive [tape], Amoxicillin, Cyclobenzaprine, and Hydrocodone  Review of Systems   Review of Systems  Unable to perform ROS: Mental status change    Physical Exam Updated Vital Signs BP (!) 81/49 (BP Location: Right Arm)   Pulse 68   Resp 15   SpO2 (!) 88%   Physical Exam Vitals and nursing note reviewed.  Constitutional:      Appearance: She is well-developed. She is ill-appearing.     Comments: Patient appears older than stated age, ill-appearing  HENT:     Head: Normocephalic and atraumatic.     Comments: No obvious signs of head trauma. Eyes:     Extraocular Movements: Extraocular movements intact.     Conjunctiva/sclera: Conjunctivae normal.     Pupils: Pupils are equal, round, and reactive to light.     Comments: Pupils 3 mm reactive bilaterally.  Cardiovascular:     Rate and Rhythm: Normal rate and regular rhythm.  Pulmonary:     Comments: Diminished breath sounds without  overt wheezes, rales, or rhonchi Abdominal:     Palpations: Abdomen is soft.     Tenderness: There is no abdominal tenderness.     Comments: There is a surgical wound to the mid lower abdomen with packing in place and with good surrounding granular tissue and no significant erythema noted.  Musculoskeletal:     Cervical back:  Neck supple.     Comments: Able to follow command, global weakness but equal strength throughout  Right knee: Small skin tear to the lateral aspect of the knee without any obvious deformity noted.  Normal knee flexion and extension.  Patella is located.  Skin:    Findings: No rash.  Neurological:     GCS: GCS eye subscore is 4. GCS verbal subscore is 4. GCS motor subscore is 6.     Cranial Nerves: No facial asymmetry.     Sensory: Sensation is intact.     Motor: Weakness present.     ED Results / Procedures / Treatments   Labs (all labs ordered are listed, but only abnormal results are displayed) Labs Reviewed  CBC WITH DIFFERENTIAL/PLATELET - Abnormal; Notable for the following components:      Result Value   WBC 3.9 (*)    RBC 3.27 (*)    Hemoglobin 9.0 (*)    HCT 31.2 (*)    MCHC 28.8 (*)    Lymphs Abs 0.5 (*)    All other components within normal limits  SARS CORONAVIRUS 2 BY RT PCR (HOSPITAL ORDER, [facility])  COMPREHENSIVE METABOLIC PANEL  URINALYSIS, ROUTINE W REFLEX MICROSCOPIC  BLOOD GAS, VENOUS  LACTIC ACID, PLASMA  CBG MONITORING, ED  TROPONIN I (HIGH SENSITIVITY)    EKG None  Radiology DG Chest Portable 1 View  Result Date: 09/19/2019 CLINICAL DATA:  Short of breath, fell EXAM: PORTABLE CHEST 1 VIEW COMPARISON:  09/10/2019 FINDINGS: Single frontal view of the chest demonstrates stable postsurgical changes from median sternotomy. The cardiac silhouette is unremarkable. No airspace disease, effusion, or pneumothorax. No acute bony abnormalities. IMPRESSION: 1. Stable exam, no acute process. Electronically Signed   By: Randa Ngo M.D.   On: 09/19/2019 14:58   DG Knee Complete 4 Views Right  Result Date: 09/19/2019 CLINICAL DATA:  [place] EXAM: RIGHT KNEE - COMPLETE 4+ VIEW COMPARISON:  None. FINDINGS: Frontal, bilateral oblique, lateral views of the right knee are obtained. No fracture, subluxation, or dislocation. There is mild 3  compartmental osteoarthritis greatest in the patellofemoral compartment. No joint effusion. IMPRESSION: 1. No acute bony abnormality. Electronically Signed   By: Randa Ngo M.D.   On: 09/19/2019 14:57    Procedures .Critical Care Performed by: Domenic Moras, PA-C Authorized by: Domenic Moras, PA-C   Critical care provider statement:    Critical care time (minutes):  30   Critical care was time spent personally by me on the following activities:  Discussions with consultants, evaluation of patient's response to treatment, examination of patient, ordering and performing treatments and interventions, ordering and review of laboratory studies, ordering and review of radiographic studies, pulse oximetry, re-evaluation of patient's condition, obtaining history from patient or surrogate and review of old charts   (including critical care time)  Medications Ordered in ED Medications  sodium chloride 0.9 % bolus 1,000 mL (1,000 mLs Intravenous New Bag/Given 09/19/19 1455)    ED Course  I have reviewed the triage vital signs and the nursing notes.  Pertinent labs & imaging results that were available during my  care of the patient were reviewed by me and considered in my medical decision making (see chart for details).  Clinical Course as of Sep 18 1512  Thu Sep 19, 2019  1508 LKN 730 pm by family. Usually O x4.  Hypoxia and hypotensive in SBP 50s.  Got < 1 L by EMS. Recent dx for CAP. Putting on BiPap. Labs ordered including VBG.  Scans pending.   [CG]  1509 BP(!): 81/49 [CG]  1509 SpO2(!): 88 % [CG]    Clinical Course User Index [CG] Kinnie Feil, PA-C   MDM Rules/Calculators/A&P                          BP (!) 81/49 (BP Location: Right Arm)   Pulse 68   Resp 15   SpO2 (!) 88%   Final Clinical Impression(s) / ED Diagnoses Final diagnoses:  None    Rx / DC Orders ED Discharge Orders    None     2:36 PM Patient brought here for altered mental status and hypoxia.  Was  previously admitted to the hospital for community-acquired pneumonia on 01/11/2020, discharged on 09/13/2019.  Was at her baseline mental status approximately 730 last night according to her sister who spoke with her on the phone.  Today she appears to be altered, unable to answer simple questions.  She is currently on Eliquis.  She does have history of COPD and CHF.  She initially was hypoxic with O2 sats in the 80s.  Will place on BiPAP, give IV fluid, work-up initiated.  Will obtain head and cervical spine CT scan, along with chest CT.  Xray of R knee.  2:43 PM Patient is currently on Eliquis, will obtain stat head and cervical spine CT scan due to fall.  Unsure if she has any possible syncope.  Due to her hypoxic state, will give BiPAP, will check VBG.  3:14 PM Pt sign out to oncoming team who will f/u on labs and CT results, reassess and will need to be admitted for AMS and hypoxia requiring supplemental O2.  Care discussed with Dr. Langston Masker.    Domenic Moras, PA-C 09/19/19 1517    Wyvonnia Dusky, MD 09/20/19 1027

## 2019-09-19 NOTE — Patient Outreach (Addendum)
[facility]) Care Management  09/19/2019  Wendy [name] [birth year] [id number]   Transition of Care Referral  Referral Date:09/13/2019 Referral [facility] Liaison Date of Discharge:09/13/2019 Facility:[facility] Insurance:[employer] Medicare *PCP Office Does [facility]*   Outreach attempt # 3 to patient. No answer at present.      Plan: RN CM will make outreach attempt to patient within 3 weeks if no response from letter mailed to patient.    Enzo Montgomery, RN,BSN,CCM [facility] Management Telephonic Care Management Coordinator Direct Phone: [phone] Toll Free: [phone] Fax: [phone]

## 2019-09-19 NOTE — ED Notes (Signed)
Pt was told about foley cath, pt refused cath. Pt preferred a pure-wick; pt is aware that if no urine output is collected within 30 minutes of pure-wick placement, a foley catch would have to be placed in order to have urine output.

## 2019-09-19 NOTE — ED Provider Notes (Signed)
Patient handed off to me by previous EDPA at shift change.  See previous note for details.    Briefly patient here for AMS.  Takes oxycodone, xanax chronically.  Recent admission for CAP. H/o CHF with poor EF. On anticoagulants. Fall this AM and altered.   Arrived altered, minimally responsive to commands, hypoxic and hypotensive despite 1 L IVF by EMS. Put on bipap. Pending all labs, imaging at shift change. Anticipate admission.    Physical Exam  BP (!) 81/49 (BP Location: Right Arm)   Pulse 68   Temp 97.6 F (36.4 C) (Rectal)   Resp 15   SpO2 (!) 88%    ED Course/Procedures   Clinical Course as of Sep 19 1747  Thu Sep 19, 2019  1508 LKN 730 pm by family. Usually O x4.  Fall this morning noted be be confused.  Arrived with hypoxia and hypotensive in SBP 50s.  Got < 1 L by EMS. Recent dx and admission for CAP. Putting on BiPap. Labs ordered including VBG.  Scans pending. Will need admission.    [CG]  1509 BP(!): 81/49 [CG]  1509 SpO2(!): 88 % [CG]  1520 Non acute   DG Chest Portable 1 View [CG]  1520 Non acute   DG Knee Complete 4 Views Right [CG]  1520 WBC(!): 3.9 [CG]  1521 Hemoglobin(!): 9.0 [CG]  1521 HCT(!): 31.2 [CG]  1521 Rectal   Temp: 97.6 F (36.4 C) [CG]  1552 pH, Ven: 7.269 [CG]  1552 pCO2, Ven: 56.0 [CG]  1553 Creatinine(!): 1.96 [CG]  1553 GFR, Est Non African American(!): 26 [CG]  1553 BUN(!): 37 [CG]  1553 IMPRESSION: 1. Mild limitations as detailed above. 2. No acute findings within the chest. 3. Right-sided pulmonary nodules of up to 3 mm. Emphysema (ICD10-J43.9). Non-contrast chest CT can be considered in 12 months, given risk factors for primary bronchogenic carcinoma. This recommendation follows the consensus statement: Guidelines for Management of Incidental Pulmonary Nodules Detected on CT Images: From the Fleischner Society 2017; Radiology 2017; 284:228-243. 4. Possible mild upper pole right-sided caliectasis. Correlate with abdominal symptoms.  Consider renal ultrasound. 5. Aortic Atherosclerosis (ICD10-I70.0).  CT Chest Wo Contrast [CG]  1646 Reevaluated patient.  Awake.  Following commands but appears slightly tired/drowsy.  On 3 L [place] with 100% SPO2.  SBP improved 109.   [CG]  1649 B Natriuretic Peptide(!): 200.9 [CG]  1738 Leukocytes,Ua(!): TRACE [CG]  1738 WBC, UA: 6-10 [CG]  1738 Bacteria, UA(!): RARE [CG]  1747 Benzodiazepines(!): POSITIVE [CG]  1747 Opiates(!): POSITIVE [CG]    Clinical Course User Index [CG] Kinnie Feil, PA-C    Procedures  MDM   I evaluated patient at shift change.  Awake.  Can follow commands like left and hold all extremities.  Denies any pain.  Appears somnolent.  Family at bedside confirms history obtained from previous ED PA.  Pending labs and CTs.  1610: ER work-up personally visualized and interpreted.    VBG with normal pH and CO2.  CBG 82.  Stable anemia.  WBC 3.9.  Creatinine 1.96, BUN 37, acute.  Troponin is 16, she denies chest pain to me.  CT scans and x-rays without explanation to explain patient's AMS.  Incidentally noted right-sided pulmonary nodule, recommending Noncon CT chest in 12 months.  Possible mild upper pole right-sided calyectasis.  She has no abdominal tenderness, no urinary symptoms, pending UA.  Patient receiving 1 L IVF.  Given benign VBG, have asked RT to wean off BiPAP and try [place].  Pending Covid, BNP, UA and UDS.  1750: SBP significantly improved after 2 L IVF here.  Re-evaluated patient, more alert but still appears somnolent/fatigue. Denies any other complaints. Denies taking extra doses of oxycodone/xanax.  cathed UA with trace leuks, 6-10 WBC, rare bacteria. Denies UTI symptoms, will send for culture.  No completely weaned off oxygen on RA.   Given initial critical presentation hypoxia, hypotension, AMS will speak to hospital for admission for AKI, IVF.  ?opioid xanax OD.       Kinnie Feil, PA-C 09/19/19 1755    Carmin Muskrat,  MD 09/21/19 [phone]

## 2019-09-20 ENCOUNTER — Encounter (HOSPITAL_COMMUNITY): Payer: Medicare HMO | Admitting: Cardiology

## 2019-09-20 ENCOUNTER — Observation Stay (HOSPITAL_COMMUNITY): Payer: Medicare HMO

## 2019-09-20 DIAGNOSIS — M549 Dorsalgia, unspecified: Secondary | ICD-10-CM | POA: Diagnosis present

## 2019-09-20 DIAGNOSIS — N179 Acute kidney failure, unspecified: Secondary | ICD-10-CM | POA: Diagnosis present

## 2019-09-20 DIAGNOSIS — J449 Chronic obstructive pulmonary disease, unspecified: Secondary | ICD-10-CM | POA: Diagnosis present

## 2019-09-20 DIAGNOSIS — I255 Ischemic cardiomyopathy: Secondary | ICD-10-CM | POA: Diagnosis present

## 2019-09-20 DIAGNOSIS — I951 Orthostatic hypotension: Secondary | ICD-10-CM | POA: Diagnosis present

## 2019-09-20 DIAGNOSIS — I110 Hypertensive heart disease with heart failure: Secondary | ICD-10-CM | POA: Diagnosis present

## 2019-09-20 DIAGNOSIS — I739 Peripheral vascular disease, unspecified: Secondary | ICD-10-CM | POA: Diagnosis present

## 2019-09-20 DIAGNOSIS — R55 Syncope and collapse: Secondary | ICD-10-CM | POA: Diagnosis not present

## 2019-09-20 DIAGNOSIS — J9601 Acute respiratory failure with hypoxia: Secondary | ICD-10-CM | POA: Diagnosis present

## 2019-09-20 DIAGNOSIS — D649 Anemia, unspecified: Secondary | ICD-10-CM | POA: Diagnosis present

## 2019-09-20 DIAGNOSIS — Z951 Presence of aortocoronary bypass graft: Secondary | ICD-10-CM | POA: Diagnosis not present

## 2019-09-20 DIAGNOSIS — I480 Paroxysmal atrial fibrillation: Secondary | ICD-10-CM | POA: Diagnosis present

## 2019-09-20 DIAGNOSIS — I252 Old myocardial infarction: Secondary | ICD-10-CM | POA: Diagnosis not present

## 2019-09-20 DIAGNOSIS — Z86718 Personal history of other venous thrombosis and embolism: Secondary | ICD-10-CM | POA: Diagnosis not present

## 2019-09-20 DIAGNOSIS — R296 Repeated falls: Secondary | ICD-10-CM | POA: Diagnosis present

## 2019-09-20 DIAGNOSIS — Z7901 Long term (current) use of anticoagulants: Secondary | ICD-10-CM | POA: Diagnosis not present

## 2019-09-20 DIAGNOSIS — E785 Hyperlipidemia, unspecified: Secondary | ICD-10-CM | POA: Diagnosis present

## 2019-09-20 DIAGNOSIS — Z96641 Presence of right artificial hip joint: Secondary | ICD-10-CM | POA: Diagnosis present

## 2019-09-20 DIAGNOSIS — G9341 Metabolic encephalopathy: Secondary | ICD-10-CM

## 2019-09-20 DIAGNOSIS — Z87891 Personal history of nicotine dependence: Secondary | ICD-10-CM | POA: Diagnosis not present

## 2019-09-20 DIAGNOSIS — G8929 Other chronic pain: Secondary | ICD-10-CM | POA: Diagnosis present

## 2019-09-20 DIAGNOSIS — D500 Iron deficiency anemia secondary to blood loss (chronic): Secondary | ICD-10-CM | POA: Diagnosis not present

## 2019-09-20 DIAGNOSIS — I2510 Atherosclerotic heart disease of native coronary artery without angina pectoris: Secondary | ICD-10-CM | POA: Diagnosis present

## 2019-09-20 DIAGNOSIS — D509 Iron deficiency anemia, unspecified: Secondary | ICD-10-CM | POA: Diagnosis present

## 2019-09-20 DIAGNOSIS — I509 Heart failure, unspecified: Secondary | ICD-10-CM | POA: Diagnosis not present

## 2019-09-20 DIAGNOSIS — R911 Solitary pulmonary nodule: Secondary | ICD-10-CM | POA: Diagnosis present

## 2019-09-20 DIAGNOSIS — Z20822 Contact with and (suspected) exposure to covid-19: Secondary | ICD-10-CM | POA: Diagnosis present

## 2019-09-20 DIAGNOSIS — I5022 Chronic systolic (congestive) heart failure: Secondary | ICD-10-CM | POA: Diagnosis present

## 2019-09-20 LAB — CBC
HCT: 34.1 % — ABNORMAL LOW (ref 36.0–46.0)
Hemoglobin: 10.2 g/dL — ABNORMAL LOW (ref 12.0–15.0)
MCH: 28.4 pg (ref 26.0–34.0)
MCHC: 29.9 g/dL — ABNORMAL LOW (ref 30.0–36.0)
MCV: 95 fL (ref 80.0–100.0)
Platelets: 197 10*3/uL (ref 150–400)
RBC: 3.59 MIL/uL — ABNORMAL LOW (ref 3.87–5.11)
RDW: 15.2 % (ref 11.5–15.5)
WBC: 4.1 10*3/uL (ref 4.0–10.5)
nRBC: 0 % (ref 0.0–0.2)

## 2019-09-20 LAB — GLUCOSE, CAPILLARY
Glucose-Capillary: 67 mg/dL — ABNORMAL LOW (ref 70–99)
Glucose-Capillary: 89 mg/dL (ref 70–99)

## 2019-09-20 LAB — BASIC METABOLIC PANEL
Anion gap: 11 (ref 5–15)
BUN: 29 mg/dL — ABNORMAL HIGH (ref 8–23)
CO2: 23 mmol/L (ref 22–32)
Calcium: 8.6 mg/dL — ABNORMAL LOW (ref 8.9–10.3)
Chloride: 105 mmol/L (ref 98–111)
Creatinine, Ser: 1.3 mg/dL — ABNORMAL HIGH (ref 0.44–1.00)
GFR calc Af Amer: 49 mL/min — ABNORMAL LOW (ref >60)
GFR calc non Af Amer: 42 mL/min — ABNORMAL LOW (ref >60)
Glucose, Bld: 77 mg/dL (ref 70–99)
Potassium: 3.8 mmol/L (ref 3.5–5.1)
Sodium: 139 mmol/L (ref 135–145)

## 2019-09-20 MED ORDER — OXYCODONE HCL 5 MG PO TABS
10.0000 mg | ORAL_TABLET | Freq: Three times a day (TID) | ORAL | Status: DC
  Administered 2019-09-20 – 2019-09-22 (×7): 10 mg via ORAL
  Filled 2019-09-20 (×7): qty 2

## 2019-09-20 MED ORDER — APIXABAN 2.5 MG PO TABS
2.5000 mg | ORAL_TABLET | Freq: Once | ORAL | Status: AC
  Administered 2019-09-20: 2.5 mg via ORAL
  Filled 2019-09-20: qty 1

## 2019-09-20 MED ORDER — APIXABAN 5 MG PO TABS
5.0000 mg | ORAL_TABLET | Freq: Two times a day (BID) | ORAL | Status: DC
  Administered 2019-09-20 – 2019-09-22 (×4): 5 mg via ORAL
  Filled 2019-09-20 (×4): qty 1

## 2019-09-20 MED ORDER — SERTRALINE HCL 50 MG PO TABS
150.0000 mg | ORAL_TABLET | Freq: Every day | ORAL | Status: DC
  Administered 2019-09-20 – 2019-09-21 (×2): 150 mg via ORAL
  Filled 2019-09-20 (×2): qty 1

## 2019-09-20 MED ORDER — RANOLAZINE ER 500 MG PO TB12
1000.0000 mg | ORAL_TABLET | Freq: Two times a day (BID) | ORAL | Status: DC
  Administered 2019-09-20 – 2019-09-22 (×4): 1000 mg via ORAL
  Filled 2019-09-20 (×4): qty 2

## 2019-09-20 MED ORDER — SODIUM CHLORIDE 0.9 % IV BOLUS
250.0000 mL | Freq: Once | INTRAVENOUS | Status: AC
  Administered 2019-09-20: 250 mL via INTRAVENOUS

## 2019-09-20 MED ORDER — AMIODARONE HCL 100 MG PO TABS
100.0000 mg | ORAL_TABLET | Freq: Every day | ORAL | Status: DC
  Administered 2019-09-21 – 2019-09-22 (×2): 100 mg via ORAL
  Filled 2019-09-20 (×2): qty 1

## 2019-09-20 NOTE — Progress Notes (Signed)
[facility] PROGRESS NOTE    Progress Note  Wendy [name]  ZOX:[id number] DOB: [birth year] DOA: 09/19/2019 PCP: Josetta Huddle, MD     Brief Narrative:   Wendy [name] is an 69 y.o. female past medical history significant for ischemic cardiomyopathy, chronic systolic heart failure with an EF of 45%, essential hypertension paroxysmal atrial fibrillation on Eliquis, status post CABG who comes in for acute encephalopathy.  Patient was recently discharged on 09/13/2019 for heart failure medications were adjusted Entresto was replaced with ARB and HCTZ, since then she has been complaining of lightheadedness and weakness.  Of admission she was restarted taking her depression medication and she started feeling lightheaded and fell to the floor without loss of consciousness.  Assessment/Plan:   Acute metabolic encephalopathy Concern about Entresto causing hypotension leading to near syncope and confusion. Diuretic therapy and Delene Loll were held on admission. Statics are pending, she was hypotensive on admission her blood pressure this morning is 117/74.  Acute kidney injury: With a baseline creatinine of less than 1, likely prerenal azotemia, hold diuretic therapy will give her a bolus of normal saline recheck basic metabolic panel in the morning. Her creatinine is slowly returning to baseline.  Orthostatic hypotension/ Syncope: Likely due to hypovolemia in the setting of changing of her medication. Discontinue hydralazine  Chronic systolic heart failure: She is definitely hypovolemic hold all antihypertensive medication, recheck orthostatic vitals after normal saline bolus.  Paroxysmal atrial fibrillation: Continue amiodarone and Eliquis.  Chronic normocytic anemia: Hemoglobin is stable.  Acute respiratory failure with hypoxia: Of unclear etiology CT reassuring no signs of heart failure or pneumonia unlikely PE she is not tachycardic or hypoxic anymore.  Chronic back  pain: Resume narcotics.  DVT prophylaxis: apixiban Family Communication:none Status is: Observation  The patient will require care spanning > 2 midnights and should be moved to inpatient because: Hemodynamically unstable  Dispo: The patient is from: Home              Anticipated d/c is to: Home              Anticipated d/c date is: 2 days              Patient currently is not medically stable to d/c.   Code Status:     Code Status Orders  (From admission, onward)         Start     Ordered   09/19/19 1841  Full code  Continuous        09/19/19 1842        Code Status History    Date Active Date Inactive Code Status Order ID Comments User Context   09/10/2019 1504 09/13/2019 1923 Full Code [id number]  Norval Morton, MD ED   07/21/2019 1824 07/23/2019 2152 Full Code [id number]  Elam Dutch, MD ED   06/18/2019 1633 07/12/2019 2358 Full Code [id number]  Iline Oven Inpatient   06/05/2019 1349 06/05/2019 2233 Full Code [id number]  Burnell Blanks, MD Inpatient   04/29/2019 1144 04/29/2019 2258 Full Code [id number]  Waynetta Sandy, MD Inpatient   08/22/2017 2327 08/25/2017 1736 Full Code [id number]  Prudencio Burly III, PA-C Inpatient   08/15/2016 1617 08/16/2016 2111 Full Code [id number]  Verlee Monte, MD Inpatient   07/22/2016 1810 07/26/2016 1642 Full Code [id number]  Erlene Quan, PA-C ED   07/19/2016 1641 07/20/2016 1723 Full Code [id number]  Prudencio Burly III, PA-C Inpatient   01/22/2015 1209 01/23/2015 1418 Full Code  [id number]  Troy Sine, MD Inpatient   01/21/2015 2014 01/22/2015 1209 Full Code [id number]  Erma Heritage, [place] Inpatient   05/12/2014 0517 05/12/2014 2333 Full Code [id number]  Theressa Millard, MD Inpatient   10/17/2013 2006 10/22/2013 1735 Full Code [id number]  Rush Landmark Inpatient   09/15/2013 2120 09/21/2013 2125 Full Code [id number]  Bynum Bellows, MD Inpatient   08/27/2013 1538 08/28/2013 1522 Full Code [id number]   Leone Payor Inpatient   07/19/2013 2307 07/20/2013 1245 Full Code [id number]  Reola Mosher Inpatient   07/19/2013 1741 07/19/2013 2307 Full Code [id number]  Leonie Man, MD Inpatient   Advance Care Planning Activity        IV Access:    Peripheral IV   Procedures and diagnostic studies:   CT Head Wo Contrast  Result Date: 09/19/2019 CLINICAL DATA:  Encephalopathy EXAM: CT HEAD WITHOUT CONTRAST CT CERVICAL SPINE WITHOUT CONTRAST TECHNIQUE: Multidetector CT imaging of the head and cervical spine was performed following the standard protocol without intravenous contrast. Multiplanar CT image reconstructions of the cervical spine were also generated. COMPARISON:  CT head and cervical spine 08/15/2016 FINDINGS: CT HEAD FINDINGS Brain: No evidence of acute infarction, hemorrhage, hydrocephalus, extra-axial collection or mass lesion/mass effect. Small cyst left inferior basal ganglia unchanged. Vascular: Negative for hyperdense vessel Skull: Negative Sinuses/Orbits: Paranasal sinuses clear.  No orbital lesion. Other: Motion degraded study. CT CERVICAL SPINE FINDINGS Alignment: Normal alignment with straightening of the cervical lordosis. Skull base and vertebrae: Negative for fracture. Soft tissues and spinal canal: Negative Disc levels: Multilevel disc and facet degeneration. There is arthrodesis C5-6 which is likely surgical. Multilevel foraminal encroachment due to spurring including severe right foraminal encroachment at C2-3 and C4-5. Moderate to severe left foraminal encroachment at C3-4 and C4-5. Spondylosis and foraminal encroachment bilaterally at C6-7. Upper chest: Atherosclerotic aortic arch.  Lung apices clear. Other: None IMPRESSION: 1. No acute intracranial abnormality 2. No acute cervical spine abnormality. Advanced cervical spondylosis with severe foraminal encroachment bilaterally due to spurring. Electronically Signed   By: Franchot Gallo M.D.   On: 09/19/2019 15:43    CT Chest Wo Contrast  Result Date: 09/19/2019 CLINICAL DATA:  Encephalopathy. Altered mental status. Hypoxemia. Fall. EXAM: CT CHEST WITHOUT CONTRAST TECHNIQUE: Multidetector CT imaging of the chest was performed following the standard protocol without IV contrast. COMPARISON:  Chest radiograph earlier today.  No comparison CT. FINDINGS: Cardiovascular: Degradation secondary to patient arm position, not raised above the head. There is also beam hardening artifact from left humeral shaft fixation hardware. Aortic atherosclerosis. Moderate cardiomegaly, without pericardial effusion. Median sternotomy for CABG. Mediastinum/Nodes: No mediastinal or definite hilar adenopathy, given limitations of unenhanced CT. Lungs/Pleura: No pleural fluid. Bibasilar dependent subsegmental atelectasis. Mild centrilobular emphysema. Calcified left upper lobe granuloma. Tiny right-sided pulmonary nodules, including at maximally 3 mm on 29/16 in the right lower lobe. Upper Abdomen: Pneumobilia. Normal imaged portions of the spleen, adrenal glands, left kidney. Fatty atrophy throughout the pancreas. Proximal gastric underdistention. Possible upper pole right-sided caliectasis on 61/6. Musculoskeletal: Left humeral shaft fixation. Prior median sternotomy. Multiple lower anterior right rib remote fractures. Lower cervical and midthoracic spondylosis. Moderate right hemidiaphragm elevation. IMPRESSION: 1. Mild limitations as detailed above. 2. No acute findings within the chest. 3. Right-sided pulmonary nodules of up to 3 mm. Emphysema (ICD10-J43.9). Non-contrast chest CT can be considered in 12 months, given risk factors for primary bronchogenic carcinoma. This recommendation follows the consensus statement: Guidelines for Management of Incidental  Pulmonary Nodules Detected on CT Images: From the Fleischner Society 2017; Radiology 2017; [phone]. 4. Possible mild upper pole right-sided caliectasis. Correlate with abdominal symptoms.  Consider renal ultrasound. 5.  Aortic Atherosclerosis (ICD10-I70.0). Electronically Signed   By: Abigail Miyamoto M.D.   On: 09/19/2019 15:49   CT Cervical Spine Wo Contrast  Result Date: 09/19/2019 CLINICAL DATA:  Encephalopathy EXAM: CT HEAD WITHOUT CONTRAST CT CERVICAL SPINE WITHOUT CONTRAST TECHNIQUE: Multidetector CT imaging of the head and cervical spine was performed following the standard protocol without intravenous contrast. Multiplanar CT image reconstructions of the cervical spine were also generated. COMPARISON:  CT head and cervical spine 08/15/2016 FINDINGS: CT HEAD FINDINGS Brain: No evidence of acute infarction, hemorrhage, hydrocephalus, extra-axial collection or mass lesion/mass effect. Small cyst left inferior basal ganglia unchanged. Vascular: Negative for hyperdense vessel Skull: Negative Sinuses/Orbits: Paranasal sinuses clear.  No orbital lesion. Other: Motion degraded study. CT CERVICAL SPINE FINDINGS Alignment: Normal alignment with straightening of the cervical lordosis. Skull base and vertebrae: Negative for fracture. Soft tissues and spinal canal: Negative Disc levels: Multilevel disc and facet degeneration. There is arthrodesis C5-6 which is likely surgical. Multilevel foraminal encroachment due to spurring including severe right foraminal encroachment at C2-3 and C4-5. Moderate to severe left foraminal encroachment at C3-4 and C4-5. Spondylosis and foraminal encroachment bilaterally at C6-7. Upper chest: Atherosclerotic aortic arch.  Lung apices clear. Other: None IMPRESSION: 1. No acute intracranial abnormality 2. No acute cervical spine abnormality. Advanced cervical spondylosis with severe foraminal encroachment bilaterally due to spurring. Electronically Signed   By: Franchot Gallo M.D.   On: 09/19/2019 15:43   DG Chest Portable 1 View  Result Date: 09/19/2019 CLINICAL DATA:  Short of breath, fell EXAM: PORTABLE CHEST 1 VIEW COMPARISON:  09/10/2019 FINDINGS: Single frontal view  of the chest demonstrates stable postsurgical changes from median sternotomy. The cardiac silhouette is unremarkable. No airspace disease, effusion, or pneumothorax. No acute bony abnormalities. IMPRESSION: 1. Stable exam, no acute process. Electronically Signed   By: Randa Ngo M.D.   On: 09/19/2019 14:58   DG Knee Complete 4 Views Right  Result Date: 09/19/2019 CLINICAL DATA:  [place] EXAM: RIGHT KNEE - COMPLETE 4+ VIEW COMPARISON:  None. FINDINGS: Frontal, bilateral oblique, lateral views of the right knee are obtained. No fracture, subluxation, or dislocation. 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 No joint effusion. IMPRESSION: 1. No acute bony abnormality. Electronically Signed   By: Randa Ngo M.D.   On: 09/19/2019 14:57     Medical Consultants:    None.  Anti-Infectives:   none  Subjective:    LILLIEANN PAVLICH she still dizzy upon standing  Objective:    Vitals:   09/20/19 0525 09/20/19 0526 09/20/19 0527 09/20/19 0530  BP: (!) 142/53 124/63 (!) 91/56 117/74  Pulse: 75 79 84 92  Resp:      Temp:      TempSrc:      SpO2:      Weight:      Height:       SpO2: 97 % O2 Flow Rate (L/min): 3 L/min FiO2 (%): 40 %   Intake/Output Summary (Last 24 hours) at 09/20/2019 0710 Last data filed at 09/20/2019 0600 Gross per 24 hour  Intake 360 ml  Output 351 ml  Net 9 ml   Filed Weights   09/19/19 2015 09/20/19 0126  Weight: 57.4 kg 57.7 kg    Exam: General exam: In no acute distress. Respiratory system: Good  air movement and clear to auscultation. Cardiovascular system: S1 & S2 heard, RRR. No JVD. Gastrointestinal system: Abdomen is nondistended, soft and nontender.  Extremities: No pedal edema. Skin: No rashes, lesions or ulcers Psychiatry: Judgement and insight appear normal. Mood & affect appropriate.    Data Reviewed:    Labs: Basic Metabolic Panel: Recent Labs  Lab 09/19/19 1432 09/19/19 1432  09/19/19 1544 09/20/19 0446  NA 139  --  141 139  K 4.7   < > 4.7 3.8  CL 102  --   --  105  CO2 26  --   --  23  GLUCOSE 87  --   --  77  BUN 37*  --   --  29*  CREATININE 1.96*  --   --  1.30*  CALCIUM 8.2*  --   --  8.6*   < > = values in this interval not displayed.   GFR Estimated Creatinine Clearance: 34.3 mL/min (A) (by C-G formula based on SCr of 1.3 mg/dL (H)). Liver Function Tests: Recent Labs  Lab 09/19/19 1432  AST 25  ALT 15  ALKPHOS 65  BILITOT 0.6  PROT 5.6*  ALBUMIN 2.5*   No results for input(s): LIPASE, AMYLASE in the last 168 hours. No results for input(s): AMMONIA in the last 168 hours. Coagulation profile No results for input(s): INR, PROTIME in the last 168 hours. COVID-19 Labs  No results for input(s): DDIMER, FERRITIN, LDH, CRP in the last 72 hours.  Lab Results  Component Value Date   SARSCOV2NAA NEGATIVE 09/19/2019   [place] NEGATIVE 09/10/2019   [place] NEGATIVE 07/21/2019   [place] NEGATIVE 07/10/2019    CBC: Recent Labs  Lab 09/19/19 1432 09/19/19 1544 09/20/19 0446  WBC 3.9*  --  4.1  NEUTROABS 3.1  --   --   HGB 9.0* 9.9* 10.2*  HCT 31.2* 29.0* 34.1*  MCV 95.4  --  95.0  PLT 203  --  197   Cardiac Enzymes: Recent Labs  Lab 09/19/19 1702  CKTOTAL 69   BNP (last 3 results) No results for input(s): PROBNP in the last 8760 hours. CBG: Recent Labs  Lab 09/19/19 1532  GLUCAP 81   D-Dimer: No results for input(s): DDIMER in the last 72 hours. Hgb A1c: No results for input(s): HGBA1C in the last 72 hours. Lipid Profile: No results for input(s): CHOL, HDL, LDLCALC, TRIG, CHOLHDL, LDLDIRECT in the last 72 hours. Thyroid function studies: No results for input(s): TSH, T4TOTAL, T3FREE, THYROIDAB in the last 72 hours.  Invalid input(s): FREET3 Anemia work up: No results for input(s): VITAMINB12, FOLATE, FERRITIN, TIBC, IRON, RETICCTPCT in the last 72 hours. Sepsis Labs: Recent Labs  Lab 09/19/19 1432  09/19/19 1702 09/20/19 0446  WBC 3.9*  --  4.1  LATICACIDVEN  --  1.4  --    Microbiology Recent Results (from the past 240 hour(s))  SARS Coronavirus 2 by RT PCR (hospital order, performed in [facility] hospital lab) Nasopharyngeal Nasopharyngeal Swab     Status: None   Collection Time: 09/10/19  7:05 PM   Specimen: Nasopharyngeal Swab  Result Value Ref Range Status   SARS Coronavirus 2 NEGATIVE NEGATIVE Final    Comment: (NOTE) SARS-CoV-2 target nucleic acids are NOT DETECTED.  The SARS-CoV-2 RNA is generally detectable in upper and lower respiratory specimens during the acute phase of infection. The lowest concentration of SARS-CoV-2 viral copies this assay can detect is 250 copies / mL. A negative result does not preclude SARS-CoV-2 infection  and should not be used as the sole basis for treatment or other patient management decisions.  A negative result may occur with improper specimen collection / handling, submission of specimen other than nasopharyngeal swab, presence of viral mutation(s) within the areas targeted by this assay, and inadequate number of viral copies (<250 copies / mL). A negative result must be combined with clinical observations, patient history, and epidemiological information.  Fact Sheet for Patients:   StrictlyIdeas.no  Fact Sheet for Healthcare Providers: BankingDealers.co.za  This test is not yet approved or  cleared by the [place] FDA and has been authorized for detection and/or diagnosis of SARS-CoV-2 by FDA under an Emergency Use Authorization (EUA).  This EUA will remain in effect (meaning this test can be used) for the duration of the COVID-19 declaration under Section 564(b)(1) of the Act, 21 U.S.C. section 360bbb-3(b)(1), unless the authorization is terminated or revoked sooner.  Performed at [facility], [place] [address], [place], [place] [address]   SARS Coronavirus 2 by RT  PCR (hospital order, performed in [facility] hospital lab) Nasopharyngeal Nasopharyngeal Swab     Status: None   Collection Time: 09/19/19  3:04 PM   Specimen: Nasopharyngeal Swab  Result Value Ref Range Status   SARS Coronavirus 2 NEGATIVE NEGATIVE Final    Comment: (NOTE) SARS-CoV-2 target nucleic acids are NOT DETECTED.  The SARS-CoV-2 RNA is generally detectable in upper and lower respiratory specimens during the acute phase of infection. The lowest concentration of SARS-CoV-2 viral copies this assay can detect is 250 copies / mL. A negative result does not preclude SARS-CoV-2 infection and should not be used as the sole basis for treatment or other patient management decisions.  A negative result may occur with improper specimen collection / handling, submission of specimen other than nasopharyngeal swab, presence of viral mutation(s) within the areas targeted by this assay, and inadequate number of viral copies (<250 copies / mL). A negative result must be combined with clinical observations, patient history, and epidemiological information.  Fact Sheet for Patients:   StrictlyIdeas.no  Fact Sheet for Healthcare Providers: BankingDealers.co.za  This test is not yet approved or  cleared by the [place] FDA and has been authorized for detection and/or diagnosis of SARS-CoV-2 by FDA under an Emergency Use Authorization (EUA).  This EUA will remain in effect (meaning this test can be used) for the duration of the COVID-19 declaration under Section 564(b)(1) of the Act, 21 U.S.C. section 360bbb-3(b)(1), unless the authorization is terminated or revoked sooner.  Performed at [facility], [place] [address], [place], [place] [address]   MRSA PCR Screening     Status: None   Collection Time: 09/19/19  9:18 PM   Specimen: Nasal Mucosa; Nasopharyngeal  Result Value Ref Range Status   MRSA by PCR NEGATIVE NEGATIVE Final     Comment:        The GeneXpert MRSA Assay (FDA approved for NASAL specimens only), is one component of a comprehensive MRSA colonization surveillance program. It is not intended to diagnose MRSA infection nor to guide or monitor treatment for MRSA infections. Performed at [facility], [place] [address] [place], [place] [address]      Medications:   . apixaban  2.5 mg Oral BID  . aspirin  81 mg Oral Daily  . atorvastatin  80 mg Oral q1800  . buPROPion  150 mg Oral Daily  . ezetimibe  10 mg Oral Daily  . ferrous sulfate  325 mg Oral BID WC  . gabapentin  100 mg Oral TID  . pantoprazole  40 mg Oral Daily  . pramipexole  0.25 mg Oral QHS  . sodium chloride flush  3 mL Intravenous Q12H   Continuous Infusions:    LOS: 0 days   Charlynne Cousins  [facility]  09/20/2019, 7:10 AM

## 2019-09-20 NOTE — Progress Notes (Signed)
CBG back to  89, will continue to monitor, Thanks Buckner [place].

## 2019-09-20 NOTE — Consult Note (Signed)
   [facility] Inpatient Consult   09/20/2019  ANHAR MCDERMOTT [birth year] [id number]   [place] Patient:  [facility]  Patient is pending active with [facility] for chronic disease management services.  Patient has had attempts from a [facility] Management Coordinator without success.  Our community based plan of care has focused on disease management and community resource support.   Patient will receive a post hospital call and will be evaluated for assessments and disease process education.    Plan:  Inpatient Transition Of Care [TOC] team member to make aware that [facility] Management following. Of note, [facility] Care Management services does not replace or interfere with any services that are needed or arranged by inpatient [facility] care management team.  For additional questions or referrals please contact:  Natividad Brood, RN BSN [facility] Liaison  [phone] business mobile phone Toll free office [phone]  Fax number: [phone] [email or web address]

## 2019-09-20 NOTE — Evaluation (Signed)
Physical Therapy Evaluation Patient Details Name: Wendy [name] MRN: [id number] DOB: 1950-04-30 Today's Date: 09/20/2019   History of Present Illness  SANDEEP DELAGARZA is an 69 y.o. female past medical history significant for ischemic cardiomyopathy, chronic systolic heart failure with an EF of 45%, essential hypertension paroxysmal atrial fibrillation on Eliquis, status post CABG who comes in for acute encephalopathy.  Patient was recently discharged on 09/13/2019 for heart failure medications were adjusted Entresto was replaced with ARB and HCTZ, since then she has been complaining of lightheadedness and weakness.  Of admission she was restarted taking her depression medication and she started feeling lightheaded and fell to the floor without loss of consciousness.  Clinical Impression  Pt admitted with above diagnosis. Pt was able to ambulate with min assist with occasional LOB due to poor safety needing steadying assist with RW.  Pt reports falls in home over the last few months. If she does nothave 24 hour care, recommend SNF.  If she has assist, HH appropriate. Will follow acutely.  Pt was not orthostatic. Other VSS.  Pt currently with functional limitations due to the deficits listed below (see PT Problem List). Pt will benefit from skilled PT to increase their independence and safety with mobility to allow discharge to the venue listed below.     Supine 120/67, 73 bpm  Sitting 120/68 , 81 bpm  Standing 126/98, 81 bpm  Standing after 3 min 141/81, 88 bpm     Follow Up Recommendations Home health PT;Supervision/Assistance - 24 hour (SNF if pt does not have 24 hour care)    Equipment Recommendations  None recommended by PT    Recommendations for Other Services       Precautions / Restrictions Precautions Precautions: Fall Restrictions Weight Bearing Restrictions: No      Mobility  Bed Mobility Overal bed mobility: Independent                Transfers Overall transfer  level: Needs assistance Equipment used: Rolling walker (2 wheeled) Transfers: Sit to/from Stand Sit to Stand: Min guard         General transfer comment: cues for hand placement and steadying assist to power up as pt with weakness  Ambulation/Gait Ambulation/Gait assistance: Min assist;Min guard Gait Distance (Feet): 45 Feet Assistive device: Rolling walker (2 wheeled) Gait Pattern/deviations: Step-through pattern;Decreased stride length;Drifts right/left;Trunk flexed;Wide base of support;Shuffle   Gait velocity interpretation: <1.31 ft/sec, indicative of household ambulator General Gait Details: Pt had some shuffling gait needing cues for safety as well as cues tos tay close to RW and steer RW.   Stairs            Wheelchair Mobility    Modified Rankin (Stroke Patients Only)       Balance Overall balance assessment: Needs assistance Sitting-balance support: No upper extremity supported;Feet supported Sitting balance-Leahy Scale: Fair     Standing balance support: Bilateral upper extremity supported;During functional activity Standing balance-Leahy Scale: Poor Standing balance comment: relies on UE support on RW.                              Pertinent Vitals/Pain Pain Assessment: Faces Faces Pain Scale: Hurts little more Pain Location: stomach Pain Descriptors / Indicators: Nagging Pain Intervention(s): Limited activity within patient's tolerance;Monitored during session;Repositioned    Home Living Family/patient expects to be discharged to:: Private residence Living Arrangements: Other relatives Available Help at Discharge: Family;Available 24 hours/day Type of Home:  Mobile home Home Access: Ramped entrance     Home Layout: One level Home Equipment: [place] - 2 wheels;Walker - 4 wheels;Cane - quad;Bedside commode      Prior Function Level of Independence: Independent with assistive device(s);Needs assistance   Gait / Transfers Assistance  Needed: used cane most of the time, walker doesnt fit around the house well.  Alot of time family is walking with pt for safety  ADL's / Homemaking Assistance Needed: B/D self  Comments: multiple falls, using cane, most recent 3 weeks ago coming out of bathroom.     Hand Dominance   Dominant Hand: Right    Extremity/Trunk Assessment   Upper Extremity Assessment Upper Extremity Assessment: Defer to OT evaluation    Lower Extremity Assessment Lower Extremity Assessment: Generalized weakness    Cervical / Trunk Assessment Cervical / Trunk Assessment: Normal  Communication   Communication: No difficulties  Cognition Arousal/Alertness: Lethargic;Suspect due to medications Behavior During Therapy: Flat affect Overall Cognitive Status: Within Functional Limits for tasks assessed                                        General Comments      Exercises     Assessment/Plan    PT Assessment Patient needs continued PT services  PT Problem List Decreased activity tolerance;Decreased balance;Decreased mobility;Decreased knowledge of use of DME;Decreased safety awareness;Decreased knowledge of precautions       PT Treatment Interventions Gait training;Functional mobility training;Therapeutic activities;Therapeutic exercise;Balance training;Patient/family education    PT Goals (Current goals can be found in the Care Plan section)  Acute Rehab PT Goals Patient Stated Goal: to go home PT Goal Formulation: With patient Time For Goal Achievement: 10/04/19 Potential to Achieve Goals: Good    Frequency Min 3X/week   Barriers to discharge        Co-evaluation               AM-PAC PT "6 Clicks" Mobility  Outcome Measure Help needed turning from your back to your side while in a flat bed without using bedrails?: None Help needed moving from lying on your back to sitting on the side of a flat bed without using bedrails?: None Help needed moving to and from a  bed to a chair (including a wheelchair)?: A Little Help needed standing up from a chair using your arms (e.g., wheelchair or bedside chair)?: A Little Help needed to walk in hospital room?: A Little Help needed climbing 3-5 steps with a railing? : A Little 6 Click Score: 20    End of Session Equipment Utilized During Treatment: Gait belt Activity Tolerance: Patient limited by fatigue Patient left: in chair;with call bell/phone within reach;with chair alarm set Nurse Communication: Mobility status PT Visit Diagnosis: Muscle weakness (generalized) (M62.81);Unsteadiness on feet (R26.81)    Time: 7673-4193 PT Time Calculation (min) (ACUTE ONLY): 17 min   Charges:   PT Evaluation $PT Eval Moderate Complexity: 1 Mod          Gamaliel Charney W,PT Acute Rehabilitation Services Pager:  [phone]  Office:  [place] 09/20/2019, 2:58 PM

## 2019-09-20 NOTE — Progress Notes (Signed)
CBG 67, no s/s, pt given cranberry juice, MD notified, will recheck in a little bit, Thanks Buckner [place].

## 2019-09-20 NOTE — Progress Notes (Signed)
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

## 2019-09-20 NOTE — Progress Notes (Signed)
Ortho BP: lying 142/53, HR 75                  Sitting 124/63, HR 79                  Standing 91/56, HR 84                  Standing 3 min 117/74, HR 92 Thanks Buckner Malta.

## 2019-09-20 NOTE — Progress Notes (Signed)
PT note Orthostatic BPs  Supine 120/67, 73 bpm  Sitting 120/68 , 81 bpm  Standing 126/98, 81 bpm  Standing after 3 min 141/81, 88 bpm  Wendy [name] W,PT Acute Rehabilitation Services Pager:  [phone]  Office:  [phone]

## 2019-09-21 LAB — BASIC METABOLIC PANEL
Anion gap: 10 (ref 5–15)
BUN: 20 mg/dL (ref 8–23)
CO2: 24 mmol/L (ref 22–32)
Calcium: 8.4 mg/dL — ABNORMAL LOW (ref 8.9–10.3)
Chloride: 104 mmol/L (ref 98–111)
Creatinine, Ser: 0.93 mg/dL (ref 0.44–1.00)
GFR calc Af Amer: >60 mL/min (ref >60)
GFR calc non Af Amer: >60 mL/min (ref >60)
Glucose, Bld: 87 mg/dL (ref 70–99)
Potassium: 4.1 mmol/L (ref 3.5–5.1)
Sodium: 138 mmol/L (ref 135–145)

## 2019-09-21 LAB — GLUCOSE, CAPILLARY: Glucose-Capillary: 73 mg/dL (ref 70–99)

## 2019-09-21 MED ORDER — SODIUM CHLORIDE 0.9 % IV SOLN: INTRAVENOUS | Status: DC

## 2019-09-21 NOTE — Progress Notes (Signed)
[facility] PROGRESS NOTE    Progress Note  IVYROSE HASHMAN  WLS:[id number] DOB: 1950-11-27 DOA: 09/19/2019 PCP: Josetta Huddle, MD     Brief Narrative:   LENNETTE FADER is an 69 y.o. female past medical history significant for ischemic cardiomyopathy, chronic systolic heart failure with an EF of 45%, essential hypertension paroxysmal atrial fibrillation on Eliquis, status post CABG who comes in for acute encephalopathy.  Patient was recently discharged on 09/13/2019 for heart failure medications were adjusted Entresto was replaced with ARB and HCTZ, since then she has been complaining of lightheadedness and weakness.  Of admission she was restarted taking her depression medication and she started feeling lightheaded and fell to the floor without loss of consciousness.  Assessment/Plan:   Acute metabolic encephalopathy: Concern about Entresto causing hypotension leading to near syncope and confusion. Diuretic therapy and Delene Loll were held on admission. Blood pressure is slowly improving.  Encephalopathy has resolved.  Acute kidney injury: With a baseline creatinine of less than 1, likely prerenal azotemia, her creatinine is 1.3. Basic metabolic panel is pending this morning, cont  to to hold Entresto and diuretic therapy.  Orthostatic hypotension/ Syncope: Likely due to hypovolemia in the setting of changing of her medication. She will probably need to go home off hydralazine.  Chronic systolic heart failure: Orthostatic vitals have not been checked, she was hypokalemic on admission. Not on a beta-blocker at home, still holding Lasix, Entresto and Aldactone.  Her basic metabolic panel is pending this morning.  Paroxysmal atrial fibrillation: Continue amiodarone and Eliquis.  Chronic normocytic anemia: Hemoglobin is stable.  Acute respiratory failure with hypoxia: Of unclear etiology CT reassuring no signs of heart failure or pneumonia unlikely PE she is not tachycardic  or hypoxic anymore.  Chronic back pain: Resume narcotics.  DVT prophylaxis: apixiban Family Communication:none Status is: Observation  The patient will require care spanning > 2 midnights and should be moved to inpatient because: Hemodynamically unstable  Dispo: The patient is from: Home              Anticipated d/c is to: Home              Anticipated d/c date is: 2 days              Patient currently is not medically stable to d/c.   Code Status:     Code Status Orders  (From admission, onward)         Start     Ordered   09/19/19 1841  Full code  Continuous        09/19/19 1842        Code Status History    Date Active Date Inactive Code Status Order ID Comments User Context   09/10/2019 1504 09/13/2019 1923 Full Code [id number]  Norval Morton, MD ED   07/21/2019 1824 07/23/2019 2152 Full Code [id number]  Elam Dutch, MD ED   06/18/2019 1633 07/12/2019 2358 Full Code [id number]  Iline Oven Inpatient   06/05/2019 1349 06/05/2019 2233 Full Code [id number]  Burnell Blanks, MD Inpatient   04/29/2019 1144 04/29/2019 2258 Full Code [id number]  Waynetta Sandy, MD Inpatient   08/22/2017 2327 08/25/2017 1736 Full Code [id number]  Prudencio Burly III, PA-C Inpatient   08/15/2016 1617 08/16/2016 2111 Full Code [id number]  Verlee Monte, MD Inpatient   07/22/2016 1810 07/26/2016 1642 Full Code [id number]  Erlene Quan, PA-C ED   07/19/2016 1641 07/20/2016 1723 Full Code [id number]  Martensen,  Charna Elizabeth III, PA-C Inpatient   01/22/2015 1209 01/23/2015 1418 Full Code [id number]  Troy Sine, MD Inpatient   01/21/2015 2014 01/22/2015 1209 Full Code [id number]  Erma Heritage, [place] Inpatient   05/12/2014 0517 05/12/2014 2333 Full Code [id number]  Theressa Millard, MD Inpatient   10/17/2013 2006 10/22/2013 1735 Full Code [id number]  Rush Landmark Inpatient   09/15/2013 2120 09/21/2013 2125 Full Code [id number]  Bynum Bellows, MD Inpatient   08/27/2013  1538 08/28/2013 1522 Full Code [id number]  Leone Payor Inpatient   07/19/2013 2307 07/20/2013 1245 Full Code [id number]  Reola Mosher Inpatient   07/19/2013 1741 07/19/2013 2307 Full Code [id number]  Leonie Man, MD Inpatient   Advance Care Planning Activity        IV Access:    Peripheral IV   Procedures and diagnostic studies:   X-ray chest PA and lateral  Result Date: 09/20/2019 CLINICAL DATA:  CHF EXAM: CHEST - 2 VIEW COMPARISON:  09/19/2019 FINDINGS: Normal heart size. CABG and coronary stenting. There is no edema, consolidation, effusion, or pneumothorax. Postoperative left humeral shaft. IMPRESSION: Stable exam.  No acute finding. Electronically Signed   By: Monte Fantasia M.D.   On: 09/20/2019 09:17   CT Head Wo Contrast  Result Date: 09/19/2019 CLINICAL DATA:  Encephalopathy EXAM: CT HEAD WITHOUT CONTRAST CT CERVICAL SPINE WITHOUT CONTRAST TECHNIQUE: Multidetector CT imaging of the head and cervical spine was performed following the standard protocol without intravenous contrast. Multiplanar CT image reconstructions of the cervical spine were also generated. COMPARISON:  CT head and cervical spine 08/15/2016 FINDINGS: CT HEAD FINDINGS Brain: No evidence of acute infarction, hemorrhage, hydrocephalus, extra-axial collection or mass lesion/mass effect. Small cyst left inferior basal ganglia unchanged. Vascular: Negative for hyperdense vessel Skull: Negative Sinuses/Orbits: Paranasal sinuses clear.  No orbital lesion. Other: Motion degraded study. CT CERVICAL SPINE FINDINGS Alignment: Normal alignment with straightening of the cervical lordosis. Skull base and vertebrae: Negative for fracture. Soft tissues and spinal canal: Negative Disc levels: Multilevel disc and facet degeneration. There is arthrodesis C5-6 which is likely surgical. Multilevel foraminal encroachment due to spurring including severe right foraminal encroachment at C2-3 and C4-5. Moderate to severe left  foraminal encroachment at C3-4 and C4-5. Spondylosis and foraminal encroachment bilaterally at C6-7. Upper chest: Atherosclerotic aortic arch.  Lung apices clear. Other: None IMPRESSION: 1. No acute intracranial abnormality 2. No acute cervical spine abnormality. Advanced cervical spondylosis with severe foraminal encroachment bilaterally due to spurring. Electronically Signed   By: Franchot Gallo M.D.   On: 09/19/2019 15:43   CT Chest Wo Contrast  Result Date: 09/19/2019 CLINICAL DATA:  Encephalopathy. Altered mental status. Hypoxemia. Fall. EXAM: CT CHEST WITHOUT CONTRAST TECHNIQUE: Multidetector CT imaging of the chest was performed following the standard protocol without IV contrast. COMPARISON:  Chest radiograph earlier today.  No comparison CT. FINDINGS: Cardiovascular: Degradation secondary to patient arm position, not raised above the head. There is also beam hardening artifact from left humeral shaft fixation hardware. Aortic atherosclerosis. Moderate cardiomegaly, without pericardial effusion. Median sternotomy for CABG. Mediastinum/Nodes: No mediastinal or definite hilar adenopathy, given limitations of unenhanced CT. Lungs/Pleura: No pleural fluid. Bibasilar dependent subsegmental atelectasis. Mild centrilobular emphysema. Calcified left upper lobe granuloma. Tiny right-sided pulmonary nodules, including at maximally 3 mm on 29/16 in the right lower lobe. Upper Abdomen: Pneumobilia. Normal imaged portions of the spleen, adrenal glands, left kidney. Fatty atrophy throughout the pancreas. Proximal gastric underdistention. Possible upper pole right-sided caliectasis on  61/6. Musculoskeletal: Left humeral shaft fixation. Prior median sternotomy. Multiple lower anterior right rib remote fractures. Lower cervical and midthoracic spondylosis. Moderate right hemidiaphragm elevation. IMPRESSION: 1. Mild limitations as detailed above. 2. No acute findings within the chest. 3. Right-sided pulmonary nodules of  up to 3 mm. Emphysema (ICD10-J43.9). Non-contrast chest CT can be considered in 12 months, given risk factors for primary bronchogenic carcinoma. This recommendation follows the consensus statement: Guidelines for Management of Incidental Pulmonary Nodules Detected on CT Images: From the Fleischner Society 2017; Radiology 2017; 284:228-243. 4. Possible mild upper pole right-sided caliectasis. Correlate with abdominal symptoms. Consider renal ultrasound. 5.  Aortic Atherosclerosis (ICD10-I70.0). Electronically Signed   By: Abigail Miyamoto M.D.   On: 09/19/2019 15:49   CT Cervical Spine Wo Contrast  Result Date: 09/19/2019 CLINICAL DATA:  Encephalopathy EXAM: CT HEAD WITHOUT CONTRAST CT CERVICAL SPINE WITHOUT CONTRAST TECHNIQUE: Multidetector CT imaging of the head and cervical spine was performed following the standard protocol without intravenous contrast. Multiplanar CT image reconstructions of the cervical spine were also generated. COMPARISON:  CT head and cervical spine 08/15/2016 FINDINGS: CT HEAD FINDINGS Brain: No evidence of acute infarction, hemorrhage, hydrocephalus, extra-axial collection or mass lesion/mass effect. Small cyst left inferior basal ganglia unchanged. Vascular: Negative for hyperdense vessel Skull: Negative Sinuses/Orbits: Paranasal sinuses clear.  No orbital lesion. Other: Motion degraded study. CT CERVICAL SPINE FINDINGS Alignment: Normal alignment with straightening of the cervical lordosis. Skull base and vertebrae: Negative for fracture. Soft tissues and spinal canal: Negative Disc levels: Multilevel disc and facet degeneration. There is arthrodesis C5-6 which is likely surgical. Multilevel foraminal encroachment due to spurring including severe right foraminal encroachment at C2-3 and C4-5. Moderate to severe left foraminal encroachment at C3-4 and C4-5. Spondylosis and foraminal encroachment bilaterally at C6-7. Upper chest: Atherosclerotic aortic arch.  Lung apices clear. Other:  None IMPRESSION: 1. No acute intracranial abnormality 2. No acute cervical spine abnormality. Advanced cervical spondylosis with severe foraminal encroachment bilaterally due to spurring. Electronically Signed   By: Franchot Gallo M.D.   On: 09/19/2019 15:43   DG Chest Portable 1 View  Result Date: 09/19/2019 CLINICAL DATA:  Short of breath, fell EXAM: PORTABLE CHEST 1 VIEW COMPARISON:  09/10/2019 FINDINGS: Single frontal view of the chest demonstrates stable postsurgical changes from median sternotomy. The cardiac silhouette is unremarkable. No airspace disease, effusion, or pneumothorax. No acute bony abnormalities. IMPRESSION: 1. Stable exam, no acute process. Electronically Signed   By: Randa Ngo M.D.   On: 09/19/2019 14:58   DG Knee Complete 4 Views Right  Result Date: 09/19/2019 CLINICAL DATA:  [place] EXAM: RIGHT KNEE - COMPLETE 4+ VIEW COMPARISON:  None. FINDINGS: Frontal, bilateral oblique, lateral views of the right knee are obtained. No fracture, subluxation, or dislocation. 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 No joint effusion. IMPRESSION: 1. No acute bony abnormality. Electronically Signed   By: Randa Ngo M.D.   On: 09/19/2019 14:57     Medical Consultants:    None.  Anti-Infectives:   none  Subjective:    DORTHULA BIER she relates she still dizzy upon standing.  Objective:    Vitals:   09/20/19 2024 09/21/19 0124 09/21/19 0249 09/21/19 0253  BP: (!) 101/54 (!) 106/56 128/68   Pulse: 82 79 86   Resp: 17 16 16    Temp: 98.7 F (37.1 C) 97.9 F (36.6 C) 98.2 F (36.8 C)   TempSrc: Oral Oral Oral   SpO2: 97% 99% 100%  Weight:    57.2 kg  Height:       SpO2: 100 % O2 Flow Rate (L/min): 3 L/min FiO2 (%): 40 %   Intake/Output Summary (Last 24 hours) at 09/21/2019 1028 Last data filed at 09/21/2019 0657 Gross per 24 hour  Intake --  Output 200 ml  Net -200 ml   Filed Weights   09/19/19 2015  09/20/19 0126 09/21/19 0253  Weight: 57.4 kg 57.7 kg 57.2 kg    Exam: General exam: In no acute distress. Respiratory system: Good air movement and clear to auscultation. Cardiovascular system: S1 & S2 heard, RRR. No JVD. Gastrointestinal system: Abdomen is nondistended, soft and nontender.  Extremities: No pedal edema. Skin: No rashes, lesions or ulcers Psychiatry: Judgement and insight appear normal. Mood & affect appropriate.   Data Reviewed:    Labs: Basic Metabolic Panel: Recent Labs  Lab 09/19/19 1432 09/19/19 1432 09/19/19 1544 09/20/19 0446  NA 139  --  141 139  K 4.7   < > 4.7 3.8  CL 102  --   --  105  CO2 26  --   --  23  GLUCOSE 87  --   --  77  BUN 37*  --   --  29*  CREATININE 1.96*  --   --  1.30*  CALCIUM 8.2*  --   --  8.6*   < > = values in this interval not displayed.   GFR Estimated Creatinine Clearance: 31.7 mL/min (A) (by C-G formula based on SCr of 1.3 mg/dL (H)). Liver Function Tests: Recent Labs  Lab 09/19/19 1432  AST 25  ALT 15  ALKPHOS 65  BILITOT 0.6  PROT 5.6*  ALBUMIN 2.5*   No results for input(s): LIPASE, AMYLASE in the last 168 hours. No results for input(s): AMMONIA in the last 168 hours. Coagulation profile No results for input(s): INR, PROTIME in the last 168 hours. COVID-19 Labs  No results for input(s): DDIMER, FERRITIN, LDH, CRP in the last 72 hours.  Lab Results  Component Value Date   SARSCOV2NAA NEGATIVE 09/19/2019   [place] NEGATIVE 09/10/2019   [place] NEGATIVE 07/21/2019   [place] NEGATIVE 07/10/2019    CBC: Recent Labs  Lab 09/19/19 1432 09/19/19 1544 09/20/19 0446  WBC 3.9*  --  4.1  NEUTROABS 3.1  --   --   HGB 9.0* 9.9* 10.2*  HCT 31.2* 29.0* 34.1*  MCV 95.4  --  95.0  PLT 203  --  197   Cardiac Enzymes: Recent Labs  Lab 09/19/19 1702  CKTOTAL 69   BNP (last 3 results) No results for input(s): PROBNP in the last 8760 hours. CBG: Recent Labs  Lab 09/19/19 1532  09/20/19 0538 09/20/19 0606 09/21/19 0558  GLUCAP 81 67* 89 73   D-Dimer: No results for input(s): DDIMER in the last 72 hours. Hgb A1c: No results for input(s): HGBA1C in the last 72 hours. Lipid Profile: No results for input(s): CHOL, HDL, LDLCALC, TRIG, CHOLHDL, LDLDIRECT in the last 72 hours. Thyroid function studies: No results for input(s): TSH, T4TOTAL, T3FREE, THYROIDAB in the last 72 hours.  Invalid input(s): FREET3 Anemia work up: No results for input(s): VITAMINB12, FOLATE, FERRITIN, TIBC, IRON, RETICCTPCT in the last 72 hours. Sepsis Labs: Recent Labs  Lab 09/19/19 1432 09/19/19 1702 09/20/19 0446  WBC 3.9*  --  4.1  LATICACIDVEN  --  1.4  --    Microbiology Recent Results (from the past 240 hour(s))  SARS Coronavirus 2 by RT PCR (  hospital order, performed in [facility] hospital lab) Nasopharyngeal Nasopharyngeal Swab     Status: None   Collection Time: 09/19/19  3:04 PM   Specimen: Nasopharyngeal Swab  Result Value Ref Range Status   SARS Coronavirus 2 NEGATIVE NEGATIVE Final    Comment: (NOTE) SARS-CoV-2 target nucleic acids are NOT DETECTED.  The SARS-CoV-2 RNA is generally detectable in upper and lower respiratory specimens during the acute phase of infection. The lowest concentration of SARS-CoV-2 viral copies this assay can detect is 250 copies / mL. A negative result does not preclude SARS-CoV-2 infection and should not be used as the sole basis for treatment or other patient management decisions.  A negative result may occur with improper specimen collection / handling, submission of specimen other than nasopharyngeal swab, presence of viral mutation(s) within the areas targeted by this assay, and inadequate number of viral copies (<250 copies / mL). A negative result must be combined with clinical observations, patient history, and epidemiological information.  Fact Sheet for Patients:   StrictlyIdeas.no  Fact Sheet  for Healthcare Providers: BankingDealers.co.za  This test is not yet approved or  cleared by the [place] FDA and has been authorized for detection and/or diagnosis of SARS-CoV-2 by FDA under an Emergency Use Authorization (EUA).  This EUA will remain in effect (meaning this test can be used) for the duration of the COVID-19 declaration under Section 564(b)(1) of the Act, 21 U.S.C. section 360bbb-3(b)(1), unless the authorization is terminated or revoked sooner.  Performed at [facility], [place] [address], [place], [place] [address]   MRSA PCR Screening     Status: None   Collection Time: 09/19/19  9:18 PM   Specimen: Nasal Mucosa; Nasopharyngeal  Result Value Ref Range Status   MRSA by PCR NEGATIVE NEGATIVE Final    Comment:        The GeneXpert MRSA Assay (FDA approved for NASAL specimens only), is one component of a comprehensive MRSA colonization surveillance program. It is not intended to diagnose MRSA infection nor to guide or monitor treatment for MRSA infections. Performed at [facility], [place] [address], [place], [place] [address]      Medications:   . amiodarone  100 mg Oral Daily  . apixaban  5 mg Oral BID  . aspirin  81 mg Oral Daily  . atorvastatin  80 mg Oral q1800  . buPROPion  150 mg Oral Daily  . ezetimibe  10 mg Oral Daily  . ferrous sulfate  325 mg Oral BID WC  . gabapentin  100 mg Oral TID  . oxyCODONE  10 mg Oral TID  . pantoprazole  40 mg Oral Daily  . pramipexole  0.25 mg Oral QHS  . ranolazine  1,000 mg Oral BID  . sertraline  150 mg Oral QHS  . sodium chloride flush  3 mL Intravenous Q12H   Continuous Infusions:    LOS: 1 day   Charlynne Cousins  [facility]  09/21/2019, 10:28 AM

## 2019-09-22 DIAGNOSIS — D500 Iron deficiency anemia secondary to blood loss (chronic): Secondary | ICD-10-CM

## 2019-09-22 LAB — BASIC METABOLIC PANEL
Anion gap: 8 (ref 5–15)
BUN: 18 mg/dL (ref 8–23)
CO2: 24 mmol/L (ref 22–32)
Calcium: 8.3 mg/dL — ABNORMAL LOW (ref 8.9–10.3)
Chloride: 108 mmol/L (ref 98–111)
Creatinine, Ser: 0.83 mg/dL (ref 0.44–1.00)
GFR calc Af Amer: >60 mL/min (ref >60)
GFR calc non Af Amer: >60 mL/min (ref >60)
Glucose, Bld: 76 mg/dL (ref 70–99)
Potassium: 3.9 mmol/L (ref 3.5–5.1)
Sodium: 140 mmol/L (ref 135–145)

## 2019-09-22 LAB — GLUCOSE, CAPILLARY: Glucose-Capillary: 74 mg/dL (ref 70–99)

## 2019-09-22 MED ORDER — FUROSEMIDE 20 MG PO TABS
20.0000 mg | ORAL_TABLET | Freq: Every day | ORAL | Status: DC
  Administered 2019-09-22: 20 mg via ORAL
  Filled 2019-09-22: qty 1

## 2019-09-22 MED ORDER — SPIRONOLACTONE 25 MG PO TABS
25.0000 mg | ORAL_TABLET | Freq: Every day | ORAL | Status: DC
  Filled 2019-09-22: qty 1

## 2019-09-22 MED ORDER — FUROSEMIDE 20 MG PO TABS: 20.0000 mg | ORAL_TABLET | ORAL | 3 refills | Status: DC

## 2019-09-22 MED ORDER — SACUBITRIL-VALSARTAN 24-26 MG PO TABS
1.0000 | ORAL_TABLET | Freq: Two times a day (BID) | ORAL | Status: DC
  Administered 2019-09-22: 1 via ORAL
  Filled 2019-09-22: qty 1

## 2019-09-22 NOTE — Plan of Care (Signed)
?  Problem: Coping: ?Goal: Level of anxiety will decrease ?Outcome: Progressing ?  ?Problem: Safety: ?Goal: Ability to remain free from injury will improve ?Outcome: Progressing ?  ?

## 2019-09-22 NOTE — Plan of Care (Signed)

## 2019-09-22 NOTE — Discharge Summary (Addendum)
Physician Discharge Summary  ELEASE [name] ZDG:[id number] DOB: 29-Jun-1950 DOA: 09/19/2019  PCP: Wendy Huddle, MD  Admit date: 09/19/2019 Discharge date: 09/22/2019  Admitted From: Home isposition:  Home  Recommendations for Outpatient Follow-up:  1. Follow up with PCP in 1-2 weeks 2. Please obtain BMP/CBC in one week 3. Physical therapy to follow-up at home.  Home Health:Yes Equipment/Devices:None  Discharge Condition:Stable CODE STATUS:Full Diet recommendation: Heart Healthy  Brief/Interim Summary: 69 y.o. female past medical history significant for ischemic cardiomyopathy, chronic systolic heart failure with an EF of 45%, essential hypertension paroxysmal atrial fibrillation on Eliquis, status post CABG who comes in for acute encephalopathy.  Patient was recently discharged on 09/13/2019 for heart failure medications were adjusted Entresto was replaced with ARB and HCTZ, since then she has been complaining of lightheadedness and weakness.  Of admission she was restarted taking her depression medication and she started feeling lightheaded and fell to the floor without loss of consciousness  Discharge Diagnoses:  Active Problems:   Acute metabolic encephalopathy   Syncope   AKI (acute kidney injury) ([place])  Acute metabolic encephalopathy: Likely due to hypotension in the setting of antihypertensive medication, acute kidney injury and psychotropic medication.  Acute kidney injury: With a baseline creatinine of less than 1 likely prerenal azotemia in the setting of hypotension she was fluid resuscitated and her creatinine returned to baseline.  Orthostatic hypotension/syncope: Likely due to being overmedicated her Aldactone and hydralazine have been discontinued.  Chronic systolic heart failure: Attic vitals were positive on admission Lasix Entresto and Aldactone and hydralazine were held on admission she was fluid resuscitated once her blood pressure improved she was restarted on  Lasix and Entresto. Aldactone and hydralazine were discontinued. She will follow up with cardiology and PCP as an outpatient titrate medications as tolerated.  Paroxysmal atrial fibrillation: No changes made to her medication.  Chronic normocytic anemia: Hemoglobin is stable.  Acute respiratory failure with hypoxia: Of unclear etiology CT reassuring showed no PE heart failure or pneumonia she is not tachycardic after fluid resuscitation her respiration improved.   Discharge Instructions  Discharge Instructions    Diet - low sodium heart healthy   Complete by: As directed    Increase activity slowly   Complete by: As directed    No wound care   Complete by: As directed      Allergies as of 09/22/2019      Reactions   Benadryl [diphenhydramine] Shortness Of Breath   Penicillins Diarrhea   Did it involve swelling of the face/tongue/throat, SOB, or low BP? No Did it involve sudden or severe rash/hives, skin peeling, or any reaction on the inside of your mouth or nose? No Did you need to seek medical attention at a hospital or doctor's office? No When did it last happen?2 months If all above answers are "NO", may proceed with cephalosporin use.   Adhesive [tape] Other (See Comments)   Burning of skin   Amoxicillin Diarrhea, Other (See Comments)   Has patient had a PCN reaction causing immediate rash, facial/tongue/throat swelling, SOB or lightheadedness with hypotension: No Has patient had a PCN reaction causing severe rash involving mucus membranes or skin necrosis: No Has patient had a PCN reaction that required hospitalization No Has patient had a PCN reaction occurring within the last 10 years: Yes -- noted rxn as diarrhea If all of the above answers are "NO", then may proceed with Cephalosporin use.   Cyclobenzaprine Other (See Comments)   Causes restless leg syndrome Restless  syndrome   Hydrocodone Itching      Medication List    STOP taking these medications    spironolactone 25 MG tablet Commonly known as: ALDACTONE     TAKE these medications   acetaminophen 500 MG tablet Commonly known as: TYLENOL Take 500-1,000 mg by mouth every 6 (six) hours as needed (for pain.).   albuterol 108 (90 Base) MCG/ACT inhaler Commonly known as: VENTOLIN HFA Inhale 2 puffs into the lungs every 6 (six) hours as needed for wheezing or shortness of breath.   ALPRAZolam 0.5 MG tablet Commonly known as: XANAX Take 0.5 mg by mouth 3 (three) times daily as needed for anxiety.   amiodarone 100 MG tablet Commonly known as: PACERONE Take 1 tablet (100 mg total) by mouth daily.   apixaban 5 MG Tabs tablet Commonly known as: Eliquis Take 1 tablet (5 mg total) by mouth 2 (two) times daily.   aspirin 81 MG chewable tablet Chew 1 tablet (81 mg total) by mouth daily.   atorvastatin 80 MG tablet Commonly known as: LIPITOR TAKE ONE TABLET BY MOUTH DAILY AT 6PM What changed:   how much to take  how to take this  when to take this   buPROPion 150 MG 24 hr tablet Commonly known as: WELLBUTRIN XL Take 150 mg by mouth daily.   diclofenac Sodium 1 % Gel Commonly known as: VOLTAREN Apply 1 g topically 4 (four) times daily as needed for pain.   ezetimibe 10 MG tablet Commonly known as: ZETIA Take 10 mg by mouth daily.   furosemide 20 MG tablet Commonly known as: LASIX Take 1 tablet (20 mg total) by mouth every other day. What changed: when to take this   gabapentin 300 MG capsule Commonly known as: NEURONTIN Take 300 mg by mouth 3 (three) times daily as needed for pain.   nitroGLYCERIN 0.4 MG SL tablet Commonly known as: NITROSTAT Place 1 tablet (0.4 mg total) under the tongue every 5 (five) minutes as needed for chest pain.   Oxycodone HCl 10 MG Tabs Take 1 tablet (10 mg total) by mouth 3 (three) times daily.   pantoprazole 40 MG tablet Commonly known as: PROTONIX Take 1 tablet (40 mg total) by mouth daily.   pramipexole 0.25 MG  tablet Commonly known as: MIRAPEX Take 0.25 mg by mouth at bedtime.   promethazine 12.5 MG tablet Commonly known as: PHENERGAN Take 1 tablet (12.5 mg total) by mouth every 6 (six) hours as needed for nausea or vomiting.   Ranexa 1000 MG SR tablet Generic drug: ranolazine Take 1,000 mg by mouth 2 (two) times daily.   sacubitril-valsartan 24-26 MG Commonly known as: ENTRESTO Take 1 tablet by mouth 2 (two) times daily.   sertraline 100 MG tablet Commonly known as: ZOLOFT Take 150 mg by mouth at bedtime.       Allergies  Allergen Reactions  . Benadryl [Diphenhydramine] Shortness Of Breath  . Penicillins Diarrhea    Did it involve swelling of the face/tongue/throat, SOB, or low BP? No Did it involve sudden or severe rash/hives, skin peeling, or any reaction on the inside of your mouth or nose? No Did you need to seek medical attention at a hospital or doctor's office? No When did it last happen?2 months If all above answers are "NO", may proceed with cephalosporin use.  . Adhesive [Tape] Other (See Comments)    Burning of skin  . Amoxicillin Diarrhea and Other (See Comments)    Has patient had a PCN  reaction causing immediate rash, facial/tongue/throat swelling, SOB or lightheadedness with hypotension: No Has patient had a PCN reaction causing severe rash involving mucus membranes or skin necrosis: No Has patient had a PCN reaction that required hospitalization No Has patient had a PCN reaction occurring within the last 10 years: Yes -- noted rxn as diarrhea If all of the above answers are "NO", then may proceed with Cephalosporin use.   . Cyclobenzaprine Other (See Comments)    Causes restless leg syndrome Restless syndrome  . Hydrocodone Itching    Consultations:  None   Procedures/Studies: X-ray chest PA and lateral  Result Date: 09/20/2019 CLINICAL DATA:  CHF EXAM: CHEST - 2 VIEW COMPARISON:  09/19/2019 FINDINGS: Normal heart size. CABG and coronary  stenting. There is no edema, consolidation, effusion, or pneumothorax. Postoperative left humeral shaft. IMPRESSION: Stable exam.  No acute finding. Electronically Signed   By: Monte Fantasia M.D.   On: 09/20/2019 09:17   CT Head Wo Contrast  Result Date: 09/19/2019 CLINICAL DATA:  Encephalopathy EXAM: CT HEAD WITHOUT CONTRAST CT CERVICAL SPINE WITHOUT CONTRAST TECHNIQUE: Multidetector CT imaging of the head and cervical spine was performed following the standard protocol without intravenous contrast. Multiplanar CT image reconstructions of the cervical spine were also generated. COMPARISON:  CT head and cervical spine 08/15/2016 FINDINGS: CT HEAD FINDINGS Brain: No evidence of acute infarction, hemorrhage, hydrocephalus, extra-axial collection or mass lesion/mass effect. Small cyst left inferior basal ganglia unchanged. Vascular: Negative for hyperdense vessel Skull: Negative Sinuses/Orbits: Paranasal sinuses clear.  No orbital lesion. Other: Motion degraded study. CT CERVICAL SPINE FINDINGS Alignment: Normal alignment with straightening of the cervical lordosis. Skull base and vertebrae: Negative for fracture. Soft tissues and spinal canal: Negative Disc levels: Multilevel disc and facet degeneration. There is arthrodesis C5-6 which is likely surgical. Multilevel foraminal encroachment due to spurring including severe right foraminal encroachment at C2-3 and C4-5. Moderate to severe left foraminal encroachment at C3-4 and C4-5. Spondylosis and foraminal encroachment bilaterally at C6-7. Upper chest: Atherosclerotic aortic arch.  Lung apices clear. Other: None IMPRESSION: 1. No acute intracranial abnormality 2. No acute cervical spine abnormality. Advanced cervical spondylosis with severe foraminal encroachment bilaterally due to spurring. Electronically Signed   By: Franchot Gallo M.D.   On: 09/19/2019 15:43   CT Chest Wo Contrast  Result Date: 09/19/2019 CLINICAL DATA:  Encephalopathy. Altered mental  status. Hypoxemia. Fall. EXAM: CT CHEST WITHOUT CONTRAST TECHNIQUE: Multidetector CT imaging of the chest was performed following the standard protocol without IV contrast. COMPARISON:  Chest radiograph earlier today.  No comparison CT. FINDINGS: Cardiovascular: Degradation secondary to patient arm position, not raised above the head. There is also beam hardening artifact from left humeral shaft fixation hardware. Aortic atherosclerosis. Moderate cardiomegaly, without pericardial effusion. Median sternotomy for CABG. Mediastinum/Nodes: No mediastinal or definite hilar adenopathy, given limitations of unenhanced CT. Lungs/Pleura: No pleural fluid. Bibasilar dependent subsegmental atelectasis. Mild centrilobular emphysema. Calcified left upper lobe granuloma. Tiny right-sided pulmonary nodules, including at maximally 3 mm on 29/16 in the right lower lobe. Upper Abdomen: Pneumobilia. Normal imaged portions of the spleen, adrenal glands, left kidney. Fatty atrophy throughout the pancreas. Proximal gastric underdistention. Possible upper pole right-sided caliectasis on 61/6. Musculoskeletal: Left humeral shaft fixation. Prior median sternotomy. Multiple lower anterior right rib remote fractures. Lower cervical and midthoracic spondylosis. Moderate right hemidiaphragm elevation. IMPRESSION: 1. Mild limitations as detailed above. 2. No acute findings within the chest. 3. Right-sided pulmonary nodules of up to 3 mm. Emphysema (ICD10-J43.9). Non-contrast chest CT  can be considered in 12 months, given risk factors for primary bronchogenic carcinoma. This recommendation follows the consensus statement: Guidelines for Management of Incidental Pulmonary Nodules Detected on CT Images: From the Fleischner Society 2017; Radiology 2017; 284:228-243. 4. Possible mild upper pole right-sided caliectasis. Correlate with abdominal symptoms. Consider renal ultrasound. 5.  Aortic Atherosclerosis (ICD10-I70.0). Electronically Signed   By:  Abigail Miyamoto M.D.   On: 09/19/2019 15:49   CT Cervical Spine Wo Contrast  Result Date: 09/19/2019 CLINICAL DATA:  Encephalopathy EXAM: CT HEAD WITHOUT CONTRAST CT CERVICAL SPINE WITHOUT CONTRAST TECHNIQUE: Multidetector CT imaging of the head and cervical spine was performed following the standard protocol without intravenous contrast. Multiplanar CT image reconstructions of the cervical spine were also generated. COMPARISON:  CT head and cervical spine 08/15/2016 FINDINGS: CT HEAD FINDINGS Brain: No evidence of acute infarction, hemorrhage, hydrocephalus, extra-axial collection or mass lesion/mass effect. Small cyst left inferior basal ganglia unchanged. Vascular: Negative for hyperdense vessel Skull: Negative Sinuses/Orbits: Paranasal sinuses clear.  No orbital lesion. Other: Motion degraded study. CT CERVICAL SPINE FINDINGS Alignment: Normal alignment with straightening of the cervical lordosis. Skull base and vertebrae: Negative for fracture. Soft tissues and spinal canal: Negative Disc levels: Multilevel disc and facet degeneration. There is arthrodesis C5-6 which is likely surgical. Multilevel foraminal encroachment due to spurring including severe right foraminal encroachment at C2-3 and C4-5. Moderate to severe left foraminal encroachment at C3-4 and C4-5. Spondylosis and foraminal encroachment bilaterally at C6-7. Upper chest: Atherosclerotic aortic arch.  Lung apices clear. Other: None IMPRESSION: 1. No acute intracranial abnormality 2. No acute cervical spine abnormality. Advanced cervical spondylosis with severe foraminal encroachment bilaterally due to spurring. Electronically Signed   By: Franchot Gallo M.D.   On: 09/19/2019 15:43   DG Chest Portable 1 View  Result Date: 09/19/2019 CLINICAL DATA:  Short of breath, fell EXAM: PORTABLE CHEST 1 VIEW COMPARISON:  09/10/2019 FINDINGS: Single frontal view of the chest demonstrates stable postsurgical changes from median sternotomy. The cardiac  silhouette is unremarkable. No airspace disease, effusion, or pneumothorax. No acute bony abnormalities. IMPRESSION: 1. Stable exam, no acute process. Electronically Signed   By: Randa Ngo M.D.   On: 09/19/2019 14:58   DG Chest Portable 1 View  Result Date: 09/10/2019 CLINICAL DATA:  Shortness of breath and chest pain EXAM: PORTABLE CHEST 1 VIEW COMPARISON:  Jul 21, 2019 FINDINGS: There is ill-defined airspace opacity in the left upper lobe. There is mild bibasilar atelectasis. Heart is upper normal in size with pulmonary vascularity normal. Patient is status post coronary artery bypass grafting. No adenopathy. There is postoperative change in the left humerus. IMPRESSION: Suspected pneumonia left upper lobe. Mild bibasilar atelectasis. Stable cardiac silhouette with postoperative changes. Followup PA and lateral chest radiographs recommended in 3-4 weeks following trial of antibiotic therapy to ensure resolution and exclude underlying malignancy. Electronically Signed   By: Lowella Grip III M.D.   On: 09/10/2019 12:55   DG Knee Complete 4 Views Right  Result Date: 09/19/2019 CLINICAL DATA:  [place] EXAM: RIGHT KNEE - COMPLETE 4+ VIEW COMPARISON:  None. FINDINGS: Frontal, bilateral oblique, lateral views of the right knee are obtained. No fracture, subluxation, or dislocation. 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 No joint effusion. IMPRESSION: 1. No acute bony abnormality. Electronically Signed   By: Randa Ngo M.D.   On: 09/19/2019 14:57   ECHOCARDIOGRAM COMPLETE  Result Date: 09/12/2019    ECHOCARDIOGRAM REPORT   Patient Name:   [facility]  Zenaida Niece Date of Exam: 09/12/2019 Medical Rec #:  [id number]        Height:       61.0 in Accession #:    [id number]       Weight:       131.8 lb Date of Birth:  Mar 23, 1950        BSA:          1.582 m Patient Age:    19 years         BP:           128/51 mmHg Patient Gender: F                HR:           59  bpm. Exam Location:  Inpatient Procedure: 2D Echo, Cardiac Doppler and Color Doppler Indications:    CHF-Acute Systolic 628.36 / O29.47  History:        Patient has prior history of Echocardiogram examinations, most                 recent 06/26/2019. CHF, CAD, Prior CABG, COPD,                 Arrythmias:non-specific ST changes, Signs/Symptoms:Chest Pain;                 Risk Factors:Sleep Apnea, Dyslipidemia, Hypertension and Former                 Smoker. GERD. PAD.  Sonographer:    Vickie Epley RDCS Referring Phys: [id number] DANIEL R BENSIMHON IMPRESSIONS  1. Left ventricular ejection fraction, by estimation, is 45 to 50%. The left ventricle has mildly decreased function. The left ventricle demonstrates regional wall motion abnormalities with basal inferior hypokinesis and inferoseptal hypokinesis. Left ventricular diastolic parameters are consistent with Grade I diastolic dysfunction (impaired relaxation).  2. Right ventricular systolic function is normal. The right ventricular size is normal. Tricuspid regurgitation signal is inadequate for assessing PA pressure.  3. The mitral valve is normal in structure. Trivial mitral valve regurgitation. No evidence of mitral stenosis.  4. The aortic valve is tricuspid. Aortic valve regurgitation is not visualized. No aortic stenosis is present.  5. The inferior vena cava is normal in size with greater than 50% respiratory variability, suggesting right atrial pressure of 3 mmHg. FINDINGS  Left Ventricle: Left ventricular ejection fraction, by estimation, is 45 to 50%. The left ventricle has mildly decreased function. The left ventricle demonstrates regional wall motion abnormalities. The left ventricular internal cavity size was normal in size. There is no left ventricular hypertrophy. Left ventricular diastolic parameters are consistent with Grade I diastolic dysfunction (impaired relaxation). Right Ventricle: The right ventricular size is normal. No increase in right  ventricular wall thickness. Right ventricular systolic function is normal. Tricuspid regurgitation signal is inadequate for assessing PA pressure. Left Atrium: Left atrial size was normal in size. Right Atrium: Right atrial size was normal in size. Pericardium: Trivial pericardial effusion is present. Mitral Valve: The mitral valve is normal in structure. Mild mitral annular calcification. Trivial mitral valve regurgitation. No evidence of mitral valve stenosis. Tricuspid Valve: The tricuspid valve is normal in structure. Tricuspid valve regurgitation is trivial. Aortic Valve: The aortic valve is tricuspid. Aortic valve regurgitation is not visualized. No aortic stenosis is present. Pulmonic Valve: The pulmonic valve was normal in structure. Pulmonic valve regurgitation is trivial. Aorta: The aortic root is normal in size and structure. Venous: The inferior vena cava is normal in  size with greater than 50% respiratory variability, suggesting right atrial pressure of 3 mmHg. IAS/Shunts: No atrial level shunt detected by color flow Doppler.  LEFT VENTRICLE PLAX 2D LVIDd:         4.80 cm      Diastology LVIDs:         3.30 cm      LV e' lateral:   5.27 cm/s LV PW:         0.80 cm      LV E/e' lateral: 14.7 LV IVS:        0.80 cm      LV e' medial:    3.41 cm/s LVOT diam:     1.80 cm      LV E/e' medial:  22.8 LV SV:         65 LV SV Index:   41 LVOT Area:     2.54 cm  LV Volumes (MOD) LV vol d, MOD A2C: 118.0 ml LV vol d, MOD A4C: 98.6 ml LV vol s, MOD A2C: 59.7 ml LV vol s, MOD A4C: 53.4 ml LV SV MOD A2C:     58.3 ml LV SV MOD A4C:     98.6 ml LV SV MOD BP:      54.1 ml RIGHT VENTRICLE RV S prime:     6.16 cm/s TAPSE (M-mode): 1.3 cm LEFT ATRIUM             Index       RIGHT ATRIUM           Index LA diam:        3.50 cm 2.21 cm/m  RA Area:     12.10 cm LA Vol (A2C):   38.4 ml 24.27 ml/m RA Volume:   25.40 ml  16.05 ml/m LA Vol (A4C):   43.7 ml 27.62 ml/m LA Biplane Vol: 43.3 ml 27.37 ml/m  AORTIC VALVE LVOT  Vmax:   125.00 cm/s LVOT Vmean:  78.800 cm/s LVOT VTI:    0.257 m  AORTA Ao Root diam: 2.70 cm MITRAL VALVE MV Area (PHT): 1.98 cm     SHUNTS MV Decel Time: 384 msec     Systemic VTI:  0.26 m MV E velocity: 77.60 cm/s   Systemic Diam: 1.80 cm MV A velocity: 103.00 cm/s MV E/A ratio:  0.75 Loralie Champagne MD Electronically signed by Loralie Champagne MD Signature Date/Time: 09/12/2019/4:36:08 PM    Final    (Echo, Carotid, EGD, Colonoscopy, ERCP)    Subjective: No complaints wants to go home.  Discharge Exam: Vitals:   09/22/19 0424 09/22/19 0913  BP: (!) 142/62 (!) 131/55  Pulse: 64 70  Resp: 16 20  Temp: (!) 97.3 F (36.3 C)   SpO2: 98% 100%   Vitals:   09/21/19 2003 09/22/19 0253 09/22/19 0424 09/22/19 0913  BP: 112/69  (!) 142/62 (!) 131/55  Pulse: 71  64 70  Resp: 20  16 20   Temp: 98.4 F (36.9 C)  (!) 97.3 F (36.3 C)   TempSrc: Oral  Oral   SpO2: 98%  98% 100%  Weight:  58 kg    Height:        General: Pt is alert, awake, not in acute distress Cardiovascular: RRR, S1/S2 +, no rubs, no gallops Respiratory: CTA bilaterally, no wheezing, no rhonchi Abdominal: Soft, NT, ND, bowel sounds + Extremities: no edema, no cyanosis    The results of significant diagnostics from this hospitalization (including imaging, microbiology, ancillary and laboratory) are  listed below for reference.     Microbiology: Recent Results (from the past 240 hour(s))  SARS Coronavirus 2 by RT PCR (hospital order, performed in [facility] hospital lab) Nasopharyngeal Nasopharyngeal Swab     Status: None   Collection Time: 09/19/19  3:04 PM   Specimen: Nasopharyngeal Swab  Result Value Ref Range Status   SARS Coronavirus 2 NEGATIVE NEGATIVE Final    Comment: (NOTE) SARS-CoV-2 target nucleic acids are NOT DETECTED.  The SARS-CoV-2 RNA is generally detectable in upper and lower respiratory specimens during the acute phase of infection. The lowest concentration of SARS-CoV-2 viral copies this assay  can detect is 250 copies / mL. A negative result does not preclude SARS-CoV-2 infection and should not be used as the sole basis for treatment or other patient management decisions.  A negative result may occur with improper specimen collection / handling, submission of specimen other than nasopharyngeal swab, presence of viral mutation(s) within the areas targeted by this assay, and inadequate number of viral copies (<250 copies / mL). A negative result must be combined with clinical observations, patient history, and epidemiological information.  Fact Sheet for Patients:   StrictlyIdeas.no  Fact Sheet for Healthcare Providers: BankingDealers.co.za  This test is not yet approved or  cleared by the [place] FDA and has been authorized for detection and/or diagnosis of SARS-CoV-2 by FDA under an Emergency Use Authorization (EUA).  This EUA will remain in effect (meaning this test can be used) for the duration of the COVID-19 declaration under Section 564(b)(1) of the Act, 21 U.S.C. section 360bbb-3(b)(1), unless the authorization is terminated or revoked sooner.  Performed at [facility], [place] [address] [place], [place] [address]   MRSA PCR Screening     Status: None   Collection Time: 09/19/19  9:18 PM   Specimen: Nasal Mucosa; Nasopharyngeal  Result Value Ref Range Status   MRSA by PCR NEGATIVE NEGATIVE Final    Comment:        The GeneXpert MRSA Assay (FDA approved for NASAL specimens only), is one component of a comprehensive MRSA colonization surveillance program. It is not intended to diagnose MRSA infection nor to guide or monitor treatment for MRSA infections. Performed at [facility], [place] [address] [place], [place] [address]      Labs: BNP (last 3 results) Recent Labs    06/19/19 1239 09/10/19 1305 09/19/19 1520  BNP 366.7* 2,476.7* 025.4*   Basic Metabolic Panel: Recent Labs  Lab  09/19/19 1432 09/19/19 1544 09/20/19 0446 09/21/19 1745 09/22/19 0608  NA 139 141 139 138 140  K 4.7 4.7 3.8 4.1 3.9  CL 102  --  105 104 108  CO2 26  --  23 24 24   GLUCOSE 87  --  77 87 76  BUN 37*  --  29* 20 18  CREATININE 1.96*  --  1.30* 0.93 0.83  CALCIUM 8.2*  --  8.6* 8.4* 8.3*   Liver Function Tests: Recent Labs  Lab 09/19/19 1432  AST 25  ALT 15  ALKPHOS 65  BILITOT 0.6  PROT 5.6*  ALBUMIN 2.5*   No results for input(s): LIPASE, AMYLASE in the last 168 hours. No results for input(s): AMMONIA in the last 168 hours. CBC: Recent Labs  Lab 09/19/19 1432 09/19/19 1544 09/20/19 0446  WBC 3.9*  --  4.1  NEUTROABS 3.1  --   --   HGB 9.0* 9.9* 10.2*  HCT 31.2* 29.0* 34.1*  MCV 95.4  --  95.0  PLT 203  --  197   Cardiac Enzymes: Recent Labs  Lab 09/19/19 1702  CKTOTAL 69   BNP: Invalid input(s): POCBNP CBG: Recent Labs  Lab 09/19/19 1532 09/20/19 0538 09/20/19 0606 09/21/19 0558 09/22/19 0426  GLUCAP 81 67* 89 73 74   D-Dimer No results for input(s): DDIMER in the last 72 hours. Hgb A1c No results for input(s): HGBA1C in the last 72 hours. Lipid Profile No results for input(s): CHOL, HDL, LDLCALC, TRIG, CHOLHDL, LDLDIRECT in the last 72 hours. Thyroid function studies No results for input(s): TSH, T4TOTAL, T3FREE, THYROIDAB in the last 72 hours.  Invalid input(s): FREET3 Anemia work up No results for input(s): VITAMINB12, FOLATE, FERRITIN, TIBC, IRON, RETICCTPCT in the last 72 hours. Urinalysis    Component Value Date/Time   COLORURINE AMBER (A) 09/19/2019 1702   APPEARANCEUR CLEAR 09/19/2019 1702   LABSPEC 1.013 09/19/2019 1702   PHURINE 5.0 09/19/2019 1702   GLUCOSEU NEGATIVE 09/19/2019 1702   HGBUR NEGATIVE 09/19/2019 1702   BILIRUBINUR NEGATIVE 09/19/2019 1702   KETONESUR NEGATIVE 09/19/2019 1702   PROTEINUR NEGATIVE 09/19/2019 1702   UROBILINOGEN 1.0 09/24/2013 1951   NITRITE NEGATIVE 09/19/2019 1702   LEUKOCYTESUR TRACE (A)  09/19/2019 1702   Sepsis Labs Invalid input(s): PROCALCITONIN,  WBC,  LACTICIDVEN Microbiology Recent Results (from the past 240 hour(s))  SARS Coronavirus 2 by RT PCR (hospital order, performed in [place] hospital lab) Nasopharyngeal Nasopharyngeal Swab     Status: None   Collection Time: 09/19/19  3:04 PM   Specimen: Nasopharyngeal Swab  Result Value Ref Range Status   SARS Coronavirus 2 NEGATIVE NEGATIVE Final    Comment: (NOTE) SARS-CoV-2 target nucleic acids are NOT DETECTED.  The SARS-CoV-2 RNA is generally detectable in upper and lower respiratory specimens during the acute phase of infection. The lowest concentration of SARS-CoV-2 viral copies this assay can detect is 250 copies / mL. A negative result does not preclude SARS-CoV-2 infection and should not be used as the sole basis for treatment or other patient management decisions.  A negative result may occur with improper specimen collection / handling, submission of specimen other than nasopharyngeal swab, presence of viral mutation(s) within the areas targeted by this assay, and inadequate number of viral copies (<250 copies / mL). A negative result must be combined with clinical observations, patient history, and epidemiological information.  Fact Sheet for Patients:   StrictlyIdeas.no  Fact Sheet for Healthcare Providers: BankingDealers.co.za  This test is not yet approved or  cleared by the [place] FDA and has been authorized for detection and/or diagnosis of SARS-CoV-2 by FDA under an Emergency Use Authorization (EUA).  This EUA will remain in effect (meaning this test can be used) for the duration of the COVID-19 declaration under Section 564(b)(1) of the Act, 21 U.S.C. section 360bbb-3(b)(1), unless the authorization is terminated or revoked sooner.  Performed at [facility], [place] [address], [place], [place] [address]   MRSA PCR Screening      Status: None   Collection Time: 09/19/19  9:18 PM   Specimen: Nasal Mucosa; Nasopharyngeal  Result Value Ref Range Status   MRSA by PCR NEGATIVE NEGATIVE Final    Comment:        The GeneXpert MRSA Assay (FDA approved for NASAL specimens only), is one component of a comprehensive MRSA colonization surveillance program. It is not intended to diagnose MRSA infection nor to guide or monitor treatment for MRSA infections. Performed at [facility],  [address] [place], [place] [address]      Time coordinating discharge: Over 30 minutes  SIGNED:   Charlynne Cousins, MD  [facility] 09/22/2019, 10:41 AM Pager   If 7PM-7AM, please contact night-coverage [email or web address] Password [email or web address]

## 2019-09-30 DIAGNOSIS — I088 Other rheumatic multiple valve diseases: Secondary | ICD-10-CM | POA: Diagnosis not present

## 2019-09-30 DIAGNOSIS — I10 Essential (primary) hypertension: Secondary | ICD-10-CM | POA: Diagnosis not present

## 2019-09-30 DIAGNOSIS — G9341 Metabolic encephalopathy: Secondary | ICD-10-CM | POA: Diagnosis not present

## 2019-09-30 DIAGNOSIS — I255 Ischemic cardiomyopathy: Secondary | ICD-10-CM | POA: Diagnosis not present

## 2019-09-30 DIAGNOSIS — I252 Old myocardial infarction: Secondary | ICD-10-CM | POA: Diagnosis not present

## 2019-09-30 DIAGNOSIS — I110 Hypertensive heart disease with heart failure: Secondary | ICD-10-CM | POA: Diagnosis not present

## 2019-09-30 DIAGNOSIS — I0981 Rheumatic heart failure: Secondary | ICD-10-CM | POA: Diagnosis not present

## 2019-09-30 DIAGNOSIS — I951 Orthostatic hypotension: Secondary | ICD-10-CM | POA: Diagnosis not present

## 2019-09-30 DIAGNOSIS — I5022 Chronic systolic (congestive) heart failure: Secondary | ICD-10-CM | POA: Diagnosis not present

## 2019-09-30 DIAGNOSIS — I25119 Atherosclerotic heart disease of native coronary artery with unspecified angina pectoris: Secondary | ICD-10-CM | POA: Diagnosis not present

## 2019-10-01 DIAGNOSIS — I25119 Atherosclerotic heart disease of native coronary artery with unspecified angina pectoris: Secondary | ICD-10-CM | POA: Diagnosis not present

## 2019-10-01 DIAGNOSIS — I110 Hypertensive heart disease with heart failure: Secondary | ICD-10-CM | POA: Diagnosis not present

## 2019-10-01 DIAGNOSIS — I252 Old myocardial infarction: Secondary | ICD-10-CM | POA: Diagnosis not present

## 2019-10-01 DIAGNOSIS — I088 Other rheumatic multiple valve diseases: Secondary | ICD-10-CM | POA: Diagnosis not present

## 2019-10-01 DIAGNOSIS — I5022 Chronic systolic (congestive) heart failure: Secondary | ICD-10-CM | POA: Diagnosis not present

## 2019-10-01 DIAGNOSIS — G9341 Metabolic encephalopathy: Secondary | ICD-10-CM | POA: Diagnosis not present

## 2019-10-01 DIAGNOSIS — I951 Orthostatic hypotension: Secondary | ICD-10-CM | POA: Diagnosis not present

## 2019-10-01 DIAGNOSIS — I0981 Rheumatic heart failure: Secondary | ICD-10-CM | POA: Diagnosis not present

## 2019-10-01 DIAGNOSIS — I255 Ischemic cardiomyopathy: Secondary | ICD-10-CM | POA: Diagnosis not present

## 2019-10-04 DIAGNOSIS — F322 Major depressive disorder, single episode, severe without psychotic features: Secondary | ICD-10-CM | POA: Diagnosis not present

## 2019-10-04 DIAGNOSIS — I10 Essential (primary) hypertension: Secondary | ICD-10-CM | POA: Diagnosis not present

## 2019-10-04 DIAGNOSIS — J449 Chronic obstructive pulmonary disease, unspecified: Secondary | ICD-10-CM | POA: Diagnosis not present

## 2019-10-04 DIAGNOSIS — I208 Other forms of angina pectoris: Secondary | ICD-10-CM | POA: Diagnosis not present

## 2019-10-04 DIAGNOSIS — I25810 Atherosclerosis of coronary artery bypass graft(s) without angina pectoris: Secondary | ICD-10-CM | POA: Diagnosis not present

## 2019-10-04 DIAGNOSIS — I480 Paroxysmal atrial fibrillation: Secondary | ICD-10-CM | POA: Diagnosis not present

## 2019-10-04 DIAGNOSIS — E785 Hyperlipidemia, unspecified: Secondary | ICD-10-CM | POA: Diagnosis not present

## 2019-10-04 DIAGNOSIS — I509 Heart failure, unspecified: Secondary | ICD-10-CM | POA: Diagnosis not present

## 2019-10-09 ENCOUNTER — Other Ambulatory Visit: Payer: Self-pay | Admitting: Internal Medicine

## 2019-10-09 ENCOUNTER — Ambulatory Visit
Admission: RE | Admit: 2019-10-09 | Discharge: 2019-10-09 | Disposition: A | Discharge status: Home / Self Care | Payer: Medicare HMO | Source: Ambulatory Visit | Attending: Internal Medicine | Admitting: Internal Medicine

## 2019-10-09 DIAGNOSIS — R1032 Left lower quadrant pain: Secondary | ICD-10-CM

## 2019-10-09 DIAGNOSIS — I509 Heart failure, unspecified: Secondary | ICD-10-CM | POA: Diagnosis not present

## 2019-10-09 DIAGNOSIS — N179 Acute kidney failure, unspecified: Secondary | ICD-10-CM | POA: Diagnosis not present

## 2019-10-09 DIAGNOSIS — M778 Other enthesopathies, not elsewhere classified: Secondary | ICD-10-CM | POA: Diagnosis not present

## 2019-10-09 DIAGNOSIS — R1030 Lower abdominal pain, unspecified: Secondary | ICD-10-CM | POA: Diagnosis not present

## 2019-10-09 DIAGNOSIS — M1612 Unilateral primary osteoarthritis, left hip: Secondary | ICD-10-CM | POA: Diagnosis not present

## 2019-10-14 ENCOUNTER — Other Ambulatory Visit: Payer: Self-pay

## 2019-10-14 NOTE — Patient Outreach (Signed)
Wendy [name]) Care Management  10/14/2019  Wendy [name] April 25, 1950 [id number]   Transition of Care Referral  Referral Date: 09/13/2019 Referral Source: [name] Liaison Date of Discharge: 09/13/2019 Facility: [place]: [facility] Medicare *PCP Office Does [facility]*  Fourth outreach attempt to patient. Spoke with patient who reported she was doing fine other than her hip was bothering her. She reports he saw PCP for follow up appt and has been referred to ortho MD and has appt this Friday. She confirms she has all her meds and no issue regarding them. She reports that her breathing (COPD and HF) have been under control. [facility] services reviewed and discussed with patient. Patient reports she does not need further education/support at this time and declined future calls/follow up. She has supportive aunt and two nephews in the home to assist her as needed. She is aware that she an call for any future needs or concerns.      Plan: RN CM will close case at this time.  Enzo Montgomery, RN,BSN,CCM [place] [facility] Telephonic Care Management Coordinator Direct Phone: [phone] Toll Free: [phone] Fax: [phone]

## 2019-10-15 ENCOUNTER — Ambulatory Visit: Payer: Self-pay

## 2019-10-18 DIAGNOSIS — M5416 Radiculopathy, lumbar region: Secondary | ICD-10-CM | POA: Diagnosis not present

## 2019-10-18 DIAGNOSIS — M25552 Pain in left hip: Secondary | ICD-10-CM | POA: Diagnosis not present

## 2019-10-18 DIAGNOSIS — M25551 Pain in right hip: Secondary | ICD-10-CM | POA: Diagnosis not present

## 2019-10-21 DIAGNOSIS — I480 Paroxysmal atrial fibrillation: Secondary | ICD-10-CM | POA: Diagnosis not present

## 2019-10-21 DIAGNOSIS — I25810 Atherosclerosis of coronary artery bypass graft(s) without angina pectoris: Secondary | ICD-10-CM | POA: Diagnosis not present

## 2019-10-21 DIAGNOSIS — I509 Heart failure, unspecified: Secondary | ICD-10-CM | POA: Diagnosis not present

## 2019-10-21 DIAGNOSIS — R1030 Lower abdominal pain, unspecified: Secondary | ICD-10-CM | POA: Diagnosis not present

## 2019-10-21 DIAGNOSIS — J449 Chronic obstructive pulmonary disease, unspecified: Secondary | ICD-10-CM | POA: Diagnosis not present

## 2019-10-23 DIAGNOSIS — M25552 Pain in left hip: Secondary | ICD-10-CM | POA: Diagnosis not present

## 2019-11-02 ENCOUNTER — Other Ambulatory Visit: Payer: Self-pay | Admitting: Cardiovascular Disease

## 2019-11-05 DIAGNOSIS — F322 Major depressive disorder, single episode, severe without psychotic features: Secondary | ICD-10-CM | POA: Diagnosis not present

## 2019-11-05 DIAGNOSIS — I509 Heart failure, unspecified: Secondary | ICD-10-CM | POA: Diagnosis not present

## 2019-11-05 DIAGNOSIS — I208 Other forms of angina pectoris: Secondary | ICD-10-CM | POA: Diagnosis not present

## 2019-11-05 DIAGNOSIS — J449 Chronic obstructive pulmonary disease, unspecified: Secondary | ICD-10-CM | POA: Diagnosis not present

## 2019-11-05 DIAGNOSIS — E785 Hyperlipidemia, unspecified: Secondary | ICD-10-CM | POA: Diagnosis not present

## 2019-11-05 DIAGNOSIS — I480 Paroxysmal atrial fibrillation: Secondary | ICD-10-CM | POA: Diagnosis not present

## 2019-11-05 DIAGNOSIS — I25810 Atherosclerosis of coronary artery bypass graft(s) without angina pectoris: Secondary | ICD-10-CM | POA: Diagnosis not present

## 2019-11-05 DIAGNOSIS — I10 Essential (primary) hypertension: Secondary | ICD-10-CM | POA: Diagnosis not present

## 2019-11-06 ENCOUNTER — Other Ambulatory Visit: Payer: Self-pay | Admitting: Vascular Surgery

## 2019-11-12 DIAGNOSIS — M5416 Radiculopathy, lumbar region: Secondary | ICD-10-CM | POA: Diagnosis not present

## 2019-12-05 DIAGNOSIS — I208 Other forms of angina pectoris: Secondary | ICD-10-CM | POA: Diagnosis not present

## 2019-12-05 DIAGNOSIS — E785 Hyperlipidemia, unspecified: Secondary | ICD-10-CM | POA: Diagnosis not present

## 2019-12-05 DIAGNOSIS — F322 Major depressive disorder, single episode, severe without psychotic features: Secondary | ICD-10-CM | POA: Diagnosis not present

## 2019-12-05 DIAGNOSIS — I10 Essential (primary) hypertension: Secondary | ICD-10-CM | POA: Diagnosis not present

## 2019-12-05 DIAGNOSIS — I509 Heart failure, unspecified: Secondary | ICD-10-CM | POA: Diagnosis not present

## 2019-12-05 DIAGNOSIS — J449 Chronic obstructive pulmonary disease, unspecified: Secondary | ICD-10-CM | POA: Diagnosis not present

## 2019-12-05 DIAGNOSIS — I480 Paroxysmal atrial fibrillation: Secondary | ICD-10-CM | POA: Diagnosis not present

## 2019-12-05 DIAGNOSIS — I25810 Atherosclerosis of coronary artery bypass graft(s) without angina pectoris: Secondary | ICD-10-CM | POA: Diagnosis not present

## 2019-12-06 ENCOUNTER — Other Ambulatory Visit: Payer: Self-pay

## 2019-12-06 ENCOUNTER — Telehealth: Payer: Self-pay | Admitting: Cardiovascular Disease

## 2019-12-06 MED ORDER — SACUBITRIL-VALSARTAN 24-26 MG PO TABS: 1.0000 | ORAL_TABLET | Freq: Two times a day (BID) | ORAL | 1 refills | Status: DC

## 2019-12-06 NOTE — Telephone Encounter (Signed)
   *  STAT* If patient is at the pharmacy, call can be transferred to refill team.   1. Which medications need to be refilled? (please list name of each medication and dose if known)   sacubitril-valsartan (ENTRESTO) 24-26 MG     2. Which pharmacy/location (including street and city if local pharmacy) is medication to be sent to?[facility] - [place], [place] - [place] [address]  3. Do they need a 30 day or 90 day supply? 90 days   Pt need it today

## 2020-01-02 ENCOUNTER — Telehealth: Payer: Self-pay | Admitting: Cardiovascular Disease

## 2020-01-02 DIAGNOSIS — E785 Hyperlipidemia, unspecified: Secondary | ICD-10-CM | POA: Diagnosis not present

## 2020-01-02 DIAGNOSIS — I25810 Atherosclerosis of coronary artery bypass graft(s) without angina pectoris: Secondary | ICD-10-CM | POA: Diagnosis not present

## 2020-01-02 DIAGNOSIS — F322 Major depressive disorder, single episode, severe without psychotic features: Secondary | ICD-10-CM | POA: Diagnosis not present

## 2020-01-02 DIAGNOSIS — J449 Chronic obstructive pulmonary disease, unspecified: Secondary | ICD-10-CM | POA: Diagnosis not present

## 2020-01-02 DIAGNOSIS — I10 Essential (primary) hypertension: Secondary | ICD-10-CM | POA: Diagnosis not present

## 2020-01-02 DIAGNOSIS — I509 Heart failure, unspecified: Secondary | ICD-10-CM | POA: Diagnosis not present

## 2020-01-02 MED ORDER — ATORVASTATIN CALCIUM 80 MG PO TABS: 80.0000 mg | ORAL_TABLET | Freq: Every day | ORAL | 1 refills | Status: DC

## 2020-01-02 NOTE — Telephone Encounter (Signed)
*  STAT* If patient is at the pharmacy, call can be transferred to refill team.   1. Which medications need to be refilled? (please list name of each medication and dose if known) atorvastatin (LIPITOR) 80 MG tablet  2. Which pharmacy/location (including street and city if local pharmacy) is medication to be sent to? [place] [address]  3. Do they need a 30 day or 90 day supply? 90 day supply

## 2020-01-02 NOTE — Telephone Encounter (Signed)
Pt's medication was sent to pt's pharmacy as requested. Confirmation received.  °

## 2020-01-03 ENCOUNTER — Other Ambulatory Visit: Payer: Self-pay | Admitting: *Deleted

## 2020-01-03 ENCOUNTER — Telehealth: Payer: Self-pay | Admitting: Cardiovascular Disease

## 2020-01-03 MED ORDER — FUROSEMIDE 20 MG PO TABS: 20.0000 mg | ORAL_TABLET | ORAL | 0 refills | Status: DC

## 2020-01-03 NOTE — Telephone Encounter (Signed)
*  STAT* If patient is at the pharmacy, call can be transferred to refill team.   1. Which medications need to be refilled? (please list name of each medication and dose if known)  furosemide (LASIX) 20 MG tablet  2. Which pharmacy/location (including street and city if local pharmacy) is medication to be sent to? [facility] - [place], [place] - [place] [address]  3. Do they need a 30 day or 90 day supply? 90 day    Pharmacy is requesting it be sent ASAP due to wanting to send prescriptions out today.

## 2020-01-04 DIAGNOSIS — I10 Essential (primary) hypertension: Secondary | ICD-10-CM | POA: Diagnosis not present

## 2020-01-23 ENCOUNTER — Other Ambulatory Visit: Payer: Self-pay | Admitting: Internal Medicine

## 2020-01-23 DIAGNOSIS — J441 Chronic obstructive pulmonary disease with (acute) exacerbation: Secondary | ICD-10-CM | POA: Diagnosis not present

## 2020-01-23 DIAGNOSIS — R1310 Dysphagia, unspecified: Secondary | ICD-10-CM

## 2020-01-23 DIAGNOSIS — E049 Nontoxic goiter, unspecified: Secondary | ICD-10-CM | POA: Diagnosis not present

## 2020-01-28 ENCOUNTER — Other Ambulatory Visit: Payer: Medicare HMO

## 2020-01-29 ENCOUNTER — Other Ambulatory Visit: Payer: Self-pay

## 2020-01-29 ENCOUNTER — Ambulatory Visit
Admission: RE | Admit: 2020-01-29 | Discharge: 2020-01-29 | Disposition: A | Discharge status: Home / Self Care | Payer: Medicare HMO | Source: Ambulatory Visit | Attending: Internal Medicine | Admitting: Internal Medicine

## 2020-01-29 DIAGNOSIS — R1310 Dysphagia, unspecified: Secondary | ICD-10-CM

## 2020-01-29 DIAGNOSIS — K219 Gastro-esophageal reflux disease without esophagitis: Secondary | ICD-10-CM | POA: Diagnosis not present

## 2020-01-29 DIAGNOSIS — E049 Nontoxic goiter, unspecified: Secondary | ICD-10-CM | POA: Diagnosis not present

## 2020-01-29 DIAGNOSIS — K225 Diverticulum of esophagus, acquired: Secondary | ICD-10-CM | POA: Diagnosis not present

## 2020-02-01 DIAGNOSIS — I480 Paroxysmal atrial fibrillation: Secondary | ICD-10-CM | POA: Diagnosis not present

## 2020-02-01 DIAGNOSIS — J449 Chronic obstructive pulmonary disease, unspecified: Secondary | ICD-10-CM | POA: Diagnosis not present

## 2020-02-01 DIAGNOSIS — F322 Major depressive disorder, single episode, severe without psychotic features: Secondary | ICD-10-CM | POA: Diagnosis not present

## 2020-02-01 DIAGNOSIS — J441 Chronic obstructive pulmonary disease with (acute) exacerbation: Secondary | ICD-10-CM | POA: Diagnosis not present

## 2020-02-01 DIAGNOSIS — E785 Hyperlipidemia, unspecified: Secondary | ICD-10-CM | POA: Diagnosis not present

## 2020-02-01 DIAGNOSIS — I10 Essential (primary) hypertension: Secondary | ICD-10-CM | POA: Diagnosis not present

## 2020-02-01 DIAGNOSIS — I25810 Atherosclerosis of coronary artery bypass graft(s) without angina pectoris: Secondary | ICD-10-CM | POA: Diagnosis not present

## 2020-02-01 DIAGNOSIS — I509 Heart failure, unspecified: Secondary | ICD-10-CM | POA: Diagnosis not present

## 2020-02-01 DIAGNOSIS — I208 Other forms of angina pectoris: Secondary | ICD-10-CM | POA: Diagnosis not present

## 2020-02-04 DIAGNOSIS — I10 Essential (primary) hypertension: Secondary | ICD-10-CM | POA: Diagnosis not present

## 2020-02-12 DIAGNOSIS — R1310 Dysphagia, unspecified: Secondary | ICD-10-CM | POA: Diagnosis not present

## 2020-02-12 DIAGNOSIS — E049 Nontoxic goiter, unspecified: Secondary | ICD-10-CM | POA: Diagnosis not present

## 2020-02-12 DIAGNOSIS — E042 Nontoxic multinodular goiter: Secondary | ICD-10-CM | POA: Diagnosis not present

## 2020-02-13 ENCOUNTER — Other Ambulatory Visit: Payer: Self-pay | Admitting: *Deleted

## 2020-02-13 DIAGNOSIS — I739 Peripheral vascular disease, unspecified: Secondary | ICD-10-CM

## 2020-02-24 ENCOUNTER — Ambulatory Visit (INDEPENDENT_AMBULATORY_CARE_PROVIDER_SITE_OTHER): Payer: Medicare HMO | Admitting: Physician Assistant

## 2020-02-24 ENCOUNTER — Ambulatory Visit (HOSPITAL_COMMUNITY)
Admission: RE | Admit: 2020-02-24 | Discharge: 2020-02-24 | Disposition: A | Discharge status: Home / Self Care | Payer: Medicare HMO | Source: Ambulatory Visit | Attending: Physician Assistant | Admitting: Physician Assistant

## 2020-02-24 ENCOUNTER — Other Ambulatory Visit: Payer: Self-pay

## 2020-02-24 VITALS — BP 104/69 | HR 64 | Temp 98.7°F | Resp 20 | Ht 61.0 in | Wt 126.8 lb

## 2020-02-24 DIAGNOSIS — I739 Peripheral vascular disease, unspecified: Secondary | ICD-10-CM

## 2020-02-24 NOTE — Progress Notes (Signed)
HISTORY AND PHYSICAL     CC:  follow up. Requesting Provider:  Josetta Huddle, MD  HPI: This is a 69 y.o. female who is here today for follow up for PAD.  She has hx of left to right femorofemoral bypass grafting in [facility] in 1999.  She subsequently had rest pain at night and her walking was limited severely.  She underwent reexploration bilateral common femoral arteries with aortobifemoral bypass grafting with reimplantation of right profunda femoris artery on the aortobifemoral limb on 06/18/2019 by Dr. Donzetta Matters.  She required cardiac catheterization post operatively with need for Impella via right axillary artery.  She developed a left brachial artery psa and underwent repair of this by Dr. Trula Slade on 06/30/2019.  She had triphasic left radial and ulnar doppler signals at the completion of the case.   She was seen by Dr. Donzetta Matters in May 2021 and at that time, she had palpable DP pulses.  She did have a small 1cm wound on the midline incision that she was placing wet to dry dressings.    She has hx of HLD, HTN, CHF, CAD and s/p CABG, PVD, COPD.  The pt returns today for follow up.  She states she has been doing well.  She does have some right leg pain, but feels this is due to her hx of right hip replacement.  She also has chronic back pain.  She states that the wound on her incision has healed.  She denies any pain in her left hand.  She states that she has neuropathy.  She has not followed up with cardiology.   The pt is on a statin for cholesterol management.    The pt is on an aspirin.    Other AC:  Eliquis The pt is on ARB,  for hypertension.  The pt does not have diabetes. Tobacco hx:  former    Past Medical History:  Diagnosis Date  . Anemia   . Anginal pain ([place])    jaw pain 07/2016, s/p 07/15/16 nuclear stress test  . Anxiety   . Arthritis    "lower back" (08/24/2017)  . Asthma   . [place] Obstructive Pulmonary Disease   . Chronic back pain   . Chronic combined systolic and  diastolic CHF    EF 3/78: 58-85 // EF 4/21: 55-60 // EF 4/21: 20-25  . Chronic leg pain   . Chronic lower back pain   . Coronary Artery Disease    a. s/p NSTEMI >> CABG 1998 // s/p PCI 2013 // [place] 01/2015: DES to SVG-RCA.// Cath 05/2019: all grafts occluded; mod non-obs LAD disease w L-L and L-R collats - med Rx // post Ao-Bifem bypass MI c/b PEA, CGS requiring Impella >> anatomy unchanged - Med Rx  . Depression   . Fall 08/2016  . Gastroesophageal reflux disease   . Glucose intolerance   . Headache    "a few/month" (08/24/2017)  . HLD (hyperlipidemia)   . Hx of DVT X 3   RLE  . Hypertension   . Myocardial infarction ([place]) 1999  . OSA on CPAP   . Paroxysmal atrial fibrillation    a. 09/2013 post-op b. 01/2015 // Apixaban // recurrent AFib post MI after Ao-Bifem in 06/2019 >> Amio Rx  . Peripheral artery disease    s/p L fem-fem bypass // s/p Ao-Bifem bypass in 06/2019  . Peripheral neuropathy   . Pneumonia   . Shortness of breath    when walking  . Wears glasses  Past Surgical History:  Procedure Laterality Date  . ABDOMINAL AORTOGRAM W/LOWER EXTREMITY Bilateral 04/29/2019   Procedure: ABDOMINAL AORTOGRAM W/LOWER EXTREMITY;  Surgeon: Waynetta Sandy, MD;  Location: [place] [facility];  Service: Cardiovascular;  Laterality: Bilateral;  . ANTERIOR CERVICAL DECOMP/DISCECTOMY FUSION  1991  . AORTA - BILATERAL FEMORAL ARTERY BYPASS GRAFT N/A 06/18/2019   Procedure: AORTA BIFEMORAL BYPASS GRAFT;  Surgeon: Waynetta Sandy, MD;  Location: [place];  Service: Vascular;  Laterality: N/A;  . ARTERY REPAIR Left 06/30/2019   Procedure: LEFT BRACHIAL ARTERY REPAIR, EVACUATION OF HEMATOMA;  Surgeon: Serafina Mitchell, MD;  Location: [place];  Service: Vascular;  Laterality: Left;  . BACK SURGERY    . CARDIAC CATHETERIZATION N/A 01/22/2015   Procedure: Left Heart Cath and Coronary Angiography;  Surgeon: Troy Sine, MD;  Location: [place] [facility];  Service: Cardiovascular;   Laterality: N/A;  . CARDIAC CATHETERIZATION N/A 01/22/2015   Procedure: Coronary Stent Intervention;  Surgeon: Troy Sine, MD;  Location: [place] [facility];  Service: Cardiovascular;  Laterality: N/A;  3.0x12 xience prox SVG to RCA  . CARDIAC CATHETERIZATION  03/09/2000   hx/notes 03/20/2000  . CHOLECYSTECTOMY N/A 09/16/2013   Procedure: LAPAROSCOPIC CHOLECYSTECTOMY CONVERTED TO OPEN CHOLECYSTECTOMY WITH CHOLANGIOGRAM;  Surgeon: [name], MD;  Location: [place];  Service: General;  Laterality: N/A;  . CORONARY ANGIOGRAPHY N/A 06/27/2019   Procedure: CORONARY ANGIOGRAPHY (CATH LAB);  Surgeon: Sherren Mocha, MD;  Location: [place] [facility];  Service: Cardiovascular;  Laterality: N/A;  . CORONARY ANGIOPLASTY WITH STENT PLACEMENT  11/2011, 02/2012, 01/2015   DES to SVG-RCA both times, In [place], [place], Dr. Bruce Donath  . CORONARY ARTERY BYPASS GRAFT  1998   LIMA-LAD, SVG-OM, SVG-RCA  . ERCP N/A 09/19/2013   Procedure: ENDOSCOPIC RETROGRADE CHOLANGIOPANCREATOGRAPHY (ERCP);  Surgeon: [name], MD;  Location: [place];  Service: Endoscopy;  Laterality: N/A;  . FINGER SURGERY Right    ring finger "fused"  . FRACTURE SURGERY    . IRRIGATION AND DEBRIDEMENT ABSCESS Right 10/17/2013   Procedure: INCISION AND DRAINAGE RIGHT SUBCOSTAL WOUND;  Surgeon: [name], MD;  Location: [facility];  Service: General;  Laterality: Right;  . JOINT REPLACEMENT    . KNEE ARTHROSCOPY Right 1998  . LEFT HEART CATH AND CORS/GRAFTS ANGIOGRAPHY N/A 07/25/2016   Procedure: Left Heart Cath and Cors/Grafts Angiography;  Surgeon: Jettie Booze, MD;  Location: [place] [facility];  Service: Cardiovascular;  Laterality: N/A;  . LEFT HEART CATH AND CORS/GRAFTS ANGIOGRAPHY N/A 06/05/2019   Procedure: LEFT HEART CATH AND CORS/GRAFTS ANGIOGRAPHY;  Surgeon: Burnell Blanks, MD;  Location: [place] [facility];  Service: Cardiovascular;  Laterality: N/A;  . LEFT HEART CATHETERIZATION WITH CORONARY/GRAFT  ANGIOGRAM N/A 07/19/2013   Procedure: LEFT HEART CATHETERIZATION WITH Beatrix Fetters;  Surgeon: Leonie Man, MD;  Location: [facility];  Service: Cardiovascular;  Laterality: N/A;  . MASS EXCISION Right 06/25/2014   Procedure: EXCISION BUTTOCK MASS ;  Surgeon: Coralie Keens, MD;  Location: [place];  Service: General;  Laterality: Right;  . ORIF HUMERUS FRACTURE Left 08/27/2013   Procedure: OPEN REDUCTION INTERNAL FIXATION (ORIF) LEFT HUMERUS ;  Surgeon: Rozanna Box, MD;  Location: [place];  Service: Orthopedics;  Laterality: Left;  . PLACEMENT OF IMPELLA LEFT VENTRICULAR ASSIST DEVICE N/A 06/24/2019   Procedure: PLACEMENT OF IMPELLA LEFT VENTRICULAR ASSIST DEVICE;  Surgeon: Wonda Olds, MD;  Location: [place];  Service: Open Heart Surgery;  Laterality: N/A;  . [place]  . REMOVAL OF IMPELLA LEFT VENTRICULAR ASSIST DEVICE Right 06/28/2019   Procedure: REMOVAL OF IMPELLA LEFT VENTRICULAR ASSIST DEVICE;  Surgeon: Wonda Olds, MD;  Location: [place];  Service: Open Heart Surgery;  Laterality: Right;  . THROMBECTOMY / EMBOLECTOMY FEMORAL ARTERY Right    hx/notes 03/20/2000  . TOTAL HIP ARTHROPLASTY Right 07/19/2016   Procedure: TOTAL HIP ARTHROPLASTY ANTERIOR APPROACH;  Surgeon: [name], MD;  Location: [place];  Service: Orthopedics;  Laterality: Right;  . TOTAL HIP REVISION Right 08/22/2017   Procedure: TOTAL HIP REVISION;  Surgeon: [name], MD;  Location: [place];  Service: Orthopedics;  Laterality: Right;    Allergies  Allergen Reactions  . Benadryl [Diphenhydramine] Shortness Of Breath  . Penicillins Diarrhea    Did it involve swelling of the face/tongue/throat, SOB, or low BP? No Did it involve sudden or severe rash/hives, skin peeling, or any reaction on the inside of your mouth or nose? No Did you need to seek medical attention at a hospital or doctor's office? No When did it last happen?2 months If  all above answers are "NO", may proceed with cephalosporin use.  . Adhesive [Tape] Other (See Comments)    Burning of skin  . Amoxicillin Diarrhea and Other (See Comments)    Has patient had a PCN reaction causing immediate rash, facial/tongue/throat swelling, SOB or lightheadedness with hypotension: No Has patient had a PCN reaction causing severe rash involving mucus membranes or skin necrosis: No Has patient had a PCN reaction that required hospitalization No Has patient had a PCN reaction occurring within the last 10 years: Yes -- noted rxn as diarrhea If all of the above answers are "NO", then may proceed with Cephalosporin use.   . Cyclobenzaprine Other (See Comments)    Causes restless leg syndrome Restless syndrome  . Hydrocodone Itching    Current Outpatient Medications  Medication Sig Dispense Refill  . acetaminophen (TYLENOL) 500 MG tablet Take 500-1,000 mg by mouth every 6 (six) hours as needed (for pain.).    Marland Kitchen albuterol (PROVENTIL HFA;VENTOLIN HFA) 108 (90 Base) MCG/ACT inhaler Inhale 2 puffs into the lungs every 6 (six) hours as needed for wheezing or shortness of breath. 1 each 1  . ALPRAZolam (XANAX) 0.5 MG tablet Take 0.5 mg by mouth 3 (three) times daily as needed for anxiety.    Marland Kitchen amiodarone (PACERONE) 100 MG tablet Take 1 tablet (100 mg total) by mouth daily. 90 tablet 2  . apixaban (ELIQUIS) 5 MG TABS tablet Take 1 tablet (5 mg total) by mouth 2 (two) times daily. 180 tablet 1  . aspirin 81 MG chewable tablet Chew 1 tablet (81 mg total) by mouth daily.    Marland Kitchen atorvastatin (LIPITOR) 80 MG tablet Take 1 tablet (80 mg total) by mouth daily. 90 tablet 1  . buPROPion (WELLBUTRIN XL) 150 MG 24 hr tablet Take 150 mg by mouth daily.    . diclofenac Sodium (VOLTAREN) 1 % GEL Apply 1 g topically 4 (four) times daily as needed for pain.    Marland Kitchen ezetimibe (ZETIA) 10 MG tablet Take 10 mg by mouth daily.    . furosemide (LASIX) 20 MG tablet Take 1 tablet (20 mg total) by mouth every  other day. Please call and schedule an appointment for further refills 1st attempt 45 tablet 0  . gabapentin (NEURONTIN) 300 MG capsule Take 300 mg by mouth 3 (three) times daily as needed for  pain.    . nitroGLYCERIN (NITROSTAT) 0.4 MG SL tablet Place 1 tablet (0.4 mg total) under the tongue every 5 (five) minutes as needed for chest pain. 25 tablet 5  . oxyCODONE 10 MG TABS Take 1 tablet (10 mg total) by mouth 3 (three) times daily. 30 tablet 0  . pantoprazole (PROTONIX) 40 MG tablet TAKE 1 TABLET (40 MG TOTAL) BY MOUTH DAILY. 90 tablet 1  . pramipexole (MIRAPEX) 0.25 MG tablet Take 0.25 mg by mouth at bedtime.     . promethazine (PHENERGAN) 12.5 MG tablet TAKE ONE TABLET BY MOUTH EVERY 6 HOURS AS NEEDED FOR NAUSEA AND VOMITING 30 tablet 3  . ranolazine (RANEXA) 1000 MG SR tablet Take 1,000 mg by mouth 2 (two) times daily.    . sacubitril-valsartan (ENTRESTO) 24-26 MG Take 1 tablet by mouth 2 (two) times daily. 180 tablet 1  . sertraline (ZOLOFT) 100 MG tablet Take 150 mg by mouth at bedtime.      Current Facility-Administered Medications  Medication Dose Route Frequency Provider Last Rate Last Admin  . ferrous sulfate tablet 325 mg  325 mg Oral BID WC Irene Shipper, MD        Family History  Problem Relation Age of Onset  . Lung cancer Mother   . Clotting disorder Father   . Lung cancer Sister   . Heart disease Brother   . Heart disease Brother   . Colon cancer Neg Hx   . Breast cancer Neg Hx     Social History   Socioeconomic History  . Marital status: Divorced    Spouse name: Not on file  . Number of children: 5  . Years of education: Not on file  . Highest education level: Not on file  Occupational History  . Occupation: Disabled  Tobacco Use  . Smoking status: Former Smoker    Packs/day: 0.50    Years: 34.00    Pack years: 17.00    Types: Cigarettes    Quit date: 07/20/1998    Years since quitting: 21.6  . Smokeless tobacco: Never Used  Vaping Use  . Vaping Use:  Never used  Substance and Sexual Activity  . Alcohol use: Not Currently  . Drug use: Yes    Types: Marijuana  . Sexual activity: Not Currently  Other Topics Concern  . Not on file  Social History Narrative   Lives with best friend.    Social Determinants of Health   Financial Resource Strain: Not on file  Food Insecurity: Not on file  Transportation Needs: Not on file  Physical Activity: Not on file  Stress: Not on file  Social Connections: Not on file  Intimate Partner Violence: Not on file     REVIEW OF SYSTEMS:   [X]  denotes positive finding, [ ]  denotes negative finding Cardiac  Comments:  Chest pain or chest pressure:    Shortness of breath upon exertion: x   Short of breath when lying flat:    Irregular heart rhythm: x       Vascular    Pain in calf, thigh, or hip brought on by ambulation: x   Pain in feet at night that wakes you up from your sleep:  x   Blood clot in your veins:    Leg swelling:  x       Pulmonary    Oxygen at home:    Productive cough:     Wheezing:         Neurologic  Sudden weakness in arms or legs:     Sudden numbness in arms or legs:     Sudden onset of difficulty speaking or slurred speech:    Temporary loss of vision in one eye:     Problems with dizziness:         Gastrointestinal    Blood in stool:     Vomited blood:         Genitourinary    Burning when urinating:     Blood in urine:        Psychiatric    Major depression:         Hematologic    Bleeding problems:    Problems with blood clotting too easily:        Skin    Rashes or ulcers:        Constitutional    Fever or chills:      PHYSICAL EXAMINATION:  Today's Vitals   02/24/20 1336  BP: 104/69  Pulse: 64  Resp: 20  Temp: 98.7 F (37.1 C)  TempSrc: Temporal  SpO2: 100%  Weight: 126 lb 12.8 oz (57.5 kg)  Height: 5\' 1"  (1.549 m)   Body mass index is 23.96 kg/m.   General:  WDWN in NAD; vital signs documented above Gait: Not  observed HENT: WNL, normocephalic Pulmonary: normal non-labored breathing , without wheezing Cardiac: regular HR, without  Murmur; without carotid bruits Abdomen: soft, NT, no masses; aortic pulse is not palpable Skin: without rashes Vascular Exam/Pulses:  Right Left  Radial Unable to palpate Unable to palpate  Ulnar 2+ (normal) 2+ (normal)  Femoral 2+ (normal) 2+ (normal)  Popliteal Unable to palpate Unable to palpate  DP 2+ (normal) 2+ (normal)  PT Unable to palpate Unable to palpate   Extremities: without ischemic changes, without Gangrene , without cellulitis; without open wounds; bilateral groin incisions have healed nicely. Musculoskeletal: no muscle wasting or atrophy  Neurologic: A&O X 3;  No focal weakness or paresthesias are detected Psychiatric:  The pt has Normal affect.   Non-Invasive Vascular Imaging:   ABI's/TBI's on 02/24/2020: Right:  0.92/0.98 - Great toe pressure: 125 Left:  1.07/0.90 - Great toe pressure: 115    ASSESSMENT/PLAN:: 70 y.o. female here for follow up for PAD with hx of aortobifemoral bypass grafting and repair or left brachial artery psa in April 2021  Aorto-iliac occlusive disease -pt doing well with palpable DP pulses bilaterally -she is s/p aortobifemoral bypass grafting-her wound on the midline incision has healed nicely as well as bilateral groin incisions.  -pt did have a left brachial artery psa repair and today she has palpable left ulnar pulse -given her PAD/CAD history, I will get a carotid duplex in a year when she returns as I cannot find any results in the computer as to if this has been done.  She has not had any symptoms of stroke.  She knows to present to ER if she does experience stroke sx.  -pt will f/u in one year with ABI and carotid duplex. -she has not followed up with cardiology-our office will make her a follow up appt.   -she will continue her Eliquis and asa and statin    Leontine Locket, [facility] [phone]  Clinic MD:   Trula Slade

## 2020-02-25 DIAGNOSIS — R1310 Dysphagia, unspecified: Secondary | ICD-10-CM | POA: Diagnosis not present

## 2020-02-25 DIAGNOSIS — N179 Acute kidney failure, unspecified: Secondary | ICD-10-CM | POA: Diagnosis not present

## 2020-02-25 DIAGNOSIS — E049 Nontoxic goiter, unspecified: Secondary | ICD-10-CM | POA: Diagnosis not present

## 2020-02-25 DIAGNOSIS — I509 Heart failure, unspecified: Secondary | ICD-10-CM | POA: Diagnosis not present

## 2020-02-25 DIAGNOSIS — J449 Chronic obstructive pulmonary disease, unspecified: Secondary | ICD-10-CM | POA: Diagnosis not present

## 2020-02-25 DIAGNOSIS — R1030 Lower abdominal pain, unspecified: Secondary | ICD-10-CM | POA: Diagnosis not present

## 2020-02-25 DIAGNOSIS — Z23 Encounter for immunization: Secondary | ICD-10-CM | POA: Diagnosis not present

## 2020-02-25 DIAGNOSIS — M549 Dorsalgia, unspecified: Secondary | ICD-10-CM | POA: Diagnosis not present

## 2020-03-03 ENCOUNTER — Telehealth: Payer: Self-pay | Admitting: Cardiovascular Disease

## 2020-03-03 DIAGNOSIS — I10 Essential (primary) hypertension: Secondary | ICD-10-CM | POA: Diagnosis not present

## 2020-03-03 DIAGNOSIS — J449 Chronic obstructive pulmonary disease, unspecified: Secondary | ICD-10-CM | POA: Diagnosis not present

## 2020-03-03 DIAGNOSIS — E785 Hyperlipidemia, unspecified: Secondary | ICD-10-CM | POA: Diagnosis not present

## 2020-03-03 DIAGNOSIS — I208 Other forms of angina pectoris: Secondary | ICD-10-CM | POA: Diagnosis not present

## 2020-03-03 DIAGNOSIS — F322 Major depressive disorder, single episode, severe without psychotic features: Secondary | ICD-10-CM | POA: Diagnosis not present

## 2020-03-03 DIAGNOSIS — I509 Heart failure, unspecified: Secondary | ICD-10-CM | POA: Diagnosis not present

## 2020-03-03 DIAGNOSIS — I25810 Atherosclerosis of coronary artery bypass graft(s) without angina pectoris: Secondary | ICD-10-CM | POA: Diagnosis not present

## 2020-03-03 DIAGNOSIS — J441 Chronic obstructive pulmonary disease with (acute) exacerbation: Secondary | ICD-10-CM | POA: Diagnosis not present

## 2020-03-03 DIAGNOSIS — I480 Paroxysmal atrial fibrillation: Secondary | ICD-10-CM | POA: Diagnosis not present

## 2020-03-03 NOTE — Telephone Encounter (Signed)
Called Katie with Dr. Kevan Ny office. Left message to fax records to our office and any lab results as well. Left message to call back if any questions.

## 2020-03-03 NOTE — Telephone Encounter (Signed)
New Message:   Pt c/o BP issue: STAT if pt c/o blurred vision, one-sided weakness or slurred speech  1. What are your last 5 BP readings? 120/68, 98/57 and 94/47  2. Are you having any other symptoms (ex. Dizziness, headache, blurred vision, passed out)? no  3. What is your BP issue?   Katie from Dr Kevan Ny 's office called. She said Dr Barnett Hatter wanted Dr Elease Hashimoto to be aware of pt's blood pressure readings.  02-28-20   120/68  03-01-20   98/57  03-02-20   94/47  The concern is pt have fallen twice before these blood pressure readings. Pt landed on her bottom, she denied any dizziness or lightheadedness and chest pain. Pt did not go to the ER for either fall. Florentina Addison says pt have an appointment with Dr Kevan Ny today. She said they will keep Dr Elease Hashimoto in the Loop.,

## 2020-03-03 NOTE — Telephone Encounter (Signed)
Please get record from that visit with Dr Kevan Ny for review once pt seen (not emergency, may be a couple days to be released)  Also. Labs if any ordered

## 2020-03-06 DIAGNOSIS — I10 Essential (primary) hypertension: Secondary | ICD-10-CM | POA: Diagnosis not present

## 2020-03-10 DIAGNOSIS — R609 Edema, unspecified: Secondary | ICD-10-CM | POA: Diagnosis not present

## 2020-03-10 DIAGNOSIS — I480 Paroxysmal atrial fibrillation: Secondary | ICD-10-CM | POA: Diagnosis not present

## 2020-03-10 DIAGNOSIS — I10 Essential (primary) hypertension: Secondary | ICD-10-CM | POA: Diagnosis not present

## 2020-03-10 DIAGNOSIS — I25810 Atherosclerosis of coronary artery bypass graft(s) without angina pectoris: Secondary | ICD-10-CM | POA: Diagnosis not present

## 2020-03-10 DIAGNOSIS — I509 Heart failure, unspecified: Secondary | ICD-10-CM | POA: Diagnosis not present

## 2020-03-10 DIAGNOSIS — J449 Chronic obstructive pulmonary disease, unspecified: Secondary | ICD-10-CM | POA: Diagnosis not present

## 2020-03-10 DIAGNOSIS — F322 Major depressive disorder, single episode, severe without psychotic features: Secondary | ICD-10-CM | POA: Diagnosis not present

## 2020-03-10 DIAGNOSIS — L309 Dermatitis, unspecified: Secondary | ICD-10-CM | POA: Diagnosis not present

## 2020-03-10 DIAGNOSIS — M549 Dorsalgia, unspecified: Secondary | ICD-10-CM | POA: Diagnosis not present

## 2020-03-26 DIAGNOSIS — I208 Other forms of angina pectoris: Secondary | ICD-10-CM | POA: Diagnosis not present

## 2020-03-26 DIAGNOSIS — E785 Hyperlipidemia, unspecified: Secondary | ICD-10-CM | POA: Diagnosis not present

## 2020-03-26 DIAGNOSIS — I480 Paroxysmal atrial fibrillation: Secondary | ICD-10-CM | POA: Diagnosis not present

## 2020-03-26 DIAGNOSIS — I25810 Atherosclerosis of coronary artery bypass graft(s) without angina pectoris: Secondary | ICD-10-CM | POA: Diagnosis not present

## 2020-03-26 DIAGNOSIS — J441 Chronic obstructive pulmonary disease with (acute) exacerbation: Secondary | ICD-10-CM | POA: Diagnosis not present

## 2020-03-26 DIAGNOSIS — I10 Essential (primary) hypertension: Secondary | ICD-10-CM | POA: Diagnosis not present

## 2020-03-26 DIAGNOSIS — I509 Heart failure, unspecified: Secondary | ICD-10-CM | POA: Diagnosis not present

## 2020-03-26 DIAGNOSIS — F322 Major depressive disorder, single episode, severe without psychotic features: Secondary | ICD-10-CM | POA: Diagnosis not present

## 2020-03-26 DIAGNOSIS — J449 Chronic obstructive pulmonary disease, unspecified: Secondary | ICD-10-CM | POA: Diagnosis not present

## 2020-03-28 ENCOUNTER — Other Ambulatory Visit: Payer: Self-pay | Admitting: Cardiovascular Disease

## 2020-04-02 ENCOUNTER — Other Ambulatory Visit: Payer: Self-pay | Admitting: Internal Medicine

## 2020-04-02 DIAGNOSIS — E049 Nontoxic goiter, unspecified: Secondary | ICD-10-CM

## 2020-04-03 ENCOUNTER — Inpatient Hospital Stay: Admission: RE | Admit: 2020-04-03 | Payer: Medicare HMO | Source: Ambulatory Visit

## 2020-04-06 ENCOUNTER — Encounter: Payer: Medicare HMO | Admitting: Cardiovascular Disease

## 2020-04-06 ENCOUNTER — Encounter: Payer: Self-pay | Admitting: Cardiovascular Disease

## 2020-04-06 DIAGNOSIS — R609 Edema, unspecified: Secondary | ICD-10-CM | POA: Diagnosis not present

## 2020-04-06 DIAGNOSIS — I10 Essential (primary) hypertension: Secondary | ICD-10-CM | POA: Diagnosis not present

## 2020-04-06 NOTE — Progress Notes (Signed)
No show  This encounter was created in error - please disregard.

## 2020-04-09 ENCOUNTER — Observation Stay (HOSPITAL_COMMUNITY)
Admission: EM | Admit: 2020-04-09 | Discharge: 2020-04-11 | Disposition: A | Discharge status: Home / Self Care | Payer: Medicare HMO | Attending: Internal Medicine | Admitting: Internal Medicine

## 2020-04-09 DIAGNOSIS — I2510 Atherosclerotic heart disease of native coronary artery without angina pectoris: Secondary | ICD-10-CM | POA: Diagnosis not present

## 2020-04-09 DIAGNOSIS — I5042 Chronic combined systolic (congestive) and diastolic (congestive) heart failure: Secondary | ICD-10-CM | POA: Diagnosis not present

## 2020-04-09 DIAGNOSIS — Z7982 Long term (current) use of aspirin: Secondary | ICD-10-CM | POA: Insufficient documentation

## 2020-04-09 DIAGNOSIS — I959 Hypotension, unspecified: Secondary | ICD-10-CM | POA: Diagnosis not present

## 2020-04-09 DIAGNOSIS — T40601A Poisoning by unspecified narcotics, accidental (unintentional), initial encounter: Secondary | ICD-10-CM

## 2020-04-09 DIAGNOSIS — T50901A Poisoning by unspecified drugs, medicaments and biological substances, accidental (unintentional), initial encounter: Secondary | ICD-10-CM | POA: Diagnosis not present

## 2020-04-09 DIAGNOSIS — I110 Hypertensive heart disease with heart failure: Secondary | ICD-10-CM | POA: Diagnosis not present

## 2020-04-09 DIAGNOSIS — R404 Transient alteration of awareness: Secondary | ICD-10-CM | POA: Diagnosis not present

## 2020-04-09 DIAGNOSIS — Z79899 Other long term (current) drug therapy: Secondary | ICD-10-CM | POA: Insufficient documentation

## 2020-04-09 DIAGNOSIS — Z20822 Contact with and (suspected) exposure to covid-19: Secondary | ICD-10-CM | POA: Diagnosis not present

## 2020-04-09 DIAGNOSIS — R4020 Unspecified coma: Secondary | ICD-10-CM | POA: Diagnosis not present

## 2020-04-09 DIAGNOSIS — I739 Peripheral vascular disease, unspecified: Secondary | ICD-10-CM | POA: Diagnosis not present

## 2020-04-09 DIAGNOSIS — T402X1A Poisoning by other opioids, accidental (unintentional), initial encounter: Secondary | ICD-10-CM | POA: Diagnosis not present

## 2020-04-09 DIAGNOSIS — J45909 Unspecified asthma, uncomplicated: Secondary | ICD-10-CM | POA: Diagnosis not present

## 2020-04-09 DIAGNOSIS — I255 Ischemic cardiomyopathy: Secondary | ICD-10-CM | POA: Insufficient documentation

## 2020-04-09 DIAGNOSIS — F32A Depression, unspecified: Secondary | ICD-10-CM | POA: Diagnosis not present

## 2020-04-09 DIAGNOSIS — R4182 Altered mental status, unspecified: Secondary | ICD-10-CM | POA: Diagnosis not present

## 2020-04-09 DIAGNOSIS — I10 Essential (primary) hypertension: Secondary | ICD-10-CM | POA: Diagnosis not present

## 2020-04-09 DIAGNOSIS — I252 Old myocardial infarction: Secondary | ICD-10-CM | POA: Insufficient documentation

## 2020-04-09 DIAGNOSIS — R400 Somnolence: Secondary | ICD-10-CM | POA: Diagnosis not present

## 2020-04-09 DIAGNOSIS — Z87891 Personal history of nicotine dependence: Secondary | ICD-10-CM | POA: Diagnosis not present

## 2020-04-09 DIAGNOSIS — I480 Paroxysmal atrial fibrillation: Secondary | ICD-10-CM | POA: Diagnosis not present

## 2020-04-09 DIAGNOSIS — J449 Chronic obstructive pulmonary disease, unspecified: Secondary | ICD-10-CM | POA: Insufficient documentation

## 2020-04-09 DIAGNOSIS — R0902 Hypoxemia: Secondary | ICD-10-CM | POA: Diagnosis not present

## 2020-04-09 MED ORDER — SODIUM CHLORIDE 0.9 % IV BOLUS
500.0000 mL | Freq: Once | INTRAVENOUS | Status: AC
  Administered 2020-04-10: 500 mL via INTRAVENOUS

## 2020-04-09 MED ORDER — NALOXONE HCL 0.4 MG/ML IJ SOLN
0.4000 mg | Freq: Once | INTRAMUSCULAR | Status: AC
  Administered 2020-04-09: 0.4 mg via INTRAVENOUS

## 2020-04-09 MED ORDER — NALOXONE HCL 2 MG/2ML IJ SOSY
PREFILLED_SYRINGE | INTRAMUSCULAR | Status: AC
  Filled 2020-04-09: qty 2

## 2020-04-09 NOTE — ED Triage Notes (Signed)
Pt arrived via [facility]   Pt found unconscious on couch by family, at time of ems arrival BP 70/40, bradycardic, shallow respirations. GCS 12. Pt has prescriptions for pain medication, but no history of abuse noted by EMS. Pt received 500cc of NS enroute.   NPA in place at time of arrival, protecting airway, 100RA.

## 2020-04-09 NOTE — ED Provider Notes (Signed)
[facility] EMERGENCY DEPARTMENT Provider Note   CSN: [id number] Arrival date & time: 04/09/20  2334     History Chief Complaint  Patient presents with  . Unresponsive    LEVEL 5 CAVEAT 2/2 AMS  Wendy [name] is a 70 y.o. female.   70 y/o female with hx of HLD, HTN, CHF (LVEF 45-50%), CAD and s/p CABG (1998) and PCI (2013, 2016), PVD, COPD, and PAF (on Eliquis) presents to the ED for AMS. Found unconscious on the couch by family, per EMS. Was hypotensive prior to transport with systolic BP of 70. Given 400cc IVF en route. EMS reporting bradycardia, shallow respirations, but no hypoxia. Protecting airway with NPA in place. Suspected overuse of chronic prescribed opiates. Patient will awake to loud voice and painful stimuli.        Past Medical History:  Diagnosis Date  . Anemia   . Anginal pain (HCC)    jaw pain 07/2016, s/p 07/15/16 nuclear stress test  . Anxiety   . Arthritis    "lower back" (08/24/2017)  . Asthma   . [place] Obstructive Pulmonary Disease   . Chronic back pain   . Chronic combined systolic and diastolic CHF    EF 5/18: 60-65 // EF 4/21: 55-60 // EF 4/21: 20-25  . Chronic leg pain   . Chronic lower back pain   . Coronary Artery Disease    a. s/p NSTEMI >> CABG 1998 // s/p PCI 2013 // LHC 01/2015: DES to SVG-RCA.// Cath 05/2019: all grafts occluded; mod non-obs LAD disease w L-L and L-R collats - med Rx // post Ao-Bifem bypass MI c/b PEA, CGS requiring Impella >> anatomy unchanged - Med Rx  . Depression   . Fall 08/2016  . Gastroesophageal reflux disease   . Glucose intolerance   . Headache    "a few/month" (08/24/2017)  . HLD (hyperlipidemia)   . Hx of DVT X 3   RLE  . Hypertension   . Myocardial infarction (HCC) 1999  . OSA on CPAP   . Paroxysmal atrial fibrillation    a. 09/2013 post-op b. 01/2015 // Apixaban // recurrent AFib post MI after Ao-Bifem in 06/2019 >> Amio Rx  . Peripheral artery disease    s/p L fem-fem bypass //  s/p Ao-Bifem bypass in 06/2019  . Peripheral neuropathy   . Pneumonia   . Shortness of breath    when walking  . Wears glasses     Patient Active Problem List   Diagnosis Date Noted  . AKI (acute kidney injury) (HCC) 09/20/2019  . Syncope 09/19/2019  . Prolonged QT interval 09/10/2019  . Acute respiratory failure with hypoxia (HCC) 09/10/2019  . Hypokalemia 09/10/2019  . Hypomagnesemia 09/10/2019  . Hematoma 07/21/2019  . Cardiogenic shock (HCC)   . Aortoiliac occlusive disease (HCC) 06/18/2019  . Coronary artery disease of native artery of native heart with stable angina pectoris (HCC)   . 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 Acute metabolic encephalopathy 08/15/2016  . Acute on chronic combined systolic and diastolic CHF (congestive heart failure) (HCC) 07/26/2016  . Troponin level elevated 07/26/2016  . Chronic anticoagulation 07/22/2016  . CAD S/P multiple PCIs-last one 2016 07/22/2016  . NSTEMI (non-ST elevated myocardial infarction) (HCC) 07/22/2016  . Primary osteoarthritis of right hip 06/27/2016  . Avascular necrosis of hip, right (HCC) 06/27/2016  . PVD (peripheral vascular disease) (HCC)   . Essential (primary) hypertension 01/14/2015  . Coronary artery disease involving native  coronary artery of native heart without angina pectoris 10/06/2014  . Acute angina (HCC) 05/12/2014  . Epigastric pain   . Chest pain with high risk of acute coronary syndrome 10/21/2013  . Acute respiratory failure (HCC) 09/20/2013  . Nonspecific abnormal results of liver function study 09/17/2013  . Cholecystitis, acute 09/15/2013  . PAD (peripheral artery disease) (HCC)   . COPD (chronic obstructive pulmonary disease) (HCC)   . Peripheral neuropathy   . Chronic back pain   . Sleep apnea   . Anxiety   . GERD (gastroesophageal reflux disease)   . 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 Hypertension 07/20/2013  . HLD (hyperlipidemia) 07/20/2013  . Unstable angina (HCC)  07/19/2013  . Hx of CABG 07/19/2013    Past Surgical History:  Procedure Laterality Date  . ABDOMINAL AORTOGRAM W/LOWER EXTREMITY Bilateral 04/29/2019   Procedure: ABDOMINAL AORTOGRAM W/LOWER EXTREMITY;  Surgeon: Maeola Harman, MD;  Location: [facility] INVASIVE CV LAB;  Service: Cardiovascular;  Laterality: Bilateral;  . ANTERIOR CERVICAL DECOMP/DISCECTOMY FUSION  1991  . AORTA - BILATERAL FEMORAL ARTERY BYPASS GRAFT N/A 06/18/2019   Procedure: AORTA BIFEMORAL BYPASS GRAFT;  Surgeon: Maeola Harman, MD;  Location[facility] OR;  Service: Vascular;  Laterality: N/A;  . ARTERY REPAIR Left 06/30/2019   Procedure: LEFT BRACHIAL ARTERY REPAIR, EVACUATION OF HEMATOMA;  Surgeon: Nada Libman, MD;  Location: [facility] OR;  Service: Vascular;  Laterality: Left;  . BACK SURGERY    . CARDIAC CATHETERIZATION N/A 01/22/2015   Procedure: Left Heart Cath and Coronary Angiography;  Surgeon: Lennette Bihari, MD;  Location: [facility] INVASIVE CV LAB;  Service: Cardiovascular;  Laterality: N/A;  . CARDIAC CATHETERIZATION N/A 01/22/2015   Procedure: Coronary Stent Intervention;  Surgeon: Lennette Bihari, MD;  Location: [facility] INVASIVE CV LAB;  Service: Cardiovascular;  Laterality: N/A;  3.0x12 xience prox SVG to RCA  . CARDIAC CATHETERIZATION  03/09/2000   hx/notes 03/20/2000  . CHOLECYSTECTOMY N/A 09/16/2013   Procedure: LAPAROSCOPIC CHOLECYSTECTOMY CONVERTED TO OPEN CHOLECYSTECTOMY WITH CHOLANGIOGRAM;  Surgeon: Shelly Rubenstein, MD;  Location: [facility] OR;  Service: General;  Laterality: N/A;  . CORONARY ANGIOGRAPHY N/A 06/27/2019   Procedure: CORONARY ANGIOGRAPHY (CATH LAB);  Surgeon: Tonny Bollman, MD;  Location: [facility] INVASIVE CV LAB;  Service: Cardiovascular;  Laterality: N/A;  . CORONARY ANGIOPLASTY WITH STENT PLACEMENT  11/2011, 02/2012, 01/2015   DES to SVG-RCA both times, In [place], [place], Dr. Unice Cobble  . CORONARY ARTERY BYPASS GRAFT  1998   LIMA-LAD, SVG-OM, SVG-RCA  . ERCP N/A 09/19/2013   Procedure: ENDOSCOPIC  RETROGRADE CHOLANGIOPANCREATOGRAPHY (ERCP);  Surgeon: Louis Meckel, MD;  Location: [facility] ENDOSCOPY;  Service: Endoscopy;  Laterality: N/A;  . FINGER SURGERY Right    ring finger "fused"  . FRACTURE SURGERY    . IRRIGATION AND DEBRIDEMENT ABSCESS Right 10/17/2013   Procedure: INCISION AND DRAINAGE RIGHT SUBCOSTAL WOUND;  Surgeon: Kandis Cocking, MD;  Location: [facility] ORS;  Service: General;  Laterality: Right;  . JOINT REPLACEMENT    . KNEE ARTHROSCOPY Right 1998  . LEFT HEART CATH AND CORS/GRAFTS ANGIOGRAPHY N/A 07/25/2016   Procedure: Left Heart Cath and Cors/Grafts Angiography;  Surgeon: Corky Crafts, MD;  Location: [facility] INVASIVE CV LAB;  Service: Cardiovascular;  Laterality: N/A;  . LEFT HEART CATH AND CORS/GRAFTS ANGIOGRAPHY N/A 06/05/2019   Procedure: LEFT HEART CATH AND CORS/GRAFTS ANGIOGRAPHY;  Surgeon: Kathleene Hazel, MD;  Location: [facility] INVASIVE CV LAB;  Service: Cardiovascular;  Laterality: N/A;  . LEFT HEART CATHETERIZATION WITH CORONARY/GRAFT ANGIOGRAM N/A  07/19/2013   Procedure: LEFT HEART CATHETERIZATION WITH Isabel Caprice;  Surgeon: Marykay Lex, MD;  Location: [facility] CATH LAB;  Service: Cardiovascular;  Laterality: N/A;  . MASS EXCISION Right 06/25/2014   Procedure: EXCISION BUTTOCK MASS ;  Surgeon: Abigail Miyamoto, MD;  Location: [facility];  Service: General;  Laterality: Right;  . ORIF HUMERUS FRACTURE Left 08/27/2013   Procedure: OPEN REDUCTION INTERNAL FIXATION (ORIF) LEFT HUMERUS ;  Surgeon: Budd Palmer, MD;  Location: [facility] OR;  Service: Orthopedics;  Laterality: Left;  . PLACEMENT OF IMPELLA LEFT VENTRICULAR ASSIST DEVICE N/A 06/24/2019   Procedure: PLACEMENT OF IMPELLA LEFT VENTRICULAR ASSIST DEVICE;  Surgeon: Linden Dolin, MD;  Location: [facility] OR;  Service: Open Heart Surgery;  Laterality: N/A;  . POSTERIOR FUSION CERVICAL SPINE  1992  . REMOVAL OF IMPELLA LEFT VENTRICULAR ASSIST DEVICE Right 06/28/2019   Procedure: REMOVAL OF IMPELLA LEFT  VENTRICULAR ASSIST DEVICE;  Surgeon: Linden Dolin, MD;  Location: [facility] OR;  Service: Open Heart Surgery;  Laterality: Right;  . THROMBECTOMY / EMBOLECTOMY FEMORAL ARTERY Right    hx/notes 03/20/2000  . TOTAL HIP ARTHROPLASTY Right 07/19/2016   Procedure: TOTAL HIP ARTHROPLASTY ANTERIOR APPROACH;  Surgeon: Sheral Apley, MD;  Location: [facility] OR;  Service: Orthopedics;  Laterality: Right;  . TOTAL HIP REVISION Right 08/22/2017   Procedure: TOTAL HIP REVISION;  Surgeon: Sheral Apley, MD;  Location: [facility] OR;  Service: Orthopedics;  Laterality: Right;     OB History   No obstetric history on file.     Family History  Problem Relation Age of Onset  . Lung cancer Mother   . Clotting disorder Father   . Lung cancer Sister   . Heart disease Brother   . Heart disease Brother   . Colon cancer Neg Hx   . Breast cancer Neg Hx     Social History   Tobacco Use  . Smoking status: Former Smoker    Packs/day: 0.50    Years: 34.00    Pack years: 17.00    Types: Cigarettes    Quit date: 07/20/1998    Years since quitting: 21.7  . Smokeless tobacco: Never Used  Vaping Use  . Vaping Use: Never used  Substance Use Topics  . Alcohol use: Not Currently  . Drug use: Yes    Types: Marijuana    Home Medications Prior to Admission medications   Medication Sig Start Date End Date Taking? Authorizing Provider  acetaminophen (TYLENOL) 500 MG tablet Take 500-1,000 mg by mouth every 6 (six) hours as needed (for pain.).    [provider]  albuterol (PROVENTIL HFA;VENTOLIN HFA) 108 (90 Base) MCG/ACT inhaler Inhale 2 puffs into the lungs every 6 (six) hours as needed for wheezing or shortness of breath. 01/01/18   Bhagat, Sharrell Ku, PA  ALPRAZolam (XANAX) 0.5 MG tablet Take 0.5 mg by mouth 3 (three) times daily as needed for anxiety.    [provider]  amiodarone (PACERONE) 100 MG tablet Take 1 tablet (100 mg total) by mouth daily. 08/15/19   Nahser, Deloris Ping, MD  apixaban  (ELIQUIS) 5 MG TABS tablet Take 1 tablet (5 mg total) by mouth 2 (two) times daily. 11/20/18   Nahser, Deloris Ping, MD  aspirin 81 MG chewable tablet Chew 1 tablet (81 mg total) by mouth daily. 07/10/19   Emilie Rutter, PA-C  atorvastatin (LIPITOR) 80 MG tablet Take 1 tablet (80 mg total) by mouth daily. 01/02/20   Nahser, Deloris Ping, MD  buPROPion (WELLBUTRIN XL) 150 MG 24 hr tablet Take 150 mg by mouth daily.    [provider]  diclofenac Sodium (VOLTAREN) 1 % GEL Apply 1 g topically 4 (four) times daily as needed for pain. 02/15/19   [provider]  docusate sodium (STOOL SOFTENER) 100 MG capsule 1 capsule as needed    [provider]  ezetimibe (ZETIA) 10 MG tablet Take 10 mg by mouth daily.    [provider]  furosemide (LASIX) 20 MG tablet Take 1 tablet (20 mg total) by mouth every other day. Please call and schedule an appointment for further refills 1st attempt 01/03/20   Nahser, Deloris Ping, MD  gabapentin (NEURONTIN) 300 MG capsule Take 300 mg by mouth 3 (three) times daily as needed for pain. 02/15/19   [provider]  melatonin 5 MG TABS 1 tablet in the evening    [provider]  metoprolol tartrate (LOPRESSOR) 50 MG tablet Take by mouth. 10/18/19   [provider]  nitroGLYCERIN (NITROSTAT) 0.4 MG SL tablet Place 1 tablet (0.4 mg total) under the tongue every 5 (five) minutes as needed for chest pain. 11/20/18   Nahser, Deloris Ping, MD  oxyCODONE 10 MG TABS Take 1 tablet (10 mg total) by mouth 3 (three) times daily. 07/12/19   Lars Mage, PA-C  pantoprazole (PROTONIX) 40 MG tablet TAKE ONE TABLET BY MOUTH EVERY MORNING 03/30/20   Nahser, Deloris Ping, MD  pramipexole (MIRAPEX) 0.25 MG tablet Take 0.25 mg by mouth at bedtime.  02/18/19   [provider]  promethazine (PHENERGAN) 12.5 MG tablet TAKE ONE TABLET BY MOUTH EVERY 6 HOURS AS NEEDED FOR NAUSEA AND VOMITING 11/06/19   Maeola Harman, MD  ranolazine (RANEXA)  1000 MG SR tablet Take 1,000 mg by mouth 2 (two) times daily.    [provider]  sacubitril-valsartan (ENTRESTO) 24-26 MG Take 1 tablet by mouth 2 (two) times daily. 12/06/19   Nahser, Deloris Ping, MD  sertraline (ZOLOFT) 100 MG tablet Take 150 mg by mouth at bedtime.     [provider]    Allergies    Benadryl [diphenhydramine], Penicillins, Adhesive [tape], Amoxicillin, Hydrocodone-acetaminophen, Cyclobenzaprine, and Hydrocodone  Review of Systems   Review of Systems  Unable to perform ROS: Mental status change    Physical Exam Updated Vital Signs BP (!) 76/39   Pulse 66   Temp 98.2 F (36.8 C) (Oral)   Resp 13   SpO2 96%   Physical Exam Vitals and nursing note reviewed.  Constitutional:      General: She is not in acute distress.    Appearance: She is well-developed and well-nourished. She is not diaphoretic.     Comments: Alert to loud voice and painful stimuli.  HENT:     Head: Normocephalic and atraumatic.     Right Ear: External ear normal.     Left Ear: External ear normal.  Eyes:     General: No scleral icterus.    Extraocular Movements: Extraocular movements intact and EOM normal.     Conjunctiva/sclera: Conjunctivae normal.     Pupils: Pupils are equal, round, and reactive to light.     Comments: 2mm reactive b/l  Cardiovascular:     Rate and Rhythm: Regular rhythm. Bradycardia present.  Pulmonary:     Effort: Pulmonary effort is normal. No respiratory distress.     Breath sounds: No stridor. No wheezing or rales.     Comments: Mild rhonchi b/l bases. Respirations even  and unlabored. sats 100% on RA. Abdominal:     General: There is no distension.     Palpations: Abdomen is soft. There is no mass.     Tenderness: There is no abdominal tenderness. There is no guarding.     Comments: Soft, nondistended, nontender.  Musculoskeletal:        General: Normal range of motion.     Cervical back: Normal range of motion.  Skin:    General: Skin is  warm and dry.     Coloration: Skin is not pale.     Findings: No erythema or rash.  Neurological:     Coordination: Coordination normal.     Comments: Open eyes to voice and painful stimuli. Can tell me she is in the hospital. Speech is slowed, but not slurred. Will fall back to sleep when not stimulated. Moving all extremities spontaneously.  Psychiatric:        Mood and Affect: Mood and affect normal.        Behavior: Behavior normal.     ED Results / Procedures / Treatments   Labs (all labs ordered are listed, but only abnormal results are displayed) Labs Reviewed  CBC WITH DIFFERENTIAL/PLATELET - Abnormal; Notable for the following components:      Result Value   RBC 3.62 (*)    Hemoglobin 10.2 (*)    HCT 34.6 (*)    MCHC 29.5 (*)    Platelets 137 (*)    All other components within normal limits  COMPREHENSIVE METABOLIC PANEL - Abnormal; Notable for the following components:   Calcium 8.6 (*)    Total Protein 6.2 (*)    Albumin 3.0 (*)    All other components within normal limits  I-STAT VENOUS BLOOD GAS, ED - Abnormal; Notable for the following components:   pH, Ven 7.243 (*)    pCO2, Ven 63.8 (*)    pO2, Ven 158.0 (*)    HCT 32.0 (*)    Hemoglobin 10.9 (*)    All other components within normal limits  URINE CULTURE  SARS CORONAVIRUS 2 (TAT 6-24 HRS)  URINALYSIS, ROUTINE W REFLEX MICROSCOPIC  ETHANOL  RAPID URINE DRUG SCREEN, HOSP PERFORMED  CBG MONITORING, ED    EKG None  Radiology DG Chest Port 1 View  Result Date: 04/10/2020 CLINICAL DATA:  Altered mental status EXAM: PORTABLE CHEST 1 VIEW COMPARISON:  09/20/2019 FINDINGS: Cardiac shadow is stable. Postsurgical changes are again seen and stable. Coronary stenting is noted. Lungs are well aerated without focal infiltrate or sizable effusion. No bony abnormality is noted. IMPRESSION: No acute abnormality seen. Electronically Signed   By: Alcide Clever M.D.   On: 04/10/2020 00:18    Procedures .Critical  Care Performed by: Antony Madura, PA-C Authorized by: Antony Madura, PA-C   Critical care provider statement:    Critical care time (minutes):  45   Critical care was necessary to treat or prevent imminent or life-threatening deterioration of the following conditions: opiate overdose; narcan gtt.   Critical care was time spent personally by me on the following activities:  Discussions with consultants, evaluation of patient's response to treatment, examination of patient, ordering and performing treatments and interventions, ordering and review of laboratory studies, ordering and review of radiographic studies, pulse oximetry, re-evaluation of patient's condition, obtaining history from patient or surrogate and review of old charts     Medications Ordered in ED Medications  sodium chloride 0.9 % bolus 1,000 mL (0 mLs Intravenous Stopped 04/10/20 0231)  Followed by  0.9 %  sodium chloride infusion (0 mLs Intravenous Stopped 04/10/20 0256)  naloxone HCl (NARCAN) 4 mg in dextrose 5 % 250 mL infusion (0.25 mg/hr Intravenous New Bag/Given 04/10/20 0316)  sodium chloride 0.9 % bolus 500 mL (0 mLs Intravenous Stopped 04/10/20 0050)  naloxone [facility]) injection 0.4 mg (0.4 mg Intravenous Given 04/09/20 2354)  ondansetron (ZOFRAN) injection 4 mg (4 mg Intravenous Given 04/10/20 0024)  naloxone [facility]) injection 0.4 mg (0.2 mg Intravenous Given 04/10/20 0205)    ED Course  I have reviewed the triage vital signs and the nursing notes.  Pertinent labs & imaging results that were available during my care of the patient were reviewed by me and considered in my medical decision making (see chart for details).  Clinical Course as of 04/10/20 0416  Fri Apr 10, 2020  0008 More alert after Narcan with improved BP. Patient now vomiting. Zofran ordered. [KH]  0046 Reassessed and much more awake and alert. States nausea is presently well controlled. VSS. C/o her leg "jumping". Has restless leg at baseline for which she  takes Mirapex. Unsure if she took this medication tonight. [KH]  0208 RN notified MD Cardama of declining BPs and mental status. She was repositioned in the bed. Patient ordered to receive additional IVF and IV Narcan.  [KH]  4540 Patient mentating well. Vital improving after additional 0.2mg  Narcan. Systolic BP now 112. [KH]  0251 BP back down to 82 systolic. Sats drop to 69% on room air. Currently with sats of 99% on 2L via [place]. Lungs grossly clear after 2L IVF. Ordered to receive IV narcan infusion. Will admit for monitoring. [KH]  0310 Case discussed with PCCM who will assess in the ED for admission. [KH]  [id number] Patient given additional bolus of 0.2mg  Narcan pending set up of Narcan gtt given degree of hypotension. Additional IVF held given CHF hx with reduced EF as her BP has seemed to respond well once Narcan administered. [KH]  0343 BP 124/53 after repeat Narcan dosing. Infusion started. [KH]  0414 Systolic BP back in 80's. RN given verbal to titrate Narcan gtt. Additional 500cc IVF bolus ordered. [KH]    Clinical Course User Index [KH] Darylene Price   MDM Rules/Calculators/A&P                          70 year old female presents to the emergency department for evaluation of altered mental status.  Noted to be hypotensive and bradypneic with hypoxia. Responded to 0.4mg  IV Narcan. With improved mentation, patient does endorse high likelihood of taking additional doses of her prescribed oxycodone; no SI. Has been observed for over 4 hours requiring multiple rounds of IV Narcan to maintain blood pressure and oxygen saturations.  Has been transitioned to a Narcan drip.  No other acute findings on evaluation today.  She will be assessed in the ED by PCCM with planned admission to the ICU.   Final Clinical Impression(s) / ED Diagnoses Final diagnoses:  Opiate overdose, accidental or unintentional, initial encounter [facility])    Rx / DC Orders ED Discharge Orders    None       Antony Madura, PA-C 04/10/20 0352    Nira Conn, MD 04/10/20 [phone]

## 2020-04-10 ENCOUNTER — Emergency Department (HOSPITAL_COMMUNITY): Payer: Medicare HMO

## 2020-04-10 ENCOUNTER — Other Ambulatory Visit (HOSPITAL_COMMUNITY): Payer: Medicare HMO

## 2020-04-10 ENCOUNTER — Observation Stay (HOSPITAL_COMMUNITY): Payer: Medicare HMO

## 2020-04-10 DIAGNOSIS — R4182 Altered mental status, unspecified: Secondary | ICD-10-CM | POA: Diagnosis not present

## 2020-04-10 DIAGNOSIS — T50901A Poisoning by unspecified drugs, medicaments and biological substances, accidental (unintentional), initial encounter: Secondary | ICD-10-CM

## 2020-04-10 DIAGNOSIS — R001 Bradycardia, unspecified: Secondary | ICD-10-CM | POA: Diagnosis not present

## 2020-04-10 DIAGNOSIS — R55 Syncope and collapse: Secondary | ICD-10-CM | POA: Diagnosis not present

## 2020-04-10 DIAGNOSIS — Z8679 Personal history of other diseases of the circulatory system: Secondary | ICD-10-CM | POA: Diagnosis not present

## 2020-04-10 DIAGNOSIS — G9389 Other specified disorders of brain: Secondary | ICD-10-CM | POA: Diagnosis not present

## 2020-04-10 DIAGNOSIS — T402X1A Poisoning by other opioids, accidental (unintentional), initial encounter: Secondary | ICD-10-CM | POA: Diagnosis not present

## 2020-04-10 LAB — CBC WITH DIFFERENTIAL/PLATELET
Abs Immature Granulocytes: 0.02 10*3/uL (ref 0.00–0.07)
Basophils Absolute: 0 10*3/uL (ref 0.0–0.1)
Basophils Relative: 1 %
Eosinophils Absolute: 0.1 10*3/uL (ref 0.0–0.5)
Eosinophils Relative: 2 %
HCT: 34.6 % — ABNORMAL LOW (ref 36.0–46.0)
Hemoglobin: 10.2 g/dL — ABNORMAL LOW (ref 12.0–15.0)
Immature Granulocytes: 1 %
Lymphocytes Relative: 23 %
Lymphs Abs: 0.9 10*3/uL (ref 0.7–4.0)
MCH: 28.2 pg (ref 26.0–34.0)
MCHC: 29.5 g/dL — ABNORMAL LOW (ref 30.0–36.0)
MCV: 95.6 fL (ref 80.0–100.0)
Monocytes Absolute: 0.3 10*3/uL (ref 0.1–1.0)
Monocytes Relative: 8 %
Neutro Abs: 2.6 10*3/uL (ref 1.7–7.7)
Neutrophils Relative %: 65 %
Platelets: 137 10*3/uL — ABNORMAL LOW (ref 150–400)
RBC: 3.62 MIL/uL — ABNORMAL LOW (ref 3.87–5.11)
RDW: 14.6 % (ref 11.5–15.5)
WBC: 4 10*3/uL (ref 4.0–10.5)
nRBC: 0 % (ref 0.0–0.2)

## 2020-04-10 LAB — I-STAT VENOUS BLOOD GAS, ED
Acid-base deficit: 1 mmol/L (ref 0.0–2.0)
Bicarbonate: 27.5 mmol/L (ref 20.0–28.0)
Calcium, Ion: 1.18 mmol/L (ref 1.15–1.40)
HCT: 32 % — ABNORMAL LOW (ref 36.0–46.0)
Hemoglobin: 10.9 g/dL — ABNORMAL LOW (ref 12.0–15.0)
O2 Saturation: 99 %
Potassium: 4.4 mmol/L (ref 3.5–5.1)
Sodium: 140 mmol/L (ref 135–145)
TCO2: 29 mmol/L (ref 22–32)
pCO2, Ven: 63.8 mmHg — ABNORMAL HIGH (ref 44.0–60.0)
pH, Ven: 7.243 — ABNORMAL LOW (ref 7.250–7.430)
pO2, Ven: 158 mmHg — ABNORMAL HIGH (ref 32.0–45.0)

## 2020-04-10 LAB — BASIC METABOLIC PANEL
Anion gap: 7 (ref 5–15)
BUN: 12 mg/dL (ref 8–23)
CO2: 22 mmol/L (ref 22–32)
Calcium: 7.8 mg/dL — ABNORMAL LOW (ref 8.9–10.3)
Chloride: 111 mmol/L (ref 98–111)
Creatinine, Ser: 0.85 mg/dL (ref 0.44–1.00)
GFR, Estimated: >60 mL/min (ref >60)
Glucose, Bld: 95 mg/dL (ref 70–99)
Potassium: 3.7 mmol/L (ref 3.5–5.1)
Sodium: 140 mmol/L (ref 135–145)

## 2020-04-10 LAB — CBC
HCT: 33.3 % — ABNORMAL LOW (ref 36.0–46.0)
Hemoglobin: 10.2 g/dL — ABNORMAL LOW (ref 12.0–15.0)
MCH: 29 pg (ref 26.0–34.0)
MCHC: 30.6 g/dL (ref 30.0–36.0)
MCV: 94.6 fL (ref 80.0–100.0)
Platelets: 132 10*3/uL — ABNORMAL LOW (ref 150–400)
RBC: 3.52 MIL/uL — ABNORMAL LOW (ref 3.87–5.11)
RDW: 14.5 % (ref 11.5–15.5)
WBC: 4.5 10*3/uL (ref 4.0–10.5)
nRBC: 0 % (ref 0.0–0.2)

## 2020-04-10 LAB — COMPREHENSIVE METABOLIC PANEL
ALT: 14 U/L (ref 0–44)
AST: 16 U/L (ref 15–41)
Albumin: 3 g/dL — ABNORMAL LOW (ref 3.5–5.0)
Alkaline Phosphatase: 57 U/L (ref 38–126)
Anion gap: 8 (ref 5–15)
BUN: 14 mg/dL (ref 8–23)
CO2: 26 mmol/L (ref 22–32)
Calcium: 8.6 mg/dL — ABNORMAL LOW (ref 8.9–10.3)
Chloride: 106 mmol/L (ref 98–111)
Creatinine, Ser: 0.98 mg/dL (ref 0.44–1.00)
GFR, Estimated: >60 mL/min (ref >60)
Glucose, Bld: 82 mg/dL (ref 70–99)
Potassium: 4.4 mmol/L (ref 3.5–5.1)
Sodium: 140 mmol/L (ref 135–145)
Total Bilirubin: 0.4 mg/dL (ref 0.3–1.2)
Total Protein: 6.2 g/dL — ABNORMAL LOW (ref 6.5–8.1)

## 2020-04-10 LAB — URINALYSIS, ROUTINE W REFLEX MICROSCOPIC
Bilirubin Urine: NEGATIVE
Glucose, UA: NEGATIVE mg/dL
Hgb urine dipstick: NEGATIVE
Ketones, ur: NEGATIVE mg/dL
Leukocytes,Ua: NEGATIVE
Nitrite: NEGATIVE
Protein, ur: NEGATIVE mg/dL
Specific Gravity, Urine: 1.009 (ref 1.005–1.030)
pH: 5 (ref 5.0–8.0)

## 2020-04-10 LAB — RAPID URINE DRUG SCREEN, HOSP PERFORMED
Amphetamines: NOT DETECTED
Barbiturates: NOT DETECTED
Benzodiazepines: POSITIVE — AB
Cocaine: POSITIVE — AB
Opiates: POSITIVE — AB
Tetrahydrocannabinol: POSITIVE — AB

## 2020-04-10 LAB — MAGNESIUM: Magnesium: 1.5 mg/dL — ABNORMAL LOW (ref 1.7–2.4)

## 2020-04-10 LAB — SARS CORONAVIRUS 2 BY RT PCR (HOSPITAL ORDER, PERFORMED IN ~~LOC~~ HOSPITAL LAB): SARS Coronavirus 2: NEGATIVE

## 2020-04-10 LAB — ETHANOL: Alcohol, Ethyl (B): <10 mg/dL (ref <10)

## 2020-04-10 LAB — GLUCOSE, CAPILLARY: Glucose-Capillary: 89 mg/dL (ref 70–99)

## 2020-04-10 LAB — PHOSPHORUS: Phosphorus: 3.7 mg/dL (ref 2.5–4.6)

## 2020-04-10 LAB — MRSA PCR SCREENING: MRSA by PCR: POSITIVE — AB

## 2020-04-10 MED ORDER — ONDANSETRON HCL 4 MG/2ML IJ SOLN: 4.0000 mg | Freq: Once | INTRAMUSCULAR | Status: AC

## 2020-04-10 MED ORDER — SODIUM CHLORIDE 0.9 % IV BOLUS
500.0000 mL | Freq: Once | INTRAVENOUS | Status: AC
  Administered 2020-04-10: 500 mL via INTRAVENOUS

## 2020-04-10 MED ORDER — ONDANSETRON HCL 4 MG/2ML IJ SOLN
INTRAMUSCULAR | Status: AC
  Administered 2020-04-10: 4 mg via INTRAVENOUS
  Filled 2020-04-10: qty 2

## 2020-04-10 MED ORDER — AMIODARONE HCL 200 MG PO TABS
100.0000 mg | ORAL_TABLET | Freq: Every day | ORAL | Status: DC
Start: 2020-04-10 — End: 2020-04-12
  Administered 2020-04-10 – 2020-04-11 (×2): 100 mg via ORAL
  Filled 2020-04-10 (×2): qty 1

## 2020-04-10 MED ORDER — ATORVASTATIN CALCIUM 80 MG PO TABS
80.0000 mg | ORAL_TABLET | Freq: Every day | ORAL | Status: DC
  Administered 2020-04-10 – 2020-04-11 (×2): 80 mg via ORAL
  Filled 2020-04-10 (×2): qty 1

## 2020-04-10 MED ORDER — APIXABAN 5 MG PO TABS
5.0000 mg | ORAL_TABLET | Freq: Two times a day (BID) | ORAL | Status: DC
  Administered 2020-04-10 – 2020-04-11 (×3): 5 mg via ORAL
  Filled 2020-04-10 (×3): qty 1

## 2020-04-10 MED ORDER — CHLORHEXIDINE GLUCONATE CLOTH 2 % EX PADS
6.0000 | MEDICATED_PAD | Freq: Every day | CUTANEOUS | Status: DC
  Administered 2020-04-10 – 2020-04-11 (×2): 6 via TOPICAL

## 2020-04-10 MED ORDER — DOCUSATE SODIUM 100 MG PO CAPS
100.0000 mg | ORAL_CAPSULE | Freq: Two times a day (BID) | ORAL | Status: DC | PRN
Start: 2020-04-10 — End: 2020-04-12

## 2020-04-10 MED ORDER — EZETIMIBE 10 MG PO TABS
10.0000 mg | ORAL_TABLET | Freq: Every day | ORAL | Status: DC
Start: 2020-04-10 — End: 2020-04-12
  Administered 2020-04-10 – 2020-04-11 (×2): 10 mg via ORAL
  Filled 2020-04-10 (×2): qty 1

## 2020-04-10 MED ORDER — POLYETHYLENE GLYCOL 3350 17 G PO PACK: 17.0000 g | PACK | Freq: Every day | ORAL | Status: DC | PRN

## 2020-04-10 MED ORDER — SODIUM CHLORIDE 0.9 % IV SOLN
1000.0000 mL | INTRAVENOUS | Status: DC
  Administered 2020-04-10: 1000 mL via INTRAVENOUS

## 2020-04-10 MED ORDER — MAGNESIUM SULFATE 2 GM/50ML IV SOLN
2.0000 g | Freq: Once | INTRAVENOUS | Status: AC
  Administered 2020-04-10: 2 g via INTRAVENOUS
  Filled 2020-04-10: qty 50

## 2020-04-10 MED ORDER — ALPRAZOLAM 0.5 MG PO TABS
0.5000 mg | ORAL_TABLET | Freq: Three times a day (TID) | ORAL | Status: DC | PRN
  Administered 2020-04-10 – 2020-04-11 (×2): 0.5 mg via ORAL
  Filled 2020-04-10 (×2): qty 1

## 2020-04-10 MED ORDER — SODIUM CHLORIDE 0.9 % IV BOLUS (SEPSIS)
1000.0000 mL | Freq: Once | INTRAVENOUS | Status: AC
  Administered 2020-04-10: 1000 mL via INTRAVENOUS

## 2020-04-10 MED ORDER — MUPIROCIN 2 % EX OINT
1.0000 "application " | TOPICAL_OINTMENT | Freq: Two times a day (BID) | CUTANEOUS | Status: DC
  Administered 2020-04-10 (×2): 1 via NASAL
  Filled 2020-04-10: qty 22

## 2020-04-10 MED ORDER — NALOXONE HCL 4 MG/10ML IJ SOLN
0.4000 mg/h | INTRAVENOUS | Status: DC
  Administered 2020-04-10: 0.25 mg/h via INTRAVENOUS
  Filled 2020-04-10: qty 10

## 2020-04-10 MED ORDER — ALBUTEROL SULFATE (2.5 MG/3ML) 0.083% IN NEBU: 2.5000 mg | INHALATION_SOLUTION | Freq: Four times a day (QID) | RESPIRATORY_TRACT | Status: DC | PRN

## 2020-04-10 MED ORDER — ASPIRIN 81 MG PO CHEW
81.0000 mg | CHEWABLE_TABLET | Freq: Every day | ORAL | Status: DC
  Administered 2020-04-10 – 2020-04-11 (×2): 81 mg via ORAL
  Filled 2020-04-10 (×2): qty 1

## 2020-04-10 MED ORDER — ORAL CARE MOUTH RINSE
15.0000 mL | Freq: Two times a day (BID) | OROMUCOSAL | Status: DC
  Administered 2020-04-10 (×2): 15 mL via OROMUCOSAL

## 2020-04-10 MED ORDER — NALOXONE HCL 0.4 MG/ML IJ SOLN
0.4000 mg | Freq: Once | INTRAMUSCULAR | Status: AC
  Administered 2020-04-10: 0.2 mg via INTRAVENOUS
  Filled 2020-04-10: qty 1

## 2020-04-10 MED ORDER — POTASSIUM CHLORIDE CRYS ER 20 MEQ PO TBCR
40.0000 meq | EXTENDED_RELEASE_TABLET | Freq: Once | ORAL | Status: AC
  Administered 2020-04-10: 40 meq via ORAL
  Filled 2020-04-10: qty 2

## 2020-04-10 MED ORDER — RANOLAZINE ER 500 MG PO TB12
1000.0000 mg | ORAL_TABLET | Freq: Two times a day (BID) | ORAL | Status: DC
  Administered 2020-04-10 – 2020-04-11 (×3): 1000 mg via ORAL
  Filled 2020-04-10 (×5): qty 2

## 2020-04-10 MED ORDER — SACUBITRIL-VALSARTAN 24-26 MG PO TABS
1.0000 | ORAL_TABLET | Freq: Two times a day (BID) | ORAL | Status: DC
  Administered 2020-04-10 – 2020-04-11 (×3): 1 via ORAL
  Filled 2020-04-10 (×5): qty 1

## 2020-04-10 NOTE — ED Notes (Signed)
Pharmacy consulted and verified Narcan drip titration to 0.4mg /hr at direction of [place] PA.

## 2020-04-10 NOTE — H&P (Signed)
NAME:  Wendy [name], MRN:  [id number], DOB:  09/29/1950, LOS: 0 ADMISSION DATE:  04/09/2020, CONSULTATION DATE: 04/10/2020 REFERRING MD: EDP, CHIEF COMPLAINT: Drug overdose requiring Narcan  Brief History:   70 year old with asthma, COPD, CHF, OSA found unconscious on the couch with hypotension, shallow respiration.  Given Narcan IV x3 in the field in ED with temporary improvement.  She eventually started on Narcan drip with improvement in blood pressure, O2 saturation and mental status.  PCCM consulted for admission.  She has prescriptions for pain medication but no history of abuse or suicidal ideation noted.  Past Medical History:    has a past medical history of Anemia, Anginal pain ([place]), Anxiety, Arthritis, Asthma, Chornic Obstructive Pulmonary Disease, Chronic back pain, Chronic combined systolic and diastolic CHF, Chronic leg pain, Chronic lower back pain, Coronary Artery Disease, Depression, Fall (08/2016), Gastroesophageal reflux disease, Glucose intolerance, Headache, HLD (hyperlipidemia), Hx of DVT (X 3), Hypertension, Myocardial infarction ([place]) (1999), OSA on CPAP, Paroxysmal atrial fibrillation, Peripheral artery disease, Peripheral neuropathy, Pneumonia, Shortness of breath, and Wears glasses.  Significant Hospital Events:   2/4-admit  Consults:   PCCM  Procedures:    Significant Diagnostic Tests:  Chest x-ray 2/4-no acute abnormality.  I reviewed the images personally  CT head pending   Micro Data:    Antimicrobials:    Interim History / Subjective:    Objective   Blood pressure (!) 76/39, pulse 66, temperature 98.2 F (36.8 C), temperature source Oral, resp. rate 13, SpO2 96 %.        Intake/Output Summary (Last 24 hours) at 04/10/2020 0323 Last data filed at 04/10/2020 0256 Gross per 24 hour  Intake 2500 ml  Output --  Net 2500 ml   There were no vitals filed for this visit.  Examination: Blood pressure (!) 124/53, pulse 72, temperature 98.2 F  (36.8 C), temperature source Oral, resp. rate 15, SpO2 100 %. Gen:      No acute distress HEENT:  EOMI, sclera anicteric Neck:     No masses; no thyromegaly Lungs:    Clear to auscultation bilaterally; normal respiratory effort CV:         Regular rate and rhythm; no murmurs Abd:      + bowel sounds; soft, non-tender; no palpable masses, no distension Ext:    No edema; adequate peripheral perfusion Skin:      Warm and dry; no rash Neuro: Somnolent.  Arousable and oriented to time but falls asleep.  No focal deficits  Resolved Hospital Problem list     Assessment & Plan:  Suspected unintentional drug overdose Continue Narcan drip, admit to ICU for monitoring and overnight observation CT head Check urine drug screen  Ischemic cardiomyopathy, heart failure with EF 45% Hypertension, paroxysmal atrial fibrillation on Eliquis Resume home meds  Depression Holding medication due to altered mental    Best practice (evaluated daily)  Diet: NPO Pain/Anxiety/Delirium protocol (if indicated): NA VAP protocol (if indicated): NA DVT prophylaxis: Eliquis GI prophylaxis: PPI Glucose control: Monitor Mobility: Bed Disposition:Full  Goals of Care:  Last date of multidisciplinary goals of care discussion:Pending Family and staff present: Sister at bedside Summary of discussion:  Follow up goals of care discussion due: 04/17/20 Code Status: Full  Labs   CBC: Recent Labs  Lab 04/09/20 2354 04/10/20 0023  WBC 4.0  --   NEUTROABS 2.6  --   HGB 10.2* 10.9*  HCT 34.6* 32.0*  MCV 95.6  --   PLT 137*  --  Basic Metabolic Panel: Recent Labs  Lab 04/10/20 0023 04/10/20 0138  NA 140 140  K 4.4 4.4  CL  --  106  CO2  --  26  GLUCOSE  --  82  BUN  --  14  CREATININE  --  0.98  CALCIUM  --  8.6*   GFR: CrCl cannot be calculated (Unknown ideal weight.). Recent Labs  Lab 04/09/20 2354  WBC 4.0    Liver Function Tests: Recent Labs  Lab 04/10/20 0138  AST 16  ALT 14   ALKPHOS 57  BILITOT 0.4  PROT 6.2*  ALBUMIN 3.0*   No results for input(s): LIPASE, AMYLASE in the last 168 hours. No results for input(s): AMMONIA in the last 168 hours.  ABG    Component Value Date/Time   PHART 7.461 (H) 06/25/2019 0423   PCO2ART 34.3 06/25/2019 0423   PO2ART 180.0 (H) 06/25/2019 0423   HCO3 27.5 04/10/2020 0023   TCO2 29 04/10/2020 0023   ACIDBASEDEF 1.0 04/10/2020 0023   O2SAT 99.0 04/10/2020 0023     Coagulation Profile: No results for input(s): INR, PROTIME in the last 168 hours.  Cardiac Enzymes: No results for input(s): CKTOTAL, CKMB, CKMBINDEX, TROPONINI in the last 168 hours.  HbA1C: Hgb A1c MFr Bld  Date/Time Value Ref Range Status  06/24/2019 11:18 PM 5.4 4.8 - 5.6 % Final    Comment:    (NOTE) Pre diabetes:          5.7%-6.4% Diabetes:              >6.4% Glycemic control for   <7.0% adults with diabetes   08/15/2016 04:39 PM 4.9 4.8 - 5.6 % Final    Comment:    (NOTE)         Pre-diabetes: 5.7 - 6.4         Diabetes: >6.4         Glycemic control for adults with diabetes: <7.0     CBG: No results for input(s): GLUCAP in the last 168 hours.  Review of Systems:    Unable to obtain due to encephalopathy  Past Medical History:  She,  has a past medical history of Anemia, Anginal pain ([place]), Anxiety, Arthritis, Asthma, Chornic Obstructive Pulmonary Disease, Chronic back pain, Chronic combined systolic and diastolic CHF, Chronic leg pain, Chronic lower back pain, Coronary Artery Disease, Depression, Fall (08/2016), Gastroesophageal reflux disease, Glucose intolerance, Headache, HLD (hyperlipidemia), Hx of DVT (X 3), Hypertension, Myocardial infarction ([place]) (1999), OSA on CPAP, Paroxysmal atrial fibrillation, Peripheral artery disease, Peripheral neuropathy, Pneumonia, Shortness of breath, and Wears glasses.   Surgical History:   Past Surgical History:  Procedure Laterality Date  . ABDOMINAL AORTOGRAM W/LOWER EXTREMITY Bilateral  04/29/2019   Procedure: ABDOMINAL AORTOGRAM W/LOWER EXTREMITY;  Surgeon: Waynetta Sandy, MD;  Location: [place] [facility];  Service: Cardiovascular;  Laterality: Bilateral;  . ANTERIOR CERVICAL DECOMP/DISCECTOMY FUSION  1991  . AORTA - BILATERAL FEMORAL ARTERY BYPASS GRAFT N/A 06/18/2019   Procedure: AORTA BIFEMORAL BYPASS GRAFT;  Surgeon: Waynetta Sandy, MD;  Location: [place];  Service: Vascular;  Laterality: N/A;  . ARTERY REPAIR Left 06/30/2019   Procedure: LEFT BRACHIAL ARTERY REPAIR, EVACUATION OF HEMATOMA;  Surgeon: Serafina Mitchell, MD;  Location: [place];  Service: Vascular;  Laterality: Left;  . BACK SURGERY    . CARDIAC CATHETERIZATION N/A 01/22/2015   Procedure: Left Heart Cath and Coronary Angiography;  Surgeon: Troy Sine, MD;  Location: [place] [facility];  Service: Cardiovascular;  Laterality: N/A;  . CARDIAC CATHETERIZATION N/A 01/22/2015   Procedure: Coronary Stent Intervention;  Surgeon: Troy Sine, MD;  Location: [place] [facility];  Service: Cardiovascular;  Laterality: N/A;  3.0x12 xience prox SVG to RCA  . CARDIAC CATHETERIZATION  03/09/2000   hx/notes 03/20/2000  . CHOLECYSTECTOMY N/A 09/16/2013   Procedure: LAPAROSCOPIC CHOLECYSTECTOMY CONVERTED TO OPEN CHOLECYSTECTOMY WITH CHOLANGIOGRAM;  Surgeon: [name], MD;  Location: [place];  Service: General;  Laterality: N/A;  . CORONARY ANGIOGRAPHY N/A 06/27/2019   Procedure: CORONARY ANGIOGRAPHY (CATH LAB);  Surgeon: Sherren Mocha, MD;  Location: [facility];  Service: Cardiovascular;  Laterality: N/A;  . CORONARY ANGIOPLASTY WITH STENT PLACEMENT  11/2011, 02/2012, 01/2015   DES to SVG-RCA both times, In [place], [place], Dr. Bruce Donath  . CORONARY ARTERY BYPASS GRAFT  1998   LIMA-LAD, SVG-OM, SVG-RCA  . ERCP N/A 09/19/2013   Procedure: ENDOSCOPIC RETROGRADE CHOLANGIOPANCREATOGRAPHY (ERCP);  Surgeon: [name], MD;  Location: [place];  Service: Endoscopy;  Laterality: N/A;  .  FINGER SURGERY Right    ring finger "fused"  . FRACTURE SURGERY    . IRRIGATION AND DEBRIDEMENT ABSCESS Right 10/17/2013   Procedure: INCISION AND DRAINAGE RIGHT SUBCOSTAL WOUND;  Surgeon: [name], MD;  Location: [facility];  Service: General;  Laterality: Right;  . JOINT REPLACEMENT    . KNEE ARTHROSCOPY Right 1998  . LEFT HEART CATH AND CORS/GRAFTS ANGIOGRAPHY N/A 07/25/2016   Procedure: Left Heart Cath and Cors/Grafts Angiography;  Surgeon: Jettie Booze, MD;  Location: [place] [facility];  Service: Cardiovascular;  Laterality: N/A;  . LEFT HEART CATH AND CORS/GRAFTS ANGIOGRAPHY N/A 06/05/2019   Procedure: LEFT HEART CATH AND CORS/GRAFTS ANGIOGRAPHY;  Surgeon: Burnell Blanks, MD;  Location: [place] [facility];  Service: Cardiovascular;  Laterality: N/A;  . LEFT HEART CATHETERIZATION WITH CORONARY/GRAFT ANGIOGRAM N/A 07/19/2013   Procedure: LEFT HEART CATHETERIZATION WITH Beatrix Fetters;  Surgeon: Leonie Man, MD;  Location[facility];  Service: Cardiovascular;  Laterality: N/A;  . MASS EXCISION Right 06/25/2014   Procedure: EXCISION BUTTOCK MASS ;  Surgeon: Coralie Keens, MD;  Location: [place];  Service: General;  Laterality: Right;  . ORIF HUMERUS FRACTURE Left 08/27/2013   Procedure: OPEN REDUCTION INTERNAL FIXATION (ORIF) LEFT HUMERUS ;  Surgeon: Rozanna Box, MD;  Location: [place];  Service: Orthopedics;  Laterality: Left;  . PLACEMENT OF IMPELLA LEFT VENTRICULAR ASSIST DEVICE N/A 06/24/2019   Procedure: PLACEMENT OF IMPELLA LEFT VENTRICULAR ASSIST DEVICE;  Surgeon: Wonda Olds, MD;  Location: [place];  Service: Open Heart Surgery;  Laterality: N/A;  . [place]  . REMOVAL OF IMPELLA LEFT VENTRICULAR ASSIST DEVICE Right 06/28/2019   Procedure: REMOVAL OF IMPELLA LEFT VENTRICULAR ASSIST DEVICE;  Surgeon: Wonda Olds, MD;  Location: [place];  Service: Open Heart Surgery;  Laterality: Right;  .  THROMBECTOMY / EMBOLECTOMY FEMORAL ARTERY Right    hx/notes 03/20/2000  . TOTAL HIP ARTHROPLASTY Right 07/19/2016   Procedure: TOTAL HIP ARTHROPLASTY ANTERIOR APPROACH;  Surgeon: [name], MD;  Location: [place];  Service: Orthopedics;  Laterality: Right;  . TOTAL HIP REVISION Right 08/22/2017   Procedure: TOTAL HIP REVISION;  Surgeon: [name], MD;  Location: [place];  Service: Orthopedics;  Laterality: Right;     Social History:   reports that she quit smoking about 21 years ago. Her smoking use included cigarettes. She has a  17.00 pack-year smoking history. She has never used smokeless tobacco. She reports previous alcohol use. She reports current drug use. Drug: Marijuana.   Family History:  Her family history includes Clotting disorder in her father; Heart disease in her brother and brother; Lung cancer in her mother and sister. There is no history of Colon cancer or Breast cancer.   Allergies Allergies  Allergen Reactions  . Benadryl [Diphenhydramine] Shortness Of Breath  . Penicillins Diarrhea    Did it involve swelling of the face/tongue/throat, SOB, or low BP? No Did it involve sudden or severe rash/hives, skin peeling, or any reaction on the inside of your mouth or nose? No Did you need to seek medical attention at a hospital or doctor's office? No When did it last happen?2 months If all above answers are "NO", may proceed with cephalosporin use.  . Adhesive [Tape] Other (See Comments)    Burning of skin  . Amoxicillin Diarrhea and Other (See Comments)    Has patient had a PCN reaction causing immediate rash, facial/tongue/throat swelling, SOB or lightheadedness with hypotension: No Has patient had a PCN reaction causing severe rash involving mucus membranes or skin necrosis: No Has patient had a PCN reaction that required hospitalization No Has patient had a PCN reaction occurring within the last 10 years: Yes -- noted rxn as diarrhea If all of the above  answers are "NO", then may proceed with Cephalosporin use.   Marland Kitchen Hydrocodone-Acetaminophen Other (See Comments)  . Cyclobenzaprine Other (See Comments)    Causes restless leg syndrome Restless syndrome  . Hydrocodone Itching     Home Medications  Prior to Admission medications   Medication Sig Start Date End Date Taking? Authorizing Provider  acetaminophen (TYLENOL) 500 MG tablet Take 500-1,000 mg by mouth every 6 (six) hours as needed (for pain.).    [provider]  albuterol (PROVENTIL HFA;VENTOLIN HFA) 108 (90 Base) MCG/ACT inhaler Inhale 2 puffs into the lungs every 6 (six) hours as needed for wheezing or shortness of breath. 01/01/18   Bhagat, Crista Luria, PA  ALPRAZolam (XANAX) 0.5 MG tablet Take 0.5 mg by mouth 3 (three) times daily as needed for anxiety.    [provider]  amiodarone (PACERONE) 100 MG tablet Take 1 tablet (100 mg total) by mouth daily. 08/15/19   Nahser, Wonda Cheng, MD  apixaban (ELIQUIS) 5 MG TABS tablet Take 1 tablet (5 mg total) by mouth 2 (two) times daily. 11/20/18   Nahser, Wonda Cheng, MD  aspirin 81 MG chewable tablet Chew 1 tablet (81 mg total) by mouth daily. 07/10/19   Dagoberto Ligas, PA-C  atorvastatin (LIPITOR) 80 MG tablet Take 1 tablet (80 mg total) by mouth daily. 01/02/20   Nahser, Wonda Cheng, MD  buPROPion (WELLBUTRIN XL) 150 MG 24 hr tablet Take 150 mg by mouth daily.    [provider]  diclofenac Sodium (VOLTAREN) 1 % GEL Apply 1 g topically 4 (four) times daily as needed for pain. 02/15/19   [provider]  docusate sodium (STOOL SOFTENER) 100 MG capsule 1 capsule as needed    [provider]  ezetimibe (ZETIA) 10 MG tablet Take 10 mg by mouth daily.    [provider]  furosemide (LASIX) 20 MG tablet Take 1 tablet (20 mg total) by mouth every other day. Please call and schedule an appointment for further refills 1st attempt 01/03/20   Nahser, Wonda Cheng, MD  gabapentin (NEURONTIN) 300 MG capsule Take  300 mg by mouth 3 (three) times  daily as needed for pain. 02/15/19   [provider]  melatonin 5 MG TABS 1 tablet in the evening    [provider]  metoprolol tartrate (LOPRESSOR) 50 MG tablet Take by mouth. 10/18/19   [provider]  nitroGLYCERIN (NITROSTAT) 0.4 MG SL tablet Place 1 tablet (0.4 mg total) under the tongue every 5 (five) minutes as needed for chest pain. 11/20/18   Nahser, Wonda Cheng, MD  oxyCODONE 10 MG TABS Take 1 tablet (10 mg total) by mouth 3 (three) times daily. 07/12/19   Ulyses Amor, PA-C  pantoprazole (PROTONIX) 40 MG tablet TAKE ONE TABLET BY MOUTH EVERY MORNING 03/30/20   Nahser, Wonda Cheng, MD  pramipexole (MIRAPEX) 0.25 MG tablet Take 0.25 mg by mouth at bedtime.  02/18/19   [provider]  promethazine (PHENERGAN) 12.5 MG tablet TAKE ONE TABLET BY MOUTH EVERY 6 HOURS AS NEEDED FOR NAUSEA AND VOMITING 11/06/19   Waynetta Sandy, MD  ranolazine (RANEXA) 1000 MG SR tablet Take 1,000 mg by mouth 2 (two) times daily.    [provider]  sacubitril-valsartan (ENTRESTO) 24-26 MG Take 1 tablet by mouth 2 (two) times daily. 12/06/19   Nahser, Wonda Cheng, MD  sertraline (ZOLOFT) 100 MG tablet Take 150 mg by mouth at bedtime.     [provider]     Critical care time:    The patient is critically ill with multiple organ system failure and requires high complexity decision making for assessment and support, frequent evaluation and titration of therapies, advanced monitoring, review of radiographic studies and interpretation of complex data.   Critical Care Time devoted to patient care services, exclusive of separately billable procedures, described in this note is 35 minutes.   Marshell Garfinkel MD [facility] See Amion for pager  If no response to pager , please call [phone] until 7pm After 7:00 pm call Elink  [phone] 04/10/2020, 4:02 AM

## 2020-04-10 NOTE — Progress Notes (Signed)
Pt transferred to 6N22 with suicide sitter and sister [name], report given to RN, all questions answered, pt A/O x4, transported via wheelchair on RA. VSS, all belonging taken with patient.

## 2020-04-10 NOTE — Progress Notes (Signed)
eLink Physician-Brief Progress Note Patient Name: Wendy [name] DOB: Mar 28, 1950 MRN: [id number]   Date of Service  04/10/2020  HPI/Events of Note  Patient admitted via EMS and ED with acute hypoxic / hypercapnic respiratory failure secondary to polysubstance abuse with overdose, she responded to Narcan and is currently on a Narcan infusion.  eICU Interventions  New Patient Evaluation completed. Narcan infusion order modified to reflect that she is on a Narcan infusion at 0.4 mg  / hour.        [name] 04/10/2020, 6:39 AM
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
## 2020-04-10 NOTE — Progress Notes (Signed)
NAME:  Wendy [name], MRN:  [id number], DOB:  1950/06/10, LOS: 0 ADMISSION DATE:  04/09/2020, CONSULTATION DATE:  2/4 REFERRING MD:  EDP, CHIEF COMPLAINT:  Drug overdose   Brief History:  70 y/o female with multiple medical problems admitted with a narcotic overdose of unclear intent.  Past Medical History:  Anemia COPD CHF OSA Systolic/diastolic CHF CAD Depression GERD Hyperlipidemia DVT Hypertension Paroxysmal atrial fibrillation PAD  Significant Hospital Events:  2/4 admission  Consults:  PCCM  Procedures:    Significant Diagnostic Tests:  2/4 CT head > NAICP  Micro Data:  2/4 SARS COV 2 > negative 2/3 urine culture >  Antimicrobials:    Interim History / Subjective:   Awake Has restless legs this morning Wants to eat  Wants narcan off Says she took the narcotics yesterday in excess "because she was mad"  Denies suicidal thoughts today  Objective   Blood pressure (!) 111/46, pulse (!) 58, temperature 97.9 F (36.6 C), temperature source Oral, resp. rate 12, weight 57.5 kg, SpO2 100 %.        Intake/Output Summary (Last 24 hours) at 04/10/2020 0751 Last data filed at 04/10/2020 0600 Gross per 24 hour  Intake 2557.72 ml  Output -  Net 2557.72 ml   Filed Weights   04/10/20 0700  Weight: 57.5 kg    Examination: General:  Resting comfortably in bed HENT: NCAT OP clear PULM: CTA B, normal effort CV: RRR, no mgr GI: BS+, soft, nontender MSK: normal bulk and tone Neuro: awake, alert, no distress, MAEW   Resolved Hospital Problem list     Assessment & Plan:  Intentional narcotic overdose in state of emotional distress Suicide precautions Psychiatry consulted Stop narcan Hold home narcotics  Ischemic cardiomyopathy Atrial fibrillation Continue: amiodarone eliquis lipitor zetia entresto ranexa ASA  COPD by report, no pft on record Prn albuterol  Depression Holding as we haven't been able to get med reconciliation Psyche  consult   Best practice (evaluated daily)  Diet: advance Pain/Anxiety/Delirium protocol (if indicated): n/a VAP protocol (if indicated): n/a DVT prophylaxis: elquis GI prophylaxis: n/a Glucose control: monitor Mobility: bedrest Disposition:to med-surg floor  Goals of Care:  Last date of multidisciplinary goals of care discussion:2/3 Family and staff present:  Summary of discussion:  Follow up goals of care discussion due:  Code Status: full  Labs   CBC: Recent Labs  Lab 04/09/20 2354 04/10/20 0023 04/10/20 0543  WBC 4.0  --  4.5  NEUTROABS 2.6  --   --   HGB 10.2* 10.9* 10.2*  HCT 34.6* 32.0* 33.3*  MCV 95.6  --  94.6  PLT 137*  --  132*    Basic Metabolic Panel: Recent Labs  Lab 04/10/20 0023 04/10/20 0138 04/10/20 0543  NA 140 140 140  K 4.4 4.4 3.7  CL  --  106 111  CO2  --  26 22  GLUCOSE  --  82 95  BUN  --  14 12  CREATININE  --  0.98 0.85  CALCIUM  --  8.6* 7.8*  MG  --   --  1.5*  PHOS  --   --  3.7   GFR: Estimated Creatinine Clearance: 51.7 mL/min (by C-G formula based on SCr of 0.85 mg/dL). Recent Labs  Lab 04/09/20 2354 04/10/20 0543  WBC 4.0 4.5    Liver Function Tests: Recent Labs  Lab 04/10/20 0138  AST 16  ALT 14  ALKPHOS 57  BILITOT 0.4  PROT 6.2*  ALBUMIN  3.0*   No results for input(s): LIPASE, AMYLASE in the last 168 hours. No results for input(s): AMMONIA in the last 168 hours.  ABG    Component Value Date/Time   PHART 7.461 (H) 06/25/2019 0423   PCO2ART 34.3 06/25/2019 0423   PO2ART 180.0 (H) 06/25/2019 0423   HCO3 27.5 04/10/2020 0023   TCO2 29 04/10/2020 0023   ACIDBASEDEF 1.0 04/10/2020 0023   O2SAT 99.0 04/10/2020 0023     Coagulation Profile: No results for input(s): INR, PROTIME in the last 168 hours.  Cardiac Enzymes: No results for input(s): CKTOTAL, CKMB, CKMBINDEX, TROPONINI in the last 168 hours.  HbA1C: Hgb A1c MFr Bld  Date/Time Value Ref Range Status  06/24/2019 11:18 PM 5.4 4.8 - 5.6 %  Final    Comment:    (NOTE) Pre diabetes:          5.7%-6.4% Diabetes:              >6.4% Glycemic control for   <7.0% adults with diabetes   08/15/2016 04:39 PM 4.9 4.8 - 5.6 % Final    Comment:    (NOTE)         Pre-diabetes: 5.7 - 6.4         Diabetes: >6.4         Glycemic control for adults with diabetes: <7.0     CBG: Recent Labs  Lab 04/10/20 0621  GLUCAP 89     Critical care time: n/a    Roselie Awkward, MD [facility] Pager: [phone] Cell: [phone] If no response, call [phone]

## 2020-04-11 DIAGNOSIS — I255 Ischemic cardiomyopathy: Secondary | ICD-10-CM | POA: Diagnosis not present

## 2020-04-11 DIAGNOSIS — T402X1A Poisoning by other opioids, accidental (unintentional), initial encounter: Secondary | ICD-10-CM | POA: Diagnosis not present

## 2020-04-11 DIAGNOSIS — T50902A Poisoning by unspecified drugs, medicaments and biological substances, intentional self-harm, initial encounter: Secondary | ICD-10-CM | POA: Diagnosis not present

## 2020-04-11 DIAGNOSIS — I10 Essential (primary) hypertension: Secondary | ICD-10-CM

## 2020-04-11 DIAGNOSIS — T50904A Poisoning by unspecified drugs, medicaments and biological substances, undetermined, initial encounter: Secondary | ICD-10-CM | POA: Diagnosis not present

## 2020-04-11 DIAGNOSIS — F1910 Other psychoactive substance abuse, uncomplicated: Secondary | ICD-10-CM

## 2020-04-11 LAB — URINE CULTURE: Culture: <10000 — AB

## 2020-04-11 NOTE — Plan of Care (Signed)
  Problem: Education: Goal: Knowledge of General Education information will improve Description: Including pain rating scale, medication(s)/side effects and non-pharmacologic comfort measures Outcome: Progressing   Problem: Clinical Measurements: Goal: Ability to maintain clinical measurements within normal limits will improve Outcome: Progressing   

## 2020-04-11 NOTE — Discharge Summary (Signed)
Discharge Summary  Wendy [name] [id number] DOB: May 27, 1950  PCP: Josetta Huddle, MD  Admit date: 04/09/2020 Discharge date: 04/11/2020  Time spent: 30 mins  Recommendations for Outpatient Follow-up:  1. Follow-up with PCP in 1 week  Discharge Diagnoses:  Active Hospital Problems   Diagnosis Date Noted  . Drug overdose 04/10/2020    Resolved Hospital Problems  No resolved problems to display.    Discharge Condition: Stable  Diet recommendation: Heart healthy  Vitals:   04/11/20 0608 04/11/20 1415  BP: (!) 115/58 (!) 150/71  Pulse: (!) 57 (!) 56  Resp: 18 17  Temp:  97.9 F (36.6 C)  SpO2: 98% 100%    History of present illness:  HPI from PCCM: 70 year old with asthma, COPD, CHF, OSA found unconscious on the couch with hypotension, shallow respiration.  Given Narcan IV x3 in the field in ED with temporary improvement.  She eventually started on Narcan drip with improvement in blood pressure, O2 saturation and mental status. PCCM consulted for admission. Pt has prescriptions for pain medication but no history of abuse or suicidal ideation noted.  Patient was admitted to ICU for close observation.  [facility] assumed care on 04/11/2020.   Today, patient denies any new complaints, very eager to be discharged.  Patient denies suicidal intentions but unable to describe why she took so much of her pain meds.  Psychiatry was reconsulted today, for reevaluation, recommended patient be discharged, does not meet inpatient psych admission.  Discussed with Eugene Gavia, who agreed that it was not an intentional overdose. Pt denies any SI/HI  Hospital Course:  Active Problems:   Drug overdose   ?Unintentional drug overdose Polysubstance abuse Stable, back to baseline S/P Narcan drip UDS positive for benzo, opiates, cocaine, THC CT head negative Psych consulted, recommended patient be discharged, does not meet inpatient psych admission Follow up with PCP, refuses outpt psych follow  up   Ischemic cardiomyopathy/HTN/Paroxysmal Afib/CAD/PAD EF 45% Continue home meds  Depression Continue home meds Refuses outpt psych follow up    Malnutrition Type:      Malnutrition Characteristics:      Nutrition Interventions:      Estimated body mass index is 24.91 kg/m as calculated from the following:   Height as of 02/24/20: 5\' 1"  (1.549 m).   Weight as of this encounter: 59.8 kg.    Procedures:  None  Consultations:  PCCM  Psychiatry   Discharge Exam: BP (!) 150/71 (BP Location: Left Arm)   Pulse (!) 56   Temp 97.9 F (36.6 C) (Oral)   Resp 17   Wt 59.8 kg   SpO2 100%   BMI 24.91 kg/m   General: NAD Cardiovascular: S1, S2 present  Respiratory: CTAB    Discharge Instructions You were cared for by a hospitalist during your hospital stay. If you have any questions about your discharge medications or the care you received while you were in the hospital after you are discharged, you can call the unit and asked to speak with the hospitalist on call if the hospitalist that took care of you is not available. Once you are discharged, your primary care physician will handle any further medical issues. Please note that NO REFILLS for any discharge medications will be authorized once you are discharged, as it is imperative that you return to your primary care physician (or establish a relationship with a primary care physician if you do not have one) for your aftercare needs so that they can reassess your need for  medications and monitor your lab values.  Discharge Instructions    Diet - low sodium heart healthy   Complete by: As directed    Increase activity slowly   Complete by: As directed      Allergies as of 04/11/2020      Reactions   Benadryl [diphenhydramine] Shortness Of Breath   Penicillins Diarrhea   Did it involve swelling of the face/tongue/throat, SOB, or low BP? No Did it involve sudden or severe rash/hives, skin peeling, or any  reaction on the inside of your mouth or nose? No Did you need to seek medical attention at a hospital or doctor's office? No When did it last happen?2 months If all above answers are "NO", may proceed with cephalosporin use.   Adhesive [tape] Other (See Comments)   Burning of skin   Amoxicillin Diarrhea, Other (See Comments)   Has patient had a PCN reaction causing immediate rash, facial/tongue/throat swelling, SOB or lightheadedness with hypotension: No Has patient had a PCN reaction causing severe rash involving mucus membranes or skin necrosis: No Has patient had a PCN reaction that required hospitalization No Has patient had a PCN reaction occurring within the last 10 years: Yes -- noted rxn as diarrhea If all of the above answers are "NO", then may proceed with Cephalosporin use.   Hydrocodone-acetaminophen Other (See Comments)   Cyclobenzaprine Other (See Comments)   Causes restless leg syndrome Restless syndrome   Hydrocodone Itching      Medication List    TAKE these medications   acetaminophen 500 MG tablet Commonly known as: TYLENOL Take 500-1,000 mg by mouth every 6 (six) hours as needed (for pain.).   albuterol 108 (90 Base) MCG/ACT inhaler Commonly known as: VENTOLIN HFA Inhale 2 puffs into the lungs every 6 (six) hours as needed for wheezing or shortness of breath.   albuterol (2.5 MG/3ML) 0.083% nebulizer solution Commonly known as: PROVENTIL Inhale 2.5 mg into the lungs 3 (three) times daily as needed for shortness of breath or wheezing.   ALPRAZolam 0.5 MG tablet Commonly known as: XANAX Take 0.5 mg by mouth 3 (three) times daily as needed for anxiety.   amiodarone 100 MG tablet Commonly known as: PACERONE Take 1 tablet (100 mg total) by mouth daily.   apixaban 5 MG Tabs tablet Commonly known as: Eliquis Take 1 tablet (5 mg total) by mouth 2 (two) times daily.   aspirin 81 MG chewable tablet Chew 1 tablet (81 mg total) by mouth daily.    atorvastatin 80 MG tablet Commonly known as: LIPITOR Take 1 tablet (80 mg total) by mouth daily.   buPROPion 150 MG 24 hr tablet Commonly known as: WELLBUTRIN XL Take 150 mg by mouth daily.   diclofenac Sodium 1 % Gel Commonly known as: VOLTAREN Apply 1 g topically 4 (four) times daily as needed for pain.   docusate sodium 100 MG capsule Commonly known as: COLACE Take 100 mg by mouth daily as needed for mild constipation.   ezetimibe 10 MG tablet Commonly known as: ZETIA Take 10 mg by mouth daily.   furosemide 20 MG tablet Commonly known as: LASIX Take 1 tablet (20 mg total) by mouth every other day. Please call and schedule an appointment for further refills 1st attempt What changed:   when to take this  reasons to take this  additional instructions   gabapentin 300 MG capsule Commonly known as: NEURONTIN Take 300 mg by mouth 3 (three) times daily.   melatonin 5 MG Tabs  Take 5 mg by mouth at bedtime as needed (sleep).   nitroGLYCERIN 0.4 MG SL tablet Commonly known as: NITROSTAT Place 1 tablet (0.4 mg total) under the tongue every 5 (five) minutes as needed for chest pain.   Oxycodone HCl 10 MG Tabs Take 1 tablet (10 mg total) by mouth 3 (three) times daily. What changed:   when to take this  reasons to take this   pantoprazole 40 MG tablet Commonly known as: PROTONIX TAKE ONE TABLET BY MOUTH EVERY MORNING What changed: when to take this   pramipexole 0.25 MG tablet Commonly known as: MIRAPEX Take 0.25 mg by mouth at bedtime.   promethazine 12.5 MG tablet Commonly known as: PHENERGAN TAKE ONE TABLET BY MOUTH EVERY 6 HOURS AS NEEDED FOR NAUSEA AND VOMITING What changed: See the new instructions.   ranolazine 1000 MG SR tablet Commonly known as: RANEXA Take 1,000 mg by mouth 2 (two) times daily.   sacubitril-valsartan 24-26 MG Commonly known as: ENTRESTO Take 1 tablet by mouth 2 (two) times daily.   sertraline 100 MG tablet Commonly known as:  ZOLOFT Take 150 mg by mouth at bedtime.      Allergies  Allergen Reactions  . Benadryl [Diphenhydramine] Shortness Of Breath  . Penicillins Diarrhea    Did it involve swelling of the face/tongue/throat, SOB, or low BP? No Did it involve sudden or severe rash/hives, skin peeling, or any reaction on the inside of your mouth or nose? No Did you need to seek medical attention at a hospital or doctor's office? No When did it last happen?2 months If all above answers are "NO", may proceed with cephalosporin use.  . Adhesive [Tape] Other (See Comments)    Burning of skin  . Amoxicillin Diarrhea and Other (See Comments)    Has patient had a PCN reaction causing immediate rash, facial/tongue/throat swelling, SOB or lightheadedness with hypotension: No Has patient had a PCN reaction causing severe rash involving mucus membranes or skin necrosis: No Has patient had a PCN reaction that required hospitalization No Has patient had a PCN reaction occurring within the last 10 years: Yes -- noted rxn as diarrhea If all of the above answers are "NO", then may proceed with Cephalosporin use.   Marland Kitchen Hydrocodone-Acetaminophen Other (See Comments)  . Cyclobenzaprine Other (See Comments)    Causes restless leg syndrome Restless syndrome  . Hydrocodone Itching    Follow-up Information    Josetta Huddle, MD. Schedule an appointment as soon as possible for a visit in 1 week(s).   Specialty: Internal Medicine Contact information: [address] [place] [address] [phone]        Nahser, Wonda Cheng, MD .   Specialty: Cardiology Contact information: [place] [address] [phone]                The results of significant diagnostics from this hospitalization (including imaging, microbiology, ancillary and laboratory) are listed below for reference.    Significant Diagnostic Studies: CT HEAD WO CONTRAST  Result Date:  04/10/2020 CLINICAL DATA:  Bradycardia and hypotension with unconsciousness. EXAM: CT HEAD WITHOUT CONTRAST TECHNIQUE: Contiguous axial images were obtained from the base of the skull through the vertex without intravenous contrast. COMPARISON:  09/19/2019 FINDINGS: Brain: No evidence of acute infarction, hemorrhage, hydrocephalus, extra-axial collection or mass lesion/mass effect. Dilated perivascular space below the left putamen. Vascular: No hyperdense vessel or unexpected calcification. Skull: Normal. Negative for fracture or focal lesion. Sinuses/Orbits: No  acute finding. IMPRESSION: Negative head CT. Electronically Signed   By: Monte Fantasia M.D.   On: 04/10/2020 05:38   DG Chest Port 1 View  Result Date: 04/10/2020 CLINICAL DATA:  Altered mental status EXAM: PORTABLE CHEST 1 VIEW COMPARISON:  09/20/2019 FINDINGS: Cardiac shadow is stable. Postsurgical changes are again seen and stable. Coronary stenting is noted. Lungs are well aerated without focal infiltrate or sizable effusion. No bony abnormality is noted. IMPRESSION: No acute abnormality seen. Electronically Signed   By: Inez Catalina M.D.   On: 04/10/2020 00:18    Microbiology: Recent Results (from the past 240 hour(s))  Urine culture     Status: Abnormal   Collection Time: 04/09/20  6:49 AM   Specimen: Urine, Random  Result Value Ref Range Status   Specimen Description URINE, RANDOM  Final   Special Requests NONE  Final   Culture (A)  Final    <10,000 COLONIES/mL INSIGNIFICANT GROWTH Performed at [facility], [address] [place], [place] [address]    Report Status 04/11/2020 FINAL  Final  SARS Coronavirus 2 by RT PCR (hospital order, performed in [facility] hospital lab) Nasopharyngeal Nasopharyngeal Swab     Status: None   Collection Time: 04/10/20  4:54 AM   Specimen: Nasopharyngeal Swab  Result Value Ref Range Status   SARS Coronavirus 2 NEGATIVE NEGATIVE Final    Comment: (NOTE) SARS-CoV-2 target nucleic acids  are NOT DETECTED.  The SARS-CoV-2 RNA is generally detectable in upper and lower respiratory specimens during the acute phase of infection. The lowest concentration of SARS-CoV-2 viral copies this assay can detect is 250 copies / mL. A negative result does not preclude SARS-CoV-2 infection and should not be used as the sole basis for treatment or other patient management decisions.  A negative result may occur with improper specimen collection / handling, submission of specimen other than nasopharyngeal swab, presence of viral mutation(s) within the areas targeted by this assay, and inadequate number of viral copies (<250 copies / mL). A negative result must be combined with clinical observations, patient history, and epidemiological information.  Fact Sheet for Patients:   StrictlyIdeas.no  Fact Sheet for Healthcare Providers: BankingDealers.co.za  This test is not yet approved or  cleared by the [place] FDA and has been authorized for detection and/or diagnosis of SARS-CoV-2 by FDA under an Emergency Use Authorization (EUA).  This EUA will remain in effect (meaning this test can be used) for the duration of the COVID-19 declaration under Section 564(b)(1) of the Act, 21 U.S.C. section 360bbb-3(b)(1), unless the authorization is terminated or revoked sooner.  Performed at [facility], [place] [address] [place], [place] [address]   MRSA PCR Screening     Status: Abnormal   Collection Time: 04/10/20  6:31 AM   Specimen: Nasopharyngeal  Result Value Ref Range Status   MRSA by PCR POSITIVE (A) NEGATIVE Corrected    Comment: RESULT CALLED TO, READ BACK BY AND VERIFIED WITH: RN K.[email or web address] AT 0845 ON 04/10/2020 BY [email or web address]        The GeneXpert MRSA Assay (FDA approved for NASAL specimens only), is one component of a comprehensive MRSA colonization surveillance program. It is not intended to diagnose MRSA infection nor to  guide or monitor treatment for MRSA infections. Performed at [facility], [place] [address], [place], [place] [address] CORRECTED ON 02/04 AT 0845: PREVIOUSLY REPORTED AS POSITIVE        The GeneXpert MRSA Assay (FDA approved  for NASAL specimens only), is one component of a comprehensive MRSA colonization surveillance program. It is not intended to diagnose MRSA infection  nor to guide or monitor treatment for MRSA infections.      Labs: Basic Metabolic Panel: Recent Labs  Lab 04/10/20 0023 04/10/20 0138 04/10/20 0543  NA 140 140 140  K 4.4 4.4 3.7  CL  --  106 111  CO2  --  26 22  GLUCOSE  --  82 95  BUN  --  14 12  CREATININE  --  0.98 0.85  CALCIUM  --  8.6* 7.8*  MG  --   --  1.5*  PHOS  --   --  3.7   Liver Function Tests: Recent Labs  Lab 04/10/20 0138  AST 16  ALT 14  ALKPHOS 57  BILITOT 0.4  PROT 6.2*  ALBUMIN 3.0*   No results for input(s): LIPASE, AMYLASE in the last 168 hours. No results for input(s): AMMONIA in the last 168 hours. CBC: Recent Labs  Lab 04/09/20 2354 04/10/20 0023 04/10/20 0543  WBC 4.0  --  4.5  NEUTROABS 2.6  --   --   HGB 10.2* 10.9* 10.2*  HCT 34.6* 32.0* 33.3*  MCV 95.6  --  94.6  PLT 137*  --  132*   Cardiac Enzymes: No results for input(s): CKTOTAL, CKMB, CKMBINDEX, TROPONINI in the last 168 hours. BNP: BNP (last 3 results) Recent Labs    06/19/19 1239 09/10/19 1305 09/19/19 1520  BNP 366.7* 2,476.7* 200.9*    ProBNP (last 3 results) No results for input(s): PROBNP in the last 8760 hours.  CBG: Recent Labs  Lab 04/10/20 0621  GLUCAP 89       Signed:  Alma Friendly, MD [facility] 04/11/2020, 4:17 PM

## 2020-04-11 NOTE — Consult Note (Signed)
[facility] Face-to-Face Psychiatry Consult   Reason for Consult:  "Pt stable from a medicine standpoint, could you follow up as recommended, thanks" Referring Physician:  Alma Friendly, MD Patient Identification: Wendy [name] MRN:  [id number] Principal Diagnosis: <principal problem not specified> Diagnosis:  Active Problems:   Drug overdose   Total Time spent with patient: 20 minutes  Subjective:   Wendy [name] is a 70 y.o. female patient admitted with opioid overdose requiring Narcan. Patient seen by psychiatry service yesterday but recommended a consult prior to discharge once medically cleared. Patient states that she did not purposely overdose and that it was accidental. She describes previously receiving medications in blister packs which made it easy to take medications but no longer does. Discussed diving medications in pill box to help with compliance and avoid accidental overdoses in the future. Pt verbalizes understanding and states, "I don't want to do this again" [referring to hospitaliztion]. She states that she lives with 2 nephews, one who is in his 29s and another who is 7. She continues to adamantly deny that this was intentional and denies intent or plan. She denies HI/AVH. Discussed possibility of resources for therapist but patient denies desire for therapy at this time. She provides consent for me to call her sister [name] as below. She states that [name] has visited her while in the hospital.    (sister) [name]  [phone] Called at 3:14 PM without answer Called at 3:41 PM  States that she does not think this was a suicide attempt, believes that it was an accident. She has never had a suicide attempt in the past. No safety concerns about patient being discharged. Requests call from medical team for update.     HPI:  70 year old with asthma, COPD, CHF, OSA found unconscious on the couch with hypotension, shallow respiration.  Given Narcan IV x3 in the field in ED  with temporary improvement.  She eventually started on Narcan drip with improvement in blood pressure, O2 saturation and mental status.  PCCM consulted for admission.  She has prescriptions for pain medication but no history of abuse or suicidal ideation noted.  Past Psychiatric History: Depression, taking medications as directed. Denies any previous outpatient providers, inpatient admission or therapy. She denies any previous suicide attempts, intentions, gestures or self harm.   Risk to Self:  Denies Risk to Others:   Denies Prior Inpatient Therapy:   Denies Prior Outpatient Therapy:   Denies  Past Medical History:  Past Medical History:  Diagnosis Date  . Anemia   . Anginal pain ([place])    jaw pain 07/2016, s/p 07/15/16 nuclear stress test  . Anxiety   . Arthritis    "lower back" (08/24/2017)  . Asthma   . [place] Obstructive Pulmonary Disease   . Chronic back pain   . Chronic combined systolic and diastolic CHF    EF 7/09: 62-83 // EF 4/21: 55-60 // EF 4/21: 20-25  . Chronic leg pain   . Chronic lower back pain   . Coronary Artery Disease    a. s/p NSTEMI >> CABG 1998 // s/p PCI 2013 // [place] 01/2015: DES to SVG-RCA.// Cath 05/2019: all grafts occluded; mod non-obs LAD disease w L-L and L-R collats - med Rx // post Ao-Bifem bypass MI c/b PEA, CGS requiring Impella >> anatomy unchanged - Med Rx  . Depression   . Fall 08/2016  . Gastroesophageal reflux disease   . Glucose intolerance   . Headache    "a few/month" (  08/24/2017)  . HLD (hyperlipidemia)   . Hx of DVT X 3   RLE  . Hypertension   . Myocardial infarction ([place]) 1999  . OSA on CPAP   . Paroxysmal atrial fibrillation    a. 09/2013 post-op b. 01/2015 // Apixaban // recurrent AFib post MI after Ao-Bifem in 06/2019 >> Amio Rx  . Peripheral artery disease    s/p L fem-fem bypass // s/p Ao-Bifem bypass in 06/2019  . Peripheral neuropathy   . Pneumonia   . Shortness of breath    when walking  . Wears glasses     Past  Surgical History:  Procedure Laterality Date  . ABDOMINAL AORTOGRAM W/LOWER EXTREMITY Bilateral 04/29/2019   Procedure: ABDOMINAL AORTOGRAM W/LOWER EXTREMITY;  Surgeon: Waynetta Sandy, MD;  Location: [place] [facility];  Service: Cardiovascular;  Laterality: Bilateral;  . ANTERIOR CERVICAL DECOMP/DISCECTOMY FUSION  1991  . AORTA - BILATERAL FEMORAL ARTERY BYPASS GRAFT N/A 06/18/2019   Procedure: AORTA BIFEMORAL BYPASS GRAFT;  Surgeon: Waynetta Sandy, MD;  Location: [place];  Service: Vascular;  Laterality: N/A;  . ARTERY REPAIR Left 06/30/2019   Procedure: LEFT BRACHIAL ARTERY REPAIR, EVACUATION OF HEMATOMA;  Surgeon: Serafina Mitchell, MD;  Location: [facility];  Service: Vascular;  Laterality: Left;  . BACK SURGERY    . CARDIAC CATHETERIZATION N/A 01/22/2015   Procedure: Left Heart Cath and Coronary Angiography;  Surgeon: Troy Sine, MD;  Location: [place] [facility];  Service: Cardiovascular;  Laterality: N/A;  . CARDIAC CATHETERIZATION N/A 01/22/2015   Procedure: Coronary Stent Intervention;  Surgeon: Troy Sine, MD;  Location: [facility];  Service: Cardiovascular;  Laterality: N/A;  3.0x12 xience prox SVG to RCA  . CARDIAC CATHETERIZATION  03/09/2000   hx/notes 03/20/2000  . CHOLECYSTECTOMY N/A 09/16/2013   Procedure: LAPAROSCOPIC CHOLECYSTECTOMY CONVERTED TO OPEN CHOLECYSTECTOMY WITH CHOLANGIOGRAM;  Surgeon: [name], MD;  Location: [facility];  Service: General;  Laterality: N/A;  . CORONARY ANGIOGRAPHY N/A 06/27/2019   Procedure: CORONARY ANGIOGRAPHY (CATH LAB);  Surgeon: Sherren Mocha, MD;  Location: [facility];  Service: Cardiovascular;  Laterality: N/A;  . CORONARY ANGIOPLASTY WITH STENT PLACEMENT  11/2011, 02/2012, 01/2015   DES to SVG-RCA both times, In [place], [place], Dr. Bruce Donath  . CORONARY ARTERY BYPASS GRAFT  1998   LIMA-LAD, SVG-OM, SVG-RCA  . ERCP N/A 09/19/2013   Procedure: ENDOSCOPIC RETROGRADE CHOLANGIOPANCREATOGRAPHY (ERCP);   Surgeon: [name], MD;  Location: [facility];  Service: Endoscopy;  Laterality: N/A;  . FINGER SURGERY Right    ring finger "fused"  . FRACTURE SURGERY    . IRRIGATION AND DEBRIDEMENT ABSCESS Right 10/17/2013   Procedure: INCISION AND DRAINAGE RIGHT SUBCOSTAL WOUND;  Surgeon: [name], MD;  Location: [facility];  Service: General;  Laterality: Right;  . JOINT REPLACEMENT    . KNEE ARTHROSCOPY Right 1998  . LEFT HEART CATH AND CORS/GRAFTS ANGIOGRAPHY N/A 07/25/2016   Procedure: Left Heart Cath and Cors/Grafts Angiography;  Surgeon: Jettie Booze, MD;  Location: [facility];  Service: Cardiovascular;  Laterality: N/A;  . LEFT HEART CATH AND CORS/GRAFTS ANGIOGRAPHY N/A 06/05/2019   Procedure: LEFT HEART CATH AND CORS/GRAFTS ANGIOGRAPHY;  Surgeon: Burnell Blanks, MD;  Location: [facility];  Service: Cardiovascular;  Laterality: N/A;  . LEFT HEART CATHETERIZATION WITH CORONARY/GRAFT ANGIOGRAM N/A 07/19/2013   Procedure: LEFT HEART CATHETERIZATION WITH Beatrix Fetters;  Surgeon: Leonie Man, MD;  Location: [facility];  Service:  Cardiovascular;  Laterality: N/A;  . MASS EXCISION Right 06/25/2014   Procedure: EXCISION BUTTOCK MASS ;  Surgeon: Coralie Keens, MD;  Location: [place];  Service: General;  Laterality: Right;  . ORIF HUMERUS FRACTURE Left 08/27/2013   Procedure: OPEN REDUCTION INTERNAL FIXATION (ORIF) LEFT HUMERUS ;  Surgeon: Rozanna Box, MD;  Location: [place];  Service: Orthopedics;  Laterality: Left;  . PLACEMENT OF IMPELLA LEFT VENTRICULAR ASSIST DEVICE N/A 06/24/2019   Procedure: PLACEMENT OF IMPELLA LEFT VENTRICULAR ASSIST DEVICE;  Surgeon: Wonda Olds, MD;  Location: [facility];  Service: Open Heart Surgery;  Laterality: N/A;  . [place]  . REMOVAL OF IMPELLA LEFT VENTRICULAR ASSIST DEVICE Right 06/28/2019   Procedure: REMOVAL OF IMPELLA LEFT VENTRICULAR ASSIST DEVICE;  Surgeon: Wonda Olds, MD;  Location: [facility];  Service: Open Heart Surgery;  Laterality: Right;  . THROMBECTOMY / EMBOLECTOMY FEMORAL ARTERY Right    hx/notes 03/20/2000  . TOTAL HIP ARTHROPLASTY Right 07/19/2016   Procedure: TOTAL HIP ARTHROPLASTY ANTERIOR APPROACH;  Surgeon: [name], MD;  Location: [facility];  Service: Orthopedics;  Laterality: Right;  . TOTAL HIP REVISION Right 08/22/2017   Procedure: TOTAL HIP REVISION;  Surgeon: [name], MD;  Location: [facility];  Service: Orthopedics;  Laterality: Right;   Family History:  Family History  Problem Relation Age of Onset  . Lung cancer Mother   . Clotting disorder Father   . Lung cancer Sister   . Heart disease Brother   . Heart disease Brother   . Colon cancer Neg Hx   . Breast cancer Neg Hx    Family Psychiatric  History: She denies Social History:  Social History   Substance and Sexual Activity  Alcohol Use Not Currently     Social History   Substance and Sexual Activity  Drug Use Yes  . Types: Marijuana    Social History   Socioeconomic History  . Marital status: Divorced    Spouse name: Not on file  . Number of children: 5  . Years of education: Not on file  . Highest education level: Not on file  Occupational History  . Occupation: Disabled  Tobacco Use  . Smoking status: Former Smoker    Packs/day: 0.50    Years: 34.00    Pack years: 17.00    Types: Cigarettes    Quit date: 07/20/1998    Years since quitting: 21.7  . Smokeless tobacco: Never Used  Vaping Use  . Vaping Use: Never used  Substance and Sexual Activity  . Alcohol use: Not Currently  . Drug use: Yes    Types: Marijuana  . Sexual activity: Not Currently  Other Topics Concern  . Not on file  Social History Narrative   Lives with best friend.    Social Determinants of Health   Financial Resource Strain: Not on file  Food Insecurity: Not on file  Transportation Needs: Not on file  Physical Activity: Not on file  Stress: Not on file   Social Connections: Not on file   Additional Social History:    Allergies:   Allergies  Allergen Reactions  . Benadryl [Diphenhydramine] Shortness Of Breath  . Penicillins Diarrhea    Did it involve swelling of the face/tongue/throat, SOB, or low BP? No Did it involve sudden or severe rash/hives, skin peeling, or any reaction on the inside of your mouth or nose? No Did you need to seek medical attention at a  hospital or doctor's office? No When did it last happen?2 months If all above answers are "NO", may proceed with cephalosporin use.  . Adhesive [Tape] Other (See Comments)    Burning of skin  . Amoxicillin Diarrhea and Other (See Comments)    Has patient had a PCN reaction causing immediate rash, facial/tongue/throat swelling, SOB or lightheadedness with hypotension: No Has patient had a PCN reaction causing severe rash involving mucus membranes or skin necrosis: No Has patient had a PCN reaction that required hospitalization No Has patient had a PCN reaction occurring within the last 10 years: Yes -- noted rxn as diarrhea If all of the above answers are "NO", then may proceed with Cephalosporin use.   Marland Kitchen Hydrocodone-Acetaminophen Other (See Comments)  . Cyclobenzaprine Other (See Comments)    Causes restless leg syndrome Restless syndrome  . Hydrocodone Itching    Labs:  Results for orders placed or performed during the hospital encounter of 04/09/20 (from the past 48 hour(s))  Urinalysis, Routine w reflex microscopic     Status: None   Collection Time: 04/09/20 11:54 PM  Result Value Ref Range   Color, Urine YELLOW YELLOW   APPearance CLEAR CLEAR   Specific Gravity, Urine 1.009 1.005 - 1.030   pH 5.0 5.0 - 8.0   Glucose, UA NEGATIVE NEGATIVE mg/dL   Hgb urine dipstick NEGATIVE NEGATIVE   Bilirubin Urine NEGATIVE NEGATIVE   Ketones, ur NEGATIVE NEGATIVE mg/dL   Protein, ur NEGATIVE NEGATIVE mg/dL   Nitrite NEGATIVE NEGATIVE   Leukocytes,Ua NEGATIVE NEGATIVE     Comment: Performed at [facility] [address] [place], [place] [address]  CBC with Differential     Status: Abnormal   Collection Time: 04/09/20 11:54 PM  Result Value Ref Range   WBC 4.0 4.0 - 10.5 K/uL   RBC 3.62 (L) 3.87 - 5.11 MIL/uL   Hemoglobin 10.2 (L) 12.0 - 15.0 g/dL   HCT 34.6 (L) 36.0 - 46.0 %   MCV 95.6 80.0 - 100.0 fL   MCH 28.2 26.0 - 34.0 pg   MCHC 29.5 (L) 30.0 - 36.0 g/dL   RDW 14.6 11.5 - 15.5 %   Platelets 137 (L) 150 - 400 K/uL   nRBC 0.0 0.0 - 0.2 %   Neutrophils Relative % 65 %   Neutro Abs 2.6 1.7 - 7.7 K/uL   Lymphocytes Relative 23 %   Lymphs Abs 0.9 0.7 - 4.0 K/uL   Monocytes Relative 8 %   Monocytes Absolute 0.3 0.1 - 1.0 K/uL   Eosinophils Relative 2 %   Eosinophils Absolute 0.1 0.0 - 0.5 K/uL   Basophils Relative 1 %   Basophils Absolute 0.0 0.0 - 0.1 K/uL   Immature Granulocytes 1 %   Abs Immature Granulocytes 0.02 0.00 - 0.07 K/uL    Comment: Performed at [facility] [address], [place], [place] [address]  Rapid urine drug screen (hospital performed)     Status: Abnormal   Collection Time: 04/09/20 11:55 PM  Result Value Ref Range   Opiates POSITIVE (A) NONE DETECTED   Cocaine POSITIVE (A) NONE DETECTED   Benzodiazepines POSITIVE (A) NONE DETECTED   Amphetamines NONE DETECTED NONE DETECTED   Tetrahydrocannabinol POSITIVE (A) NONE DETECTED   Barbiturates NONE DETECTED NONE DETECTED    Comment: (NOTE) DRUG SCREEN FOR MEDICAL PURPOSES ONLY.  IF CONFIRMATION IS NEEDED FOR ANY PURPOSE, NOTIFY LAB WITHIN 5 DAYS.  LOWEST DETECTABLE LIMITS FOR URINE DRUG SCREEN Drug Class  Cutoff (ng/mL) Amphetamine and metabolites    1000 Barbiturate and metabolites    200 Benzodiazepine                 200 Tricyclics and metabolites     300 Opiates and metabolites        300 Cocaine and metabolites        300 THC                            50 Performed at [facility], [address] [place],  [place] [address]   Ethanol     Status: None   Collection Time: 04/09/20 11:55 PM  Result Value Ref Range   Alcohol, Ethyl (B) <10 <10 mg/dL    Comment: (NOTE) Lowest detectable limit for serum alcohol is 10 mg/dL.  For medical purposes only. Performed at [facility], [address] [place], [place] [address]   I-Stat venous blood gas, [facility] ED)     Status: Abnormal   Collection Time: 04/10/20 12:23 AM  Result Value Ref Range   pH, Ven 7.243 (L) 7.250 - 7.430   pCO2, Ven 63.8 (H) 44.0 - 60.0 mmHg   pO2, Ven 158.0 (H) 32.0 - 45.0 mmHg   Bicarbonate 27.5 20.0 - 28.0 mmol/L   TCO2 29 22 - 32 mmol/L   O2 Saturation 99.0 %   Acid-base deficit 1.0 0.0 - 2.0 mmol/L   Sodium 140 135 - 145 mmol/L   Potassium 4.4 3.5 - 5.1 mmol/L   Calcium, Ion 1.18 1.15 - 1.40 mmol/L   HCT 32.0 (L) 36.0 - 46.0 %   Hemoglobin 10.9 (L) 12.0 - 15.0 g/dL   Sample type VENOUS   Comprehensive metabolic panel     Status: Abnormal   Collection Time: 04/10/20  1:38 AM  Result Value Ref Range   Sodium 140 135 - 145 mmol/L   Potassium 4.4 3.5 - 5.1 mmol/L   Chloride 106 98 - 111 mmol/L   CO2 26 22 - 32 mmol/L   Glucose, Bld 82 70 - 99 mg/dL    Comment: Glucose reference range applies only to samples taken after fasting for at least 8 hours.   BUN 14 8 - 23 mg/dL   Creatinine, Ser 8.290.98 0.44 - 1.00 mg/dL   Calcium 8.6 (L) 8.9 - 10.3 mg/dL   Total Protein 6.2 (L) 6.5 - 8.1 g/dL   Albumin 3.0 (L) 3.5 - 5.0 g/dL   AST 16 15 - 41 U/L   ALT 14 0 - 44 U/L   Alkaline Phosphatase 57 38 - 126 U/L   Total Bilirubin 0.4 0.3 - 1.2 mg/dL   GFR, Estimated >56>60 >21>60 mL/min    Comment: (NOTE) Calculated using the CKD-EPI Creatinine Equation (2021)    Anion gap 8 5 - 15    Comment: Performed at [facility], [address] [place], [place] [address]  SARS Coronavirus 2 by RT PCR (hospital order, performed in [facility] hospital lab) Nasopharyngeal Nasopharyngeal Swab     Status: None   Collection Time: 04/10/20   4:54 AM   Specimen: Nasopharyngeal Swab  Result Value Ref Range   SARS Coronavirus 2 NEGATIVE NEGATIVE    Comment: (NOTE) SARS-CoV-2 target nucleic acids are NOT DETECTED.  The SARS-CoV-2 RNA is generally detectable in upper and lower respiratory specimens during the acute phase of infection. The lowest concentration of SARS-CoV-2 viral copies this assay  can detect is 250 copies / mL. A negative result does not preclude SARS-CoV-2 infection and should not be used as the sole basis for treatment or other patient management decisions.  A negative result may occur with improper specimen collection / handling, submission of specimen other than nasopharyngeal swab, presence of viral mutation(s) within the areas targeted by this assay, and inadequate number of viral copies (<250 copies / mL). A negative result must be combined with clinical observations, patient history, and epidemiological information.  Fact Sheet for Patients:   BoilerBrush.com.cy  Fact Sheet for Healthcare Providers: https://pope.com/  This test is not yet approved or  cleared by the [place] FDA and has been authorized for detection and/or diagnosis of SARS-CoV-2 by FDA under an Emergency Use Authorization (EUA).  This EUA will remain in effect (meaning this test can be used) for the duration of the COVID-19 declaration under Section 564(b)(1) of the Act, 21 U.S.C. section 360bbb-3(b)(1), unless the authorization is terminated or revoked sooner.  Performed at [facility], [address], [place], [place] [address]   CBC     Status: Abnormal   Collection Time: 04/10/20  5:43 AM  Result Value Ref Range   WBC 4.5 4.0 - 10.5 K/uL   RBC 3.52 (L) 3.87 - 5.11 MIL/uL   Hemoglobin 10.2 (L) 12.0 - 15.0 g/dL   HCT 12.2 (L) 48.2 - 50.0 %   MCV 94.6 80.0 - 100.0 fL   MCH 29.0 26.0 - 34.0 pg   MCHC 30.6 30.0 - 36.0 g/dL   RDW 37.0 48.8 - 89.1 %   Platelets 132  (L) 150 - 400 K/uL   nRBC 0.0 0.0 - 0.2 %    Comment: Performed at [facility], [address], [place], [place] [address]  Basic metabolic panel     Status: Abnormal   Collection Time: 04/10/20  5:43 AM  Result Value Ref Range   Sodium 140 135 - 145 mmol/L   Potassium 3.7 3.5 - 5.1 mmol/L   Chloride 111 98 - 111 mmol/L   CO2 22 22 - 32 mmol/L   Glucose, Bld 95 70 - 99 mg/dL    Comment: Glucose reference range applies only to samples taken after fasting for at least 8 hours.   BUN 12 8 - 23 mg/dL   Creatinine, Ser 3.88 0.44 - 1.00 mg/dL   Calcium 7.8 (L) 8.9 - 10.3 mg/dL   GFR, Estimated >82 >80 mL/min    Comment: (NOTE) Calculated using the CKD-EPI Creatinine Equation (2021)    Anion gap 7 5 - 15    Comment: Performed at [facility], [address] [place], [place] [address]  Magnesium     Status: Abnormal   Collection Time: 04/10/20  5:43 AM  Result Value Ref Range   Magnesium 1.5 (L) 1.7 - 2.4 mg/dL    Comment: Performed at [facility], [address] [place], [place] [address]  Phosphorus     Status: None   Collection Time: 04/10/20  5:43 AM  Result Value Ref Range   Phosphorus 3.7 2.5 - 4.6 mg/dL    Comment: Performed at [facility], [address], [place], [place] [address]  Glucose, capillary     Status: None   Collection Time: 04/10/20  6:21 AM  Result Value Ref Range   Glucose-Capillary 89 70 - 99 mg/dL    Comment: Glucose reference range applies only to samples taken after fasting  for at least 8 hours.  MRSA PCR Screening     Status: Abnormal   Collection Time: 04/10/20  6:31 AM   Specimen: Nasopharyngeal  Result Value Ref Range   MRSA by PCR POSITIVE (A) NEGATIVE    Comment: RESULT CALLED TO, READ BACK BY AND VERIFIED WITH: RN K.PHILIP AT 0845 ON 04/10/2020 BY T.[email or web address]        The GeneXpert MRSA Assay (FDA approved for NASAL specimens only), is one component of a comprehensive MRSA colonization surveillance program. It is  not intended to diagnose MRSA infection nor to guide or monitor treatment for MRSA infections. Performed at [facility], [place] [address], [place], [place] [address] CORRECTED ON 02/04 AT 0845: PREVIOUSLY REPORTED AS POSITIVE        The GeneXpert MRSA Assay (FDA approved for NASAL specimens only), is one component of a comprehensive MRSA colonization surveillance program. It is not intended to diagnose MRSA infection  nor to guide or monitor treatment for MRSA infections.     Current Facility-Administered Medications  Medication Dose Route Frequency Provider Last Rate Last Admin  . 0.9 %  sodium chloride infusion  1,000 mL Intravenous Continuous Cardama, Grayce Sessions, MD   Stopped at 04/10/20 0256  . albuterol (PROVENTIL) (2.5 MG/3ML) 0.083% nebulizer solution 2.5 mg  2.5 mg Nebulization Q6H PRN Simonne Maffucci B, MD      . ALPRAZolam Duanne Moron) tablet 0.5 mg  0.5 mg Oral Q8H PRN Simonne Maffucci B, MD   0.5 mg at 04/11/20 1501  . amiodarone (PACERONE) tablet 100 mg  100 mg Oral Daily Mannam, Praveen, MD   100 mg at 04/11/20 1029  . apixaban (ELIQUIS) tablet 5 mg  5 mg Oral BID Mannam, Praveen, MD   5 mg at 04/11/20 1030  . aspirin chewable tablet 81 mg  81 mg Oral Daily Mannam, Praveen, MD   81 mg at 04/11/20 1030  . atorvastatin (LIPITOR) tablet 80 mg  80 mg Oral Daily Mannam, Praveen, MD   80 mg at 04/11/20 1030  . Chlorhexidine Gluconate Cloth 2 % PADS 6 each  6 each Topical Daily Marshell Garfinkel, MD   6 each at 04/10/20 [phone]  . docusate sodium (COLACE) capsule 100 mg  100 mg Oral BID PRN Mannam, Praveen, MD      . ezetimibe (ZETIA) tablet 10 mg  10 mg Oral Daily Mannam, Praveen, MD   10 mg at 04/11/20 1029  . MEDLINE mouth rinse  15 mL Mouth Rinse BID Simonne Maffucci B, MD   15 mL at 04/10/20 2146  . mupirocin ointment (BACTROBAN) 2 % 1 application  1 application Nasal BID Juanito Doom, MD   1 application at [id number] [phone]  . polyethylene glycol (MIRALAX / GLYCOLAX) packet 17  g  17 g Oral Daily PRN Mannam, Praveen, MD      . ranolazine (RANEXA) 12 hr tablet 1,000 mg  1,000 mg Oral BID Mannam, Praveen, MD   1,000 mg at 04/11/20 1029  . sacubitril-valsartan (ENTRESTO) 24-26 mg per tablet  1 tablet Oral BID Mannam, Praveen, MD   1 tablet at 04/11/20 1030    Musculoskeletal: Strength & Muscle Tone: within normal limits Gait & Station: normal Patient leans: N/A  Psychiatric Specialty Exam: Physical Exam Constitutional:      Appearance: Normal appearance. She is normal weight.  HENT:     Head: Normocephalic and atraumatic.  Eyes:     Extraocular Movements: Extraocular movements intact.  Pulmonary:  Effort: Pulmonary effort is normal.  Neurological:     Mental Status: She is alert.     Review of Systems  Psychiatric/Behavioral: Negative for dysphoric mood, hallucinations and suicidal ideas. The patient is not nervous/anxious.     Blood pressure (!) 150/71, pulse (!) 56, temperature 97.9 F (36.6 C), temperature source Oral, resp. rate 17, weight 59.8 kg, SpO2 100 %.Body mass index is 24.91 kg/m.  General Appearance: Fairly Groomed  Eye Contact:  Fair  Speech:  Clear and Coherent and Normal Rate  Volume:  Normal  Mood:  Euthymic  Affect:  Appropriate and Congruent  Thought Process:  Coherent, Linear and Descriptions of Associations: Intact  Orientation:  Full (Time, Place, and Person)  Thought Content:  Logical  Suicidal Thoughts:  No  Homicidal Thoughts:  No  Memory:  Immediate;   Fair Recent;   Fair  Judgement:  Fair  Insight:  Fair  Psychomotor Activity:  Normal  Concentration:  Concentration: Fair and Attention Span: Fair  Recall:  [employer] of Knowledge:  Fair  Language:  Fair  Akathisia:  No  Handed:  Right  AIMS (if indicated):     Assets:  Communication Skills Desire for Improvement Financial Resources/Insurance Housing Physical Health Resilience Social Support Vocational/Educational  ADL's:  Intact  Cognition:  WNL   Sleep:        Treatment Plan Summary: Plan  Resume home medications at this time. Recommend workign closely with SW to facilitate outpatient psychiatric services for behavioral health to include therapy and substance abuse treatment if amenable, did not express interest on my examin  Recommend for patient to follow up with regular outpatient provider, Dr. Josetta Huddle  Pt denies that this was a suicide attempt. Spoke to sister with consent who also denies that this was a suicide attempt and has no safety concerns about patient discharging  Disposition:  Does not meet criteria for inpatient psychiatric admission. Per primary team  Recommendations communicated to primary team over secure chat. Psychiatry will sign off  Ival Bible, MD 04/11/2020 3:08 PM

## 2020-04-11 NOTE — Progress Notes (Signed)
[name] be D/C'd per MD order. Discussed with the patient and all questions fully answered. ? VSS, Skin clean, dry and intact without evidence of skin break down, no evidence of skin tears noted. ? IV catheter discontinued intact. Site without signs and symptoms of complications. Dressing and pressure applied. ? An After Visit Summary was printed and given to the patient. Patient informed where to pickup prescriptions. ? D/c education completed with patient/family including follow up instructions, medication list, d/c activities limitations if indicated, with other d/c instructions as indicated by MD - patient able to verbalize understanding, all questions fully answered.  ? Patient instructed to return to ED, call 911, or call MD for any changes in condition.  ? Patient to be escorted via [place], and D/C home via private auto.

## 2020-04-15 ENCOUNTER — Ambulatory Visit
Admission: RE | Admit: 2020-04-15 | Discharge: 2020-04-15 | Disposition: A | Discharge status: Home / Self Care | Payer: Medicare HMO | Source: Ambulatory Visit | Attending: Internal Medicine | Admitting: Internal Medicine

## 2020-04-15 ENCOUNTER — Other Ambulatory Visit (HOSPITAL_COMMUNITY)
Admission: RE | Admit: 2020-04-15 | Discharge: 2020-04-15 | Disposition: A | Discharge status: Home / Self Care | Payer: Medicare HMO | Source: Ambulatory Visit | Attending: Internal Medicine | Admitting: Internal Medicine

## 2020-04-15 DIAGNOSIS — E049 Nontoxic goiter, unspecified: Secondary | ICD-10-CM | POA: Insufficient documentation

## 2020-04-15 DIAGNOSIS — E041 Nontoxic single thyroid nodule: Secondary | ICD-10-CM | POA: Diagnosis not present

## 2020-04-15 DIAGNOSIS — R896 Abnormal cytological findings in specimens from other organs, systems and tissues: Secondary | ICD-10-CM | POA: Diagnosis not present

## 2020-04-16 LAB — CYTOLOGY - NON PAP

## 2020-04-22 DIAGNOSIS — E049 Nontoxic goiter, unspecified: Secondary | ICD-10-CM | POA: Diagnosis not present

## 2020-05-04 DIAGNOSIS — I509 Heart failure, unspecified: Secondary | ICD-10-CM | POA: Diagnosis not present

## 2020-05-04 DIAGNOSIS — J441 Chronic obstructive pulmonary disease with (acute) exacerbation: Secondary | ICD-10-CM | POA: Diagnosis not present

## 2020-05-04 DIAGNOSIS — E785 Hyperlipidemia, unspecified: Secondary | ICD-10-CM | POA: Diagnosis not present

## 2020-05-04 DIAGNOSIS — I480 Paroxysmal atrial fibrillation: Secondary | ICD-10-CM | POA: Diagnosis not present

## 2020-05-04 DIAGNOSIS — J449 Chronic obstructive pulmonary disease, unspecified: Secondary | ICD-10-CM | POA: Diagnosis not present

## 2020-05-04 DIAGNOSIS — F322 Major depressive disorder, single episode, severe without psychotic features: Secondary | ICD-10-CM | POA: Diagnosis not present

## 2020-05-04 DIAGNOSIS — I25810 Atherosclerosis of coronary artery bypass graft(s) without angina pectoris: Secondary | ICD-10-CM | POA: Diagnosis not present

## 2020-05-04 DIAGNOSIS — I208 Other forms of angina pectoris: Secondary | ICD-10-CM | POA: Diagnosis not present

## 2020-05-04 DIAGNOSIS — I10 Essential (primary) hypertension: Secondary | ICD-10-CM | POA: Diagnosis not present

## 2020-05-06 ENCOUNTER — Encounter (HOSPITAL_COMMUNITY): Payer: Self-pay

## 2020-05-28 DIAGNOSIS — G894 Chronic pain syndrome: Secondary | ICD-10-CM | POA: Diagnosis not present

## 2020-05-28 DIAGNOSIS — I10 Essential (primary) hypertension: Secondary | ICD-10-CM | POA: Diagnosis not present

## 2020-05-28 DIAGNOSIS — M549 Dorsalgia, unspecified: Secondary | ICD-10-CM | POA: Diagnosis not present

## 2020-05-28 DIAGNOSIS — R030 Elevated blood-pressure reading, without diagnosis of hypertension: Secondary | ICD-10-CM | POA: Diagnosis not present

## 2020-05-31 ENCOUNTER — Encounter: Payer: Self-pay | Admitting: Cardiovascular Disease

## 2020-05-31 NOTE — Progress Notes (Deleted)
Cardiology Office Note   Date:  05/31/2020   ID:  ELANIA CROWL, DOB [birth year], MRN [id number]  PCP:  Josetta Huddle, MD  Cardiologist:   [name], MD   No chief complaint on file.  Problem List 1. Unstable angina (HCC) - history of CAD s/p CABG SVG-OM, LIMA-LAD, SVG-RCA in 1998, PCI in 2013. She underwent cardiac catheterization here in 07/2013 which showed an atretic LIMA-LAD with no significant disease in the native LAD, patent SVG-OM with patent stent and patent SVG-distRCA.  S/p PCI of native RCA ( within the stent )   2. Paroxysmal Atrial Fibrillation  CHADS2VASC = 4   ( female, age 53, CAD,HTN ) 3. Hypertension -    4. HLD (hyperlipidemia)  5. COPD (chronic obstructive pulmonary disease) ([place])  Dec. 2, 2016:  RAKIYA KRAWCZYK is a 70 y.o. female who presents for follow up of recent hospitalization for chest pain, abnormal blood pressure and PCI of the right coronary artery. Since the discharge she's not been feeling well.   After the stent, she felt very well for a week or so . Then she started Eliquis after she got home. Has felt poorly since that  No bleeding in stool. Thinks the Eliquis is causing her headaches, diarrhea, feels poorly all over.   Symptoms sound like flu like to me .  Wants to sleep all the time  Has not been to the medical doctor .  Has been very vatigued - complete lack of energy for the past week.   No CP , breathing is ok  No focal neurological changes.   August 13, 2015:  Temima is in atrial fib today .   Is having some generalized weakness and dyspnea.  Feels poorly all the time.   Is anemic,  Has been taking all of her meds But did not take them this am  Does not necessarily feel worse when she is in atrial fib   .she feesl poorly all the time ! Is going for colonoscopy soon   02/11/2016:  Is in atrial fib today  Just started today .   Has not seen the A-Fib clinic  needs to have hip replacement  surgery and back  injections or surgery   Oct. 1, 2018  Rodneshia is seen today for follow up visit Had a recent episode of CP , resolved with 1 SL NTG .  Was admitted in June with NSTEMI - + for cocaine at that time  Still has occasional episodes of CP, takes NTG wit relief. Exercising  1-2 times a week,  Is on Ranexa 1000 PO BID and Imdur 60 mg a day  Smokes marijuana  Frequently  Avoids salty foods for the most part.   July 03, 2017:  Amsi was last seen I 01-22-17  Her daughter died a month ago of respiratory complications - age 6.  Occasional episodes of CP ,  Relieved  Is not smoking  Takes NTG regularly  - perhaps 2-3 times a day , sometimes none .  She is having lots of back problems.  Her surgeon has suggested may be a back stimulator.  And wanted by input on whether or not she would be a candidate for this.  I think that she would be at perhaps moderate risk for cardiovascular complications.  Her angina pattern is very stable and she is not having any worsening angina so I think that the risks are acceptable at this point.  June 01, 2020: Olin Hauser  is seen today after a 3 year absence. We had a telemedicine visit during covid. ( April 2020) She was admitted with opiod overdose in Feb. She was found unconscious with hypotension, shallow respiration  Was given narcan with temporary improvement  Hx of CAD, PAF, HLD , COPD    Past Medical History:  Diagnosis Date  . Anemia   . Anginal pain ([place])    jaw pain 07/2016, s/p 07/15/16 nuclear stress test  . Anxiety   . Arthritis    "lower back" (08/24/2017)  . Asthma   . [place] Obstructive Pulmonary Disease   . Chronic back pain   . Chronic combined systolic and diastolic CHF    EF 7/78: 24-23 // EF 4/21: 55-60 // EF 4/21: 20-25  . Chronic leg pain   . Chronic lower back pain   . Coronary Artery Disease    a. s/p NSTEMI >> CABG 1998 // s/p PCI 2013 // [place] 01/2015: DES to SVG-RCA.// Cath 05/2019: all grafts occluded; mod non-obs LAD disease  w L-L and L-R collats - med Rx // post Ao-Bifem bypass MI c/b PEA, CGS requiring Impella >> anatomy unchanged - Med Rx  . Depression   . Fall 08/2016  . Gastroesophageal reflux disease   . Glucose intolerance   . Headache    "a few/month" (08/24/2017)  . HLD (hyperlipidemia)   . Hx of DVT X 3   RLE  . Hypertension   . Myocardial infarction ([place]) 1999  . OSA on CPAP   . Paroxysmal atrial fibrillation    a. 09/2013 post-op b. 01/2015 // Apixaban // recurrent AFib post MI after Ao-Bifem in 06/2019 >> Amio Rx  . Peripheral artery disease    s/p L fem-fem bypass // s/p Ao-Bifem bypass in 06/2019  . Peripheral neuropathy   . Pneumonia   . Shortness of breath    when walking  . Wears glasses     Past Surgical History:  Procedure Laterality Date  . ABDOMINAL AORTOGRAM W/LOWER EXTREMITY Bilateral 04/29/2019   Procedure: ABDOMINAL AORTOGRAM W/LOWER EXTREMITY;  Surgeon: Waynetta Sandy, MD;  Location: [facility];  Service: Cardiovascular;  Laterality: Bilateral;  . ANTERIOR CERVICAL DECOMP/DISCECTOMY FUSION  1991  . AORTA - BILATERAL FEMORAL ARTERY BYPASS GRAFT N/A 06/18/2019   Procedure: AORTA BIFEMORAL BYPASS GRAFT;  Surgeon: Waynetta Sandy, MD;  Location: [facility];  Service: Vascular;  Laterality: N/A;  . ARTERY REPAIR Left 06/30/2019   Procedure: LEFT BRACHIAL ARTERY REPAIR, EVACUATION OF HEMATOMA;  Surgeon: Serafina Mitchell, MD;  Location: [facility];  Service: Vascular;  Laterality: Left;  . BACK SURGERY    . CARDIAC CATHETERIZATION N/A 01/22/2015   Procedure: Left Heart Cath and Coronary Angiography;  Surgeon: Troy Sine, MD;  Location: [facility];  Service: Cardiovascular;  Laterality: N/A;  . CARDIAC CATHETERIZATION N/A 01/22/2015   Procedure: Coronary Stent Intervention;  Surgeon: Troy Sine, MD;  Location: [facility];  Service: Cardiovascular;  Laterality: N/A;  3.0x12 xience prox SVG to RCA  . CARDIAC CATHETERIZATION  03/09/2000    hx/notes 03/20/2000  . CHOLECYSTECTOMY N/A 09/16/2013   Procedure: LAPAROSCOPIC CHOLECYSTECTOMY CONVERTED TO OPEN CHOLECYSTECTOMY WITH CHOLANGIOGRAM;  Surgeon: [name], MD;  Location: [facility];  Service: General;  Laterality: N/A;  . CORONARY ANGIOGRAPHY N/A 06/27/2019   Procedure: CORONARY ANGIOGRAPHY (CATH LAB);  Surgeon: Sherren Mocha, MD;  Location: [facility];  Service: Cardiovascular;  Laterality: N/A;  . CORONARY ANGIOPLASTY WITH  STENT PLACEMENT  11/2011, 02/2012, 01/2015   DES to SVG-RCA both times, In [place], [place], Dr. Bruce Donath  . CORONARY ARTERY BYPASS GRAFT  1998   LIMA-LAD, SVG-OM, SVG-RCA  . ERCP N/A 09/19/2013   Procedure: ENDOSCOPIC RETROGRADE CHOLANGIOPANCREATOGRAPHY (ERCP);  Surgeon: [name], MD;  Location: [facility];  Service: Endoscopy;  Laterality: N/A;  . FINGER SURGERY Right    ring finger "fused"  . FRACTURE SURGERY    . IRRIGATION AND DEBRIDEMENT ABSCESS Right 10/17/2013   Procedure: INCISION AND DRAINAGE RIGHT SUBCOSTAL WOUND;  Surgeon: [name], MD;  Location: [facility];  Service: General;  Laterality: Right;  . JOINT REPLACEMENT    . KNEE ARTHROSCOPY Right 1998  . LEFT HEART CATH AND CORS/GRAFTS ANGIOGRAPHY N/A 07/25/2016   Procedure: Left Heart Cath and Cors/Grafts Angiography;  Surgeon: Jettie Booze, MD;  Location: [facility];  Service: Cardiovascular;  Laterality: N/A;  . LEFT HEART CATH AND CORS/GRAFTS ANGIOGRAPHY N/A 06/05/2019   Procedure: LEFT HEART CATH AND CORS/GRAFTS ANGIOGRAPHY;  Surgeon: Burnell Blanks, MD;  Location: [facility];  Service: Cardiovascular;  Laterality: N/A;  . LEFT HEART CATHETERIZATION WITH CORONARY/GRAFT ANGIOGRAM N/A 07/19/2013   Procedure: LEFT HEART CATHETERIZATION WITH Beatrix Fetters;  Surgeon: Leonie Man, MD;  Location: [facility];  Service: Cardiovascular;  Laterality: N/A;  . MASS EXCISION Right 06/25/2014   Procedure: EXCISION BUTTOCK MASS ;  Surgeon:  Coralie Keens, MD;  Location: [facility];  Service: General;  Laterality: Right;  . ORIF HUMERUS FRACTURE Left 08/27/2013   Procedure: OPEN REDUCTION INTERNAL FIXATION (ORIF) LEFT HUMERUS ;  Surgeon: Rozanna Box, MD;  Location: [facility];  Service: Orthopedics;  Laterality: Left;  . PLACEMENT OF IMPELLA LEFT VENTRICULAR ASSIST DEVICE N/A 06/24/2019   Procedure: PLACEMENT OF IMPELLA LEFT VENTRICULAR ASSIST DEVICE;  Surgeon: Wonda Olds, MD;  Location: [facility];  Service: Open Heart Surgery;  Laterality: N/A;  . [place]  . REMOVAL OF IMPELLA LEFT VENTRICULAR ASSIST DEVICE Right 06/28/2019   Procedure: REMOVAL OF IMPELLA LEFT VENTRICULAR ASSIST DEVICE;  Surgeon: Wonda Olds, MD;  Location: [facility];  Service: Open Heart Surgery;  Laterality: Right;  . THROMBECTOMY / EMBOLECTOMY FEMORAL ARTERY Right    hx/notes 03/20/2000  . TOTAL HIP ARTHROPLASTY Right 07/19/2016   Procedure: TOTAL HIP ARTHROPLASTY ANTERIOR APPROACH;  Surgeon: [name], MD;  Location: [facility];  Service: Orthopedics;  Laterality: Right;  . TOTAL HIP REVISION Right 08/22/2017   Procedure: TOTAL HIP REVISION;  Surgeon: [name], MD;  Location: [facility];  Service: Orthopedics;  Laterality: Right;     Current Outpatient Medications  Medication Sig Dispense Refill  . acetaminophen (TYLENOL) 500 MG tablet Take 500-1,000 mg by mouth every 6 (six) hours as needed (for pain.).    [place] Kitchen albuterol (PROVENTIL HFA;VENTOLIN HFA) 108 (90 Base) MCG/ACT inhaler Inhale 2 puffs into the lungs every 6 (six) hours as needed for wheezing or shortness of breath. 1 each 1  . albuterol (PROVENTIL) (2.5 MG/3ML) 0.083% nebulizer solution Inhale 2.5 mg into the lungs 3 (three) times daily as needed for shortness of breath or wheezing.    Marland Kitchen ALPRAZolam (XANAX) 0.5 MG tablet Take 0.5 mg by mouth 3 (three) times daily as needed for anxiety.    Mar[place] Kitchen amiodarone (PACERONE) 100 MG tablet Take 1 tablet (100 mg  total) by mouth daily. 90 tablet 2  . apixaban (ELIQUIS) 5 MG TABS tablet  Take 1 tablet (5 mg total) by mouth 2 (two) times daily. 180 tablet 1  . aspirin 81 MG chewable tablet Chew 1 tablet (81 mg total) by mouth daily.    Marland Kitchen atorvastatin (LIPITOR) 80 MG tablet Take 1 tablet (80 mg total) by mouth daily. 90 tablet 1  . buPROPion (WELLBUTRIN XL) 150 MG 24 hr tablet Take 150 mg by mouth daily.    . diclofenac Sodium (VOLTAREN) 1 % GEL Apply 1 g topically 4 (four) times daily as needed for pain.    Mar[place] Kitchen docusate sodium (COLACE) 100 MG capsule Take 100 mg by mouth daily as needed for mild constipation.    Marland Kitchen ezetimibe (ZETIA) 10 MG tablet Take 10 mg by mouth daily.    . furosemide (LASIX) 20 MG tablet Take 1 tablet (20 mg total) by mouth every other day. Please call and schedule an appointment for further refills 1st attempt (Patient taking differently: Take 20 mg by mouth daily as needed for fluid or edema.) 45 tablet 0  . gabapentin (NEURONTIN) 300 MG capsule Take 300 mg by mouth 3 (three) times daily.    . melatonin 5 MG TABS Take 5 mg by mouth at bedtime as needed (sleep).    . nitroGLYCERIN (NITROSTAT) 0.4 MG SL tablet Place 1 tablet (0.4 mg total) under the tongue every 5 (five) minutes as needed for chest pain. 25 tablet 5  . oxyCODONE 10 MG TABS Take 1 tablet (10 mg total) by mouth 3 (three) times daily. (Patient taking differently: Take 10 mg by mouth 4 (four) times daily as needed (pain).) 30 tablet 0  . pantoprazole (PROTONIX) 40 MG tablet TAKE ONE TABLET BY MOUTH EVERY MORNING (Patient taking differently: Take 40 mg by mouth daily.) 90 tablet 0  . pramipexole (MIRAPEX) 0.25 MG tablet Take 0.25 mg by mouth at bedtime.     . promethazine (PHENERGAN) 12.5 MG tablet TAKE ONE TABLET BY MOUTH EVERY 6 HOURS AS NEEDED FOR NAUSEA AND VOMITING (Patient taking differently: Take 12.5 mg by mouth every 6 (six) hours.) 30 tablet 3  . ranolazine (RANEXA) 1000 MG SR tablet Take 1,000 mg by mouth 2 (two) times  daily.    . sacubitril-valsartan (ENTRESTO) 24-26 MG Take 1 tablet by mouth 2 (two) times daily. 180 tablet 1  . sertraline (ZOLOFT) 100 MG tablet Take 150 mg by mouth at bedtime.      Current Facility-Administered Medications  Medication Dose Route Frequency Provider Last Rate Last Admin  . ferrous sulfate tablet 325 mg  325 mg Oral BID WC Irene Shipper, MD        Allergies:   Benadryl [diphenhydramine], Penicillins, Adhesive [tape], Amoxicillin, Hydrocodone-acetaminophen, Cyclobenzaprine, and Hydrocodone    Social History:  The patient  reports that she quit smoking about 21 years ago. Her smoking use included cigarettes. She has a 17.00 pack-year smoking history. She has never used smokeless tobacco. She reports previous alcohol use. She reports current drug use. Drug: Marijuana.   Family History:  The patient's family history includes Clotting disorder in her father; Heart disease in her brother and brother; Lung cancer in her mother and sister.    ROS:   Noted in current history, otherwise systems are negative.  Physical Exam: There were no vitals taken for this visit.  GEN:  Well nourished, well developed in no acute distress HEENT: Normal NECK: No JVD; No carotid bruits LYMPHATICS: No lymphadenopathy CARDIAC: RRR ***, no murmurs, rubs, gallops RESPIRATORY:  Clear to auscultation without rales,  wheezing or rhonchi  ABDOMEN: Soft, non-tender, non-distended MUSCULOSKELETAL:  No edema; No deformity  SKIN: Warm and dry NEUROLOGIC:  Alert and oriented x 3    EKG:     Recent Labs: 09/19/2019: B Natriuretic Peptide 200.9 04/10/2020: ALT 14; BUN 12; Creatinine, Ser 0.85; Hemoglobin 10.2; Magnesium 1.5; Platelets 132; Potassium 3.7; Sodium 140    Lipid Panel    Component Value Date/Time   CHOL 98 (L) 12/05/2016 1139   TRIG 73 12/05/2016 1139   HDL 43 12/05/2016 1139   CHOLHDL 2.3 12/05/2016 1139   CHOLHDL 4.1 08/13/2015 0939   VLDL 48 (H) 08/13/2015 0939   LDLCALC 40  12/05/2016 1139      Wt Readings from Last 3 Encounters:  04/11/20 131 lb 13.4 oz (59.8 kg)  02/24/20 126 lb 12.8 oz (57.5 kg)  09/22/19 127 lb 12.8 oz (58 kg)      Other studies Reviewed: Additional studies/ records that were reviewed today include: . Review of the above records demonstrates:    ASSESSMENT AND PLAN:  1. Coronary artery disease:  Cath in May, 2018 shows Attretic LIMA to LAD, occluded SVG to OM, occluded SVG to PDA.  She has  severe coronary artery disease.      2. Paroxysmal atrial fibrillation:        Current medicines are reviewed at length with the patient today.  The patient does not have concerns regarding medicines.  The following changes have been made:  no change  Labs/ tests ordered today include:  No orders of the defined types were placed in this encounter.   Disposition:   FU with me in 1 year    [name], MD  05/31/2020 8:51 PM    [facility] [address], [place], [place]  [address] Phone: [phone]; Fax: [phone]

## 2020-06-01 ENCOUNTER — Ambulatory Visit: Payer: Medicare HMO | Admitting: Cardiovascular Disease

## 2020-06-03 DIAGNOSIS — I208 Other forms of angina pectoris: Secondary | ICD-10-CM | POA: Diagnosis not present

## 2020-06-03 DIAGNOSIS — I10 Essential (primary) hypertension: Secondary | ICD-10-CM | POA: Diagnosis not present

## 2020-06-03 DIAGNOSIS — I25810 Atherosclerosis of coronary artery bypass graft(s) without angina pectoris: Secondary | ICD-10-CM | POA: Diagnosis not present

## 2020-06-03 DIAGNOSIS — I509 Heart failure, unspecified: Secondary | ICD-10-CM | POA: Diagnosis not present

## 2020-06-03 DIAGNOSIS — J449 Chronic obstructive pulmonary disease, unspecified: Secondary | ICD-10-CM | POA: Diagnosis not present

## 2020-06-03 DIAGNOSIS — J441 Chronic obstructive pulmonary disease with (acute) exacerbation: Secondary | ICD-10-CM | POA: Diagnosis not present

## 2020-06-03 DIAGNOSIS — I480 Paroxysmal atrial fibrillation: Secondary | ICD-10-CM | POA: Diagnosis not present

## 2020-06-03 DIAGNOSIS — E785 Hyperlipidemia, unspecified: Secondary | ICD-10-CM | POA: Diagnosis not present

## 2020-06-03 DIAGNOSIS — F322 Major depressive disorder, single episode, severe without psychotic features: Secondary | ICD-10-CM | POA: Diagnosis not present

## 2020-06-04 DIAGNOSIS — I10 Essential (primary) hypertension: Secondary | ICD-10-CM | POA: Diagnosis not present

## 2020-06-26 ENCOUNTER — Other Ambulatory Visit: Payer: Self-pay | Admitting: Cardiovascular Disease

## 2020-07-16 ENCOUNTER — Telehealth: Payer: Self-pay | Admitting: Cardiovascular Disease

## 2020-07-16 NOTE — Telephone Encounter (Signed)
Attempted to call the pt but her number listed is not in service.   She needs follow up in the office.   LM for her sister listed on her demographics.

## 2020-07-16 NOTE — Telephone Encounter (Signed)
  Pt c/o medication issue:  1. Name of Medication: amiodarone (PACERONE) 100 MG tablet  2. How are you currently taking this medication (dosage and times per day)? As directed  3. Are you having a reaction (difficulty breathing--STAT)?  NA  4. What is your medication issue? Insurance no longer wants to pay for this medication. Is there something else she can take that insurance will cover?

## 2020-07-28 NOTE — Telephone Encounter (Signed)
Kenitra from [facility] states the patient has not heard back about her medication. Patient is scheduled 08/24/2020. She states she was just on the phone with the patient and gave a different number for her: [phone]

## 2020-08-04 NOTE — Telephone Encounter (Signed)
Left message to call back to the office to schedule appointment.

## 2020-08-24 ENCOUNTER — Other Ambulatory Visit: Payer: Self-pay

## 2020-08-24 ENCOUNTER — Encounter: Payer: Self-pay | Admitting: Family

## 2020-08-24 ENCOUNTER — Ambulatory Visit (INDEPENDENT_AMBULATORY_CARE_PROVIDER_SITE_OTHER): Payer: Medicare Other | Admitting: Family

## 2020-08-24 VITALS — BP 118/58 | HR 59 | Ht 61.0 in | Wt 124.0 lb

## 2020-08-24 DIAGNOSIS — I10 Essential (primary) hypertension: Secondary | ICD-10-CM

## 2020-08-24 DIAGNOSIS — I25118 Atherosclerotic heart disease of native coronary artery with other forms of angina pectoris: Secondary | ICD-10-CM

## 2020-08-24 DIAGNOSIS — Z79899 Other long term (current) drug therapy: Secondary | ICD-10-CM

## 2020-08-24 DIAGNOSIS — I255 Ischemic cardiomyopathy: Secondary | ICD-10-CM

## 2020-08-24 DIAGNOSIS — E785 Hyperlipidemia, unspecified: Secondary | ICD-10-CM | POA: Diagnosis not present

## 2020-08-24 DIAGNOSIS — Z7901 Long term (current) use of anticoagulants: Secondary | ICD-10-CM

## 2020-08-24 MED ORDER — AMIODARONE HCL 100 MG PO TABS: 100.0000 mg | ORAL_TABLET | Freq: Every morning | ORAL | 1 refills | Status: DC

## 2020-08-24 NOTE — Patient Instructions (Addendum)
Medication Instructions:  Continue your current medications.   *If you need a refill on your cardiac medications before your next appointment, please call your pharmacy*   Lab Work: None ordered today.  When you see Dr. Inda Merlin the next time, recommend having your cholesterol panel checked.   Testing/Procedures: Your EKG today showed sinus bradycardia which is a stable finding and normal heart rhythm.    Follow-Up: At [facility], you and your health needs are our priority.  As part of our continuing mission to provide you with exceptional heart care, we have created designated Provider Care Teams.  These Care Teams include your primary Cardiologist (physician) and Advanced Practice Providers (APPs -  Physician Assistants and Nurse Practitioners) who all work together to provide you with the care you need, when you need it.  We recommend signing up for the patient portal called "MyChart".  Sign up information is provided on this After Visit Summary.  MyChart is used to connect with patients for Virtual Visits (Telemedicine).  Patients are able to view lab/test results, encounter notes, upcoming appointments, etc.  Non-urgent messages can be sent to your provider as well.   To learn more about what you can do with MyChart, go to [email or web address].    Your next appointment:   6 month(s)   02/09/2021 ARRIVE AT 9:45   The format for your next appointment:   In Person  Provider:   You may see [name], MD or one of the following Advanced Practice Providers on your designated Care Team:   Richardson Dopp, PA-C [place], [place]   Other Instructions  Heart Healthy Diet Recommendations: A low-salt diet is recommended. Meats should be grilled, baked, or boiled. Avoid fried foods. Focus on lean protein sources like fish or chicken with vegetables and fruits. The American Heart Association is a Microbiologist!  American Heart Association Diet and Lifeystyle Recommendations   Exercise  recommendations: The American Heart Association recommends 150 minutes of moderate intensity exercise weekly. Try 30 minutes of moderate intensity exercise 4-5 times per week. This could include walking, jogging, or swimming.  Recommend weighing daily and keeping a log. Please call our office if you have weight gain of 2 pounds overnight or 5 pounds in 1 week.

## 2020-08-24 NOTE — Progress Notes (Signed)
Office Visit    Patient Name: Wendy [name] Date of Encounter: 08/24/2020  PCP:  Josetta Huddle, MD   [facility]  Cardiologist:  [name], MD  Advanced Practice Provider:  No care team member to display Electrophysiologist:  None   Chief Complaint    Wendy [name] is a 70 y.o. female with a hx of AD, CAD s/p NSTEMI s/p CABG X3 (SVG-OM, LIMA-LAD, SVG-RCA in 1990) with PCI in 2013 and 25.  Congestive heart failure, COPD,, peripheral neuropathy, polysubstance use presents today for follow up of CAD and HF.   Past Medical History    Past Medical History:  Diagnosis Date   Anemia    Anginal pain ([place])    jaw pain 07/2016, s/p 07/15/16 nuclear stress test   Anxiety    Arthritis    "lower back" (08/24/2017)   Asthma    [place] Obstructive Pulmonary Disease    Chronic back pain    Chronic combined systolic and diastolic CHF    EF 6/23: 76-28 // EF 4/21: 55-60 // EF 4/21: 20-25   Chronic leg pain    Chronic lower back pain    Coronary Artery Disease    a. s/p NSTEMI >> CABG 1998 // s/p PCI 2013 // [place] 01/2015: DES to SVG-RCA.// Cath 05/2019: all grafts occluded; mod non-obs LAD disease w L-L and L-R collats - med Rx // post Ao-Bifem bypass MI c/b PEA, CGS requiring Impella >> anatomy unchanged - Med Rx   Depression    Fall 08/2016   Gastroesophageal reflux disease    Glucose intolerance    Headache    "a few/month" (08/24/2017)   HLD (hyperlipidemia)    Hx of DVT X 3   RLE   Hypertension    Myocardial infarction ([place]) 1999   OSA on CPAP    Paroxysmal atrial fibrillation    a. 09/2013 post-op b. 01/2015 // Apixaban // recurrent AFib post MI after Ao-Bifem in 06/2019 >> Amio Rx   Peripheral artery disease    s/p L fem-fem bypass // s/p Ao-Bifem bypass in 06/2019   Peripheral neuropathy    Pneumonia    Shortness of breath    when walking   Wears glasses    Past Surgical History:  Procedure Laterality Date   ABDOMINAL AORTOGRAM W/LOWER  EXTREMITY Bilateral 04/29/2019   Procedure: ABDOMINAL AORTOGRAM W/LOWER EXTREMITY;  Surgeon: Waynetta Sandy, MD;  Location: [facility];  Service: Cardiovascular;  Laterality: Bilateral;   ANTERIOR CERVICAL DECOMP/DISCECTOMY FUSION  1991   AORTA - BILATERAL FEMORAL ARTERY BYPASS GRAFT N/A 06/18/2019   Procedure: AORTA BIFEMORAL BYPASS GRAFT;  Surgeon: Waynetta Sandy, MD;  Location: [place];  Service: Vascular;  Laterality: N/A;   ARTERY REPAIR Left 06/30/2019   Procedure: LEFT BRACHIAL ARTERY REPAIR, EVACUATION OF HEMATOMA;  Surgeon: Serafina Mitchell, MD;  Location: [place];  Service: Vascular;  Laterality: Left;   BACK SURGERY     CARDIAC CATHETERIZATION N/A 01/22/2015   Procedure: Left Heart Cath and Coronary Angiography;  Surgeon: Troy Sine, MD;  Location: [facility];  Service: Cardiovascular;  Laterality: N/A;   CARDIAC CATHETERIZATION N/A 01/22/2015   Procedure: Coronary Stent Intervention;  Surgeon: Troy Sine, MD;  Location: [facility];  Service: Cardiovascular;  Laterality: N/A;  3.0x12 xience prox SVG to RCA   CARDIAC CATHETERIZATION  03/09/2000   hx/notes 03/20/2000   CHOLECYSTECTOMY N/A 09/16/2013   Procedure: LAPAROSCOPIC CHOLECYSTECTOMY  CONVERTED TO OPEN CHOLECYSTECTOMY WITH CHOLANGIOGRAM;  Surgeon: [name], MD;  Location: [place];  Service: General;  Laterality: N/A;   CORONARY ANGIOGRAPHY N/A 06/27/2019   Procedure: CORONARY ANGIOGRAPHY (CATH LAB);  Surgeon: Sherren Mocha, MD;  Location: [place] [facility];  Service: Cardiovascular;  Laterality: N/A;   CORONARY ANGIOPLASTY WITH STENT PLACEMENT  11/2011, 02/2012, 01/2015   DES to SVG-RCA both times, In [place], [place], Dr. Bruce Donath   CORONARY ARTERY BYPASS GRAFT  1998   LIMA-LAD, SVG-OM, SVG-RCA   ERCP N/A 09/19/2013   Procedure: ENDOSCOPIC RETROGRADE CHOLANGIOPANCREATOGRAPHY (ERCP);  Surgeon: [name], MD;  Location: [place];  Service: Endoscopy;  Laterality: N/A;    FINGER SURGERY Right    ring finger "fused"   FRACTURE SURGERY     IRRIGATION AND DEBRIDEMENT ABSCESS Right 10/17/2013   Procedure: INCISION AND DRAINAGE RIGHT SUBCOSTAL WOUND;  Surgeon: [name], MD;  Location: [facility];  Service: General;  Laterality: Right;   JOINT REPLACEMENT     KNEE ARTHROSCOPY Right 1998   LEFT HEART CATH AND CORS/GRAFTS ANGIOGRAPHY N/A 07/25/2016   Procedure: Left Heart Cath and Cors/Grafts Angiography;  Surgeon: Jettie Booze, MD;  Location: [facility];  Service: Cardiovascular;  Laterality: N/A;   LEFT HEART CATH AND CORS/GRAFTS ANGIOGRAPHY N/A 06/05/2019   Procedure: LEFT HEART CATH AND CORS/GRAFTS ANGIOGRAPHY;  Surgeon: Burnell Blanks, MD;  Location: [facility];  Service: Cardiovascular;  Laterality: N/A;   LEFT HEART CATHETERIZATION WITH CORONARY/GRAFT ANGIOGRAM N/A 07/19/2013   Procedure: LEFT HEART CATHETERIZATION WITH Beatrix Fetters;  Surgeon: Leonie Man, MD;  Location: [facility];  Service: Cardiovascular;  Laterality: N/A;   MASS EXCISION Right 06/25/2014   Procedure: EXCISION BUTTOCK MASS ;  Surgeon: Coralie Keens, MD;  Location: [facility];  Service: General;  Laterality: Right;   ORIF HUMERUS FRACTURE Left 08/27/2013   Procedure: OPEN REDUCTION INTERNAL FIXATION (ORIF) LEFT HUMERUS ;  Surgeon: Rozanna Box, MD;  Location: [place];  Service: Orthopedics;  Laterality: Left;   PLACEMENT OF IMPELLA LEFT VENTRICULAR ASSIST DEVICE N/A 06/24/2019   Procedure: PLACEMENT OF IMPELLA LEFT VENTRICULAR ASSIST DEVICE;  Surgeon: Wonda Olds, MD;  Location: [facility];  Service: Open Heart Surgery;  Laterality: N/A;   POSTERIOR FUSION CERVICAL SPINE  1992   REMOVAL OF IMPELLA LEFT VENTRICULAR ASSIST DEVICE Right 06/28/2019   Procedure: REMOVAL OF IMPELLA LEFT VENTRICULAR ASSIST DEVICE;  Surgeon: Wonda Olds, MD;  Location: [place];  Service: Open Heart Surgery;  Laterality: Right;   THROMBECTOMY /  EMBOLECTOMY FEMORAL ARTERY Right    hx/notes 03/20/2000   TOTAL HIP ARTHROPLASTY Right 07/19/2016   Procedure: TOTAL HIP ARTHROPLASTY ANTERIOR APPROACH;  Surgeon: [name], MD;  Location: [facility];  Service: Orthopedics;  Laterality: Right;   TOTAL HIP REVISION Right 08/22/2017   Procedure: TOTAL HIP REVISION;  Surgeon: [name], MD;  Location: [place];  Service: Orthopedics;  Laterality: Right;    Allergies  Allergies  Allergen Reactions   Benadryl [Diphenhydramine] Shortness Of Breath   Penicillins Diarrhea    Did it involve swelling of the face/tongue/throat, SOB, or low BP? No Did it involve sudden or severe rash/hives, skin peeling, or any reaction on the inside of your mouth or nose? No Did you need to seek medical attention at a hospital or doctor's office? No When did it last happen?      2 months If all above answers are "  NO", may proceed with cephalosporin use.   Adhesive [Tape] Other (See Comments)    Burning of skin   Amoxicillin Diarrhea and Other (See Comments)    Has patient had a PCN reaction causing immediate rash, facial/tongue/throat swelling, SOB or lightheadedness with hypotension: No Has patient had a PCN reaction causing severe rash involving mucus membranes or skin necrosis: No Has patient had a PCN reaction that required hospitalization No Has patient had a PCN reaction occurring within the last 10 years: Yes -- noted rxn as diarrhea If all of the above answers are "NO", then may proceed with Cephalosporin use.    Hydrocodone-Acetaminophen Other (See Comments)   Cyclobenzaprine Other (See Comments)    Causes restless leg syndrome Restless syndrome   Hydrocodone Itching    History of Present Illness    Wendy [name] is a 70 y.o. female with a hx of PAD, CAD s/p NSTEMI s/p CABG X3 (SVG-OM, LIMA-LAD, SVG-RCA in 1990) with PCI in 2013 and 25.  Congestive heart failure, COPD,, peripheral neuropathy, polysubstance use last seen in clinic 05/15/20 by  Robbie Lis, PA.  Cardiac catheterization May 2018 occluded SVG to marginal known from previous.  LIMA to LAD with atretic which was stable compared to previous.  SVG to PDA occluded which is new finding.  Mid LAD lesion 50% stenosed with was ulcerated, irregular lesion with left-to-right collaterals.  LVEF 25 to 35%.  Significant PAD with previous femoral-femoral bypass left to right.  She was last seen in the clinic May 16, 2019 and recommended for ischemic work-up prior to planned vascular surgery.  Cardiac catheterization 06/27/2019 showed severe multivessel CAD with total occlusion of RCA, first diagonal, first on branches.  All CABG vessels were known to be occluded.  Moderate calcific proximal LAD stenosis unchanged from prior study.  Distal RCA and LCx territories were collateralized by branches of LAD.  Noted overall stable coronary anatomy.  She underwent aortobifemoral bypass with reimplantation of right profunda on aortofemoral bifemoral limb by Dr. Donzetta Matters 06/18/2019.  She was admitted July 2021 due to heart failure exacerbation.  Echo 09/12/2019 LVEF 45 to 50%, wall motion abnormalities, grade 1 diastolic dysfunction, RV normal size and function, trivial MR.   She was seen by advanced heart failure team during admission and transitioned to [facility].  Readmitted later that month with acute encephalopathy.  Determined to be related to hypotension in the setting of multiple changes to her antihypertensive regimen.  Her Aldactone and hydralazine discontinued.  Lasix, Entresto were continued.  Admitted 04/09/2020 after accidental opiate overdose requiring IV Narcan.  Psychiatry evaluated with no recommendation for inpatient psych admission.  She presents today for follow-up.  Overall sedentary and spends most of her day watching TV.  Tells me she occasionally will walk around at the store.  Reports no chest pain, pressure, tightness.  Reports stable exertional dyspnea at her baseline.  Reports very  slight edema in her hands making it difficult to get her rings off which improves throughout the day.  Endorses following a low salt diet and eats predominantly at home.  Reports no lightheadedness, dizziness, near-syncope, syncope.  Tells me her insurance company no longer wants to pay for her amiodarone and we discussed the utility of amiodarone in preventing recurrent atrial fibrillation.  We have reached out to her pharmacy and the med tech assigned to her case will reach back out to [place].  EKGs/Labs/Other Studies Reviewed:   The following studies were reviewed today:  Cardiac cath 06/2019 1. Severe  multivessel CAD with total occlusion of the RCA, first diagonal, and first OM branches 2. 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 Moderate calcific proximal LAD stenosis unchanged from prior cath study 4. Distal RCA and LCx territories collateralized by branches of the LAD   Stable coronary anatomy - continue medical therapy/supportive measures  Echo 09/12/19  1. Left ventricular ejection fraction, by estimation, is 45 to 50%. The  left ventricle has mildly decreased function. The left ventricle  demonstrates regional wall motion abnormalities with basal inferior  hypokinesis and inferoseptal hypokinesis. Left  ventricular diastolic parameters are consistent with Grade I diastolic  dysfunction (impaired relaxation).   2. Right ventricular systolic function is normal. The right ventricular  size is normal. Tricuspid regurgitation signal is inadequate for assessing  PA pressure.   3. The mitral valve is normal in structure. Trivial mitral valve  regurgitation. No evidence of mitral stenosis.   4. The aortic valve is tricuspid. Aortic valve regurgitation is not  visualized. No aortic stenosis is present.   5. The inferior vena cava is normal in size with greater than 50%  respiratory variability, suggesting right atrial pressure of 3 mmHg.   EKG:  EKG is  ordered today.  The ekg  ordered today demonstrates sinus bradycardia 59 bpm with IVCD.  Recent Labs: 09/19/2019: B Natriuretic Peptide 200.9 04/10/2020: ALT 14; BUN 12; Creatinine, Ser 0.85; Hemoglobin 10.2; Magnesium 1.5; Platelets 132; Potassium 3.7; Sodium 140  Recent Lipid Panel    Component Value Date/Time   CHOL 98 (L) 12/05/2016 1139   TRIG 73 12/05/2016 1139   HDL 43 12/05/2016 1139   CHOLHDL 2.3 12/05/2016 1139   CHOLHDL 4.1 08/13/2015 0939   VLDL 48 (H) 08/13/2015 0939   LDLCALC 40 12/05/2016 1139    Risk Assessment/Calculations:   CHA2DS2-VASc Score = 5  his indicates a 7.2% annual risk of stroke. The patient's score is based upon: CHF History: Yes HTN History: Yes Diabetes History: No Stroke History: No Vascular Disease History: Yes Age Score: 1 Gender Score: 1   Home Medications   Current Meds  Medication Sig   acetaminophen (TYLENOL) 500 MG tablet Take 500-1,000 mg by mouth every 6 (six) hours as needed (for pain.).   albuterol (PROVENTIL HFA;VENTOLIN HFA) 108 (90 Base) MCG/ACT inhaler Inhale 2 puffs into the lungs every 6 (six) hours as needed for wheezing or shortness of breath.   albuterol (PROVENTIL) (2.5 MG/3ML) 0.083% nebulizer solution Inhale 2.5 mg into the lungs 3 (three) times daily as needed for shortness of breath or wheezing.   ALPRAZolam (XANAX) 0.5 MG tablet Take 0.5 mg by mouth 3 (three) times daily as needed for anxiety.   amiodarone (PACERONE) 100 MG tablet Take 1 tablet (100 mg total) by mouth every morning. Pt needs to make appt with provider before further refills - 1st attempt   apixaban (ELIQUIS) 5 MG TABS tablet Take 1 tablet (5 mg total) by mouth 2 (two) times daily.   aspirin 81 MG chewable tablet Chew 1 tablet (81 mg total) by mouth daily.   atorvastatin (LIPITOR) 80 MG tablet Take 1 tablet (80 mg total) by mouth daily.   buPROPion (WELLBUTRIN XL) 150 MG 24 hr tablet Take 150 mg by mouth daily.   diclofenac Sodium (VOLTAREN) 1 % GEL Apply 1 g topically 4  (four) times daily as needed for pain.   docusate sodium (COLACE) 100 MG capsule Take 100 mg by mouth daily as needed for mild constipation.  ezetimibe (ZETIA) 10 MG tablet Take 10 mg by mouth daily.   furosemide (LASIX) 20 MG tablet Take 20 mg by mouth as needed for fluid or edema.   gabapentin (NEURONTIN) 300 MG capsule Take 300 mg by mouth 3 (three) times daily.   melatonin 5 MG TABS Take 5 mg by mouth at bedtime as needed (sleep).   nitroGLYCERIN (NITROSTAT) 0.4 MG SL tablet Place 1 tablet (0.4 mg total) under the tongue every 5 (five) minutes as needed for chest pain.   oxyCODONE 10 MG TABS Take 1 tablet (10 mg total) by mouth 3 (three) times daily. (Patient taking differently: Take 10 mg by mouth 4 (four) times daily as needed (pain).)   pantoprazole (PROTONIX) 40 MG tablet TAKE ONE TABLET BY MOUTH EVERY MORNING (Patient taking differently: Take 40 mg by mouth daily.)   pramipexole (MIRAPEX) 0.25 MG tablet Take 0.25 mg by mouth at bedtime.    promethazine (PHENERGAN) 12.5 MG tablet TAKE ONE TABLET BY MOUTH EVERY 6 HOURS AS NEEDED FOR NAUSEA AND VOMITING (Patient taking differently: Take 12.5 mg by mouth every 6 (six) hours.)   ranolazine (RANEXA) 1000 MG SR tablet Take 1,000 mg by mouth 2 (two) times daily.   sacubitril-valsartan (ENTRESTO) 24-26 MG Take 1 tablet by mouth 2 (two) times daily. Pt needs to make appt with provider for further refills - 1st attempt   sertraline (ZOLOFT) 100 MG tablet Take 150 mg by mouth at bedtime.    [DISCONTINUED] furosemide (LASIX) 20 MG tablet Take 1 tablet (20 mg total) by mouth every other day. Please call and schedule an appointment for further refills 1st attempt (Patient taking differently: Take 20 mg by mouth daily as needed for fluid or edema.)   Current Facility-Administered Medications for the 08/24/20 encounter (Office Visit) with Loel Dubonnet, NP  Medication   ferrous sulfate tablet 325 mg     Review of Systems   All other systems  reviewed and are otherwise negative except as noted above.  Physical Exam    VS:  BP (!) 118/58   Pulse (!) 59   Ht 5\' 1"  (1.549 m)   Wt 124 lb (56.2 kg)   BMI 23.43 kg/m  , BMI Body mass index is 23.43 kg/m.  Wt Readings from Last 3 Encounters:  08/24/20 124 lb (56.2 kg)  04/11/20 131 lb 13.4 oz (59.8 kg)  02/24/20 126 lb 12.8 oz (57.5 kg)     GEN: Well nourished, well developed, in no acute distress. HEENT: normal. Neck: Supple, no JVD, carotid bruits, or masses. Cardiac: RRR, no murmurs, rubs, or gallops. No clubbing, cyanosis, edema.  Radials 2+ and equal bilaterally.  Respiratory:  Respirations regular and unlabored, clear to auscultation bilaterally. GI: Soft, nontender, nondistended. MS: No deformity or atrophy. Skin: Warm and dry, no rash. Neuro:  Strength and sensation are intact. Psych: Normal affect.  Assessment & Plan    ICM / Combined systolic and diastolic heart failure - Euvolemic and well compensated on exam. GDMT includes Entresto, Lasix. No beta blocker due to baseline bradycardia. No MRA due to previous hypotension. Heart healthy diet and regular cardiovascular exercise encouraged.   Coronary artery disease- Stable exertional dyspnea. Known occluded grafts. GDMT includes aspirin, ranexa, zetia, atorvastatin. No indication for ischemic evaluation. EKG today SB 59bpm with no acte ST/T wave changes. Heart healthy diet and regular cardiovascular exercise encouraged.   PAF / Chronic anticoagulation / On Amiodarone therapy - CHA2DS2-VASc Score = 5 [CHF History: Yes, HTN History: Yes,  Diabetes History: No, Stroke History: No, Vascular Disease History: Yes, Age Score: 1, Gender Score: 1].  Therefore, the patient's annual risk of stroke is 7.2 %.  Continue Eliquis 5mg  BID. Denies bleeding complications. Maintaining NSR by EKG today.  Denies palpitations.  Continue amiodarone 100 mg daily.  No signs of toxicity. Of note, have reached out to her pharmacy as she tells me  there was difficulty filling it in the will call us back. Anticipate this was misunderstanding by patient as she is covered by Medicare/Medicaid which routinely cover Amiodarone.   HTN- BP well controlled. Continue current antihypertensive regimen.   HLD, LDL goal from 70 - No recent lipid panel. She politely declines lab work today as she has labs next week with PCP. If LDL not at goal of <130/80 would be good candidate for PCSK9i or Incliseron.   PAD- Continue to follow with vascular surgery.  Disposition: Follow up in 6 month(s) with Dr. [name] or APP   Signed, Loel Dubonnet, NP 08/24/2020, 10:27 AM [place]

## 2020-08-31 DIAGNOSIS — I509 Heart failure, unspecified: Secondary | ICD-10-CM | POA: Diagnosis not present

## 2020-08-31 DIAGNOSIS — D6869 Other thrombophilia: Secondary | ICD-10-CM | POA: Diagnosis not present

## 2020-08-31 DIAGNOSIS — I480 Paroxysmal atrial fibrillation: Secondary | ICD-10-CM | POA: Diagnosis not present

## 2020-08-31 DIAGNOSIS — I739 Peripheral vascular disease, unspecified: Secondary | ICD-10-CM | POA: Diagnosis not present

## 2020-09-03 DIAGNOSIS — J449 Chronic obstructive pulmonary disease, unspecified: Secondary | ICD-10-CM | POA: Diagnosis not present

## 2020-09-03 DIAGNOSIS — I509 Heart failure, unspecified: Secondary | ICD-10-CM | POA: Diagnosis not present

## 2020-09-03 DIAGNOSIS — E785 Hyperlipidemia, unspecified: Secondary | ICD-10-CM | POA: Diagnosis not present

## 2020-09-03 DIAGNOSIS — I10 Essential (primary) hypertension: Secondary | ICD-10-CM | POA: Diagnosis not present

## 2020-09-03 DIAGNOSIS — I25810 Atherosclerosis of coronary artery bypass graft(s) without angina pectoris: Secondary | ICD-10-CM | POA: Diagnosis not present

## 2020-09-03 DIAGNOSIS — I480 Paroxysmal atrial fibrillation: Secondary | ICD-10-CM | POA: Diagnosis not present

## 2020-09-04 DIAGNOSIS — N390 Urinary tract infection, site not specified: Secondary | ICD-10-CM | POA: Diagnosis not present

## 2020-09-04 DIAGNOSIS — M5432 Sciatica, left side: Secondary | ICD-10-CM | POA: Diagnosis not present

## 2020-09-11 DIAGNOSIS — M461 Sacroiliitis, not elsewhere classified: Secondary | ICD-10-CM | POA: Diagnosis not present

## 2020-09-14 ENCOUNTER — Other Ambulatory Visit: Payer: Self-pay | Admitting: Cardiovascular Disease

## 2020-09-16 ENCOUNTER — Other Ambulatory Visit: Payer: Self-pay | Admitting: Internal Medicine

## 2020-09-16 ENCOUNTER — Ambulatory Visit
Admission: RE | Admit: 2020-09-16 | Discharge: 2020-09-16 | Disposition: A | Discharge status: Home / Self Care | Payer: Medicare Other | Source: Ambulatory Visit | Attending: Internal Medicine | Admitting: Internal Medicine

## 2020-09-16 DIAGNOSIS — R1032 Left lower quadrant pain: Secondary | ICD-10-CM

## 2020-09-16 DIAGNOSIS — R1030 Lower abdominal pain, unspecified: Secondary | ICD-10-CM | POA: Diagnosis not present

## 2020-09-22 ENCOUNTER — Ambulatory Visit (INDEPENDENT_AMBULATORY_CARE_PROVIDER_SITE_OTHER): Payer: Medicare Other | Admitting: Family Medicine

## 2020-09-22 ENCOUNTER — Other Ambulatory Visit: Payer: Self-pay

## 2020-09-22 ENCOUNTER — Other Ambulatory Visit: Payer: Self-pay | Admitting: Cardiovascular Disease

## 2020-09-22 ENCOUNTER — Ambulatory Visit: Payer: Self-pay

## 2020-09-22 DIAGNOSIS — M25552 Pain in left hip: Secondary | ICD-10-CM

## 2020-09-22 NOTE — Progress Notes (Signed)
Office Visit Note   Patient: Wendy [name]           Date of Birth: 11-02-1950           MRN: [id number] Visit Date: 09/22/2020 Requested by: Josetta Huddle, MD [address] [place],  [place] [address] PCP: Josetta Huddle, MD  Subjective: Chief Complaint  Patient presents with   Left Hip - Pain    Pain in the groin, posterior hip and down into the thigh, occasional lower leg. The pain "moves around." Has had this for almost 2 months.     HPI: She is here with left posterior hip pain and groin pain.  Symptoms started about 2 months ago, no injury.  Pain seems to move around from the posterior hip to the anterior and then down the front of the thigh toward the knee.  No numbness or tingling in her leg, no bowel or bladder dysfunction.  The pain has become constant.  She was referred for possible hip injection but she had x-rays recently which did not show much in the way of arthritis.  She has had back surgery in the past, she has had spine injections with no improvement.  She has had right hip replacement.  She is taking gabapentin and oxycodone for her current symptoms.                ROS:   All other systems were reviewed and are negative.  Objective: Vital Signs: There were no vitals taken for this visit.  Physical Exam:  General:  Alert and oriented, in no acute distress. Pulm:  Breathing unlabored. Psy:  Normal mood, congruent affect  Left hip: She is maximally tender along the left SI joint.  No significant pain with passive flexion and internal/external hip rotation.  She has good range of motion.  Negative straight leg raise, lower extremity strength and reflexes are normal.    Imaging: No results found.  Assessment & Plan: Left hip pain, with exam suggesting sacroiliac dysfunction.  Cannot rule out upper lumbar disc protrusion. -Discussed options, she has had good results in the past with chiropractic at Dr. Yetta Barre office.  She plans to go there for  treatment.  If her symptoms persist, could consider SI joint injection.     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[place]) 06/18/2019   Coronary artery disease of native artery of native heart with stable angina pectoris ([place])    Infection of right prosthetic hip joint ([place]) 38/25/0539   Acute metabolic encephalopathy 76/73/4193   Acute on chronic combined systolic and diastolic CHF (congestive heart failure) ([place]) 07/26/2016   Troponin level elevated 07/26/2016   Chronic anticoagulation 07/22/2016   CAD S/P multiple PCIs-last one 2016 07/22/2016   NSTEMI (non-ST elevated myocardial infarction) ([place]) 07/22/2016   Primary osteoarthritis of right hip 06/27/2016   Avascular necrosis of hip, right ([place]) 06/27/2016   PVD (peripheral vascular disease) ([place])    Essential (primary) hypertension 01/14/2015   Coronary artery disease involving native coronary artery of native heart without angina pectoris 10/06/2014  Acute angina ([place]) 05/12/2014   Epigastric pain    Chest pain with high risk of acute coronary syndrome 10/21/2013   Acute respiratory failure ([place]) 09/20/2013   Nonspecific abnormal results of liver function study 09/17/2013   Cholecystitis, acute 09/15/2013   PAD (peripheral artery disease) (HCC)    COPD (chronic obstructive pulmonary disease) ([place])    Peripheral neuropathy    Chronic back pain    Sleep apnea    Anxiety    GERD (gastroesophageal reflux disease)    Nonunion, fracture 08/27/2013   Hypertension 07/20/2013   HLD (hyperlipidemia) 07/20/2013   Unstable angina ([place])  07/19/2013   Hx of CABG 07/19/2013   Past Medical History:  Diagnosis Date   Anemia    Anginal pain ([place])    jaw pain 07/2016, s/p 07/15/16 nuclear stress test   Anxiety    Arthritis    "lower back" (08/24/2017)   Asthma    [place] Obstructive Pulmonary Disease    Chronic back pain    Chronic combined systolic and diastolic CHF    EF 9/50: 93-26 // EF 4/21: 55-60 // EF 4/21: 20-25   Chronic leg pain    Chronic lower back pain    Coronary Artery Disease    a. s/p NSTEMI >> CABG 1998 // s/p PCI 2013 // [place] 01/2015: DES to SVG-RCA.// Cath 05/2019: all grafts occluded; mod non-obs LAD disease w L-L and L-R collats - med Rx // post Ao-Bifem bypass MI c/b PEA, CGS requiring Impella >> anatomy unchanged - Med Rx   Depression    Fall 08/2016   Gastroesophageal reflux disease    Glucose intolerance    Headache    "a few/month" (08/24/2017)   HLD (hyperlipidemia)    Hx of DVT X 3   RLE   Hypertension    Myocardial infarction ([place] [facility]) 1999   OSA on CPAP    Paroxysmal atrial fibrillation    a. 09/2013 post-op b. 01/2015 // Apixaban // recurrent AFib post MI after Ao-Bifem in 06/2019 >> [facility] Rx   Peripheral artery disease    s/p L fem-fem bypass // s/p Ao-Bifem bypass in 06/2019   Peripheral neuropathy    Pneumonia    Shortness of breath    when walking   Wears glasses     Family History  Problem Relation Age of Onset   Lung cancer Mother    Clotting disorder Father    Lung cancer Sister    Heart disease Brother    Heart disease Brother    Colon cancer Neg Hx    Breast cancer Neg Hx     Past Surgical History:  Procedure Laterality Date   ABDOMINAL AORTOGRAM W/LOWER EXTREMITY Bilateral 04/29/2019   Procedure: ABDOMINAL AORTOGRAM W/LOWER EXTREMITY;  Surgeon: Waynetta Sandy, MD;  Location: [facility];  Service: Cardiovascular;  Laterality: Bilateral;   ANTERIOR CERVICAL DECOMP/DISCECTOMY FUSION  1991   AORTA - BILATERAL FEMORAL ARTERY BYPASS GRAFT N/A 06/18/2019    Procedure: AORTA BIFEMORAL BYPASS GRAFT;  Surgeon: Waynetta Sandy, MD;  Location: [facility];  Service: Vascular;  Laterality: N/A;   ARTERY REPAIR Left 06/30/2019   Procedure: LEFT BRACHIAL ARTERY REPAIR, EVACUATION OF HEMATOMA;  Surgeon: Serafina Mitchell, MD;  Location: [facility];  Service: Vascular;  Laterality: Left;   BACK SURGERY     CARDIAC CATHETERIZATION N/A 01/22/2015   Procedure: Left Heart Cath and Coronary Angiography;  Surgeon: Troy Sine, MD;  Location: [facility];  Service:  Cardiovascular;  Laterality: N/A;   CARDIAC CATHETERIZATION N/A 01/22/2015   Procedure: Coronary Stent Intervention;  Surgeon: Troy Sine, MD;  Location: [facility];  Service: Cardiovascular;  Laterality: N/A;  3.0x12 xience prox SVG to RCA   CARDIAC CATHETERIZATION  03/09/2000   hx/notes 03/20/2000   CHOLECYSTECTOMY N/A 09/16/2013   Procedure: LAPAROSCOPIC CHOLECYSTECTOMY CONVERTED TO OPEN CHOLECYSTECTOMY WITH CHOLANGIOGRAM;  Surgeon: [name], MD;  Location: [facility];  Service: General;  Laterality: N/A;   CORONARY ANGIOGRAPHY N/A 06/27/2019   Procedure: CORONARY ANGIOGRAPHY (CATH LAB);  Surgeon: Sherren Mocha, MD;  Location: [facility];  Service: Cardiovascular;  Laterality: N/A;   CORONARY ANGIOPLASTY WITH STENT PLACEMENT  11/2011, 02/2012, 01/2015   DES to SVG-RCA both times, In [place], [place], Dr. Bruce Donath   CORONARY ARTERY BYPASS GRAFT  1998   LIMA-LAD, SVG-OM, SVG-RCA   ERCP N/A 09/19/2013   Procedure: ENDOSCOPIC RETROGRADE CHOLANGIOPANCREATOGRAPHY (ERCP);  Surgeon: [name], MD;  Location: [facility];  Service: Endoscopy;  Laterality: N/A;   FINGER SURGERY Right    ring finger "fused"   FRACTURE SURGERY     IRRIGATION AND DEBRIDEMENT ABSCESS Right 10/17/2013   Procedure: INCISION AND DRAINAGE RIGHT SUBCOSTAL WOUND;  Surgeon: [name], MD;  Location: [facility];  Service: General;  Laterality: Right;   JOINT REPLACEMENT     KNEE ARTHROSCOPY Right 1998    LEFT HEART CATH AND CORS/GRAFTS ANGIOGRAPHY N/A 07/25/2016   Procedure: Left Heart Cath and Cors/Grafts Angiography;  Surgeon: Jettie Booze, MD;  Location: [facility];  Service: Cardiovascular;  Laterality: N/A;   LEFT HEART CATH AND CORS/GRAFTS ANGIOGRAPHY N/A 06/05/2019   Procedure: LEFT HEART CATH AND CORS/GRAFTS ANGIOGRAPHY;  Surgeon: Burnell Blanks, MD;  Location: [facility];  Service: Cardiovascular;  Laterality: N/A;   LEFT HEART CATHETERIZATION WITH CORONARY/GRAFT ANGIOGRAM N/A 07/19/2013   Procedure: LEFT HEART CATHETERIZATION WITH Beatrix Fetters;  Surgeon: Leonie Man, MD;  Location: [facility];  Service: Cardiovascular;  Laterality: N/A;   MASS EXCISION Right 06/25/2014   Procedure: EXCISION BUTTOCK MASS ;  Surgeon: Coralie Keens, MD;  Location: [facility];  Service: General;  Laterality: Right;   ORIF HUMERUS FRACTURE Left 08/27/2013   Procedure: OPEN REDUCTION INTERNAL FIXATION (ORIF) LEFT HUMERUS ;  Surgeon: Rozanna Box, MD;  Location: [facility];  Service: Orthopedics;  Laterality: Left;   PLACEMENT OF IMPELLA LEFT VENTRICULAR ASSIST DEVICE N/A 06/24/2019   Procedure: PLACEMENT OF IMPELLA LEFT VENTRICULAR ASSIST DEVICE;  Surgeon: Wonda Olds, MD;  Location: [facility];  Service: Open Heart Surgery;  Laterality: N/A;   POSTERIOR FUSION CERVICAL SPINE  1992   REMOVAL OF IMPELLA LEFT VENTRICULAR ASSIST DEVICE Right 06/28/2019   Procedure: REMOVAL OF IMPELLA LEFT VENTRICULAR ASSIST DEVICE;  Surgeon: Wonda Olds, MD;  Location: [facility];  Service: Open Heart Surgery;  Laterality: Right;   THROMBECTOMY / EMBOLECTOMY FEMORAL ARTERY Right    hx/notes 03/20/2000   TOTAL HIP ARTHROPLASTY Right 07/19/2016   Procedure: TOTAL HIP ARTHROPLASTY ANTERIOR APPROACH;  Surgeon: [name], MD;  Location: [facility];  Service: Orthopedics;  Laterality: Right;   TOTAL HIP REVISION Right 08/22/2017   Procedure: TOTAL HIP REVISION;  Surgeon:  [name], MD;  Location: [facility];  Service: Orthopedics;  Laterality: Right;   Social History   Occupational History   Occupation: Disabled  Tobacco Use   Smoking status: Former    Packs/day: 0.50  Years: 34.00    Pack years: 17.00    Types: Cigarettes    Quit date: 07/20/1998    Years since quitting: 22.1   Smokeless tobacco: Never  Vaping Use   Vaping Use: Never used  Substance and Sexual Activity   Alcohol use: Not Currently   Drug use: Yes    Types: Marijuana   Sexual activity: Not Currently

## 2020-09-28 DIAGNOSIS — I25810 Atherosclerosis of coronary artery bypass graft(s) without angina pectoris: Secondary | ICD-10-CM | POA: Diagnosis not present

## 2020-09-28 DIAGNOSIS — I10 Essential (primary) hypertension: Secondary | ICD-10-CM | POA: Diagnosis not present

## 2020-09-28 DIAGNOSIS — I480 Paroxysmal atrial fibrillation: Secondary | ICD-10-CM | POA: Diagnosis not present

## 2020-09-28 DIAGNOSIS — E785 Hyperlipidemia, unspecified: Secondary | ICD-10-CM | POA: Diagnosis not present

## 2020-09-28 DIAGNOSIS — I509 Heart failure, unspecified: Secondary | ICD-10-CM | POA: Diagnosis not present

## 2020-09-28 DIAGNOSIS — J449 Chronic obstructive pulmonary disease, unspecified: Secondary | ICD-10-CM | POA: Diagnosis not present

## 2020-10-06 DIAGNOSIS — R1030 Lower abdominal pain, unspecified: Secondary | ICD-10-CM | POA: Diagnosis not present

## 2020-10-06 DIAGNOSIS — M549 Dorsalgia, unspecified: Secondary | ICD-10-CM | POA: Diagnosis not present

## 2020-10-13 ENCOUNTER — Other Ambulatory Visit: Payer: Self-pay | Admitting: Internal Medicine

## 2020-10-13 ENCOUNTER — Other Ambulatory Visit (HOSPITAL_COMMUNITY): Payer: Self-pay | Admitting: Internal Medicine

## 2020-10-13 DIAGNOSIS — R1032 Left lower quadrant pain: Secondary | ICD-10-CM

## 2020-10-13 DIAGNOSIS — M5416 Radiculopathy, lumbar region: Secondary | ICD-10-CM

## 2020-10-22 ENCOUNTER — Other Ambulatory Visit: Payer: Self-pay

## 2020-10-22 ENCOUNTER — Ambulatory Visit (HOSPITAL_COMMUNITY)
Admission: RE | Admit: 2020-10-22 | Discharge: 2020-10-22 | Disposition: A | Discharge status: Home / Self Care | Payer: Medicare Other | Source: Ambulatory Visit | Attending: Internal Medicine | Admitting: Internal Medicine

## 2020-10-22 DIAGNOSIS — M5416 Radiculopathy, lumbar region: Secondary | ICD-10-CM | POA: Insufficient documentation

## 2020-10-22 DIAGNOSIS — R1032 Left lower quadrant pain: Secondary | ICD-10-CM | POA: Insufficient documentation

## 2020-10-22 DIAGNOSIS — J340 Abscess, furuncle and carbuncle of nose: Secondary | ICD-10-CM | POA: Diagnosis not present

## 2020-10-22 DIAGNOSIS — M545 Low back pain, unspecified: Secondary | ICD-10-CM | POA: Diagnosis not present

## 2020-11-11 DIAGNOSIS — E785 Hyperlipidemia, unspecified: Secondary | ICD-10-CM | POA: Diagnosis not present

## 2020-11-11 DIAGNOSIS — I480 Paroxysmal atrial fibrillation: Secondary | ICD-10-CM | POA: Diagnosis not present

## 2020-11-11 DIAGNOSIS — I25810 Atherosclerosis of coronary artery bypass graft(s) without angina pectoris: Secondary | ICD-10-CM | POA: Diagnosis not present

## 2020-11-11 DIAGNOSIS — J449 Chronic obstructive pulmonary disease, unspecified: Secondary | ICD-10-CM | POA: Diagnosis not present

## 2020-11-11 DIAGNOSIS — I509 Heart failure, unspecified: Secondary | ICD-10-CM | POA: Diagnosis not present

## 2020-11-11 DIAGNOSIS — I10 Essential (primary) hypertension: Secondary | ICD-10-CM | POA: Diagnosis not present

## 2020-11-16 ENCOUNTER — Emergency Department (HOSPITAL_COMMUNITY)
Admission: EM | Admit: 2020-11-16 | Discharge: 2020-11-17 | Disposition: A | Discharge status: Home / Self Care | Payer: Medicare Other | Attending: Emergency Medicine | Admitting: Emergency Medicine

## 2020-11-16 ENCOUNTER — Other Ambulatory Visit: Payer: Self-pay

## 2020-11-16 DIAGNOSIS — Z79899 Other long term (current) drug therapy: Secondary | ICD-10-CM | POA: Insufficient documentation

## 2020-11-16 DIAGNOSIS — Z87891 Personal history of nicotine dependence: Secondary | ICD-10-CM | POA: Diagnosis not present

## 2020-11-16 DIAGNOSIS — Z96641 Presence of right artificial hip joint: Secondary | ICD-10-CM | POA: Insufficient documentation

## 2020-11-16 DIAGNOSIS — I5042 Chronic combined systolic (congestive) and diastolic (congestive) heart failure: Secondary | ICD-10-CM | POA: Diagnosis not present

## 2020-11-16 DIAGNOSIS — I25110 Atherosclerotic heart disease of native coronary artery with unstable angina pectoris: Secondary | ICD-10-CM | POA: Diagnosis not present

## 2020-11-16 DIAGNOSIS — Z7982 Long term (current) use of aspirin: Secondary | ICD-10-CM | POA: Diagnosis not present

## 2020-11-16 DIAGNOSIS — M5432 Sciatica, left side: Secondary | ICD-10-CM

## 2020-11-16 DIAGNOSIS — R200 Anesthesia of skin: Secondary | ICD-10-CM | POA: Insufficient documentation

## 2020-11-16 DIAGNOSIS — Z951 Presence of aortocoronary bypass graft: Secondary | ICD-10-CM | POA: Insufficient documentation

## 2020-11-16 DIAGNOSIS — I110 Hypertensive heart disease with heart failure: Secondary | ICD-10-CM | POA: Diagnosis not present

## 2020-11-16 DIAGNOSIS — Z7901 Long term (current) use of anticoagulants: Secondary | ICD-10-CM | POA: Insufficient documentation

## 2020-11-16 DIAGNOSIS — M5442 Lumbago with sciatica, left side: Secondary | ICD-10-CM | POA: Diagnosis not present

## 2020-11-16 DIAGNOSIS — E1140 Type 2 diabetes mellitus with diabetic neuropathy, unspecified: Secondary | ICD-10-CM | POA: Insufficient documentation

## 2020-11-16 DIAGNOSIS — Z743 Need for continuous supervision: Secondary | ICD-10-CM | POA: Diagnosis not present

## 2020-11-16 DIAGNOSIS — J45909 Unspecified asthma, uncomplicated: Secondary | ICD-10-CM | POA: Diagnosis not present

## 2020-11-16 DIAGNOSIS — M5416 Radiculopathy, lumbar region: Secondary | ICD-10-CM | POA: Diagnosis not present

## 2020-11-16 DIAGNOSIS — J449 Chronic obstructive pulmonary disease, unspecified: Secondary | ICD-10-CM | POA: Diagnosis not present

## 2020-11-16 DIAGNOSIS — M545 Low back pain, unspecified: Secondary | ICD-10-CM | POA: Diagnosis present

## 2020-11-16 DIAGNOSIS — R6889 Other general symptoms and signs: Secondary | ICD-10-CM | POA: Diagnosis not present

## 2020-11-16 NOTE — ED Triage Notes (Signed)
Pt states that she has been having this pain for over a month, was seen by PCP and MRI showed bulged disc.  Pt states pain is mostly on her left back and leg down to shin which feels numb  Pulses present bilaterally and CMS intact

## 2020-11-16 NOTE — ED Notes (Signed)
Suanne Marker sister [phone] would like a call when patient is in a room

## 2020-11-16 NOTE — ED Notes (Signed)
Pt called for vitals. No response.  

## 2020-11-16 NOTE — ED Triage Notes (Signed)
Pt arrived by EMS from home c/o lower back pain that radiated bilaterally to legs.  Was seen by provider for same complaint but not diagnosed.  Hx of blood clots, Is on blood thinner.   Pt was ambulatory from home to stretcher.

## 2020-11-16 NOTE — ED Provider Notes (Signed)
Emergency Medicine Provider Triage Evaluation Note  Wendy [name] , a 70 y.o. female  was evaluated in triage.  Pt complains of lower back pain.  Patient with history of chronic lower back pain on opioids.  She has had worsening left lower back pain with shooting pains down her left leg over the past 2 weeks.  She was seen at [facility] long about 3 to 4 weeks ago for similar problems where she would got an MRI that showed some disc abnormalities.  She followed up with her PCP.  The pain continued to get worse.  Her oxycodone does not help the pain.  She does have associated decreased sensation of left leg.  She is no saddle anesthesia, fevers, chills, bilateral symptoms, urinary or bowel incontinence.  Review of Systems  Positive: Back pain with sciatica, decreased sensation of left leg Negative: Fever, chills, urinary or bowel incontinence, saddle anesthesia  Physical Exam  BP (!) 129/59 (BP Location: Right Arm)   Pulse (!) 56   Temp 98.1 F (36.7 C) (Oral)   Resp 16   Ht '5\' 1"'$  (1.549 m)   Wt 68 kg   SpO2 98%   BMI 28.34 kg/m  Gen:   Awake, no distress  Resp:  Normal effort  MSK:   Moves extremities without difficulty  Other:  No lumbar midline tenderness.  Paraspinal tenderness left of lumbar spine.  Decreased sensation to left lower extremity.  Distal pulses intact.  No swelling of lower extremities.  Medical Decision Making  Medically screening exam initiated at 2:14 PM.  Appropriate orders placed.  Wendy [name] was informed that the remainder of the evaluation will be completed by another provider, this initial triage assessment does not replace that evaluation, and the importance of remaining in the ED until their evaluation is complete.    Adolphus Birchwood, PA-C 11/16/20 1418    Jeanell Sparrow, DO 11/16/20 1752

## 2020-11-17 DIAGNOSIS — M5442 Lumbago with sciatica, left side: Secondary | ICD-10-CM | POA: Diagnosis not present

## 2020-11-17 DIAGNOSIS — M5416 Radiculopathy, lumbar region: Secondary | ICD-10-CM | POA: Diagnosis not present

## 2020-11-17 MED ORDER — HYDROMORPHONE HCL 1 MG/ML IJ SOLN
1.0000 mg | Freq: Once | INTRAMUSCULAR | Status: AC
  Administered 2020-11-17: 1 mg via INTRAMUSCULAR
  Filled 2020-11-17: qty 1

## 2020-11-17 MED ORDER — PREDNISONE 20 MG PO TABS
60.0000 mg | ORAL_TABLET | Freq: Once | ORAL | Status: AC
  Administered 2020-11-17: 60 mg via ORAL
  Filled 2020-11-17: qty 3

## 2020-11-17 NOTE — Discharge Instructions (Addendum)
Continue your regular medications. [facility] will call to schedule an appointment. If, on the outside chance you do not hear from them, call to schedule an appointment.

## 2020-11-17 NOTE — ED Notes (Signed)
Pts sister contacted for pick-up

## 2020-11-17 NOTE — ED Provider Notes (Signed)
[facility] EMERGENCY DEPARTMENT Provider Note   CSN: [id number] Arrival date & time: 11/16/20  1254     History Chief Complaint  Patient presents with   Back Pain   Leg Pain    Wendy [name] is a 70 y.o. female.  Patient to ED with complaint of severe low back to left LE pain. She has chronic pain but reports it is felt to be worse and not controlled with her regularly prescribed oxycodone. She reports having an MRI evaluation recently ordered by her PCP, Dr. Mertha Finders, showing multi-level disc protrusions, foraminal stenosis and nerve impingement. She has tried muscle relaxers and gabapentin with adverse reactions. No recent falls. No new symptoms, just subjective worsening, intense burning type pain in the left lower extremity. No urinary or bowel incontinence.   The history is provided by the patient. No language interpreter was used.  Back Pain Associated symptoms: leg pain and numbness   Associated symptoms: no fever and no weakness   Leg Pain Associated symptoms: back pain   Associated symptoms: no fever       Past Medical History:  Diagnosis Date   Anemia    Anginal pain ([place])    jaw pain 07/2016, s/p 07/15/16 nuclear stress test   Anxiety    Arthritis    "lower back" (08/24/2017)   Asthma    [place] Obstructive Pulmonary Disease    Chronic back pain    Chronic combined systolic and diastolic CHF    EF 0000000: [id number] // EF 4/21: 55-60 // EF 4/21: 20-25   Chronic leg pain    Chronic lower back pain    Coronary Artery Disease    a. s/p NSTEMI >> CABG 1998 // s/p PCI 2013 // [place] 01/2015: DES to SVG-RCA.// Cath 05/2019: all grafts occluded; mod non-obs LAD disease w L-L and L-R collats - med Rx // post Ao-Bifem bypass MI c/b PEA, CGS requiring Impella >> anatomy unchanged - Med Rx   Depression    Fall 08/2016   Gastroesophageal reflux disease    Glucose intolerance    Headache    "a few/month" (08/24/2017)   HLD (hyperlipidemia)    Hx of DVT X  3   RLE   Hypertension    Myocardial infarction ([place]) 1999   OSA on CPAP    Paroxysmal atrial fibrillation    a. 09/2013 post-op b. 01/2015 // Apixaban // recurrent AFib post MI after Ao-Bifem in 06/2019 >> Amio Rx   Peripheral artery disease    s/p L fem-fem bypass // s/p Ao-Bifem bypass in 06/2019   Peripheral neuropathy    Pneumonia    Shortness of breath    when walking   Wears glasses     Patient Active Problem List   Diagnosis Date Noted   Drug overdose 04/10/2020   AKI (acute kidney injury) ([place]) 09/20/2019   Syncope 09/19/2019   Prolonged QT interval 09/10/2019   Acute respiratory failure with hypoxia ([place]) 09/10/2019   Hypokalemia 09/10/2019   Hypomagnesemia 09/10/2019   Hematoma 07/21/2019   Cardiogenic shock (HCC)    Aortoiliac occlusive disease ([place]) 06/18/2019   Coronary artery disease of native artery of native heart with stable angina pectoris ([place])    Infection of right prosthetic hip joint ([place]) [id number]   Acute metabolic encephalopathy [id number]   Acute on chronic combined systolic and diastolic CHF (congestive heart failure) ([place]) 07/26/2016   Troponin level elevated 07/26/2016   Chronic anticoagulation 07/22/2016   CAD S/P  multiple PCIs-last one 2016 07/22/2016   NSTEMI (non-ST elevated myocardial infarction) ([place]) 07/22/2016   Primary osteoarthritis of right hip 06/27/2016   Avascular necrosis of hip, right ([place]) 06/27/2016   PVD (peripheral vascular disease) ([place])    Essential (primary) hypertension 01/14/2015   Coronary artery disease involving native coronary artery of native heart without angina pectoris 10/06/2014   Acute angina ([place]) 05/12/2014   Epigastric pain    Chest pain with high risk of acute coronary syndrome 10/21/2013   Acute respiratory failure ([place]) 09/20/2013   Nonspecific abnormal results of liver function study 09/17/2013   Cholecystitis, acute 09/15/2013   PAD (peripheral artery disease) (HCC)    COPD (chronic obstructive  pulmonary disease) ([place])    Peripheral neuropathy    Chronic back pain    Sleep apnea    Anxiety    GERD (gastroesophageal reflux disease)    Nonunion, fracture 08/27/2013   Hypertension 07/20/2013   HLD (hyperlipidemia) 07/20/2013   Unstable angina ([place]) 07/19/2013   Hx of CABG 07/19/2013    Past Surgical History:  Procedure Laterality Date   ABDOMINAL AORTOGRAM W/LOWER EXTREMITY Bilateral 04/29/2019   Procedure: ABDOMINAL AORTOGRAM W/LOWER EXTREMITY;  Surgeon: Waynetta Sandy, MD;  Location: [place] [facility];  Service: Cardiovascular;  Laterality: Bilateral;   ANTERIOR CERVICAL DECOMP/DISCECTOMY FUSION  1991   AORTA - BILATERAL FEMORAL ARTERY BYPASS GRAFT N/A 06/18/2019   Procedure: AORTA BIFEMORAL BYPASS GRAFT;  Surgeon: Waynetta Sandy, MD;  Location: [place];  Service: Vascular;  Laterality: N/A;   ARTERY REPAIR Left 06/30/2019   Procedure: LEFT BRACHIAL ARTERY REPAIR, EVACUATION OF HEMATOMA;  Surgeon: Serafina Mitchell, MD;  Location: [place];  Service: Vascular;  Laterality: Left;   BACK SURGERY     CARDIAC CATHETERIZATION N/A 01/22/2015   Procedure: Left Heart Cath and Coronary Angiography;  Surgeon: Troy Sine, MD;  Location: [place] [facility];  Service: Cardiovascular;  Laterality: N/A;   CARDIAC CATHETERIZATION N/A 01/22/2015   Procedure: Coronary Stent Intervention;  Surgeon: Troy Sine, MD;  Location: [place];  Service: Cardiovascular;  Laterality: N/A;  3.0x12 xience prox SVG to RCA   CARDIAC CATHETERIZATION  03/09/2000   hx/notes 03/20/2000   CHOLECYSTECTOMY N/A 09/16/2013   Procedure: LAPAROSCOPIC CHOLECYSTECTOMY CONVERTED TO OPEN CHOLECYSTECTOMY WITH CHOLANGIOGRAM;  Surgeon: [name], MD;  Location: [place];  Service: General;  Laterality: N/A;   CORONARY ANGIOGRAPHY N/A 06/27/2019   Procedure: CORONARY ANGIOGRAPHY (CATH LAB);  Surgeon: Sherren Mocha, MD;  Location: [place];  Service: Cardiovascular;  Laterality:  N/A;   CORONARY ANGIOPLASTY WITH STENT PLACEMENT  11/2011, 02/2012, 01/2015   DES to SVG-RCA both times, In [place], [place], Dr. Bruce Donath   CORONARY ARTERY BYPASS GRAFT  1998   LIMA-LAD, SVG-OM, SVG-RCA   ERCP N/A 09/19/2013   Procedure: ENDOSCOPIC RETROGRADE CHOLANGIOPANCREATOGRAPHY (ERCP);  Surgeon: [name], MD;  Location: [place];  Service: Endoscopy;  Laterality: N/A;   FINGER SURGERY Right    ring finger "fused"   FRACTURE SURGERY     IRRIGATION AND DEBRIDEMENT ABSCESS Right 10/17/2013   Procedure: INCISION AND DRAINAGE RIGHT SUBCOSTAL WOUND;  Surgeon: [name], MD;  Location: [facility];  Service: General;  Laterality: Right;   JOINT REPLACEMENT     KNEE ARTHROSCOPY Right 1998   LEFT HEART CATH AND CORS/GRAFTS ANGIOGRAPHY N/A 07/25/2016   Procedure: Left Heart Cath and Cors/Grafts Angiography;  Surgeon: Jettie Booze, MD;  Location: [facility];  Service: Cardiovascular;  Laterality: N/A;   LEFT HEART CATH AND CORS/GRAFTS ANGIOGRAPHY N/A 06/05/2019   Procedure: LEFT HEART CATH AND CORS/GRAFTS ANGIOGRAPHY;  Surgeon: Burnell Blanks, MD;  Location: [place];  Service: Cardiovascular;  Laterality: N/A;   LEFT HEART CATHETERIZATION WITH CORONARY/GRAFT ANGIOGRAM N/A 07/19/2013   Procedure: LEFT HEART CATHETERIZATION WITH Beatrix Fetters;  Surgeon: Leonie Man, MD;  Location: [facility];  Service: Cardiovascular;  Laterality: N/A;   MASS EXCISION Right 06/25/2014   Procedure: EXCISION BUTTOCK MASS ;  Surgeon: Coralie Keens, MD;  Location: [place];  Service: General;  Laterality: Right;   ORIF HUMERUS FRACTURE Left 08/27/2013   Procedure: OPEN REDUCTION INTERNAL FIXATION (ORIF) LEFT HUMERUS ;  Surgeon: Rozanna Box, MD;  Location: [place];  Service: Orthopedics;  Laterality: Left;   PLACEMENT OF IMPELLA LEFT VENTRICULAR ASSIST DEVICE N/A 06/24/2019   Procedure: PLACEMENT OF IMPELLA LEFT VENTRICULAR ASSIST DEVICE;   Surgeon: Wonda Olds, MD;  Location: [place];  Service: Open Heart Surgery;  Laterality: N/A;   POSTERIOR FUSION CERVICAL SPINE  1992   REMOVAL OF IMPELLA LEFT VENTRICULAR ASSIST DEVICE Right 06/28/2019   Procedure: REMOVAL OF IMPELLA LEFT VENTRICULAR ASSIST DEVICE;  Surgeon: Wonda Olds, MD;  Location: [place];  Service: Open Heart Surgery;  Laterality: Right;   THROMBECTOMY / EMBOLECTOMY FEMORAL ARTERY Right    hx/notes 03/20/2000   TOTAL HIP ARTHROPLASTY Right 07/19/2016   Procedure: TOTAL HIP ARTHROPLASTY ANTERIOR APPROACH;  Surgeon: [name], MD;  Location: [place];  Service: Orthopedics;  Laterality: Right;   TOTAL HIP REVISION Right 08/22/2017   Procedure: TOTAL HIP REVISION;  Surgeon: [name], MD;  Location: [place];  Service: Orthopedics;  Laterality: Right;     OB History   No obstetric history on file.     Family History  Problem Relation Age of Onset   Lung cancer Mother    Clotting disorder Father    Lung cancer Sister    Heart disease Brother    Heart disease Brother    Colon cancer Neg Hx    Breast cancer Neg Hx     Social History   Tobacco Use   Smoking status: Former    Packs/day: 0.50    Years: 34.00    Pack years: 17.00    Types: Cigarettes    Quit date: 07/20/1998    Years since quitting: 22.3   Smokeless tobacco: Never  Vaping Use   Vaping Use: Never used  Substance Use Topics   Alcohol use: Not Currently   Drug use: Yes    Types: Marijuana    Home Medications Prior to Admission medications   Medication Sig Start Date End Date Taking? Authorizing Provider  acetaminophen (TYLENOL) 500 MG tablet Take 500-1,000 mg by mouth every 6 (six) hours as needed (for pain.).    [provider]  albuterol (PROVENTIL HFA;VENTOLIN HFA) 108 (90 Base) MCG/ACT inhaler Inhale 2 puffs into the lungs every 6 (six) hours as needed for wheezing or shortness of breath. 01/01/18   Bhagat, Crista Luria, PA  albuterol (PROVENTIL) (2.5 MG/3ML)  0.083% nebulizer solution Inhale 2.5 mg into the lungs 3 (three) times daily as needed for shortness of breath or wheezing. 03/20/20   [provider]  ALPRAZolam Duanne Moron) 0.5 MG tablet Take 0.5 mg by mouth 3 (three) times daily as needed for anxiety.    [provider]  amiodarone (PACERONE) 100 MG tablet Take 1 tablet (100  mg total) by mouth every morning. 08/24/20   Loel Dubonnet, NP  apixaban (ELIQUIS) 5 MG TABS tablet Take 1 tablet (5 mg total) by mouth 2 (two) times daily. 11/20/18   Nahser, Wonda Cheng, MD  aspirin 81 MG chewable tablet Chew 1 tablet (81 mg total) by mouth daily. 07/10/19   Dagoberto Ligas, PA-C  atorvastatin (LIPITOR) 80 MG tablet TAKE ONE TABLET BY MOUTH ONCE DAILY 09/14/20   Nahser, Wonda Cheng, MD  buPROPion (WELLBUTRIN XL) 150 MG 24 hr tablet Take 150 mg by mouth daily.    [provider]  diclofenac Sodium (VOLTAREN) 1 % GEL Apply 1 g topically 4 (four) times daily as needed for pain. 02/15/19   [provider]  docusate sodium (COLACE) 100 MG capsule Take 100 mg by mouth daily as needed for mild constipation.    [provider]  ENTRESTO 24-26 MG Take 1 tablet by mouth 2 times daily. 09/23/20   Nahser, Wonda Cheng, MD  ezetimibe (ZETIA) 10 MG tablet Take 10 mg by mouth daily.    [provider]  furosemide (LASIX) 20 MG tablet Take 20 mg by mouth as needed for fluid or edema.    [provider]  gabapentin (NEURONTIN) 300 MG capsule Take 300 mg by mouth 3 (three) times daily. 02/15/19   [provider]  melatonin 5 MG TABS Take 5 mg by mouth at bedtime as needed (sleep).    [provider]  nitroGLYCERIN (NITROSTAT) 0.4 MG SL tablet Place 1 tablet (0.4 mg total) under the tongue every 5 (five) minutes as needed for chest pain. 11/20/18   Nahser, Wonda Cheng, MD  oxyCODONE 10 MG TABS Take 1 tablet (10 mg total) by mouth 3 (three) times daily. Patient taking differently: Take 10 mg by mouth 4 (four) times  daily as needed (pain). 07/12/19   Ulyses Amor, PA-C  pantoprazole (PROTONIX) 40 MG tablet TAKE ONE TABLET BY MOUTH EVERY MORNING Patient taking differently: Take 40 mg by mouth daily. 03/30/20   Nahser, Wonda Cheng, MD  pramipexole (MIRAPEX) 0.25 MG tablet Take 0.25 mg by mouth at bedtime.  02/18/19   [provider]  promethazine (PHENERGAN) 12.5 MG tablet TAKE ONE TABLET BY MOUTH EVERY 6 HOURS AS NEEDED FOR NAUSEA AND VOMITING Patient taking differently: Take 12.5 mg by mouth every 6 (six) hours. 11/06/19   Waynetta Sandy, MD  ranolazine (RANEXA) 1000 MG SR tablet Take 1,000 mg by mouth 2 (two) times daily.    [provider]  sertraline (ZOLOFT) 100 MG tablet Take 150 mg by mouth at bedtime.     [provider]    Allergies    Benadryl [diphenhydramine], Penicillins, Adhesive [tape], Amoxicillin, Hydrocodone-acetaminophen, Cyclobenzaprine, and Hydrocodone  Review of Systems   Review of Systems  Constitutional:  Negative for chills and fever.  Respiratory: Negative.    Cardiovascular: Negative.   Gastrointestinal: Negative.   Musculoskeletal:  Positive for back pain.       See HPI.  Skin: Negative.   Neurological:  Positive for numbness. Negative for weakness.   Physical Exam Updated Vital Signs BP 138/72 (BP Location: Left Arm)   Pulse 63   Temp 98.9 F (37.2 C) (Oral)   Resp 17   Ht '5\' 1"'$  (1.549 m)   Wt 68 kg   SpO2 100%   BMI 28.34 kg/m   Physical Exam Vitals and nursing note reviewed.  Constitutional:      General: She is not in  acute distress.    Appearance: She is well-developed. She is not ill-appearing.  Pulmonary:     Effort: Pulmonary effort is normal.  Abdominal:     General: There is no distension.     Palpations: Abdomen is soft.     Tenderness: There is no abdominal tenderness.  Musculoskeletal:        General: Normal range of motion.     Cervical back: Normal range of motion.     Comments: Full ROM LE's without  strength deficits. Pulses present in bilateral LE's and symmetric. No muscle atrophy. No edema or discoloration.   Skin:    General: Skin is warm and dry.  Neurological:     Mental Status: She is alert and oriented to person, place, and time.     Comments: DTR's normoreflexic bilateral LE's. Slight decrease in sensation to light touch on lateral distal left thigh.     ED Results / Procedures / Treatments   Labs (all labs ordered are listed, but only abnormal results are displayed) Labs Reviewed - No data to display  EKG None  Radiology No results found.  Procedures Procedures   Medications Ordered in ED Medications - No data to display  ED Course  I have reviewed the triage vital signs and the nursing notes.  Pertinent labs & imaging results that were available during my care of the patient were reviewed by me and considered in my medical decision making (see chart for details).    MDM Rules/Calculators/A&P                           Patient to ED with worsening of chronic symptoms of back pain.   Recent MRI reviewed from 8/18 showing multi-level disc protrusions, canal stenosis and nerve impingement. No neuro deficits on exam. She has been seen by Dr. Christy Gentles.   Will attempt to offer pain relief here. Consider steroid taper dose on discharge, ambulatory referral to neurosurgery.   Pain improved with IM Dilaudid. Will discharge as discussed and provide ambulatory referral to [facility].  Final Clinical Impression(s) / ED Diagnoses Final diagnoses:  None   Chronic back pain Lumbar radiculopathy  Rx / DC Orders ED Discharge Orders     None        Charlann Lange, PA-C 11/17/20 0603    Ripley Fraise, MD 11/18/20 [phone]

## 2020-12-01 DIAGNOSIS — I509 Heart failure, unspecified: Secondary | ICD-10-CM | POA: Diagnosis not present

## 2020-12-02 DIAGNOSIS — I10 Essential (primary) hypertension: Secondary | ICD-10-CM | POA: Diagnosis not present

## 2020-12-02 DIAGNOSIS — Z23 Encounter for immunization: Secondary | ICD-10-CM | POA: Diagnosis not present

## 2020-12-02 DIAGNOSIS — I480 Paroxysmal atrial fibrillation: Secondary | ICD-10-CM | POA: Diagnosis not present

## 2020-12-02 DIAGNOSIS — J449 Chronic obstructive pulmonary disease, unspecified: Secondary | ICD-10-CM | POA: Diagnosis not present

## 2020-12-02 DIAGNOSIS — E785 Hyperlipidemia, unspecified: Secondary | ICD-10-CM | POA: Diagnosis not present

## 2020-12-02 DIAGNOSIS — M549 Dorsalgia, unspecified: Secondary | ICD-10-CM | POA: Diagnosis not present

## 2020-12-02 DIAGNOSIS — I25810 Atherosclerosis of coronary artery bypass graft(s) without angina pectoris: Secondary | ICD-10-CM | POA: Diagnosis not present

## 2020-12-02 DIAGNOSIS — R1030 Lower abdominal pain, unspecified: Secondary | ICD-10-CM | POA: Diagnosis not present

## 2020-12-02 DIAGNOSIS — I509 Heart failure, unspecified: Secondary | ICD-10-CM | POA: Diagnosis not present

## 2020-12-09 ENCOUNTER — Other Ambulatory Visit: Payer: Self-pay | Admitting: Cardiovascular Disease

## 2020-12-12 ENCOUNTER — Other Ambulatory Visit: Payer: Self-pay | Admitting: Cardiovascular Disease

## 2020-12-16 DIAGNOSIS — M47816 Spondylosis without myelopathy or radiculopathy, lumbar region: Secondary | ICD-10-CM | POA: Diagnosis not present

## 2020-12-16 DIAGNOSIS — I509 Heart failure, unspecified: Secondary | ICD-10-CM | POA: Diagnosis not present

## 2020-12-16 DIAGNOSIS — I25810 Atherosclerosis of coronary artery bypass graft(s) without angina pectoris: Secondary | ICD-10-CM | POA: Diagnosis not present

## 2020-12-16 DIAGNOSIS — E785 Hyperlipidemia, unspecified: Secondary | ICD-10-CM | POA: Diagnosis not present

## 2020-12-16 DIAGNOSIS — I480 Paroxysmal atrial fibrillation: Secondary | ICD-10-CM | POA: Diagnosis not present

## 2020-12-16 DIAGNOSIS — J449 Chronic obstructive pulmonary disease, unspecified: Secondary | ICD-10-CM | POA: Diagnosis not present

## 2020-12-16 DIAGNOSIS — I10 Essential (primary) hypertension: Secondary | ICD-10-CM | POA: Diagnosis not present

## 2020-12-31 DIAGNOSIS — I509 Heart failure, unspecified: Secondary | ICD-10-CM | POA: Diagnosis not present

## 2021-01-24 ENCOUNTER — Other Ambulatory Visit: Payer: Self-pay

## 2021-01-24 DIAGNOSIS — I6529 Occlusion and stenosis of unspecified carotid artery: Secondary | ICD-10-CM

## 2021-01-24 DIAGNOSIS — I739 Peripheral vascular disease, unspecified: Secondary | ICD-10-CM

## 2021-01-31 DIAGNOSIS — I509 Heart failure, unspecified: Secondary | ICD-10-CM | POA: Diagnosis not present

## 2021-02-08 ENCOUNTER — Encounter: Payer: Self-pay | Admitting: Cardiovascular Disease

## 2021-02-08 NOTE — Progress Notes (Signed)
This encounter was created in error - please disregard.

## 2021-02-09 ENCOUNTER — Encounter: Payer: Medicare Other | Admitting: Cardiovascular Disease

## 2021-02-23 ENCOUNTER — Ambulatory Visit (INDEPENDENT_AMBULATORY_CARE_PROVIDER_SITE_OTHER)
Admission: RE | Admit: 2021-02-23 | Discharge: 2021-02-23 | Disposition: A | Discharge status: Home / Self Care | Payer: Medicare Other | Source: Ambulatory Visit | Attending: Physician Assistant | Admitting: Physician Assistant

## 2021-02-23 ENCOUNTER — Other Ambulatory Visit: Payer: Self-pay

## 2021-02-23 ENCOUNTER — Ambulatory Visit (HOSPITAL_COMMUNITY)
Admission: RE | Admit: 2021-02-23 | Discharge: 2021-02-23 | Disposition: A | Discharge status: Home / Self Care | Payer: Medicare Other | Source: Ambulatory Visit | Attending: Physician Assistant | Admitting: Physician Assistant

## 2021-02-23 ENCOUNTER — Ambulatory Visit: Payer: Medicare Other

## 2021-02-23 DIAGNOSIS — I739 Peripheral vascular disease, unspecified: Secondary | ICD-10-CM | POA: Insufficient documentation

## 2021-02-23 DIAGNOSIS — I6529 Occlusion and stenosis of unspecified carotid artery: Secondary | ICD-10-CM | POA: Diagnosis not present

## 2021-02-24 ENCOUNTER — Ambulatory Visit (INDEPENDENT_AMBULATORY_CARE_PROVIDER_SITE_OTHER): Payer: Medicare Other | Admitting: Physician Assistant

## 2021-02-24 ENCOUNTER — Encounter: Payer: Self-pay | Admitting: Physician Assistant

## 2021-02-24 DIAGNOSIS — M47817 Spondylosis without myelopathy or radiculopathy, lumbosacral region: Secondary | ICD-10-CM | POA: Insufficient documentation

## 2021-02-24 DIAGNOSIS — I739 Peripheral vascular disease, unspecified: Secondary | ICD-10-CM

## 2021-02-24 DIAGNOSIS — E049 Nontoxic goiter, unspecified: Secondary | ICD-10-CM | POA: Insufficient documentation

## 2021-02-24 DIAGNOSIS — G2581 Restless legs syndrome: Secondary | ICD-10-CM | POA: Insufficient documentation

## 2021-02-24 DIAGNOSIS — M461 Sacroiliitis, not elsewhere classified: Secondary | ICD-10-CM | POA: Insufficient documentation

## 2021-02-24 DIAGNOSIS — I509 Heart failure, unspecified: Secondary | ICD-10-CM | POA: Insufficient documentation

## 2021-02-24 DIAGNOSIS — E559 Vitamin D deficiency, unspecified: Secondary | ICD-10-CM | POA: Insufficient documentation

## 2021-02-24 DIAGNOSIS — I5030 Unspecified diastolic (congestive) heart failure: Secondary | ICD-10-CM | POA: Insufficient documentation

## 2021-02-24 DIAGNOSIS — R1310 Dysphagia, unspecified: Secondary | ICD-10-CM | POA: Insufficient documentation

## 2021-02-24 DIAGNOSIS — R32 Unspecified urinary incontinence: Secondary | ICD-10-CM | POA: Insufficient documentation

## 2021-02-24 DIAGNOSIS — M5126 Other intervertebral disc displacement, lumbar region: Secondary | ICD-10-CM | POA: Insufficient documentation

## 2021-02-24 DIAGNOSIS — K5900 Constipation, unspecified: Secondary | ICD-10-CM | POA: Insufficient documentation

## 2021-02-24 DIAGNOSIS — M5430 Sciatica, unspecified side: Secondary | ICD-10-CM | POA: Insufficient documentation

## 2021-02-24 DIAGNOSIS — F322 Major depressive disorder, single episode, severe without psychotic features: Secondary | ICD-10-CM | POA: Insufficient documentation

## 2021-02-24 DIAGNOSIS — J309 Allergic rhinitis, unspecified: Secondary | ICD-10-CM | POA: Insufficient documentation

## 2021-02-24 DIAGNOSIS — D6869 Other thrombophilia: Secondary | ICD-10-CM | POA: Insufficient documentation

## 2021-02-24 NOTE — Progress Notes (Signed)
Virtual Visit via Telephone Note   I connected with Wendy [name] on 02/24/2021 by telephone and verified that I was speaking with the correct person using two identifiers. I am located at [facility] office.   The limitations of evaluation and management by telemedicine and the availability of in person appointments have been previously discussed with the patient and are documented in the patients chart. The patient expressed understanding and consented to proceed.  PCP: Josetta Huddle, MD  Chief Complaint: follow up  History of Present Illness: Wendy [name] is a 70 y.o. female with hx of PAD.  She has hx of left to right femorofemoral bypass grafting in [facility] in 1999.  She subsequently had rest pain at night and her walking was limited severely.  She underwent reexploration bilateral common femoral arteries with aortobifemoral bypass grafting with reimplantation of right profunda femoris artery on the aortobifemoral limb on 06/18/2019 by Dr. Donzetta Matters.  She required cardiac catheterization post operatively with need for Impella via right axillary artery.  She developed a left brachial artery psa and underwent repair of this by Dr. Trula Slade on 06/30/2019.  She had triphasic left radial and ulnar doppler signals at the completion of the case.   She was seen a year ago and she was doing well.  She had some right leg pain but felt it was due to hx of right hip replacement.  She also has hx of chronic back pain.    She states her legs are weak but she denies claudication, rest pain or non healing wounds.  She states she has been seeing two small black dots out of her right eye for about a month.  She had an appt with eye doctor that she missed and has to reschedule.  She has not had any unilateral weakness, numbness or paralysis.    The pt is on a statin for cholesterol management.  The pt is on a daily aspirin.   Other AC:  Eliquis The pt is on diuretic for hypertension.   The pt is not diabetic.    Tobacco hx:  former   Past Medical History:  Diagnosis Date   Anemia    Anginal pain ([place])    jaw pain 07/2016, s/p 07/15/16 nuclear stress test   Anxiety    Arthritis    "lower back" (08/24/2017)   Asthma    [place]ornic Obstructive Pulmonary Disease    Chronic back pain    Chronic combined systolic and diastolic CHF    EF 9/70: 26-37 // EF 4/21: 55-60 // EF 4/21: 20-25   Chronic leg pain    Chronic lower back pain    Coronary Artery Disease    a. s/p NSTEMI >> CABG 1998 // s/p PCI 2013 // [place] 01/2015: DES to SVG-RCA.// Cath 05/2019: all grafts occluded; mod non-obs LAD disease w L-L and L-R collats - med Rx // post Ao-Bifem bypass MI c/b PEA, CGS requiring Impella >> anatomy unchanged - Med Rx   Depression    Fall 08/2016   Gastroesophageal reflux disease    Glucose intolerance    Headache    "a few/month" (08/24/2017)   HLD (hyperlipidemia)    Hx of DVT X 3   RLE   Hypertension    Myocardial infarction ([place]) 1999   OSA on CPAP    Paroxysmal atrial fibrillation    a. 09/2013 post-op b. 01/2015 // Apixaban // recurrent AFib post MI after Ao-Bifem in 06/2019 >> Amio Rx  Peripheral artery disease    s/p L fem-fem bypass // s/p Ao-Bifem bypass in 06/2019   Peripheral neuropathy    Pneumonia    Shortness of breath    when walking   Wears glasses     Past Surgical History:  Procedure Laterality Date   ABDOMINAL AORTOGRAM W/LOWER EXTREMITY Bilateral 04/29/2019   Procedure: ABDOMINAL AORTOGRAM W/LOWER EXTREMITY;  Surgeon: Waynetta Sandy, MD;  Location: [place] [facility];  Service: Cardiovascular;  Laterality: Bilateral;   ANTERIOR CERVICAL DECOMP/DISCECTOMY FUSION  1991   AORTA - BILATERAL FEMORAL ARTERY BYPASS GRAFT N/A 06/18/2019   Procedure: AORTA BIFEMORAL BYPASS GRAFT;  Surgeon: Waynetta Sandy, MD;  Location: [place];  Service: Vascular;  Laterality: N/A;   ARTERY REPAIR Left 06/30/2019   Procedure: LEFT BRACHIAL ARTERY REPAIR, EVACUATION OF HEMATOMA;   Surgeon: Serafina Mitchell, MD;  Location: [place];  Service: Vascular;  Laterality: Left;   BACK SURGERY     CARDIAC CATHETERIZATION N/A 01/22/2015   Procedure: Left Heart Cath and Coronary Angiography;  Surgeon: Troy Sine, MD;  Location: [place] [facility];  Service: Cardiovascular;  Laterality: N/A;   CARDIAC CATHETERIZATION N/A 01/22/2015   Procedure: Coronary Stent Intervention;  Surgeon: Troy Sine, MD;  Location: [place] [facility];  Service: Cardiovascular;  Laterality: N/A;  3.0x12 xience prox SVG to RCA   CARDIAC CATHETERIZATION  03/09/2000   hx/notes 03/20/2000   CHOLECYSTECTOMY N/A 09/16/2013   Procedure: LAPAROSCOPIC CHOLECYSTECTOMY CONVERTED TO OPEN CHOLECYSTECTOMY WITH CHOLANGIOGRAM;  Surgeon: [name], MD;  Location: [place];  Service: General;  Laterality: N/A;   CORONARY ANGIOGRAPHY N/A 06/27/2019   Procedure: CORONARY ANGIOGRAPHY (CATH LAB);  Surgeon: Sherren Mocha, MD;  Location: [place] [facility];  Service: Cardiovascular;  Laterality: N/A;   CORONARY ANGIOPLASTY WITH STENT PLACEMENT  11/2011, 02/2012, 01/2015   DES to SVG-RCA both times, In [place], [place], Dr. Bruce Donath   CORONARY ARTERY BYPASS GRAFT  1998   LIMA-LAD, SVG-OM, SVG-RCA   ERCP N/A 09/19/2013   Procedure: ENDOSCOPIC RETROGRADE CHOLANGIOPANCREATOGRAPHY (ERCP);  Surgeon: [name], MD;  Location: [place];  Service: Endoscopy;  Laterality: N/A;   FINGER SURGERY Right    ring finger "fused"   FRACTURE SURGERY     IRRIGATION AND DEBRIDEMENT ABSCESS Right 10/17/2013   Procedure: INCISION AND DRAINAGE RIGHT SUBCOSTAL WOUND;  Surgeon: [name], MD;  Location: [facility];  Service: General;  Laterality: Right;   JOINT REPLACEMENT     KNEE ARTHROSCOPY Right 1998   LEFT HEART CATH AND CORS/GRAFTS ANGIOGRAPHY N/A 07/25/2016   Procedure: Left Heart Cath and Cors/Grafts Angiography;  Surgeon: Jettie Booze, MD;  Location: [place] [facility];  Service: Cardiovascular;  Laterality: N/A;    LEFT HEART CATH AND CORS/GRAFTS ANGIOGRAPHY N/A 06/05/2019   Procedure: LEFT HEART CATH AND CORS/GRAFTS ANGIOGRAPHY;  Surgeon: Burnell Blanks, MD;  Location: [place] [facility];  Service: Cardiovascular;  Laterality: N/A;   LEFT HEART CATHETERIZATION WITH CORONARY/GRAFT ANGIOGRAM N/A 07/19/2013   Procedure: LEFT HEART CATHETERIZATION WITH Beatrix Fetters;  Surgeon: Leonie Man, MD;  Location: [facility] C[facility];  Service: Cardiovascular;  Laterality: N/A;   MASS EXCISION Right 06/25/2014   Procedure: EXCISION BUTTOCK MASS ;  Surgeon: Coralie Keens, MD;  Location: [place];  Service: General;  Laterality: Right;   ORIF HUMERUS FRACTURE Left 08/27/2013   Procedure: OPEN REDUCTION INTERNAL FIXATION (ORIF) LEFT HUMERUS ;  Surgeon: Rozanna Box, MD;  Location: [facility]  OR;  Service: Orthopedics;  Laterality: Left;   PLACEMENT OF IMPELLA LEFT VENTRICULAR ASSIST DEVICE N/A 06/24/2019   Procedure: PLACEMENT OF IMPELLA LEFT VENTRICULAR ASSIST DEVICE;  Surgeon: Wonda Olds, MD;  Location: [place];  Service: Open Heart Surgery;  Laterality: N/A;   POSTERIOR FUSION CERVICAL SPINE  1992   REMOVAL OF IMPELLA LEFT VENTRICULAR ASSIST DEVICE Right 06/28/2019   Procedure: REMOVAL OF IMPELLA LEFT VENTRICULAR ASSIST DEVICE;  Surgeon: Wonda Olds, MD;  Location: [place];  Service: Open Heart Surgery;  Laterality: Right;   THROMBECTOMY / EMBOLECTOMY FEMORAL ARTERY Right    hx/notes 03/20/2000   TOTAL HIP ARTHROPLASTY Right 07/19/2016   Procedure: TOTAL HIP ARTHROPLASTY ANTERIOR APPROACH;  Surgeon: [name], MD;  Location: [place];  Service: Orthopedics;  Laterality: Right;   TOTAL HIP REVISION Right 08/22/2017   Procedure: TOTAL HIP REVISION;  Surgeon: [name], MD;  Location: [place];  Service: Orthopedics;  Laterality: Right;    shoulder  No outpatient medications have been marked as taking for the 02/24/21 encounter (Office Visit) with Gabriel Earing, PA-C.    Current Facility-Administered Medications for the 02/24/21 encounter (Office Visit) with Gabriel Earing, PA-C  Medication   ferrous sulfate tablet 325 mg    12 system ROS was negative unless otherwise noted in HPI   Observations/Objective: ABI 02/23/2021: ABI Findings:  +---------+------------------+-----+--------+--------+   Right     Rt Pressure (mmHg) Index Waveform Comment    +---------+------------------+-----+--------+--------+   Brachial  131                                          +---------+------------------+-----+--------+--------+   PTA       118                0.90  biphasic            +---------+------------------+-----+--------+--------+   DP        138                1.05  biphasic            +---------+------------------+-----+--------+--------+   Great Toe 76                 0.58  Abnormal            +---------+------------------+-----+--------+--------+   +---------+------------------+-----+--------+-------+   Left      Lt Pressure (mmHg) Index Waveform Comment   +---------+------------------+-----+--------+-------+   Brachial  128                                         +---------+------------------+-----+--------+-------+   PTA       125                0.95  biphasic           +---------+------------------+-----+--------+-------+   DP        113                0.86  biphasic           +---------+------------------+-----+--------+-------+   Great Toe 75                 0.57  Abnormal           +---------+------------------+-----+--------+-------+  Carotid duplex 02/23/2021: Bilateral ICA stenosis of 1-39% Vertebrals:  Bilateral vertebral arteries demonstrate antegrade flow.  Subclavians: Normal flow hemodynamics were seen in bilateral subclavian arteries.   Assessment and Plan: 70 y.o. female with hx of left to right femorofemoral bypass grafting in [place] in 1999.  She subsequently had rest pain at night and her walking was limited severely.   She underwent reexploration bilateral common femoral arteries with aortobifemoral bypass grafting with reimplantation of right profunda femoris artery on the aortobifemoral limb on 06/18/2019 by Dr. Donzetta Matters.   -pt without claudication, rest pain or non healing wounds.  Her ABI's are essentially unchanged.  She does have a decrease in the TBI.   -continue walking regimen  -she knows to call if she develops rest pain or non healing wounds -continue statin/asa -carotid duplex was obtained as she had not had one in the past - this revealed 1-39% bilateral ICA stenosis.  She is seeing two dots through the left eye for about a month.  I discussed with Dr. Donzetta Matters and do not feel this is due to carotid stenosis.  Encouraged her to reschedule appointment with her eye doctor.    I discussed the assessment and treatment plan with the patient. The patient was provided an opportunity to ask questions and all were answered. The patient agreed with the plan and demonstrated an understanding of the instructions.   The patient was advised to call back or seek an in-person evaluation if the symptoms worsen or if the condition fails to improve as anticipated.  I spent 8 minutes with the patient via telephone encounter.   Signed, Leontine Locket, PA-C [facility] Office: [phone]  02/24/2021, 2:28 PM

## 2021-03-02 DIAGNOSIS — I509 Heart failure, unspecified: Secondary | ICD-10-CM | POA: Diagnosis not present

## 2021-03-05 DIAGNOSIS — I10 Essential (primary) hypertension: Secondary | ICD-10-CM | POA: Diagnosis not present

## 2021-03-15 ENCOUNTER — Other Ambulatory Visit: Payer: Self-pay | Admitting: Family

## 2021-03-15 NOTE — Telephone Encounter (Signed)
Rx request sent to pharmacy.  

## 2021-04-07 DIAGNOSIS — I480 Paroxysmal atrial fibrillation: Secondary | ICD-10-CM | POA: Diagnosis not present

## 2021-04-07 DIAGNOSIS — I10 Essential (primary) hypertension: Secondary | ICD-10-CM | POA: Diagnosis not present

## 2021-04-07 DIAGNOSIS — D6869 Other thrombophilia: Secondary | ICD-10-CM | POA: Diagnosis not present

## 2021-04-07 DIAGNOSIS — J449 Chronic obstructive pulmonary disease, unspecified: Secondary | ICD-10-CM | POA: Diagnosis not present

## 2021-04-07 DIAGNOSIS — E785 Hyperlipidemia, unspecified: Secondary | ICD-10-CM | POA: Diagnosis not present

## 2021-04-07 DIAGNOSIS — I509 Heart failure, unspecified: Secondary | ICD-10-CM | POA: Diagnosis not present

## 2021-04-20 DIAGNOSIS — H5213 Myopia, bilateral: Secondary | ICD-10-CM | POA: Diagnosis not present

## 2021-05-21 DIAGNOSIS — I480 Paroxysmal atrial fibrillation: Secondary | ICD-10-CM | POA: Diagnosis not present

## 2021-05-21 DIAGNOSIS — E785 Hyperlipidemia, unspecified: Secondary | ICD-10-CM | POA: Diagnosis not present

## 2021-05-21 DIAGNOSIS — I25810 Atherosclerosis of coronary artery bypass graft(s) without angina pectoris: Secondary | ICD-10-CM | POA: Diagnosis not present

## 2021-05-21 DIAGNOSIS — I509 Heart failure, unspecified: Secondary | ICD-10-CM | POA: Diagnosis not present

## 2021-05-21 DIAGNOSIS — D6869 Other thrombophilia: Secondary | ICD-10-CM | POA: Diagnosis not present

## 2021-05-21 DIAGNOSIS — J449 Chronic obstructive pulmonary disease, unspecified: Secondary | ICD-10-CM | POA: Diagnosis not present

## 2021-05-21 DIAGNOSIS — N390 Urinary tract infection, site not specified: Secondary | ICD-10-CM | POA: Diagnosis not present

## 2021-05-31 DIAGNOSIS — I509 Heart failure, unspecified: Secondary | ICD-10-CM | POA: Diagnosis not present

## 2021-06-18 ENCOUNTER — Other Ambulatory Visit: Payer: Self-pay

## 2021-06-18 DIAGNOSIS — I480 Paroxysmal atrial fibrillation: Secondary | ICD-10-CM | POA: Diagnosis not present

## 2021-06-18 DIAGNOSIS — E785 Hyperlipidemia, unspecified: Secondary | ICD-10-CM | POA: Diagnosis not present

## 2021-06-18 DIAGNOSIS — J449 Chronic obstructive pulmonary disease, unspecified: Secondary | ICD-10-CM | POA: Diagnosis not present

## 2021-06-18 DIAGNOSIS — I10 Essential (primary) hypertension: Secondary | ICD-10-CM | POA: Diagnosis not present

## 2021-06-18 DIAGNOSIS — I509 Heart failure, unspecified: Secondary | ICD-10-CM | POA: Diagnosis not present

## 2021-06-18 MED ORDER — NITROGLYCERIN 0.4 MG SL SUBL: 0.4000 mg | SUBLINGUAL_TABLET | SUBLINGUAL | 2 refills | Status: DC | PRN

## 2021-06-18 NOTE — Telephone Encounter (Signed)
Pt's medication was sent to pt's pharmacy as requested. Confirmation received.  °

## 2021-06-22 DIAGNOSIS — H524 Presbyopia: Secondary | ICD-10-CM | POA: Diagnosis not present

## 2021-07-01 DIAGNOSIS — I509 Heart failure, unspecified: Secondary | ICD-10-CM | POA: Diagnosis not present

## 2021-07-05 DIAGNOSIS — I509 Heart failure, unspecified: Secondary | ICD-10-CM | POA: Diagnosis not present

## 2021-07-05 DIAGNOSIS — D6869 Other thrombophilia: Secondary | ICD-10-CM | POA: Diagnosis not present

## 2021-07-05 DIAGNOSIS — H5203 Hypermetropia, bilateral: Secondary | ICD-10-CM | POA: Diagnosis not present

## 2021-07-05 DIAGNOSIS — J449 Chronic obstructive pulmonary disease, unspecified: Secondary | ICD-10-CM | POA: Diagnosis not present

## 2021-07-05 DIAGNOSIS — E785 Hyperlipidemia, unspecified: Secondary | ICD-10-CM | POA: Diagnosis not present

## 2021-07-05 DIAGNOSIS — I25810 Atherosclerosis of coronary artery bypass graft(s) without angina pectoris: Secondary | ICD-10-CM | POA: Diagnosis not present

## 2021-07-05 DIAGNOSIS — R21 Rash and other nonspecific skin eruption: Secondary | ICD-10-CM | POA: Diagnosis not present

## 2021-07-05 DIAGNOSIS — I480 Paroxysmal atrial fibrillation: Secondary | ICD-10-CM | POA: Diagnosis not present

## 2021-07-20 DIAGNOSIS — H905 Unspecified sensorineural hearing loss: Secondary | ICD-10-CM | POA: Diagnosis not present

## 2021-08-05 DIAGNOSIS — I509 Heart failure, unspecified: Secondary | ICD-10-CM | POA: Diagnosis not present

## 2021-09-09 ENCOUNTER — Other Ambulatory Visit: Payer: Self-pay | Admitting: Cardiovascular Disease

## 2021-09-09 NOTE — Telephone Encounter (Signed)
Refill authorized for #30 tablets only. Pt last seen on 08/24/20 by Laurann Montana and advised to follow-up in 6 months. Pt now past one year mark. Needs OV for more.

## 2021-09-28 DIAGNOSIS — N390 Urinary tract infection, site not specified: Secondary | ICD-10-CM | POA: Diagnosis not present

## 2021-09-28 DIAGNOSIS — G894 Chronic pain syndrome: Secondary | ICD-10-CM | POA: Diagnosis not present

## 2021-09-28 DIAGNOSIS — J449 Chronic obstructive pulmonary disease, unspecified: Secondary | ICD-10-CM | POA: Diagnosis not present

## 2021-09-28 DIAGNOSIS — I480 Paroxysmal atrial fibrillation: Secondary | ICD-10-CM | POA: Diagnosis not present

## 2021-09-28 DIAGNOSIS — D6869 Other thrombophilia: Secondary | ICD-10-CM | POA: Diagnosis not present

## 2021-09-28 DIAGNOSIS — E785 Hyperlipidemia, unspecified: Secondary | ICD-10-CM | POA: Diagnosis not present

## 2021-09-28 DIAGNOSIS — I509 Heart failure, unspecified: Secondary | ICD-10-CM | POA: Diagnosis not present

## 2021-09-30 DIAGNOSIS — I509 Heart failure, unspecified: Secondary | ICD-10-CM | POA: Diagnosis not present

## 2021-11-03 ENCOUNTER — Encounter (HOSPITAL_COMMUNITY): Payer: Self-pay

## 2021-11-03 ENCOUNTER — Ambulatory Visit (HOSPITAL_COMMUNITY)
Admission: EM | Admit: 2021-11-03 | Discharge: 2021-11-03 | Disposition: A | Discharge status: Home / Self Care | Payer: Medicare Other | Attending: Family Medicine | Admitting: Family Medicine

## 2021-11-03 ENCOUNTER — Ambulatory Visit (INDEPENDENT_AMBULATORY_CARE_PROVIDER_SITE_OTHER): Payer: Medicare Other

## 2021-11-03 DIAGNOSIS — S52501A Unspecified fracture of the lower end of right radius, initial encounter for closed fracture: Secondary | ICD-10-CM | POA: Diagnosis not present

## 2021-11-03 DIAGNOSIS — S52591A Other fractures of lower end of right radius, initial encounter for closed fracture: Secondary | ICD-10-CM | POA: Diagnosis not present

## 2021-11-03 DIAGNOSIS — W19XXXA Unspecified fall, initial encounter: Secondary | ICD-10-CM | POA: Diagnosis not present

## 2021-11-03 DIAGNOSIS — M25531 Pain in right wrist: Secondary | ICD-10-CM

## 2021-11-03 DIAGNOSIS — M189 Osteoarthritis of first carpometacarpal joint, unspecified: Secondary | ICD-10-CM | POA: Diagnosis not present

## 2021-11-03 NOTE — Discharge Instructions (Addendum)
Please rest, ice and elevate the affected extremity. If not allergic, you may take Tylenol '1000mg'$  every 6 hours or as needed for discomfort. Follow up with orthopaedic surgery within one week for further evaluation. Please call for any appointment. Do not remove your splint. You may use a garbage bag while showering to keep your splint dry. Please return here if you are experiencing worsening pain, tingling/numbness, swelling, redness, or fever.  You may continue to take your Percocet as needed.

## 2021-11-03 NOTE — ED Triage Notes (Signed)
Pt fell yesterday and injured right wrist. Pt denies hitting head or loosing consciousness. Pt states that wrist is painful and swelling.

## 2021-11-03 NOTE — Progress Notes (Signed)
Orthopedic Tech Progress Note Patient Details:  Wendy [name] 08-19-1950 [id number]  Ortho Devices Type of Ortho Device: Sugartong splint, Arm sling Ortho Device/Splint Location: RUE Ortho Device/Splint Interventions: Ordered, Application, Adjustment   Post Interventions Patient Tolerated: Well Instructions Provided: Adjustment of device, Care of device  Arville Go 11/03/2021, 5:10 PM

## 2021-11-03 NOTE — ED Provider Notes (Signed)
[place]   [id number] 11/03/21 Arrival Time: 0321  ASSESSMENT & PLAN:  1. Closed fracture of distal end of right radius, unspecified fracture morphology, initial encounter   2. Right wrist pain     I have personally viewed the imaging studies ordered this visit. Non-displaced distal R radius fx.    Discharge Instructions      Please rest, ice and elevate the affected extremity. If not allergic, you may take Tylenol '1000mg'$  every 6 hours or as needed for discomfort. Follow up with orthopaedic surgery within one week for further evaluation. Please call for any appointment. Do not remove your splint. You may use a garbage bag while showering to keep your splint dry. Please return here if you are experiencing worsening pain, tingling/numbness, swelling, redness, or fever.  You may continue to take your Percocet as needed.      Orders Placed This Encounter  Procedures   DG Wrist Complete Right   Apply splint short arm (sugartong)   Apply Shoulder Immobilizer/Sling   Recommend:  Follow-up Information     Schedule an appointment as soon as possible for a visit  with Iran Planas, MD.   Specialty: Orthopedic Surgery Contact information: [address] [place] [place] [address] [phone]                 Reviewed expectations re: course of current medical issues. Questions answered. Outlined signs and symptoms indicating need for more acute intervention. Patient verbalized understanding. After Visit Summary given.  SUBJECTIVE: History from: patient. Wendy [name] is a 71 y.o. female who reports R wrist pain s/p [place]; yesterday; swollen this am prompting evaluation. No extremity sensation changes or weakness. No tx PTA. Is painful.  Past Surgical History:  Procedure Laterality Date   ABDOMINAL AORTOGRAM W/LOWER EXTREMITY Bilateral 04/29/2019   Procedure: ABDOMINAL AORTOGRAM W/LOWER EXTREMITY;  Surgeon: Waynetta Sandy, MD;   Location: [facility];  Service: Cardiovascular;  Laterality: Bilateral;   ANTERIOR CERVICAL DECOMP/DISCECTOMY FUSION  1991   AORTA - BILATERAL FEMORAL ARTERY BYPASS GRAFT N/A 06/18/2019   Procedure: AORTA BIFEMORAL BYPASS GRAFT;  Surgeon: Waynetta Sandy, MD;  Location: [facility];  Service: Vascular;  Laterality: N/A;   ARTERY REPAIR Left 06/30/2019   Procedure: LEFT BRACHIAL ARTERY REPAIR, EVACUATION OF HEMATOMA;  Surgeon: Serafina Mitchell, MD;  Location: [facility];  Service: Vascular;  Laterality: Left;   BACK SURGERY     CARDIAC CATHETERIZATION N/A 01/22/2015   Procedure: Left Heart Cath and Coronary Angiography;  Surgeon: Troy Sine, MD;  Location: [facility];  Service: Cardiovascular;  Laterality: N/A;   CARDIAC CATHETERIZATION N/A 01/22/2015   Procedure: Coronary Stent Intervention;  Surgeon: Troy Sine, MD;  Location: [facility];  Service: Cardiovascular;  Laterality: N/A;  3.0x12 xience prox SVG to RCA   CARDIAC CATHETERIZATION  03/09/2000   hx/notes 03/20/2000   CHOLECYSTECTOMY N/A 09/16/2013   Procedure: LAPAROSCOPIC CHOLECYSTECTOMY CONVERTED TO OPEN CHOLECYSTECTOMY WITH CHOLANGIOGRAM;  Surgeon: [name], MD;  Location: [facility];  Service: General;  Laterality: N/A;   CORONARY ANGIOGRAPHY N/A 06/27/2019   Procedure: CORONARY ANGIOGRAPHY (CATH LAB);  Surgeon: Sherren Mocha, MD;  Location: [facility];  Service: Cardiovascular;  Laterality: N/A;   CORONARY ANGIOPLASTY WITH STENT PLACEMENT  11/2011, 02/2012, 01/2015   DES to SVG-RCA both times, In [place], [place], Dr. Bruce Donath   CORONARY ARTERY BYPASS GRAFT  1998   LIMA-LAD, SVG-OM, SVG-RCA   ERCP N/A  09/19/2013   Procedure: ENDOSCOPIC RETROGRADE CHOLANGIOPANCREATOGRAPHY (ERCP);  Surgeon: [name], MD;  Location: [facility];  Service: Endoscopy;  Laterality: N/A;   FINGER SURGERY Right    ring finger "fused"   FRACTURE SURGERY     IRRIGATION AND DEBRIDEMENT ABSCESS Right 10/17/2013    Procedure: INCISION AND DRAINAGE RIGHT SUBCOSTAL WOUND;  Surgeon: [name], MD;  [facility];  Service: General;  Laterality: Right;   JOINT REPLACEMENT     KNEE ARTHROSCOPY Right 1998   LEFT HEART CATH AND CORS/GRAFTS ANGIOGRAPHY N/A 07/25/2016   Procedure: Left Heart Cath and Cors/Grafts Angiography;  Surgeon: Jettie Booze, MD;  Location: [facility];  Service: Cardiovascular;  Laterality: N/A;   LEFT HEART CATH AND CORS/GRAFTS ANGIOGRAPHY N/A 06/05/2019   Procedure: LEFT HEART CATH AND CORS/GRAFTS ANGIOGRAPHY;  Surgeon: Burnell Blanks, MD;  Location: [facility];  Service: Cardiovascular;  Laterality: N/A;   LEFT HEART CATHETERIZATION WITH CORONARY/GRAFT ANGIOGRAM N/A 07/19/2013   Procedure: LEFT HEART CATHETERIZATION WITH Beatrix Fetters;  Surgeon: Leonie Man, MD;  [facility];  Service: Cardiovascular;  Laterality: N/A;   MASS EXCISION Right 06/25/2014   Procedure: EXCISION BUTTOCK MASS ;  Surgeon: Coralie Keens, MD;  Location: [facility];  Service: General;  Laterality: Right;   ORIF HUMERUS FRACTURE Left 08/27/2013   Procedure: OPEN REDUCTION INTERNAL FIXATION (ORIF) LEFT HUMERUS ;  Surgeon: Rozanna Box, MD;  Location: [facility];  Service: Orthopedics;  Laterality: Left;   PLACEMENT OF IMPELLA LEFT VENTRICULAR ASSIST DEVICE N/A 06/24/2019   Procedure: PLACEMENT OF IMPELLA LEFT VENTRICULAR ASSIST DEVICE;  Surgeon: Wonda Olds, MD;  Location: [facility];  Service: Open Heart Surgery;  Laterality: N/A;   POSTERIOR FUSION CERVICAL SPINE  1992   REMOVAL OF IMPELLA LEFT VENTRICULAR ASSIST DEVICE Right 06/28/2019   Procedure: REMOVAL OF IMPELLA LEFT VENTRICULAR ASSIST DEVICE;  Surgeon: Wonda Olds, MD;  Location: [facility];  Service: Open Heart Surgery;  Laterality: Right;   THROMBECTOMY / EMBOLECTOMY FEMORAL ARTERY Right    hx/notes 03/20/2000   TOTAL HIP ARTHROPLASTY Right 07/19/2016   Procedure: TOTAL HIP  ARTHROPLASTY ANTERIOR APPROACH;  Surgeon: [name], MD;  Location: [facility];  Service: Orthopedics;  Laterality: Right;   TOTAL HIP REVISION Right 08/22/2017   Procedure: TOTAL HIP REVISION;  Surgeon: [name], MD;  Location: [facility];  Service: Orthopedics;  Laterality: Right;      OBJECTIVE:  Vitals:   11/03/21 1549 11/03/21 1551  BP:  134/84  Pulse:  60  Resp:  12  Temp:  98.3 F (36.8 C)  TempSrc:  Oral  SpO2:  96%  Weight: 54.5 kg   Height: '5\' 1"'$  (1.549 m)     General appearance: alert; no distress HEENT: [place]; AT Neck: supple with FROM Resp: unlabored respirations Extremities: RUE: warm with well perfused appearance; fairly well localized moderate tenderness over right radial wrist; without gross deformities; swelling: moderate; bruising: none; wrist ROM: limited by reported pain CV: brisk extremity capillary refill of RUE; 2+ radial pulse of RUE. Skin: warm and dry; no visible rashes Neurologic: gait normal; normal sensation and strength of RUE Psychological: alert and cooperative; normal mood and affect  Imaging: DG Wrist Complete Right  Result Date: 11/03/2021 CLINICAL DATA:  Trauma, fall EXAM: RIGHT WRIST - COMPLETE 3+ VIEW COMPARISON:  Right hand study done on 08/15/2016 FINDINGS: There is comminuted fracture in the distal metaphyseal region of right radius.  There is buckling of dorsal cortical margin of the fracture site. As far as seen, there is no definite extension of fracture line to the articular surface. No other fractures are seen. Degenerative changes are noted with bony spurs in first carpometacarpal and first metacarpophalangeal joints. There is surgical hardware in right ring finger. IMPRESSION: Comminuted, essentially undisplaced fracture is seen in the distal metaphyseal region of the right radius. Electronically Signed   By: Elmer Picker M.D.   On: 11/03/2021 16:28      Allergies  Allergen Reactions   Benadryl [Diphenhydramine]  Shortness Of Breath   Penicillins Diarrhea    Did it involve swelling of the face/tongue/throat, SOB, or low BP? No Did it involve sudden or severe rash/hives, skin peeling, or any reaction on the inside of your mouth or nose? No Did you need to seek medical attention at a hospital or doctor's office? No When did it last happen?      2 months If all above answers are "NO", may proceed with cephalosporin use.   Adhesive [Tape] Other (See Comments)    Burning of skin   Amoxicillin Diarrhea and Other (See Comments)    Has patient had a PCN reaction causing immediate rash, facial/tongue/throat swelling, SOB or lightheadedness with hypotension: No Has patient had a PCN reaction causing severe rash involving mucus membranes or skin necrosis: No Has patient had a PCN reaction that required hospitalization No Has patient had a PCN reaction occurring within the last 10 years: Yes -- noted rxn as diarrhea If all of the above answers are "NO", then may proceed with Cephalosporin use.    Hydrocodone-Acetaminophen Other (See Comments)   Cyclobenzaprine Other (See Comments)    Causes restless leg syndrome Restless syndrome   Hydrocodone Itching    Past Medical History:  Diagnosis Date   Anemia    Anginal pain ([place])    jaw pain 07/2016, s/p 07/15/16 nuclear stress test   Anxiety    Arthritis    "lower back" (08/24/2017)   Asthma    [place] Obstructive Pulmonary Disease    Chronic back pain    Chronic combined systolic and diastolic CHF    EF 6/71: 24-58 // EF 4/21: 55-60 // EF 4/21: 20-25   Chronic leg pain    Chronic lower back pain    Coronary Artery Disease    a. s/p NSTEMI >> CABG 1998 // s/p PCI 2013 // [place] 01/2015: DES to SVG-RCA.// Cath 05/2019: all grafts occluded; mod non-obs LAD disease w L-L and L-R collats - med Rx // post Ao-Bifem bypass MI c/b PEA, CGS requiring Impella >> anatomy unchanged - Med Rx   Depression    Fall 08/2016   Gastroesophageal reflux disease    Glucose  intolerance    Headache    "a few/month" (08/24/2017)   HLD (hyperlipidemia)    Hx of DVT X 3   RLE   Hypertension    Myocardial infarction ([place]) 1999   OSA on CPAP    Paroxysmal atrial fibrillation    a. 09/2013 post-op b. 01/2015 // Apixaban // recurrent AFib post MI after Ao-Bifem in 06/2019 >> Amio Rx   Peripheral artery disease    s/p L fem-fem bypass // s/p Ao-Bifem bypass in 06/2019   Peripheral neuropathy    Pneumonia    Shortness of breath    when walking   Wears glasses    Social History   Socioeconomic History   Marital status: Divorced    Spouse  name: Not on file   Number of children: 5   Years of education: Not on file   Highest education level: Not on file  Occupational History   Occupation: Disabled  Tobacco Use   Smoking status: Former    Packs/day: 0.50    Years: 34.00    Total pack years: 17.00    Types: Cigarettes    Quit date: 07/20/1998    Years since quitting: 23.3   Smokeless tobacco: Never  Vaping Use   Vaping Use: Never used  Substance and Sexual Activity   Alcohol use: Not Currently   Drug use: Yes    Types: Marijuana   Sexual activity: Not Currently  Other Topics Concern   Not on file  Social History Narrative   Lives with best friend.    Social Determinants of Health   Financial Resource Strain: Not on file  Food Insecurity: Not on file  Transportation Needs: Not on file  Physical Activity: Not on file  Stress: Not on file  Social Connections: Not on file   Family History  Problem Relation Age of Onset   Lung cancer Mother    Clotting disorder Father    Lung cancer Sister    Heart disease Brother    Heart disease Brother    Colon cancer Neg Hx    Breast cancer Neg Hx    Past Surgical History:  Procedure Laterality Date   ABDOMINAL AORTOGRAM W/LOWER EXTREMITY Bilateral 04/29/2019   Procedure: ABDOMINAL AORTOGRAM W/LOWER EXTREMITY;  Surgeon: Waynetta Sandy, MD;  Location: [facility];  Service:  Cardiovascular;  Laterality: Bilateral;   ANTERIOR CERVICAL DECOMP/DISCECTOMY FUSION  1991   AORTA - BILATERAL FEMORAL ARTERY BYPASS GRAFT N/A 06/18/2019   Procedure: AORTA BIFEMORAL BYPASS GRAFT;  Surgeon: Waynetta Sandy, MD;  Location: [facility];  Service: Vascular;  Laterality: N/A;   ARTERY REPAIR Left 06/30/2019   Procedure: LEFT BRACHIAL ARTERY REPAIR, EVACUATION OF HEMATOMA;  Surgeon: Serafina Mitchell, MD;  Location: [facility];  Service: Vascular;  Laterality: Left;   BACK SURGERY     CARDIAC CATHETERIZATION N/A 01/22/2015   Procedure: Left Heart Cath and Coronary Angiography;  Surgeon: Troy Sine, MD;  Location: [facility];  Service: Cardiovascular;  Laterality: N/A;   CARDIAC CATHETERIZATION N/A 01/22/2015   Procedure: Coronary Stent Intervention;  Surgeon: Troy Sine, MD;  Location: [facility];  Service: Cardiovascular;  Laterality: N/A;  3.0x12 xience prox SVG to RCA   CARDIAC CATHETERIZATION  03/09/2000   hx/notes 03/20/2000   CHOLECYSTECTOMY N/A 09/16/2013   Procedure: LAPAROSCOPIC CHOLECYSTECTOMY CONVERTED TO OPEN CHOLECYSTECTOMY WITH CHOLANGIOGRAM;  Surgeon: [name], MD;  Location: [facility];  Service: General;  Laterality: N/A;   CORONARY ANGIOGRAPHY N/A 06/27/2019   Procedure: CORONARY ANGIOGRAPHY (CATH LAB);  Surgeon: Sherren Mocha, MD;  Location: [facility];  Service: Cardiovascular;  Laterality: N/A;   CORONARY ANGIOPLASTY WITH STENT PLACEMENT  11/2011, 02/2012, 01/2015   DES to SVG-RCA both times, In [place], [place], Dr. Bruce Donath   CORONARY ARTERY BYPASS GRAFT  1998   LIMA-LAD, SVG-OM, SVG-RCA   ERCP N/A 09/19/2013   Procedure: ENDOSCOPIC RETROGRADE CHOLANGIOPANCREATOGRAPHY (ERCP);  Surgeon: [name], MD;  Location: [facility];  Service: Endoscopy;  Laterality: N/A;   FINGER SURGERY Right    ring finger "fused"   FRACTURE SURGERY     IRRIGATION AND DEBRIDEMENT ABSCESS Right 10/17/2013   Procedure: INCISION AND DRAINAGE RIGHT  SUBCOSTAL WOUND;  Surgeon:  [name], MD;  [facility];  Service: General;  Laterality: Right;   JOINT REPLACEMENT     KNEE ARTHROSCOPY Right 1998   LEFT HEART CATH AND CORS/GRAFTS ANGIOGRAPHY N/A 07/25/2016   Procedure: Left Heart Cath and Cors/Grafts Angiography;  Surgeon: Jettie Booze, MD;  Location: [facility];  Service: Cardiovascular;  Laterality: N/A;   LEFT HEART CATH AND CORS/GRAFTS ANGIOGRAPHY N/A 06/05/2019   Procedure: LEFT HEART CATH AND CORS/GRAFTS ANGIOGRAPHY;  Surgeon: Burnell Blanks, MD;  Location: [facility];  Service: Cardiovascular;  Laterality: N/A;   LEFT HEART CATHETERIZATION WITH CORONARY/GRAFT ANGIOGRAM N/A 07/19/2013   Procedure: LEFT HEART CATHETERIZATION WITH Beatrix Fetters;  Surgeon: Leonie Man, MD;  Location: [facility];  Service: Cardiovascular;  Laterality: N/A;   MASS EXCISION Right 06/25/2014   Procedure: EXCISION BUTTOCK MASS ;  Surgeon: Coralie Keens, MD;  Location: [facility];  Service: General;  Laterality: Right;   ORIF HUMERUS FRACTURE Left 08/27/2013   Procedure: OPEN REDUCTION INTERNAL FIXATION (ORIF) LEFT HUMERUS ;  Surgeon: Rozanna Box, MD;  Location: [facility];  Service: Orthopedics;  Laterality: Left;   PLACEMENT OF IMPELLA LEFT VENTRICULAR ASSIST DEVICE N/A 06/24/2019   Procedure: PLACEMENT OF IMPELLA LEFT VENTRICULAR ASSIST DEVICE;  Surgeon: Wonda Olds, MD;  Location: [facility];  Service: Open Heart Surgery;  Laterality: N/A;   POSTERIOR FUSION CERVICAL SPINE  1992   REMOVAL OF IMPELLA LEFT VENTRICULAR ASSIST DEVICE Right 06/28/2019   Procedure: REMOVAL OF IMPELLA LEFT VENTRICULAR ASSIST DEVICE;  Surgeon: Wonda Olds, MD;  Location: [facility];  Service: Open Heart Surgery;  Laterality: Right;   THROMBECTOMY / EMBOLECTOMY FEMORAL ARTERY Right    hx/notes 03/20/2000   TOTAL HIP ARTHROPLASTY Right 07/19/2016   Procedure: TOTAL HIP ARTHROPLASTY ANTERIOR APPROACH;  Surgeon: [name], MD;  Location: [facility];  Service: Orthopedics;  Laterality: Right;   TOTAL HIP REVISION Right 08/22/2017   Procedure: TOTAL HIP REVISION;  Surgeon: [name], MD;  Location: [facility];  Service: Orthopedics;  Laterality: Right;       Vanessa Kick, MD 11/03/21 1655

## 2021-11-03 NOTE — ED Notes (Signed)
Ortho tech made aware for splint ordered, ETA approx 10 mintues

## 2021-11-10 ENCOUNTER — Other Ambulatory Visit: Payer: Self-pay | Admitting: Cardiovascular Disease

## 2021-11-29 DIAGNOSIS — M25531 Pain in right wrist: Secondary | ICD-10-CM | POA: Diagnosis not present

## 2021-12-09 ENCOUNTER — Other Ambulatory Visit: Payer: Self-pay | Admitting: Family

## 2021-12-09 ENCOUNTER — Other Ambulatory Visit: Payer: Self-pay | Admitting: Cardiovascular Disease

## 2021-12-09 MED ORDER — AMIODARONE HCL 100 MG PO TABS: 100.0000 mg | ORAL_TABLET | Freq: Every morning | ORAL | 0 refills | Status: DC

## 2021-12-09 NOTE — Telephone Encounter (Signed)
Pt of Dr. [name]. Please review for refill. Thank you!

## 2021-12-23 DIAGNOSIS — Z23 Encounter for immunization: Secondary | ICD-10-CM | POA: Diagnosis not present

## 2021-12-23 DIAGNOSIS — I10 Essential (primary) hypertension: Secondary | ICD-10-CM | POA: Diagnosis not present

## 2022-01-05 DIAGNOSIS — I7409 Other arterial embolism and thrombosis of abdominal aorta: Secondary | ICD-10-CM | POA: Diagnosis not present

## 2022-01-05 DIAGNOSIS — G4733 Obstructive sleep apnea (adult) (pediatric): Secondary | ICD-10-CM | POA: Diagnosis not present

## 2022-01-05 DIAGNOSIS — R011 Cardiac murmur, unspecified: Secondary | ICD-10-CM | POA: Diagnosis not present

## 2022-01-05 DIAGNOSIS — D638 Anemia in other chronic diseases classified elsewhere: Secondary | ICD-10-CM | POA: Diagnosis not present

## 2022-01-05 DIAGNOSIS — I480 Paroxysmal atrial fibrillation: Secondary | ICD-10-CM | POA: Diagnosis not present

## 2022-01-05 DIAGNOSIS — M4800 Spinal stenosis, site unspecified: Secondary | ICD-10-CM | POA: Diagnosis not present

## 2022-01-05 DIAGNOSIS — I5042 Chronic combined systolic (congestive) and diastolic (congestive) heart failure: Secondary | ICD-10-CM | POA: Diagnosis not present

## 2022-01-05 DIAGNOSIS — J439 Emphysema, unspecified: Secondary | ICD-10-CM | POA: Diagnosis not present

## 2022-01-05 DIAGNOSIS — R2681 Unsteadiness on feet: Secondary | ICD-10-CM | POA: Diagnosis not present

## 2022-01-05 DIAGNOSIS — I25118 Atherosclerotic heart disease of native coronary artery with other forms of angina pectoris: Secondary | ICD-10-CM | POA: Diagnosis not present

## 2022-01-11 ENCOUNTER — Other Ambulatory Visit: Payer: Self-pay | Admitting: Family Medicine

## 2022-01-11 ENCOUNTER — Encounter: Payer: Self-pay | Admitting: Cardiovascular Disease

## 2022-01-11 ENCOUNTER — Ambulatory Visit: Payer: Medicare Other | Attending: Cardiovascular Disease | Admitting: Cardiovascular Disease

## 2022-01-11 VITALS — BP 170/70 | HR 67 | Ht 62.0 in | Wt 121.6 lb

## 2022-01-11 DIAGNOSIS — Z87891 Personal history of nicotine dependence: Secondary | ICD-10-CM

## 2022-01-11 DIAGNOSIS — I5042 Chronic combined systolic (congestive) and diastolic (congestive) heart failure: Secondary | ICD-10-CM | POA: Diagnosis not present

## 2022-01-11 DIAGNOSIS — I2510 Atherosclerotic heart disease of native coronary artery without angina pectoris: Secondary | ICD-10-CM

## 2022-01-11 DIAGNOSIS — I5043 Acute on chronic combined systolic (congestive) and diastolic (congestive) heart failure: Secondary | ICD-10-CM

## 2022-01-11 DIAGNOSIS — E2839 Other primary ovarian failure: Secondary | ICD-10-CM

## 2022-01-11 DIAGNOSIS — E785 Hyperlipidemia, unspecified: Secondary | ICD-10-CM | POA: Diagnosis not present

## 2022-01-11 DIAGNOSIS — I480 Paroxysmal atrial fibrillation: Secondary | ICD-10-CM

## 2022-01-11 DIAGNOSIS — I25118 Atherosclerotic heart disease of native coronary artery with other forms of angina pectoris: Secondary | ICD-10-CM | POA: Diagnosis not present

## 2022-01-11 DIAGNOSIS — I10 Essential (primary) hypertension: Secondary | ICD-10-CM

## 2022-01-11 DIAGNOSIS — Z1231 Encounter for screening mammogram for malignant neoplasm of breast: Secondary | ICD-10-CM

## 2022-01-11 MED ORDER — ENTRESTO 49-51 MG PO TABS: 1.0000 | ORAL_TABLET | Freq: Two times a day (BID) | ORAL | 3 refills | Status: DC

## 2022-01-11 MED ORDER — ISOSORBIDE MONONITRATE ER 30 MG PO TB24
30.0000 mg | ORAL_TABLET | Freq: Every day | ORAL | 3 refills | Status: DC
Start: 2022-01-11 — End: 2022-01-12

## 2022-01-11 NOTE — Patient Instructions (Signed)
Medication Instructions:  INCREASE Entresto to 49/'51mg'$  twice daily BEGIN Isosorbide mononitrate '30mg'$  daily (in 4 days) *If you need a refill on your cardiac medications before your next appointment, please call your pharmacy*   Lab Work: BMET, Lipids, LFT's, CBC, TSH today If you have labs (blood work) drawn today and your tests are completely normal, you will receive your results only by: [place] (if you have MyChart) OR A paper copy in the mail If you have any lab test that is abnormal or we need to change your treatment, we will call you to review the results.  Testing/Procedures: ECHO Your physician has requested that you have an echocardiogram. Echocardiography is a painless test that uses sound waves to create images of your heart. It provides your doctor with information about the size and shape of your heart and how well your heart's chambers and valves are working. This procedure takes approximately one hour. There are no restrictions for this procedure. Please do NOT wear cologne, perfume, aftershave, or lotions (deodorant is allowed). Please arrive 15 minutes prior to your appointment time.  Follow-Up: At [facility], you and your health needs are our priority.  As part of our continuing mission to provide you with exceptional heart care, we have created designated Provider Care Teams.  These Care Teams include your primary Cardiologist (physician) and Advanced Practice Providers (APPs -  Physician Assistants and Nurse Practitioners) who all work together to provide you with the care you need, when you need it.  We recommend signing up for the patient portal called "MyChart".  Sign up information is provided on this After Visit Summary.  MyChart is used to connect with patients for Virtual Visits (Telemedicine).  Patients are able to view lab/test results, encounter notes, upcoming appointments, etc.  Non-urgent messages can be sent to your provider as well.   To  learn more about what you can do with MyChart, go to [email or web address].    Your next appointment:   1 month(s)  The format for your next appointment:   In Person  Provider:   Richardson Dopp, PA    Important Information About Sugar

## 2022-01-11 NOTE — Progress Notes (Signed)
Cardiology Office Note   Date:  01/11/2022   ID:  Wendy [name], DOB 07/10/1950, MRN [id number]  PCP:  Josetta Huddle, MD  Cardiologist:   [name], MD   No chief complaint on file.  Problem List 1. Unstable angina (HCC) - history of CAD s/p CABG SVG-OM, LIMA-LAD, SVG-RCA in 1998, PCI in 2013. She underwent cardiac catheterization here in 07/2013 which showed an atretic LIMA-LAD with no significant disease in the native LAD, patent SVG-OM with patent stent and patent SVG-distRCA.   S/p PCI of native RCA ( within the stent )   2. Paroxysmal Atrial Fibrillation  CHADS2VASC = 4   ( female, age 67, CAD,HTN ) 3. Hypertension -    4. HLD (hyperlipidemia)  5. COPD (chronic obstructive pulmonary disease) ([place])  Dec. 2, 2016:  Wendy [name] is a 71 y.o. female who presents for follow up of recent hospitalization for chest pain, abnormal blood pressure and PCI of the right coronary artery. Since the discharge she's not been feeling well.   After the stent, she felt very well for a week or so . Then she started Eliquis after she got home. Has felt poorly since that  No bleeding in stool. Thinks the Eliquis is causing her headaches, diarrhea, feels poorly all over.   Symptoms sound like flu like to me .  Wants to sleep all the time  Has not been to the medical doctor .  Has been very vatigued - complete lack of energy for the past week.   No CP , breathing is ok  No focal neurological changes.   August 13, 2015:  Wendy [name] is in atrial fib today .   Is having some generalized weakness and dyspnea.  Feels poorly all the time.   Is anemic,  Has been taking all of her meds But did not take them this am  Does not necessarily feel worse when she is in atrial fib   .she feesl poorly all the time ! Is going for colonoscopy soon   02/11/2016:  Is in atrial fib today  Just started today .   Has not seen the A-Fib clinic  needs to have hip replacement  surgery and back  injections or surgery   Oct. 1, 2018  Wendy [name] is seen today for follow up visit Had a recent episode of CP , resolved with 1 SL NTG .  Was admitted in June with NSTEMI - + for cocaine at that time  Still has occasional episodes of CP, takes NTG wit relief. Exercising  1-2 times a week,  Is on Ranexa 1000 PO BID and Imdur 60 mg a day  Smokes marijuana  Frequently  Avoids salty foods for the most part.   July 03, 2017:  Wendy [name] was last seen I Jan 06, 2017  Her daughter died a month ago of respiratory complications - age 65.  Occasional episodes of CP ,  Relieved  Is not smoking  Takes NTG regularly  - perhaps 2-3 times a day , sometimes none .  She is having lots of back problems.  Her surgeon has suggested may be a back stimulator.  And wanted by input on whether or not she would be a candidate for this.  I think that she would be at perhaps moderate risk for cardiovascular complications.  Her angina pattern is very stable and she is not having any worsening angina so I think that the risks are acceptable at this point.  January 11, 2022:  Wendy [name] is seen for follow-up of her coronary artery disease, congestive heart failure, peripheral arterial disease, atrial fibrillation, polysubstance abuse  She was seen 1 year ago in our office by Laurann Montana, NP.  Does not feel well at all   General lack of energy Diarrhea Been in bed for days   Her last echocardiogram is from July, 2021.  Revealed mildly depressed left ventricular systolic function with ejection fraction of 45 to 50%.  She has grade 1 diastolic dysfunction. Trivial mitral regurgitation  Has some bilateral jaw pain , Occasional angina like cp  Takes NTG with relief.   Last heart catheterization was in May, 2021.  She has occluded grafts.  She has an occluded right coronary artery, obtuse marginal and distal circumflex artery.  The LAD has a 60% stenosis.  There were no interventional targets. Very little exercise   Poor  appetite .   No abdominal pain with eating .    Has been seen in the advanced CHF clinic by Bensimhon and Aundra Dubin   Past Medical History:  Diagnosis Date   Anemia    Anginal pain ([place])    jaw pain 07/2016, s/p 07/15/16 nuclear stress test   Anxiety    Arthritis    "lower back" (08/24/2017)   Asthma    [place] Obstructive Pulmonary Disease    Chronic back pain    Chronic combined systolic and diastolic CHF    EF 7/42: 59-56 // EF 4/21: 55-60 // EF 4/21: 20-25   Chronic leg pain    Chronic lower back pain    Coronary Artery Disease    a. s/p NSTEMI >> CABG 1998 // s/p PCI 2013 // [place] 01/2015: DES to SVG-RCA.// Cath 05/2019: all grafts occluded; mod non-obs LAD disease w L-L and L-R collats - med Rx // post Ao-Bifem bypass MI c/b PEA, CGS requiring Impella >> anatomy unchanged - Med Rx   Depression    Fall 08/2016   Gastroesophageal reflux disease    Glucose intolerance    Headache    "a few/month" (08/24/2017)   HLD (hyperlipidemia)    Hx of DVT X 3   RLE   Hypertension    Myocardial infarction ([place]) 1999   OSA on CPAP    Paroxysmal atrial fibrillation    a. 09/2013 post-op b. 01/2015 // Apixaban // recurrent AFib post MI after Ao-Bifem in 06/2019 >> Amio Rx   Peripheral artery disease    s/p L fem-fem bypass // s/p Ao-Bifem bypass in 06/2019   Peripheral neuropathy    Pneumonia    Shortness of breath    when walking   Wears glasses     Past Surgical History:  Procedure Laterality Date   ABDOMINAL AORTOGRAM W/LOWER EXTREMITY Bilateral 04/29/2019   Procedure: ABDOMINAL AORTOGRAM W/LOWER EXTREMITY;  Surgeon: Waynetta Sandy, MD;  Location: [facility];  Service: Cardiovascular;  Laterality: Bilateral;   ANTERIOR CERVICAL DECOMP/DISCECTOMY FUSION  1991   AORTA - BILATERAL FEMORAL ARTERY BYPASS GRAFT N/A 06/18/2019   Procedure: AORTA BIFEMORAL BYPASS GRAFT;  Surgeon: Waynetta Sandy, MD;  Location: [facility];  Service: Vascular;  Laterality: N/A;   ARTERY  REPAIR Left 06/30/2019   Procedure: LEFT BRACHIAL ARTERY REPAIR, EVACUATION OF HEMATOMA;  Surgeon: Serafina Mitchell, MD;  Location: [facility];  Service: Vascular;  Laterality: Left;   BACK SURGERY     CARDIAC CATHETERIZATION N/A 01/22/2015   Procedure: Left Heart Cath and Coronary Angiography;  Surgeon: Troy Sine, MD;  Location: [facility];  Service: Cardiovascular;  Laterality: N/A;   CARDIAC CATHETERIZATION N/A 01/22/2015   Procedure: Coronary Stent Intervention;  Surgeon: Troy Sine, MD;  Location: [facility];  Service: Cardiovascular;  Laterality: N/A;  3.0x12 xience prox SVG to RCA   CARDIAC CATHETERIZATION  03/09/2000   hx/notes 03/20/2000   CHOLECYSTECTOMY N/A 09/16/2013   Procedure: LAPAROSCOPIC CHOLECYSTECTOMY CONVERTED TO OPEN CHOLECYSTECTOMY WITH CHOLANGIOGRAM;  Surgeon: [name], MD;  Location: [facility];  Service: General;  Laterality: N/A;   CORONARY ANGIOGRAPHY N/A 06/27/2019   Procedure: CORONARY ANGIOGRAPHY (CATH LAB);  Surgeon: Sherren Mocha, MD;  Location: [facility];  Service: Cardiovascular;  Laterality: N/A;   CORONARY ANGIOPLASTY WITH STENT PLACEMENT  11/2011, 02/2012, 01/2015   DES to SVG-RCA both times, In [place], [place], Dr. Bruce Donath   CORONARY ARTERY BYPASS GRAFT  1998   LIMA-LAD, SVG-OM, SVG-RCA   ERCP N/A 09/19/2013   Procedure: ENDOSCOPIC RETROGRADE CHOLANGIOPANCREATOGRAPHY (ERCP);  Surgeon: [name], MD;  Location: [facility];  Service: Endoscopy;  Laterality: N/A;   FINGER SURGERY Right    ring finger "fused"   FRACTURE SURGERY     IRRIGATION AND DEBRIDEMENT ABSCESS Right 10/17/2013   Procedure: INCISION AND DRAINAGE RIGHT SUBCOSTAL WOUND;  Surgeon: [name], MD;  Location: [facility];  Service: General;  Laterality: Right;   JOINT REPLACEMENT     KNEE ARTHROSCOPY Right 1998   LEFT HEART CATH AND CORS/GRAFTS ANGIOGRAPHY N/A 07/25/2016   Procedure: Left Heart Cath and Cors/Grafts Angiography;  Surgeon: Jettie Booze, MD;  Location: [facility];  Service: Cardiovascular;  Laterality: N/A;   LEFT HEART CATH AND CORS/GRAFTS ANGIOGRAPHY N/A 06/05/2019   Procedure: LEFT HEART CATH AND CORS/GRAFTS ANGIOGRAPHY;  Surgeon: Burnell Blanks, MD;  Location: [facility];  Service: Cardiovascular;  Laterality: N/A;   LEFT HEART CATHETERIZATION WITH CORONARY/GRAFT ANGIOGRAM N/A 07/19/2013   Procedure: LEFT HEART CATHETERIZATION WITH Beatrix Fetters;  Surgeon: Leonie Man, MD;  Location: [facility];  Service: Cardiovascular;  Laterality: N/A;   MASS EXCISION Right 06/25/2014   Procedure: EXCISION BUTTOCK MASS ;  Surgeon: Coralie Keens, MD;  Location: [facility];  Service: General;  Laterality: Right;   ORIF HUMERUS FRACTURE Left 08/27/2013   Procedure: OPEN REDUCTION INTERNAL FIXATION (ORIF) LEFT HUMERUS ;  Surgeon: Rozanna Box, MD;  Location: [facility];  Service: Orthopedics;  Laterality: Left;   PLACEMENT OF IMPELLA LEFT VENTRICULAR ASSIST DEVICE N/A 06/24/2019   Procedure: PLACEMENT OF IMPELLA LEFT VENTRICULAR ASSIST DEVICE;  Surgeon: Wonda Olds, MD;  Location: [facility];  Service: Open Heart Surgery;  Laterality: N/A;   POSTERIOR FUSION CERVICAL SPINE  1992   REMOVAL OF IMPELLA LEFT VENTRICULAR ASSIST DEVICE Right 06/28/2019   Procedure: REMOVAL OF IMPELLA LEFT VENTRICULAR ASSIST DEVICE;  Surgeon: Wonda Olds, MD;  Location: [facility];  Service: Open Heart Surgery;  Laterality: Right;   THROMBECTOMY / EMBOLECTOMY FEMORAL ARTERY Right    hx/notes 03/20/2000   TOTAL HIP ARTHROPLASTY Right 07/19/2016   Procedure: TOTAL HIP ARTHROPLASTY ANTERIOR APPROACH;  Surgeon: [name], MD;  Location: [facility];  Service: Orthopedics;  Laterality: Right;   TOTAL HIP REVISION Right 08/22/2017   Procedure: TOTAL HIP REVISION;  Surgeon: [name], MD;  Location: [facility];  Service: Orthopedics;  Laterality: Right;     Current Outpatient Medications  Medication Sig  Dispense Refill   acetaminophen (TYLENOL) 500 MG tablet  Take 500-1,000 mg by mouth every 6 (six) hours as needed (for pain.).     albuterol (PROVENTIL HFA;VENTOLIN HFA) 108 (90 Base) MCG/ACT inhaler Inhale 2 puffs into the lungs every 6 (six) hours as needed for wheezing or shortness of breath. 1 each 1   albuterol (PROVENTIL) (2.5 MG/3ML) 0.083% nebulizer solution Inhale 2.5 mg into the lungs 3 (three) times daily as needed for shortness of breath or wheezing.     ALPRAZolam (XANAX) 0.5 MG tablet Take 0.5 mg by mouth 3 (three) times daily as needed for anxiety.     amiodarone (PACERONE) 100 MG tablet Take 1 tablet (100 mg total) by mouth every morning. 15 tablet 0   apixaban (ELIQUIS) 5 MG TABS tablet Take 1 tablet (5 mg total) by mouth 2 (two) times daily. 180 tablet 1   aspirin 81 MG chewable tablet Chew 1 tablet (81 mg total) by mouth daily.     atorvastatin (LIPITOR) 80 MG tablet TAKE ONE TABLET BY MOUTH ONCE DAILY Needs appointment for further refills 15 tablet 0   buPROPion (WELLBUTRIN XL) 150 MG 24 hr tablet Take 150 mg by mouth daily.     diclofenac Sodium (VOLTAREN) 1 % GEL Apply 1 g topically 4 (four) times daily as needed for pain.     docusate sodium (COLACE) 100 MG capsule Take 100 mg by mouth daily as needed for mild constipation.     ezetimibe (ZETIA) 10 MG tablet Take 10 mg by mouth daily.     furosemide (LASIX) 20 MG tablet Take 20 mg by mouth as needed for fluid or edema.     isosorbide mononitrate (IMDUR) 30 MG 24 hr tablet Take 1 tablet (30 mg total) by mouth daily. 90 tablet 3   nitroGLYCERIN (NITROSTAT) 0.4 MG SL tablet Place 1 tablet (0.4 mg total) under the tongue every 5 (five) minutes as needed for chest pain. 25 tablet 2   oxyCODONE 10 MG TABS Take 1 tablet (10 mg total) by mouth 3 (three) times daily. (Patient taking differently: Take 10 mg by mouth 4 (four) times daily as needed (pain).) 30 tablet 0   pantoprazole (PROTONIX) 40 MG tablet TAKE ONE TABLET BY MOUTH ONCE  DAILY 15 tablet 0   pramipexole (MIRAPEX) 0.25 MG tablet Take 0.25 mg by mouth at bedtime.      promethazine (PHENERGAN) 12.5 MG tablet TAKE ONE TABLET BY MOUTH EVERY 6 HOURS AS NEEDED FOR NAUSEA AND VOMITING (Patient taking differently: Take 12.5 mg by mouth every 6 (six) hours.) 30 tablet 3   ranolazine (RANEXA) 1000 MG SR tablet Take 1,000 mg by mouth 2 (two) times daily.     sacubitril-valsartan (ENTRESTO) 49-51 MG Take 1 tablet by mouth 2 (two) times daily. 180 tablet 3   sertraline (ZOLOFT) 100 MG tablet Take 150 mg by mouth at bedtime.      gabapentin (NEURONTIN) 300 MG capsule Take 300 mg by mouth 3 (three) times daily. (Patient not taking: Reported on 01/11/2022)     melatonin 5 MG TABS Take 5 mg by mouth at bedtime as needed (sleep). (Patient not taking: Reported on 01/11/2022)     Current Facility-Administered Medications  Medication Dose Route Frequency Provider Last Rate Last Admin   ferrous sulfate tablet 325 mg  325 mg Oral BID WC Irene Shipper, MD        Allergies:   Benadryl [diphenhydramine], Penicillins, Adhesive [tape], Amoxicillin, Hydrocodone-acetaminophen, Cyclobenzaprine, and Hydrocodone    Social History:  The patient  reports that  she quit smoking about 23 years ago. Her smoking use included cigarettes. She has a 17.00 pack-year smoking history. She has never used smokeless tobacco. She reports that she does not currently use alcohol. She reports current drug use. Drug: Marijuana.   Family History:  The patient's family history includes Clotting disorder in her father; Heart disease in her brother and brother; Lung cancer in her mother and sister.    ROS:   Noted in current history, otherwise systems are negative.   Physical Exam: Blood pressure (!) 170/70, pulse 67, height '5\' 2"'$  (1.575 m), weight 121 lb 9.6 oz (55.2 kg), SpO2 95 %.  HYPERTENSION CONTROL Vitals:   01/11/22 1450 01/11/22 1525  BP: (!) 158/76 (!) 170/70    The patient's blood pressure is  elevated above target today.  In order to address the patient's elevated BP: A new medication was prescribed today.       GEN:  Well nourished, well developed in no acute distress HEENT: Normal NECK: No JVD; No carotid bruits LYMPHATICS: No lymphadenopathy CARDIAC: RRR , no murmurs, rubs, gallops RESPIRATORY:  Clear to auscultation without rales, wheezing or rhonchi  ABDOMEN: Soft, non-tender, non-distended MUSCULOSKELETAL:  No edema; No deformity  SKIN: Warm and dry NEUROLOGIC:  Alert and oriented x 3      EKG: January 11, 2022: Normal sinus rhythm at 67.  Inferior wall myocardial infarction.  Previous anterior wall myocardial infarction.   Recent Labs: No results found for requested labs within last 365 days.    Lipid Panel    Component Value Date/Time   CHOL 98 (L) 12/05/2016 1139   TRIG 73 12/05/2016 1139   HDL 43 12/05/2016 1139   CHOLHDL 2.3 12/05/2016 1139   CHOLHDL 4.1 08/13/2015 0939   VLDL 48 (H) 08/13/2015 0939   LDLCALC 40 12/05/2016 1139      Wt Readings from Last 3 Encounters:  01/11/22 121 lb 9.6 oz (55.2 kg)  11/03/21 120 lb 3.2 oz (54.5 kg)  11/16/20 150 lb (68 kg)      Other studies Reviewed: Additional studies/ records that were reviewed today include: . Review of the above records demonstrates:    ASSESSMENT AND PLAN:  1. Coronary artery disease:  Cath in May, 2018 shows Attretic LIMA to LAD, occluded SVG to OM, occluded SVG to PDA.  She has  severe coronary artery disease.       2. Paroxysmal atrial fibrillation:    on amiodarone.   Check TSH today  , maintaining NSR today   3.   Chronic combined CHF:   BP is a bit elevated.   Its difficult to tell if some of this is stress related.  Her HOme reading are not as nigh  Add spironolactone 25 mg a day BMP in 2-3 weeks Will have her follow up with [place] in 1 month If her BP remains elevated, we will increase her entresto    Current medicines are reviewed at length with the patient  today.  The patient does not have concerns regarding medicines.  The following changes have been made:  no change  Labs/ tests ordered today include:   Orders Placed This Encounter  Procedures   Basic metabolic panel   Lipid panel   Hepatic function panel   CBC   TSH   EKG 12-Lead   ECHOCARDIOGRAM COMPLETE    Disposition:      [name], MD  01/11/2022 3:45 PM    [facility] [address], [place], [place]  [address] Phone: [phone]; Fax: [phone]

## 2022-01-12 ENCOUNTER — Telehealth: Payer: Self-pay | Admitting: Cardiovascular Disease

## 2022-01-12 ENCOUNTER — Other Ambulatory Visit: Payer: Self-pay | Admitting: Cardiovascular Disease

## 2022-01-12 DIAGNOSIS — E785 Hyperlipidemia, unspecified: Secondary | ICD-10-CM

## 2022-01-12 DIAGNOSIS — I5042 Chronic combined systolic (congestive) and diastolic (congestive) heart failure: Secondary | ICD-10-CM

## 2022-01-12 DIAGNOSIS — I480 Paroxysmal atrial fibrillation: Secondary | ICD-10-CM

## 2022-01-12 DIAGNOSIS — Z79899 Other long term (current) drug therapy: Secondary | ICD-10-CM

## 2022-01-12 DIAGNOSIS — I25118 Atherosclerotic heart disease of native coronary artery with other forms of angina pectoris: Secondary | ICD-10-CM

## 2022-01-12 DIAGNOSIS — I10 Essential (primary) hypertension: Secondary | ICD-10-CM

## 2022-01-12 LAB — LIPID PANEL
Chol/HDL Ratio: 2.3 ratio (ref 0.0–4.4)
Cholesterol, Total: 94 mg/dL — ABNORMAL LOW (ref 100–199)
HDL: 41 mg/dL (ref >39)
LDL Chol Calc (NIH): 33 mg/dL (ref 0–99)
Triglycerides: 105 mg/dL (ref 0–149)
VLDL Cholesterol Cal: 20 mg/dL (ref 5–40)

## 2022-01-12 LAB — BASIC METABOLIC PANEL
BUN/Creatinine Ratio: 16 (ref 12–28)
BUN: 12 mg/dL (ref 8–27)
CO2: 24 mmol/L (ref 20–29)
Calcium: 8.6 mg/dL — ABNORMAL LOW (ref 8.7–10.3)
Chloride: 102 mmol/L (ref 96–106)
Creatinine, Ser: 0.74 mg/dL (ref 0.57–1.00)
Glucose: 84 mg/dL (ref 70–99)
Potassium: 3.3 mmol/L — ABNORMAL LOW (ref 3.5–5.2)
Sodium: 144 mmol/L (ref 134–144)
eGFR: 87 mL/min/{1.73_m2} (ref >59)

## 2022-01-12 LAB — CBC
Hematocrit: 33.6 % — ABNORMAL LOW (ref 34.0–46.6)
Hemoglobin: 11.1 g/dL (ref 11.1–15.9)
MCH: 30.2 pg (ref 26.6–33.0)
MCHC: 33 g/dL (ref 31.5–35.7)
MCV: 92 fL (ref 79–97)
Platelets: 171 10*3/uL (ref 150–450)
RBC: 3.67 x10E6/uL — ABNORMAL LOW (ref 3.77–5.28)
RDW: 13.1 % (ref 11.7–15.4)
WBC: 4.3 10*3/uL (ref 3.4–10.8)

## 2022-01-12 LAB — HEPATIC FUNCTION PANEL
ALT: 9 IU/L (ref 0–32)
AST: 15 IU/L (ref 0–40)
Albumin: 3.7 g/dL — ABNORMAL LOW (ref 3.9–4.9)
Alkaline Phosphatase: 91 IU/L (ref 44–121)
Bilirubin Total: 0.4 mg/dL (ref 0.0–1.2)
Bilirubin, Direct: 0.19 mg/dL (ref 0.00–0.40)
Total Protein: 6.4 g/dL (ref 6.0–8.5)

## 2022-01-12 LAB — TSH: TSH: 1.95 u[IU]/mL (ref 0.450–4.500)

## 2022-01-12 MED ORDER — FUROSEMIDE 20 MG PO TABS: 20.0000 mg | ORAL_TABLET | ORAL | 1 refills | Status: AC | PRN

## 2022-01-12 MED ORDER — APIXABAN 5 MG PO TABS: 5.0000 mg | ORAL_TABLET | Freq: Two times a day (BID) | ORAL | 3 refills | Status: DC

## 2022-01-12 MED ORDER — PANTOPRAZOLE SODIUM 40 MG PO TBEC: 40.0000 mg | DELAYED_RELEASE_TABLET | Freq: Every day | ORAL | 3 refills | Status: DC

## 2022-01-12 MED ORDER — ATORVASTATIN CALCIUM 80 MG PO TABS: 80.0000 mg | ORAL_TABLET | Freq: Every day | ORAL | 3 refills | Status: AC

## 2022-01-12 MED ORDER — RANOLAZINE ER 1000 MG PO TB12: 1000.0000 mg | ORAL_TABLET | Freq: Two times a day (BID) | ORAL | 3 refills | Status: DC

## 2022-01-12 MED ORDER — ASPIRIN 81 MG PO CHEW: 81.0000 mg | CHEWABLE_TABLET | Freq: Every day | ORAL | Status: DC

## 2022-01-12 MED ORDER — ISOSORBIDE MONONITRATE ER 30 MG PO TB24: 30.0000 mg | ORAL_TABLET | Freq: Every day | ORAL | 3 refills | Status: AC

## 2022-01-12 MED ORDER — SPIRONOLACTONE 25 MG PO TABS: 25.0000 mg | ORAL_TABLET | Freq: Every day | ORAL | 3 refills | Status: DC

## 2022-01-12 NOTE — Addendum Note (Signed)
Addended by: Ma Hillock on: 01/12/2022 05:09 PM   Modules accepted: Orders

## 2022-01-12 NOTE — Telephone Encounter (Signed)
Patient called and she states that she needs refills on her cardiac meds sent to pharmacy on file. Pt seen, so will update meds per request.

## 2022-01-12 NOTE — Telephone Encounter (Signed)
Called and spoke with patient who understands and agrees to plan to begin Spironolactone daily. BMET scheduled for 02/03/22.

## 2022-01-12 NOTE — Telephone Encounter (Signed)
Patient is following up requesting to speak with RN regarding medication change again. She also mentions she needs one of her prescriptions increased from 1 15 day supply to 90 days, but she does not remember the name of it.

## 2022-01-12 NOTE — Telephone Encounter (Signed)
-----   Message from Thayer Headings, MD sent at 01/12/2022  1:20 PM EST ----- BP was high Potassium is low Add spironolactone 25 mg a day  BMP in 2-3 weeks She will follow up with Richardson Dopp in a month.

## 2022-01-17 DIAGNOSIS — M549 Dorsalgia, unspecified: Secondary | ICD-10-CM | POA: Diagnosis not present

## 2022-01-17 DIAGNOSIS — G44209 Tension-type headache, unspecified, not intractable: Secondary | ICD-10-CM | POA: Diagnosis not present

## 2022-01-17 DIAGNOSIS — G894 Chronic pain syndrome: Secondary | ICD-10-CM | POA: Diagnosis not present

## 2022-01-17 DIAGNOSIS — I10 Essential (primary) hypertension: Secondary | ICD-10-CM | POA: Diagnosis not present

## 2022-01-17 DIAGNOSIS — R5382 Chronic fatigue, unspecified: Secondary | ICD-10-CM | POA: Diagnosis not present

## 2022-01-17 DIAGNOSIS — G629 Polyneuropathy, unspecified: Secondary | ICD-10-CM | POA: Diagnosis not present

## 2022-01-17 DIAGNOSIS — I480 Paroxysmal atrial fibrillation: Secondary | ICD-10-CM | POA: Diagnosis not present

## 2022-01-26 ENCOUNTER — Other Ambulatory Visit (HOSPITAL_COMMUNITY): Payer: Medicare Other

## 2022-02-02 DIAGNOSIS — M25531 Pain in right wrist: Secondary | ICD-10-CM | POA: Diagnosis not present

## 2022-02-02 DIAGNOSIS — G5601 Carpal tunnel syndrome, right upper limb: Secondary | ICD-10-CM | POA: Diagnosis not present

## 2022-02-02 DIAGNOSIS — G5602 Carpal tunnel syndrome, left upper limb: Secondary | ICD-10-CM | POA: Diagnosis not present

## 2022-02-02 DIAGNOSIS — G5603 Carpal tunnel syndrome, bilateral upper limbs: Secondary | ICD-10-CM | POA: Diagnosis not present

## 2022-02-03 ENCOUNTER — Other Ambulatory Visit: Payer: Medicare Other

## 2022-02-03 ENCOUNTER — Ambulatory Visit (HOSPITAL_COMMUNITY): Payer: Medicare Other | Attending: Cardiovascular Disease

## 2022-02-03 DIAGNOSIS — I10 Essential (primary) hypertension: Secondary | ICD-10-CM | POA: Diagnosis not present

## 2022-02-03 DIAGNOSIS — E785 Hyperlipidemia, unspecified: Secondary | ICD-10-CM

## 2022-02-03 DIAGNOSIS — I480 Paroxysmal atrial fibrillation: Secondary | ICD-10-CM | POA: Diagnosis not present

## 2022-02-03 DIAGNOSIS — I5042 Chronic combined systolic (congestive) and diastolic (congestive) heart failure: Secondary | ICD-10-CM | POA: Diagnosis not present

## 2022-02-03 DIAGNOSIS — I25118 Atherosclerotic heart disease of native coronary artery with other forms of angina pectoris: Secondary | ICD-10-CM | POA: Diagnosis not present

## 2022-02-03 LAB — ECHOCARDIOGRAM COMPLETE
Area-P 1/2: 3.03 cm2
S' Lateral: 3 cm

## 2022-02-15 ENCOUNTER — Ambulatory Visit: Payer: Medicare Other | Attending: Physician Assistant | Admitting: Physician Assistant

## 2022-02-15 ENCOUNTER — Encounter: Payer: Self-pay | Admitting: Physician Assistant

## 2022-02-15 VITALS — BP 118/60 | HR 68 | Ht 62.0 in | Wt 123.8 lb

## 2022-02-15 DIAGNOSIS — J449 Chronic obstructive pulmonary disease, unspecified: Secondary | ICD-10-CM

## 2022-02-15 DIAGNOSIS — I10 Essential (primary) hypertension: Secondary | ICD-10-CM

## 2022-02-15 DIAGNOSIS — E7800 Pure hypercholesterolemia, unspecified: Secondary | ICD-10-CM

## 2022-02-15 DIAGNOSIS — I5032 Chronic diastolic (congestive) heart failure: Secondary | ICD-10-CM

## 2022-02-15 DIAGNOSIS — I480 Paroxysmal atrial fibrillation: Secondary | ICD-10-CM

## 2022-02-15 DIAGNOSIS — I25119 Atherosclerotic heart disease of native coronary artery with unspecified angina pectoris: Secondary | ICD-10-CM

## 2022-02-15 DIAGNOSIS — R0602 Shortness of breath: Secondary | ICD-10-CM

## 2022-02-15 NOTE — Patient Instructions (Signed)
Medication Instructions:  Your physician recommends that you continue on your current medications as directed. Please refer to the Current Medication list given to you today.  *If you need a refill on your cardiac medications before your next appointment, please call your pharmacy*   Lab Work: TODAY:  BMET & PRO BNP  If you have labs (blood work) drawn today and your tests are completely normal, you will receive your results only by: [place] (if you have MyChart) OR A paper copy in the mail If you have any lab test that is abnormal or we need to change your treatment, we will call you to review the results.   Testing/Procedures: A chest x-ray takes a picture of the organs and structures inside the chest, including the heart, lungs, and blood vessels. This test can show several things, including, whether the heart is enlarges; whether fluid is building up in the lungs; and whether pacemaker / defibrillator leads are still in place. Go today to before 4:30 to:  [place] [address]     Your physician recommends that you schedule a follow-up appointment in: 4-6 WEEKS FOR A NURSE VISIT FOR BLOOD PRESSURE CHECK.  MAKE SURE TO BRING YOUR BLOOD PRESSURE MACHINE WITH YOU.  Follow-Up: At [facility], you and your health needs are our priority.  As part of our continuing mission to provide you with exceptional heart care, we have created designated Provider Care Teams.  These Care Teams include your primary Cardiologist (physician) and Advanced Practice Providers (APPs -  Physician Assistants and Nurse Practitioners) who all work together to provide you with the care you need, when you need it.  We recommend signing up for the patient portal called "MyChart".  Sign up information is provided on this After Visit Summary.  MyChart is used to connect with patients for Virtual Visits (Telemedicine).  Patients are able to view lab/test results, encounter  notes, upcoming appointments, etc.  Non-urgent messages can be sent to your provider as well.   To learn more about what you can do with MyChart, go to [email or web address].    Your next appointment:   6 month(s)  The format for your next appointment:   In Person  Provider:   [name], MD  or Richardson Dopp, PA-C         Other Instructions Your physician has requested that you regularly monitor and record your blood pressure readings at home. Please use the same machine at the same time of day to check your readings and record them to bring to your follow-up visit.   Please monitor blood pressures and keep a log of your readings, BRING WITH YOU TO THE NURSE VISIT ALONG WITH YOUR BLOOD PRESSURE MONITOR.   Make sure to check 2 hours after your medications.    AVOID these things for 30 minutes before checking your blood pressure: No Drinking caffeine. No Drinking alcohol. No Eating. No Smoking. No Exercising.   Five minutes before checking your blood pressure: Pee. Sit in a dining chair. Avoid sitting in a soft couch or armchair. Be quiet. Do not talk   Important Information About Sugar

## 2022-02-15 NOTE — Assessment & Plan Note (Signed)
As noted, I suspect her shortness of breath is multifactorial. She used to see pulmonology in [place]. She uses albuterol as needed. If BNP and CXR normal, consider referral to pulmonology.

## 2022-02-15 NOTE — Assessment & Plan Note (Signed)
BP is at target today. She notes some really low BPs at home and some really high readings. Question if her machine is accurate. Continue Imdur 30 mg once daily, Entresto 49/51 mg twice daily, spironolactone 25 mg once daily. BMET today. Monitor BP over several weeks. Arrange RN visit to check BP in 4-6 weeks.

## 2022-02-15 NOTE — Progress Notes (Addendum)
Cardiology Office Note:    Date:  02/15/2022   ID:  Wendy [name], DOB 07-Jul-1950, MRN [id number]  PCP:  Wendy Huddle, MD  [facility] Cardiologist:  [name], MD     Referring MD: Wendy Huddle, MD   Chief Complaint:  F/u for CAD, CHF, AFib    Patient Profile: Coronary artery disease  NSTEMI 1998 s/p CABG // S/p PCI in 2013 // S/p DES to [place] in 01/2015 All grafts occluded - Cath in 05/2019 Aorto-Bifem bypass in 06/2019 c/b MI/PEA/CGS >> no ? on cath [facility] 06/27/2019: LAD proximal 60, D1 100 CTO; LCx distal 80, OM1 stent 100 CTO, OM3 100 CTO; RCA proximal 100 CTO // SVG-RPDA, SVG-OM1, LIMA-LAD all occluded (HFpEF) heart failure with preserved ejection fraction TTE 4/21: EF 55-60 TTE 4/21: EF 20-25 (after post op MI) TTE 09/12/19: EF 45-50  TTE 02/03/2022: EF 55-60, no RWMA, low normal RVSF, trivial MR, AV sclerosis without stenosis, RAP 3 Paroxysmal atrial fibrillation  Post op 06/2019 >> Amio Rx  Peripheral arterial disease  S/p L Fem-Fem bypass // S/p Ao-Bifem bypass 06/2019  Carotid stenosis  [place] 02/23/21: Bilat ICA 1-39  Chronic Obstructive Pulmonary Disease Hypertension  Hyperlipidemia  Hx of DVT  OSA on CPAP Hx of polysubstance abuse    History of Present Illness:   Wendy [name] is a 71 y.o. female with the above problem list.  She was last seen by Dr. [name] 01/11/2022.  She was started on spironolactone for elevated blood pressure.  Follow-up echocardiogram demonstrated improved LV function with EF 55-60.  She returns for follow-up. She is here alone. She has not had chest pain, syncope, leg edema. She sleeps on an incline. She has not had paroxysmal nocturnal dyspnea. She notes significant shortness of breath with minimal exertion. This has been chronic for years w/o change. She used to see pulmonology in [place]. She does not currently smoke. She notes BPs at home in 60s/40s up to the 170s/. She feels weak at times.   EKG:  not done     Reviewed and updated this encounter:   Tobacco  Allergies  Meds  Problems  Med Hx  Surg Hx  Fam Hx     Review of Systems  Gastrointestinal:  Negative for hematochezia and melena.    Labs/Other Test Reviewed:   Recent Labs: 01/11/2022: ALT 9; BUN 12; Creatinine, Ser 0.74; Hemoglobin 11.1; Platelets 171; Potassium 3.3; Sodium 144; TSH 1.950  Recent Lipid Panel Recent Labs    01/11/22 1546  CHOL 94*  TRIG 105  HDL 41  LDLCALC 33    Risk Assessment/Calculations/Metrics:    CHA2DS2-VASc Score = 5   This indicates a 7.2% annual risk of stroke. The patient's score is based upon: CHF History: 1 HTN History: 1 Diabetes History: 0 Stroke History: 0 Vascular Disease History: 1 Age Score: 1 Gender Score: 1            Physical Exam:   VS:  BP 118/60   Pulse 68   Ht '5\' 2"'$  (1.575 m)   Wt 123 lb 12.8 oz (56.2 kg)   SpO2 93%   BMI 22.64 kg/m    Wt Readings from Last 3 Encounters:  02/15/22 123 lb 12.8 oz (56.2 kg)  01/11/22 121 lb 9.6 oz (55.2 kg)  11/03/21 120 lb 3.2 oz (54.5 kg)    Constitutional:      Appearance: Healthy appearance. Not in distress.  Neck:     Vascular: No JVR.  JVD normal.  Pulmonary:     Effort: Pulmonary effort is normal.     Breath sounds: No wheezing. No rales.  Cardiovascular:     Normal rate. Regular rhythm. Normal S1. Normal S2.      Murmurs: There is no murmur.  Edema:    Peripheral edema absent.  Abdominal:     Palpations: There is no hepatomegaly.       ASSESSMENT & PLAN:   Essential hypertension BP is at target today. She notes some really low BPs at home and some really high readings. Question if her machine is accurate. Continue Imdur 30 mg once daily, Entresto 49/51 mg twice daily, spironolactone 25 mg once daily. BMET today. Monitor BP over several weeks. Arrange RN visit to check BP in 4-6 weeks.   HLD (hyperlipidemia) LDL optimal on most recent lab work.  Continue current Rx with Zetia 10 mg once daily, Lipitor 80 mg once  daily.  Coronary artery disease involving native coronary artery of native heart with angina pectoris ([place]) Hx of CABG in 1998 and multiple PCIs since. Last cardiac catheterization in 2021 demonstrated all grafts occluded. She is stable w/o angina on her current regimen which includes Imdur 30 mg once daily, NTG prn chest pain, Ranexa 1000 mg twice daily, ASA 81 mg once daily, Lipitor 80 mg once daily. Continue current Rx.   (HFpEF) heart failure with preserved ejection fraction (HCC) EF was severely reduced at the time of her MI and PEA arrest after her LE bypass surgery in 2021. This improved to 45-50 in July 2021 and was normal on the echocardiogram done in Nov 2023. Volume status is stable on exam. She is NYHA III. Her dyspnea is likely multifactorial and related to CHF, COPD, deconditioning, vascular disease, coronary artery disease. She used to see pulmonology in [place] years ago. Continue Entresto 49/51 mg twice daily, spironolactone 25 mg once daily. BMET, BNP today. If BNP elevated, consider SGLT2i. She did request a disability placard for her car today. I completed the application for her.   PAF (paroxysmal atrial fibrillation) (HCC) Maintaining normal sinus rhythm on Amiodarone. SCr < 1.5, age < 58. Recent TSH, LFTs normal. Continue Eliquis 5 mg twice daily, Amiodarone 100 mg once daily. Obtain CXR given shortness of breath. Question if long term Amiodarone ok for her. If BNP and CXR normal, consider referral to pulmonology.   COPD (chronic obstructive pulmonary disease) ([place]) As noted, I suspect her shortness of breath is multifactorial. She used to see pulmonology in [place]. She uses albuterol as needed. If BNP and CXR normal, consider referral to pulmonology.         Dispo:  Return in about 6 months (around 08/17/2022) for Routine Follow Up, w/ Dr. [name].  Medication Adjustments/Labs and Tests Ordered: Current medicines are reviewed at length with the patient today.  Concerns  regarding medicines are outlined above.  Tests Ordered: Orders Placed This Encounter  Procedures   DG Chest 2 View   Basic metabolic panel   Pro b natriuretic peptide (BNP)   Medication Changes: No orders of the defined types were placed in this encounter.  Signed, Richardson Dopp, PA-C  02/15/2022 4:28 PM    [address], [place], [place]  [address] Phone: [phone]; Fax: [phone]

## 2022-02-15 NOTE — Assessment & Plan Note (Signed)
Maintaining normal sinus rhythm on Amiodarone. SCr < 1.5, age < 17. Recent TSH, LFTs normal. Continue Eliquis 5 mg twice daily, Amiodarone 100 mg once daily. Obtain CXR given shortness of breath. Question if long term Amiodarone ok for her. If BNP and CXR normal, consider referral to pulmonology.

## 2022-02-15 NOTE — Assessment & Plan Note (Signed)
Hx of CABG in 1998 and multiple PCIs since. Last cardiac catheterization in 2021 demonstrated all grafts occluded. She is stable w/o angina on her current regimen which includes Imdur 30 mg once daily, NTG prn chest pain, Ranexa 1000 mg twice daily, ASA 81 mg once daily, Lipitor 80 mg once daily. Continue current Rx.

## 2022-02-15 NOTE — Assessment & Plan Note (Addendum)
EF was severely reduced at the time of her MI and PEA arrest after her LE bypass surgery in 2021. This improved to 45-50 in July 2021 and was normal on the echocardiogram done in Nov 2023. Volume status is stable on exam. She is NYHA III. Her dyspnea is likely multifactorial and related to CHF, COPD, deconditioning, vascular disease, coronary artery disease. She used to see pulmonology in [place] years ago. Continue Entresto 49/51 mg twice daily, spironolactone 25 mg once daily. BMET, BNP today. If BNP elevated, consider SGLT2i. She did request a disability placard for her car today. I completed the application for her.

## 2022-02-15 NOTE — Assessment & Plan Note (Signed)
LDL optimal on most recent lab work.  Continue current Rx with Zetia 10 mg once daily, Lipitor 80 mg once daily.

## 2022-02-16 LAB — BASIC METABOLIC PANEL
BUN/Creatinine Ratio: 17 (ref 12–28)
BUN: 17 mg/dL (ref 8–27)
CO2: 22 mmol/L (ref 20–29)
Calcium: 8.5 mg/dL — ABNORMAL LOW (ref 8.7–10.3)
Chloride: 108 mmol/L — ABNORMAL HIGH (ref 96–106)
Creatinine, Ser: 0.98 mg/dL (ref 0.57–1.00)
Glucose: 100 mg/dL — ABNORMAL HIGH (ref 70–99)
Potassium: 5.2 mmol/L (ref 3.5–5.2)
Sodium: 143 mmol/L (ref 134–144)
eGFR: 62 mL/min/{1.73_m2} (ref >59)

## 2022-02-16 LAB — PRO B NATRIURETIC PEPTIDE: NT-Pro BNP: 523 pg/mL — ABNORMAL HIGH (ref 0–301)

## 2022-02-18 ENCOUNTER — Telehealth: Payer: Self-pay | Admitting: Physician Assistant

## 2022-02-18 DIAGNOSIS — Z79899 Other long term (current) drug therapy: Secondary | ICD-10-CM

## 2022-02-18 MED ORDER — EMPAGLIFLOZIN 10 MG PO TABS: 10.0000 mg | ORAL_TABLET | Freq: Every day | ORAL | 1 refills | Status: DC

## 2022-02-18 NOTE — Telephone Encounter (Signed)
Patient returning call for lab results. 

## 2022-02-18 NOTE — Telephone Encounter (Signed)
-----   Message from Jeanann Lewandowsky, [place] sent at 02/18/2022 11:03 AM EST -----  ----- Message ----- From: Liliane Shi, PA-C Sent: 02/16/2022   4:59 PM EST To: Cv Div [facility] Triage  Creatinine, K+ normal. Calcium low. BNP minimally elevated. PLAN:  -Continue current medications. -Add Jardiance 10 mg once daily (give samples and any Rx cards we can give) -BMET 2 weeks after starting -Remind patient of very rare possibility for urinary tract infections or genital area skin infections >> if occurs stop med and call us -F/u with PCP for low calcium  Richardson Dopp, PA-C    02/16/2022 4:55 PM

## 2022-02-18 NOTE — Telephone Encounter (Signed)
Spoke with pt.  She will start Jardiance 10 mg and have labs 03/04/22.

## 2022-02-21 ENCOUNTER — Ambulatory Visit
Admission: RE | Admit: 2022-02-21 | Discharge: 2022-02-21 | Disposition: A | Discharge status: Home / Self Care | Payer: Medicare Other | Source: Ambulatory Visit | Attending: Physician Assistant | Admitting: Physician Assistant

## 2022-02-21 DIAGNOSIS — I10 Essential (primary) hypertension: Secondary | ICD-10-CM

## 2022-02-22 ENCOUNTER — Telehealth: Payer: Self-pay | Admitting: Physician Assistant

## 2022-02-22 DIAGNOSIS — J449 Chronic obstructive pulmonary disease, unspecified: Secondary | ICD-10-CM

## 2022-02-22 NOTE — Telephone Encounter (Signed)
Patient has a urinary tract infection and thought she was getting medicine. Patient did not know that she had type 2 diabetes, and said she is not taking the empagliflozin (JARDIANCE) 10 MG TABS tablet. Please call patient back to discuss

## 2022-02-22 NOTE — Telephone Encounter (Signed)
The patient called back.  She has a UTI that has not been treated yet.  I adv her to hang up and call PCP office today.  They are open until 7pm.    She picked up Jardiance samples and took a couple days worth but has stopped since reading that it is for diabetes and could cause UTI. We discussed cardiovascular benefit in non diabetic patients but that she should not take anymore since she has an active UTI.  She is still SOB.  I reviewed her CXR results w her.  Advised I would forward this message to Richardson Dopp and we will call her with further recommendations.    She said the SOB is worse when she bends over and then even worse than that right after she stands up.  It feels like it used to feel back when she would see pulmonary in [place].  I cancelled the 2 week lab appointment.  Reminded her of the BP check on 03/15/22.

## 2022-02-22 NOTE — Telephone Encounter (Signed)
Left message for patient to call back  

## 2022-02-23 NOTE — Telephone Encounter (Signed)
DC Jardiance. I suspect at least some of her shortness of breath is due to Chronic Obstructive Pulmonary Disease. Refer to pulmonology for Chronic Obstructive Pulmonary Disease. Richardson Dopp, PA-C    02/23/2022 1:56 PM

## 2022-02-23 NOTE — Telephone Encounter (Signed)
Pt has been made aware to stop Jardiance and that we are going to refer her to [facility] for SOB.    Pt verbalized understanding.

## 2022-02-24 ENCOUNTER — Telehealth: Payer: Self-pay | Admitting: Physician Assistant

## 2022-02-24 NOTE — Telephone Encounter (Signed)
Returned pt's call and her questions have been answered.    Pt thanked me for the call back.

## 2022-02-24 NOTE — Telephone Encounter (Signed)
  Pt is requesting to speak with Rico Junker. Again regarding her lab result. She wants to know if she got her liver check. She said to call her at [phone]

## 2022-03-03 ENCOUNTER — Telehealth: Payer: Self-pay | Admitting: *Deleted

## 2022-03-03 ENCOUNTER — Encounter (HOSPITAL_BASED_OUTPATIENT_CLINIC_OR_DEPARTMENT_OTHER): Payer: Self-pay | Admitting: Orthopedic Surgery

## 2022-03-03 NOTE — Telephone Encounter (Signed)
Patient with diagnosis of afib on Eliquis for anticoagulation.    Procedure: right carpal tunnel release Date of procedure: 03/08/22  CHA2DS2-VASc Score = 5  This indicates a 7.2% annual risk of stroke. The patient's score is based upon: CHF History: 1 HTN History: 1 Diabetes History: 0 Stroke History: 0 Vascular Disease History: 1 Age Score: 1 Gender Score: 1   CrCl 9m/min Platelet count 171K  Per office protocol, patient can hold Eliquis for 1-2 days prior to procedure.    **This guidance is not considered finalized until pre-operative APP has relayed final recommendations.**

## 2022-03-03 NOTE — Telephone Encounter (Signed)
   Pre-operative Risk Assessment    Patient Name: Wendy [name]  DOB: 1950/08/12 MRN: [id number]      Request for Surgical Clearance    Procedure:   RT CARPAL TUNNEL RELEASE  Date of Surgery:  Clearance 03/08/22                                 Surgeon:  DR. Orene Desanctis Surgeon's Group or Practice Name:  [facility] Phone number:  [phone] Fax number:  [phone]    Type of Clearance Requested:   - Pharmacy:  Hold Apixaban (Eliquis) NOT INDICATED   Type of Anesthesia:  MAC   Additional requests/questions:    Astrid Divine   03/03/2022, 8:00 AM

## 2022-03-03 NOTE — Telephone Encounter (Signed)
   Patient Name: Wendy [name]  DOB: 1951-02-24 MRN: [id number]  Primary Cardiologist: [name], MD  Clinical pharmacists have reviewed the patient's past medical history, labs, and current medications as part of preoperative protocol coverage. The following recommendations have been made:  Per office protocol, patient can hold Eliquis for 1-2 days prior to procedure.  I will route this recommendation to the requesting party via Epic fax function and remove from pre-op pool.  Please call with questions.  Trudi Ida, NP 03/03/2022, 1:17 PM

## 2022-03-03 NOTE — Progress Notes (Signed)
Chart reviewed by Dr. Cheral Bay, due to significant heart history and pending referral for pulmonary patient will need to have surgery done at main. Dr. Greta Doom office called and notified patient will need to be moved.

## 2022-03-04 ENCOUNTER — Other Ambulatory Visit: Payer: Medicare Other

## 2022-03-08 ENCOUNTER — Encounter (HOSPITAL_BASED_OUTPATIENT_CLINIC_OR_DEPARTMENT_OTHER): Admission: RE | Payer: Self-pay | Source: Home / Self Care

## 2022-03-08 ENCOUNTER — Ambulatory Visit (HOSPITAL_BASED_OUTPATIENT_CLINIC_OR_DEPARTMENT_OTHER): Admission: RE | Admit: 2022-03-08 | Payer: Medicare Other | Source: Home / Self Care | Admitting: Orthopedic Surgery

## 2022-03-08 SURGERY — CARPAL TUNNEL RELEASE
Anesthesia: Monitor Anesthesia Care | Laterality: Bilateral

## 2022-03-15 ENCOUNTER — Ambulatory Visit: Payer: Medicare Other

## 2022-03-16 ENCOUNTER — Other Ambulatory Visit: Payer: Self-pay | Admitting: Cardiovascular Disease

## 2022-03-17 ENCOUNTER — Ambulatory Visit: Payer: Medicare Other

## 2022-04-12 ENCOUNTER — Ambulatory Visit (INDEPENDENT_AMBULATORY_CARE_PROVIDER_SITE_OTHER): Payer: 59 | Admitting: Medical

## 2022-04-12 VITALS — BP 96/60 | HR 64 | Ht 61.0 in | Wt 118.0 lb

## 2022-04-12 DIAGNOSIS — I10 Essential (primary) hypertension: Secondary | ICD-10-CM

## 2022-04-12 DIAGNOSIS — I5032 Chronic diastolic (congestive) heart failure: Secondary | ICD-10-CM | POA: Diagnosis not present

## 2022-04-12 DIAGNOSIS — F419 Anxiety disorder, unspecified: Secondary | ICD-10-CM | POA: Diagnosis not present

## 2022-04-12 DIAGNOSIS — I25119 Atherosclerotic heart disease of native coronary artery with unspecified angina pectoris: Secondary | ICD-10-CM

## 2022-04-12 DIAGNOSIS — F410 Panic disorder [episodic paroxysmal anxiety] without agoraphobia: Secondary | ICD-10-CM | POA: Diagnosis not present

## 2022-04-12 DIAGNOSIS — J449 Chronic obstructive pulmonary disease, unspecified: Secondary | ICD-10-CM

## 2022-04-12 DIAGNOSIS — I739 Peripheral vascular disease, unspecified: Secondary | ICD-10-CM

## 2022-04-12 DIAGNOSIS — Z79899 Other long term (current) drug therapy: Secondary | ICD-10-CM

## 2022-04-12 NOTE — Progress Notes (Signed)
Subjective:  Wendy [name] is a 72 y.o. female who presents for Chief Complaint  Patient presents with   Establish Care    Pt complains of heart palpitations, pt feels weak. Has an established cardiologist. Bp running low 97/59. Pt states out of medication.       Here as a new patient,  Prior PCP retired, Dr. Josetta Huddle  Medical team: Richardson Dopp, PA and Dr. [name], cardiology Dr. Orene Desanctis, orthopedics  She has significant health issues including heart failure, COPD, coronary artery disease, anxiety, asthma, history of anemia, GERD, history of myocardial infarction, peripheral vascular disease, and sleep apnea.  She notes that she was seeing Dr. Inda Merlin regularly until he retired recently.  She does see cardiology, on numerous medications.  She notes lately her blood pressures been running lower than average.  She has not yet scheduled to follow-up with cardiology  She attributes her blood pressures running low due to being out of her Xanax.  She notes that she historically has taken Xanax 3 times a day.  She ran out 2 months ago and has not done well since running out.  She had been lowered down to twice a day which she was doing regularly up until 2 months ago.  After Dr. Inda Merlin retired she saw a couple other providers and none of them were agreeable to continuing her Xanax  She says that her blood pressure is tied to her Xanax and that she does not feel good if she does not have her Xanax to help with anxiety and panic attacks.  She saw another doctor the other day for routine follow-up and had a panic attack in the doctor's office  She notes that she has not discussed her Xanax with cardiology in terms of helping control blood pressure  Last psychiatry visit unknown.  Last saw psychologist 20 years ago with separation from ex-husband.  She has been under more stress of late as her sister passed away a month ago  She states that anxiety runs in her family.  She says her  sister was on 1 mg Xanax 6 tablets daily  She states that she does not need to see psychiatry or counseling.  He does not need to see them as it is not going to help her.  She says what works is the Xanax.  In passing she notes that at times she would help her sister with some of her Xanax as her sister needed it more than she did  Of note she sees orthopedics and is on oxycodone pain medication as well  No other aggravating or relieving factors.    No other c/o.  Past Medical History:  Diagnosis Date   Anemia    Anginal pain ([place])    jaw pain 07/2016, s/p 07/15/16 nuclear stress test   Anxiety    Arthritis    "lower back" (08/24/2017)   Asthma    [place] Obstructive Pulmonary Disease    Chronic back pain    Chronic combined systolic and diastolic CHF    EF 1/93: 79-02 // EF 4/21: 55-60 // EF 4/21: 20-25   Chronic leg pain    Chronic lower back pain    Coronary Artery Disease    a. s/p NSTEMI >> CABG 1998 // s/p PCI 2013 // [place] 01/2015: DES to SVG-RCA.// Cath 05/2019: all grafts occluded; mod non-obs LAD disease w L-L and L-R collats - med Rx // post Ao-Bifem bypass MI c/b PEA, CGS requiring Impella >>  anatomy unchanged - Med Rx   Depression    Fall 08/2016   Gastroesophageal reflux disease    Glucose intolerance    Headache    "a few/month" (08/24/2017)   HLD (hyperlipidemia)    Hx of DVT X 3   RLE   Hypertension    Myocardial infarction ([place]) 1999   OSA on CPAP    Paroxysmal atrial fibrillation    a. 09/2013 post-op b. 01/2015 // Apixaban // recurrent AFib post MI after Ao-Bifem in 06/2019 >> Amio Rx   Peripheral artery disease    s/p L fem-fem bypass // s/p Ao-Bifem bypass in 06/2019   Peripheral neuropathy    Pneumonia    Shortness of breath    when walking   Wears glasses    Current Outpatient Medications on File Prior to Visit  Medication Sig Dispense Refill   acetaminophen (TYLENOL) 500 MG tablet Take 500-1,000 mg by mouth every 6 (six) hours as needed (for pain.).      albuterol (PROVENTIL HFA;VENTOLIN HFA) 108 (90 Base) MCG/ACT inhaler Inhale 2 puffs into the lungs every 6 (six) hours as needed for wheezing or shortness of breath. 1 each 1   albuterol (PROVENTIL) (2.5 MG/3ML) 0.083% nebulizer solution Inhale 2.5 mg into the lungs 3 (three) times daily as needed for shortness of breath or wheezing.     amiodarone (PACERONE) 100 MG tablet Take 1 tablet (100 mg total) by mouth in the morning. 90 tablet 3   apixaban (ELIQUIS) 5 MG TABS tablet Take 1 tablet (5 mg total) by mouth 2 (two) times daily. 180 tablet 3   aspirin 81 MG chewable tablet Chew 1 tablet (81 mg total) by mouth daily.     atorvastatin (LIPITOR) 80 MG tablet Take 1 tablet (80 mg total) by mouth daily. 90 tablet 3   buPROPion (WELLBUTRIN XL) 150 MG 24 hr tablet Take 150 mg by mouth daily.     ezetimibe (ZETIA) 10 MG tablet Take 10 mg by mouth daily.     Ferrous Sulfate Dried (SLOW RELEASE IRON) 45 MG TBCR Take by mouth.     furosemide (LASIX) 20 MG tablet Take 1 tablet (20 mg total) by mouth as needed for fluid or edema. 30 tablet 1   isosorbide mononitrate (IMDUR) 30 MG 24 hr tablet Take 1 tablet (30 mg total) by mouth daily. 90 tablet 3   levothyroxine (SYNTHROID) 50 MCG tablet Take 50 mcg by mouth daily before breakfast.     nitroGLYCERIN (NITROSTAT) 0.4 MG SL tablet Place 1 tablet (0.4 mg total) under the tongue every 5 (five) minutes as needed for chest pain. 25 tablet 2   pantoprazole (PROTONIX) 40 MG tablet Take 1 tablet (40 mg total) by mouth daily. 90 tablet 3   pramipexole (MIRAPEX) 0.25 MG tablet Take 0.25 mg by mouth at bedtime.      promethazine (PHENERGAN) 12.5 MG tablet TAKE ONE TABLET BY MOUTH EVERY 6 HOURS AS NEEDED FOR NAUSEA AND VOMITING 30 tablet 3   ranolazine (RANEXA) 1000 MG SR tablet Take 1 tablet (1,000 mg total) by mouth 2 (two) times daily. 180 tablet 3   sacubitril-valsartan (ENTRESTO) 49-51 MG Take 1 tablet by mouth 2 (two) times daily. 180 tablet 3   sertraline  (ZOLOFT) 100 MG tablet Take 150 mg by mouth at bedtime.      spironolactone (ALDACTONE) 25 MG tablet Take 1 tablet (25 mg total) by mouth daily. 90 tablet 3   Vitamin D, Ergocalciferol, (DRISDOL) 1.25 MG (  50000 UNIT) CAPS capsule Take 50,000 Units by mouth every 7 (seven) days.     ALPRAZolam (XANAX) 0.5 MG tablet Take 0.5 mg by mouth 3 (three) times daily as needed for anxiety. (Patient not taking: Reported on 04/12/2022)     oxyCODONE 10 MG TABS Take 1 tablet (10 mg total) by mouth 3 (three) times daily. (Patient not taking: Reported on 04/12/2022) 30 tablet 0   Current Facility-Administered Medications on File Prior to Visit  Medication Dose Route Frequency Provider Last Rate Last Admin   ferrous sulfate tablet 325 mg  325 mg Oral BID WC Irene Shipper, MD         The following portions of the patient's history were reviewed and updated as appropriate: allergies, current medications, past family history, past medical history, past social history, past surgical history and problem list.  ROS Otherwise as in subjective above  Objective: BP 96/60   Pulse 64   Ht '5\' 1"'$  (1.549 m)   Wt 118 lb (53.5 kg)   SpO2 97%   BMI 22.30 kg/m   Wt Readings from Last 3 Encounters:  04/12/22 118 lb (53.5 kg)  02/15/22 123 lb 12.8 oz (56.2 kg)  01/11/22 121 lb 9.6 oz (55.2 kg)   BP Readings from Last 3 Encounters:  04/12/22 96/60  02/15/22 118/60  01/11/22 (!) 170/70    General appearance: alert, no distress, well developed, well nourished Psych: Calm, no obvious distress, answering questions    Assessment: Encounter Diagnoses  Name Primary?   Anxiety Yes   Panic attack    Chronic heart failure with preserved ejection fraction (HCC)    Coronary artery disease involving native coronary artery of native heart with angina pectoris (HCC)    Essential hypertension    PAD (peripheral artery disease) (HCC)    Chronic obstructive pulmonary disease, unspecified COPD type (HCC)    High risk  medication use      Plan: I reviewed her health history, reviewed her long list of medications.  I tried to review some of her prior records but we do not have access in the EMR to her prior PCP notes today  I did review the PDMP aware controlled substance reporting system.  She was being dispensed #60 is Xanax rarely up until December.  Of note she was also being prescribed regular oxycodone from a couple different providers  She was mainly concerned about Xanax refill today.  She feels like her blood pressure is controlled by her panic and ability to take Xanax  We discussed that unfortunately even though she had been prescribed Xanax, it is not recommended to take that medicine on a regular basis particularly 2 or 3 times daily.  This is not appropriate and risky particularly with fall risk and habit-forming risks  I advised that if she would be willing to see psychiatry and/or counseling or agreeable to a trial of another medicine to help wean down off of such addictive medicine that we could work together.  However she has no desire to see psychiatry or counseling and advised me that if I cannot prescribe her Xanax she would find another provider.  This sounds like what happened with the last provider she saw as well  Of note, she has been out of Xanax 2 months.  She will not resume care here.  I did advise she go ahead and follow-up with her cardiologist  I discussed case with supervising physician Dr. Redmond School as well.  Hedi was seen today  for establish care.  Diagnoses and all orders for this visit:  Anxiety  Panic attack  Chronic heart failure with preserved ejection fraction (HCC)  Coronary artery disease involving native coronary artery of native heart with angina pectoris (HCC)  Essential hypertension  PAD (peripheral artery disease) (HCC)  Chronic obstructive pulmonary disease, unspecified COPD type ([place])  High risk medication use    Follow up: she declined to  come back

## 2022-04-13 ENCOUNTER — Telehealth: Payer: Self-pay | Admitting: *Deleted

## 2022-04-13 NOTE — Telephone Encounter (Signed)
-----   Message from Liliane Shi, [place] sent at 04/12/2022  5:27 PM EST ----- Can we ask her to check blood pressure once daily at home for 2 weeks and send those readings to [place] to review. Please remind her to rest for 20 min before checking her BP. Make sure her arm is at heart level and feet are flat on the floor. Avoid stimulants (e.g. caffeine, smoking) prior to checking blood pressure. Richardson Dopp, PA-C    04/12/2022 5:28 PM   ----- Message ----- From: Caryl Ada Sent: 04/12/2022   4:35 PM EST To: Liliane Shi, PA-C; Thayer Headings, MD  Hello to both of you.  I saw this patient for a new consult today.  She was seeing Dr. Inda Merlin for PCP who apparently recently retired.  She had some recent low blood pressure readings and she attributes her blood pressure control to her use of Xanax and control of anxiety.  She was mainly interested in Xanax refills today.  Unfortunately we did not see eye to eye on how to treat anxiety or proper use of this type of medication.  She was not agreeable to seeing psychiatry, counseling, or trying to modify her regimen.    She decided to seek care elsewhere for PCP, but I wanted to give you an FYI that she probably needs to go ahead and follow-up with you on the low blood pressure readings in general  Thanks Audelia Acton

## 2022-04-13 NOTE — Telephone Encounter (Signed)
Call placed to pt regarding message below.  Voicemail box is full.

## 2022-04-22 NOTE — Telephone Encounter (Signed)
2nd attempt to reach pt.  Voicemail is full and couldn't leave a message.

## 2022-05-11 ENCOUNTER — Encounter: Payer: Self-pay | Admitting: *Deleted

## 2022-05-11 NOTE — Telephone Encounter (Signed)
3rd attempt to reach pt regarding message below.  Left a message for pt to call back.  Will also send a mychart message.

## 2022-07-07 ENCOUNTER — Telehealth: Payer: Self-pay | Admitting: Medical

## 2022-07-07 NOTE — Telephone Encounter (Signed)
Called patient to schedule Medicare Annual Wellness Visit (AWV). Left message for patient to call back and schedule Medicare Annual Wellness Visit (AWV).  Last date of AWV: due 04/27/18 per snapshot  Please schedule an appointment at any time with [facility].  If any questions, please contact me at [phone].  Thank you ,  Rudell Cobb AWV direct phone # [phone]

## 2022-07-24 ENCOUNTER — Emergency Department (HOSPITAL_COMMUNITY): Payer: Medicare HMO

## 2022-07-24 ENCOUNTER — Inpatient Hospital Stay (HOSPITAL_COMMUNITY)
Admission: EM | Admit: 2022-07-24 | Discharge: 2022-07-26 | DRG: 309 | Disposition: A | Discharge status: Home / Self Care | Payer: Medicare HMO | Attending: Internal Medicine | Admitting: Internal Medicine

## 2022-07-24 ENCOUNTER — Other Ambulatory Visit: Payer: Self-pay

## 2022-07-24 ENCOUNTER — Encounter (HOSPITAL_COMMUNITY): Payer: Self-pay | Admitting: Emergency Medicine

## 2022-07-24 DIAGNOSIS — R259 Unspecified abnormal involuntary movements: Secondary | ICD-10-CM | POA: Diagnosis present

## 2022-07-24 DIAGNOSIS — R053 Chronic cough: Secondary | ICD-10-CM | POA: Diagnosis present

## 2022-07-24 DIAGNOSIS — I480 Paroxysmal atrial fibrillation: Secondary | ICD-10-CM | POA: Diagnosis present

## 2022-07-24 DIAGNOSIS — Z86718 Personal history of other venous thrombosis and embolism: Secondary | ICD-10-CM

## 2022-07-24 DIAGNOSIS — R4689 Other symptoms and signs involving appearance and behavior: Secondary | ICD-10-CM

## 2022-07-24 DIAGNOSIS — R4182 Altered mental status, unspecified: Secondary | ICD-10-CM

## 2022-07-24 DIAGNOSIS — F410 Panic disorder [episodic paroxysmal anxiety] without agoraphobia: Secondary | ICD-10-CM | POA: Diagnosis present

## 2022-07-24 DIAGNOSIS — I10 Essential (primary) hypertension: Secondary | ICD-10-CM | POA: Diagnosis not present

## 2022-07-24 DIAGNOSIS — F1210 Cannabis abuse, uncomplicated: Secondary | ICD-10-CM | POA: Diagnosis present

## 2022-07-24 DIAGNOSIS — M545 Low back pain, unspecified: Secondary | ICD-10-CM | POA: Diagnosis present

## 2022-07-24 DIAGNOSIS — I110 Hypertensive heart disease with heart failure: Secondary | ICD-10-CM | POA: Diagnosis present

## 2022-07-24 DIAGNOSIS — E860 Dehydration: Secondary | ICD-10-CM | POA: Diagnosis present

## 2022-07-24 DIAGNOSIS — I739 Peripheral vascular disease, unspecified: Secondary | ICD-10-CM | POA: Diagnosis present

## 2022-07-24 DIAGNOSIS — I25119 Atherosclerotic heart disease of native coronary artery with unspecified angina pectoris: Secondary | ICD-10-CM | POA: Diagnosis present

## 2022-07-24 DIAGNOSIS — Z955 Presence of coronary angioplasty implant and graft: Secondary | ICD-10-CM

## 2022-07-24 DIAGNOSIS — Z8674 Personal history of sudden cardiac arrest: Secondary | ICD-10-CM

## 2022-07-24 DIAGNOSIS — Z79899 Other long term (current) drug therapy: Secondary | ICD-10-CM

## 2022-07-24 DIAGNOSIS — Z7901 Long term (current) use of anticoagulants: Secondary | ICD-10-CM

## 2022-07-24 DIAGNOSIS — Z96641 Presence of right artificial hip joint: Secondary | ICD-10-CM | POA: Diagnosis present

## 2022-07-24 DIAGNOSIS — G4730 Sleep apnea, unspecified: Secondary | ICD-10-CM | POA: Diagnosis present

## 2022-07-24 DIAGNOSIS — Z885 Allergy status to narcotic agent status: Secondary | ICD-10-CM

## 2022-07-24 DIAGNOSIS — F322 Major depressive disorder, single episode, severe without psychotic features: Secondary | ICD-10-CM | POA: Diagnosis present

## 2022-07-24 DIAGNOSIS — G2581 Restless legs syndrome: Secondary | ICD-10-CM | POA: Diagnosis present

## 2022-07-24 DIAGNOSIS — E785 Hyperlipidemia, unspecified: Secondary | ICD-10-CM | POA: Diagnosis present

## 2022-07-24 DIAGNOSIS — E875 Hyperkalemia: Secondary | ICD-10-CM | POA: Diagnosis present

## 2022-07-24 DIAGNOSIS — Z832 Family history of diseases of the blood and blood-forming organs and certain disorders involving the immune mechanism: Secondary | ICD-10-CM

## 2022-07-24 DIAGNOSIS — Z801 Family history of malignant neoplasm of trachea, bronchus and lung: Secondary | ICD-10-CM

## 2022-07-24 DIAGNOSIS — Z7982 Long term (current) use of aspirin: Secondary | ICD-10-CM

## 2022-07-24 DIAGNOSIS — Z79890 Hormone replacement therapy (postmenopausal): Secondary | ICD-10-CM

## 2022-07-24 DIAGNOSIS — E2749 Other adrenocortical insufficiency: Secondary | ICD-10-CM | POA: Diagnosis present

## 2022-07-24 DIAGNOSIS — Z8701 Personal history of pneumonia (recurrent): Secondary | ICD-10-CM

## 2022-07-24 DIAGNOSIS — R569 Unspecified convulsions: Secondary | ICD-10-CM | POA: Diagnosis not present

## 2022-07-24 DIAGNOSIS — I959 Hypotension, unspecified: Secondary | ICD-10-CM | POA: Diagnosis present

## 2022-07-24 DIAGNOSIS — Z880 Allergy status to penicillin: Secondary | ICD-10-CM

## 2022-07-24 DIAGNOSIS — I441 Atrioventricular block, second degree: Secondary | ICD-10-CM | POA: Diagnosis not present

## 2022-07-24 DIAGNOSIS — Z8249 Family history of ischemic heart disease and other diseases of the circulatory system: Secondary | ICD-10-CM

## 2022-07-24 DIAGNOSIS — R001 Bradycardia, unspecified: Secondary | ICD-10-CM | POA: Diagnosis present

## 2022-07-24 DIAGNOSIS — G4733 Obstructive sleep apnea (adult) (pediatric): Secondary | ICD-10-CM | POA: Diagnosis present

## 2022-07-24 DIAGNOSIS — K219 Gastro-esophageal reflux disease without esophagitis: Secondary | ICD-10-CM | POA: Diagnosis present

## 2022-07-24 DIAGNOSIS — Z91048 Other nonmedicinal substance allergy status: Secondary | ICD-10-CM

## 2022-07-24 DIAGNOSIS — I5022 Chronic systolic (congestive) heart failure: Secondary | ICD-10-CM | POA: Diagnosis not present

## 2022-07-24 DIAGNOSIS — Z1152 Encounter for screening for COVID-19: Secondary | ICD-10-CM

## 2022-07-24 DIAGNOSIS — I5042 Chronic combined systolic (congestive) and diastolic (congestive) heart failure: Secondary | ICD-10-CM | POA: Diagnosis present

## 2022-07-24 DIAGNOSIS — W228XXA Striking against or struck by other objects, initial encounter: Secondary | ICD-10-CM | POA: Diagnosis present

## 2022-07-24 DIAGNOSIS — J4489 Other specified chronic obstructive pulmonary disease: Secondary | ICD-10-CM | POA: Diagnosis present

## 2022-07-24 DIAGNOSIS — E861 Hypovolemia: Secondary | ICD-10-CM | POA: Diagnosis not present

## 2022-07-24 DIAGNOSIS — Z888 Allergy status to other drugs, medicaments and biological substances status: Secondary | ICD-10-CM

## 2022-07-24 DIAGNOSIS — I5030 Unspecified diastolic (congestive) heart failure: Secondary | ICD-10-CM | POA: Diagnosis present

## 2022-07-24 DIAGNOSIS — J449 Chronic obstructive pulmonary disease, unspecified: Secondary | ICD-10-CM | POA: Diagnosis present

## 2022-07-24 DIAGNOSIS — I9589 Other hypotension: Secondary | ICD-10-CM | POA: Diagnosis not present

## 2022-07-24 DIAGNOSIS — Z87891 Personal history of nicotine dependence: Secondary | ICD-10-CM

## 2022-07-24 DIAGNOSIS — F419 Anxiety disorder, unspecified: Secondary | ICD-10-CM | POA: Diagnosis present

## 2022-07-24 DIAGNOSIS — G629 Polyneuropathy, unspecified: Secondary | ICD-10-CM

## 2022-07-24 DIAGNOSIS — I5032 Chronic diastolic (congestive) heart failure: Secondary | ICD-10-CM | POA: Diagnosis not present

## 2022-07-24 DIAGNOSIS — Z951 Presence of aortocoronary bypass graft: Secondary | ICD-10-CM

## 2022-07-24 DIAGNOSIS — I252 Old myocardial infarction: Secondary | ICD-10-CM

## 2022-07-24 DIAGNOSIS — G8929 Other chronic pain: Secondary | ICD-10-CM | POA: Diagnosis present

## 2022-07-24 LAB — COMPREHENSIVE METABOLIC PANEL
ALT: 20 U/L (ref 0–44)
AST: 22 U/L (ref 15–41)
Albumin: 2.8 g/dL — ABNORMAL LOW (ref 3.5–5.0)
Alkaline Phosphatase: 50 U/L (ref 38–126)
Anion gap: 6 (ref 5–15)
BUN: 40 mg/dL — ABNORMAL HIGH (ref 8–23)
CO2: 20 mmol/L — ABNORMAL LOW (ref 22–32)
Calcium: 8.5 mg/dL — ABNORMAL LOW (ref 8.9–10.3)
Chloride: 111 mmol/L (ref 98–111)
Creatinine, Ser: 1.15 mg/dL — ABNORMAL HIGH (ref 0.44–1.00)
GFR, Estimated: 51 mL/min — ABNORMAL LOW (ref >60)
Glucose, Bld: 98 mg/dL (ref 70–99)
Potassium: 6 mmol/L — ABNORMAL HIGH (ref 3.5–5.1)
Sodium: 137 mmol/L (ref 135–145)
Total Bilirubin: 0.5 mg/dL (ref 0.3–1.2)
Total Protein: 5.9 g/dL — ABNORMAL LOW (ref 6.5–8.1)

## 2022-07-24 LAB — TROPONIN I (HIGH SENSITIVITY): Troponin I (High Sensitivity): 12 ng/L (ref <18)

## 2022-07-24 LAB — CBC WITH DIFFERENTIAL/PLATELET
Abs Immature Granulocytes: 0.04 10*3/uL (ref 0.00–0.07)
Basophils Absolute: 0 10*3/uL (ref 0.0–0.1)
Basophils Relative: 1 %
Eosinophils Absolute: 0.1 10*3/uL (ref 0.0–0.5)
Eosinophils Relative: 1 %
HCT: 37.9 % (ref 36.0–46.0)
Hemoglobin: 11.8 g/dL — ABNORMAL LOW (ref 12.0–15.0)
Immature Granulocytes: 1 %
Lymphocytes Relative: 14 %
Lymphs Abs: 1.1 10*3/uL (ref 0.7–4.0)
MCH: 30.3 pg (ref 26.0–34.0)
MCHC: 31.1 g/dL (ref 30.0–36.0)
MCV: 97.2 fL (ref 80.0–100.0)
Monocytes Absolute: 0.4 10*3/uL (ref 0.1–1.0)
Monocytes Relative: 5 %
Neutro Abs: 6 10*3/uL (ref 1.7–7.7)
Neutrophils Relative %: 78 %
Platelets: 161 10*3/uL (ref 150–400)
RBC: 3.9 MIL/uL (ref 3.87–5.11)
RDW: 13.7 % (ref 11.5–15.5)
WBC: 7.6 10*3/uL (ref 4.0–10.5)
nRBC: 0 % (ref 0.0–0.2)

## 2022-07-24 LAB — RAPID URINE DRUG SCREEN, HOSP PERFORMED
Amphetamines: NOT DETECTED
Barbiturates: NOT DETECTED
Benzodiazepines: POSITIVE — AB
Cocaine: NOT DETECTED
Opiates: POSITIVE — AB
Tetrahydrocannabinol: POSITIVE — AB

## 2022-07-24 LAB — ETHANOL: Alcohol, Ethyl (B): <10 mg/dL (ref <10)

## 2022-07-24 LAB — URINALYSIS, ROUTINE W REFLEX MICROSCOPIC
Bilirubin Urine: NEGATIVE
Glucose, UA: 150 mg/dL — AB
Hgb urine dipstick: NEGATIVE
Ketones, ur: NEGATIVE mg/dL
Leukocytes,Ua: NEGATIVE
Nitrite: NEGATIVE
Protein, ur: NEGATIVE mg/dL
Specific Gravity, Urine: 1.014 (ref 1.005–1.030)
pH: 5 (ref 5.0–8.0)

## 2022-07-24 LAB — BASIC METABOLIC PANEL
Anion gap: 5 (ref 5–15)
BUN: 30 mg/dL — ABNORMAL HIGH (ref 8–23)
CO2: 21 mmol/L — ABNORMAL LOW (ref 22–32)
Calcium: 7.9 mg/dL — ABNORMAL LOW (ref 8.9–10.3)
Chloride: 112 mmol/L — ABNORMAL HIGH (ref 98–111)
Creatinine, Ser: 1.01 mg/dL — ABNORMAL HIGH (ref 0.44–1.00)
GFR, Estimated: 60 mL/min — ABNORMAL LOW (ref >60)
Glucose, Bld: 111 mg/dL — ABNORMAL HIGH (ref 70–99)
Potassium: 5.2 mmol/L — ABNORMAL HIGH (ref 3.5–5.1)
Sodium: 138 mmol/L (ref 135–145)

## 2022-07-24 LAB — TSH: TSH: 0.802 u[IU]/mL (ref 0.350–4.500)

## 2022-07-24 LAB — LACTIC ACID, PLASMA: Lactic Acid, Venous: 1.9 mmol/L (ref 0.5–1.9)

## 2022-07-24 LAB — MAGNESIUM: Magnesium: 2 mg/dL (ref 1.7–2.4)

## 2022-07-24 LAB — CORTISOL-AM, BLOOD: Cortisol - AM: 3.4 ug/dL — ABNORMAL LOW (ref 6.7–22.6)

## 2022-07-24 LAB — HIV ANTIBODY (ROUTINE TESTING W REFLEX): HIV Screen 4th Generation wRfx: NONREACTIVE

## 2022-07-24 LAB — SARS CORONAVIRUS 2 BY RT PCR: SARS Coronavirus 2 by RT PCR: NEGATIVE

## 2022-07-24 LAB — CK: Total CK: 44 U/L (ref 38–234)

## 2022-07-24 LAB — AMMONIA: Ammonia: 14 umol/L (ref 9–35)

## 2022-07-24 MED ORDER — OXYCODONE HCL 5 MG PO TABS
10.0000 mg | ORAL_TABLET | Freq: Three times a day (TID) | ORAL | Status: DC | PRN
  Administered 2022-07-24 – 2022-07-26 (×5): 10 mg via ORAL
  Filled 2022-07-24 (×5): qty 2

## 2022-07-24 MED ORDER — RANOLAZINE ER 500 MG PO TB12
1000.0000 mg | ORAL_TABLET | Freq: Two times a day (BID) | ORAL | Status: DC
  Administered 2022-07-24 – 2022-07-26 (×5): 1000 mg via ORAL
  Filled 2022-07-24 (×5): qty 2

## 2022-07-24 MED ORDER — HYDROCORTISONE 5 MG PO TABS
5.0000 mg | ORAL_TABLET | Freq: Two times a day (BID) | ORAL | Status: DC
  Administered 2022-07-24 – 2022-07-26 (×5): 5 mg via ORAL
  Filled 2022-07-24 (×6): qty 1

## 2022-07-24 MED ORDER — APIXABAN 5 MG PO TABS
5.0000 mg | ORAL_TABLET | Freq: Two times a day (BID) | ORAL | Status: DC
  Administered 2022-07-24 – 2022-07-26 (×5): 5 mg via ORAL
  Filled 2022-07-24 (×5): qty 1

## 2022-07-24 MED ORDER — ALBUMIN HUMAN 25 % IV SOLN
25.0000 g | Freq: Once | INTRAVENOUS | Status: AC
  Administered 2022-07-24: 25 g via INTRAVENOUS
  Filled 2022-07-24: qty 100

## 2022-07-24 MED ORDER — ACETAMINOPHEN 325 MG PO TABS: 650.0000 mg | ORAL_TABLET | Freq: Four times a day (QID) | ORAL | Status: DC | PRN

## 2022-07-24 MED ORDER — BUPROPION HCL ER (XL) 150 MG PO TB24
150.0000 mg | ORAL_TABLET | Freq: Every day | ORAL | Status: DC
  Administered 2022-07-24 – 2022-07-25 (×2): 150 mg via ORAL
  Filled 2022-07-24 (×2): qty 1

## 2022-07-24 MED ORDER — PANTOPRAZOLE SODIUM 40 MG PO TBEC
40.0000 mg | DELAYED_RELEASE_TABLET | Freq: Every day | ORAL | Status: DC
  Administered 2022-07-25 – 2022-07-26 (×2): 40 mg via ORAL
  Filled 2022-07-24 (×2): qty 1

## 2022-07-24 MED ORDER — APIXABAN 5 MG PO TABS: 5.0000 mg | ORAL_TABLET | Freq: Two times a day (BID) | ORAL | Status: DC

## 2022-07-24 MED ORDER — ALBUTEROL SULFATE (2.5 MG/3ML) 0.083% IN NEBU: 2.5000 mg | INHALATION_SOLUTION | Freq: Three times a day (TID) | RESPIRATORY_TRACT | Status: DC | PRN

## 2022-07-24 MED ORDER — PRAMIPEXOLE DIHYDROCHLORIDE 0.25 MG PO TABS
0.2500 mg | ORAL_TABLET | Freq: Every day | ORAL | Status: DC
  Administered 2022-07-24 – 2022-07-25 (×2): 0.25 mg via ORAL
  Filled 2022-07-24 (×3): qty 1

## 2022-07-24 MED ORDER — ALPRAZOLAM 0.5 MG PO TABS
0.5000 mg | ORAL_TABLET | Freq: Two times a day (BID) | ORAL | Status: DC | PRN
  Administered 2022-07-24 – 2022-07-26 (×4): 0.5 mg via ORAL
  Filled 2022-07-24 (×4): qty 1

## 2022-07-24 MED ORDER — LACTATED RINGERS IV BOLUS
1000.0000 mL | Freq: Once | INTRAVENOUS | Status: AC
  Administered 2022-07-24: 1000 mL via INTRAVENOUS

## 2022-07-24 MED ORDER — ATORVASTATIN CALCIUM 80 MG PO TABS
80.0000 mg | ORAL_TABLET | Freq: Every day | ORAL | Status: DC
  Administered 2022-07-24 – 2022-07-26 (×3): 80 mg via ORAL
  Filled 2022-07-24: qty 2
  Filled 2022-07-24 (×2): qty 1

## 2022-07-24 MED ORDER — EZETIMIBE 10 MG PO TABS
10.0000 mg | ORAL_TABLET | Freq: Every day | ORAL | Status: DC
  Administered 2022-07-24 – 2022-07-26 (×3): 10 mg via ORAL
  Filled 2022-07-24 (×3): qty 1

## 2022-07-24 MED ORDER — ISOSORBIDE MONONITRATE ER 30 MG PO TB24
30.0000 mg | ORAL_TABLET | Freq: Every day | ORAL | Status: DC
  Administered 2022-07-24 – 2022-07-26 (×3): 30 mg via ORAL
  Filled 2022-07-24 (×3): qty 1

## 2022-07-24 MED ORDER — SERTRALINE HCL 50 MG PO TABS
150.0000 mg | ORAL_TABLET | Freq: Every day | ORAL | Status: DC
  Administered 2022-07-24 – 2022-07-25 (×2): 150 mg via ORAL
  Filled 2022-07-24 (×2): qty 1

## 2022-07-24 MED ORDER — LEVOTHYROXINE SODIUM 50 MCG PO TABS
50.0000 ug | ORAL_TABLET | Freq: Every day | ORAL | Status: DC
  Administered 2022-07-25 – 2022-07-26 (×2): 50 ug via ORAL
  Filled 2022-07-24 (×2): qty 1

## 2022-07-24 MED ORDER — ACETAMINOPHEN 650 MG RE SUPP: 650.0000 mg | Freq: Four times a day (QID) | RECTAL | Status: DC | PRN

## 2022-07-24 NOTE — H&P (Addendum)
History and Physical    Patient: Wendy [name] KYH:[id number] DOB: Feb 12, 1951 DOA: 07/24/2022 DOS: the patient was seen and examined on 07/24/2022 PCP: Jac Canavan, PA-C  Patient coming from: Home - lives with her nephew.    Chief Complaint: jerky spells   HPI: Wendy [name] is a 72 y.o. female with medical history significant of COPD, OSA, CAD, PAD, PAF on Eliquis, HFpEF, anxiety and depression, and substance abuse who presented to ED with complaints of a "shaky spell." She states this episode woke her up in the middle of the night. She has had these intermittent spells x 4 years.  She woke up jerking. She states she has had this happen in the past, but this was the worst it has been. She had her nephew call 911. Per note she did not want to come to hospital and threw herself backward and her head hit the wall. She tells me that she wanted to lay down and hit her head on the wall. She tells me she has these jerking spells not often. She feels like her legs turn to jelly and she can't walk. They can happen anytime of the day. Can last for 3 hours and she is shaking the whole time. She has no chest pain/palpitations, N/V, dizziness or other associated symptoms. She does have panic attacks, but states these are different. She never loses consciousness. She denies any new medication.  She has had a cold with productive cough. Saw her PCP on Friday and was not given any medication. She was given an antibiotic 3 weeks ago.   She does admit to MJ edibles, but states this is not related to her jerky episodes.   Denies any fever/chills, headaches, chest pain or palpitations, shortness of breath, abdominal pain, N/V/D, dysuria or leg swelling.  She has a poor appetite and doesn't drink much water.   She has low blood pressure readings at  home to 70/30.   She does not smoke or drink alcohol.   ER Course:  vitals: afebrile, bp: 85/34, HR: 43 RR: 12, oxygen: 100%RA Pertinent labs: potassium:  6.0, BUN: 40, creatinine: 1.15, UDS: THC, opiates, BZD,  CXR: no acute finding CT head: no acute finding CT cervical spine: no acute finding. Right thyroid nodule 13mm.  In ED: given 2L IVF and albumin. PCCM consulted. Okay with TRH admit to progressive    Review of Systems: As mentioned in the history of present illness. All other systems reviewed and are negative. Past Medical History:  Diagnosis Date   Anemia    Anginal pain (HCC)    jaw pain 07/2016, s/p 07/15/16 nuclear stress test   Anxiety    Arthritis    "lower back" (08/24/2017)   Asthma    Chornic Obstructive Pulmonary Disease    Chronic back pain    Chronic combined systolic and diastolic CHF    EF 5/18: 60-65 // EF 4/21: 55-60 // EF 4/21: 20-25   Chronic leg pain    Chronic lower back pain    Coronary Artery Disease    a. s/p NSTEMI >> CABG 1998 // s/p PCI 2013 // LHC 01/2015: DES to SVG-RCA.// Cath 05/2019: all grafts occluded; mod non-obs LAD disease w L-L and L-R collats - med Rx // post Ao-Bifem bypass MI c/b PEA, CGS requiring Impella >> anatomy unchanged - Med Rx   Depression    Fall 08/2016   Gastroesophageal reflux disease    Glucose intolerance    Headache    "  a few/month" (08/24/2017)   HLD (hyperlipidemia)    Hx of DVT X 3   RLE   Hypertension    Myocardial infarction (HCC) 1999   OSA on CPAP    Paroxysmal atrial fibrillation    a. 09/2013 post-op b. 01/2015 // Apixaban // recurrent AFib post MI after Ao-Bifem in 06/2019 >> Amio Rx   Peripheral artery disease    s/p L fem-fem bypass // s/p Ao-Bifem bypass in 06/2019   Peripheral neuropathy    Pneumonia    Shortness of breath    when walking   Wears glasses    Past Surgical History:  Procedure Laterality Date   ABDOMINAL AORTOGRAM W/LOWER EXTREMITY Bilateral 04/29/2019   Procedure: ABDOMINAL AORTOGRAM W/LOWER EXTREMITY;  Surgeon: Maeola Harman, MD;  Location: [facility] INVASIVE CV LAB;  Service: Cardiovascular;  Laterality: Bilateral;   ANTERIOR  CERVICAL DECOMP/DISCECTOMY FUSION  1991   AORTA - BILATERAL FEMORAL ARTERY BYPASS GRAFT N/A 06/18/2019   Procedure: AORTA BIFEMORAL BYPASS GRAFT;  Surgeon: Maeola Harman, MD;  Location[facility] OR;  Service: Vascular;  Laterality: N/A;   ARTERY REPAIR Left 06/30/2019   Procedure: LEFT BRACHIAL ARTERY REPAIR, EVACUATION OF HEMATOMA;  Surgeon: Nada Libman, MD;  Location: [facility] OR;  Service: Vascular;  Laterality: Left;   BACK SURGERY     CARDIAC CATHETERIZATION N/A 01/22/2015   Procedure: Left Heart Cath and Coronary Angiography;  Surgeon: Lennette Bihari, MD;  Location: [facility] INVASIVE CV LAB;  Service: Cardiovascular;  Laterality: N/A;   CARDIAC CATHETERIZATION N/A 01/22/2015   Procedure: Coronary Stent Intervention;  Surgeon: Lennette Bihari, MD;  Location: [facility] INVASIVE CV LAB;  Service: Cardiovascular;  Laterality: N/A;  3.0x12 xience prox SVG to RCA   CARDIAC CATHETERIZATION  03/09/2000   hx/notes 03/20/2000   CHOLECYSTECTOMY N/A 09/16/2013   Procedure: LAPAROSCOPIC CHOLECYSTECTOMY CONVERTED TO OPEN CHOLECYSTECTOMY WITH CHOLANGIOGRAM;  Surgeon: Shelly Rubenstein, MD;  Location: [facility] OR;  Service: General;  Laterality: N/A;   CORONARY ANGIOGRAPHY N/A 06/27/2019   Procedure: CORONARY ANGIOGRAPHY (CATH LAB);  Surgeon: Tonny Bollman, MD;  Location: [facility] INVASIVE CV LAB;  Service: Cardiovascular;  Laterality: N/A;   CORONARY ANGIOPLASTY WITH STENT PLACEMENT  11/2011, 02/2012, 01/2015   DES to SVG-RCA both times, In [place], [place], Dr. Unice Cobble   CORONARY ARTERY BYPASS GRAFT  1998   LIMA-LAD, SVG-OM, SVG-RCA   ERCP N/A 09/19/2013   Procedure: ENDOSCOPIC RETROGRADE CHOLANGIOPANCREATOGRAPHY (ERCP);  Surgeon: Louis Meckel, MD;  Location: [facility] ENDOSCOPY;  Service: Endoscopy;  Laterality: N/A;   FINGER SURGERY Right    ring finger "fused"   FRACTURE SURGERY     IRRIGATION AND DEBRIDEMENT ABSCESS Right 10/17/2013   Procedure: INCISION AND DRAINAGE RIGHT SUBCOSTAL WOUND;  Surgeon: Kandis Cocking, MD;   Location: [facility] ORS;  Service: General;  Laterality: Right;   JOINT REPLACEMENT     KNEE ARTHROSCOPY Right 1998   LEFT HEART CATH AND CORS/GRAFTS ANGIOGRAPHY N/A 07/25/2016   Procedure: Left Heart Cath and Cors/Grafts Angiography;  Surgeon: Corky Crafts, MD;  Location: [facility] INVASIVE CV LAB;  Service: Cardiovascular;  Laterality: N/A;   LEFT HEART CATH AND CORS/GRAFTS ANGIOGRAPHY N/A 06/05/2019   Procedure: LEFT HEART CATH AND CORS/GRAFTS ANGIOGRAPHY;  Surgeon: Kathleene Hazel, MD;  Location: [facility] INVASIVE CV LAB;  Service: Cardiovascular;  Laterality: N/A;   LEFT HEART CATHETERIZATION WITH CORONARY/GRAFT ANGIOGRAM N/A 07/19/2013   Procedure: LEFT HEART CATHETERIZATION WITH Isabel Caprice;  Surgeon: Marykay Lex, MD;  Location: [facility] CATH LAB;  Service: Cardiovascular;  Laterality: N/A;   MASS EXCISION Right 06/25/2014   Procedure: EXCISION BUTTOCK MASS ;  Surgeon: Abigail Miyamoto, MD;  Location: [facility];  Service: General;  Laterality: Right;   ORIF HUMERUS FRACTURE Left 08/27/2013   Procedure: OPEN REDUCTION INTERNAL FIXATION (ORIF) LEFT HUMERUS ;  Surgeon: Budd Palmer, MD;  Location: [facility] OR;  Service: Orthopedics;  Laterality: Left;   PLACEMENT OF IMPELLA LEFT VENTRICULAR ASSIST DEVICE N/A 06/24/2019   Procedure: PLACEMENT OF IMPELLA LEFT VENTRICULAR ASSIST DEVICE;  Surgeon: Linden Dolin, MD;  Location: [facility] OR;  Service: Open Heart Surgery;  Laterality: N/A;   POSTERIOR FUSION CERVICAL SPINE  1992   REMOVAL OF IMPELLA LEFT VENTRICULAR ASSIST DEVICE Right 06/28/2019   Procedure: REMOVAL OF IMPELLA LEFT VENTRICULAR ASSIST DEVICE;  Surgeon: Linden Dolin, MD;  Location: [facility] OR;  Service: Open Heart Surgery;  Laterality: Right;   THROMBECTOMY / EMBOLECTOMY FEMORAL ARTERY Right    hx/notes 03/20/2000   TOTAL HIP ARTHROPLASTY Right 07/19/2016   Procedure: TOTAL HIP ARTHROPLASTY ANTERIOR APPROACH;  Surgeon: Sheral Apley, MD;  Location: [facility] OR;  Service:  Orthopedics;  Laterality: Right;   TOTAL HIP REVISION Right 08/22/2017   Procedure: TOTAL HIP REVISION;  Surgeon: Sheral Apley, MD;  Location: [facility] OR;  Service: Orthopedics;  Laterality: Right;   Social History:  reports that she quit smoking about 24 years ago. Her smoking use included cigarettes. She has a 17.00 pack-year smoking history. She has never used smokeless tobacco. She reports that she does not currently use alcohol. She reports current drug use. Drug: Marijuana.  Allergies  Allergen Reactions   Benadryl [Diphenhydramine] Shortness Of Breath   Penicillins Diarrhea    Did it involve swelling of the face/tongue/throat, SOB, or low BP? No Did it involve sudden or severe rash/hives, skin peeling, or any reaction on the inside of your mouth or nose? No Did you need to seek medical attention at a hospital or doctor's office? No When did it last happen?      2 months If all above answers are "NO", may proceed with cephalosporin use.   Adhesive [Tape] Other (See Comments)    Burning of skin   Amoxicillin Diarrhea and Other (See Comments)    Has patient had a PCN reaction causing immediate rash, facial/tongue/throat swelling, SOB or lightheadedness with hypotension: No Has patient had a PCN reaction causing severe rash involving mucus membranes or skin necrosis: No Has patient had a PCN reaction that required hospitalization No Has patient had a PCN reaction occurring within the last 10 years: Yes -- noted rxn as diarrhea If all of the above answers are "NO", then may proceed with Cephalosporin use.    Hydrocodone-Acetaminophen Itching and Nausea And Vomiting   Cyclobenzaprine Other (See Comments)    Causes restless leg syndrome Restless syndrome   Hydrocodone Itching    Family History  Problem Relation Age of Onset   Lung cancer Mother    Clotting disorder Father    Lung cancer Sister    Heart disease Brother    Heart disease Brother    Colon cancer Neg Hx    Breast  cancer Neg Hx     Prior to Admission medications   Medication Sig Start Date End Date Taking? Authorizing Provider  acetaminophen (TYLENOL) 500 MG tablet Take 500-1,000 mg by mouth every 6 (six) hours as needed (for pain.).    [provider]  albuterol (PROVENTIL HFA;VENTOLIN HFA) 108 (90 Base)  MCG/ACT inhaler Inhale 2 puffs into the lungs every 6 (six) hours as needed for wheezing or shortness of breath. 01/01/18   Bhagat, Sharrell Ku, PA  albuterol (PROVENTIL) (2.5 MG/3ML) 0.083% nebulizer solution Inhale 2.5 mg into the lungs 3 (three) times daily as needed for shortness of breath or wheezing. 03/20/20   [provider]  ALPRAZolam Prudy Feeler) 0.5 MG tablet Take 0.5 mg by mouth 3 (three) times daily as needed for anxiety. Patient not taking: Reported on 04/12/2022    [provider]  amiodarone (PACERONE) 100 MG tablet Take 1 tablet (100 mg total) by mouth in the morning. 03/16/22   Nahser, Deloris Ping, MD  apixaban (ELIQUIS) 5 MG TABS tablet Take 1 tablet (5 mg total) by mouth 2 (two) times daily. 01/12/22   Nahser, Deloris Ping, MD  aspirin 81 MG chewable tablet Chew 1 tablet (81 mg total) by mouth daily. 01/12/22   Nahser, Deloris Ping, MD  atorvastatin (LIPITOR) 80 MG tablet Take 1 tablet (80 mg total) by mouth daily. 01/12/22   Nahser, Deloris Ping, MD  buPROPion (WELLBUTRIN XL) 150 MG 24 hr tablet Take 150 mg by mouth daily.    [provider]  ezetimibe (ZETIA) 10 MG tablet Take 10 mg by mouth daily.    [provider]  Ferrous Sulfate Dried (SLOW RELEASE IRON) 45 MG TBCR Take by mouth.    [provider]  furosemide (LASIX) 20 MG tablet Take 1 tablet (20 mg total) by mouth as needed for fluid or edema. 01/12/22   Nahser, Deloris Ping, MD  isosorbide mononitrate (IMDUR) 30 MG 24 hr tablet Take 1 tablet (30 mg total) by mouth daily. 01/12/22   Nahser, Deloris Ping, MD  levothyroxine (SYNTHROID) 50 MCG tablet Take 50 mcg by mouth daily before breakfast.    [provider]  nitroGLYCERIN (NITROSTAT) 0.4 MG SL tablet Place 1 tablet (0.4 mg total) under the tongue every 5 (five) minutes as needed for chest pain. 06/18/21   Nahser, Deloris Ping, MD  oxyCODONE 10 MG TABS Take 1 tablet (10 mg total) by mouth 3 (three) times daily. Patient not taking: Reported on 04/12/2022 07/12/19   Lars Mage, PA-C  pantoprazole (PROTONIX) 40 MG tablet Take 1 tablet (40 mg total) by mouth daily. 01/12/22   Nahser, Deloris Ping, MD  pramipexole (MIRAPEX) 0.25 MG tablet Take 0.25 mg by mouth at bedtime.  02/18/19   [provider]  promethazine (PHENERGAN) 12.5 MG tablet TAKE ONE TABLET BY MOUTH EVERY 6 HOURS AS NEEDED FOR NAUSEA AND VOMITING 11/06/19   Maeola Harman, MD  ranolazine (RANEXA) 1000 MG SR tablet Take 1 tablet (1,000 mg total) by mouth 2 (two) times daily. 01/12/22   Nahser, Deloris Ping, MD  sacubitril-valsartan (ENTRESTO) 49-51 MG Take 1 tablet by mouth 2 (two) times daily. 01/11/22   Nahser, Deloris Ping, MD  sertraline (ZOLOFT) 100 MG tablet Take 150 mg by mouth at bedtime.     [provider]  spironolactone (ALDACTONE) 25 MG tablet Take 1 tablet (25 mg total) by mouth daily. 01/12/22   Nahser, Deloris Ping, MD  Vitamin D, Ergocalciferol, (DRISDOL) 1.25 MG (50000 UNIT) CAPS capsule Take 50,000 Units by mouth every 7 (seven) days.    [provider]    Physical Exam: Vitals:   07/24/22 0702 07/24/22 0800 07/24/22 1055 07/24/22 1100  BP: (!) 97/31 (!) 94/52  (!) 112/41  Pulse: (!) 40 (!) 42  (!) 40  Resp: 16 12  18  Temp: 97.8 F (36.6 C)  98.5 F (36.9 C)   TempSrc:   Oral   SpO2: 100% 100%  100%  Weight:      Height:       General:  Appears calm and comfortable and is in NAD Eyes:  PERRL, EOMI, normal lids, iris ENT:  HOH, lips & tongue, mmm; appropriate dentition Neck:  no LAD, masses or thyromegaly; no carotid bruits Cardiovascular:  RRR, soft systlic murmur No LE edema.  Respiratory:   CTA bilaterally with no  wheezes/rales/rhonchi.  Normal respiratory effort. Abdomen:  soft, NT, ND, NABS Back:   normal alignment, no CVAT Skin:  no rash or induration seen on limited exam Musculoskeletal:  grossly normal tone BUE/BLE, good ROM, no bony abnormality Lower extremity:  No LE edema.  Limited foot exam with no ulcerations.  2+ distal pulses. Psychiatric:  grossly normal mood and affect, speech fluent and appropriate, AOx3 Neurologic:  CN 2-12 grossly intact, moves all extremities in coordinated fashion, sensation intact   Radiological Exams on Admission: Independently reviewed - see discussion in A/P where applicable  CT HEAD WO CONTRAST ( )  Result Date: 07/24/2022 CLINICAL DATA:  Trauma. EXAM: CT HEAD WITHOUT CONTRAST CT CERVICAL SPINE WITHOUT CONTRAST TECHNIQUE: Multidetector CT imaging of the head and cervical spine was performed following the standard protocol without intravenous contrast. Multiplanar CT image reconstructions of the cervical spine were also generated. RADIATION DOSE REDUCTION: This exam was performed according to the departmental dose-optimization program which includes automated exposure control, adjustment of the mA and/or kV according to patient size and/or use of iterative reconstruction technique. COMPARISON:  Head CT dated 09/19/2019. FINDINGS: CT HEAD FINDINGS Brain: Mild age-related atrophy and chronic microvascular ischemic changes. There is no acute intracranial hemorrhage. No mass effect or midline shift. No extra-axial fluid collection. Vascular: No hyperdense vessel or unexpected calcification. Skull: Normal. Negative for fracture or focal lesion. Sinuses/Orbits: There is mucoperiosteal thickening of paranasal sinuses. No air-fluid level. Mild left mastoid effusions. Other: None CT CERVICAL SPINE FINDINGS Alignment: No acute subluxation. There is straightening of normal cervical lordosis which may be positional or due to muscle spasm. Skull base and vertebrae: No acute  fracture.  Osteopenia. Soft tissues and spinal canal: No prevertebral fluid or swelling. No visible canal hematoma. Disc levels: No acute findings. Degenerative changes. C5-C6 ankylosis. Upper chest: Negative. Other: Right thyroid nodule measures 13 mm. This has been evaluated on previous imaging. (ref: J Am Coll Radiol. 2015 Feb;12(2): 143-50). IMPRESSION: 1. No acute intracranial pathology. Mild age-related atrophy and chronic microvascular ischemic changes. 2. No acute/traumatic cervical spine pathology. Electronically Signed   By: Elgie Collard M.D.   On: 07/24/2022 03:35   CT CERVICAL SPINE WO CONTRAST  Result Date: 07/24/2022 CLINICAL DATA:  Trauma. EXAM: CT HEAD WITHOUT CONTRAST CT CERVICAL SPINE WITHOUT CONTRAST TECHNIQUE: Multidetector CT imaging of the head and cervical spine was performed following the standard protocol without intravenous contrast. Multiplanar CT image reconstructions of the cervical spine were also generated. RADIATION DOSE REDUCTION: This exam was performed according to the departmental dose-optimization program which includes automated exposure control, adjustment of the mA and/or kV according to patient size and/or use of iterative reconstruction technique. COMPARISON:  Head CT dated 09/19/2019. FINDINGS: CT HEAD FINDINGS Brain: Mild age-related atrophy and chronic microvascular ischemic changes. There is no acute intracranial hemorrhage. No mass effect or midline shift. No extra-axial fluid collection. Vascular: No hyperdense vessel or unexpected calcification. Skull: Normal. Negative for fracture or focal lesion. Sinuses/Orbits: There  is mucoperiosteal thickening of paranasal sinuses. No air-fluid level. Mild left mastoid effusions. Other: None CT CERVICAL SPINE FINDINGS Alignment: No acute subluxation. There is straightening of normal cervical lordosis which may be positional or due to muscle spasm. Skull base and vertebrae: No acute fracture.  Osteopenia. Soft tissues and  spinal canal: No prevertebral fluid or swelling. No visible canal hematoma. Disc levels: No acute findings. Degenerative changes. C5-C6 ankylosis. Upper chest: Negative. Other: Right thyroid nodule measures 13 mm. This has been evaluated on previous imaging. (ref: J Am Coll Radiol. 2015 Feb;12(2): 143-50). IMPRESSION: 1. No acute intracranial pathology. Mild age-related atrophy and chronic microvascular ischemic changes. 2. No acute/traumatic cervical spine pathology. Electronically Signed   By: Elgie Collard M.D.   On: 07/24/2022 03:35   DG Chest 2 View  Result Date: 07/24/2022 CLINICAL DATA:  Cough EXAM: CHEST - 2 VIEW COMPARISON:  02/21/2022 FINDINGS: Lungs are clear. No pneumothorax or pleural effusion. Coronary artery bypass grafting has been performed. Cardiac size within normal limits. Pulmonary vascularity is normal. No acute bone abnormality. IMPRESSION: 1. No active cardiopulmonary disease. Electronically Signed   By: Helyn Numbers M.D.   On: 07/24/2022 01:56    EKG: Independently reviewed.  irregular with rate 47; nonspecific ST changes with no evidence of acute ischemia   Labs on Admission: I have personally reviewed the available labs and imaging studies at the time of the admission.  Pertinent labs:   potassium: 6.0,  BUN: 40,  creatinine: 1.15,  UDS: THC, opiates, BZD,   Assessment and Plan: Principal Problem:   Hypotension Active Problems:   Sinus bradycardia   PAF (paroxysmal atrial fibrillation) (HCC)   Hyperkalemia   Coronary artery disease involving native coronary artery of native heart with angina pectoris (HCC)   (HFpEF) heart failure with preserved ejection fraction (HCC)   PAD (peripheral artery disease) (HCC)   Essential hypertension   HLD (hyperlipidemia)   Anxiety   Severe major depression, single episode, without psychotic features (HCC)   GERD (gastroesophageal reflux disease)   Restless leg syndrome   COPD (chronic obstructive pulmonary disease)  (HCC)   Sleep apnea   Marijuana abuse    Assessment and Plan: * Hypotension 72 year old female presenting with episode of "jerking of her body" History of this x 4 years, but more severe which prompted her to call 911 found to be hypotensive and bradycardic -given IVF and albumin in ED systolic 79>112 -she is overall asymptomatic with no lightheadedness or dizziness/palpitations or fatigue -unsure if jerking related to hypotension, does eat edibles, not associated with these. No LOC to suspect seizure.  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 Cortisol Am was significantly low. Start 5mg  hydrocortisone BID.  -stop amiodarone -hold imdur, spironolactone and entresto -check TSH and am cortisol  -cardiology consulted for bradycardia  -check CK for jerking episodes, may benefit from neurology w/u outpatient  -on Wellbutrin, but again history not consistent with seizures   Sinus bradycardia -Heart rate averaging in the 40s besides hypotension doesn't appear to be symptomatic  -stop amiodarone -EKG does show dropped beat  -cardiology consulted  -do not think jerking episodes related to her blood pressure or heart rate  -monitor on tele   Hyperkalemia No changes on EKG Have ordered stat repeat after IVF  ? 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 Start hydrocortisone Bid  Cortisol pending  Monitor tele   PAF (paroxysmal atrial fibrillation) (  HCC) Stop amiodarone, cardiology consulted In NSR, continue eliquis   Coronary artery disease involving native coronary artery of native heart with angina pectoris (HCC) NSTEMI 1998 s/p CABG // S/p PCI in 2013 // S/p DES to S-RCA in 01/2015 All grafts occluded - Cath in 05/2019 Troponin wnl  Continue medical management, but hold imdur with hypotension.  NTG prn chest pain, Ranexa 1000 mg twice daily, ASA 81 mg once daily,  Lipitor 80 mg once daily.   (HFpEF) heart failure with preserved ejection fraction (HCC) EF was severely reduced at the time of her MI and PEA arrest after her LE bypass surgery in 2021. This improved to 45-50 in July 2021 and was normal on the echocardiogram done in Nov 2023  Echo 11/23: normal EF. Diastolic indeterminate Hold entresto and spironolactone  Daily weights   PAD (peripheral artery disease) (HCC) S/p L Fem-Fem bypass // S/p Ao-Bifem bypass 06/2019  Continue ASA and high dose statin   Essential hypertension Hypotensive on arrival and episodes of hypotension intermittently at home with systolic to 70s. Doesn't appear to be symptomatic.  -given IVF and albumin by EDP  -check TSH/AM cortisol  -hold imdur, entresto and spironolactone   HLD (hyperlipidemia) Continue zetia and high dose statin   Anxiety Continue zoloft and xanax PRN BID  Severe major depression, single episode, without psychotic features (HCC) Continue wellbutrin daily   GERD (gastroesophageal reflux disease) Continue protonix   Restless leg syndrome Continue mirapex   COPD (chronic obstructive pulmonary disease) (HCC) No signs of exacerbation Continue SABA prn  Sleep apnea States her machine broke and has not reached out for replacement Declines CPAP inpatient F/u with PCP   Marijuana abuse Eats edibles, but states this is not correlated with her jerking episodes Advised to stop     Advance Care Planning:   Code Status: Full Code   Consults: cardiology: Dr. Scharlene Gloss   DVT Prophylaxis: eliquis   Family Communication: none   Severity of Illness: The appropriate patient status for this patient is OBSERVATION. Observation status is judged to be reasonable and necessary in order to provide the required intensity of service to ensure the patient's safety. The patient's presenting symptoms, physical exam findings, and initial radiographic and laboratory data in the context of their medical  condition is felt to place them at decreased risk for further clinical deterioration. Furthermore, it is anticipated that the patient will be medically stable for discharge from the hospital within 2 midnights of admission.   Author: Orland Mustard, MD 07/24/2022 1:28 PM  For on call review [email or web address].

## 2022-07-24 NOTE — Assessment & Plan Note (Signed)
Stop amiodarone, cardiology consulted In NSR, continue eliquis

## 2022-07-24 NOTE — ED Notes (Signed)
Provider at bedside

## 2022-07-24 NOTE — ED Notes (Signed)
Spoke to EDP, plan to pause fluids at this time

## 2022-07-24 NOTE — ED Triage Notes (Signed)
Presents via [facility] for fall on  Called out for "shaking". Arrives to patient reporting a recent URI for the past week and noted the patient was emotionally labile. At one point states to EMS that she does not want to go to the hospital, throws herself backwards into wall which triggered EMS to activated level 2. Denies HA or any other injury  EMS exam- HR 40-44bpm prior to , 87% RA when sleeping, 95% while awake on RA, 114/70, CBG 124. 20G AC  Takes oxycodone as part of home meds.

## 2022-07-24 NOTE — ED Notes (Signed)
With pt's permission, this RN updated pt's son Wendy [name].

## 2022-07-24 NOTE — ED Notes (Signed)
NAD noted, respirations are equal bilaterally and unlabored at this time. Pt resting in gurney and denies any unmet needs. Pt connected to CCM, pulseox & BP. Call light within reach. 

## 2022-07-24 NOTE — ED Provider Notes (Addendum)
[place] EMERGENCY DEPARTMENT AT [facility] Provider Note   CSN: [id number] Arrival date & time: 07/24/22  4098     History  Chief Complaint  Patient presents with   Marletta Lor    Wendy [name] is a 72 y.o. female.  Patient presents to the emergency department with multiple complaints.  Patient reportedly had not been feeling well for several days because of URI type symptoms.  Patient reports that she started feeling very shaky tonight and called EMS.  While EMS was evaluating her she fell back onto the bed and hit her head on the wall.  No loss of consciousness.  She is on blood thinners.  Arrival to the ER, however, patient has no active complaints.  She has noted to be very groggy on arrival, does use chronic narcotic analgesia.       Home Medications Prior to Admission medications   Medication Sig Start Date End Date Taking? Authorizing Provider  acetaminophen (TYLENOL) 500 MG tablet Take 500-1,000 mg by mouth every 6 (six) hours as needed (for pain.).    [provider]  albuterol (PROVENTIL HFA;VENTOLIN HFA) 108 (90 Base) MCG/ACT inhaler Inhale 2 puffs into the lungs every 6 (six) hours as needed for wheezing or shortness of breath. 01/01/18   Bhagat, Sharrell Ku, PA  albuterol (PROVENTIL) (2.5 MG/3ML) 0.083% nebulizer solution Inhale 2.5 mg into the lungs 3 (three) times daily as needed for shortness of breath or wheezing. 03/20/20   [provider]  ALPRAZolam Prudy Feeler) 0.5 MG tablet Take 0.5 mg by mouth 3 (three) times daily as needed for anxiety. Patient not taking: Reported on 04/12/2022    [provider]  amiodarone (PACERONE) 100 MG tablet Take 1 tablet (100 mg total) by mouth in the morning. 03/16/22   Nahser, Deloris Ping, MD  apixaban (ELIQUIS) 5 MG TABS tablet Take 1 tablet (5 mg total) by mouth 2 (two) times daily. 01/12/22   Nahser, Deloris Ping, MD  aspirin 81 MG chewable tablet Chew 1 tablet (81 mg total) by mouth daily. 01/12/22   Nahser,  Deloris Ping, MD  atorvastatin (LIPITOR) 80 MG tablet Take 1 tablet (80 mg total) by mouth daily. 01/12/22   Nahser, Deloris Ping, MD  buPROPion (WELLBUTRIN XL) 150 MG 24 hr tablet Take 150 mg by mouth daily.    [provider]  ezetimibe (ZETIA) 10 MG tablet Take 10 mg by mouth daily.    [provider]  Ferrous Sulfate Dried (SLOW RELEASE IRON) 45 MG TBCR Take by mouth.    [provider]  furosemide (LASIX) 20 MG tablet Take 1 tablet (20 mg total) by mouth as needed for fluid or edema. 01/12/22   Nahser, Deloris Ping, MD  isosorbide mononitrate (IMDUR) 30 MG 24 hr tablet Take 1 tablet (30 mg total) by mouth daily. 01/12/22   Nahser, Deloris Ping, MD  levothyroxine (SYNTHROID) 50 MCG tablet Take 50 mcg by mouth daily before breakfast.    [provider]  nitroGLYCERIN (NITROSTAT) 0.4 MG SL tablet Place 1 tablet (0.4 mg total) under the tongue every 5 (five) minutes as needed for chest pain. 06/18/21   Nahser, Deloris Ping, MD  oxyCODONE 10 MG TABS Take 1 tablet (10 mg total) by mouth 3 (three) times daily. Patient not taking: Reported on 04/12/2022 07/12/19   Lars Mage, PA-C  pantoprazole (PROTONIX) 40 MG tablet Take 1 tablet (40 mg total) by mouth daily. 01/12/22   Nahser, Deloris Ping, MD  pramipexole (MIRAPEX) 0.25  MG tablet Take 0.25 mg by mouth at bedtime.  02/18/19   [provider]  promethazine (PHENERGAN) 12.5 MG tablet TAKE ONE TABLET BY MOUTH EVERY 6 HOURS AS NEEDED FOR NAUSEA AND VOMITING 11/06/19   Maeola Harman, MD  ranolazine (RANEXA) 1000 MG SR tablet Take 1 tablet (1,000 mg total) by mouth 2 (two) times daily. 01/12/22   Nahser, Deloris Ping, MD  sacubitril-valsartan (ENTRESTO) 49-51 MG Take 1 tablet by mouth 2 (two) times daily. 01/11/22   Nahser, Deloris Ping, MD  sertraline (ZOLOFT) 100 MG tablet Take 150 mg by mouth at bedtime.     [provider]  spironolactone (ALDACTONE) 25 MG tablet Take 1 tablet (25 mg total) by mouth daily. 01/12/22   Nahser,  Deloris Ping, MD  Vitamin D, Ergocalciferol, (DRISDOL) 1.25 MG (50000 UNIT) CAPS capsule Take 50,000 Units by mouth every 7 (seven) days.    [provider]      Allergies    Benadryl [diphenhydramine], Penicillins, Adhesive [tape], Amoxicillin, Hydrocodone-acetaminophen, Cyclobenzaprine, and Hydrocodone    Review of Systems   Review of Systems  Physical Exam Updated Vital Signs BP (!) 91/40   Pulse (!) 39   Temp 98.3 F (36.8 C) (Axillary)   Resp 15   Ht 5\' 1"  (1.549 m)   Wt 53 kg   SpO2 100%   BMI 22.08 kg/m  Physical Exam Vitals and nursing note reviewed.  Constitutional:      General: She is not in acute distress.    Appearance: She is well-developed.  HENT:     Head: Normocephalic and atraumatic.     Mouth/Throat:     Mouth: Mucous membranes are moist.  Eyes:     General: Vision grossly intact. Gaze aligned appropriately.     Extraocular Movements: Extraocular movements intact.     Conjunctiva/sclera: Conjunctivae normal.  Cardiovascular:     Rate and Rhythm: Regular rhythm. Bradycardia present.     Pulses: Normal pulses.     Heart sounds: Normal heart sounds, S1 normal and S2 normal. No murmur heard.    No friction rub. No gallop.  Pulmonary:     Effort: Pulmonary effort is normal. No respiratory distress.     Breath sounds: Normal breath sounds.  Abdominal:     General: Bowel sounds are normal.     Palpations: Abdomen is soft.     Tenderness: There is no abdominal tenderness. There is no guarding or rebound.     Hernia: No hernia is present.  Musculoskeletal:        General: No swelling.     Cervical back: Full passive range of motion without pain, normal range of motion and neck supple. No spinous process tenderness or muscular tenderness. Normal range of motion.     Right lower leg: No edema.     Left lower leg: No edema.  Skin:    General: Skin is warm and dry.     Capillary Refill: Capillary refill takes less than 2 seconds.     Findings: No  ecchymosis, erythema, rash or wound.  Neurological:     General: No focal deficit present.     Mental Status: She is alert.     GCS: GCS eye subscore is 4. GCS verbal subscore is 5. GCS motor subscore is 6.     Cranial Nerves: Cranial nerves 2-12 are intact.     Sensory: Sensation is intact.     Motor: Motor function is intact.     Coordination:  Coordination is intact.     Comments: slow to respond but is oriented  Psychiatric:        Attention and Perception: Attention normal.        Mood and Affect: Mood normal.        Speech: Speech normal.        Behavior: Behavior normal.     ED Results / Procedures / Treatments   Labs (all labs ordered are listed, but only abnormal results are displayed) Labs Reviewed  CBC WITH DIFFERENTIAL/PLATELET - Abnormal; Notable for the following components:      Result Value   Hemoglobin 11.8 (*)    All other components within normal limits  COMPREHENSIVE METABOLIC PANEL - Abnormal; Notable for the following components:   Potassium 6.0 (*)    CO2 20 (*)    BUN 40 (*)    Creatinine, Ser 1.15 (*)    Calcium 8.5 (*)    Total Protein 5.9 (*)    Albumin 2.8 (*)    GFR, Estimated 51 (*)    All other components within normal limits  URINALYSIS, ROUTINE W REFLEX MICROSCOPIC - Abnormal; Notable for the following components:   Glucose, UA 150 (*)    All other components within normal limits  RAPID URINE DRUG SCREEN, HOSP PERFORMED - Abnormal; Notable for the following components:   Opiates POSITIVE (*)    Benzodiazepines POSITIVE (*)    Tetrahydrocannabinol POSITIVE (*)    All other components within normal limits  SARS CORONAVIRUS 2 BY RT PCR  ETHANOL  AMMONIA  LACTIC ACID, PLASMA  TROPONIN I (HIGH SENSITIVITY)    EKG EKG Interpretation  Date/Time:  Sunday Jul 24 2022 01:12:55 EDT Ventricular Rate:  47 PR Interval:  127 QRS Duration: 109 QT Interval:  520 QTC Calculation: 460 R Axis:   66 Text Interpretation: Bradycardia with  irregular rate Probable anteroseptal infarct, recent Confirmed by Gilda Crease [id number]) on 07/24/2022 5:15:36 AM  Radiology CT HEAD WO CONTRAST ( )  Result Date: 07/24/2022 CLINICAL DATA:  Trauma. EXAM: CT HEAD WITHOUT CONTRAST CT CERVICAL SPINE WITHOUT CONTRAST TECHNIQUE: Multidetector CT imaging of the head and cervical spine was performed following the standard protocol without intravenous contrast. Multiplanar CT image reconstructions of the cervical spine were also generated. RADIATION DOSE REDUCTION: This exam was performed according to the departmental dose-optimization program which includes automated exposure control, adjustment of the mA and/or kV according to patient size and/or use of iterative reconstruction technique. COMPARISON:  Head CT dated 09/19/2019. FINDINGS: CT HEAD FINDINGS Brain: Mild age-related atrophy and chronic microvascular ischemic changes. There is no acute intracranial hemorrhage. No mass effect or midline shift. No extra-axial fluid collection. Vascular: No hyperdense vessel or unexpected calcification. Skull: Normal. Negative for fracture or focal lesion. Sinuses/Orbits: There is mucoperiosteal thickening of paranasal sinuses. No air-fluid level. Mild left mastoid effusions. Other: None CT CERVICAL SPINE FINDINGS Alignment: No acute subluxation. There is straightening of normal cervical lordosis which may be positional or due to muscle spasm. Skull base and vertebrae: No acute fracture.  Osteopenia. Soft tissues and spinal canal: No prevertebral fluid or swelling. No visible canal hematoma. Disc levels: No acute findings. Degenerative changes. C5-C6 ankylosis. Upper chest: Negative. Other: Right thyroid nodule measures 13 mm. This has been evaluated on previous imaging. (ref: J Am Coll Radiol. 2015 Feb;12(2): 143-50). IMPRESSION: 1. No acute intracranial pathology. Mild age-related atrophy and chronic microvascular ischemic changes. 2. No acute/traumatic cervical  spine pathology. Electronically Signed   By: Burtis Junes  Radparvar M.D.   On: 07/24/2022 03:35   CT CERVICAL SPINE WO CONTRAST  Result Date: 07/24/2022 CLINICAL DATA:  Trauma. EXAM: CT HEAD WITHOUT CONTRAST CT CERVICAL SPINE WITHOUT CONTRAST TECHNIQUE: Multidetector CT imaging of the head and cervical spine was performed following the standard protocol without intravenous contrast. Multiplanar CT image reconstructions of the cervical spine were also generated. RADIATION DOSE REDUCTION: This exam was performed according to the departmental dose-optimization program which includes automated exposure control, adjustment of the mA and/or kV according to patient size and/or use of iterative reconstruction technique. COMPARISON:  Head CT dated 09/19/2019. FINDINGS: CT HEAD FINDINGS Brain: Mild age-related atrophy and chronic microvascular ischemic changes. There is no acute intracranial hemorrhage. No mass effect or midline shift. No extra-axial fluid collection. Vascular: No hyperdense vessel or unexpected calcification. Skull: Normal. Negative for fracture or focal lesion. Sinuses/Orbits: There is mucoperiosteal thickening of paranasal sinuses. No air-fluid level. Mild left mastoid effusions. Other: None CT CERVICAL SPINE FINDINGS Alignment: No acute subluxation. There is straightening of normal cervical lordosis which may be positional or due to muscle spasm. Skull base and vertebrae: No acute fracture.  Osteopenia. Soft tissues and spinal canal: No prevertebral fluid or swelling. No visible canal hematoma. Disc levels: No acute findings. Degenerative changes. C5-C6 ankylosis. Upper chest: Negative. Other: Right thyroid nodule measures 13 mm. This has been evaluated on previous imaging. (ref: J Am Coll Radiol. 2015 Feb;12(2): 143-50). IMPRESSION: 1. No acute intracranial pathology. Mild age-related atrophy and chronic microvascular ischemic changes. 2. No acute/traumatic cervical spine pathology. Electronically Signed    By: Elgie Collard M.D.   On: 07/24/2022 03:35   DG Chest 2 View  Result Date: 07/24/2022 CLINICAL DATA:  Cough EXAM: CHEST - 2 VIEW COMPARISON:  02/21/2022 FINDINGS: Lungs are clear. No pneumothorax or pleural effusion. Coronary artery bypass grafting has been performed. Cardiac size within normal limits. Pulmonary vascularity is normal. No acute bone abnormality. IMPRESSION: 1. No active cardiopulmonary disease. Electronically Signed   By: Helyn Numbers M.D.   On: 07/24/2022 01:56    Procedures Procedures    Medications Ordered in ED Medications  lactated ringers bolus 1,000 mL (0 mLs Intravenous Stopped 07/24/22 0450)  lactated ringers bolus 1,000 mL (0 mLs Intravenous Stopped 07/24/22 0548)    ED Course/ Medical Decision Making/ A&P                             Medical Decision Making Amount and/or Complexity of Data Reviewed External Data Reviewed: labs, radiology, ECG and notes. Labs: ordered. Decision-making details documented in ED Course. Radiology: ordered and independent interpretation performed. Decision-making details documented in ED Course. ECG/medicine tests: ordered and independent interpretation performed. Decision-making details documented in ED Course.   Differential diagnosis considered includes, but not limited to: TIA; Stroke; ICH; Seizure; electrolyte abnormality; hypoglycemia; toxic/pharmacologic causes; CNS infection; psychiatric disorder  Patient initially called EMS because she had an episode of shaking.  She reports that she is awake and alert when this occurs but her whole body feels like it is "going into convulsions".  She reports that this has happened approximately 10 times over the last 4 years.  Tonight, however, was the worst.  When EMS got to the home she was a little altered.  She was unable to stand up on her own, fell backwards and hit her head on the wall.  She is on blood thinners because of a history of atrial fibrillation.  CT head  does not  show any intracranial injury.  Patient somnolent at arrival.  She initially appeared overmedicated.  Vital signs were fairly normal at arrival but she has become hypotensive and bradycardic here.  I did review multiple records.  She was seen by cardiology and at that time it was documented that she has labile blood pressures, sometimes blood pressures in the 70s and 80s systolic and other times hypertensive.  Additionally, it appears that her heart rate is about 55 normally.  She is on Pacerone chronically.  Patient did appear dehydrated.  She was given a fluid bolus.  This did not significantly affect her blood pressure or heart rate.  She is now much more awake and alert.  She reports that she feels "lightheaded and woozy".  No focal complaints.  She did ambulate in the room.  She was very unsteady, required 2 people to help her ambulate.  Heart rate went up into the 60s when she was ambulating but quickly dropped back down into the 40 range when back in the bed.  Blood pressure did not change much with ambulation.  Suspect that her bradycardia secondary to her meds.  I also suspect that her confusion at arrival was secondary to medication.  She does take oxycodone as well as Xanax at night.  She also reports that she sometimes takes "1 of those edibles"  The shaking spells are of unclear etiology.  She does have a history of cardiac arrest.  This was in the setting of MI.  She required left ventricular assist device for several days and had a significantly reduced ejection fraction.  This, however, has returned to normal with most recent echo.  Feel that she should be monitored on telemetry, perhaps have medication adjustment to see if her vitals and difficulty with ambulation improve.  Addendum: I did discuss briefly with Dr. Craige Cotta, on-call for critical care.  He did not feel that the patient requires ICU admission, patient can be admitted by hospitalist to progressive bed.      Final Clinical  Impression(s) / ED Diagnoses Final diagnoses:  Bradycardia  Altered mental status, unspecified altered mental status type    Rx / DC Orders ED Discharge Orders     None         Linley Moskal, Canary Brim, MD 07/24/22 4098    Gilda Crease, MD 07/24/22 [phone]

## 2022-07-24 NOTE — ED Notes (Signed)
Pt returned from imaging.

## 2022-07-24 NOTE — Progress Notes (Signed)
   07/24/22 0140  Spiritual Encounters  Type of Visit Initial  Care provided to: Patient  Conversation partners present during encounter Nurse  Referral source Trauma page  Reason for visit Trauma  OnCall Visit Yes   Chap responded to Trauma 2 page to find PT alert and taking to the medical staff.  PT seemed slow in speech and when I asked who was coming to the hospital to be with her she stated her son was coming but could not remember his name.  No support persons  came.  PT was downgraded to non-trauma. Chaplain remains available by page.

## 2022-07-24 NOTE — Assessment & Plan Note (Addendum)
Continue zoloft and xanax PRN BID

## 2022-07-24 NOTE — Assessment & Plan Note (Signed)
States her machine broke and has not reached out for replacement Declines CPAP inpatient F/u with PCP

## 2022-07-24 NOTE — ED Notes (Signed)
EDP aware of BP, no intervention

## 2022-07-24 NOTE — Assessment & Plan Note (Signed)
No signs of exacerbation  Continue SABA prn  

## 2022-07-24 NOTE — ED Triage Notes (Signed)
Downgraded to nontrauma at this time by EDP

## 2022-07-24 NOTE — Assessment & Plan Note (Signed)
Continue wellbutrin daily.  

## 2022-07-24 NOTE — Assessment & Plan Note (Signed)
Continue mirapex.   

## 2022-07-24 NOTE — ED Notes (Signed)
Aware of need for urine sample when able to provide

## 2022-07-24 NOTE — Assessment & Plan Note (Signed)
Hypotensive on arrival and episodes of hypotension intermittently at home with systolic to 70s. Doesn't appear to be symptomatic.  -given IVF and albumin by EDP  -check TSH/AM cortisol  -hold imdur, entresto and spironolactone

## 2022-07-24 NOTE — ED Notes (Signed)
Pt placed on bed pan to void. Bed sheets changed and pt cleaned.

## 2022-07-24 NOTE — ED Notes (Signed)
Rolling call made to IP RN, IP RN ready for pt. PT well appearing upon transport to floor.

## 2022-07-24 NOTE — Assessment & Plan Note (Signed)
Eats edibles, but states this is not correlated with her jerking episodes Advised to stop

## 2022-07-24 NOTE — Assessment & Plan Note (Signed)
Continue zetia and high dose statin  

## 2022-07-24 NOTE — ED Notes (Signed)
Pt currently on bedpan attempting to provide urine sample, declines in/out cath at this time

## 2022-07-24 NOTE — Assessment & Plan Note (Addendum)
No changes on EKG Have ordered stat repeat after IVF  ? 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 Start hydrocortisone Bid  Cortisol pending  Monitor tele

## 2022-07-24 NOTE — Assessment & Plan Note (Signed)
-  Heart rate averaging in the 40s besides hypotension doesn't appear to be symptomatic  -stop amiodarone -EKG does show dropped beat  -cardiology consulted  -do not think jerking episodes related to her blood pressure or heart rate  -monitor on tele

## 2022-07-24 NOTE — Assessment & Plan Note (Addendum)
72 year old female presenting with episode of "jerking of her body" History of this x 4 years, but more severe which prompted her to call 911 found to be hypotensive and bradycardic -given IVF and albumin in ED systolic 79>112 -she is overall asymptomatic with no lightheadedness or dizziness/palpitations or fatigue -unsure if jerking related to hypotension, does eat edibles, not associated with these. No LOC to suspect seizure.  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 Cortisol Am was significantly low. Start 5mg  hydrocortisone BID.  -stop amiodarone -hold imdur, spironolactone and entresto -check TSH and am cortisol  -cardiology consulted for bradycardia  -check CK for jerking episodes, may benefit from neurology w/u outpatient  -on Wellbutrin, but again history not consistent with seizures

## 2022-07-24 NOTE — ED Notes (Signed)
This RN assumed care of patient and received off going transfer of care report from off going RN. Pt presented to the ED after hitting head on wall from home. Pt is resting on gurney at this time, respirations are spontaneous, even, unlabored and symmetrical bilaterally. Pt skin tone is appropriate for ethnicity, dry and warm. Pt connected to CCM, pulse ox and BP.  Call light at bedside.

## 2022-07-24 NOTE — Consult Note (Signed)
Cardiology Consultation   Patient ID: MANAMI LAMIA MRN: [id number]; DOB: 1950/06/04  Admit date: 07/24/2022 Date of Consult: 07/24/2022  PCP:  Jac Canavan, PA-C   [facility] Providers Cardiologist:  Kristeen Miss, MD   {  Patient Profile:   KISSY FELLER is a 72 y.o. female with a history of CAD with NSTEMI and 1998 s/p CABG x3  (LIMA-LAD, SVG-OM2, SVG-RCA) with subsequent PCI in 2013 and DES to SVG to RCA in 01/2015, chronic combined CHF with EF as low as 20-25% in 06/2019 but improved to 55-60% on last Echo in 01/2022, paroxysmal atrial fibrillation on amiodarone and Eliquis, PAD s/p prior left to right fem-fem bypass and more recently aortic bifemoral bypass and 06/2019, mild bilateral carotid stenosis, COPD, hypertension, hyperlipidemia, prior DVT, obstructive sleep apnea on CPAP, and prior polysubstance abuse who is being seen 07/24/2022 for the evaluation of bradycardia at the request of Dr. Sheppard Penton.  History of Present Illness:   Ms. Lillo is a 72 year old female with the above history.  She has a long history of CAD dating back to 1998 when she was noted with NSTEMI and underwent CABG x 3 with LIMA to LAD, SVG to OM 2, and SVG to RCA.  Had subsequent PCI in 2013 (unclear to what vessel) and then DES to SVG to RCA in 01/2015.  She subsequently had occlusion of all 3 grafts.  He also has not known PAD with prior left or right femoral-femoral bypass. More recently, she underwent aortobifemoral bypass in 06/2019 with Dr. Randie Heinz.  Postop course was complicated by PEA arrest.  Stat Echo showed LVEF of 20-25% with global hypokinesis. EF down from 55 to 65% from just a week earlier.  Impella was placed by CT surgery and then she underwent LHC which showed stable three-vessel CAD with known occlusion of all 3 grafts.  Postop course was also complicated by a pseudoaneurysm arising from the left brachial artery requiring surgical repair and acute blood loss anemia requiring multiple  transfusions of  PRBCs.  She was admitted in 09/2019 for acute on chronic combined CHF.  Echo showed LVEF of 45-50% with hypokinesis of the basal inferior and inferior septal walls and grade 1 diastolic dysfunction.  She was diuresed admission and GDMT was adjusted.  Unfortunately, she was readmitted later that month with acute cephalopathy which is felt to be due to hypotension in the setting of multiple changes to her antihypertensive regimen. Medications were again adjusted.  Last Echo in 01/2022 showed LVEF of 55-60% with no regional wall motion abnormalities, low normal RV function, and no significant valvular disease.  Patient was last seen by Tereso Newcomer, PA-C, in 02/2022 which time she reported significant shortness of breath with minimal exertion but this was a chronic issue and was stable. BNP was minimally elevated at that time and she was started on Jardiance.   Patient presented to the ED earlier this morning for further evaluation of "jerking/ shaking." Patient states she has a history of intermittent "jerking" episodes for the last for years. These episodes occur rarely (less than once per month) and usually last for about 3-4 hours. She started having another jerking episode last night around 11pm but it was much worse than usual. She states when these episodes occur she cannot walk or talk but she never loses consciousness. Patient denies any chest pain, shortness of breath, palpitations, lightheadedness, or dizziness with the jerking. She denies any history of seizures and denies any tongue biting or  bowel or bladder incontinence. She asked her nephew to call 911 because this episode was so bad. Per notes in the ED, when EMS arrived she did not want to come to the hospital and three herself backwards hitting her head on the wall. She also describes a couple of "angina attacks" that she describes as neck pain since the jerking episode. However, no chest pain. She has chronic shortness of breath  both at rest and with exertion. She states she did have some worsening shortness of breath yesterday afternoon/ evening but otherwise this has been stable. No orthopnea, PND, or edema. No significant palpitations recently.  No lightheadedness, dizziness, or syncope. She reports a recent URI with productive cough. She was treated with antibiotics about 2 weeks ago but has persistent cough. No fevers or illnesses. No GI symptoms. No abnormal bleeding in urine or stools.  In the ED, patient hypotensive and bradycardic. BP as low as 74/42. EKG showed sinus bradycardia, rate 47 bpm, with some underlying baseline wandering/ artifact but no acute ST/T changes. Initial high-sensitivity troponin negative. Chest x-ray showed no acute findings. WBC 7.6, Hgb 11.8, Plts 161. Na 137, K 6.0, Glucose 98, BUN 40, Cr 1.15. Total protein 5.9 and albumin 2.8 but LFTs normal. Head/ neck CT showed no acute findings. ETOH and Ammonia levels normal. Urine drug screen positive for opiates, benzodiazepines, and THC. Patient was started on IV fluids and admitted to medicine service. Cardiology was consulted for further evaluation of her bradycardia.  At the time of this evaluation, patient is resting comfortably in no acute distress. She is currently pain free and feels back to her baseline. BP has improved with fluids. She states she has never checked her BP during one of these jerking episodes so wonder if they are due to hypotension. She states her BP is usually well controlled but can be quite labile at times. Systolic BP has been as low as the high 70s and as high as the 200s in the past but is usually well controlled in the 120s.   Past Medical History:  Diagnosis Date   Anemia    Anginal pain (HCC)    jaw pain 07/2016, s/p 07/15/16 nuclear stress test   Anxiety    Arthritis    "lower back" (08/24/2017)   Asthma    Chornic Obstructive Pulmonary Disease    Chronic back pain    Chronic combined systolic and diastolic CHF     EF 5/18: 60-65 // EF 4/21: 55-60 // EF 4/21: 20-25   Chronic leg pain    Chronic lower back pain    Coronary Artery Disease    a. s/p NSTEMI >> CABG 1998 // s/p PCI 2013 // LHC 01/2015: DES to SVG-RCA.// Cath 05/2019: all grafts occluded; mod non-obs LAD disease w L-L and L-R collats - med Rx // post Ao-Bifem bypass MI c/b PEA, CGS requiring Impella >> anatomy unchanged - Med Rx   Depression    Fall 08/2016   Gastroesophageal reflux disease    Glucose intolerance    Headache    "a few/month" (08/24/2017)   HLD (hyperlipidemia)    Hx of DVT X 3   RLE   Hypertension    Myocardial infarction (HCC) 1999   OSA on CPAP    Paroxysmal atrial fibrillation    a. 09/2013 post-op b. 01/2015 // Apixaban // recurrent AFib post MI after Ao-Bifem in 06/2019 >> Amio Rx   Peripheral artery disease    s/p L fem-fem bypass //  s/p Ao-Bifem bypass in 06/2019   Peripheral neuropathy    Pneumonia    Shortness of breath    when walking   Wears glasses     Past Surgical History:  Procedure Laterality Date   ABDOMINAL AORTOGRAM W/LOWER EXTREMITY Bilateral 04/29/2019   Procedure: ABDOMINAL AORTOGRAM W/LOWER EXTREMITY;  Surgeon: Maeola Harman, MD;  Location: [facility] INVASIVE CV LAB;  Service: Cardiovascular;  Laterality: Bilateral;   ANTERIOR CERVICAL DECOMP/DISCECTOMY FUSION  1991   AORTA - BILATERAL FEMORAL ARTERY BYPASS GRAFT N/A 06/18/2019   Procedure: AORTA BIFEMORAL BYPASS GRAFT;  Surgeon: Maeola Harman, MD;  Location: [facility] OR;  Service: Vascular;  Laterality: N/A;   ARTERY REPAIR Left 06/30/2019   Procedure: LEFT BRACHIAL ARTERY REPAIR, EVACUATION OF HEMATOMA;  Surgeon: Nada Libman, MD;  Location: [facility] OR;  Service: Vascular;  Laterality: Left;   BACK SURGERY     CARDIAC CATHETERIZATION N/A 01/22/2015   Procedure: Left Heart Cath and Coronary Angiography;  Surgeon: Lennette Bihari, MD;  Location: [facility] INVASIVE CV LAB;  Service: Cardiovascular;  Laterality: N/A;   CARDIAC CATHETERIZATION  N/A 01/22/2015   Procedure: Coronary Stent Intervention;  Surgeon: Lennette Bihari, MD;  Location: [facility] INVASIVE CV LAB;  Service: Cardiovascular;  Laterality: N/A;  3.0x12 xience prox SVG to RCA   CARDIAC CATHETERIZATION  03/09/2000   hx/notes 03/20/2000   CHOLECYSTECTOMY N/A 09/16/2013   Procedure: LAPAROSCOPIC CHOLECYSTECTOMY CONVERTED TO OPEN CHOLECYSTECTOMY WITH CHOLANGIOGRAM;  Surgeon: Shelly Rubenstein, MD;  Location: [facility] OR;  Service: General;  Laterality: N/A;   CORONARY ANGIOGRAPHY N/A 06/27/2019   Procedure: CORONARY ANGIOGRAPHY (CATH LAB);  Surgeon: Tonny Bollman, MD;  Location: [facility] INVASIVE CV LAB;  Service: Cardiovascular;  Laterality: N/A;   CORONARY ANGIOPLASTY WITH STENT PLACEMENT  11/2011, 02/2012, 01/2015   DES to SVG-RCA both times, In [place], [place], Dr. Unice Cobble   CORONARY ARTERY BYPASS GRAFT  1998   LIMA-LAD, SVG-OM, SVG-RCA   ERCP N/A 09/19/2013   Procedure: ENDOSCOPIC RETROGRADE CHOLANGIOPANCREATOGRAPHY (ERCP);  Surgeon: Louis Meckel, MD;  Location: [facility] ENDOSCOPY;  Service: Endoscopy;  Laterality: N/A;   FINGER SURGERY Right    ring finger "fused"   FRACTURE SURGERY     IRRIGATION AND DEBRIDEMENT ABSCESS Right 10/17/2013   Procedure: INCISION AND DRAINAGE RIGHT SUBCOSTAL WOUND;  Surgeon: Kandis Cocking, MD;  Location: [facility] ORS;  Service: General;  Laterality: Right;   JOINT REPLACEMENT     KNEE ARTHROSCOPY Right 1998   LEFT HEART CATH AND CORS/GRAFTS ANGIOGRAPHY N/A 07/25/2016   Procedure: Left Heart Cath and Cors/Grafts Angiography;  Surgeon: Corky Crafts, MD;  Location: [facility] INVASIVE CV LAB;  Service: Cardiovascular;  Laterality: N/A;   LEFT HEART CATH AND CORS/GRAFTS ANGIOGRAPHY N/A 06/05/2019   Procedure: LEFT HEART CATH AND CORS/GRAFTS ANGIOGRAPHY;  Surgeon: Kathleene Hazel, MD;  Location: [facility] INVASIVE CV LAB;  Service: Cardiovascular;  Laterality: N/A;   LEFT HEART CATHETERIZATION WITH CORONARY/GRAFT ANGIOGRAM N/A 07/19/2013   Procedure: LEFT HEART  CATHETERIZATION WITH Isabel Caprice;  Surgeon: Marykay Lex, MD;  Location: [facility] CATH LAB;  Service: Cardiovascular;  Laterality: N/A;   MASS EXCISION Right 06/25/2014   Procedure: EXCISION BUTTOCK MASS ;  Surgeon: Abigail Miyamoto, MD;  Location: [facility];  Service: General;  Laterality: Right;   ORIF HUMERUS FRACTURE Left 08/27/2013   Procedure: OPEN REDUCTION INTERNAL FIXATION (ORIF) LEFT HUMERUS ;  Surgeon: Budd Palmer, MD;  Location: [facility] OR;  Service: Orthopedics;  Laterality: Left;   PLACEMENT OF  IMPELLA LEFT VENTRICULAR ASSIST DEVICE N/A 06/24/2019   Procedure: PLACEMENT OF IMPELLA LEFT VENTRICULAR ASSIST DEVICE;  Surgeon: Linden Dolin, MD;  Location: [facility] OR;  Service: Open Heart Surgery;  Laterality: N/A;   POSTERIOR FUSION CERVICAL SPINE  1992   REMOVAL OF IMPELLA LEFT VENTRICULAR ASSIST DEVICE Right 06/28/2019   Procedure: REMOVAL OF IMPELLA LEFT VENTRICULAR ASSIST DEVICE;  Surgeon: Linden Dolin, MD;  Location: [facility] OR;  Service: Open Heart Surgery;  Laterality: Right;   THROMBECTOMY / EMBOLECTOMY FEMORAL ARTERY Right    hx/notes 03/20/2000   TOTAL HIP ARTHROPLASTY Right 07/19/2016   Procedure: TOTAL HIP ARTHROPLASTY ANTERIOR APPROACH;  Surgeon: Sheral Apley, MD;  Location: [facility] OR;  Service: Orthopedics;  Laterality: Right;   TOTAL HIP REVISION Right 08/22/2017   Procedure: TOTAL HIP REVISION;  Surgeon: Sheral Apley, MD;  Location: [facility] OR;  Service: Orthopedics;  Laterality: Right;     Home Medications:  Prior to Admission medications   Medication Sig Start Date End Date Taking? Authorizing Provider  acetaminophen (TYLENOL) 500 MG tablet Take 500-1,000 mg by mouth every 6 (six) hours as needed (for pain.).    [provider]  albuterol (PROVENTIL HFA;VENTOLIN HFA) 108 (90 Base) MCG/ACT inhaler Inhale 2 puffs into the lungs every 6 (six) hours as needed for wheezing or shortness of breath. 01/01/18   Bhagat, Sharrell Ku, PA  albuterol  (PROVENTIL) (2.5 MG/3ML) 0.083% nebulizer solution Inhale 2.5 mg into the lungs 3 (three) times daily as needed for shortness of breath or wheezing. 03/20/20   [provider]  ALPRAZolam Prudy Feeler) 0.5 MG tablet Take 0.5 mg by mouth 3 (three) times daily as needed for anxiety. Patient not taking: Reported on 04/12/2022    [provider]  amiodarone (PACERONE) 100 MG tablet Take 1 tablet (100 mg total) by mouth in the morning. 03/16/22   Nahser, Deloris Ping, MD  apixaban (ELIQUIS) 5 MG TABS tablet Take 1 tablet (5 mg total) by mouth 2 (two) times daily. 01/12/22   Nahser, Deloris Ping, MD  aspirin 81 MG chewable tablet Chew 1 tablet (81 mg total) by mouth daily. 01/12/22   Nahser, Deloris Ping, MD  atorvastatin (LIPITOR) 80 MG tablet Take 1 tablet (80 mg total) by mouth daily. 01/12/22   Nahser, Deloris Ping, MD  buPROPion (WELLBUTRIN XL) 150 MG 24 hr tablet Take 150 mg by mouth daily.    [provider]  ezetimibe (ZETIA) 10 MG tablet Take 10 mg by mouth daily.    [provider]  Ferrous Sulfate Dried (SLOW RELEASE IRON) 45 MG TBCR Take by mouth.    [provider]  furosemide (LASIX) 20 MG tablet Take 1 tablet (20 mg total) by mouth as needed for fluid or edema. 01/12/22   Nahser, Deloris Ping, MD  isosorbide mononitrate (IMDUR) 30 MG 24 hr tablet Take 1 tablet (30 mg total) by mouth daily. 01/12/22   Nahser, Deloris Ping, MD  levothyroxine (SYNTHROID) 50 MCG tablet Take 50 mcg by mouth daily before breakfast.    [provider]  nitroGLYCERIN (NITROSTAT) 0.4 MG SL tablet Place 1 tablet (0.4 mg total) under the tongue every 5 (five) minutes as needed for chest pain. 06/18/21   Nahser, Deloris Ping, MD  oxyCODONE 10 MG TABS Take 1 tablet (10 mg total) by mouth 3 (three) times daily. Patient not taking: Reported on 04/12/2022 07/12/19   Lars Mage, PA-C  pantoprazole (PROTONIX) 40 MG tablet Take 1 tablet (40 mg total) by mouth  daily. 01/12/22   Nahser, Deloris Ping, MD  pramipexole  (MIRAPEX) 0.25 MG tablet Take 0.25 mg by mouth at bedtime.  02/18/19   [provider]  promethazine (PHENERGAN) 12.5 MG tablet TAKE ONE TABLET BY MOUTH EVERY 6 HOURS AS NEEDED FOR NAUSEA AND VOMITING 11/06/19   Maeola Harman, MD  ranolazine (RANEXA) 1000 MG SR tablet Take 1 tablet (1,000 mg total) by mouth 2 (two) times daily. 01/12/22   Nahser, Deloris Ping, MD  sacubitril-valsartan (ENTRESTO) 49-51 MG Take 1 tablet by mouth 2 (two) times daily. 01/11/22   Nahser, Deloris Ping, MD  sertraline (ZOLOFT) 100 MG tablet Take 150 mg by mouth at bedtime.     [provider]  spironolactone (ALDACTONE) 25 MG tablet Take 1 tablet (25 mg total) by mouth daily. 01/12/22   Nahser, Deloris Ping, MD  Vitamin D, Ergocalciferol, (DRISDOL) 1.25 MG (50000 UNIT) CAPS capsule Take 50,000 Units by mouth every 7 (seven) days.    [provider]    Inpatient Medications: Scheduled Meds:  ferrous sulfate  325 mg Oral BID WC   Continuous Infusions:  PRN Meds: acetaminophen **OR** acetaminophen  Allergies:    Allergies  Allergen Reactions   Benadryl [Diphenhydramine] Shortness Of Breath   Penicillins Diarrhea    Did it involve swelling of the face/tongue/throat, SOB, or low BP? No Did it involve sudden or severe rash/hives, skin peeling, or any reaction on the inside of your mouth or nose? No Did you need to seek medical attention at a hospital or doctor's office? No When did it last happen?      2 months If all above answers are "NO", may proceed with cephalosporin use.   Adhesive [Tape] Other (See Comments)    Burning of skin   Amoxicillin Diarrhea and Other (See Comments)    Has patient had a PCN reaction causing immediate rash, facial/tongue/throat swelling, SOB or lightheadedness with hypotension: No Has patient had a PCN reaction causing severe rash involving mucus membranes or skin necrosis: No Has patient had a PCN reaction that required hospitalization No Has patient had a  PCN reaction occurring within the last 10 years: Yes -- noted rxn as diarrhea If all of the above answers are "NO", then may proceed with Cephalosporin use.    Hydrocodone-Acetaminophen Itching and Nausea And Vomiting   Cyclobenzaprine Other (See Comments)    Causes restless leg syndrome Restless syndrome   Hydrocodone Itching    Social History:   Social History   Socioeconomic History   Marital status: Divorced    Spouse name: Not on file   Number of children: 5   Years of education: Not on file   Highest education level: Not on file  Occupational History   Occupation: Disabled  Tobacco Use   Smoking status: Former    Packs/day: 0.50    Years: 34.00    Additional pack years: 0.00    Total pack years: 17.00    Types: Cigarettes    Quit date: 07/20/1998    Years since quitting: 24.0   Smokeless tobacco: Never  Vaping Use   Vaping Use: Never used  Substance and Sexual Activity   Alcohol use: Not Currently   Drug use: Yes    Types: Marijuana    Comment: [employer]ables   Sexual activity: Not Currently  Other Topics Concern   Not on file  Social History Narrative   Lives with best friend.    Social Determinants of Health   Financial Resource Strain:  Not on file  Food Insecurity: Not on file  Transportation Needs: Not on file  Physical Activity: Not on file  Stress: Not on file  Social Connections: Not on file  Intimate Partner Violence: Not on file    Family History:   Family History  Problem Relation Age of Onset   Lung cancer Mother    Clotting disorder Father    Lung cancer Sister    Heart disease Brother    Heart disease Brother    Colon cancer Neg Hx    Breast cancer Neg Hx      ROS:  Please see the history of present illness.  Review of Systems  Constitutional:  Negative for fever.  HENT:  Negative for congestion.   Respiratory:  Positive for cough, sputum production and shortness of breath.   Cardiovascular:  Negative for chest pain, orthopnea, leg  swelling and PND.  Gastrointestinal:  Negative for blood in stool, melena, nausea and vomiting.  Genitourinary:  Negative for hematuria.  Musculoskeletal:  Positive for back pain (chronic).  Neurological:  Negative for dizziness and loss of consciousness.  Endo/Heme/Allergies:  Does not bruise/bleed easily.  Psychiatric/Behavioral:  Positive for substance abuse.     Physical Exam/Data:   Vitals:   07/24/22 0702 07/24/22 0800 07/24/22 1055 07/24/22 1100  BP: (!) 97/31 (!) 94/52  (!) 112/41  Pulse: (!) 40 (!) 42  (!) 40  Resp: 16 12  18   Temp: 97.8 F (36.6 C)  98.5 F (36.9 C)   TempSrc:   Oral   SpO2: 100% 100%  100%  Weight:      Height:        Intake/Output Summary (Last 24 hours) at 07/24/2022 1216 Last data filed at 07/24/2022 8469 Gross per 24 hour  Intake 1241.84 ml  Output --  Net 1241.84 ml      07/24/2022    1:30 AM 07/24/2022    1:17 AM 04/12/2022    3:35 PM  Last 3 Weights  Weight (lbs) 116 lb 13.5 oz 116 lb 13.5 oz 118 lb  Weight (kg) 53 kg 53 kg 53.524 kg     Body mass index is 22.08 kg/m.  General: 72 y.o. thin female resting comfortably in no acute distress. HEENT: Normocephalic and atraumatic. Sclera clear.  Neck: Supple. No carotid bruits. No JVD. Heart: Bradycardic with normal rhythm. Distinct S1 and S2. No murmurs, gallops, or rubs. Radial pulses 2+ and equal bilaterally. Lungs: No increased work of breathing. Clear to ausculation bilaterally. No wheezes, rhonchi, or rales.  Abdomen: Soft, non-distended, and non-tender to palpation. Extremities: No lower extremity edema.    Skin: Warm and dry. Decreased skin turgor. Neuro: Alert and oriented x3. No focal deficits. Psych: Normal affect. Responds appropriately.   EKG:  The EKG was personally reviewed and demonstrates:  Sinus bradycardia, rate 47 bpm, with no acute ST/T/ changes.  Telemetry:  Telemetry was personally reviewed and demonstrates:  Sinus bradycardia with PACs and possible intermittent  episode of Wenckebach (prolonging PR and then dropped beat). Rates in the low 40s.  Relevant CV Studies:  Left Cardiac Catheterization 06/27/2019: 1. Severe multivessel CAD with total occlusion of the RCA, first diagonal, and first OM branches 2. 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 Moderate calcific proximal LAD stenosis unchanged from prior cath study 4. Distal RCA and LCx territories collateralized by branches of the LAD   Stable coronary anatomy - continue medical therapy/supportive measures  Diagnostic Dominance: Right  _______________  Echocardiogram 02/03/2022: Impressions: 1. Left ventricular ejection fraction, by estimation, is 55 to 60%. The  left ventricle has normal function. The left ventricle has no regional  wall motion abnormalities. Left ventricular diastolic parameters are  indeterminate.   2. Right ventricular systolic function is low normal. The right  ventricular size is normal. Tricuspid regurgitation signal is inadequate  for assessing PA pressure.   3. The mitral valve is normal in structure. Trivial mitral valve  regurgitation. No evidence of mitral stenosis.   4. The aortic valve is tricuspid. Aortic valve regurgitation is not  visualized. Aortic valve sclerosis/calcification is present, without any  evidence of aortic stenosis.   5. The inferior vena cava is normal in size with greater than 50%  respiratory variability, suggesting right atrial pressure of 3 mmHg.    Laboratory Data:  High Sensitivity Troponin:   Recent Labs  Lab 07/24/22 0114  TROPONINIHS 12     Chemistry Recent Labs  Lab 07/24/22 0114  NA 137  K 6.0*  CL 111  CO2 20*  GLUCOSE 98  BUN 40*  CREATININE 1.15*  CALCIUM 8.5*  GFRNONAA 51*  ANIONGAP 6    Recent Labs  Lab 07/24/22 0114  PROT 5.9*  ALBUMIN 2.8*  AST 22  ALT 20  ALKPHOS 50  BILITOT 0.5   Lipids No results for input(s): "CHOL", "TRIG", "HDL", "LABVLDL", "LDLCALC", "CHOLHDL" in the  last 168 hours.  Hematology Recent Labs  Lab 07/24/22 0114  WBC 7.6  RBC 3.90  HGB 11.8*  HCT 37.9  MCV 97.2  MCH 30.3  MCHC 31.1  RDW 13.7  PLT 161   Thyroid  Recent Labs  Lab 07/24/22 0736  TSH 0.802    BNPNo results for input(s): "BNP", "PROBNP" in the last 168 hours.  DDimer No results for input(s): "DDIMER" in the last 168 hours.   Radiology/Studies:  CT HEAD WO CONTRAST ( )  Result Date: 07/24/2022 CLINICAL DATA:  Trauma. EXAM: CT HEAD WITHOUT CONTRAST CT CERVICAL SPINE WITHOUT CONTRAST TECHNIQUE: Multidetector CT imaging of the head and cervical spine was performed following the standard protocol without intravenous contrast. Multiplanar CT image reconstructions of the cervical spine were also generated. RADIATION DOSE REDUCTION: This exam was performed according to the departmental dose-optimization program which includes automated exposure control, adjustment of the mA and/or kV according to patient size and/or use of iterative reconstruction technique. COMPARISON:  Head CT dated 09/19/2019. FINDINGS: CT HEAD FINDINGS Brain: Mild age-related atrophy and chronic microvascular ischemic changes. There is no acute intracranial hemorrhage. No mass effect or midline shift. No extra-axial fluid collection. Vascular: No hyperdense vessel or unexpected calcification. Skull: Normal. Negative for fracture or focal lesion. Sinuses/Orbits: There is mucoperiosteal thickening of paranasal sinuses. No air-fluid level. Mild left mastoid effusions. Other: None CT CERVICAL SPINE FINDINGS Alignment: No acute subluxation. There is straightening of normal cervical lordosis which may be positional or due to muscle spasm. Skull base and vertebrae: No acute fracture.  Osteopenia. Soft tissues and spinal canal: No prevertebral fluid or swelling. No visible canal hematoma. Disc levels: No acute findings. Degenerative changes. C5-C6 ankylosis. Upper chest: Negative. Other: Right thyroid nodule measures 13  mm. This has been evaluated on previous imaging. (ref: J Am Coll Radiol. 2015 Feb;12(2): 143-50). IMPRESSION: 1. No acute intracranial pathology. Mild age-related atrophy and chronic microvascular ischemic changes. 2. No acute/traumatic cervical spine pathology. Electronically Signed   By: Elgie Collard M.D.   On: 07/24/2022 03:35   CT CERVICAL SPINE WO  CONTRAST  Result Date: 07/24/2022 CLINICAL DATA:  Trauma. EXAM: CT HEAD WITHOUT CONTRAST CT CERVICAL SPINE WITHOUT CONTRAST TECHNIQUE: Multidetector CT imaging of the head and cervical spine was performed following the standard protocol without intravenous contrast. Multiplanar CT image reconstructions of the cervical spine were also generated. RADIATION DOSE REDUCTION: This exam was performed according to the departmental dose-optimization program which includes automated exposure control, adjustment of the mA and/or kV according to patient size and/or use of iterative reconstruction technique. COMPARISON:  Head CT dated 09/19/2019. FINDINGS: CT HEAD FINDINGS Brain: Mild age-related atrophy and chronic microvascular ischemic changes. There is no acute intracranial hemorrhage. No mass effect or midline shift. No extra-axial fluid collection. Vascular: No hyperdense vessel or unexpected calcification. Skull: Normal. Negative for fracture or focal lesion. Sinuses/Orbits: There is mucoperiosteal thickening of paranasal sinuses. No air-fluid level. Mild left mastoid effusions. Other: None CT CERVICAL SPINE FINDINGS Alignment: No acute subluxation. There is straightening of normal cervical lordosis which may be positional or due to muscle spasm. Skull base and vertebrae: No acute fracture.  Osteopenia. Soft tissues and spinal canal: No prevertebral fluid or swelling. No visible canal hematoma. Disc levels: No acute findings. Degenerative changes. C5-C6 ankylosis. Upper chest: Negative. Other: Right thyroid nodule measures 13 mm. This has been evaluated on previous  imaging. (ref: J Am Coll Radiol. 2015 Feb;12(2): 143-50). IMPRESSION: 1. No acute intracranial pathology. Mild age-related atrophy and chronic microvascular ischemic changes. 2. No acute/traumatic cervical spine pathology. Electronically Signed   By: Elgie Collard M.D.   On: 07/24/2022 03:35   DG Chest 2 View  Result Date: 07/24/2022 CLINICAL DATA:  Cough EXAM: CHEST - 2 VIEW COMPARISON:  02/21/2022 FINDINGS: Lungs are clear. No pneumothorax or pleural effusion. Coronary artery bypass grafting has been performed. Cardiac size within normal limits. Pulmonary vascularity is normal. No acute bone abnormality. IMPRESSION: 1. No active cardiopulmonary disease. Electronically Signed   By: Helyn Numbers M.D.   On: 07/24/2022 01:56     Assessment and Plan:   Bradycardia Patient presented for further evaluation of a "jerking" episode which she has a history of and was noted to be bradycardic. EKG shows sinus bradycardia with some irregularity and rate of 47 bpm. EKG shows sinus bradycardia with PAC and possible intermittent Wenckebach (prolonging PR and then dropped beat). Potassium 6.0 on admission. TSH normal and Magnesium pending.  - Currently in sinus bradycardia with rates in the low 40s.  - She was initially hypotensive on arrival but this has improved with IV fluids. I don't think her bradycardia was the cause of her hypotension.  - Will stop Amiodarone. - Will recheck potassium to make sure potassium has improved.  - Continue to monitor on telemetry.  - No need for pacemaker at this time.   Hypotension Patient hypotensive on arrival with BP as low as 74/42. This may of been the cause of her "jerking" episode. Improved with fluids. Most recent BP 112/41.  - Will hold home cardiac medications that lower BP (Entresto, Spironolactone, Imdur, and Ranexa). - I don't think her bradycardia caused her hypotension. She reports normal PO intake and denies any vomiting or diarrhea but looks dry on exam  with decreased skin turgor. Continue to hold medications as above. Fluids per primary team. Can reintroduce cardiac meds as BP allows.  CAD s/p CABG and Subsequent PCI Patient has a long history of CAD. S/p CABG x3 in 1998 in setting of NSTEMI with subsequent PCI in 2013 and then again in 2016. She  now has occlusion of all three grafts. Last LHC in 06/2019 showed stable 3 vessel CAD with know occlusions of all grafts. Continued medical therapy was recommended. - She reports 2 episode of neck pain which she describes as an "anginal attack" since her jerking episode last night. Otherwise, no anginal symptoms.  EKG shows no acute ischemic changes and high sensitivity troponin negative. Suspect this may of been due to significant hypotension.  - Continue aspirin and statin/ Zetia.  Chronic Combined CHF LVEF as low as 20-25% in 06/2019 after PEA. However, EF has since normalized. Last Echo in 01/2022 showed LVEF of 55-60% with no regional wall motion abnormalities, low normal RV function, and no significant valvular disease. - Looks a little dry on exam with decreased skin turgor.  - Hold home Entresto, Spironolactone, and Imdur given hypotension. Can gradually restart as BP allows. - Continue to monitor volume status closely.   Paroxysmal Atrial Fibrillation Maintaining sinus rhythm. Currently bradycardic with rates in the low 40s.  - Will stop Amiodarone given bradycardia. Not on any other AV nodal agents.  - Continue chronic anticoagulation with Eliquis 5mg  twice daily.  Hyperkalemia Potassium 6.0 on arrival.  - Will recheck. If still elevated, will give Lokelma.  Otherwise, per primary team: - PAD - Hyperlipidemia - COPD - Chronic back pain - History of polysubstance abuse  Risk Assessment/Risk Scores:    New York Heart Association (NYHA) Functional Class NYHA Class II-III   CHA2DS2-VASc Score = 7  This indicates a 11.2% annual risk of stroke. The patient's score is based  upon: CHF History: 1 HTN History: 1 Diabetes History: 0 Stroke History: 2 (No stroke but history of DVT) Vascular Disease History: 1 Age Score: 1 Gender Score: 1   For questions or updates, please contact [facility] Please consult [email or web address] for contact info under    Signed, Corrin Parker, PA-C  07/24/2022 12:16 PM

## 2022-07-24 NOTE — Assessment & Plan Note (Addendum)
NSTEMI 1998 s/p CABG // S/p PCI in 2013 // S/p DES to S-RCA in 01/2015 All grafts occluded - Cath in 05/2019 Troponin wnl  Continue medical management, but hold imdur with hypotension.  NTG prn chest pain, Ranexa 1000 mg twice daily, ASA 81 mg once daily, Lipitor 80 mg once daily.

## 2022-07-24 NOTE — Assessment & Plan Note (Signed)
Continue protonix  

## 2022-07-24 NOTE — Assessment & Plan Note (Signed)
EF was severely reduced at the time of her MI and PEA arrest after her LE bypass surgery in 2021. This improved to 45-50 in July 2021 and was normal on the echocardiogram done in Nov 2023  Echo 11/23: normal EF. Diastolic indeterminate Hold entresto and spironolactone  Daily weights

## 2022-07-24 NOTE — ED Notes (Signed)
EDP at bedside  

## 2022-07-24 NOTE — ED Notes (Signed)
Pt refuses to keep anti-slip hospital socks on.

## 2022-07-24 NOTE — Assessment & Plan Note (Signed)
S/p L Fem-Fem bypass // S/p Ao-Bifem bypass 06/2019  Continue ASA and high dose statin

## 2022-07-25 ENCOUNTER — Inpatient Hospital Stay (HOSPITAL_COMMUNITY): Payer: Medicare HMO

## 2022-07-25 DIAGNOSIS — I480 Paroxysmal atrial fibrillation: Secondary | ICD-10-CM

## 2022-07-25 DIAGNOSIS — J449 Chronic obstructive pulmonary disease, unspecified: Secondary | ICD-10-CM

## 2022-07-25 DIAGNOSIS — R4182 Altered mental status, unspecified: Secondary | ICD-10-CM | POA: Diagnosis not present

## 2022-07-25 DIAGNOSIS — I25119 Atherosclerotic heart disease of native coronary artery with unspecified angina pectoris: Secondary | ICD-10-CM

## 2022-07-25 DIAGNOSIS — I5032 Chronic diastolic (congestive) heart failure: Secondary | ICD-10-CM

## 2022-07-25 DIAGNOSIS — R569 Unspecified convulsions: Secondary | ICD-10-CM

## 2022-07-25 DIAGNOSIS — R4689 Other symptoms and signs involving appearance and behavior: Secondary | ICD-10-CM

## 2022-07-25 DIAGNOSIS — R259 Unspecified abnormal involuntary movements: Secondary | ICD-10-CM | POA: Diagnosis not present

## 2022-07-25 LAB — BASIC METABOLIC PANEL
Anion gap: 6 (ref 5–15)
BUN: 29 mg/dL — ABNORMAL HIGH (ref 8–23)
CO2: 22 mmol/L (ref 22–32)
Calcium: 8.6 mg/dL — ABNORMAL LOW (ref 8.9–10.3)
Chloride: 106 mmol/L (ref 98–111)
Creatinine, Ser: 1.27 mg/dL — ABNORMAL HIGH (ref 0.44–1.00)
GFR, Estimated: 45 mL/min — ABNORMAL LOW (ref >60)
Glucose, Bld: 92 mg/dL (ref 70–99)
Potassium: 5.5 mmol/L — ABNORMAL HIGH (ref 3.5–5.1)
Sodium: 134 mmol/L — ABNORMAL LOW (ref 135–145)

## 2022-07-25 LAB — CORTISOL-AM, BLOOD: Cortisol - AM: 6.1 ug/dL — ABNORMAL LOW (ref 6.7–22.6)

## 2022-07-25 MED ORDER — SODIUM ZIRCONIUM CYCLOSILICATE 10 G PO PACK
10.0000 g | PACK | Freq: Once | ORAL | Status: DC
  Filled 2022-07-25: qty 1

## 2022-07-25 MED ORDER — SACUBITRIL-VALSARTAN 24-26 MG PO TABS: 1.0000 | ORAL_TABLET | Freq: Two times a day (BID) | ORAL | Status: DC

## 2022-07-25 MED ORDER — COSYNTROPIN 0.25 MG IJ SOLR
0.2500 mg | Freq: Once | INTRAMUSCULAR | Status: AC
  Administered 2022-07-26: 0.25 mg via INTRAVENOUS
  Filled 2022-07-25: qty 0.25

## 2022-07-25 MED ORDER — SODIUM CHLORIDE 0.9 % IV SOLN: INTRAVENOUS | Status: AC

## 2022-07-25 MED ORDER — GADOBUTROL 1 MMOL/ML IV SOLN
5.0000 mL | Freq: Once | INTRAVENOUS | Status: AC | PRN
  Administered 2022-07-25: 5 mL via INTRAVENOUS

## 2022-07-25 NOTE — Procedures (Signed)
Patient Name: Wendy [name]  MRN: [id number]  Epilepsy Attending: Charlsie Quest  Referring Physician/Provider: Alwyn Ren, MD  Date: 07/25/2022 Duration: 24.11 mins  Patient history: 72 yo woman presents after spell of involuntary movements. EEG to evaluate for seizure  Level of alertness: Awake, asleep  AEDs during EEG study: None  Technical aspects: This EEG study was done with scalp electrodes positioned according to the 10-20 International system of electrode placement. Electrical activity was reviewed with band pass filter of 1-70Hz , sensitivity of 7 uV/mm, display speed of 44mm/sec with a 60Hz  notched filter applied as appropriate. EEG data were recorded continuously and digitally stored.  Video monitoring was available and reviewed as appropriate.  Description: The posterior dominant rhythm consists of 8Hz  activity of moderate voltage (25-35 uV) seen predominantly in posterior head regions, symmetric and reactive to eye opening and eye closing. Sleep was characterized by vertex waves, sleep spindles (12 to 14 Hz), maximal frontocentral region.  Hyperventilation and photic stimulation were not performed.     IMPRESSION: This study is within normal limits. No seizures or epileptiform discharges were seen throughout the recording.  A normal interictal EEG does not exclude  the diagnosis of epilepsy.   [name]

## 2022-07-25 NOTE — Evaluation (Signed)
Physical Therapy Evaluation Patient Details Name: Wendy [name] MRN: [id number] DOB: [birth year] Today's Date: 07/25/2022  History of Present Illness  Pt is a 72 y/o F admitted on 07/24/22 after presenting to the ED with c/o "shaky spell". Pt notes she has had these intermittent for the past 4 years & if they occur while she's sleeping they wake her up. Pt noted to be hypotensive & bradycardic. Neurology has been consulted. PMH: COPD, OSA, CAD, PAD, PAF on eliquis, HFpEF, anxiety, depression, substance abuse, MI, peripheral neuropathy  Clinical Impression  Pt seen for PT evaluation with pt received in bathroom, pt agreeable. Pt reports prior to admission she was ambulatory without AD, living with nephew in 1 level home with ramped entrance, but notes several falls over the past 6 months. On this date, pt is able to ambulate without AD with supervision fade to mod I without overt LOB. Due to pt reporting hx of falls, will continue to follow pt acutely but do not anticipate pt will require f/u PT services.   Recommendations for follow up therapy are one component of a multi-disciplinary discharge planning process, led by the attending physician.  Recommendations may be updated based on patient status, additional functional criteria and insurance authorization.  Follow Up Recommendations       Assistance Recommended at Discharge PRN  Patient can return home with the following  Assistance with cooking/housework    Equipment Recommendations None recommended by PT  Recommendations for Other Services       Functional Status Assessment Patient has had a recent decline in their functional status and demonstrates the ability to make significant improvements in function in a reasonable and predictable amount of time.     Precautions / Restrictions Precautions Precautions: Fall Restrictions Weight Bearing Restrictions: No      Mobility  Bed Mobility               General bed mobility  comments: not tested, pt received in bathroom, left sitting EOB    Transfers Overall transfer level: Modified independent                 General transfer comment: STS from toilet, EOB    Ambulation/Gait Ambulation/Gait assistance: Supervision, Modified independent (Device/Increase time) Gait Distance (Feet): 100 Feet Assistive device: None   Gait velocity: decreased        Stairs            Wheelchair Mobility    Modified Rankin (Stroke Patients Only)       Balance Overall balance assessment: History of Falls   Sitting balance-Leahy Scale: Normal     Standing balance support: During functional activity, No upper extremity supported Standing balance-Leahy Scale: Good                               Pertinent Vitals/Pain Pain Assessment Pain Assessment: Faces Faces Pain Scale: Hurts little more Pain Location: chronic back pain Pain Descriptors / Indicators: Discomfort Pain Intervention(s): Monitored during session, Limited activity within patient's tolerance, Repositioned, RN gave pain meds during session    Home Living Family/patient expects to be discharged to:: Private residence Living Arrangements: Other relatives (nephew) Available Help at Discharge: Family;Available PRN/intermittently Type of Home: House Home Access: Ramped entrance       Home Layout: One level        Prior Function Prior Level of Function : Independent/Modified Independent;History of Falls (last six months)  Mobility Comments: Pt reports she's ambulatory without AD, notes several falls over the past 6 months but unable to put a number on how many.       Hand Dominance        Extremity/Trunk Assessment   Upper Extremity Assessment Upper Extremity Assessment: Overall WFL for tasks assessed    Lower Extremity Assessment Lower Extremity Assessment: Generalized weakness;Overall WFL for tasks assessed       Communication    Communication: No difficulties  Cognition Arousal/Alertness: Awake/alert Behavior During Therapy: WFL for tasks assessed/performed Overall Cognitive Status: Within Functional Limits for tasks assessed                                          General Comments      Exercises Other Exercises Other Exercises: Pt performed 5x STS from EOB without BUE support with independence; repetitions limited by back pain with task.   Assessment/Plan    PT Assessment Patient needs continued PT services  PT Problem List Decreased balance;Decreased activity tolerance;Decreased strength       PT Treatment Interventions DME instruction;Therapeutic exercise;Gait training;Balance training;Neuromuscular re-education;Stair training;Functional mobility training;Therapeutic activities;Patient/family education    PT Goals (Current goals can be found in the Care Plan section)  Acute Rehab PT Goals Patient Stated Goal: none stated PT Goal Formulation: With patient Time For Goal Achievement: 08/08/22 Potential to Achieve Goals: Good Additional Goals Additional Goal #1: Pt will score >19/21 on DGI to demonstrate improved balance & reduced fall risk.    Frequency Min 2X/week     Co-evaluation               AM-PAC PT "6 Clicks" Mobility  Outcome Measure Help needed turning from your back to your side while in a flat bed without using bedrails?: None Help needed moving from lying on your back to sitting on the side of a flat bed without using bedrails?: None Help needed moving to and from a bed to a chair (including a wheelchair)?: None Help needed standing up from a chair using your arms (e.g., wheelchair or bedside chair)?: None Help needed to walk in hospital room?: A Little Help needed climbing 3-5 steps with a railing? : A Little 6 Click Score: 22    End of Session   Activity Tolerance: Patient tolerated treatment well (limited by transportation services arriving to take pt  to MRI) Patient left:  (sitting EOB in care of transport tech) Nurse Communication: Mobility status PT Visit Diagnosis: History of falling (Z91.81);Muscle weakness (generalized) (M62.81)    Time: 1610-9604 PT Time Calculation (min) (ACUTE ONLY): 13 min   Charges:   PT Evaluation $PT Eval Low Complexity: 1 Low          Aleda Grana, PT, DPT 07/25/22, 3:40 PM   Sandi Mariscal 07/25/2022, 3:39 PM

## 2022-07-25 NOTE — Progress Notes (Addendum)
Rounding Note    Patient Name: Wendy [name] Date of Encounter: 07/25/2022  [facility] Cardiologist: Kristeen Miss, MD   Subjective   Patient states she is feeling well, no further whole-body jerking motion reoccurred since she was admitted, denies any dizziness, shortness of breath, chest pain. She has never seen a neurologist. Myrtice Lauth movement has been intermittently occurring over the last 4 years.   Inpatient Medications    Scheduled Meds:  apixaban  5 mg Oral BID   atorvastatin  80 mg Oral Daily   buPROPion  150 mg Oral Daily   ezetimibe  10 mg Oral Daily   hydrocortisone  5 mg Oral BID   isosorbide mononitrate  30 mg Oral Daily   levothyroxine  50 mcg Oral QAC breakfast   pantoprazole  40 mg Oral Daily   pramipexole  0.25 mg Oral QHS   ranolazine  1,000 mg Oral BID   sertraline  150 mg Oral QHS   Continuous Infusions:  PRN Meds: acetaminophen **OR** acetaminophen, albuterol, ALPRAZolam, oxyCODONE   Vital Signs    Vitals:   07/24/22 2116 07/25/22 0003 07/25/22 0415 07/25/22 0801  BP: (!) 94/52 (!) 134/54 (!) 125/54 (!) 108/53  Pulse: (!) 57   (!) 56  Resp:  13 15 16   Temp:   98.2 F (36.8 C) 98.4 F (36.9 C)  TempSrc:   Oral Oral  SpO2: 97% 97% 98% 99%  Weight:   57.3 kg   Height:       No intake or output data in the 24 hours ending 07/25/22 0856     07/25/2022    4:15 AM 07/24/2022    6:12 PM 07/24/2022    1:30 AM  Last 3 Weights  Weight (lbs) 126 lb 6.4 oz 128 lb 1.4 oz 116 lb 13.5 oz  Weight (kg) 57.335 kg 58.1 kg 53 kg      Telemetry    Sinus bradycardia 50s-60s, occasional PVCs and PACs, no high-grade AV block noted, maybe rare non-conducted P- Personally Reviewed  ECG    N/A today - Personally Reviewed  Physical Exam   GEN: No acute distress.   Neck: No JVD Cardiac: RRR, occasional early heart beat, no murmurs, rubs, or gallops.  Respiratory: Clear to auscultation bilaterally.on room air  GI: Soft, nontender,  non-distended  MS: No leg edema; No deformity. Neuro:  Nonfocal  Psych: Normal affect   Labs    High Sensitivity Troponin:   Recent Labs  Lab 07/24/22 0114  TROPONINIHS 12     Chemistry Recent Labs  Lab 07/24/22 0114 07/24/22 0736 07/24/22 1324 07/25/22 0346  NA 137  --  138 134*  K 6.0*  --  5.2* 5.5*  CL 111  --  112* 106  CO2 20*  --  21* 22  GLUCOSE 98  --  111* 92  BUN 40*  --  30* 29*  CREATININE 1.15*  --  1.01* 1.27*  CALCIUM 8.5*  --  7.9* 8.6*  MG  --  2.0  --   --   PROT 5.9*  --   --   --   ALBUMIN 2.8*  --   --   --   AST 22  --   --   --   ALT 20  --   --   --   ALKPHOS 50  --   --   --   BILITOT 0.5  --   --   --   [facility]  51*  --  60* 45*  ANIONGAP 6  --  5 6    Lipids No results for input(s): "CHOL", "TRIG", "HDL", "LABVLDL", "LDLCALC", "CHOLHDL" in the last 168 hours.  Hematology Recent Labs  Lab 07/24/22 0114  WBC 7.6  RBC 3.90  HGB 11.8*  HCT 37.9  MCV 97.2  MCH 30.3  MCHC 31.1  RDW 13.7  PLT 161   Thyroid  Recent Labs  Lab 07/24/22 0736  TSH 0.802    BNPNo results for input(s): "BNP", "PROBNP" in the last 168 hours.  DDimer No results for input(s): "DDIMER" in the last 168 hours.   Radiology    CT HEAD WO CONTRAST ( )  Result Date: 07/24/2022 CLINICAL DATA:  Trauma. EXAM: CT HEAD WITHOUT CONTRAST CT CERVICAL SPINE WITHOUT CONTRAST TECHNIQUE: Multidetector CT imaging of the head and cervical spine was performed following the standard protocol without intravenous contrast. Multiplanar CT image reconstructions of the cervical spine were also generated. RADIATION DOSE REDUCTION: This exam was performed according to the departmental dose-optimization program which includes automated exposure control, adjustment of the mA and/or kV according to patient size and/or use of iterative reconstruction technique. COMPARISON:  Head CT dated 09/19/2019. FINDINGS: CT HEAD FINDINGS Brain: Mild age-related atrophy and chronic microvascular  ischemic changes. There is no acute intracranial hemorrhage. No mass effect or midline shift. No extra-axial fluid collection. Vascular: No hyperdense vessel or unexpected calcification. Skull: Normal. Negative for fracture or focal lesion. Sinuses/Orbits: There is mucoperiosteal thickening of paranasal sinuses. No air-fluid level. Mild left mastoid effusions. Other: None CT CERVICAL SPINE FINDINGS Alignment: No acute subluxation. There is straightening of normal cervical lordosis which may be positional or due to muscle spasm. Skull base and vertebrae: No acute fracture.  Osteopenia. Soft tissues and spinal canal: No prevertebral fluid or swelling. No visible canal hematoma. Disc levels: No acute findings. Degenerative changes. C5-C6 ankylosis. Upper chest: Negative. Other: Right thyroid nodule measures 13 mm. This has been evaluated on previous imaging. (ref: J Am Coll Radiol. 2015 Feb;12(2): 143-50). IMPRESSION: 1. No acute intracranial pathology. Mild age-related atrophy and chronic microvascular ischemic changes. 2. No acute/traumatic cervical spine pathology. Electronically Signed   By: Elgie Collard M.D.   On: 07/24/2022 03:35   CT CERVICAL SPINE WO CONTRAST  Result Date: 07/24/2022 CLINICAL DATA:  Trauma. EXAM: CT HEAD WITHOUT CONTRAST CT CERVICAL SPINE WITHOUT CONTRAST TECHNIQUE: Multidetector CT imaging of the head and cervical spine was performed following the standard protocol without intravenous contrast. Multiplanar CT image reconstructions of the cervical spine were also generated. RADIATION DOSE REDUCTION: This exam was performed according to the departmental dose-optimization program which includes automated exposure control, adjustment of the mA and/or kV according to patient size and/or use of iterative reconstruction technique. COMPARISON:  Head CT dated 09/19/2019. FINDINGS: CT HEAD FINDINGS Brain: Mild age-related atrophy and chronic microvascular ischemic changes. There is no acute  intracranial hemorrhage. No mass effect or midline shift. No extra-axial fluid collection. Vascular: No hyperdense vessel or unexpected calcification. Skull: Normal. Negative for fracture or focal lesion. Sinuses/Orbits: There is mucoperiosteal thickening of paranasal sinuses. No air-fluid level. Mild left mastoid effusions. Other: None CT CERVICAL SPINE FINDINGS Alignment: No acute subluxation. There is straightening of normal cervical lordosis which may be positional or due to muscle spasm. Skull base and vertebrae: No acute fracture.  Osteopenia. Soft tissues and spinal canal: No prevertebral fluid or swelling. No visible canal hematoma. Disc levels: No acute findings. Degenerative changes. C5-C6 ankylosis. Upper chest: Negative. Other:  Right thyroid nodule measures 13 mm. This has been evaluated on previous imaging. (ref: J Am Coll Radiol. 2015 Feb;12(2): 143-50). IMPRESSION: 1. No acute intracranial pathology. Mild age-related atrophy and chronic microvascular ischemic changes. 2. No acute/traumatic cervical spine pathology. Electronically Signed   By: Elgie Collard M.D.   On: 07/24/2022 03:35   DG Chest 2 View  Result Date: 07/24/2022 CLINICAL DATA:  Cough EXAM: CHEST - 2 VIEW COMPARISON:  02/21/2022 FINDINGS: Lungs are clear. No pneumothorax or pleural effusion. Coronary artery bypass grafting has been performed. Cardiac size within normal limits. Pulmonary vascularity is normal. No acute bone abnormality. IMPRESSION: 1. No active cardiopulmonary disease. Electronically Signed   By: Helyn Numbers M.D.   On: 07/24/2022 01:56    Cardiac Studies   Left Cardiac Catheterization 06/27/2019: 1. Severe multivessel CAD with total occlusion of the RCA, first diagonal, and first OM branches 2. 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 Moderate calcific proximal LAD stenosis unchanged from prior cath study 4. Distal RCA and LCx territories collateralized by branches of the LAD   Stable  coronary anatomy - continue medical therapy/supportive measures   Diagnostic Dominance: Right    _______________   Echocardiogram 02/03/2022: Impressions: 1. Left ventricular ejection fraction, by estimation, is 55 to 60%. The  left ventricle has normal function. The left ventricle has no regional  wall motion abnormalities. Left ventricular diastolic parameters are  indeterminate.   2. Right ventricular systolic function is low normal. The right  ventricular size is normal. Tricuspid regurgitation signal is inadequate  for assessing PA pressure.   3. The mitral valve is normal in structure. Trivial mitral valve  regurgitation. No evidence of mitral stenosis.   4. The aortic valve is tricuspid. Aortic valve regurgitation is not  visualized. Aortic valve sclerosis/calcification is present, without any  evidence of aortic stenosis.   5. The inferior vena cava is normal in size with greater than 50%  respiratory variability, suggesting right atrial pressure of 3 mmHg.     Patient Profile     72 y.o. female  with a history of CAD with NSTEMI and 1998 s/p CABG x3  (LIMA-LAD, SVG-OM2, SVG-RCA) with subsequent PCI in 2013 and DES to SVG to RCA in 01/2015, chronic combined CHF with EF as low as 20-25% in 06/2019 but improved to 55-60% on last Echo in 01/2022, paroxysmal atrial fibrillation on amiodarone and Eliquis, PAD s/p prior left to right fem-fem bypass and more recently aortic bifemoral bypass and 06/2019, mild bilateral carotid stenosis, COPD, hypertension, hyperlipidemia, prior DVT, obstructive sleep apnea on CPAP, and prior polysubstance abuse. Cardiology is consulted since 07/24/22 for sinus bradycardia and second degree Mobitz 1 AVB.   Assessment & Plan    Sinus bradycardia Second-degree AV block, Mobitz 1 - presented with jerking episodes, no cardiac symptoms, found to have hypotension, mild dehydration, low AM cortisol, hyperkalemia >6, EKG and telemetry showed sinus bradycardia  with intermittent Mobitz 1 - Trop negative; TSH wnL; Echo 11/23 with LVEF 55-60% - Not on AVN blocking agents PTA, would avoid  - Hypotension resolved with IVF, suspect due to mild dehydration + multiple GDMT for CHF, not due to sinus bradycardia  - No indication for PPM at this time, may discharge home with 2 week Zio   Hypotension, resolved  - BP improved with IVF as well as holding GDMT and amiodarone, BP 94/52 -134/54 over the past 24 hours  - PTA Entresto 49/51, spironolactone 25mg ,  PRN Lasix has  been held, remains on Imdur 30mg ; will resume Entresto at 24/26 BID this afternoon if BP stable   - consider work up for adrenal insuffiencey given low AM cortisol 3.4   Systolic heart failure with recovered EF - LVEF as low as 20-25% in 06/2019 after PEA. However, EF has since normalized. Last Echo in 01/2022 showed LVEF of 55-60% with no regional wall motion abnormalities, low normal RV function, and no significant valvular disease.  - Clinically euvolemic today - GDMT: PTA Entresto 49/51, spironolactone 25mg ,  PRN Lasix has been held, remains on Imdur 30mg ; will resume Entresto at 24/26 BID this afternoon if BP stable , hold off add SGLT2I given rising renal index   CAD  - S/p CABG x3 in 1998 in setting of NSTEMI with subsequent PCI in 2013 and then again in 2016. Last LHC in 06/2019 showed stable 3 vessel CAD with know occlusions of all grafts. Continued medical therapy was recommended.  - No angina, EKG and Hs trop negative this admission - Medical therapy: not on ASA due to Eliquis use, continue PTA lipitor 80mg , zetia 10mg ,  Imdur 30mg , and ranexa 1000mg  BID   Paroxysmal Atrial Fibrillation  - PTA amiodarone held due to bradycardia and Mobitz 1 - Rate controlled - Continue PTA Eliquis   Hyperkalemia - remains elevated 5.5, consider Lokelma -Trend BMP  Jerky movement of whole body  -Consider dyskinesia from medication versus chorea workup, neuro referral  Dehydration  PAD Anxiety  with depression COPD OSA THC use  - per primary team     For questions or updates, please contact [facility] Please consult [email or web address] for contact info under        Signed, Cyndi Bender, NP  07/25/2022, 8:56 AM    Personally seen and examined. Agree with APP above with the following comments: Still notes fatigue and dyskinetic movement. BP has recovered. Rare facial tick on exam.  BP has greatly improved but has not completely recovered. Will trial low dose IMDUR and no other medical management; if causing hypotension will DC off GDMT therapy. Rest as above.  Riley Lam, MD FASE [facility] Cardiologist [facility]  [address] [place], [address] [place], [place] [address] [phone]  11:15 AM

## 2022-07-25 NOTE — Consult Note (Signed)
NEUROLOGY CONSULTATION NOTE   Date of service: Jul 25, 2022 Patient Name: Wendy [name] MRN:  [id number] DOB:  1950/09/12 Reason for consult: jerking spells Requesting physician: Dr. Blanchard Mane _ _ _   _ __   _ __ _ _  __ __   _ __   __ _  History of Present Illness   This is a 72 yo woman with hx anxiety, asthma, COPD, chronic combined systolic and diastolic heart failure, CAD, depression, hyperlipidemia, history of DVT x 3 as well as pAF on eliquis who presents after spell of involuntary movements.  She has had similar spells for the past 4 years and has not been worked up as an outpatient.  She came into the ED yesterday because her spell was more severe than usual.    She describes the spells as involuntary movements of all of her extremities that are asynchronous and not rhythmic.  These occur randomly and nothing seems to make them worse or better or trigger them.  They may happen more than once per month but do not happen every month.  They typically last for about an hour. She does not think that they have been progressing over the past 4 years although her episode the other day was more violent than typically.  They can happen when she is awake or sleep and if she is asleep they do wake her from sleep.  She does not recall any medication changes or other precipitating factors before the started 4 years ago.  She does have restless leg syndrome and is on pramipexole for that but her RLS symptoms are reasonably controlled and do not wake her from sleep.  She feels that they are unrelated.  Her episode yesterday that resolved without intervention and she has never been on medication for these.  She has not had any further spells since admission.  She does not have altered mental status during the spells.  She reports no history of staring spells or other events concerning for possible seizure.    CT head in ED personally reviewed and showed no acute intracranial process.  U tox  positive for opiates, benzodiazepines, and THC.  She is prescribed oxycodone and alprazolam as an outpatient.   ROS   Per HPI: all other systems reviewed and are negative  Past History   I have reviewed the following:  Past Medical History:  Diagnosis Date   Anemia    Anginal pain (HCC)    jaw pain 07/2016, s/p 07/15/16 nuclear stress test   Anxiety    Arthritis    "lower back" (08/24/2017)   Asthma    Chornic Obstructive Pulmonary Disease    Chronic back pain    Chronic combined systolic and diastolic CHF    EF 5/18: 60-65 // EF 4/21: 55-60 // EF 4/21: 20-25   Chronic leg pain    Chronic lower back pain    Coronary Artery Disease    a. s/p NSTEMI >> CABG 1998 // s/p PCI 2013 // LHC 01/2015: DES to SVG-RCA.// Cath 05/2019: all grafts occluded; mod non-obs LAD disease w L-L and L-R collats - med Rx // post Ao-Bifem bypass MI c/b PEA, CGS requiring Impella >> anatomy unchanged - Med Rx   Depression    Fall 08/2016   Gastroesophageal reflux disease    Glucose intolerance    Headache    "a few/month" (08/24/2017)   HLD (hyperlipidemia)    Hx of DVT X 3   RLE  Hypertension    Myocardial infarction (HCC) 1999   OSA on CPAP    Paroxysmal atrial fibrillation    a. 09/2013 post-op b. 01/2015 // Apixaban // recurrent AFib post MI after Ao-Bifem in 06/2019 >> Amio Rx   Peripheral artery disease    s/p L fem-fem bypass // s/p Ao-Bifem bypass in 06/2019   Peripheral neuropathy    Pneumonia    Shortness of breath    when walking   Wears glasses    Past Surgical History:  Procedure Laterality Date   ABDOMINAL AORTOGRAM W/LOWER EXTREMITY Bilateral 04/29/2019   Procedure: ABDOMINAL AORTOGRAM W/LOWER EXTREMITY;  Surgeon: Maeola Harman, MD;  Location: [facility] INVASIVE CV LAB;  Service: Cardiovascular;  Laterality: Bilateral;   ANTERIOR CERVICAL DECOMP/DISCECTOMY FUSION  1991   AORTA - BILATERAL FEMORAL ARTERY BYPASS GRAFT N/A 06/18/2019   Procedure: AORTA BIFEMORAL BYPASS GRAFT;   Surgeon: Maeola Harman, MD;  Location: [facility] OR;  Service: Vascular;  Laterality: N/A;   ARTERY REPAIR Left 06/30/2019   Procedure: LEFT BRACHIAL ARTERY REPAIR, EVACUATION OF HEMATOMA;  Surgeon: Nada Libman, MD;  Location: [facility] OR;  Service: Vascular;  Laterality: Left;   BACK SURGERY     CARDIAC CATHETERIZATION N/A 01/22/2015   Procedure: Left Heart Cath and Coronary Angiography;  Surgeon: Lennette Bihari, MD;  Location: [facility] INVASIVE CV LAB;  Service: Cardiovascular;  Laterality: N/A;   CARDIAC CATHETERIZATION N/A 01/22/2015   Procedure: Coronary Stent Intervention;  Surgeon: Lennette Bihari, MD;  Location: [facility] INVASIVE CV LAB;  Service: Cardiovascular;  Laterality: N/A;  3.0x12 xience prox SVG to RCA   CARDIAC CATHETERIZATION  03/09/2000   hx/notes 03/20/2000   CHOLECYSTECTOMY N/A 09/16/2013   Procedure: LAPAROSCOPIC CHOLECYSTECTOMY CONVERTED TO OPEN CHOLECYSTECTOMY WITH CHOLANGIOGRAM;  Surgeon: Shelly Rubenstein, MD;  Location: [facility] OR;  Service: General;  Laterality: N/A;   CORONARY ANGIOGRAPHY N/A 06/27/2019   Procedure: CORONARY ANGIOGRAPHY (CATH LAB);  Surgeon: Tonny Bollman, MD;  Location: [facility] INVASIVE CV LAB;  Service: Cardiovascular;  Laterality: N/A;   CORONARY ANGIOPLASTY WITH STENT PLACEMENT  11/2011, 02/2012, 01/2015   DES to SVG-RCA both times, In [place], [place], Dr. Unice Cobble   CORONARY ARTERY BYPASS GRAFT  1998   LIMA-LAD, SVG-OM, SVG-RCA   ERCP N/A 09/19/2013   Procedure: ENDOSCOPIC RETROGRADE CHOLANGIOPANCREATOGRAPHY (ERCP);  Surgeon: Louis Meckel, MD;  Location: [facility] ENDOSCOPY;  Service: Endoscopy;  Laterality: N/A;   FINGER SURGERY Right    ring finger "fused"   FRACTURE SURGERY     IRRIGATION AND DEBRIDEMENT ABSCESS Right 10/17/2013   Procedure: INCISION AND DRAINAGE RIGHT SUBCOSTAL WOUND;  Surgeon: Kandis Cocking, MD;  Location: [facility] ORS;  Service: General;  Laterality: Right;   JOINT REPLACEMENT     KNEE ARTHROSCOPY Right 1998   LEFT HEART CATH AND CORS/GRAFTS  ANGIOGRAPHY N/A 07/25/2016   Procedure: Left Heart Cath and Cors/Grafts Angiography;  Surgeon: Corky Crafts, MD;  Location: [facility] INVASIVE CV LAB;  Service: Cardiovascular;  Laterality: N/A;   LEFT HEART CATH AND CORS/GRAFTS ANGIOGRAPHY N/A 06/05/2019   Procedure: LEFT HEART CATH AND CORS/GRAFTS ANGIOGRAPHY;  Surgeon: Kathleene Hazel, MD;  Location: [facility] INVASIVE CV LAB;  Service: Cardiovascular;  Laterality: N/A;   LEFT HEART CATHETERIZATION WITH CORONARY/GRAFT ANGIOGRAM N/A 07/19/2013   Procedure: LEFT HEART CATHETERIZATION WITH Isabel Caprice;  Surgeon: Marykay Lex, MD;  Location: [facility] CATH LAB;  Service: Cardiovascular;  Laterality: N/A;   MASS EXCISION Right 06/25/2014   Procedure: EXCISION BUTTOCK MASS ;  Surgeon:  Abigail Miyamoto, MD;  Location: [facility];  Service: General;  Laterality: Right;   ORIF HUMERUS FRACTURE Left 08/27/2013   Procedure: OPEN REDUCTION INTERNAL FIXATION (ORIF) LEFT HUMERUS ;  Surgeon: Budd Palmer, MD;  Location: [facility] OR;  Service: Orthopedics;  Laterality: Left;   PLACEMENT OF IMPELLA LEFT VENTRICULAR ASSIST DEVICE N/A 06/24/2019   Procedure: PLACEMENT OF IMPELLA LEFT VENTRICULAR ASSIST DEVICE;  Surgeon: Linden Dolin, MD;  Location: [facility] OR;  Service: Open Heart Surgery;  Laterality: N/A;   POSTERIOR FUSION CERVICAL SPINE  1992   REMOVAL OF IMPELLA LEFT VENTRICULAR ASSIST DEVICE Right 06/28/2019   Procedure: REMOVAL OF IMPELLA LEFT VENTRICULAR ASSIST DEVICE;  Surgeon: Linden Dolin, MD;  Location: [facility] OR;  Service: Open Heart Surgery;  Laterality: Right;   THROMBECTOMY / EMBOLECTOMY FEMORAL ARTERY Right    hx/notes 03/20/2000   TOTAL HIP ARTHROPLASTY Right 07/19/2016   Procedure: TOTAL HIP ARTHROPLASTY ANTERIOR APPROACH;  Surgeon: Sheral Apley, MD;  Location: [facility] OR;  Service: Orthopedics;  Laterality: Right;   TOTAL HIP REVISION Right 08/22/2017   Procedure: TOTAL HIP REVISION;  Surgeon: Sheral Apley, MD;  Location:  [facility] OR;  Service: Orthopedics;  Laterality: Right;   Family History  Problem Relation Age of Onset   Lung cancer Mother    Clotting disorder Father    Lung cancer Sister    Heart disease Brother    Heart disease Brother    Colon cancer Neg Hx    Breast cancer Neg Hx    Social History   Socioeconomic History   Marital status: Divorced    Spouse name: Not on file   Number of children: 5   Years of education: Not on file   Highest education level: Not on file  Occupational History   Occupation: Disabled  Tobacco Use   Smoking status: Former    Packs/day: 0.50    Years: 34.00    Additional pack years: 0.00    Total pack years: 17.00    Types: Cigarettes    Quit date: 07/20/1998    Years since quitting: 24.0   Smokeless tobacco: Never  Vaping Use   Vaping Use: Never used  Substance and Sexual Activity   Alcohol use: Not Currently   Drug use: Yes    Types: Marijuana    Comment: ediables   Sexual activity: Not Currently  Other Topics Concern   Not on file  Social History Narrative   Lives with best friend.    Social Determinants of Health   Financial Resource Strain: Not on file  Food Insecurity: No Food Insecurity (07/24/2022)   Hunger Vital Sign    Worried About Running Out of Food in the Last Year: Never true    Ran Out of Food in the Last Year: Never true  Transportation Needs: No Transportation Needs (07/24/2022)   PRAPARE - Administrator, Civil Service (Medical): No    Lack of Transportation (Non-Medical): No  Physical Activity: Not on file  Stress: Not on file  Social Connections: Not on file   Allergies  Allergen Reactions   Benadryl [Diphenhydramine] Shortness Of Breath   Penicillins Diarrhea    Did it involve swelling of the face/tongue/throat, SOB, or low BP? No Did it involve sudden or severe rash/hives, skin peeling, or any reaction on the inside of your mouth or nose? No Did you need to seek medical attention at a hospital or doctor's  office? No When did it last  happen?      2 months If all above answers are "NO", may proceed with cephalosporin use.   Adhesive [Tape] Other (See Comments)    Burning of skin   Amoxicillin Diarrhea and Other (See Comments)    Has patient had a PCN reaction causing immediate rash, facial/tongue/throat swelling, SOB or lightheadedness with hypotension: No Has patient had a PCN reaction causing severe rash involving mucus membranes or skin necrosis: No Has patient had a PCN reaction that required hospitalization No Has patient had a PCN reaction occurring within the last 10 years: Yes -- noted rxn as diarrhea If all of the above answers are "NO", then may proceed with Cephalosporin use.    Hydrocodone-Acetaminophen Itching and Nausea And Vomiting   Cyclobenzaprine Other (See Comments)    Causes restless leg syndrome Restless syndrome   Hydrocodone Itching    Medications   Facility-Administered Medications Prior to Admission  Medication Dose Route Frequency Provider Last Rate Last Admin   ferrous sulfate tablet 325 mg  325 mg Oral BID WC Hilarie Fredrickson, MD       Medications Prior to Admission  Medication Sig Dispense Refill Last Dose   acetaminophen (TYLENOL) 500 MG tablet Take 500-1,000 mg by mouth every 6 (six) hours as needed (for pain.).   Past Month   albuterol (PROVENTIL HFA;VENTOLIN HFA) 108 (90 Base) MCG/ACT inhaler Inhale 2 puffs into the lungs every 6 (six) hours as needed for wheezing or shortness of breath. 1 each 1 07/23/2022   albuterol (PROVENTIL) (2.5 MG/3ML) 0.083% nebulizer solution Inhale 2.5 mg into the lungs 3 (three) times daily as needed for shortness of breath or wheezing.   Past Month   ALPRAZolam (XANAX) 0.5 MG tablet Take 0.5 mg by mouth 3 (three) times daily as needed for anxiety.   07/23/2022   apixaban (ELIQUIS) 5 MG TABS tablet Take 1 tablet (5 mg total) by mouth 2 (two) times daily. 180 tablet 3 07/23/2022 at 22:00   atorvastatin (LIPITOR) 80 MG tablet Take 1  tablet (80 mg total) by mouth daily. 90 tablet 3 07/23/2022   buPROPion (WELLBUTRIN XL) 150 MG 24 hr tablet Take 150 mg by mouth daily.   07/23/2022   ezetimibe (ZETIA) 10 MG tablet Take 10 mg by mouth daily.   07/23/2022   Ferrous Sulfate Dried (SLOW RELEASE IRON) 45 MG TBCR Take by mouth.   Past Month   fluticasone-salmeterol (ADVAIR) 250-50 MCG/ACT AEPB Inhale 1 puff into the lungs 2 (two) times daily.   07/23/2022   furosemide (LASIX) 20 MG tablet Take 1 tablet (20 mg total) by mouth as needed for fluid or edema. 30 tablet 1 Past Week   isosorbide mononitrate (IMDUR) 30 MG 24 hr tablet Take 1 tablet (30 mg total) by mouth daily. 90 tablet 3 07/23/2022 at 10:00   levothyroxine (SYNTHROID) 50 MCG tablet Take 50 mcg by mouth daily before breakfast.   07/23/2022   nitroGLYCERIN (NITROSTAT) 0.4 MG SL tablet Place 1 tablet (0.4 mg total) under the tongue every 5 (five) minutes as needed for chest pain. 25 tablet 2 Past Month   oxyCODONE 10 MG TABS Take 1 tablet (10 mg total) by mouth 3 (three) times daily. 30 tablet 0 Past Week   pantoprazole (PROTONIX) 40 MG tablet Take 1 tablet (40 mg total) by mouth daily. 90 tablet 3 07/23/2022   pramipexole (MIRAPEX) 0.25 MG tablet Take 0.25 mg by mouth at bedtime.    07/23/2022   ranolazine (RANEXA) 1000 MG SR  tablet Take 1 tablet (1,000 mg total) by mouth 2 (two) times daily. 180 tablet 3 07/23/2022   sacubitril-valsartan (ENTRESTO) 49-51 MG Take 1 tablet by mouth 2 (two) times daily. 180 tablet 3 07/23/2022   sertraline (ZOLOFT) 100 MG tablet Take 150 mg by mouth at bedtime.    07/23/2022   spironolactone (ALDACTONE) 25 MG tablet Take 1 tablet (25 mg total) by mouth daily. 90 tablet 3 07/23/2022   promethazine (PHENERGAN) 12.5 MG tablet TAKE ONE TABLET BY MOUTH EVERY 6 HOURS AS NEEDED FOR NAUSEA AND VOMITING (Patient taking differently: Take 12.5 mg by mouth as needed for vomiting or nausea.) 30 tablet 3 unknown   Vitamin D, Ergocalciferol, (DRISDOL) 1.25 MG (50000 UNIT)  CAPS capsule Take 50,000 Units by mouth every 7 (seven) days.   unknown      Current Facility-Administered Medications:    acetaminophen (TYLENOL) tablet 650 mg, 650 mg, Oral, Q6H PRN **OR** acetaminophen (TYLENOL) suppository 650 mg, 650 mg, Rectal, Q6H PRN, Orland Mustard, MD   albuterol (PROVENTIL) (2.5 MG/3ML) 0.083% nebulizer solution 2.5 mg, 2.5 mg, Inhalation, TID PRN, Orland Mustard, MD   ALPRAZolam Prudy Feeler) tablet 0.5 mg, 0.5 mg, Oral, BID PRN, Orland Mustard, MD, 0.5 mg at 07/25/22 9604   apixaban (ELIQUIS) tablet 5 mg, 5 mg, Oral, BID, Orland Mustard, MD, 5 mg at 07/25/22 0803   atorvastatin (LIPITOR) tablet 80 mg, 80 mg, Oral, Daily, Orland Mustard, MD, 80 mg at 07/25/22 5409   buPROPion (WELLBUTRIN XL) 24 hr tablet 150 mg, 150 mg, Oral, Daily, Orland Mustard, MD, 150 mg at 07/25/22 0804   ezetimibe (ZETIA) tablet 10 mg, 10 mg, Oral, Daily, Orland Mustard, MD, 10 mg at 07/25/22 0804   hydrocortisone (CORTEF) tablet 5 mg, 5 mg, Oral, BID, Orland Mustard, MD, 5 mg at 07/25/22 8119   isosorbide mononitrate (IMDUR) 24 hr tablet 30 mg, 30 mg, Oral, Daily, Orland Mustard, MD, 30 mg at 07/25/22 1150   levothyroxine (SYNTHROID) tablet 50 mcg, 50 mcg, Oral, QAC breakfast, Orland Mustard, MD, 50 mcg at 07/25/22 1478   oxyCODONE (Oxy IR/ROXICODONE) immediate release tablet 10 mg, 10 mg, Oral, TID PRN, Orland Mustard, MD, 10 mg at 07/25/22 0808   pantoprazole (PROTONIX) EC tablet 40 mg, 40 mg, Oral, Daily, Orland Mustard, MD, 40 mg at 07/25/22 2956   pramipexole (MIRAPEX) tablet 0.25 mg, 0.25 mg, Oral, QHS, Orland Mustard, MD, 0.25 mg at 07/24/22 2124   ranolazine (RANEXA) 12 hr tablet 1,000 mg, 1,000 mg, Oral, BID, Orland Mustard, MD, 1,000 mg at 07/25/22 2130   sertraline (ZOLOFT) tablet 150 mg, 150 mg, Oral, QHS, Orland Mustard, MD, 150 mg at 07/24/22 2117  Vitals   Vitals:   07/24/22 2116 07/25/22 0003 07/25/22 0415 07/25/22 0801  BP: (!) 94/52 (!) 134/54 (!) 125/54 (!) 108/53  Pulse:  (!) 57   (!) 56  Resp:  13 15 16   Temp:   98.2 F (36.8 C) 98.4 F (36.9 C)  TempSrc:   Oral Oral  SpO2: 97% 97% 98% 99%  Weight:   57.3 kg   Height:         Body mass index is 23.88 kg/m.  Physical Exam   Physical Exam Gen: A&O x4, NAD HEENT: Atraumatic, normocephalic;mucous membranes moist; oropharynx clear, tongue without atrophy or fasciculations. Neck: Supple, trachea midline. Resp: CTAB, no w/r/r CV: RRR, no m/g/r; nml S1 and S2. 2+ symmetric peripheral pulses. Abd: soft/NT/ND; nabs x 4 quad Extrem: Nml bulk; no cyanosis, clubbing, or edema.  Neuro: *  MS: A&O x4. Follows multi-step commands.  *Speech: fluid, nondysarthric, able to name and repeat *CN:    I: Deferred   II,III: PERRLA, VFF by confrontation, optic discs unable to be visualized 2/2 pupillary constriction   III,IV,VI: EOMI w/o nystagmus, no ptosis   V: Sensation intact from V1 to V3 to LT   VII: Eyelid closure was full.  Smile symmetric.   VIII: Hearing intact to voice   IX,X: Voice normal, palate elevates symmetrically    XI: SCM/trap 5/5 bilat   XII: Tongue protrudes midline, no atrophy or fasciculations   *Motor:   Normal bulk.  No tremor, rigidity or bradykinesia. No pronator drift.    Strength: Dlt Bic Tri WrE WrF FgS Gr HF KnF KnE PlF DoF    Left 5 5 5 5 5 5 5 5 5 5 5 5     Right 5 5 5 5 5 5 5 5 5 5 5 5     *Sensory: Intact to light touch, pinprick, temperature vibration throughout. Symmetric. Propioception intact bilat.  No double-simultaneous extinction.  *Coordination:  Finger-to-nose, heel-to-shin, rapid alternating motions were intact. *Reflexes:  2+ and symmetric throughout without clonus; toes down-going bilat *Gait: normal base, normal stride, normal turn.   No abnormal movements were observed during today's exam  Labs   CBC:  Recent Labs  Lab 07/24/22 0114  WBC 7.6  NEUTROABS 6.0  HGB 11.8*  HCT 37.9  MCV 97.2  PLT 161    Basic Metabolic Panel:  Lab Results  Component  Value Date   NA 134 (L) 07/25/2022   K 5.5 (H) 07/25/2022   CO2 22 07/25/2022   GLUCOSE 92 07/25/2022   BUN 29 (H) 07/25/2022   CREATININE 1.27 (H) 07/25/2022   CALCIUM 8.6 (L) 07/25/2022   GFRNONAA 45 (L) 07/25/2022   GFRAA >60 09/22/2019   Lipid Panel:  Lab Results  Component Value Date   LDLCALC 33 01/11/2022   HgbA1c:  Lab Results  Component Value Date   HGBA1C 5.4 06/24/2019   Urine Drug Screen:     Component Value Date/Time   LABOPIA POSITIVE (A) 07/24/2022 0122   COCAINSCRNUR NONE DETECTED 07/24/2022 0122   LABBENZ POSITIVE (A) 07/24/2022 0122   AMPHETMU NONE DETECTED 07/24/2022 0122   THCU POSITIVE (A) 07/24/2022 0122   LABBARB NONE DETECTED 07/24/2022 0122    Alcohol Level     Component Value Date/Time   ETH <10 07/24/2022 0114    CT Head without contrast: No acute process personal review  Impression   This is a 72 yo woman with hx anxiety, asthma, COPD, chronic combined systolic and diastolic heart failure, CAD, depression, hyperlipidemia, history of DVT x 3 as well as pAF on eliquis who presents after spell of involuntary movements.  She has had similar but milder spells of non-rhythmic asynchronous movements over the past 4 years without clear trigger and has not been previously worked up. These typically last for about one hour and can wake her up from sleep but also start when she is awake. Frontal lobe seizures are on the differential; these may present with movements that are atypical from usual and can have variable associated cognitive deficits. She is on wellbutrin. We will perform MRI brain wwo and rEEG for further workup.  If inpatient workup is unrevealing she would benefit from referral to an outpatient neurologist.  She does live with her nephew and I asked her to ask her nephew to take a video of her next spell with his  camera to show the outpatient neurologist.  It would be very helpful to see what the spells actually look like.   Recommendations    - MRI brain wwo - rEEG - Will ask patient if she is comfortable holding wellbutrin since this can decrease seizure threshold - Will continue to follow ______________________________________________________________________   Thank you for the opportunity to take part in the care of this patient. If you have any further questions, please contact the neurology consultation attending.  Signed,  Bing Neighbors, MD [facility] [phone]  If 7pm- 7am, please page neurology on call as listed in AMION.  **Any copied and pasted documentation in this note was written by me in another application not billed for and pasted by me into this document.

## 2022-07-25 NOTE — Progress Notes (Signed)
PROGRESS NOTE    Wendy [name]  ZOX:[id number] DOB: [birth year] DOA: 07/24/2022 PCP: Jac Canavan, PA-C   Brief Narrative:  71 y.o. female with medical history significant of COPD, OSA, CAD, PAD, PAF on Eliquis, HFpEF, anxiety and depression, and substance abuse who presented to ED with complaints of a "shaky spell." She states this episode woke her up in the middle of the night. She has had these intermittent spells x 4 years.  She woke up jerking. She states she has had this happen in the past, but this was the worst it has been. She had her nephew call 911. Per note she did not want to come to hospital and threw herself backward and her head hit the wall. She tells me that she wanted to lay down and hit her head on the wall. She tells me she has these jerking spells not often. She feels like her legs turn to jelly and she can't walk. They can happen anytime of the day. Can last for 3 hours and she is shaking the whole time. She has no chest pain/palpitations, N/V, dizziness or other associated symptoms. She does have panic attacks, but states these are different. She never loses consciousness. She denies any new medication.  She has had a cold with productive cough. Saw her PCP on Friday and was not given any medication. She was given an antibiotic 3 weeks ago.  She does admit to [facility] edibles, but states this is not related to her jerky episodes.  Denies any fever/chills, headaches, chest pain or palpitations, shortness of breath, abdominal pain, N/V/D, dysuria or leg swelling.  She has a poor appetite and doesn't drink much water.  She has low blood pressure readings at  home to 70/30.  She does not smoke or drink alcohol.  Assessment & Plan:   Principal Problem:   Hypotension Active Problems:   Sinus bradycardia   PAF (paroxysmal atrial fibrillation) (HCC)   Hyperkalemia   Coronary artery disease involving native coronary artery of native heart with angina pectoris (HCC)   (HFpEF)  heart failure with preserved ejection fraction (HCC)   PAD (peripheral artery disease) (HCC)   Essential hypertension   HLD (hyperlipidemia)   Anxiety   Severe major depression, single episode, without psychotic features (HCC)   GERD (gastroesophageal reflux disease)   Restless leg syndrome   COPD (chronic obstructive pulmonary disease) (HCC)   Sleep apnea   Marijuana abuse   #1 hypotension with bradycardia on admission resolved with IV fluids and holding cardiac medications including amiodarone and spironolactone Lasix Imdur Entresto. TSH normal Cortisol low  #2 jerking episodes for the last 4 years of unclear etiology  #3 paroxysmal atrial fibrillation on Eliquis Amiodarone was held due to bradycardia  #4 hyperkalemia will treat with Lokelma.  #5 rule out seizure disorder patient admitted with jerking movements Neurology consulted and appreciated. Possibly hold Wellbutrin versus DC as it lowers seizure threshold.   #6 CAD with CABG stable outpatient medications have been on hold due to hypotension Continue aspirin Lipitor and Ranexa  #7 diastolic heart failure holding heart failure medications due to hypotension  #8 hyperlipidemia on statin and Zetia  #9  History of PAD on aspirin and statin  #10 anxiety on Zoloft and Xanax  #11 restless leg syndrome on Mirapex  #12 GERD on Protonix  #13 depression on Wellbutrin ?  Possibly DC rule out seizures  #14 adrenal insufficiency question low cortisol check cosyntropin stimulation test patient has been started on  hydrocortisone.  Estimated body mass index is 23.88 kg/m as calculated from the following:   Height as of this encounter: 5\' 1"  (1.549 m).   Weight as of this encounter: 57.3 kg.  DVT prophylaxis: Lovenox  code Status: Full code Family Communication: Disposition Plan:  Status is: Inpatient Remains inpatient appropriate because: hypotension   Consultants: neuro cardiology  Procedures:  none Antimicrobials: none Subjective: Denies any jerking movement since admission to hospital Denies dizziness chest pain incontinence  Objective: Vitals:   07/24/22 2116 07/25/22 0003 07/25/22 0415 07/25/22 0801  BP: (!) 94/52 (!) 134/54 (!) 125/54 (!) 108/53  Pulse: (!) 57   (!) 56  Resp:  13 15 16   Temp:   98.2 F (36.8 C) 98.4 F (36.9 C)  TempSrc:   Oral Oral  SpO2: 97% 97% 98% 99%  Weight:   57.3 kg   Height:       No intake or output data in the 24 hours ending 07/25/22 1218 Filed Weights   07/24/22 0130 07/24/22 1812 07/25/22 0415  Weight: 53 kg 58.1 kg 57.3 kg    Examination:  General exam: Appears in nad Respiratory system: Clear to auscultation. Respiratory effort normal. Cardiovascular system: S1 & S2 heard, RRR. No JVD, murmurs, rubs, gallops or clicks. No pedal edema. Gastrointestinal system: Abdomen is nondistended, soft and nontender. No organomegaly or masses felt. Normal bowel sounds heard. Central nervous system: Alert and oriented. Extremities: Symmetric 5 x 5 power. Skin: No rashes, lesions or ulcers Psychiatry: Judgement and insight appear normal. Mood & affect appropriate.     Data Reviewed: I have personally reviewed following labs and imaging studies  CBC: Recent Labs  Lab 07/24/22 0114  WBC 7.6  NEUTROABS 6.0  HGB 11.8*  HCT 37.9  MCV 97.2  PLT 161   Basic Metabolic Panel: Recent Labs  Lab 07/24/22 0114 07/24/22 0736 07/24/22 1324 07/25/22 0346  NA 137  --  138 134*  K 6.0*  --  5.2* 5.5*  CL 111  --  112* 106  CO2 20*  --  21* 22  GLUCOSE 98  --  111* 92  BUN 40*  --  30* 29*  CREATININE 1.15*  --  1.01* 1.27*  CALCIUM 8.5*  --  7.9* 8.6*  MG  --  2.0  --   --    GFR: Estimated Creatinine Clearance: 31.1 mL/min (A) (by C-G formula based on SCr of 1.27 mg/dL (H)). Liver Function Tests: Recent Labs  Lab 07/24/22 0114  AST 22  ALT 20  ALKPHOS 50  BILITOT 0.5  PROT 5.9*  ALBUMIN 2.8*   No results for input(s):  "LIPASE", "AMYLASE" in the last 168 hours. Recent Labs  Lab 07/24/22 0114  AMMONIA 14   Coagulation Profile: No results for input(s): "INR", "PROTIME" in the last 168 hours. Cardiac Enzymes: Recent Labs  Lab 07/24/22 0736  CKTOTAL 44   BNP (last 3 results) Recent Labs    02/15/22 1614  PROBNP 523*   HbA1C: No results for input(s): "HGBA1C" in the last 72 hours. CBG: No results for input(s): "GLUCAP" in the last 168 hours. Lipid Profile: No results for input(s): "CHOL", "HDL", "LDLCALC", "TRIG", "CHOLHDL", "LDLDIRECT" in the last 72 hours. Thyroid Function Tests: Recent Labs    07/24/22 0736  TSH 0.802   Anemia Panel: No results for input(s): "VITAMINB12", "FOLATE", "FERRITIN", "TIBC", "IRON", "RETICCTPCT" in the last 72 hours. Sepsis Labs: Recent Labs  Lab 07/24/22 0155  LATICACIDVEN 1.9    Recent  Results (from the past 240 hour(s))  SARS Coronavirus 2 by RT PCR (hospital order, performed in [facility] hospital lab) *cepheid single result test* Anterior Nasal Swab     Status: None   Collection Time: 07/24/22  1:55 AM   Specimen: Anterior Nasal Swab  Result Value Ref Range Status   SARS Coronavirus 2 by RT PCR NEGATIVE NEGATIVE Final    Comment: Performed at [facility], [address] [place], [place] [address]         Radiology Studies: CT HEAD WO CONTRAST ( )  Result Date: 07/24/2022 CLINICAL DATA:  Trauma. EXAM: CT HEAD WITHOUT CONTRAST CT CERVICAL SPINE WITHOUT CONTRAST TECHNIQUE: Multidetector CT imaging of the head and cervical spine was performed following the standard protocol without intravenous contrast. Multiplanar CT image reconstructions of the cervical spine were also generated. RADIATION DOSE REDUCTION: This exam was performed according to the departmental dose-optimization program which includes automated exposure control, adjustment of the mA and/or kV according to patient size and/or use of iterative reconstruction technique.  COMPARISON:  Head CT dated 09/19/2019. FINDINGS: CT HEAD FINDINGS Brain: Mild age-related atrophy and chronic microvascular ischemic changes. There is no acute intracranial hemorrhage. No mass effect or midline shift. No extra-axial fluid collection. Vascular: No hyperdense vessel or unexpected calcification. Skull: Normal. Negative for fracture or focal lesion. Sinuses/Orbits: There is mucoperiosteal thickening of paranasal sinuses. No air-fluid level. Mild left mastoid effusions. Other: None CT CERVICAL SPINE FINDINGS Alignment: No acute subluxation. There is straightening of normal cervical lordosis which may be positional or due to muscle spasm. Skull base and vertebrae: No acute fracture.  Osteopenia. Soft tissues and spinal canal: No prevertebral fluid or swelling. No visible canal hematoma. Disc levels: No acute findings. Degenerative changes. C5-C6 ankylosis. Upper chest: Negative. Other: Right thyroid nodule measures 13 mm. This has been evaluated on previous imaging. (ref: J Am Coll Radiol. 2015 Feb;12(2): 143-50). IMPRESSION: 1. No acute intracranial pathology. Mild age-related atrophy and chronic microvascular ischemic changes. 2. No acute/traumatic cervical spine pathology. Electronically Signed   By: Elgie Collard M.D.   On: 07/24/2022 03:35   CT CERVICAL SPINE WO CONTRAST  Result Date: 07/24/2022 CLINICAL DATA:  Trauma. EXAM: CT HEAD WITHOUT CONTRAST CT CERVICAL SPINE WITHOUT CONTRAST TECHNIQUE: Multidetector CT imaging of the head and cervical spine was performed following the standard protocol without intravenous contrast. Multiplanar CT image reconstructions of the cervical spine were also generated. RADIATION DOSE REDUCTION: This exam was performed according to the departmental dose-optimization program which includes automated exposure control, adjustment of the mA and/or kV according to patient size and/or use of iterative reconstruction technique. COMPARISON:  Head CT dated 09/19/2019.  FINDINGS: CT HEAD FINDINGS Brain: Mild age-related atrophy and chronic microvascular ischemic changes. There is no acute intracranial hemorrhage. No mass effect or midline shift. No extra-axial fluid collection. Vascular: No hyperdense vessel or unexpected calcification. Skull: Normal. Negative for fracture or focal lesion. Sinuses/Orbits: There is mucoperiosteal thickening of paranasal sinuses. No air-fluid level. Mild left mastoid effusions. Other: None CT CERVICAL SPINE FINDINGS Alignment: No acute subluxation. There is straightening of normal cervical lordosis which may be positional or due to muscle spasm. Skull base and vertebrae: No acute fracture.  Osteopenia. Soft tissues and spinal canal: No prevertebral fluid or swelling. No visible canal hematoma. Disc levels: No acute findings. Degenerative changes. C5-C6 ankylosis. Upper chest: Negative. Other: Right thyroid nodule measures 13 mm. This has been evaluated on previous imaging. (ref: J Am Coll Radiol. 2015 Feb;12(2): 143-50).  IMPRESSION: 1. No acute intracranial pathology. Mild age-related atrophy and chronic microvascular ischemic changes. 2. No acute/traumatic cervical spine pathology. Electronically Signed   By: Elgie Collard M.D.   On: 07/24/2022 03:35   DG Chest 2 View  Result Date: 07/24/2022 CLINICAL DATA:  Cough EXAM: CHEST - 2 VIEW COMPARISON:  02/21/2022 FINDINGS: Lungs are clear. No pneumothorax or pleural effusion. Coronary artery bypass grafting has been performed. Cardiac size within normal limits. Pulmonary vascularity is normal. No acute bone abnormality. IMPRESSION: 1. No active cardiopulmonary disease. Electronically Signed   By: Helyn Numbers M.D.   On: 07/24/2022 01:56        Scheduled Meds:  apixaban  5 mg Oral BID   atorvastatin  80 mg Oral Daily   buPROPion  150 mg Oral Daily   ezetimibe  10 mg Oral Daily   hydrocortisone  5 mg Oral BID   isosorbide mononitrate  30 mg Oral Daily   levothyroxine  50 mcg Oral QAC  breakfast   pantoprazole  40 mg Oral Daily   pramipexole  0.25 mg Oral QHS   ranolazine  1,000 mg Oral BID   sertraline  150 mg Oral QHS   Continuous Infusions:   LOS: 1 day    Time spent: 38 min  Alwyn Ren, MD  07/25/2022, 12:18 PM

## 2022-07-25 NOTE — Progress Notes (Signed)
EEG complete - results pending 

## 2022-07-26 ENCOUNTER — Other Ambulatory Visit: Payer: Self-pay | Admitting: Home Health

## 2022-07-26 ENCOUNTER — Encounter: Payer: Self-pay | Admitting: *Deleted

## 2022-07-26 ENCOUNTER — Inpatient Hospital Stay: Payer: Medicare HMO

## 2022-07-26 DIAGNOSIS — R259 Unspecified abnormal involuntary movements: Secondary | ICD-10-CM | POA: Diagnosis not present

## 2022-07-26 DIAGNOSIS — I9589 Other hypotension: Secondary | ICD-10-CM

## 2022-07-26 DIAGNOSIS — R4182 Altered mental status, unspecified: Secondary | ICD-10-CM | POA: Diagnosis not present

## 2022-07-26 DIAGNOSIS — E875 Hyperkalemia: Secondary | ICD-10-CM

## 2022-07-26 DIAGNOSIS — R001 Bradycardia, unspecified: Secondary | ICD-10-CM

## 2022-07-26 DIAGNOSIS — I25119 Atherosclerotic heart disease of native coronary artery with unspecified angina pectoris: Secondary | ICD-10-CM | POA: Diagnosis not present

## 2022-07-26 DIAGNOSIS — I5032 Chronic diastolic (congestive) heart failure: Secondary | ICD-10-CM | POA: Diagnosis not present

## 2022-07-26 DIAGNOSIS — I10 Essential (primary) hypertension: Secondary | ICD-10-CM

## 2022-07-26 LAB — BASIC METABOLIC PANEL
Anion gap: 9 (ref 5–15)
BUN: 24 mg/dL — ABNORMAL HIGH (ref 8–23)
CO2: 20 mmol/L — ABNORMAL LOW (ref 22–32)
Calcium: 8.4 mg/dL — ABNORMAL LOW (ref 8.9–10.3)
Chloride: 110 mmol/L (ref 98–111)
Creatinine, Ser: 0.98 mg/dL (ref 0.44–1.00)
GFR, Estimated: >60 mL/min (ref >60)
Glucose, Bld: 82 mg/dL (ref 70–99)
Potassium: 4.9 mmol/L (ref 3.5–5.1)
Sodium: 139 mmol/L (ref 135–145)

## 2022-07-26 LAB — ACTH STIMULATION, 3 TIME POINTS
Cortisol, 30 Min: 16.6 ug/dL
Cortisol, 60 Min: 23.9 ug/dL
Cortisol, Base: 6.6 ug/dL

## 2022-07-26 MED ORDER — HYDROCORTISONE 5 MG PO TABS: 5.0000 mg | ORAL_TABLET | Freq: Every day | ORAL | 0 refills | Status: DC

## 2022-07-26 NOTE — Progress Notes (Unsigned)
Enrolled for [employer] to mail a ZIO XT long term holter monitor to the patients address on file.   Letter with instructions mailed to patient. 

## 2022-07-26 NOTE — Discharge Summary (Signed)
Physician Discharge Summary  [name]:[id number] DOB: 1950/08/08 DOA: 07/24/2022  PCP: Jac Canavan, PA-C  Admit date: 07/24/2022 Discharge date: 07/26/2022  Admitted From: Home Disposition: Home  Recommendations for Outpatient Follow-up:  Follow up with PCP in 1-2 weeks Please obtain BMP/CBC in one week Please follow up on the following pending results: ACTH cosyntropin stimulation test  Home Health: None Equipment/Devices: None Discharge Condition: Stable CODE STATUS: Full code Diet recommendation: Cardiac Brief/Interim Summary:72 y.o. female with medical history significant of COPD, OSA, CAD, PAD, PAF on Eliquis, HFpEF, anxiety and depression, and substance abuse who presented to ED with complaints of a "shaky spell." She states this episode woke her up in the middle of the night. She has had these intermittent spells x 4 years.  She woke up jerking. She states she has had this happen in the past, but this was the worst it has been. She had her nephew call 911. Per note she did not want to come to hospital and threw herself backward and her head hit the wall. She tells me that she wanted to lay down and hit her head on the wall. She tells me she has these jerking spells not often. She feels like her legs turn to jelly and she can't walk. They can happen anytime of the day. Can last for 3 hours and she is shaking the whole time. She has no chest pain/palpitations, N/V, dizziness or other associated symptoms. She does have panic attacks, but states these are different. She never loses consciousness. She denies any new medication.  She has had a cold with productive cough. Saw her PCP on Friday and was not given any medication. She was given an antibiotic 3 weeks ago.  She does admit to [facility] edibles, but states this is not related to her jerky episodes.  Denies any fever/chills, headaches, chest pain or palpitations, shortness of breath, abdominal pain, N/V/D, dysuria or leg  swelling.  She has a poor appetite and doesn't drink much water.  She has low blood pressure readings at  home to 70/30.  She does not smoke or drink alcohol.   Discharge Diagnoses:  Principal Problem:   Hypotension Active Problems:   Sinus bradycardia   PAF (paroxysmal atrial fibrillation) (HCC)   Hyperkalemia   Coronary artery disease involving native coronary artery of native heart with angina pectoris (HCC)   (HFpEF) heart failure with preserved ejection fraction (HCC)   PAD (peripheral artery disease) (HCC)   Essential hypertension   HLD (hyperlipidemia)   Anxiety   Severe major depression, single episode, without psychotic features (HCC)   GERD (gastroesophageal reflux disease)   Restless leg syndrome   COPD (chronic obstructive pulmonary disease) (HCC)   Sleep apnea   Marijuana abuse   Altered mental status    #1 hypotension with bradycardia on admission resolved with IV fluids and holding cardiac medications including amiodarone and spironolactone Lasix Imdur Entresto. TSH normal Cortisol low Patient has appointment with Dr. Elease Hashimoto on 08/24/2022 Cardiology will arrange for Zio patch Cardiology recommended discharge patient on Eliquis, Zetia and statin, Imdur and Ranexa.  Lasix as needed, Sherryll Burger has been stopped on discharge cardiology will follow-up when to start [facility].   #2 jerking episodes for the last 4 years of unclear etiology She was seen by cardiology during this hospital stay.  MRI of the brain was normal, EEG was normal.  She was asked to follow-up with outpatient neurology.  She was also told to have her nephew do  a video of her spells next time it happens.  Patient lives with her nephew.  At this time Wellbutrin has been continued since there were no seizures noted on EEG.   #3 paroxysmal atrial fibrillation on Eliquis Amiodarone was held due to bradycardia on discharge.   #4 hyperkalemia resolved potassium was 4.9 on discharge  #5 rule out seizure  disorder patient admitted with jerking movements EEG within normal limits MRI brain no acute findings    #6 CAD with CABG stable outpatient medications have been on hold due to hypotension Continue aspirin Lipitor and Ranexa   #7 diastolic heart failure holding heart failure medications due to hypotension   #8 hyperlipidemia on statin and Zetia   #9  History of PAD on aspirin and statin   #10 anxiety on Zoloft and Xanax   #11 restless leg syndrome on Mirapex   #12 GERD on Protonix   #13 depression on Wellbutrin    #14 adrenal insufficiency she was discharged on hydrocortisone for 15 days.  Cosyntropin stimulation test is pending at the time of discharge.     Estimated body mass index is 24.43 kg/m as calculated from the following:   Height as of this encounter: 5\' 1"  (1.549 m).   Weight as of this encounter: 58.7 kg.  Discharge Instructions  Discharge Instructions     Ambulatory referral to Neurology   Complete by: As directed    An appointment is requested in approximately: 6 wks   Diet - low sodium heart healthy   Complete by: As directed    Increase activity slowly   Complete by: As directed       Allergies as of 07/26/2022       Reactions   Benadryl [diphenhydramine] Shortness Of Breath   Penicillins Diarrhea   Did it involve swelling of the face/tongue/throat, SOB, or low BP? No Did it involve sudden or severe rash/hives, skin peeling, or any reaction on the inside of your mouth or nose? No Did you need to seek medical attention at a hospital or doctor's office? No When did it last happen?      2 months If all above answers are "NO", may proceed with cephalosporin use.   Adhesive [tape] Other (See Comments)   Burning of skin   Amoxicillin Diarrhea, Other (See Comments)   Has patient had a PCN reaction causing immediate rash, facial/tongue/throat swelling, SOB or lightheadedness with hypotension: No Has patient had a PCN reaction causing severe rash  involving mucus membranes or skin necrosis: No Has patient had a PCN reaction that required hospitalization No Has patient had a PCN reaction occurring within the last 10 years: Yes -- noted rxn as diarrhea If all of the above answers are "NO", then may proceed with Cephalosporin use.   Hydrocodone-acetaminophen Itching, Nausea And Vomiting   Cyclobenzaprine Other (See Comments)   Causes restless leg syndrome Restless syndrome   Hydrocodone Itching        Medication List     STOP taking these medications    Entresto 49-51 MG Generic drug: sacubitril-valsartan   spironolactone 25 MG tablet Commonly known as: ALDACTONE       TAKE these medications    acetaminophen 500 MG tablet Commonly known as: TYLENOL Take 500-1,000 mg by mouth every 6 (six) hours as needed (for pain.).   albuterol 108 (90 Base) MCG/ACT inhaler Commonly known as: VENTOLIN HFA Inhale 2 puffs into the lungs every 6 (six) hours as needed for wheezing or shortness of breath.  albuterol (2.5 MG/3ML) 0.083% nebulizer solution Commonly known as: PROVENTIL Inhale 2.5 mg into the lungs 3 (three) times daily as needed for shortness of breath or wheezing.   ALPRAZolam 0.5 MG tablet Commonly known as: XANAX Take 0.5 mg by mouth 3 (three) times daily as needed for anxiety.   apixaban 5 MG Tabs tablet Commonly known as: Eliquis Take 1 tablet (5 mg total) by mouth 2 (two) times daily.   atorvastatin 80 MG tablet Commonly known as: LIPITOR Take 1 tablet (80 mg total) by mouth daily.   buPROPion 150 MG 24 hr tablet Commonly known as: WELLBUTRIN XL Take 150 mg by mouth daily.   ezetimibe 10 MG tablet Commonly known as: ZETIA Take 10 mg by mouth daily.   fluticasone-salmeterol 250-50 MCG/ACT Aepb Commonly known as: ADVAIR Inhale 1 puff into the lungs 2 (two) times daily.   furosemide 20 MG tablet Commonly known as: LASIX Take 1 tablet (20 mg total) by mouth as needed for fluid or edema.    hydrocortisone 5 MG tablet Commonly known as: CORTEF Take 1 tablet (5 mg total) by mouth daily.   isosorbide mononitrate 30 MG 24 hr tablet Commonly known as: IMDUR Take 1 tablet (30 mg total) by mouth daily.   levothyroxine 50 MCG tablet Commonly known as: SYNTHROID Take 50 mcg by mouth daily before breakfast.   nitroGLYCERIN 0.4 MG SL tablet Commonly known as: NITROSTAT Place 1 tablet (0.4 mg total) under the tongue every 5 (five) minutes as needed for chest pain.   Oxycodone HCl 10 MG Tabs Take 1 tablet (10 mg total) by mouth 3 (three) times daily.   pantoprazole 40 MG tablet Commonly known as: PROTONIX Take 1 tablet (40 mg total) by mouth daily.   pramipexole 0.25 MG tablet Commonly known as: MIRAPEX Take 0.25 mg by mouth at bedtime.   promethazine 12.5 MG tablet Commonly known as: PHENERGAN TAKE ONE TABLET BY MOUTH EVERY 6 HOURS AS NEEDED FOR NAUSEA AND VOMITING What changed: See the new instructions.   ranolazine 1000 MG SR tablet Commonly known as: RANEXA Take 1 tablet (1,000 mg total) by mouth 2 (two) times daily.   sertraline 100 MG tablet Commonly known as: ZOLOFT Take 150 mg by mouth at bedtime.   Slow Release Iron 45 MG Tbcr Generic drug: Ferrous Sulfate Dried Take by mouth.   Vitamin D (Ergocalciferol) 1.25 MG (50000 UNIT) Caps capsule Commonly known as: DRISDOL Take 50,000 Units by mouth every 7 (seven) days.        Follow-up Information     Tysinger, Kermit Balo, PA-C Follow up.   Specialty: Family Medicine Contact information: [address] [place] [address] [phone]         [facility] Follow up.   Specialty: Neurology Contact information: [address] [place] [address] [phone]               Allergies  Allergen Reactions   Benadryl [Diphenhydramine] Shortness Of Breath   Penicillins Diarrhea    Did it involve swelling of the face/tongue/throat,  SOB, or low BP? No Did it involve sudden or severe rash/hives, skin peeling, or any reaction on the inside of your mouth or nose? No Did you need to seek medical attention at a hospital or doctor's office? No When did it last happen?      2 months If all above answers are "NO", may proceed with cephalosporin use.   Adhesive [Tape] Other (See Comments)  Burning of skin   Amoxicillin Diarrhea and Other (See Comments)    Has patient had a PCN reaction causing immediate rash, facial/tongue/throat swelling, SOB or lightheadedness with hypotension: No Has patient had a PCN reaction causing severe rash involving mucus membranes or skin necrosis: No Has patient had a PCN reaction that required hospitalization No Has patient had a PCN reaction occurring within the last 10 years: Yes -- noted rxn as diarrhea If all of the above answers are "NO", then may proceed with Cephalosporin use.    Hydrocodone-Acetaminophen Itching and Nausea And Vomiting   Cyclobenzaprine Other (See Comments)    Causes restless leg syndrome Restless syndrome   Hydrocodone Itching    Consultations:cardiology neurology   Procedures/Studies: MR BRAIN W WO CONTRAST  Result Date: 07/25/2022 CLINICAL DATA:  Seizure, new onset, no history of trauma. EXAM: MRI HEAD WITHOUT AND WITH CONTRAST TECHNIQUE: Multiplanar, multiecho pulse sequences of the brain and surrounding structures were obtained without and with intravenous contrast. CONTRAST:  5mL GADAVIST GADOBUTROL 1 MMOL/ML IV SOLN COMPARISON:  MRI brain 08/15/2016.  Head CT 07/24/2022. FINDINGS: Brain: No acute infarct or hemorrhage. No hydrocephalus or extra-axial collection. No foci of abnormal susceptibility. No mass or abnormal enhancement. Symmetric size and signal of the hippocampi. No evidence of cortical dysgenesis. Vascular: Normal flow voids and vessel enhancement. Skull and upper cervical spine: Normal marrow signal and enhancement. Sinuses/Orbits: Unremarkable.  Other: None. IMPRESSION: Normal MRI of the brain. Electronically Signed   By: Orvan Falconer M.D.   On: 07/25/2022 20:17   EEG adult  Result Date: 07/25/2022 Charlsie Quest, MD     07/25/2022  5:11 PM Patient Name: Wendy [name] MRN: [id number] Epilepsy Attending: Charlsie Quest Referring Physician/Provider: Alwyn Ren, MD Date: 07/25/2022 Duration: 24.11 mins Patient history: 72 yo woman presents after spell of involuntary movements. EEG to evaluate for seizure Level of alertness: Awake, asleep AEDs during EEG study: None Technical aspects: This EEG study was done with scalp electrodes positioned according to the 10-20 International system of electrode placement. Electrical activity was reviewed with band pass filter of 1-70Hz , sensitivity of 7 uV/mm, display speed of 55mm/sec with a 60Hz  notched filter applied as appropriate. EEG data were recorded continuously and digitally stored.  Video monitoring was available and reviewed as appropriate. Description: The posterior dominant rhythm consists of 8Hz  activity of moderate voltage (25-35 uV) seen predominantly in posterior head regions, symmetric and reactive to eye opening and eye closing. Sleep was characterized by vertex waves, sleep spindles (12 to 14 Hz), maximal frontocentral region.  Hyperventilation and photic stimulation were not performed.   IMPRESSION: This study is within normal limits. No seizures or epileptiform discharges were seen throughout the recording. A normal interictal EEG does not exclude  the diagnosis of epilepsy. Priyanka Annabelle Harman   CT HEAD WO CONTRAST ( )  Result Date: 07/24/2022 CLINICAL DATA:  Trauma. EXAM: CT HEAD WITHOUT CONTRAST CT CERVICAL SPINE WITHOUT CONTRAST TECHNIQUE: Multidetector CT imaging of the head and cervical spine was performed following the standard protocol without intravenous contrast. Multiplanar CT image reconstructions of the cervical spine were also generated. RADIATION DOSE REDUCTION:  This exam was performed according to the departmental dose-optimization program which includes automated exposure control, adjustment of the mA and/or kV according to patient size and/or use of iterative reconstruction technique. COMPARISON:  Head CT dated 09/19/2019. FINDINGS: CT HEAD FINDINGS Brain: Mild age-related atrophy and chronic microvascular ischemic changes. There is no acute intracranial hemorrhage. No mass effect or  midline shift. No extra-axial fluid collection. Vascular: No hyperdense vessel or unexpected calcification. Skull: Normal. Negative for fracture or focal lesion. Sinuses/Orbits: There is mucoperiosteal thickening of paranasal sinuses. No air-fluid level. Mild left mastoid effusions. Other: None CT CERVICAL SPINE FINDINGS Alignment: No acute subluxation. There is straightening of normal cervical lordosis which may be positional or due to muscle spasm. Skull base and vertebrae: No acute fracture.  Osteopenia. Soft tissues and spinal canal: No prevertebral fluid or swelling. No visible canal hematoma. Disc levels: No acute findings. Degenerative changes. C5-C6 ankylosis. Upper chest: Negative. Other: Right thyroid nodule measures 13 mm. This has been evaluated on previous imaging. (ref: J Am Coll Radiol. 2015 Feb;12(2): 143-50). IMPRESSION: 1. No acute intracranial pathology. Mild age-related atrophy and chronic microvascular ischemic changes. 2. No acute/traumatic cervical spine pathology. Electronically Signed   By: Elgie Collard M.D.   On: 07/24/2022 03:35   CT CERVICAL SPINE WO CONTRAST  Result Date: 07/24/2022 CLINICAL DATA:  Trauma. EXAM: CT HEAD WITHOUT CONTRAST CT CERVICAL SPINE WITHOUT CONTRAST TECHNIQUE: Multidetector CT imaging of the head and cervical spine was performed following the standard protocol without intravenous contrast. Multiplanar CT image reconstructions of the cervical spine were also generated. RADIATION DOSE REDUCTION: This exam was performed according to the  departmental dose-optimization program which includes automated exposure control, adjustment of the mA and/or kV according to patient size and/or use of iterative reconstruction technique. COMPARISON:  Head CT dated 09/19/2019. FINDINGS: CT HEAD FINDINGS Brain: Mild age-related atrophy and chronic microvascular ischemic changes. There is no acute intracranial hemorrhage. No mass effect or midline shift. No extra-axial fluid collection. Vascular: No hyperdense vessel or unexpected calcification. Skull: Normal. Negative for fracture or focal lesion. Sinuses/Orbits: There is mucoperiosteal thickening of paranasal sinuses. No air-fluid level. Mild left mastoid effusions. Other: None CT CERVICAL SPINE FINDINGS Alignment: No acute subluxation. There is straightening of normal cervical lordosis which may be positional or due to muscle spasm. Skull base and vertebrae: No acute fracture.  Osteopenia. Soft tissues and spinal canal: No prevertebral fluid or swelling. No visible canal hematoma. Disc levels: No acute findings. Degenerative changes. C5-C6 ankylosis. Upper chest: Negative. Other: Right thyroid nodule measures 13 mm. This has been evaluated on previous imaging. (ref: J Am Coll Radiol. 2015 Feb;12(2): 143-50). IMPRESSION: 1. No acute intracranial pathology. Mild age-related atrophy and chronic microvascular ischemic changes. 2. No acute/traumatic cervical spine pathology. Electronically Signed   By: Elgie Collard M.D.   On: 07/24/2022 03:35   DG Chest 2 View  Result Date: 07/24/2022 CLINICAL DATA:  Cough EXAM: CHEST - 2 VIEW COMPARISON:  02/21/2022 FINDINGS: Lungs are clear. No pneumothorax or pleural effusion. Coronary artery bypass grafting has been performed. Cardiac size within normal limits. Pulmonary vascularity is normal. No acute bone abnormality. IMPRESSION: 1. No active cardiopulmonary disease. Electronically Signed   By: Helyn Numbers M.D.   On: 07/24/2022 01:56   (Echo, Carotid, EGD,  Colonoscopy, ERCP)    Subjective:  Anxious to go home no further spells she is aware she needs to follow-up with neurology and cardiology as an outpatient Discharge Exam: Vitals:   07/26/22 0510 07/26/22 0800  BP: (!) 114/50 (!) 125/52  Pulse: (!) 51 (!) 57  Resp: 13 11  Temp: 97.8 F (36.6 C) 98.1 F (36.7 C)  SpO2:  99%   Vitals:   07/26/22 0024 07/26/22 0444 07/26/22 0510 07/26/22 0800  BP: (!) 127/49  (!) 114/50 (!) 125/52  Pulse: (!) 55  (!) 51 (!) 57  Resp: 13  13 11   Temp: 97.6 F (36.4 C)  97.8 F (36.6 C) 98.1 F (36.7 C)  TempSrc: Oral  Oral Oral  SpO2: 99%   99%  Weight:  58.7 kg    Height:        General: Pt is alert, awake, not in acute distress Cardiovascular: RRR, S1/S2 +, no rubs, no gallops Respiratory: CTA bilaterally, no wheezing, no rhonchi Abdominal: Soft, NT, ND, bowel sounds + Extremities: no edema, no cyanosis    The results of significant diagnostics from this hospitalization (including imaging, microbiology, ancillary and laboratory) are listed below for reference.     Microbiology: Recent Results (from the past 240 hour(s))  SARS Coronavirus 2 by RT PCR (hospital order, performed in [facility] hospital lab) *cepheid single result test* Anterior Nasal Swab     Status: None   Collection Time: 07/24/22  1:55 AM   Specimen: Anterior Nasal Swab  Result Value Ref Range Status   SARS Coronavirus 2 by RT PCR NEGATIVE NEGATIVE Final    Comment: Performed at [facility] Lab, [address] [place], [place] [address]     Labs: BNP (last 3 results) No results for input(s): "BNP" in the last 8760 hours. Basic Metabolic Panel: Recent Labs  Lab 07/24/22 0114 07/24/22 0736 07/24/22 1324 07/25/22 0346 07/26/22 0846  NA 137  --  138 134* 139  K 6.0*  --  5.2* 5.5* 4.9  CL 111  --  112* 106 110  CO2 20*  --  21* 22 20*  GLUCOSE 98  --  111* 92 82  BUN 40*  --  30* 29* 24*  CREATININE 1.15*  --  1.01* 1.27* 0.98  CALCIUM 8.5*  --   7.9* 8.6* 8.4*  MG  --  2.0  --   --   --    Liver Function Tests: Recent Labs  Lab 07/24/22 0114  AST 22  ALT 20  ALKPHOS 50  BILITOT 0.5  PROT 5.9*  ALBUMIN 2.8*   No results for input(s): "LIPASE", "AMYLASE" in the last 168 hours. Recent Labs  Lab 07/24/22 0114  AMMONIA 14   CBC: Recent Labs  Lab 07/24/22 0114  WBC 7.6  NEUTROABS 6.0  HGB 11.8*  HCT 37.9  MCV 97.2  PLT 161   Cardiac Enzymes: Recent Labs  Lab 07/24/22 0736  CKTOTAL 44   BNP: Invalid input(s): "POCBNP" CBG: No results for input(s): "GLUCAP" in the last 168 hours. D-Dimer No results for input(s): "DDIMER" in the last 72 hours. Hgb A1c No results for input(s): "HGBA1C" in the last 72 hours. Lipid Profile No results for input(s): "CHOL", "HDL", "LDLCALC", "TRIG", "CHOLHDL", "LDLDIRECT" in the last 72 hours. Thyroid function studies Recent Labs    07/24/22 0736  TSH 0.802   Anemia work up No results for input(s): "VITAMINB12", "FOLATE", "FERRITIN", "TIBC", "IRON", "RETICCTPCT" in the last 72 hours. Urinalysis    Component Value Date/Time   COLORURINE YELLOW 07/24/2022 0430   APPEARANCEUR CLEAR 07/24/2022 0430   LABSPEC 1.014 07/24/2022 0430   PHURINE 5.0 07/24/2022 0430   GLUCOSEU 150 (A) 07/24/2022 0430   HGBUR NEGATIVE 07/24/2022 0430   BILIRUBINUR NEGATIVE 07/24/2022 0430   KETONESUR NEGATIVE 07/24/2022 0430   PROTEINUR NEGATIVE 07/24/2022 0430   UROBILINOGEN 1.0 09/24/2013 1951   NITRITE NEGATIVE 07/24/2022 0430   LEUKOCYTESUR NEGATIVE 07/24/2022 0430   Sepsis Labs Recent Labs  Lab 07/24/22 0114  WBC 7.6   Microbiology Recent Results (from the past  240 hour(s))  SARS Coronavirus 2 by RT PCR (hospital order, performed in [facility] hospital lab) *cepheid single result test* Anterior Nasal Swab     Status: None   Collection Time: 07/24/22  1:55 AM   Specimen: Anterior Nasal Swab  Result Value Ref Range Status   SARS Coronavirus 2 by RT PCR NEGATIVE NEGATIVE Final     Comment: Performed at [facility], [address], [place], [place] [address]     Time coordinating discharge: 30 minutes  SIGNED: Alwyn Ren, MD  [facility] 07/26/2022, 1:47 PM

## 2022-07-26 NOTE — Progress Notes (Addendum)
Rounding Note    Patient Name: Wendy [name] Date of Encounter: 07/26/2022   [facility] Cardiologist: Kristeen Miss, MD   Subjective   Patient states she is doing ok, slept well last night, felt that she can "feel my heart beat", denied chest pain, SOB, dizziness, syncope. Jerky movement has not recurred. She saw neuro and did some test yesterday.   Inpatient Medications    Scheduled Meds:  apixaban  5 mg Oral BID   atorvastatin  80 mg Oral Daily   ezetimibe  10 mg Oral Daily   hydrocortisone  5 mg Oral BID   isosorbide mononitrate  30 mg Oral Daily   levothyroxine  50 mcg Oral QAC breakfast   pantoprazole  40 mg Oral Daily   pramipexole  0.25 mg Oral QHS   ranolazine  1,000 mg Oral BID   sertraline  150 mg Oral QHS   sodium zirconium cyclosilicate  10 g Oral Once   Continuous Infusions:  sodium chloride 75 mL/hr at 07/26/22 0715   PRN Meds: acetaminophen **OR** acetaminophen, albuterol, ALPRAZolam, oxyCODONE   Vital Signs    Vitals:   07/25/22 2039 07/26/22 0024 07/26/22 0444 07/26/22 0510  BP: (!) 118/45 (!) 127/49  (!) 114/50  Pulse: (!) 56 (!) 55  (!) 51  Resp: 17 13  13   Temp: 97.8 F (36.6 C) 97.6 F (36.4 C)  97.8 F (36.6 C)  TempSrc: Oral Oral  Oral  SpO2: 99% 99%    Weight:   58.7 kg   Height:        Intake/Output Summary (Last 24 hours) at 07/26/2022 0750 Last data filed at 07/26/2022 0500 Gross per 24 hour  Intake 1233.68 ml  Output 250 ml  Net 983.68 ml       07/26/2022    4:44 AM 07/25/2022    4:15 AM 07/24/2022    6:12 PM  Last 3 Weights  Weight (lbs) 129 lb 4.8 oz 126 lb 6.4 oz 128 lb 1.4 oz  Weight (kg) 58.65 kg 57.335 kg 58.1 kg      Telemetry    Sinus bradycardia 60s, occasional PVCs and PACs, no high-grade AV block noted, early morning 2-3am  sinus bradycardia down to 35s noted- Personally Reviewed  ECG    N/A today - Personally Reviewed  Physical Exam   GEN: No acute distress.   Neck: No JVD Cardiac: RRR,  no murmurs, rubs, or gallops.  Respiratory: Clear to auscultation bilaterally. On room air. Speaks full sentence  GI: Soft, nontender, non-distended  MS: No leg edema; No deformity. Neuro:  Nonfocal  Psych: Normal affect   Labs    High Sensitivity Troponin:   Recent Labs  Lab 07/24/22 0114  TROPONINIHS 12     Chemistry Recent Labs  Lab 07/24/22 0114 07/24/22 0736 07/24/22 1324 07/25/22 0346  NA 137  --  138 134*  K 6.0*  --  5.2* 5.5*  CL 111  --  112* 106  CO2 20*  --  21* 22  GLUCOSE 98  --  111* 92  BUN 40*  --  30* 29*  CREATININE 1.15*  --  1.01* 1.27*  CALCIUM 8.5*  --  7.9* 8.6*  MG  --  2.0  --   --   PROT 5.9*  --   --   --   ALBUMIN 2.8*  --   --   --   AST 22  --   --   --  ALT 20  --   --   --   ALKPHOS 50  --   --   --   BILITOT 0.5  --   --   --   GFRNONAA 51*  --  60* 45*  ANIONGAP 6  --  5 6    Lipids No results for input(s): "CHOL", "TRIG", "HDL", "LABVLDL", "LDLCALC", "CHOLHDL" in the last 168 hours.  Hematology Recent Labs  Lab 07/24/22 0114  WBC 7.6  RBC 3.90  HGB 11.8*  HCT 37.9  MCV 97.2  MCH 30.3  MCHC 31.1  RDW 13.7  PLT 161   Thyroid  Recent Labs  Lab 07/24/22 0736  TSH 0.802    BNPNo results for input(s): "BNP", "PROBNP" in the last 168 hours.  DDimer No results for input(s): "DDIMER" in the last 168 hours.   Radiology    MR BRAIN W WO CONTRAST  Result Date: 07/25/2022 CLINICAL DATA:  Seizure, new onset, no history of trauma. EXAM: MRI HEAD WITHOUT AND WITH CONTRAST TECHNIQUE: Multiplanar, multiecho pulse sequences of the brain and surrounding structures were obtained without and with intravenous contrast. CONTRAST:  5mL GADAVIST GADOBUTROL 1 MMOL/ML IV SOLN COMPARISON:  MRI brain 08/15/2016.  Head CT 07/24/2022. FINDINGS: Brain: No acute infarct or hemorrhage. No hydrocephalus or extra-axial collection. No foci of abnormal susceptibility. No mass or abnormal enhancement. Symmetric size and signal of the hippocampi. No  evidence of cortical dysgenesis. Vascular: Normal flow voids and vessel enhancement. Skull and upper cervical spine: Normal marrow signal and enhancement. Sinuses/Orbits: Unremarkable. Other: None. IMPRESSION: Normal MRI of the brain. Electronically Signed   By: Orvan Falconer M.D.   On: 07/25/2022 20:17   EEG adult  Result Date: 07/25/2022 Charlsie Quest, MD     07/25/2022  5:11 PM Patient Name: DAYSY BINGLE MRN: [id number] Epilepsy Attending: Charlsie Quest Referring Physician/Provider: Alwyn Ren, MD Date: 07/25/2022 Duration: 24.11 mins Patient history: 72 yo woman presents after spell of involuntary movements. EEG to evaluate for seizure Level of alertness: Awake, asleep AEDs during EEG study: None Technical aspects: This EEG study was done with scalp electrodes positioned according to the 10-20 International system of electrode placement. Electrical activity was reviewed with band pass filter of 1-70Hz , sensitivity of 7 uV/mm, display speed of 11mm/sec with a 60Hz  notched filter applied as appropriate. EEG data were recorded continuously and digitally stored.  Video monitoring was available and reviewed as appropriate. Description: The posterior dominant rhythm consists of 8Hz  activity of moderate voltage (25-35 uV) seen predominantly in posterior head regions, symmetric and reactive to eye opening and eye closing. Sleep was characterized by vertex waves, sleep spindles (12 to 14 Hz), maximal frontocentral region.  Hyperventilation and photic stimulation were not performed.   IMPRESSION: This study is within normal limits. No seizures or epileptiform discharges were seen throughout the recording. A normal interictal EEG does not exclude  the diagnosis of epilepsy. Charlsie Quest    Cardiac Studies   Left Cardiac Catheterization 06/27/2019: 1. Severe multivessel CAD with total occlusion of the RCA, first diagonal, and first OM branches 2. S/P CABG with known occlusion of all bypass  grafts 3. Moderate calcific proximal LAD stenosis unchanged from prior cath study 4. Distal RCA and LCx territories collateralized by branches of the LAD   Stable coronary anatomy - continue medical therapy/supportive measures   Diagnostic Dominance: Right    _______________   Echocardiogram 02/03/2022: Impressions: 1. Left ventricular ejection fraction, by estimation, is 55  to 60%. The  left ventricle has normal function. The left ventricle has no regional  wall motion abnormalities. Left ventricular diastolic parameters are  indeterminate.   2. Right ventricular systolic function is low normal. The right  ventricular size is normal. Tricuspid regurgitation signal is inadequate  for assessing PA pressure.   3. The mitral valve is normal in structure. Trivial mitral valve  regurgitation. No evidence of mitral stenosis.   4. The aortic valve is tricuspid. Aortic valve regurgitation is not  visualized. Aortic valve sclerosis/calcification is present, without any  evidence of aortic stenosis.   5. The inferior vena cava is normal in size with greater than 50%  respiratory variability, suggesting right atrial pressure of 3 mmHg.     Patient Profile     72 y.o. female  with a history of CAD with NSTEMI and 1998 s/p CABG x3  (LIMA-LAD, SVG-OM2, SVG-RCA) with subsequent PCI in 2013 and DES to SVG to RCA in 01/2015, chronic combined CHF with EF as low as 20-25% in 06/2019 but improved to 55-60% on last Echo in 01/2022, paroxysmal atrial fibrillation on amiodarone and Eliquis, PAD s/p prior left to right fem-fem bypass and more recently aortic bifemoral bypass and 06/2019, mild bilateral carotid stenosis, COPD, hypertension, hyperlipidemia, prior DVT, obstructive sleep apnea on CPAP, and prior polysubstance abuse. Cardiology is consulted since 07/24/22 for sinus bradycardia and second degree Mobitz 1 AVB.   Assessment & Plan    Sinus bradycardia Second-degree AV block, Mobitz 1 - presented  with jerking episodes, no cardiac symptoms, found to have hypotension, mild dehydration, low AM cortisol, hyperkalemia >6, EKG and telemetry showed sinus bradycardia with intermittent Mobitz 1 - Trop negative; TSH wnL; Echo 11/23 with LVEF 55-60% - Not on AVN blocking agents PTA, would avoid  - Hypotension resolved with IVF, suspect due to mild dehydration + multiple GDMT for CHF, not due to sinus bradycardia  - No indication for PPM at this time, may discharge home with 2 week Zio   Hypotension, resolved  - BP improved with IVF, holding GDMT and amiodarone, and steroid use; BP 108/45 - 134/54 over the past 24 hours  - PTA Entresto 49/51, spironolactone 25mg ,  PRN Lasix has been held, remains on Imdur 30mg  - work up for adrenal insuffiencey per primary team   Systolic heart failure with recovered EF - LVEF as low as 20-25% in 06/2019 after PEA. However, EF has since normalized. Last Echo in 01/2022 showed LVEF of 55-60% with no regional wall motion abnormalities, low normal RV function, and no significant valvular disease.  - Clinically remains euvolemic since admission  - GDMT: PTA Entresto 49/51, spironolactone 25mg ,  PRN Lasix has been held, remains on Imdur 30mg   CAD  - S/p CABG x3 in 1998 in setting of NSTEMI with subsequent PCI in 2013 and then again in 2016. Last LHC in 06/2019 showed stable 3 vessel CAD with know occlusions of all grafts. Continued medical therapy was recommended.  - No angina, EKG and Hs trop negative this admission - Medical therapy: not on ASA due to Eliquis use, continue PTA lipitor 80mg , zetia 10mg ,  Imdur 30mg , and ranexa 1000mg  BID   Paroxysmal Atrial Fibrillation  - PTA amiodarone held due to bradycardia and Mobitz 1 - Remains in sinus  - Continue PTA Eliquis   Hyperkalemia - remains elevated 5.5, consider Lokelma - repeat BMP today   Jerky movement of whole body  -Consider dyskinesia from medication versus chorea workup, neuro referral  Dehydration   PAD Anxiety with depression COPD OSA THC use  - per primary team     For questions or updates, please contact [facility] Please consult [email or web address] for contact info under        Signed, Cyndi Bender, NP  07/26/2022, 7:50 AM    Personally seen and examined. Agree with APP above with the following comments and with corrections made above: No events overnight. Patient notes no cardiac symptoms. She notes a lot of stress for the past 3.5 years since her aunt passed away. BP has improved. Patient is euvolemic; has a prominent V wave and a systolic murmur on my exam but is euvolemic.   Labs notable for slight increase in K and creatinine today Te;e: SR to sinus bradycardia with PACs and PVCs  Would recommend  -Eliquis, zetia, and statin; imdur and ranolazine continued without change from admission - lasix will continue at same dose as PRN - entresto has been held; we will arrange early f/u for potential restart at low dose  Riley Lam, MD FASE [facility] Cardiologist [facility]  [address], [address] [place], [place] [address] [phone]  10:18 AM

## 2022-07-26 NOTE — Progress Notes (Signed)
Notified by lab the ACTH-P lab test will need to recollected. Dr Ashley Royalty states no need to make additional phone calls, this will have to be managed outpatient as patient has been dscharged.

## 2022-07-26 NOTE — Progress Notes (Addendum)
Neurology progress note  S: No further spells of involuntary movements since admission  O:  Vitals:   07/26/22 0510 07/26/22 0800  BP: (!) 114/50 (!) 125/52  Pulse: (!) 51 (!) 57  Resp: 13 11  Temp: 97.8 F (36.6 C) 98.1 F (36.7 C)  SpO2:  99%    Gen: patient lying in bed, NAD CV: extremities appear well-perfused Resp: normal WOB  Neurologic exam MS: alert, oriented x4, follows commands Speech: no dysarthria, no aphasia CN: PERRL, VFF, EOMI, sensation intact, face symmetric, hearing intact to voice Motor: 5/5 strength throughout Sensory: SILT Reflexes: 2+ symm with toes down bilat Coordination: FNF intact bilat Gait: deferred  Data  MRI brain wwo - normal (personal review)  rEEG - WNL  A/P: This is a 72 yo woman with hx anxiety, asthma, COPD, chronic combined systolic and diastolic heart failure, CAD, depression, hyperlipidemia, history of DVT x 3 as well as pAF on eliquis who presents after spell of involuntary movements.  She has had similar but milder spells of non-rhythmic asynchronous movements over the past 4 years without clear trigger and has not been previously worked up. These typically last for about one hour and can wake her up from sleep but also start when she is awake. Frontal lobe seizures are on the differential; these may present with movements that are atypical from usual and can have variable associated cognitive deficits although the duration would be atypical. MRI brain wwo and rEEG this admission were unremarkable. No further inpatient neurologic workup indicated. I will refer her to outpatient neurology for f/u. She does live with her nephew and I asked her to ask her nephew to take a video of her next spell with his camera to show the outpatient neurologist.  It would be very helpful to see what the spells actually look like.   - No further inpatient neurologic workup indicated - I will refer to outpatient neuro for f/u - Patient will request nephew  whom she lives with to video any further spells on his phone to show outpatient neurologist - Wellbutrin discontinued (decreases seizure threshold) - pt amenable to this - No indication for AEDs at this time  Neurology to s/o, please reconsult if new neurologic concerns arise.  Bing Neighbors, MD [facility] [phone]  If 7pm- 7am, please page neurology on call as listed in AMION.

## 2022-07-26 NOTE — Progress Notes (Signed)
Follow up arranged with Dr Elease Hashimoto on 08/24/22, 2 week Zio ordered, staff will contact the patient.

## 2022-07-27 ENCOUNTER — Telehealth: Payer: Self-pay

## 2022-07-27 NOTE — Transitions of Care (Post Inpatient/ED Visit) (Signed)
   07/27/2022  Name: Wendy [name] MRN: [id number] DOB: 05/04/1950  Today's TOC FU Call Status: Today's TOC FU Call Status:: Unsuccessul Call (1st Attempt) Unsuccessful Call (1st Attempt) Date: 07/27/22  Attempted to reach the patient regarding the most recent Inpatient/ED visit.  Follow Up Plan: Additional outreach attempts will be made to reach the patient to complete the Transitions of Care (Post Inpatient/ED visit) call.   Signature Sydne Krahl RMA

## 2022-07-29 ENCOUNTER — Telehealth: Payer: Self-pay

## 2022-07-29 NOTE — Transitions of Care (Post Inpatient/ED Visit) (Signed)
   07/29/2022  Name: Wendy [name] MRN: [id number] DOB: Dec 23, 1950  Today's TOC FU Call Status: Today's TOC FU Call Status:: Unsuccessful Call (2nd Attempt) Unsuccessful Call (2nd Attempt) Date: 07/29/22  Attempted to reach the patient regarding the most recent Inpatient/ED visit.  Follow Up Plan: Additional outreach attempts will be made to reach the patient to complete the Transitions of Care (Post Inpatient/ED visit) call.   Signature Ante Arredondo RMA

## 2022-08-02 ENCOUNTER — Telehealth: Payer: Self-pay

## 2022-08-02 NOTE — Transitions of Care (Post Inpatient/ED Visit) (Signed)
   08/02/2022  Name: Wendy [name] MRN: [id number] DOB: 01/07/1951  Today's TOC FU Call Status: Today's TOC FU Call Status:: Unsuccessful Call (3rd Attempt) Unsuccessful Call (3rd Attempt) Date: 08/02/22  Attempted to reach the patient regarding the most recent Inpatient/ED visit.  Follow Up Plan: No further outreach attempts will be made at this time. We have been unable to contact the patient.  Signature Vanna Shavers RMA

## 2022-08-23 NOTE — Progress Notes (Deleted)
Cardiology Office Note   Date:  08/23/2022   ID:  Wendy [name], DOB 1951-01-09, MRN [id number]  PCP:  Jac Canavan, PA-C  Cardiologist:   Kristeen Miss, MD   No chief complaint on file.  Problem List 1. Unstable angina (HCC) - history of CAD s/p CABG SVG-OM, LIMA-LAD, SVG-RCA in 1998, PCI in 2013. She underwent cardiac catheterization here in 07/2013 which showed an atretic LIMA-LAD with no significant disease in the native LAD, patent SVG-OM with patent stent and patent SVG-distRCA.   S/p PCI of native RCA ( within the stent )   2. Paroxysmal Atrial Fibrillation  CHADS2VASC = 4   ( female, age 66, CAD,HTN ) 3. Hypertension -    4. HLD (hyperlipidemia)  5. COPD (chronic obstructive pulmonary disease) (HCC)  Dec. 2, 2016:  Wendy [name] is a 72 y.o. female who presents for follow up of recent hospitalization for chest pain, abnormal blood pressure and PCI of the right coronary artery. Since the discharge she's not been feeling well.   After the stent, she felt very well for a week or so . Then she started Eliquis after she got home. Has felt poorly since that  No bleeding in stool. Thinks the Eliquis is causing her headaches, diarrhea, feels poorly all over.   Symptoms sound like flu like to me .  Wants to sleep all the time  Has not been to the medical doctor .  Has been very vatigued - complete lack of energy for the past week.   No CP , breathing is ok  No focal neurological changes.   August 13, 2015:  Wendy [name] is in atrial fib today .   Is having some generalized weakness and dyspnea.  Feels poorly all the time.   Is anemic,  Has been taking all of her meds But did not take them this am  Does not necessarily feel worse when she is in atrial fib   .she feesl poorly all the time ! Is going for colonoscopy soon   02/11/2016:  Is in atrial fib today  Just started today .   Has not seen the A-Fib clinic  needs to have hip replacement  surgery and back  injections or surgery   Oct. 1, 2018  Wendy [name] is seen today for follow up visit Had a recent episode of CP , resolved with 1 SL NTG .  Was admitted in June with NSTEMI - + for cocaine at that time  Still has occasional episodes of CP, takes NTG wit relief. Exercising  1-2 times a week,  Is on Ranexa 1000 PO BID and Imdur 60 mg a day  Smokes marijuana  Frequently  Avoids salty foods for the most part.   July 03, 2017:  Wendy [name] was last seen I 01-12-17 Her daughter died a month ago of respiratory complications - age 54.  Occasional episodes of CP ,  Relieved  Is not smoking  Takes NTG regularly  - perhaps 2-3 times a day , sometimes none .  She is having lots of back problems.  Her surgeon has suggested may be a back stimulator.  And wanted by input on whether or not she would be a candidate for this.  I think that she would be at perhaps moderate risk for cardiovascular complications.  Her angina pattern is very stable and she is not having any worsening angina so I think that the risks are acceptable at this point.  January 11, 2022: Wendy [name] is seen for follow-up of her coronary artery disease, congestive heart failure, peripheral arterial disease, atrial fibrillation, polysubstance abuse  She was seen 1 year ago in our office by Gillian Shields, NP.  Does not feel well at all   General lack of energy Diarrhea Been in bed for days   Her last echocardiogram is from July, 2021.  Revealed mildly depressed left ventricular systolic function with ejection fraction of 45 to 50%.  She has grade 1 diastolic dysfunction. Trivial mitral regurgitation  Has some bilateral jaw pain , Occasional angina like cp  Takes NTG with relief.   Last heart catheterization was in May, 2021.  She has occluded grafts.  She has an occluded right coronary artery, obtuse marginal and distal circumflex artery.  The LAD has a 60% stenosis.  There were no interventional targets. Very little exercise   Poor  appetite .   No abdominal pain with eating .    Has been seen in the advanced CHF clinic by [facility]    August 24, 2022  Wendy [name] is seen for follow up of her CAD , grade I DD, trivial MR   Past Medical History:  Diagnosis Date   Anemia    Anginal pain (HCC)    jaw pain 07/2016, s/p 07/15/16 nuclear stress test   Anxiety    Arthritis    "lower back" (08/24/2017)   Asthma    Chornic Obstructive Pulmonary Disease    Chronic back pain    Chronic combined systolic and diastolic CHF    EF 5/18: 60-65 // EF 4/21: 55-60 // EF 4/21: 20-25   Chronic leg pain    Chronic lower back pain    Coronary Artery Disease    a. s/p NSTEMI >> CABG 1998 // s/p PCI 2013 // LHC 01/2015: DES to SVG-RCA.// Cath 05/2019: all grafts occluded; mod non-obs LAD disease w L-L and L-R collats - med Rx // post Ao-Bifem bypass MI c/b PEA, CGS requiring Impella >> anatomy unchanged - Med Rx   Depression    Fall 08/2016   Gastroesophageal reflux disease    Glucose intolerance    Headache    "a few/month" (08/24/2017)   HLD (hyperlipidemia)    Hx of DVT X 3   RLE   Hypertension    Myocardial infarction (HCC) 1999   OSA on CPAP    Paroxysmal atrial fibrillation    a. 09/2013 post-op b. 01/2015 // Apixaban // recurrent AFib post MI after Ao-Bifem in 06/2019 >> Amio Rx   Peripheral artery disease    s/p L fem-fem bypass // s/p Ao-Bifem bypass in 06/2019   Peripheral neuropathy    Pneumonia    Shortness of breath    when walking   Wears glasses     Past Surgical History:  Procedure Laterality Date   ABDOMINAL AORTOGRAM W/LOWER EXTREMITY Bilateral 04/29/2019   Procedure: ABDOMINAL AORTOGRAM W/LOWER EXTREMITY;  Surgeon: Maeola Harman, MD;  Location: [facility] INVASIVE CV LAB;  Service: Cardiovascular;  Laterality: Bilateral;   ANTERIOR CERVICAL DECOMP/DISCECTOMY FUSION  1991   AORTA - BILATERAL FEMORAL ARTERY BYPASS GRAFT N/A 06/18/2019   Procedure: AORTA BIFEMORAL BYPASS GRAFT;  Surgeon: Maeola Harman, MD;  Location: [facility] OR;  Service: Vascular;  Laterality: N/A;   ARTERY REPAIR Left 06/30/2019   Procedure: LEFT BRACHIAL ARTERY REPAIR, EVACUATION OF HEMATOMA;  Surgeon: Nada Libman, MD;  Location: [facility] OR;  Service: Vascular;  Laterality: Left;   BACK SURGERY  CARDIAC CATHETERIZATION N/A 01/22/2015   Procedure: Left Heart Cath and Coronary Angiography;  Surgeon: Lennette Bihari, MD;  Location: [facility] INVASIVE CV LAB;  Service: Cardiovascular;  Laterality: N/A;   CARDIAC CATHETERIZATION N/A 01/22/2015   Procedure: Coronary Stent Intervention;  Surgeon: Lennette Bihari, MD;  Location: [facility] INVASIVE CV LAB;  Service: Cardiovascular;  Laterality: N/A;  3.0x12 xience prox SVG to RCA   CARDIAC CATHETERIZATION  03/09/2000   hx/notes 03/20/2000   CHOLECYSTECTOMY N/A 09/16/2013   Procedure: LAPAROSCOPIC CHOLECYSTECTOMY CONVERTED TO OPEN CHOLECYSTECTOMY WITH CHOLANGIOGRAM;  Surgeon: Shelly Rubenstein, MD;  Location: [facility] OR;  Service: General;  Laterality: N/A;   CORONARY ANGIOGRAPHY N/A 06/27/2019   Procedure: CORONARY ANGIOGRAPHY (CATH LAB);  Surgeon: Tonny Bollman, MD;  Location: [facility] INVASIVE CV LAB;  Service: Cardiovascular;  Laterality: N/A;   CORONARY ANGIOPLASTY WITH STENT PLACEMENT  11/2011, 02/2012, 01/2015   DES to SVG-RCA both times, In [place], [place], Dr. Unice Cobble   CORONARY ARTERY BYPASS GRAFT  1998   LIMA-LAD, SVG-OM, SVG-RCA   ERCP N/A 09/19/2013   Procedure: ENDOSCOPIC RETROGRADE CHOLANGIOPANCREATOGRAPHY (ERCP);  Surgeon: Louis Meckel, MD;  Location: [facility] ENDOSCOPY;  Service: Endoscopy;  Laterality: N/A;   FINGER SURGERY Right    ring finger "fused"   FRACTURE SURGERY     IRRIGATION AND DEBRIDEMENT ABSCESS Right 10/17/2013   Procedure: INCISION AND DRAINAGE RIGHT SUBCOSTAL WOUND;  Surgeon: Kandis Cocking, MD;  Location: [facility] ORS;  Service: General;  Laterality: Right;   JOINT REPLACEMENT     KNEE ARTHROSCOPY Right 1998   LEFT HEART CATH AND CORS/GRAFTS ANGIOGRAPHY N/A 07/25/2016    Procedure: Left Heart Cath and Cors/Grafts Angiography;  Surgeon: Corky Crafts, MD;  Location: [facility] INVASIVE CV LAB;  Service: Cardiovascular;  Laterality: N/A;   LEFT HEART CATH AND CORS/GRAFTS ANGIOGRAPHY N/A 06/05/2019   Procedure: LEFT HEART CATH AND CORS/GRAFTS ANGIOGRAPHY;  Surgeon: Kathleene Hazel, MD;  Location: [facility] INVASIVE CV LAB;  Service: Cardiovascular;  Laterality: N/A;   LEFT HEART CATHETERIZATION WITH CORONARY/GRAFT ANGIOGRAM N/A 07/19/2013   Procedure: LEFT HEART CATHETERIZATION WITH Isabel Caprice;  Surgeon: Marykay Lex, MD;  Location: [facility] CATH LAB;  Service: Cardiovascular;  Laterality: N/A;   MASS EXCISION Right 06/25/2014   Procedure: EXCISION BUTTOCK MASS ;  Surgeon: Abigail Miyamoto, MD;  Location: [facility];  Service: General;  Laterality: Right;   ORIF HUMERUS FRACTURE Left 08/27/2013   Procedure: OPEN REDUCTION INTERNAL FIXATION (ORIF) LEFT HUMERUS ;  Surgeon: Budd Palmer, MD;  Location: [facility] OR;  Service: Orthopedics;  Laterality: Left;   PLACEMENT OF IMPELLA LEFT VENTRICULAR ASSIST DEVICE N/A 06/24/2019   Procedure: PLACEMENT OF IMPELLA LEFT VENTRICULAR ASSIST DEVICE;  Surgeon: Linden Dolin, MD;  Location: [facility] OR;  Service: Open Heart Surgery;  Laterality: N/A;   POSTERIOR FUSION CERVICAL SPINE  1992   REMOVAL OF IMPELLA LEFT VENTRICULAR ASSIST DEVICE Right 06/28/2019   Procedure: REMOVAL OF IMPELLA LEFT VENTRICULAR ASSIST DEVICE;  Surgeon: Linden Dolin, MD;  Location: [facility] OR;  Service: Open Heart Surgery;  Laterality: Right;   THROMBECTOMY / EMBOLECTOMY FEMORAL ARTERY Right    hx/notes 03/20/2000   TOTAL HIP ARTHROPLASTY Right 07/19/2016   Procedure: TOTAL HIP ARTHROPLASTY ANTERIOR APPROACH;  Surgeon: Sheral Apley, MD;  Location: [facility] OR;  Service: Orthopedics;  Laterality: Right;   TOTAL HIP REVISION Right 08/22/2017   Procedure: TOTAL HIP REVISION;  Surgeon: Sheral Apley, MD;  Location: [facility] OR;  Service: Orthopedics;   Laterality:  Right;     Current Outpatient Medications  Medication Sig Dispense Refill   acetaminophen (TYLENOL) 500 MG tablet Take 500-1,000 mg by mouth every 6 (six) hours as needed (for pain.).     albuterol (PROVENTIL HFA;VENTOLIN HFA) 108 (90 Base) MCG/ACT inhaler Inhale 2 puffs into the lungs every 6 (six) hours as needed for wheezing or shortness of breath. 1 each 1   albuterol (PROVENTIL) (2.5 MG/3ML) 0.083% nebulizer solution Inhale 2.5 mg into the lungs 3 (three) times daily as needed for shortness of breath or wheezing.     ALPRAZolam (XANAX) 0.5 MG tablet Take 0.5 mg by mouth 3 (three) times daily as needed for anxiety.     apixaban (ELIQUIS) 5 MG TABS tablet Take 1 tablet (5 mg total) by mouth 2 (two) times daily. 180 tablet 3   atorvastatin (LIPITOR) 80 MG tablet Take 1 tablet (80 mg total) by mouth daily. 90 tablet 3   ezetimibe (ZETIA) 10 MG tablet Take 10 mg by mouth daily.     Ferrous Sulfate Dried (SLOW RELEASE IRON) 45 MG TBCR Take by mouth.     fluticasone-salmeterol (ADVAIR) 250-50 MCG/ACT AEPB Inhale 1 puff into the lungs 2 (two) times daily.     furosemide (LASIX) 20 MG tablet Take 1 tablet (20 mg total) by mouth as needed for fluid or edema. 30 tablet 1   hydrocortisone (CORTEF) 5 MG tablet Take 1 tablet (5 mg total) by mouth daily. 15 tablet 0   isosorbide mononitrate (IMDUR) 30 MG 24 hr tablet Take 1 tablet (30 mg total) by mouth daily. 90 tablet 3   levothyroxine (SYNTHROID) 50 MCG tablet Take 50 mcg by mouth daily before breakfast.     nitroGLYCERIN (NITROSTAT) 0.4 MG SL tablet Place 1 tablet (0.4 mg total) under the tongue every 5 (five) minutes as needed for chest pain. 25 tablet 2   oxyCODONE 10 MG TABS Take 1 tablet (10 mg total) by mouth 3 (three) times daily. 30 tablet 0   pantoprazole (PROTONIX) 40 MG tablet Take 1 tablet (40 mg total) by mouth daily. 90 tablet 3   pramipexole (MIRAPEX) 0.25 MG tablet Take 0.25 mg by mouth at bedtime.      promethazine  (PHENERGAN) 12.5 MG tablet TAKE ONE TABLET BY MOUTH EVERY 6 HOURS AS NEEDED FOR NAUSEA AND VOMITING (Patient taking differently: Take 12.5 mg by mouth as needed for vomiting or nausea.) 30 tablet 3   ranolazine (RANEXA) 1000 MG SR tablet Take 1 tablet (1,000 mg total) by mouth 2 (two) times daily. 180 tablet 3   sertraline (ZOLOFT) 100 MG tablet Take 150 mg by mouth at bedtime.      Vitamin D, Ergocalciferol, (DRISDOL) 1.25 MG (50000 UNIT) CAPS capsule Take 50,000 Units by mouth every 7 (seven) days.     Current Facility-Administered Medications  Medication Dose Route Frequency Provider Last Rate Last Admin   ferrous sulfate tablet 325 mg  325 mg Oral BID WC Hilarie Fredrickson, MD        Allergies:   Benadryl [diphenhydramine], Penicillins, Adhesive [tape], Amoxicillin, Hydrocodone-acetaminophen, Cyclobenzaprine, and Hydrocodone    Social History:  The patient  reports that she quit smoking about 24 years ago. Her smoking use included cigarettes. She has a 17.00 pack-year smoking history. She has never used smokeless tobacco. She reports that she does not currently use alcohol. She reports current drug use. Drug: Marijuana.   Family History:  The patient's family history includes Clotting disorder in her father; Heart  disease in her brother and brother; Lung cancer in her mother and sister.    ROS:   Noted in current history, otherwise systems are negative.   Physical Exam: There were no vitals taken for this visit.  No BP recorded.  {Refresh Note OR Click here to enter BP  :1}***    GEN:  Well nourished, well developed in no acute distress HEENT: Normal NECK: No JVD; No carotid bruits LYMPHATICS: No lymphadenopathy CARDIAC: RRR ***, no murmurs, rubs, gallops RESPIRATORY:  Clear to auscultation without rales, wheezing or rhonchi  ABDOMEN: Soft, non-tender, non-distended MUSCULOSKELETAL:  No edema; No deformity  SKIN: Warm and dry NEUROLOGIC:  Alert and oriented x 3    EKG:     Recent Labs: 02/15/2022: NT-Pro BNP 523 07/24/2022: ALT 20; Hemoglobin 11.8; Magnesium 2.0; Platelets 161; TSH 0.802 07/26/2022: BUN 24; Creatinine, Ser 0.98; Potassium 4.9; Sodium 139    Lipid Panel    Component Value Date/Time   CHOL 94 (L) 01/11/2022 1546   TRIG 105 01/11/2022 1546   HDL 41 01/11/2022 1546   CHOLHDL 2.3 01/11/2022 1546   CHOLHDL 4.1 08/13/2015 0939   VLDL 48 (H) 08/13/2015 0939   LDLCALC 33 01/11/2022 1546      Wt Readings from Last 3 Encounters:  07/26/22 129 lb 4.8 oz (58.7 kg)  04/12/22 118 lb (53.5 kg)  02/15/22 123 lb 12.8 oz (56.2 kg)      Other studies Reviewed: Additional studies/ records that were reviewed today include: . Review of the above records demonstrates:    ASSESSMENT AND PLAN:  1. Coronary artery disease:  Cath in May, 2018 shows Attretic LIMA to LAD, occluded SVG to OM, occluded SVG to PDA.      2. Paroxysmal atrial fibrillation:     3.   Chronic combined CHF:     Current medicines are reviewed at length with the patient today.  The patient does not have concerns regarding medicines.  The following changes have been made:  no change  Labs/ tests ordered today include:   No orders of the defined types were placed in this encounter.   Disposition:      Kristeen Miss, MD  08/23/2022 4:54 PM    [facility] [address] [place], [place], [place]  [address] Phone: [phone]; Fax: [phone]    rgical standpoint

## 2022-08-24 ENCOUNTER — Ambulatory Visit: Payer: 59 | Attending: Cardiovascular Disease | Admitting: Cardiovascular Disease

## 2022-08-24 ENCOUNTER — Encounter: Payer: Self-pay | Admitting: Cardiovascular Disease

## 2022-11-08 ENCOUNTER — Ambulatory Visit (INDEPENDENT_AMBULATORY_CARE_PROVIDER_SITE_OTHER): Payer: 59

## 2022-11-08 DIAGNOSIS — Z0000 Encounter for general adult medical examination without abnormal findings: Secondary | ICD-10-CM | POA: Diagnosis not present

## 2022-11-08 NOTE — Patient Instructions (Signed)
Wendy [name] , Thank you for taking time to come for your Medicare Wellness Visit. I appreciate your ongoing commitment to your health goals. Please review the following plan we discussed and let me know if I can assist you in the future.   Referrals/Orders/Follow-Ups/Clinician Recommendations: none  This is a list of the screening recommended for you and due dates:  Health Maintenance  Topic Date Due   Hepatitis C Screening  Never done   DEXA scan (bone density measurement)  Never done   Zoster (Shingles) Vaccine (2 of 2) 11/06/2018   COVID-19 Vaccine (3 - Pfizer risk series) 07/02/2019   Mammogram  10/19/2019   Flu Shot  10/06/2022   Medicare Annual Wellness Visit  11/08/2023   Colon Cancer Screening  10/19/2025   DTaP/Tdap/Td vaccine (2 - Td or Tdap) 10/27/2027   Pneumonia Vaccine  Completed   HPV Vaccine  Aged Out    Advanced directives: (ACP Link)Information on Advanced Care Planning can be found at [facility] Advance Health Care Directives Advance Health Care Directives ([email or web address]   Next Medicare Annual Wellness Visit scheduled for next year: Yes  Insert Preventive Care attachment Insert FALL PREVENTION attachment if needed

## 2022-11-08 NOTE — Progress Notes (Signed)
Subjective:   Wendy [name] is a 72 y.o. female who presents for an Initial Medicare Annual Wellness Visit.  Visit Complete: Virtual  I connected with  Wendy [name] on 11/08/22 by a audio enabled telemedicine application and verified that I am speaking with the correct person using two identifiers.  Patient Location: Home  Provider Location: Office/Clinic  I discussed the limitations of evaluation and management by telemedicine. The patient expressed understanding and agreed to proceed.  Vital Signs: Unable to obtain new vitals due to this being a telehealth visit.  Review of Systems     Cardiac Risk Factors include: advanced age (>22men, >3 women);dyslipidemia;hypertension     Objective:    Today's Vitals   11/08/22 1421  PainSc: 5    There is no height or weight on file to calculate BMI.     11/08/2022    2:28 PM 07/24/2022    1:18 AM 09/20/2019   12:11 AM 09/10/2019   12:23 PM 06/22/2019    1:45 AM 06/14/2019    1:34 PM 06/05/2019   10:22 AM  Advanced Directives  Does Patient Have a Medical Advance Directive? No No No No No No No  Would patient like information on creating a medical advance directive?  No - Patient declined No - Patient declined No - Patient declined No - Patient declined No - Patient declined     Current Medications (verified) Outpatient Encounter Medications as of 11/08/2022  Medication Sig   acetaminophen (TYLENOL) 500 MG tablet Take 500-1,000 mg by mouth every 6 (six) hours as needed (for pain.).   albuterol (PROVENTIL HFA;VENTOLIN HFA) 108 (90 Base) MCG/ACT inhaler Inhale 2 puffs into the lungs every 6 (six) hours as needed for wheezing or shortness of breath.   albuterol (PROVENTIL) (2.5 MG/3ML) 0.083% nebulizer solution Inhale 2.5 mg into the lungs 3 (three) times daily as needed for shortness of breath or wheezing.   ALPRAZolam (XANAX) 0.5 MG tablet Take 0.5 mg by mouth 3 (three) times daily as needed for anxiety.   apixaban (ELIQUIS) 5 MG  TABS tablet Take 1 tablet (5 mg total) by mouth 2 (two) times daily.   atorvastatin (LIPITOR) 80 MG tablet Take 1 tablet (80 mg total) by mouth daily.   ezetimibe (ZETIA) 10 MG tablet Take 10 mg by mouth daily.   Ferrous Sulfate Dried (SLOW RELEASE IRON) 45 MG TBCR Take by mouth.   fluticasone-salmeterol (ADVAIR) 250-50 MCG/ACT AEPB Inhale 1 puff into the lungs 2 (two) times daily.   furosemide (LASIX) 20 MG tablet Take 1 tablet (20 mg total) by mouth as needed for fluid or edema.   hydrocortisone (CORTEF) 5 MG tablet Take 1 tablet (5 mg total) by mouth daily.   isosorbide mononitrate (IMDUR) 30 MG 24 hr tablet Take 1 tablet (30 mg total) by mouth daily.   levothyroxine (SYNTHROID) 50 MCG tablet Take 50 mcg by mouth daily before breakfast.   nitroGLYCERIN (NITROSTAT) 0.4 MG SL tablet Place 1 tablet (0.4 mg total) under the tongue every 5 (five) minutes as needed for chest pain.   pantoprazole (PROTONIX) 40 MG tablet Take 1 tablet (40 mg total) by mouth daily.   pramipexole (MIRAPEX) 0.25 MG tablet Take 0.25 mg by mouth at bedtime.    promethazine (PHENERGAN) 12.5 MG tablet TAKE ONE TABLET BY MOUTH EVERY 6 HOURS AS NEEDED FOR NAUSEA AND VOMITING (Patient taking differently: Take 12.5 mg by mouth as needed for vomiting or nausea.)   ranolazine (RANEXA) 1000 MG SR  tablet Take 1 tablet (1,000 mg total) by mouth 2 (two) times daily.   sertraline (ZOLOFT) 100 MG tablet Take 150 mg by mouth at bedtime.    Vitamin D, Ergocalciferol, (DRISDOL) 1.25 MG (50000 UNIT) CAPS capsule Take 50,000 Units by mouth every 7 (seven) days.   oxyCODONE 10 MG TABS Take 1 tablet (10 mg total) by mouth 3 (three) times daily. (Patient not taking: Reported on 11/08/2022)   Facility-Administered Encounter Medications as of 11/08/2022  Medication   ferrous sulfate tablet 325 mg    Allergies (verified) Benadryl [diphenhydramine], Penicillins, Adhesive [tape], Amoxicillin, Hydrocodone-acetaminophen, Cyclobenzaprine, and  Hydrocodone   History: Past Medical History:  Diagnosis Date   Anemia    Anginal pain (HCC)    jaw pain 07/2016, s/p 07/15/16 nuclear stress test   Anxiety    Arthritis    "lower back" (08/24/2017)   Asthma    Chornic Obstructive Pulmonary Disease    Chronic back pain    Chronic combined systolic and diastolic CHF    EF 5/18: 60-65 // EF 4/21: 55-60 // EF 4/21: 20-25   Chronic leg pain    Chronic lower back pain    Coronary Artery Disease    a. s/p NSTEMI >> CABG 1998 // s/p PCI 2013 // LHC 01/2015: DES to SVG-RCA.// Cath 05/2019: all grafts occluded; mod non-obs LAD disease w L-L and L-R collats - med Rx // post Ao-Bifem bypass MI c/b PEA, CGS requiring Impella >> anatomy unchanged - Med Rx   Depression    Fall 08/2016   Gastroesophageal reflux disease    Glucose intolerance    Headache    "a few/month" (08/24/2017)   HLD (hyperlipidemia)    Hx of DVT X 3   RLE   Hypertension    Myocardial infarction (HCC) 1999   OSA on CPAP    Paroxysmal atrial fibrillation    a. 09/2013 post-op b. 01/2015 // Apixaban // recurrent AFib post MI after Ao-Bifem in 06/2019 >> Amio Rx   Peripheral artery disease    s/p L fem-fem bypass // s/p Ao-Bifem bypass in 06/2019   Peripheral neuropathy    Pneumonia    Shortness of breath    when walking   Wears glasses    Past Surgical History:  Procedure Laterality Date   ABDOMINAL AORTOGRAM W/LOWER EXTREMITY Bilateral 04/29/2019   Procedure: ABDOMINAL AORTOGRAM W/LOWER EXTREMITY;  Surgeon: Maeola Harman, MD;  Location: [facility] INVASIVE CV LAB;  Service: Cardiovascular;  Laterality: Bilateral;   ANTERIOR CERVICAL DECOMP/DISCECTOMY FUSION  1991   AORTA - BILATERAL FEMORAL ARTERY BYPASS GRAFT N/A 06/18/2019   Procedure: AORTA BIFEMORAL BYPASS GRAFT;  Surgeon: Maeola Harman, MD;  Location: [facility] OR;  Service: Vascular;  Laterality: N/A;   ARTERY REPAIR Left 06/30/2019   Procedure: LEFT BRACHIAL ARTERY REPAIR, EVACUATION OF HEMATOMA;  Surgeon:  Nada Libman, MD;  Location: [facility] OR;  Service: Vascular;  Laterality: Left;   BACK SURGERY     CARDIAC CATHETERIZATION N/A 01/22/2015   Procedure: Left Heart Cath and Coronary Angiography;  Surgeon: Lennette Bihari, MD;  Location: [facility] INVASIVE CV LAB;  Service: Cardiovascular;  Laterality: N/A;   CARDIAC CATHETERIZATION N/A 01/22/2015   Procedure: Coronary Stent Intervention;  Surgeon: Lennette Bihari, MD;  Location: [facility] INVASIVE CV LAB;  Service: Cardiovascular;  Laterality: N/A;  3.0x12 xience prox SVG to RCA   CARDIAC CATHETERIZATION  03/09/2000   hx/notes 03/20/2000   CHOLECYSTECTOMY N/A 09/16/2013   Procedure: LAPAROSCOPIC CHOLECYSTECTOMY CONVERTED TO  OPEN CHOLECYSTECTOMY WITH CHOLANGIOGRAM;  Surgeon: Shelly Rubenstein, MD;  Location: [facility] OR;  Service: General;  Laterality: N/A;   CORONARY ANGIOGRAPHY N/A 06/27/2019   Procedure: CORONARY ANGIOGRAPHY (CATH LAB);  Surgeon: Tonny Bollman, MD;  Location: [facility] INVASIVE CV LAB;  Service: Cardiovascular;  Laterality: N/A;   CORONARY ANGIOPLASTY WITH STENT PLACEMENT  11/2011, 02/2012, 01/2015   DES to SVG-RCA both times, In [place], [place], Dr. Unice Cobble   CORONARY ARTERY BYPASS GRAFT  1998   LIMA-LAD, SVG-OM, SVG-RCA   ERCP N/A 09/19/2013   Procedure: ENDOSCOPIC RETROGRADE CHOLANGIOPANCREATOGRAPHY (ERCP);  Surgeon: Louis Meckel, MD;  Location: [facility] ENDOSCOPY;  Service: Endoscopy;  Laterality: N/A;   FINGER SURGERY Right    ring finger "fused"   FRACTURE SURGERY     IRRIGATION AND DEBRIDEMENT ABSCESS Right 10/17/2013   Procedure: INCISION AND DRAINAGE RIGHT SUBCOSTAL WOUND;  Surgeon: Kandis Cocking, MD;  Location: [facility] ORS;  Service: General;  Laterality: Right;   JOINT REPLACEMENT     KNEE ARTHROSCOPY Right 1998   LEFT HEART CATH AND CORS/GRAFTS ANGIOGRAPHY N/A 07/25/2016   Procedure: Left Heart Cath and Cors/Grafts Angiography;  Surgeon: Corky Crafts, MD;  Location: [facility] INVASIVE CV LAB;  Service: Cardiovascular;  Laterality: N/A;   LEFT HEART  CATH AND CORS/GRAFTS ANGIOGRAPHY N/A 06/05/2019   Procedure: LEFT HEART CATH AND CORS/GRAFTS ANGIOGRAPHY;  Surgeon: Kathleene Hazel, MD;  Location: [facility] INVASIVE CV LAB;  Service: Cardiovascular;  Laterality: N/A;   LEFT HEART CATHETERIZATION WITH CORONARY/GRAFT ANGIOGRAM N/A 07/19/2013   Procedure: LEFT HEART CATHETERIZATION WITH Isabel Caprice;  Surgeon: Marykay Lex, MD;  Location: [facility] CATH LAB;  Service: Cardiovascular;  Laterality: N/A;   MASS EXCISION Right 06/25/2014   Procedure: EXCISION BUTTOCK MASS ;  Surgeon: Abigail Miyamoto, MD;  Location: [facility];  Service: General;  Laterality: Right;   ORIF HUMERUS FRACTURE Left 08/27/2013   Procedure: OPEN REDUCTION INTERNAL FIXATION (ORIF) LEFT HUMERUS ;  Surgeon: Budd Palmer, MD;  Location: [facility] OR;  Service: Orthopedics;  Laterality: Left;   PLACEMENT OF IMPELLA LEFT VENTRICULAR ASSIST DEVICE N/A 06/24/2019   Procedure: PLACEMENT OF IMPELLA LEFT VENTRICULAR ASSIST DEVICE;  Surgeon: Linden Dolin, MD;  Location: [facility] OR;  Service: Open Heart Surgery;  Laterality: N/A;   POSTERIOR FUSION CERVICAL SPINE  1992   REMOVAL OF IMPELLA LEFT VENTRICULAR ASSIST DEVICE Right 06/28/2019   Procedure: REMOVAL OF IMPELLA LEFT VENTRICULAR ASSIST DEVICE;  Surgeon: Linden Dolin, MD;  Location: [facility] OR;  Service: Open Heart Surgery;  Laterality: Right;   THROMBECTOMY / EMBOLECTOMY FEMORAL ARTERY Right    hx/notes 03/20/2000   TOTAL HIP ARTHROPLASTY Right 07/19/2016   Procedure: TOTAL HIP ARTHROPLASTY ANTERIOR APPROACH;  Surgeon: Sheral Apley, MD;  Location: [facility] OR;  Service: Orthopedics;  Laterality: Right;   TOTAL HIP REVISION Right 08/22/2017   Procedure: TOTAL HIP REVISION;  Surgeon: Sheral Apley, MD;  Location: [facility] OR;  Service: Orthopedics;  Laterality: Right;   Family History  Problem Relation Age of Onset   Lung cancer Mother    Clotting disorder Father    Lung cancer Sister    Heart disease Brother    Heart  disease Brother    Colon cancer Neg Hx    Breast cancer Neg Hx    Social History   Socioeconomic History   Marital status: Divorced    Spouse name: Not on file   Number of children: 5   Years of education: Not on file  Highest education level: Not on file  Occupational History   Occupation: Disabled  Tobacco Use   Smoking status: Former    Current packs/day: 0.00    Average packs/day: 0.5 packs/day for 34.0 years (17.0 ttl pk-yrs)    Types: Cigarettes    Start date: 07/19/1964    Quit date: 07/20/1998    Years since quitting: 24.3   Smokeless tobacco: Never  Vaping Use   Vaping status: Never Used  Substance and Sexual Activity   Alcohol use: Not Currently   Drug use: Yes    Types: Marijuana    Comment: ediables   Sexual activity: Not Currently  Other Topics Concern   Not on file  Social History Narrative   Lives with best friend.    Social Determinants of Health   Financial Resource Strain: Low Risk  (11/08/2022)   Overall Financial Resource Strain (CARDIA)    Difficulty of Paying Living Expenses: Not hard at all  Food Insecurity: No Food Insecurity (11/08/2022)   Hunger Vital Sign    Worried About Running Out of Food in the Last Year: Never true    Ran Out of Food in the Last Year: Never true  Transportation Needs: No Transportation Needs (11/08/2022)   PRAPARE - Administrator, Civil Service (Medical): No    Lack of Transportation (Non-Medical): No  Physical Activity: Inactive (11/08/2022)   Exercise Vital Sign    Days of Exercise per Week: 0 days    Minutes of Exercise per Session: 0 min  Stress: Stress Concern Present (11/08/2022)   [employer] of Occupational Health - Occupational Stress Questionnaire    Feeling of Stress : To some extent  Social Connections: Socially Isolated (11/08/2022)   Social Connection and Isolation Panel [NHANES]    Frequency of Communication with Friends and Family: More than three times a week    Frequency of Social  Gatherings with Friends and Family: More than three times a week    Attends Religious Services: Never    Database administrator or Organizations: No    Attends Engineer, structural: Not on file    Marital Status: Divorced    Tobacco Counseling Counseling given: Not Answered   Clinical Intake:  Pre-visit preparation completed: Yes  Pain : 0-10 Pain Score: 5  Pain Type: Chronic pain Pain Location: Back Pain Orientation: Lower Pain Descriptors / Indicators: Aching Pain Onset: More than a month ago Pain Frequency: Constant     Nutritional Risks: None Diabetes: No  How often do you need to have someone help you when you read instructions, pamphlets, or other written materials from your doctor or pharmacy?: 1 - Never  Interpreter Needed?: No  Information entered by :: NAllen LPN   Activities of Daily Living    11/08/2022    2:22 PM 07/24/2022    6:15 PM  In your present state of health, do you have any difficulty performing the following activities:  Hearing? 1 0  Comment has hearing aids   Vision? 0 0  Difficulty concentrating or making decisions? 0 0  Walking or climbing stairs? 1 0  Comment due to back neuropathy   Dressing or bathing? 0 0  Doing errands, shopping? 0 0  Preparing Food and eating ? N   Using the Toilet? N   In the past six months, have you accidently leaked urine? Y   Comment wears pull ups   Do you have problems with loss of bowel control? N  Managing your Medications? N   Managing your Finances? N   Housekeeping or managing your Housekeeping? N     Patient Care Team: Tysinger, Kermit Balo, PA-C as PCP - General (Family Medicine) Nahser, Deloris Ping, MD as PCP - Cardiology (Cardiology) Abigail Miyamoto, MD as Consulting Physician (General Surgery)  Indicate any recent Medical Services you may have received from other than [facility] providers in the past year (date may be approximate).     Assessment:   This is a routine wellness  examination for Wendy [name].  Hearing/Vision screen Hearing Screening - Comments:: Has hearing aids that are maintained Vision Screening - Comments:: Regular eye exams, [facility]  Dietary issues and exercise activities discussed:     Goals Addressed             This Visit's Progress    Patient Stated       11/08/2022, denies goals       Depression Screen    11/08/2022    2:30 PM 04/12/2022    3:33 PM 10/02/2017    2:10 PM  PHQ 2/9 Scores  PHQ - 2 Score 0 0 1  PHQ- 9 Score  7     Fall Risk    11/08/2022    2:29 PM 04/12/2022    3:33 PM 10/02/2017    2:10 PM  Fall Risk   Falls in the past year? 1 1 No  Comment neuropathy in feet    Number falls in past yr: 1 1   Injury with Fall? 0 1   Risk for fall due to : Impaired balance/gait;Impaired mobility;History of fall(s);Medication side effect Impaired balance/gait   Follow up Falls prevention discussed;Falls evaluation completed Falls evaluation completed     MEDICARE RISK AT HOME: Medicare Risk at Home Any stairs in or around the home?: Yes If so, are there any without handrails?: No Home free of loose throw rugs in walkways, pet beds, electrical cords, etc?: Yes Adequate lighting in your home to reduce risk of falls?: Yes Life alert?: No Use of a cane, walker or w/c?: Yes Grab bars in the bathroom?: No Shower chair or bench in shower?: No Elevated toilet seat or a handicapped toilet?: Yes  TIMED UP AND GO:  Was the test performed? No    Cognitive Function:        11/08/2022    2:30 PM  6CIT Screen  What Year? 0 points  What month? 0 points  What time? 0 points  Count back from 20 0 points  Months in reverse 0 points  Repeat phrase 0 points  Total Score 0 points    Immunizations Immunization History  Administered Date(s) Administered   Hepatitis A, Adult 09/10/2018, 09/11/2018   Influenza Split 03/03/2015, 01/09/2018   Influenza, High Dose Seasonal PF 01/09/2018   Influenza-Unspecified 12/11/2018,  02/25/2020, 12/02/2020   PFIZER(Purple Top)SARS-COV-2 Vaccination 05/05/2019, 06/04/2019   Pneumococcal Conjugate-13 04/18/2016   Pneumococcal Polysaccharide-23 03/08/2011, 09/10/2018, 09/11/2018   Tdap 10/26/2017   Zoster Recombinant(Shingrix) 09/10/2018, 09/11/2018    TDAP status: Up to date  Flu Vaccine status: Due, Education has been provided regarding the importance of this vaccine. Advised may receive this vaccine at local pharmacy or Health Dept. Aware to provide a copy of the vaccination record if obtained from local pharmacy or Health Dept. Verbalized acceptance and understanding.  Pneumococcal vaccine status: Up to date  Covid-19 vaccine status: Information provided on how to obtain vaccines.   Qualifies for Shingles Vaccine? Yes   Zostavax completed  No   Shingrix Completed?: no  Screening Tests Health Maintenance  Topic Date Due   Hepatitis C Screening  Never done   DEXA SCAN  Never done   Zoster Vaccines- Shingrix (2 of 2) 11/06/2018   COVID-19 Vaccine (3 - Pfizer risk series) 07/02/2019   MAMMOGRAM  10/19/2019   INFLUENZA VACCINE  10/06/2022   Medicare Annual Wellness (AWV)  11/08/2023   Colonoscopy  10/19/2025   DTaP/Tdap/Td (2 - Td or Tdap) 10/27/2027   Pneumonia Vaccine 57+ Years old  Completed   HPV VACCINES  Aged Out    Health Maintenance  Health Maintenance Due  Topic Date Due   Hepatitis C Screening  Never done   DEXA SCAN  Never done   Zoster Vaccines- Shingrix (2 of 2) 11/06/2018   COVID-19 Vaccine (3 - Pfizer risk series) 07/02/2019   MAMMOGRAM  10/19/2019   INFLUENZA VACCINE  10/06/2022    Colorectal cancer screening: Type of screening: Colonoscopy. Completed 10/20/2015. Repeat every 10 years  Mammogram status: Ordered 01/11/2022. Pt provided with contact info and advised to call to schedule appt.   Bone Density status: Ordered 01/11/2022. Pt provided with contact info and advised to call to schedule appt.  Lung Cancer Screening: (Low Dose CT  Chest recommended if Age 2-80 years, 20 pack-year currently smoking OR have quit w/in 15years.) does not qualify.   Lung Cancer Screening Referral: no  Additional Screening:  Hepatitis C Screening: does qualify;   Vision Screening: Recommended annual ophthalmology exams for early detection of glaucoma and other disorders of the eye. Is the patient up to date with their annual eye exam?  No  Who is the provider or what is the name of the office in which the patient attends annual eye exams? [facility] If pt is not established with a provider, would they like to be referred to a provider to establish care? No .   Dental Screening: Recommended annual dental exams for proper oral hygiene  Diabetic Foot Exam: n/a  Community Resource Referral / Chronic Care Management: CRR required this visit?  No   CCM required this visit?  No     Plan:     I have personally reviewed and noted the following in the patient's chart:   Medical and social history Use of alcohol, tobacco or illicit drugs  Current medications and supplements including opioid prescriptions. Patient is not currently taking opioid prescriptions. Functional ability and status Nutritional status Physical activity Advanced directives List of other physicians Hospitalizations, surgeries, and ER visits in previous 12 months Vitals Screenings to include cognitive, depression, and falls Referrals and appointments  In addition, I have reviewed and discussed with patient certain preventive protocols, quality metrics, and best practice recommendations. A written personalized care plan for preventive services as well as general preventive health recommendations were provided to patient.     Barb Merino, LPN   07/08/6642   After Visit Summary: (Pick Up) Due to this being a telephonic visit, with patients personalized plan was offered to patient and patient has requested to Pick up at office.  Nurse Notes:  none

## 2022-11-11 ENCOUNTER — Other Ambulatory Visit: Payer: Self-pay | Admitting: Cardiovascular Disease

## 2022-11-14 ENCOUNTER — Other Ambulatory Visit: Payer: Self-pay | Admitting: Cardiovascular Disease

## 2022-12-12 ENCOUNTER — Telehealth: Payer: Self-pay | Admitting: Cardiovascular Disease

## 2022-12-12 NOTE — Telephone Encounter (Signed)
*  STAT* If patient is at the pharmacy, call can be transferred to refill team.   1. Which medications need to be refilled? (please list name of each medication and dose if known)   nitroGLYCERIN (NITROSTAT) 0.4 MG SL tablet   2. Would you like to learn more about the convenience, safety, & potential cost savings by using the [facility]?    3. Are you open to using the [facility] (Type [facility]. ).   4. Which pharmacy/location (including street and city if local pharmacy) is medication to be sent to?  [facility] - [place], [place] - [id number]  [address]   5. Do they need a 30 day or 90 day supply?   90 day  Patient stated she is completely out of this medication.

## 2022-12-13 MED ORDER — NITROGLYCERIN 0.4 MG SL SUBL: 0.4000 mg | SUBLINGUAL_TABLET | SUBLINGUAL | 2 refills | Status: DC | PRN

## 2022-12-13 NOTE — Telephone Encounter (Signed)
Pt's medication was sent to pt's pharmacy as requested. Confirmation received.  °

## 2023-01-19 ENCOUNTER — Other Ambulatory Visit: Payer: Self-pay | Admitting: Cardiovascular Disease

## 2023-01-20 MED ORDER — APIXABAN 5 MG PO TABS: 5.0000 mg | ORAL_TABLET | Freq: Two times a day (BID) | ORAL | 3 refills | Status: DC

## 2023-02-20 ENCOUNTER — Other Ambulatory Visit: Payer: Self-pay | Admitting: Cardiovascular Disease

## 2023-02-25 ENCOUNTER — Encounter: Payer: Self-pay | Admitting: Physician Assistant

## 2023-02-25 NOTE — Telephone Encounter (Signed)
Note opened in error.

## 2023-02-27 ENCOUNTER — Other Ambulatory Visit: Payer: Self-pay | Admitting: Cardiovascular Disease

## 2023-03-22 ENCOUNTER — Other Ambulatory Visit: Payer: Self-pay | Admitting: Cardiovascular Disease

## 2023-03-27 ENCOUNTER — Other Ambulatory Visit: Payer: Self-pay | Admitting: Cardiovascular Disease

## 2023-03-29 ENCOUNTER — Other Ambulatory Visit: Payer: Self-pay | Admitting: Cardiovascular Disease

## 2023-04-26 ENCOUNTER — Other Ambulatory Visit: Payer: Self-pay | Admitting: Cardiovascular Disease

## 2023-04-28 ENCOUNTER — Other Ambulatory Visit: Payer: Self-pay | Admitting: Cardiovascular Disease

## 2023-05-21 ENCOUNTER — Encounter (HOSPITAL_COMMUNITY): Payer: Self-pay

## 2023-05-21 ENCOUNTER — Other Ambulatory Visit: Payer: Self-pay

## 2023-05-21 ENCOUNTER — Ambulatory Visit (HOSPITAL_COMMUNITY)
Admission: EM | Admit: 2023-05-21 | Discharge: 2023-05-21 | Disposition: A | Discharge status: Other Acute Inpatient Hospital | Attending: Internal Medicine | Admitting: Internal Medicine

## 2023-05-21 ENCOUNTER — Emergency Department (HOSPITAL_COMMUNITY)

## 2023-05-21 ENCOUNTER — Inpatient Hospital Stay (HOSPITAL_COMMUNITY)
Admission: EM | Admit: 2023-05-21 | Discharge: 2023-05-26 | DRG: 178 | Disposition: A | Discharge status: Home Health | Attending: Internal Medicine | Admitting: Internal Medicine

## 2023-05-21 DIAGNOSIS — I110 Hypertensive heart disease with heart failure: Secondary | ICD-10-CM | POA: Diagnosis present

## 2023-05-21 DIAGNOSIS — Z951 Presence of aortocoronary bypass graft: Secondary | ICD-10-CM

## 2023-05-21 DIAGNOSIS — Z7901 Long term (current) use of anticoagulants: Secondary | ICD-10-CM

## 2023-05-21 DIAGNOSIS — Z801 Family history of malignant neoplasm of trachea, bronchus and lung: Secondary | ICD-10-CM

## 2023-05-21 DIAGNOSIS — I5042 Chronic combined systolic (congestive) and diastolic (congestive) heart failure: Secondary | ICD-10-CM | POA: Diagnosis present

## 2023-05-21 DIAGNOSIS — E039 Hypothyroidism, unspecified: Secondary | ICD-10-CM | POA: Diagnosis present

## 2023-05-21 DIAGNOSIS — Z885 Allergy status to narcotic agent status: Secondary | ICD-10-CM

## 2023-05-21 DIAGNOSIS — Z66 Do not resuscitate: Secondary | ICD-10-CM | POA: Diagnosis present

## 2023-05-21 DIAGNOSIS — E785 Hyperlipidemia, unspecified: Secondary | ICD-10-CM | POA: Diagnosis present

## 2023-05-21 DIAGNOSIS — I10 Essential (primary) hypertension: Secondary | ICD-10-CM | POA: Diagnosis present

## 2023-05-21 DIAGNOSIS — U071 COVID-19: Principal | ICD-10-CM | POA: Diagnosis present

## 2023-05-21 DIAGNOSIS — I25119 Atherosclerotic heart disease of native coronary artery with unspecified angina pectoris: Secondary | ICD-10-CM | POA: Diagnosis present

## 2023-05-21 DIAGNOSIS — Z86718 Personal history of other venous thrombosis and embolism: Secondary | ICD-10-CM

## 2023-05-21 DIAGNOSIS — G2581 Restless legs syndrome: Secondary | ICD-10-CM | POA: Diagnosis present

## 2023-05-21 DIAGNOSIS — Z955 Presence of coronary angioplasty implant and graft: Secondary | ICD-10-CM

## 2023-05-21 DIAGNOSIS — J441 Chronic obstructive pulmonary disease with (acute) exacerbation: Secondary | ICD-10-CM | POA: Diagnosis not present

## 2023-05-21 DIAGNOSIS — Z91048 Other nonmedicinal substance allergy status: Secondary | ICD-10-CM

## 2023-05-21 DIAGNOSIS — Z96641 Presence of right artificial hip joint: Secondary | ICD-10-CM | POA: Diagnosis present

## 2023-05-21 DIAGNOSIS — Z87891 Personal history of nicotine dependence: Secondary | ICD-10-CM

## 2023-05-21 DIAGNOSIS — D638 Anemia in other chronic diseases classified elsewhere: Secondary | ICD-10-CM | POA: Diagnosis present

## 2023-05-21 DIAGNOSIS — D696 Thrombocytopenia, unspecified: Secondary | ICD-10-CM | POA: Diagnosis present

## 2023-05-21 DIAGNOSIS — I5030 Unspecified diastolic (congestive) heart failure: Secondary | ICD-10-CM | POA: Diagnosis present

## 2023-05-21 DIAGNOSIS — I2510 Atherosclerotic heart disease of native coronary artery without angina pectoris: Secondary | ICD-10-CM | POA: Diagnosis present

## 2023-05-21 DIAGNOSIS — Z888 Allergy status to other drugs, medicaments and biological substances status: Secondary | ICD-10-CM

## 2023-05-21 DIAGNOSIS — I739 Peripheral vascular disease, unspecified: Secondary | ICD-10-CM | POA: Diagnosis present

## 2023-05-21 DIAGNOSIS — D72819 Decreased white blood cell count, unspecified: Secondary | ICD-10-CM | POA: Diagnosis present

## 2023-05-21 DIAGNOSIS — Z8249 Family history of ischemic heart disease and other diseases of the circulatory system: Secondary | ICD-10-CM

## 2023-05-21 DIAGNOSIS — R0600 Dyspnea, unspecified: Secondary | ICD-10-CM

## 2023-05-21 DIAGNOSIS — Z880 Allergy status to penicillin: Secondary | ICD-10-CM

## 2023-05-21 DIAGNOSIS — F419 Anxiety disorder, unspecified: Secondary | ICD-10-CM | POA: Diagnosis present

## 2023-05-21 DIAGNOSIS — R5381 Other malaise: Secondary | ICD-10-CM | POA: Diagnosis present

## 2023-05-21 DIAGNOSIS — G4730 Sleep apnea, unspecified: Secondary | ICD-10-CM | POA: Diagnosis present

## 2023-05-21 DIAGNOSIS — R0602 Shortness of breath: Secondary | ICD-10-CM

## 2023-05-21 DIAGNOSIS — I252 Old myocardial infarction: Secondary | ICD-10-CM

## 2023-05-21 DIAGNOSIS — I4891 Unspecified atrial fibrillation: Secondary | ICD-10-CM | POA: Diagnosis not present

## 2023-05-21 DIAGNOSIS — Z7951 Long term (current) use of inhaled steroids: Secondary | ICD-10-CM

## 2023-05-21 DIAGNOSIS — I480 Paroxysmal atrial fibrillation: Secondary | ICD-10-CM | POA: Diagnosis present

## 2023-05-21 DIAGNOSIS — Z79899 Other long term (current) drug therapy: Secondary | ICD-10-CM

## 2023-05-21 DIAGNOSIS — Z79890 Hormone replacement therapy (postmenopausal): Secondary | ICD-10-CM

## 2023-05-21 DIAGNOSIS — G4733 Obstructive sleep apnea (adult) (pediatric): Secondary | ICD-10-CM | POA: Diagnosis present

## 2023-05-21 DIAGNOSIS — K219 Gastro-esophageal reflux disease without esophagitis: Secondary | ICD-10-CM | POA: Diagnosis present

## 2023-05-21 DIAGNOSIS — F322 Major depressive disorder, single episode, severe without psychotic features: Secondary | ICD-10-CM | POA: Diagnosis present

## 2023-05-21 LAB — RESP PANEL BY RT-PCR (RSV, FLU A&B, COVID)  RVPGX2
Influenza A by PCR: NEGATIVE
Influenza B by PCR: NEGATIVE
Resp Syncytial Virus by PCR: NEGATIVE
SARS Coronavirus 2 by RT PCR: POSITIVE — AB

## 2023-05-21 LAB — CBC WITH DIFFERENTIAL/PLATELET
Abs Immature Granulocytes: 0.01 10*3/uL (ref 0.00–0.07)
Basophils Absolute: 0 10*3/uL (ref 0.0–0.1)
Basophils Relative: 0 %
Eosinophils Absolute: 0 10*3/uL (ref 0.0–0.5)
Eosinophils Relative: 1 %
HCT: 28.1 % — ABNORMAL LOW (ref 36.0–46.0)
Hemoglobin: 8.3 g/dL — ABNORMAL LOW (ref 12.0–15.0)
Immature Granulocytes: 0 %
Lymphocytes Relative: 14 %
Lymphs Abs: 0.4 10*3/uL — ABNORMAL LOW (ref 0.7–4.0)
MCH: 25.9 pg — ABNORMAL LOW (ref 26.0–34.0)
MCHC: 29.5 g/dL — ABNORMAL LOW (ref 30.0–36.0)
MCV: 87.5 fL (ref 80.0–100.0)
Monocytes Absolute: 0.2 10*3/uL (ref 0.1–1.0)
Monocytes Relative: 5 %
Neutro Abs: 2.6 10*3/uL (ref 1.7–7.7)
Neutrophils Relative %: 80 %
Platelets: 141 10*3/uL — ABNORMAL LOW (ref 150–400)
RBC: 3.21 MIL/uL — ABNORMAL LOW (ref 3.87–5.11)
RDW: 17.2 % — ABNORMAL HIGH (ref 11.5–15.5)
WBC: 3.2 10*3/uL — ABNORMAL LOW (ref 4.0–10.5)
nRBC: 0 % (ref 0.0–0.2)

## 2023-05-21 LAB — COMPREHENSIVE METABOLIC PANEL
ALT: 18 U/L (ref 0–44)
AST: 23 U/L (ref 15–41)
Albumin: 2.8 g/dL — ABNORMAL LOW (ref 3.5–5.0)
Alkaline Phosphatase: 52 U/L (ref 38–126)
Anion gap: 10 (ref 5–15)
BUN: 15 mg/dL (ref 8–23)
CO2: 22 mmol/L (ref 22–32)
Calcium: 8 mg/dL — ABNORMAL LOW (ref 8.9–10.3)
Chloride: 104 mmol/L (ref 98–111)
Creatinine, Ser: 0.82 mg/dL (ref 0.44–1.00)
GFR, Estimated: >60 mL/min (ref >60)
Glucose, Bld: 92 mg/dL (ref 70–99)
Potassium: 4.3 mmol/L (ref 3.5–5.1)
Sodium: 136 mmol/L (ref 135–145)
Total Bilirubin: 0.5 mg/dL (ref 0.0–1.2)
Total Protein: 5.9 g/dL — ABNORMAL LOW (ref 6.5–8.1)

## 2023-05-21 LAB — BRAIN NATRIURETIC PEPTIDE: B Natriuretic Peptide: 595.1 pg/mL — ABNORMAL HIGH (ref 0.0–100.0)

## 2023-05-21 LAB — MAGNESIUM: Magnesium: 1.5 mg/dL — ABNORMAL LOW (ref 1.7–2.4)

## 2023-05-21 MED ORDER — APIXABAN 5 MG PO TABS
5.0000 mg | ORAL_TABLET | Freq: Two times a day (BID) | ORAL | Status: DC
  Administered 2023-05-22 – 2023-05-26 (×10): 5 mg via ORAL
  Filled 2023-05-21 (×10): qty 1

## 2023-05-21 MED ORDER — MAGNESIUM SULFATE 2 GM/50ML IV SOLN
2.0000 g | Freq: Once | INTRAVENOUS | Status: AC
Start: 2023-05-21 — End: 2023-05-21
  Administered 2023-05-21: 2 g via INTRAVENOUS
  Filled 2023-05-21: qty 50

## 2023-05-21 MED ORDER — IPRATROPIUM-ALBUTEROL 0.5-2.5 (3) MG/3ML IN SOLN: 3.0000 mL | Freq: Four times a day (QID) | RESPIRATORY_TRACT | Status: DC | PRN

## 2023-05-21 MED ORDER — ALBUTEROL SULFATE HFA 108 (90 BASE) MCG/ACT IN AERS
4.0000 | INHALATION_SPRAY | Freq: Once | RESPIRATORY_TRACT | Status: AC
  Administered 2023-05-21: 4 via RESPIRATORY_TRACT
  Filled 2023-05-21: qty 6.7

## 2023-05-21 MED ORDER — METOPROLOL TARTRATE 5 MG/5ML IV SOLN: 5.0000 mg | Freq: Four times a day (QID) | INTRAVENOUS | Status: DC | PRN

## 2023-05-21 MED ORDER — BUDESONIDE 0.25 MG/2ML IN SUSP
0.2500 mg | Freq: Two times a day (BID) | RESPIRATORY_TRACT | Status: DC
  Administered 2023-05-22 – 2023-05-26 (×9): 0.25 mg via RESPIRATORY_TRACT
  Filled 2023-05-21 (×10): qty 2

## 2023-05-21 MED ORDER — SODIUM CHLORIDE 0.9% FLUSH
3.0000 mL | Freq: Two times a day (BID) | INTRAVENOUS | Status: DC
  Administered 2023-05-22 – 2023-05-26 (×10): 3 mL via INTRAVENOUS

## 2023-05-21 MED ORDER — LEVOTHYROXINE SODIUM 25 MCG PO TABS
25.0000 ug | ORAL_TABLET | Freq: Every day | ORAL | Status: DC
Start: 2023-05-22 — End: 2023-05-26
  Administered 2023-05-23 – 2023-05-26 (×4): 25 ug via ORAL
  Filled 2023-05-21 (×4): qty 1

## 2023-05-21 MED ORDER — BUPROPION HCL ER (XL) 150 MG PO TB24
150.0000 mg | ORAL_TABLET | Freq: Every day | ORAL | Status: DC
Start: 2023-05-22 — End: 2023-05-26
  Administered 2023-05-22 – 2023-05-26 (×5): 150 mg via ORAL
  Filled 2023-05-21 (×5): qty 1

## 2023-05-21 MED ORDER — EZETIMIBE 10 MG PO TABS
10.0000 mg | ORAL_TABLET | Freq: Every day | ORAL | Status: DC
  Administered 2023-05-22 – 2023-05-26 (×5): 10 mg via ORAL
  Filled 2023-05-21 (×5): qty 1

## 2023-05-21 MED ORDER — PROCHLORPERAZINE EDISYLATE 10 MG/2ML IJ SOLN
10.0000 mg | Freq: Four times a day (QID) | INTRAMUSCULAR | Status: DC | PRN
  Administered 2023-05-22: 10 mg via INTRAVENOUS
  Filled 2023-05-21: qty 2

## 2023-05-21 MED ORDER — ACETAMINOPHEN 650 MG RE SUPP: 650.0000 mg | Freq: Four times a day (QID) | RECTAL | Status: DC | PRN

## 2023-05-21 MED ORDER — SENNOSIDES-DOCUSATE SODIUM 8.6-50 MG PO TABS: 1.0000 | ORAL_TABLET | Freq: Every evening | ORAL | Status: DC | PRN

## 2023-05-21 MED ORDER — ISOSORBIDE MONONITRATE ER 30 MG PO TB24
30.0000 mg | ORAL_TABLET | Freq: Every day | ORAL | Status: DC
  Administered 2023-05-22 – 2023-05-26 (×5): 30 mg via ORAL
  Filled 2023-05-21 (×5): qty 1

## 2023-05-21 MED ORDER — SERTRALINE HCL 50 MG PO TABS
150.0000 mg | ORAL_TABLET | Freq: Every day | ORAL | Status: DC
  Administered 2023-05-22 – 2023-05-25 (×5): 150 mg via ORAL
  Filled 2023-05-21 (×5): qty 1

## 2023-05-21 MED ORDER — ARFORMOTEROL TARTRATE 15 MCG/2ML IN NEBU
15.0000 ug | INHALATION_SOLUTION | Freq: Two times a day (BID) | RESPIRATORY_TRACT | Status: DC
  Administered 2023-05-22 – 2023-05-26 (×9): 15 ug via RESPIRATORY_TRACT
  Filled 2023-05-21 (×10): qty 2

## 2023-05-21 MED ORDER — METHYLPREDNISOLONE SODIUM SUCC 40 MG IJ SOLR
40.0000 mg | Freq: Two times a day (BID) | INTRAMUSCULAR | Status: DC
  Administered 2023-05-22 – 2023-05-24 (×6): 40 mg via INTRAVENOUS
  Filled 2023-05-21 (×6): qty 1

## 2023-05-21 MED ORDER — ATORVASTATIN CALCIUM 80 MG PO TABS
80.0000 mg | ORAL_TABLET | Freq: Every day | ORAL | Status: DC
Start: 2023-05-22 — End: 2023-05-26
  Administered 2023-05-22 – 2023-05-26 (×5): 80 mg via ORAL
  Filled 2023-05-21 (×3): qty 1
  Filled 2023-05-21: qty 2
  Filled 2023-05-21: qty 1

## 2023-05-21 MED ORDER — GUAIFENESIN ER 600 MG PO TB12
600.0000 mg | ORAL_TABLET | Freq: Two times a day (BID) | ORAL | Status: DC
  Administered 2023-05-22 – 2023-05-26 (×10): 600 mg via ORAL
  Filled 2023-05-21 (×10): qty 1

## 2023-05-21 MED ORDER — RANOLAZINE ER 500 MG PO TB12
1000.0000 mg | ORAL_TABLET | Freq: Two times a day (BID) | ORAL | Status: DC
  Administered 2023-05-22 – 2023-05-26 (×10): 1000 mg via ORAL
  Filled 2023-05-21 (×10): qty 2

## 2023-05-21 MED ORDER — PANTOPRAZOLE SODIUM 40 MG PO TBEC
40.0000 mg | DELAYED_RELEASE_TABLET | Freq: Every day | ORAL | Status: DC
  Administered 2023-05-22 – 2023-05-26 (×5): 40 mg via ORAL
  Filled 2023-05-21 (×5): qty 1

## 2023-05-21 MED ORDER — PRAMIPEXOLE DIHYDROCHLORIDE 0.25 MG PO TABS
0.5000 mg | ORAL_TABLET | Freq: Every day | ORAL | Status: DC
  Administered 2023-05-22 – 2023-05-25 (×5): 0.5 mg via ORAL
  Filled 2023-05-21 (×6): qty 2

## 2023-05-21 MED ORDER — ALPRAZOLAM 0.5 MG PO TABS
0.5000 mg | ORAL_TABLET | Freq: Three times a day (TID) | ORAL | Status: DC | PRN
  Administered 2023-05-23 – 2023-05-25 (×3): 0.5 mg via ORAL
  Filled 2023-05-21 (×3): qty 1

## 2023-05-21 MED ORDER — ACETAMINOPHEN 325 MG PO TABS
650.0000 mg | ORAL_TABLET | Freq: Four times a day (QID) | ORAL | Status: DC | PRN
  Administered 2023-05-22 – 2023-05-24 (×4): 650 mg via ORAL
  Filled 2023-05-21 (×4): qty 2

## 2023-05-21 NOTE — ED Notes (Signed)
Provider at bedside to eval Pt.

## 2023-05-21 NOTE — ED Provider Notes (Signed)
  EMERGENCY DEPARTMENT AT [facility] Provider Note   CSN: [id number] Arrival date & time: 05/21/23  1803     History  Chief Complaint  Patient presents with   Shortness of Breath    Wendy [name] is a 73 y.o. female.   Shortness of Breath Patient with shortness of breath.  Has had cough.  Has had for a few weeks.  Feels as if she has to have some sputum production but has not been bringing anything up.  Seen in urgent care.  Came in for A-fib with RVR.  Does have history of atrial fibrillation.  Is on anticoagulation.    Past Medical History:  Diagnosis Date   Anemia    Anginal pain (HCC)    jaw pain 07/2016, s/p 07/15/16 nuclear stress test   Anxiety    Arthritis    "lower back" (08/24/2017)   Asthma    Chornic Obstructive Pulmonary Disease    Chronic back pain    Chronic combined systolic and diastolic CHF    EF 5/18: 60-65 // EF 4/21: 55-60 // EF 4/21: 20-25   Chronic leg pain    Chronic lower back pain    Coronary Artery Disease    a. s/p NSTEMI >> CABG 1998 // s/p PCI 2013 // LHC 01/2015: DES to SVG-RCA.// Cath 05/2019: all grafts occluded; mod non-obs LAD disease w L-L and L-R collats - med Rx // post Ao-Bifem bypass MI c/b PEA, CGS requiring Impella >> anatomy unchanged - Med Rx   Depression    Fall 08/2016   Gastroesophageal reflux disease    Glucose intolerance    Headache    "a few/month" (08/24/2017)   HLD (hyperlipidemia)    Hx of DVT X 3   RLE   Hypertension    Myocardial infarction (HCC) 1999   OSA on CPAP    Paroxysmal atrial fibrillation    a. 09/2013 post-op b. 01/2015 // Apixaban // recurrent AFib post MI after Ao-Bifem in 06/2019 >> Amio Rx   Peripheral artery disease    s/p L fem-fem bypass // s/p Ao-Bifem bypass in 06/2019   Peripheral neuropathy    Pneumonia    Shortness of breath    when walking   Wears glasses     Home Medications Prior to Admission medications   Medication Sig Start Date End Date Taking?  Authorizing Provider  acetaminophen (TYLENOL) 500 MG tablet Take 500-1,000 mg by mouth every 6 (six) hours as needed (for pain.).    [provider]  albuterol (PROVENTIL HFA;VENTOLIN HFA) 108 (90 Base) MCG/ACT inhaler Inhale 2 puffs into the lungs every 6 (six) hours as needed for wheezing or shortness of breath. 01/01/18   Bhagat, Sharrell Ku, PA  albuterol (PROVENTIL) (2.5 MG/3ML) 0.083% nebulizer solution Inhale 2.5 mg into the lungs 3 (three) times daily as needed for shortness of breath or wheezing. 03/20/20   [provider]  ALPRAZolam Prudy Feeler) 0.5 MG tablet Take 0.5 mg by mouth 3 (three) times daily as needed for anxiety.    [provider]  apixaban (ELIQUIS) 5 MG TABS tablet Take 1 tablet (5 mg total) by mouth 2 (two) times daily. 01/20/23   Nahser, Deloris Ping, MD  atorvastatin (LIPITOR) 80 MG tablet Take 1 tablet (80 mg total) by mouth daily. 01/12/22   Nahser, Deloris Ping, MD  ezetimibe (ZETIA) 10 MG tablet Take 10 mg by mouth daily.    [provider]  Ferrous Sulfate Dried (SLOW RELEASE IRON)  45 MG TBCR Take by mouth.    [provider]  fluticasone-salmeterol (ADVAIR) 250-50 MCG/ACT AEPB Inhale 1 puff into the lungs 2 (two) times daily.    [provider]  furosemide (LASIX) 20 MG tablet Take 1 tablet (20 mg total) by mouth as needed for fluid or edema. 01/12/22   Nahser, Deloris Ping, MD  hydrocortisone (CORTEF) 5 MG tablet Take 1 tablet (5 mg total) by mouth daily. 07/26/22   Alwyn Ren, MD  isosorbide mononitrate (IMDUR) 30 MG 24 hr tablet Take 1 tablet (30 mg total) by mouth daily. 01/12/22   Nahser, Deloris Ping, MD  levothyroxine (SYNTHROID) 50 MCG tablet Take 50 mcg by mouth daily before breakfast.    [provider]  nitroGLYCERIN (NITROSTAT) 0.4 MG SL tablet Place 1 tablet (0.4 mg total) under the tongue every 5 (five) minutes as needed for chest pain. 02/20/23   Nahser, Deloris Ping, MD  oxyCODONE 10 MG TABS Take 1 tablet (10  mg total) by mouth 3 (three) times daily. Patient not taking: Reported on 11/08/2022 07/12/19   Lars Mage, PA-C  pantoprazole (PROTONIX) 40 MG tablet TAKE ONE TABLET BY MOUTH EVERY DAY 01/19/23   Nahser, Deloris Ping, MD  pramipexole (MIRAPEX) 0.25 MG tablet Take 0.25 mg by mouth at bedtime.  02/18/19   [provider]  promethazine (PHENERGAN) 12.5 MG tablet TAKE ONE TABLET BY MOUTH EVERY 6 HOURS AS NEEDED FOR NAUSEA AND VOMITING Patient taking differently: Take 12.5 mg by mouth as needed for vomiting or nausea. 11/06/19   Maeola Harman, MD  ranolazine (RANEXA) 1000 MG SR tablet Take 1 tablet (1,000 mg total) by mouth 2 (two) times daily. 04/26/23   Nahser, Deloris Ping, MD  sertraline (ZOLOFT) 100 MG tablet Take 150 mg by mouth at bedtime.     [provider]  Vitamin D, Ergocalciferol, (DRISDOL) 1.25 MG (50000 UNIT) CAPS capsule Take 50,000 Units by mouth every 7 (seven) days.    [provider]      Allergies    Benadryl [diphenhydramine], Penicillins, Adhesive [tape], Amoxicillin, Hydrocodone-acetaminophen, Cyclobenzaprine, and Hydrocodone    Review of Systems   Review of Systems  Respiratory:  Positive for shortness of breath.     Physical Exam Updated Vital Signs BP (!) 160/76   Pulse 92   Temp 99.4 F (37.4 C) (Oral)   Resp (!) 32   Ht 5\' 1"  (1.549 m)   Wt 54.4 kg   SpO2 100%   BMI 22.67 kg/m  Physical Exam Vitals and nursing note reviewed.  Cardiovascular:     Rate and Rhythm: Tachycardia present. Rhythm irregular.  Pulmonary:     Breath sounds: Wheezing present.     Comments: Somewhat diffuse harsh breath sounds. Abdominal:     Tenderness: There is no abdominal tenderness.  Skin:    Capillary Refill: Capillary refill takes less than 2 seconds.  Neurological:     Mental Status: She is alert.     ED Results / Procedures / Treatments   Labs (all labs ordered are listed, but only abnormal results are displayed) Labs Reviewed  RESP  PANEL BY RT-PCR (RSV, FLU A&B, COVID)  [id number] - Abnormal; Notable for the following components:      Result Value   SARS Coronavirus 2 by RT PCR POSITIVE (*)    All other components within normal limits  COMPREHENSIVE METABOLIC PANEL - Abnormal; Notable for the following components:   Calcium 8.0 (*)    Total  Protein 5.9 (*)    Albumin 2.8 (*)    All other components within normal limits  CBC WITH DIFFERENTIAL/PLATELET - Abnormal; Notable for the following components:   WBC 3.2 (*)    RBC 3.21 (*)    Hemoglobin 8.3 (*)    HCT 28.1 (*)    MCH 25.9 (*)    MCHC 29.5 (*)    RDW 17.2 (*)    Platelets 141 (*)    Lymphs Abs 0.4 (*)    All other components within normal limits  MAGNESIUM - Abnormal; Notable for the following components:   Magnesium 1.5 (*)    All other components within normal limits  BRAIN NATRIURETIC PEPTIDE - Abnormal; Notable for the following components:   B Natriuretic Peptide 595.1 (*)    All other components within normal limits    EKG EKG Interpretation Date/Time:  Sunday May 21 2023 19:25:29 EDT Ventricular Rate:  98 PR Interval:  164 QRS Duration:  88 QT Interval:  439 QTC Calculation: 561 R Axis:   75  Text Interpretation: Atrial fibrillation Probable anterior infarct, age indeterminate Prolonged QT interval Baseline wander in lead(s) V6 Confirmed by Benjiman Core [phone]) on 05/21/2023 7:51:58 PM  Radiology DG Chest Portable 1 View Result Date: 05/21/2023 CLINICAL DATA:  Short of breath, labored breathing, COPD EXAM: PORTABLE CHEST 1 VIEW COMPARISON:  07/24/2022 FINDINGS: Single frontal view of the chest demonstrates a stable cardiac silhouette. Postsurgical changes from CABG are again noted. There is mild vascular congestion without airspace disease, effusion, or pneumothorax. No acute bony abnormality. Postsurgical changes of the left humerus. IMPRESSION: 1. Mild vascular congestion without overt edema. Electronically Signed   By: Sharlet Salina  M.D.   On: 05/21/2023 18:56    Procedures Procedures    Medications Ordered in ED Medications  albuterol (VENTOLIN HFA) 108 (90 Base) MCG/ACT inhaler 4 puff (has no administration in time range)    ED Course/ Medical Decision Making/ A&P                                 Medical Decision Making Amount and/or Complexity of Data Reviewed Labs: ordered. Radiology: ordered.  Risk Prescription drug management.   Shortness of breath and cough.  History of atrial fibrillation.  Does have somewhat irregular beat now.  Will get viral testing.  Will get blood work.  Reviewed urgent care note.  Patient is positive for COVID.  Is in a rate controlled A-fib.  Does have mild vascular congestion but I think with the chest tightness that is more likely COPD and COVID causing the dyspnea.  Wheezing and dyspneic even at rest.  Feel she would benefit from mission to the hospital.  Feels more comfortable on a couple liters of oxygen.  Will discuss with hospitalist.        Final Clinical Impression(s) / ED Diagnoses Final diagnoses:  COVID-19  Dyspnea, unspecified type  COPD exacerbation [facility])    Rx / DC Orders ED Discharge Orders     None         Benjiman Core, MD 05/21/23 2048

## 2023-05-21 NOTE — ED Notes (Signed)
 Patient is being discharged from the Urgent Care and sent to the Emergency Department via [facility] . Per Juliet Rude NP, patient is in need of higher level of care due to A-fib with RVR and tachypnea. Patient is aware and verbalizes understanding of plan of care.  Vitals:   05/21/23 1656  BP: (!) 159/69  Pulse: (!) 102  Resp: (!) 48  SpO2: 100%

## 2023-05-21 NOTE — ED Notes (Signed)
 CCMD called and verified that patient on cardiac tele

## 2023-05-21 NOTE — ED Notes (Signed)
 [facility] RN CALLED AND NOTIFIED OF TRANSPORT.   [facility] CALLED AND IN ROUTE.

## 2023-05-21 NOTE — ED Provider Notes (Signed)
 KORIN SETZLER is a 73 y.o. female presenting for chief complaint of shortness of breath that started a few days ago.  Complex pulmonary and cardiac history including COPD, asthma, CABG, and atrial fibrillation. Using albuterol inhaler with minimal relief. Coughing up green sputum.  Respiratory rate 42 to 48 breaths/min. BP stable.  Oxygen on room air 92 to 93%. Lungs diminished. Cough is harsh and wet sounding. Heart rate between 98-120. EKG shows A-fib with RVR.  Recommend further workup and evaluation in the emergency department setting for rate control.  Patient placed on 2 L of oxygen via nasal cannula for comfort, oxygen saturation 100% with 2 L nasal cannula.  [facility] called with patient's consent for transport. Discharged to ED via [facility] in stable condition.   Carlisle Beers, [place] 05/21/23 1751

## 2023-05-21 NOTE — H&P (Signed)
 History and Physical    Wendy [name] ZOX:[id number] DOB: 1951-02-03 DOA: 05/21/2023  PCP: Corliss Blacker, MD  Patient coming from: Home  I have personally briefly reviewed patient's old medical records in [facility]  Chief Complaint: Shortness of breath  HPI: Wendy [name] is a 73 y.o. female with medical history significant for PAF on Eliquis, chronic HFpEF, CAD s/p CABG, COPD, PAD (s/p left femorofemoral and aortobifemoral bypass), HTN, HLD, hypothyroidism, depression/anxiety, OSA who presented to the ED for evaluation of shortness of breath.  Patient states that over the last 2 weeks she has been feeling fatigued with shortness of breath and cough.  She says she has had a lot of chest congestion and feeling like she needs to bring up sputum but has not been able.  She reports cold chills and body aches.  She has been using her home inhalers without significant improvement.  Due to persistent and progressively worsening symptoms she came to the ED for further evaluation.  ED Course  Labs/Imaging on admission: I have personally reviewed following labs and imaging studies.  Initial vitals showed BP 143/55, pulse 96, RR 20, temp 99.4 F, SpO2 100% on room air.  Labs showed WBC 3.2, ANC 2600, hemoglobin 8.3, platelets 141,000, sodium 136, potassium 4.3, bicarb 22, BUN 15, creatinine 0.82, serum glucose 92, LFTs within normal limits.  SARS-CoV-2 PCR is positive.  Influenza and RSV negative.  Portable chest x-ray showed mild vascular congestion without overt edema.  Patient was given albuterol inhaler.  The hospitalist service was consulted to admit.  Review of Systems: All systems reviewed and are negative except as documented in history of present illness above.   Past Medical History:  Diagnosis Date   Anemia    Anginal pain (HCC)    jaw pain 07/2016, s/p 07/15/16 nuclear stress test   Anxiety    Arthritis    "lower back" (08/24/2017)   Asthma    Chornic  Obstructive Pulmonary Disease    Chronic back pain    Chronic combined systolic and diastolic CHF    EF 5/18: 60-65 // EF 4/21: 55-60 // EF 4/21: 20-25   Chronic leg pain    Chronic lower back pain    Coronary Artery Disease    a. s/p NSTEMI >> CABG 1998 // s/p PCI 2013 // LHC 01/2015: DES to SVG-RCA.// Cath 05/2019: all grafts occluded; mod non-obs LAD disease w L-L and L-R collats - med Rx // post Ao-Bifem bypass MI c/b PEA, CGS requiring Impella >> anatomy unchanged - Med Rx   Depression    Fall 08/2016   Gastroesophageal reflux disease    Glucose intolerance    Headache    "a few/month" (08/24/2017)   HLD (hyperlipidemia)    Hx of DVT X 3   RLE   Hypertension    Myocardial infarction (HCC) 1999   OSA on CPAP    Paroxysmal atrial fibrillation    a. 09/2013 post-op b. 01/2015 // Apixaban // recurrent AFib post MI after Ao-Bifem in 06/2019 >> Amio Rx   Peripheral artery disease    s/p L fem-fem bypass // s/p Ao-Bifem bypass in 06/2019   Peripheral neuropathy    Pneumonia    Shortness of breath    when walking   Wears glasses     Past Surgical History:  Procedure Laterality Date   ABDOMINAL AORTOGRAM W/LOWER EXTREMITY Bilateral 04/29/2019   Procedure: ABDOMINAL AORTOGRAM W/LOWER EXTREMITY;  Surgeon: Maeola Harman, MD;  Location:  [facility];  Service: Cardiovascular;  Laterality: Bilateral;   ANTERIOR CERVICAL DECOMP/DISCECTOMY FUSION  1991   AORTA - BILATERAL FEMORAL ARTERY BYPASS GRAFT N/A 06/18/2019   Procedure: AORTA BIFEMORAL BYPASS GRAFT;  Surgeon: Maeola Harman, MD;  Location: [facility] OR;  Service: Vascular;  Laterality: N/A;   ARTERY REPAIR Left 06/30/2019   Procedure: LEFT BRACHIAL ARTERY REPAIR, EVACUATION OF HEMATOMA;  Surgeon: Nada Libman, MD;  Location: [facility] OR;  Service: Vascular;  Laterality: Left;   BACK SURGERY     CARDIAC CATHETERIZATION N/A 01/22/2015   Procedure: Left Heart Cath and Coronary Angiography;  Surgeon: Lennette Bihari, MD;   Location: [facility];  Service: Cardiovascular;  Laterality: N/A;   CARDIAC CATHETERIZATION N/A 01/22/2015   Procedure: Coronary Stent Intervention;  Surgeon: Lennette Bihari, MD;  Location: [facility];  Service: Cardiovascular;  Laterality: N/A;  3.0x12 xience prox SVG to RCA   CARDIAC CATHETERIZATION  03/09/2000   hx/notes 03/20/2000   CHOLECYSTECTOMY N/A 09/16/2013   Procedure: LAPAROSCOPIC CHOLECYSTECTOMY CONVERTED TO OPEN CHOLECYSTECTOMY WITH CHOLANGIOGRAM;  Surgeon: Shelly Rubenstein, MD;  Location: [facility] OR;  Service: General;  Laterality: N/A;   CORONARY ANGIOGRAPHY N/A 06/27/2019   Procedure: CORONARY ANGIOGRAPHY (CATH LAB);  Surgeon: Tonny Bollman, MD;  Location: [facility] INVASIVE CV LAB;  Service: Cardiovascular;  Laterality: N/A;   CORONARY ANGIOPLASTY WITH STENT PLACEMENT  11/2011, 02/2012, 01/2015   DES to SVG-RCA both times, In [place], [place], Dr. Unice Cobble   CORONARY ARTERY BYPASS GRAFT  1998   LIMA-LAD, SVG-OM, SVG-RCA   ERCP N/A 09/19/2013   Procedure: ENDOSCOPIC RETROGRADE CHOLANGIOPANCREATOGRAPHY (ERCP);  Surgeon: Louis Meckel, MD;  Location: [facility] ENDOSCOPY;  Service: Endoscopy;  Laterality: N/A;   FINGER SURGERY Right    ring finger "fused"   FRACTURE SURGERY     IRRIGATION AND DEBRIDEMENT ABSCESS Right 10/17/2013   Procedure: INCISION AND DRAINAGE RIGHT SUBCOSTAL WOUND;  Surgeon: Kandis Cocking, MD;  Location: [facility] ORS;  Service: General;  Laterality: Right;   JOINT REPLACEMENT     KNEE ARTHROSCOPY Right 1998   LEFT HEART CATH AND CORS/GRAFTS ANGIOGRAPHY N/A 07/25/2016   Procedure: Left Heart Cath and Cors/Grafts Angiography;  Surgeon: Corky Crafts, MD;  Location: [facility] INVASIVE CV LAB;  Service: Cardiovascular;  Laterality: N/A;   LEFT HEART CATH AND CORS/GRAFTS ANGIOGRAPHY N/A 06/05/2019   Procedure: LEFT HEART CATH AND CORS/GRAFTS ANGIOGRAPHY;  Surgeon: Kathleene Hazel, MD;  Location: [facility];  Service: Cardiovascular;  Laterality: N/A;    LEFT HEART CATHETERIZATION WITH CORONARY/GRAFT ANGIOGRAM N/A 07/19/2013   Procedure: LEFT HEART CATHETERIZATION WITH Isabel Caprice;  Surgeon: Marykay Lex, MD;  Location: [facility] CATH LAB;  Service: Cardiovascular;  Laterality: N/A;   MASS EXCISION Right 06/25/2014   Procedure: EXCISION BUTTOCK MASS ;  Surgeon: Abigail Miyamoto, MD;  Location: [facility];  Service: General;  Laterality: Right;   ORIF HUMERUS FRACTURE Left 08/27/2013   Procedure: OPEN REDUCTION INTERNAL FIXATION (ORIF) LEFT HUMERUS ;  Surgeon: Budd Palmer, MD;  Location: [facility] OR;  Service: Orthopedics;  Laterality: Left;   PLACEMENT OF IMPELLA LEFT VENTRICULAR ASSIST DEVICE N/A 06/24/2019   Procedure: PLACEMENT OF IMPELLA LEFT VENTRICULAR ASSIST DEVICE;  Surgeon: Linden Dolin, MD;  Location: [facility] OR;  Service: Open Heart Surgery;  Laterality: N/A;   POSTERIOR FUSION CERVICAL SPINE  1992   REMOVAL OF IMPELLA LEFT VENTRICULAR ASSIST DEVICE Right 06/28/2019   Procedure: REMOVAL OF IMPELLA LEFT VENTRICULAR  ASSIST DEVICE;  Surgeon: Linden Dolin, MD;  Location: [facility] OR;  Service: Open Heart Surgery;  Laterality: Right;   THROMBECTOMY / EMBOLECTOMY FEMORAL ARTERY Right    hx/notes 03/20/2000   TOTAL HIP ARTHROPLASTY Right 07/19/2016   Procedure: TOTAL HIP ARTHROPLASTY ANTERIOR APPROACH;  Surgeon: Sheral Apley, MD;  Location: [facility] OR;  Service: Orthopedics;  Laterality: Right;   TOTAL HIP REVISION Right 08/22/2017   Procedure: TOTAL HIP REVISION;  Surgeon: Sheral Apley, MD;  Location: [facility] OR;  Service: Orthopedics;  Laterality: Right;    Social History: Patient states she quit smoking years ago.  She denies alcohol use.  Allergies  Allergen Reactions   Benadryl [Diphenhydramine] Shortness Of Breath   Penicillins Diarrhea    Did it involve swelling of the face/tongue/throat, SOB, or low BP? No Did it involve sudden or severe rash/hives, skin peeling, or any reaction on the inside of your mouth or nose?  No Did you need to seek medical attention at a hospital or doctor's office? No When did it last happen?      2 months If all above answers are "NO", may proceed with cephalosporin use.   Adhesive [Tape] Other (See Comments)    Burning of skin   Amoxicillin Diarrhea and Other (See Comments)    Has patient had a PCN reaction causing immediate rash, facial/tongue/throat swelling, SOB or lightheadedness with hypotension: No Has patient had a PCN reaction causing severe rash involving mucus membranes or skin necrosis: No Has patient had a PCN reaction that required hospitalization No Has patient had a PCN reaction occurring within the last 10 years: Yes -- noted rxn as diarrhea If all of the above answers are "NO", then may proceed with Cephalosporin use.    Hydrocodone-Acetaminophen Itching and Nausea And Vomiting   Cyclobenzaprine Other (See Comments)    Causes restless leg syndrome Restless syndrome   Hydrocodone Itching    Family History  Problem Relation Age of Onset   Lung cancer Mother    Clotting disorder Father    Lung cancer Sister    Heart disease Brother    Heart disease Brother    Colon cancer Neg Hx    Breast cancer Neg Hx      Prior to Admission medications   Medication Sig Start Date End Date Taking? Authorizing Provider  acetaminophen (TYLENOL) 500 MG tablet Take 500-1,000 mg by mouth every 6 (six) hours as needed (for pain.).    [provider]  albuterol (PROVENTIL HFA;VENTOLIN HFA) 108 (90 Base) MCG/ACT inhaler Inhale 2 puffs into the lungs every 6 (six) hours as needed for wheezing or shortness of breath. 01/01/18   Bhagat, Sharrell Ku, PA  albuterol (PROVENTIL) (2.5 MG/3ML) 0.083% nebulizer solution Inhale 2.5 mg into the lungs 3 (three) times daily as needed for shortness of breath or wheezing. 03/20/20   [provider]  ALPRAZolam Prudy Feeler) 0.5 MG tablet Take 0.5 mg by mouth 3 (three) times daily as needed for anxiety.    [provider]  apixaban (ELIQUIS) 5 MG TABS tablet Take 1 tablet (5 mg total) by mouth 2 (two) times daily. 01/20/23   Nahser, Deloris Ping, MD  atorvastatin (LIPITOR) 80 MG tablet Take 1 tablet (80 mg total) by mouth daily. 01/12/22   Nahser, Deloris Ping, MD  ezetimibe (ZETIA) 10 MG tablet Take 10 mg by mouth daily.    [provider]  Ferrous Sulfate Dried (SLOW RELEASE IRON) 45 MG TBCR Take by mouth.  [provider]  fluticasone-salmeterol (ADVAIR) 250-50 MCG/ACT AEPB Inhale 1 puff into the lungs 2 (two) times daily.    [provider]  furosemide (LASIX) 20 MG tablet Take 1 tablet (20 mg total) by mouth as needed for fluid or edema. 01/12/22   Nahser, Deloris Ping, MD  hydrocortisone (CORTEF) 5 MG tablet Take 1 tablet (5 mg total) by mouth daily. 07/26/22   Alwyn Ren, MD  isosorbide mononitrate (IMDUR) 30 MG 24 hr tablet Take 1 tablet (30 mg total) by mouth daily. 01/12/22   Nahser, Deloris Ping, MD  levothyroxine (SYNTHROID) 50 MCG tablet Take 50 mcg by mouth daily before breakfast.    [provider]  nitroGLYCERIN (NITROSTAT) 0.4 MG SL tablet Place 1 tablet (0.4 mg total) under the tongue every 5 (five) minutes as needed for chest pain. 02/20/23   Nahser, Deloris Ping, MD  oxyCODONE 10 MG TABS Take 1 tablet (10 mg total) by mouth 3 (three) times daily. Patient not taking: Reported on 11/08/2022 07/12/19   Lars Mage, PA-C  pantoprazole (PROTONIX) 40 MG tablet TAKE ONE TABLET BY MOUTH EVERY DAY 01/19/23   Nahser, Deloris Ping, MD  pramipexole (MIRAPEX) 0.25 MG tablet Take 0.25 mg by mouth at bedtime.  02/18/19   [provider]  promethazine (PHENERGAN) 12.5 MG tablet TAKE ONE TABLET BY MOUTH EVERY 6 HOURS AS NEEDED FOR NAUSEA AND VOMITING Patient taking differently: Take 12.5 mg by mouth as needed for vomiting or nausea. 11/06/19   Maeola Harman, MD  ranolazine (RANEXA) 1000 MG SR tablet Take 1 tablet (1,000 mg total) by mouth 2 (two) times daily. 04/26/23    Nahser, Deloris Ping, MD  sertraline (ZOLOFT) 100 MG tablet Take 150 mg by mouth at bedtime.     [provider]  Vitamin D, Ergocalciferol, (DRISDOL) 1.25 MG (50000 UNIT) CAPS capsule Take 50,000 Units by mouth every 7 (seven) days.    [provider]    Physical Exam: Vitals:   05/21/23 2130 05/21/23 2200 05/21/23 2224 05/21/23 2230  BP: (!) 140/79 132/73  131/64  Pulse: 99 96  90  Resp: (!) 35 19  (!) 27  Temp:   (!) 100.6 F (38.1 C)   TempSrc:      SpO2: 96% 100%  98%  Weight:      Height:       Constitutional: Right in bed in the right lateral decubitus position.  Appears fatigued.  NAD, calm. Eyes: EOMI, lids and conjunctivae normal ENMT: Mucous membranes are moist. Posterior pharynx clear of any exudate or lesions.Normal dentition.  Neck: normal, supple, no masses. Respiratory: Expiratory wheezing throughout.  Normal respiratory effort. No accessory muscle use.  Cardiovascular: Irregularly irregular, no murmurs / rubs / gallops. No extremity edema. 2+ pedal pulses. Abdomen: no tenderness, no masses palpated. Musculoskeletal: no clubbing / cyanosis. No joint deformity upper and lower extremities. Good ROM, no contractures. Normal muscle tone.  Skin: no rashes, lesions, ulcers. No induration Neurologic: Sensation intact. Strength 5/5 in all 4.  Psychiatric: Alert and oriented x 3. Normal mood.   EKG: Personally reviewed. A-fib, rate 98, QTc 561.  Nonspecific ST change V6.  Bradycardia has resolved when compared to previous.  Assessment/Plan Principal Problem:   COPD with acute exacerbation (HCC) Active Problems:   COVID-19 virus infection   PAF (paroxysmal atrial fibrillation) (HCC)   (HFpEF) heart failure with preserved ejection fraction (HCC)   Coronary artery disease involving native coronary artery of native heart with angina  pectoris (HCC)   Essential hypertension   HLD (hyperlipidemia)   PAD (peripheral artery disease) (HCC)   Anxiety   Severe  major depression, single episode, without psychotic features (HCC)   Restless leg syndrome   Sleep apnea   Hypomagnesemia   Leukopenia   Anemia of chronic disease   Wendy [name] is a 73 y.o. female with medical history significant for PAF on Eliquis, chronic HFpEF, CAD s/p CABG, COPD, PAD (s/p left femorofemoral and aortobifemoral bypass), HTN, HLD, hypothyroidism, depression/anxiety, OSA who is admitted with acute COPD exacerbation in setting of SARS-CoV-2 infection.  Assessment and Plan: COPD with acute exacerbation in setting of COVID-19 viral infection: -IV Solu-Medrol 40 mg twice daily -Start scheduled Brovana/Pulmicort, DuoNebs as needed -Saturating well on room air, continue supplemental O2 via [place] as needed  Paroxysmal atrial fibrillation: A-fib on admission with controlled rate.  Has prior history of bradycardia and is not on rate controlling meds.  Continue Eliquis.  Chronic HFpEF: Appears euvolemic.  Last EF 55-60%.  Uses Lasix as needed.  Leukopenia: Without neutropenia.  This is likely secondary to viral infection.  Repeat labs in AM.  Hypomagnesemia: Supplementing.  Anemia of chronic disease: Hemoglobin 8.3, decreased from baseline.  No obvious bleeding.  Monitor.  CAD s/p CABG: Stable.  Denies chest pain.  Continue Imdur, Ranexa, Eliquis, atorvastatin, Zetia.  Hypertension: Continue Imdur.  Hyperlipidemia: Continue atorvastatin and Zetia.  Hypothyroidism: Continue Synthroid.  PAD: Continue Eliquis, atorvastatin, Zetia.  Depression/anxiety: Continue Wellbutrin, Zoloft, and Xanax prn.  RLS: Continue Mirapex.  OSA: Patient states that she has not been using CPAP for years after her machine broke.   DVT prophylaxis:  apixaban (ELIQUIS) tablet 5 mg   Code Status:   Code Status: Do not attempt resuscitation (DNR) PRE-ARREST INTERVENTIONS DESIRED confirmed with patient on admission. Family Communication: None present on admission Disposition Plan:  From home and likely discharge to home pending clinical progress Consults called: None Severity of Illness: The appropriate patient status for this patient is OBSERVATION. Observation status is judged to be reasonable and necessary in order to provide the required intensity of service to ensure the patient's safety. The patient's presenting symptoms, physical exam findings, and initial radiographic and laboratory data in the context of their medical condition is felt to place them at decreased risk for further clinical deterioration. Furthermore, it is anticipated that the patient will be medically stable for discharge from the hospital within 2 midnights of admission.   Darreld Mclean MD [facility]  If 7PM-7AM, please contact night-coverage [email or web address]  05/21/2023, 10:47 PM

## 2023-05-21 NOTE — ED Triage Notes (Signed)
 Patient presenting with SOB, labored breathing with accessory ,muscle use, weakness, chills, and light headed. Patient has history of COPD, Asthma. Denies any sick symptoms or exposure. Using Albuterol and Flovent inhaler with no relief.   Provider at bedside to triage.

## 2023-05-22 ENCOUNTER — Other Ambulatory Visit: Payer: Self-pay | Admitting: Cardiovascular Disease

## 2023-05-22 DIAGNOSIS — Z79890 Hormone replacement therapy (postmenopausal): Secondary | ICD-10-CM | POA: Diagnosis not present

## 2023-05-22 DIAGNOSIS — F322 Major depressive disorder, single episode, severe without psychotic features: Secondary | ICD-10-CM | POA: Diagnosis present

## 2023-05-22 DIAGNOSIS — D696 Thrombocytopenia, unspecified: Secondary | ICD-10-CM | POA: Diagnosis present

## 2023-05-22 DIAGNOSIS — Z8249 Family history of ischemic heart disease and other diseases of the circulatory system: Secondary | ICD-10-CM | POA: Diagnosis not present

## 2023-05-22 DIAGNOSIS — D638 Anemia in other chronic diseases classified elsewhere: Secondary | ICD-10-CM | POA: Diagnosis present

## 2023-05-22 DIAGNOSIS — I5042 Chronic combined systolic (congestive) and diastolic (congestive) heart failure: Secondary | ICD-10-CM | POA: Diagnosis present

## 2023-05-22 DIAGNOSIS — Z96641 Presence of right artificial hip joint: Secondary | ICD-10-CM | POA: Diagnosis present

## 2023-05-22 DIAGNOSIS — G2581 Restless legs syndrome: Secondary | ICD-10-CM | POA: Diagnosis present

## 2023-05-22 DIAGNOSIS — J441 Chronic obstructive pulmonary disease with (acute) exacerbation: Secondary | ICD-10-CM | POA: Diagnosis present

## 2023-05-22 DIAGNOSIS — D72819 Decreased white blood cell count, unspecified: Secondary | ICD-10-CM | POA: Diagnosis present

## 2023-05-22 DIAGNOSIS — Z86718 Personal history of other venous thrombosis and embolism: Secondary | ICD-10-CM | POA: Diagnosis not present

## 2023-05-22 DIAGNOSIS — Z7901 Long term (current) use of anticoagulants: Secondary | ICD-10-CM | POA: Diagnosis not present

## 2023-05-22 DIAGNOSIS — I480 Paroxysmal atrial fibrillation: Secondary | ICD-10-CM | POA: Diagnosis present

## 2023-05-22 DIAGNOSIS — E039 Hypothyroidism, unspecified: Secondary | ICD-10-CM | POA: Diagnosis present

## 2023-05-22 DIAGNOSIS — Z955 Presence of coronary angioplasty implant and graft: Secondary | ICD-10-CM | POA: Diagnosis not present

## 2023-05-22 DIAGNOSIS — U071 COVID-19: Secondary | ICD-10-CM | POA: Diagnosis present

## 2023-05-22 DIAGNOSIS — I739 Peripheral vascular disease, unspecified: Secondary | ICD-10-CM | POA: Diagnosis present

## 2023-05-22 DIAGNOSIS — Z951 Presence of aortocoronary bypass graft: Secondary | ICD-10-CM | POA: Diagnosis not present

## 2023-05-22 DIAGNOSIS — I110 Hypertensive heart disease with heart failure: Secondary | ICD-10-CM | POA: Diagnosis present

## 2023-05-22 DIAGNOSIS — E785 Hyperlipidemia, unspecified: Secondary | ICD-10-CM | POA: Diagnosis present

## 2023-05-22 DIAGNOSIS — Z7951 Long term (current) use of inhaled steroids: Secondary | ICD-10-CM | POA: Diagnosis not present

## 2023-05-22 DIAGNOSIS — Z66 Do not resuscitate: Secondary | ICD-10-CM | POA: Diagnosis present

## 2023-05-22 DIAGNOSIS — Z87891 Personal history of nicotine dependence: Secondary | ICD-10-CM | POA: Diagnosis not present

## 2023-05-22 LAB — CBC WITH DIFFERENTIAL/PLATELET
Abs Immature Granulocytes: 0.02 10*3/uL (ref 0.00–0.07)
Basophils Absolute: 0 10*3/uL (ref 0.0–0.1)
Basophils Relative: 0 %
Eosinophils Absolute: 0 10*3/uL (ref 0.0–0.5)
Eosinophils Relative: 0 %
HCT: 29.1 % — ABNORMAL LOW (ref 36.0–46.0)
Hemoglobin: 8.6 g/dL — ABNORMAL LOW (ref 12.0–15.0)
Immature Granulocytes: 1 %
Lymphocytes Relative: 9 %
Lymphs Abs: 0.2 10*3/uL — ABNORMAL LOW (ref 0.7–4.0)
MCH: 26.1 pg (ref 26.0–34.0)
MCHC: 29.6 g/dL — ABNORMAL LOW (ref 30.0–36.0)
MCV: 88.4 fL (ref 80.0–100.0)
Monocytes Absolute: 0.1 10*3/uL (ref 0.1–1.0)
Monocytes Relative: 2 %
Neutro Abs: 2.4 10*3/uL (ref 1.7–7.7)
Neutrophils Relative %: 88 %
Platelets: 139 10*3/uL — ABNORMAL LOW (ref 150–400)
RBC: 3.29 MIL/uL — ABNORMAL LOW (ref 3.87–5.11)
RDW: 17.1 % — ABNORMAL HIGH (ref 11.5–15.5)
WBC: 2.8 10*3/uL — ABNORMAL LOW (ref 4.0–10.5)
nRBC: 0 % (ref 0.0–0.2)

## 2023-05-22 LAB — BASIC METABOLIC PANEL
Anion gap: 7 (ref 5–15)
BUN: 13 mg/dL (ref 8–23)
CO2: 20 mmol/L — ABNORMAL LOW (ref 22–32)
Calcium: 7.7 mg/dL — ABNORMAL LOW (ref 8.9–10.3)
Chloride: 105 mmol/L (ref 98–111)
Creatinine, Ser: 0.69 mg/dL (ref 0.44–1.00)
GFR, Estimated: >60 mL/min (ref >60)
Glucose, Bld: 119 mg/dL — ABNORMAL HIGH (ref 70–99)
Potassium: 4 mmol/L (ref 3.5–5.1)
Sodium: 132 mmol/L — ABNORMAL LOW (ref 135–145)

## 2023-05-22 MED ORDER — GABAPENTIN 300 MG PO CAPS
300.0000 mg | ORAL_CAPSULE | Freq: Every day | ORAL | Status: DC | PRN
  Administered 2023-05-23: 300 mg via ORAL
  Filled 2023-05-22 (×2): qty 1

## 2023-05-22 MED ORDER — NITROGLYCERIN 0.4 MG SL SUBL: 0.4000 mg | SUBLINGUAL_TABLET | SUBLINGUAL | Status: DC | PRN

## 2023-05-22 MED ORDER — KETOTIFEN FUMARATE 0.035 % OP SOLN: 1.0000 [drp] | Freq: Two times a day (BID) | OPHTHALMIC | Status: DC | PRN

## 2023-05-22 MED ORDER — FUROSEMIDE 20 MG PO TABS
20.0000 mg | ORAL_TABLET | Freq: Every day | ORAL | Status: DC
  Administered 2023-05-22 – 2023-05-26 (×5): 20 mg via ORAL
  Filled 2023-05-22 (×5): qty 1

## 2023-05-22 MED ORDER — SODIUM CHLORIDE 0.9 % IV BOLUS
250.0000 mL | Freq: Once | INTRAVENOUS | Status: AC
  Administered 2023-05-22: 250 mL via INTRAVENOUS

## 2023-05-22 NOTE — Progress Notes (Signed)
 PROGRESS NOTE  Wendy [name] QVZ:[id number] DOB: 01/27/1951 DOA: 05/21/2023 PCP: Corliss Blacker, MD   LOS: 0 days   Brief narrative:  Wendy [name] is a 73 y.o. female with medical history significant for paroxysmal atrial fibrillation on Eliquis, chronic HFpEF, CAD s/p CABG, COPD, PAD (s/p left femorofemoral and aortobifemoral bypass), hypertension, hyperlipidemia, hypothyroidism, depression/anxiety, OSA presented to hospital with shortness of breath for 2 weeks with fatigue cough congestion, chills and bodyaches.  She was using her inhalers without improvement.  Due to persistent symptoms he presented to the ED when she was noted to have a temperature of 99.4 F, was slightly tachypneic and pulse ox was 100% on room air.  Labs showed mild leukopenia with mild thrombocytopenia.  Chemistry was within normal limits.  Patient tested positive for COVID, influenza and RSV was negative.  Chest x-ray showed mild vascular congestion.  Patient was given albuterol inhaler and was considered for admission to the hospital for further evaluation and treatment.      Assessment/Plan: Principal Problem:   COPD with acute exacerbation (HCC) Active Problems:   COVID-19 virus infection   PAF (paroxysmal atrial fibrillation) (HCC)   (HFpEF) heart failure with preserved ejection fraction (HCC)   Coronary artery disease involving native coronary artery of native heart with angina pectoris (HCC)   Essential hypertension   HLD (hyperlipidemia)   PAD (peripheral artery disease) (HCC)   Anxiety   Severe major depression, single episode, without psychotic features (HCC)   Restless leg syndrome   Sleep apnea   Hypomagnesemia   Leukopenia   Anemia of chronic disease  COPD with acute exacerbation in setting of COVID-19 viral infection: Continue Brovana Pulmicort DuoNebs Solu-Medrol.  Continue to monitor closely.  Currently on 3 L of oxygen by nasal cannula.  Will continue to wean oxygen as  able.  Paroxysmal atrial fibrillation: Controlled.  Not on rate controlling medication.  Continue Eliquis.   Chronic HFpEF: Appears euvolemic.  Last EF 55-60%.  Continue low-dose Lasix 20 daily.   Leukopenia: Without neutropenia.  This is likely secondary to viral infection.  Monitor CBC   Hypomagnesemia: Magnesium 1.5 on presentation.  Has been replenished.  Check levels in AM.   Anemia of chronic disease: Hemoglobin 8.3 condition.  Repeat hemoglobin today at 8.6.  Will continue to monitor.  No obvious evidence of bleeding.   CAD s/p CABG: Plan.  Continue Imdur, Ranexa, Eliquis, atorvastatin, Zetia.   Hypertension: Continue Imdur.  Latest blood pressure of 108/61.   Hyperlipidemia: Continue atorvastatin and Zetia.   Hypothyroidism: Continue Synthroid.   PAD: Continue Eliquis, atorvastatin, Zetia.   Depression/anxiety: Continue Wellbutrin, Zoloft, and Xanax prn.   Restless leg syndrome Continue Mirapex.   OSA: Patient states that she has not been using CPAP for years after her machine broke.  Debility weakness.  Will get PT evaluation.  DVT prophylaxis:  apixaban (ELIQUIS) tablet 5 mg   Disposition: Home likely in 1 to 2 days  Status is: Observation The patient will require care spanning > 2 midnights and should be moved to inpatient because: Hypoxia, COVID infection, debility, pending clinical improvement    Code Status:     Code Status: Do not attempt resuscitation (DNR) PRE-ARREST INTERVENTIONS DESIRED  Family Communication: None at bedside  Consultants: None  Procedures: None  Anti-infectives:  None  Anti-infectives (From admission, onward)    None        Subjective: Today, patient was seen and examined at bedside.  Patient complains of cough, weakness  and shortness of breath.  Denies any nausea, vomiting fever or chills.  Complains of generalized weakness and fatigue.  Objective: Vitals:   05/22/23 0945 05/22/23 1008  BP: 112/87  (!) 100/57  Pulse: 76 70  Resp:  (!) 23  Temp:  97.6 F (36.4 C)  SpO2: 99% 100%   No intake or output data in the 24 hours ending 05/22/23 1226 Filed Weights   05/21/23 1809  Weight: 54.4 kg   Body mass index is 22.67 kg/m.   Physical Exam:  GENERAL: Patient is alert awake and oriented. Not in obvious distress.  On 3 L of oxygen by nasal cannula, appears weak and deconditioned HENT: No scleral pallor or icterus. Pupils equally reactive to light. Oral mucosa is moist NECK: is supple, no gross swelling noted. CHEST: Coarse secretions noted.  Diminished breath sounds bilaterally.  Expiratory wheezes CVS: S1 and S2 heard, irregular rhythm ABDOMEN: Soft, non-tender, bowel sounds are present. EXTREMITIES: No edema. CNS: Cranial nerves are intact.  No focal weakness but generalized weakness noted SKIN: warm and dry without rashes.  Data Review: I have personally reviewed the following laboratory data and studies,  CBC: Recent Labs  Lab 05/21/23 1827 05/22/23 0420  WBC 3.2* 2.8*  NEUTROABS 2.6 2.4  HGB 8.3* 8.6*  HCT 28.1* 29.1*  MCV 87.5 88.4  PLT 141* 139*   Basic Metabolic Panel: Recent Labs  Lab 05/21/23 1827 05/22/23 0420  NA 136 132*  K 4.3 4.0  CL 104 105  CO2 22 20*  GLUCOSE 92 119*  BUN 15 13  CREATININE 0.82 0.69  CALCIUM 8.0* 7.7*  MG 1.5*  --    Liver Function Tests: Recent Labs  Lab 05/21/23 1827  AST 23  ALT 18  ALKPHOS 52  BILITOT 0.5  PROT 5.9*  ALBUMIN 2.8*   No results for input(s): "LIPASE", "AMYLASE" in the last 168 hours. No results for input(s): "AMMONIA" in the last 168 hours. Cardiac Enzymes: No results for input(s): "CKTOTAL", "CKMB", "CKMBINDEX", "TROPONINI" in the last 168 hours. BNP (last 3 results) Recent Labs    05/21/23 1827  BNP 595.1*    ProBNP (last 3 results) No results for input(s): "PROBNP" in the last 8760 hours.  CBG: No results for input(s): "GLUCAP" in the last 168 hours. Recent Results (from the past  240 hours)  Resp panel by RT-PCR (RSV, Flu A&B, Covid) Anterior Nasal Swab     Status: Abnormal   Collection Time: 05/21/23  6:27 PM   Specimen: Anterior Nasal Swab  Result Value Ref Range Status   SARS Coronavirus 2 by RT PCR POSITIVE (A) NEGATIVE Final   Influenza A by PCR NEGATIVE NEGATIVE Final   Influenza B by PCR NEGATIVE NEGATIVE Final    Comment: (NOTE) The Xpert Xpress SARS-CoV-2/FLU/RSV plus assay is intended as an aid in the diagnosis of influenza from Nasopharyngeal swab specimens and should not be used as a sole basis for treatment. Nasal washings and aspirates are unacceptable for Xpert Xpress SARS-CoV-2/FLU/RSV testing.  Fact Sheet for Patients: BloggerCourse.com  Fact Sheet for Healthcare Providers: SeriousBroker.it  This test is not yet approved or cleared by the [place] FDA and has been authorized for detection and/or diagnosis of SARS-CoV-2 by FDA under an Emergency Use Authorization (EUA). This EUA will remain in effect (meaning this test can be used) for the duration of the COVID-19 declaration under Section 564(b)(1) of the Act, 21 U.S.C. section 360bbb-3(b)(1), unless the authorization is terminated or revoked.  Resp Syncytial Virus by PCR NEGATIVE NEGATIVE Final    Comment: (NOTE) Fact Sheet for Patients: BloggerCourse.com  Fact Sheet for Healthcare Providers: SeriousBroker.it  This test is not yet approved or cleared by the [place] FDA and has been authorized for detection and/or diagnosis of SARS-CoV-2 by FDA under an Emergency Use Authorization (EUA). This EUA will remain in effect (meaning this test can be used) for the duration of the COVID-19 declaration under Section 564(b)(1) of the Act, 21 U.S.C. section 360bbb-3(b)(1), unless the authorization is terminated or revoked.  Performed at [facility], [address]  [place], [place] [address]      Studies: DG Chest Portable 1 View Result Date: 05/21/2023 CLINICAL DATA:  Short of breath, labored breathing, COPD EXAM: PORTABLE CHEST 1 VIEW COMPARISON:  07/24/2022 FINDINGS: Single frontal view of the chest demonstrates a stable cardiac silhouette. Postsurgical changes from CABG are again noted. There is mild vascular congestion without airspace disease, effusion, or pneumothorax. No acute bony abnormality. Postsurgical changes of the left humerus. IMPRESSION: 1. Mild vascular congestion without overt edema. Electronically Signed   By: Sharlet Salina M.D.   On: 05/21/2023 18:56      Joycelyn Das, MD  [facility] 05/22/2023  If 7PM-7AM, please contact night-coverage

## 2023-05-22 NOTE — ED Notes (Signed)
 Pt incontinent of urine and stool. Cleaned up and new linen applied. Call bell in reach for pt

## 2023-05-23 DIAGNOSIS — J441 Chronic obstructive pulmonary disease with (acute) exacerbation: Secondary | ICD-10-CM | POA: Diagnosis not present

## 2023-05-23 LAB — CBC
HCT: 28.8 % — ABNORMAL LOW (ref 36.0–46.0)
Hemoglobin: 8.6 g/dL — ABNORMAL LOW (ref 12.0–15.0)
MCH: 25.7 pg — ABNORMAL LOW (ref 26.0–34.0)
MCHC: 29.9 g/dL — ABNORMAL LOW (ref 30.0–36.0)
MCV: 86 fL (ref 80.0–100.0)
Platelets: 155 10*3/uL (ref 150–400)
RBC: 3.35 MIL/uL — ABNORMAL LOW (ref 3.87–5.11)
RDW: 16.6 % — ABNORMAL HIGH (ref 11.5–15.5)
WBC: 3 10*3/uL — ABNORMAL LOW (ref 4.0–10.5)
nRBC: 0 % (ref 0.0–0.2)

## 2023-05-23 LAB — BASIC METABOLIC PANEL
Anion gap: 11 (ref 5–15)
BUN: 24 mg/dL — ABNORMAL HIGH (ref 8–23)
CO2: 21 mmol/L — ABNORMAL LOW (ref 22–32)
Calcium: 8.1 mg/dL — ABNORMAL LOW (ref 8.9–10.3)
Chloride: 105 mmol/L (ref 98–111)
Creatinine, Ser: 0.9 mg/dL (ref 0.44–1.00)
GFR, Estimated: >60 mL/min (ref >60)
Glucose, Bld: 125 mg/dL — ABNORMAL HIGH (ref 70–99)
Potassium: 4.3 mmol/L (ref 3.5–5.1)
Sodium: 137 mmol/L (ref 135–145)

## 2023-05-23 LAB — MAGNESIUM: Magnesium: 2 mg/dL (ref 1.7–2.4)

## 2023-05-23 NOTE — Evaluation (Signed)
 Physical Therapy Evaluation Patient Details Name: Wendy [name] MRN: [id number] DOB: 03-17-1950 Today's Date: 05/23/2023  History of Present Illness  Wendy [name] is a 73 y.o. F admitted 05/21/23 with SOB, cough, congestion, chills, and body aches for past 2 weeks. SARS-CoV-2 PCR is positive. Chest x-Wendy showed mild vascular congestion. PMH significant for paroxysmal atrial fibrillation on Eliquis, chronic HFpEF, CAD s/p CABG, COPD, PAD (s/p left femorofemoral and aortobifemoral bypass), hypertension, hyperlipidemia, hypothyroidism, depression/anxiety, OSA.   Clinical Impression  Pt admitted with above diagnosis. PTA, pt was independent with functional mobility and ADLs without an AD. She intends to d/c to a friend's home that is one story with 5 STE. Pt currently with functional limitations due to the deficits listed below (see PT Problem List). She is below baseline demonstrated by decreased activity tolerance,  requirement of supplemental oxygen, and generalized weakness. Pt will benefit from acute skilled PT to increase her strength, titrate oxygen, and maximize independence and safety with functional mobility. Given the limited physical assistance (supervision-CGA without AD) the pt currently needs and availability of 24/7 supervision/assist recommend Home with HHPT.      If plan is discharge home, recommend the following: A little help with walking and/or transfers;A little help with bathing/dressing/bathroom;Assistance with cooking/housework;Help with stairs or ramp for entrance;Assist for transportation   Can travel by private vehicle        Equipment Recommendations Rollator (4 wheels)  Recommendations for Other Services       Functional Status Assessment Patient has had a recent decline in their functional status and demonstrates the ability to make significant improvements in function in a reasonable and predictable amount of time.     Precautions / Restrictions  Precautions Precautions: Fall Recall of Precautions/Restrictions: Intact Restrictions Weight Bearing Restrictions Per Provider Order: No      Mobility  Bed Mobility Overal bed mobility: Needs Assistance Bed Mobility: Supine to Sit     Supine to sit: HOB elevated, Used rails, Supervision     General bed mobility comments: Pt sat up on the R side of bed using handrail and HOB elevated to ~20deg.    Transfers Overall transfer level: Needs assistance Equipment used: None Transfers: Sit to/from Stand, Bed to chair/wheelchair/BSC Sit to Stand: Contact guard assist   Step pivot transfers: Contact guard assist       General transfer comment: Pt stood from lowest bed height without an AD. Good eccentric control.    Ambulation/Gait Ambulation/Gait assistance: Contact guard assist Gait Distance (Feet): 65 Feet Assistive device: None Gait Pattern/deviations: Decreased stride length, Step-to pattern, Wide base of support Gait velocity: reduced Gait velocity interpretation: <1.31 ft/sec, indicative of household ambulator   General Gait Details: Pt ambulated back and forth to the door and then around the bed to the recliner chair, as she declined exiting the room. Her steps were short and slow, good foot clearence, even weight shift, and equal arm swing. PT managed lines/leads.  Stairs            Wheelchair Mobility     Tilt Bed    Modified Rankin (Stroke Patients Only)       Balance Overall balance assessment: Mild deficits observed, not formally tested                                           Pertinent Vitals/Pain Pain Assessment Pain Assessment: Faces Faces  Pain Scale: Hurts little more Pain Location: Chest Pain Descriptors / Indicators: Discomfort, Sore Pain Intervention(s): Monitored during session    Home Living Family/patient expects to be discharged to:: Private residence Living Arrangements: Non-relatives/Friends Available Help  at Discharge: Friend(s);Available 24 hours/day Type of Home: House Home Access: Stairs to enter Entrance Stairs-Rails: Right;Left;Can reach both Entrance Stairs-Number of Steps: 5   Home Layout: One level Home Equipment: None      Prior Function Prior Level of Function : Independent/Modified Independent;History of Falls (last six months)             Mobility Comments: Pt ambulates without AD and is able to perform stairs well with railing. She notes several falls over the past 6 months but unable to put a number on how many. ADLs Comments: Indep with ADLs. Manages her own medications.     Extremity/Trunk Assessment   Upper Extremity Assessment Upper Extremity Assessment: Generalized weakness    Lower Extremity Assessment Lower Extremity Assessment: Generalized weakness    Cervical / Trunk Assessment Cervical / Trunk Assessment: Normal  Communication   Communication Communication: No apparent difficulties    Cognition Arousal: Alert Behavior During Therapy: WFL for tasks assessed/performed   PT - Cognitive impairments: No apparent impairments                       PT - Cognition Comments: Pt A,Ox4 Following commands: Intact       Cueing Cueing Techniques: Verbal cues     General Comments General comments (skin integrity, edema, etc.): VSS on 3L. Pt with productive cough twice during session.    Exercises     Assessment/Plan    PT Assessment Patient needs continued PT services  PT Problem List Cardiopulmonary status limiting activity;Decreased strength;Decreased activity tolerance;Decreased balance;Decreased mobility       PT Treatment Interventions DME instruction;Gait training;Stair training;Functional mobility training;Therapeutic activities;Therapeutic exercise;Balance training;Patient/family education    PT Goals (Current goals can be found in the Care Plan section)  Acute Rehab PT Goals Patient Stated Goal: Return Home PT Goal  Formulation: With patient Time For Goal Achievement: 06/06/23 Potential to Achieve Goals: Good    Frequency Min 2X/week     Co-evaluation               AM-PAC PT "6 Clicks" Mobility  Outcome Measure Help needed turning from your back to your side while in a flat bed without using bedrails?: A Little Help needed moving from lying on your back to sitting on the side of a flat bed without using bedrails?: A Little Help needed moving to and from a bed to a chair (including a wheelchair)?: A Little Help needed standing up from a chair using your arms (e.g., wheelchair or bedside chair)?: A Little Help needed to walk in hospital room?: A Little Help needed climbing 3-5 steps with a railing? : A Lot 6 Click Score: 17    End of Session Equipment Utilized During Treatment: Gait belt;Oxygen Activity Tolerance: Patient tolerated treatment well Patient left: in chair;with call bell/phone within reach;with chair alarm set Nurse Communication: Mobility status PT Visit Diagnosis: Muscle weakness (generalized) (M62.81);Unsteadiness on feet (R26.81);Difficulty in walking, not elsewhere classified (R26.2)    Time: 6195-0932 PT Time Calculation (min) (ACUTE ONLY): 20 min   Charges:   PT Evaluation $PT Eval Low Complexity: 1 Low   PT General Charges $$ ACUTE PT VISIT: 1 Visit         Cheri Guppy, PT, DPT Acute  Rehabilitation Services Office: [phone] Secure Chat Preferred  Richardson Chiquito 05/23/2023, 1:43 PM

## 2023-05-23 NOTE — Plan of Care (Signed)
   Problem: Education: Goal: Knowledge of risk factors and measures for prevention of condition will improve Outcome: Progressing   Problem: Coping: Goal: Psychosocial and spiritual needs will be supported Outcome: Progressing   Problem: Respiratory: Goal: Will maintain a patent airway Outcome: Progressing Goal: Complications related to the disease process, condition or treatment will be avoided or minimized Outcome: Progressing

## 2023-05-23 NOTE — Progress Notes (Signed)
 PROGRESS NOTE  LINDORA ALVIAR WUJ:[id number] DOB: 1950-10-02 DOA: 05/21/2023 PCP: Corliss Blacker, MD   LOS: 1 day   Brief narrative:  Wendy [name] is a 73 y.o. female with medical history significant for paroxysmal atrial fibrillation on Eliquis, chronic HFpEF, CAD s/p CABG, COPD, PAD (s/p left femorofemoral and aortobifemoral bypass), hypertension, hyperlipidemia, hypothyroidism, depression/anxiety, OSA presented to hospital with shortness of breath for 2 weeks with fatigue cough congestion, chills and bodyaches.  She was using her inhalers without improvement.  Due to persistent symptoms, she presented to the ED when she was noted to have a temperature of 99.4 F, was slightly tachypneic and pulse ox was 100% on room air.  Labs showed mild leukopenia with mild thrombocytopenia.  Chemistry was within normal limits.  Patient tested positive for COVID, influenza and RSV was negative.  Chest x-ray showed mild vascular congestion.  Patient was given albuterol inhaler and was considered for admission to the hospital for further evaluation and treatment.      Assessment/Plan: Principal Problem:   COPD with acute exacerbation (HCC) Active Problems:   COVID-19 virus infection   PAF (paroxysmal atrial fibrillation) (HCC)   (HFpEF) heart failure with preserved ejection fraction (HCC)   Coronary artery disease involving native coronary artery of native heart with angina pectoris (HCC)   Essential hypertension   HLD (hyperlipidemia)   PAD (peripheral artery disease) (HCC)   Anxiety   Severe major depression, single episode, without psychotic features (HCC)   Restless leg syndrome   Sleep apnea   Hypomagnesemia   Leukopenia   Anemia of chronic disease   Acute exacerbation of chronic obstructive pulmonary disease (COPD) (HCC)  COPD with acute exacerbation in setting of COVID-19 viral infection: Continue Brovana Pulmicort DuoNebs Solu-Medrol.  Continue to monitor closely.  Currently on  2 L of oxygen by nasal cannula.  Will continue to wean oxygen as able.  Paroxysmal atrial fibrillation: Controlled.  Not on rate controlling medication.  Continue Eliquis.   Chronic HFpEF: Appears euvolemic.  Last EF 55-60%.  Continue low-dose Lasix 20mg  daily.   Leukopenia: Without neutropenia.  This is likely secondary to viral infection.  Monitor CBC   Hypomagnesemia: Magnesium 1.5 on presentation.  Has been replenished.  Check levels in AM.   Anemia of chronic disease: Hemoglobin 8.3 on presentation.  Repeat hemoglobin today at 8.6.  Will continue to monitor.  No obvious evidence of bleeding.   CAD s/p CABG:   Continue Imdur, Ranexa, Eliquis, atorvastatin, Zetia.   Hypertension: Continue Imdur.  Latest blood pressure of 108/61.   Hyperlipidemia: Continue atorvastatin and Zetia.   Hypothyroidism: Continue Synthroid.   PAD: Continue Eliquis, atorvastatin, Zetia.   Depression/anxiety: Continue Wellbutrin, Zoloft, and Xanax prn.   Restless leg syndrome Continue Mirapex.   OSA: Patient states that she has not been using CPAP for years after her machine broke.  Debility weakness.  Will get PT OT evaluation.  DVT prophylaxis:  apixaban (ELIQUIS) tablet 5 mg   Disposition:  Uncertain at this time, likely home with home health in 1 to 2 days., patient lives with her friend at home but she does not wish her to come home if she is actively positive for COVID.  Pending PT evaluation.  Status is: Inpatient.  The patient is inpatient because: Hypoxia, COVID infection, debility, pending clinical improvement    Code Status:     Code Status: Do not attempt resuscitation (DNR) PRE-ARREST INTERVENTIONS DESIRED  Family Communication: Spoke with the patient's friend, emergency  contact on the phone and updated her about the clinical condition of the patient.  Consultants: None  Procedures: None  Anti-infectives:  None  Anti-infectives (From admission, onward)     None       Subjective: Today, patient was seen and examined at bedside.  Nursing reported that the patient had low blood pressure yesterday and required some IV fluid bolus.  Still complains of mild cough but shortness of breath better.  No chest pain.  Complains of generalized weakness and fatigue.    Objective: Vitals:   05/23/23 0817 05/23/23 0900  BP: (!) 108/54   Pulse: 76   Resp:  20  Temp: 97.8 F (36.6 C)   SpO2:     No intake or output data in the 24 hours ending 05/23/23 1121 Filed Weights   05/21/23 1809 05/23/23 0434  Weight: 54.4 kg 57.5 kg   Body mass index is 23.95 kg/m.   Physical Exam:  GENERAL: Patient is alert awake and oriented. Not in obvious distress.  On 3 L of oxygen by nasal cannula, appears weak and deconditioned HENT: No scleral pallor or icterus. Pupils equally reactive to light. Oral mucosa is moist NECK: is supple, no gross swelling noted. CHEST:  Diminished breath sounds bilaterally.  Expiratory wheezes mild.  Coarse breath sounds noted. CVS: S1 and S2 heard, irregular rhythm ABDOMEN: Soft, non-tender, bowel sounds are present. EXTREMITIES: No edema. CNS: Cranial nerves are intact.  Generalized weakness noted SKIN: warm and dry without rashes.  Data Review: I have personally reviewed the following laboratory data and studies,  CBC: Recent Labs  Lab 05/21/23 1827 05/22/23 0420 05/23/23 0557  WBC 3.2* 2.8* 3.0*  NEUTROABS 2.6 2.4  --   HGB 8.3* 8.6* 8.6*  HCT 28.1* 29.1* 28.8*  MCV 87.5 88.4 86.0  PLT 141* 139* 155   Basic Metabolic Panel: Recent Labs  Lab 05/21/23 1827 05/22/23 0420 05/23/23 0557  NA 136 132* 137  K 4.3 4.0 4.3  CL 104 105 105  CO2 22 20* 21*  GLUCOSE 92 119* 125*  BUN 15 13 24*  CREATININE 0.82 0.69 0.90  CALCIUM 8.0* 7.7* 8.1*  MG 1.5*  --  2.0   Liver Function Tests: Recent Labs  Lab 05/21/23 1827  AST 23  ALT 18  ALKPHOS 52  BILITOT 0.5  PROT 5.9*  ALBUMIN 2.8*   No results for input(s):  "LIPASE", "AMYLASE" in the last 168 hours. No results for input(s): "AMMONIA" in the last 168 hours. Cardiac Enzymes: No results for input(s): "CKTOTAL", "CKMB", "CKMBINDEX", "TROPONINI" in the last 168 hours. BNP (last 3 results) Recent Labs    05/21/23 1827  BNP 595.1*    ProBNP (last 3 results) No results for input(s): "PROBNP" in the last 8760 hours.  CBG: No results for input(s): "GLUCAP" in the last 168 hours. Recent Results (from the past 240 hours)  Resp panel by RT-PCR (RSV, Flu A&B, Covid) Anterior Nasal Swab     Status: Abnormal   Collection Time: 05/21/23  6:27 PM   Specimen: Anterior Nasal Swab  Result Value Ref Range Status   SARS Coronavirus 2 by RT PCR POSITIVE (A) NEGATIVE Final   Influenza A by PCR NEGATIVE NEGATIVE Final   Influenza B by PCR NEGATIVE NEGATIVE Final    Comment: (NOTE) The Xpert Xpress SARS-CoV-2/FLU/RSV plus assay is intended as an aid in the diagnosis of influenza from Nasopharyngeal swab specimens and should not be used as a sole basis for treatment. Nasal washings  and aspirates are unacceptable for Xpert Xpress SARS-CoV-2/FLU/RSV testing.  Fact Sheet for Patients: BloggerCourse.com  Fact Sheet for Healthcare Providers: SeriousBroker.it  This test is not yet approved or cleared by the [place] FDA and has been authorized for detection and/or diagnosis of SARS-CoV-2 by FDA under an Emergency Use Authorization (EUA). This EUA will remain in effect (meaning this test can be used) for the duration of the COVID-19 declaration under Section 564(b)(1) of the Act, 21 U.S.C. section 360bbb-3(b)(1), unless the authorization is terminated or revoked.     Resp Syncytial Virus by PCR NEGATIVE NEGATIVE Final    Comment: (NOTE) Fact Sheet for Patients: BloggerCourse.com  Fact Sheet for Healthcare Providers: SeriousBroker.it  This test is  not yet approved or cleared by the [place] FDA and has been authorized for detection and/or diagnosis of SARS-CoV-2 by FDA under an Emergency Use Authorization (EUA). This EUA will remain in effect (meaning this test can be used) for the duration of the COVID-19 declaration under Section 564(b)(1) of the Act, 21 U.S.C. section 360bbb-3(b)(1), unless the authorization is terminated or revoked.  Performed at [facility], [address], [place], [place] [address]      Studies: DG Chest Portable 1 View Result Date: 05/21/2023 CLINICAL DATA:  Short of breath, labored breathing, COPD EXAM: PORTABLE CHEST 1 VIEW COMPARISON:  07/24/2022 FINDINGS: Single frontal view of the chest demonstrates a stable cardiac silhouette. Postsurgical changes from CABG are again noted. There is mild vascular congestion without airspace disease, effusion, or pneumothorax. No acute bony abnormality. Postsurgical changes of the left humerus. IMPRESSION: 1. Mild vascular congestion without overt edema. Electronically Signed   By: Sharlet Salina M.D.   On: 05/21/2023 18:56      Joycelyn Das, MD  [facility] 05/23/2023  If 7PM-7AM, please contact night-coverage

## 2023-05-24 DIAGNOSIS — J441 Chronic obstructive pulmonary disease with (acute) exacerbation: Secondary | ICD-10-CM | POA: Diagnosis not present

## 2023-05-24 LAB — CBC
HCT: 28.1 % — ABNORMAL LOW (ref 36.0–46.0)
Hemoglobin: 8.5 g/dL — ABNORMAL LOW (ref 12.0–15.0)
MCH: 25.8 pg — ABNORMAL LOW (ref 26.0–34.0)
MCHC: 30.2 g/dL (ref 30.0–36.0)
MCV: 85.4 fL (ref 80.0–100.0)
Platelets: 138 10*3/uL — ABNORMAL LOW (ref 150–400)
RBC: 3.29 MIL/uL — ABNORMAL LOW (ref 3.87–5.11)
RDW: 16.6 % — ABNORMAL HIGH (ref 11.5–15.5)
WBC: 5 10*3/uL (ref 4.0–10.5)
nRBC: 0 % (ref 0.0–0.2)

## 2023-05-24 LAB — BASIC METABOLIC PANEL
Anion gap: 5 (ref 5–15)
BUN: 25 mg/dL — ABNORMAL HIGH (ref 8–23)
CO2: 25 mmol/L (ref 22–32)
Calcium: 7.9 mg/dL — ABNORMAL LOW (ref 8.9–10.3)
Chloride: 104 mmol/L (ref 98–111)
Creatinine, Ser: 0.88 mg/dL (ref 0.44–1.00)
GFR, Estimated: >60 mL/min (ref >60)
Glucose, Bld: 152 mg/dL — ABNORMAL HIGH (ref 70–99)
Potassium: 4.7 mmol/L (ref 3.5–5.1)
Sodium: 134 mmol/L — ABNORMAL LOW (ref 135–145)

## 2023-05-24 MED ORDER — METHYLPREDNISOLONE SODIUM SUCC 40 MG IJ SOLR
40.0000 mg | Freq: Every day | INTRAMUSCULAR | Status: DC
  Administered 2023-05-25 – 2023-05-26 (×2): 40 mg via INTRAVENOUS
  Filled 2023-05-24 (×2): qty 1

## 2023-05-24 NOTE — Progress Notes (Signed)
 Mobility Specialist Progress Note;   05/24/23 1430  Mobility  Activity Dangled on edge of bed  Level of Assistance Contact guard assist, steadying assist  Assistive Device None  Activity Response Tolerated fair  Mobility Referral Yes  Mobility visit 1 Mobility  Mobility Specialist Start Time (ACUTE ONLY) 1430  Mobility Specialist Stop Time (ACUTE ONLY) 1438  Mobility Specialist Time Calculation (min) (ACUTE ONLY) 8 min   Attempted to see pt this AM, pt told MS to come back later. Checked on pt again this afternoon, pt agreeable to mobility although hard to arouse and stated she was sleepy. Required MinG assistance to get pt sitting on EOB. Once sitting up, pt began coughing heavily. Encouraged pt to sit up or mobilize in room, however pt still stating she is sleepy and requested to lay back down in bed. Pt left in bed with all needs met.   Caesar Bookman Mobility Specialist Please contact via SecureChat or [employer] [phone]

## 2023-05-24 NOTE — Progress Notes (Addendum)
 PROGRESS NOTE  Wendy [name] ZOX:[id number] DOB: October 01, 1950 DOA: 05/21/2023 PCP: Corliss Blacker, MD   LOS: 2 days   Brief narrative:  Wendy [name] is a 73 y.o. female with medical history significant for paroxysmal atrial fibrillation on Eliquis, chronic HFpEF, CAD s/p CABG, COPD, PAD (s/p left femorofemoral and aortobifemoral bypass), hypertension, hyperlipidemia, hypothyroidism, depression/anxiety, OSA presented to hospital with shortness of breath for 2 weeks with fatigue cough congestion, chills and bodyaches.  She was using her inhalers without improvement.  Due to persistent symptoms, she presented to the ED when she was noted to have a temperature of 99.4 F, was slightly tachypneic and pulse ox was 100% on room air.  Labs showed mild leukopenia with mild thrombocytopenia.  Chemistry was within normal limits.  Patient tested positive for COVID, influenza and RSV was negative.  Chest x-ray showed mild vascular congestion.  Patient was given albuterol inhaler and was considered for admission to the hospital for further evaluation and treatment.      Assessment/Plan: Principal Problem:   COPD with acute exacerbation (HCC) Active Problems:   COVID-19 virus infection   PAF (paroxysmal atrial fibrillation) (HCC)   (HFpEF) heart failure with preserved ejection fraction (HCC)   Coronary artery disease involving native coronary artery of native heart with angina pectoris (HCC)   Essential hypertension   HLD (hyperlipidemia)   PAD (peripheral artery disease) (HCC)   Anxiety   Severe major depression, single episode, without psychotic features (HCC)   Restless leg syndrome   Sleep apnea   Hypomagnesemia   Leukopenia   Anemia of chronic disease   Acute exacerbation of chronic obstructive pulmonary disease (COPD) (HCC)  COPD with acute exacerbation in setting of COVID-19 viral infection: Continue Brovana, Pulmicort, DuoNebs Solu-Medrol.  Continue to monitor closely.  Currently  on 2 L of oxygen by nasal cannula.  Will continue to wean oxygen as able.  Decrease IV Solu-Medrol to 40 daily from 40 mg twice daily.  Plan for prednisone taper on discharge.  Paroxysmal atrial fibrillation: Controlled.  Not on rate controlling medication.  Continue Eliquis.   Chronic HFpEF: Appears euvolemic.  Last EF 55-60%.  Continue low-dose Lasix 20mg  daily.   Leukopenia: Resolved at this time.  Check CBC in AM.   Hypomagnesemia: Improved after replacement.  Latest magnesium of 2.0   Anemia of chronic disease: Hemoglobin 8.3 on presentation.  Repeat hemoglobin today at 8.5 and is at baseline.   CAD s/p CABG:   Continue Imdur, Ranexa, Eliquis, atorvastatin, Zetia.   Hypertension: Continue Imdur.  Latest blood pressure of 108/61.   Hyperlipidemia: Continue atorvastatin and Zetia.   Hypothyroidism: Continue Synthroid.   PAD: Continue Eliquis, atorvastatin, Zetia.   Depression/anxiety: Continue Wellbutrin, Zoloft, and Xanax prn.   Restless leg syndrome Continue Mirapex.   OSA: Patient states that she has not been using CPAP for years after her machine broke.  Will need to keep sleep study as outpatient  Debility weakness.  PT OT recommends home health on discharge.  DVT prophylaxis:  apixaban (ELIQUIS) tablet 5 mg   Disposition:  With home health likely 05/25/2023  Status is: Inpatient.  The patient is inpatient because: Hypoxia, COVID infection, debility, pending clinical improvement    Code Status:     Code Status: Do not attempt resuscitation (DNR) PRE-ARREST INTERVENTIONS DESIRED  Family Communication:  Spoke with the patient's friend, emergency contact on the phone on 05/23/2023  Consultants: None  Procedures: None  Anti-infectives:  None  Anti-infectives (From admission, onward)  None       Subjective: Today, patient was seen and examined at bedside.  Patient is states that she still has some cough with chest discomfort and dry  cough.  Feels weak and deconditioned.  No nausea vomiting or fever.    Objective: Vitals:   05/23/23 2135 05/24/23 0330  BP: 107/61 (!) 111/57  Pulse: 86 78  Resp: 20 20  Temp:  98.4 F (36.9 C)  SpO2: 96% 98%    Intake/Output Summary (Last 24 hours) at 05/24/2023 1255 Last data filed at 05/24/2023 0333 Gross per 24 hour  Intake 600 ml  Output 1500 ml  Net -900 ml   Filed Weights   05/21/23 1809 05/23/23 0434 05/24/23 0500  Weight: 54.4 kg 57.5 kg 57.7 kg   Body mass index is 24.04 kg/m.   Physical Exam:  GENERAL: Patient is alert awake and oriented. Not in obvious distress.  On 2 L of oxygen by nasal cannula, appears weak and deconditioned HENT: No scleral pallor or icterus. Pupils equally reactive to light. Oral mucosa is moist NECK: is supple, no gross swelling noted. CHEST:  Diminished breath sounds bilaterally.  Coarse respirations noted. CVS: S1 and S2 heard, irregular rhythm ABDOMEN: Soft, non-tender, bowel sounds are present. EXTREMITIES: No edema. CNS: Cranial nerves are intact.  Generalized weakness noted SKIN: warm and dry without rashes.  Data Review: I have personally reviewed the following laboratory data and studies,  CBC: Recent Labs  Lab 05/21/23 1827 05/22/23 0420 05/23/23 0557 05/24/23 0357  WBC 3.2* 2.8* 3.0* 5.0  NEUTROABS 2.6 2.4  --   --   HGB 8.3* 8.6* 8.6* 8.5*  HCT 28.1* 29.1* 28.8* 28.1*  MCV 87.5 88.4 86.0 85.4  PLT 141* 139* 155 138*   Basic Metabolic Panel: Recent Labs  Lab 05/21/23 1827 05/22/23 0420 05/23/23 0557 05/24/23 0357  NA 136 132* 137 134*  K 4.3 4.0 4.3 4.7  CL 104 105 105 104  CO2 22 20* 21* 25  GLUCOSE 92 119* 125* 152*  BUN 15 13 24* 25*  CREATININE 0.82 0.69 0.90 0.88  CALCIUM 8.0* 7.7* 8.1* 7.9*  MG 1.5*  --  2.0  --    Liver Function Tests: Recent Labs  Lab 05/21/23 1827  AST 23  ALT 18  ALKPHOS 52  BILITOT 0.5  PROT 5.9*  ALBUMIN 2.8*   No results for input(s): "LIPASE", "AMYLASE" in the  last 168 hours. No results for input(s): "AMMONIA" in the last 168 hours. Cardiac Enzymes: No results for input(s): "CKTOTAL", "CKMB", "CKMBINDEX", "TROPONINI" in the last 168 hours. BNP (last 3 results) Recent Labs    05/21/23 1827  BNP 595.1*    ProBNP (last 3 results) No results for input(s): "PROBNP" in the last 8760 hours.  CBG: No results for input(s): "GLUCAP" in the last 168 hours. Recent Results (from the past 240 hours)  Resp panel by RT-PCR (RSV, Flu A&B, Covid) Anterior Nasal Swab     Status: Abnormal   Collection Time: 05/21/23  6:27 PM   Specimen: Anterior Nasal Swab  Result Value Ref Range Status   SARS Coronavirus 2 by RT PCR POSITIVE (A) NEGATIVE Final   Influenza A by PCR NEGATIVE NEGATIVE Final   Influenza B by PCR NEGATIVE NEGATIVE Final    Comment: (NOTE) The Xpert Xpress SARS-CoV-2/FLU/RSV plus assay is intended as an aid in the diagnosis of influenza from Nasopharyngeal swab specimens and should not be used as a sole basis for treatment. Nasal washings and  aspirates are unacceptable for Xpert Xpress SARS-CoV-2/FLU/RSV testing.  Fact Sheet for Patients: BloggerCourse.com  Fact Sheet for Healthcare Providers: SeriousBroker.it  This test is not yet approved or cleared by the [place] FDA and has been authorized for detection and/or diagnosis of SARS-CoV-2 by FDA under an Emergency Use Authorization (EUA). This EUA will remain in effect (meaning this test can be used) for the duration of the COVID-19 declaration under Section 564(b)(1) of the Act, 21 U.S.C. section 360bbb-3(b)(1), unless the authorization is terminated or revoked.     Resp Syncytial Virus by PCR NEGATIVE NEGATIVE Final    Comment: (NOTE) Fact Sheet for Patients: BloggerCourse.com  Fact Sheet for Healthcare Providers: SeriousBroker.it  This test is not yet approved or cleared  by the [place] FDA and has been authorized for detection and/or diagnosis of SARS-CoV-2 by FDA under an Emergency Use Authorization (EUA). This EUA will remain in effect (meaning this test can be used) for the duration of the COVID-19 declaration under Section 564(b)(1) of the Act, 21 U.S.C. section 360bbb-3(b)(1), unless the authorization is terminated or revoked.  Performed at [facility], [address], [place], [place] [address]      Studies: No results found.     Joycelyn Das, MD  [facility] 05/24/2023  If 7PM-7AM, please contact night-coverage

## 2023-05-25 DIAGNOSIS — J441 Chronic obstructive pulmonary disease with (acute) exacerbation: Secondary | ICD-10-CM | POA: Diagnosis not present

## 2023-05-25 LAB — MRSA NEXT GEN BY PCR, NASAL: MRSA by PCR Next Gen: DETECTED — AB

## 2023-05-25 MED ORDER — CHLORHEXIDINE GLUCONATE CLOTH 2 % EX PADS
6.0000 | MEDICATED_PAD | Freq: Every day | CUTANEOUS | Status: DC
  Administered 2023-05-26: 6 via TOPICAL

## 2023-05-25 MED ORDER — MUPIROCIN 2 % EX OINT
1.0000 | TOPICAL_OINTMENT | Freq: Two times a day (BID) | CUTANEOUS | Status: DC
  Administered 2023-05-25 – 2023-05-26 (×2): 1 via NASAL
  Filled 2023-05-25 (×2): qty 22

## 2023-05-25 NOTE — Progress Notes (Signed)
 PROGRESS NOTE  CLARESSA HUGHLEY KVQ:[id number] DOB: [birth year] DOA: 05/21/2023 PCP: Corliss Blacker, MD   LOS: 3 days   Brief narrative:  Wendy [name] is a 73 y.o. female with medical history significant for paroxysmal atrial fibrillation on Eliquis, chronic HFpEF, CAD s/p CABG, COPD, PAD (s/p left femorofemoral and aortobifemoral bypass), hypertension, hyperlipidemia, hypothyroidism, depression/anxiety, OSA presented to hospital with shortness of breath for 2 weeks with fatigue cough congestion, chills and bodyaches.  She was using her inhalers without improvement.  Due to persistent symptoms, she presented to the ED when she was noted to have a temperature of 99.4 F, was slightly tachypneic and pulse ox was 100% on room air.  Labs showed mild leukopenia with mild thrombocytopenia.  Chemistry was within normal limits.  Patient tested positive for COVID, influenza and RSV was negative.  Chest x-ray showed mild vascular congestion.  Patient was given albuterol inhaler and was considered for admission to the hospital for further evaluation and treatment.      Assessment/Plan: Principal Problem:   COPD with acute exacerbation (HCC) Active Problems:   COVID-19 virus infection   PAF (paroxysmal atrial fibrillation) (HCC)   (HFpEF) heart failure with preserved ejection fraction (HCC)   Coronary artery disease involving native coronary artery of native heart with angina pectoris (HCC)   Essential hypertension   HLD (hyperlipidemia)   PAD (peripheral artery disease) (HCC)   Anxiety   Severe major depression, single episode, without psychotic features (HCC)   Restless leg syndrome   Sleep apnea   Hypomagnesemia   Leukopenia   Anemia of chronic disease   Acute exacerbation of chronic obstructive pulmonary disease (COPD) (HCC)  COPD with acute exacerbation in setting of COVID-19 viral infection: Continue Brovana, Pulmicort, DuoNebs Solu-Medrol.  Continue to monitor closely.  Currently  on 2 L of oxygen by nasal cannula.  Will continue to wean oxygen as able.  Decreased IV Solu-Medrol to 40 mg daily from 40 mg twice daily.  Plan for prednisone taper on discharge.  Patient will likely need oxygen on discharge.  Will check for oxygen requirement in AM.  Paroxysmal atrial fibrillation: Controlled.  Not on rate controlling medication.  Continue Eliquis.   Chronic HFpEF: Appears euvolemic.  Last EF 55-60%.  Continue low-dose Lasix 20mg  daily.   Leukopenia: Resolved at this time.  Check CBC in AM.   Hypomagnesemia: Improved after replacement.  Latest magnesium of 2.0   Anemia of chronic disease: Hemoglobin 8.3 on presentation.  Repeat hemoglobin today at 8.5 and is at baseline.   CAD s/p CABG:   Continue Imdur, Ranexa, Eliquis, atorvastatin, Zetia.   Hypertension: Continue Imdur.  Latest blood pressure of 108/61.   Hyperlipidemia: Continue atorvastatin and Zetia.   Hypothyroidism: Continue Synthroid.   PAD: Continue Eliquis, atorvastatin, Zetia.   Depression/anxiety: Continue Wellbutrin, Zoloft, and Xanax prn.   Restless leg syndrome Continue Mirapex.   OSA: Patient states that she has not been using CPAP for years after her machine broke.  Will need to keep sleep study as outpatient  Debility weakness.  PT OT recommends home health on discharge.  DVT prophylaxis:  apixaban (ELIQUIS) tablet 5 mg   Disposition:  With home health likely 05/26/2023  Status is: Inpatient.  The patient is inpatient because: Hypoxia, COVID infection, debility, pending clinical improvement    Code Status:     Code Status: Do not attempt resuscitation (DNR) PRE-ARREST INTERVENTIONS DESIRED  Family Communication:  Spoke with the patient's friend, emergency contact on the phone  on 05/23/2023  Consultants: None  Procedures: None  Anti-infectives:  None  Anti-infectives (From admission, onward)    None       Subjective: Today, patient was seen and examined  at bedside.  Patient still continues to have a lot of cough congestion shortness of breath and weakness.  Does not feel quite ready for discharge yet.  No nausea vomiting fever chills or rigor.    Objective: Vitals:   05/25/23 0330 05/25/23 0721  BP: 117/64   Pulse: 67 68  Resp: (!) 21 (!) 22  Temp: 97.9 F (36.6 C)   SpO2: 98% 98%    Intake/Output Summary (Last 24 hours) at 05/25/2023 1245 Last data filed at 05/25/2023 1202 Gross per 24 hour  Intake 240 ml  Output 2400 ml  Net -2160 ml   Filed Weights   05/23/23 0434 05/24/23 0500 05/25/23 0500  Weight: 57.5 kg 57.7 kg 57.1 kg   Body mass index is 23.79 kg/m.   Physical Exam:  GENERAL: Patient is alert awake and oriented. Not in obvious distress.  On 2 L of oxygen by nasal cannula, appears weak and deconditioned HENT: No scleral pallor or icterus. Pupils equally reactive to light. Oral mucosa is moist NECK: is supple, no gross swelling noted. CHEST:  Diminished breath sounds bilaterally.,  Coarse breath sounds noted. CVS: S1 and S2 heard, irregular rhythm ABDOMEN: Soft, non-tender, bowel sounds are present. EXTREMITIES: No edema. CNS: Cranial nerves are intact.  Generalized weakness noted SKIN: warm and dry without rashes.  Data Review: I have personally reviewed the following laboratory data and studies,  CBC: Recent Labs  Lab 05/21/23 1827 05/22/23 0420 05/23/23 0557 05/24/23 0357  WBC 3.2* 2.8* 3.0* 5.0  NEUTROABS 2.6 2.4  --   --   HGB 8.3* 8.6* 8.6* 8.5*  HCT 28.1* 29.1* 28.8* 28.1*  MCV 87.5 88.4 86.0 85.4  PLT 141* 139* 155 138*   Basic Metabolic Panel: Recent Labs  Lab 05/21/23 1827 05/22/23 0420 05/23/23 0557 05/24/23 0357  NA 136 132* 137 134*  K 4.3 4.0 4.3 4.7  CL 104 105 105 104  CO2 22 20* 21* 25  GLUCOSE 92 119* 125* 152*  BUN 15 13 24* 25*  CREATININE 0.82 0.69 0.90 0.88  CALCIUM 8.0* 7.7* 8.1* 7.9*  MG 1.5*  --  2.0  --    Liver Function Tests: Recent Labs  Lab 05/21/23 1827   AST 23  ALT 18  ALKPHOS 52  BILITOT 0.5  PROT 5.9*  ALBUMIN 2.8*   No results for input(s): "LIPASE", "AMYLASE" in the last 168 hours. No results for input(s): "AMMONIA" in the last 168 hours. Cardiac Enzymes: No results for input(s): "CKTOTAL", "CKMB", "CKMBINDEX", "TROPONINI" in the last 168 hours. BNP (last 3 results) Recent Labs    05/21/23 1827  BNP 595.1*    ProBNP (last 3 results) No results for input(s): "PROBNP" in the last 8760 hours.  CBG: No results for input(s): "GLUCAP" in the last 168 hours. Recent Results (from the past 240 hours)  Resp panel by RT-PCR (RSV, Flu A&B, Covid) Anterior Nasal Swab     Status: Abnormal   Collection Time: 05/21/23  6:27 PM   Specimen: Anterior Nasal Swab  Result Value Ref Range Status   SARS Coronavirus 2 by RT PCR POSITIVE (A) NEGATIVE Final   Influenza A by PCR NEGATIVE NEGATIVE Final   Influenza B by PCR NEGATIVE NEGATIVE Final    Comment: (NOTE) The Xpert Xpress SARS-CoV-2/FLU/RSV plus assay  is intended as an aid in the diagnosis of influenza from Nasopharyngeal swab specimens and should not be used as a sole basis for treatment. Nasal washings and aspirates are unacceptable for Xpert Xpress SARS-CoV-2/FLU/RSV testing.  Fact Sheet for Patients: BloggerCourse.com  Fact Sheet for Healthcare Providers: SeriousBroker.it  This test is not yet approved or cleared by the [place] FDA and has been authorized for detection and/or diagnosis of SARS-CoV-2 by FDA under an Emergency Use Authorization (EUA). This EUA will remain in effect (meaning this test can be used) for the duration of the COVID-19 declaration under Section 564(b)(1) of the Act, 21 U.S.C. section 360bbb-3(b)(1), unless the authorization is terminated or revoked.     Resp Syncytial Virus by PCR NEGATIVE NEGATIVE Final    Comment: (NOTE) Fact Sheet for  Patients: BloggerCourse.com  Fact Sheet for Healthcare Providers: SeriousBroker.it  This test is not yet approved or cleared by the [place] FDA and has been authorized for detection and/or diagnosis of SARS-CoV-2 by FDA under an Emergency Use Authorization (EUA). This EUA will remain in effect (meaning this test can be used) for the duration of the COVID-19 declaration under Section 564(b)(1) of the Act, 21 U.S.C. section 360bbb-3(b)(1), unless the authorization is terminated or revoked.  Performed at [facility], [address] [place], [place] [address]      Studies: No results found.     Joycelyn Das, MD  [facility] 05/25/2023  If 7PM-7AM, please contact night-coverage

## 2023-05-25 NOTE — Plan of Care (Signed)
°  Problem: Education: Goal: Knowledge of risk factors and measures for prevention of condition will improve Outcome: Progressing   Problem: Respiratory: Goal: Will maintain a patent airway Outcome: Progressing   Problem: Nutrition: Goal: Adequate nutrition will be maintained Outcome: Progressing

## 2023-05-25 NOTE — Progress Notes (Signed)
 Mobility Specialist Progress Note;   05/25/23 1040  Mobility  Activity Transferred from bed to chair  Level of Assistance Contact guard assist, steadying assist  Assistive Device Other (Comment) (HHA)  Distance Ambulated (ft) 5 ft  Activity Response Tolerated well  Mobility Referral Yes  Mobility visit 1 Mobility  Mobility Specialist Start Time (ACUTE ONLY) 1040  Mobility Specialist Stop Time (ACUTE ONLY) 1100  Mobility Specialist Time Calculation (min) (ACUTE ONLY) 20 min   RN requesting pt to sit up in chair. On 2LO2 upon arrival, VSS. Required MinG assistance via HHA to safely transfer pt from bed to chair. Still displaying a cough. Pt left comfortably in chair with all needs met. RN notified.   Caesar Bookman Mobility Specialist Please contact via SecureChat or [employer] [phone]

## 2023-05-25 NOTE — TOC Initial Note (Addendum)
 Transition of Care [facility]) - Initial/Assessment Note    Patient Details  Name: Wendy [name] MRN: [id number] Date of Birth: [birth year]  Transition of Care [facility]) CM/SW Contact:    Gala Lewandowsky, RN Phone Number: 05/25/2023, 10:53 AM  Clinical Narrative:  Patient presented for shortness of breath. PTA patient states she was from home alone. Patient has support of friends and has transportation to appointments and to get meds. Patient has DME cane in the home and is in need of rollator for home- agreeable to DME and wants to use [employer]. Case Manager made the referral with [employer] and DME will be delivered to the room before d/c. Patient states she does not wear oxygen in the home and does not want to become dependent on 02. Case Manager discussed home health and the patient declines HH PT- MD and Staff RN aware. No further needs identified at this time. Case Manager will continue to follow for additional needs.                Case Manager received a secure chat from the MD that patient has changed her mind for [facility] PT. Case Manager discussed the Medicare.gov list and patient did not have a preference. Patient is agreeable to services and referral was made to [facility] with [facility]. Arranged HH with [facility] for [facility] PT- orders to be written.  Expected Discharge Plan: Home/Self Care Barriers to Discharge: No Barriers Identified   Patient Goals and CMS Choice Patient states their goals for this hospitalization and ongoing recovery are:: plan to transition home once stable   Choice offered to / list presented to : NA     Expected Discharge Plan and Services   Discharge Planning Services: CM Consult Post Acute Care Choice: Durable Medical Equipment Living arrangements for the past 2 months: Single Family Home                 DME Arranged: Walker rolling with seat DME Agency: [employer] Date DME Agency Contacted: 05/25/23 Time DME Agency Contacted:  1052 Representative spoke with at DME Agency: Vaughan Basta HH Arranged: Refused HH   Prior Living Arrangements/Services Living arrangements for the past 2 months: Single Family Home Lives with:: Self Patient language and need for interpreter reviewed:: Yes Do you feel safe going back to the place where you live?: Yes      Need for Family Participation in Patient Care: No (Comment) Care giver support system in place?: No (comment)   Criminal Activity/Legal Involvement Pertinent to Current Situation/Hospitalization: No - Comment as needed  Activities of Daily Living   ADL Screening (condition at time of admission) Independently performs ADLs?: Yes (appropriate for developmental age) Is the patient deaf or have difficulty hearing?: No Does the patient have difficulty seeing, even when wearing glasses/contacts?: No Does the patient have difficulty concentrating, remembering, or making decisions?: No  Permission Sought/Granted Permission sought to share information with : Case Manager, Magazine features editor (Friends support) Permission granted to share information with : Yes, Verbal Permission Granted     Permission granted to share info w AGENCY: [employer]        Emotional Assessment Appearance:: Appears stated age Attitude/Demeanor/Rapport: Engaged Affect (typically observed): Appropriate Orientation: : Oriented to Self, Oriented to Place, Oriented to  Time, Oriented to Situation Alcohol / Substance Use: Not Applicable Psych Involvement: No (comment)  Admission diagnosis:  Acute exacerbation of chronic obstructive pulmonary disease (COPD) (HCC) [J44.1] COPD exacerbation (HCC) [J44.1] COPD with acute exacerbation (  HCC) [J44.1] Dyspnea, unspecified type [R06.00] COVID-19 [U07.1] Patient Active Problem List   Diagnosis Date Noted   Acute exacerbation of chronic obstructive pulmonary disease (COPD) (HCC) 05/22/2023   COPD with acute exacerbation (HCC) 05/21/2023   COVID-19  virus infection 05/21/2023   Leukopenia 05/21/2023   Anemia of chronic disease 05/21/2023   Altered mental status 07/25/2022   Hypotension 07/24/2022   Restless leg syndrome 07/24/2022   Sinus bradycardia 07/24/2022   Marijuana abuse 07/24/2022   Hyperkalemia 07/24/2022   Acquired thrombophilia (HCC) 02/24/2021   Allergic rhinitis 02/24/2021   (HFpEF) heart failure with preserved ejection fraction (HCC) 02/24/2021   Constipation 02/24/2021   Displacement of lumbar intervertebral disc without myelopathy 02/24/2021   Dysphagia 02/24/2021   Goiter 02/24/2021   Lumbosacral spondylosis without myelopathy 02/24/2021   Restless legs syndrome 02/24/2021   Sacroiliitis, not elsewhere classified (HCC) 02/24/2021   Sciatica 02/24/2021   Severe major depression, single episode, without psychotic features (HCC) 02/24/2021   Urinary incontinence 02/24/2021   Vitamin D deficiency 02/24/2021   Drug overdose 04/10/2020   Prolonged QT interval 09/10/2019   Hypomagnesemia 09/10/2019   Cardiogenic shock (HCC)    Aortoiliac occlusive disease (HCC) 06/18/2019   Coronary artery disease of native artery of native heart with stable angina pectoris (HCC)    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   Chronic anticoagulation 07/22/2016   Old MI (myocardial infarction) 07/22/2016   Primary osteoarthritis of right hip 06/27/2016   Avascular necrosis of hip, right (HCC) 06/27/2016   PVD (peripheral vascular disease) (HCC)    PAF (paroxysmal atrial fibrillation) (HCC) 01/21/2015   Essential hypertension 01/14/2015   Coronary artery disease involving native coronary artery of native heart with angina pectoris (HCC) 10/06/2014   PAD (peripheral artery disease) (HCC)    COPD (chronic obstructive pulmonary disease) (HCC)    Chronic back pain    Sleep apnea    Anxiety    GERD (gastroesophageal reflux disease)    Nonunion, 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 [place] [address] Phone: [phone] Fax: [phone]  [facility] [place] - [place], [place] - [address] [place] [address] Phone: [phone] Fax: [phone]  Social Drivers of Health (SDOH) Social History: SDOH Screenings   Food Insecurity: No Food Insecurity (05/23/2023)  Housing: Low Risk  (05/23/2023)  Transportation Needs: No Transportation Needs (05/23/2023)  Utilities: Not At Risk (05/23/2023)  Alcohol Screen: Low Risk  (11/08/2022)  Depression (PHQ2-9): Low Risk  (11/08/2022)  Financial Resource Strain: Low Risk  (11/08/2022)  Physical Activity: Inactive (11/08/2022)  Social Connections: Socially Isolated (05/23/2023)  Stress: Stress Concern Present (11/08/2022)  Tobacco Use: Medium Risk (05/21/2023)  Health Literacy: Adequate Health Literacy (11/08/2022)   Readmission Risk Interventions     No data to display

## 2023-05-25 NOTE — Progress Notes (Signed)
 SATURATION QUALIFICATIONS: (This note is used to comply with regulatory documentation for home oxygen)  Patient Saturations on Room Air at Rest = 96%  Patient Saturations on Room Air while Ambulating = 93%  Patient Saturations on 0 Liters of oxygen while Ambulating = 93%  Please briefly explain why patient needs home oxygen: Pt tolerated transfers, gait, and standing marches on RA. She does not require supplemental oxygen at this time to maintain SpO2 >88%.   Cheri Guppy, PT, DPT Acute Rehabilitation Services Office: [phone] Secure Chat Preferred

## 2023-05-25 NOTE — Progress Notes (Signed)
 Physical Therapy Treatment Patient Details Name: Wendy [name] MRN: [id number] DOB: 1950/04/14 Today's Date: 05/25/2023   History of Present Illness Wendy [name] is a 73 y.o. F admitted 05/21/23 with SOB, cough, congestion, chills, and body aches for past 2 weeks. SARS-CoV-2 PCR is positive. Chest x-ray showed mild vascular congestion. PMH significant for paroxysmal atrial fibrillation on Eliquis, chronic HFpEF, CAD s/p CABG, COPD, PAD (s/p left femorofemoral and aortobifemoral bypass), hypertension, hyperlipidemia, hypothyroidism, depression/anxiety, OSA.   PT Comments  Pt greeted supine in bed, asleep. She agreed to a short PT session confined to the room. Attempted to educated pt on rollator including proper use, sequencing, features, and safety but she declined. She engaged in standing marches to simulate stairs with CGA and BUE support. Pt ambulated back and forth in the room without an AD, evident unsteadiness and postural sway during ambulation. She would benefit from an AD for increased stability. Pt tolerated weaning to RA with SpO2 maintained >90% throughout session. Will continue to follow acutely and advance functional mobility appropriately.    If plan is discharge home, recommend the following: A little help with walking and/or transfers;A little help with bathing/dressing/bathroom;Assistance with cooking/housework;Help with stairs or ramp for entrance;Assist for transportation   Can travel by private vehicle        Equipment Recommendations  Rollator (4 wheels)    Recommendations for Other Services       Precautions / Restrictions Precautions Precautions: Fall Recall of Precautions/Restrictions: Intact Restrictions Weight Bearing Restrictions Per Provider Order: No     Mobility  Bed Mobility Overal bed mobility: Modified Independent Bed Mobility: Supine to Sit, Sit to Supine           General bed mobility comments: Pt performed supine<>sit with HOB elevated  and use of bedrails.    Transfers Overall transfer level: Needs assistance Equipment used: None (Pt refused use of RW or rollator) Transfers: Sit to/from Stand Sit to Stand: Supervision           General transfer comment: Pt stood from lowest bed height and commode without an AD. She pushed up with BUE from the surface she was on. Good eccentric control with sitting.    Ambulation/Gait Ambulation/Gait assistance: Contact guard assist Gait Distance (Feet): 65 Feet (Pt ambulated around the FOB, back and forth to the door, into the bathroom, and back to the opposite side of the bed.) Assistive device: None (Pt refused use of RW or rollator) Gait Pattern/deviations: Decreased stride length, Step-to pattern, Wide base of support, Drifts right/left Gait velocity: reduced Gait velocity interpretation: <1.31 ft/sec, indicative of household ambulator   General Gait Details: Pt took short slow steps with adequate foot clearence, equal weight shift, and even arm swing. She demonstrated postural sway, drifting R/L and would benefit from an AD, which she refused to use or listen to education about.   Stairs Stairs: Yes Stairs assistance: Contact guard assist Stair Management: Two rails, Forwards Number of Stairs: 10 General stair comments: Simulated stair climbing by pt performing standing marches with BUE on sink counter to act as railing.   Wheelchair Mobility     Tilt Bed    Modified Rankin (Stroke Patients Only)       Balance Overall balance assessment: Needs assistance Sitting-balance support: Single extremity supported, Feet supported Sitting balance-Leahy Scale: Good Sitting balance - Comments: Pt sat EOB and on the commode with supervision. She performed pericare independently.   Standing balance support: No upper extremity supported, Bilateral upper extremity supported  Standing balance-Leahy Scale: Fair Standing balance comment: Pt unsteady with evident postural sway,  CGA for safety, but no overt LOB.                            Communication Communication Communication: No apparent difficulties  Cognition Arousal: Alert Behavior During Therapy: WFL for tasks assessed/performed   PT - Cognitive impairments: No apparent impairments                         Following commands: Intact      Cueing Cueing Techniques: Verbal cues  Exercises      General Comments General comments (skin integrity, edema, etc.): Pt greeted on 2L O2, weaned to RA and she maintained SpO2 >90%.      Pertinent Vitals/Pain Pain Assessment Pain Assessment: No/denies pain    Home Living                          Prior Function            PT Goals (current goals can now be found in the care plan section) Acute Rehab PT Goals Patient Stated Goal: Return Home Progress towards PT goals: Progressing toward goals    Frequency    Min 2X/week      PT Plan      Co-evaluation              AM-PAC PT "6 Clicks" Mobility   Outcome Measure  Help needed turning from your back to your side while in a flat bed without using bedrails?: A Little Help needed moving from lying on your back to sitting on the side of a flat bed without using bedrails?: A Little Help needed moving to and from a bed to a chair (including a wheelchair)?: A Little Help needed standing up from a chair using your arms (e.g., wheelchair or bedside chair)?: A Little Help needed to walk in hospital room?: A Little Help needed climbing 3-5 steps with a railing? : A Little 6 Click Score: 18    End of Session Equipment Utilized During Treatment: Gait belt Activity Tolerance: Patient limited by fatigue Patient left: in bed;with call bell/phone within reach;with bed alarm set Nurse Communication: Mobility status;Other (comment) (Pt left on RA) PT Visit Diagnosis: Muscle weakness (generalized) (M62.81);Unsteadiness on feet (R26.81);Difficulty in walking, not  elsewhere classified (R26.2)     Time: 4098-1191 PT Time Calculation (min) (ACUTE ONLY): 22 min  Charges:    $Gait Training: 8-22 mins PT General Charges $$ ACUTE PT VISIT: 1 Visit                     Cheri Guppy, PT, DPT Acute Rehabilitation Services Office: [phone] Secure Chat Preferred  Richardson Chiquito 05/25/2023, 5:01 PM

## 2023-05-25 NOTE — Care Management Important Message (Signed)
 Important Message  Patient Details  Name: Wendy [name] MRN: [id number] Date of Birth: Jul 19, 1950   Important Message Given:  Yes - Medicare IM     Renie Ora 05/25/2023, 1:06 PM

## 2023-05-26 ENCOUNTER — Other Ambulatory Visit (HOSPITAL_COMMUNITY): Payer: Self-pay

## 2023-05-26 DIAGNOSIS — J441 Chronic obstructive pulmonary disease with (acute) exacerbation: Secondary | ICD-10-CM | POA: Diagnosis not present

## 2023-05-26 MED ORDER — GUAIFENESIN ER 600 MG PO TB12
600.0000 mg | ORAL_TABLET | Freq: Two times a day (BID) | ORAL | 0 refills | Status: AC
  Filled 2023-05-26: qty 20, 10d supply, fill #0

## 2023-05-26 MED ORDER — PREDNISONE 10 MG PO TABS
ORAL_TABLET | ORAL | 0 refills | Status: AC
  Filled 2023-05-26: qty 19, 7d supply, fill #0

## 2023-05-26 NOTE — Plan of Care (Signed)
   Problem: Education: Goal: Knowledge of risk factors and measures for prevention of condition will improve Outcome: Progressing   Problem: Coping: Goal: Psychosocial and spiritual needs will be supported Outcome: Progressing   Problem: Respiratory: Goal: Will maintain a patent airway Outcome: Progressing Goal: Complications related to the disease process, condition or treatment will be avoided or minimized Outcome: Progressing

## 2023-05-26 NOTE — Discharge Summary (Signed)
 Physician Discharge Summary  [name]:[id number] DOB: 04/13/1950 DOA: 05/21/2023  PCP: Corliss Blacker, MD  Admit date: 05/21/2023 Discharge date: 05/26/2023  Admitted From: Home  Discharge disposition: Home health   Recommendations for Outpatient Follow-Up:   Follow up with your primary care provider in one week.  Check CBC, BMP, magnesium in the next visit Patient will benefit from sleep study as outpatient.   Discharge Diagnosis:   Principal Problem:   COPD with acute exacerbation (HCC) Active Problems:   COVID-19 virus infection   PAF (paroxysmal atrial fibrillation) (HCC)   (HFpEF) heart failure with preserved ejection fraction (HCC)   Coronary artery disease involving native coronary artery of native heart with angina pectoris (HCC)   Essential hypertension   HLD (hyperlipidemia)   PAD (peripheral artery disease) (HCC)   Anxiety   Severe major depression, single episode, without psychotic features (HCC)   Restless leg syndrome   Sleep apnea   Hypomagnesemia   Leukopenia   Anemia of chronic disease   Acute exacerbation of chronic obstructive pulmonary disease (COPD) (HCC)   Discharge Condition: Improved.  Diet recommendation:   Regular.  Wound care: None.  Code status: Full.   History of Present Illness:   Wendy [name] is a 73 y.o. female with medical history significant for paroxysmal atrial fibrillation on Eliquis, chronic HFpEF, CAD s/p CABG, COPD, PAD (s/p left femorofemoral and aortobifemoral bypass), hypertension, hyperlipidemia, hypothyroidism, depression/anxiety, OSA presented to hospital with shortness of breath for 2 weeks with fatigue cough congestion, chills and bodyaches.  She was using her inhalers without improvement.  Due to persistent symptoms, she presented to the ED when she was noted to have a temperature of 99.4 F, was slightly tachypneic and pulse ox was 100% on room air.  Labs showed mild leukopenia with mild  thrombocytopenia.  Chemistry was within normal limits.  Patient tested positive for COVID, influenza and RSV was negative.  Chest x-ray showed mild vascular congestion.  Patient was given albuterol inhaler and was considered for admission to the hospital for further evaluation and treatment.     Hospital Course:   Following conditions were addressed during hospitalization as listed below,  COPD with acute exacerbation in setting of COVID-19 viral infection: Patient received Brovana, Pulmicort, DuoNebs Solu-Medrol.  At this time patient has been weaned off oxygen.  Will be discharged with prednisone taper on discharge.  Did not qualify for oxygen at home.   Paroxysmal atrial fibrillation: Controlled.  Not on rate controlling medication.  Continue Eliquis.   Chronic HFpEF: Appears euvolemic.  Last EF 55-60%.  Continue low-dose Lasix 20mg  daily.   Leukopenia: Resolved at this time   Hypomagnesemia: Improved after replacement.  Latest magnesium of 2.0   Anemia of chronic disease: Hemoglobin 8.3 on presentation.  Repeat hemoglobin of 8.5 and at baseline.   CAD s/p CABG:   Continue Imdur, Ranexa, Eliquis, atorvastatin, Zetia.   Hypertension: Continue Imdur.  Latest blood pressure of 130/86   Hyperlipidemia: Continue atorvastatin and Zetia.   Hypothyroidism: Continue Synthroid.   PAD: Continue Eliquis, atorvastatin, Zetia.   Depression/anxiety: Continue Wellbutrin, Zoloft, and Xanax prn.   Restless leg syndrome Continue Mirapex.   OSA: Patient states that she has not been using CPAP for years after her machine broke.  Will need to keep sleep study as outpatient   Debility weakness.  PT OT recommends home health on discharge  Disposition.  At this time, patient is stable for disposition home with outpatient PCP follow-up.  Medical Consultants:   None.  Procedures:    None Subjective:   Today, patient and examined at bedside.  Complains of generalized weakness  but no dyspnea chest pain shortness of breath.  Did not qualify for oxygen.  Discharge Exam:   Vitals:   05/25/23 2023 05/26/23 0558  BP: (!) 108/47 130/86  Pulse: 80 82  Resp: 19 20  Temp: 98 F (36.7 C) (!) 97.5 F (36.4 C)  SpO2:  97%   Vitals:   05/25/23 1925 05/25/23 1927 05/25/23 2023 05/26/23 0558  BP:   (!) 108/47 130/86  Pulse:   80 82  Resp:   19 20  Temp:   98 F (36.7 C) (!) 97.5 F (36.4 C)  TempSrc:   Oral Oral  SpO2: 96% 96%  97%  Weight:    55.5 kg  Height:        General: Alert awake, not in obvious distress, on room air, chronically ill HENT: pupils equally reacting to light,  No scleral pallor or icterus noted. Oral mucosa is moist.  Chest:  .  Diminished breath sounds bilaterally.  Coarse breath sounds noted.  CVS: S1 &S2 heard. No murmur.  Irregular rhythm. Abdomen: Soft, nontender, nondistended.  Bowel sounds are heard.   Extremities: No cyanosis, clubbing or edema.  Peripheral pulses are palpable. Psych: Alert, awake and oriented, normal mood CNS:  No cranial nerve deficits.  Generalized weakness noted Skin: Warm and dry.  No rashes noted.  The results of significant diagnostics from this hospitalization (including imaging, microbiology, ancillary and laboratory) are listed below for reference.     Diagnostic Studies:   DG Chest Portable 1 View Result Date: 05/21/2023 CLINICAL DATA:  Short of breath, labored breathing, COPD EXAM: PORTABLE CHEST 1 VIEW COMPARISON:  07/24/2022 FINDINGS: Single frontal view of the chest demonstrates a stable cardiac silhouette. Postsurgical changes from CABG are again noted. There is mild vascular congestion without airspace disease, effusion, or pneumothorax. No acute bony abnormality. Postsurgical changes of the left humerus. IMPRESSION: 1. Mild vascular congestion without overt edema. Electronically Signed   By: Sharlet Salina M.D.   On: 05/21/2023 18:56     Labs:   Basic Metabolic Panel: Recent Labs  Lab  05/21/23 1827 05/22/23 0420 05/23/23 0557 05/24/23 0357  NA 136 132* 137 134*  K 4.3 4.0 4.3 4.7  CL 104 105 105 104  CO2 22 20* 21* 25  GLUCOSE 92 119* 125* 152*  BUN 15 13 24* 25*  CREATININE 0.82 0.69 0.90 0.88  CALCIUM 8.0* 7.7* 8.1* 7.9*  MG 1.5*  --  2.0  --    GFR Estimated Creatinine Clearance: 44.2 mL/min (by C-G formula based on SCr of 0.88 mg/dL). Liver Function Tests: Recent Labs  Lab 05/21/23 1827  AST 23  ALT 18  ALKPHOS 52  BILITOT 0.5  PROT 5.9*  ALBUMIN 2.8*   No results for input(s): "LIPASE", "AMYLASE" in the last 168 hours. No results for input(s): "AMMONIA" in the last 168 hours. Coagulation profile No results for input(s): "INR", "PROTIME" in the last 168 hours.  CBC: Recent Labs  Lab 05/21/23 1827 05/22/23 0420 05/23/23 0557 05/24/23 0357  WBC 3.2* 2.8* 3.0* 5.0  NEUTROABS 2.6 2.4  --   --   HGB 8.3* 8.6* 8.6* 8.5*  HCT 28.1* 29.1* 28.8* 28.1*  MCV 87.5 88.4 86.0 85.4  PLT 141* 139* 155 138*   Cardiac Enzymes: No results for input(s): "CKTOTAL", "CKMB", "CKMBINDEX", "TROPONINI" in the last 168 hours.  BNP: Invalid input(s): "POCBNP" CBG: No results for input(s): "GLUCAP" in the last 168 hours. D-Dimer No results for input(s): "DDIMER" in the last 72 hours. Hgb A1c No results for input(s): "HGBA1C" in the last 72 hours. Lipid Profile No results for input(s): "CHOL", "HDL", "LDLCALC", "TRIG", "CHOLHDL", "LDLDIRECT" in the last 72 hours. Thyroid function studies No results for input(s): "TSH", "T4TOTAL", "T3FREE", "THYROIDAB" in the last 72 hours.  Invalid input(s): "FREET3" Anemia work up No results for input(s): "VITAMINB12", "FOLATE", "FERRITIN", "TIBC", "IRON", "RETICCTPCT" in the last 72 hours. Microbiology Recent Results (from the past 240 hours)  Resp panel by RT-PCR (RSV, Flu A&B, Covid) Anterior Nasal Swab     Status: Abnormal   Collection Time: 05/21/23  6:27 PM   Specimen: Anterior Nasal Swab  Result Value Ref Range  Status   SARS Coronavirus 2 by RT PCR POSITIVE (A) NEGATIVE Final   Influenza A by PCR NEGATIVE NEGATIVE Final   Influenza B by PCR NEGATIVE NEGATIVE Final    Comment: (NOTE) The Xpert Xpress SARS-CoV-2/FLU/RSV plus assay is intended as an aid in the diagnosis of influenza from Nasopharyngeal swab specimens and should not be used as a sole basis for treatment. Nasal washings and aspirates are unacceptable for Xpert Xpress SARS-CoV-2/FLU/RSV testing.  Fact Sheet for Patients: BloggerCourse.com  Fact Sheet for Healthcare Providers: SeriousBroker.it  This test is not yet approved or cleared by the [place] FDA and has been authorized for detection and/or diagnosis of SARS-CoV-2 by FDA under an Emergency Use Authorization (EUA). This EUA will remain in effect (meaning this test can be used) for the duration of the COVID-19 declaration under Section 564(b)(1) of the Act, 21 U.S.C. section 360bbb-3(b)(1), unless the authorization is terminated or revoked.     Resp Syncytial Virus by PCR NEGATIVE NEGATIVE Final    Comment: (NOTE) Fact Sheet for Patients: BloggerCourse.com  Fact Sheet for Healthcare Providers: SeriousBroker.it  This test is not yet approved or cleared by the [place] FDA and has been authorized for detection and/or diagnosis of SARS-CoV-2 by FDA under an Emergency Use Authorization (EUA). This EUA will remain in effect (meaning this test can be used) for the duration of the COVID-19 declaration under Section 564(b)(1) of the Act, 21 U.S.C. section 360bbb-3(b)(1), unless the authorization is terminated or revoked.  Performed at [facility], [address] [place], [place] [address]   MRSA Next Gen by PCR, Nasal     Status: Abnormal   Collection Time: 05/25/23 12:26 PM   Specimen: Nasal Mucosa; Nasal Swab  Result Value Ref Range Status   MRSA by  PCR Next Gen DETECTED (A) NOT DETECTED Final    Comment: CRITICAL RESULT CALLED TO, READ BACK BY AND VERIFIED WITH: RN Nolon Rod on [phone] @1425  by SM (NOTE) The GeneXpert MRSA Assay (FDA approved for NASAL specimens only), is one component of a comprehensive MRSA colonization surveillance program. It is not intended to diagnose MRSA infection nor to guide or monitor treatment for MRSA infections. Test performance is not FDA approved in patients less than 88 years old. Performed at [facility], [address] [place], [place] [address]      Discharge Instructions:   Discharge Instructions     Call MD for:  difficulty breathing, headache or visual disturbances   Complete by: As directed    Call MD for:  temperature >100.4   Complete by: As directed    Diet - low sodium heart healthy   Complete  by: As directed    Discharge instructions   Complete by: As directed    Follow-up with your primary care provider in 1 week.  Complete the course of prednisone.  Seek medical attention for worsening symptoms   Increase activity slowly   Complete by: As directed       Allergies as of 05/26/2023       Reactions   Benadryl [diphenhydramine] Shortness Of Breath   Penicillins Diarrhea   Did it involve swelling of the face/tongue/throat, SOB, or low BP? No Did it involve sudden or severe rash/hives, skin peeling, or any reaction on the inside of your mouth or nose? No Did you need to seek medical attention at a hospital or doctor's office? No When did it last happen?      2 months If all above answers are "NO", may proceed with cephalosporin use.   Adhesive [tape] Other (See Comments)   Burning of skin   Amoxicillin Diarrhea, Other (See Comments)   Has patient had a PCN reaction causing immediate rash, facial/tongue/throat swelling, SOB or lightheadedness with hypotension: No Has patient had a PCN reaction causing severe rash involving mucus membranes or skin necrosis: No Has  patient had a PCN reaction that required hospitalization No Has patient had a PCN reaction occurring within the last 10 years: Yes -- noted rxn as diarrhea If all of the above answers are "NO", then may proceed with Cephalosporin use.   Hydrocodone-acetaminophen Itching, Nausea And Vomiting   Cyclobenzaprine Other (See Comments)   Causes restless leg syndrome Restless syndrome   Hydrocodone Itching        Medication List     TAKE these medications    acetaminophen 500 MG tablet Commonly known as: TYLENOL Take 500-1,000 mg by mouth every 6 (six) hours as needed (for pain.).   albuterol 108 (90 Base) MCG/ACT inhaler Commonly known as: VENTOLIN HFA Inhale 2 puffs into the lungs every 6 (six) hours as needed for wheezing or shortness of breath.   albuterol (2.5 MG/3ML) 0.083% nebulizer solution Commonly known as: PROVENTIL Inhale 2.5 mg into the lungs 3 (three) times daily as needed for shortness of breath or wheezing.   ALPRAZolam 0.5 MG tablet Commonly known as: XANAX Take 0.5 mg by mouth 3 (three) times daily as needed for anxiety.   apixaban 5 MG Tabs tablet Commonly known as: Eliquis Take 1 tablet (5 mg total) by mouth 2 (two) times daily.   atorvastatin 80 MG tablet Commonly known as: LIPITOR Take 1 tablet (80 mg total) by mouth daily.   azelastine 0.05 % ophthalmic solution Commonly known as: OPTIVAR Apply 1 drop to eye 2 (two) times daily as needed (for itching).   buPROPion 150 MG 24 hr tablet Commonly known as: WELLBUTRIN XL Take 150 mg by mouth daily.   ezetimibe 10 MG tablet Commonly known as: ZETIA Take 10 mg by mouth daily.   fluticasone-salmeterol 250-50 MCG/ACT Aepb Commonly known as: ADVAIR Inhale 1 puff into the lungs 2 (two) times daily.   furosemide 20 MG tablet Commonly known as: LASIX Take 1 tablet (20 mg total) by mouth as needed for fluid or edema.   gabapentin 300 MG capsule Commonly known as: NEURONTIN Take 300 mg by mouth daily as  needed (for pain).   guaiFENesin 600 MG 12 hr tablet Commonly known as: MUCINEX Take 1 tablet (600 mg total) by mouth 2 (two) times daily for 10 days.   isosorbide mononitrate 30 MG 24 hr tablet Commonly known  as: IMDUR Take 1 tablet (30 mg total) by mouth daily.   levothyroxine 25 MCG tablet Commonly known as: SYNTHROID Take 25 mcg by mouth daily before breakfast.   nitroGLYCERIN 0.4 MG SL tablet Commonly known as: NITROSTAT Place 1 tablet (0.4 mg total) under the tongue every 5 (five) minutes as needed for chest pain.   pantoprazole 40 MG tablet Commonly known as: PROTONIX TAKE ONE TABLET BY MOUTH EVERY DAY   pramipexole 0.5 MG tablet Commonly known as: MIRAPEX Take 0.5 mg by mouth at bedtime.   predniSONE 10 MG tablet Commonly known as: DELTASONE Take 4 tablets (40 mg) daily for 2 days, then, Take 3 tablets (30 mg) daily for 2 days, then, Take 2 tablets (20 mg) daily for 2 days, then, Take 1 tablets (10 mg) daily for 1 days, then stop   ranolazine 1000 MG SR tablet Commonly known as: RANEXA Take 1 tablet (1,000 mg total) by mouth 2 (two) times daily.   sertraline 100 MG tablet Commonly known as: ZOLOFT Take 150 mg by mouth at bedtime.   Slow Release Iron 45 MG Tbcr Generic drug: Ferrous Sulfate Dried Take by mouth.   Vitamin D (Ergocalciferol) 1.25 MG (50000 UNIT) Caps capsule Commonly known as: DRISDOL Take 50,000 Units by mouth every 7 (seven) days.               Durable Medical Equipment  (From admission, onward)           Start     Ordered   05/25/23 1102  For home use only DME 4 wheeled rolling walker with seat  Once       Comments: General weakness  Question:  Patient needs a walker to treat with the following condition  Answer:  COPD (chronic obstructive pulmonary disease) (HCC)   05/25/23 1101            Follow-up Information     [facility] [employer] Follow up.   Why: Rolling Walker with Seat-to be delivered to the room Contact  information: Located in: [employer] Address: [address], [place], [place] [address] Phone: [phone]        Care, [facility] Follow up.   Why: Physical Therapy-office to call with visit times. Contact information: [address] [place] [address] [phone]         Corliss Blacker, MD Follow up in 1 week(s).   Specialty: Family Medicine Contact information: [address] [place] [address] [phone]                  Time coordinating discharge: 39 minutes  Signed:  Gabriela Irigoyen  [facility] 05/26/2023, 2:40 PM

## 2023-05-26 NOTE — Progress Notes (Signed)
 Physical Therapy Treatment Patient Details Name: Wendy [name] MRN: [id number] DOB: 07/28/1950 Today's Date: 05/26/2023   History of Present Illness Wendy [name] is a 73 y.o. F admitted 05/21/23 with SOB, cough, congestion, chills, and body aches for past 2 weeks. SARS-CoV-2 PCR is positive. Chest x-ray showed mild vascular congestion. PMH significant for paroxysmal atrial fibrillation on Eliquis, chronic HFpEF, CAD s/p CABG, COPD, PAD (s/p left femorofemoral and aortobifemoral bypass), hypertension, hyperlipidemia, hypothyroidism, depression/anxiety, OSA.    PT Comments  Explained and demonstrated safe use of rollator. She verbalized and demonstrated understanding during session. Pt increased gait distance, ambulating ~231ft using rollator with modI. Pt declined stair training in the stairwell, reporting she felt confident with the task and would have support present when she entered/exited the home. Pt is making great progress towards her acute PT goals.    If plan is discharge home, recommend the following: A little help with walking and/or transfers;A little help with bathing/dressing/bathroom;Assistance with cooking/housework;Help with stairs or ramp for entrance;Assist for transportation   Can travel by private Psychologist, clinical (4 wheels)    Recommendations for Other Services       Precautions / Restrictions Precautions Precautions: Fall Recall of Precautions/Restrictions: Intact Restrictions Weight Bearing Restrictions Per Provider Order: No     Mobility  Bed Mobility Overal bed mobility: Modified Independent             General bed mobility comments: Pt greeted standing up in room. She returned to bed to be set up to eat her meal.    Transfers Overall transfer level: Modified independent Equipment used: Rollator (4 wheels) Transfers: Sit to/from Stand Sit to Stand: Modified independent (Device/Increase time)            General transfer comment: Pt demonstrated proper sequencing and hand placement using rollator. She sat down and stood up from rollator seat with accurate use of locks. Good eccentric control.    Ambulation/Gait Ambulation/Gait assistance: Modified independent (Device/Increase time) Gait Distance (Feet): 200 Feet Assistive device: Rollator (4 wheels) Gait Pattern/deviations: Step-through pattern, Decreased stride length, Trunk flexed Gait velocity: WFL Gait velocity interpretation: 1.31 - 2.62 ft/sec, indicative of limited community ambulator   General Gait Details: Pt ambulated with a reciprocal gait pattern slight fwd flex towards rollator. Cued pt to go closer to rollator and maintain upright posture. She navigated obstacles in the hallway and safely turned R/L.   Stairs Stairs:  (Pt declined stair training in stairwell.)           Wheelchair Mobility     Tilt Bed    Modified Rankin (Stroke Patients Only)       Balance Overall balance assessment: Mild deficits observed, not formally tested                                          Communication Communication Communication: No apparent difficulties  Cognition Arousal: Alert Behavior During Therapy: WFL for tasks assessed/performed   PT - Cognitive impairments: No apparent impairments                         Following commands: Intact      Cueing Cueing Techniques: Verbal cues  Exercises      General Comments General comments (skin integrity, edema, etc.): VSS on RA. Pt c/o DOE during  prolonged gait, cued her to use PLB techniques and rest on rollator seat prn.      Pertinent Vitals/Pain Pain Assessment Pain Assessment: No/denies pain    Home Living                          Prior Function            PT Goals (current goals can now be found in the care plan section) Acute Rehab PT Goals Patient Stated Goal: Return Home Progress towards PT goals: Progressing  toward goals    Frequency    Min 2X/week      PT Plan      Co-evaluation              AM-PAC PT "6 Clicks" Mobility   Outcome Measure  Help needed turning from your back to your side while in a flat bed without using bedrails?: None Help needed moving from lying on your back to sitting on the side of a flat bed without using bedrails?: None Help needed moving to and from a bed to a chair (including a wheelchair)?: None Help needed standing up from a chair using your arms (e.g., wheelchair or bedside chair)?: None Help needed to walk in hospital room?: None Help needed climbing 3-5 steps with a railing? : A Little 6 Click Score: 23    End of Session   Activity Tolerance: Patient tolerated treatment well Patient left: in bed;with call bell/phone within reach Nurse Communication: Mobility status PT Visit Diagnosis: Muscle weakness (generalized) (M62.81);Unsteadiness on feet (R26.81);Difficulty in walking, not elsewhere classified (R26.2)     Time: 0865-7846 PT Time Calculation (min) (ACUTE ONLY): 10 min  Charges:    $Gait Training: 8-22 mins PT General Charges $$ ACUTE PT VISIT: 1 Visit                     Wendy [name], PT, DPT Acute Rehabilitation Services Office: [phone] Secure Chat Preferred  Wendy [name] 05/26/2023, 12:33 PM

## 2023-06-26 ENCOUNTER — Other Ambulatory Visit: Payer: Self-pay | Admitting: Cardiovascular Disease

## 2023-10-23 ENCOUNTER — Other Ambulatory Visit: Payer: Self-pay | Admitting: Physician Assistant

## 2023-11-16 ENCOUNTER — Other Ambulatory Visit: Payer: Self-pay

## 2023-11-16 MED ORDER — APIXABAN 5 MG PO TABS: 5.0000 mg | ORAL_TABLET | Freq: Two times a day (BID) | ORAL | 3 refills | Status: AC

## 2023-11-16 NOTE — Telephone Encounter (Signed)
 Prescription refill request for Eliquis  received. Indication:afib Last office visit:8/25 Scr:0.88  3/25 Age: 73 Weight:55.5  kg  Prescription refilled

## 2024-01-23 ENCOUNTER — Other Ambulatory Visit: Payer: Self-pay
